# Patient Record
Sex: Female | Born: 1935 | Race: White | Hispanic: No
Health system: Southern US, Community
[De-identification: ages and names within clinical notes are randomized; demographics above are authoritative.]

## PROBLEM LIST (undated history)

## (undated) DIAGNOSIS — E1142 Type 2 diabetes mellitus with diabetic polyneuropathy: Secondary | ICD-10-CM

## (undated) DIAGNOSIS — K626 Ulcer of anus and rectum: Secondary | ICD-10-CM

## (undated) DIAGNOSIS — K635 Polyp of colon: Secondary | ICD-10-CM

## (undated) DIAGNOSIS — K5730 Diverticulosis of large intestine without perforation or abscess without bleeding: Secondary | ICD-10-CM

## (undated) DIAGNOSIS — B079 Viral wart, unspecified: Secondary | ICD-10-CM

## (undated) DIAGNOSIS — N393 Stress incontinence (female) (male): Secondary | ICD-10-CM

## (undated) DIAGNOSIS — R413 Other amnesia: Secondary | ICD-10-CM

## (undated) DIAGNOSIS — F411 Generalized anxiety disorder: Secondary | ICD-10-CM

## (undated) DIAGNOSIS — E1149 Type 2 diabetes mellitus with other diabetic neurological complication: Secondary | ICD-10-CM

## (undated) DIAGNOSIS — J309 Allergic rhinitis, unspecified: Secondary | ICD-10-CM

## (undated) DIAGNOSIS — N3010 Interstitial cystitis (chronic) without hematuria: Secondary | ICD-10-CM

## (undated) DIAGNOSIS — G4700 Insomnia, unspecified: Secondary | ICD-10-CM

## (undated) DIAGNOSIS — K449 Diaphragmatic hernia without obstruction or gangrene: Secondary | ICD-10-CM

## (undated) DIAGNOSIS — M1990 Unspecified osteoarthritis, unspecified site: Secondary | ICD-10-CM

## (undated) DIAGNOSIS — C4490 Unspecified malignant neoplasm of skin, unspecified: Secondary | ICD-10-CM

## (undated) DIAGNOSIS — N950 Postmenopausal bleeding: Secondary | ICD-10-CM

## (undated) DIAGNOSIS — I10 Essential (primary) hypertension: Secondary | ICD-10-CM

## (undated) DIAGNOSIS — E785 Hyperlipidemia, unspecified: Secondary | ICD-10-CM

## (undated) DIAGNOSIS — K589 Irritable bowel syndrome without diarrhea: Secondary | ICD-10-CM

## (undated) DIAGNOSIS — R809 Proteinuria, unspecified: Secondary | ICD-10-CM

## (undated) DIAGNOSIS — I4949 Other premature depolarization: Secondary | ICD-10-CM

## (undated) DIAGNOSIS — I639 Cerebral infarction, unspecified: Secondary | ICD-10-CM

## (undated) HISTORY — DX: Type 2 diabetes mellitus with other diabetic neurological complication: E11.49

## (undated) HISTORY — DX: Insomnia, unspecified: G47.00

## (undated) HISTORY — DX: Type 2 diabetes mellitus with diabetic polyneuropathy: E11.42

## (undated) HISTORY — DX: Essential (primary) hypertension: I10

## (undated) HISTORY — DX: Interstitial cystitis (chronic) without hematuria: N30.10

## (undated) HISTORY — DX: Other amnesia: R41.3

## (undated) HISTORY — DX: Generalized anxiety disorder: F41.1

## (undated) HISTORY — DX: Proteinuria, unspecified: R80.9

## (undated) HISTORY — DX: Unspecified malignant neoplasm of skin, unspecified: C44.90

## (undated) HISTORY — DX: Unspecified osteoarthritis, unspecified site: M19.90

## (undated) HISTORY — DX: Allergic rhinitis, unspecified: J30.9

## (undated) HISTORY — DX: Viral wart, unspecified: B07.9

## (undated) HISTORY — DX: Diverticulosis of large intestine without perforation or abscess without bleeding: K57.30

## (undated) HISTORY — DX: Stress incontinence (female) (male): N39.3

## (undated) HISTORY — DX: Postmenopausal bleeding: N95.0

## (undated) HISTORY — DX: Polyp of colon: K63.5

## (undated) HISTORY — DX: Diaphragmatic hernia without obstruction or gangrene: K44.9

## (undated) HISTORY — DX: Hyperlipidemia, unspecified: E78.5

## (undated) HISTORY — DX: Irritable bowel syndrome, unspecified: K58.9

## (undated) HISTORY — DX: Ulcer of anus and rectum: K62.6

## (undated) HISTORY — DX: Other premature depolarization: I49.49

## (undated) HISTORY — PX: TRIGGER FINGER RELEASE: SHX641

## (undated) HISTORY — PX: OTHER SURGICAL HISTORY: SHX169

## (undated) HISTORY — DX: Cerebral infarction, unspecified: I63.9

---

## 1963-04-02 HISTORY — PX: APPENDECTOMY: SHX54

## 1975-04-02 HISTORY — PX: ABDOMINAL HYSTERECTOMY: SHX81

## 1985-04-01 HISTORY — PX: CYSTOSCOPY: SUR368

## 1988-04-01 HISTORY — PX: TRIGGER FINGER RELEASE: SHX641

## 1998-02-01 ENCOUNTER — Ambulatory Visit (HOSPITAL_COMMUNITY): Admission: RE | Admit: 1998-02-01 | Discharge: 1998-02-01 | Payer: Self-pay | Admitting: Gynecology

## 1998-02-01 ENCOUNTER — Encounter: Payer: Self-pay | Admitting: Gynecology

## 1998-02-06 ENCOUNTER — Ambulatory Visit (HOSPITAL_COMMUNITY): Admission: RE | Admit: 1998-02-06 | Discharge: 1998-02-06 | Payer: Self-pay | Admitting: Gynecology

## 1998-02-06 ENCOUNTER — Encounter: Payer: Self-pay | Admitting: Gynecology

## 1999-04-10 ENCOUNTER — Other Ambulatory Visit: Admission: RE | Admit: 1999-04-10 | Discharge: 1999-04-10 | Payer: Self-pay | Admitting: Gynecology

## 1999-12-25 ENCOUNTER — Encounter: Admission: RE | Admit: 1999-12-25 | Discharge: 1999-12-25 | Payer: Self-pay | Admitting: Gynecology

## 1999-12-25 ENCOUNTER — Encounter: Payer: Self-pay | Admitting: Gynecology

## 2000-01-13 ENCOUNTER — Encounter: Payer: Self-pay | Admitting: Emergency Medicine

## 2000-01-13 ENCOUNTER — Inpatient Hospital Stay (HOSPITAL_COMMUNITY): Admission: EM | Admit: 2000-01-13 | Discharge: 2000-01-15 | Payer: Self-pay | Admitting: Emergency Medicine

## 2000-01-14 ENCOUNTER — Encounter: Payer: Self-pay | Admitting: *Deleted

## 2000-04-11 ENCOUNTER — Other Ambulatory Visit: Admission: RE | Admit: 2000-04-11 | Discharge: 2000-04-11 | Payer: Self-pay | Admitting: Gynecology

## 2001-01-08 ENCOUNTER — Ambulatory Visit (HOSPITAL_COMMUNITY): Admission: RE | Admit: 2001-01-08 | Discharge: 2001-01-08 | Payer: Self-pay | Admitting: Gynecology

## 2001-01-08 ENCOUNTER — Encounter: Payer: Self-pay | Admitting: Gynecology

## 2001-06-02 ENCOUNTER — Other Ambulatory Visit: Admission: RE | Admit: 2001-06-02 | Discharge: 2001-06-02 | Payer: Self-pay | Admitting: Gynecology

## 2001-08-20 ENCOUNTER — Ambulatory Visit: Admission: RE | Admit: 2001-08-20 | Discharge: 2001-08-20 | Payer: Self-pay | Admitting: Internal Medicine

## 2002-06-29 ENCOUNTER — Encounter: Payer: Self-pay | Admitting: Gynecology

## 2002-06-29 ENCOUNTER — Ambulatory Visit (HOSPITAL_COMMUNITY): Admission: RE | Admit: 2002-06-29 | Discharge: 2002-06-29 | Payer: Self-pay | Admitting: Gynecology

## 2003-12-21 ENCOUNTER — Ambulatory Visit (HOSPITAL_COMMUNITY): Admission: RE | Admit: 2003-12-21 | Discharge: 2003-12-21 | Payer: Self-pay | Admitting: Gynecology

## 2004-02-15 ENCOUNTER — Other Ambulatory Visit: Admission: RE | Admit: 2004-02-15 | Discharge: 2004-02-15 | Payer: Self-pay | Admitting: Gynecology

## 2004-07-17 ENCOUNTER — Ambulatory Visit (HOSPITAL_COMMUNITY): Admission: RE | Admit: 2004-07-17 | Discharge: 2004-07-17 | Payer: Self-pay | Admitting: Gastroenterology

## 2004-07-17 HISTORY — PX: COLONOSCOPY: SHX5424

## 2005-07-16 ENCOUNTER — Ambulatory Visit (HOSPITAL_COMMUNITY): Admission: RE | Admit: 2005-07-16 | Discharge: 2005-07-16 | Payer: Self-pay | Admitting: Gynecology

## 2006-02-10 HISTORY — PX: CATARACT EXTRACTION, BILATERAL: SHX1313

## 2007-01-14 ENCOUNTER — Ambulatory Visit (HOSPITAL_COMMUNITY): Admission: RE | Admit: 2007-01-14 | Discharge: 2007-01-14 | Payer: Self-pay | Admitting: Gynecology

## 2008-02-03 ENCOUNTER — Ambulatory Visit (HOSPITAL_COMMUNITY): Admission: RE | Admit: 2008-02-03 | Discharge: 2008-02-03 | Payer: Self-pay | Admitting: Gynecology

## 2009-03-09 ENCOUNTER — Ambulatory Visit (HOSPITAL_COMMUNITY): Admission: RE | Admit: 2009-03-09 | Discharge: 2009-03-09 | Payer: Self-pay | Admitting: Gynecology

## 2009-05-17 ENCOUNTER — Encounter: Admission: RE | Admit: 2009-05-17 | Discharge: 2009-05-17 | Payer: Self-pay | Admitting: Internal Medicine

## 2009-07-24 LAB — HM DEXA SCAN

## 2010-03-03 LAB — HM MAMMOGRAPHY: HM Mammogram: NEGATIVE

## 2010-04-09 ENCOUNTER — Ambulatory Visit (HOSPITAL_COMMUNITY)
Admission: RE | Admit: 2010-04-09 | Discharge: 2010-04-09 | Payer: Self-pay | Source: Home / Self Care | Attending: Gynecology | Admitting: Gynecology

## 2010-08-17 NOTE — Discharge Summary (Signed)
[place]  Patient:    Penny Miller, Penny Miller                       MRN: [id number] Adm. Date:  [id number] Disc. Date: [id number] Attending:  Cyndee Brightly Dictator:   Gildardo Griffes. Rogue Bussing, N.P. CC:         Orson Eva, M.D., neurology  Viviann Spare. Nyoka Cowden, M.D., Int. Med., [facility]   Discharge Summary  DATE OF BIRTH:  [birth year].  HISTORY OF PRESENT ILLNESS:  This 77 year old elderly female resides at home and is followed in primary care by Viviann Spare. Nyoka Cowden, M.D.  She was admitted with signs and symptoms of TIA.  The patient states she has a history of migraine headaches with aura presentation.  She initially thought she was having a migraine, seeing some "wiggly lines" and colors.  The patient states she realized that this was different when she experienced loss of vision in her left eye.  She also had numbness and loss of sensation to the left hand. The patient did take her regular Inderal 10 mg when she first noted visual disturbances.  The patient had previously been on Norvasc 5 mg daily but this was stopped approximately six months ago as her blood pressure had been low. The patient states she developed no headache and had no chest pain.  Also no shortness of breath.  There was no nausea or vomiting.  No incontinence of urine or stool.  ALLERGIES:   CODEINE and CIPRO.  VACCINES:  The patient had flu shot on the day of admission.  PAST MEDICAL HISTORY:  1. Hypertension.  2. Sinus headaches as youth.  3. Diagnosed migraine headaches at age 89 approximately.  This presentation     with aura.  4. Cystitis.  5. Hyperlipidemia.  6. Tobacco abuse.  7. Hysterectomy and bilateral oophorectomy for endometriosis in 1977.  8. Appendectomy.  9. Right knee arthroscopic surgery. 10. Tonsillectomy. 11. History of floaters.  MEDICATIONS ON ADMISSION: 1. Inderal 10 mg twice daily. 2. Norvasc discontinued approximately six  months ago. 3. Premarin 0.625 mg daily. 4. Lipitor 10 mg daily.  FAMILY HISTORY:  This was not obtained at the time of admission.  SOCIAL HISTORY:  The patient is married and her husband is often ill.  The patient is a retired Research scientist (physical sciences).  She smokes approximately one pack daily. No alcohol abuse.  REVIEW OF SYSTEMS:  As under history of present illness.  The patient states she has had increased anxiety over recent stressors.  Some sleep disturbances.  PHYSICAL EXAMINATION:  VITAL SIGNS:  Admission temperature afebrile.  Pulse 70, respiratory rate 14, blood pressure 180/92.  She was given clonidine with blood pressure reduced to 140/67.  HEENT:  Head was normocephalic and atraumatic.  No fascial asymmetry noted. Eyes:  PERRLA with extraocular movements intact.  NECK:  No jugular venous distension.  LUNGS:  Breath sounds are clear and equal.  CARDIOVASCULAR:  Regular rate and rhythm without murmur.  ABDOMEN:  There are positive bowel sounds with no palpable masses.  No pain.  BACK:  There is no CVA tenderness.  EXTREMITIES:  There is no clubbing, cyanosis, or edema.  SKIN:  There are some age spots visualized.  NEUROLOGICAL:  Strength 5/5 and equal in all extremities.  Sensation intact to left upper and lower extremities as well as left face.  ADMISSION LABS:  Blood gas January 12, 2000, revealed pH 7.389, pCO2  42.7, bicarb 26.0, total CO2 27.0.  CBC on January 12, 2000, revealed wbc elevated 13.3, rbc 4.45, hemoglobin 14.8, hematocrit 41.2, platelets 227.  Differential was unremarkable. Coagulation studies revealed PT 12.3, INR 0.9, PTT 30. Metabolic panel found all values normal.  Lipid profile found triglyceride high at 237 and all other values normal.  Urine culture was obtained at the time of admission.  CT scan of the head was negative for acute abnormalities.  HOSPITAL COURSE: The patient was placed on a heparin drip empirically.  She was seen in neurology  consult.  They agreed with heparin and recommended MIR/MRA of the brain as well as carotid Doppler study.  Carotid Doppler was done on January 14, 2000, finding no ICA stenosis bilaterally and vertebral artery flow antegrade bilaterally.  MRI of the brain indicated mild chronic ischemic changes in the white matter with no acute findings.  MRA found no intracranial stenosis.  Final neurology note recommends staying on Norvasc, antihypertensives, and initiating aspirin therapy.  At discharge, blood pressure was quite stable at 118/62.  CONDITION ON DISCHARGE:  Stable.  All above mentioned neurologic abnormalities seen on presentation per patient report have resolved.  DISCHARGE LABS:  PTT elevated 66 on heparin protocol.  CBC found wbc 10.3, hemoglobin 13.0, hematocrit 37.3, platelets 189.  Sed rate was normal at 1. Lipid panel revealed total cholesterol 181, triglycerides high at 237, HDL 49, LDL 85.  Urine culture reports no specimen.  DISCHARGE MEDICATIONS: 1. Norvasc 5 mg daily. 2. Lipitor 10 mg daily. 3. Premarin 0.625 mg daily. 4. Inderal 10 mg twice daily. 5. Enteric coated aspirin 325 mg daily.  DISCHARGE DIAGNOSES: 1. Neurologic changes including left eye blindness and decreased sensation    and function left hand and face secondary to either transient ischemic    attack or a presentation premigraine resolved. 2. Hypertension. 3. Hyperlipidemia, stable.  SPECIAL DISCHARGE INSTRUCTIONS:  The patient will require follow-up appointment with Dr. Jeanmarie Hubert two to four weeks post discharge.  Should have reevaluation of blood pressure control at that time. DD:  01/15/00 TD:  01/15/00 Job: [id number]

## 2010-08-17 NOTE — Op Note (Signed)
Penny Miller, Penny Miller                ACCOUNT NO.:  [id number]   MEDICAL RECORD NO.:  [id number]          PATIENT TYPE:  AMB   LOCATION:  ENDO                         FACILITY:  [facility]   PHYSICIAN:  Ronald Lobo, M.D.   DATE OF BIRTH:  10/09/1931   DATE OF PROCEDURE:  07/17/2004  DATE OF DISCHARGE:                                 OPERATIVE REPORT   PROCEDURE:  Colonoscopy with polypectomy.   INDICATIONS:  77 year old female for colon cancer screening. No worrisome  risk factors or symptoms. Colonoscopy 10 years ago was negative.   FINDINGS:  Small sigmoid polyp removed. Sigmoid diverticulosis and fixation.   PROCEDURE:  The purpose, risks and alternatives of the procedure had been  discussed with the patient who provided written consent.   SEDATION:  Prior to and during the course the procedure was fentanyl 100 mcg  and Versed 10 milligrams IV. There was transient bradycardia down to 46  which was just a momentary. Most of the time she was around 60 or so. There  was transient desaturation down to 85% which responded to repositioning of  her chin. There was no clinical instability during the course of procedure.   The Olympus adjustable tension pediatric video colonoscope was advanced with  moderate difficulty through a fixated angulated sigmoid region associated  with diverticular disease and probably adhesions from prior pelvic surgery.  It was a little bit easier to negotiate this with the patient in the supine  position.   I encountered a 4 x 3-mm sessile polyp in the mid sigmoid region which was  removed by cold snare technique but the polyp was not successfully retrieved  and was lost despite suctioning, irrigation, further suctioning, straining  fluid etc.  It was never recovered during the course of the exam. The polyp  had entirely benign appearance. It appeared the polyp was snared off in its  entirety. There was no problem with bleeding.   The patient had moderate  sigmoid diverticulosis. I did not see diverticular  change elsewhere in the colon.   No large polyps, cancer, colitis or vascular malformations were noted.  Retroflexion in the rectum was unremarkable. The rectosigmoid area was  reinspected because I pulled the scope out after removing the polyp in the  hope of locating it distally and/or suctioning it through the scope, after  which the patient was recolonoscoped to the cecum which was identified by  visualization of the appendiceal orifice and ileocecal valve, with photo  documentation. The quality of prep on this exam was excellent so it is felt  that all areas were well seen.   The patient tolerated the procedure well and there were no apparent  complications.   IMPRESSION:  1.  Sigmoid polyp removed but not retrieved for histologic analysis, as      described above (211.3)  2.  Sigmoid diverticulosis and fixation.   PLAN:  Since the histologic character of the polyp is not known, I would  favor approaching it as though it were an adenoma, and therefore I would  favor repeat colonoscopy in 5 years. We will  put the patient on our follow-  up list for that purpose.      RB/MEDQ  D:  07/17/2004  T:  07/17/2004  Job:  [id number]   cc:   Viviann Spare. Nyoka Cowden, M.D.  [address]  [place]  [place] [address]  Fax: [phone]   Selinda Orion, M.D.  [address]  [place] [address]  Fax: [phone]

## 2011-04-29 ENCOUNTER — Other Ambulatory Visit (HOSPITAL_COMMUNITY): Payer: Self-pay | Admitting: Gynecology

## 2011-04-29 DIAGNOSIS — Z1231 Encounter for screening mammogram for malignant neoplasm of breast: Secondary | ICD-10-CM

## 2011-05-27 DIAGNOSIS — E785 Hyperlipidemia, unspecified: Secondary | ICD-10-CM | POA: Diagnosis not present

## 2011-05-27 DIAGNOSIS — E119 Type 2 diabetes mellitus without complications: Secondary | ICD-10-CM | POA: Diagnosis not present

## 2011-05-29 DIAGNOSIS — R300 Dysuria: Secondary | ICD-10-CM | POA: Diagnosis not present

## 2011-05-29 DIAGNOSIS — I10 Essential (primary) hypertension: Secondary | ICD-10-CM | POA: Diagnosis not present

## 2011-05-29 DIAGNOSIS — E785 Hyperlipidemia, unspecified: Secondary | ICD-10-CM | POA: Diagnosis not present

## 2011-05-29 DIAGNOSIS — E119 Type 2 diabetes mellitus without complications: Secondary | ICD-10-CM | POA: Diagnosis not present

## 2011-05-31 DIAGNOSIS — R300 Dysuria: Secondary | ICD-10-CM | POA: Diagnosis not present

## 2011-05-31 DIAGNOSIS — N390 Urinary tract infection, site not specified: Secondary | ICD-10-CM | POA: Diagnosis not present

## 2011-06-03 ENCOUNTER — Ambulatory Visit (HOSPITAL_COMMUNITY)
Admission: RE | Admit: 2011-06-03 | Discharge: 2011-06-03 | Disposition: A | Discharge status: Home / Self Care | Payer: Medicare Other | Source: Ambulatory Visit | Attending: Gynecology | Admitting: Gynecology

## 2011-06-03 DIAGNOSIS — Z1231 Encounter for screening mammogram for malignant neoplasm of breast: Secondary | ICD-10-CM | POA: Diagnosis not present

## 2011-06-12 DIAGNOSIS — M79609 Pain in unspecified limb: Secondary | ICD-10-CM | POA: Diagnosis not present

## 2011-06-12 DIAGNOSIS — B351 Tinea unguium: Secondary | ICD-10-CM | POA: Diagnosis not present

## 2011-07-22 DIAGNOSIS — J209 Acute bronchitis, unspecified: Secondary | ICD-10-CM | POA: Diagnosis not present

## 2011-08-12 DIAGNOSIS — E119 Type 2 diabetes mellitus without complications: Secondary | ICD-10-CM | POA: Diagnosis not present

## 2011-08-12 DIAGNOSIS — R350 Frequency of micturition: Secondary | ICD-10-CM | POA: Diagnosis not present

## 2011-08-12 DIAGNOSIS — N8184 Pelvic muscle wasting: Secondary | ICD-10-CM | POA: Diagnosis not present

## 2011-08-12 DIAGNOSIS — Z01419 Encounter for gynecological examination (general) (routine) without abnormal findings: Secondary | ICD-10-CM | POA: Diagnosis not present

## 2011-09-13 DIAGNOSIS — M79609 Pain in unspecified limb: Secondary | ICD-10-CM | POA: Diagnosis not present

## 2011-09-13 DIAGNOSIS — B351 Tinea unguium: Secondary | ICD-10-CM | POA: Diagnosis not present

## 2011-09-30 DIAGNOSIS — I10 Essential (primary) hypertension: Secondary | ICD-10-CM | POA: Diagnosis not present

## 2011-09-30 DIAGNOSIS — E119 Type 2 diabetes mellitus without complications: Secondary | ICD-10-CM | POA: Diagnosis not present

## 2011-10-02 DIAGNOSIS — E119 Type 2 diabetes mellitus without complications: Secondary | ICD-10-CM | POA: Diagnosis not present

## 2011-10-02 DIAGNOSIS — F411 Generalized anxiety disorder: Secondary | ICD-10-CM | POA: Diagnosis not present

## 2011-10-02 DIAGNOSIS — I10 Essential (primary) hypertension: Secondary | ICD-10-CM | POA: Diagnosis not present

## 2011-10-02 DIAGNOSIS — E559 Vitamin D deficiency, unspecified: Secondary | ICD-10-CM | POA: Diagnosis not present

## 2011-12-18 DIAGNOSIS — B351 Tinea unguium: Secondary | ICD-10-CM | POA: Diagnosis not present

## 2011-12-18 DIAGNOSIS — M79609 Pain in unspecified limb: Secondary | ICD-10-CM | POA: Diagnosis not present

## 2011-12-31 DIAGNOSIS — E1139 Type 2 diabetes mellitus with other diabetic ophthalmic complication: Secondary | ICD-10-CM | POA: Diagnosis not present

## 2012-01-22 DIAGNOSIS — Z23 Encounter for immunization: Secondary | ICD-10-CM | POA: Diagnosis not present

## 2012-01-31 DIAGNOSIS — E559 Vitamin D deficiency, unspecified: Secondary | ICD-10-CM | POA: Diagnosis not present

## 2012-01-31 DIAGNOSIS — R5383 Other fatigue: Secondary | ICD-10-CM | POA: Diagnosis not present

## 2012-01-31 DIAGNOSIS — E785 Hyperlipidemia, unspecified: Secondary | ICD-10-CM | POA: Diagnosis not present

## 2012-01-31 DIAGNOSIS — I10 Essential (primary) hypertension: Secondary | ICD-10-CM | POA: Diagnosis not present

## 2012-01-31 DIAGNOSIS — Z79899 Other long term (current) drug therapy: Secondary | ICD-10-CM | POA: Diagnosis not present

## 2012-01-31 DIAGNOSIS — E119 Type 2 diabetes mellitus without complications: Secondary | ICD-10-CM | POA: Diagnosis not present

## 2012-02-04 DIAGNOSIS — E785 Hyperlipidemia, unspecified: Secondary | ICD-10-CM | POA: Diagnosis not present

## 2012-02-04 DIAGNOSIS — M779 Enthesopathy, unspecified: Secondary | ICD-10-CM | POA: Diagnosis not present

## 2012-02-04 DIAGNOSIS — E119 Type 2 diabetes mellitus without complications: Secondary | ICD-10-CM | POA: Diagnosis not present

## 2012-02-04 DIAGNOSIS — L909 Atrophic disorder of skin, unspecified: Secondary | ICD-10-CM | POA: Diagnosis not present

## 2012-02-04 DIAGNOSIS — I10 Essential (primary) hypertension: Secondary | ICD-10-CM | POA: Diagnosis not present

## 2012-03-18 DIAGNOSIS — M79609 Pain in unspecified limb: Secondary | ICD-10-CM | POA: Diagnosis not present

## 2012-03-18 DIAGNOSIS — B351 Tinea unguium: Secondary | ICD-10-CM | POA: Diagnosis not present

## 2012-06-01 DIAGNOSIS — Z79899 Other long term (current) drug therapy: Secondary | ICD-10-CM | POA: Diagnosis not present

## 2012-06-01 DIAGNOSIS — R5383 Other fatigue: Secondary | ICD-10-CM | POA: Diagnosis not present

## 2012-06-01 DIAGNOSIS — E785 Hyperlipidemia, unspecified: Secondary | ICD-10-CM | POA: Diagnosis not present

## 2012-06-01 DIAGNOSIS — E559 Vitamin D deficiency, unspecified: Secondary | ICD-10-CM | POA: Diagnosis not present

## 2012-06-01 DIAGNOSIS — E119 Type 2 diabetes mellitus without complications: Secondary | ICD-10-CM | POA: Diagnosis not present

## 2012-06-01 DIAGNOSIS — I10 Essential (primary) hypertension: Secondary | ICD-10-CM | POA: Diagnosis not present

## 2012-06-03 DIAGNOSIS — H6120 Impacted cerumen, unspecified ear: Secondary | ICD-10-CM | POA: Diagnosis not present

## 2012-06-03 DIAGNOSIS — N390 Urinary tract infection, site not specified: Secondary | ICD-10-CM | POA: Diagnosis not present

## 2012-06-03 DIAGNOSIS — E785 Hyperlipidemia, unspecified: Secondary | ICD-10-CM | POA: Diagnosis not present

## 2012-06-03 DIAGNOSIS — N393 Stress incontinence (female) (male): Secondary | ICD-10-CM | POA: Diagnosis not present

## 2012-06-03 DIAGNOSIS — F411 Generalized anxiety disorder: Secondary | ICD-10-CM | POA: Diagnosis not present

## 2012-06-03 DIAGNOSIS — N3010 Interstitial cystitis (chronic) without hematuria: Secondary | ICD-10-CM | POA: Diagnosis not present

## 2012-06-03 DIAGNOSIS — E119 Type 2 diabetes mellitus without complications: Secondary | ICD-10-CM | POA: Diagnosis not present

## 2012-06-03 DIAGNOSIS — I10 Essential (primary) hypertension: Secondary | ICD-10-CM | POA: Diagnosis not present

## 2012-06-03 DIAGNOSIS — E559 Vitamin D deficiency, unspecified: Secondary | ICD-10-CM | POA: Diagnosis not present

## 2012-06-03 DIAGNOSIS — E1149 Type 2 diabetes mellitus with other diabetic neurological complication: Secondary | ICD-10-CM | POA: Diagnosis not present

## 2012-07-08 DIAGNOSIS — B351 Tinea unguium: Secondary | ICD-10-CM | POA: Diagnosis not present

## 2012-07-08 DIAGNOSIS — M79609 Pain in unspecified limb: Secondary | ICD-10-CM | POA: Diagnosis not present

## 2012-08-06 ENCOUNTER — Other Ambulatory Visit: Payer: Self-pay | Admitting: *Deleted

## 2012-08-06 DIAGNOSIS — E1059 Type 1 diabetes mellitus with other circulatory complications: Secondary | ICD-10-CM

## 2012-08-06 DIAGNOSIS — E785 Hyperlipidemia, unspecified: Secondary | ICD-10-CM

## 2012-08-06 DIAGNOSIS — I10 Essential (primary) hypertension: Secondary | ICD-10-CM

## 2012-08-28 ENCOUNTER — Ambulatory Visit (INDEPENDENT_AMBULATORY_CARE_PROVIDER_SITE_OTHER): Payer: Medicare Other | Admitting: Internal Medicine

## 2012-08-28 ENCOUNTER — Encounter: Payer: Self-pay | Admitting: Internal Medicine

## 2012-08-28 ENCOUNTER — Encounter: Payer: Self-pay | Admitting: *Deleted

## 2012-08-28 VITALS — BP 146/84 | HR 53 | Temp 98.2°F | Resp 14 | Ht 67.5 in | Wt 168.8 lb

## 2012-08-28 DIAGNOSIS — N3010 Interstitial cystitis (chronic) without hematuria: Secondary | ICD-10-CM | POA: Insufficient documentation

## 2012-08-28 DIAGNOSIS — B029 Zoster without complications: Secondary | ICD-10-CM | POA: Diagnosis not present

## 2012-08-28 NOTE — Progress Notes (Signed)
Patient ID: Penny Miller, female   DOB: [birth year], 79 y.o.   MRN: [id number]  Allergies  Allergen Reactions  . Ceclor (Cefaclor)   . Codeine   . Lopid (Gemfibrozil)   . Morphine And Related   . Niacin And Related   . Relafen (Nabumetone)   . Septra (Sulfamethoxazole W-Trimethoprim)   . Tetracyclines & Related   . Toprol Xl (Metoprolol Tartrate)     Chief Complaint  Patient presents with  . Herpes Zoster    patch on back is hurting and patient thinks it may be shingles    HPI: Patient is a 79 y.o. white female seen in the office today for back pain and itching and an episode  Had back pain all last week.  Continued to stay busy and do work.  Sunday morning (5 days ago) had dysuria. Has interstitial cystitis, and takes abx (nitrofurantoin) and cream from gyn.  Thought UTI was explanation for pain in back, but continued to feel worse.  Went to [employer] yesterday, legs didn't feel right, didn't go like usual.  Back was itching and itched under shirt, was itching like a storm.  Has burning pain on back around mid/lower thoracic dermatome.  Not so painful as itchy and burning.    Review of Systems:  Review of Systems  Constitutional: Positive for malaise/fatigue. Negative for fever and chills.  Respiratory: Negative for shortness of breath.   Cardiovascular: Negative for chest pain.  Gastrointestinal: Positive for abdominal pain.       Suprapubic  Genitourinary: Positive for dysuria, urgency, frequency, hematuria and flank pain.  Musculoskeletal: Positive for myalgias and back pain.  Skin: Positive for rash.       Papulovesicular rash now dried up and excoriated over right posterior lower thoracics/rib cage  Neurological: Positive for tingling, sensory change and weakness. Negative for dizziness and headaches.       Legs felt numb  Psychiatric/Behavioral: The patient does not have insomnia.      Past Medical History  Diagnosis Date  . Type II or unspecified type diabetes  mellitus with neurological manifestations, not stated as uncontrolled(250.60)   . Polyneuropathy in diabetes(357.2)   . Postmenopausal bleeding   . Enthesopathy of unspecified site   . Dysuria   . Sacroiliitis, not elsewhere classified   . Unspecified vitamin D deficiency   . Other malaise and fatigue   . Viral warts, unspecified   . Insomnia, unspecified   . Memory loss   . Proteinuria   . Allergic rhinitis, cause unspecified   . Type II or unspecified type diabetes mellitus without mention of complication, not stated as uncontrolled   . Encounter for long-term (current) use of other medications   . Pain in joint, site unspecified   . Anxiety state, unspecified   . Other premature beats   . Unspecified essential hypertension   . Lipoma of unspecified site   . Other and unspecified hyperlipidemia   . Irritable bowel syndrome   . Female stress incontinence   . Diaphragmatic hernia without mention of obstruction or gangrene   . Diverticulosis of colon (without mention of hemorrhage)   . Chronic interstitial cystitis    Past Surgical History  Procedure Laterality Date  . Appendectomy  1965  . Abdominal hysterectomy  1977  . Cystoscopy  1987  . Trigger finger release  1990  . Eye surgery     Social History:   reports that she has quit smoking. She does not have any smokeless tobacco  history on file. She reports that she does not drink alcohol or use illicit drugs.  Family History  Problem Relation Age of Onset  . Cancer Mother     pancreatic  . Cancer Father     bladder    Medications: Patient's Medications  New Prescriptions   No medications on file  Previous Medications   ALUM & MAG HYDROXIDE-SIMETH (MAALOX PLUS) 400-400-40 MG/5ML SUSPENSION    Take by mouth every 6 (six) hours as needed for indigestion.   AMLODIPINE (NORVASC) 2.5 MG TABLET    Take one tablet twice daily for blood pressure   ASPIRIN 81 MG TABLET    Take 81 mg by mouth daily.   ATENOLOL (TENORMIN)  50 MG TABLET    Take one tablet once daily for blood pressure   ATORVASTATIN (LIPITOR) 20 MG TABLET    Take one tablet once daily for cholesterol   CALCIUM CARBONATE (TUMS EX) 750 MG CHEWABLE TABLET    Chew 1 tablet by mouth daily.   CITALOPRAM (CELEXA) 10 MG TABLET    Take 1/2 tablet by mouth for depression   ESTROGENS, CONJUGATED, (PREMARIN) 0.625 MG TABLET    Apply to area twice weekly   METFORMIN (GLUCOPHAGE) 500 MG TABLET    Take one tablet once daily for blood sugar   NAPROXEN SODIUM (ANAPROX) 220 MG TABLET    Take one tablet up to 3 times daily as needed for pain   VITAMIN D, ERGOCALCIFEROL, (DRISDOL) 50000 UNITS CAPS    Take one tablet every week for vitamin D  Modified Medications   No medications on file  Discontinued Medications   No medications on file     Physical Exam:  Filed Vitals:   08/28/12 1038  BP: 146/84  Pulse: 53  Temp: 98.2 F (36.8 C)  TempSrc: Oral  Resp: 14  Height: 5' 7.5" (1.715 m)  Weight: 168 lb 12.8 oz (76.567 kg)  Physical Exam  Constitutional: She appears well-developed and well-nourished.  HENT:  Head: Normocephalic and atraumatic.  Cardiovascular: Normal rate and regular rhythm.   Pulmonary/Chest: Effort normal and breath sounds normal.  Abdominal: Soft. Bowel sounds are normal. She exhibits no distension. There is tenderness. There is no rebound and no guarding.  Mild suprapubic  Musculoskeletal: She exhibits no tenderness.  Skin:  Excoriated, dry papulovesicular rash over lower thoracics, right posterior rib area  Psychiatric: She has a normal mood and affect.   Assessment/Plan Herpes zoster infection of thoracic region Per pt, this is her third episode of shingles.  She is well outside of the treatment window, and has little pain, just a bit of pruritis and irritation at the site so no specific treatment was initiated.  Recommended that she does get zostavax in 6 mos to help prevent another recurrence.  Chronic interstitial  cystitis Has had an acute flare that is slowly responding to the nitrofurantoin and "cream from her gyn".  Still feeling weak and tired with back pain, but it is improving.  No additional abx were prescribed.  Encouraged hydration with water and cranberry juice.     Labs/tests ordered:  None today, keep f/u with Dr. Nyoka Cowden

## 2012-08-28 NOTE — Patient Instructions (Signed)
Be sure to hydrate well when you work outside.  Drink some cranberry juice to help with your UTI.   Let us know if the shingles rash gets worse, pain becomes significant. Also, in 6 mos, you can get zostavax to help prevent further episodes.

## 2012-08-29 NOTE — Assessment & Plan Note (Addendum)
Has had an acute flare that is slowly responding to the nitrofurantoin and "cream from her gyn".  Still feeling weak and tired with back pain, but it is improving.  No additional abx were prescribed.  Encouraged hydration with water and cranberry juice.

## 2012-08-29 NOTE — Assessment & Plan Note (Signed)
Per pt, this is her third episode of shingles.  She is well outside of the treatment window, and has little pain, just a bit of pruritis and irritation at the site so no specific treatment was initiated.  Recommended that she does get zostavax in 6 mos to help prevent another recurrence.

## 2012-09-24 ENCOUNTER — Other Ambulatory Visit: Payer: Self-pay | Admitting: *Deleted

## 2012-09-24 MED ORDER — CITALOPRAM HYDROBROMIDE 10 MG PO TABS: ORAL_TABLET | ORAL | Status: DC

## 2012-09-25 ENCOUNTER — Other Ambulatory Visit: Payer: Self-pay | Admitting: *Deleted

## 2012-11-02 DIAGNOSIS — B351 Tinea unguium: Secondary | ICD-10-CM | POA: Diagnosis not present

## 2012-11-02 DIAGNOSIS — M79609 Pain in unspecified limb: Secondary | ICD-10-CM | POA: Diagnosis not present

## 2012-11-20 DIAGNOSIS — M25579 Pain in unspecified ankle and joints of unspecified foot: Secondary | ICD-10-CM | POA: Insufficient documentation

## 2012-12-10 ENCOUNTER — Other Ambulatory Visit: Payer: Medicare Other

## 2012-12-10 DIAGNOSIS — E1059 Type 1 diabetes mellitus with other circulatory complications: Secondary | ICD-10-CM

## 2012-12-10 DIAGNOSIS — E785 Hyperlipidemia, unspecified: Secondary | ICD-10-CM | POA: Diagnosis not present

## 2012-12-10 DIAGNOSIS — I10 Essential (primary) hypertension: Secondary | ICD-10-CM

## 2012-12-11 ENCOUNTER — Other Ambulatory Visit: Payer: Self-pay

## 2012-12-11 LAB — COMPREHENSIVE METABOLIC PANEL
Albumin/Globulin Ratio: 2 (ref 1.1–2.5)
Albumin: 4.2 g/dL (ref 3.5–4.8)
BUN: 16 mg/dL (ref 8–27)
CO2: 25 mmol/L (ref 18–29)
Chloride: 99 mmol/L (ref 97–108)
GFR calc Af Amer: 57 mL/min/{1.73_m2} — ABNORMAL LOW (ref >59)
Globulin, Total: 2.1 g/dL (ref 1.5–4.5)
Sodium: 139 mmol/L (ref 134–144)
Total Bilirubin: 0.3 mg/dL (ref 0.0–1.2)
Total Protein: 6.3 g/dL (ref 6.0–8.5)

## 2012-12-11 LAB — HEMOGLOBIN A1C: Est. average glucose Bld gHb Est-mCnc: 148 mg/dL

## 2012-12-15 ENCOUNTER — Encounter: Payer: Self-pay | Admitting: Internal Medicine

## 2012-12-15 ENCOUNTER — Ambulatory Visit (INDEPENDENT_AMBULATORY_CARE_PROVIDER_SITE_OTHER): Payer: Medicare Other | Admitting: Internal Medicine

## 2012-12-15 ENCOUNTER — Encounter: Payer: Self-pay | Admitting: *Deleted

## 2012-12-15 VITALS — BP 136/70 | HR 60 | Temp 98.0°F | Resp 18 | Ht 67.5 in | Wt 171.8 lb

## 2012-12-15 DIAGNOSIS — D179 Benign lipomatous neoplasm, unspecified: Secondary | ICD-10-CM | POA: Insufficient documentation

## 2012-12-15 DIAGNOSIS — E1142 Type 2 diabetes mellitus with diabetic polyneuropathy: Secondary | ICD-10-CM | POA: Insufficient documentation

## 2012-12-15 DIAGNOSIS — E785 Hyperlipidemia, unspecified: Secondary | ICD-10-CM | POA: Insufficient documentation

## 2012-12-15 DIAGNOSIS — R413 Other amnesia: Secondary | ICD-10-CM | POA: Diagnosis not present

## 2012-12-15 DIAGNOSIS — N3010 Interstitial cystitis (chronic) without hematuria: Secondary | ICD-10-CM

## 2012-12-15 DIAGNOSIS — Z23 Encounter for immunization: Secondary | ICD-10-CM | POA: Diagnosis not present

## 2012-12-15 DIAGNOSIS — E1149 Type 2 diabetes mellitus with other diabetic neurological complication: Secondary | ICD-10-CM

## 2012-12-15 DIAGNOSIS — G4700 Insomnia, unspecified: Secondary | ICD-10-CM | POA: Insufficient documentation

## 2012-12-15 DIAGNOSIS — B029 Zoster without complications: Secondary | ICD-10-CM

## 2012-12-15 DIAGNOSIS — I10 Essential (primary) hypertension: Secondary | ICD-10-CM

## 2012-12-15 NOTE — Patient Instructions (Signed)
Continue current medications. 

## 2012-12-15 NOTE — Progress Notes (Addendum)
Subjective:    Patient ID: Penny Miller, female    DOB: Nov 22, 1931, 79 y.o.   MRN: [id number]  HPI Sometimes sees blood in the urine. Has hx of interstitial cystitis. Has not had much burning. Has used macrobid.  Need for prophylactic vaccination and inoculation against influenza: will get Fluvax  Type II or unspecified type diabetes mellitus with neurological manifestations, not stated as uncontrolled(250.60): controlled  Polyneuropathy in diabetes(357.2): unchanged  Insomnia, unspecified  Unspecified essential hypertension: controlled  Lipoma of unspecified site: upper  abd and back; unchanged. Has several.  Other and unspecified hyperlipidemia: controlled  Chronic interstitial cystitis: would like to see urologist again  Herpes zoster infection of thoracic region: resolved    Current Outpatient Prescriptions on File Prior to Visit  Medication Sig Dispense Refill  . alum & mag hydroxide-simeth (MAALOX PLUS) 400-400-40 MG/5ML suspension Take by mouth every 6 (six) hours as needed for indigestion.      Marland Kitchen amLODipine (NORVASC) 2.5 MG tablet Take one tablet twice daily for blood pressure      . aspirin 81 MG tablet Take 81 mg by mouth daily.      Marland Kitchen atenolol (TENORMIN) 50 MG tablet Take one tablet once daily for blood pressure      . atorvastatin (LIPITOR) 20 MG tablet Take one tablet once daily at bedtime  for cholesterol.      . calcium carbonate (TUMS EX) 750 MG chewable tablet Chew 1 tablet by mouth daily.      . citalopram (CELEXA) 10 MG tablet Take 1/2 tablet by mouth for depression  45 tablet  3  . metFORMIN (GLUCOPHAGE) 500 MG tablet Take one tablet once daily for blood sugar      . Vitamin D, Ergocalciferol, (DRISDOL) 50000 UNITS CAPS Take one tablet every week for vitamin D       No current facility-administered medications on file prior to visit.   Active Ambulatory Problems    Diagnosis Date Noted  . Herpes zoster infection of thoracic region 08/28/2012  .  Chronic interstitial cystitis    Resolved Ambulatory Problems    Diagnosis Date Noted  . No Resolved Ambulatory Problems   Past Medical History  Diagnosis Date  . 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 Polyneuropathy in diabetes(357.2)   . Postmenopausal bleeding   . Enthesopathy of unspecified site   . Dysuria   . Sacroiliitis, not elsewhere classified   . Unspecified vitamin D deficiency   . Other malaise and fatigue   . Viral warts, unspecified   . Insomnia, unspecified   . Memory loss   . Proteinuria   . Allergic rhinitis, cause unspecified   . Type II or unspecified type diabetes mellitus without mention of complication, not stated as uncontrolled   . Encounter for long-term (current) use of other medications   . Pain in joint, site unspecified   . Anxiety state, unspecified   . Other premature beats   . Unspecified essential hypertension   . Lipoma of unspecified site   . Other and unspecified hyperlipidemia   . Irritable bowel syndrome   . Female stress incontinence   . Diaphragmatic hernia without mention of obstruction or gangrene   . Diverticulosis of colon (without mention of hemorrhage)    Past Surgical History  Procedure Laterality Date  . Appendectomy  1965  . Abdominal hysterectomy  1977    Dr.Hambright  . Cystoscopy  1987  Dr.Bradley  . Trigger finger release  1990    Dr.Carter  . Cataract extraction, bilateral  02/10/2006    Dr.Groat     Family Status  Relation Status Death Age  . Mother Deceased   . Father Deceased   . Sister Alive   . Brother Deceased     Accidental  . Son Alive   . Brother Deceased   . Brother Deceased   . Brother Deceased    Family History  Problem Relation Age of Onset  . Cancer Mother     pancreatic  . Cancer Father     bladder  . Diabetes Brother   . Kidney cancer Brother   . Hypertension Brother   . Diabetes Brother   . Heart  disease Brother     Myocardial Infarction   . Hypertension Sister   . Diabetes Sister    History   Social History  . Marital Status: Widowed    Spouse Name: N/A    Number of Children: N/A  . Years of Education: N/A   Occupational History  . 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 Smoking status: Former Research scientist (life sciences)  . Smokeless tobacco: None  . Alcohol Use: No  . Drug Use: No  . Sexual Activity: None   Other Topics Concern  . None   Social History Narrative   Dermatologist- Dr.Nolan   Neurologist-Dr.Love   Ophthalmologist-Dr.Gould and [place]   Education officer, environmental- Dr.Price   Orthopedic-Dr.Carter   Podiatrist- [place]   Gastroenterologist- [place]- Dr.Lomax   Dermatologist- Dr.Lupton   Urologist- Dr. Lawerance Bach           . Review of Systems ROS- DATA OBTAINED from patient. GENERAL: No change in appetite. No change in weight. No complaints of feeling fatigued. SKIN:   Skin is not itchy, has no rash, no sores, HAS DRY SKIN. EYES: Denies dryness. No complaints of the eyes itching. No eye pain. Denies change in vision.  WEARS CORRECTIVE LENSES. EARS: No earaches noted, HEARING LOSS NOTED BILATERALLY, no tinnitus. NOSE: No allergic rhinitis, no nose bleeds, no significant nasal congestion. MOUTH/THROAT:   No mouth pain.  No complaints of hoarseness, no complaints of a sore throat. RESPIRATORY: No cough. No history of dyspnea on exertion. Denies shortness of breath. No wheezing. CARDIAC: No chest pain. No edema noted. No palpitations. CHEST/BREASTS: No discharge noted from the breasts, no discomfort noted in the breasts, no breast lumps noted. GI: HAS ABDOMINAL PAIN. No complaints of diarrhea. No complaints of constipation. No complaints of heartburn/reflux. No complaints of nausea. No complaints of vomiting. Tender at lipomas of upper abd. Intermittent pain in the suprapubic and lower abd area.. GU:   HAS DYSURIA, HAS URINARY DRIBBLING, WITH AN  INCREASE IN FREQUENCY OF URINATION, NEEDS TO URINATE MORE THAN ONCE PER NIGHT. Recurrent UTI. Recent bleed that may have been of vaginal origin. MUSCULOSKELETAL: No back pain, no muscular pain noted, no muscle weakness.  NOTES JOINT PAIN, no joint swelling. NEUROLOGICAL: No loss of balance. No episodes of dizziness. No fainting. No headaches. No numbness. PSYCHIATRIC: ANXIETY PRESENT. No depressive symptoms.  HAS DIFFICULTY FALLING ASLEEP, HAS EARLY MORNING AWAKENING. No memory problems noted. ENDOCRINE: No history of excessive thirst or hunger. No history of excessive sweating. No history of excessive urination. No temperature intolerances. HEME/LYMPH: No history of bleeding easily. Does not bruise easily. IMMUNOLOGIC: No recurrent infections. No immunity disorder. No use of immunosuppressive agents.  Objective:   Physical Exam GENERAL APPEARANCE: No acute distress. SKIN:   INSPECTION: No diaphoresis. No significant skin lesions noted.  Lower midline abdominal scar noted, appendectomy scar noted.Lipomas of upper abd RASH: No rash. EYES:   CONJUNCTIVAE/LIDS:   Conjunctivae appear normal. Sclera appear normal. Eyelids normal bilaterally. PUPILS:   Pupils equal and reactive. FUNDUSCOPIC EXAM: LENS IMPLANTS IN BOTH EYES. EARS:   EXTERNAL EARS: External ear exam normal. INTERNAL EARS: CERUMEN IN LEFT EAR INCREASED WITH REDUCED VISUALIZATION.PARTIAL HEARING LOSS IN BOTH EARS. NOSE/SINUS: No deformity of the nose. No discharge from the nose. MOUTH/THROAT: Lips without lesions. No lesions noted in mouth. Tongue is without lesions. Oropharynx without redness or lesions. Inspection of the teeth is normal. NECK/THYROID: No elevation of the jugular venous pulsation. Trachea midline. No neck masses. No thyroid tenderness. No thyroid nodule. No thyroid enlargement. LYMPHATICS: No lymphadenopathy in the neck. No supraclavicular lymphadenopathy noted. No epitrochlear lymphadenopathy noted. RESPIRATORY:  Breathing is even and unlabored. Breath sounds are clear to auscultation. CHEST: Normal to inspection with no deformity. BREASTS: Normal to inspection with no deformity.  MODERATE GENERALIZED FIBROSIS AND THICKENING OF BOTH BREASTS. No breast discharge noted bilaterally. CARDIOVASCULAR:   CARDIAC:   Rhythm regular. No murmur. No extra heart sounds. R subclavian bruit  ARTERIAL: Normal pedal pulses. EDEMA/VARICOSITIES: No edema.  MODERATE VARICOSITIES BILATERALLY. GASTROINTESTINAL:   ABDOMEN: Normal bowel sounds. No masses are noted.TENDERNESS NOTED IN THE RIGHT UPPER QUADRANT. LIVER/SPLEEN/KIDNEY: No hepatomegaly or tenderness is noted. No splenomegaly. HERNIA: No abdominal wall hernia.  MUSCULOSKELETAL EXAM:   GAIT/STATION: Gait steady. HEAD: Normal to inspection and palpation. BACK: No kyphosis. No scoliosis. No spinous processes tenderness. EXTREMITIES:   LEFT UPPER EXTREMITY Full range of motion. Normal strength and tone. RIGHT UPPER EXTREMITY Full range of motion. Normal strength and tone. LEFT LOWER EXTREMITY Full range of motion. Normal strength and tone. RIGHT LOWER EXTREMITY Full range of motion. Normal strength and tone. NAILS: No clubbing of the nails. No onychomycosis. NEUROLOGIC:   CRANIAL NERVES: Cranial nerves 2-12 intact. SENSATION: REDUCED VIBRATION SENSATION IN BOTH LOWER EXTREMITIES. OTHER: No tremor noted. PSYCHIATRIC: Oriented to person, place, and time. Mood and affect appropriate.  Abstract on 12/15/2012  Component Date Value Range Status  . HM Dexa Scan 07/24/2009 Ordered by Dr.Lomax    Final  Appointment on 12/10/2012  Component Date Value Range Status  . Hemoglobin A1C 12/10/2012 6.8* 4.8 - 5.6 % Final   Comment:          Increased risk for diabetes: 5.7 - 6.4                                   Diabetes: >6.4                                   Glycemic control for adults with diabetes: <7.0  . Estimated average glucose 12/10/2012 148   Final  . Glucose  12/10/2012 118* 65 - 99 mg/dL Final  . BUN 12/10/2012 16  8 - 27 mg/dL Final  . Creatinine, Ser 12/10/2012 1.07* 0.57 - 1.00 mg/dL Final  . GFR calc non Af Amer 12/10/2012 49* >59 mL/min/1.73 Final  . GFR calc Af Amer 12/10/2012 57* >59 mL/min/1.73 Final  . BUN/Creatinine Ratio 12/10/2012 15  11 - 26 Final  . Sodium 12/10/2012 139  134 - 144 mmol/L Final  .  Potassium 12/10/2012 4.5  3.5 - 5.2 mmol/L Final  . Chloride 12/10/2012 99  97 - 108 mmol/L Final  . CO2 12/10/2012 25  18 - 29 mmol/L Final  . Calcium 12/10/2012 9.9  8.6 - 10.2 mg/dL Final  . Total Protein 12/10/2012 6.3  6.0 - 8.5 g/dL Final  . Albumin 12/10/2012 4.2  3.5 - 4.8 g/dL Final  . Globulin, Total 12/10/2012 2.1  1.5 - 4.5 g/dL Final  . Albumin/Globulin Ratio 12/10/2012 2.0  1.1 - 2.5 Final  . Total Bilirubin 12/10/2012 0.3  0.0 - 1.2 mg/dL Final  . Alkaline Phosphatase 12/10/2012 99  39 - 117 IU/L Final  . AST 12/10/2012 16  0 - 40 IU/L Final  . ALT 12/10/2012 14  0 - 32 IU/L Final  . Creatinine, Ur 12/10/2012 72.2  15.0 - 278.0 mg/dL Final  . Microalbum.,U,Random 12/10/2012 18.4* 0.0 - 17.0 ug/mL Final  . MICROALB/CREAT RATIO 12/10/2012 25.5  0.0 - 30.0 mg/g creat Final       Assessment & Plan:  Need for prophylactic vaccination and inoculation against influenza: received Fluvax  Type II or unspecified type diabetes mellitus with neurological manifestations, not stated as uncontrolled(250.60): stsable  Polyneuropathy in diabetes(357.2):unchanged  Memory loss;unchanged  Insomnia, unspecified: improved  Unspecified essential hypertension: controlled  Lipoma of unspecified site: benign and unchanged  Other and unspecified hyperlipidemia: controlled  Chronic interstitial cystitis: refer to urologist  Herpes zoster infection of thoracic region: resolved

## 2013-01-04 ENCOUNTER — Other Ambulatory Visit: Payer: Self-pay | Admitting: Urology

## 2013-01-04 DIAGNOSIS — R319 Hematuria, unspecified: Secondary | ICD-10-CM

## 2013-01-04 DIAGNOSIS — N3020 Other chronic cystitis without hematuria: Secondary | ICD-10-CM | POA: Diagnosis not present

## 2013-01-04 DIAGNOSIS — N3010 Interstitial cystitis (chronic) without hematuria: Secondary | ICD-10-CM | POA: Diagnosis not present

## 2013-01-04 DIAGNOSIS — R310 Gross hematuria: Secondary | ICD-10-CM | POA: Diagnosis not present

## 2013-01-04 DIAGNOSIS — R3989 Other symptoms and signs involving the genitourinary system: Secondary | ICD-10-CM | POA: Diagnosis not present

## 2013-01-04 DIAGNOSIS — R109 Unspecified abdominal pain: Secondary | ICD-10-CM | POA: Diagnosis not present

## 2013-01-06 ENCOUNTER — Encounter: Payer: Self-pay | Admitting: Internal Medicine

## 2013-01-06 ENCOUNTER — Ambulatory Visit
Admission: RE | Admit: 2013-01-06 | Discharge: 2013-01-06 | Disposition: A | Discharge status: Home / Self Care | Payer: Medicare Other | Source: Ambulatory Visit | Attending: Urology | Admitting: Urology

## 2013-01-06 DIAGNOSIS — R319 Hematuria, unspecified: Secondary | ICD-10-CM

## 2013-01-07 ENCOUNTER — Telehealth: Payer: Self-pay | Admitting: *Deleted

## 2013-01-07 NOTE — Telephone Encounter (Signed)
Patient dropped off a jury duty summons and wanted Dr. Nyoka Cowden to write a letter to excuse her, but Dr. Nyoka Cowden stated that patient does not need his approval because she is 57, she can handle it herself by marking the box and sending in her birth certificate or driver's license. Patient Notified.

## 2013-01-19 DIAGNOSIS — E1142 Type 2 diabetes mellitus with diabetic polyneuropathy: Secondary | ICD-10-CM | POA: Diagnosis not present

## 2013-01-19 DIAGNOSIS — R3989 Other symptoms and signs involving the genitourinary system: Secondary | ICD-10-CM | POA: Diagnosis not present

## 2013-01-19 DIAGNOSIS — N3010 Interstitial cystitis (chronic) without hematuria: Secondary | ICD-10-CM | POA: Diagnosis not present

## 2013-01-19 DIAGNOSIS — E1149 Type 2 diabetes mellitus with other diabetic neurological complication: Secondary | ICD-10-CM | POA: Diagnosis not present

## 2013-01-19 DIAGNOSIS — I10 Essential (primary) hypertension: Secondary | ICD-10-CM | POA: Diagnosis not present

## 2013-01-19 DIAGNOSIS — N3020 Other chronic cystitis without hematuria: Secondary | ICD-10-CM | POA: Diagnosis not present

## 2013-01-19 DIAGNOSIS — R413 Other amnesia: Secondary | ICD-10-CM | POA: Diagnosis not present

## 2013-01-19 DIAGNOSIS — R310 Gross hematuria: Secondary | ICD-10-CM | POA: Diagnosis not present

## 2013-01-19 DIAGNOSIS — R109 Unspecified abdominal pain: Secondary | ICD-10-CM | POA: Diagnosis not present

## 2013-01-19 DIAGNOSIS — E785 Hyperlipidemia, unspecified: Secondary | ICD-10-CM | POA: Diagnosis not present

## 2013-01-25 ENCOUNTER — Ambulatory Visit: Payer: Medicare Other | Admitting: Podiatry

## 2013-02-10 ENCOUNTER — Encounter: Payer: Self-pay | Admitting: Podiatry

## 2013-02-10 ENCOUNTER — Ambulatory Visit (INDEPENDENT_AMBULATORY_CARE_PROVIDER_SITE_OTHER): Payer: Medicare Other | Admitting: Podiatry

## 2013-02-10 VITALS — BP 120/70 | HR 69 | Resp 17 | Ht 68.0 in | Wt 170.0 lb

## 2013-02-10 DIAGNOSIS — B351 Tinea unguium: Secondary | ICD-10-CM

## 2013-02-10 DIAGNOSIS — M79609 Pain in unspecified limb: Secondary | ICD-10-CM

## 2013-02-10 NOTE — Progress Notes (Signed)
Pt presents for debridement of toenails 1-10. She is a diabetic and presents at approximately three-month intervals complaining of painful mycotic toenails.  Objective: Yellow, brittle, discolored toenails with palpable tenderness in all nail plates.  Assessment: Symptomatic onychomycoses x10 Diabetic with sensory neuropathy  Plan: All 10 toenails are debrided back without any bleeding. Reappoint at three-month intervals.

## 2013-02-22 DIAGNOSIS — N3010 Interstitial cystitis (chronic) without hematuria: Secondary | ICD-10-CM | POA: Diagnosis not present

## 2013-02-24 ENCOUNTER — Ambulatory Visit (INDEPENDENT_AMBULATORY_CARE_PROVIDER_SITE_OTHER): Payer: Medicare Other | Admitting: Nurse Practitioner

## 2013-02-24 ENCOUNTER — Encounter: Payer: Self-pay | Admitting: Nurse Practitioner

## 2013-02-24 ENCOUNTER — Ambulatory Visit
Admission: RE | Admit: 2013-02-24 | Discharge: 2013-02-24 | Disposition: A | Discharge status: Home / Self Care | Payer: Medicare Other | Source: Ambulatory Visit | Attending: Nurse Practitioner | Admitting: Nurse Practitioner

## 2013-02-24 VITALS — BP 142/78 | HR 64 | Temp 97.5°F | Resp 18 | Ht 68.0 in | Wt 169.0 lb

## 2013-02-24 DIAGNOSIS — J209 Acute bronchitis, unspecified: Secondary | ICD-10-CM | POA: Diagnosis not present

## 2013-02-24 DIAGNOSIS — R05 Cough: Secondary | ICD-10-CM | POA: Diagnosis not present

## 2013-02-24 MED ORDER — DOXYCYCLINE HYCLATE 100 MG PO TABS: 100.0000 mg | ORAL_TABLET | Freq: Two times a day (BID) | ORAL | Status: DC

## 2013-02-24 MED ORDER — ALBUTEROL SULFATE HFA 108 (90 BASE) MCG/ACT IN AERS: 2.0000 | INHALATION_SPRAY | Freq: Four times a day (QID) | RESPIRATORY_TRACT | Status: DC | PRN

## 2013-02-24 NOTE — Progress Notes (Signed)
Patient ID: Penny Miller, female   DOB: 31-Jan-1932, 79 y.o.   MRN: [id number]    Allergies  Allergen Reactions  . Ceclor [Cefaclor]   . Codeine   . Lopid [Gemfibrozil]   . Morphine And Related   . Niacin And Related   . Relafen [Nabumetone]   . Septra [Sulfamethoxazole-Trimethoprim]   . Toprol Xl [Metoprolol Tartrate]     Chief Complaint  Patient presents with  . Cough    and chest congestion, for 2 weeks, taken Mucinex DM . Coughing up yellow phlegm, feels weak    HPI: Patient is a 79 y.o. female seen in the office today for cough and congestion in chest for over 2 weeks. Taking mucinex DM for 2 weeks; this past weekend she started feeling weak overall malaise. Burning in chest; Getting nausea due to sputum taste and decrease in appetite; Has increased her fluid intake and still abt to eat; cough is keeping her up at night; no fevers or chills, some shortness of breath with cough and exertion; has wheezing  Hx of smoking Review of Systems:  Review of Systems  Constitutional: Positive for malaise/fatigue. Negative for weight loss.  HENT: Positive for congestion. Negative for ear discharge, ear pain, hearing loss and sore throat.   Respiratory: Positive for cough, sputum production (yellow), shortness of breath (at times; mostly weak) and wheezing.   Cardiovascular: Negative for chest pain, palpitations and leg swelling.  Gastrointestinal: Positive for nausea (from sputum). Negative for vomiting, diarrhea and constipation.  Skin: Negative.   Neurological: Positive for weakness. Negative for headaches.     Past Medical History  Diagnosis Date  . 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 Polyneuropathy in diabetes(357.2)   . Postmenopausal bleeding   . Enthesopathy of unspecified site   . Dysuria   . Sacroiliitis, not elsewhere classified   . Unspecified vitamin D deficiency   . Other malaise and  fatigue   . Viral warts, unspecified   . Insomnia, unspecified   . Memory loss   . Proteinuria   . Allergic rhinitis, cause unspecified   . Encounter for long-term (current) use of other medications   . Pain in joint, site unspecified   . Anxiety state, unspecified   . Other premature beats   . Unspecified essential hypertension   . Lipoma of unspecified site   . Other and unspecified hyperlipidemia   . Irritable bowel syndrome   . Female stress incontinence   . Diaphragmatic hernia without mention of obstruction or gangrene   . Diverticulosis of colon (without mention of hemorrhage)   . Chronic interstitial cystitis   . Interstitial cystitis     surgery 02/01/2013   Past Surgical History  Procedure Laterality Date  . Appendectomy  1965  . Abdominal hysterectomy  1977    Dr.Hambright  . Cystoscopy  1987    Dr.Bradley  . Trigger finger release  1990    Dr.Carter  . Cataract extraction, bilateral  02/10/2006    Dr.Groat   . Urinary bladder       surgery 02/01/2013 to stretch bladder stem   Social History:   reports that she quit smoking about 2 years ago. She has never used smokeless tobacco. She reports that she does not drink alcohol or use illicit drugs.  Family History  Problem Relation Age of Onset  . Cancer Mother     pancreatic  . Cancer Father  bladder  . Diabetes Brother   . Kidney cancer Brother   . Hypertension Brother   . Diabetes Brother   . Heart disease Brother     Myocardial Infarction   . Hypertension Sister   . Diabetes Sister     Medications: Patient's Medications  New Prescriptions   ALBUTEROL (PROVENTIL HFA;VENTOLIN HFA) 108 (90 BASE) MCG/ACT INHALER    Inhale 2 puffs into the lungs every 6 (six) hours as needed for wheezing or shortness of breath.   DOXYCYCLINE (VIBRA-TABS) 100 MG TABLET    Take 1 tablet (100 mg total) by mouth 2 (two) times daily.  Previous Medications   ALUM & MAG HYDROXIDE-SIMETH (MAALOX PLUS) 400-400-40 MG/5ML  SUSPENSION    Take by mouth every 6 (six) hours as needed for indigestion.   AMLODIPINE (NORVASC) 2.5 MG TABLET    Take one tablet twice daily for blood pressure   ASPIRIN 81 MG TABLET    Take 81 mg by mouth daily.   ATENOLOL (TENORMIN) 50 MG TABLET    Take one tablet once daily for blood pressure   ATORVASTATIN (LIPITOR) 20 MG TABLET    Take one tablet once daily at bedtime  for cholesterol.   CALCIUM CARBONATE (TUMS EX) 750 MG CHEWABLE TABLET    Chew 1 tablet by mouth daily.   CITALOPRAM (CELEXA) 10 MG TABLET    Take 1/2 tablet by mouth for depression   DEXTROMETHORPHAN-GUAIFENESIN (MUCINEX DM MAXIMUM STRENGTH PO)    Take by mouth.   METFORMIN (GLUCOPHAGE) 500 MG TABLET    Take one tablet once daily for blood sugar   NAPROXEN SODIUM (ANAPROX) 220 MG TABLET    Take 220 mg by mouth at bedtime and may repeat dose one time if needed.   NITROFURANTOIN (MACRODANTIN) 100 MG CAPSULE    Take 100 mg by mouth 2 (two) times daily. Take 1 capsule  daily to treat bladder infection.   VITAMIN D, ERGOCALCIFEROL, (DRISDOL) 50000 UNITS CAPS    Take one tablet every week for vitamin D  Modified Medications   No medications on file  Discontinued Medications   No medications on file     Physical Exam:  Filed Vitals:   02/24/13 0926  BP: 142/78  Pulse: 64  Temp: 97.5 F (36.4 C)  TempSrc: Oral  Resp: 18  Height: 5\' 8"  (1.727 m)  Weight: 169 lb (76.658 kg)  SpO2: 94%    Physical Exam  Constitutional: She is oriented to person, place, and time and well-developed, well-nourished, and in no distress. No distress.  HENT:  Head: Normocephalic and atraumatic.  Right Ear: External ear normal.  Left Ear: External ear normal.  Nose: Nose normal.  Mouth/Throat: Oropharynx is clear and moist. No oropharyngeal exudate.  Eyes: Conjunctivae and EOM are normal. Pupils are equal, round, and reactive to light.  Neck: Normal range of motion. Neck supple. No thyromegaly present.  Cardiovascular: Normal rate,  regular rhythm and normal heart sounds.   Pulmonary/Chest: Effort normal. No respiratory distress. She has wheezes (throughout with rhonchi).  Abdominal: Bowel sounds are normal.  Lymphadenopathy:    She has no cervical adenopathy.  Neurological: She is alert and oriented to person, place, and time.  Skin: Skin is warm and dry. She is not diaphoretic.  Psychiatric: Affect normal.     Labs reviewed: Basic Metabolic Panel:  Recent Labs  12/10/12 0932  NA 139  K 4.5  CL 99  CO2 25  GLUCOSE 118*  BUN 16  CREATININE 1.07*  CALCIUM 9.9   Liver Function Tests:  Recent Labs  12/10/12 0932  AST 16  ALT 14  ALKPHOS 99  BILITOT 0.3  PROT 6.3     Assessment/Plan 1. Acute bronchitis -cont to eat and drink plenty of fluids  - doxycycline (VIBRA-TABS) 100 MG tablet; Take 1 tablet (100 mg total) by mouth 2 (two) times daily.  Dispense: 20 tablet; Refill: 0 -probiotics twice daily for 10 days - DG Chest 2 View rule out pneumonia - albuterol (PROVENTIL HFA;VENTOLIN HFA) 108 (90 BASE) MCG/ACT inhaler; Inhale 2 puffs into the lungs every 6 (six) hours as needed for wheezing or shortness of breath. To take every 6 hours for first 5 days then as needed -given warning signs of when to seek emergency medicine pt understands - to follow up in 1 week

## 2013-02-24 NOTE — Patient Instructions (Addendum)
Will have you take doxycycline twice daily for 10 days Take probiotics twice daily while on antibiotics Will have you take albuterol inhaler around the clock every 6 hours while awake for 5 days then as needed  Will send for chest xray to rule out pneumonia   To follow up with me in 1 week

## 2013-03-03 ENCOUNTER — Encounter: Payer: Self-pay | Admitting: Nurse Practitioner

## 2013-03-03 ENCOUNTER — Ambulatory Visit (INDEPENDENT_AMBULATORY_CARE_PROVIDER_SITE_OTHER): Payer: Medicare Other | Admitting: Nurse Practitioner

## 2013-03-03 VITALS — BP 130/84 | HR 75 | Temp 97.7°F | Resp 16 | Wt 174.4 lb

## 2013-03-03 DIAGNOSIS — J209 Acute bronchitis, unspecified: Secondary | ICD-10-CM | POA: Diagnosis not present

## 2013-03-03 NOTE — Progress Notes (Signed)
Patient ID: Penny Miller, female   DOB: Mar 24, 1932, 79 y.o.   MRN: [id number]    Allergies  Allergen Reactions  . Ceclor [Cefaclor]   . Codeine   . Lopid [Gemfibrozil]   . Morphine And Related   . Niacin And Related   . Relafen [Nabumetone]   . Septra [Sulfamethoxazole-Trimethoprim]   . Toprol Xl [Metoprolol Tartrate]     Chief Complaint  Patient presents with  . Follow-up    1 week f/u  . other    feels much better but real weak    HPI: Patient is a 79 y.o. female seen in the office today for follow up on acute bronchitis; -Doing much better, appetite is still not good.  -reports she is still very weak -has had singles, Interstitial cystis, had to have urethra stretched  and now with bronchitis  -having to rest frequently and is not used to that -has 4 more days of doxycycline, still with minimal cough -used inhaler scheduled for the first few days and now is using it in the morning -no shortness of breath or fevers, but has weakness  Review of Systems:  Review of Systems  Constitutional: Negative for fever and chills.  HENT: Negative for congestion and sore throat.   Respiratory: Positive for cough and sputum production. Negative for shortness of breath.   Cardiovascular: Negative for chest pain and leg swelling.  Gastrointestinal: Negative for heartburn, diarrhea and constipation.  Skin: Negative.   Neurological: Positive for weakness. Negative for headaches.     Past Medical History  Diagnosis Date  . 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 Polyneuropathy in diabetes(357.2)   . Postmenopausal bleeding   . Enthesopathy of unspecified site   . Dysuria   . Sacroiliitis, not elsewhere classified   . Unspecified vitamin D deficiency   . Other malaise and fatigue   . Viral warts, unspecified   . Insomnia, unspecified   . Memory loss   . Proteinuria   . Allergic rhinitis, cause  unspecified   . Encounter for long-term (current) use of other medications   . Pain in joint, site unspecified   . Anxiety state, unspecified   . Other premature beats   . Unspecified essential hypertension   . Lipoma of unspecified site   . Other and unspecified hyperlipidemia   . Irritable bowel syndrome   . Female stress incontinence   . Diaphragmatic hernia without mention of obstruction or gangrene   . Diverticulosis of colon (without mention of hemorrhage)   . Chronic interstitial cystitis   . Interstitial cystitis     surgery 02/01/2013   Past Surgical History  Procedure Laterality Date  . Appendectomy  1965  . Abdominal hysterectomy  1977    Dr.Hambright  . Cystoscopy  1987    Dr.Bradley  . Trigger finger release  1990    Dr.Carter  . Cataract extraction, bilateral  02/10/2006    Dr.Groat   . Urinary bladder       surgery 02/01/2013 to stretch bladder stem  . Colonoscopy  07/17/2004    polypectomy   Social History:   reports that she quit smoking about 2 years ago. She has never used smokeless tobacco. She reports that she does not drink alcohol or use illicit drugs.  Family History  Problem Relation Age of Onset  . Cancer Mother     pancreatic  . Cancer Father     bladder  .  Diabetes Brother   . Kidney cancer Brother   . Hypertension Brother   . Diabetes Brother   . Heart disease Brother     Myocardial Infarction   . Hypertension Sister   . Diabetes Sister     Medications: Patient's Medications  New Prescriptions   No medications on file  Previous Medications   ALBUTEROL (PROVENTIL HFA;VENTOLIN HFA) 108 (90 BASE) MCG/ACT INHALER    Inhale 2 puffs into the lungs every 6 (six) hours as needed for wheezing or shortness of breath.   ALUM & MAG HYDROXIDE-SIMETH (MAALOX PLUS) 400-400-40 MG/5ML SUSPENSION    Take by mouth every 6 (six) hours as needed for indigestion.   AMLODIPINE (NORVASC) 2.5 MG TABLET    Take one tablet twice daily for blood pressure    ASPIRIN 81 MG TABLET    Take 81 mg by mouth daily.   ATENOLOL (TENORMIN) 50 MG TABLET    Take one tablet once daily for blood pressure   ATORVASTATIN (LIPITOR) 20 MG TABLET    Take one tablet once daily at bedtime  for cholesterol.   CALCIUM CARBONATE (TUMS EX) 750 MG CHEWABLE TABLET    Chew 1 tablet by mouth daily.   CITALOPRAM (CELEXA) 10 MG TABLET    Take 1/2 tablet by mouth for depression   DEXTROMETHORPHAN-GUAIFENESIN (MUCINEX DM MAXIMUM STRENGTH PO)    Take by mouth.   DOXYCYCLINE (VIBRA-TABS) 100 MG TABLET    Take 1 tablet (100 mg total) by mouth 2 (two) times daily.   METFORMIN (GLUCOPHAGE) 500 MG TABLET    Take one tablet once daily for blood sugar   NAPROXEN SODIUM (ANAPROX) 220 MG TABLET    Take 220 mg by mouth at bedtime and may repeat dose one time if needed.   NITROFURANTOIN (MACRODANTIN) 100 MG CAPSULE    Take 100 mg by mouth 2 (two) times daily. Take 1 capsule  daily to treat bladder infection.   VITAMIN D, ERGOCALCIFEROL, (DRISDOL) 50000 UNITS CAPS    Take one tablet every week for vitamin D  Modified Medications   No medications on file  Discontinued Medications   No medications on file     Physical Exam:  Filed Vitals:   03/03/13 1023  BP: 130/84  Pulse: 75  Temp: 97.7 F (36.5 C)  TempSrc: Oral  Resp: 16  Weight: 174 lb 6.4 oz (79.107 kg)  SpO2: 97%    Physical Exam  Constitutional: She is well-developed, well-nourished, and in no distress. No distress.  HENT:  Head: Normocephalic and atraumatic.  Right Ear: External ear normal.  Left Ear: External ear normal.  Nose: Nose normal.  Mouth/Throat: Oropharynx is clear and moist. No oropharyngeal exudate.  Eyes: Conjunctivae and EOM are normal. Pupils are equal, round, and reactive to light.  Neck: Normal range of motion. Neck supple.  Cardiovascular: Normal rate, regular rhythm and normal heart sounds.   Pulmonary/Chest: Effort normal and breath sounds normal. No respiratory distress. She has no wheezes.    Abdominal: Soft. Bowel sounds are normal. She exhibits no distension.  Lymphadenopathy:    She has no cervical adenopathy.  Neurological: She is alert.  Skin: Skin is warm and dry. She is not diaphoretic.     Labs reviewed: Basic Metabolic Panel:  Recent Labs  12/10/12 0932  NA 139  K 4.5  CL 99  CO2 25  GLUCOSE 118*  BUN 16  CREATININE 1.07*  CALCIUM 9.9   Liver Function Tests:  Recent Labs  12/10/12 0932  AST 16  ALT 14  ALKPHOS 99  BILITOT 0.3  PROT 6.3    Assessment/Plan 1. Acute bronchitis -improved -to finish course of antibiotics -cont albuterol inhaler PRN -may cont mucinex BID for cough and congestion -increase water intake, eat 3 well balance nutritional meals a day -increase activity slowly and not to overdue it -follow up as needed before routine visit

## 2013-03-18 ENCOUNTER — Other Ambulatory Visit: Payer: Self-pay | Admitting: Internal Medicine

## 2013-04-15 ENCOUNTER — Other Ambulatory Visit: Payer: Self-pay | Admitting: *Deleted

## 2013-04-15 MED ORDER — AMLODIPINE BESYLATE 2.5 MG PO TABS: ORAL_TABLET | ORAL | Status: DC

## 2013-05-12 ENCOUNTER — Ambulatory Visit (INDEPENDENT_AMBULATORY_CARE_PROVIDER_SITE_OTHER): Payer: Medicare Other | Admitting: Podiatry

## 2013-05-12 DIAGNOSIS — B351 Tinea unguium: Secondary | ICD-10-CM

## 2013-05-12 DIAGNOSIS — M79609 Pain in unspecified limb: Secondary | ICD-10-CM | POA: Diagnosis not present

## 2013-05-13 NOTE — Progress Notes (Signed)
Patient ID: Penny Miller, female   DOB: 1931-04-10, 80 y.o.   MRN: [id number]  Subjective: This orientated x3 white female presents for ongoing debridement of mycotic toenails.  Objective: Hypertrophic, elongated, discolored toenails x10  Assessment: Symptomatic onychomycoses x10  Plan: Nails x10 debrided back without a bleeding. Reappoint at three-month intervals.

## 2013-06-11 ENCOUNTER — Other Ambulatory Visit: Payer: Medicare Other

## 2013-06-11 DIAGNOSIS — E785 Hyperlipidemia, unspecified: Secondary | ICD-10-CM

## 2013-06-11 DIAGNOSIS — N3010 Interstitial cystitis (chronic) without hematuria: Secondary | ICD-10-CM | POA: Diagnosis not present

## 2013-06-11 DIAGNOSIS — E1149 Type 2 diabetes mellitus with other diabetic neurological complication: Secondary | ICD-10-CM | POA: Diagnosis not present

## 2013-06-12 LAB — LIPID PANEL
CHOLESTEROL TOTAL: 167 mg/dL (ref 100–199)
Chol/HDL Ratio: 3.9 ratio units (ref 0.0–4.4)
HDL: 43 mg/dL (ref >39)
LDL Calculated: 83 mg/dL (ref 0–99)
TRIGLYCERIDES: 207 mg/dL — AB (ref 0–149)
VLDL Cholesterol Cal: 41 mg/dL — ABNORMAL HIGH (ref 5–40)

## 2013-06-12 LAB — BASIC METABOLIC PANEL
BUN/Creatinine Ratio: 13 (ref 11–26)
BUN: 14 mg/dL (ref 8–27)
CO2: 23 mmol/L (ref 18–29)
CREATININE: 1.1 mg/dL — AB (ref 0.57–1.00)
Calcium: 9.4 mg/dL (ref 8.7–10.3)
Chloride: 100 mmol/L (ref 97–108)
GFR calc Af Amer: 55 mL/min/{1.73_m2} — ABNORMAL LOW (ref >59)
GFR, EST NON AFRICAN AMERICAN: 48 mL/min/{1.73_m2} — AB (ref >59)
GLUCOSE: 134 mg/dL — AB (ref 65–99)
Potassium: 4.4 mmol/L (ref 3.5–5.2)
SODIUM: 139 mmol/L (ref 134–144)

## 2013-06-12 LAB — HEMOGLOBIN A1C
ESTIMATED AVERAGE GLUCOSE: 143 mg/dL
HEMOGLOBIN A1C: 6.6 % — AB (ref 4.8–5.6)

## 2013-06-12 LAB — URINE CULTURE: ORGANISM ID, BACTERIA: NO GROWTH

## 2013-06-12 LAB — URINALYSIS, ROUTINE W REFLEX MICROSCOPIC
Bilirubin, UA: NEGATIVE
GLUCOSE, UA: NEGATIVE
Ketones, UA: NEGATIVE
Leukocytes, UA: NEGATIVE
Nitrite, UA: NEGATIVE
PROTEIN UA: NEGATIVE
RBC, UA: NEGATIVE
Specific Gravity, UA: 1.012 (ref 1.005–1.030)
Urobilinogen, Ur: 0.2 mg/dL (ref 0.0–1.9)
pH, UA: 7 (ref 5.0–7.5)

## 2013-06-15 ENCOUNTER — Ambulatory Visit (INDEPENDENT_AMBULATORY_CARE_PROVIDER_SITE_OTHER): Payer: Medicare Other | Admitting: Internal Medicine

## 2013-06-15 ENCOUNTER — Encounter: Payer: Self-pay | Admitting: Internal Medicine

## 2013-06-15 VITALS — BP 120/80 | HR 61 | Temp 98.9°F | Resp 18 | Ht 68.0 in | Wt 174.4 lb

## 2013-06-15 DIAGNOSIS — E1149 Type 2 diabetes mellitus with other diabetic neurological complication: Secondary | ICD-10-CM | POA: Diagnosis not present

## 2013-06-15 DIAGNOSIS — E1142 Type 2 diabetes mellitus with diabetic polyneuropathy: Secondary | ICD-10-CM

## 2013-06-15 DIAGNOSIS — G4700 Insomnia, unspecified: Secondary | ICD-10-CM | POA: Diagnosis not present

## 2013-06-15 DIAGNOSIS — E785 Hyperlipidemia, unspecified: Secondary | ICD-10-CM | POA: Diagnosis not present

## 2013-06-15 DIAGNOSIS — I10 Essential (primary) hypertension: Secondary | ICD-10-CM

## 2013-06-15 DIAGNOSIS — H9190 Unspecified hearing loss, unspecified ear: Secondary | ICD-10-CM

## 2013-06-15 NOTE — Progress Notes (Signed)
Patient ID: Penny Miller, female   DOB: 04/23/1931, 80 y.o.   MRN: [id number]    Location:    [facility]  Place of Service:  OFFICE   Allergies  Allergen Reactions  . Ceclor [Cefaclor]   . Codeine   . Lopid [Gemfibrozil]   . Morphine And Related   . Niacin And Related   . Relafen [Nabumetone]   . Septra [Sulfamethoxazole-Trimethoprim]   . Toprol Xl [Metoprolol Tartrate]     Chief Complaint  Patient presents with  . Follow-up    HPI:   Type II or unspecified type diabetes mellitus with neurological manifestations, not stated as uncontrolled(250.60); controlled  Polyneuropathy in diabetes(357.2): unchanged  Other and unspecified hyperlipidemia: controlled  Insomnia, unspecified: Sleeping better since nocturia improved. Bladder was "stretched": by Dr. Lawerance Bach at [facility].  HTN: controlled    Medications: Patient's Medications  New Prescriptions   No medications on file  Previous Medications   ALBUTEROL (PROVENTIL HFA;VENTOLIN HFA) 108 (90 BASE) MCG/ACT INHALER    Inhale 2 puffs into the lungs every 6 (six) hours as needed for wheezing or shortness of breath.   ALUM & MAG HYDROXIDE-SIMETH (MAALOX PLUS) 400-400-40 MG/5ML SUSPENSION    Take by mouth every 6 (six) hours as needed for indigestion.   AMLODIPINE (NORVASC) 2.5 MG TABLET    Take one tablet twice daily for blood pressure   ASPIRIN 81 MG TABLET    Take 81 mg by mouth daily.   ATENOLOL (TENORMIN) 50 MG TABLET    TAKE 1 TABLET DAILY TO HELPBLOOD PRESSURE   ATORVASTATIN (LIPITOR) 20 MG TABLET    Take one tablet once daily at bedtime  for cholesterol.   CALCIUM CARBONATE (TUMS EX) 750 MG CHEWABLE TABLET    Chew 1 tablet by mouth daily.   CITALOPRAM (CELEXA) 10 MG TABLET    Take 1/2 tablet by mouth for depression   DEXTROMETHORPHAN-GUAIFENESIN (MUCINEX DM MAXIMUM STRENGTH PO)    Take by mouth.   METFORMIN (GLUCOPHAGE) 500 MG TABLET    TAKE 1 TABLET EVERY MORNINGFOR BLOOD SUGAR   NAPROXEN SODIUM (ANAPROX) 220 MG  TABLET    Take 220 mg by mouth at bedtime and may repeat dose one time if needed.   NITROFURANTOIN (MACRODANTIN) 100 MG CAPSULE    Take 100 mg by mouth 2 (two) times daily. Take 1 capsule  daily to treat bladder infection.   VITAMIN D, ERGOCALCIFEROL, (DRISDOL) 50000 UNITS CAPS    Take one tablet every week for vitamin D  Modified Medications   No medications on file  Discontinued Medications   DOXYCYCLINE (VIBRA-TABS) 100 MG TABLET    Take 1 tablet (100 mg total) by mouth 2 (two) times daily.     Review of Systems  Constitutional: Positive for fatigue. Negative for fever.  HENT: Positive for hearing loss.   Eyes: Positive for visual disturbance (corrective lenses).  Respiratory: Negative for cough, choking, chest tightness and shortness of breath.   Cardiovascular: Negative for chest pain, palpitations and leg swelling.  Endocrine:       Diabetic  Genitourinary:       Dribblling. Nocturia x 3.  Musculoskeletal: Positive for arthralgias.  Skin: Negative.   Neurological: Negative for dizziness, tremors, seizures, syncope and weakness.  Hematological: Negative.   Psychiatric/Behavioral:       Insomnia. Difficulty falling asleep and early morning awakening.    Filed Vitals:   06/15/13 1445  BP: 120/80  Pulse: 61  Temp: 98.9 F (37.2 C)  TempSrc: Oral  Resp: 18  Height: 5\' 8"  (1.727 m)  Weight: 174 lb 6.4 oz (79.107 kg)  SpO2: 95%   Physical Exam  Constitutional: She is oriented to person, place, and time. She appears well-nourished. No distress.  HENT:  Head: Normocephalic and atraumatic.  Right Ear: External ear normal.  Left Ear: External ear normal.  Nose: Nose normal.  Mouth/Throat: Oropharynx is clear and moist. No oropharyngeal exudate.  Eyes: Conjunctivae and EOM are normal. Pupils are equal, round, and reactive to light.  Lens implant in both eyes.  Neck: No JVD present. No tracheal deviation present. No thyromegaly present.  Cardiovascular: Normal rate,  regular rhythm, normal heart sounds and intact distal pulses.  Exam reveals no gallop and no friction rub.   No murmur heard. Pulmonary/Chest: No respiratory distress. She has no wheezes. She has no rales. She exhibits no tenderness.  Fibrosis and thickening of both breasts  Abdominal: She exhibits no distension and no mass. There is no tenderness.  Lymphadenopathy:    She has no cervical adenopathy.  Neurological: She is alert and oriented to person, place, and time. She has normal reflexes.  Reduced vibratory sensation of both feet  Skin: No rash noted. No erythema. No pallor.  Dry  Psychiatric: She has a normal mood and affect. Her behavior is normal. Judgment and thought content normal.     Labs reviewed: Appointment on 06/11/2013  Component Date Value Ref Range Status  . Glucose 06/11/2013 134* 65 - 99 mg/dL Final  . BUN 06/11/2013 14  8 - 27 mg/dL Final  . Creatinine, Ser 06/11/2013 1.10* 0.57 - 1.00 mg/dL Final  . GFR calc non Af Amer 06/11/2013 48* >59 mL/min/1.73 Final  . GFR calc Af Amer 06/11/2013 55* >59 mL/min/1.73 Final  . BUN/Creatinine Ratio 06/11/2013 13  11 - 26 Final  . Sodium 06/11/2013 139  134 - 144 mmol/L Final  . Potassium 06/11/2013 4.4  3.5 - 5.2 mmol/L Final  . Chloride 06/11/2013 100  97 - 108 mmol/L Final  . CO2 06/11/2013 23  18 - 29 mmol/L Final  . Calcium 06/11/2013 9.4  8.7 - 10.3 mg/dL Final  . Hemoglobin A1C 06/11/2013 6.6* 4.8 - 5.6 % Final   Comment:          Increased risk for diabetes: 5.7 - 6.4                                   Diabetes: >6.4                                   Glycemic control for adults with diabetes: <7.0  . Estimated average glucose 06/11/2013 143   Final  . Cholesterol, Total 06/11/2013 167  100 - 199 mg/dL Final  . Triglycerides 06/11/2013 207* 0 - 149 mg/dL Final  . HDL 06/11/2013 43  >39 mg/dL Final   Comment: According to ATP-III Guidelines, HDL-C >59 mg/dL is considered a                          negative risk  factor for CHD.  Marland Kitchen VLDL Cholesterol Cal 06/11/2013 41* 5 - 40 mg/dL Final  . LDL Calculated 06/11/2013 83  0 - 99 mg/dL Final  . Chol/HDL Ratio 06/11/2013 3.9  0.0 - 4.4 ratio units Final  Comment:                                   T. Chol/HDL Ratio                                                                      Men  Women                                                        1/2 Avg.Risk  3.4    3.3                                                            Avg.Risk  5.0    4.4                                                         2X Avg.Risk  9.6    7.1                                                         3X Avg.Risk 23.4   11.0  . Specific Gravity, UA 06/11/2013 1.012  1.005 - 1.030 Final  . pH, UA 06/11/2013 7.0  5.0 - 7.5 Final  . Color, UA 06/11/2013 Yellow  Yellow Final  . Appearance Ur 06/11/2013 Clear  Clear Final  . Leukocytes, UA 06/11/2013 Negative  Negative Final  . Protein, UA 06/11/2013 Negative  Negative/Trace Final  . Glucose, UA 06/11/2013 Negative  Negative Final  . Ketones, UA 06/11/2013 Negative  Negative Final  . RBC, UA 06/11/2013 Negative  Negative Final  . Bilirubin, UA 06/11/2013 Negative  Negative Final  . Urobilinogen, Ur 06/11/2013 0.2  0.0 - 1.9 mg/dL Final  . Nitrite, UA 06/11/2013 Negative  Negative Final  . Microscopic Examination 06/11/2013 Comment   Final   Microscopic not indicated and not performed.  . Urine Culture, Routine 06/11/2013 Final report   Final  . Result 1 06/11/2013 No growth   Final      Assessment/Plan  1. Type II or unspecified type diabetes mellitus with neurological manifestations, not stated as uncontrolled(250.60) controlled - Hemoglobin A1c; Future - Comprehensive metabolic panel; Future - EKG 12-Lead; Future - Microalbumin / creatinine urine ratio; Future - Urinalysis; Future  2. Polyneuropathy in diabetes(357.2) unchanged  3. Other and unspecified hyperlipidemia controlled - Lipid panel; Future  4.  Insomnia, unspecified improved  5. Deaf Offered audiology referral, but she does not want at  this time  6. Unspecified essential hypertension controlled - EKG 12-Lead; Future

## 2013-06-15 NOTE — Patient Instructions (Signed)
Continue current medication.

## 2013-06-30 DIAGNOSIS — E119 Type 2 diabetes mellitus without complications: Secondary | ICD-10-CM | POA: Diagnosis not present

## 2013-06-30 DIAGNOSIS — H3530 Unspecified macular degeneration: Secondary | ICD-10-CM | POA: Diagnosis not present

## 2013-07-07 ENCOUNTER — Other Ambulatory Visit: Payer: Self-pay | Admitting: *Deleted

## 2013-07-07 MED ORDER — ATORVASTATIN CALCIUM 20 MG PO TABS: ORAL_TABLET | ORAL | Status: DC

## 2013-07-07 MED ORDER — VITAMIN D (ERGOCALCIFEROL) 1.25 MG (50000 UNIT) PO CAPS: ORAL_CAPSULE | ORAL | Status: DC

## 2013-07-07 NOTE — Telephone Encounter (Signed)
Patient request to be sent to [facility]

## 2013-07-16 ENCOUNTER — Other Ambulatory Visit: Payer: Self-pay | Admitting: *Deleted

## 2013-07-16 MED ORDER — AMLODIPINE BESYLATE 2.5 MG PO TABS: ORAL_TABLET | ORAL | Status: DC

## 2013-07-27 ENCOUNTER — Encounter: Payer: Self-pay | Admitting: Internal Medicine

## 2013-07-28 ENCOUNTER — Encounter: Payer: Self-pay | Admitting: Internal Medicine

## 2013-08-09 ENCOUNTER — Ambulatory Visit (INDEPENDENT_AMBULATORY_CARE_PROVIDER_SITE_OTHER): Payer: Medicare Other | Admitting: Podiatry

## 2013-08-09 ENCOUNTER — Encounter: Payer: Self-pay | Admitting: Podiatry

## 2013-08-09 VITALS — BP 155/57 | HR 57 | Resp 15 | Ht 68.0 in | Wt 170.0 lb

## 2013-08-09 DIAGNOSIS — B351 Tinea unguium: Secondary | ICD-10-CM

## 2013-08-09 DIAGNOSIS — M79609 Pain in unspecified limb: Secondary | ICD-10-CM | POA: Diagnosis not present

## 2013-08-09 NOTE — Progress Notes (Signed)
Patient ID: Penny Miller, female   DOB: 12-Jul-1931, 80 y.o.   MRN: [id number]  Subjective: Orientated x3 white female presents complaining of painful toenails  Objective: Discolored, elongated, incurvated toenails x10 with palpable tenderness in all the nail plates  Assessment: Symptomatic onychomycoses x10  Plan: Debrided toenails x10 without a bleeding  Reappoint at three-month intervals

## 2013-09-13 ENCOUNTER — Other Ambulatory Visit: Payer: Self-pay | Admitting: Internal Medicine

## 2013-09-13 MED ORDER — NITROFURANTOIN MACROCRYSTAL 100 MG PO CAPS: ORAL_CAPSULE | ORAL | Status: DC

## 2013-09-13 NOTE — Telephone Encounter (Signed)
Patient Requested 

## 2013-11-08 ENCOUNTER — Ambulatory Visit (INDEPENDENT_AMBULATORY_CARE_PROVIDER_SITE_OTHER): Payer: Medicare Other | Admitting: Podiatry

## 2013-11-08 ENCOUNTER — Ambulatory Visit (INDEPENDENT_AMBULATORY_CARE_PROVIDER_SITE_OTHER): Payer: Medicare Other

## 2013-11-08 ENCOUNTER — Encounter: Payer: Self-pay | Admitting: Podiatry

## 2013-11-08 VITALS — BP 146/77 | HR 55 | Resp 13 | Ht 68.0 in | Wt 170.0 lb

## 2013-11-08 DIAGNOSIS — M79609 Pain in unspecified limb: Secondary | ICD-10-CM | POA: Diagnosis not present

## 2013-11-08 DIAGNOSIS — S93409A Sprain of unspecified ligament of unspecified ankle, initial encounter: Secondary | ICD-10-CM | POA: Diagnosis not present

## 2013-11-08 DIAGNOSIS — M79673 Pain in unspecified foot: Secondary | ICD-10-CM

## 2013-11-08 DIAGNOSIS — M79662 Pain in left lower leg: Secondary | ICD-10-CM

## 2013-11-08 DIAGNOSIS — B351 Tinea unguium: Secondary | ICD-10-CM

## 2013-11-08 NOTE — Progress Notes (Signed)
   Subjective:    Patient ID: Penny Miller, female    DOB: 11-28-1931, 80 y.o.   MRN: [id number]  HPI Comments: N foot and leg pain and weakness L left lateral ankle D 11/07/2013 O stood from seated position, and leg collapsed C aching and weakness A weightbearing T no treatment attempted, pt states did get relief from bedrest  Foot Pain   This patient presents today complaining of a week wear weakness in her ankles left more than right with a history of weakness primarily in the left ankle.The Left ankle has become uncomfortable and weak in the past 24 hours. She describes this occurring multiple times in the left ankle and is requesting evaluation  In addition she is requesting debridement of mycotic toenails today   Review of Systems  All other systems reviewed and are negative.      Objective:   Physical Exam Orientated x3 white female  Vascular: DP and PT pulses 2/4 bilaterally  Neurological: Sensation to 10 g oto-wire intact 3/5 right and 4/5 left Ankle reflexes equal and reactive bilaterally Vibratory sensation nonreactive bilaterally  Dermatological: Varicose veins noted on the distal lower leg and ankles bilaterally The toenails are elongated, hypertrophic, discolored 6-10  Musculoskeletal: Palpable tenderness lateral left ankle without any palpable lesions There is no palpable tenderness in the lateral right ankle Range of motion ankles bilaterally without crepitus or pain upon range of motion  X-ray examination left ankle  Intact bony structures without fracture and/or dislocation noted  Ankle mortise within normal limits  Radiographic impression:  No acute bony abnormality noted in the left ankle       Assessment & Plan:   Assessment: Satisfactory vascular status Diabetic peripheral neuropathy Unstable left ankle Symptomatic onychomycoses 6-10  Plan: Nails x10 debrided without any bleeding Ankle stabilizer dispensed to wear and left ankle  on a continuous basis  Reappoint at three-month intervals

## 2013-11-08 NOTE — Patient Instructions (Signed)

## 2013-11-09 ENCOUNTER — Encounter: Payer: Self-pay | Admitting: Podiatry

## 2013-12-02 ENCOUNTER — Other Ambulatory Visit: Payer: Self-pay | Admitting: *Deleted

## 2013-12-03 ENCOUNTER — Other Ambulatory Visit: Payer: Self-pay | Admitting: *Deleted

## 2013-12-17 ENCOUNTER — Other Ambulatory Visit: Payer: Medicare Other

## 2013-12-17 DIAGNOSIS — E785 Hyperlipidemia, unspecified: Secondary | ICD-10-CM

## 2013-12-17 DIAGNOSIS — E1149 Type 2 diabetes mellitus with other diabetic neurological complication: Secondary | ICD-10-CM | POA: Diagnosis not present

## 2013-12-18 LAB — URINALYSIS
Bilirubin, UA: NEGATIVE
Glucose, UA: NEGATIVE
Ketones, UA: NEGATIVE
Leukocytes, UA: NEGATIVE
Nitrite, UA: NEGATIVE
PROTEIN UA: NEGATIVE
Specific Gravity, UA: 1.01 (ref 1.005–1.030)
Urobilinogen, Ur: 0.2 mg/dL (ref 0.0–1.9)
pH, UA: 7 (ref 5.0–7.5)

## 2013-12-18 LAB — MICROALBUMIN / CREATININE URINE RATIO
Creatinine, Ur: 53.6 mg/dL (ref 15.0–278.0)
MICROALB/CREAT RATIO: 56.7 mg/g creat — ABNORMAL HIGH (ref 0.0–30.0)
MICROALBUM., U, RANDOM: 30.4 ug/mL — AB (ref 0.0–17.0)

## 2013-12-18 LAB — HEMOGLOBIN A1C
Est. average glucose Bld gHb Est-mCnc: 157 mg/dL
HEMOGLOBIN A1C: 7.1 % — AB (ref 4.8–5.6)

## 2013-12-18 LAB — COMPREHENSIVE METABOLIC PANEL
ALBUMIN: 4 g/dL (ref 3.5–4.7)
ALT: 12 IU/L (ref 0–32)
AST: 12 IU/L (ref 0–40)
Albumin/Globulin Ratio: 2.2 (ref 1.1–2.5)
Alkaline Phosphatase: 89 IU/L (ref 39–117)
BILIRUBIN TOTAL: 0.4 mg/dL (ref 0.0–1.2)
BUN/Creatinine Ratio: 15 (ref 11–26)
BUN: 15 mg/dL (ref 8–27)
CO2: 23 mmol/L (ref 18–29)
CREATININE: 1.03 mg/dL — AB (ref 0.57–1.00)
Calcium: 9.7 mg/dL (ref 8.7–10.3)
Chloride: 101 mmol/L (ref 97–108)
GFR, EST AFRICAN AMERICAN: 59 mL/min/{1.73_m2} — AB (ref >59)
GFR, EST NON AFRICAN AMERICAN: 51 mL/min/{1.73_m2} — AB (ref >59)
GLOBULIN, TOTAL: 1.8 g/dL (ref 1.5–4.5)
GLUCOSE: 140 mg/dL — AB (ref 65–99)
Potassium: 4.2 mmol/L (ref 3.5–5.2)
Sodium: 140 mmol/L (ref 134–144)
TOTAL PROTEIN: 5.8 g/dL — AB (ref 6.0–8.5)

## 2013-12-18 LAB — LIPID PANEL
CHOLESTEROL TOTAL: 170 mg/dL (ref 100–199)
Chol/HDL Ratio: 4 ratio units (ref 0.0–4.4)
HDL: 43 mg/dL (ref >39)
LDL CALC: 78 mg/dL (ref 0–99)
Triglycerides: 244 mg/dL — ABNORMAL HIGH (ref 0–149)
VLDL CHOLESTEROL CAL: 49 mg/dL — AB (ref 5–40)

## 2013-12-21 ENCOUNTER — Encounter: Payer: Self-pay | Admitting: Internal Medicine

## 2013-12-21 ENCOUNTER — Ambulatory Visit (INDEPENDENT_AMBULATORY_CARE_PROVIDER_SITE_OTHER): Payer: Medicare Other | Admitting: Internal Medicine

## 2013-12-21 VITALS — BP 120/80 | HR 57 | Temp 98.1°F | Resp 10 | Ht 68.0 in | Wt 174.0 lb

## 2013-12-21 DIAGNOSIS — I10 Essential (primary) hypertension: Secondary | ICD-10-CM | POA: Diagnosis not present

## 2013-12-21 DIAGNOSIS — M25572 Pain in left ankle and joints of left foot: Secondary | ICD-10-CM

## 2013-12-21 DIAGNOSIS — M25579 Pain in unspecified ankle and joints of unspecified foot: Secondary | ICD-10-CM | POA: Diagnosis not present

## 2013-12-21 DIAGNOSIS — N3010 Interstitial cystitis (chronic) without hematuria: Secondary | ICD-10-CM

## 2013-12-21 DIAGNOSIS — E785 Hyperlipidemia, unspecified: Secondary | ICD-10-CM

## 2013-12-21 DIAGNOSIS — L821 Other seborrheic keratosis: Secondary | ICD-10-CM | POA: Insufficient documentation

## 2013-12-21 DIAGNOSIS — E1149 Type 2 diabetes mellitus with other diabetic neurological complication: Secondary | ICD-10-CM | POA: Diagnosis not present

## 2013-12-21 DIAGNOSIS — Z23 Encounter for immunization: Secondary | ICD-10-CM | POA: Diagnosis not present

## 2013-12-21 DIAGNOSIS — E1142 Type 2 diabetes mellitus with diabetic polyneuropathy: Secondary | ICD-10-CM

## 2013-12-21 DIAGNOSIS — I8390 Asymptomatic varicose veins of unspecified lower extremity: Secondary | ICD-10-CM | POA: Insufficient documentation

## 2013-12-21 DIAGNOSIS — R413 Other amnesia: Secondary | ICD-10-CM

## 2013-12-21 NOTE — Progress Notes (Signed)
Patient ID: Penny Miller, female   DOB: 1931-05-14, 80 y.o.   MRN: [id number]    Location:    [facility]  Place of Service:  OFFICE  Extended Emergency Contact Information Primary Emergency Contact: Donoghue,[name] Address: [id number], [place] Phone: [phone] Relation: Son   Chief Complaint  Patient presents with  . Annual Exam    Yearly exam, no pap, discuss labs (copy printed) .   Marland Kitchen Ankle Problem    Examine left ankle- ? ankle brace   . MMSE    28/30, passed clock drawing     HPI:    Unspecified essential hypertension -controlled  Need for prophylactic vaccination and inoculation against influenza  Pain in joint, ankle and foot, left: Left ankle pain for over a year. Saw Dr. Amalia Hailey about a month ago. Did xrays. Told her arthritis. Gave her a boot. Could not tolerate the boot after a while.  Type II or unspecified type diabetes mellitus with neurological manifestations, not stated as uncontrolled(250.60):  controlled  Other and unspecified hyperlipidemia: controlled  Polyneuropathy in diabetes(357.2): unchanged  Varicose vein of leg: unchanged  Chronic interstitial cystitis: controlled  Memory loss: mild and unchanged  Seborrheic keratosis: multiple and not irritated  Hyperlipidemia: controlled    Past Medical History  Diagnosis Date  . Type II or unspecified type diabetes mellitus with neurological manifestations, not stated as uncontrolled   . Polyneuropathy in diabetes(357.2)   . Postmenopausal bleeding   . Enthesopathy of unspecified site   . Dysuria   . Sacroiliitis, not elsewhere classified   . Unspecified vitamin D deficiency   . Other malaise and fatigue   . Viral warts, unspecified   . Insomnia, unspecified   . Memory loss   . Proteinuria   . Allergic rhinitis, cause unspecified   . Encounter for long-term (current) use of other medications   . Pain in joint, site unspecified   . Anxiety  state, unspecified   . Other premature beats   . Unspecified essential hypertension   . Lipoma of unspecified site   . Other and unspecified hyperlipidemia   . Irritable bowel syndrome   . Female stress incontinence   . Diaphragmatic hernia without mention of obstruction or gangrene   . Diverticulosis of colon (without mention of hemorrhage)   . Chronic interstitial cystitis   . Interstitial cystitis     surgery 02/01/2013    Past Surgical History  Procedure Laterality Date  . Appendectomy  1965  . Abdominal hysterectomy  1977    Dr.Hambright  . Cystoscopy  1987    Dr.Bradley  . Trigger finger release  1990    Dr.Carter  . Cataract extraction, bilateral  02/10/2006    Dr.Groat   . Urinary bladder       surgery 02/01/2013 to stretch bladder stem  . Colonoscopy  07/17/2004    polypectomy    History   Social History  . Marital Status: Widowed    Spouse Name: N/A    Number of Children: N/A  . Years of Education: N/A   Occupational History  . 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 Smoking status: Former Smoker    Quit date: 02/25/2011  . Smokeless tobacco: Never Used  . Alcohol Use: No  . Drug Use: No  . Sexual Activity: No   Other Topics Concern  . Not on  file   Social History Narrative   Dermatologist- Dr.Nolan   Neurologist-Dr.Love   Ophthalmologist-Dr.Gould and [place]   General Surgeon- [place]   Gastroenterologist- [place]- Dr.Lomax   Dermatologist- Dr.Lupton   Urologist- Dr. Lawerance Bach            reports that she quit smoking about 2 years ago. She has never used smokeless tobacco. She reports that she does not drink alcohol or use illicit drugs.  Immunization History  Administered Date(s) Administered  . Influenza,inj,Quad PF,36+ Mos 12/15/2012, 12/21/2013  . Influenza-Unspecified 01/22/2012  . Pneumococcal-Unspecified 01/24/1995  . Td 09/14/2008    Allergies    Allergen Reactions  . Ceclor [Cefaclor]   . Codeine   . Lopid [Gemfibrozil]   . Morphine And Related   . Niacin And Related   . Relafen [Nabumetone]   . Septra [Sulfamethoxazole-Trimethoprim]   . Toprol Xl [Metoprolol Tartrate]     Medications: Patient's Medications  New Prescriptions   No medications on file  Previous Medications   AMLODIPINE (NORVASC) 2.5 MG TABLET    Take one tablet twice daily for blood pressure   ASPIRIN 81 MG TABLET    Take 81 mg by mouth daily.   ATENOLOL (TENORMIN) 50 MG TABLET    TAKE 1 TABLET DAILY TO HELPBLOOD PRESSURE   ATORVASTATIN (LIPITOR) 20 MG TABLET    Take one tablet once daily at bedtime  for cholesterol.   CALCIUM CARBONATE (TUMS EX) 750 MG CHEWABLE TABLET    Chew 1 tablet by mouth daily.   CITALOPRAM (CELEXA) 10 MG TABLET    TAKE 1/2 TABLET DAILY FOR  DEPRESSION   DEXTROMETHORPHAN-GUAIFENESIN (MUCINEX DM MAXIMUM STRENGTH PO)    Take by mouth.   METFORMIN (GLUCOPHAGE) 500 MG TABLET    TAKE 1 TABLET EVERY MORNINGFOR BLOOD SUGAR   NAPROXEN SODIUM (ANAPROX) 220 MG TABLET    Take 220 mg by mouth at bedtime and may repeat dose one time if needed.   NITROFURANTOIN (MACRODANTIN) 100 MG CAPSULE    Take 1 capsule  daily to treat bladder infection.   VITAMIN D, ERGOCALCIFEROL, (DRISDOL) 50000 UNITS CAPS CAPSULE    Take one tablet every week for vitamin D  Modified Medications   No medications on file  Discontinued Medications   ALBUTEROL (PROVENTIL HFA;VENTOLIN HFA) 108 (90 BASE) MCG/ACT INHALER    Inhale 2 puffs into the lungs every 6 (six) hours as needed for wheezing or shortness of breath.   ALUM & MAG HYDROXIDE-SIMETH (MAALOX PLUS) 400-400-40 MG/5ML SUSPENSION    Take by mouth every 6 (six) hours as needed for indigestion.     Review of Systems  Constitutional: Positive for fatigue. Negative for fever.  HENT: Positive for hearing loss.   Eyes: Positive for visual disturbance (corrective lenses).  Respiratory: Negative for cough, choking, chest  tightness and shortness of breath.   Cardiovascular: Negative for chest pain, palpitations and leg swelling.  Endocrine:       Diabetic  Genitourinary:       Dribblling. Nocturia x 3.  Musculoskeletal: Positive for arthralgias.  Skin: Negative.   Neurological: Negative for dizziness, tremors, seizures, syncope and weakness.  Hematological: Negative.   Psychiatric/Behavioral:       Insomnia. Difficulty falling asleep and early morning awakening.    Filed Vitals:   12/21/13 1542  BP: 120/80  Pulse: 57  Temp: 98.1 F (36.7 C)  TempSrc: Oral  Resp: 10  Height: 5\' 8"  (1.727  m)  Weight: 174 lb (78.926 kg)  SpO2: 95%   Body mass index is 26.46 kg/(m^2).  Physical Exam  Constitutional: She is oriented to person, place, and time. She appears well-nourished. No distress.  HENT:  Head: Normocephalic and atraumatic.  Right Ear: External ear normal.  Left Ear: External ear normal.  Nose: Nose normal.  Mouth/Throat: Oropharynx is clear and moist. No oropharyngeal exudate.  Eyes: Conjunctivae and EOM are normal. Pupils are equal, round, and reactive to light.  Lens implant in both eyes.  Neck: No JVD present. No tracheal deviation present. No thyromegaly present.  Cardiovascular: Normal rate, regular rhythm, normal heart sounds and intact distal pulses.  Exam reveals no gallop and no friction rub.   No murmur heard. Pulmonary/Chest: No respiratory distress. She has no wheezes. She has no rales. She exhibits no tenderness.  Fibrosis and thickening of both breasts  Abdominal: She exhibits no distension and no mass. There is no tenderness.  Musculoskeletal: She exhibits no tenderness.  Lymphadenopathy:    She has no cervical adenopathy.  Neurological: She is alert and oriented to person, place, and time. She has normal reflexes.  Reduced vibratory sensation of both feet 12/21/13 MMSE 29/30. Passed clock drawing.  Skin: No rash noted. No erythema. No pallor.  Dry  Psychiatric: She has  a normal mood and affect. Her behavior is normal. Judgment and thought content normal.     Labs reviewed: Appointment on 12/17/2013  Component Date Value Ref Range Status  . Hemoglobin A1C 12/17/2013 7.1* 4.8 - 5.6 % Final   Comment:          Increased risk for diabetes: 5.7 - 6.4                                   Diabetes: >6.4                                   Glycemic control for adults with diabetes: <7.0  . Estimated average glucose 12/17/2013 157   Final  . Glucose 12/17/2013 140* 65 - 99 mg/dL Final  . BUN 12/17/2013 15  8 - 27 mg/dL Final  . Creatinine, Ser 12/17/2013 1.03* 0.57 - 1.00 mg/dL Final  . GFR calc non Af Amer 12/17/2013 51* >59 mL/min/1.73 Final  . GFR calc Af Amer 12/17/2013 59* >59 mL/min/1.73 Final  . BUN/Creatinine Ratio 12/17/2013 15  11 - 26 Final  . Sodium 12/17/2013 140  134 - 144 mmol/L Final  . Potassium 12/17/2013 4.2  3.5 - 5.2 mmol/L Final  . Chloride 12/17/2013 101  97 - 108 mmol/L Final  . CO2 12/17/2013 23  18 - 29 mmol/L Final  . Calcium 12/17/2013 9.7  8.7 - 10.3 mg/dL Final  . Total Protein 12/17/2013 5.8* 6.0 - 8.5 g/dL Final  . Albumin 12/17/2013 4.0  3.5 - 4.7 g/dL Final  . Globulin, Total 12/17/2013 1.8  1.5 - 4.5 g/dL Final  . Albumin/Globulin Ratio 12/17/2013 2.2  1.1 - 2.5 Final  . Total Bilirubin 12/17/2013 0.4  0.0 - 1.2 mg/dL Final  . Alkaline Phosphatase 12/17/2013 89  39 - 117 IU/L Final  . AST 12/17/2013 12  0 - 40 IU/L Final  . ALT 12/17/2013 12  0 - 32 IU/L Final  . Cholesterol, Total 12/17/2013 170  100 - 199 mg/dL Final  . Triglycerides  12/17/2013 244* 0 - 149 mg/dL Final  . HDL 12/17/2013 43  >39 mg/dL Final   Comment: According to ATP-III Guidelines, HDL-C >59 mg/dL is considered a                          negative risk factor for CHD.  Marland Kitchen VLDL Cholesterol Cal 12/17/2013 49* 5 - 40 mg/dL Final  . LDL Calculated 12/17/2013 78  0 - 99 mg/dL Final  . Chol/HDL Ratio 12/17/2013 4.0  0.0 - 4.4 ratio units Final   Comment:                                    T. Chol/HDL Ratio                                                                      Men  Women                                                        1/2 Avg.Risk  3.4    3.3                                                            Avg.Risk  5.0    4.4                                                         2X Avg.Risk  9.6    7.1                                                         3X Avg.Risk 23.4   11.0  . Creatinine, Ur 12/17/2013 53.6  15.0 - 278.0 mg/dL Final  . Microalbum.,U,Random 12/17/2013 30.4* 0.0 - 17.0 ug/mL Final  . MICROALB/CREAT RATIO 12/17/2013 56.7* 0.0 - 30.0 mg/g creat Final  . Specific Gravity, UA 12/17/2013 1.010  1.005 - 1.030 Final  . pH, UA 12/17/2013 7.0  5.0 - 7.5 Final  . Color, UA 12/17/2013 Yellow  Yellow Final  . Appearance Ur 12/17/2013 Cloudy* Clear Final  . Leukocytes, UA 12/17/2013 Negative  Negative Final  . Protein, UA 12/17/2013 Negative  Negative/Trace Final  . Glucose, UA 12/17/2013 Negative  Negative Final  . Ketones, UA 12/17/2013 Negative  Negative Final  . RBC, UA 12/17/2013 Trace* Negative Final  . Bilirubin, UA 12/17/2013 Negative  Negative Final  . Urobilinogen, Ur 12/17/2013 0.2  0.0 -  1.9 mg/dL Final  . Nitrite, UA 12/17/2013 Negative  Negative Final    12/21/13 EKG: rate 59. NSR  Assessment/Plan  1. Need for prophylactic vaccination and inoculation against influenza given  2. Unspecified essential hypertension controlled - EKG 12-Lead  3. Pain in joint, ankle and foot, left See podiatrist  4. Type II or unspecified type diabetes mellitus with neurological manifestations, not stated as uncontrolled(250.60) controlled  5. Other and unspecified hyperlipidemia controlled  6. Polyneuropathy in diabetes(357.2) unchanged  7. Varicose vein of leg observe  8. Chronic interstitial cystitis See urologist when she desires  9. Memory loss Continue routine MMSE  10. Seborrheic  keratosis OBSERVE  11. Hyperlipidemia CONTROLLED

## 2013-12-21 NOTE — Progress Notes (Signed)
Passed clock drawing 

## 2014-01-14 ENCOUNTER — Telehealth: Payer: Self-pay | Admitting: *Deleted

## 2014-01-14 NOTE — Telephone Encounter (Signed)
Patient called and stated that something bite her on the knee. Patient is not sure what it was. Now it is itching and red, no swelling or SOB. Advised patient to use Cortisone cream for the itching and watch it for any more redness or swelling or SOB. Advised her to go to the Urgent Care if any of these symptoms occur. Patient agreed.

## 2014-02-14 ENCOUNTER — Ambulatory Visit (INDEPENDENT_AMBULATORY_CARE_PROVIDER_SITE_OTHER): Payer: Medicare Other | Admitting: Podiatry

## 2014-02-14 ENCOUNTER — Encounter: Payer: Self-pay | Admitting: Podiatry

## 2014-02-14 VITALS — BP 154/86 | HR 76 | Resp 12

## 2014-02-14 DIAGNOSIS — M79676 Pain in unspecified toe(s): Secondary | ICD-10-CM | POA: Diagnosis not present

## 2014-02-14 DIAGNOSIS — B351 Tinea unguium: Secondary | ICD-10-CM | POA: Diagnosis not present

## 2014-02-14 NOTE — Patient Instructions (Signed)
Wear light grade knee length compression hose, open or close toe After adjustment to wearing compression hose for 3 days, then begin to wear the ankle stabilizer on the left ankle

## 2014-02-15 NOTE — Progress Notes (Signed)
Patient ID: Penny Miller, female   DOB: 05/03/1931, 80 y.o.   MRN: [id number]  Subjective: This patient presents today complaining of painful toenails and request debridement. Also, she states that the ankle stabilizer dispensed on the visit of 11/08/2013 initially was quite comfortable, however, she describes swelling in the left ankle and was not able to tolerate the stabilizer. She said that when she wore the ankle stabilizer rankle feel considerably better. She has a history of an unstable lateral left ankle. Her primary care physician suggested a neoprene type brace to wear which she has not done  Objective: The lateral left ankle is tender to palpation without any palpable lesions. Full range of motion left ankle without any crepitus Multiple varicosities lower legs and dorsal feet bilaterally The toenails are elongated, hypertrophic, discolored 6-10  Assessment: Unstable left left ankle Varicosities Symptomatic onychomycoses 6-10  Plan: Patient advised to purchase a below the knee late grade compression hose to wear on the left leg for several days and then restart the ankle stabilizer to see if this will allow her to have more comfort  Nails 10 are debrided without a bleeding  Reappoint 3 months

## 2014-02-28 DIAGNOSIS — N3010 Interstitial cystitis (chronic) without hematuria: Secondary | ICD-10-CM | POA: Diagnosis not present

## 2014-03-07 ENCOUNTER — Other Ambulatory Visit: Payer: Self-pay | Admitting: *Deleted

## 2014-03-07 MED ORDER — CITALOPRAM HYDROBROMIDE 10 MG PO TABS: ORAL_TABLET | ORAL | Status: DC

## 2014-03-07 NOTE — Telephone Encounter (Signed)
Patient requested refill until she is able to get her mailorder

## 2014-03-09 ENCOUNTER — Other Ambulatory Visit: Payer: Self-pay | Admitting: Internal Medicine

## 2014-03-18 DIAGNOSIS — H3531 Nonexudative age-related macular degeneration: Secondary | ICD-10-CM | POA: Diagnosis not present

## 2014-05-09 ENCOUNTER — Ambulatory Visit (INDEPENDENT_AMBULATORY_CARE_PROVIDER_SITE_OTHER): Payer: Medicare Other | Admitting: Podiatry

## 2014-05-09 ENCOUNTER — Encounter: Payer: Self-pay | Admitting: Podiatry

## 2014-05-09 DIAGNOSIS — B351 Tinea unguium: Secondary | ICD-10-CM | POA: Diagnosis not present

## 2014-05-09 DIAGNOSIS — M79676 Pain in unspecified toe(s): Secondary | ICD-10-CM | POA: Diagnosis not present

## 2014-05-09 NOTE — Patient Instructions (Signed)
Diabetes and Foot Care Diabetes may cause you to have problems because of poor blood supply (circulation) to your feet and legs. This may cause the skin on your feet to become thinner, break easier, and heal more slowly. Your skin may become dry, and the skin may peel and crack. You may also have nerve damage in your legs and feet causing decreased feeling in them. You may not notice minor injuries to your feet that could lead to infections or more serious problems. Taking care of your feet is one of the most important things you can do for yourself.  HOME CARE INSTRUCTIONS  Wear shoes at all times, even in the house. Do not go barefoot. Bare feet are easily injured.  Check your feet daily for blisters, cuts, and redness. If you cannot see the bottom of your feet, use a mirror or ask someone for help.  Wash your feet with warm water (do not use hot water) and mild soap. Then pat your feet and the areas between your toes until they are completely dry. Do not soak your feet as this can dry your skin.  Apply a moisturizing lotion or petroleum jelly (that does not contain alcohol and is unscented) to the skin on your feet and to dry, brittle toenails. Do not apply lotion between your toes.  Trim your toenails straight across. Do not dig under them or around the cuticle. File the edges of your nails with an emery board or nail file.  Do not cut corns or calluses or try to remove them with medicine.  Wear clean socks or stockings every day. Make sure they are not too tight. Do not wear knee-high stockings since they may decrease blood flow to your legs.  Wear shoes that fit properly and have enough cushioning. To break in new shoes, wear them for just a few hours a day. This prevents you from injuring your feet. Always look in your shoes before you put them on to be sure there are no objects inside.  Do not cross your legs. This may decrease the blood flow to your feet.  If you find a minor scrape,  cut, or break in the skin on your feet, keep it and the skin around it clean and dry. These areas may be cleansed with mild soap and water. Do not cleanse the area with peroxide, alcohol, or iodine.  When you remove an adhesive bandage, be sure not to damage the skin around it.  If you have a wound, look at it several times a day to make sure it is healing.  Do not use heating pads or hot water bottles. They may burn your skin. If you have lost feeling in your feet or legs, you may not know it is happening until it is too late.  Make sure your health care provider performs a complete foot exam at least annually or more often if you have foot problems. Report any cuts, sores, or bruises to your health care provider immediately. SEEK MEDICAL CARE IF:   You have an injury that is not healing.  You have cuts or breaks in the skin.  You have an ingrown nail.  You notice redness on your legs or feet.  You feel burning or tingling in your legs or feet.  You have pain or cramps in your legs and feet.  Your legs or feet are numb.  Your feet always feel cold. SEEK IMMEDIATE MEDICAL CARE IF:   There is increasing redness,   swelling, or pain in or around a wound.  There is a red line that goes up your leg.  Pus is coming from a wound.  You develop a fever or as directed by your health care provider.  You notice a bad smell coming from an ulcer or wound. Document Released: 03/15/2000 Document Revised: 11/18/2012 Document Reviewed: 08/25/2012 Exit[employer] Patient Information 2015 [employer]. This information is not intended to replace advice given to you by your health care provider. Make sure you discuss any questions you have with your health care provider.  

## 2014-05-10 NOTE — Progress Notes (Signed)
Patient ID: Penny Miller, female   DOB: [birth year], 81 y.o.   MRN: [id number]  Subjective: This patient presents today complaining of painful toenails and requests debridement  Objective: The toenails are elongated, hypertrophic, discolored and tender to palpation 6-10  Assessment: Symptomatic onychomycoses 6-10  Plan: Debridement toenails 10 without a bleeding   Reappoint 3 months

## 2014-05-23 ENCOUNTER — Other Ambulatory Visit: Payer: Self-pay | Admitting: *Deleted

## 2014-05-23 MED ORDER — METFORMIN HCL 500 MG PO TABS: ORAL_TABLET | ORAL | Status: DC

## 2014-05-23 NOTE — Telephone Encounter (Signed)
Patient Requested and faxed to pharmacy. 

## 2014-05-28 ENCOUNTER — Other Ambulatory Visit: Payer: Self-pay | Admitting: Nurse Practitioner

## 2014-05-30 ENCOUNTER — Other Ambulatory Visit: Payer: Self-pay

## 2014-05-30 DIAGNOSIS — E559 Vitamin D deficiency, unspecified: Secondary | ICD-10-CM

## 2014-06-01 ENCOUNTER — Other Ambulatory Visit: Payer: Self-pay

## 2014-06-01 MED ORDER — ATORVASTATIN CALCIUM 20 MG PO TABS: ORAL_TABLET | ORAL | Status: DC

## 2014-06-01 MED ORDER — AMLODIPINE BESYLATE 2.5 MG PO TABS: ORAL_TABLET | ORAL | Status: DC

## 2014-06-17 ENCOUNTER — Other Ambulatory Visit: Payer: Medicare Other

## 2014-06-17 DIAGNOSIS — E559 Vitamin D deficiency, unspecified: Secondary | ICD-10-CM | POA: Diagnosis not present

## 2014-06-17 DIAGNOSIS — I10 Essential (primary) hypertension: Secondary | ICD-10-CM | POA: Diagnosis not present

## 2014-06-17 DIAGNOSIS — E785 Hyperlipidemia, unspecified: Secondary | ICD-10-CM | POA: Diagnosis not present

## 2014-06-17 DIAGNOSIS — E1149 Type 2 diabetes mellitus with other diabetic neurological complication: Secondary | ICD-10-CM | POA: Diagnosis not present

## 2014-06-17 DIAGNOSIS — E1140 Type 2 diabetes mellitus with diabetic neuropathy, unspecified: Secondary | ICD-10-CM | POA: Diagnosis not present

## 2014-06-18 LAB — COMPREHENSIVE METABOLIC PANEL
ALBUMIN: 4 g/dL (ref 3.5–4.7)
ALT: 13 IU/L (ref 0–32)
AST: 17 IU/L (ref 0–40)
Albumin/Globulin Ratio: 2.1 (ref 1.1–2.5)
Alkaline Phosphatase: 93 IU/L (ref 39–117)
BILIRUBIN TOTAL: 0.3 mg/dL (ref 0.0–1.2)
BUN / CREAT RATIO: 17 (ref 11–26)
BUN: 19 mg/dL (ref 8–27)
CALCIUM: 9.6 mg/dL (ref 8.7–10.3)
CO2: 22 mmol/L (ref 18–29)
CREATININE: 1.12 mg/dL — AB (ref 0.57–1.00)
Chloride: 98 mmol/L (ref 97–108)
GFR calc Af Amer: 54 mL/min/{1.73_m2} — ABNORMAL LOW (ref >59)
GFR, EST NON AFRICAN AMERICAN: 47 mL/min/{1.73_m2} — AB (ref >59)
GLUCOSE: 140 mg/dL — AB (ref 65–99)
Globulin, Total: 1.9 g/dL (ref 1.5–4.5)
Potassium: 4.1 mmol/L (ref 3.5–5.2)
Sodium: 137 mmol/L (ref 134–144)
Total Protein: 5.9 g/dL — ABNORMAL LOW (ref 6.0–8.5)

## 2014-06-18 LAB — LIPID PANEL
Chol/HDL Ratio: 3.7 ratio units (ref 0.0–4.4)
Cholesterol, Total: 163 mg/dL (ref 100–199)
HDL: 44 mg/dL (ref >39)
LDL CALC: 78 mg/dL (ref 0–99)
TRIGLYCERIDES: 206 mg/dL — AB (ref 0–149)
VLDL Cholesterol Cal: 41 mg/dL — ABNORMAL HIGH (ref 5–40)

## 2014-06-18 LAB — VITAMIN D 25 HYDROXY (VIT D DEFICIENCY, FRACTURES): Vit D, 25-Hydroxy: 54.6 ng/mL (ref 30.0–100.0)

## 2014-06-18 LAB — MICROALBUMIN, URINE: Microalbumin, Urine: 59.1 ug/mL — ABNORMAL HIGH (ref 0.0–17.0)

## 2014-06-18 LAB — HEMOGLOBIN A1C
Est. average glucose Bld gHb Est-mCnc: 146 mg/dL
HEMOGLOBIN A1C: 6.7 % — AB (ref 4.8–5.6)

## 2014-06-22 ENCOUNTER — Encounter: Payer: Self-pay | Admitting: Internal Medicine

## 2014-06-22 ENCOUNTER — Ambulatory Visit (INDEPENDENT_AMBULATORY_CARE_PROVIDER_SITE_OTHER): Payer: Medicare Other | Admitting: Internal Medicine

## 2014-06-22 VITALS — BP 122/60 | HR 63 | Temp 97.3°F | Ht 68.0 in | Wt 172.4 lb

## 2014-06-22 DIAGNOSIS — E785 Hyperlipidemia, unspecified: Secondary | ICD-10-CM | POA: Diagnosis not present

## 2014-06-22 DIAGNOSIS — R531 Weakness: Secondary | ICD-10-CM | POA: Diagnosis not present

## 2014-06-22 DIAGNOSIS — N189 Chronic kidney disease, unspecified: Secondary | ICD-10-CM | POA: Diagnosis not present

## 2014-06-22 DIAGNOSIS — J329 Chronic sinusitis, unspecified: Secondary | ICD-10-CM | POA: Insufficient documentation

## 2014-06-22 DIAGNOSIS — E1129 Type 2 diabetes mellitus with other diabetic kidney complication: Secondary | ICD-10-CM | POA: Insufficient documentation

## 2014-06-22 DIAGNOSIS — N3010 Interstitial cystitis (chronic) without hematuria: Secondary | ICD-10-CM | POA: Diagnosis not present

## 2014-06-22 DIAGNOSIS — E1122 Type 2 diabetes mellitus with diabetic chronic kidney disease: Secondary | ICD-10-CM | POA: Diagnosis not present

## 2014-06-22 MED ORDER — CLINDAMYCIN HCL 300 MG PO CAPS: ORAL_CAPSULE | ORAL | Status: DC

## 2014-06-22 NOTE — Progress Notes (Signed)
Patient ID: Penny Miller, female   DOB: 17-Aug-1931, 81 y.o.   MRN: [id number]    Facility  [facility]    Place of Service:   OFFICE   Allergies  Allergen Reactions  . Ceclor [Cefaclor]   . Codeine   . Lopid [Gemfibrozil]   . Morphine And Related   . Niacin And Related   . Relafen [Nabumetone]   . Septra [Sulfamethoxazole-Trimethoprim]   . Toprol Xl [Metoprolol Tartrate]     Chief Complaint  Patient presents with  . Medical Management of Chronic Issues    6 Month Follow up    HPI:  Weak: Patient had an acute respiratory illness, likely viral over the last 2 weeks. She had a very poor appetite and felt weak during the illness. She does not run high fevers but some days she had a low-grade fever. She was coughing and producing some phlegm, but this seems improved.  Type 2 diabetes mellitus with diabetic chronic kidney disease: Stable and under control  Chronic sinusitis, unspecified location: Continues with head congestion particularly in the maxillary sinus areas.  Hyperlipidemia: Controlled  Chronic interstitial cystitis: Controlled    Medications: Patient's Medications  New Prescriptions   No medications on file  Previous Medications   AMLODIPINE (NORVASC) 2.5 MG TABLET    Take one tablet twice daily for blood pressure   ASPIRIN 81 MG TABLET    Take 81 mg by mouth daily.   ATENOLOL (TENORMIN) 50 MG TABLET    TAKE 1 TABLET DAILY TO HELPBLOOD PRESSURE   ATORVASTATIN (LIPITOR) 20 MG TABLET    Take one tablet once daily at bedtime  for cholesterol.   CALCIUM CARBONATE (TUMS EX) 750 MG CHEWABLE TABLET    Chew 1 tablet by mouth daily.   CITALOPRAM (CELEXA) 10 MG TABLET    Take 1/2 tablet by mouth once daily for depression   DEXTROMETHORPHAN-GUAIFENESIN (MUCINEX DM MAXIMUM STRENGTH PO)    Take by mouth.   METFORMIN (GLUCOPHAGE) 500 MG TABLET    Take one tablet by mouth once daily to control blood sugar.   NAPROXEN SODIUM (ANAPROX) 220 MG TABLET    Take 220 mg by mouth at  bedtime and may repeat dose one time if needed.   VITAMIN D, ERGOCALCIFEROL, (DRISDOL) 50000 UNITS CAPS CAPSULE    TAKE 1 CAPSULE WEEKLY FOR  VITAMIN D  Modified Medications   No medications on file  Discontinued Medications   NITROFURANTOIN (MACRODANTIN) 100 MG CAPSULE    Take 1 capsule  daily to treat bladder infection.     Review of Systems  Constitutional: Positive for fatigue. Negative for fever.  HENT: Positive for hearing loss.   Eyes: Positive for visual disturbance (corrective lenses).  Respiratory: Negative for cough, choking, chest tightness and shortness of breath.   Cardiovascular: Negative for chest pain, palpitations and leg swelling.  Endocrine:       Diabetic  Genitourinary:       Dribblling. Nocturia x 3.  Musculoskeletal: Positive for arthralgias.  Skin: Negative.   Neurological: Negative for dizziness, tremors, seizures, syncope and weakness.  Hematological: Negative.   Psychiatric/Behavioral:       Insomnia. Difficulty falling asleep and early morning awakening.    Filed Vitals:   06/22/14 1455  BP: 122/60  Pulse: 63  Temp: 97.3 F (36.3 C)  TempSrc: Oral  Height: 5\' 8"  (1.727 m)  Weight: 172 lb 6.4 oz (78.2 kg)   Body mass index is 26.22 kg/(m^2).  Physical Exam  Constitutional:  She is oriented to person, place, and time. She appears well-nourished. No distress.  HENT:  Head: Normocephalic and atraumatic.  Right Ear: External ear normal.  Left Ear: External ear normal.  Nose: Nose normal.  Mouth/Throat: Oropharynx is clear and moist. No oropharyngeal exudate.  Eyes: Conjunctivae and EOM are normal. Pupils are equal, round, and reactive to light.  Lens implant in both eyes.  Neck: No JVD present. No tracheal deviation present. No thyromegaly present.  Cardiovascular: Normal rate, regular rhythm, normal heart sounds and intact distal pulses.  Exam reveals no gallop and no friction rub.   No murmur heard. Pulmonary/Chest: No respiratory distress.  She has no wheezes. She has no rales. She exhibits no tenderness.  Fibrosis and thickening of both breasts  Abdominal: She exhibits no distension and no mass. There is no tenderness.  Musculoskeletal: She exhibits no tenderness.  Lymphadenopathy:    She has no cervical adenopathy.  Neurological: She is alert and oriented to person, place, and time. She has normal reflexes.  Reduced vibratory sensation of both feet 12/21/13 MMSE 29/30. Passed clock drawing.  Skin: No rash noted. No erythema. No pallor.  Dry  Psychiatric: She has a normal mood and affect. Her behavior is normal. Judgment and thought content normal.     Labs reviewed: Appointment on 06/17/2014  Component Date Value Ref Range Status  . Hgb A1c MFr Bld 06/17/2014 6.7* 4.8 - 5.6 % Final   Comment:          Pre-diabetes: 5.7 - 6.4          Diabetes: >6.4          Glycemic control for adults with diabetes: <7.0   . Est. average glucose Bld gHb Est-m* 06/17/2014 146   Final  . Glucose 06/17/2014 140* 65 - 99 mg/dL Final  . BUN 06/17/2014 19  8 - 27 mg/dL Final  . Creatinine, Ser 06/17/2014 1.12* 0.57 - 1.00 mg/dL Final  . GFR calc non Af Amer 06/17/2014 47* >59 mL/min/1.73 Final  . GFR calc Af Amer 06/17/2014 54* >59 mL/min/1.73 Final  . BUN/Creatinine Ratio 06/17/2014 17  11 - 26 Final  . Sodium 06/17/2014 137  134 - 144 mmol/L Final  . Potassium 06/17/2014 4.1  3.5 - 5.2 mmol/L Final  . Chloride 06/17/2014 98  97 - 108 mmol/L Final  . CO2 06/17/2014 22  18 - 29 mmol/L Final  . Calcium 06/17/2014 9.6  8.7 - 10.3 mg/dL Final  . Total Protein 06/17/2014 5.9* 6.0 - 8.5 g/dL Final  . Albumin 06/17/2014 4.0  3.5 - 4.7 g/dL Final  . Globulin, Total 06/17/2014 1.9  1.5 - 4.5 g/dL Final  . Albumin/Globulin Ratio 06/17/2014 2.1  1.1 - 2.5 Final  . Bilirubin Total 06/17/2014 0.3  0.0 - 1.2 mg/dL Final  . Alkaline Phosphatase 06/17/2014 93  39 - 117 IU/L Final  . AST 06/17/2014 17  0 - 40 IU/L Final  . ALT 06/17/2014 13  0 - 32  IU/L Final  . Cholesterol, Total 06/17/2014 163  100 - 199 mg/dL Final  . Triglycerides 06/17/2014 206* 0 - 149 mg/dL Final  . HDL 06/17/2014 44  >39 mg/dL Final   Comment: According to ATP-III Guidelines, HDL-C >59 mg/dL is considered a negative risk factor for CHD.   Marland Kitchen VLDL Cholesterol Cal 06/17/2014 41* 5 - 40 mg/dL Final  . LDL Calculated 06/17/2014 78  0 - 99 mg/dL Final  . Chol/HDL Ratio 06/17/2014 3.7  0.0 - 4.4  ratio units Final   Comment:                                   T. Chol/HDL Ratio                                             Men  Women                               1/2 Avg.Risk  3.4    3.3                                   Avg.Risk  5.0    4.4                                2X Avg.Risk  9.6    7.1                                3X Avg.Risk 23.4   11.0   . Microalbum.,U,Random 06/17/2014 59.1* 0.0 - 17.0 ug/mL Final  . Vit D, 25-Hydroxy 06/17/2014 54.6  30.0 - 100.0 ng/mL Final   Comment: Vitamin D deficiency has been defined by the [place] practice guideline as a level of serum 25-OH vitamin D less than 20 ng/mL (1,2). The Endocrine Society went on to further define vitamin D insufficiency as a level between 21 and 29 ng/mL (2). 1. IOM (Institute of Medicine). 2010. Dietary reference    intakes for calcium and D. [place]: The    [employer]. 2. Holick MF, Binkley Fair[place], Bischoff-Ferrari HA, et al.    Evaluation, treatment, and prevention of vitamin D    deficiency: an Endocrine Society clinical practice    guideline. JCEM. 2011 Jul; 96(7):1911-30.      Assessment/Plan  1. Weak Most likely this was a result of recent respiratory illness. She feels that she is improving somewhat.  2. Type 2 diabetes mellitus with diabetic chronic kidney disease - Hemoglobin A1c; Future - Basic metabolic panel; Future  3. Chronic sinusitis, unspecified location - clindamycin (CLEOCIN) 300 MG capsule; One 3 times daily  for infection  Dispense: 21 capsule; Refill: 0  4. Hyperlipidemia - Lipid panel  5. Chronic interstitial cystitis Improved

## 2014-06-28 ENCOUNTER — Other Ambulatory Visit: Payer: Self-pay | Admitting: *Deleted

## 2014-08-10 ENCOUNTER — Ambulatory Visit (INDEPENDENT_AMBULATORY_CARE_PROVIDER_SITE_OTHER): Payer: Medicare Other | Admitting: Podiatry

## 2014-08-10 ENCOUNTER — Encounter: Payer: Self-pay | Admitting: Podiatry

## 2014-08-10 DIAGNOSIS — B351 Tinea unguium: Secondary | ICD-10-CM

## 2014-08-10 DIAGNOSIS — M79676 Pain in unspecified toe(s): Secondary | ICD-10-CM | POA: Diagnosis not present

## 2014-08-10 NOTE — Patient Instructions (Signed)
Diabetes and Foot Care Diabetes may cause you to have problems because of poor blood supply (circulation) to your feet and legs. This may cause the skin on your feet to become thinner, break easier, and heal more slowly. Your skin may become dry, and the skin may peel and crack. You may also have nerve damage in your legs and feet causing decreased feeling in them. You may not notice minor injuries to your feet that could lead to infections or more serious problems. Taking care of your feet is one of the most important things you can do for yourself.  HOME CARE INSTRUCTIONS  Wear shoes at all times, even in the house. Do not go barefoot. Bare feet are easily injured.  Check your feet daily for blisters, cuts, and redness. If you cannot see the bottom of your feet, use a mirror or ask someone for help.  Wash your feet with warm water (do not use hot water) and mild soap. Then pat your feet and the areas between your toes until they are completely dry. Do not soak your feet as this can dry your skin.  Apply a moisturizing lotion or petroleum jelly (that does not contain alcohol and is unscented) to the skin on your feet and to dry, brittle toenails. Do not apply lotion between your toes.  Trim your toenails straight across. Do not dig under them or around the cuticle. File the edges of your nails with an emery board or nail file.  Do not cut corns or calluses or try to remove them with medicine.  Wear clean socks or stockings every day. Make sure they are not too tight. Do not wear knee-high stockings since they may decrease blood flow to your legs.  Wear shoes that fit properly and have enough cushioning. To break in new shoes, wear them for just a few hours a day. This prevents you from injuring your feet. Always look in your shoes before you put them on to be sure there are no objects inside.  Do not cross your legs. This may decrease the blood flow to your feet.  If you find a minor scrape,  cut, or break in the skin on your feet, keep it and the skin around it clean and dry. These areas may be cleansed with mild soap and water. Do not cleanse the area with peroxide, alcohol, or iodine.  When you remove an adhesive bandage, be sure not to damage the skin around it.  If you have a wound, look at it several times a day to make sure it is healing.  Do not use heating pads or hot water bottles. They may burn your skin. If you have lost feeling in your feet or legs, you may not know it is happening until it is too late.  Make sure your health care provider performs a complete foot exam at least annually or more often if you have foot problems. Report any cuts, sores, or bruises to your health care provider immediately. SEEK MEDICAL CARE IF:   You have an injury that is not healing.  You have cuts or breaks in the skin.  You have an ingrown nail.  You notice redness on your legs or feet.  You feel burning or tingling in your legs or feet.  You have pain or cramps in your legs and feet.  Your legs or feet are numb.  Your feet always feel cold. SEEK IMMEDIATE MEDICAL CARE IF:   There is increasing redness,   swelling, or pain in or around a wound.  There is a red line that goes up your leg.  Pus is coming from a wound.  You develop a fever or as directed by your health care provider.  You notice a bad smell coming from an ulcer or wound. Document Released: 03/15/2000 Document Revised: 11/18/2012 Document Reviewed: 08/25/2012 Exit[employer] Patient Information 2015 [employer]. This information is not intended to replace advice given to you by your health care provider. Make sure you discuss any questions you have with your health care provider.  

## 2014-08-12 NOTE — Progress Notes (Signed)
Patient ID: Penny Miller, female   DOB: 1931-12-03, 81 y.o.   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

## 2014-08-22 ENCOUNTER — Other Ambulatory Visit: Payer: Self-pay | Admitting: Internal Medicine

## 2014-09-12 ENCOUNTER — Other Ambulatory Visit: Payer: Self-pay | Admitting: Internal Medicine

## 2014-09-14 DIAGNOSIS — L821 Other seborrheic keratosis: Secondary | ICD-10-CM | POA: Diagnosis not present

## 2014-09-14 DIAGNOSIS — L820 Inflamed seborrheic keratosis: Secondary | ICD-10-CM | POA: Diagnosis not present

## 2014-09-23 ENCOUNTER — Other Ambulatory Visit: Payer: Self-pay | Admitting: *Deleted

## 2014-09-28 ENCOUNTER — Other Ambulatory Visit: Payer: Self-pay | Admitting: Internal Medicine

## 2014-09-30 DIAGNOSIS — E119 Type 2 diabetes mellitus without complications: Secondary | ICD-10-CM | POA: Diagnosis not present

## 2014-10-04 DIAGNOSIS — C44629 Squamous cell carcinoma of skin of left upper limb, including shoulder: Secondary | ICD-10-CM | POA: Diagnosis not present

## 2014-10-04 DIAGNOSIS — C44699 Other specified malignant neoplasm of skin of left upper limb, including shoulder: Secondary | ICD-10-CM | POA: Diagnosis not present

## 2014-10-04 DIAGNOSIS — L570 Actinic keratosis: Secondary | ICD-10-CM | POA: Diagnosis not present

## 2014-10-17 ENCOUNTER — Other Ambulatory Visit: Payer: Medicare Other

## 2014-10-17 DIAGNOSIS — N189 Chronic kidney disease, unspecified: Secondary | ICD-10-CM | POA: Diagnosis not present

## 2014-10-17 DIAGNOSIS — E1122 Type 2 diabetes mellitus with diabetic chronic kidney disease: Secondary | ICD-10-CM

## 2014-10-18 LAB — BASIC METABOLIC PANEL
BUN/Creatinine Ratio: 17 (ref 11–26)
BUN: 20 mg/dL (ref 8–27)
CALCIUM: 9.5 mg/dL (ref 8.7–10.3)
CHLORIDE: 100 mmol/L (ref 97–108)
CO2: 22 mmol/L (ref 18–29)
Creatinine, Ser: 1.16 mg/dL — ABNORMAL HIGH (ref 0.57–1.00)
GFR calc non Af Amer: 44 mL/min/{1.73_m2} — ABNORMAL LOW (ref >59)
GFR, EST AFRICAN AMERICAN: 51 mL/min/{1.73_m2} — AB (ref >59)
Glucose: 131 mg/dL — ABNORMAL HIGH (ref 65–99)
Potassium: 4.4 mmol/L (ref 3.5–5.2)
Sodium: 137 mmol/L (ref 134–144)

## 2014-10-18 LAB — HEMOGLOBIN A1C
Est. average glucose Bld gHb Est-mCnc: 151 mg/dL
HEMOGLOBIN A1C: 6.9 % — AB (ref 4.8–5.6)

## 2014-10-19 ENCOUNTER — Ambulatory Visit (INDEPENDENT_AMBULATORY_CARE_PROVIDER_SITE_OTHER): Payer: Medicare Other | Admitting: Internal Medicine

## 2014-10-19 ENCOUNTER — Encounter: Payer: Self-pay | Admitting: Internal Medicine

## 2014-10-19 VITALS — BP 130/90 | HR 57 | Temp 97.7°F | Resp 20 | Ht 68.0 in | Wt 173.2 lb

## 2014-10-19 DIAGNOSIS — E785 Hyperlipidemia, unspecified: Secondary | ICD-10-CM | POA: Diagnosis not present

## 2014-10-19 DIAGNOSIS — J329 Chronic sinusitis, unspecified: Secondary | ICD-10-CM | POA: Diagnosis not present

## 2014-10-19 DIAGNOSIS — E1122 Type 2 diabetes mellitus with diabetic chronic kidney disease: Secondary | ICD-10-CM | POA: Diagnosis not present

## 2014-10-19 DIAGNOSIS — N189 Chronic kidney disease, unspecified: Secondary | ICD-10-CM

## 2014-10-19 DIAGNOSIS — I10 Essential (primary) hypertension: Secondary | ICD-10-CM | POA: Diagnosis not present

## 2014-10-19 MED ORDER — DOXYCYCLINE HYCLATE 100 MG PO TABS: 100.0000 mg | ORAL_TABLET | Freq: Two times a day (BID) | ORAL | Status: DC

## 2014-10-19 NOTE — Progress Notes (Signed)
Patient ID: Penny Miller, female   DOB: Apr 29, 1931, 81 y.o.   MRN: [id number]    Facility  [facility]    Place of Service:   OFFICE    Allergies  Allergen Reactions  . Ceclor [Cefaclor]   . Codeine   . Lopid [Gemfibrozil]   . Morphine And Related   . Niacin And Related   . Relafen [Nabumetone]   . Septra [Sulfamethoxazole-Trimethoprim]   . Toprol Xl [Metoprolol Tartrate]     Chief Complaint  Patient presents with  . Medical Management of Chronic Issues    4 month follow-up    HPI:  Chronic sinusitis, unspecified location: Persistent respiratory symptoms for 4 months. Cough. Sinus congestion. Had similar problem in 2014. Still hoarse. Responded to doxycycline in the past. Still coughing up thick yellow mucus. Some wheezing. No fever. Used Mucinex..  Type 2 diabetes mellitus with diabetic chronic kidney disease: stable  Essential hypertension; controlled  Hyperlipidemia: follow at future visit.  Saw Dr. Allyson Sabal for removal of lesion on the left forearm. Was a cancer, but she is not sure what kind. Residual weeping spot where it was removed.  Medications: Patient's Medications  New Prescriptions   No medications on file  Previous Medications   AMLODIPINE (NORVASC) 2.5 MG TABLET    Take one tablet twice daily for blood pressure   ASPIRIN 81 MG TABLET    Take 81 mg by mouth daily.   ATENOLOL (TENORMIN) 50 MG TABLET    TAKE 1 TABLET DAILY TO HELPBLOOD PRESSURE   ATORVASTATIN (LIPITOR) 20 MG TABLET    Take one tablet once daily at bedtime  for cholesterol.   CALCIUM CARBONATE (TUMS EX) 750 MG CHEWABLE TABLET    Chew 1 tablet by mouth daily.   CITALOPRAM (CELEXA) 10 MG TABLET    Take 1/2 tablet by mouth once daily for depression   DEXTROMETHORPHAN-GUAIFENESIN (MUCINEX DM MAXIMUM STRENGTH PO)    Take by mouth.   METFORMIN (GLUCOPHAGE) 500 MG TABLET    TAKE 1 TABLET EVERY MORNINGFOR BLOOD SUGAR   NAPROXEN SODIUM (ANAPROX) 220 MG TABLET    Take 220 mg by mouth at bedtime and may  repeat dose one time if needed.   NITROFURANTOIN (MACRODANTIN) 100 MG CAPSULE    TAKE 1 CAPSULE DAILY TO    TREAT BLADDER INFECTION   VITAMIN D, ERGOCALCIFEROL, (DRISDOL) 50000 UNITS CAPS CAPSULE    TAKE 1 CAPSULE WEEKLY FOR  VITAMIN D  Modified Medications   No medications on file  Discontinued Medications   CITALOPRAM (CELEXA) 10 MG TABLET    TAKE 1/2 TABLET DAILY FOR  DEPRESSION   CITALOPRAM (CELEXA) 10 MG TABLET    TAKE 1/2 TABLET DAILY FOR  DEPRESSION   CLINDAMYCIN (CLEOCIN) 300 MG CAPSULE    One 3 times daily for infection   VITAMIN D, ERGOCALCIFEROL, (DRISDOL) 50000 UNITS CAPS CAPSULE    TAKE 1 CAPSULE WEEKLY FOR  VITAMIN D     Review of Systems  Constitutional: Positive for fatigue. Negative for fever.  HENT: Positive for hearing loss, rhinorrhea, sinus pressure and voice change (hoarse). Negative for trouble swallowing.   Eyes: Positive for visual disturbance (corrective lenses).  Respiratory: Negative for cough, choking, chest tightness and shortness of breath.        Hoarse  Cardiovascular: Negative for chest pain, palpitations and leg swelling.  Gastrointestinal: Negative.   Endocrine:       Diabetic  Genitourinary:       Dribblling. Nocturia x 3.  Musculoskeletal: Positive for arthralgias.  Skin: Negative.   Neurological: Negative for dizziness, tremors, seizures, syncope and weakness.  Hematological: Negative.   Psychiatric/Behavioral:       Insomnia. Difficulty falling asleep and early morning awakening.    Filed Vitals:   10/19/14 1124  BP: 130/90  Pulse: 57  Temp: 97.7 F (36.5 C)  TempSrc: Oral  Resp: 20  Height: 5\' 8"  (1.727 m)  Weight: 173 lb 3.2 oz (78.563 kg)  SpO2: 95%   Body mass index is 26.34 kg/(m^2).  Physical Exam  Constitutional: She is oriented to person, place, and time. She appears well-nourished. No distress.  HENT:  Head: Normocephalic and atraumatic.  Right Ear: External ear normal.  Left Ear: External ear normal.  Nose: Nose  normal.  Mouth/Throat: Oropharynx is clear and moist. No oropharyngeal exudate.  Eyes: Conjunctivae and EOM are normal. Pupils are equal, round, and reactive to light.  Lens implant in both eyes.  Neck: No JVD present. No tracheal deviation present. No thyromegaly present.  Cardiovascular: Normal rate, regular rhythm, normal heart sounds and intact distal pulses.  Exam reveals no gallop and no friction rub.   No murmur heard. Pulmonary/Chest: No respiratory distress. She has no wheezes. She has no rales. She exhibits no tenderness.  Fibrosis and thickening of both breasts  Abdominal: She exhibits no distension and no mass. There is no tenderness.  Musculoskeletal: She exhibits no tenderness.  Lymphadenopathy:    She has no cervical adenopathy.  Neurological: She is alert and oriented to person, place, and time. She has normal reflexes.  Reduced vibratory sensation of both feet 12/21/13 MMSE 29/30. Passed clock drawing.  Skin: No rash noted. No erythema. No pallor.  Dry  Psychiatric: She has a normal mood and affect. Her behavior is normal. Judgment and thought content normal.     Labs reviewed: Appointment on 10/17/2014  Component Date Value Ref Range Status  . Hgb A1c MFr Bld 10/17/2014 6.9* 4.8 - 5.6 % Final   Comment:          Pre-diabetes: 5.7 - 6.4          Diabetes: >6.4          Glycemic control for adults with diabetes: <7.0   . Est. average glucose Bld gHb Est-m* 10/17/2014 151   Final  . Glucose 10/17/2014 131* 65 - 99 mg/dL Final  . BUN 10/17/2014 20  8 - 27 mg/dL Final  . Creatinine, Ser 10/17/2014 1.16* 0.57 - 1.00 mg/dL Final  . GFR calc non Af Amer 10/17/2014 44* >59 mL/min/1.73 Final  . GFR calc Af Amer 10/17/2014 51* >59 mL/min/1.73 Final  . BUN/Creatinine Ratio 10/17/2014 17  11 - 26 Final  . Sodium 10/17/2014 137  134 - 144 mmol/L Final  . Potassium 10/17/2014 4.4  3.5 - 5.2 mmol/L Final  . Chloride 10/17/2014 100  97 - 108 mmol/L Final  . CO2 10/17/2014 22   18 - 29 mmol/L Final  . Calcium 10/17/2014 9.5  8.7 - 10.3 mg/dL Final     Assessment/Plan

## 2014-11-06 ENCOUNTER — Other Ambulatory Visit: Payer: Self-pay | Admitting: Internal Medicine

## 2014-11-21 DIAGNOSIS — Z85828 Personal history of other malignant neoplasm of skin: Secondary | ICD-10-CM | POA: Diagnosis not present

## 2014-11-21 DIAGNOSIS — L821 Other seborrheic keratosis: Secondary | ICD-10-CM | POA: Diagnosis not present

## 2014-11-21 DIAGNOSIS — L570 Actinic keratosis: Secondary | ICD-10-CM | POA: Diagnosis not present

## 2014-11-21 DIAGNOSIS — L820 Inflamed seborrheic keratosis: Secondary | ICD-10-CM | POA: Diagnosis not present

## 2014-11-21 DIAGNOSIS — Z08 Encounter for follow-up examination after completed treatment for malignant neoplasm: Secondary | ICD-10-CM | POA: Diagnosis not present

## 2014-11-23 ENCOUNTER — Ambulatory Visit (INDEPENDENT_AMBULATORY_CARE_PROVIDER_SITE_OTHER): Payer: Medicare Other | Admitting: Podiatry

## 2014-11-23 ENCOUNTER — Encounter: Payer: Self-pay | Admitting: Podiatry

## 2014-11-23 DIAGNOSIS — M79676 Pain in unspecified toe(s): Secondary | ICD-10-CM | POA: Diagnosis not present

## 2014-11-23 DIAGNOSIS — B351 Tinea unguium: Secondary | ICD-10-CM | POA: Diagnosis not present

## 2014-11-23 NOTE — Patient Instructions (Signed)
Diabetes and Foot Care Diabetes may cause you to have problems because of poor blood supply (circulation) to your feet and legs. This may cause the skin on your feet to become thinner, break easier, and heal more slowly. Your skin may become dry, and the skin may peel and crack. You may also have nerve damage in your legs and feet causing decreased feeling in them. You may not notice minor injuries to your feet that could lead to infections or more serious problems. Taking care of your feet is one of the most important things you can do for yourself.  HOME CARE INSTRUCTIONS  Wear shoes at all times, even in the house. Do not go barefoot. Bare feet are easily injured.  Check your feet daily for blisters, cuts, and redness. If you cannot see the bottom of your feet, use a mirror or ask someone for help.  Wash your feet with warm water (do not use hot water) and mild soap. Then pat your feet and the areas between your toes until they are completely dry. Do not soak your feet as this can dry your skin.  Apply a moisturizing lotion or petroleum jelly (that does not contain alcohol and is unscented) to the skin on your feet and to dry, brittle toenails. Do not apply lotion between your toes.  Trim your toenails straight across. Do not dig under them or around the cuticle. File the edges of your nails with an emery board or nail file.  Do not cut corns or calluses or try to remove them with medicine.  Wear clean socks or stockings every day. Make sure they are not too tight. Do not wear knee-high stockings since they may decrease blood flow to your legs.  Wear shoes that fit properly and have enough cushioning. To break in new shoes, wear them for just a few hours a day. This prevents you from injuring your feet. Always look in your shoes before you put them on to be sure there are no objects inside.  Do not cross your legs. This may decrease the blood flow to your feet.  If you find a minor scrape,  cut, or break in the skin on your feet, keep it and the skin around it clean and dry. These areas may be cleansed with mild soap and water. Do not cleanse the area with peroxide, alcohol, or iodine.  When you remove an adhesive bandage, be sure not to damage the skin around it.  If you have a wound, look at it several times a day to make sure it is healing.  Do not use heating pads or hot water bottles. They may burn your skin. If you have lost feeling in your feet or legs, you may not know it is happening until it is too late.  Make sure your health care provider performs a complete foot exam at least annually or more often if you have foot problems. Report any cuts, sores, or bruises to your health care provider immediately. SEEK MEDICAL CARE IF:   You have an injury that is not healing.  You have cuts or breaks in the skin.  You have an ingrown nail.  You notice redness on your legs or feet.  You feel burning or tingling in your legs or feet.  You have pain or cramps in your legs and feet.  Your legs or feet are numb.  Your feet always feel cold. SEEK IMMEDIATE MEDICAL CARE IF:   There is increasing redness,   swelling, or pain in or around a wound.  There is a red line that goes up your leg.  Pus is coming from a wound.  You develop a fever or as directed by your health care provider.  You notice a bad smell coming from an ulcer or wound. Document Released: 03/15/2000 Document Revised: 11/18/2012 Document Reviewed: 08/25/2012 Exit[employer] Patient Information 2015 [employer]. This information is not intended to replace advice given to you by your health care provider. Make sure you discuss any questions you have with your health care provider.  

## 2014-11-24 NOTE — Progress Notes (Signed)
Patient ID: Penny Miller, female   DOB: 09-Jul-1931, 81 y.o.   MRN: [id number]   Subjective: This patient presents for a scheduled visit requesting toenail debridement  Objective: The toenails are elongated, hypertrophic, incurvated, discolored and tender to direct palpation 6-10  Assessment: Onychomycoses 6-10 Type II diabetic with peripheral neuropathy  Plan: Debridement toenails 10 and mechanically and electrically without a bleeding  Reappoint times three-month

## 2014-12-07 DIAGNOSIS — R399 Unspecified symptoms and signs involving the genitourinary system: Secondary | ICD-10-CM | POA: Diagnosis not present

## 2015-01-10 DIAGNOSIS — Z08 Encounter for follow-up examination after completed treatment for malignant neoplasm: Secondary | ICD-10-CM | POA: Diagnosis not present

## 2015-01-10 DIAGNOSIS — Z85828 Personal history of other malignant neoplasm of skin: Secondary | ICD-10-CM | POA: Diagnosis not present

## 2015-01-10 DIAGNOSIS — L570 Actinic keratosis: Secondary | ICD-10-CM | POA: Diagnosis not present

## 2015-02-18 ENCOUNTER — Other Ambulatory Visit: Payer: Self-pay | Admitting: Internal Medicine

## 2015-02-22 ENCOUNTER — Other Ambulatory Visit: Payer: Medicare Other

## 2015-02-22 DIAGNOSIS — E1122 Type 2 diabetes mellitus with diabetic chronic kidney disease: Secondary | ICD-10-CM

## 2015-02-22 DIAGNOSIS — E785 Hyperlipidemia, unspecified: Secondary | ICD-10-CM | POA: Diagnosis not present

## 2015-02-22 DIAGNOSIS — I10 Essential (primary) hypertension: Secondary | ICD-10-CM

## 2015-02-23 LAB — BASIC METABOLIC PANEL
BUN/Creatinine Ratio: 16 (ref 11–26)
BUN: 17 mg/dL (ref 8–27)
CO2: 24 mmol/L (ref 18–29)
Calcium: 9.5 mg/dL (ref 8.7–10.3)
Chloride: 100 mmol/L (ref 97–106)
Creatinine, Ser: 1.04 mg/dL — ABNORMAL HIGH (ref 0.57–1.00)
GFR, EST AFRICAN AMERICAN: 58 mL/min/{1.73_m2} — AB (ref >59)
GFR, EST NON AFRICAN AMERICAN: 51 mL/min/{1.73_m2} — AB (ref >59)
Glucose: 132 mg/dL — ABNORMAL HIGH (ref 65–99)
POTASSIUM: 4.4 mmol/L (ref 3.5–5.2)
Sodium: 139 mmol/L (ref 136–144)

## 2015-02-23 LAB — LIPID PANEL
CHOL/HDL RATIO: 4.2 ratio (ref 0.0–4.4)
CHOLESTEROL TOTAL: 172 mg/dL (ref 100–199)
HDL: 41 mg/dL (ref >39)
LDL Calculated: 84 mg/dL (ref 0–99)
TRIGLYCERIDES: 234 mg/dL — AB (ref 0–149)
VLDL Cholesterol Cal: 47 mg/dL — ABNORMAL HIGH (ref 5–40)

## 2015-02-23 LAB — HEMOGLOBIN A1C
ESTIMATED AVERAGE GLUCOSE: 148 mg/dL
Hgb A1c MFr Bld: 6.8 % — ABNORMAL HIGH (ref 4.8–5.6)

## 2015-02-24 ENCOUNTER — Other Ambulatory Visit: Payer: Self-pay | Admitting: Internal Medicine

## 2015-02-27 DIAGNOSIS — Z09 Encounter for follow-up examination after completed treatment for conditions other than malignant neoplasm: Secondary | ICD-10-CM | POA: Diagnosis not present

## 2015-02-27 DIAGNOSIS — N3020 Other chronic cystitis without hematuria: Secondary | ICD-10-CM | POA: Diagnosis not present

## 2015-02-27 DIAGNOSIS — N3010 Interstitial cystitis (chronic) without hematuria: Secondary | ICD-10-CM | POA: Diagnosis not present

## 2015-02-28 ENCOUNTER — Encounter: Payer: Self-pay | Admitting: Internal Medicine

## 2015-02-28 ENCOUNTER — Ambulatory Visit (INDEPENDENT_AMBULATORY_CARE_PROVIDER_SITE_OTHER): Payer: Medicare Other | Admitting: Internal Medicine

## 2015-02-28 ENCOUNTER — Ambulatory Visit: Payer: Medicare Other | Admitting: Podiatry

## 2015-02-28 VITALS — BP 138/80 | HR 56 | Temp 97.9°F | Resp 20 | Ht 68.0 in | Wt 173.2 lb

## 2015-02-28 DIAGNOSIS — Z23 Encounter for immunization: Secondary | ICD-10-CM

## 2015-02-28 DIAGNOSIS — E785 Hyperlipidemia, unspecified: Secondary | ICD-10-CM | POA: Diagnosis not present

## 2015-02-28 DIAGNOSIS — E1122 Type 2 diabetes mellitus with diabetic chronic kidney disease: Secondary | ICD-10-CM

## 2015-02-28 DIAGNOSIS — N3010 Interstitial cystitis (chronic) without hematuria: Secondary | ICD-10-CM

## 2015-02-28 DIAGNOSIS — N181 Chronic kidney disease, stage 1: Secondary | ICD-10-CM

## 2015-02-28 DIAGNOSIS — I10 Essential (primary) hypertension: Secondary | ICD-10-CM | POA: Diagnosis not present

## 2015-02-28 DIAGNOSIS — Z9181 History of falling: Secondary | ICD-10-CM

## 2015-02-28 MED ORDER — ATENOLOL 50 MG PO TABS: ORAL_TABLET | ORAL | Status: DC

## 2015-02-28 NOTE — Progress Notes (Signed)
Patient ID: Penny Miller, female   DOB: May 07, 1931, 81 y.o.   MRN: [id number]    Facility  [facility]    Place of Service:   OFFICE    Allergies  Allergen Reactions  . Ceclor [Cefaclor]   . Codeine   . Lopid [Gemfibrozil]   . Morphine And Related   . Niacin And Related   . Relafen [Nabumetone]   . Septra [Sulfamethoxazole-Trimethoprim]   . Tetracycline Nausea And Vomiting  . Toprol Xl [Metoprolol Tartrate]     Chief Complaint  Patient presents with  . Medical Management of Chronic Issues    4 month follow-up for Hypertension, Hyperlipidemia, DM    HPI:  [name] at bedside in late Sept 2016. Bruised up arm. Gets woozy when she stands up.last episode was 1-2 months ago. Has had feeling that she might black out.  Type 2 diabetes mellitus with stage 1 chronic kidney disease, without long-term current use of insulin (HCC) -   Essential hypertension - controlled  Hyperlipidemia - controlled  Chronic interstitial cystitis - episodes of increased pains. Given a medication from Dr. Rosana Hoes, urologist that helped. She does not recall the name  Need for prophylactic vaccination and inoculation against influenza - Plan: Flu Vaccine QUAD 36+ mos PF IM (Fluarix & Fluzone Quad PF)     Medications: Patient's Medications  New Prescriptions   No medications on file  Previous Medications   AMLODIPINE (NORVASC) 2.5 MG TABLET    Take one tablet twice daily for blood pressure   ASPIRIN 81 MG TABLET    Take 81 mg by mouth daily.   ATENOLOL (TENORMIN) 50 MG TABLET    TAKE 1 TABLET DAILY TO HELPBLOOD PRESSURE   ATORVASTATIN (LIPITOR) 20 MG TABLET    Take one tablet once daily at bedtime  for cholesterol.   CALCIUM CARBONATE (TUMS EX) 750 MG CHEWABLE TABLET    Chew 1 tablet by mouth daily.   CITALOPRAM (CELEXA) 10 MG TABLET    Take 1/2 tablet by mouth once daily for depression   DEXTROMETHORPHAN-GUAIFENESIN (MUCINEX DM MAXIMUM STRENGTH PO)    Take by mouth.   METFORMIN (GLUCOPHAGE) 500 MG TABLET     TAKE 1 TABLET EVERY MORNINGFOR BLOOD SUGAR   NAPROXEN SODIUM (ANAPROX) 220 MG TABLET    Take 220 mg by mouth at bedtime and may repeat dose one time if needed.   NITROFURANTOIN (MACRODANTIN) 100 MG CAPSULE    TAKE 1 CAPSULE DAILY TO    TREAT BLADDER INFECTION   VITAMIN D, ERGOCALCIFEROL, (DRISDOL) 50000 UNITS CAPS CAPSULE    TAKE 1 CAPSULE WEEKLY FOR  VITAMIN D  Modified Medications   No medications on file  Discontinued Medications   DOXYCYCLINE (VIBRA-TABS) 100 MG TABLET    Take 1 tablet (100 mg total) by mouth 2 (two) times daily.   VITAMIN D, ERGOCALCIFEROL, (DRISDOL) 50000 UNITS CAPS CAPSULE    TAKE 1 CAPSULE WEEKLY FOR  VITAMIN D   VITAMIN D, ERGOCALCIFEROL, (DRISDOL) 50000 UNITS CAPS CAPSULE    TAKE 1 CAPSULE WEEKLY FOR  VITAMIN D    Review of Systems  Constitutional: Positive for fatigue. Negative for fever.  HENT: Positive for hearing loss, rhinorrhea, sinus pressure and voice change (hoarse). Negative for trouble swallowing.   Eyes: Positive for visual disturbance (corrective lenses).  Respiratory: Negative for cough, choking, chest tightness and shortness of breath.        Hoarse  Cardiovascular: Negative for chest pain, palpitations and leg swelling.  Gastrointestinal: Negative.  Endocrine:       Diabetic  Genitourinary:       Dribblling. Nocturia x 3.  Musculoskeletal: Positive for arthralgias.  Skin: Negative.   Neurological: Negative for dizziness, tremors, seizures, syncope and weakness.  Hematological: Negative.   Psychiatric/Behavioral:       Insomnia. Difficulty falling asleep and early morning awakening.    Filed Vitals:   02/28/15 1427  BP: 138/80  Pulse: 56  Temp: 97.9 F (36.6 C)  TempSrc: Oral  Resp: 20  Height: '5\' 8"'  (1.727 m)  Weight: 173 lb 3.2 oz (78.563 kg)  SpO2: 93%   Body mass index is 26.34 kg/(m^2).  Physical Exam  Constitutional: She is oriented to person, place, and time. She appears well-nourished. No distress.  HENT:  Head:  Normocephalic and atraumatic.  Right Ear: External ear normal.  Left Ear: External ear normal.  Nose: Nose normal.  Mouth/Throat: Oropharynx is clear and moist. No oropharyngeal exudate.  Eyes: Conjunctivae and EOM are normal. Pupils are equal, round, and reactive to light.  Lens implant in both eyes.  Neck: No JVD present. No tracheal deviation present. No thyromegaly present.  Cardiovascular: Normal rate, regular rhythm, normal heart sounds and intact distal pulses.  Exam reveals no gallop and no friction rub.   No murmur heard. Pulmonary/Chest: No respiratory distress. She has no wheezes. She has no rales. She exhibits no tenderness.  Fibrosis and thickening of both breasts  Abdominal: She exhibits no distension and no mass. There is no tenderness.  Musculoskeletal: She exhibits no tenderness.  Lymphadenopathy:    She has no cervical adenopathy.  Neurological: She is alert and oriented to person, place, and time. She has normal reflexes.  Reduced vibratory sensation of both feet 12/21/13 MMSE 29/30. Passed clock drawing.  Skin: No rash noted. No erythema. No pallor.  Dry  Psychiatric: She has a normal mood and affect. Her behavior is normal. Judgment and thought content normal.    Labs reviewed: Lab Summary Latest Ref Rng 02/22/2015 10/17/2014 06/17/2014 12/17/2013  Hemoglobin - (None) (None) (None) (None)  Hematocrit - (None) (None) (None) (None)  White count - (None) (None) (None) (None)  Platelet count - (None) (None) (None) (None)  Sodium 136 - 144 mmol/L 139 137 137 140  Potassium 3.5 - 5.2 mmol/L 4.4 4.4 4.1 4.2  Calcium 8.7 - 10.3 mg/dL 9.5 9.5 9.6 9.7  Phosphorus - (None) (None) (None) (None)  Creatinine 0.57 - 1.00 mg/dL 1.04(H) 1.16(H) 1.12(H) 1.03(H)  AST 0 - 40 IU/L (None) (None) 17 12  Alk Phos 39 - 117 IU/L (None) (None) 93 89  Bilirubin 0.0 - 1.2 mg/dL (None) (None) 0.3 0.4  Glucose 65 - 99 mg/dL 132(H) 131(H) 140(H) 140(H)  Cholesterol - (None) (None) (None)  (None)  HDL cholesterol >39 mg/dL 41 (None) 44 43  Triglycerides 0 - 149 mg/dL 234(H) (None) 206(H) 244(H)  LDL Direct - (None) (None) (None) (None)  LDL Calc 0 - 99 mg/dL 84 (None) 78 78  Total protein - (None) (None) (None) (None)  Albumin 3.5 - 4.7 g/dL (None) (None) 4.0 4.0   No results found for: TSH, T3TOTAL, T4TOTAL, THYROIDAB Lab Results  Component Value Date   BUN 17 02/22/2015   Lab Results  Component Value Date   HGBA1C 6.8* 02/22/2015    Assessment/Plan  1. Type 2 diabetes mellitus with stage 1 chronic kidney disease, without long-term current use of insulin (HCC) DIET CONTROL  2. Essential hypertension - atenolol (TENORMIN) 50 MG tablet; TAKE 1/2  TABLET DAILY TO HELP  CONTROL BLOOD PRESSURE  Dispense: 90 tablet; Refill: 1  3. Hyperlipidemia controlled  4. Chronic interstitial cystitis Seeing Dr. Rosana Hoes  5. Need for prophylactic vaccination and inoculation against influenza - Flu Vaccine QUAD 36+ mos PF IM (Fluarix & Fluzone Quad PF)  6. History of fall Reduced Tenormin

## 2015-03-14 ENCOUNTER — Encounter: Payer: Self-pay | Admitting: Podiatry

## 2015-03-14 ENCOUNTER — Ambulatory Visit (INDEPENDENT_AMBULATORY_CARE_PROVIDER_SITE_OTHER): Payer: Medicare Other | Admitting: Podiatry

## 2015-03-14 DIAGNOSIS — M79676 Pain in unspecified toe(s): Secondary | ICD-10-CM | POA: Diagnosis not present

## 2015-03-14 DIAGNOSIS — B351 Tinea unguium: Secondary | ICD-10-CM | POA: Diagnosis not present

## 2015-03-14 NOTE — Patient Instructions (Signed)
Diabetes and Foot Care Diabetes may cause you to have problems because of poor blood supply (circulation) to your feet and legs. This may cause the skin on your feet to become thinner, break easier, and heal more slowly. Your skin may become dry, and the skin may peel and crack. You may also have nerve damage in your legs and feet causing decreased feeling in them. You may not notice minor injuries to your feet that could lead to infections or more serious problems. Taking care of your feet is one of the most important things you can do for yourself.  HOME CARE INSTRUCTIONS  Wear shoes at all times, even in the house. Do not go barefoot. Bare feet are easily injured.  Check your feet daily for blisters, cuts, and redness. If you cannot see the bottom of your feet, use a mirror or ask someone for help.  Wash your feet with warm water (do not use hot water) and mild soap. Then pat your feet and the areas between your toes until they are completely dry. Do not soak your feet as this can dry your skin.  Apply a moisturizing lotion or petroleum jelly (that does not contain alcohol and is unscented) to the skin on your feet and to dry, brittle toenails. Do not apply lotion between your toes.  Trim your toenails straight across. Do not dig under them or around the cuticle. File the edges of your nails with an emery board or nail file.  Do not cut corns or calluses or try to remove them with medicine.  Wear clean socks or stockings every day. Make sure they are not too tight. Do not wear knee-high stockings since they may decrease blood flow to your legs.  Wear shoes that fit properly and have enough cushioning. To break in new shoes, wear them for just a few hours a day. This prevents you from injuring your feet. Always look in your shoes before you put them on to be sure there are no objects inside.  Do not cross your legs. This may decrease the blood flow to your feet.  If you find a minor scrape,  cut, or break in the skin on your feet, keep it and the skin around it clean and dry. These areas may be cleansed with mild soap and water. Do not cleanse the area with peroxide, alcohol, or iodine.  When you remove an adhesive bandage, be sure not to damage the skin around it.  If you have a wound, look at it several times a day to make sure it is healing.  Do not use heating pads or hot water bottles. They may burn your skin. If you have lost feeling in your feet or legs, you may not know it is happening until it is too late.  Make sure your health care provider performs a complete foot exam at least annually or more often if you have foot problems. Report any cuts, sores, or bruises to your health care provider immediately. SEEK MEDICAL CARE IF:   You have an injury that is not healing.  You have cuts or breaks in the skin.  You have an ingrown nail.  You notice redness on your legs or feet.  You feel burning or tingling in your legs or feet.  You have pain or cramps in your legs and feet.  Your legs or feet are numb.  Your feet always feel cold. SEEK IMMEDIATE MEDICAL CARE IF:   There is increasing redness,   swelling, or pain in or around a wound.  There is a red line that goes up your leg.  Pus is coming from a wound.  You develop a fever or as directed by your health care provider.  You notice a bad smell coming from an ulcer or wound.   This information is not intended to replace advice given to you by your health care provider. Make sure you discuss any questions you have with your health care provider.   Document Released: 03/15/2000 Document Revised: 11/18/2012 Document Reviewed: 08/25/2012 Elsevier Interactive Patient Education 2016 [employer]  

## 2015-03-14 NOTE — Progress Notes (Signed)
Patient ID: Penny Miller, female   DOB: [birth year], 81 y.o.   MRN: [id number]  Subjective: This patient presents again for scheduled visit complaining of uncomfortable toenails and walking wearing shoes and requests nail debridement  Objective: Open skin lesions bilaterally The toenails are elongated, incurvated, discolored, hypertrophic and tender to direct palpation 6-10  Assessment: Type II diabetic with peripheral neuropathy Onychomycoses 6-10 with symptoms  Plan: Debridement toenails 6-10 mechanically and electronically without any bleeding  Reappoint 3 months

## 2015-04-13 DIAGNOSIS — H353122 Nonexudative age-related macular degeneration, left eye, intermediate dry stage: Secondary | ICD-10-CM | POA: Diagnosis not present

## 2015-06-08 ENCOUNTER — Other Ambulatory Visit: Payer: Self-pay | Admitting: Internal Medicine

## 2015-06-09 ENCOUNTER — Other Ambulatory Visit: Payer: Self-pay | Admitting: Internal Medicine

## 2015-06-20 ENCOUNTER — Ambulatory Visit: Payer: Medicare Other | Admitting: Podiatry

## 2015-06-23 ENCOUNTER — Other Ambulatory Visit: Payer: Medicare Other

## 2015-06-23 DIAGNOSIS — I10 Essential (primary) hypertension: Secondary | ICD-10-CM | POA: Diagnosis not present

## 2015-06-23 DIAGNOSIS — E1122 Type 2 diabetes mellitus with diabetic chronic kidney disease: Secondary | ICD-10-CM | POA: Diagnosis not present

## 2015-06-23 DIAGNOSIS — E785 Hyperlipidemia, unspecified: Secondary | ICD-10-CM | POA: Diagnosis not present

## 2015-06-23 DIAGNOSIS — N181 Chronic kidney disease, stage 1: Secondary | ICD-10-CM | POA: Diagnosis not present

## 2015-06-24 LAB — LIPID PANEL
CHOL/HDL RATIO: 3.7 ratio (ref 0.0–4.4)
Cholesterol, Total: 159 mg/dL (ref 100–199)
HDL: 43 mg/dL (ref >39)
LDL CALC: 75 mg/dL (ref 0–99)
TRIGLYCERIDES: 204 mg/dL — AB (ref 0–149)
VLDL CHOLESTEROL CAL: 41 mg/dL — AB (ref 5–40)

## 2015-06-24 LAB — HEMOGLOBIN A1C
ESTIMATED AVERAGE GLUCOSE: 154 mg/dL
HEMOGLOBIN A1C: 7 % — AB (ref 4.8–5.6)

## 2015-06-24 LAB — COMPREHENSIVE METABOLIC PANEL
ALBUMIN: 3.9 g/dL (ref 3.5–4.7)
ALT: 19 IU/L (ref 0–32)
AST: 15 IU/L (ref 0–40)
Albumin/Globulin Ratio: 1.8 (ref 1.2–2.2)
Alkaline Phosphatase: 91 IU/L (ref 39–117)
BUN / CREAT RATIO: 14 (ref 11–26)
BUN: 16 mg/dL (ref 8–27)
Bilirubin Total: 0.3 mg/dL (ref 0.0–1.2)
CALCIUM: 9.3 mg/dL (ref 8.7–10.3)
CO2: 23 mmol/L (ref 18–29)
CREATININE: 1.13 mg/dL — AB (ref 0.57–1.00)
Chloride: 99 mmol/L (ref 96–106)
GFR calc Af Amer: 53 mL/min/{1.73_m2} — ABNORMAL LOW (ref >59)
GFR, EST NON AFRICAN AMERICAN: 46 mL/min/{1.73_m2} — AB (ref >59)
GLOBULIN, TOTAL: 2.2 g/dL (ref 1.5–4.5)
Glucose: 135 mg/dL — ABNORMAL HIGH (ref 65–99)
Potassium: 4.3 mmol/L (ref 3.5–5.2)
SODIUM: 139 mmol/L (ref 134–144)
Total Protein: 6.1 g/dL (ref 6.0–8.5)

## 2015-06-27 ENCOUNTER — Encounter: Payer: Self-pay | Admitting: Internal Medicine

## 2015-06-27 ENCOUNTER — Ambulatory Visit (INDEPENDENT_AMBULATORY_CARE_PROVIDER_SITE_OTHER): Payer: Medicare Other | Admitting: Internal Medicine

## 2015-06-27 VITALS — BP 120/66 | HR 55 | Temp 97.8°F | Ht 68.0 in | Wt 172.0 lb

## 2015-06-27 DIAGNOSIS — N181 Chronic kidney disease, stage 1: Secondary | ICD-10-CM

## 2015-06-27 DIAGNOSIS — E1122 Type 2 diabetes mellitus with diabetic chronic kidney disease: Secondary | ICD-10-CM | POA: Diagnosis not present

## 2015-06-27 DIAGNOSIS — R002 Palpitations: Secondary | ICD-10-CM

## 2015-06-27 DIAGNOSIS — I10 Essential (primary) hypertension: Secondary | ICD-10-CM | POA: Diagnosis not present

## 2015-06-27 DIAGNOSIS — E785 Hyperlipidemia, unspecified: Secondary | ICD-10-CM

## 2015-06-27 DIAGNOSIS — R0600 Dyspnea, unspecified: Secondary | ICD-10-CM | POA: Diagnosis not present

## 2015-06-27 DIAGNOSIS — M6530 Trigger finger, unspecified finger: Secondary | ICD-10-CM | POA: Diagnosis not present

## 2015-06-27 NOTE — Progress Notes (Signed)
Patient ID: Penny Miller, female   DOB: 1931-10-11, 82 y.o.   MRN: [id number]   Location:  [facility]  Provider: Jeanmarie Hubert, M.D.  Code Status: full Goals of Care:  Advanced Directives 06/27/2015  Does patient have an advance directive? No  Would patient like information on creating an advanced directive? No - patient declined information     Chief Complaint  Patient presents with  . Medical Management of Chronic Issues    blood sugar, blood pressure, cholesterol, review labs. Here with grandaughter [name]  . pulse    added 1/2 tablet Atenolol at evening, because of feeling her pulse all over her body, since adding the extra tablet it is better.    HPI: Patient is a 82 y.o. female seen today for medical management of chronic diseases.    Type 2 diabetes mellitus with stage 1 chronic kidney disease, without long-term current use of insulin (HCC) - Unchanged  Palpitations - patient reports a constellation of symptoms worrisome for cardiac problems in this hypertensive diabetic. She has episodes where she feels suddenly tired. They can be accompanied by dyspnea, palpitations, and a clammy sweaty feeling. There is no chest pain with this episode. There is some dyspnea.  Essential hypertension - controlled  Dyspnea - episodes of dyspnea as noted above. Patient also generally tries to walk outdoors, but is finding that she is having more episodes where she just "gives out". No chest pain with this.  Trigger finger - right third finger. Getting worse.     Past Medical History  Diagnosis Date  . Type II or unspecified type diabetes mellitus with neurological manifestations, not stated as uncontrolled   . Polyneuropathy in diabetes(357.2)   . Postmenopausal bleeding   . Enthesopathy of unspecified site   . Dysuria   . Sacroiliitis, not elsewhere classified ([place])   . Unspecified vitamin D deficiency   . Other malaise and fatigue   . Viral warts, unspecified   . Insomnia,  unspecified   . Memory loss   . Proteinuria   . Allergic rhinitis, cause unspecified   . Encounter for long-term (current) use of other medications   . Pain in joint, site unspecified   . Anxiety state, unspecified   . Other premature beats   . Unspecified essential hypertension   . Lipoma of unspecified site   . Other and unspecified hyperlipidemia   . Irritable bowel syndrome   . Female stress incontinence   . Diaphragmatic hernia without mention of obstruction or gangrene   . Diverticulosis of colon (without mention of hemorrhage)   . Chronic interstitial cystitis   . Interstitial cystitis     surgery 02/01/2013    Past Surgical History  Procedure Laterality Date  . Appendectomy  1965  . Abdominal hysterectomy  1977    Dr.Hambright  . Cystoscopy  1987    Dr.Bradley  . Trigger finger release  1990    Dr.Carter  . Cataract extraction, bilateral  02/10/2006    Dr.Groat   . Urinary bladder       surgery 02/01/2013 to stretch bladder stem  . Colonoscopy  07/17/2004    polypectomy    Allergies  Allergen Reactions  . Ceclor [Cefaclor]   . Codeine   . Lopid [Gemfibrozil]   . Morphine And Related   . Niacin And Related   . Relafen [Nabumetone]   . Septra [Sulfamethoxazole-Trimethoprim]   . Tetracycline Nausea And Vomiting  . Toprol Xl [Metoprolol Tartrate]  Medication List       This list is accurate as of: 06/27/15  3:20 PM.  Always use your most recent med list.               amLODipine 2.5 MG tablet  Commonly known as:  NORVASC  Take one tablet twice daily for blood pressure     aspirin 81 MG tablet  Take 81 mg by mouth daily.     atenolol 50 MG tablet  Commonly known as:  TENORMIN  TAKE 1/2 TABLET DAILY TO HELP  CONTROL BLOOD PRESSURE     atenolol 50 MG tablet  Commonly known as:  TENORMIN  TAKE 1 TABLET DAILY TO HELPBLOOD PRESSURE     atorvastatin 20 MG tablet  Commonly known as:  LIPITOR  Take one tablet once daily at bedtime  for  cholesterol.     calcium carbonate 750 MG chewable tablet  Commonly known as:  TUMS EX  Chew 1 tablet by mouth daily.     citalopram 10 MG tablet  Commonly known as:  CELEXA  Take 1/2 tablet by mouth once daily for depression     metFORMIN 500 MG tablet  Commonly known as:  GLUCOPHAGE  TAKE 1 TABLET EVERY MORNINGFOR BLOOD SUGAR     MUCINEX DM MAXIMUM STRENGTH PO  Take by mouth.     naproxen sodium 220 MG tablet  Commonly known as:  ANAPROX  Take 220 mg by mouth at bedtime and may repeat dose one time if needed.     nitrofurantoin 100 MG capsule  Commonly known as:  MACRODANTIN  TAKE 1 CAPSULE DAILY TO    TREAT BLADDER INFECTION     Vitamin D (Ergocalciferol) 50000 units Caps capsule  Commonly known as:  DRISDOL  TAKE 1 CAPSULE WEEKLY FOR  VITAMIN D        Review of Systems:  Review of Systems  Constitutional: Positive for fatigue. Negative for fever.  HENT: Positive for hearing loss, rhinorrhea, sinus pressure and voice change (hoarse). Negative for trouble swallowing.   Eyes: Positive for visual disturbance (corrective lenses).  Respiratory: Positive for shortness of breath. Negative for cough, choking and chest tightness.        Hoarse  Cardiovascular: Positive for palpitations. Negative for chest pain and leg swelling.  Gastrointestinal: Negative.   Endocrine:       Diabetic  Genitourinary:       Dribblling. Nocturia x 3.  Musculoskeletal: Positive for arthralgias.       New problem of right third finger trigger finger. Previously had trigger thumb on the right side that was repaired many many years ago.  Skin: Negative.   Neurological: Negative for dizziness, tremors, seizures, syncope and weakness.  Hematological: Negative.   Psychiatric/Behavioral:       Insomnia. Difficulty falling asleep and early morning awakening.    Health Maintenance  Topic Date Due  . ZOSTAVAX  09/23/1993  . PNA vac Low Risk Adult (1 of 2 - PCV13) 09/24/1998  . OPHTHALMOLOGY EXAM   07/01/2014  . URINE MICROALBUMIN  06/17/2015  . FOOT EXAM  06/22/2015  . INFLUENZA VACCINE  10/31/2015  . HEMOGLOBIN A1C  12/24/2015  . TETANUS/TDAP  09/15/2018  . DEXA SCAN  Completed    Physical Exam: Filed Vitals:   06/27/15 1433  BP: 120/66  Pulse: 55  Temp: 97.8 F (36.6 C)  TempSrc: Oral  Height: 5\' 8"  (1.727 m)  Weight: 172 lb (78.019 kg)  SpO2: 96%  Body mass index is 26.16 kg/(m^2). Physical Exam  Constitutional: She is oriented to person, place, and time. She appears well-nourished. No distress.  HENT:  Head: Normocephalic and atraumatic.  Right Ear: External ear normal.  Left Ear: External ear normal.  Nose: Nose normal.  Mouth/Throat: Oropharynx is clear and moist. No oropharyngeal exudate.  Eyes: Conjunctivae and EOM are normal. Pupils are equal, round, and reactive to light.  Lens implant in both eyes.  Neck: No JVD present. No tracheal deviation present. No thyromegaly present.  Cardiovascular: Normal rate, regular rhythm, normal heart sounds and intact distal pulses.  Exam reveals no gallop and no friction rub.   No murmur heard. Pulmonary/Chest: No respiratory distress. She has no wheezes. She has no rales. She exhibits no tenderness.  Fibrosis and thickening of both breasts  Abdominal: She exhibits no distension and no mass. There is no tenderness.  Musculoskeletal: She exhibits no tenderness.  Trigger finger right third finger  Lymphadenopathy:    She has no cervical adenopathy.  Neurological: She is alert and oriented to person, place, and time. She has normal reflexes.  Reduced vibratory sensation of both feet 12/21/13 MMSE 29/30. Passed clock drawing.  Skin: No rash noted. No erythema. No pallor.  Dry  Psychiatric: She has a normal mood and affect. Her behavior is normal. Judgment and thought content normal.    Labs reviewed: Basic Metabolic Panel:  Recent Labs  10/17/14 0829 02/22/15 0904 06/23/15 0927  NA 137 139 139  K 4.4 4.4 4.3    CL 100 100 99  CO2 22 24 23   GLUCOSE 131* 132* 135*  BUN 20 17 16   CREATININE 1.16* 1.04* 1.13*  CALCIUM 9.5 9.5 9.3   Liver Function Tests:  Recent Labs  06/23/15 0927  AST 15  ALT 19  ALKPHOS 91  BILITOT 0.3  PROT 6.1  ALBUMIN 3.9   No results for input(s): LIPASE, AMYLASE in the last 8760 hours. No results for input(s): AMMONIA in the last 8760 hours. CBC: No results for input(s): WBC, NEUTROABS, HGB, HCT, MCV, PLT in the last 8760 hours. Lipid Panel:  Recent Labs  02/22/15 0904 06/23/15 0927  CHOL 172 159  HDL 41 43  LDLCALC 84 75  TRIG 234* 204*  CHOLHDL 4.2 3.7   Lab Results  Component Value Date   HGBA1C 7.0* 06/23/2015    Assessment/Plan 1. Type 2 diabetes mellitus with stage 1 chronic kidney disease, without long-term current use of insulin (HCC) - Hemoglobin A1c; Future - Microalbumin, urine; Future - Comprehensive metabolic panel; Future  2. Palpitations - Ambulatory referral to Cardiology  3. Essential hypertension - Comprehensive metabolic panel; Future  4. Dyspnea - Ambulatory referral to Cardiology  5. Trigger finger - Ambulatory referral to Hand Surgery  6. HLD (hyperlipidemia) - Lipid panel; Future

## 2015-07-11 DIAGNOSIS — Z85828 Personal history of other malignant neoplasm of skin: Secondary | ICD-10-CM | POA: Diagnosis not present

## 2015-07-12 ENCOUNTER — Other Ambulatory Visit: Payer: Self-pay | Admitting: *Deleted

## 2015-07-12 MED ORDER — ATORVASTATIN CALCIUM 20 MG PO TABS: ORAL_TABLET | ORAL | Status: DC

## 2015-07-18 ENCOUNTER — Encounter: Payer: Self-pay | Admitting: Podiatry

## 2015-07-18 ENCOUNTER — Ambulatory Visit (INDEPENDENT_AMBULATORY_CARE_PROVIDER_SITE_OTHER): Payer: Medicare Other | Admitting: Podiatry

## 2015-07-18 DIAGNOSIS — B351 Tinea unguium: Secondary | ICD-10-CM | POA: Diagnosis not present

## 2015-07-18 DIAGNOSIS — M79676 Pain in unspecified toe(s): Secondary | ICD-10-CM | POA: Diagnosis not present

## 2015-07-18 NOTE — Patient Instructions (Signed)
Diabetes and Foot Care Diabetes may cause you to have problems because of poor blood supply (circulation) to your feet and legs. This may cause the skin on your feet to become thinner, break easier, and heal more slowly. Your skin may become dry, and the skin may peel and crack. You may also have nerve damage in your legs and feet causing decreased feeling in them. You may not notice minor injuries to your feet that could lead to infections or more serious problems. Taking care of your feet is one of the most important things you can do for yourself.  HOME CARE INSTRUCTIONS  Wear shoes at all times, even in the house. Do not go barefoot. Bare feet are easily injured.  Check your feet daily for blisters, cuts, and redness. If you cannot see the bottom of your feet, use a mirror or ask someone for help.  Wash your feet with warm water (do not use hot water) and mild soap. Then pat your feet and the areas between your toes until they are completely dry. Do not soak your feet as this can dry your skin.  Apply a moisturizing lotion or petroleum jelly (that does not contain alcohol and is unscented) to the skin on your feet and to dry, brittle toenails. Do not apply lotion between your toes.  Trim your toenails straight across. Do not dig under them or around the cuticle. File the edges of your nails with an emery board or nail file.  Do not cut corns or calluses or try to remove them with medicine.  Wear clean socks or stockings every day. Make sure they are not too tight. Do not wear knee-high stockings since they may decrease blood flow to your legs.  Wear shoes that fit properly and have enough cushioning. To break in new shoes, wear them for just a few hours a day. This prevents you from injuring your feet. Always look in your shoes before you put them on to be sure there are no objects inside.  Do not cross your legs. This may decrease the blood flow to your feet.  If you find a minor scrape,  cut, or break in the skin on your feet, keep it and the skin around it clean and dry. These areas may be cleansed with mild soap and water. Do not cleanse the area with peroxide, alcohol, or iodine.  When you remove an adhesive bandage, be sure not to damage the skin around it.  If you have a wound, look at it several times a day to make sure it is healing.  Do not use heating pads or hot water bottles. They may burn your skin. If you have lost feeling in your feet or legs, you may not know it is happening until it is too late.  Make sure your health care provider performs a complete foot exam at least annually or more often if you have foot problems. Report any cuts, sores, or bruises to your health care provider immediately. SEEK MEDICAL CARE IF:   You have an injury that is not healing.  You have cuts or breaks in the skin.  You have an ingrown nail.  You notice redness on your legs or feet.  You feel burning or tingling in your legs or feet.  You have pain or cramps in your legs and feet.  Your legs or feet are numb.  Your feet always feel cold. SEEK IMMEDIATE MEDICAL CARE IF:   There is increasing redness,   swelling, or pain in or around a wound.  There is a red line that goes up your leg.  Pus is coming from a wound.  You develop a fever or as directed by your health care provider.  You notice a bad smell coming from an ulcer or wound.   This information is not intended to replace advice given to you by your health care provider. Make sure you discuss any questions you have with your health care provider.   Document Released: 03/15/2000 Document Revised: 11/18/2012 Document Reviewed: 08/25/2012 Elsevier Interactive Patient Education 2016 [employer]  

## 2015-07-18 NOTE — Progress Notes (Signed)
Patient ID: Penny Miller, female   DOB: [birth year], 82 y.o.   MRN: [id number]   Subjective: This patient presents again for scheduled visit complaining of uncomfortable toenails and walking wearing shoes and requests nail debridement  Objective: Open skin lesions bilaterally The toenails are elongated, incurvated, discolored, hypertrophic and tender to direct palpation 6-10  Assessment: Type II diabetic with peripheral neuropathy Onychomycoses 6-10 with symptoms  Plan: Debridement toenails 6-10 mechanically and electronically without any bleeding  Reappoint 3 months

## 2015-08-03 NOTE — Progress Notes (Signed)
Cardiology Office Note   Date:  08/04/2015   ID:  GAI NEVEU, DOB Nov 26, 1931, MRN [id number]  PCP:  Estill Dooms, MD  Cardiologist:   Minus Breeding, MD   Chief Complaint  Patient presents with  . Dizziness  . Shortness of Breath      History of Present Illness: Penny Miller is a 82 y.o. female who presents for Evaluation of dizziness. This has been happening for about a year. She'll be on her feet typically doesn't have to be very long before she'll start to feel nauseated somewhat short of breath and have leg weakness. She'll have to sit down or lie down to recover. She does describe some episodes where she gets lightheaded with standing. This is when she changes position. She had one episode where she had near syncope and fell to the ground. She occasionally has some rare palpitations but this is typically not a problem. She is doing some of her household chores and denies with this any cardiovascular symptoms. She does not have chest pressure, neck or arm discomfort. She does not have shortness of breath, PND or orthopnea. She does not have cardiovascular testing.  Of note she said she used to have more palpitations when she was taken off of Inderal at one point. However, she says that she was started back on Inderal. I don't see this on her list. I suspect she is taking atenolol and thinks it's Inderal.   Past Medical History  Diagnosis Date  . Type II or unspecified type diabetes mellitus with neurological manifestations, not stated as uncontrolled   . Polyneuropathy in diabetes(357.2)   . Postmenopausal bleeding   . Viral warts, unspecified   . Insomnia, unspecified   . Memory loss   . Proteinuria   . Allergic rhinitis, cause unspecified   . Anxiety state, unspecified   . Other premature beats   . Unspecified essential hypertension   . Other and unspecified hyperlipidemia   . Irritable bowel syndrome   . Female stress incontinence   . Diaphragmatic hernia  without mention of obstruction or gangrene   . Diverticulosis of colon (without mention of hemorrhage)   . Interstitial cystitis     surgery 02/01/2013    Past Surgical History  Procedure Laterality Date  . Appendectomy  1965  . Abdominal hysterectomy  1977    Dr.Hambright  . Cystoscopy  1987    Dr.Bradley  . Trigger finger release  1990    Dr.Carter  . Cataract extraction, bilateral  02/10/2006    Dr.Groat   . Urinary bladder       surgery 02/01/2013 to stretch bladder stem  . Colonoscopy  07/17/2004    polypectomy     Current Outpatient Prescriptions  Medication Sig Dispense Refill  . amLODipine (NORVASC) 2.5 MG tablet Take one tablet twice daily for blood pressure 180 tablet 3  . aspirin 81 MG tablet Take 81 mg by mouth daily.    Marland Kitchen atenolol (TENORMIN) 50 MG tablet TAKE 1 TABLET DAILY TO HELPBLOOD PRESSURE 90 tablet 1  . atorvastatin (LIPITOR) 20 MG tablet Take one tablet once daily at bedtime  for cholesterol. 90 tablet 3  . calcium carbonate (TUMS EX) 750 MG chewable tablet Chew 1 tablet by mouth daily.    . citalopram (CELEXA) 10 MG tablet Take 1/2 tablet by mouth once daily for depression 15 tablet 0  . Dextromethorphan-Guaifenesin (MUCINEX DM MAXIMUM STRENGTH PO) Take by mouth.    . metFORMIN (GLUCOPHAGE)  500 MG tablet TAKE 1 TABLET EVERY MORNINGFOR BLOOD SUGAR 90 tablet 1  . naproxen sodium (ANAPROX) 220 MG tablet Take 220 mg by mouth at bedtime and may repeat dose one time if needed.    . nitrofurantoin (MACRODANTIN) 100 MG capsule TAKE 1 CAPSULE DAILY TO    TREAT BLADDER INFECTION 90 capsule 3  . Vitamin D, Ergocalciferol, (DRISDOL) 50000 UNITS CAPS capsule TAKE 1 CAPSULE WEEKLY FOR  VITAMIN D 12 capsule 1   No current facility-administered medications for this visit.    Allergies:   Ceclor; Codeine; Lopid; Metoprolol; Morphine and related; Niacin and related; Relafen; Septra; Tetracycline; and Toprol xl    Social History:  The patient  reports that she quit  smoking about 4 years ago. She has never used smokeless tobacco. She reports that she does not drink alcohol or use illicit drugs.   Family History:  The patient's family history includes Cancer in her father and mother; Diabetes in her brother, brother, and sister; Heart disease (age of onset: 29) in her brother; Hypertension in her brother and sister; Kidney cancer in her brother.    ROS:  Please see the history of present illness.   Otherwise, review of systems are positive for none.   All other systems are reviewed and negative.    PHYSICAL EXAM: VS:  BP 132/66 mmHg  Pulse 59  Ht 5\' 8"  (1.727 m)  Wt 170 lb (77.111 kg)  BMI 25.85 kg/m2 , BMI Body mass index is 25.85 kg/(m^2). GENERAL:  Well appearing HEENT:  Pupils equal round and reactive, fundi not visualized, oral mucosa unremarkable NECK:  No jugular venous distention, waveform within normal limits, carotid upstroke brisk and symmetric, soft right carotid bruit, no left bruit, no thyromegaly LYMPHATICS:  No cervical, inguinal adenopathy LUNGS:  Clear to auscultation bilaterally BACK:  No CVA tenderness CHEST:  Unremarkable HEART:  PMI not displaced or sustained,S1 and S2 within normal limits, no S3, no S4, no clicks, no rubs, no murmurs ABD:  Flat, positive bowel sounds normal in frequency in pitch, no bruits, no rebound, no guarding, no midline pulsatile mass, no hepatomegaly, no splenomegaly EXT:  2 plus pulses throughout, no edema, no cyanosis no clubbing SKIN:  No rashes no nodules NEURO:  Cranial nerves II through XII grossly intact, motor grossly intact throughout PSYCH:  Cognitively intact, oriented to person place and time    EKG:  EKG is ordered today. The ekg ordered today demonstrates NSR, rate 59, axis WNL, intervals WNL, PACs.  No acute St T wave changes.     Recent Labs: 06/23/2015: ALT 19; BUN 16; Creatinine, Ser 1.13*; Potassium 4.3; Sodium 139    Lipid Panel    Component Value Date/Time   CHOL 159  06/23/2015 0927   TRIG 204* 06/23/2015 0927   HDL 43 06/23/2015 0927   CHOLHDL 3.7 06/23/2015 0927   LDLCALC 75 06/23/2015 0927      Wt Readings from Last 3 Encounters:  08/04/15 170 lb (77.111 kg)  06/27/15 172 lb (78.019 kg)  02/28/15 173 lb 3.2 oz (78.563 kg)      Other studies Reviewed: Additional studies/ records that were reviewed today include: Office records. Review of the above records demonstrates:  Please see elsewhere in the note.     ASSESSMENT AND PLAN:  DIZZINESS:  She did have a drop in her standing BP.  We talked about conservative measures for treating orthostasis.  I have prescribed compression stockings.  Of note I would like to  do a screening POET (Plain Old Exercise Treadmill).  However, she would not be able to walk on a treadmill so I will avoid this.   BRUIT:  She will get a carotid Doppler.  She reports some mild carotid plaque years ago.    PALPITATIONS:  She will call us back to clarify whether she is taking Inderal and atenolol.  I suspect not but if so we will stop one of the beta blockers.  Current medicines are reviewed at length with the patient today.  The patient does not have concerns regarding medicines.  The following changes have been made:  no change  Labs/ tests ordered today include:   Orders Placed This Encounter  Procedures  . EKG 12-Lead     Disposition:   FU with me in 4 months.     Signed, Minus Breeding, MD  08/04/2015 9:34 AM    [facility]

## 2015-08-04 ENCOUNTER — Ambulatory Visit (INDEPENDENT_AMBULATORY_CARE_PROVIDER_SITE_OTHER): Payer: Medicare Other | Admitting: Cardiology

## 2015-08-04 ENCOUNTER — Encounter: Payer: Self-pay | Admitting: Cardiology

## 2015-08-04 VITALS — BP 132/66 | HR 59 | Ht 68.0 in | Wt 170.0 lb

## 2015-08-04 DIAGNOSIS — R55 Syncope and collapse: Secondary | ICD-10-CM

## 2015-08-04 NOTE — Patient Instructions (Addendum)
Medication Instructions:  Continue all medication theraphy  Labwork: NONE  Testing/Procedures: NONE  Follow-Up: Your physician wants you to follow-up in: 4 Months You will receive a reminder letter in the mail two months in advance. If you don't receive a letter, please call our office to schedule the follow-up appointment.   Any Other Special Instructions Will Be Listed Below (If Applicable).   If you need a refill on your cardiac medications before your next appointment, please call your pharmacy.

## 2015-08-11 ENCOUNTER — Other Ambulatory Visit: Payer: Self-pay | Admitting: *Deleted

## 2015-08-11 MED ORDER — AMLODIPINE BESYLATE 2.5 MG PO TABS: ORAL_TABLET | ORAL | Status: DC

## 2015-08-11 NOTE — Telephone Encounter (Signed)
Patient requested Rx to be sent to local pharmacy and Mail Order. She is almost out of her medication. Rx's sent to both pharmacies.

## 2015-08-23 DIAGNOSIS — M79641 Pain in right hand: Secondary | ICD-10-CM | POA: Diagnosis not present

## 2015-08-23 DIAGNOSIS — M65331 Trigger finger, right middle finger: Secondary | ICD-10-CM | POA: Diagnosis not present

## 2015-08-31 ENCOUNTER — Other Ambulatory Visit: Payer: Self-pay | Admitting: Internal Medicine

## 2015-09-11 ENCOUNTER — Other Ambulatory Visit: Payer: Self-pay | Admitting: *Deleted

## 2015-09-11 ENCOUNTER — Other Ambulatory Visit: Payer: Self-pay | Admitting: Nurse Practitioner

## 2015-09-11 MED ORDER — NITROFURANTOIN MACROCRYSTAL 100 MG PO CAPS: ORAL_CAPSULE | ORAL | Status: DC

## 2015-09-11 NOTE — Telephone Encounter (Signed)
Patient requested refill to be sent to [facility]

## 2015-09-20 DIAGNOSIS — M65331 Trigger finger, right middle finger: Secondary | ICD-10-CM | POA: Diagnosis not present

## 2015-10-17 ENCOUNTER — Ambulatory Visit: Payer: Medicare Other | Admitting: Podiatry

## 2015-10-24 NOTE — Addendum Note (Signed)
Addended by: Moshe Cipro Colton Engdahl A on: 10/24/2015 03:52 PM   Modules accepted: Orders

## 2015-10-30 ENCOUNTER — Other Ambulatory Visit: Payer: Medicare Other

## 2015-10-30 DIAGNOSIS — E785 Hyperlipidemia, unspecified: Secondary | ICD-10-CM

## 2015-10-30 DIAGNOSIS — E1122 Type 2 diabetes mellitus with diabetic chronic kidney disease: Secondary | ICD-10-CM | POA: Diagnosis not present

## 2015-10-30 DIAGNOSIS — I10 Essential (primary) hypertension: Secondary | ICD-10-CM | POA: Diagnosis not present

## 2015-10-30 DIAGNOSIS — N181 Chronic kidney disease, stage 1: Principal | ICD-10-CM

## 2015-10-30 LAB — LIPID PANEL
CHOLESTEROL: 158 mg/dL (ref 125–200)
HDL: 43 mg/dL — ABNORMAL LOW (ref >=46)
LDL Cholesterol: 70 mg/dL (ref <130)
TRIGLYCERIDES: 226 mg/dL — AB (ref <150)
Total CHOL/HDL Ratio: 3.7 Ratio (ref <=5.0)
VLDL: 45 mg/dL — ABNORMAL HIGH (ref <30)

## 2015-10-30 LAB — HEMOGLOBIN A1C
HEMOGLOBIN A1C: 6.8 % — AB (ref <5.7)
MEAN PLASMA GLUCOSE: 148 mg/dL

## 2015-10-30 LAB — COMPREHENSIVE METABOLIC PANEL
ALBUMIN: 4 g/dL (ref 3.6–5.1)
ALT: 14 U/L (ref 6–29)
AST: 15 U/L (ref 10–35)
Alkaline Phosphatase: 75 U/L (ref 33–130)
BILIRUBIN TOTAL: 0.5 mg/dL (ref 0.2–1.2)
BUN: 22 mg/dL (ref 7–25)
CALCIUM: 9.4 mg/dL (ref 8.6–10.4)
CHLORIDE: 103 mmol/L (ref 98–110)
CO2: 26 mmol/L (ref 20–31)
Creat: 1.19 mg/dL — ABNORMAL HIGH (ref 0.60–0.88)
GLUCOSE: 130 mg/dL — AB (ref 65–99)
Potassium: 4.7 mmol/L (ref 3.5–5.3)
SODIUM: 137 mmol/L (ref 135–146)
Total Protein: 6.2 g/dL (ref 6.1–8.1)

## 2015-10-31 LAB — MICROALBUMIN, URINE: Microalb, Ur: 4.9 mg/dL

## 2015-11-01 ENCOUNTER — Ambulatory Visit (INDEPENDENT_AMBULATORY_CARE_PROVIDER_SITE_OTHER): Payer: Medicare Other | Admitting: Internal Medicine

## 2015-11-01 ENCOUNTER — Encounter: Payer: Self-pay | Admitting: Internal Medicine

## 2015-11-01 VITALS — BP 120/68 | HR 54 | Temp 97.6°F | Ht 68.0 in | Wt 170.0 lb

## 2015-11-01 DIAGNOSIS — R002 Palpitations: Secondary | ICD-10-CM

## 2015-11-01 DIAGNOSIS — I10 Essential (primary) hypertension: Secondary | ICD-10-CM | POA: Diagnosis not present

## 2015-11-01 DIAGNOSIS — E1122 Type 2 diabetes mellitus with diabetic chronic kidney disease: Secondary | ICD-10-CM | POA: Diagnosis not present

## 2015-11-01 DIAGNOSIS — E785 Hyperlipidemia, unspecified: Secondary | ICD-10-CM

## 2015-11-01 DIAGNOSIS — N181 Chronic kidney disease, stage 1: Secondary | ICD-10-CM | POA: Diagnosis not present

## 2015-11-01 DIAGNOSIS — N3010 Interstitial cystitis (chronic) without hematuria: Secondary | ICD-10-CM

## 2015-11-01 NOTE — Progress Notes (Signed)
  Facility  [facility]    Place of Service:   OFFICE    Allergies  Allergen Reactions  . Ceclor [Cefaclor] Other (See Comments)  . Codeine   . Lopid [Gemfibrozil]   . Metoprolol Nausea And Vomiting  . Morphine And Related   . Niacin And Related   . Relafen [Nabumetone] Nausea And Vomiting  . Septra [Sulfamethoxazole-Trimethoprim] Nausea And Vomiting  . Tetracycline Nausea And Vomiting  . Toprol Xl [Metoprolol Tartrate]     Chief Complaint  Patient presents with  . Medical Management of Chronic Issues    4 month medication management blood sugar, CKD, blood pressure, cholesterol.    HPI:  Still dizzy from time to time. Saw Dr. Hochrein. Advised her to lay down if she gets dizzy. He thinks it is related to orthostatic fall in BP.   Type 2 diabetes mellitus with stage 1 chronic kidney disease, without long-term current use of insulin (HCC) - controlled  Essential hypertension - orthostatic drops  Hyperlipidemia - controlled  Palpitations - improved  Chronic interstitial cystitis is getting worse. Increased frequency and urgency. Needs to see Dr. Lagos.   Medications: Patient's Medications  New Prescriptions   No medications on file  Previous Medications   AMLODIPINE (NORVASC) 2.5 MG TABLET    Take one tablet twice daily for blood pressure   ASPIRIN 81 MG TABLET    Take 81 mg by mouth daily.   ATENOLOL (TENORMIN) 50 MG TABLET    TAKE 1 TABLET DAILY TO HELPBLOOD PRESSURE   ATORVASTATIN (LIPITOR) 20 MG TABLET    Take one tablet once daily at bedtime  for cholesterol.   CALCIUM CARBONATE (TUMS EX) 750 MG CHEWABLE TABLET    Chew 1 tablet by mouth daily.   CITALOPRAM (CELEXA) 10 MG TABLET    Take 1/2 tablet by mouth once daily for depression   CITALOPRAM (CELEXA) 10 MG TABLET    TAKE 1/2 TABLET DAILY FOR  DEPRESSION   DEXTROMETHORPHAN-GUAIFENESIN (MUCINEX DM MAXIMUM STRENGTH PO)    Take by mouth.   METFORMIN (GLUCOPHAGE) 500 MG TABLET    TAKE 1 TABLET EVERY MORNINGFOR BLOOD  SUGAR   NAPROXEN SODIUM (ANAPROX) 220 MG TABLET    Take 220 mg by mouth at bedtime and may repeat dose one time if needed.   NITROFURANTOIN (MACRODANTIN) 100 MG CAPSULE    Take one capsule by mouth once daily for bladder   VITAMIN D, ERGOCALCIFEROL, (DRISDOL) 50000 UNITS CAPS CAPSULE    TAKE 1 CAPSULE WEEKLY FOR  VITAMIN D  Modified Medications   No medications on file  Discontinued Medications   No medications on file    Review of Systems  Constitutional: Positive for fatigue. Negative for fever.  HENT: Positive for hearing loss, rhinorrhea, sinus pressure and voice change (hoarse). Negative for trouble swallowing.   Eyes: Positive for visual disturbance (corrective lenses).  Respiratory: Positive for shortness of breath. Negative for cough, choking and chest tightness.        Hoarse  Cardiovascular: Positive for palpitations. Negative for chest pain and leg swelling.  Gastrointestinal: Negative.   Endocrine:       Diabetic  Genitourinary: Positive for dysuria and urgency.       Dribblling. Nocturia x 3. Chronic interstitial cystitis.  Musculoskeletal: Positive for arthralgias and back pain.       Right third finger trigger finger. Previously had trigger thumb on the right side that was repaired many many years ago.  Skin: Negative.   Neurological:   Negative for dizziness, tremors, seizures, syncope and weakness.  Hematological: Negative.   Psychiatric/Behavioral: Positive for suicidal ideas.       Insomnia. Difficulty falling asleep and early morning awakening.    Vitals:   11/01/15 1205  BP: 120/68  Pulse: (!) 54  Temp: 97.6 F (36.4 C)  TempSrc: Oral  SpO2: 96%  Weight: 170 lb (77.1 kg)  Height: 5' 8" (1.727 m)   Body mass index is 25.85 kg/m. Wt Readings from Last 3 Encounters:  11/01/15 170 lb (77.1 kg)  08/04/15 170 lb (77.1 kg)  06/27/15 172 lb (78 kg)      Physical Exam  Constitutional: She is oriented to person, place, and time. She appears  well-nourished. No distress.  HENT:  Head: Normocephalic and atraumatic.  Right Ear: External ear normal.  Left Ear: External ear normal.  Nose: Nose normal.  Mouth/Throat: Oropharynx is clear and moist. No oropharyngeal exudate.  Eyes: Conjunctivae and EOM are normal. Pupils are equal, round, and reactive to light.  Lens implant in both eyes.  Neck: No JVD present. No tracheal deviation present. No thyromegaly present.  Cardiovascular: Normal rate, regular rhythm, normal heart sounds and intact distal pulses.  Exam reveals no gallop and no friction rub.   No murmur heard. Pulmonary/Chest: No respiratory distress. She has no wheezes. She has no rales. She exhibits no tenderness.  Fibrosis and thickening of both breasts  Abdominal: She exhibits no distension and no mass. There is no tenderness.  Musculoskeletal: She exhibits no tenderness.  Trigger finger right third finger  Lymphadenopathy:    She has no cervical adenopathy.  Neurological: She is alert and oriented to person, place, and time. She has normal reflexes.  Reduced vibratory sensation of both feet 12/21/13 MMSE 29/30. Passed clock drawing.  Skin: No rash noted. No erythema. No pallor.  Dry  Psychiatric: She has a normal mood and affect. Her behavior is normal. Judgment and thought content normal.    Labs reviewed: Lab Summary Latest Ref Rng & Units 10/30/2015 06/23/2015 02/22/2015  Hemoglobin - (None) (None) (None)  Hematocrit - (None) (None) (None)  White count - (None) (None) (None)  Platelet count - (None) (None) (None)  Sodium 135 - 146 mmol/L 137 139 139  Potassium 3.5 - 5.3 mmol/L 4.7 4.3 4.4  Calcium 8.6 - 10.4 mg/dL 9.4 9.3 9.5  Phosphorus - (None) (None) (None)  Creatinine 0.60 - 0.88 mg/dL 1.19(H) 1.13(H) 1.04(H)  AST 10 - 35 U/L 15 15 (None)  Alk Phos 33 - 130 U/L 75 91 (None)  Bilirubin 0.2 - 1.2 mg/dL 0.5 0.3 (None)  Glucose 65 - 99 mg/dL 130(H) 135(H) 132(H)  Cholesterol 125 - 200 mg/dL 158 (None)  (None)  HDL cholesterol >=46 mg/dL 43(L) 43 41  Triglycerides <150 mg/dL 226(H) 204(H) 234(H)  LDL Direct - (None) (None) (None)  LDL Calc <130 mg/dL 70 75 84  Total protein 6.1 - 8.1 g/dL 6.2 (None) (None)  Albumin 3.6 - 5.1 g/dL 4.0 3.9 (None)  Some recent data might be hidden   No results found for: TSH, T3TOTAL, T4TOTAL, THYROIDAB Lab Results  Component Value Date   BUN 22 10/30/2015   BUN 16 06/23/2015   BUN 17 02/22/2015   Lab Results  Component Value Date   HGBA1C 6.8 (H) 10/30/2015   HGBA1C 7.0 (H) 06/23/2015   HGBA1C 6.8 (H) 02/22/2015    Assessment/Plan  1. Type 2 diabetes mellitus with stage 1 chronic kidney disease, without long-term current use of insulin (  HCC) - Hemoglobin A1c; Future - CMP  2. Essential hypertension - CMP  3. Hyperlipidemia - Lipid panel; Future  4. Palpitations improved  5. Chronic interstitial cystitis - Urine culture - Urinalysis  

## 2015-11-02 ENCOUNTER — Other Ambulatory Visit: Payer: Self-pay | Admitting: Internal Medicine

## 2015-11-02 DIAGNOSIS — I10 Essential (primary) hypertension: Secondary | ICD-10-CM

## 2015-11-02 DIAGNOSIS — E1122 Type 2 diabetes mellitus with diabetic chronic kidney disease: Secondary | ICD-10-CM | POA: Diagnosis not present

## 2015-11-02 DIAGNOSIS — N181 Chronic kidney disease, stage 1: Secondary | ICD-10-CM | POA: Diagnosis not present

## 2015-11-02 DIAGNOSIS — N3010 Interstitial cystitis (chronic) without hematuria: Secondary | ICD-10-CM | POA: Diagnosis not present

## 2015-11-03 LAB — URINALYSIS
Bilirubin Urine: NEGATIVE
GLUCOSE, UA: NEGATIVE
HGB URINE DIPSTICK: NEGATIVE
Ketones, ur: NEGATIVE
NITRITE: POSITIVE — AB
PH: 8 (ref 5.0–8.0)
Specific Gravity, Urine: 1.016 (ref 1.001–1.035)

## 2015-11-04 LAB — URINE CULTURE

## 2015-11-15 ENCOUNTER — Telehealth: Payer: Self-pay

## 2015-11-15 MED ORDER — LEVOFLOXACIN 500 MG PO TABS: 500.0000 mg | ORAL_TABLET | Freq: Every day | ORAL | 0 refills | Status: DC

## 2015-11-15 NOTE — Telephone Encounter (Signed)
-----   Message from Estill Dooms, MD sent at 11/15/2015 10:34 AM EDT ----- Call patient. Start levofloxacin 500 mg (7 tablets) one tablet daily for infection. Prescription will need to be called in to pharmacy.

## 2015-11-15 NOTE — Telephone Encounter (Signed)
Discussed results with patient, patient verbalized understanding of results. RX sent to pharmacy  

## 2015-11-21 ENCOUNTER — Ambulatory Visit: Payer: Medicare Other | Admitting: Podiatry

## 2015-11-23 ENCOUNTER — Encounter: Payer: Self-pay | Admitting: Cardiology

## 2015-12-04 NOTE — Progress Notes (Signed)
Cardiology Office Note   Date:  12/07/2015   ID:  Lodena, Marsico 1931-04-10, MRN [id number]  PCP:  Jeanmarie Hubert, MD  Cardiologist:   Minus Breeding, MD   Chief Complaint  Patient presents with  . Dizziness      History of Present Illness: Penny Miller is a 82 y.o. female who presents for evaluation of dizziness as described at the previous office visit.   At the last visit there was confusion about whether she was taking two beta blockers.  I prescribed compression stockings.    Since I saw her she was diagnosed with a UTI. Once this was treated she's felt much better. She's only rarely getting dizziness. She's not getting any palpitations, presyncope or syncope. She denies any chest pressure, neck or arm discomfort. She has no shortness of breath, PND or orthopnea.  Past Medical History:  Diagnosis Date  . Allergic rhinitis, cause unspecified   . Anxiety state, unspecified   . Diaphragmatic hernia without mention of obstruction or gangrene   . Diverticulosis of colon (without mention of hemorrhage)   . Female stress incontinence   . Insomnia, unspecified   . Interstitial cystitis    surgery 02/01/2013  . Irritable bowel syndrome   . Memory loss   . Other and unspecified hyperlipidemia   . Other premature beats   . Polyneuropathy in diabetes(357.2)   . Postmenopausal bleeding   . Proteinuria   . Type II or unspecified type diabetes mellitus with neurological manifestations, not stated as uncontrolled   . Unspecified essential hypertension   . Viral warts, unspecified     Past Surgical History:  Procedure Laterality Date  . ABDOMINAL HYSTERECTOMY  1977   Dr.Hambright  . APPENDECTOMY  1965  . CATARACT EXTRACTION, BILATERAL  02/10/2006   Dr.Groat   . COLONOSCOPY  07/17/2004   polypectomy  . [place]  . TRIGGER FINGER RELEASE  1990   Dr.Carter  . urinary bladder      surgery 02/01/2013 to stretch bladder stem     Current Outpatient  Prescriptions  Medication Sig Dispense Refill  . amLODipine (NORVASC) 2.5 MG tablet Take one tablet twice daily for blood pressure 180 tablet 3  . aspirin 81 MG tablet Take 81 mg by mouth daily.    Marland Kitchen atenolol (TENORMIN) 50 MG tablet TAKE 1 TABLET DAILY TO HELPBLOOD PRESSURE 90 tablet 1  . atorvastatin (LIPITOR) 20 MG tablet Take one tablet once daily at bedtime  for cholesterol. 90 tablet 3  . calcium carbonate (TUMS EX) 750 MG chewable tablet Chew 1 tablet by mouth daily.    . citalopram (CELEXA) 10 MG tablet TAKE 1/2 TABLET DAILY FOR  DEPRESSION 45 tablet 3  . Dextromethorphan-Guaifenesin (MUCINEX DM MAXIMUM STRENGTH PO) Take by mouth.    . metFORMIN (GLUCOPHAGE) 500 MG tablet TAKE 1 TABLET EVERY MORNINGFOR BLOOD SUGAR 90 tablet 1  . naproxen sodium (ANAPROX) 220 MG tablet Take 220 mg by mouth at bedtime and may repeat dose one time if needed.    . nitrofurantoin (MACRODANTIN) 100 MG capsule Take one capsule by mouth once daily for bladder 90 capsule 3  . Vitamin D, Ergocalciferol, (DRISDOL) 50000 UNITS CAPS capsule TAKE 1 CAPSULE WEEKLY FOR  VITAMIN D 12 capsule 1   No current facility-administered medications for this visit.     Allergies:   Ceclor [cefaclor]; Codeine; Lopid [gemfibrozil]; Metoprolol; Morphine and related; Niacin and related; Relafen [nabumetone]; Septra [sulfamethoxazole-trimethoprim]; Tetracycline; and  Toprol xl [metoprolol tartrate]   ROS:  Please see the history of present illness.   Otherwise, review of systems are positive for none.   All other systems are reviewed and negative.    PHYSICAL EXAM: VS:  BP (!) 162/72   Pulse (!) 58   Ht 5\' 8"  (1.727 m)   Wt 170 lb 12.8 oz (77.5 kg)   SpO2 97%   BMI 25.97 kg/m  , BMI Body mass index is 25.97 kg/m. GENERAL:  Well appearing NECK:  No jugular venous distention, waveform within normal limits, carotid upstroke brisk and symmetric, soft right carotid bruit, no left bruit, no thyromegaly LUNGS:  Clear to auscultation  bilaterally CHEST:  Unremarkable HEART:  PMI not displaced or sustained,S1 and S2 within normal limits, no S3, no S4, no clicks, no rubs, no murmurs ABD:  Flat, positive bowel sounds normal in frequency in pitch, no bruits, no rebound, no guarding, no midline pulsatile mass, no hepatomegaly, no splenomegaly EXT:  2 plus pulses throughout, no edema, no cyanosis no clubbin    EKG:  EKG is not ordered today.   Recent Labs: 10/30/2015: ALT 14; BUN 22; Creat 1.19; Potassium 4.7; Sodium 137    Lipid Panel    Component Value Date/Time   CHOL 158 10/30/2015 0856   CHOL 159 06/23/2015 0927   TRIG 226 (H) 10/30/2015 0856   HDL 43 (L) 10/30/2015 0856   HDL 43 06/23/2015 0927   CHOLHDL 3.7 10/30/2015 0856   VLDL 45 (H) 10/30/2015 0856   LDLCALC 70 10/30/2015 0856   LDLCALC 75 06/23/2015 0927      Wt Readings from Last 3 Encounters:  12/07/15 170 lb 12.8 oz (77.5 kg)  11/01/15 170 lb (77.1 kg)  08/04/15 170 lb (77.1 kg)      Other studies Reviewed: Additional studies/ records that were reviewed today include: None Review of the above records demonstrates:     ASSESSMENT AND PLAN:  DIZZINESS:  This seems to have resolved.  No change in therapy is planned.  She can still wear compression stockings when she is going to be out and about.   BRUIT:  She was to get a carotid Doppler at the last visit. She reports some mild carotid plaque years ago.   I will reorder this  PALPITATIONS:  This is no longer a problem.  No change in therapy is planned.   Current medicines are reviewed at length with the patient today.  The patient does not have concerns regarding medicines.  The following changes have been made:    Labs/ tests ordered today include:    Doppler  Disposition:   FU with me as needed.    Signed, Minus Breeding, MD  12/07/2015 10:44 AM    [facility]

## 2015-12-06 ENCOUNTER — Ambulatory Visit (INDEPENDENT_AMBULATORY_CARE_PROVIDER_SITE_OTHER): Payer: Medicare Other | Admitting: Podiatry

## 2015-12-06 DIAGNOSIS — B351 Tinea unguium: Secondary | ICD-10-CM | POA: Diagnosis not present

## 2015-12-06 DIAGNOSIS — M79676 Pain in unspecified toe(s): Secondary | ICD-10-CM

## 2015-12-06 NOTE — Patient Instructions (Signed)
Small dried blood blister on your left great toe without any clinical sign of infection. Apply Vaseline and a Band-Aid daily until the skin looks normal  Diabetes and Foot Care Diabetes may cause you to have problems because of poor blood supply (circulation) to your feet and legs. This may cause the skin on your feet to become thinner, break easier, and heal more slowly. Your skin may become dry, and the skin may peel and crack. You may also have nerve damage in your legs and feet causing decreased feeling in them. You may not notice minor injuries to your feet that could lead to infections or more serious problems. Taking care of your feet is one of the most important things you can do for yourself.  HOME CARE INSTRUCTIONS  Wear shoes at all times, even in the house. Do not go barefoot. Bare feet are easily injured.  Check your feet daily for blisters, cuts, and redness. If you cannot see the bottom of your feet, use a mirror or ask someone for help.  Wash your feet with warm water (do not use hot water) and mild soap. Then pat your feet and the areas between your toes until they are completely dry. Do not soak your feet as this can dry your skin.  Apply a moisturizing lotion or petroleum jelly (that does not contain alcohol and is unscented) to the skin on your feet and to dry, brittle toenails. Do not apply lotion between your toes.  Trim your toenails straight across. Do not dig under them or around the cuticle. File the edges of your nails with an emery board or nail file.  Do not cut corns or calluses or try to remove them with medicine.  Wear clean socks or stockings every day. Make sure they are not too tight. Do not wear knee-high stockings since they may decrease blood flow to your legs.  Wear shoes that fit properly and have enough cushioning. To break in new shoes, wear them for just a few hours a day. This prevents you from injuring your feet. Always look in your shoes before you  put them on to be sure there are no objects inside.  Do not cross your legs. This may decrease the blood flow to your feet.  If you find a minor scrape, cut, or break in the skin on your feet, keep it and the skin around it clean and dry. These areas may be cleansed with mild soap and water. Do not cleanse the area with peroxide, alcohol, or iodine.  When you remove an adhesive bandage, be sure not to damage the skin around it.  If you have a wound, look at it several times a day to make sure it is healing.  Do not use heating pads or hot water bottles. They may burn your skin. If you have lost feeling in your feet or legs, you may not know it is happening until it is too late.  Make sure your health care provider performs a complete foot exam at least annually or more often if you have foot problems. Report any cuts, sores, or bruises to your health care provider immediately. SEEK MEDICAL CARE IF:   You have an injury that is not healing.  You have cuts or breaks in the skin.  You have an ingrown nail.  You notice redness on your legs or feet.  You feel burning or tingling in your legs or feet.  You have pain or cramps in your  legs and feet.  Your legs or feet are numb.  Your feet always feel cold. SEEK IMMEDIATE MEDICAL CARE IF:   There is increasing redness, swelling, or pain in or around a wound.  There is a red line that goes up your leg.  Pus is coming from a wound.  You develop a fever or as directed by your health care provider.  You notice a bad smell coming from an ulcer or wound.   This information is not intended to replace advice given to you by your health care provider. Make sure you discuss any questions you have with your health care provider.   Document Released: 03/15/2000 Document Revised: 11/18/2012 Document Reviewed: 08/25/2012 Elsevier Interactive Patient Education [employer].

## 2015-12-06 NOTE — Progress Notes (Signed)
Patient ID: Penny Miller, female   DOB: 1931-12-08, 82 y.o.   MRN: [id number]   Subjective: This patient presents again for scheduled visit complaining of uncomfortable toenails and walking wearing shoes and requests nail debridement  Objective: No Open skin lesions bilaterally The toenails are elongated, incurvated, discolored, hypertrophic and tender to direct palpation 6-10 5 mm dry blood blister, nonraised without an inflammatory base, surrounding erythema, warmth or drainage, medial left hallux  Assessment: Type II diabetic with peripheral neuropathy Onychomycoses 6-10 with symptoms Old friction blister left hallux without clinical sign of infection  Plan: Debridement toenails 6-10 mechanically and electronically without any bleeding Struck patient apply Vaseline and Band-Aid to dry blood blister left hallux and cover with a Band-Aid daily until the skin looks normal  Reappoint 3 months

## 2015-12-07 ENCOUNTER — Encounter: Payer: Self-pay | Admitting: Cardiology

## 2015-12-07 ENCOUNTER — Ambulatory Visit (INDEPENDENT_AMBULATORY_CARE_PROVIDER_SITE_OTHER): Payer: Medicare Other | Admitting: Cardiology

## 2015-12-07 VITALS — BP 162/72 | HR 58 | Ht 68.0 in | Wt 170.8 lb

## 2015-12-07 DIAGNOSIS — I6523 Occlusion and stenosis of bilateral carotid arteries: Secondary | ICD-10-CM

## 2015-12-07 NOTE — Patient Instructions (Addendum)
Medication Instructions:  Continue current medications  Labwork: None Ordered  Testing/Procedures: Your physician has requested that you have a carotid duplex. This test is an ultrasound of the carotid arteries in your neck. It looks at blood flow through these arteries that supply the brain with blood. Allow one hour for this exam. There are no restrictions or special instructions.  Follow-Up: Your physician recommends that you schedule a follow-up appointment in: As Needed   Any Other Special Instructions Will Be Listed Below (If Applicable).   If you need a refill on your cardiac medications before your next appointment, please call your pharmacy.

## 2015-12-13 ENCOUNTER — Ambulatory Visit (HOSPITAL_COMMUNITY)
Admission: RE | Admit: 2015-12-13 | Discharge: 2015-12-13 | Disposition: A | Discharge status: Home / Self Care | Payer: Medicare Other | Source: Ambulatory Visit | Attending: Cardiovascular Disease | Admitting: Cardiovascular Disease

## 2015-12-13 DIAGNOSIS — I119 Hypertensive heart disease without heart failure: Secondary | ICD-10-CM | POA: Diagnosis not present

## 2015-12-13 DIAGNOSIS — Z87891 Personal history of nicotine dependence: Secondary | ICD-10-CM | POA: Insufficient documentation

## 2015-12-13 DIAGNOSIS — E785 Hyperlipidemia, unspecified: Secondary | ICD-10-CM | POA: Insufficient documentation

## 2015-12-13 DIAGNOSIS — I6523 Occlusion and stenosis of bilateral carotid arteries: Secondary | ICD-10-CM | POA: Insufficient documentation

## 2015-12-13 DIAGNOSIS — I10 Essential (primary) hypertension: Secondary | ICD-10-CM | POA: Diagnosis not present

## 2015-12-26 DIAGNOSIS — E119 Type 2 diabetes mellitus without complications: Secondary | ICD-10-CM | POA: Diagnosis not present

## 2015-12-26 LAB — HM DIABETES EYE EXAM

## 2015-12-28 ENCOUNTER — Encounter: Payer: Self-pay | Admitting: *Deleted

## 2016-02-02 ENCOUNTER — Ambulatory Visit (INDEPENDENT_AMBULATORY_CARE_PROVIDER_SITE_OTHER): Payer: Medicare Other

## 2016-02-02 DIAGNOSIS — Z23 Encounter for immunization: Secondary | ICD-10-CM

## 2016-02-12 ENCOUNTER — Other Ambulatory Visit: Payer: Self-pay | Admitting: Internal Medicine

## 2016-03-06 ENCOUNTER — Encounter: Payer: Self-pay | Admitting: Podiatry

## 2016-03-06 ENCOUNTER — Ambulatory Visit (INDEPENDENT_AMBULATORY_CARE_PROVIDER_SITE_OTHER): Payer: Medicare Other | Admitting: Podiatry

## 2016-03-06 DIAGNOSIS — B351 Tinea unguium: Secondary | ICD-10-CM | POA: Diagnosis not present

## 2016-03-06 DIAGNOSIS — M79676 Pain in unspecified toe(s): Secondary | ICD-10-CM

## 2016-03-06 NOTE — Progress Notes (Signed)
Patient ID: Penny Miller, female   DOB: June 18, 1931, 82 y.o.   MRN: [id number]    Subjective: This patient presents again for scheduled visit complaining of uncomfortable toenails and walking wearing shoes and requests nail debridement  Objective: Orientated 3 No peripheral edema bilaterally Spider varicosities dorsal ankle feet bilaterally DP and PT pulses 2/4 bilaterally Reflex delay bilaterally Sensation to 10 g monofilament wire intact 4/5 bilaterally Vibratory sensation reactive Ankle reflex equal and reactive bilaterally No Open skin lesions bilaterally The toenails are elongated, incurvated, discolored, hypertrophic and tender to direct palpation 6-10 3 mm dry blood blister, nonraised without an inflammatory base, surrounding erythema, warmth or drainage, medial left hallux. This area is reducing from a 5 mm. Using a general debridement demonstrates beginning flaking of the underlying dry skin. Manual motor testing: Dorsi flexion, plantar flexion, inversion, eversion 5/5 bilaterally  Assessment: Type II diabetic with history of peripheral neuropathy Protective sensation intact bilaterally Onychomycoses 6-10 with symptoms Old friction blister left hallux without clinical sign of infection  Plan: Debridement toenails 6-10 mechanically and electronically without any bleeding  Reappoint 3 months

## 2016-03-06 NOTE — Patient Instructions (Signed)

## 2016-03-11 DIAGNOSIS — N3020 Other chronic cystitis without hematuria: Secondary | ICD-10-CM | POA: Diagnosis not present

## 2016-03-11 DIAGNOSIS — R8290 Unspecified abnormal findings in urine: Secondary | ICD-10-CM | POA: Diagnosis not present

## 2016-03-11 DIAGNOSIS — N3010 Interstitial cystitis (chronic) without hematuria: Secondary | ICD-10-CM | POA: Diagnosis not present

## 2016-03-15 ENCOUNTER — Other Ambulatory Visit: Payer: Self-pay | Admitting: Internal Medicine

## 2016-04-01 DIAGNOSIS — I639 Cerebral infarction, unspecified: Secondary | ICD-10-CM

## 2016-04-01 HISTORY — DX: Cerebral infarction, unspecified: I63.9

## 2016-05-06 ENCOUNTER — Other Ambulatory Visit: Payer: Medicare Other

## 2016-05-07 ENCOUNTER — Ambulatory Visit (INDEPENDENT_AMBULATORY_CARE_PROVIDER_SITE_OTHER): Payer: Medicare Other

## 2016-05-07 ENCOUNTER — Other Ambulatory Visit: Payer: Medicare Other

## 2016-05-07 VITALS — BP 156/70 | HR 55 | Temp 98.2°F | Ht 68.0 in | Wt 169.2 lb

## 2016-05-07 DIAGNOSIS — I10 Essential (primary) hypertension: Secondary | ICD-10-CM | POA: Diagnosis not present

## 2016-05-07 DIAGNOSIS — Z23 Encounter for immunization: Secondary | ICD-10-CM

## 2016-05-07 DIAGNOSIS — E785 Hyperlipidemia, unspecified: Secondary | ICD-10-CM | POA: Diagnosis not present

## 2016-05-07 DIAGNOSIS — Z0000 Encounter for general adult medical examination without abnormal findings: Secondary | ICD-10-CM | POA: Diagnosis not present

## 2016-05-07 DIAGNOSIS — E1122 Type 2 diabetes mellitus with diabetic chronic kidney disease: Secondary | ICD-10-CM | POA: Diagnosis not present

## 2016-05-07 DIAGNOSIS — N181 Chronic kidney disease, stage 1: Principal | ICD-10-CM

## 2016-05-07 LAB — COMPLETE METABOLIC PANEL WITH GFR
ALBUMIN: 3.9 g/dL (ref 3.6–5.1)
ALK PHOS: 69 U/L (ref 33–130)
ALT: 14 U/L (ref 6–29)
AST: 16 U/L (ref 10–35)
BILIRUBIN TOTAL: 0.4 mg/dL (ref 0.2–1.2)
BUN: 19 mg/dL (ref 7–25)
CO2: 26 mmol/L (ref 20–31)
Calcium: 9.7 mg/dL (ref 8.6–10.4)
Chloride: 104 mmol/L (ref 98–110)
Creat: 1.22 mg/dL — ABNORMAL HIGH (ref 0.60–0.88)
GFR, Est African American: 48 mL/min — ABNORMAL LOW (ref >=60)
GFR, Est Non African American: 41 mL/min — ABNORMAL LOW (ref >=60)
GLUCOSE: 132 mg/dL — AB (ref 65–99)
Potassium: 4.4 mmol/L (ref 3.5–5.3)
SODIUM: 137 mmol/L (ref 135–146)
TOTAL PROTEIN: 6.2 g/dL (ref 6.1–8.1)

## 2016-05-07 LAB — HEMOGLOBIN A1C
HEMOGLOBIN A1C: 6.3 % — AB (ref <5.7)
MEAN PLASMA GLUCOSE: 134 mg/dL

## 2016-05-07 LAB — LIPID PANEL
Cholesterol: 151 mg/dL (ref <200)
HDL: 42 mg/dL — ABNORMAL LOW (ref >50)
LDL Cholesterol: 65 mg/dL (ref <100)
Total CHOL/HDL Ratio: 3.6 Ratio (ref <5.0)
Triglycerides: 218 mg/dL — ABNORMAL HIGH (ref <150)
VLDL: 44 mg/dL — ABNORMAL HIGH (ref <30)

## 2016-05-07 NOTE — Patient Instructions (Addendum)
Penny Miller , Thank you for taking time to come for your Medicare Wellness Visit. I appreciate your ongoing commitment to your health goals. Please review the following plan we discussed and let me know if I can assist you in the future.   These are the goals we discussed: Goals    . Weight (lb) < 165 lb (74.8 kg)          Starting 05/07/2016, I will attempt to decrease my weight to reach my goal weight of 165 lbs.       This is a list of the screening recommended for you and due dates:  Health Maintenance  Topic Date Due  . Complete foot exam   06/22/2015  . Hemoglobin A1C  05/01/2016  . Shingles Vaccine  04/01/2017*  . Urine Protein Check  10/29/2016  . Eye exam for diabetics  12/25/2016  . Pneumonia vaccines (2 of 2 - PPSV23) 05/07/2017  . Tetanus Vaccine  09/15/2018  . Flu Shot  Completed  . DEXA scan (bone density measurement)  Completed  *Topic was postponed. The date shown is not the original due date.  Preventive Care for Adults  A healthy lifestyle and preventive care can promote health and wellness. Preventive health guidelines for adults include the following key practices.  . A routine yearly physical is a good way to check with your health care provider about your health and preventive screening. It is a chance to share any concerns and updates on your health and to receive a thorough exam.  . Visit your dentist for a routine exam and preventive care every 6 months. Brush your teeth twice a day and floss once a day. Good oral hygiene prevents tooth decay and gum disease.  . The frequency of eye exams is based on your age, health, family medical history, use  of contact lenses, and other factors. Follow your health care provider's ecommendations for frequency of eye exams.  . Eat a healthy diet. Foods like vegetables, fruits, whole grains, low-fat dairy products, and lean protein foods contain the nutrients you need without too many calories. Decrease your intake of foods  high in solid fats, added sugars, and salt. Eat the right amount of calories for you. Get information about a proper diet from your health care provider, if necessary.  . Regular physical exercise is one of the most important things you can do for your health. Most adults should get at least 150 minutes of moderate-intensity exercise (any activity that increases your heart rate and causes you to sweat) each week. In addition, most adults need muscle-strengthening exercises on 2 or more days a week.  Silver Sneakers may be a benefit available to you. To determine eligibility, you may visit the website: www.silversneakers.com or contact program at 252-794-4132 Mon-Fri between 8AM-8PM.   . Maintain a healthy weight. The body mass index (BMI) is a screening tool to identify possible weight problems. It provides an estimate of body fat based on height and weight. Your health care provider can find your BMI and can help you achieve or maintain a healthy weight.   For adults 20 years and older: ? A BMI below 18.5 is considered underweight. ? A BMI of 18.5 to 24.9 is normal. ? A BMI of 25 to 29.9 is considered overweight. ? A BMI of 30 and above is considered obese.   . Maintain normal blood lipids and cholesterol levels by exercising and minimizing your intake of saturated fat. Eat a balanced diet with  plenty of fruit and vegetables. Blood tests for lipids and cholesterol should begin at age 41 and be repeated every 5 years. If your lipid or cholesterol levels are high, you are over 50, or you are at high risk for heart disease, you may need your cholesterol levels checked more frequently. Ongoing high lipid and cholesterol levels should be treated with medicines if diet and exercise are not working.  . If you smoke, find out from your health care provider how to quit. If you do not use tobacco, please do not start.  . If you choose to drink alcohol, please do not consume more than 2 drinks per day.  One drink is considered to be 12 ounces (355 mL) of beer, 5 ounces (148 mL) of wine, or 1.5 ounces (44 mL) of liquor.  . If you are 70-21 years old, ask your health care provider if you should take aspirin to prevent strokes.  . Use sunscreen. Apply sunscreen liberally and repeatedly throughout the day. You should seek shade when your shadow is shorter than you. Protect yourself by wearing long sleeves, pants, a wide-brimmed hat, and sunglasses year round, whenever you are outdoors.  . Once a month, do a whole body skin exam, using a mirror to look at the skin on your back. Tell your health care provider of new moles, moles that have irregular borders, moles that are larger than a pencil eraser, or moles that have changed in shape or color.

## 2016-05-07 NOTE — Progress Notes (Signed)
Subjective:   Penny Miller is a 83 y.o. female who presents for an Initial Medicare Annual Wellness Visit.  Review of Systems     Cardiac Risk Factors include: advanced age (>65men, >89 women);diabetes mellitus;dyslipidemia;family history of premature cardiovascular disease;hypertension;smoking/ tobacco exposure     Objective:    Today's Vitals   05/07/16 0900 05/07/16 0901  BP: (!) 156/70   Pulse: (!) 55   Temp: 98.2 F (36.8 C)   TempSrc: Oral   SpO2: 95%   Weight: 169 lb 3.2 oz (76.7 kg)   Height: 5\' 8"  (1.727 m)   PainSc:  5    Body mass index is 25.73 kg/m.   Current Medications (verified) Outpatient Encounter Prescriptions as of 05/07/2016  Medication Sig  . amLODipine (NORVASC) 2.5 MG tablet Take one tablet twice daily for blood pressure  . aspirin 81 MG tablet Take 81 mg by mouth daily.  Marland Kitchen atenolol (TENORMIN) 50 MG tablet TAKE 1 TABLET DAILY TO HELPBLOOD PRESSURE  . atorvastatin (LIPITOR) 20 MG tablet Take one tablet once daily at bedtime  for cholesterol.  . calcium carbonate (TUMS EX) 750 MG chewable tablet Chew 1 tablet by mouth daily.  . citalopram (CELEXA) 10 MG tablet TAKE 1/2 TABLET DAILY FOR  DEPRESSION  . Dextromethorphan-Guaifenesin (MUCINEX DM MAXIMUM STRENGTH PO) Take by mouth.  . metFORMIN (GLUCOPHAGE) 500 MG tablet TAKE 1 TABLET EVERY MORNINGFOR BLOOD SUGAR  . naproxen sodium (ANAPROX) 220 MG tablet Take 220 mg by mouth at bedtime and may repeat dose one time if needed.  . nitrofurantoin (MACRODANTIN) 100 MG capsule Take one capsule by mouth once daily for bladder  . Vitamin D, Ergocalciferol, (DRISDOL) 50000 units CAPS capsule TAKE 1 CAPSULE WEEKLY FOR  VITAMIN D   No facility-administered encounter medications on file as of 05/07/2016.     Allergies (verified) Ceclor [cefaclor]; Codeine; Lopid [gemfibrozil]; Metoprolol; Morphine and related; Niacin and related; Relafen [nabumetone]; Septra [sulfamethoxazole-trimethoprim]; Tetracycline; and Toprol  xl [metoprolol tartrate]   History: Past Medical History:  Diagnosis Date  . Allergic rhinitis, cause unspecified   . Anxiety state, unspecified   . Diaphragmatic hernia without mention of obstruction or gangrene   . Diverticulosis of colon (without mention of hemorrhage)   . Female stress incontinence   . Insomnia, unspecified   . Interstitial cystitis    surgery 02/01/2013  . Irritable bowel syndrome   . Memory loss   . Other and unspecified hyperlipidemia   . Other premature beats   . Polyneuropathy in diabetes(357.2)   . Postmenopausal bleeding   . Proteinuria   . 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 Unspecified essential hypertension   . Viral warts, unspecified    Past Surgical History:  Procedure Laterality Date  . ABDOMINAL HYSTERECTOMY  1977   Dr.Hambright  . APPENDECTOMY  1965  . CATARACT EXTRACTION, BILATERAL  02/10/2006   Dr.Groat   . COLONOSCOPY  07/17/2004   polypectomy  . [place]  . TRIGGER FINGER RELEASE  1990   Dr.Carter  . TRIGGER FINGER RELEASE  05/13/16  . urinary bladder      surgery 02/01/2013 to stretch bladder stem   Family History  Problem Relation Age of Onset  . Cancer Mother     pancreatic  . Cancer Father     bladder  . Hypertension Sister   . Diabetes Sister   . Diabetes Brother   . Kidney cancer  Brother   . Hypertension Brother   . Diabetes Brother   . Heart disease Brother 77    Myocardial Infarction    Social History   Occupational History  . 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 Smoking status: Former Smoker    Quit date: 02/25/2011  . Smokeless tobacco: Never Used  . Alcohol use No  . Drug use: No  . Sexual activity: No    Tobacco Counseling Counseling given: No   Activities of Daily Living In your present state of health, do you have any difficulty performing the following activities:  05/07/2016  Hearing? Y  Vision? N  Difficulty concentrating or making decisions? Y  Walking or climbing stairs? N  Dressing or bathing? N  Doing errands, shopping? N  Preparing Food and eating ? N  Using the Toilet? N  In the past six months, have you accidently leaked urine? Y  Do you have problems with loss of bowel control? N  Managing your Medications? N  Managing your Finances? N  Housekeeping or managing your Housekeeping? N  Some recent data might be hidden    Immunizations and Health Maintenance Immunization History  Administered Date(s) Administered  . Influenza,inj,Quad PF,36+ Mos 12/15/2012, 12/21/2013, 02/28/2015, 02/02/2016  . Influenza-Unspecified 01/22/2012  . Pneumococcal Conjugate-13 05/07/2016  . Pneumococcal-Unspecified 01/24/1995  . Td 09/14/2008   Health Maintenance Due  Topic Date Due  . FOOT EXAM  06/22/2015  . HEMOGLOBIN A1C  05/01/2016    Patient Care Team: Estill Dooms, MD as PCP - General (Internal Medicine) Ulice Bold, MD as Referring Physician (Dermatology) Lennon Alstrom, MD as Consulting Physician (Neurology) Sharyne Peach, MD as Consulting Physician (Ophthalmology) Gean Birchwood, DPM as Consulting Physician (Podiatry) Ronald Lobo, MD as Consulting Physician (Gastroenterology) Selinda Orion, MD (Inactive) as Consulting Physician (Gynecology) Druscilla Brownie, MD as Consulting Physician (Dermatology) Clent Jacks, MD as Consulting Physician (Ophthalmology) Myrlene Broker, MD as Attending Physician (Urology)  Indicate any recent Medical Services you may have received from other than [facility] providers in the past year (date may be approximate).     Assessment:   This is a routine wellness examination for Penny Miller.  Hearing/Vision screen Hearing Screening Comments: Last hearing screen was done several years ago. Pt states she has noticed a decrease in hearing but does not want to pursue anything at this time.  Vision Screening  Comments: Last eye exam was done in Oct. 2017 with Dr. Delman Cheadle.    Dietary issues and exercise activities discussed: Current Exercise Habits: Home exercise routine, Type of exercise: walking, Time (Minutes): 30, Frequency (Times/Week): 2, Weekly Exercise (Minutes/Week): 60, Intensity: Mild, Exercise limited by: cardiac condition(s)  Goals    . Weight (lb) < 165 lb (74.8 kg)          Starting 05/07/2016, I will attempt to decrease my weight to reach my goal weight of 165 lbs.      Depression Screen PHQ 2/9 Scores 05/07/2016 11/01/2015 06/25/2014 01/03/2014 12/21/2013 06/15/2013 08/28/2012  PHQ - 2 Score 0 0 0 0 0 0 0    Fall Risk Fall Risk  05/07/2016 11/01/2015 06/27/2015 02/28/2015 10/19/2014  Falls in the past year? Yes Yes Yes Yes No  Number falls in past yr: 2 or more 1 1 1  -  Injury with Fall? No No No No -  Follow up Falls prevention discussed - - - -    Cognitive Function: MMSE - Mini  Mental State Exam 05/07/2016 12/21/2013  Orientation to time 5 5  Orientation to Place 4 4  Registration 3 3  Attention/ Calculation 5 5  Recall 3 2  Language- name 2 objects 2 2  Language- repeat 1 1  Language- follow 3 step command 3 3  Language- read & follow direction 1 1  Write a sentence 1 1  Copy design 1 1  Total score 29 28        Screening Tests Health Maintenance  Topic Date Due  . FOOT EXAM  06/22/2015  . HEMOGLOBIN A1C  05/01/2016  . ZOSTAVAX  04/01/2017 (Originally 09/23/1993)  . URINE MICROALBUMIN  10/29/2016  . OPHTHALMOLOGY EXAM  12/25/2016  . PNA vac Low Risk Adult (2 of 2 - PPSV23) 05/07/2017  . TETANUS/TDAP  09/15/2018  . INFLUENZA VACCINE  Completed  . DEXA SCAN  Completed      Plan:    I have personally reviewed and addressed the Medicare Annual Wellness questionnaire and have noted the following in the patient's chart:  A. Medical and social history B. Use of alcohol, tobacco or illicit drugs  C. Current medications and supplements D. Functional ability and status E.   Nutritional status F.  Physical activity G. Advance directives H. List of other physicians I.  Hospitalizations, surgeries, and ER visits in previous 12 months J.  [place] to include hearing, vision, cognitive, depression L. Referrals and appointments - none  In addition, I have reviewed and discussed with patient certain preventive protocols, quality metrics, and best practice recommendations. A written personalized care plan for preventive services as well as general preventive health recommendations were provided to patient.  See attached scanned questionnaire for additional information.   Signed,   Allyn Kenner, LPN Health Advisor   I have reviewed the information entered by the Health Advisor. I was present in the office during the time of patient interaction and was available for consultation. I agree with the documentation and advice.  Viviann Spare Nyoka Cowden, MD

## 2016-05-07 NOTE — Progress Notes (Signed)
Quick Notes   Health Maintenance:  Due for foot exam    Abnormal Screen: None; MMSE-20/30 Passed Clock Test    Patient Concerns:  Pt is having surgery on her right finger on 05/13/16 and she would like to discuss the meds that she has been told to stop prior to surgery.     Nurse Concerns:  None  I have reviewed the information entered by the Health Advisor. I was present in the office during the time of patient interaction and was available for consultation. I agree with the documentation and advice.  Viviann Spare Nyoka Cowden, MD

## 2016-05-08 ENCOUNTER — Ambulatory Visit (INDEPENDENT_AMBULATORY_CARE_PROVIDER_SITE_OTHER): Payer: Medicare Other | Admitting: Internal Medicine

## 2016-05-08 ENCOUNTER — Encounter: Payer: Self-pay | Admitting: Internal Medicine

## 2016-05-08 VITALS — BP 142/80 | HR 57 | Temp 97.6°F | Ht 68.0 in | Wt 171.0 lb

## 2016-05-08 DIAGNOSIS — I10 Essential (primary) hypertension: Secondary | ICD-10-CM | POA: Diagnosis not present

## 2016-05-08 DIAGNOSIS — E1122 Type 2 diabetes mellitus with diabetic chronic kidney disease: Secondary | ICD-10-CM

## 2016-05-08 DIAGNOSIS — N182 Chronic kidney disease, stage 2 (mild): Secondary | ICD-10-CM

## 2016-05-08 DIAGNOSIS — N183 Chronic kidney disease, stage 3 unspecified: Secondary | ICD-10-CM | POA: Insufficient documentation

## 2016-05-08 DIAGNOSIS — N1832 Chronic kidney disease, stage 3b: Secondary | ICD-10-CM | POA: Insufficient documentation

## 2016-05-08 DIAGNOSIS — E785 Hyperlipidemia, unspecified: Secondary | ICD-10-CM

## 2016-05-08 DIAGNOSIS — N181 Chronic kidney disease, stage 1: Secondary | ICD-10-CM | POA: Diagnosis not present

## 2016-05-08 DIAGNOSIS — R413 Other amnesia: Secondary | ICD-10-CM | POA: Diagnosis not present

## 2016-05-08 DIAGNOSIS — M65331 Trigger finger, right middle finger: Secondary | ICD-10-CM | POA: Diagnosis not present

## 2016-05-08 NOTE — Progress Notes (Signed)
Patient ID: Penny Miller, female   DOB: Feb 24, 1932, 83 y.o.   MRN: [id number]    HISTORY AND PHYSICAL  Location:    [facility]   Place of Service:   OFFICE  Extended Emergency Contact Information Primary Emergency Contact: Piatt,[name] Address: [address], [place] Phone: [phone] Relation: Son  Advanced Directive information Does Patient Have a Medical Advance Directive?: No  Chief Complaint  Patient presents with  . Annual Exam    Physical  . Medical Management of Chronic Issues    medication management blood sugar, blood pressure, CKD, cholesterol.  . fingers locked    on right hand, Dr. Amedeo Plenty surgery 05/13/16     HPI:   Type 2 diabetes mellitus with stage 1 chronic kidney disease, without long-term current use of insulin ([facility]) - controlled  Essential hypertension - controlled  Hyperlipidemia, unspecified hyperlipidemia type - controlled  CKD (chronic kidney disease) stage 2, GFR 60-89 ml/min - unchanged and mild  Trigger middle finger of right hand - surgicsal repair by Dr. Sudie Bailey on 05/13/16.  Memory loss - she thinks about the samel her son is helping her keep track of more things.    Past Medical History:  Diagnosis Date  . Allergic rhinitis, cause unspecified   . Anxiety state, unspecified   . Diaphragmatic hernia without mention of obstruction or gangrene   . Diverticulosis of colon (without mention of hemorrhage)   . Female stress incontinence   . Insomnia, unspecified   . Interstitial cystitis    surgery 02/01/2013  . Irritable bowel syndrome   . Memory loss   . Other and unspecified hyperlipidemia   . Other premature beats   . Polyneuropathy in diabetes(357.2)   . Postmenopausal bleeding   . Proteinuria   . 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 Unspecified essential hypertension   . Viral warts, unspecified      Past Surgical History:  Procedure Laterality Date  . ABDOMINAL HYSTERECTOMY  1977   Dr.Hambright  . APPENDECTOMY  1965  . CATARACT EXTRACTION, BILATERAL  02/10/2006   Dr.Groat   . COLONOSCOPY  07/17/2004   polypectomy  . [place]  . TRIGGER FINGER RELEASE  1990   Dr.Carter  . TRIGGER FINGER RELEASE  05/13/16  . urinary bladder      surgery 02/01/2013 to stretch bladder stem    Patient Care Team: Estill Dooms, MD as PCP - General (Internal Medicine) Ulice Bold, MD as Referring Physician (Dermatology) Lennon Alstrom, MD as Consulting Physician (Neurology) Sharyne Peach, MD as Consulting Physician (Ophthalmology) Gean Birchwood, DPM as Consulting Physician (Podiatry) Ronald Lobo, MD as Consulting Physician (Gastroenterology) Selinda Orion, MD (Inactive) as Consulting Physician (Gynecology) Druscilla Brownie, MD as Consulting Physician (Dermatology) Clent Jacks, MD as Consulting Physician (Ophthalmology) Myrlene Broker, MD as Attending Physician (Urology) Minus Breeding, MD as Consulting Physician (Cardiology)  Social History   Social History  . Marital status: Widowed    Spouse name: N/A  . Number of children: 1  . Years of education: N/A   Occupational History  . 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 Smoking status: Former Smoker    Quit date: 02/25/2011  . Smokeless tobacco: Never Used  . Alcohol use No  . Drug use: No  .  Sexual activity: No   Other Topics Concern  . Not on file   Social History Narrative   Ophthalmologist-Dr.Gould and Dr.Groat   Podiatrist- Dr.Tuchman   Dermatologist- Dr.Lupton   Urologist- Dr. Lawerance Bach    Lives with her son.        reports that she quit smoking about 5 years ago. She has never used smokeless tobacco. She reports that she does not drink alcohol or use drugs.  Family History  Problem Relation Age of Onset  . Cancer Mother     pancreatic  . Cancer Father      bladder  . Hypertension Sister   . Diabetes Sister   . Diabetes Brother   . Kidney cancer Brother   . Hypertension Brother   . Diabetes Brother   . Heart disease Brother 72    Myocardial Infarction    Family Status  Relation Status  . Mother Deceased  . Father Deceased  . Sister Alive  . Brother Deceased   Accidental  . Son Alive  . Brother Deceased   Complications of Diabetes   . Brother Deceased at age 58   MI  . Brother Deceased at age 6   Accidental   . Brother   . Brother     Immunization History  Administered Date(s) Administered  . Influenza,inj,Quad PF,36+ Mos 12/15/2012, 12/21/2013, 02/28/2015, 02/02/2016  . Influenza-Unspecified 01/22/2012  . Pneumococcal Conjugate-13 05/07/2016  . Pneumococcal-Unspecified 01/24/1995  . Td 09/14/2008    Allergies  Allergen Reactions  . Ceclor [Cefaclor] Other (See Comments)  . Codeine   . Lopid [Gemfibrozil]   . Metoprolol Nausea And Vomiting  . Morphine And Related   . Niacin And Related   . Relafen [Nabumetone] Nausea And Vomiting  . Septra [Sulfamethoxazole-Trimethoprim] Nausea And Vomiting  . Tetracycline Nausea And Vomiting  . Toprol Xl [Metoprolol Tartrate]     Medications: Patient's Medications  New Prescriptions   No medications on file  Previous Medications   AMLODIPINE (NORVASC) 2.5 MG TABLET    Take one tablet twice daily for blood pressure   ASPIRIN 81 MG TABLET    Take 81 mg by mouth daily.   ATENOLOL (TENORMIN) 50 MG TABLET    TAKE 1 TABLET DAILY TO HELPBLOOD PRESSURE   ATORVASTATIN (LIPITOR) 20 MG TABLET    Take one tablet once daily at bedtime  for cholesterol.   CALCIUM CARBONATE (TUMS EX) 750 MG CHEWABLE TABLET    Chew 1 tablet by mouth daily.   CITALOPRAM (CELEXA) 10 MG TABLET    TAKE 1/2 TABLET DAILY FOR  DEPRESSION   DEXTROMETHORPHAN-GUAIFENESIN (MUCINEX DM MAXIMUM STRENGTH PO)    Take by mouth.   METFORMIN (GLUCOPHAGE) 500 MG TABLET    TAKE 1 TABLET EVERY MORNINGFOR BLOOD SUGAR    NAPROXEN SODIUM (ANAPROX) 220 MG TABLET    Take 220 mg by mouth at bedtime and may repeat dose one time if needed.   NITROFURANTOIN (MACRODANTIN) 100 MG CAPSULE    Take one capsule by mouth once daily for bladder   VITAMIN D, ERGOCALCIFEROL, (DRISDOL) 50000 UNITS CAPS CAPSULE    TAKE 1 CAPSULE WEEKLY FOR  VITAMIN D  Modified Medications   No medications on file  Discontinued Medications   No medications on file    Review of Systems  Constitutional: Positive for fatigue. Negative for fever.  HENT: Positive for hearing loss, rhinorrhea, sinus pressure and voice change (hoarse). Negative for trouble swallowing.   Eyes: Positive for visual disturbance (corrective lenses).  Respiratory: Positive for shortness of breath. Negative for cough, choking and chest tightness.        Hoarse  Cardiovascular: Positive for palpitations. Negative for chest pain and leg swelling.  Gastrointestinal: Negative.   Endocrine:       Diabetic  Genitourinary: Positive for dysuria and urgency.       Dribblling. Nocturia x 3. Chronic interstitial cystitis.  Musculoskeletal: Positive for arthralgias and back pain.       Right third finger trigger finger. Previously had trigger thumb on the right side that was repaired many many years ago.  Skin: Negative.   Neurological: Negative for dizziness, tremors, seizures, syncope and weakness.  Hematological: Negative.   Psychiatric/Behavioral: Positive for suicidal ideas.       Insomnia. Difficulty falling asleep and early morning awakening.    Vitals:   05/08/16 0948  BP: (!) 142/80  Pulse: (!) 57  Temp: 97.6 F (36.4 C)  TempSrc: Oral  SpO2: 96%  Weight: 171 lb (77.6 kg)  Height: '5\' 8"'  (1.727 m)   Body mass index is 26 kg/m. Filed Weights   05/08/16 0948  Weight: 171 lb (77.6 kg)     Physical Exam  Constitutional: She is oriented to person, place, and time. She appears well-nourished. No distress.  HENT:  Head: Normocephalic and atraumatic.  Right  Ear: External ear normal.  Left Ear: External ear normal.  Nose: Nose normal.  Mouth/Throat: Oropharynx is clear and moist. No oropharyngeal exudate.  Eyes: Conjunctivae and EOM are normal. Pupils are equal, round, and reactive to light.  Lens implant in both eyes.  Neck: No JVD present. No tracheal deviation present. No thyromegaly present.  Cardiovascular: Normal rate, regular rhythm, normal heart sounds and intact distal pulses.  Exam reveals no gallop and no friction rub.   No murmur heard. Pulmonary/Chest: No respiratory distress. She has no wheezes. She has no rales. She exhibits no tenderness.  Fibrosis and thickening of both breasts  Abdominal: She exhibits no distension and no mass. There is no tenderness.  Musculoskeletal: She exhibits no tenderness.  Trigger finger right third finger  Lymphadenopathy:    She has no cervical adenopathy.  Neurological: She is alert and oriented to person, place, and time. She has normal reflexes.  Reduced vibratory sensation of both feet 12/21/13 MMSE 29/30. Passed clock drawing. 04/06/16 MMSE 29/30. Passed clock drawing  Skin: No rash noted. No erythema. No pallor.  Dry  Psychiatric: She has a normal mood and affect. Her behavior is normal. Judgment and thought content normal.    Labs reviewed: Lab Summary Latest Ref Rng & Units 05/07/2016 10/30/2015 06/23/2015  Hemoglobin - (None) (None) (None)  Hematocrit - (None) (None) (None)  White count - (None) (None) (None)  Platelet count - (None) (None) (None)  Sodium 135 - 146 mmol/L 137 137 139  Potassium 3.5 - 5.3 mmol/L 4.4 4.7 4.3  Calcium 8.6 - 10.4 mg/dL 9.7 9.4 9.3  Phosphorus - (None) (None) (None)  Creatinine 0.60 - 0.88 mg/dL 1.22(H) 1.19(H) 1.13(H)  AST 10 - 35 U/L '16 15 15  ' Alk Phos 33 - 130 U/L 69 75 91  Bilirubin 0.2 - 1.2 mg/dL 0.4 0.5 0.3  Glucose 65 - 99 mg/dL 132(H) 130(H) 135(H)  Cholesterol <200 mg/dL 151 158 (None)  HDL cholesterol >50 mg/dL 42(L) 43(L) 43  Triglycerides  <150 mg/dL 218(H) 226(H) 204(H)  LDL Direct - (None) (None) (None)  LDL Calc <100 mg/dL 65 70 75  Total protein 6.1 - 8.1 g/dL  6.2 6.2 (None)  Albumin 3.6 - 5.1 g/dL 3.9 4.0 3.9  Some recent data might be hidden   Lab Results  Component Value Date   BUN 19 05/07/2016   Lab Results  Component Value Date   HGBA1C 6.3 (H) 05/07/2016    Assessment/Plan  1. Type 2 diabetes mellitus with stage 1 chronic kidney disease, without long-term current use of insulin ([facility]) The current medical regimen is effective;  continue present plan and medications. - Hemoglobin A1c; Future - Basic metabolic panel; Future - Microalbumin, urine; Future  2. Essential hypertension The current medical regimen is effective;  continue present plan and medications. - Basic metabolic panel; Future  3. Hyperlipidemia, unspecified hyperlipidemia type The current medical regimen is effective;  continue present plan and medications. - Lipid panel; Future  4. CKD (chronic kidney disease) stage 2, GFR 60-89 ml/min - Basic metabolic panel; Future  5. Trigger middle finger of right hand Go ahead with repair as planned  6. Memory loss Continue to monitor

## 2016-05-13 DIAGNOSIS — M65331 Trigger finger, right middle finger: Secondary | ICD-10-CM | POA: Diagnosis not present

## 2016-05-20 DIAGNOSIS — Z4789 Encounter for other orthopedic aftercare: Secondary | ICD-10-CM | POA: Diagnosis not present

## 2016-05-22 ENCOUNTER — Other Ambulatory Visit: Payer: Self-pay | Admitting: *Deleted

## 2016-05-22 MED ORDER — AMLODIPINE BESYLATE 2.5 MG PO TABS: ORAL_TABLET | ORAL | 3 refills | Status: DC

## 2016-05-22 NOTE — Telephone Encounter (Signed)
[facility]

## 2016-05-27 DIAGNOSIS — M65331 Trigger finger, right middle finger: Secondary | ICD-10-CM | POA: Diagnosis not present

## 2016-05-31 DIAGNOSIS — M65331 Trigger finger, right middle finger: Secondary | ICD-10-CM | POA: Diagnosis not present

## 2016-06-05 ENCOUNTER — Encounter: Payer: Self-pay | Admitting: Podiatry

## 2016-06-05 ENCOUNTER — Ambulatory Visit (INDEPENDENT_AMBULATORY_CARE_PROVIDER_SITE_OTHER): Payer: Medicare Other | Admitting: Podiatry

## 2016-06-05 DIAGNOSIS — B351 Tinea unguium: Secondary | ICD-10-CM

## 2016-06-05 DIAGNOSIS — M79676 Pain in unspecified toe(s): Secondary | ICD-10-CM | POA: Diagnosis not present

## 2016-06-05 NOTE — Patient Instructions (Signed)

## 2016-06-05 NOTE — Progress Notes (Signed)
Patient ID: Penny Miller, female   DOB: 11-25-1931, 83 y.o.   MRN: [id number]    Subjective: This patient presents again for scheduled visit complaining of uncomfortable toenails and walking wearing shoes and requests nail debridement  Objective: Orientated 3 No peripheral edema bilaterally Spider varicosities dorsal ankle feet bilaterally DP and PT pulses 2/4 bilaterally Reflex delay bilaterally Sensation to 10 g monofilament wire intact 4/5 bilaterally Vibratory sensation reactive Ankle reflex equal and reactive bilaterally No Open skin lesions bilaterally Atrophic skin bilaterally The toenails are elongated, incurvated, discolored, hypertrophic and tender to direct palpation 6-10 Manual motor testing: Dorsi flexion, plantar flexion, inversion, eversion 5/5 bilaterally  Assessment: Type II diabetic with history of peripheral neuropathy Protective sensation intact bilaterally Onychomycoses 6-10 with symptoms   Plan: Debridement toenails 6-10 mechanically and electronically without any bleeding  Reappoint 3 months

## 2016-06-12 DIAGNOSIS — M65331 Trigger finger, right middle finger: Secondary | ICD-10-CM | POA: Diagnosis not present

## 2016-06-12 DIAGNOSIS — Z4789 Encounter for other orthopedic aftercare: Secondary | ICD-10-CM | POA: Diagnosis not present

## 2016-06-25 ENCOUNTER — Other Ambulatory Visit: Payer: Self-pay | Admitting: Internal Medicine

## 2016-07-08 ENCOUNTER — Emergency Department (HOSPITAL_COMMUNITY)
Admission: EM | Admit: 2016-07-08 | Discharge: 2016-07-08 | Disposition: A | Discharge status: Home / Self Care | Payer: Medicare Other | Attending: Emergency Medicine | Admitting: Emergency Medicine

## 2016-07-08 ENCOUNTER — Emergency Department (HOSPITAL_COMMUNITY): Payer: Medicare Other

## 2016-07-08 ENCOUNTER — Encounter (HOSPITAL_COMMUNITY): Payer: Self-pay

## 2016-07-08 DIAGNOSIS — Z720 Tobacco use: Secondary | ICD-10-CM

## 2016-07-08 DIAGNOSIS — Z87891 Personal history of nicotine dependence: Secondary | ICD-10-CM | POA: Diagnosis not present

## 2016-07-08 DIAGNOSIS — I951 Orthostatic hypotension: Secondary | ICD-10-CM | POA: Diagnosis not present

## 2016-07-08 DIAGNOSIS — N182 Chronic kidney disease, stage 2 (mild): Secondary | ICD-10-CM | POA: Insufficient documentation

## 2016-07-08 DIAGNOSIS — Z7982 Long term (current) use of aspirin: Secondary | ICD-10-CM | POA: Insufficient documentation

## 2016-07-08 DIAGNOSIS — Z79899 Other long term (current) drug therapy: Secondary | ICD-10-CM | POA: Diagnosis not present

## 2016-07-08 DIAGNOSIS — E119 Type 2 diabetes mellitus without complications: Secondary | ICD-10-CM | POA: Diagnosis not present

## 2016-07-08 DIAGNOSIS — R062 Wheezing: Secondary | ICD-10-CM | POA: Insufficient documentation

## 2016-07-08 DIAGNOSIS — R404 Transient alteration of awareness: Secondary | ICD-10-CM | POA: Diagnosis not present

## 2016-07-08 DIAGNOSIS — R42 Dizziness and giddiness: Secondary | ICD-10-CM | POA: Diagnosis not present

## 2016-07-08 DIAGNOSIS — R55 Syncope and collapse: Secondary | ICD-10-CM | POA: Diagnosis present

## 2016-07-08 DIAGNOSIS — I129 Hypertensive chronic kidney disease with stage 1 through stage 4 chronic kidney disease, or unspecified chronic kidney disease: Secondary | ICD-10-CM | POA: Diagnosis not present

## 2016-07-08 DIAGNOSIS — R51 Headache: Secondary | ICD-10-CM | POA: Diagnosis not present

## 2016-07-08 DIAGNOSIS — J018 Other acute sinusitis: Secondary | ICD-10-CM | POA: Insufficient documentation

## 2016-07-08 LAB — BASIC METABOLIC PANEL
Anion gap: 8 (ref 5–15)
BUN: 15 mg/dL (ref 6–20)
CO2: 24 mmol/L (ref 22–32)
Calcium: 9.6 mg/dL (ref 8.9–10.3)
Chloride: 104 mmol/L (ref 101–111)
Creatinine, Ser: 1.03 mg/dL — ABNORMAL HIGH (ref 0.44–1.00)
GFR calc Af Amer: 57 mL/min — ABNORMAL LOW (ref >60)
GFR calc non Af Amer: 49 mL/min — ABNORMAL LOW (ref >60)
Glucose, Bld: 123 mg/dL — ABNORMAL HIGH (ref 65–99)
Potassium: 4.4 mmol/L (ref 3.5–5.1)
Sodium: 136 mmol/L (ref 135–145)

## 2016-07-08 LAB — CBC WITH DIFFERENTIAL/PLATELET
BASOS ABS: 0 10*3/uL (ref 0.0–0.1)
Basophils Relative: 0 %
Eosinophils Absolute: 0.1 10*3/uL (ref 0.0–0.7)
Eosinophils Relative: 1 %
HEMATOCRIT: 40.5 % (ref 36.0–46.0)
HEMOGLOBIN: 13.5 g/dL (ref 12.0–15.0)
Lymphocytes Relative: 14 %
Lymphs Abs: 1.7 10*3/uL (ref 0.7–4.0)
MCH: 31.8 pg (ref 26.0–34.0)
MCHC: 33.3 g/dL (ref 30.0–36.0)
MCV: 95.3 fL (ref 78.0–100.0)
MONOS PCT: 6 %
Monocytes Absolute: 0.7 10*3/uL (ref 0.1–1.0)
NEUTROS ABS: 9 10*3/uL — AB (ref 1.7–7.7)
NEUTROS PCT: 79 %
Platelets: 177 10*3/uL (ref 150–400)
RBC: 4.25 MIL/uL (ref 3.87–5.11)
RDW: 13.2 % (ref 11.5–15.5)
WBC: 11.6 10*3/uL — AB (ref 4.0–10.5)

## 2016-07-08 LAB — URINALYSIS, ROUTINE W REFLEX MICROSCOPIC
Bacteria, UA: NONE SEEN
Bilirubin Urine: NEGATIVE
Glucose, UA: 50 mg/dL — AB
Hgb urine dipstick: NEGATIVE
Ketones, ur: NEGATIVE mg/dL
Leukocytes, UA: NEGATIVE
Nitrite: NEGATIVE
Protein, ur: 30 mg/dL — AB
Specific Gravity, Urine: 1.009 (ref 1.005–1.030)
Squamous Epithelial / LPF: NONE SEEN
pH: 7 (ref 5.0–8.0)

## 2016-07-08 LAB — PROTIME-INR
INR: 1.06
PROTHROMBIN TIME: 13.8 s (ref 11.4–15.2)

## 2016-07-08 LAB — I-STAT TROPONIN, ED: Troponin i, poc: 0.01 ng/mL (ref 0.00–0.08)

## 2016-07-08 LAB — APTT: APTT: 32 s (ref 24–36)

## 2016-07-08 LAB — CBG MONITORING, ED: Glucose-Capillary: 129 mg/dL — ABNORMAL HIGH (ref 65–99)

## 2016-07-08 MED ORDER — MECLIZINE HCL 25 MG PO TABS: 25.0000 mg | ORAL_TABLET | Freq: Three times a day (TID) | ORAL | 0 refills | Status: DC | PRN

## 2016-07-08 MED ORDER — SODIUM CHLORIDE 0.9 % IV BOLUS (SEPSIS)
1000.0000 mL | Freq: Once | INTRAVENOUS | Status: AC
  Administered 2016-07-08: 1000 mL via INTRAVENOUS

## 2016-07-08 MED ORDER — ALBUTEROL SULFATE HFA 108 (90 BASE) MCG/ACT IN AERS
2.0000 | INHALATION_SPRAY | Freq: Once | RESPIRATORY_TRACT | Status: AC
  Administered 2016-07-08: 2 via RESPIRATORY_TRACT
  Filled 2016-07-08: qty 6.7

## 2016-07-08 MED ORDER — MECLIZINE HCL 25 MG PO TABS
25.0000 mg | ORAL_TABLET | Freq: Once | ORAL | Status: AC
  Administered 2016-07-08: 25 mg via ORAL
  Filled 2016-07-08: qty 1

## 2016-07-08 NOTE — ED Notes (Signed)
Patient ambulated independently to restroom.  Gait steady and even.   

## 2016-07-08 NOTE — ED Provider Notes (Signed)
[place] DEPT Provider Note   CSN: [id number] Arrival date & time: 07/08/16  1352     History   Chief Complaint Chief Complaint  Patient presents with  . Near Syncope    HPI Penny Miller is a 83 y.o. female with a PMHx of vertigo, orthostatic hypotension, mild memory loss, interstitial cystitis, IBS, HLD, DM2, polyneuropathy, diverticulosis, CKD2, palpitations, PVCs, and HTN, who presents to the ED via EMS, with complaints of a lightheaded episode that occurred around 12:45 PM ~2 hours prior to evaluation. Patient states that she had just used the restroom, was washing her hands and putting on some Chapstick in the here, when she turned her head coming out of the bathroom and suddenly felt lightheaded and "wobbly" on her feet causing her to feel like she needed to use the wall in order to walk straight. She states this felt different than her prior vertigo episodes. She states that it was more of a lightheaded sensation, denies any spinning sensation or vertiginous symptoms, states that it worsened with moving her head or trying to get up from a seated position, and seemed to improve when she sat down and rested, no other treatments were tried. She states that she was able to make it to the couch and sat down between the couch and the coffee table, resting her head on the couch cushion which seemed to help. She called 911 to help her, and by the time they got there her symptoms were improving. Associated symptoms included nausea and a mild head pressure and the posterior and top of her head that she describes as a 2/10 posterior/top of head nonradiating pressure-like sensation/pain, that was constant for a few minutes and then resolved on its own, with no known aggravating or alleviating factors given that she did not try anything specific for this symptom. She was given Zofran en route which helped with her nausea. She states that initially she had some mild blurred vision but that resolved  on its own as well. She arrives with no ongoing symptoms. She mentions that her cardiologist Dr. Percival Spanish told her once that she had orthostatic hypotension and that if she ever felt this that she needed to sit down right away. She is not on any medications for this issue. She denies any loss of consciousness, fall or head injury during the episode today. She is not on any blood thinners. Denies recent changes in her medications. Of note, no hx of wheezing/asthma, although today she reports that the EMS told her that she had some wheezing. +Smoker. No known hx of COPD, states she's never been told she has that, but she's been smoking for many years. Had a URI/cold ~2wks ago but this is improving/nearly resolved.  She denies fevers, chills, ear pain/drainage, head inj/LOC, diaphoresis, CP, SOB, cough, abd pain, V/D/C, hematochezia, melena, hematuria, dysuria, myalgias, arthralgias, numbness, tingling, focal weakness, word finding difficulties, confusion, or any other complaints at this time. Her family is at bedside and state pt is at her baseline mental state, and deny that she's been confused or altered today or recently.    The history is provided by the patient and medical records. No language interpreter was used.  Dizziness  Quality:  Lightheadedness and imbalance Severity:  Mild Onset quality:  Sudden Duration:  2 hours Timing:  Sporadic Progression:  Partially resolved Chronicity:  Recurrent Context: head movement and standing up   Relieved by:  Lying down Worsened by:  Standing up and turning head Ineffective  treatments:  None tried Associated symptoms: headaches (resolved), nausea and vision changes (blurred initially, which resolved)   Associated symptoms: no blood in stool, no chest pain, no diarrhea, no shortness of breath, no vomiting and no weakness   Risk factors: hx of vertigo   Risk factors: no new medications     Past Medical History:  Diagnosis Date  . Allergic rhinitis,  cause unspecified   . Anxiety state, unspecified   . Diaphragmatic hernia without mention of obstruction or gangrene   . Diverticulosis of colon (without mention of hemorrhage)   . Female stress incontinence   . Insomnia, unspecified   . Interstitial cystitis    surgery 02/01/2013  . Irritable bowel syndrome   . Memory loss   . Other and unspecified hyperlipidemia   . Other premature beats   . Polyneuropathy in diabetes(357.2)   . Postmenopausal bleeding   . Proteinuria   . 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 Unspecified essential hypertension   . Viral warts, unspecified     Patient Active Problem List   Diagnosis Date Noted  . CKD (chronic kidney disease) stage 2, GFR 60-89 ml/min 05/08/2016  . Palpitations 06/27/2015  . Dyspnea 06/27/2015  . Trigger finger 06/27/2015  . History of fall 02/28/2015  . DM (diabetes mellitus), type 2 with renal complications ([place]) 16/12/9602  . Sinusitis, chronic 06/22/2014  . Varicose vein of leg 12/21/2013  . Seborrheic keratosis 12/21/2013  . Deaf 06/15/2013  . Bladder infection, chronic 01/04/2013  . 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 Polyneuropathy in diabetes(357.2)   . Memory loss   . Insomnia, unspecified   . Essential hypertension   . Lipoma of unspecified site   . Hyperlipidemia   . Pain in joint, ankle and foot 11/20/2012  . Herpes zoster infection of thoracic region 08/28/2012  . Chronic interstitial cystitis     Past Surgical History:  Procedure Laterality Date  . ABDOMINAL HYSTERECTOMY  1977   Dr.Hambright  . APPENDECTOMY  1965  . CATARACT EXTRACTION, BILATERAL  02/10/2006   Dr.Groat   . COLONOSCOPY  07/17/2004   polypectomy  . [place]  . TRIGGER FINGER RELEASE  1990   Dr.Carter  . TRIGGER FINGER RELEASE  05/13/16  . urinary  bladder      surgery 02/01/2013 to stretch bladder stem    OB History    No data available       Home Medications    Prior to Admission medications   Medication Sig Start Date End Date Taking? Authorizing Provider  amLODipine (NORVASC) 2.5 MG tablet Take one tablet twice daily for blood pressure 05/22/16   Estill Dooms, MD  aspirin 81 MG tablet Take 81 mg by mouth daily.    Historical Provider, MD  atenolol (TENORMIN) 50 MG tablet TAKE 1 TABLET DAILY TO HELPBLOOD PRESSURE 02/12/16   Estill Dooms, MD  atorvastatin (LIPITOR) 20 MG tablet TAKE 1 TABLET DAILY AT     BEDTIME FOR CHOLESTEROL 06/25/16   Estill Dooms, MD  calcium carbonate (TUMS EX) 750 MG chewable tablet Chew 1 tablet by mouth daily.    Historical Provider, MD  citalopram (CELEXA) 10 MG tablet TAKE 1/2 TABLET DAILY FOR  DEPRESSION 09/11/15   Lauree Chandler, NP  Dextromethorphan-Guaifenesin [facility] DM MAXIMUM STRENGTH PO) Take by mouth.    Historical Provider,  MD  metFORMIN (GLUCOPHAGE) 500 MG tablet TAKE 1 TABLET EVERY MORNINGFOR BLOOD SUGAR 02/12/16   Estill Dooms, MD  naproxen sodium (ANAPROX) 220 MG tablet Take 220 mg by mouth at bedtime and may repeat dose one time if needed.    Historical Provider, MD  nitrofurantoin (MACRODANTIN) 100 MG capsule Take one capsule by mouth once daily for bladder 09/11/15   Estill Dooms, MD  Vitamin D, Ergocalciferol, (DRISDOL) 50000 units CAPS capsule TAKE 1 CAPSULE WEEKLY FOR  VITAMIN D 03/15/16   Estill Dooms, MD    Family History Family History  Problem Relation Age of Onset  . Cancer Mother     pancreatic  . Cancer Father     bladder  . Hypertension Sister   . Diabetes Sister   . Diabetes Brother   . Kidney cancer Brother   . Hypertension Brother   . Diabetes Brother   . Heart disease Brother 89    Myocardial Infarction     Social History Social History  Substance Use Topics  . Smoking status: Former Smoker    Quit date: 02/25/2011  . Smokeless tobacco:  Never Used  . Alcohol use No     Allergies   Ceclor [cefaclor]; Codeine; Lopid [gemfibrozil]; Metoprolol; Morphine and related; Niacin and related; Relafen [nabumetone]; Septra [sulfamethoxazole-trimethoprim]; Tetracycline; and Toprol xl [metoprolol tartrate]   Review of Systems Review of Systems  Constitutional: Negative for chills, diaphoresis and fever.  HENT: Negative for ear discharge, ear pain and facial swelling (no head injury).   Eyes: Positive for visual disturbance (blurred initially, resolved now).  Respiratory: Negative for cough and shortness of breath.   Cardiovascular: Negative for chest pain.  Gastrointestinal: Positive for nausea. Negative for abdominal pain, blood in stool, constipation, diarrhea and vomiting.  Genitourinary: Negative for dysuria and hematuria.  Musculoskeletal: Negative for arthralgias and myalgias.  Skin: Negative for color change.  Allergic/Immunologic: Positive for immunocompromised state (DM2).  Neurological: Positive for dizziness, light-headedness and headaches (resolved). Negative for syncope, speech difficulty, weakness and numbness.  Hematological: Does not bruise/bleed easily.  Psychiatric/Behavioral: Negative for confusion.   10 Systems reviewed and are negative for acute change except as noted in the HPI.   Physical Exam Updated Vital Signs BP (!) 191/88   Pulse (!) 55   Temp 97.5 F (36.4 C) (Oral)   Resp 16   Ht 5\' 8"  (1.727 m)   Wt 77.1 kg   SpO2 100%   BMI 25.85 kg/m   Physical Exam  Constitutional: She is oriented to person, place, and time. Vital signs are normal. She appears well-developed and well-nourished.  Non-toxic appearance. No distress.  Afebrile, nontoxic, NAD HTN noted but improved during exam, 150s/60s during exam  HENT:  Head: Normocephalic and atraumatic. Head is without raccoon's eyes, without Battle's sign, without abrasion and without contusion.  Right Ear: Hearing, tympanic membrane, external ear  and ear canal normal.  Left Ear: Hearing, tympanic membrane, external ear and ear canal normal.  Nose: Nose normal.  Mouth/Throat: Uvula is midline and oropharynx is clear and moist. Mucous membranes are dry. No trismus in the jaw. No uvula swelling.  Ears clear bilaterally [place]/AT, no scalp crepitus or tenderness, no abrasions or contusions, no racoon eyes or battle's sign Lips mildly dry Nose clear Oropharynx clear  Eyes: Conjunctivae and EOM are normal. Pupils are equal, round, and reactive to light. Right eye exhibits no discharge. Left eye exhibits no discharge.  PERRL, EOMI, no nystagmus +Artificial lenses noted bilaterally  Neck: Normal range of motion. Neck supple. No spinous process tenderness and no muscular tenderness present. No neck rigidity. Normal range of motion present.  FROM intact without spinous process TTP, no bony stepoffs or deformities, no paraspinous muscle TTP or muscle spasms. No rigidity or meningeal signs. No bruising or swelling.   Cardiovascular: Normal rate, regular rhythm, normal heart sounds and intact distal pulses.  Exam reveals no gallop and no friction rub.   No murmur heard. Pulmonary/Chest: Effort normal. No respiratory distress. She has no decreased breath sounds. She has wheezes. She has no rhonchi. She has no rales.  Faint expiratory wheezing noted bilaterally, otherwise CTAB in all lung fields, no rhonchi/rales, no hypoxia or increased WOB, speaking in full sentences, SpO2 100% on RA   Abdominal: Soft. Normal appearance and bowel sounds are normal. She exhibits no distension. There is no tenderness. There is no rigidity, no rebound, no guarding, no CVA tenderness, no tenderness at McBurney's point and negative Murphy's sign.  Musculoskeletal: Normal range of motion.  MAE x4 Strength and sensation grossly intact in all extremities Distal pulses intact Gait steady although once she stands and walks a few steps, she gets lightheaded, so gait exam was  stopped early  Neurological: She is alert and oriented to person, place, and time. She has normal strength. No cranial nerve deficit or sensory deficit. Coordination and gait normal. GCS eye subscore is 4. GCS verbal subscore is 5. GCS motor subscore is 6.  CN 2-12 grossly intact A&O x4 GCS 15 Sensation and strength intact Gait nonataxic but became lightheaded after standing so unable to complete tandem walking exam Coordination with finger-to-nose WNL Neg pronator drift   Skin: Skin is warm, dry and intact. No rash noted.  Psychiatric: She has a normal mood and affect.  Nursing note and vitals reviewed.    Orthostatic VS for the past 24 hrs:  BP- Lying Pulse- Lying BP- Sitting Pulse- Sitting BP- Standing at 0 minutes Pulse- Standing at 0 minutes  07/08/16 1427 182/62 54 183/78 53 159/60 56    ED Treatments / Results  Labs (all labs ordered are listed, but only abnormal results are displayed) Labs Reviewed  BASIC METABOLIC PANEL - Abnormal; Notable for the following:       Result Value   Glucose, Bld 123 (*)    Creatinine, Ser 1.03 (*)    GFR calc non Af Amer 49 (*)    GFR calc Af Amer 57 (*)    All other components within normal limits  URINALYSIS, ROUTINE W REFLEX MICROSCOPIC - Abnormal; Notable for the following:    Color, Urine STRAW (*)    Glucose, UA 50 (*)    Protein, ur 30 (*)    All other components within normal limits  CBC WITH DIFFERENTIAL/PLATELET - Abnormal; Notable for the following:    WBC 11.6 (*)    Neutro Abs 9.0 (*)    All other components within normal limits  CBG MONITORING, ED - Abnormal; Notable for the following:    Glucose-Capillary 129 (*)    All other components within normal limits  PROTIME-INR  APTT  I-STAT TROPOININ, ED    EKG  EKG Interpretation  Date/Time:  Monday July 08 2016 14:16:07 EDT Ventricular Rate:  54 PR Interval:  216 QRS Duration: 82 QT Interval:  466 QTC Calculation: 441 R Axis:   73 Text Interpretation:  Sinus  bradycardia with marked sinus arrhythmia with 1st degree A-V block Otherwise normal ECG Confirmed by [facility]  MD, Annie Main [phone]) on 07/08/2016 3:12:21 PM       Radiology Ct Head Wo Contrast  Result Date: 07/08/2016 CLINICAL DATA:  Headache and dizziness EXAM: CT HEAD WITHOUT CONTRAST TECHNIQUE: Contiguous axial images were obtained from the base of the skull through the vertex without intravenous contrast. COMPARISON:  None. FINDINGS: Brain: There is mild diffuse atrophy. There is no intracranial mass, hemorrhage, extra-axial fluid collection, or midline shift. There is patchy small vessel disease in the centra semiovale bilaterally. Elsewhere gray-white compartments appear normal. There is no evident acute infarct. Vascular: There is no appreciable hyperdense vessel. There is calcification in both carotid siphon regions. There is calcification in each distal vertebral artery. Skull: Bony calvarium appears intact. Sinuses/Orbits: There is an air-fluid level in the right sphenoid sinus. There is mild opacification in several ethmoid air cells bilaterally. There is a calcified benign osteoma measuring 8 x 8 mm in the inferior right frontal sinus. Visualized orbits appear symmetric bilaterally. Other: Mastoid air cells are clear. IMPRESSION: Atrophy with patchy periventricular small vessel disease. No intracranial mass, hemorrhage, or extra-axial fluid collection. No acute infarct evident. There is an air-fluid level in the right sphenoid sinus region. There is mucosal thickening in several ethmoid air cells. There is a benign small frontal osteoma on the right. There are foci of arterial vascular calcification. Electronically Signed   By: Lowella Grip III M.D.   On: 07/08/2016 15:28    Procedures Procedures (including critical care time)  Medications Ordered in ED Medications  sodium chloride 0.9 % bolus 1,000 mL (1,000 mLs Intravenous New Bag/Given 07/08/16 1537)  albuterol (PROVENTIL HFA;VENTOLIN  HFA) 108 (90 Base) MCG/ACT inhaler 2 puff (2 puffs Inhalation Given 07/08/16 1537)  meclizine (ANTIVERT) tablet 25 mg (25 mg Oral Given 07/08/16 1536)     Initial Impression / Assessment and Plan / ED Course  I have reviewed the triage vital signs and the nursing notes.  Pertinent labs & imaging results that were available during my care of the patient were reviewed by me and considered in my medical decision making (see chart for details).     83 y.o. female here with near syncopal episode earlier today, states she turned her head after coming out of the bathroom and got lightheaded, felt wobbly on her feet, nauseated, and had a mild headache that she states just felt like pressure on the top of her head. Lasted a few minutes and gradually her symptoms improved. Lightheadedness gets triggered by standing or turning head. +Orthostatic VS upon arrival. On exam, when she stands she feels lightheaded again, although she was able to ambulate with steady gait, but couldn't complete tandem walking due to feeling lightheaded. No other focal neuro deficits on exam. Lungs with mild faint expiratory wheezing noted, which pt states is new. U/A without evidence of infection, EKG with sinus bradycardia and 1st degree AV block vs solitary PVC?, similar to prior EKGs, and otherwise with no acute ischemic findings. CBG 129. Pt given zofran en route and feels better. Will get CBC w/diff, BMP, trop, INR, APTT, CT head, and give fluids, albuterol inhaler, and meclizine, then reassess shortly. Discussed case with my attending Dr. Wilson Singer who agrees with plan.   4:33 PM CBC w/diff with mildly elevated WBC 11.6, differential fairly unremarkable; could be mild stress demargination. BMP with stable kidney function Cr 1.03 which is essentially at baseline; gluc 123, otherwise unremarkable. Trop neg. INR/APTT WNL. CT head with atrophy and patchy periventricular small vessel disease but no intracranial  mass, hemorrhage, or fluid  collection; no acute infarct seen. Mild air-fluid level in sphenoid sinus region and mucosal thickening in ethmoid air cells, which would correlate to her recent URI, which is improving, doubt need for abx at this time. Pt feeling much better after meds, fluids ~40% done so will let these finish. Lung sounds greatly improved after inhaler. Symptoms overall likely from orthostatic hypotension combined with recent URI; pt feeling improved, tolerating PO well, ambulatory with steady gait. Will allow fluids to finish, and then likely send home with meclizine, inhaler to take home, and advised pt to stay well hydrated with plenty of fluids. OTC remedies for sinus congestion advised. Smoking cessation advised. F/up with PCP and cardiologist in 5-7 days for recheck of symptoms. Will let fluids finish then likely d/c as long as she remains improved.  5:35 PM Fluids finished. Pt continues to feel improved. Will send home with previously outlined plan. I explained the diagnosis and have given explicit precautions to return to the ER including for any other new or worsening symptoms. The patient understands and accepts the medical plan as it's been dictated and I have answered their questions. Discharge instructions concerning home care and prescriptions have been given. The patient is STABLE and is discharged to home in good condition.    Final Clinical Impressions(s) / ED Diagnoses   Final diagnoses:  Near syncope  Orthostatic hypotension  Tobacco user  Wheezing  Other subacute sinusitis    New Prescriptions New Prescriptions   MECLIZINE (ANTIVERT) 25 MG TABLET    Take 1 tablet (25 mg total) by mouth 3 (three) times daily as needed for dizziness or nausea.     [address], PA-C 07/08/16 [place], MD 07/16/16 [phone]

## 2016-07-08 NOTE — ED Triage Notes (Signed)
From home, alone. Came out of bathroom and felt dizzy. hy of vertigo but doesn't take meds for it. Patient put herself on the couch and felt nauseous with a headache. Did not fall or hit head and no LOC. 4 zofran and able to ambulate to the bathroom upon arrival

## 2016-07-08 NOTE — ED Notes (Signed)
Papers reviewed with patient and family by Neta Mends student with myself present to answer any further questions. They deny any more questions. IV removed and leaving with family ambulatory today.

## 2016-07-08 NOTE — Discharge Instructions (Signed)
Continue to stay well-hydrated. Use meclizine as directed as needed for nausea or dizziness. Continue to alternate between Tylenol and Ibuprofen for pain or fever. Use Mucinex for cough suppression/expectoration of mucus. Use netipot and flonase to help with nasal congestion. May consider over-the-counter Benadryl or other antihistamine to decrease secretions and for help with your symptoms. Use inhaler as directed, as needed for cough/chest congestion/wheezing/shortness of breath. STOP SMOKING! Follow up with your primary care doctor and your cardiologist in 5-7 days for recheck of ongoing symptoms and ongoing management of your symptoms. Return to emergency department for emergent changing or worsening of symptoms.

## 2016-07-28 ENCOUNTER — Encounter (HOSPITAL_COMMUNITY): Payer: Self-pay

## 2016-07-28 ENCOUNTER — Emergency Department (HOSPITAL_COMMUNITY): Payer: Medicare Other

## 2016-07-28 ENCOUNTER — Inpatient Hospital Stay (HOSPITAL_COMMUNITY)
Admission: EM | Admit: 2016-07-28 | Discharge: 2016-07-31 | DRG: 041 | Disposition: A | Discharge status: Home / Self Care | Payer: Medicare Other | Attending: Internal Medicine | Admitting: Internal Medicine

## 2016-07-28 DIAGNOSIS — Z7984 Long term (current) use of oral hypoglycemic drugs: Secondary | ICD-10-CM

## 2016-07-28 DIAGNOSIS — I10 Essential (primary) hypertension: Secondary | ICD-10-CM | POA: Diagnosis not present

## 2016-07-28 DIAGNOSIS — E1142 Type 2 diabetes mellitus with diabetic polyneuropathy: Secondary | ICD-10-CM | POA: Diagnosis present

## 2016-07-28 DIAGNOSIS — E1122 Type 2 diabetes mellitus with diabetic chronic kidney disease: Secondary | ICD-10-CM | POA: Diagnosis not present

## 2016-07-28 DIAGNOSIS — F17210 Nicotine dependence, cigarettes, uncomplicated: Secondary | ICD-10-CM | POA: Diagnosis present

## 2016-07-28 DIAGNOSIS — F0390 Unspecified dementia without behavioral disturbance: Secondary | ICD-10-CM | POA: Diagnosis present

## 2016-07-28 DIAGNOSIS — I6340 Cerebral infarction due to embolism of unspecified cerebral artery: Secondary | ICD-10-CM | POA: Diagnosis not present

## 2016-07-28 DIAGNOSIS — I639 Cerebral infarction, unspecified: Secondary | ICD-10-CM

## 2016-07-28 DIAGNOSIS — E7800 Pure hypercholesterolemia, unspecified: Secondary | ICD-10-CM | POA: Diagnosis present

## 2016-07-28 DIAGNOSIS — Z881 Allergy status to other antibiotic agents status: Secondary | ICD-10-CM

## 2016-07-28 DIAGNOSIS — Z8249 Family history of ischemic heart disease and other diseases of the circulatory system: Secondary | ICD-10-CM

## 2016-07-28 DIAGNOSIS — Z833 Family history of diabetes mellitus: Secondary | ICD-10-CM

## 2016-07-28 DIAGNOSIS — Z8673 Personal history of transient ischemic attack (TIA), and cerebral infarction without residual deficits: Secondary | ICD-10-CM | POA: Diagnosis present

## 2016-07-28 DIAGNOSIS — N182 Chronic kidney disease, stage 2 (mild): Secondary | ICD-10-CM | POA: Diagnosis present

## 2016-07-28 DIAGNOSIS — I63442 Cerebral infarction due to embolism of left cerebellar artery: Secondary | ICD-10-CM | POA: Diagnosis present

## 2016-07-28 DIAGNOSIS — E1151 Type 2 diabetes mellitus with diabetic peripheral angiopathy without gangrene: Secondary | ICD-10-CM | POA: Diagnosis not present

## 2016-07-28 DIAGNOSIS — N181 Chronic kidney disease, stage 1: Secondary | ICD-10-CM

## 2016-07-28 DIAGNOSIS — E871 Hypo-osmolality and hyponatremia: Secondary | ICD-10-CM | POA: Diagnosis present

## 2016-07-28 DIAGNOSIS — I6300 Cerebral infarction due to thrombosis of unspecified precerebral artery: Secondary | ICD-10-CM

## 2016-07-28 DIAGNOSIS — N3010 Interstitial cystitis (chronic) without hematuria: Secondary | ICD-10-CM | POA: Diagnosis present

## 2016-07-28 DIAGNOSIS — Z90710 Acquired absence of both cervix and uterus: Secondary | ICD-10-CM

## 2016-07-28 DIAGNOSIS — R531 Weakness: Secondary | ICD-10-CM | POA: Diagnosis not present

## 2016-07-28 DIAGNOSIS — R270 Ataxia, unspecified: Secondary | ICD-10-CM | POA: Diagnosis present

## 2016-07-28 DIAGNOSIS — F329 Major depressive disorder, single episode, unspecified: Secondary | ICD-10-CM | POA: Diagnosis present

## 2016-07-28 DIAGNOSIS — Z7982 Long term (current) use of aspirin: Secondary | ICD-10-CM

## 2016-07-28 DIAGNOSIS — R404 Transient alteration of awareness: Secondary | ICD-10-CM | POA: Diagnosis not present

## 2016-07-28 DIAGNOSIS — R42 Dizziness and giddiness: Secondary | ICD-10-CM | POA: Diagnosis not present

## 2016-07-28 DIAGNOSIS — Z79899 Other long term (current) drug therapy: Secondary | ICD-10-CM

## 2016-07-28 DIAGNOSIS — Z885 Allergy status to narcotic agent status: Secondary | ICD-10-CM

## 2016-07-28 DIAGNOSIS — Z6829 Body mass index (BMI) 29.0-29.9, adult: Secondary | ICD-10-CM

## 2016-07-28 DIAGNOSIS — E878 Other disorders of electrolyte and fluid balance, not elsewhere classified: Secondary | ICD-10-CM | POA: Diagnosis present

## 2016-07-28 DIAGNOSIS — E1129 Type 2 diabetes mellitus with other diabetic kidney complication: Secondary | ICD-10-CM | POA: Diagnosis present

## 2016-07-28 DIAGNOSIS — Z888 Allergy status to other drugs, medicaments and biological substances status: Secondary | ICD-10-CM

## 2016-07-28 DIAGNOSIS — E663 Overweight: Secondary | ICD-10-CM | POA: Diagnosis present

## 2016-07-28 DIAGNOSIS — E785 Hyperlipidemia, unspecified: Secondary | ICD-10-CM | POA: Diagnosis not present

## 2016-07-28 DIAGNOSIS — I726 Aneurysm of vertebral artery: Secondary | ICD-10-CM | POA: Diagnosis present

## 2016-07-28 DIAGNOSIS — F411 Generalized anxiety disorder: Secondary | ICD-10-CM | POA: Diagnosis present

## 2016-07-28 DIAGNOSIS — G459 Transient cerebral ischemic attack, unspecified: Secondary | ICD-10-CM

## 2016-07-28 DIAGNOSIS — H9190 Unspecified hearing loss, unspecified ear: Secondary | ICD-10-CM | POA: Diagnosis present

## 2016-07-28 DIAGNOSIS — R001 Bradycardia, unspecified: Secondary | ICD-10-CM | POA: Diagnosis not present

## 2016-07-28 LAB — BASIC METABOLIC PANEL
ANION GAP: 9 (ref 5–15)
BUN: 15 mg/dL (ref 6–20)
CALCIUM: 9.4 mg/dL (ref 8.9–10.3)
CO2: 24 mmol/L (ref 22–32)
Chloride: 100 mmol/L — ABNORMAL LOW (ref 101–111)
Creatinine, Ser: 1.01 mg/dL — ABNORMAL HIGH (ref 0.44–1.00)
GFR, EST AFRICAN AMERICAN: 58 mL/min — AB (ref >60)
GFR, EST NON AFRICAN AMERICAN: 50 mL/min — AB (ref >60)
GLUCOSE: 140 mg/dL — AB (ref 65–99)
Potassium: 4 mmol/L (ref 3.5–5.1)
SODIUM: 133 mmol/L — AB (ref 135–145)

## 2016-07-28 LAB — I-STAT TROPONIN, ED: TROPONIN I, POC: 0.01 ng/mL (ref 0.00–0.08)

## 2016-07-28 LAB — CBC WITH DIFFERENTIAL/PLATELET
BASOS ABS: 0 10*3/uL (ref 0.0–0.1)
BASOS PCT: 0 %
EOS ABS: 0.1 10*3/uL (ref 0.0–0.7)
EOS PCT: 1 %
HCT: 38.8 % (ref 36.0–46.0)
Hemoglobin: 13 g/dL (ref 12.0–15.0)
LYMPHS PCT: 20 %
Lymphs Abs: 1.8 10*3/uL (ref 0.7–4.0)
MCH: 31.4 pg (ref 26.0–34.0)
MCHC: 33.5 g/dL (ref 30.0–36.0)
MCV: 93.7 fL (ref 78.0–100.0)
Monocytes Absolute: 0.7 10*3/uL (ref 0.1–1.0)
Monocytes Relative: 7 %
Neutro Abs: 6.3 10*3/uL (ref 1.7–7.7)
Neutrophils Relative %: 72 %
PLATELETS: 166 10*3/uL (ref 150–400)
RBC: 4.14 MIL/uL (ref 3.87–5.11)
RDW: 12.9 % (ref 11.5–15.5)
WBC: 8.8 10*3/uL (ref 4.0–10.5)

## 2016-07-28 LAB — URINALYSIS, ROUTINE W REFLEX MICROSCOPIC
BILIRUBIN URINE: NEGATIVE
Glucose, UA: 50 mg/dL — AB
Ketones, ur: NEGATIVE mg/dL
Leukocytes, UA: NEGATIVE
NITRITE: NEGATIVE
PH: 7 (ref 5.0–8.0)
Protein, ur: NEGATIVE mg/dL
SPECIFIC GRAVITY, URINE: 1.004 — AB (ref 1.005–1.030)

## 2016-07-28 MED ORDER — GADOBENATE DIMEGLUMINE 529 MG/ML IV SOLN
15.0000 mL | Freq: Once | INTRAVENOUS | Status: AC | PRN
  Administered 2016-07-28: 15 mL via INTRAVENOUS

## 2016-07-28 NOTE — ED Notes (Signed)
ED Provider at bedside. 

## 2016-07-28 NOTE — H&P (Signed)
History and Physical    Penny Miller XVQ:[id number] DOB: 09/07/1931 DOA: 07/28/2016  PCP: Jeanmarie Hubert, MD - Will see Hollace Kinnier when he retires in June Consultants:  Rosana Hoes  Urology; Delman Cheadle -ophthalmology; Hochrein - cardiology Patient coming from: home - lives with son; [place]: son  Chief Complaint: left-sided weakness, dizziness  HPI: Penny Miller is a 83 y.o. female with medical history significant of HTN, HLD, DM, mild dementia, and interstitial cystitis presenting with a CVA.  About 2 weeks ago, she was here for vertigo.  Similar symptoms to today - very dizzy, nauseated, difficulty with balance, difficulty with walking.  Had a cardiologist who was concerned for orthostatic hypotension in the past.  She thought that was what it was.  CT was negative.  Today's symptoms started about 7:30am.  She went from the bedroom to kitchen and felt a spell coming on.  All of the aforementioned symptoms as well as difficulty focusing her eyes.  She grabbed hold of a pole because she didn't think she could stand unsupported.  She told her son she didn't feel right.  By that time, she couldn't pick up her leg foot.  Had to slide the foot to move it.  Very nauseated when she sat in the chair.  Left arm felt "lightweight or heavy or something, like it would just twist in the wind, like I didn't have no control over it."    Symptoms waxed and waned by about 11:30.  Her son and granddaughter finally decided she needed to be seen.  Speech was slurred according to her son, but she didn't notice that much.  She doesn't remember having trouble swallowing.  Symptoms have continued to improve while in the ER.  She is still a bit dizzy and nauseated, but much better.   ED Course: CT with cerebellar edema c/w infarct.  Dr. Cheral Marker consulted.  Review of Systems: As per HPI; otherwise review of systems reviewed and negative.   Ambulatory Status:  Ambulated independently prior to this - Bowling, mowing the yard  Past  Medical History:  Diagnosis Date  . Allergic rhinitis, cause unspecified   . Anxiety state, unspecified   . Diaphragmatic hernia without mention of obstruction or gangrene   . Diverticulosis of colon (without mention of hemorrhage)   . Female stress incontinence   . Insomnia, unspecified   . Interstitial cystitis    surgery 02/01/2013  . Irritable bowel syndrome   . Memory loss   . Other and unspecified hyperlipidemia   . Other premature beats   . Polyneuropathy in diabetes(357.2)   . Postmenopausal bleeding   . Proteinuria   . 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 Unspecified essential hypertension   . Viral warts, unspecified     Past Surgical History:  Procedure Laterality Date  . ABDOMINAL HYSTERECTOMY  1977   Dr.Hambright  . APPENDECTOMY  1965  . CATARACT EXTRACTION, BILATERAL  02/10/2006   Dr.Groat   . COLONOSCOPY  07/17/2004   polypectomy  . [place]  . TRIGGER FINGER RELEASE  1990   Dr.Carter  . TRIGGER FINGER RELEASE  05/13/16  . urinary bladder      surgery 02/01/2013 to stretch bladder stem    Social History   Social History  . Marital status: Widowed    Spouse name: N/A  . Number of children: 1  . Years of education: N/A   Occupational History  .  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 Smoking status: Current Every Day Smoker    Years: 62.00  . Smokeless tobacco: Never Used     Comment: stopped consistently smoking in 2012 but still smokes occasionally  . Alcohol use No  . Drug use: No  . Sexual activity: No   Other Topics Concern  . Not on file   Social History Narrative   Ophthalmologist-Dr.Gould and Dr.Groat   Podiatrist- Dr.Tuchman   Dermatologist- Dr.Lupton   Urologist- Dr. Lawerance Bach    Lives with her son.        Allergies  Allergen Reactions  . Ceclor [Cefaclor] Other (See Comments)    Unknown reaction  .  Codeine Nausea And Vomiting  . Lopid [Gemfibrozil] Other (See Comments)    Unknown reaction  . Metoprolol Nausea And Vomiting  . Morphine And Related Other (See Comments)    Passed out  . Niacin And Related Other (See Comments)    Unknown reaction  . Relafen [Nabumetone] Nausea And Vomiting  . Septra [Sulfamethoxazole-Trimethoprim] Nausea And Vomiting  . Tetracycline Nausea And Vomiting  . Toprol Xl [Metoprolol Tartrate] Nausea And Vomiting    Family History  Problem Relation Age of Onset  . Cancer Mother     pancreatic  . Cancer Father     bladder  . Hypertension Sister   . Diabetes Sister   . Diabetes Brother   . Kidney cancer Brother   . Hypertension Brother   . Diabetes Brother   . Heart disease Brother 78    Myocardial Infarction     Prior to Admission medications   Medication Sig Start Date End Date Taking? Authorizing Provider  albuterol (PROVENTIL HFA;VENTOLIN HFA) 108 (90 Base) MCG/ACT inhaler Inhale 2 puffs into the lungs every 6 (six) hours as needed for wheezing or shortness of breath.   Yes Historical Provider, MD  amLODipine (NORVASC) 2.5 MG tablet Take one tablet twice daily for blood pressure Patient taking differently: Take 2.5 mg by mouth 2 (two) times daily. for blood pressure 05/22/16  Yes Estill Dooms, MD  aspirin EC 81 MG tablet Take 81 mg by mouth daily.   Yes Historical Provider, MD  atenolol (TENORMIN) 50 MG tablet TAKE 1 TABLET DAILY TO HELPBLOOD PRESSURE Patient taking differently: TAKE 1 TABLET DAILY TO HELP BLOOD PRESSURE 02/12/16  Yes Estill Dooms, MD  atorvastatin (LIPITOR) 20 MG tablet TAKE 1 TABLET DAILY AT     BEDTIME FOR CHOLESTEROL 06/25/16  Yes Estill Dooms, MD  calcium carbonate (TUMS EX) 750 MG chewable tablet Chew 1 tablet by mouth daily as needed for heartburn.    Yes Historical Provider, MD  citalopram (CELEXA) 10 MG tablet TAKE 1/2 TABLET DAILY FOR  DEPRESSION 09/11/15  Yes Lauree Chandler, NP  Dextromethorphan-Guaifenesin  (MUCINEX DM MAXIMUM STRENGTH PO) Take 30 mLs by mouth daily as needed (congestion).    Yes Historical Provider, MD  meclizine (ANTIVERT) 25 MG tablet Take 1 tablet (25 mg total) by mouth 3 (three) times daily as needed for dizziness or nausea. Patient taking differently: Take 12.5-25 mg by mouth 3 (three) times daily as needed for dizziness or nausea.  07/08/16  Yes [place], PA-C  metFORMIN (GLUCOPHAGE) 500 MG tablet TAKE 1 TABLET EVERY MORNINGFOR BLOOD SUGAR Patient taking differently: TAKE 1 TABLET EVERY MORNING FOR BLOOD SUGAR 02/12/16  Yes Estill Dooms, MD  naproxen sodium (ALEVE) 220 MG tablet Take 220 mg  by mouth 2 (two) times daily with a meal.   Yes Historical Provider, MD  nitrofurantoin (MACRODANTIN) 100 MG capsule Take one capsule by mouth once daily for bladder Patient taking differently: Take 100 mg by mouth at bedtime as needed (urinary burning/pressure).  09/11/15  Yes Estill Dooms, MD  Vitamin D, Ergocalciferol, (DRISDOL) 50000 units CAPS capsule TAKE 1 CAPSULE WEEKLY FOR  VITAMIN D Patient taking differently: TAKE 1 CAPSULE BY MOUTH EVERY SATURDAY FOR  VITAMIN D 03/15/16  Yes Estill Dooms, MD    Physical Exam: Vitals:   07/28/16 2200 07/28/16 2313 07/28/16 2340 07/29/16 0000  BP: (!) 166/73 (!) 189/79 (!) 151/59 (!) 163/81  Pulse: (!) 31 (!) 51 (!) 47 (!) 50  Resp:   13 16  SpO2: 92% 94% 96% 95%     General: Appears calm and comfortable and is NAD Eyes:  PERRL, EOMI, normal lids, iris ENT:  grossly normal hearing, lips & tongue, mmm Neck:  no LAD, masses or thyromegaly.  No appreciated carotid bruits (I did not hear significant flow on the left but no bruit). Cardiovascular:  RRR, no m/r/g. No LE edema.  Respiratory:  CTA bilaterally, no w/r/r. Normal respiratory effort. Abdomen:  soft, ntnd, NABS Skin:  no rash or induration seen on limited exam Musculoskeletal: grossly normal tone BUE/BLE, good ROM, no bony abnormality.  Minimal, if any, left-sided residual  weakness Psychiatric:  grossly normal mood and affect, speech fluent and appropriate, AOx3 Neurologic:  CN 2-12 grossly intact, moves all extremities in coordinated fashion, sensation intact  Labs on Admission: I have personally reviewed following labs and imaging studies  CBC:  Recent Labs Lab 07/28/16 1917  WBC 8.8  NEUTROABS 6.3  HGB 13.0  HCT 38.8  MCV 93.7  PLT 116   Basic Metabolic Panel:  Recent Labs Lab 07/28/16 1917  NA 133*  K 4.0  CL 100*  CO2 24  GLUCOSE 140*  BUN 15  CREATININE 1.01*  CALCIUM 9.4   GFR: CrCl cannot be calculated (Unknown ideal weight.). Liver Function Tests: No results for input(s): AST, ALT, ALKPHOS, BILITOT, PROT, ALBUMIN in the last 168 hours. No results for input(s): LIPASE, AMYLASE in the last 168 hours. No results for input(s): AMMONIA in the last 168 hours. Coagulation Profile: No results for input(s): INR, PROTIME in the last 168 hours. Cardiac Enzymes: No results for input(s): CKTOTAL, CKMB, CKMBINDEX, TROPONINI in the last 168 hours. BNP (last 3 results) No results for input(s): PROBNP in the last 8760 hours. HbA1C: No results for input(s): HGBA1C in the last 72 hours. CBG: No results for input(s): GLUCAP in the last 168 hours. Lipid Profile: No results for input(s): CHOL, HDL, LDLCALC, TRIG, CHOLHDL, LDLDIRECT in the last 72 hours. Thyroid Function Tests: No results for input(s): TSH, T4TOTAL, FREET4, T3FREE, THYROIDAB in the last 72 hours. Anemia Panel: No results for input(s): VITAMINB12, FOLATE, FERRITIN, TIBC, IRON, RETICCTPCT in the last 72 hours. Urine analysis:    Component Value Date/Time   COLORURINE COLORLESS (A) 07/28/2016 1915   APPEARANCEUR CLEAR 07/28/2016 1915   APPEARANCEUR Cloudy (A) 12/17/2013 0906   LABSPEC 1.004 (L) 07/28/2016 1915   PHURINE 7.0 07/28/2016 1915   GLUCOSEU 50 (A) 07/28/2016 1915   HGBUR SMALL (A) 07/28/2016 1915   BILIRUBINUR NEGATIVE 07/28/2016 1915   BILIRUBINUR Negative  12/17/2013 [place] 07/28/2016 [place] NEGATIVE 07/28/2016 1915   NITRITE NEGATIVE 07/28/2016 [place] NEGATIVE 07/28/2016 1915   LEUKOCYTESUR  Negative 12/17/2013 0906    Creatinine Clearance: CrCl cannot be calculated (Unknown ideal weight.).  Sepsis Labs: @LABRCNTIP (procalcitonin:4,lacticidven:4) )No results found for this or any previous visit (from the past 240 hour(s)).   Radiological Exams on Admission: Ct Head Wo Contrast  Result Date: 07/28/2016 CLINICAL DATA:  Left-sided weakness and dizziness left leg weakness EXAM: CT HEAD WITHOUT CONTRAST TECHNIQUE: Contiguous axial images were obtained from the base of the skull through the vertex without intravenous contrast. COMPARISON:  07/08/2016 FINDINGS: Brain: Focal low attenuation within the left anterior, superior cerebellum, suspicious for a focal area of edema. No hemorrhage or mass. Old lacune in the right basal ganglia. Moderate periventricular, subcortical and deep white matter small vessel ischemic changes. The ventricles are stable in size. Mild cortical atrophy. Vascular: No hyperdense vessels. Carotid artery calcifications. Mild vertebral artery calcifications. Skull: No fracture or suspicious bone lesion Sinuses/Orbits: Osteoma right frontal sinus. Mild mucosal thickening in the ethmoid sinuses. Fluid level in the right sphenoid sinus. No acute orbital abnormality. Other: None IMPRESSION: 1. Focal hypodensity in the left anterior, superior cerebellum. This is suspicious for a focal area of edema/ischemia. MRI recommended for further evaluation. No hemorrhage. 2. Atrophy and chronic white matter small vessel ischemic changes 3. Fluid level in the sphenoid sinus with mucosal disease in the ethmoid sinuses. Electronically Signed   By: Donavan Foil M.D.   On: 07/28/2016 21:10   Mr Brain W And Wo Contrast  Result Date: 07/28/2016 CLINICAL DATA:  Vertigo, follow-up abnormal CT. Woke up today with  dizziness and LEFT-sided weakness. History of diabetes, hypertension, memory loss. EXAM: MRI HEAD WITHOUT AND WITH CONTRAST TECHNIQUE: Multiplanar, multiecho pulse sequences of the brain and surrounding structures were obtained without and with intravenous contrast. CONTRAST:  67mL MULTIHANCE GADOBENATE DIMEGLUMINE 529 MG/ML IV SOLN COMPARISON:  CT HEAD July 28, 2016 at 2007 hours FINDINGS: Postcontrast sequences are mildly motion degraded. INTRACRANIAL CONTENTS: 21 x 18 mm area reduced diffusion LEFT superior cerebellum with low ADC values within superior cerebellar artery territory. No susceptibility artifact to suggest acute hemorrhage ; a few punctate chronic micro hemorrhages associated with hypertension. The ventricles and sulci are normal for patient's age. No suspicious parenchymal signal, masses, mass effect. Patchy to confluent supratentorial and pontine white matter FLAIR T2 hyperintensities. 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 No abnormal intraparenchymal or extra-axial enhancement. No abnormal extra-axial fluid collections. No extra-axial masses. VASCULAR: Normal major intracranial vascular flow voids present at skull base. SKULL AND UPPER CERVICAL SPINE: No abnormal sellar expansion. No suspicious calvarial bone marrow signal. Craniocervical junction maintained. Grade 1 C3-4 anterolisthesis. SINUSES/ORBITS: Small RIGHT sphenoid sinus air-fluid level. Mastoid air cells are well aerated. The included ocular globes and orbital contents are non-suspicious. Status post bilateral ocular lens implants. OTHER: None. IMPRESSION: Acute LEFT cerebellar/SCA territory nonhemorrhagic infarct corresponding to CT abnormality. Moderate chronic small vessel ischemic disease and old bilateral small cerebellar infarcts. Electronically Signed   By: Elon Alas M.D.   On: 07/28/2016 23:24    EKG: Independently reviewed.  NSR with rate 52;  no evidence of acute ischemia  Assessment/Plan Principal Problem:    CVA (cerebral vascular accident) ([place]) Active Problems:   Essential hypertension   Hyperlipidemia   DM (diabetes mellitus), type 2 with renal complications ([place])   CVA -Patient present with classic symptoms of CVA - acute onset of unilateral weakness of the left arm and leg with slurring of speech -She had previously been evaluated for similar symptoms on 4/9 and was diagnosed with vertigo so  was reluctant to come in for evaluation -Presented outside the tPA window -CT and MRI confirm acute cerebellar stroke -Will admit for further CVA evaluation and treatment -Telemetry monitoring -Carotid dopplers were done in 9/17 with minimal disease; will not repeat at this time. -Echo -Risk stratification with FLP, A1c not needed due to recent testing (see below)  -Will check TSH  -Patient has been regularly taking ASA daily and so has failed ASA for primary prevention.  Will stop ASA and prescribe Plavix at this time (although neurology may request both for some time). -PT/OT/ST/Nutrition Consults - although her symptoms have mostly resolved by now and so she may not require further treatment  HTN -Allow permissive HTN -Treat BP only if >220/120, and then with goal of 15% reduction -Hold CCB and BB and plan to restart in 48-72 hours  HLD -Lipids checked 05/07/16 - Total 151, HDL 42, LDL 65, TG 218 -She has also been taking Lipitor 20 mg  -Despite this, she still had a CVA -Will increase Lipitor to 40 mg qhs  DM -Glucose 140, A1c 6.3 in 2/18 -Hold Metformin -Cover with SSI   DVT prophylaxis: Lovenox Code Status:  Full - confirmed with patient Family Communication: None present Disposition Plan:  Home once clinically improved Consults called: Neurology  Admission status: Admit - It is my clinical opinion that admission to [place] [facility] is reasonable and necessary because this patient will require at least 2 midnights in the hospital to treat this condition based on the medical complexity of  the problems presented.  Given the aforementioned information, the predictability of an adverse outcome is felt to be significant.    Karmen Bongo MD [facility]  If 7PM-7AM, please contact night-coverage [email or web address] Password [email or web address]  07/29/2016, 12:28 AM

## 2016-07-28 NOTE — ED Triage Notes (Signed)
Per EMS: Pt woke this morning with L sided weakness and dizziness. Per EMS, upon arrival L sided weakness resolved, pt continues to have L leg weakness and dizziness. Pt a/o x 4 upon arrival.

## 2016-07-28 NOTE — ED Notes (Addendum)
Pt placed on bedpan by this nurse. Unable to void, will attempt again when pt returns from CT. 2 chux pads placed under patient in case of an accident.

## 2016-07-28 NOTE — Consult Note (Signed)
Referring Physician: Dr. Jeneen Rinks    Chief Complaint: Left sided weakness and dizziness  HPI: Penny Miller is an 83 y.o. female who presented with severe vertigo, nausea and dry heaves. The episode occurred on Sunday on awakening. She also was unsteady on her feet, falling to her left side, in conjunction with left leg and arm weakness. She did not have associated confusion but her speech was slurred.   She had an episode 2 weeks ago consisting of transient severe vertigo.   On arrival to the ED, she still had some left sided weakness. CT head revealed focal hypodensity in the left anterior, superior cerebellum, appearing most consistent with an ischemic stroke.   MRI brain confirmed the above finding as a subacute anterior left cerebellar SCA territory nonhemorrhagic infarction. Also noted on MRI was moderate chronic small vessel ischemic disease and old bilateral small cerebellar infarcts.  Home medications include ASA and atorvastatin.   Past Medical History:  Diagnosis Date  . Allergic rhinitis, cause unspecified   . Anxiety state, unspecified   . Diaphragmatic hernia without mention of obstruction or gangrene   . Diverticulosis of colon (without mention of hemorrhage)   . Female stress incontinence   . Insomnia, unspecified   . Interstitial cystitis    surgery 02/01/2013  . Irritable bowel syndrome   . Memory loss   . Other and unspecified hyperlipidemia   . Other premature beats   . Polyneuropathy in diabetes(357.2)   . Postmenopausal bleeding   . Proteinuria   . 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 Unspecified essential hypertension   . Viral warts, unspecified     Past Surgical History:  Procedure Laterality Date  . ABDOMINAL HYSTERECTOMY  1977   Dr.Hambright  . APPENDECTOMY  1965  . CATARACT EXTRACTION, BILATERAL  02/10/2006   Dr.Groat   . COLONOSCOPY  07/17/2004   polypectomy  .  [place]  . TRIGGER FINGER RELEASE  1990   Dr.Carter  . TRIGGER FINGER RELEASE  05/13/16  . urinary bladder      surgery 02/01/2013 to stretch bladder stem    Family History  Problem Relation Age of Onset  . Cancer Mother     pancreatic  . Cancer Father     bladder  . Hypertension Sister   . Diabetes Sister   . Diabetes Brother   . Kidney cancer Brother   . Hypertension Brother   . Diabetes Brother   . Heart disease Brother 56    Myocardial Infarction    Social History:  reports that she quit smoking about 5 years ago. She has never used smokeless tobacco. She reports that she does not drink alcohol or use drugs.  Allergies:  Allergies  Allergen Reactions  . Ceclor [Cefaclor] Other (See Comments)    Unknown reaction  . Codeine Nausea And Vomiting  . Lopid [Gemfibrozil] Other (See Comments)    Unknown reaction  . Metoprolol Nausea And Vomiting  . Morphine And Related Other (See Comments)    Passed out  . Niacin And Related Other (See Comments)    Unknown reaction  . Relafen [Nabumetone] Nausea And Vomiting  . Septra [Sulfamethoxazole-Trimethoprim] Nausea And Vomiting  . Tetracycline Nausea And Vomiting  . Toprol Xl [Metoprolol Tartrate] Nausea And Vomiting    Medications:  Prior to Admission:  Prescriptions Prior to Admission  Medication Sig Dispense Refill Last Dose  . albuterol (PROVENTIL HFA;VENTOLIN HFA)  108 (90 Base) MCG/ACT inhaler Inhale 2 puffs into the lungs every 6 (six) hours as needed for wheezing or shortness of breath.   week ago  . amLODipine (NORVASC) 2.5 MG tablet Take one tablet twice daily for blood pressure (Patient taking differently: Take 2.5 mg by mouth 2 (two) times daily. for blood pressure) 180 tablet 3 07/28/2016 at noon  . aspirin EC 81 MG tablet Take 81 mg by mouth daily.   07/28/2016 at Unknown time  . atenolol (TENORMIN) 50 MG tablet TAKE 1 TABLET DAILY TO HELPBLOOD PRESSURE (Patient taking differently: TAKE 1 TABLET  DAILY TO HELP BLOOD PRESSURE) 90 tablet 2 07/28/2016 at noon  . atorvastatin (LIPITOR) 20 MG tablet TAKE 1 TABLET DAILY AT     BEDTIME FOR CHOLESTEROL 90 tablet 3 07/27/2016 at pm  . calcium carbonate (TUMS EX) 750 MG chewable tablet Chew 1 tablet by mouth daily as needed for heartburn.    week ago  . citalopram (CELEXA) 10 MG tablet TAKE 1/2 TABLET DAILY FOR  DEPRESSION 45 tablet 3 07/27/2016 at Unknown time  . Dextromethorphan-Guaifenesin (MUCINEX DM MAXIMUM STRENGTH PO) Take 30 mLs by mouth daily as needed (congestion).    07/26/2016  . meclizine (ANTIVERT) 25 MG tablet Take 1 tablet (25 mg total) by mouth 3 (three) times daily as needed for dizziness or nausea. (Patient taking differently: Take 12.5-25 mg by mouth 3 (three) times daily as needed for dizziness or nausea. ) 15 tablet 0 07/28/2016 at Unknown time  . metFORMIN (GLUCOPHAGE) 500 MG tablet TAKE 1 TABLET EVERY MORNINGFOR BLOOD SUGAR (Patient taking differently: TAKE 1 TABLET EVERY MORNING FOR BLOOD SUGAR) 90 tablet 2 07/27/2016 at am  . naproxen sodium (ALEVE) 220 MG tablet Take 220 mg by mouth 2 (two) times daily with a meal.   07/27/2016 at pm  . nitrofurantoin (MACRODANTIN) 100 MG capsule Take one capsule by mouth once daily for bladder (Patient taking differently: Take 100 mg by mouth at bedtime as needed (urinary burning/pressure). ) 90 capsule 3 few nights ago  . Vitamin D, Ergocalciferol, (DRISDOL) 50000 units CAPS capsule TAKE 1 CAPSULE WEEKLY FOR  VITAMIN D (Patient taking differently: TAKE 1 CAPSULE BY MOUTH EVERY SATURDAY FOR  VITAMIN D) 12 capsule 1 07/20/2016   Scheduled: . atorvastatin  40 mg Oral q1800  . citalopram  5 mg Oral Daily  . clopidogrel  75 mg Oral Daily  . enoxaparin (LOVENOX) injection  40 mg Subcutaneous Daily  . insulin aspart  0-15 Units Subcutaneous TID WC    ROS: No headache, chest pain, SOB or limb pain.   Physical Examination: Blood pressure (!) 145/67, pulse (!) 47, resp. rate 13, SpO2 94 %.  HEENT:  [place]/AT Lungs: Respirations unlabored. Ext: Warm and well perfused  Neurologic Examination: Mental Status: Alert, oriented, thought content appropriate.  Speech fluent with intact comprehension, naming and repetition.  Able to follow all commands without difficulty. Cranial Nerves: II:  Visual fields intact, PERRL III,IV, VI: ptosis not present, EOMI without nystagmus V,VII: smile symmetric, facial temp sensation normal bilaterally VIII: hearing intact to voice IX,X: no hypophonia or hoarseness XI: Symmetric XII: midline tongue extension  Motor: Right : Upper extremity   5/5    Left:     Upper extremity   5/5  Lower extremity   5/5     Lower extremity   5/5 No asymmetry noted. Tone is normal.  Sensory: Temp and light touch intact x 4. No extinction.  Deep Tendon Reflexes:  2+  bilateral upper and lower extremities. Toes equivocal bilaterally. Cerebellar: No ataxia with FNF or RAM bilaterally. No asymmetry noted.  Gait: Deferred   Results for orders placed or performed during the hospital encounter of 07/28/16 (from the past 48 hour(s))  Urinalysis, Routine w reflex microscopic     Status: Abnormal   Collection Time: 07/28/16  7:15 PM  Result Value Ref Range   Color, Urine COLORLESS (A) YELLOW   APPearance CLEAR CLEAR   Specific Gravity, Urine 1.004 (L) 1.005 - 1.030   pH 7.0 5.0 - 8.0   Glucose, UA 50 (A) NEGATIVE mg/dL   Hgb urine dipstick SMALL (A) NEGATIVE   Bilirubin Urine NEGATIVE NEGATIVE   Ketones, ur NEGATIVE NEGATIVE mg/dL   Protein, ur NEGATIVE NEGATIVE mg/dL   Nitrite NEGATIVE NEGATIVE   Leukocytes, UA NEGATIVE NEGATIVE   RBC / HPF 0-5 0 - 5 RBC/hpf   WBC, UA 0-5 0 - 5 WBC/hpf   Bacteria, UA RARE (A) NONE SEEN   Squamous Epithelial / LPF 0-5 (A) NONE SEEN  CBC with Differential/Platelet     Status: None   Collection Time: 07/28/16  7:17 PM  Result Value Ref Range   WBC 8.8 4.0 - 10.5 K/uL   RBC 4.14 3.87 - 5.11 MIL/uL   Hemoglobin 13.0 12.0 - 15.0 g/dL   HCT  38.8 36.0 - 46.0 %   MCV 93.7 78.0 - 100.0 fL   MCH 31.4 26.0 - 34.0 pg   MCHC 33.5 30.0 - 36.0 g/dL   RDW 12.9 11.5 - 15.5 %   Platelets 166 150 - 400 K/uL   Neutrophils Relative % 72 %   Neutro Abs 6.3 1.7 - 7.7 K/uL   Lymphocytes Relative 20 %   Lymphs Abs 1.8 0.7 - 4.0 K/uL   Monocytes Relative 7 %   Monocytes Absolute 0.7 0.1 - 1.0 K/uL   Eosinophils Relative 1 %   Eosinophils Absolute 0.1 0.0 - 0.7 K/uL   Basophils Relative 0 %   Basophils Absolute 0.0 0.0 - 0.1 K/uL  Basic metabolic panel     Status: Abnormal   Collection Time: 07/28/16  7:17 PM  Result Value Ref Range   Sodium 133 (L) 135 - 145 mmol/L   Potassium 4.0 3.5 - 5.1 mmol/L   Chloride 100 (L) 101 - 111 mmol/L   CO2 24 22 - 32 mmol/L   Glucose, Bld 140 (H) 65 - 99 mg/dL   BUN 15 6 - 20 mg/dL   Creatinine, Ser 1.01 (H) 0.44 - 1.00 mg/dL   Calcium 9.4 8.9 - 10.3 mg/dL   GFR calc non Af Amer 50 (L) >60 mL/min   GFR calc Af Amer 58 (L) >60 mL/min    Comment: (NOTE) The eGFR has been calculated using the CKD EPI equation. This calculation has not been validated in all clinical situations. eGFR's persistently <60 mL/min signify possible Chronic Kidney Disease.    Anion gap 9 5 - 15  I-stat troponin, ED     Status: None   Collection Time: 07/28/16  7:28 PM  Result Value Ref Range   Troponin i, poc 0.01 0.00 - 0.08 ng/mL   Comment 3            Comment: Due to the release kinetics of cTnI, a negative result within the first hours of the onset of symptoms does not rule out myocardial infarction with certainty. If myocardial infarction is still suspected, repeat the test at appropriate intervals.    Ct  Head Wo Contrast  Result Date: 07/28/2016 CLINICAL DATA:  Left-sided weakness and dizziness left leg weakness EXAM: CT HEAD WITHOUT CONTRAST TECHNIQUE: Contiguous axial images were obtained from the base of the skull through the vertex without intravenous contrast. COMPARISON:  07/08/2016 FINDINGS: Brain: Focal  low attenuation within the left anterior, superior cerebellum, suspicious for a focal area of edema. No hemorrhage or mass. Old lacune in the right basal ganglia. Moderate periventricular, subcortical and deep white matter small vessel ischemic changes. The ventricles are stable in size. Mild cortical atrophy. Vascular: No hyperdense vessels. Carotid artery calcifications. Mild vertebral artery calcifications. Skull: No fracture or suspicious bone lesion Sinuses/Orbits: Osteoma right frontal sinus. Mild mucosal thickening in the ethmoid sinuses. Fluid level in the right sphenoid sinus. No acute orbital abnormality. Other: None IMPRESSION: 1. Focal hypodensity in the left anterior, superior cerebellum. This is suspicious for a focal area of edema/ischemia. MRI recommended for further evaluation. No hemorrhage. 2. Atrophy and chronic white matter small vessel ischemic changes 3. Fluid level in the sphenoid sinus with mucosal disease in the ethmoid sinuses. Electronically Signed   By: Donavan Foil M.D.   On: 07/28/2016 21:10    Assessment: 83 y.o. female with subacute left anterior cerebellar ischemic infarction 1. Classifiable as having failed ASA 2. Possible underlying etiologies for infarction include artery to artery embolization, in situ thrombosis from plaque rupture and cardioembolic stroke. Dissection unlikely as no history of trauma and no neck pain.  3. Stroke Risk Factors - HTN and HLD   Plan: 1. Agree with switching ASA to Plavix. 2. MRA of the brain without contrast 3. Carotid dopplers  4. Echocardiogram 5. PT consult, OT consult, Speech consult 6. Continue atorvastatin 7. Out of permissive HTN time window  8. Telemetry monitoring 9. Frequent neuro checks 10. HgbA1c, fasting lipid panel  '@Electronically'  signed: Dr. Kerney Elbe  07/28/2016, 10:03 PM

## 2016-07-28 NOTE — ED Notes (Signed)
Admitting provider at the bedside.

## 2016-07-28 NOTE — ED Provider Notes (Signed)
[facility] DEPT Provider Note   CSN: [id number] Arrival date & time: 07/28/16  3846     History   Chief Complaint Chief Complaint  Patient presents with  . Weakness  . Dizziness    HPI Penny Miller is a 83 y.o. female. Complaint is dizziness, and left leg weakness.  HPI:  83 year old female presents with a "another episode". Patient seen and evaluated here 2 weeks ago with an episode of vertigo. Became a symptomatically in the emergency room. Had normal head CT. Was discharged home. Did well until today. Literally has no additional episodes in between his otherwise been at her normal baseline level of function home without vertigo weakness or other neurological symptoms.  She waking this morning at 7. States when she was trying to walk she was dizzy. She describes this as feeling like she was drunk and her left leg was very weak and she required assistance to walk. Her family helped her get back into bed where she stayed until presenting here at 6:55 PM some 12 hours after onset of symptoms.  She denies headache. No vision changes other than a feeling of things are spinning if she tends to move. No upper extremity weakness. Her left leg she states was almost completely flaccid at home but has improved the point she feels it is normal now.  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 No history of strokes.  Past Medical History:  Diagnosis Date  . Allergic rhinitis, cause unspecified   . Anxiety state, unspecified   . Diaphragmatic hernia without mention of obstruction or gangrene   . Diverticulosis of colon (without mention of hemorrhage)   . Female stress incontinence   . Insomnia, unspecified   . Interstitial cystitis    surgery 02/01/2013  . Irritable bowel syndrome   . Memory loss   . Other and unspecified hyperlipidemia   . Other premature beats   . Polyneuropathy in diabetes(357.2)   . Postmenopausal bleeding   .  Proteinuria   . 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 Unspecified essential hypertension   . Viral warts, unspecified     Patient Active Problem List   Diagnosis Date Noted  . CKD (chronic kidney disease) stage 2, GFR 60-89 ml/min 05/08/2016  . Palpitations 06/27/2015  . Dyspnea 06/27/2015  . Trigger finger 06/27/2015  . History of fall 02/28/2015  . DM (diabetes mellitus), type 2 with renal complications ([place]) 65/99/3570  . Sinusitis, chronic 06/22/2014  . Varicose vein of leg 12/21/2013  . Seborrheic keratosis 12/21/2013  . Deaf 06/15/2013  . Bladder infection, chronic 01/04/2013  . 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 Polyneuropathy in diabetes(357.2)   . Memory loss   . Insomnia, unspecified   . Essential hypertension   . Lipoma of unspecified site   . Hyperlipidemia   . Pain in joint, ankle and foot 11/20/2012  . Herpes zoster infection of thoracic region 08/28/2012  . Chronic interstitial cystitis     Past Surgical History:  Procedure Laterality Date  . ABDOMINAL HYSTERECTOMY  1977   Dr.Hambright  . APPENDECTOMY  1965  . CATARACT EXTRACTION, BILATERAL  02/10/2006   Dr.Groat   . COLONOSCOPY  07/17/2004   polypectomy  . [place]  . TRIGGER FINGER RELEASE  1990   Dr.Carter  . TRIGGER FINGER RELEASE  05/13/16  . urinary bladder      surgery 02/01/2013 to stretch bladder stem    OB History    No data available       Home Medications    Prior to Admission medications   Medication Sig Start Date End Date Taking? Authorizing Provider  albuterol (PROVENTIL HFA;VENTOLIN HFA) 108 (90 Base) MCG/ACT inhaler Inhale 2 puffs into the lungs every 6 (six) hours as needed for wheezing or shortness of breath.   Yes Historical Provider, MD  amLODipine (NORVASC) 2.5 MG tablet Take one tablet  twice daily for blood pressure Patient taking differently: Take 2.5 mg by mouth 2 (two) times daily. for blood pressure 05/22/16  Yes Estill Dooms, MD  aspirin EC 81 MG tablet Take 81 mg by mouth daily.   Yes Historical Provider, MD  atenolol (TENORMIN) 50 MG tablet TAKE 1 TABLET DAILY TO HELPBLOOD PRESSURE Patient taking differently: TAKE 1 TABLET DAILY TO HELP BLOOD PRESSURE 02/12/16  Yes Estill Dooms, MD  atorvastatin (LIPITOR) 20 MG tablet TAKE 1 TABLET DAILY AT     BEDTIME FOR CHOLESTEROL 06/25/16  Yes Estill Dooms, MD  calcium carbonate (TUMS EX) 750 MG chewable tablet Chew 1 tablet by mouth daily as needed for heartburn.    Yes Historical Provider, MD  citalopram (CELEXA) 10 MG tablet TAKE 1/2 TABLET DAILY FOR  DEPRESSION 09/11/15  Yes Lauree Chandler, NP  Dextromethorphan-Guaifenesin (MUCINEX DM MAXIMUM STRENGTH PO) Take 30 mLs by mouth daily as needed (congestion).    Yes Historical Provider, MD  meclizine (ANTIVERT) 25 MG tablet Take 1 tablet (25 mg total) by mouth 3 (three) times daily as needed for dizziness or nausea. Patient taking differently: Take 12.5-25 mg by mouth 3 (three) times daily as needed for dizziness or nausea.  07/08/16  Yes Benavides, PA-C  metFORMIN (GLUCOPHAGE) 500 MG tablet TAKE 1 TABLET EVERY MORNINGFOR BLOOD SUGAR Patient taking differently: TAKE 1 TABLET EVERY MORNING FOR BLOOD SUGAR 02/12/16  Yes Estill Dooms, MD  naproxen sodium (ALEVE) 220 MG tablet Take 220 mg by mouth 2 (two) times daily with a meal.   Yes Historical Provider, MD  nitrofurantoin (MACRODANTIN) 100 MG capsule Take one capsule by mouth once daily for bladder Patient taking differently: Take 100 mg by mouth at bedtime as needed (urinary burning/pressure).  09/11/15  Yes Estill Dooms, MD  Vitamin D, Ergocalciferol, (DRISDOL) 50000 units CAPS capsule TAKE 1 CAPSULE WEEKLY FOR  VITAMIN D Patient taking differently: TAKE 1 CAPSULE BY MOUTH EVERY SATURDAY FOR  VITAMIN D 03/15/16  Yes  Estill Dooms, MD    Family History Family History  Problem Relation Age of Onset  . Cancer Mother     pancreatic  . Cancer Father     bladder  . Hypertension Sister   . Diabetes Sister   . Diabetes Brother   . Kidney cancer Brother   . Hypertension Brother   . Diabetes Brother   . Heart disease Brother 48    Myocardial Infarction     Social History Social History  Substance Use Topics  . Smoking status: Former Smoker    Quit date: 02/25/2011  . Smokeless tobacco: Never Used  . Alcohol use No     Allergies   Ceclor [cefaclor]; Codeine; Lopid [gemfibrozil]; Metoprolol; Morphine and related; Niacin and related; Relafen [nabumetone]; Septra [sulfamethoxazole-trimethoprim]; Tetracycline; and Toprol xl [metoprolol tartrate]   Review of Systems Review of Systems  Constitutional: Negative for appetite change, chills,  diaphoresis, fatigue and fever.  HENT: Negative for mouth sores, sore throat and trouble swallowing.   Eyes: Negative for visual disturbance.  Respiratory: Negative for cough, chest tightness, shortness of breath and wheezing.   Cardiovascular: Negative for chest pain.  Gastrointestinal: Negative for abdominal distention, abdominal pain, diarrhea, nausea and vomiting.  Endocrine: Negative for polydipsia, polyphagia and polyuria.  Genitourinary: Negative for dysuria, frequency and hematuria.  Musculoskeletal: Negative for gait problem.  Skin: Negative for color change, pallor and rash.  Neurological: Positive for dizziness and weakness. Negative for syncope, light-headedness and headaches.  Hematological: Does not bruise/bleed easily.  Psychiatric/Behavioral: Negative for behavioral problems and confusion.     Physical Exam Updated Vital Signs BP (!) 189/79   Pulse (!) 51   Resp 18   SpO2 94%   Physical Exam  Constitutional: She is oriented to person, place, and time. She appears well-developed and well-nourished. No distress.  HENT:  Head:  Normocephalic.  Eyes: Conjunctivae are normal. Pupils are equal, round, and reactive to light. No scleral icterus.  Neck: Normal range of motion. Neck supple. No thyromegaly present.  Cardiovascular: Normal rate and regular rhythm.  Exam reveals no gallop and no friction rub.   No murmur heard. Pulmonary/Chest: Effort normal and breath sounds normal. No respiratory distress. She has no wheezes. She has no rales.  Abdominal: Soft. Bowel sounds are normal. She exhibits no distension. There is no tenderness. There is no rebound.  Musculoskeletal: Normal range of motion.  Neurological: She is alert and oriented to person, place, and time.  Toradol nystagmus to lateral to lateral gaze. No other cranial nerve deficits. No pronator drift or leg drift. Normal finger to nose.  Skin: Skin is warm and dry. No rash noted.  Psychiatric: She has a normal mood and affect. Her behavior is normal.     ED Treatments / Results  Labs (all labs ordered are listed, but only abnormal results are displayed) Labs Reviewed  BASIC METABOLIC PANEL - Abnormal; Notable for the following:       Result Value   Sodium 133 (*)    Chloride 100 (*)    Glucose, Bld 140 (*)    Creatinine, Ser 1.01 (*)    GFR calc non Af Amer 50 (*)    GFR calc Af Amer 58 (*)    All other components within normal limits  URINALYSIS, ROUTINE W REFLEX MICROSCOPIC - Abnormal; Notable for the following:    Color, Urine COLORLESS (*)    Specific Gravity, Urine 1.004 (*)    Glucose, UA 50 (*)    Hgb urine dipstick SMALL (*)    Bacteria, UA RARE (*)    Squamous Epithelial / LPF 0-5 (*)    All other components within normal limits  CBC WITH DIFFERENTIAL/PLATELET  Randolm Idol, ED    EKG  EKG Interpretation  Date/Time:  Sunday July 28 2016 19:02:12 EDT Ventricular Rate:  52 PR Interval:    QRS Duration: 92 QT Interval:  490 QTC Calculation: 456 R Axis:   69 Text Interpretation:  Sinus rhythm Borderline prolonged PR interval  Confirmed by Jeneen Rinks  MD, [place] ([id number]) on 07/28/2016 7:33:43 PM       Radiology Ct Head Wo Contrast  Result Date: 07/28/2016 CLINICAL DATA:  Left-sided weakness and dizziness left leg weakness EXAM: CT HEAD WITHOUT CONTRAST TECHNIQUE: Contiguous axial images were obtained from the base of the skull through the vertex without intravenous contrast. COMPARISON:  07/08/2016 FINDINGS: Brain: Focal low attenuation within the  left anterior, superior cerebellum, suspicious for a focal area of edema. No hemorrhage or mass. Old lacune in the right basal ganglia. Moderate periventricular, subcortical and deep white matter small vessel ischemic changes. The ventricles are stable in size. Mild cortical atrophy. Vascular: No hyperdense vessels. Carotid artery calcifications. Mild vertebral artery calcifications. Skull: No fracture or suspicious bone lesion Sinuses/Orbits: Osteoma right frontal sinus. Mild mucosal thickening in the ethmoid sinuses. Fluid level in the right sphenoid sinus. No acute orbital abnormality. Other: None IMPRESSION: 1. Focal hypodensity in the left anterior, superior cerebellum. This is suspicious for a focal area of edema/ischemia. MRI recommended for further evaluation. No hemorrhage. 2. Atrophy and chronic white matter small vessel ischemic changes 3. Fluid level in the sphenoid sinus with mucosal disease in the ethmoid sinuses. Electronically Signed   By: Donavan Foil M.D.   On: 07/28/2016 21:10   Mr Brain W And Wo Contrast  Result Date: 07/28/2016 CLINICAL DATA:  Vertigo, follow-up abnormal CT. Woke up today with dizziness and LEFT-sided weakness. History of diabetes, hypertension, memory loss. EXAM: MRI HEAD WITHOUT AND WITH CONTRAST TECHNIQUE: Multiplanar, multiecho pulse sequences of the brain and surrounding structures were obtained without and with intravenous contrast. CONTRAST:  12mL MULTIHANCE GADOBENATE DIMEGLUMINE 529 MG/ML IV SOLN COMPARISON:  CT HEAD July 28, 2016 at 2007 hours  FINDINGS: Postcontrast sequences are mildly motion degraded. INTRACRANIAL CONTENTS: 21 x 18 mm area reduced diffusion LEFT superior cerebellum with low ADC values within superior cerebellar artery territory. No susceptibility artifact to suggest acute hemorrhage ; a few punctate chronic micro hemorrhages associated with hypertension. The ventricles and sulci are normal for patient's age. No suspicious parenchymal signal, masses, mass effect. Patchy to confluent supratentorial and pontine white matter FLAIR T2 hyperintensities. 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 No abnormal intraparenchymal or extra-axial enhancement. No abnormal extra-axial fluid collections. No extra-axial masses. VASCULAR: Normal major intracranial vascular flow voids present at skull base. SKULL AND UPPER CERVICAL SPINE: No abnormal sellar expansion. No suspicious calvarial bone marrow signal. Craniocervical junction maintained. Grade 1 C3-4 anterolisthesis. SINUSES/ORBITS: Small RIGHT sphenoid sinus air-fluid level. Mastoid air cells are well aerated. The included ocular globes and orbital contents are non-suspicious. Status post bilateral ocular lens implants. OTHER: None. IMPRESSION: Acute LEFT cerebellar/SCA territory nonhemorrhagic infarct corresponding to CT abnormality. Moderate chronic small vessel ischemic disease and old bilateral small cerebellar infarcts. Electronically Signed   By: Elon Alas M.D.   On: 07/28/2016 23:24    Procedures Procedures (including critical care time)  Medications Ordered in ED Medications  gadobenate dimeglumine (MULTIHANCE) injection 15 mL (15 mLs Intravenous Contrast Given 07/28/16 2308)     Initial Impression / Assessment and Plan / ED Course  I have reviewed the triage vital signs and the nursing notes.  Pertinent labs & imaging results that were available during my care of the patient were reviewed by me and considered in my medical decision making (see chart for details).       CT scan shows areas cerebellar edema consistent with infarct. MRI requested. I discussed the case with Dr. Aldean Jewett of neurology will see the patient in consult. Care discussed with Dr. Inda Merlin of [facility] hospitalist. Patient be admitted for further stroke evaluation  Final Clinical Impressions(s) / ED Diagnoses   Final diagnoses:  Cerebrovascular accident (CVA), unspecified mechanism ([place])    New Prescriptions New Prescriptions   No medications on file     Tanna Furry, MD 07/28/16 2336

## 2016-07-28 NOTE — ED Notes (Signed)
Patient transported to CT 

## 2016-07-29 ENCOUNTER — Inpatient Hospital Stay (HOSPITAL_COMMUNITY): Payer: Medicare Other

## 2016-07-29 DIAGNOSIS — R001 Bradycardia, unspecified: Secondary | ICD-10-CM | POA: Diagnosis not present

## 2016-07-29 DIAGNOSIS — F329 Major depressive disorder, single episode, unspecified: Secondary | ICD-10-CM | POA: Diagnosis present

## 2016-07-29 DIAGNOSIS — H9190 Unspecified hearing loss, unspecified ear: Secondary | ICD-10-CM | POA: Diagnosis present

## 2016-07-29 DIAGNOSIS — F17210 Nicotine dependence, cigarettes, uncomplicated: Secondary | ICD-10-CM | POA: Diagnosis present

## 2016-07-29 DIAGNOSIS — E1151 Type 2 diabetes mellitus with diabetic peripheral angiopathy without gangrene: Secondary | ICD-10-CM | POA: Diagnosis present

## 2016-07-29 DIAGNOSIS — Z79899 Other long term (current) drug therapy: Secondary | ICD-10-CM | POA: Diagnosis not present

## 2016-07-29 DIAGNOSIS — I6340 Cerebral infarction due to embolism of unspecified cerebral artery: Secondary | ICD-10-CM | POA: Diagnosis not present

## 2016-07-29 DIAGNOSIS — I340 Nonrheumatic mitral (valve) insufficiency: Secondary | ICD-10-CM | POA: Diagnosis not present

## 2016-07-29 DIAGNOSIS — Z90710 Acquired absence of both cervix and uterus: Secondary | ICD-10-CM | POA: Diagnosis not present

## 2016-07-29 DIAGNOSIS — E1122 Type 2 diabetes mellitus with diabetic chronic kidney disease: Secondary | ICD-10-CM | POA: Diagnosis present

## 2016-07-29 DIAGNOSIS — E878 Other disorders of electrolyte and fluid balance, not elsewhere classified: Secondary | ICD-10-CM | POA: Diagnosis present

## 2016-07-29 DIAGNOSIS — N3010 Interstitial cystitis (chronic) without hematuria: Secondary | ICD-10-CM | POA: Diagnosis present

## 2016-07-29 DIAGNOSIS — Z6829 Body mass index (BMI) 29.0-29.9, adult: Secondary | ICD-10-CM | POA: Diagnosis not present

## 2016-07-29 DIAGNOSIS — E663 Overweight: Secondary | ICD-10-CM | POA: Diagnosis present

## 2016-07-29 DIAGNOSIS — E7800 Pure hypercholesterolemia, unspecified: Secondary | ICD-10-CM | POA: Diagnosis present

## 2016-07-29 DIAGNOSIS — E785 Hyperlipidemia, unspecified: Secondary | ICD-10-CM | POA: Diagnosis not present

## 2016-07-29 DIAGNOSIS — F0390 Unspecified dementia without behavioral disturbance: Secondary | ICD-10-CM | POA: Diagnosis present

## 2016-07-29 DIAGNOSIS — Z7984 Long term (current) use of oral hypoglycemic drugs: Secondary | ICD-10-CM | POA: Diagnosis not present

## 2016-07-29 DIAGNOSIS — E1142 Type 2 diabetes mellitus with diabetic polyneuropathy: Secondary | ICD-10-CM | POA: Diagnosis present

## 2016-07-29 DIAGNOSIS — E871 Hypo-osmolality and hyponatremia: Secondary | ICD-10-CM | POA: Diagnosis present

## 2016-07-29 DIAGNOSIS — Z7982 Long term (current) use of aspirin: Secondary | ICD-10-CM | POA: Diagnosis not present

## 2016-07-29 DIAGNOSIS — I639 Cerebral infarction, unspecified: Secondary | ICD-10-CM | POA: Diagnosis not present

## 2016-07-29 DIAGNOSIS — I6300 Cerebral infarction due to thrombosis of unspecified precerebral artery: Secondary | ICD-10-CM | POA: Diagnosis not present

## 2016-07-29 DIAGNOSIS — N182 Chronic kidney disease, stage 2 (mild): Secondary | ICD-10-CM | POA: Diagnosis present

## 2016-07-29 DIAGNOSIS — R270 Ataxia, unspecified: Secondary | ICD-10-CM | POA: Diagnosis present

## 2016-07-29 DIAGNOSIS — F411 Generalized anxiety disorder: Secondary | ICD-10-CM | POA: Diagnosis present

## 2016-07-29 DIAGNOSIS — G459 Transient cerebral ischemic attack, unspecified: Secondary | ICD-10-CM | POA: Diagnosis not present

## 2016-07-29 DIAGNOSIS — I726 Aneurysm of vertebral artery: Secondary | ICD-10-CM | POA: Diagnosis present

## 2016-07-29 DIAGNOSIS — I63442 Cerebral infarction due to embolism of left cerebellar artery: Secondary | ICD-10-CM | POA: Diagnosis present

## 2016-07-29 DIAGNOSIS — Z8673 Personal history of transient ischemic attack (TIA), and cerebral infarction without residual deficits: Secondary | ICD-10-CM | POA: Diagnosis present

## 2016-07-29 DIAGNOSIS — I10 Essential (primary) hypertension: Secondary | ICD-10-CM | POA: Diagnosis not present

## 2016-07-29 LAB — LIPID PANEL
Cholesterol: 162 mg/dL (ref 0–200)
HDL: 38 mg/dL — ABNORMAL LOW (ref >40)
LDL CALC: 81 mg/dL (ref 0–99)
TRIGLYCERIDES: 217 mg/dL — AB (ref <150)
Total CHOL/HDL Ratio: 4.3 RATIO
VLDL: 43 mg/dL — ABNORMAL HIGH (ref 0–40)

## 2016-07-29 LAB — GLUCOSE, CAPILLARY
GLUCOSE-CAPILLARY: 183 mg/dL — AB (ref 65–99)
GLUCOSE-CAPILLARY: 86 mg/dL (ref 65–99)
GLUCOSE-CAPILLARY: 194 mg/dL — AB (ref 65–99)
Glucose-Capillary: 116 mg/dL — ABNORMAL HIGH (ref 65–99)

## 2016-07-29 LAB — TSH: TSH: 4.432 u[IU]/mL (ref 0.350–4.500)

## 2016-07-29 MED ORDER — AMLODIPINE BESYLATE 2.5 MG PO TABS
2.5000 mg | ORAL_TABLET | Freq: Every day | ORAL | Status: DC
  Administered 2016-07-29 – 2016-07-31 (×3): 2.5 mg via ORAL
  Filled 2016-07-29 (×3): qty 1

## 2016-07-29 MED ORDER — ATORVASTATIN CALCIUM 40 MG PO TABS
40.0000 mg | ORAL_TABLET | Freq: Every day | ORAL | Status: DC
  Administered 2016-07-29 – 2016-07-31 (×3): 40 mg via ORAL
  Filled 2016-07-29 (×3): qty 1

## 2016-07-29 MED ORDER — MECLIZINE HCL 12.5 MG PO TABS: 12.5000 mg | ORAL_TABLET | Freq: Three times a day (TID) | ORAL | Status: DC | PRN

## 2016-07-29 MED ORDER — CITALOPRAM HYDROBROMIDE 10 MG PO TABS
5.0000 mg | ORAL_TABLET | Freq: Every day | ORAL | Status: DC
  Administered 2016-07-29 – 2016-07-31 (×3): 5 mg via ORAL
  Filled 2016-07-29 (×3): qty 1

## 2016-07-29 MED ORDER — ENOXAPARIN SODIUM 40 MG/0.4ML ~~LOC~~ SOLN
40.0000 mg | Freq: Every day | SUBCUTANEOUS | Status: DC
  Administered 2016-07-29 – 2016-07-31 (×3): 40 mg via SUBCUTANEOUS
  Filled 2016-07-29 (×3): qty 0.4

## 2016-07-29 MED ORDER — CLOPIDOGREL BISULFATE 75 MG PO TABS
75.0000 mg | ORAL_TABLET | Freq: Every day | ORAL | Status: DC
  Administered 2016-07-29 – 2016-07-31 (×3): 75 mg via ORAL
  Filled 2016-07-29 (×3): qty 1

## 2016-07-29 MED ORDER — SENNOSIDES-DOCUSATE SODIUM 8.6-50 MG PO TABS: 1.0000 | ORAL_TABLET | Freq: Every evening | ORAL | Status: DC | PRN

## 2016-07-29 MED ORDER — ACETAMINOPHEN 325 MG PO TABS: 650.0000 mg | ORAL_TABLET | ORAL | Status: DC | PRN

## 2016-07-29 MED ORDER — IOPAMIDOL (ISOVUE-370) INJECTION 76%
INTRAVENOUS | Status: AC
  Administered 2016-07-29: 50 mL
  Filled 2016-07-29: qty 50

## 2016-07-29 MED ORDER — INSULIN ASPART 100 UNIT/ML ~~LOC~~ SOLN
0.0000 [IU] | Freq: Three times a day (TID) | SUBCUTANEOUS | Status: DC
  Administered 2016-07-29: 3 [IU] via SUBCUTANEOUS
  Administered 2016-07-30: 2 [IU] via SUBCUTANEOUS
  Administered 2016-07-31: 3 [IU] via SUBCUTANEOUS

## 2016-07-29 MED ORDER — ACETAMINOPHEN 160 MG/5ML PO SOLN: 650.0000 mg | ORAL | Status: DC | PRN

## 2016-07-29 MED ORDER — STROKE: EARLY STAGES OF RECOVERY BOOK
Freq: Once | Status: AC
  Administered 2016-07-29: 03:00:00

## 2016-07-29 MED ORDER — ACETAMINOPHEN 650 MG RE SUPP: 650.0000 mg | RECTAL | Status: DC | PRN

## 2016-07-29 MED ORDER — SODIUM CHLORIDE 0.9 % IV SOLN
INTRAVENOUS | Status: DC
  Administered 2016-07-29: 1000 mL via INTRAVENOUS
  Administered 2016-07-29: 03:00:00 via INTRAVENOUS

## 2016-07-29 NOTE — Progress Notes (Signed)
Physical Therapy Evaluation Patient Details Name: Penny Miller MRN: [id number] DOB: Aug 13, 1931 Today's Date: 07/29/2016   History of Present Illness  83 yo female with pmh of DM, HTN, admitted with L cerebellar/ SCA territory infarct and slurred speech.  Clinical Impression  Pt admitted with/for s/s of stroke with weakness and vertigo.  On eval s/s have decreased in severity.  Pt currently limited functionally due to the problems listed below.  (see problems list.)  Pt will benefit from PT to maximize function and safety to be able to get home safely with available assist of family.     Follow Up Recommendations Outpatient PT;Supervision for mobility/OOB    Equipment Recommendations  None recommended by PT    Recommendations for Other Services       Precautions / Restrictions Precautions Precautions: Fall      Mobility  Bed Mobility Overal bed mobility: Independent             General bed mobility comments: rolled and up via left side no rail, no s/s vertigo.  Transfers Overall transfer level: Needs assistance Equipment used: 1 person hand held assist Transfers: Sit to/from Stand Sit to Stand: Min guard         General transfer comment: no assist needed,   Ambulation/Gait Ambulation/Gait assistance: Min guard;Min assist Ambulation Distance (Feet): 100 Feet Assistive device:  (iv pole) Gait Pattern/deviations: Step-through pattern Gait velocity: slower Gait velocity interpretation: Below normal speed for age/gender General Gait Details: generally steady with IV pole.  some deviation with scanning L/R and up /down  Stairs            Wheelchair Mobility    Modified Rankin (Stroke Patients Only) Modified Rankin (Stroke Patients Only) Pre-Morbid Rankin Score: No symptoms Modified Rankin: Moderately severe disability     Balance Overall balance assessment: Needs assistance Sitting-balance support: No upper extremity supported;Feet  supported Sitting balance-Leahy Scale: Normal     Standing balance support: During functional activity;No upper extremity supported Standing balance-Leahy Scale: Fair                               Pertinent Vitals/Pain Pain Assessment: No/denies pain Faces Pain Scale: Hurts a little bit Pain Intervention(s): Monitored during session    Home Living Family/patient expects to be discharged to:: Private residence Living Arrangements: Children Available Help at Discharge: Family (son works 10-12 hours per day.) Type of Home: House Home Access: Stairs to enter Entrance Stairs-Rails:  (rail but loose and unsteady) Technical brewer of Steps: 5 Home Layout: One level Home Equipment: None Additional Comments: baseline enjoys being outside and mowing the grass. pt manages her own medications and bills. pt writes all her information in a notebook but currently no family members check behind her to ensure no errors are being made    Prior Function Level of Independence: Independent               Hand Dominance   Dominant Hand: Right    Extremity/Trunk Assessment   Upper Extremity Assessment Upper Extremity Assessment: Defer to OT evaluation    Lower Extremity Assessment Lower Extremity Assessment: Generalized weakness (bil >=4/5, no focal weakness, spyder web feeling face and le)    Cervical / Trunk Assessment Cervical / Trunk Assessment: Normal  Communication   Communication: HOH  Cognition Arousal/Alertness: Awake/alert Behavior During Therapy: WFL for tasks assessed/performed Overall Cognitive Status: History of cognitive impairments - at baseline  General Comments: family reports that she has trouble with recall of names sometimes but otherwise no deficits noted in functional task.       General Comments General comments (skin integrity, edema, etc.): Some minor nystagmus with pursuits in hall with  walking, otherwise no mystagmus with general movement and pt reporting less overall dizziness/vertigo or nausea.    Exercises     Assessment/Plan    PT Assessment Patient needs continued PT services  PT Problem List Decreased strength;Decreased activity tolerance;Decreased balance;Decreased mobility;Decreased knowledge of use of DME       PT Treatment Interventions Gait training;Stair training;Functional mobility training;DME instruction;Therapeutic activities;Balance training;Patient/family education;Neuromuscular re-education    PT Goals (Current goals can be found in the Care Plan section)  Acute Rehab PT Goals Patient Stated Goal: to return to yard work PT Goal Formulation: With patient Potential to Achieve Goals: Good    Frequency Min 3X/week   Barriers to discharge        Co-evaluation               AM-PAC PT "6 Clicks" Daily Activity  Outcome Measure Difficulty turning over in bed (including adjusting bedclothes, sheets and blankets)?: None Difficulty moving from lying on back to sitting on the side of the bed? : None Difficulty sitting down on and standing up from a chair with arms (e.g., wheelchair, bedside commode, etc,.)?: A Little Help needed moving to and from a bed to chair (including a wheelchair)?: A Little Help needed walking in hospital room?: A Little Help needed climbing 3-5 steps with a railing? : A Lot 6 Click Score: 19    End of Session   Activity Tolerance: Patient tolerated treatment well Patient left: in bed;with call bell/phone within reach;with bed alarm set;with family/visitor present Nurse Communication: Mobility status PT Visit Diagnosis: Unsteadiness on feet (R26.81);Other abnormalities of gait and mobility (R26.89);Dizziness and giddiness (R42)    Time: 3343-5686 PT Time Calculation (min) (ACUTE ONLY): 32 min   Charges:   PT Evaluation $PT Eval Moderate Complexity: 1 Procedure PT Treatments $Gait Training: 8-22 mins   PT G  Codes:        08-11-16  Donnella Sham, PT [phone]  (pager)  Tessie Fass Ras Kollman 08-11-2016, 4:57 PM

## 2016-07-29 NOTE — Progress Notes (Signed)
STROKE TEAM PROGRESS NOTE   SUBJECTIVE (INTERVAL HISTORY) Patient is accompanied by her husband. She states she has noted improvement in her dizziness and nausea. She was able to get up this morning and walked to the restroom. She denies any prior history of strokes or any history suggestive history of atrial fibrillation or cardiac arrhythmias   OBJECTIVE Temp:  [97.6 F (36.4 C)-98.2 F (36.8 C)] 97.6 F (36.4 C) (04/30 0600) Pulse Rate:  [31-52] 49 (04/30 0600) Cardiac Rhythm: Sinus bradycardia (04/30 0700) Resp:  [13-20] 20 (04/30 0600) BP: (111-189)/(48-81) 162/53 (04/30 0600) SpO2:  [92 %-97 %] 92 % (04/30 0600) FiO2 (%):  [0 %] 0 % (04/30 0158) Weight:  [81.6 kg (180 lb)] 81.6 kg (180 lb) (04/30 0200)  CBC:  Recent Labs Lab 07/28/16 1917  WBC 8.8  NEUTROABS 6.3  HGB 13.0  HCT 38.8  MCV 93.7  PLT 287    Basic Metabolic Panel:  Recent Labs Lab 07/28/16 1917  NA 133*  K 4.0  CL 100*  CO2 24  GLUCOSE 140*  BUN 15  CREATININE 1.01*  CALCIUM 9.4   HgbA1c:  Lab Results  Component Value Date   HGBA1C 6.3 (H) 05/07/2016     PHYSICAL EXAM Pleasant elderly Caucasian lady not in distress. . Afebrile. Head is nontraumatic. Neck is supple without bruit.    Cardiac exam no murmur or gallop. Lungs are clear to auscultation. Distal pulses are well felt. Neurological Exam :  Awake alert oriented 3 with normal speech and language function. No aphasia, dysarthria or apraxia. She follows commands well. Intact attention, registration and recall. Fundi were not visualized. Extraocular moments are full range without nystagmus. Slight impairment of psychiatric I moments to the left. Face is symmetric without weakness. Tongue is midline. Motor system exam reveals no upper or lower eczema to drift. Slight impairment of finger-to-nose and knee to heel coordination on the left. Mild giveaway weakness of the left grip compared to the right. Deep tendon reflexes are 2+ symmetric.  Plantars are downgoing. Sensation is intact. Gait was not tested. ASSESSMENT/PLAN Ms. ZOHA SPRANGER is a 83 y.o. female with history of memory loss, HTN, HLD, diabetes presenting with severe vertigo, nausea, dry heaves, slurred speech and unsteady on her feet with L arm and leg weakness. She did not receive IV t-PA due to delay in arrival.   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 small vessel disease. Old B small cerebellar infarcts.   CTA head and neck pending  2D Echo  Pending  TEE and loop if about test are unyielding  LDL 81  HgbA1c pending  Lovenox 40 mg sq daily for VTE prophylaxis  Diet heart healthy/carb modified Room service appropriate? Yes; Fluid consistency: Thin  aspirin 81 mg daily prior to admission, now on clopidogrel 75 mg daily  Therapy recommendations:  pending   Disposition:  pending   Hypertension  Stable  Permissive hypertension (OK if < 220/120) but gradually normalize in 5-7 days  Long-term BP goal normotensive  Hyperlipidemia  Home meds:  lipitor 20  Lipitor increased to 40 mg in hospital  LDL 81 goal  Continue statin at discharge  Diabetes  HgbA1c goal < 7.0  Glucose elevated 140  Other Stroke Risk Factors  Advanced age  Former Cigarette smoker, quit 5 years ago  UDS / ETOH level  not performed   Overweight, Body mass index is 29.05 kg/m.  Hospital day # 0  I have personally examined this patient, reviewed notes, independently viewed imaging studies, participated in medical decision making and plan of care.ROS completed by me personally and pertinent positives fully documented  I have made any additions or clarifications directly to the above note. Agree with note above.  She presented with dizziness nausea and ataxia secondary to left superior cerebellar  artery branch infarct likely embolic etiology source to be determined. Recommend check CT angiogram of brain and neck, transthoracic echo and if no definite etiology is found consider TEE and loop recorder for paroxysmal atrial fibrillation. Change aspirin to Plavix for stroke prevention. Long discussion of the bedside with the patient and husband and answered questions. Discussed with Dr. Carolin Sicks. Greater than 50% time during this 35 minute visit was spent on counseling and coordination of care about embolic stroke, discussion about evaluation and treatment plan and answering questions.  Antony Contras, MD Medical Director [place] Pager: [phone] 07/29/2016 3:09 PM   To contact Stroke Continuity provider, please refer to [email or web address] After hours, contact General Neurology

## 2016-07-29 NOTE — Care Management Note (Signed)
Case Management Note  Patient Details  Name: Penny Miller MRN: [id number] Date of Birth: 01-Aug-1931  Subjective/Objective:     Pt admitted with CVA. She is from home with her son.                Action/Plan: Awaiting PT/OT recommendations. CM following for d/c needs, physician orders.   Expected Discharge Date:                  Expected Discharge Plan:     In-House Referral:     Discharge planning Services     Post Acute Care Choice:    Choice offered to:     DME Arranged:    DME Agency:     HH Arranged:    HH Agency:     Status of Service:  In process, will continue to follow  If discussed at Long Length of Stay Meetings, dates discussed:    Additional Comments:  Pollie Friar, RN 07/29/2016, 11:12 AM

## 2016-07-29 NOTE — Progress Notes (Signed)
    [facility] has been requested to perform a transesophageal echocardiogram on Penny Miller for stroke.  After careful review of history and examination, the risks and benefits of transesophageal echocardiogram have been explained including risks of esophageal damage, perforation (1:10,000 risk), bleeding, pharyngeal hematoma as well as other potential complications associated with conscious sedation including aspiration, arrhythmia, respiratory failure and death. Alternatives to treatment were discussed, questions were answered. Patient is willing to proceed.  TEE - Dr. Johnsie Cancel  @  1500. NPO after midnight. Meds with sips.   Aldair Rickel, PA-C 07/29/2016 4:15 PM

## 2016-07-29 NOTE — ED Notes (Signed)
Pt placed on bedpan

## 2016-07-29 NOTE — Progress Notes (Signed)
PROGRESS NOTE    Penny Miller  KZL:[id number] DOB: 1931/08/16 DOA: 07/28/2016 PCP: Jeanmarie Hubert, MD   Brief Narrative: Penny Miller is an 83 year old female with a medical history significant for hypertension, chronic kidney disease stage 2, hyperlipidemia (on statin and ASA therapy), type 2 diabetes, dementia, and extensive history of tobacco abuse with no known history of prior stroke who presented to the emergency department on 07/28/16 complaining of dizziness and left leg weakness. Patient had presented to the ED on 4/9 with similar complaints and negative head CT and discharged with a diagnosis of vertigo. Physical exam on 4/29 was positive for lateral gaze nystagmus and no focal neurological deficits. Negative UA, electrolyte abnormalities. EKG shows mildly prolonged QT. CT showed focal hypodensity in the left anterior, superior cerebellum and a cerebellar stroke was confirmed with an MRI showing acute left cerebellar/SCA territory nonhemorrhagic infarct and moderate chronic small vessel ischemic disease and old bilateral small cerebellar infarcts. Neurology was consulted, patient was outside of the tPA window and admitted for further stroke work up.   Assessment & Plan:   Principal Problem:   CVA (cerebral vascular accident) [facility]) Active Problems:   Essential hypertension   Hyperlipidemia   DM (diabetes mellitus), type 2 with renal complications ([place])  CVA - Acute left cerebellar nonhemorrhagic infarction (per MRI 4/29) - On 4/29, patient with a history of HTN, HLD, T2DM, CKD stage II, tobacco abuse and no history of known stroke presented to the ED complaining of dizziness and left lower extremity weakness and had similar symptoms for which she presented to the ED on 4/9 where she had a negative head CT.  - CT and MRI on 4/29 confirm acute left cerebellar/SCA territory nonhemorrhagic infarct and moderate chronic small vessel ischemic disease and old bilateral small cerebellar  infarcts. - CXR 4/30 negative. - Carotid doppler on 12/13/15 showed very mild plaque. Awaiting neurology's assessment today to determine if repeat US is needed at this time - ECHO pending, continue telemetry monitoring - CT-A head and neck ordered - TSH WNL 4.432. - Patient had been taking ASA daily and because she failed ASA therapy, she was switched to Plavix 75 mg upon admission and neurology agrees with plan.  - Meclizine prn for dizziness - Patient reports marked improvement in balance since her admission and can ambulate independently with a little remaining dizziness. Continue PT/OT therapy   HTN -  BP 162/53, HR 49.  - Per prior cardiology notes by Dr. Percival Spanish in 12/2015, patient has had orthostasis in the past.  - Retrieve orthostatic vitals. If negative, resume home medication Norvasc 2.5 mg. Hold Atenolol 50 mg today due to bradycardia. - Continue IV fluids 50 cc/hr - Monitor closely  HLD - Lipid panel 4/30 shows HDL 38, LDL 81, TG 217, VLDL 43.  - Patient increased from 20 mg to 40 mg Lipitor upon admission due to CVA despite statin therapy - Continue Lipitor 40 mg daily.   DM - Well controlled according to Hgb A1c 05/2016 at 6.3 - A1c ordered and result pending  - Continue SSI  Hyponatremia, hypochloremia - Na 133, Cl 100 - Continue IVF 50 cc/hr and monitor BMP closely   Depression - Continue Celexa (prolonged QT on EKG so monitor cautiously)  DVT prophylaxis: [place] Lovenox Code Status: FULL Family Communication: Son and granddaughter at bedside and agrees with plan of care discussed Disposition Plan: Home with PT  Consultants:   Neurology   Procedures:   ECHO 4/30  Antimicrobials:  None  Subjective: Patient reports marked improvement in her balance overnight and is now able to ambulate independently to the bathroom. She still reports mild dizziness. Weakness in her left leg has almost resolved and she reports no numbness. Denies difficulty breathing,  chest pain, fever/chills, headache, altered sensation.   Objective: Vitals:   07/29/16 0158 07/29/16 0200 07/29/16 0400 07/29/16 0600  BP:  111/73 (!) 150/48 (!) 162/53  Pulse:  (!) 46 (!) 45 (!) 49  Resp:  20 20 20   Temp:  97.7 F (36.5 C) 97.8 F (36.6 C) 97.6 F (36.4 C)  TempSrc:  Oral Oral Oral  SpO2: 96% 96% 96% 92%  Weight:  81.6 kg (180 lb)    Height:  5\' 6"  (1.676 m)      Intake/Output Summary (Last 24 hours) at 07/29/16 1008 Last data filed at 07/29/16 0919  Gross per 24 hour  Intake           633.33 ml  Output              100 ml  Net           533.33 ml   Filed Weights   07/29/16 0200  Weight: 81.6 kg (180 lb)    Examination:  General exam: Appears calm and comfortable  Respiratory system: Clear to auscultation. Respiratory effort normal. Cardiovascular system: S1 & S2 heard, RRR. No JVD, murmurs, rubs, gallops or clicks. No pedal edema. Gastrointestinal system: Abdomen is nondistended, soft and nontender.. Normal bowel sounds heard. Central nervous system: Alert and oriented. No focal neurological deficits. Extremities: 5/5 RLE strength and mild residual weakness of left lower extremity. Symmetrical 5/5 upper extremity strength  Skin: No rashes, lesions or ulcers Psychiatry: Judgement and insight appear normal. Mood & affect appropriate.   Data Reviewed: I have personally reviewed following labs and imaging studies  CBC:  Recent Labs Lab 07/28/16 1917  WBC 8.8  NEUTROABS 6.3  HGB 13.0  HCT 38.8  MCV 93.7  PLT 856   Basic Metabolic Panel:  Recent Labs Lab 07/28/16 1917  NA 133*  K 4.0  CL 100*  CO2 24  GLUCOSE 140*  BUN 15  CREATININE 1.01*  CALCIUM 9.4   GFR: Estimated Creatinine Clearance: 46.2 mL/min (A) (by C-G formula based on SCr of 1.01 mg/dL (H)). Liver Function Tests: No results for input(s): AST, ALT, ALKPHOS, BILITOT, PROT, ALBUMIN in the last 168 hours. No results for input(s): LIPASE, AMYLASE in the last 168 hours. No  results for input(s): AMMONIA in the last 168 hours. Coagulation Profile: No results for input(s): INR, PROTIME in the last 168 hours. Cardiac Enzymes: No results for input(s): CKTOTAL, CKMB, CKMBINDEX, TROPONINI in the last 168 hours. BNP (last 3 results) No results for input(s): PROBNP in the last 8760 hours. HbA1C: No results for input(s): HGBA1C in the last 72 hours. CBG:  Recent Labs Lab 07/29/16 0725  GLUCAP 116*   Lipid Profile:  Recent Labs  07/29/16 0414  CHOL 162  HDL 38*  LDLCALC 81  TRIG 217*  CHOLHDL 4.3   Thyroid Function Tests:  Recent Labs  07/29/16 0414  TSH 4.432   Anemia Panel: No results for input(s): VITAMINB12, FOLATE, FERRITIN, TIBC, IRON, RETICCTPCT in the last 72 hours. Sepsis Labs: No results for input(s): PROCALCITON, LATICACIDVEN in the last 168 hours.  No results found for this or any previous visit (from the past 240 hour(s)).  Radiology Studies: Dg Chest 2 View  Result Date: 07/29/2016  CLINICAL DATA:  Follow-up TIA EXAM: CHEST  2 VIEW COMPARISON:  02/24/2013 FINDINGS: Lungs are clear. Mild biapical pleural-parenchymal scarring. No pleural effusion or pneumothorax. The heart is normal in size. Visualized osseous structures are within normal limits. IMPRESSION: Normal chest radiographs. Electronically Signed   By: Julian Hy M.D.   On: 07/29/2016 07:29   Ct Head Wo Contrast  Result Date: 07/28/2016 CLINICAL DATA:  Left-sided weakness and dizziness left leg weakness EXAM: CT HEAD WITHOUT CONTRAST TECHNIQUE: Contiguous axial images were obtained from the base of the skull through the vertex without intravenous contrast. COMPARISON:  07/08/2016 FINDINGS: Brain: Focal low attenuation within the left anterior, superior cerebellum, suspicious for a focal area of edema. No hemorrhage or mass. Old lacune in the right basal ganglia. Moderate periventricular, subcortical and deep white matter small vessel ischemic changes. The ventricles are  stable in size. Mild cortical atrophy. Vascular: No hyperdense vessels. Carotid artery calcifications. Mild vertebral artery calcifications. Skull: No fracture or suspicious bone lesion Sinuses/Orbits: Osteoma right frontal sinus. Mild mucosal thickening in the ethmoid sinuses. Fluid level in the right sphenoid sinus. No acute orbital abnormality. Other: None IMPRESSION: 1. Focal hypodensity in the left anterior, superior cerebellum. This is suspicious for a focal area of edema/ischemia. MRI recommended for further evaluation. No hemorrhage. 2. Atrophy and chronic white matter small vessel ischemic changes 3. Fluid level in the sphenoid sinus with mucosal disease in the ethmoid sinuses. Electronically Signed   By: Donavan Foil M.D.   On: 07/28/2016 21:10   Mr Brain W And Wo Contrast  Result Date: 07/28/2016 CLINICAL DATA:  Vertigo, follow-up abnormal CT. Woke up today with dizziness and LEFT-sided weakness. History of diabetes, hypertension, memory loss. EXAM: MRI HEAD WITHOUT AND WITH CONTRAST TECHNIQUE: Multiplanar, multiecho pulse sequences of the brain and surrounding structures were obtained without and with intravenous contrast. CONTRAST:  5mL MULTIHANCE GADOBENATE DIMEGLUMINE 529 MG/ML IV SOLN COMPARISON:  CT HEAD July 28, 2016 at 2007 hours FINDINGS: Postcontrast sequences are mildly motion degraded. INTRACRANIAL CONTENTS: 21 x 18 mm area reduced diffusion LEFT superior cerebellum with low ADC values within superior cerebellar artery territory. No susceptibility artifact to suggest acute hemorrhage ; a few punctate chronic micro hemorrhages associated with hypertension. The ventricles and sulci are normal for patient's age. No suspicious parenchymal signal, masses, mass effect. Patchy to confluent supratentorial and pontine white matter FLAIR T2 hyperintensities. 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 No abnormal intraparenchymal or extra-axial enhancement. No abnormal extra-axial fluid collections.  No extra-axial masses. VASCULAR: Normal major intracranial vascular flow voids present at skull base. SKULL AND UPPER CERVICAL SPINE: No abnormal sellar expansion. No suspicious calvarial bone marrow signal. Craniocervical junction maintained. Grade 1 C3-4 anterolisthesis. SINUSES/ORBITS: Small RIGHT sphenoid sinus air-fluid level. Mastoid air cells are well aerated. The included ocular globes and orbital contents are non-suspicious. Status post bilateral ocular lens implants. OTHER: None. IMPRESSION: Acute LEFT cerebellar/SCA territory nonhemorrhagic infarct corresponding to CT abnormality. Moderate chronic small vessel ischemic disease and old bilateral small cerebellar infarcts. Electronically Signed   By: Elon Alas M.D.   On: 07/28/2016 23:24        Scheduled Meds: . atorvastatin  40 mg Oral q1800  . citalopram  5 mg Oral Daily  . clopidogrel  75 mg Oral Daily  . enoxaparin (LOVENOX) injection  40 mg Subcutaneous Daily  . insulin aspart  0-15 Units Subcutaneous TID WC  . iopamidol       Continuous Infusions: . sodium chloride 50 mL/hr at 07/29/16  0232     LOS: 0 days   Time spent: 30 minutes  Penny Hilburn, PA-Student   If 7PM-7AM, please contact night-coverage [email or web address] Password [email or web address] 07/29/2016, 10:08 AM

## 2016-07-29 NOTE — Progress Notes (Signed)
Nutrition Consult/Brief Note  RD consulted for CVA.  Wt Readings from Last 15 Encounters:  07/29/16 180 lb (81.6 kg)  07/08/16 170 lb (77.1 kg)  05/08/16 171 lb (77.6 kg)  05/07/16 169 lb 3.2 oz (76.7 kg)  12/07/15 170 lb 12.8 oz (77.5 kg)  11/01/15 170 lb (77.1 kg)  08/04/15 170 lb (77.1 kg)  06/27/15 172 lb (78 kg)  02/28/15 173 lb 3.2 oz (78.6 kg)  10/19/14 173 lb 3.2 oz (78.6 kg)  06/22/14 172 lb 6.4 oz (78.2 kg)  12/21/13 174 lb (78.9 kg)  11/08/13 170 lb (77.1 kg)  08/09/13 170 lb (77.1 kg)  06/15/13 174 lb 6.4 oz (79.1 kg)   Body mass index is 29.05 kg/m. Patient meets criteria for Overweight based on current BMI.   Current diet order is Heart Healthy/Carbohydrate Modified, patient is consuming approximately 95% of meals at this time.   Labs and medications reviewed.   Nutrition focused physical exam completed.  No muscle or subcutaneous fat depletion noticed.  No nutrition interventions warranted at this time. If nutrition issues arise, please consult RD.   Arthur Holms, RD, LDN Pager #: [phone] After-Hours Pager #: [phone]

## 2016-07-30 ENCOUNTER — Encounter (HOSPITAL_COMMUNITY)
Admission: EM | Disposition: A | Discharge status: Home / Self Care | Payer: Self-pay | Source: Home / Self Care | Attending: Nephrology

## 2016-07-30 ENCOUNTER — Encounter (HOSPITAL_COMMUNITY): Payer: Self-pay

## 2016-07-30 ENCOUNTER — Inpatient Hospital Stay (HOSPITAL_COMMUNITY): Payer: Medicare Other

## 2016-07-30 DIAGNOSIS — I6300 Cerebral infarction due to thrombosis of unspecified precerebral artery: Secondary | ICD-10-CM

## 2016-07-30 DIAGNOSIS — I340 Nonrheumatic mitral (valve) insufficiency: Secondary | ICD-10-CM

## 2016-07-30 HISTORY — PX: TEE WITHOUT CARDIOVERSION: SHX5443

## 2016-07-30 LAB — GLUCOSE, CAPILLARY
GLUCOSE-CAPILLARY: 104 mg/dL — AB (ref 65–99)
GLUCOSE-CAPILLARY: 121 mg/dL — AB (ref 65–99)
GLUCOSE-CAPILLARY: 104 mg/dL — AB (ref 65–99)
Glucose-Capillary: 133 mg/dL — ABNORMAL HIGH (ref 65–99)
Glucose-Capillary: 92 mg/dL (ref 65–99)
Glucose-Capillary: 147 mg/dL — ABNORMAL HIGH (ref 65–99)

## 2016-07-30 LAB — HEMOGLOBIN A1C
Hgb A1c MFr Bld: 6.8 % — ABNORMAL HIGH (ref 4.8–5.6)
MEAN PLASMA GLUCOSE: 148 mg/dL

## 2016-07-30 SURGERY — ECHOCARDIOGRAM, TRANSESOPHAGEAL
Anesthesia: Moderate Sedation

## 2016-07-30 MED ORDER — FENTANYL CITRATE (PF) 100 MCG/2ML IJ SOLN
INTRAMUSCULAR | Status: AC
  Filled 2016-07-30: qty 2

## 2016-07-30 MED ORDER — SODIUM CHLORIDE 0.9 % IV BOLUS (SEPSIS)
500.0000 mL | Freq: Once | INTRAVENOUS | Status: AC
  Administered 2016-07-30: 500 mL via INTRAVENOUS

## 2016-07-30 MED ORDER — MIDAZOLAM HCL 10 MG/2ML IJ SOLN
INTRAMUSCULAR | Status: DC | PRN
  Administered 2016-07-30: 1 mg via INTRAVENOUS
  Administered 2016-07-30: 2 mg via INTRAVENOUS

## 2016-07-30 MED ORDER — BUTAMBEN-TETRACAINE-BENZOCAINE 2-2-14 % EX AERO
INHALATION_SPRAY | CUTANEOUS | Status: DC | PRN
  Administered 2016-07-30: 2 via TOPICAL

## 2016-07-30 MED ORDER — FENTANYL CITRATE (PF) 100 MCG/2ML IJ SOLN
INTRAMUSCULAR | Status: DC | PRN
  Administered 2016-07-30 (×2): 12.5 ug via INTRAVENOUS

## 2016-07-30 MED ORDER — MIDAZOLAM HCL 5 MG/ML IJ SOLN
INTRAMUSCULAR | Status: AC
  Filled 2016-07-30: qty 2

## 2016-07-30 MED ORDER — SODIUM CHLORIDE 0.9 % IV SOLN: INTRAVENOUS | Status: DC

## 2016-07-30 NOTE — Consult Note (Signed)
[facility] CM Primary Care Navigator  07/30/2016  Penny Miller Jun 27, 1931 [id number]    Met with patient and family (son- Penny Miller and daughterStanton Miller) at the bedside to identify possible discharge needs. Patient reports having dizziness, weakness (more on left side) and inability to stand up that had led to this admission.  Patient endorses Dr. Jeanmarie Hubert (or Hollace Kinnier when Dr. Mallie Darting in June) with [facility] as the primary care provider.   Patient shared using [facility]) to obtain medications without any problem.   She reports managing her own medications at home but patient's son states that he will be assisting patient in managing medications using "pill box" system after discharge.   Patient mentioned that she was driving prior to admission, however, son or granddaughter Penny Miller) will be providing transportation to her doctors' appointments when discharged.  Son (works 10 hours/day- lives with patient) and granddaughter will be the primary caregivers temporarily while recovering as stated. Patient was provided with [facility] list of personal care services as a resourcewheneverneeded and emphasized the need topay out of the pocket for it.  Anticipated discharge plan is possibly home with son and granddaughter's assistance once clinically improved per son. Outpatient PT/ OT and 24 hour supervision per therapists recommendation.  Patient and son voiced understanding to call primary care provider's office when she returns home, for a post discharge follow-up appointment within a week or sooner if needed. Patient letter (with PCP's contact number) was provided as a reminder.  Explained to patient and family about [facility] CM services available for health management and patient verbally agreed and optedEMMI strokecalls to help with her recovery at home.  Referral was made for EMMI stroke calls after  discharge.   For questions, please contact:  [name], BSN, RN- [facility] Primary Care Navigator  Telephone: [phone] [place]

## 2016-07-30 NOTE — Progress Notes (Signed)
RN called to room by patient. Patient states she feels dizzy, nauseated, and states her left foot feels weak and is tingling. VSS, CBG 133. Patient returned to bed, laying flat, states her tingling in left foot, nausea, and dizziness is resolving. Burnetta Sabin, NP paged. Will continue to monitor patient.

## 2016-07-30 NOTE — Progress Notes (Addendum)
Physical Therapy Treatment Patient Details Name: Penny Miller MRN: [id number] DOB: March 15, 1932 Today's Date: 07/30/2016    History of Present Illness 83 yo female with h/o HTN, DM, polyneuropathy, memory loss, admitted with L cerebellar/ SCA territory infarct and slurred speech.     PT Comments    Pt generally progressing well toward goals.  Her L LE is less coordinated today with mildly more gait instability, but pt was able to work hard on gait with moderate balance challenges despite being generally tired and fatigued post procedure.   Follow Up Recommendations  Outpatient PT;Supervision for mobility/OOB     Equipment Recommendations  Rolling Walker with 5 inch fixed wheels    Recommendations for Other Services       Precautions / Restrictions Precautions Precautions: Fall    Mobility  Bed Mobility Overal bed mobility: Independent                Transfers Overall transfer level: Needs assistance Equipment used: 1 person hand held assist Transfers: Sit to/from Stand Sit to Stand: Min guard Stand pivot transfers: Min guard       General transfer comment: no assist needed,   Ambulation/Gait Ambulation/Gait assistance: Min guard;Min assist Ambulation Distance (Feet): 100 Feet (x2) Assistive device: None Gait Pattern/deviations: Step-through pattern Gait velocity: slower Gait velocity interpretation: Below normal speed for age/gender General Gait Details: today no AD used and pt showed more instability, more drifting, some steppage to recover and 1 list to the right of balance during an abrupt turn.  With distance pt's L LE fatigued and shuffled a bit.  Scanning worsened the stability, but pt did not lose balance.  Pt unable to back up without assist.   Stairs            Wheelchair Mobility    Modified Rankin (Stroke Patients Only) Modified Rankin (Stroke Patients Only) Pre-Morbid Rankin Score: No symptoms Modified Rankin: Moderately severe  disability     Balance Overall balance assessment: Needs assistance Sitting-balance support: No upper extremity supported;Feet supported Sitting balance-Leahy Scale: Good     Standing balance support: During functional activity Standing balance-Leahy Scale: Fair Standing balance comment: Pt is able to stand statically with min guard assist                            Cognition Arousal/Alertness: Awake/alert Behavior During Therapy: WFL for tasks assessed/performed Overall Cognitive Status: History of cognitive impairments - at baseline                                        Exercises      General Comments General comments (skin integrity, edema, etc.): discussed recommendations for 3in1 with pt's daughter and grand daughter       Pertinent Vitals/Pain Pain Assessment: No/denies pain    Home Living                      Prior Function            PT Goals (current goals can now be found in the care plan section) Acute Rehab PT Goals Patient Stated Goal: to return to yard work PT Goal Formulation: With patient Potential to Achieve Goals: Good Progress towards PT goals: Progressing toward goals    Frequency    Min 3X/week      PT Plan Current  plan remains appropriate    Co-evaluation              AM-PAC PT "6 Clicks" Daily Activity  Outcome Measure  Difficulty turning over in bed (including adjusting bedclothes, sheets and blankets)?: None Difficulty moving from lying on back to sitting on the side of the bed? : None Difficulty sitting down on and standing up from a chair with arms (e.g., wheelchair, bedside commode, etc,.)?: A Little Help needed moving to and from a bed to chair (including a wheelchair)?: A Little Help needed walking in hospital room?: A Little Help needed climbing 3-5 steps with a railing? : A Little 6 Click Score: 20    End of Session   Activity Tolerance: Patient tolerated treatment  well Patient left: in bed;with call bell/phone within reach;with bed alarm set;with family/visitor present Nurse Communication: Mobility status PT Visit Diagnosis: Unsteadiness on feet (R26.81);Other abnormalities of gait and mobility (R26.89);Dizziness and giddiness (R42)     Time: 8250-5397 PT Time Calculation (min) (ACUTE ONLY): 19 min  Charges:  $Gait Training: 8-22 mins                    G Codes:       08/26/2016  Donnella Sham, [place] [phone]  (pager)   Tessie Fass Limuel Nieblas 08-26-16, 4:23 PM

## 2016-07-30 NOTE — Progress Notes (Signed)
OT Cancellation Note  Patient Details Name: SAYRE MAZOR MRN: [id number] DOB: 1932/03/27   Cancelled Treatment:    Reason Eval/Treat Not Completed: Patient at procedure or test/ unavailable.  Will reattempt.  [place], OTR/L [phone]   Lucille Passy M 07/30/2016, 2:13 PM

## 2016-07-30 NOTE — Progress Notes (Signed)
Occupational Therapy Evaluation late entry for 07/29/16 (note note populated)  Pt admitted with the below listed diagnosis and deficits.  She will benefit from continued acute OT to allow her to maximize safety and independence with ADLs.   07/29/16 1200  OT Visit Information  Last OT Received On 07/29/16  Assistance Needed +1  History of Present Illness 83 yo female admitted with L cerebellar/ SCA territory infarct and slurred speech. PMH:  Precautions  Precautions Fall  Home Living  Family/patient expects to be discharged to: Private residence  Living Arrangements Children  Available Help at Discharge Family  Type of [place] to enter  Entrance Stairs-Number of Steps 5  Entrance Stairs-Rails (rail but loose and unsteady)  Home Layout One level  Bathroom Shower/Tub Tub/shower unit;Door  [employer] None  Additional Comments baseline enjoys being outside and mowing the grass. pt manages her own medications and bills. pt writes all her information in a notebook but currently no family members check behind her to ensure no errors are being made  Prior Function  Level of Independence Independent  Communication  Communication HOH  Pain Assessment  Pain Assessment No/denies pain  Cognition  Arousal/Alertness Awake/alert  Behavior During Therapy WFL for tasks assessed/performed  Overall Cognitive Status History of cognitive impairments - at baseline  General Comments family reports that she has trouble with recall of names sometimes but otherwise no deficits noted in functional task.   Upper Extremity Assessment  Upper Extremity Assessment LUE deficits/detail  LUE Deficits / Details 4 out 5 MMT   Lower Extremity Assessment  Lower Extremity Assessment Defer to PT evaluation  Cervical / Trunk Assessment  Cervical / Trunk Assessment Normal  ADL  Overall ADL's  Needs assistance/impaired  Eating/Feeding Set up  Eating/Feeding Details  (indicate cue type and reason) pt reports poor po intake at breakfast due to dislike for the food. Educated on calling to order specialized request available on menu  Grooming Wash/dry face;Oral care;Wash/dry hands;Min guard;Standing  Grooming Details (indicate cue type and reason) pt able to complete adl task at sink and scan surface for all items needed without cues  Lower Body Dressing Min guard;Sit to/from stand  Lower Body Dressing Details (indicate cue type and reason) pt able to manage gown for toileting and able to pull up bil socks for transfer  Toilet Transfer Min guard  Toileting- Clothing Manipulation and Hygiene Supervision/safety  Toileting - Clothing Manipulation Details (indicate cue type and reason) sitting with lateral lean  Functional mobility during ADLs Minimal assistance  General ADL Comments hand held (A) for transfers due to unsteadiness  Vision- History  Patient Visual Report Blurring of vision  Vision- Assessment  Vision Assessment? Yes  Eye Alignment [facility]  Ocular Range of Motion Impaired-to be further tested in functional context  Alignment/Gaze Preference WDL  Tracking/Visual Pursuits Impaired - to be further tested in functional context;Decreased smoothness of horizontal tracking  Additional Comments pt noted to have frog jumping to horizontal scanning. pt especially demonstrates L to R tracking. pt reports vision "the kind that makes you feel sick" "i feel like i am on a boat"  Bed Mobility  Overal bed mobility Independent  Transfers  Overall transfer level Needs assistance  Equipment used 1 person hand held assist  Transfers Sit to/from Stand  Sit to Stand Min assist  General transfer comment pt with hand held (A) for steadiness. pt with repeated attempts in session becoming less (A) from therapist.  Educated on gaze stabilization with improved balance  Balance  Overall balance assessment Needs assistance  Sitting-balance support No upper extremity  supported;Feet supported  Sitting balance-Leahy Scale Normal  Standing balance support During functional activity;No upper extremity supported  Standing balance-Leahy Scale Fair  OT - End of Session  Equipment Utilized During Treatment Gait belt  Activity Tolerance Patient tolerated treatment well  Patient left in chair;with call bell/phone within reach;with chair alarm set;with family/visitor present  Nurse Communication Mobility status;Precautions  OT Assessment  OT Recommendation/Assessment Patient needs continued OT Services  OT Visit Diagnosis Unsteadiness on feet (R26.81)  OT Problem List Decreased strength;Decreased activity tolerance;Impaired balance (sitting and/or standing);Decreased knowledge of use of DME or AE;Decreased safety awareness;Decreased cognition;Decreased knowledge of precautions;Impaired vision/perception  OT Plan  OT Frequency (ACUTE ONLY) Min 2X/week  OT Treatment/Interventions (ACUTE ONLY) Self-care/ADL training;Therapeutic exercise;Neuromuscular education;DME and/or AE instruction;Therapeutic activities;Cognitive remediation/compensation;Visual/perceptual remediation/compensation;Balance training;Patient/family education  AM-PAC OT "6 Clicks" Daily Activity Outcome Measure  Help from another person eating meals? 4  Help from another person taking care of personal grooming? 3  Help from another person toileting, which includes using toliet, bedpan, or urinal? 3  Help from another person bathing (including washing, rinsing, drying)? 3  Help from another person to put on and taking off regular upper body clothing? 3  Help from another person to put on and taking off regular lower body clothing? 3  6 Click Score 19  ADL G Code Conversion CK  OT Recommendation  Follow Up Recommendations Outpatient OT  OT Equipment Tub/shower seat  Individuals Consulted  Consulted and Agree with Results and Recommendations Patient;Family member/caregiver  Family Member Consulted  granddaughter tiffany and son billy  Acute Rehab OT Goals  Patient Stated Goal to return to yard work  OT Goal Formulation With patient/family  Time For Goal Achievement 08/12/16  Potential to Achieve Goals Good  OT Time Calculation  OT Start Time (ACUTE ONLY) 0850  OT Stop Time (ACUTE ONLY) 0916  OT Time Calculation (min) 26 min  OT General Charges  $OT Visit 1 Procedure  OT Evaluation  $OT Eval Moderate Complexity 1 Procedure  Written Expression  Dominant Hand Right  Loye Reininger, OTR/L (for Debroah Baller, OTR/L) [phone]

## 2016-07-30 NOTE — Progress Notes (Signed)
PROGRESS NOTE    [name]  EXN:[id number] DOB: October 09, 1931 DOA: 07/28/2016 PCP: Jeanmarie Hubert, MD    Brief Narrative: Penny Miller is an 83 year old female with a medical history significant for hypertension, chronic kidney disease stage 2, hyperlipidemia on statins and ASA, type 2 diabetes, dementia, and tobacco abuse who presented to ED on 4/29 with left lower extremity weakness and dizziness. MRI confirmed left cerebellar stroke. Patient was outside of tPA window as determined by neurology and admitted for further stroke work up.  Assessment & Plan:   Principal Problem:   CVA (cerebral vascular accident) ([place]) Active Problems:   Essential hypertension   Hyperlipidemia   DM (diabetes mellitus), type 2 with renal complications ([place])  CVA - Acute left cerebellar nonhemorrhagic infarction (per MRI 4/29) - On 4/29, patient presented to ED with left lower leg weakness and dizziness. - MRI confirmed acute left cerebellar/SCA territory nonhemorrhagic infarc. - Negative CXR. TSH WNL. 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 Likely anerysm will need to be monitored as outpatient with neurology  - Continue Plavix 75 mg after failed ASA therapy  - Continue meclizine prn for dizziness - Continue PT/OT therapy  - Patient had recurrent episode of left sided leg weakness and dizziness/nausea upon bending down this morning and had night sweats.  - Awaiting TEE results to assess for a cardiac stroke etiology  HTN -  BP 166/66, HR 55. Permissive hypertension allowed per neurology - Orthostatic vitals here negative. - Continue Norvasc 2.5 mg and continue holding Atenolol 50 mg due to bradycardia. - Continue IV fluids 50 cc/hr - Monitor closely  HLD - Lipid panel 4/30 shows elevated TG 217 - Continue Lipitor 40 mg daily  DM - A1c 6.8 now.  - Continue  SSI  Hyponatremia, hypochloremia - Na 133, Cl 100 - Continue IVF 50 cc/hr and monitor BMP   Depression - Continue Celexa   DVT prophylaxis: [place] Lovenox Code Status: Full Family Communication: granddaughter at bedside and agrees with plan of care discussed Disposition Plan: Home  Consultants:   Neurology  Procedures:   TEE 5/1  Antimicrobials:  none  Subjective: Patient states she had another dizzy spell and sudden left lower leg weakness and numbness early this morning that resolved on its own. Denies any chest pain, shortness of breath, confusion, headache, fever/chills.  Objective: Vitals:   07/30/16 0116 07/30/16 0500 07/30/16 0814 07/30/16 1017  BP: (!) 149/58 (!) 164/60 (!) 197/65 (!) 166/66  Pulse: (!) 58 (!) 54 62 (!) 55  Resp: 20 20 16 16   Temp: 98.9 F (37.2 C) 97.8 F (36.6 C) 97.9 F (36.6 C) 97.9 F (36.6 C)  TempSrc: Oral Oral Oral Oral  SpO2: 94% 95% 98% 99%  Weight:      Height:        Intake/Output Summary (Last 24 hours) at 07/30/16 1107 Last data filed at 07/30/16 0200  Gross per 24 hour  Intake             1140 ml  Output                0 ml  Net             1140 ml   Filed Weights   07/29/16 0200  Weight: 81.6 kg (180 lb)    Examination:  General exam: Appears calm and comfortable  Respiratory system: Clear to auscultation. Respiratory effort normal. Cardiovascular system: S1 & S2 heard, bradycardic. No JVD, murmurs, rubs, gallops or clicks. No pedal edema. Gastrointestinal system: Abdomen is nondistended, soft and nontender. Normal bowel sounds heard. Central nervous system: Alert and oriented. No focal neurological deficits. Extremities: Diminished strength in left lower extremity. Skin: No rashes, lesions or ulcers Psychiatry: Judgement and insight appear normal. Mood & affect appropriate.   Data Reviewed: I have personally reviewed following labs and imaging studies  CBC:  Recent Labs Lab 07/28/16 1917  WBC 8.8   NEUTROABS 6.3  HGB 13.0  HCT 38.8  MCV 93.7  PLT 703   Basic Metabolic Panel:  Recent Labs Lab 07/28/16 1917  NA 133*  K 4.0  CL 100*  CO2 24  GLUCOSE 140*  BUN 15  CREATININE 1.01*  CALCIUM 9.4   GFR: Estimated Creatinine Clearance: 46.2 mL/min (A) (by C-G formula based on SCr of 1.01 mg/dL (H)). Liver Function Tests: No results for input(s): AST, ALT, ALKPHOS, BILITOT, PROT, ALBUMIN in the last 168 hours. No results for input(s): LIPASE, AMYLASE in the last 168 hours. No results for input(s): AMMONIA in the last 168 hours. Coagulation Profile: No results for input(s): INR, PROTIME in the last 168 hours. Cardiac Enzymes: No results for input(s): CKTOTAL, CKMB, CKMBINDEX, TROPONINI in the last 168 hours. BNP (last 3 results) No results for input(s): PROBNP in the last 8760 hours. HbA1C:  Recent Labs  07/29/16 0412  HGBA1C 6.8*   CBG:  Recent Labs Lab 07/29/16 1742 07/29/16 2036 07/30/16 0115 07/30/16 0502 07/30/16 0813  GLUCAP 86 194* 104* 121* 133*   Lipid Profile:  Recent Labs  07/29/16 0414  CHOL 162  HDL 38*  LDLCALC 81  TRIG 217*  CHOLHDL 4.3   Thyroid Function Tests:  Recent Labs  07/29/16 0414  TSH 4.432   Anemia Panel: No results for input(s): VITAMINB12, FOLATE, FERRITIN, TIBC, IRON, RETICCTPCT in the last 72 hours. Sepsis Labs: No results for input(s): PROCALCITON, LATICACIDVEN in the last 168 hours.  No results found for this or any previous visit (from the past 240 hour(s)).       Radiology Studies: Ct Angio Head W Or Wo Contrast  Result Date: 07/29/2016 CLINICAL DATA:  83 y/o  F; embolic stroke. EXAM: CT ANGIOGRAPHY HEAD AND NECK TECHNIQUE: Multidetector CT imaging of the head and neck was performed using the standard protocol during bolus administration of intravenous contrast. Multiplanar CT image reconstructions and MIPs were obtained to evaluate the vascular anatomy. Carotid stenosis measurements (when applicable)  are obtained utilizing NASCET criteria, using the distal internal carotid diameter as the denominator. CONTRAST:  50 cc Isovue 370 COMPARISON:  07/28/2016 MRI of the brain and CT of the head. FINDINGS: CT HEAD FINDINGS Brain: Focus of hypoattenuation within the left superior cerebellar hemisphere is stable in compatible with infarction. No evidence for new large territory infarct, significant mass effect, or acute intracranial hemorrhage. Stable chronic microvascular ischemic changes of white matter and parenchymal volume loss of the brain. No hydrocephalus or extra-axial collection. Vascular: As below. Skull: Normal. Negative for fracture or focal lesion. Sinuses: 7 mm right frontal sinus osteoma. Small fluid level within the sphenoid sinuses. Orbits: Bilateral intra-ocular lens replacement. Review of the MIP images confirms the above findings CTA NECK FINDINGS Aortic arch: Standard branching. Imaged portion shows no evidence of aneurysm or dissection. No significant stenosis of the major arch vessel origins. Moderate calcific atherosclerosis of the aortic arch. Right carotid system: No  evidence of dissection, stenosis (50% or greater) or occlusion. Mild calcific atherosclerosis of the carotid bifurcation without stenosis. Tortuosity and kinking of the mid cervical internal carotid artery. Left carotid system: No evidence of dissection, stenosis (50% or greater) or occlusion.Mild calcific atherosclerosis of the carotid bifurcation without stenosis. Tortuosity and kinking of the mid cervical internal carotid artery. Vertebral arteries: Left vertebral artery 2 mm superiorly directed outpouching at the C1 level (series 13, image 135) compatible with a small aneurysm. Skeleton: Mild cervical spondylosis with disc and facet degenerative changes greatest at the C4 through C7 levels. No high-grade bony canal stenosis. Other neck: Negative. Upper chest: Few scattered peripheral punctate nodules and mucous plugging of small  airway is compatible with minimal bronchiolitis. Review of the MIP images confirms the above findings CTA HEAD FINDINGS Anterior circulation: No significant stenosis, proximal occlusion, aneurysm, or vascular malformation. Mild non stenotic calcific atherosclerosis of cavernous and paraclinoid internal carotid arteries. Posterior circulation: No significant stenosis, proximal occlusion, aneurysm, or vascular malformation. Venous sinuses: As permitted by contrast timing, patent. Anatomic variants: No bilateral fetal PCA with large posterior communicating artery is, diminutive right P1 segment, and no appreciable left P1 segment. Pole small anterior communicating artery. Delayed phase: No abnormal intracranial enhancement. Review of the MIP images confirms the above findings IMPRESSION: 1. Patent carotid and vertebral arteries. No large vessel occlusion of circle of Willis. 2. 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 3. Mild calcific atherosclerosis of carotid bifurcations and of carotid siphons without stenosis. 4. Left superior cerebellar hemisphere area of hypoattenuation compatible with infarction that is stable in comparison with prior studies. 5. No evidence for new large acute infarct, significant mass effect, or acute intracranial hemorrhage. Electronically Signed   By: Kristine Garbe M.D.   On: 07/29/2016 17:36   Dg Chest 2 View  Result Date: 07/29/2016 CLINICAL DATA:  Follow-up TIA EXAM: CHEST  2 VIEW COMPARISON:  02/24/2013 FINDINGS: Lungs are clear. Mild biapical pleural-parenchymal scarring. No pleural effusion or pneumothorax. The heart is normal in size. Visualized osseous structures are within normal limits. IMPRESSION: Normal chest radiographs. Electronically Signed   By: Julian Hy M.D.   On: 07/29/2016 07:29   Ct Head Wo Contrast  Result Date: 07/28/2016 CLINICAL DATA:  Left-sided weakness and dizziness left leg weakness EXAM: CT HEAD  WITHOUT CONTRAST TECHNIQUE: Contiguous axial images were obtained from the base of the skull through the vertex without intravenous contrast. COMPARISON:  07/08/2016 FINDINGS: Brain: Focal low attenuation within the left anterior, superior cerebellum, suspicious for a focal area of edema. No hemorrhage or mass. Old lacune in the right basal ganglia. Moderate periventricular, subcortical and deep white matter small vessel ischemic changes. The ventricles are stable in size. Mild cortical atrophy. Vascular: No hyperdense vessels. Carotid artery calcifications. Mild vertebral artery calcifications. Skull: No fracture or suspicious bone lesion Sinuses/Orbits: Osteoma right frontal sinus. Mild mucosal thickening in the ethmoid sinuses. Fluid level in the right sphenoid sinus. No acute orbital abnormality. Other: None IMPRESSION: 1. Focal hypodensity in the left anterior, superior cerebellum. This is suspicious for a focal area of edema/ischemia. MRI recommended for further evaluation. No hemorrhage. 2. Atrophy and chronic white matter small vessel ischemic changes 3. Fluid level in the sphenoid sinus with mucosal disease in the ethmoid sinuses. Electronically Signed   By: Donavan Foil M.D.   On: 07/28/2016 21:10   Ct Angio Neck W Or Wo Contrast  Result Date: 07/29/2016 CLINICAL DATA:  83 y/o  F; embolic stroke. EXAM: CT ANGIOGRAPHY HEAD AND NECK TECHNIQUE: Multidetector CT imaging of the head and neck was performed using the standard protocol during bolus administration of intravenous contrast. Multiplanar CT image reconstructions and MIPs were obtained to evaluate the vascular anatomy. Carotid stenosis measurements (when applicable) are obtained utilizing NASCET criteria, using the distal internal carotid diameter as the denominator. CONTRAST:  50 cc Isovue 370 COMPARISON:  07/28/2016 MRI of the brain and CT of the head. FINDINGS: CT HEAD FINDINGS Brain: Focus of hypoattenuation within the left superior cerebellar  hemisphere is stable in compatible with infarction. No evidence for new large territory infarct, significant mass effect, or acute intracranial hemorrhage. Stable chronic microvascular ischemic changes of white matter and parenchymal volume loss of the brain. No hydrocephalus or extra-axial collection. Vascular: As below. Skull: Normal. Negative for fracture or focal lesion. Sinuses: 7 mm right frontal sinus osteoma. Small fluid level within the sphenoid sinuses. Orbits: Bilateral intra-ocular lens replacement. Review of the MIP images confirms the above findings CTA NECK FINDINGS Aortic arch: Standard branching. Imaged portion shows no evidence of aneurysm or dissection. No significant stenosis of the major arch vessel origins. Moderate calcific atherosclerosis of the aortic arch. Right carotid system: No evidence of dissection, stenosis (50% or greater) or occlusion. Mild calcific atherosclerosis of the carotid bifurcation without stenosis. Tortuosity and kinking of the mid cervical internal carotid artery. Left carotid system: No evidence of dissection, stenosis (50% or greater) or occlusion.Mild calcific atherosclerosis of the carotid bifurcation without stenosis. Tortuosity and kinking of the mid cervical internal carotid artery. Vertebral arteries: Left vertebral artery 2 mm superiorly directed outpouching at the C1 level (series 13, image 135) compatible with a small aneurysm. Skeleton: Mild cervical spondylosis with disc and facet degenerative changes greatest at the C4 through C7 levels. No high-grade bony canal stenosis. Other neck: Negative. Upper chest: Few scattered peripheral punctate nodules and mucous plugging of small airway is compatible with minimal bronchiolitis. Review of the MIP images confirms the above findings CTA HEAD FINDINGS Anterior circulation: No significant stenosis, proximal occlusion, aneurysm, or vascular malformation. Mild non stenotic calcific atherosclerosis of cavernous and  paraclinoid internal carotid arteries. Posterior circulation: No significant stenosis, proximal occlusion, aneurysm, or vascular malformation. Venous sinuses: As permitted by contrast timing, patent. Anatomic variants: No bilateral fetal PCA with large posterior communicating artery is, diminutive right P1 segment, and no appreciable left P1 segment. Pole small anterior communicating artery. Delayed phase: No abnormal intracranial enhancement. Review of the MIP images confirms the above findings IMPRESSION: 1. Patent carotid and vertebral arteries. No large vessel occlusion of circle of Willis. 2. 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 3. Mild calcific atherosclerosis of carotid bifurcations and of carotid siphons without stenosis. 4. Left superior cerebellar hemisphere area of hypoattenuation compatible with infarction that is stable in comparison with prior studies. 5. No evidence for new large acute infarct, significant mass effect, or acute intracranial hemorrhage. Electronically Signed   By: Kristine Garbe M.D.   On: 07/29/2016 17:36   Mr Jeri Cos And Wo Contrast  Result Date: 07/28/2016 CLINICAL DATA:  Vertigo, follow-up abnormal CT. Woke up today with dizziness and LEFT-sided weakness. History of diabetes, hypertension, memory loss. EXAM: MRI HEAD WITHOUT AND WITH CONTRAST TECHNIQUE: Multiplanar, multiecho pulse sequences of the brain and surrounding structures were obtained without and with intravenous contrast. CONTRAST:  8mL MULTIHANCE GADOBENATE DIMEGLUMINE 529 MG/ML IV SOLN COMPARISON:  CT HEAD July 28, 2016 at 2007 hours  FINDINGS: Postcontrast sequences are mildly motion degraded. INTRACRANIAL CONTENTS: 21 x 18 mm area reduced diffusion LEFT superior cerebellum with low ADC values within superior cerebellar artery territory. No susceptibility artifact to suggest acute hemorrhage ; a few punctate chronic micro hemorrhages associated with hypertension.  The ventricles and sulci are normal for patient's age. No suspicious parenchymal signal, masses, mass effect. Patchy to confluent supratentorial and pontine white matter FLAIR T2 hyperintensities. 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 No abnormal intraparenchymal or extra-axial enhancement. No abnormal extra-axial fluid collections. No extra-axial masses. VASCULAR: Normal major intracranial vascular flow voids present at skull base. SKULL AND UPPER CERVICAL SPINE: No abnormal sellar expansion. No suspicious calvarial bone marrow signal. Craniocervical junction maintained. Grade 1 C3-4 anterolisthesis. SINUSES/ORBITS: Small RIGHT sphenoid sinus air-fluid level. Mastoid air cells are well aerated. The included ocular globes and orbital contents are non-suspicious. Status post bilateral ocular lens implants. OTHER: None. IMPRESSION: Acute LEFT cerebellar/SCA territory nonhemorrhagic infarct corresponding to CT abnormality. Moderate chronic small vessel ischemic disease and old bilateral small cerebellar infarcts. Electronically Signed   By: Elon Alas M.D.   On: 07/28/2016 23:24        Scheduled Meds: . amLODipine  2.5 mg Oral Daily  . atorvastatin  40 mg Oral q1800  . citalopram  5 mg Oral Daily  . clopidogrel  75 mg Oral Daily  . enoxaparin (LOVENOX) injection  40 mg Subcutaneous Daily  . insulin aspart  0-15 Units Subcutaneous TID WC   Continuous Infusions: . sodium chloride 1,000 mL (07/29/16 2337)     LOS: 1 day    Time spent: 30 minutes    Joleen Stuckert, PA-Student   If 7PM-7AM, please contact night-coverage [email or web address] Password [email or web address] 07/30/2016, 11:07 AM

## 2016-07-30 NOTE — Progress Notes (Signed)
PT Cancellation Note  Patient Details Name: SUHAYLA CHISOM MRN: [id number] DOB: Jan 23, 1932   Cancelled Treatment:    Reason Eval/Treat Not Completed: Patient at procedure or test/unavailable.  Heading to TEE as I arrived, will see later as able. 07/30/2016  Donnella Sham, [place] [phone]  (pager)   Tessie Fass Cameron [place] 07/30/2016, 1:16 PM

## 2016-07-30 NOTE — Progress Notes (Signed)
STROKE TEAM PROGRESS NOTE   SUBJECTIVE (INTERVAL HISTORY) Patient  had transient worsening of dizziness and left leg tingling earlier this morning when she first stood up out of bed. She was asked to lay flat and given IV fluids and has improved and is feeling better.  OBJECTIVE Temp:  [97.8 F (36.6 C)-98.9 F (37.2 C)] 97.9 F (36.6 C) (05/01 1017) Pulse Rate:  [46-62] 46 (05/01 1445) Cardiac Rhythm: Heart block (05/01 0700) Resp:  [12-20] 12 (05/01 1445) BP: (122-208)/(43-94) 122/43 (05/01 1445) SpO2:  [93 %-100 %] 93 % (05/01 1445)  CBC:   Recent Labs Lab 07/28/16 1917  WBC 8.8  NEUTROABS 6.3  HGB 13.0  HCT 38.8  MCV 93.7  PLT 542    Basic Metabolic Panel:   Recent Labs Lab 07/28/16 1917  NA 133*  K 4.0  CL 100*  CO2 24  GLUCOSE 140*  BUN 15  CREATININE 1.01*  CALCIUM 9.4   HgbA1c:  Lab Results  Component Value Date   HGBA1C 6.8 (H) 07/29/2016   CT Angio brain and neck 07/29/2016 : 1. Patent carotid and vertebral arteries. No large vessel occlusion of circle of Willis. 2. 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 3. Mild calcific atherosclerosis of carotid bifurcations and of carotid siphons without stenosis.  PHYSICAL EXAM Pleasant elderly Caucasian lady not in distress. . Afebrile. Head is nontraumatic. Neck is supple without bruit.    Cardiac exam no murmur or gallop. Lungs are clear to auscultation. Distal pulses are well felt. Neurological Exam :  Awake alert oriented 3 with normal speech and language function. No aphasia, dysarthria or apraxia. She follows commands well. Intact attention, registration and recall. Fundi were not visualized. Extraocular moments are full range without nystagmus. Slight impairment of psychiatric I moments to the left. Face is symmetric without weakness. Tongue is midline. Motor system exam reveals no upper or lower eczema to drift. Slight impairment of finger-to-nose and knee to heel  coordination on the left. Mild giveaway weakness of the left grip compared to the right. Deep tendon reflexes are 2+ symmetric. Plantars are downgoing. Sensation is intact. Gait was not tested. ASSESSMENT/PLAN Penny Miller is a 83 y.o. female with history of memory loss, HTN, HLD, diabetes presenting with severe vertigo, nausea, dry heaves, slurred speech and unsteady on her feet with L arm and leg weakness. She did not receive IV t-PA due to delay in arrival.   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 small vessel disease. Old B small cerebellar infarcts.   CTA head and neck pending  2D Echo  Pending  TEE and loop if about test are unyielding  LDL 81  HgbA1c pending  Lovenox 40 mg sq daily for VTE prophylaxis Diet Carb Modified Fluid consistency: Thin; Room service appropriate? 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  Hospital day # 1    Recommend  , transthoracic echo and if no definite etiology is found consider I discussed with patient that her small 2 mm vertebral artery aneurysm is asymptomatic unrelated to her stroke and needs conservative follow up  at this time and she agrees.TEE and loop recorder for paroxysmal atrial fibrillation. Continue Plavix for stroke prevention. Long discussion of the bedside with the patient and husband and answered questions. Discussed with Dr. Carolin Sicks. Greater than 50% time during this 25 minute visit was spent on counseling and coordination of care about embolic stroke, cerebral aneurysm discussion about evaluation and treatment plan and answering questions.  Penny Contras, MD Medical Director [facility] Pager: [phone] 07/30/2016 2:57 PM   To contact Stroke Continuity provider, please refer to [email or web address] After hours, contact General Neurology

## 2016-07-30 NOTE — Progress Notes (Signed)
Occupational Therapy Treatment Patient Details Name: Penny Miller MRN: [id number] DOB: [birth year] Today's Date: 07/30/2016    History of present illness 83 yo female admitted with L cerebellar/ SCA territory infarct and slurred speech. PMH:   OT comments  Pt requires min guard assist for ADLs, and min A for tub transfer due to ataxia.  Recommend 3in1 commode for home use, and 24 hour supervision.     Follow Up Recommendations  Outpatient OT    Equipment Recommendations  3 in 1 bedside commode    Recommendations for Other Services      Precautions / Restrictions Precautions Precautions: Fall       Mobility Bed Mobility Overal bed mobility: Independent                Transfers Overall transfer level: Needs assistance Equipment used: 1 person hand held assist Transfers: Sit to/from Stand;Stand Pivot Transfers Sit to Stand: Min guard Stand pivot transfers: Min guard            Balance Overall balance assessment: Needs assistance Sitting-balance support: No upper extremity supported;Feet supported       Standing balance support: During functional activity Standing balance-Leahy Scale: Fair Standing balance comment: Pt is able to stand statically with min guard assist                            ADL either performed or assessed with clinical judgement   ADL Overall ADL's : Needs assistance/impaired     Grooming: Wash/dry hands;Oral care;Wash/dry face;Brushing hair;Min guard;Standing                   Toilet Transfer: Min guard;Ambulation;Comfort height toilet;Grab bars   Toileting- Clothing Manipulation and Hygiene: Min guard;Sit to/from stand   Tub/ Shower Transfer: Minimal assistance;3 in 1 Tub/Shower Transfer Details (indicate cue type and reason): discussed need for seat in shower - discussed options.   Functional mobility during ADLs: Min guard General ADL Comments: Pt requires min guard for balance and safety      Vision    Vision Assessment?: Yes Eye Alignment: Within Functional Limits Ocular Range of Motion: Within Functional Limits Alignment/Gaze Preference: Within Defined Limits Tracking/Visual Pursuits: Decreased smoothness of horizontal tracking Saccades: Within functional limits Additional Comments: Pt with mild horizontal nystagmus with Rt gaze.   She denies dizziness today    Perception     Praxis      Cognition Arousal/Alertness: Awake/alert Behavior During Therapy: WFL for tasks assessed/performed Overall Cognitive Status: History of cognitive impairments - at baseline                                          Exercises     Shoulder Instructions       General Comments discussed recommendations for 3in1 with pt's daughter and grand daughter     Pertinent Vitals/ Pain       Pain Assessment: No/denies pain  Home Living                                          Prior Functioning/Environment              Frequency  Min 2X/week        Progress Toward Goals  OT Goals(current goals can now be found in the care plan section)  Progress towards OT goals: Progressing toward goals     Plan Discharge plan remains appropriate;Equipment recommendations need to be updated    Co-evaluation                 AM-PAC PT "6 Clicks" Daily Activity     Outcome Measure   Help from another person eating meals?: None Help from another person taking care of personal grooming?: A Little Help from another person toileting, which includes using toliet, bedpan, or urinal?: A Little Help from another person bathing (including washing, rinsing, drying)?: A Little Help from another person to put on and taking off regular upper body clothing?: A Little Help from another person to put on and taking off regular lower body clothing?: A Little 6 Click Score: 19    End of Session Equipment Utilized During Treatment: Gait belt  OT Visit Diagnosis:  Unsteadiness on feet (R26.81)   Activity Tolerance Patient tolerated treatment well   Patient Left Other (comment) (with pT )   Nurse Communication Mobility status        Time: 7014-1030 OT Time Calculation (min): 24 min  Charges: OT General Charges $OT Visit: 1 Procedure OT Treatments $Self Care/Home Management : 8-22 mins  [facility], OTR/L [phone]    Lucille Passy M 07/30/2016, 4:17 PM

## 2016-07-30 NOTE — CV Procedure (Signed)
TEE: During this procedure the patient is administered a total of Versed 3 mg and Fentanyl 25 mg to achieve and maintain moderate conscious sedation.  The patient's heart rate, blood pressure, and oxygen saturation are monitored continuously during the procedure. The period of conscious sedation is 30 minutes, of which I was present face-to-face 100% of this time.   Normal EF 65% No LAA thrombus Mild MR Normal AV Normal RV TV PV Moderate mural aortic debris Negative bubble for cardiac shunt   Penny Miller

## 2016-07-31 ENCOUNTER — Other Ambulatory Visit (HOSPITAL_COMMUNITY): Payer: Medicare Other

## 2016-07-31 ENCOUNTER — Encounter (HOSPITAL_COMMUNITY): Payer: Self-pay | Admitting: Cardiovascular Disease

## 2016-07-31 ENCOUNTER — Encounter (HOSPITAL_COMMUNITY)
Admission: EM | Disposition: A | Discharge status: Home / Self Care | Payer: Self-pay | Source: Home / Self Care | Attending: Nephrology

## 2016-07-31 HISTORY — PX: LOOP RECORDER INSERTION: EP1214

## 2016-07-31 LAB — GLUCOSE, CAPILLARY
GLUCOSE-CAPILLARY: 170 mg/dL — AB (ref 65–99)
Glucose-Capillary: 116 mg/dL — ABNORMAL HIGH (ref 65–99)
Glucose-Capillary: 96 mg/dL (ref 65–99)

## 2016-07-31 SURGERY — LOOP RECORDER INSERTION

## 2016-07-31 MED ORDER — LIDOCAINE HCL (PF) 1 % IJ SOLN
INTRAMUSCULAR | Status: DC | PRN
  Administered 2016-07-31: 5 mL

## 2016-07-31 MED ORDER — ATORVASTATIN CALCIUM 40 MG PO TABS: 40.0000 mg | ORAL_TABLET | Freq: Every day | ORAL | 2 refills | Status: DC

## 2016-07-31 MED ORDER — CLOPIDOGREL BISULFATE 75 MG PO TABS: 75.0000 mg | ORAL_TABLET | Freq: Every day | ORAL | 2 refills | Status: DC

## 2016-07-31 MED ORDER — LIDOCAINE-EPINEPHRINE 1 %-1:100000 IJ SOLN
INTRAMUSCULAR | Status: AC
  Filled 2016-07-31: qty 1

## 2016-07-31 SURGICAL SUPPLY — 2 items
LOOP REVEAL LINQSYS (Prosthesis & Implant Heart) ×2 IMPLANT
PACK LOOP INSERTION (CUSTOM PROCEDURE TRAY) ×3 IMPLANT

## 2016-07-31 NOTE — Progress Notes (Signed)
Occupational Therapy Treatment Patient Details Name: Penny Miller MRN: [id number] DOB: June 25, 1931 Today's Date: 07/31/2016    History of present illness 83 yo female with h/o HTN, DM, polyneuropathy, memory loss, admitted with L cerebellar/ SCA territory infarct and slurred speech.    OT comments  Pt improving.  She requires supervision for ADLs, but continues with ataxia and fatigues with activity.   She is eager to discharge. Recommend OPOT.  Follow Up Recommendations  Outpatient OT    Equipment Recommendations  3 in 1 bedside commode    Recommendations for Other Services      Precautions / Restrictions Precautions Precautions: Fall       Mobility Bed Mobility Overal bed mobility: Independent                Transfers Overall transfer level: Needs assistance Equipment used: None Transfers: Sit to/from Stand;Stand Pivot Transfers Sit to Stand: Supervision Stand pivot transfers: Supervision            Balance Overall balance assessment: Needs assistance Sitting-balance support: No upper extremity supported;Feet supported Sitting balance-Leahy Scale: Good     Standing balance support: During functional activity Standing balance-Leahy Scale: Fair                             ADL either performed or assessed with clinical judgement   ADL Overall ADL's : Needs assistance/impaired     Grooming: Wash/dry hands;Wash/dry face;Oral care;Brushing hair;Supervision/safety;Standing               Lower Body Dressing: Supervision/safety;Sit to/from stand   Toilet Transfer: Supervision/safety;Ambulation;Comfort height toilet;RW   Toileting- Clothing Manipulation and Hygiene: Supervision/safety;Sit to/from stand       Functional mobility during ADLs: Supervision/safety;Rolling walker       Vision   Additional Comments: Pt able to negotiate obstacles in hallway and read signage for path finding activity    Perception     Praxis       Cognition Arousal/Alertness: Awake/alert Behavior During Therapy: [facility] for tasks assessed/performed Overall Cognitive Status: History of cognitive impairments - at baseline                                 General Comments: family reports that she has trouble with recall of names sometimes but otherwise no deficits noted in functional task.         Exercises     Shoulder Instructions       General Comments reinforced risk of falls with pt and grand daughter.  [place] daughter will provide 24 hour supervision.      Pertinent Vitals/ Pain       Pain Assessment: No/denies pain  Home Living                                          Prior Functioning/Environment              Frequency  Min 2X/week        Progress Toward Goals  OT Goals(current goals can now be found in the care plan section)  Progress towards OT goals: Progressing toward goals     Plan Discharge plan remains appropriate    Co-evaluation  AM-PAC PT "6 Clicks" Daily Activity     Outcome Measure   Help from another person eating meals?: None Help from another person taking care of personal grooming?: A Little Help from another person toileting, which includes using toliet, bedpan, or urinal?: A Little Help from another person bathing (including washing, rinsing, drying)?: A Little Help from another person to put on and taking off regular upper body clothing?: A Little Help from another person to put on and taking off regular lower body clothing?: A Little 6 Click Score: 19    End of Session Equipment Utilized During Treatment: Gait belt  OT Visit Diagnosis: Unsteadiness on feet (R26.81)   Activity Tolerance Patient tolerated treatment well   Patient Left in bed;with call bell/phone within reach;with family/visitor present   Nurse Communication Mobility status        Time: 3212-2482 OT Time Calculation (min): 25 min  Charges: OT  General Charges $OT Visit: 1 Procedure OT Treatments $Therapeutic Activity: 23-37 mins  [facility], OTR/L [phone]    Lucille Passy M 07/31/2016, 3:11 PM

## 2016-07-31 NOTE — Progress Notes (Signed)
[facility] PROGRESS NOTE  Penny Miller STM:[id number] DOB: 05/14/1931 DOA: 07/28/2016  PCP: Jeanmarie Hubert, MD  Brief History/Interval Summary: 83 year old Caucasian female with a past medical history of hypertension, chronic kidney disease stage II, hyperlipidemia on statin, type 2 diabetes, dementia, presented with left lower extremity weakness and dizziness. MRI showed left cerebellar stroke. Patient was hospitalized. She was outside the TPA window as determined by neurology.  Reason for Visit: Acute stroke  Consultants: Neurology  Procedures:  TEE Study Conclusions  - Left ventricle: Systolic function was normal. The estimated   ejection fraction was in the range of 60% to 65%. Wall motion was   normal; there were no regional wall motion abnormalities. - Aortic valve: No evidence of vegetation. - Aorta: Moderate mural aortic debris. - Mitral valve: There was mild regurgitation. - Left atrium: The atrium was dilated. No evidence of thrombus in   the atrial cavity or appendage. No evidence of thrombus in the   atrial cavity or appendage. - Right atrium: No evidence of thrombus in the atrial cavity or   appendage. No evidence of thrombus in the atrial cavity or   appendage. - Atrial septum: No defect or patent foramen ovale was identified.  Impressions:  - No cardiac source of emboli was indentified.  Antibiotics: None  Subjective/Interval History: Patient feels better this morning. Has been ambulating to the bathroom without any difficulties. Has not had any further episodes of lightheadedness. No tingling or numbness.  ROS: No nausea or vomiting  Objective:  Vital Signs  Vitals:   07/30/16 1855 07/30/16 2132 07/31/16 0149 07/31/16 0400  BP: (!) 147/66 (!) 156/66 (!) 157/52 (!) 181/79  Pulse: (!) 57 (!) 58 (!) 53 (!) 56  Resp: 20 18 18 18   Temp: 98.1 F (36.7 C) 99.2 F (37.3 C) 97.7 F (36.5 C) 98.5 F (36.9 C)  TempSrc: Oral Oral Oral Oral    SpO2: 95% 94% 95% 96%  Weight:      Height:       No intake or output data in the 24 hours ending 07/31/16 1258 Filed Weights   07/29/16 0200  Weight: 81.6 kg (180 lb)    General appearance: alert, cooperative, appears stated age and no distress Head: Normocephalic, without obvious abnormality, atraumatic Cardio: regular rate and rhythm, S1, S2 normal, no murmur, click, rub or gallop GI: soft, non-tender; bowel sounds normal; no masses,  no organomegaly Extremities: extremities normal, atraumatic, no cyanosis or edema Neurologic: No obvious focal neurological deficits appreciated  Lab Results:  Data Reviewed: I have personally reviewed following labs and imaging studies  CBC:  Recent Labs Lab 07/28/16 1917  WBC 8.8  NEUTROABS 6.3  HGB 13.0  HCT 38.8  MCV 93.7  PLT 892    Basic Metabolic Panel:  Recent Labs Lab 07/28/16 1917  NA 133*  K 4.0  CL 100*  CO2 24  GLUCOSE 140*  BUN 15  CREATININE 1.01*  CALCIUM 9.4    GFR: Estimated Creatinine Clearance: 46.2 mL/min (A) (by C-G formula based on SCr of 1.01 mg/dL (H)).  HbA1C:  Recent Labs  07/29/16 0412  HGBA1C 6.8*    CBG:  Recent Labs Lab 07/30/16 1106 07/30/16 1627 07/30/16 2137 07/31/16 0612 07/31/16 1116  GLUCAP 92 104* 147* 116* 170*    Lipid Profile:  Recent Labs  07/29/16 0414  CHOL 162  HDL 38*  LDLCALC 81  TRIG 217*  CHOLHDL 4.3    Thyroid Function Tests:  Recent Labs  07/29/16 0414  TSH 4.432       Radiology Studies: Ct Angio Head W Or Wo Contrast  Result Date: 07/29/2016 CLINICAL DATA:  83 y/o  F; embolic stroke. EXAM: CT ANGIOGRAPHY HEAD AND NECK TECHNIQUE: Multidetector CT imaging of the head and neck was performed using the standard protocol during bolus administration of intravenous contrast. Multiplanar CT image reconstructions and MIPs were obtained to evaluate the vascular anatomy. Carotid stenosis measurements (when applicable) are obtained utilizing  NASCET criteria, using the distal internal carotid diameter as the denominator. CONTRAST:  50 cc Isovue 370 COMPARISON:  07/28/2016 MRI of the brain and CT of the head. FINDINGS: CT HEAD FINDINGS Brain: Focus of hypoattenuation within the left superior cerebellar hemisphere is stable in compatible with infarction. No evidence for new large territory infarct, significant mass effect, or acute intracranial hemorrhage. Stable chronic microvascular ischemic changes of white matter and parenchymal volume loss of the brain. No hydrocephalus or extra-axial collection. Vascular: As below. Skull: Normal. Negative for fracture or focal lesion. Sinuses: 7 mm right frontal sinus osteoma. Small fluid level within the sphenoid sinuses. Orbits: Bilateral intra-ocular lens replacement. Review of the MIP images confirms the above findings CTA NECK FINDINGS Aortic arch: Standard branching. Imaged portion shows no evidence of aneurysm or dissection. No significant stenosis of the major arch vessel origins. Moderate calcific atherosclerosis of the aortic arch. Right carotid system: No evidence of dissection, stenosis (50% or greater) or occlusion. Mild calcific atherosclerosis of the carotid bifurcation without stenosis. Tortuosity and kinking of the mid cervical internal carotid artery. Left carotid system: No evidence of dissection, stenosis (50% or greater) or occlusion.Mild calcific atherosclerosis of the carotid bifurcation without stenosis. Tortuosity and kinking of the mid cervical internal carotid artery. Vertebral arteries: Left vertebral artery 2 mm superiorly directed outpouching at the C1 level (series 13, image 135) compatible with a small aneurysm. Skeleton: Mild cervical spondylosis with disc and facet degenerative changes greatest at the C4 through C7 levels. No high-grade bony canal stenosis. Other neck: Negative. Upper chest: Few scattered peripheral punctate nodules and mucous plugging of small airway is compatible  with minimal bronchiolitis. Review of the MIP images confirms the above findings CTA HEAD FINDINGS Anterior circulation: No significant stenosis, proximal occlusion, aneurysm, or vascular malformation. Mild non stenotic calcific atherosclerosis of cavernous and paraclinoid internal carotid arteries. Posterior circulation: No significant stenosis, proximal occlusion, aneurysm, or vascular malformation. Venous sinuses: As permitted by contrast timing, patent. Anatomic variants: No bilateral fetal PCA with large posterior communicating artery is, diminutive right P1 segment, and no appreciable left P1 segment. Pole small anterior communicating artery. Delayed phase: No abnormal intracranial enhancement. Review of the MIP images confirms the above findings IMPRESSION: 1. Patent carotid and vertebral arteries. No large vessel occlusion of circle of Willis. 2. 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 3. Mild calcific atherosclerosis of carotid bifurcations and of carotid siphons without stenosis. 4. Left superior cerebellar hemisphere area of hypoattenuation compatible with infarction that is stable in comparison with prior studies. 5. No evidence for new large acute infarct, significant mass effect, or acute intracranial hemorrhage. Electronically Signed   By: Kristine Garbe M.D.   On: 07/29/2016 17:36   Ct Angio Neck W Or Wo Contrast  Result Date: 07/29/2016 CLINICAL DATA:  83 y/o  F; embolic stroke. EXAM: CT ANGIOGRAPHY HEAD AND NECK TECHNIQUE: Multidetector CT imaging of the head and neck was performed using the standard protocol during bolus administration of intravenous  contrast. Multiplanar CT image reconstructions and MIPs were obtained to evaluate the vascular anatomy. Carotid stenosis measurements (when applicable) are obtained utilizing NASCET criteria, using the distal internal carotid diameter as the denominator. CONTRAST:  50 cc Isovue 370 COMPARISON:   07/28/2016 MRI of the brain and CT of the head. FINDINGS: CT HEAD FINDINGS Brain: Focus of hypoattenuation within the left superior cerebellar hemisphere is stable in compatible with infarction. No evidence for new large territory infarct, significant mass effect, or acute intracranial hemorrhage. Stable chronic microvascular ischemic changes of white matter and parenchymal volume loss of the brain. No hydrocephalus or extra-axial collection. Vascular: As below. Skull: Normal. Negative for fracture or focal lesion. Sinuses: 7 mm right frontal sinus osteoma. Small fluid level within the sphenoid sinuses. Orbits: Bilateral intra-ocular lens replacement. Review of the MIP images confirms the above findings CTA NECK FINDINGS Aortic arch: Standard branching. Imaged portion shows no evidence of aneurysm or dissection. No significant stenosis of the major arch vessel origins. Moderate calcific atherosclerosis of the aortic arch. Right carotid system: No evidence of dissection, stenosis (50% or greater) or occlusion. Mild calcific atherosclerosis of the carotid bifurcation without stenosis. Tortuosity and kinking of the mid cervical internal carotid artery. Left carotid system: No evidence of dissection, stenosis (50% or greater) or occlusion.Mild calcific atherosclerosis of the carotid bifurcation without stenosis. Tortuosity and kinking of the mid cervical internal carotid artery. Vertebral arteries: Left vertebral artery 2 mm superiorly directed outpouching at the C1 level (series 13, image 135) compatible with a small aneurysm. Skeleton: Mild cervical spondylosis with disc and facet degenerative changes greatest at the C4 through C7 levels. No high-grade bony canal stenosis. Other neck: Negative. Upper chest: Few scattered peripheral punctate nodules and mucous plugging of small airway is compatible with minimal bronchiolitis. Review of the MIP images confirms the above findings CTA HEAD FINDINGS Anterior circulation:  No significant stenosis, proximal occlusion, aneurysm, or vascular malformation. Mild non stenotic calcific atherosclerosis of cavernous and paraclinoid internal carotid arteries. Posterior circulation: No significant stenosis, proximal occlusion, aneurysm, or vascular malformation. Venous sinuses: As permitted by contrast timing, patent. Anatomic variants: No bilateral fetal PCA with large posterior communicating artery is, diminutive right P1 segment, and no appreciable left P1 segment. Pole small anterior communicating artery. Delayed phase: No abnormal intracranial enhancement. Review of the MIP images confirms the above findings IMPRESSION: 1. Patent carotid and vertebral arteries. No large vessel occlusion of circle of Willis. 2. 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 3. Mild calcific atherosclerosis of carotid bifurcations and of carotid siphons without stenosis. 4. Left superior cerebellar hemisphere area of hypoattenuation compatible with infarction that is stable in comparison with prior studies. 5. No evidence for new large acute infarct, significant mass effect, or acute intracranial hemorrhage. Electronically Signed   By: Kristine Garbe M.D.   On: 07/29/2016 17:36     Medications:  Scheduled: . amLODipine  2.5 mg Oral Daily  . atorvastatin  40 mg Oral q1800  . citalopram  5 mg Oral Daily  . clopidogrel  75 mg Oral Daily  . enoxaparin (LOVENOX) injection  40 mg Subcutaneous Daily  . insulin aspart  0-15 Units Subcutaneous TID WC   Continuous: . sodium chloride 1,000 mL (07/29/16 2337)  . sodium chloride     EPP:IRJJOACZYSAYT **OR** acetaminophen (TYLENOL) oral liquid 160 mg/5 mL **OR** acetaminophen, meclizine, senna-docusate  Assessment/Plan:  Principal Problem:   CVA (cerebral vascular accident) [facility]) Active Problems:   Essential hypertension  Hyperlipidemia   DM (diabetes mellitus), type 2 with renal complications  (HCC)    CVA - Acute left cerebellar nonhemorrhagic infarction (per MRI 4/29) MRI did show acute left cerebellar infarct. TSH was normal. CT angiogram head and neck were also done. 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 This will need to be monitored as outpatient. Patient is currently on Plavix after failing aspirin. Meclizine for dizziness. Overall she is feeling better today. TEE as above. No clot was noted. Per neurology she may need to undergo recorder placement. This will be addressed by them today. Outpatient physical therapy recommended. Patient is on a statin.  Brain aneurysm Detected incidentally on MRI. Patient is asymptomatic. Needs outpatient monitoring.  Essential hypertension Blood pressure noted to be high. Permissive hypertension allowed due to stroke. May resume home medications at discharge.   Hyperlipidemia LDL is 81. Continue statin  Diabetes mellitus type 2 A1c 6.8. Continue sliding scale. Encouraged. Resume metformin at discharge.  Hyponatremia, hypochloremia These were mild. Patient was given IV fluids.  History of Depression Continue Celexa  DVT Prophylaxis: Lovenox    Code Status: Full code  Family Communication: Discussed with the patient and her family  Disposition Plan: Management as outlined above. Await further input from neurology, especially regarding loop recorder. Should be able to go home later today.    LOS: 2 days   [facility] Pager [phone] 07/31/2016, 12:58 PM  If 7PM-7AM, please contact night-coverage at [email or web address], password [facility]

## 2016-07-31 NOTE — Progress Notes (Signed)
Patient requesting prescriptions to be sent to [facility], states the mail order pharmacy will not deliver medications by tomorrow when medications are due. Notified on call provider, Schorr. Per NP, pt must call [facility] line tomorrow (number provided) and get prescriptions sent to pharmacy. [facility] pharmacy notified.

## 2016-07-31 NOTE — Progress Notes (Signed)
RN discussed discharge instructions with patient and family including f/u appts, pt's family states they will schedule appts, knows s/sx of stroke. Iv removed, tele remove. nihhs unchanged, neuro assessment unchanged

## 2016-07-31 NOTE — Care Management Note (Signed)
Case Management Note  Patient Details  Name: Penny Miller MRN: [id number] Date of Birth: 1932-03-31  Subjective/Objective:                    Action/Plan: Pt discharging home with orders for [facility] services. PT/OT recommending outpatient therapy. CM met with the patient and her granddaughter and they have decided to do outpatient therapy. They were interested in [facility]. Dr [place] Pink made aware.  Granddaughter is going to f/u with patients PCP about need for [facility] services if needed after outpatient.  Pt with orders for walker and 3 in 1. Santiago Glad with [facility] DME notified and will deliver the equipment to the room.  Granddaughter to provide 24/7 supervision for the next 2 weeks and transportation home.   Expected Discharge Date:                  Expected Discharge Plan:  Home/Self Care  In-House Referral:     Discharge planning Services  CM Consult  Post Acute Care Choice:  Durable Medical Equipment Choice offered to:  Patient, Adult Children  DME Arranged:  3-N-1, Walker rolling DME Agency:  [place]:    [place]:     Status of Service:  Completed, signed off  If discussed at [place] of Stay Meetings, dates discussed:    Additional Comments:  Pollie Friar, RN 07/31/2016, 2:24 PM

## 2016-07-31 NOTE — Discharge Summary (Addendum)
[facility]  Physician Discharge Summary   Patient ID: Penny Miller MRN: [id number] DOB/AGE: Apr 01, 1932 83 y.o.  Admit date: 07/28/2016 Discharge date: 08/01/2016  PCP: Jeanmarie Hubert, MD  DISCHARGE DIAGNOSES:  Principal Problem:   CVA (cerebral vascular accident) [facility]) Active Problems:   Essential hypertension   Hyperlipidemia   DM (diabetes mellitus), type 2 with renal complications ([place])   RECOMMENDATIONS FOR OUTPATIENT FOLLOW UP: 1. Outpatient PT and OT has been recommended and has been ordered 2. Outpatient surveillance of the left vertebral artery aneurysm detected incidentally  Realized on 5/3 that the e-prescriptions (sent on 5/2) were inadvertently sent to her mail order pharmacy instead of the local pharmacy. This was rectified on 5/3. Communicated to the patient's family. [facility] was called and they confirmed receiving prescriptions.  DISCHARGE CONDITION: fair  Diet recommendation: Modified carbohydrate  Filed Weights   07/29/16 0200  Weight: 81.6 kg (180 lb)    INITIAL HISTORY: 83 year old Caucasian female with a past medical history of hypertension, chronic kidney disease stage II, hyperlipidemia on statin, type 2 diabetes, dementia, presented with left lower extremity weakness and dizziness. MRI showed left cerebellar stroke. Patient was hospitalized. She was outside the TPA window as determined by neurology.  Consultations:  Neurology  Cardiology  Procedures: TEE Study Conclusions  - Left ventricle: Systolic function was normal. The estimated ejection fraction was in the range of 60% to 65%. Wall motion was normal; there were no regional wall motion abnormalities. - Aortic valve: No evidence of vegetation. - Aorta: Moderate mural aortic debris. - Mitral valve: There was mild regurgitation. - Left atrium: The atrium was dilated. No evidence of thrombus in the atrial cavity or appendage. No evidence of thrombus in  the atrial cavity or appendage. - Right atrium: No evidence of thrombus in the atrial cavity or appendage. No evidence of thrombus in the atrial cavity or appendage. - Atrial septum: No defect or patent foramen ovale was identified.  Impressions:  - No cardiac source of emboli was indentified.  Loop recorder placement   HOSPITAL COURSE:   CVA - Acute left cerebellar nonhemorrhagic infarction (per MRI 4/29) MRI did show acute left cerebellar infarct. CT angiogram head and neck were also done. 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 This will need to be monitored as outpatient. Patient is currently on Plavix after failing aspirin. TEE as above. No clot was noted. Patient underwent placement of a loop recorder by cardiology. Continue statin. TSH was normal. Outpatient physical therapy has been recommended. Follow up with neurology in a few weeks.  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 Patient is asymptomatic. Needs outpatient monitoring.  Essential hypertension Blood pressure noted to be high. Permissive hypertension allowed due to stroke. May resume home medications at discharge.   Hyperlipidemia LDL is 81. Continue statin at a higher dose.  Diabetes mellitus type 2 A1c 6.8. Continue home medications  Hyponatremia, hypochloremia These were mild. Patient was given IV fluids.  History of Depression Continue Celexa  Overall improved. Patient was discharged in stable condition. Discussed in detail with her and her family.   PERTINENT LABS:  The results of significant diagnostics from this hospitalization (including imaging, microbiology, ancillary and laboratory) are listed below for reference.      Labs: Basic Metabolic Panel:  Recent Labs Lab 07/28/16 1917  NA 133*  K 4.0  CL 100*  CO2 24  GLUCOSE 140*  BUN 15  CREATININE 1.01*  CALCIUM 9.4   CBC:  Recent Labs Lab 07/28/16 1917  WBC 8.8   NEUTROABS 6.3  HGB 13.0  HCT 38.8  MCV 93.7  PLT 166    CBG:  Recent Labs Lab 07/30/16 1627 07/30/16 2137 07/31/16 0612 07/31/16 1116 07/31/16 1652  GLUCAP 104* 147* 116* 170* 96     IMAGING STUDIES Ct Angio Head W Or Wo Contrast  Result Date: 07/29/2016 CLINICAL DATA:  83 y/o  F; embolic stroke. EXAM: CT ANGIOGRAPHY HEAD AND NECK TECHNIQUE: Multidetector CT imaging of the head and neck was performed using the standard protocol during bolus administration of intravenous contrast. Multiplanar CT image reconstructions and MIPs were obtained to evaluate the vascular anatomy. Carotid stenosis measurements (when applicable) are obtained utilizing NASCET criteria, using the distal internal carotid diameter as the denominator. CONTRAST:  50 cc Isovue 370 COMPARISON:  07/28/2016 MRI of the brain and CT of the head. FINDINGS: CT HEAD FINDINGS Brain: Focus of hypoattenuation within the left superior cerebellar hemisphere is stable in compatible with infarction. No evidence for new large territory infarct, significant mass effect, or acute intracranial hemorrhage. Stable chronic microvascular ischemic changes of white matter and parenchymal volume loss of the brain. No hydrocephalus or extra-axial collection. Vascular: As below. Skull: Normal. Negative for fracture or focal lesion. Sinuses: 7 mm right frontal sinus osteoma. Small fluid level within the sphenoid sinuses. Orbits: Bilateral intra-ocular lens replacement. Review of the MIP images confirms the above findings CTA NECK FINDINGS Aortic arch: Standard branching. Imaged portion shows no evidence of aneurysm or dissection. No significant stenosis of the major arch vessel origins. Moderate calcific atherosclerosis of the aortic arch. Right carotid system: No evidence of dissection, stenosis (50% or greater) or occlusion. Mild calcific atherosclerosis of the carotid bifurcation without stenosis. Tortuosity and kinking of the mid cervical internal  carotid artery. Left carotid system: No evidence of dissection, stenosis (50% or greater) or occlusion.Mild calcific atherosclerosis of the carotid bifurcation without stenosis. Tortuosity and kinking of the mid cervical internal carotid artery. Vertebral arteries: Left vertebral artery 2 mm superiorly directed outpouching at the C1 level (series 13, image 135) compatible with a small aneurysm. Skeleton: Mild cervical spondylosis with disc and facet degenerative changes greatest at the C4 through C7 levels. No high-grade bony canal stenosis. Other neck: Negative. Upper chest: Few scattered peripheral punctate nodules and mucous plugging of small airway is compatible with minimal bronchiolitis. Review of the MIP images confirms the above findings CTA HEAD FINDINGS Anterior circulation: No significant stenosis, proximal occlusion, aneurysm, or vascular malformation. Mild non stenotic calcific atherosclerosis of cavernous and paraclinoid internal carotid arteries. Posterior circulation: No significant stenosis, proximal occlusion, aneurysm, or vascular malformation. Venous sinuses: As permitted by contrast timing, patent. Anatomic variants: No bilateral fetal PCA with large posterior communicating artery is, diminutive right P1 segment, and no appreciable left P1 segment. Pole small anterior communicating artery. Delayed phase: No abnormal intracranial enhancement. Review of the MIP images confirms the above findings IMPRESSION: 1. Patent carotid and vertebral arteries. No large vessel occlusion of circle of Willis. 2. 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 3. Mild calcific atherosclerosis of carotid bifurcations and of carotid siphons without stenosis. 4. Left superior cerebellar hemisphere area of hypoattenuation compatible with infarction that is stable in comparison with prior studies. 5. No evidence for new large acute infarct, significant mass effect, or acute intracranial  hemorrhage. Electronically Signed   By: Kristine Garbe M.D.   On: 07/29/2016 17:36  Dg Chest 2 View  Result Date: 07/29/2016 CLINICAL DATA:  Follow-up TIA EXAM: CHEST  2 VIEW COMPARISON:  02/24/2013 FINDINGS: Lungs are clear. Mild biapical pleural-parenchymal scarring. No pleural effusion or pneumothorax. The heart is normal in size. Visualized osseous structures are within normal limits. IMPRESSION: Normal chest radiographs. Electronically Signed   By: Julian Hy M.D.   On: 07/29/2016 07:29   Ct Head Wo Contrast  Result Date: 07/28/2016 CLINICAL DATA:  Left-sided weakness and dizziness left leg weakness EXAM: CT HEAD WITHOUT CONTRAST TECHNIQUE: Contiguous axial images were obtained from the base of the skull through the vertex without intravenous contrast. COMPARISON:  07/08/2016 FINDINGS: Brain: Focal low attenuation within the left anterior, superior cerebellum, suspicious for a focal area of edema. No hemorrhage or mass. Old lacune in the right basal ganglia. Moderate periventricular, subcortical and deep white matter small vessel ischemic changes. The ventricles are stable in size. Mild cortical atrophy. Vascular: No hyperdense vessels. Carotid artery calcifications. Mild vertebral artery calcifications. Skull: No fracture or suspicious bone lesion Sinuses/Orbits: Osteoma right frontal sinus. Mild mucosal thickening in the ethmoid sinuses. Fluid level in the right sphenoid sinus. No acute orbital abnormality. Other: None IMPRESSION: 1. Focal hypodensity in the left anterior, superior cerebellum. This is suspicious for a focal area of edema/ischemia. MRI recommended for further evaluation. No hemorrhage. 2. Atrophy and chronic white matter small vessel ischemic changes 3. Fluid level in the sphenoid sinus with mucosal disease in the ethmoid sinuses. Electronically Signed   By: Donavan Foil M.D.   On: 07/28/2016 21:10   Ct Head Wo Contrast  Result Date: 07/08/2016 CLINICAL DATA:   Headache and dizziness EXAM: CT HEAD WITHOUT CONTRAST TECHNIQUE: Contiguous axial images were obtained from the base of the skull through the vertex without intravenous contrast. COMPARISON:  None. FINDINGS: Brain: There is mild diffuse atrophy. There is no intracranial mass, hemorrhage, extra-axial fluid collection, or midline shift. There is patchy small vessel disease in the centra semiovale bilaterally. Elsewhere gray-white compartments appear normal. There is no evident acute infarct. Vascular: There is no appreciable hyperdense vessel. There is calcification in both carotid siphon regions. There is calcification in each distal vertebral artery. Skull: Bony calvarium appears intact. Sinuses/Orbits: There is an air-fluid level in the right sphenoid sinus. There is mild opacification in several ethmoid air cells bilaterally. There is a calcified benign osteoma measuring 8 x 8 mm in the inferior right frontal sinus. Visualized orbits appear symmetric bilaterally. Other: Mastoid air cells are clear. IMPRESSION: Atrophy with patchy periventricular small vessel disease. No intracranial mass, hemorrhage, or extra-axial fluid collection. No acute infarct evident. There is an air-fluid level in the right sphenoid sinus region. There is mucosal thickening in several ethmoid air cells. There is a benign small frontal osteoma on the right. There are foci of arterial vascular calcification. Electronically Signed   By: Lowella Grip III M.D.   On: 07/08/2016 15:28   Ct Angio Neck W Or Wo Contrast  Result Date: 07/29/2016 CLINICAL DATA:  83 y/o  F; embolic stroke. EXAM: CT ANGIOGRAPHY HEAD AND NECK TECHNIQUE: Multidetector CT imaging of the head and neck was performed using the standard protocol during bolus administration of intravenous contrast. Multiplanar CT image reconstructions and MIPs were obtained to evaluate the vascular anatomy. Carotid stenosis measurements (when applicable) are obtained utilizing NASCET  criteria, using the distal internal carotid diameter as the denominator. CONTRAST:  50 cc Isovue 370 COMPARISON:  07/28/2016 MRI of the brain and CT of the head.  FINDINGS: CT HEAD FINDINGS Brain: Focus of hypoattenuation within the left superior cerebellar hemisphere is stable in compatible with infarction. No evidence for new large territory infarct, significant mass effect, or acute intracranial hemorrhage. Stable chronic microvascular ischemic changes of white matter and parenchymal volume loss of the brain. No hydrocephalus or extra-axial collection. Vascular: As below. Skull: Normal. Negative for fracture or focal lesion. Sinuses: 7 mm right frontal sinus osteoma. Small fluid level within the sphenoid sinuses. Orbits: Bilateral intra-ocular lens replacement. Review of the MIP images confirms the above findings CTA NECK FINDINGS Aortic arch: Standard branching. Imaged portion shows no evidence of aneurysm or dissection. No significant stenosis of the major arch vessel origins. Moderate calcific atherosclerosis of the aortic arch. Right carotid system: No evidence of dissection, stenosis (50% or greater) or occlusion. Mild calcific atherosclerosis of the carotid bifurcation without stenosis. Tortuosity and kinking of the mid cervical internal carotid artery. Left carotid system: No evidence of dissection, stenosis (50% or greater) or occlusion.Mild calcific atherosclerosis of the carotid bifurcation without stenosis. Tortuosity and kinking of the mid cervical internal carotid artery. Vertebral arteries: Left vertebral artery 2 mm superiorly directed outpouching at the C1 level (series 13, image 135) compatible with a small aneurysm. Skeleton: Mild cervical spondylosis with disc and facet degenerative changes greatest at the C4 through C7 levels. No high-grade bony canal stenosis. Other neck: Negative. Upper chest: Few scattered peripheral punctate nodules and mucous plugging of small airway is compatible with  minimal bronchiolitis. Review of the MIP images confirms the above findings CTA HEAD FINDINGS Anterior circulation: No significant stenosis, proximal occlusion, aneurysm, or vascular malformation. Mild non stenotic calcific atherosclerosis of cavernous and paraclinoid internal carotid arteries. Posterior circulation: No significant stenosis, proximal occlusion, aneurysm, or vascular malformation. Venous sinuses: As permitted by contrast timing, patent. Anatomic variants: No bilateral fetal PCA with large posterior communicating artery is, diminutive right P1 segment, and no appreciable left P1 segment. Pole small anterior communicating artery. Delayed phase: No abnormal intracranial enhancement. Review of the MIP images confirms the above findings IMPRESSION: 1. Patent carotid and vertebral arteries. No large vessel occlusion of circle of Willis. 2. 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 3. Mild calcific atherosclerosis of carotid bifurcations and of carotid siphons without stenosis. 4. Left superior cerebellar hemisphere area of hypoattenuation compatible with infarction that is stable in comparison with prior studies. 5. No evidence for new large acute infarct, significant mass effect, or acute intracranial hemorrhage. Electronically Signed   By: Kristine Garbe M.D.   On: 07/29/2016 17:36   Mr Jeri Cos And Wo Contrast  Result Date: 07/28/2016 CLINICAL DATA:  Vertigo, follow-up abnormal CT. Woke up today with dizziness and LEFT-sided weakness. History of diabetes, hypertension, memory loss. EXAM: MRI HEAD WITHOUT AND WITH CONTRAST TECHNIQUE: Multiplanar, multiecho pulse sequences of the brain and surrounding structures were obtained without and with intravenous contrast. CONTRAST:  32mL MULTIHANCE GADOBENATE DIMEGLUMINE 529 MG/ML IV SOLN COMPARISON:  CT HEAD July 28, 2016 at 2007 hours FINDINGS: Postcontrast sequences are mildly motion degraded. INTRACRANIAL  CONTENTS: 21 x 18 mm area reduced diffusion LEFT superior cerebellum with low ADC values within superior cerebellar artery territory. No susceptibility artifact to suggest acute hemorrhage ; a few punctate chronic micro hemorrhages associated with hypertension. The ventricles and sulci are normal for patient's age. No suspicious parenchymal signal, masses, mass effect. Patchy to confluent supratentorial and pontine white matter FLAIR T2 hyperintensities. 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 No abnormal intraparenchymal  or extra-axial enhancement. No abnormal extra-axial fluid collections. No extra-axial masses. VASCULAR: Normal major intracranial vascular flow voids present at skull base. SKULL AND UPPER CERVICAL SPINE: No abnormal sellar expansion. No suspicious calvarial bone marrow signal. Craniocervical junction maintained. Grade 1 C3-4 anterolisthesis. SINUSES/ORBITS: Small RIGHT sphenoid sinus air-fluid level. Mastoid air cells are well aerated. The included ocular globes and orbital contents are non-suspicious. Status post bilateral ocular lens implants. OTHER: None. IMPRESSION: Acute LEFT cerebellar/SCA territory nonhemorrhagic infarct corresponding to CT abnormality. Moderate chronic small vessel ischemic disease and old bilateral small cerebellar infarcts. Electronically Signed   By: Elon Alas M.D.   On: 07/28/2016 23:24    DISCHARGE EXAMINATION: See progress note from earlier today  DISPOSITION: Home with outpatient PT and OT  Discharge Instructions    Ambulatory referral to Neurology    Complete by:  As directed    An appointment is requested in approximately: 4 weeks   Ambulatory referral to Occupational Therapy    Complete by:  As directed    Ambulatory referral to Occupational Therapy    Complete by:  As directed    Ambulatory referral to Physical Therapy    Complete by:  As directed    Ambulatory referral to Physical Therapy    Complete by:  As directed    Call MD for:   difficulty breathing, headache or visual disturbances    Complete by:  As directed    Call MD for:  extreme fatigue    Complete by:  As directed    Call MD for:  persistant dizziness or light-headedness    Complete by:  As directed    Call MD for:  persistant nausea and vomiting    Complete by:  As directed    Call MD for:  severe uncontrolled pain    Complete by:  As directed    Call MD for:  temperature >100.4    Complete by:  As directed    Diet - low sodium heart healthy    Complete by:  As directed    Diet Carb Modified    Complete by:  As directed    Discharge instructions    Complete by:  As directed    Please take your medications as prescribed. Follow-up with her primary care provider in one week. Go for outpatient physical and occupational therapy.  You were cared for by a hospitalist during your hospital stay. If you have any questions about your discharge medications or the care you received while you were in the hospital after you are discharged, you can call the unit and asked to speak with the hospitalist on call if the hospitalist that took care of you is not available. Once you are discharged, your primary care physician will handle any further medical issues. Please note that NO REFILLS for any discharge medications will be authorized once you are discharged, as it is imperative that you return to your primary care physician (or establish a relationship with a primary care physician if you do not have one) for your aftercare needs so that they can reassess your need for medications and monitor your lab values. If you do not have a primary care physician, you can call [phone] for a physician referral.   Increase activity slowly    Complete by:  As directed       ALLERGIES:  Allergies  Allergen Reactions  . Ceclor [Cefaclor] Other (See Comments)    Unknown reaction  . Codeine Nausea And Vomiting  .  Lopid [Gemfibrozil] Other (See Comments)    Unknown reaction  .  Metoprolol Nausea And Vomiting  . Morphine And Related Other (See Comments)    Passed out  . Niacin And Related Other (See Comments)    Unknown reaction  . Relafen [Nabumetone] Nausea And Vomiting  . Septra [Sulfamethoxazole-Trimethoprim] Nausea And Vomiting  . Tetracycline Nausea And Vomiting  . Toprol Xl [Metoprolol Tartrate] Nausea And Vomiting      Discharge Medication List as of 07/31/2016  6:36 PM    START taking these medications   Details  clopidogrel (PLAVIX) 75 MG tablet Take 1 tablet (75 mg total) by mouth daily., Starting Thu 08/01/2016, Normal      CONTINUE these medications which have CHANGED   Details  atorvastatin (LIPITOR) 40 MG tablet Take 1 tablet (40 mg total) by mouth daily at 6 PM., Starting Wed 07/31/2016, Normal      CONTINUE these medications which have NOT CHANGED   Details  albuterol (PROVENTIL HFA;VENTOLIN HFA) 108 (90 Base) MCG/ACT inhaler Inhale 2 puffs into the lungs every 6 (six) hours as needed for wheezing or shortness of breath., Historical Med    amLODipine (NORVASC) 2.5 MG tablet Take one tablet twice daily for blood pressure, Normal    atenolol (TENORMIN) 50 MG tablet TAKE 1 TABLET DAILY TO HELPBLOOD PRESSURE, Normal    calcium carbonate (TUMS EX) 750 MG chewable tablet Chew 1 tablet by mouth daily as needed for heartburn. , Historical Med    citalopram (CELEXA) 10 MG tablet TAKE 1/2 TABLET DAILY FOR  DEPRESSION, Normal    Dextromethorphan-Guaifenesin (MUCINEX DM MAXIMUM STRENGTH PO) Take 30 mLs by mouth daily as needed (congestion). , Historical Med    meclizine (ANTIVERT) 25 MG tablet Take 1 tablet (25 mg total) by mouth 3 (three) times daily as needed for dizziness or nausea., Starting Mon 07/08/2016, Print    metFORMIN (GLUCOPHAGE) 500 MG tablet TAKE 1 TABLET EVERY MORNINGFOR BLOOD SUGAR, Normal    naproxen sodium (ALEVE) 220 MG tablet Take 220 mg by mouth 2 (two) times daily with a meal., Historical Med    nitrofurantoin (MACRODANTIN)  100 MG capsule Take one capsule by mouth once daily for bladder, Normal    Vitamin D, Ergocalciferol, (DRISDOL) 50000 units CAPS capsule TAKE 1 CAPSULE WEEKLY FOR  VITAMIN D, Normal      STOP taking these medications     aspirin EC 81 MG tablet          Follow-up Information    [facility].   Specialty:  Rehabilitation Why:  They will contact you for the first visit. Contact information: [address] [place] [id number] [place] [address] [phone]       SETHI,PRAMOD, MD. Schedule an appointment as soon as possible for a visit in 4 week(s).   Specialties:  Neurology, Radiology Contact information: [place] [address] [phone]        [facility] Follow up on 08/14/2016.   Specialty:  Cardiology Why:  2:30PM, wound check Contact information: [address], [place]       Jeanmarie Hubert, MD. Schedule an appointment as soon as possible for a visit in 1 week(s).   Specialty:  Internal Medicine Contact information: [place] [address] [phone]           TOTAL DISCHARGE TIME: 72 minutes  [facility] Pager  [phone]  08/01/2016, 12:26 PM

## 2016-07-31 NOTE — Progress Notes (Signed)
STROKE TEAM PROGRESS NOTE   SUBJECTIVE (INTERVAL HISTORY) Patient  Has remained stable overnight. TEE was fine but loop recorder is pending.  OBJECTIVE Temp:  [97.7 F (36.5 C)-99.2 F (37.3 C)] 98.3 F (36.8 C) (05/02 1339) Pulse Rate:  [53-59] 58 (05/02 1339) Cardiac Rhythm: Heart block (05/02 0823) Resp:  [16-20] 18 (05/02 1339) BP: (147-181)/(52-79) 151/69 (05/02 1339) SpO2:  [94 %-97 %] 97 % (05/02 1339)  CBC:   Recent Labs Lab 07/28/16 1917  WBC 8.8  NEUTROABS 6.3  HGB 13.0  HCT 38.8  MCV 93.7  PLT 761    Basic Metabolic Panel:   Recent Labs Lab 07/28/16 1917  NA 133*  K 4.0  CL 100*  CO2 24  GLUCOSE 140*  BUN 15  CREATININE 1.01*  CALCIUM 9.4   HgbA1c:  Lab Results  Component Value Date   HGBA1C 6.8 (H) 07/29/2016   CT Angio brain and neck 07/29/2016 : 1. Patent carotid and vertebral arteries. No large vessel occlusion of circle of Willis. 2. 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 3. Mild calcific atherosclerosis of carotid bifurcations and of carotid siphons without stenosis.  PHYSICAL EXAM Pleasant elderly Caucasian lady not in distress. . Afebrile. Head is nontraumatic. Neck is supple without bruit.    Cardiac exam no murmur or gallop. Lungs are clear to auscultation. Distal pulses are well felt. Neurological Exam :  Awake alert oriented 3 with normal speech and language function. No aphasia, dysarthria or apraxia. She follows commands well. Intact attention, registration and recall. Fundi were not visualized. Extraocular moments are full range without nystagmus. Slight impairment of psychiatric I moments to the left. Face is symmetric without weakness. Tongue is midline. Motor system exam reveals no upper or lower eczema to drift. Slight impairment of finger-to-nose and knee to heel coordination on the left. Mild giveaway weakness of the left grip compared to the right. Deep tendon reflexes are 2+ symmetric.  Plantars are downgoing. Sensation is intact. Gait was not tested. ASSESSMENT/PLAN Penny Miller is a 83 y.o. female with history of memory loss, HTN, HLD, diabetes presenting with severe vertigo, nausea, dry heaves, slurred speech and unsteady on her feet with L arm and leg weakness. She did not receive IV t-PA due to delay in arrival.   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 small vessel disease. Old B small cerebellar infarcts.   CTA head and neck pending  2D Echo  Pending  TEE normal  LDL 81  HgbA1c 6.8  Lovenox 40 mg sq daily for VTE prophylaxis Diet Carb Modified Fluid consistency: Thin; Room service appropriate? Yes  aspirin 81 mg daily prior to admission, now on clopidogrel 75 mg daily Therapy recommendations:  CLR Disposition:  CLR Hypertension  Stable  Permissive hypertension (OK if < 220/120) but gradually normalize in 5-7 days  Long-term BP goal normotensive  Hyperlipidemia  Home meds:  lipitor 20  Lipitor increased to 40 mg in hospital  LDL 81 goal  Continue statin at discharge  Diabetes  HgbA1c goal < 7.0  Glucose elevated 140   Small 2 mm Left vertebral aneurysm- incidental unrelated to stroke  Other Stroke Risk Factors  Advanced age  Former Cigarette smoker, quit 5 years ago  UDS / ETOH level not performed   Overweight, Body mass index  is 29.05 kg/m.  Hospital day # 2    Place loop recorder and transfer to inpatient rehab.I discussed with patient that her small 2 mm vertebral artery aneurysm is asymptomatic unrelated to her stroke and needs conservative follow up at this time and she agrees.  . Continue Plavix for stroke prevention. Long discussion of the bedside with the patient and husband and answered questions. Discussed with Dr. [place] Pink Greater than 50% time  during this 25 minute visit was spent on counseling and coordination of care about embolic stroke, cerebral aneurysm discussion about evaluation and treatment plan and answering questions.  Antony Contras, MD Medical Director [place] Pager: [phone] 07/31/2016 3:19 PM   To contact Stroke Continuity provider, please refer to [email or web address] After hours, contact General Neurology

## 2016-07-31 NOTE — Discharge Instructions (Signed)
Keep incision clean and dry for 3 days. °You can remove outer dressing tomorrow. °Leave steri-strips (little pieces of tape) on until seen in the office for wound check appointment. °Call the office ([phone]) for redness, drainage, swelling, or fever. ° °

## 2016-07-31 NOTE — Consult Note (Signed)
ELECTROPHYSIOLOGY CONSULT NOTE  Patient ID: Penny Miller MRN: [id number], DOB/AGE: [birth year]   Admit date: 07/28/2016 Date of Consult: 07/31/2016  Primary Physician: Jeanmarie Hubert, MD Primary Cardiologist: Dr. Percival Spanish Reason for Consultation: Cryptogenic stroke ; recommendations regarding Implantable Loop Recorder  History of Present Illness Penny Miller was admitted on 07/28/2016 with an acute CVA.  She first developed symptoms while at home. Sudden onset of ataxia associated with severe nausea/wretching, no vomiting and eventually L leg weakness.  This was similar to an event she had 3 weeks ago that was felt to have been vertigo +/-orthostatic hypotension.  PMHx notes HTN, HLD, DM, and mild dementia, 20 years ago she had a "tachycardia" and was started on atenolol and has never felt palpitations again.  She saw cardiology a few months back with episodes of dizziness, thought to be 2/2 orthostatic changes.  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  she has undergone workup for stroke including TEE and carotid angio.  The patient has been monitored on telemetry which has demonstrated sinus rhythm with no arrhythmias.  Inpatient stroke work-up has been completed with a TEE.   Echocardiogram  TTE is not completed TEE noted:  Study Conclusions  - Left ventricle: Systolic function was normal. The estimated   ejection fraction was in the range of 60% to 65%. Wall motion was   normal; there were no regional wall motion abnormalities. - Aortic valve: No evidence of vegetation. - Aorta: Moderate mural aortic debris. - Mitral valve: There was mild regurgitation. - Left atrium: The atrium was dilated. No evidence of thrombus in   the atrial cavity or appendage. No evidence of thrombus in the   atrial cavity or appendage. - Right atrium: No evidence of thrombus in the atrial cavity or   appendage. No evidence of thrombus in the atrial cavity or  appendage. - Atrial septum: No defect or patent foramen ovale was identified. Impressions: - No cardiac source of emboli was indentified.   Lab work is reviewed.  Prior to admission, the patient denies chest pain, shortness of breath, dizziness, palpitations, or syncope.  They are recovering from their stroke with plans to home at discharge.  EP has been asked to evaluate for placement of an implantable loop recorder to monitor for atrial fibrillation.     Past Medical History:  Diagnosis Date  . Allergic rhinitis, cause unspecified   . Anxiety state, unspecified   . Diaphragmatic hernia without mention of obstruction or gangrene   . Diverticulosis of colon (without mention of hemorrhage)   . Female stress incontinence   . Insomnia, unspecified   . Interstitial cystitis    surgery 02/01/2013  . Irritable bowel syndrome   . Memory loss   . Other and unspecified hyperlipidemia   . Other premature beats   . Polyneuropathy in diabetes(357.2)   . Postmenopausal bleeding   . Proteinuria   . 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 Unspecified essential hypertension   . Viral warts, unspecified      Surgical History:  Past Surgical History:  Procedure Laterality Date  . ABDOMINAL HYSTERECTOMY  1977   Dr.Hambright  . APPENDECTOMY  1965  . CATARACT EXTRACTION, BILATERAL  02/10/2006   Dr.Groat   . COLONOSCOPY  07/17/2004   polypectomy  . [place]  . TRIGGER FINGER RELEASE  1990   Dr.Carter  . TRIGGER FINGER  RELEASE  05/13/16  . urinary bladder      surgery 02/01/2013 to stretch bladder stem     Prescriptions Prior to Admission  Medication Sig Dispense Refill Last Dose  . albuterol (PROVENTIL HFA;VENTOLIN HFA) 108 (90 Base) MCG/ACT inhaler Inhale 2 puffs into the lungs every 6 (six) hours as needed for wheezing or shortness of breath.   week ago  . amLODipine (NORVASC) 2.5  MG tablet Take one tablet twice daily for blood pressure (Patient taking differently: Take 2.5 mg by mouth 2 (two) times daily. for blood pressure) 180 tablet 3 07/28/2016 at noon  . aspirin EC 81 MG tablet Take 81 mg by mouth daily.   07/28/2016 at Unknown time  . atenolol (TENORMIN) 50 MG tablet TAKE 1 TABLET DAILY TO HELPBLOOD PRESSURE (Patient taking differently: TAKE 1 TABLET DAILY TO HELP BLOOD PRESSURE) 90 tablet 2 07/28/2016 at noon  . calcium carbonate (TUMS EX) 750 MG chewable tablet Chew 1 tablet by mouth daily as needed for heartburn.    week ago  . citalopram (CELEXA) 10 MG tablet TAKE 1/2 TABLET DAILY FOR  DEPRESSION 45 tablet 3 07/27/2016 at Unknown time  . Dextromethorphan-Guaifenesin (MUCINEX DM MAXIMUM STRENGTH PO) Take 30 mLs by mouth daily as needed (congestion).    07/26/2016  . meclizine (ANTIVERT) 25 MG tablet Take 1 tablet (25 mg total) by mouth 3 (three) times daily as needed for dizziness or nausea. (Patient taking differently: Take 12.5-25 mg by mouth 3 (three) times daily as needed for dizziness or nausea. ) 15 tablet 0 07/28/2016 at Unknown time  . metFORMIN (GLUCOPHAGE) 500 MG tablet TAKE 1 TABLET EVERY MORNINGFOR BLOOD SUGAR (Patient taking differently: TAKE 1 TABLET EVERY MORNING FOR BLOOD SUGAR) 90 tablet 2 07/27/2016 at am  . naproxen sodium (ALEVE) 220 MG tablet Take 220 mg by mouth 2 (two) times daily with a meal.   07/27/2016 at pm  . nitrofurantoin (MACRODANTIN) 100 MG capsule Take one capsule by mouth once daily for bladder (Patient taking differently: Take 100 mg by mouth at bedtime as needed (urinary burning/pressure). ) 90 capsule 3 few nights ago  . Vitamin D, Ergocalciferol, (DRISDOL) 50000 units CAPS capsule TAKE 1 CAPSULE WEEKLY FOR  VITAMIN D (Patient taking differently: TAKE 1 CAPSULE BY MOUTH EVERY SATURDAY FOR  VITAMIN D) 12 capsule 1 07/20/2016  . [DISCONTINUED] atorvastatin (LIPITOR) 20 MG tablet TAKE 1 TABLET DAILY AT     BEDTIME FOR CHOLESTEROL 90 tablet 3  07/27/2016 at pm    Inpatient Medications:  . amLODipine  2.5 mg Oral Daily  . atorvastatin  40 mg Oral q1800  . citalopram  5 mg Oral Daily  . clopidogrel  75 mg Oral Daily  . enoxaparin (LOVENOX) injection  40 mg Subcutaneous Daily  . insulin aspart  0-15 Units Subcutaneous TID WC    Allergies:  Allergies  Allergen Reactions  . Ceclor [Cefaclor] Other (See Comments)    Unknown reaction  . Codeine Nausea And Vomiting  . Lopid [Gemfibrozil] Other (See Comments)    Unknown reaction  . Metoprolol Nausea And Vomiting  . Morphine And Related Other (See Comments)    Passed out  . Niacin And Related Other (See Comments)    Unknown reaction  . Relafen [Nabumetone] Nausea And Vomiting  . Septra [Sulfamethoxazole-Trimethoprim] Nausea And Vomiting  . Tetracycline Nausea And Vomiting  . Toprol Xl [Metoprolol Tartrate] Nausea And Vomiting    Social History   Social History  . Marital status: Widowed  Spouse name: N/A  . Number of children: 1  . Years of education: N/A   Occupational History  . 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 Smoking status: Current Every Day Smoker    Years: 62.00  . Smokeless tobacco: Never Used     Comment: stopped consistently smoking in 2012 but still smokes occasionally  . Alcohol use No  . Drug use: No  . Sexual activity: No   Other Topics Concern  . Not on file   Social History Narrative   Ophthalmologist-Dr.Gould and Dr.Groat   Podiatrist- Dr.Tuchman   Dermatologist- Dr.Lupton   Urologist- Dr. Lawerance Bach    Lives with her son.         Family History  Problem Relation Age of Onset  . Cancer Mother     pancreatic  . Cancer Father     bladder  . Hypertension Sister   . Diabetes Sister   . Diabetes Brother   . Kidney cancer Brother   . Hypertension Brother   . Diabetes Brother   . Heart disease Brother 40    Myocardial Infarction       Review of Systems: All other systems reviewed and are otherwise  negative except as noted above.  Physical Exam: Vitals:   07/31/16 0149 07/31/16 0400 07/31/16 1000 07/31/16 1339  BP: (!) 157/52 (!) 181/79 (!) 156/61 (!) 151/69  Pulse: (!) 53 (!) 56 (!) 59 (!) 58  Resp: 18 18 16 18   Temp: 97.7 F (36.5 C) 98.5 F (36.9 C) 97.9 F (36.6 C) 98.3 F (36.8 C)  TempSrc: Oral Oral Oral Oral  SpO2: 95% 96% 94% 97%  Weight:      Height:        GEN- The patient is well appearing, alert and oriented x 3 today.   Head- normocephalic, atraumatic Eyes-  Sclera clear, conjunctiva pink Ears- hearing intact Oropharynx- clear Neck- supple Lungs- CTA b/l, normal work of breathing Heart- RRR, no murmurs, rubs or gallops  GI- soft, NT, ND Extremities- no clubbing, cyanosis, or edema MS- no significant deformity or atrophy Skin- no rash or lesion Psych- euthymic mood, full affect   Labs:   Lab Results  Component Value Date   WBC 8.8 07/28/2016   HGB 13.0 07/28/2016   HCT 38.8 07/28/2016   MCV 93.7 07/28/2016   PLT 166 07/28/2016    Recent Labs Lab 07/28/16 1917  NA 133*  K 4.0  CL 100*  CO2 24  BUN 15  CREATININE 1.01*  CALCIUM 9.4  GLUCOSE 140*   No results found for: CKTOTAL, CKMB, CKMBINDEX, TROPONINI Lab Results  Component Value Date   CHOL 162 07/29/2016   CHOL 151 05/07/2016   CHOL 158 10/30/2015   Lab Results  Component Value Date   HDL 38 (L) 07/29/2016   HDL 42 (L) 05/07/2016   HDL 43 (L) 10/30/2015   Lab Results  Component Value Date   LDLCALC 81 07/29/2016   LDLCALC 65 05/07/2016   LDLCALC 70 10/30/2015   Lab Results  Component Value Date   TRIG 217 (H) 07/29/2016   TRIG 218 (H) 05/07/2016   TRIG 226 (H) 10/30/2015   Lab Results  Component Value Date   CHOLHDL 4.3 07/29/2016   CHOLHDL 3.6 05/07/2016   CHOLHDL 3.7 10/30/2015   No results found for: LDLDIRECT  No results found for: DDIMER   Radiology/Studies:  Ct Angio Head W Or Wo Contrast Result  Date: 07/29/2016 CLINICAL DATA:  83 y/o  F; embolic  stroke. EXAM: CT ANGIOGRAPHY HEAD AND NECK TECHNIQUE: Multidetector CT imaging of the head and neck was performed using the standard protocol during bolus administration of intravenous contrast. Multiplanar CT image reconstructions and MIPs were obtained to evaluate the vascular anatomy. Carotid stenosis measurements (when applicable) are obtained utilizing NASCET criteria, using the distal internal carotid diameter as the denominator. CONTRAST:  50 cc Isovue 370 COMPARISON:  07/28/2016 MRI of the brain and CT of the head. FINDINGS: CT HEAD FINDINGS Brain: Focus of hypoattenuation within the left superior cerebellar hemisphere is stable in compatible with infarction. No evidence for new large territory infarct, significant mass effect, or acute intracranial hemorrhage. Stable chronic microvascular ischemic changes of white matter and parenchymal volume loss of the brain. No hydrocephalus or extra-axial collection. Vascular: As below. Skull: Normal. Negative for fracture or focal lesion. Sinuses: 7 mm right frontal sinus osteoma. Small fluid level within the sphenoid sinuses. Orbits: Bilateral intra-ocular lens replacement. Review of the MIP images confirms the above findings CTA NECK FINDINGS Aortic arch: Standard branching. Imaged portion shows no evidence of aneurysm or dissection. No significant stenosis of the major arch vessel origins. Moderate calcific atherosclerosis of the aortic arch. Right carotid system: No evidence of dissection, stenosis (50% or greater) or occlusion. Mild calcific atherosclerosis of the carotid bifurcation without stenosis. Tortuosity and kinking of the mid cervical internal carotid artery. Left carotid system: No evidence of dissection, stenosis (50% or greater) or occlusion.Mild calcific atherosclerosis of the carotid bifurcation without stenosis. Tortuosity and kinking of the mid cervical internal carotid artery. Vertebral arteries: Left vertebral artery 2 mm superiorly directed  outpouching at the C1 level (series 13, image 135) compatible with a small aneurysm. Skeleton: Mild cervical spondylosis with disc and facet degenerative changes greatest at the C4 through C7 levels. No high-grade bony canal stenosis. Other neck: Negative. Upper chest: Few scattered peripheral punctate nodules and mucous plugging of small airway is compatible with minimal bronchiolitis. Review of the MIP images confirms the above findings CTA HEAD FINDINGS Anterior circulation: No significant stenosis, proximal occlusion, aneurysm, or vascular malformation. Mild non stenotic calcific atherosclerosis of cavernous and paraclinoid internal carotid arteries. Posterior circulation: No significant stenosis, proximal occlusion, aneurysm, or vascular malformation. Venous sinuses: As permitted by contrast timing, patent. Anatomic variants: No bilateral fetal PCA with large posterior communicating artery is, diminutive right P1 segment, and no appreciable left P1 segment. Pole small anterior communicating artery. Delayed phase: No abnormal intracranial enhancement. Review of the MIP images confirms the above findings IMPRESSION: 1. Patent carotid and vertebral arteries. No large vessel occlusion of circle of Willis. 2. 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 3. Mild calcific atherosclerosis of carotid bifurcations and of carotid siphons without stenosis. 4. Left superior cerebellar hemisphere area of hypoattenuation compatible with infarction that is stable in comparison with prior studies. 5. No evidence for new large acute infarct, significant mass effect, or acute intracranial hemorrhage. Electronically Signed   By: Kristine Garbe M.D.   On: 07/29/2016 17:36    Dg Chest 2 View Result Date: 07/29/2016 CLINICAL DATA:  Follow-up TIA EXAM: CHEST  2 VIEW COMPARISON:  02/24/2013 FINDINGS: Lungs are clear. Mild biapical pleural-parenchymal scarring. No pleural effusion or  pneumothorax. The heart is normal in size. Visualized osseous structures are within normal limits. IMPRESSION: Normal chest radiographs. Electronically Signed   By: Julian Hy M.D.   On: 07/29/2016 07:29  Ct Head Wo Contrast Result Date: 07/28/2016 CLINICAL DATA:  Left-sided weakness and dizziness left leg weakness EXAM: CT HEAD WITHOUT CONTRAST TECHNIQUE: Contiguous axial images were obtained from the base of the skull through the vertex without intravenous contrast. COMPARISON:  07/08/2016 FINDINGS: Brain: Focal low attenuation within the left anterior, superior cerebellum, suspicious for a focal area of edema. No hemorrhage or mass. Old lacune in the right basal ganglia. Moderate periventricular, subcortical and deep white matter small vessel ischemic changes. The ventricles are stable in size. Mild cortical atrophy. Vascular: No hyperdense vessels. Carotid artery calcifications. Mild vertebral artery calcifications. Skull: No fracture or suspicious bone lesion Sinuses/Orbits: Osteoma right frontal sinus. Mild mucosal thickening in the ethmoid sinuses. Fluid level in the right sphenoid sinus. No acute orbital abnormality. Other: None IMPRESSION: 1. Focal hypodensity in the left anterior, superior cerebellum. This is suspicious for a focal area of edema/ischemia. MRI recommended for further evaluation. No hemorrhage. 2. Atrophy and chronic white matter small vessel ischemic changes 3. Fluid level in the sphenoid sinus with mucosal disease in the ethmoid sinuses. Electronically Signed   By: Donavan Foil M.D.   On: 07/28/2016 21:10    Mr Brain W And Wo Contrast Result Date: 07/28/2016 CLINICAL DATA:  Vertigo, follow-up abnormal CT. Woke up today with dizziness and LEFT-sided weakness. History of diabetes, hypertension, memory loss. EXAM: MRI HEAD WITHOUT AND WITH CONTRAST TECHNIQUE: Multiplanar, multiecho pulse sequences of the brain and surrounding structures were obtained without and with  intravenous contrast. CONTRAST:  45mL MULTIHANCE GADOBENATE DIMEGLUMINE 529 MG/ML IV SOLN COMPARISON:  CT HEAD July 28, 2016 at 2007 hours FINDINGS: Postcontrast sequences are mildly motion degraded. INTRACRANIAL CONTENTS: 21 x 18 mm area reduced diffusion LEFT superior cerebellum with low ADC values within superior cerebellar artery territory. No susceptibility artifact to suggest acute hemorrhage ; a few punctate chronic micro hemorrhages associated with hypertension. The ventricles and sulci are normal for patient's age. No suspicious parenchymal signal, masses, mass effect. Patchy to confluent supratentorial and pontine white matter FLAIR T2 hyperintensities. 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 No abnormal intraparenchymal or extra-axial enhancement. No abnormal extra-axial fluid collections. No extra-axial masses. VASCULAR: Normal major intracranial vascular flow voids present at skull base. SKULL AND UPPER CERVICAL SPINE: No abnormal sellar expansion. No suspicious calvarial bone marrow signal. Craniocervical junction maintained. Grade 1 C3-4 anterolisthesis. SINUSES/ORBITS: Small RIGHT sphenoid sinus air-fluid level. Mastoid air cells are well aerated. The included ocular globes and orbital contents are non-suspicious. Status post bilateral ocular lens implants. OTHER: None. IMPRESSION: Acute LEFT cerebellar/SCA territory nonhemorrhagic infarct corresponding to CT abnormality. Moderate chronic small vessel ischemic disease and old bilateral small cerebellar infarcts. Electronically Signed   By: Elon Alas M.D.   On: 07/28/2016 23:24    12-lead ECG SR, 1st degee AVblock All prior EKG's in EPIC reviewed with no documented atrial fibrillation Telemetry SB/SR, infrequent PAC's, 1st degree AVblock  Assessment and Plan:  1. Cryptogenic stroke The patient presents with cryptogenic stroke.  The patient has a TEE planned for this AM.  I spoke at length with the patient about monitoring for afib  with either a 30 day event monitor or an implantable loop recorder.  Risks, benefits, and alteratives to implantable loop recorder were discussed with the patient today.   At this time, the patient is very clear in their decision to proceed with implantable loop recorder.   Wound care was reviewed with the patient (keep incision clean and dry for 3 days).  Wound check will  be scheduled for the patient  Please call with questions.   Renee Dyane Dustman, PA-C 07/31/2016  EP Attending  Patient seen and examined. Agree with the findings as noted above. The patient presents for ILR insertion as she has had a cryptogenic stroke. The risk/benefits/goals/expectations of the procedure have been reviewed and she wishes to proceed.  Mikle Bosworth.D.

## 2016-08-01 ENCOUNTER — Telehealth: Payer: Self-pay

## 2016-08-01 ENCOUNTER — Encounter (HOSPITAL_COMMUNITY): Payer: Self-pay | Admitting: Internal Medicine

## 2016-08-01 MED ORDER — CLOPIDOGREL BISULFATE 75 MG PO TABS: 75.0000 mg | ORAL_TABLET | Freq: Every day | ORAL | 2 refills | Status: DC

## 2016-08-01 MED ORDER — ATORVASTATIN CALCIUM 40 MG PO TABS: 40.0000 mg | ORAL_TABLET | Freq: Every day | ORAL | 2 refills | Status: DC

## 2016-08-01 NOTE — Telephone Encounter (Addendum)
I have made the 1st attempt to contact the patient or family member in charge, in order to follow up from recently being discharged from the hospital. I left a message on voicemail but I will make another attempt at a different time.   Transition Care Management Follow-Up Telephone Call   Date discharged and where: [facility] @ 07/31/2016  How have you been since you were released from the hospital? Much better, using walker but still feeling a bit weak  Any patient concerns? No  Items Reviewed:   Meds: Y  Allergies: Y  Dietary Changes Reviewed:  Y  Functional Questionnaire:  Independent-I Dependent-D  ADLs:   Dressing- I    Eating- I   Maintaining continence- I   Transferring- I w/ walker   Transportation- D   Meal Prep- D   Managing Meds- D  Confirmed importance and Date/Time of follow-up visits scheduled: Yes, Sherrie Mustache, NP 5/15 @9am    Confirmed with patient if condition worsens to call PCP or go to the Emergency Dept. Patient was given office number and encouraged to call back with questions or concerns: Darreld Mclean

## 2016-08-08 ENCOUNTER — Ambulatory Visit: Payer: Medicare Other | Attending: Internal Medicine | Admitting: Occupational Therapy

## 2016-08-08 DIAGNOSIS — R41840 Attention and concentration deficit: Secondary | ICD-10-CM | POA: Diagnosis not present

## 2016-08-08 DIAGNOSIS — R2689 Other abnormalities of gait and mobility: Secondary | ICD-10-CM | POA: Diagnosis not present

## 2016-08-08 DIAGNOSIS — R278 Other lack of coordination: Secondary | ICD-10-CM | POA: Insufficient documentation

## 2016-08-08 DIAGNOSIS — R2681 Unsteadiness on feet: Secondary | ICD-10-CM | POA: Insufficient documentation

## 2016-08-08 DIAGNOSIS — R41844 Frontal lobe and executive function deficit: Secondary | ICD-10-CM | POA: Diagnosis not present

## 2016-08-08 DIAGNOSIS — M6281 Muscle weakness (generalized): Secondary | ICD-10-CM | POA: Diagnosis not present

## 2016-08-08 NOTE — Therapy (Signed)
[facility] [address] [place], [place], [address] Phone: [phone]   Fax:  [phone]  Occupational Therapy Evaluation  Patient Details  Name: Penny Miller MRN: [id number] Date of Birth: [birth year] Referring Provider: Dr. Antony Contras ((referred by hospitalist but will be followed by Leonie Man))  Encounter Date: 08/08/2016      OT End of Session - 08/08/16 1210    Visit Number 1   Number of Visits 20   Date for OT Re-Evaluation 08/08/16   Authorization Type MCR - G code needed   Authorization - Visit Number 1   Authorization - Number of Visits 10   OT Start Time 1100   OT Stop Time 1150   OT Time Calculation (min) 50 min   Activity Tolerance Patient tolerated treatment well      Past Medical History:  Diagnosis Date  . Allergic rhinitis, cause unspecified   . Anxiety state, unspecified   . Diaphragmatic hernia without mention of obstruction or gangrene   . Diverticulosis of colon (without mention of hemorrhage)   . Female stress incontinence   . Insomnia, unspecified   . Interstitial cystitis    surgery 02/01/2013  . Irritable bowel syndrome   . Memory loss   . Other and unspecified hyperlipidemia   . Other premature beats   . Polyneuropathy in diabetes(357.2)   . Postmenopausal bleeding   . Proteinuria   . 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 Unspecified essential hypertension   . Viral warts, unspecified     Past Surgical History:  Procedure Laterality Date  . ABDOMINAL HYSTERECTOMY  1977   Dr.Hambright  . APPENDECTOMY  1965  . CATARACT EXTRACTION, BILATERAL  02/10/2006   Dr.Groat   . COLONOSCOPY  07/17/2004   polypectomy  . [place]  . LOOP RECORDER INSERTION N/A 07/31/2016   Procedure: Loop Recorder Insertion;  Surgeon: [name], MD;  Location: [facility];  Service:  Cardiovascular;  Laterality: N/A;  . TEE WITHOUT CARDIOVERSION N/A 07/30/2016   Procedure: TRANSESOPHAGEAL ECHOCARDIOGRAM (TEE);  Surgeon: [name], MD;  Location: [facility];  Service: Cardiovascular;  Laterality: N/A;  . [place]  . TRIGGER FINGER RELEASE  05/13/16  . urinary bladder      surgery 02/01/2013 to stretch bladder stem    There were no vitals filed for this visit.      Subjective Assessment - 08/08/16 1059    Patient is accompained by: Family member  Granddaughter   Pertinent History Lt cerebellar stroke 07/28/16, TIA 07/08/16. PMH: HTN, DM2, h/o depression, kidney dz (stage II), dementia, ? A-fib, orthostatic hypotension, cataract sx   Limitations subcutaneous loop recorder, fall risk, HOH   Patient Stated Goals To get my Lt arm stronger and stay home during day I'ly   Currently in Pain? No/denies           [facility] OT Assessment - 08/08/16 0001      Assessment   Diagnosis Lt cerebellar stroke   Referring Provider Dr. Antony Contras  (referred by hospitalist but will be followed by Leonie Man)   Onset Date 07/28/16   Assessment walks w/ FWW   Prior Therapy outpatient for Rt long finger trigger finger, acute therapy for CVA     Precautions   Precautions Fall  Subcutaneous loop recorder, no driving   Precaution Comments Pt also [facility]  Restrictions   Weight Bearing Restrictions No     Balance Screen   Has the patient fallen in the past 6 months Yes   How many times? 1  07/08/16 - TIA   Has the patient had a decrease in activity level because of a fear of falling?  No   Is the patient reluctant to leave their home because of a fear of falling?  No     Home  Environment   Bathroom Shower/Tub Tub/Shower unit;Door   Home Equipment Bedside commode;Walker - 2 wheels  BSC for shower   Additional Comments Pt lives in 1 story home with 5 steps to enter. Son lives with patient but works, granddaughter with her during day   Lives With  Son     Prior Function   Level of [place] driving. Son has done yardwork for about 1 year   Vocation Retired     ADL   Eating/Feeding Independent   Grooming Independent   Upper Body Bathing Supervision/safety  seated in shower   Lower Body Bathing Supervision/safety  seated in shower   [place] independent  seated   [place] independent   Toileting - Water engineer -  Aeronautical engineer --  [place] Completely unable to shop   Light Housekeeping Performs light daily tasks such as dishwashing, bed making  Has started folding some laundry   Meal Prep Able to complete simple warm meal prep;Able to complete simple cold meal and snack prep  Pt has used oven to make biscuits but currently not safe    Community Mobility Relies on family or friends for transportation   Medication Management Takes responsibility if medication is prepared in advance in seperate dosage;Has difficulty remembering to take medication  family reports remembering, but they often give to her from    Financial Management Dependent  but pt did prior to stroke     Mobility   Mobility Status Needs assist   Mobility Status Comments FWW     Written Expression   Dominant Hand Right   Handwriting --  Slightly decr. d/t vision and dizziness     Vision - History   Baseline Vision Wears glasses only for reading   Visual History Cataracts  cataract surgery for Bil eyes (possible macular degeneration     Vision Assessment   Eye Alignment Within Functional Limits   Ocular Range of Motion Within Functional Limits   Tracking/Visual Pursuits Able to track stimulus in all quads without difficulty  family reports initially impaired Lt eye   Comment diplopia resolving     Cognition   Overall Cognitive  Status History of cognitive impairments - at baseline  very mild dementia (family reports word finding prior to CVA     Sensation   Light Touch Appears Intact     Coordination   Finger Nose Finger Test intact   9 Hole Peg Test Right;Left   Right 9 Hole Peg Test 25.25 sec   Left 9 Hole Peg Test 32.00 sec     Edema   Edema None except mild joint swelling bilateral hands from OA     ROM / Strength   AROM / PROM / Strength AROM     AROM   Overall AROM Comments BUE AROM WNL's  Hand Function   Right Hand Grip (lbs) 40 lbs (weaker d/t trigger finger Rt long)   Left Hand Grip (lbs) 47 lbs                         OT Education - 08/08/16 1209    Education provided Yes   Education Details Recommended direct supervision for cooking, discussion re: life alert and possible A.C.E. need   Person(s) Educated Patient;Caregiver(s)   Methods Explanation   Comprehension Verbalized understanding          OT Short Term Goals - 08/08/16 1243      OT SHORT TERM GOAL #1   Title Independent with coordination HEP (DUE 08/29/16)   Time 3   Period Weeks   Status New     OT SHORT TERM GOAL #2   Title Id and verbalize understanding with potential DME and A/E needs for incr. safety and independence with ADLS   Time 3   Period Weeks   Status New     OT SHORT TERM GOAL #3   Title Pt/family to verbalize understanding of memory compensatory strategies and how to implement   Time 3   Period Weeks   Status New     OT SHORT TERM GOAL #4   Title Pt to consistently perform simple IADL task with countertop support prn (folding laundry, washing dishes) safely mod I level   Time 3   Period Weeks   Status New     OT SHORT TERM GOAL #5   Title Pt to consistently perform snack prep, cold meal, and microwave meals safely using DME prn at mod I level   Time 3   Period Weeks   Status New           OT Long Term Goals - 08/08/16 1250      OT LONG TERM GOAL #1   Title  Independent with updated HEP prn (due 09/19/16)   Time 6   Period Weeks   Status New     OT LONG TERM GOAL #2   Title Pt to return to stovetop and oven cooking at supervision level for safety   Time 6   Period Weeks   Status New     OT LONG TERM GOAL #3   Title Pt to return to home maintenance tasks at sup level for safety   Time 6   Period Weeks   Status New     OT LONG TERM GOAL #4   Title Pt to perform financial management task with supervision and 90% or greater accuracy   Time 6   Period Weeks   Status New     OT LONG TERM GOAL #5   Title Pt/family to id necessary support required for patient to stay home alone during day and/or have supervision prn   Time 6   Period Weeks   Status New               Plan - 08/08/16 1231    Clinical Impression Statement Pt is a 83 y.o. female who presents to outpatient rehab s/p cerebellar stroke on 07/28/16 affecting Lt side. Pt had TIA 07/08/16. Pt has Lt vertebral aneurysm, subcutaneous loop recorder, orthostatic hypotension. Pt also has Rt long trigger finger (premorbid). Pt presents with decreased balance, mildly impaired coordination, and memory deficits.    OT Frequency 3x / week  (initially begin 3x/wk FOR 3 weeks if schedule allows d/t family possibly unable  to bring pt beyond 3 weeks)   OT Duration 4 weeks  then decr. to 2x/wk for 3 weeks   OT Treatment/Interventions Self-care/ADL training;DME and/or AE instruction;Patient/family education;Splinting;Therapeutic exercises;Therapeutic activities;Neuromuscular education;Functional Mobility Training;Passive range of motion;Cognitive remediation/compensation;Visual/perceptual remediation/compensation;Manual Therapy   Plan give A.C.E. info, walker tray info prn, walker safety in kitchen/walker negotiation, coordination HEP    Consulted and Agree with Plan of Care Patient;Family member/caregiver   Family Member Consulted Granddaughter      Patient will benefit from skilled  therapeutic intervention in order to improve the following deficits and impairments:  Decreased coordination, Decreased safety awareness, Decreased knowledge of precautions, Impaired UE functional use, Decreased knowledge of use of DME, Decreased balance, Decreased cognition, Decreased mobility, Impaired vision/preception  Visit Diagnosis: Other lack of coordination - Plan: Ot plan of care cert/re-cert  Attention and concentration deficit - Plan: Ot plan of care cert/re-cert  Frontal lobe and executive function deficit - Plan: Ot plan of care cert/re-cert  Unsteadiness on feet - Plan: Ot plan of care cert/re-cert      G-Codes - 37/48/27 1520    Functional Assessment Tool Used (Outpatient only) clinical judgement as pt is fall risk and requires supervision   Functional Limitation Mobility: Walking and moving around   Mobility: Walking and Moving Around Current Status (M7867) At least 20 percent but less than 40 percent impaired, limited or restricted   Mobility: Walking and Moving Around Goal Status (J4492) At least 1 percent but less than 20 percent impaired, limited or restricted      Problem List Patient Active Problem List   Diagnosis Date Noted  . CVA (cerebral vascular accident) ([place]) 07/29/2016  . CKD (chronic kidney disease) stage 2, GFR 60-89 ml/min 05/08/2016  . Palpitations 06/27/2015  . Dyspnea 06/27/2015  . Trigger finger 06/27/2015  . History of fall 02/28/2015  . DM (diabetes mellitus), type 2 with renal complications ([place]) 01/00/7121  . Sinusitis, chronic 06/22/2014  . Varicose vein of leg 12/21/2013  . Seborrheic keratosis 12/21/2013  . Deaf 06/15/2013  . Bladder infection, chronic 01/04/2013  . 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 Polyneuropathy in diabetes(357.2)   . Memory loss   . Insomnia, unspecified   . Essential hypertension   . Lipoma of unspecified site   .  Hyperlipidemia   . Pain in joint, ankle and foot 11/20/2012  . Herpes zoster infection of thoracic region 08/28/2012  . Chronic interstitial cystitis     Carey Bullocks, OTR/L 08/08/2016, 3:22 PM  [facility] [address] [place], [place], [address] Phone: [phone]   Fax:  [phone]  Name: Penny Miller MRN: [id number] Date of Birth: 1931-12-03

## 2016-08-09 ENCOUNTER — Ambulatory Visit: Payer: Medicare Other

## 2016-08-09 DIAGNOSIS — R278 Other lack of coordination: Secondary | ICD-10-CM

## 2016-08-09 DIAGNOSIS — R2681 Unsteadiness on feet: Secondary | ICD-10-CM | POA: Diagnosis not present

## 2016-08-09 DIAGNOSIS — R41840 Attention and concentration deficit: Secondary | ICD-10-CM | POA: Diagnosis not present

## 2016-08-09 DIAGNOSIS — R41844 Frontal lobe and executive function deficit: Secondary | ICD-10-CM | POA: Diagnosis not present

## 2016-08-09 DIAGNOSIS — M6281 Muscle weakness (generalized): Secondary | ICD-10-CM | POA: Diagnosis not present

## 2016-08-09 DIAGNOSIS — R2689 Other abnormalities of gait and mobility: Secondary | ICD-10-CM | POA: Diagnosis not present

## 2016-08-09 NOTE — Therapy (Signed)
[facility] [address] [place], [place], [address] Phone: [phone]   Fax:  [phone]  Physical Therapy Evaluation  Patient Details  Name: Penny Miller MRN: [id number] Date of Birth: September 25, 1931 Referring Provider: Dr. Maryland Pink  Encounter Date: 08/09/2016      PT End of Session - 08/09/16 1618    Visit Number 1   Number of Visits 17   Date for PT Re-Evaluation 10/08/16   Authorization Type Medicare and UHC: G-CODE AND PROGRESS NOTE EVERY 10TH VISIT.    PT Start Time 1401   PT Stop Time 1450   PT Time Calculation (min) 49 min   Equipment Utilized During Treatment Gait belt   Activity Tolerance Patient tolerated treatment well   Behavior During Therapy WFL for tasks assessed/performed      Past Medical History:  Diagnosis Date  . Allergic rhinitis, cause unspecified   . Anxiety state, unspecified   . Diaphragmatic hernia without mention of obstruction or gangrene   . Diverticulosis of colon (without mention of hemorrhage)   . Female stress incontinence   . Insomnia, unspecified   . Interstitial cystitis    surgery 02/01/2013  . Irritable bowel syndrome   . Memory loss   . Other and unspecified hyperlipidemia   . Other premature beats   . Polyneuropathy in diabetes(357.2)   . Postmenopausal bleeding   . Proteinuria   . 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 Unspecified essential hypertension   . Viral warts, unspecified     Past Surgical History:  Procedure Laterality Date  . ABDOMINAL HYSTERECTOMY  1977   Dr.Hambright  . APPENDECTOMY  1965  . CATARACT EXTRACTION, BILATERAL  02/10/2006   Dr.Groat   . COLONOSCOPY  07/17/2004   polypectomy  . [place]  . LOOP RECORDER INSERTION N/A 07/31/2016   Procedure: Loop Recorder Insertion;  Surgeon: [name], MD;  Location: [facility];   Service: Cardiovascular;  Laterality: N/A;  . TEE WITHOUT CARDIOVERSION N/A 07/30/2016   Procedure: TRANSESOPHAGEAL ECHOCARDIOGRAM (TEE);  Surgeon: [name], MD;  Location: [facility];  Service: Cardiovascular;  Laterality: N/A;  . [place]  . TRIGGER FINGER RELEASE  05/13/16  . urinary bladder      surgery 02/01/2013 to stretch bladder stem    There were no vitals filed for this visit.       Subjective Assessment - 08/09/16 1410    Subjective Pt had a L cerebellar stroke on 07/28/16 and reported she felt woozy and nauseated during stroke. Prior to stroke pt fell on 07/08/16 due to orthostatic hypotension, but after 07/28/16 stroke the MD determined 07/08/16 episode was likely a TIA. Leading up to the stroke pt experienced dizziness often and not feeling good.  Pt requires S while in the shower 2/2 fatigue and SOB. Pt reports LLE occasionally feels N/T. Pt has hx of intermittent N/T in B feet 2/2 diabetic neuropathy. Pt amb. without an AD prior to CVA.    Patient is accompained by: Family member  Penny Miller-granddaughter   Pertinent History HTN, DM, L vertebral aneurym, dementia, CKD stage II, hyperlipidemia, diabetic neuropathy, incontinence   Patient Stated Goals "Get back to where I was"   Currently in Pain? No/denies            [facility] PT Assessment - 08/09/16 1418  Assessment   Medical Diagnosis Cerebrovascular accident (CVA) due to thrombosis of precerebral artery    Referring Provider Dr. Maryland Pink   Onset Date/Surgical Date 07/28/16   Hand Dominance Right   Prior Therapy Acute care PT     Precautions   Precautions Fall   Precaution Comments Pt also HOH     Restrictions   Weight Bearing Restrictions No     Balance Screen   Has the patient fallen in the past 6 months Yes   How many times? 1   Has the patient had a decrease in activity level because of a fear of falling?  Yes   Is the patient reluctant to leave their home because of a  fear of falling?  Yes     [place] residence   [place]  her son lives with her but works 12 hours/day   Available Help at Discharge Family   Type of [place] to enter   Entrance Stairs-Number of Steps 5   Entrance Stairs-Rails Left  with wall on right side   Home Layout One level   [place] - 2 wheels;Bedside commode;Cane - single point  in the process of installing grab bars     Prior Function   Level of Independence Independent   Vocation Retired   Leisure Work outside (was mowing her lawn), gardening     Cognition   Overall Cognitive Status History of cognitive impairments - at baseline  per pt and family, she has "a touch of dementia"     Sensation   Light Touch Impaired by gross assessment   Additional Comments Pt reports decr. light touch in L lower LE.      Coordination   Gross Motor Movements are Fluid and Coordinated No   Fine Motor Movements are Fluid and Coordinated No   Coordination and Movement Description Decr. speed during L heel to shin and finger to chin. RAMs WNL.     Posture/Postural Control   Posture/Postural Control Postural limitations   Postural Limitations Forward head     Tone   Assessment Location Left Lower Extremity;Right Lower Extremity     ROM / Strength   AROM / PROM / Strength AROM;Strength     AROM   Overall AROM  Within functional limits for tasks performed   Overall AROM Comments BLE AROM WNL     Strength   Overall Strength Deficits   Overall Strength Comments B hip flex: 3+/5, knee ext: 4/5, knee flex: 4/5, ankle DF: 4/5. Seated hip abd/add: 4/5.     Transfers   Transfers Sit to Stand;Stand to Sit     Ambulation/Gait   Ambulation/Gait Yes   Ambulation/Gait Assistance 5: Supervision   Ambulation/Gait Assistance Details B genu valgus   Ambulation Distance (Feet) 100 Feet   Assistive device Rolling walker   Gait Pattern  Step-through pattern;Decreased stride length;Lateral trunk lean to left   Ambulation Surface Level;Indoor   Gait velocity 2.22ft/sec.  with RW     Standardized Balance Assessment   Standardized Balance Assessment Dynamic Gait Index;Timed Up and Go Test     Dynamic Gait Index   Level Surface Mild Impairment   Change in Gait Speed Mild Impairment   Gait with Horizontal Head Turns Mild Impairment   Gait with Vertical Head Turns Mild Impairment   Gait and Pivot Turn Moderate Impairment   Step Over Obstacle Mild Impairment   Step Around Obstacles  Mild Impairment   Steps Mild Impairment   Total Score 15     Timed Up and Go Test   TUG Normal TUG   Normal TUG (seconds) 11.9  no AD     RLE Tone   RLE Tone Within Functional Limits     LLE Tone   LLE Tone Within Functional Limits                           PT Education - 08/09/16 1616    Education provided Yes   Education Details PT discussed outcome measures, PT frequency/durations and POC. PT educated pt that we will establish an HEP next session to improve deficits and that activity is good for her, as she was concerned about performing too much activity.    Person(s) Educated Patient;Child(ren)  grandchild   Methods Explanation   Comprehension Verbalized understanding          PT Short Term Goals - 08/09/16 1624      PT SHORT TERM GOAL #1   Title Pt will verbalize understanding of CVA risk factors and signs/symptoms to decr. risk of additional CVA. TARGET DATE FOR ALL STGS: 09/06/16   Status New     PT SHORT TERM GOAL #2   Title Pt will improve DGI score to >/=17/24 to decr. falls risk.    Status New     PT SHORT TERM GOAL #3   Title Pt will perform 10MWT without an AD with a gait speed of >/=2.68ft/sec. to safely amb. in the community without an AD.   Status New     PT SHORT TERM GOAL #4   Title Pt will amb. 500' over even terrain without an AD to improve functional mobility.    Status New            PT Long Term Goals - 08/09/16 1626      PT LONG TERM GOAL #1   Title Pt will perform HEP IND in order to improve strength, balance, endurance, and flexibility. TARGET DATE FOR ALL LTGS: 10/04/16   Status New     PT LONG TERM GOAL #2   Title Pt will amb. 1000' over uneven terrain, IND, to improve functional mobility and perform yardwork safely.   Status New     PT LONG TERM GOAL #3   Title Perform 6MWT and write goal as indicated.    Status New     PT LONG TERM GOAL #4   Title Pt will improve DGI score to >/=20/24 to decr. falls risk.    Status New               Plan - 08/09/16 1619    Clinical Impression Statement Pt is a pleasant 83y/o female presenting to OPPT neuro s/p L cerebellar stroke on 07/28/16. Pt's PMH significant for the following: HTN, DM, L vertebral aneurym, dementia, CKD stage II, hyperlipidemia, diabetic neuropathy, incontinence. The following deficits were found during exam: gait deviations, impaired strength, impaired balance, decr. endurance, dizziness, impaired coordination and impaired sensation. Pt's DGI score indicates pt is at risk for falls. Pt's TUG time was WNL but pt required S to ensure safety during turn 2/2 incr. postural sway. Pt's gait speed with RW indicates pt is able to safely amb. in the community with RW. Pt would benefit from skilled PT to improve safety during functional mobility.    Rehab Potential Good   Clinical Impairments Affecting Rehab Potential dementia  PT Frequency 2x / week   PT Duration 8 weeks   PT Treatment/Interventions ADLs/Self Care Home Management;Biofeedback;Canalith Repostioning;Balance training;Therapeutic exercise;Neuromuscular re-education;Cognitive remediation;Therapeutic activities;Manual techniques;Functional mobility training;Stair training;Gait training;DME Instruction;Orthotic Fit/Training;Patient/family education;Vestibular   PT Next Visit Plan Perform 6MWT and write goal as indicated. Establish HEP  (strengthening, balance, walking program, flexibility). Provide CVA ed handout.   Consulted and Agree with Plan of Care Family member/caregiver;Patient   Family Member Consulted granddaughter: Penny Miller      Patient will benefit from skilled therapeutic intervention in order to improve the following deficits and impairments:  Abnormal gait, Decreased endurance, Decreased knowledge of use of DME, Decreased strength, Impaired sensation, Decreased balance, Decreased mobility, Decreased cognition, Dizziness, Impaired flexibility, Decreased coordination  Visit Diagnosis: Other abnormalities of gait and mobility - Plan: PT plan of care cert/re-cert  Other lack of coordination - Plan: PT plan of care cert/re-cert  Muscle weakness (generalized) - Plan: PT plan of care cert/re-cert      G-Codes - 62/69/48 1629    Functional Assessment Tool Used (Outpatient Only) DGI: 15/24; gait speed with RW: 2.35ft/sec.; TUG without AD: 11.9sec.    Functional Limitation Mobility: Walking and moving around   Mobility: Walking and Moving Around Current Status [phone]) At least 40 percent but less than 60 percent impaired, limited or restricted   Mobility: Walking and Moving Around Goal Status [phone]) At least 1 percent but less than 20 percent impaired, limited or restricted       Problem List Patient Active Problem List   Diagnosis Date Noted  . CVA (cerebral vascular accident) ([place]) 07/29/2016  . CKD (chronic kidney disease) stage 2, GFR 60-89 ml/min 05/08/2016  . Palpitations 06/27/2015  . Dyspnea 06/27/2015  . Trigger finger 06/27/2015  . History of fall 02/28/2015  . DM (diabetes mellitus), type 2 with renal complications ([place]) 93/81/8299  . Sinusitis, chronic 06/22/2014  . Varicose vein of leg 12/21/2013  . Seborrheic keratosis 12/21/2013  . Deaf 06/15/2013  . Bladder infection, chronic 01/04/2013  . Type II or unspecified type diabetes mellitus with neurological manifestations, not stated as  uncontrolled(250.60)   . Polyneuropathy in diabetes(357.2)   . Memory loss   . Insomnia, unspecified   . Essential hypertension   . Lipoma of unspecified site   . Hyperlipidemia   . Pain in joint, ankle and foot 11/20/2012  . Herpes zoster infection of thoracic region 08/28/2012  . Chronic interstitial cystitis     Daisuke Bailey L 08/09/2016, 4:31 PM  [facility] [address] [place], [place], [address] Phone: [phone]   Fax:  [phone]  Name: Penny Miller MRN: [id number] Date of Birth: 01-07-1932  Geoffry Paradise, PT,DPT 08/09/16 4:31 PM Phone: [phone] Fax: [phone]

## 2016-08-12 ENCOUNTER — Ambulatory Visit (INDEPENDENT_AMBULATORY_CARE_PROVIDER_SITE_OTHER): Payer: Medicare Other | Admitting: Nurse Practitioner

## 2016-08-12 ENCOUNTER — Encounter: Payer: Self-pay | Admitting: Nurse Practitioner

## 2016-08-12 VITALS — BP 118/68 | HR 62 | Temp 97.6°F | Resp 17 | Ht 66.0 in | Wt 172.0 lb

## 2016-08-12 DIAGNOSIS — N181 Chronic kidney disease, stage 1: Secondary | ICD-10-CM

## 2016-08-12 DIAGNOSIS — E785 Hyperlipidemia, unspecified: Secondary | ICD-10-CM | POA: Diagnosis not present

## 2016-08-12 DIAGNOSIS — I726 Aneurysm of vertebral artery: Secondary | ICD-10-CM | POA: Diagnosis not present

## 2016-08-12 DIAGNOSIS — E1122 Type 2 diabetes mellitus with diabetic chronic kidney disease: Secondary | ICD-10-CM | POA: Diagnosis not present

## 2016-08-12 DIAGNOSIS — I10 Essential (primary) hypertension: Secondary | ICD-10-CM | POA: Diagnosis not present

## 2016-08-12 DIAGNOSIS — R413 Other amnesia: Secondary | ICD-10-CM

## 2016-08-12 DIAGNOSIS — I6930 Unspecified sequelae of cerebral infarction: Secondary | ICD-10-CM

## 2016-08-12 MED ORDER — CLOPIDOGREL BISULFATE 75 MG PO TABS: 75.0000 mg | ORAL_TABLET | Freq: Every day | ORAL | 1 refills | Status: DC

## 2016-08-12 NOTE — Progress Notes (Signed)
Careteam: Patient Care Team: Estill Dooms, MD as PCP - General (Internal Medicine) Ulice Bold, MD as Referring Physician (Dermatology) Love, Alyson Locket, MD as Consulting Physician (Neurology) Sharyne Peach, MD as Consulting Physician (Ophthalmology) Gean Birchwood, DPM as Consulting Physician (Podiatry) Ronald Lobo, MD as Consulting Physician (Gastroenterology) Ubaldo Glassing Marny Lowenstein, MD (Inactive) as Consulting Physician (Gynecology) Druscilla Brownie, MD as Consulting Physician (Dermatology) Clent Jacks, MD as Consulting Physician (Ophthalmology) Myrlene Broker, MD as Attending Physician (Urology) Minus Breeding, MD as Consulting Physician (Cardiology)  Advanced Directive information Does Patient Have a Medical Advance Directive?: Yes, Type of Advance Directive: Healthcare Power of Attorney  Allergies  Allergen Reactions  . Ceclor [Cefaclor] Other (See Comments)    Unknown reaction  . Codeine Nausea And Vomiting  . Lopid [Gemfibrozil] Other (See Comments)    Unknown reaction  . Metoprolol Nausea And Vomiting  . Morphine And Related Other (See Comments)    Passed out  . Niacin And Related Other (See Comments)    Unknown reaction  . Relafen [Nabumetone] Nausea And Vomiting  . Septra [Sulfamethoxazole-Trimethoprim] Nausea And Vomiting  . Tetracycline Nausea And Vomiting  . Toprol Xl [Metoprolol Tartrate] Nausea And Vomiting    Chief Complaint  Patient presents with  . Transitions Of Care    Pt was admitted to [facility] 4/29 to 5/2 due to CVA.      HPI: Patient is a 83 y.o. female seen in the office today for hospital follow up. She has a past medical history of hypertension, chronic kidney disease stage II, hyperlipidemia on statin, type 2 diabetes, dementia, presented to hospital with left lower extremity weakness and dizziness. MRI showed left cerebellar stroke. She was outside the TPA window as determined by neurology. Pt had previous had a TIA that was  thought to have orthostatic hypotension. 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 This will need to be monitored as outpatient. Patient is currently on Plavix after failing aspirin.  Patient underwent placement of a loop recorder by cardiology. She was discharged with outpatient PT. Granddaughter is able to take her to these appt. She does not know how long she is going to be able to go to these appt. Granddaughter has been with her everyday (she has been having 24/7 supervision) but this is not needed at this time since she has improved.  Pt cont to have weakness of left side with nausea and dizziness however symptoms are improving.   Review of Systems:  Review of Systems  Constitutional: Positive for fatigue. Negative for fever.  HENT: Positive for hearing loss. Negative for rhinorrhea, sinus pressure and trouble swallowing.   Eyes: Positive for visual disturbance (corrective lenses).  Respiratory: Negative for cough, choking, chest tightness and shortness of breath.   Cardiovascular: Negative for chest pain, palpitations and leg swelling.  Gastrointestinal: Negative.   Endocrine:       Diabetic  Genitourinary: Positive for dysuria.       [place]. Nocturia x 3. Chronic interstitial cystitis.  Musculoskeletal: Negative for arthralgias and back pain.  Skin: Negative.   Neurological: Positive for weakness. Negative for dizziness, tremors, seizures and syncope.  Hematological: Negative.   Psychiatric/Behavioral:       Insomnia. Difficulty falling asleep and early morning awakening.    Past Medical History:  Diagnosis Date  . Allergic rhinitis, cause unspecified   . Anxiety state, unspecified   . Diaphragmatic hernia without mention of obstruction or gangrene   .  Diverticulosis of colon (without mention of hemorrhage)   . Female stress incontinence   . Insomnia, unspecified   . Interstitial cystitis    surgery 02/01/2013  . Irritable bowel syndrome   .  Memory loss   . Other and unspecified hyperlipidemia   . Other premature beats   . Polyneuropathy in diabetes(357.2)   . Postmenopausal bleeding   . Proteinuria   . 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 Unspecified essential hypertension   . Viral warts, unspecified    Past Surgical History:  Procedure Laterality Date  . ABDOMINAL HYSTERECTOMY  1977   Dr.Hambright  . APPENDECTOMY  1965  . CATARACT EXTRACTION, BILATERAL  02/10/2006   Dr.Groat   . COLONOSCOPY  07/17/2004   polypectomy  . [place]  . LOOP RECORDER INSERTION N/A 07/31/2016   Procedure: Loop Recorder Insertion;  Surgeon: [name], MD;  Location: [place];  Service: Cardiovascular;  Laterality: N/A;  . TEE WITHOUT CARDIOVERSION N/A 07/30/2016   Procedure: TRANSESOPHAGEAL ECHOCARDIOGRAM (TEE);  Surgeon: [name], MD;  Location: [facility];  Service: Cardiovascular;  Laterality: N/A;  . [place]  . TRIGGER FINGER RELEASE  05/13/16  . urinary bladder      surgery 02/01/2013 to stretch bladder stem   Social History:   reports that she has been smoking.  She has a 15.50 pack-year smoking history. She has never used smokeless tobacco. She reports that she does not drink alcohol or use drugs.  Family History  Problem Relation Age of Onset  . Cancer Mother        pancreatic  . Cancer Father        bladder  . Hypertension Sister   . Diabetes Sister   . Diabetes Brother   . Kidney cancer Brother   . Hypertension Brother   . Diabetes Brother   . Heart disease Brother 67       Myocardial Infarction     Medications: Patient's Medications  New Prescriptions   No medications on file  Previous Medications   ACETAMINOPHEN (TYLENOL) 325 MG TABLET    Take 650 mg by mouth every 6 (six) hours as needed.   ALBUTEROL (PROVENTIL HFA;VENTOLIN HFA) 108 (90 BASE) MCG/ACT  INHALER    Inhale 2 puffs into the lungs every 6 (six) hours as needed for wheezing or shortness of breath.   AMLODIPINE (NORVASC) 2.5 MG TABLET    Take one tablet twice daily for blood pressure   ATENOLOL (TENORMIN) 50 MG TABLET    TAKE 1 TABLET DAILY TO HELPBLOOD PRESSURE   ATORVASTATIN (LIPITOR) 40 MG TABLET    Take 1 tablet (40 mg total) by mouth daily at 6 PM.   CALCIUM CARBONATE (TUMS EX) 750 MG CHEWABLE TABLET    Chew 1 tablet by mouth daily as needed for heartburn.    CITALOPRAM (CELEXA) 10 MG TABLET    TAKE 1/2 TABLET DAILY FOR  DEPRESSION   CLOPIDOGREL (PLAVIX) 75 MG TABLET    Take 1 tablet (75 mg total) by mouth daily.   DEXTROMETHORPHAN-GUAIFENESIN (MUCINEX DM MAXIMUM STRENGTH PO)    Take 30 mLs by mouth daily as needed (congestion).    MECLIZINE (ANTIVERT) 25 MG TABLET    Take 1 tablet (25 mg total) by mouth 3 (three) times daily as needed for dizziness or nausea.   METFORMIN (GLUCOPHAGE) 500 MG TABLET  TAKE 1 TABLET EVERY MORNINGFOR BLOOD SUGAR   NITROFURANTOIN (MACRODANTIN) 100 MG CAPSULE    Take one capsule by mouth once daily for bladder   VITAMIN D, ERGOCALCIFEROL, (DRISDOL) 50000 UNITS CAPS CAPSULE    TAKE 1 CAPSULE WEEKLY FOR  VITAMIN D  Modified Medications   No medications on file  Discontinued Medications   NAPROXEN SODIUM (ALEVE) 220 MG TABLET    Take 220 mg by mouth 2 (two) times daily with a meal.     Physical Exam:  Vitals:   08/12/16 0923  BP: 118/68  Pulse: 62  Resp: 17  Temp: 97.6 F (36.4 C)  TempSrc: Oral  SpO2: 98%  Weight: 172 lb (78 kg)  Height: 5\' 6"  (1.676 m)   Body mass index is 27.76 kg/m.  Physical Exam  Constitutional: She is oriented to person, place, and time. She appears well-nourished. No distress.  HENT:  Head: Normocephalic and atraumatic.  Right Ear: External ear normal.  Left Ear: External ear normal.  Nose: Nose normal.  Mouth/Throat: Oropharynx is clear and moist. No oropharyngeal exudate.  Eyes: Conjunctivae and EOM are  normal. Pupils are equal, round, and reactive to light.  Lens implant in both eyes.  Neck: No JVD present. No tracheal deviation present. No thyromegaly present.  Cardiovascular: Normal rate, regular rhythm, normal heart sounds and intact distal pulses.  Exam reveals no gallop and no friction rub.   No murmur heard. Pulmonary/Chest: Effort normal and breath sounds normal. No respiratory distress.  Abdominal: Soft. Bowel sounds are normal. She exhibits no distension. There is no tenderness.  Musculoskeletal: Normal range of motion. She exhibits no edema or tenderness.  Trigger finger right third finger  Lymphadenopathy:    She has no cervical adenopathy.  Neurological: She is alert and oriented to person, place, and time. She has normal reflexes. She exhibits normal muscle tone. Coordination normal.   12/21/13 MMSE 29/30. Passed clock drawing. 04/06/16 MMSE 29/30. Passed clock drawing  Skin: Skin is warm and dry.  Psychiatric: She has a normal mood and affect. Her behavior is normal. Judgment and thought content normal.   Labs reviewed: Basic Metabolic Panel:  Recent Labs  05/07/16 0841 07/08/16 1505 07/28/16 1917 07/29/16 0414  NA 137 136 133*  --   K 4.4 4.4 4.0  --   CL 104 104 100*  --   CO2 26 24 24   --   GLUCOSE 132* 123* 140*  --   BUN 19 15 15   --   CREATININE 1.22* 1.03* 1.01*  --   CALCIUM 9.7 9.6 9.4  --   TSH  --   --   --  4.432   Liver Function Tests:  Recent Labs  10/30/15 0856 05/07/16 0841  AST 15 16  ALT 14 14  ALKPHOS 75 69  BILITOT 0.5 0.4  PROT 6.2 6.2  ALBUMIN 4.0 3.9   No results for input(s): LIPASE, AMYLASE in the last 8760 hours. No results for input(s): AMMONIA in the last 8760 hours. CBC:  Recent Labs  07/08/16 1505 07/28/16 1917  WBC 11.6* 8.8  NEUTROABS 9.0* 6.3  HGB 13.5 13.0  HCT 40.5 38.8  MCV 95.3 93.7  PLT 177 166   Lipid Panel:  Recent Labs  10/30/15 0856 05/07/16 0841 07/29/16 0414  CHOL 158 151 162  HDL 43* 42*  38*  LDLCALC 70 65 81  TRIG 226* 218* 217*  CHOLHDL 3.7 3.6 4.3   TSH:  Recent Labs  07/29/16 0414  TSH 4.432   A1C: Lab Results  Component Value Date   HGBA1C 6.8 (H) 07/29/2016     Assessment/Plan 1. Late effect of cerebrovascular accident (CVA) -with left sided weakness that has been improving since hospital discharge. Pt currently going to outpatient PT but she does not drive and requires granddaughter to drive her.  Requesting home health due to scheduling conflict and to optimize therapy potential.  conts on plavix 75 mg daily  Loop recorded has been placed and follow up appt on 5/16 -neurology follow up 6/5 - Ambulatory referral to [place] - Ambulatory referral to [facility] to help with home resources life alert.   2. Aneurysm of vertebral artery ([place]) Noted incidental finding on CT   3. Essential hypertension Blood pressure stable on norvasc  4. Hyperlipidemia, unspecified hyperlipidemia type Lipitor was increased to 40 mg daily, to cont at this time, tolerating increase dose without side effects noted.   5. Memory loss Has been stable, granddaughter notes some mild increase in deficit  6. Type 2 diabetes mellitus with stage 1 chronic kidney disease, without long-term current use of insulin (HCC) A1c of 6.8 in April, cont on current regimen to maintain proper blood glucose control.    Carlos American. Harle Battiest  [facility] [phone] 8 am - 5 pm) [phone] (after hours)

## 2016-08-14 ENCOUNTER — Ambulatory Visit: Payer: Medicare Other

## 2016-08-15 ENCOUNTER — Telehealth: Payer: Self-pay

## 2016-08-15 DIAGNOSIS — E785 Hyperlipidemia, unspecified: Secondary | ICD-10-CM | POA: Diagnosis not present

## 2016-08-15 DIAGNOSIS — I726 Aneurysm of vertebral artery: Secondary | ICD-10-CM | POA: Diagnosis not present

## 2016-08-15 DIAGNOSIS — F0390 Unspecified dementia without behavioral disturbance: Secondary | ICD-10-CM | POA: Diagnosis not present

## 2016-08-15 DIAGNOSIS — I69354 Hemiplegia and hemiparesis following cerebral infarction affecting left non-dominant side: Secondary | ICD-10-CM | POA: Diagnosis not present

## 2016-08-15 DIAGNOSIS — N182 Chronic kidney disease, stage 2 (mild): Secondary | ICD-10-CM | POA: Diagnosis not present

## 2016-08-15 DIAGNOSIS — I129 Hypertensive chronic kidney disease with stage 1 through stage 4 chronic kidney disease, or unspecified chronic kidney disease: Secondary | ICD-10-CM | POA: Diagnosis not present

## 2016-08-15 DIAGNOSIS — E1142 Type 2 diabetes mellitus with diabetic polyneuropathy: Secondary | ICD-10-CM | POA: Diagnosis not present

## 2016-08-15 DIAGNOSIS — E1122 Type 2 diabetes mellitus with diabetic chronic kidney disease: Secondary | ICD-10-CM | POA: Diagnosis not present

## 2016-08-15 NOTE — Telephone Encounter (Signed)
Metformin is being use to treat elevated blood sugar to PREVENT further stroke.  As far as her leg pain, can she describe the pain? aggravating factors? Has PT evaluated this yet Does tylenol help at all? She can take 650 mg by mouth every 6 hours safely or she can take 1000 mg (2 of the extra strength or 3 regular) every 8 hours

## 2016-08-15 NOTE — Telephone Encounter (Signed)
Discussed response with [name], [name] scheduled her grandmother an appointment for Monday @ 1:30 with  Dani Gobble. [name] refused appointment for tomorrow with Dr.Carter (to short of a notice). PT was arriving at the time of call.  [name] will have her grandmother continue the metformin until Monday when she can further discuss this with Janett Billow.   [name] was informed if symptoms progress or new symptoms develop patient to seek medical attention at the ER or Urgent Care, [name] agreed.

## 2016-08-15 NOTE — Telephone Encounter (Signed)
Okay; she can also bring information in with her to appt so we can clarify

## 2016-08-15 NOTE — Telephone Encounter (Signed)
This leg pain needs to be evaluated if it has changed significantly since last OV, could be multiple things. Therapy can evaluate and notify us as well.  Also stroke is not a side effect of metformin. She can quit taking medication and we will follow up A1c in 3 months

## 2016-08-15 NOTE — Telephone Encounter (Signed)
Message left on clinical intake voicemail:   Patient's grand-daughter Penny Miller was calling to discuss left leg pain and medications. Patient was seen on Monday by Sherrie Mustache, NP   I returned call to [place]: 1.) Left leg pain, at appointment leg was sore, now leg is painful. Pain is progressively getting worse since appointment. Tylenol is not working, current dose is 650 mg twice daily, ineffective. Patient is afraid to take every 6 hours as directed on bottle.  Patient is using DME ([facility])  2.) Patient's metformin indicates stroke as a possible side effect. Patient is requesting an alternative for she has already had a stoke and does not need anything to increase her risk of another stroke  Please advise

## 2016-08-15 NOTE — Telephone Encounter (Signed)
Elmyra with [facility] called to request verbal orders for balance/gait training twice weekly x 3-4 weeks.  I called back and left message for Elmyra to return call, I was not sure that I have the correct number for her for I listened to the message x 4 and due to the speed of message it was very hard to capture the number (reason information not left on voicemail)

## 2016-08-15 NOTE — Telephone Encounter (Signed)
Spoke with Penny Miller was not clear on response about metformin, [name] asked that I reiterate the question/request. Patient would like an alternative for she reviewed all the possible side effect to metformin and stroke is listed as a side effect of metformin. Please advise on an alternative  PT will see patient today, pain is through the entire left leg worse in the lower calf. Pain is constant and is like a tooth ache (best way to explain), please advise  [name] verbalized understanding of response to Tylenol, and state she will try to convince her grand-mother that its safe to take more tylenol

## 2016-08-16 ENCOUNTER — Ambulatory Visit (INDEPENDENT_AMBULATORY_CARE_PROVIDER_SITE_OTHER): Payer: Medicare Other | Admitting: Internal Medicine

## 2016-08-16 ENCOUNTER — Encounter: Payer: Self-pay | Admitting: Internal Medicine

## 2016-08-16 VITALS — BP 118/70 | HR 57 | Temp 97.7°F | Ht 66.0 in | Wt 169.4 lb

## 2016-08-16 DIAGNOSIS — N181 Chronic kidney disease, stage 1: Secondary | ICD-10-CM

## 2016-08-16 DIAGNOSIS — E1122 Type 2 diabetes mellitus with diabetic chronic kidney disease: Secondary | ICD-10-CM

## 2016-08-16 DIAGNOSIS — I6930 Unspecified sequelae of cerebral infarction: Secondary | ICD-10-CM | POA: Diagnosis not present

## 2016-08-16 DIAGNOSIS — M7062 Trochanteric bursitis, left hip: Secondary | ICD-10-CM | POA: Diagnosis not present

## 2016-08-16 DIAGNOSIS — G6289 Other specified polyneuropathies: Secondary | ICD-10-CM | POA: Diagnosis not present

## 2016-08-16 DIAGNOSIS — M79605 Pain in left leg: Secondary | ICD-10-CM | POA: Diagnosis not present

## 2016-08-16 DIAGNOSIS — I639 Cerebral infarction, unspecified: Secondary | ICD-10-CM

## 2016-08-16 MED ORDER — ZOSTER VAC RECOMB ADJUVANTED 50 MCG/0.5ML IM SUSR: 0.5000 mL | Freq: Once | INTRAMUSCULAR | 1 refills | Status: AC

## 2016-08-16 MED ORDER — METHYLPREDNISOLONE ACETATE 40 MG/ML IJ SUSP
40.0000 mg | Freq: Once | INTRAMUSCULAR | Status: AC
  Administered 2016-08-16: 40 mg via INTRAMUSCULAR

## 2016-08-16 MED ORDER — ZOSTER VAC RECOMB ADJUVANTED 50 MCG/0.5ML IM SUSR: 0.5000 mL | Freq: Once | INTRAMUSCULAR | 0 refills | Status: DC

## 2016-08-16 MED ORDER — GABAPENTIN 100 MG PO CAPS: 100.0000 mg | ORAL_CAPSULE | Freq: Every day | ORAL | 3 refills | Status: DC

## 2016-08-16 NOTE — Progress Notes (Signed)
Patient ID: Penny Miller, female   DOB: 1931-04-18, 83 y.o.   MRN: [id number]    Location:  [facility] Place of Service: OFFICE  Chief Complaint  Patient presents with  . Leg Pain    patient states that she has pain in her left leg the pain is so bad it makes her sick on her stomach started two days ago. She tried Tylenol but it does not help.   . Medication Management    questions about metformin    HPI:  83 yo female seen today for LLE pain. She was hospitalized in April following acute CVA with left hemiparesis. She was tolerating PT until last week when she began to have pain into her left calf. She is unable to bear weight without pain. Leg feels "heavy" and throbs. Pain begins in left lower back-->foot. She previously took naproxen for pain but was dc/d while in hospital. She now takes Tylenol and it only causes fatigue.  She has allergy to codeine. She has a hx diabetic neuropathy. She is a poor historian due to memory loss. Hx obtained from chart and granddaughter  DM - controlled. A1c 6.8%  Past Medical History:  Diagnosis Date  . Allergic rhinitis, cause unspecified   . Anxiety state, unspecified   . Diaphragmatic hernia without mention of obstruction or gangrene   . Diverticulosis of colon (without mention of hemorrhage)   . Female stress incontinence   . Insomnia, unspecified   . Interstitial cystitis    surgery 02/01/2013  . Irritable bowel syndrome   . Memory loss   . Other and unspecified hyperlipidemia   . Other premature beats   . Polyneuropathy in diabetes(357.2)   . Postmenopausal bleeding   . Proteinuria   . 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 Unspecified essential hypertension   . Viral warts, unspecified     Past Surgical History:  Procedure Laterality Date  . ABDOMINAL HYSTERECTOMY  1977   Dr.Hambright  . APPENDECTOMY  1965  . CATARACT EXTRACTION, BILATERAL  02/10/2006   Dr.Groat   . COLONOSCOPY  07/17/2004   polypectomy  . [place]  . LOOP RECORDER INSERTION N/A 07/31/2016   Procedure: Loop Recorder Insertion;  Surgeon: [name], MD;  Location: [facility];  Service: Cardiovascular;  Laterality: N/A;  . TEE WITHOUT CARDIOVERSION N/A 07/30/2016   Procedure: TRANSESOPHAGEAL ECHOCARDIOGRAM (TEE);  Surgeon: [name], MD;  Location: [facility];  Service: Cardiovascular;  Laterality: N/A;  . [place]  . TRIGGER FINGER RELEASE  05/13/16  . urinary bladder      surgery 02/01/2013 to stretch bladder stem    Patient Care Team: Estill Dooms, MD as PCP - General (Internal Medicine) Ulice Bold, MD as Referring Physician (Dermatology) Love, Alyson Locket, MD as Consulting Physician (Neurology) Sharyne Peach, MD as Consulting Physician (Ophthalmology) Gean Birchwood, DPM as Consulting Physician (Podiatry) Ronald Lobo, MD as Consulting Physician (Gastroenterology) Ubaldo Glassing Marny Lowenstein, MD (Inactive) as Consulting Physician (Gynecology) Druscilla Brownie, MD as Consulting Physician (Dermatology) Clent Jacks, MD as Consulting Physician (Ophthalmology) Myrlene Broker, MD as Attending Physician (Urology) Minus Breeding, MD as Consulting Physician (Cardiology)  Social History   Social History  . Marital status: Widowed    Spouse name: N/A  . Number of children: 1  . Years of education: N/A   Occupational History  . Teacher  Piano Teacher    Social History Main Topics  . Smoking status: Current Every Day Smoker    Packs/day: 0.25    Years: 62.00  . Smokeless tobacco: Never Used     Comment: stopped consistently smoking in 2012 but still smokes occasionally (one pack per week)  . Alcohol use No  . Drug use: No  . Sexual activity: No   Other Topics Concern  . Not on file   Social History Narrative   Ophthalmologist-Dr.Gould and Dr.Groat   Podiatrist- Dr.Tuchman    Dermatologist- Dr.Lupton   Urologist- Dr. Lawerance Bach    Lives with her son.         reports that she has been smoking.  She has a 15.50 pack-year smoking history. She has never used smokeless tobacco. She reports that she does not drink alcohol or use drugs.  Family History  Problem Relation Age of Onset  . Cancer Mother        pancreatic  . Cancer Father        bladder  . Hypertension Sister   . Diabetes Sister   . Diabetes Brother   . Kidney cancer Brother   . Hypertension Brother   . Diabetes Brother   . Heart disease Brother 22       Myocardial Infarction    Family Status  Relation Status  . Mother Deceased  . Father Deceased  . Sister Alive  . Brother Deceased       Accidental  . Son Alive  . Brother Deceased       Complications of Diabetes   . Brother Deceased at age 63       MI  . Brother Deceased at age 70       Accidental   . Brother (Not Specified)  . Brother (Not Specified)     Allergies  Allergen Reactions  . Ceclor [Cefaclor] Other (See Comments)    Unknown reaction  . Codeine Nausea And Vomiting  . Lopid [Gemfibrozil] Other (See Comments)    Unknown reaction  . Metoprolol Nausea And Vomiting  . Morphine And Related Other (See Comments)    Passed out  . Niacin And Related Other (See Comments)    Unknown reaction  . Relafen [Nabumetone] Nausea And Vomiting  . Septra [Sulfamethoxazole-Trimethoprim] Nausea And Vomiting  . Tetracycline Nausea And Vomiting  . Toprol Xl [Metoprolol Tartrate] Nausea And Vomiting    Medications: Patient's Medications  New Prescriptions   No medications on file  Previous Medications   ACETAMINOPHEN (TYLENOL) 325 MG TABLET    Take 650 mg by mouth every 6 (six) hours as needed.   ALBUTEROL (PROVENTIL HFA;VENTOLIN HFA) 108 (90 BASE) MCG/ACT INHALER    Inhale 2 puffs into the lungs every 6 (six) hours as needed for wheezing or shortness of breath.   AMLODIPINE (NORVASC) 2.5 MG TABLET    Take one tablet twice daily for  blood pressure   ATENOLOL (TENORMIN) 50 MG TABLET    TAKE 1 TABLET DAILY TO HELPBLOOD PRESSURE   ATORVASTATIN (LIPITOR) 40 MG TABLET    Take 1 tablet (40 mg total) by mouth daily at 6 PM.   CALCIUM CARBONATE (TUMS EX) 750 MG CHEWABLE TABLET    Chew 1 tablet by mouth daily as needed for heartburn.    CITALOPRAM (CELEXA) 10 MG TABLET    TAKE 1/2 TABLET DAILY FOR  DEPRESSION   CLOPIDOGREL (PLAVIX) 75 MG TABLET    Take 1 tablet (75 mg total) by mouth  daily.   DEXTROMETHORPHAN-GUAIFENESIN (MUCINEX DM MAXIMUM STRENGTH PO)    Take 30 mLs by mouth daily as needed (congestion).    MECLIZINE (ANTIVERT) 25 MG TABLET    Take 1 tablet (25 mg total) by mouth 3 (three) times daily as needed for dizziness or nausea.   METFORMIN (GLUCOPHAGE) 500 MG TABLET    TAKE 1 TABLET EVERY MORNINGFOR BLOOD SUGAR   NITROFURANTOIN (MACRODANTIN) 100 MG CAPSULE    Take one capsule by mouth once daily for bladder   VITAMIN D, ERGOCALCIFEROL, (DRISDOL) 50000 UNITS CAPS CAPSULE    TAKE 1 CAPSULE WEEKLY FOR  VITAMIN D  Modified Medications   No medications on file  Discontinued Medications   No medications on file    Review of Systems  Unable to perform ROS: Other (memory loss)    Vitals:   08/16/16 1513  BP: 118/70  Pulse: (!) 57  Temp: 97.7 F (36.5 C)  TempSrc: Oral  SpO2: 97%  Weight: 169 lb 6.4 oz (76.8 kg)  Height: '5\' 6"'  (1.676 m)   Body mass index is 27.34 kg/m.  Physical Exam  Constitutional: She appears well-developed and well-nourished.  Eyes: Pupils are equal, round, and reactive to light. No scleral icterus.  Neck: Neck supple.  Cardiovascular:  Murmur (1/6 SEM) heard. No LE edema b/l. No calf TTP; spider veins left anterior foot  Pulmonary/Chest: She has wheezes (end expiratory right base).  Abdominal: Soft. Bowel sounds are normal. She exhibits no distension and no mass. There is no tenderness. There is no rebound and no guarding.  Musculoskeletal: She exhibits edema, tenderness (left achilles  tendon) and deformity (left calf min atrophy).       Legs: Neurological: She is alert. She displays atrophy and abnormal reflex. She exhibits abnormal muscle tone. Gait (antalgic; uses walker) abnormal.  Reflex Scores:      Tricep reflexes are 2+ on the right side.      Bicep reflexes are 2+ on the right side and 3+ on the left side.      Brachioradialis reflexes are 2+ on the right side and 3+ on the left side.      Patellar reflexes are 2+ on the right side and 3+ on the left side. DTRs +3/2 left knee and left elbow  Skin: Skin is warm and dry.  Psychiatric: She has a normal mood and affect. Her behavior is normal. Her speech is slurred.     Labs reviewed: Admission on 07/28/2016, Discharged on 07/31/2016  Component Date Value Ref Range Status  . WBC 07/28/2016 8.8  4.0 - 10.5 K/uL Final  . RBC 07/28/2016 4.14  3.87 - 5.11 MIL/uL Final  . Hemoglobin 07/28/2016 13.0  12.0 - 15.0 g/dL Final  . HCT 07/28/2016 38.8  36.0 - 46.0 % Final  . MCV 07/28/2016 93.7  78.0 - 100.0 fL Final  . MCH 07/28/2016 31.4  26.0 - 34.0 pg Final  . MCHC 07/28/2016 33.5  30.0 - 36.0 g/dL Final  . RDW 07/28/2016 12.9  11.5 - 15.5 % Final  . Platelets 07/28/2016 166  150 - 400 K/uL Final  . Neutrophils Relative % 07/28/2016 72  % Final  . Neutro Abs 07/28/2016 6.3  1.7 - 7.7 K/uL Final  . Lymphocytes Relative 07/28/2016 20  % Final  . Lymphs Abs 07/28/2016 1.8  0.7 - 4.0 K/uL Final  . Monocytes Relative 07/28/2016 7  % Final  . Monocytes Absolute 07/28/2016 0.7  0.1 - 1.0 K/uL Final  . Eosinophils  Relative 07/28/2016 1  % Final  . Eosinophils Absolute 07/28/2016 0.1  0.0 - 0.7 K/uL Final  . Basophils Relative 07/28/2016 0  % Final  . Basophils Absolute 07/28/2016 0.0  0.0 - 0.1 K/uL Final  . Sodium 07/28/2016 133* 135 - 145 mmol/L Final  . Potassium 07/28/2016 4.0  3.5 - 5.1 mmol/L Final  . Chloride 07/28/2016 100* 101 - 111 mmol/L Final  . CO2 07/28/2016 24  22 - 32 mmol/L Final  . Glucose, Bld  07/28/2016 140* 65 - 99 mg/dL Final  . BUN 07/28/2016 15  6 - 20 mg/dL Final  . Creatinine, Ser 07/28/2016 1.01* 0.44 - 1.00 mg/dL Final  . Calcium 07/28/2016 9.4  8.9 - 10.3 mg/dL Final  . GFR calc non Af Amer 07/28/2016 50* >60 mL/min Final  . GFR calc Af Amer 07/28/2016 58* >60 mL/min Final   Comment: (NOTE) The eGFR has been calculated using the CKD EPI equation. This calculation has not been validated in all clinical situations. eGFR's persistently <60 mL/min signify possible Chronic Kidney Disease.   . Anion gap 07/28/2016 9  5 - 15 Final  . Troponin i, poc 07/28/2016 0.01  0.00 - 0.08 ng/mL Final  . Comment 3 07/28/2016          Final   Comment: Due to the release kinetics of cTnI, a negative result within the first hours of the onset of symptoms does not rule out myocardial infarction with certainty. If myocardial infarction is still suspected, repeat the test at appropriate intervals.   . Color, Urine 07/28/2016 COLORLESS* YELLOW Final  . APPearance 07/28/2016 CLEAR  CLEAR Final  . Specific Gravity, Urine 07/28/2016 1.004* 1.005 - 1.030 Final  . pH 07/28/2016 7.0  5.0 - 8.0 Final  . Glucose, UA 07/28/2016 50* NEGATIVE mg/dL Final  . Hgb urine dipstick 07/28/2016 SMALL* NEGATIVE Final  . Bilirubin Urine 07/28/2016 NEGATIVE  NEGATIVE Final  . Ketones, ur 07/28/2016 NEGATIVE  NEGATIVE mg/dL Final  . Protein, ur 07/28/2016 NEGATIVE  NEGATIVE mg/dL Final  . Nitrite 07/28/2016 NEGATIVE  NEGATIVE Final  . Leukocytes, UA 07/28/2016 NEGATIVE  NEGATIVE Final  . RBC / HPF 07/28/2016 0-5  0 - 5 RBC/hpf Final  . WBC, UA 07/28/2016 0-5  0 - 5 WBC/hpf Final  . Bacteria, UA 07/28/2016 RARE* NONE SEEN Final  . Squamous Epithelial / LPF 07/28/2016 0-5* NONE SEEN Final  . Cholesterol 07/29/2016 162  0 - 200 mg/dL Final  . Triglycerides 07/29/2016 217* <150 mg/dL Final  . HDL 07/29/2016 38* >40 mg/dL Final  . Total CHOL/HDL Ratio 07/29/2016 4.3  RATIO Final  . VLDL 07/29/2016 43* 0 - 40  mg/dL Final  . LDL Cholesterol 07/29/2016 81  0 - 99 mg/dL Final   Comment:        Total Cholesterol/HDL:CHD Risk Coronary Heart Disease Risk Table                     Men   Women  1/2 Average Risk   3.4   3.3  Average Risk       5.0   4.4  2 X Average Risk   9.6   7.1  3 X Average Risk  23.4   11.0        Use the calculated Patient Ratio above and the CHD Risk Table to determine the patient's CHD Risk.        ATP III CLASSIFICATION (LDL):  <100     mg/dL  Optimal  100-129  mg/dL   Near or Above                    Optimal  130-159  mg/dL   Borderline  160-189  mg/dL   High  >190     mg/dL   Very High   . TSH 07/29/2016 4.432  0.350 - 4.500 uIU/mL Final   Performed by a 3rd Generation assay with a functional sensitivity of <=0.01 uIU/mL.  Marland Kitchen Glucose-Capillary 07/29/2016 116* 65 - 99 mg/dL Final  . Hgb A1c MFr Bld 07/29/2016 6.8* 4.8 - 5.6 % Final   Comment: (NOTE)         Pre-diabetes: 5.7 - 6.4         Diabetes: >6.4         Glycemic control for adults with diabetes: <7.0   . Mean Plasma Glucose 07/29/2016 148  mg/dL Final   Comment: (NOTE) Performed At: [facility] [place], [place] [id number] Lindon Romp MD [id number]   . Glucose-Capillary 07/29/2016 183* 65 - 99 mg/dL Final  . Glucose-Capillary 07/29/2016 86  65 - 99 mg/dL Final  . Glucose-Capillary 07/29/2016 194* 65 - 99 mg/dL Final  . Comment 1 07/29/2016 Notify RN   Final  . Comment 2 07/29/2016 Document in Chart   Final  . Glucose-Capillary 07/30/2016 104* 65 - 99 mg/dL Final  . Comment 1 07/30/2016 Notify RN   Final  . Comment 2 07/30/2016 Document in Chart   Final  . Glucose-Capillary 07/30/2016 121* 65 - 99 mg/dL Final  . Comment 1 07/30/2016 Notify RN   Final  . Comment 2 07/30/2016 Document in Chart   Final  . Glucose-Capillary 07/30/2016 133* 65 - 99 mg/dL Final  . Comment 1 07/30/2016 Notify RN   Final  . Comment 2 07/30/2016 Document in Chart   Final  .  Glucose-Capillary 07/30/2016 92  65 - 99 mg/dL Final  . Comment 1 07/30/2016 Notify RN   Final  . Comment 2 07/30/2016 Document in Chart   Final  . Glucose-Capillary 07/30/2016 104* 65 - 99 mg/dL Final  . Comment 1 07/30/2016 Notify RN   Final  . Comment 2 07/30/2016 Document in Chart   Final  . Glucose-Capillary 07/30/2016 147* 65 - 99 mg/dL Final  . Comment 1 07/30/2016 Notify RN   Final  . Comment 2 07/30/2016 Document in Chart   Final  . Glucose-Capillary 07/31/2016 116* 65 - 99 mg/dL Final  . Comment 1 07/31/2016 Notify RN   Final  . Comment 2 07/31/2016 Document in Chart   Final  . Glucose-Capillary 07/31/2016 170* 65 - 99 mg/dL Final  . Glucose-Capillary 07/31/2016 96  65 - 99 mg/dL Final  Admission on 07/08/2016, Discharged on 07/08/2016  Component Date Value Ref Range Status  . Sodium 07/08/2016 136  135 - 145 mmol/L Final  . Potassium 07/08/2016 4.4  3.5 - 5.1 mmol/L Final  . Chloride 07/08/2016 104  101 - 111 mmol/L Final  . CO2 07/08/2016 24  22 - 32 mmol/L Final  . Glucose, Bld 07/08/2016 123* 65 - 99 mg/dL Final  . BUN 07/08/2016 15  6 - 20 mg/dL Final  . Creatinine, Ser 07/08/2016 1.03* 0.44 - 1.00 mg/dL Final  . Calcium 07/08/2016 9.6  8.9 - 10.3 mg/dL Final  . GFR calc non Af Amer 07/08/2016 49* >60 mL/min Final  . GFR calc Af Amer 07/08/2016 57* >60 mL/min Final   Comment: (NOTE) The  eGFR has been calculated using the CKD EPI equation. This calculation has not been validated in all clinical situations. eGFR's persistently <60 mL/min signify possible Chronic Kidney Disease.   . Anion gap 07/08/2016 8  5 - 15 Final  . Color, Urine 07/08/2016 STRAW* YELLOW Final  . APPearance 07/08/2016 CLEAR  CLEAR Final  . Specific Gravity, Urine 07/08/2016 1.009  1.005 - 1.030 Final  . pH 07/08/2016 7.0  5.0 - 8.0 Final  . Glucose, UA 07/08/2016 50* NEGATIVE mg/dL Final  . Hgb urine dipstick 07/08/2016 NEGATIVE  NEGATIVE Final  . Bilirubin Urine 07/08/2016 NEGATIVE  NEGATIVE  Final  . Ketones, ur 07/08/2016 NEGATIVE  NEGATIVE mg/dL Final  . Protein, ur 07/08/2016 30* NEGATIVE mg/dL Final  . Nitrite 07/08/2016 NEGATIVE  NEGATIVE Final  . Leukocytes, UA 07/08/2016 NEGATIVE  NEGATIVE Final  . RBC / HPF 07/08/2016 0-5  0 - 5 RBC/hpf Final  . WBC, UA 07/08/2016 0-5  0 - 5 WBC/hpf Final  . Bacteria, UA 07/08/2016 NONE SEEN  NONE SEEN Final  . Squamous Epithelial / LPF 07/08/2016 NONE SEEN  NONE SEEN Final  . Mucous 07/08/2016 PRESENT   Final  . Glucose-Capillary 07/08/2016 129* 65 - 99 mg/dL Final  . Troponin i, poc 07/08/2016 0.01  0.00 - 0.08 ng/mL Final  . Comment 3 07/08/2016          Final   Comment: Due to the release kinetics of cTnI, a negative result within the first hours of the onset of symptoms does not rule out myocardial infarction with certainty. If myocardial infarction is still suspected, repeat the test at appropriate intervals.   . Prothrombin Time 07/08/2016 13.8  11.4 - 15.2 seconds Final  . INR 07/08/2016 1.06   Final  . aPTT 07/08/2016 32  24 - 36 seconds Final  . WBC 07/08/2016 11.6* 4.0 - 10.5 K/uL Final  . RBC 07/08/2016 4.25  3.87 - 5.11 MIL/uL Final  . Hemoglobin 07/08/2016 13.5  12.0 - 15.0 g/dL Final  . HCT 07/08/2016 40.5  36.0 - 46.0 % Final  . MCV 07/08/2016 95.3  78.0 - 100.0 fL Final  . MCH 07/08/2016 31.8  26.0 - 34.0 pg Final  . MCHC 07/08/2016 33.3  30.0 - 36.0 g/dL Final  . RDW 07/08/2016 13.2  11.5 - 15.5 % Final  . Platelets 07/08/2016 177  150 - 400 K/uL Final  . Neutrophils Relative % 07/08/2016 79  % Final  . Neutro Abs 07/08/2016 9.0* 1.7 - 7.7 K/uL Final  . Lymphocytes Relative 07/08/2016 14  % Final  . Lymphs Abs 07/08/2016 1.7  0.7 - 4.0 K/uL Final  . Monocytes Relative 07/08/2016 6  % Final  . Monocytes Absolute 07/08/2016 0.7  0.1 - 1.0 K/uL Final  . Eosinophils Relative 07/08/2016 1  % Final  . Eosinophils Absolute 07/08/2016 0.1  0.0 - 0.7 K/uL Final  . Basophils Relative 07/08/2016 0  % Final  .  Basophils Absolute 07/08/2016 0.0  0.0 - 0.1 K/uL Final    Ct Angio Head W Or Wo Contrast  Result Date: 07/29/2016 CLINICAL DATA:  83 y/o  F; embolic stroke. EXAM: CT ANGIOGRAPHY HEAD AND NECK TECHNIQUE: Multidetector CT imaging of the head and neck was performed using the standard protocol during bolus administration of intravenous contrast. Multiplanar CT image reconstructions and MIPs were obtained to evaluate the vascular anatomy. Carotid stenosis measurements (when applicable) are obtained utilizing NASCET criteria, using the distal internal carotid diameter as the denominator. CONTRAST:  50 cc Isovue 370 COMPARISON:  07/28/2016 MRI of the brain and CT of the head. FINDINGS: CT HEAD FINDINGS Brain: Focus of hypoattenuation within the left superior cerebellar hemisphere is stable in compatible with infarction. No evidence for new large territory infarct, significant mass effect, or acute intracranial hemorrhage. Stable chronic microvascular ischemic changes of white matter and parenchymal volume loss of the brain. No hydrocephalus or extra-axial collection. Vascular: As below. Skull: Normal. Negative for fracture or focal lesion. Sinuses: 7 mm right frontal sinus osteoma. Small fluid level within the sphenoid sinuses. Orbits: Bilateral intra-ocular lens replacement. Review of the MIP images confirms the above findings CTA NECK FINDINGS Aortic arch: Standard branching. Imaged portion shows no evidence of aneurysm or dissection. No significant stenosis of the major arch vessel origins. Moderate calcific atherosclerosis of the aortic arch. Right carotid system: No evidence of dissection, stenosis (50% or greater) or occlusion. Mild calcific atherosclerosis of the carotid bifurcation without stenosis. Tortuosity and kinking of the mid cervical internal carotid artery. Left carotid system: No evidence of dissection, stenosis (50% or greater) or occlusion.Mild calcific atherosclerosis of the carotid bifurcation  without stenosis. Tortuosity and kinking of the mid cervical internal carotid artery. Vertebral arteries: Left vertebral artery 2 mm superiorly directed outpouching at the C1 level (series 13, image 135) compatible with a small aneurysm. Skeleton: Mild cervical spondylosis with disc and facet degenerative changes greatest at the C4 through C7 levels. No high-grade bony canal stenosis. Other neck: Negative. Upper chest: Few scattered peripheral punctate nodules and mucous plugging of small airway is compatible with minimal bronchiolitis. Review of the MIP images confirms the above findings CTA HEAD FINDINGS Anterior circulation: No significant stenosis, proximal occlusion, aneurysm, or vascular malformation. Mild non stenotic calcific atherosclerosis of cavernous and paraclinoid internal carotid arteries. Posterior circulation: No significant stenosis, proximal occlusion, aneurysm, or vascular malformation. Venous sinuses: As permitted by contrast timing, patent. Anatomic variants: No bilateral fetal PCA with large posterior communicating artery is, diminutive right P1 segment, and no appreciable left P1 segment. Pole small anterior communicating artery. Delayed phase: No abnormal intracranial enhancement. Review of the MIP images confirms the above findings IMPRESSION: 1. Patent carotid and vertebral arteries. No large vessel occlusion of circle of Willis. 2. 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 3. Mild calcific atherosclerosis of carotid bifurcations and of carotid siphons without stenosis. 4. Left superior cerebellar hemisphere area of hypoattenuation compatible with infarction that is stable in comparison with prior studies. 5. No evidence for new large acute infarct, significant mass effect, or acute intracranial hemorrhage. Electronically Signed   By: Kristine Garbe M.D.   On: 07/29/2016 17:36   Dg Chest 2 View  Result Date: 07/29/2016 CLINICAL DATA:   Follow-up TIA EXAM: CHEST  2 VIEW COMPARISON:  02/24/2013 FINDINGS: Lungs are clear. Mild biapical pleural-parenchymal scarring. No pleural effusion or pneumothorax. The heart is normal in size. Visualized osseous structures are within normal limits. IMPRESSION: Normal chest radiographs. Electronically Signed   By: Julian Hy M.D.   On: 07/29/2016 07:29   Ct Head Wo Contrast  Result Date: 07/28/2016 CLINICAL DATA:  Left-sided weakness and dizziness left leg weakness EXAM: CT HEAD WITHOUT CONTRAST TECHNIQUE: Contiguous axial images were obtained from the base of the skull through the vertex without intravenous contrast. COMPARISON:  07/08/2016 FINDINGS: Brain: Focal low attenuation within the left anterior, superior cerebellum, suspicious for a focal area of edema. No hemorrhage or mass. Old lacune in the right basal ganglia.  Moderate periventricular, subcortical and deep white matter small vessel ischemic changes. The ventricles are stable in size. Mild cortical atrophy. Vascular: No hyperdense vessels. Carotid artery calcifications. Mild vertebral artery calcifications. Skull: No fracture or suspicious bone lesion Sinuses/Orbits: Osteoma right frontal sinus. Mild mucosal thickening in the ethmoid sinuses. Fluid level in the right sphenoid sinus. No acute orbital abnormality. Other: None IMPRESSION: 1. Focal hypodensity in the left anterior, superior cerebellum. This is suspicious for a focal area of edema/ischemia. MRI recommended for further evaluation. No hemorrhage. 2. Atrophy and chronic white matter small vessel ischemic changes 3. Fluid level in the sphenoid sinus with mucosal disease in the ethmoid sinuses. Electronically Signed   By: Donavan Foil M.D.   On: 07/28/2016 21:10   Ct Angio Neck W Or Wo Contrast  Result Date: 07/29/2016 CLINICAL DATA:  83 y/o  F; embolic stroke. EXAM: CT ANGIOGRAPHY HEAD AND NECK TECHNIQUE: Multidetector CT imaging of the head and neck was performed using the  standard protocol during bolus administration of intravenous contrast. Multiplanar CT image reconstructions and MIPs were obtained to evaluate the vascular anatomy. Carotid stenosis measurements (when applicable) are obtained utilizing NASCET criteria, using the distal internal carotid diameter as the denominator. CONTRAST:  50 cc Isovue 370 COMPARISON:  07/28/2016 MRI of the brain and CT of the head. FINDINGS: CT HEAD FINDINGS Brain: Focus of hypoattenuation within the left superior cerebellar hemisphere is stable in compatible with infarction. No evidence for new large territory infarct, significant mass effect, or acute intracranial hemorrhage. Stable chronic microvascular ischemic changes of white matter and parenchymal volume loss of the brain. No hydrocephalus or extra-axial collection. Vascular: As below. Skull: Normal. Negative for fracture or focal lesion. Sinuses: 7 mm right frontal sinus osteoma. Small fluid level within the sphenoid sinuses. Orbits: Bilateral intra-ocular lens replacement. Review of the MIP images confirms the above findings CTA NECK FINDINGS Aortic arch: Standard branching. Imaged portion shows no evidence of aneurysm or dissection. No significant stenosis of the major arch vessel origins. Moderate calcific atherosclerosis of the aortic arch. Right carotid system: No evidence of dissection, stenosis (50% or greater) or occlusion. Mild calcific atherosclerosis of the carotid bifurcation without stenosis. Tortuosity and kinking of the mid cervical internal carotid artery. Left carotid system: No evidence of dissection, stenosis (50% or greater) or occlusion.Mild calcific atherosclerosis of the carotid bifurcation without stenosis. Tortuosity and kinking of the mid cervical internal carotid artery. Vertebral arteries: Left vertebral artery 2 mm superiorly directed outpouching at the C1 level (series 13, image 135) compatible with a small aneurysm. Skeleton: Mild cervical spondylosis with  disc and facet degenerative changes greatest at the C4 through C7 levels. No high-grade bony canal stenosis. Other neck: Negative. Upper chest: Few scattered peripheral punctate nodules and mucous plugging of small airway is compatible with minimal bronchiolitis. Review of the MIP images confirms the above findings CTA HEAD FINDINGS Anterior circulation: No significant stenosis, proximal occlusion, aneurysm, or vascular malformation. Mild non stenotic calcific atherosclerosis of cavernous and paraclinoid internal carotid arteries. Posterior circulation: No significant stenosis, proximal occlusion, aneurysm, or vascular malformation. Venous sinuses: As permitted by contrast timing, patent. Anatomic variants: No bilateral fetal PCA with large posterior communicating artery is, diminutive right P1 segment, and no appreciable left P1 segment. Pole small anterior communicating artery. Delayed phase: No abnormal intracranial enhancement. Review of the MIP images confirms the above findings IMPRESSION: 1. Patent carotid and vertebral arteries. No large vessel occlusion of circle of Willis. 2. 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 3. Mild calcific atherosclerosis of carotid bifurcations and of carotid siphons without stenosis. 4. Left superior cerebellar hemisphere area of hypoattenuation compatible with infarction that is stable in comparison with prior studies. 5. No evidence for new large acute infarct, significant mass effect, or acute intracranial hemorrhage. Electronically Signed   By: Kristine Garbe M.D.   On: 07/29/2016 17:36   Mr Jeri Cos And Wo Contrast  Result Date: 07/28/2016 CLINICAL DATA:  Vertigo, follow-up abnormal CT. Woke up today with dizziness and LEFT-sided weakness. History of diabetes, hypertension, memory loss. EXAM: MRI HEAD WITHOUT AND WITH CONTRAST TECHNIQUE: Multiplanar, multiecho pulse sequences of the brain and surrounding structures were  obtained without and with intravenous contrast. CONTRAST:  61m MULTIHANCE GADOBENATE DIMEGLUMINE 529 MG/ML IV SOLN COMPARISON:  CT HEAD July 28, 2016 at 2007 hours FINDINGS: Postcontrast sequences are mildly motion degraded. INTRACRANIAL CONTENTS: 21 x 18 mm area reduced diffusion LEFT superior cerebellum with low ADC values within superior cerebellar artery territory. No susceptibility artifact to suggest acute hemorrhage ; a few punctate chronic micro hemorrhages associated with hypertension. The ventricles and sulci are normal for patient's age. No suspicious parenchymal signal, masses, mass effect. Patchy to confluent supratentorial and pontine white matter FLAIR T2 hyperintensities. 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 No abnormal intraparenchymal or extra-axial enhancement. No abnormal extra-axial fluid collections. No extra-axial masses. VASCULAR: Normal major intracranial vascular flow voids present at skull base. SKULL AND UPPER CERVICAL SPINE: No abnormal sellar expansion. No suspicious calvarial bone marrow signal. Craniocervical junction maintained. Grade 1 C3-4 anterolisthesis. SINUSES/ORBITS: Small RIGHT sphenoid sinus air-fluid level. Mastoid air cells are well aerated. The included ocular globes and orbital contents are non-suspicious. Status post bilateral ocular lens implants. OTHER: None. IMPRESSION: Acute LEFT cerebellar/SCA territory nonhemorrhagic infarct corresponding to CT abnormality. Moderate chronic small vessel ischemic disease and old bilateral small cerebellar infarcts. Electronically Signed   By: CElon AlasM.D.   On: 07/28/2016 23:24     Assessment/Plan   ICD-9-CM ICD-10-CM   1. Trochanteric bursitis of left hip 726.5 M70.62 methylPREDNISolone acetate (DEPO-MEDROL) injection 40 mg  2. Left leg pain 729.5 M79.605 VAS [place]OWER EXTREMITY VENOUS (DVT)     methylPREDNISolone acetate (DEPO-MEDROL) injection 40 mg  3. Other polyneuropathy 357.89 G62.89 gabapentin  (NEURONTIN) 100 MG capsule  4. Late effect of cerebrovascular accident (CVA) 438.9 I69.30   5. Type 2 diabetes mellitus with stage 1 chronic kidney disease, without long-term current use of insulin (HCC) 250.40 E11.22    585.1 N18.1     Depo-medrol 430minjection today  Start gabapentin 10041mt bedtime  Will call with [place]  T/c Ortho eval for hip bursitis if it does not improve  Continue home PT as tolerated  Follow up as scheduled or sooner if symptoms persist  Handout "neuropathic pain" and "hip bursitis" given  Maor Meckel S. CarPerlie GoldieGeorgia Bone And Joint Surgeonsd Adult [facility] [id number] [id number] (Monday-Friday 8 AM - 5 PM) ([phone] 5 PM and follow prompts

## 2016-08-16 NOTE — Patient Instructions (Addendum)
Depo-medrol 40mg  injection today  Start gabapentin 100mg  at bedtime  Will call with [place] results  Continue home PT as tolerated  Follow up as scheduled or sooner if symptoms persist   Neuropathic Pain Neuropathic pain is pain caused by damage to the nerves that are responsible for certain sensations in your body (sensory nerves). The pain can be caused by damage to:  The sensory nerves that send signals to your spinal cord and brain (peripheral nervous system).  The sensory nerves in your brain or spinal cord (central nervous system). Neuropathic pain can make you more sensitive to pain. What would be a minor sensation for most people may feel very painful if you have neuropathic pain. This is usually a long-term condition that can be difficult to treat. The type of pain can differ from person to person. It may start suddenly (acute), or it may develop slowly and last for a long time (chronic). Neuropathic pain may come and go as damaged nerves heal or may stay at the same level for years. It often causes emotional distress, loss of sleep, and a lower quality of life. What are the causes? The most common cause of damage to a sensory nerve is diabetes. Many other diseases and conditions can also cause neuropathic pain. Causes of neuropathic pain can be classified as:  Toxic. Many drugs and chemicals can cause toxic damage. The most common cause of toxic neuropathic pain is damage from drug treatment for cancer (chemotherapy).  Metabolic. This type of pain can happen when a disease causes imbalances that damage nerves. Diabetes is the most common of these diseases. Vitamin B deficiency caused by long-term alcohol abuse is another common cause.  Traumatic. Any injury that cuts, crushes, or stretches a nerve can cause damage and pain. A common example is feeling pain after losing an arm or leg (phantom limb pain).  Compression-related. If a sensory nerve gets trapped or compressed for a long  period of time, the blood supply to the nerve can be cut off.  Vascular. Many blood vessel diseases can cause neuropathic pain by decreasing blood supply and oxygen to nerves.  Autoimmune. This type of pain results from diseases in which the body's defense system mistakenly attacks sensory nerves. Examples of autoimmune diseases that can cause neuropathic pain include lupus and multiple sclerosis.  Infectious. Many types of viral infections can damage sensory nerves and cause pain. Shingles infection is a common cause of this type of pain.  Inherited. Neuropathic pain can be a symptom of many diseases that are passed down through families (genetic). What are the signs or symptoms? The main symptom is pain. Neuropathic pain is often described as:  Burning.  Shock-like.  Stinging.  Hot or cold.  Itching. How is this diagnosed? No single test can diagnose neuropathic pain. Your health care provider will do a physical exam and ask you about your pain. You may use a pain scale to describe how bad your pain is. You may also have tests to see if you have a high sensitivity to pain and to help find the cause and location of any sensory nerve damage. These tests may include:  Imaging studies, such as:  X-rays.  CT scan.  MRI.  Nerve conduction studies to test how well nerve signals travel through your sensory nerves (electrodiagnostic testing).  Stimulating your sensory nerves through electrodes on your skin and measuring the response in your spinal cord and brain (somatosensory evoked potentials). How is this treated? Treatment for neuropathic  pain may change over time. You may need to try different treatment options or a combination of treatments. Some options include:  Over-the-counter pain relievers.  Prescription medicines. Some medicines used to treat other conditions may also help neuropathic pain. These include medicines to:  Control seizures (anticonvulsants).  Relieve  depression (antidepressants).  Prescription-strength pain relievers (narcotics). These are usually used when other pain relievers do not help.  Transcutaneous nerve stimulation (TENS). This uses electrical currents to block painful nerve signals. The treatment is painless.  Topical and local anesthetics. These are medicines that numb the nerves. They can be injected as a nerve block or applied to the skin.  Alternative treatments, such as:  Acupuncture.  Meditation.  Massage.  Physical therapy.  Pain management programs.  Counseling. Follow these instructions at home:  Learn as much as you can about your condition.  Take medicines only as directed by your health care provider.  Work closely with all your health care providers to find what works best for you.  Have a good support system at home.  Consider joining a chronic pain support group. Contact a health care provider if:  Your pain treatments are not helping.  You are having side effects from your medicines.  You are struggling with fatigue, mood changes, depression, or anxiety. This information is not intended to replace advice given to you by your health care provider. Make sure you discuss any questions you have with your health care provider. Document Released: 12/14/2003 Document Revised: 10/06/2015 Document Reviewed: 08/26/2013 Elsevier Interactive Patient Education  2017 Elsevier Inc.  Hip Bursitis Hip bursitis is swelling of a fluid-filled sac (bursa) in your hip. This swelling (inflammation) can be painful. This condition may come and go over time. Follow these instructions at home: Medicines   Take over-the-counter and prescription medicines only as told by your doctor.  Do not drive or use heavy machinery while taking prescription pain medicine, or as told by your doctor.  If you were prescribed an antibiotic medicine, take it as told by your doctor. Do not stop taking the antibiotic even if you  start to feel better. Activity   Return to your normal activities as told by your doctor. Ask your doctor what activities are safe for you.  Rest and protect your hip until you feel better. General instructions   Wear wraps that put pressure on your hip (compression wraps) only as told by your doctor.  Raise (elevate) your hip above the level of your heart as much as you can. To do this, try putting a pillow under your hips while you lie down. Stop if this causes pain.  Do not use your hip to support your body weight until your doctor says that you can.  Use crutches as told by your doctor.  Gently rub and stretch your injured area as often as is comfortable.  Keep all follow-up visits as told by your doctor. This is important. How is this prevented?  Exercise regularly, as told by your doctor.  Warm up and stretch before being active.  Cool down and stretch after being active.  Avoid activities that bother your hip or cause pain.  Avoid sitting down for long periods at a time. Contact a doctor if:  You have a fever.  You get new symptoms.  You have trouble walking.  You have trouble doing everyday activities.  You have pain that gets worse.  You have pain that does not get better with medicine.  You get red  skin on your hip area.  You get a feeling of warmth in your hip area. Get help right away if:  You cannot move your hip.  You have very bad pain. This information is not intended to replace advice given to you by your health care provider. Make sure you discuss any questions you have with your health care provider. Document Released: 04/20/2010 Document Revised: 08/24/2015 Document Reviewed: 10/18/2014 Elsevier Interactive Patient Education  2017 [employer].

## 2016-08-16 NOTE — Therapy (Signed)
[facility] [address] [place], [place], [address] Phone: [phone]   Fax:  [phone]  Patient Details  Name: Penny Miller MRN: [id number] Date of Birth: 1931-05-07 Referring Provider:  No ref. provider found  Encounter Date: 05-Sep-2016  PHYSICAL THERAPY DISCHARGE SUMMARY  Visits from Start of Care: 1  Current functional level related to goals / functional outcomes:     PT Short Term Goals - 08/09/16 1624      PT SHORT TERM GOAL #1   Title Pt will verbalize understanding of CVA risk factors and signs/symptoms to decr. risk of additional CVA. TARGET DATE FOR ALL STGS: 09/06/16   Status New     PT SHORT TERM GOAL #2   Title Pt will improve DGI score to >/=17/24 to decr. falls risk.    Status New     PT SHORT TERM GOAL #3   Title Pt will perform 10MWT without an AD with a gait speed of >/=2.58f/sec. to safely amb. in the community without an AD.   Status New     PT SHORT TERM GOAL #4   Title Pt will amb. 500' over even terrain without an AD to improve functional mobility.    Status New          PT Long Term Goals - 08/09/16 1626      PT LONG TERM GOAL #1   Title Pt will perform HEP IND in order to improve strength, balance, endurance, and flexibility. TARGET DATE FOR ALL LTGS: 10/04/16   Status New     PT LONG TERM GOAL #2   Title Pt will amb. 1000' over uneven terrain, IND, to improve functional mobility and perform yardwork safely.   Status New     PT LONG TERM GOAL #3   Title Perform 6MWT and write goal as indicated.    Status New     PT LONG TERM GOAL #4   Title Pt will improve DGI score to >/=20/24 to decr. falls risk.    Status New        Remaining deficits: Unknown, as pt cancelled remaining visits, as she's now receiving HHPT. Therefore, PT discharging pt at this time.    Education / Equipment: PT POC, duration, frequency and outcome measure results.   Plan: Patient agrees to  discharge.  Patient goals were not met. Patient is being discharged due to not returning since the last visit.  ?????       Tyshawn Keel L 5Jun 07, 2018 10:50 AM       G-Codes - [id number]    Functional Assessment Tool Used (Outpatient Only) DGI: 15/24; gait speed with RW: 2.835fsec.; TUG without AD: 11.9sec.    Functional Limitation Mobility: Walking and moving around   Mobility: Walking and Moving Around Current Status ([phone] least 40 percent but less than 60 percent impaired, limited or restricted   Mobility: Walking and Moving Around Goal Status ([phone] least 1 percent but less than 20 percent impaired, limited or restricted   Mobility: Walking and Moving Around Discharge Status ([phone] least 40 percent but less than 60 percent impaired, limited or restricted       [facility] Center[address] Vernon DriveuPhillipsburgNCAlaska[address]hone: [phone] Fax:  [phone] JeGeoffry ParadisePT,DPT 0506/07/20180:52 AM Phone: [phone]: [phone]

## 2016-08-19 ENCOUNTER — Ambulatory Visit: Payer: Self-pay | Admitting: Nurse Practitioner

## 2016-08-19 ENCOUNTER — Encounter: Payer: Self-pay | Admitting: Occupational Therapy

## 2016-08-19 ENCOUNTER — Ambulatory Visit: Payer: Medicare Other | Admitting: Physical Therapy

## 2016-08-19 NOTE — Therapy (Signed)
[facility] [address] [place], [place], [address] Phone: [phone]   Fax:  [phone]  Patient Details  Name: Penny Miller MRN: [id number] Date of Birth: 1932/02/18 Referring Provider:  Dr. Leonie Man Encounter Date: 08/19/2016  Pt only seen for O.T. Evaluation and then cancelled all remaining appts as pt reports now getting home health therapies. Will d/c O.T. episode of care at this time. See evaluation for details   Carey Bullocks, OTR/L 08/19/2016, 1:41 PM  [facility] [address] [place], [place], [address] Phone: [phone]   Fax:  [phone]

## 2016-08-20 ENCOUNTER — Ambulatory Visit (HOSPITAL_COMMUNITY)
Admission: RE | Admit: 2016-08-20 | Discharge: 2016-08-20 | Disposition: A | Discharge status: Home / Self Care | Payer: Medicare Other | Source: Ambulatory Visit | Attending: Vascular Surgery | Admitting: Vascular Surgery

## 2016-08-20 ENCOUNTER — Telehealth: Payer: Self-pay | Admitting: *Deleted

## 2016-08-20 DIAGNOSIS — M79605 Pain in left leg: Secondary | ICD-10-CM | POA: Insufficient documentation

## 2016-08-20 DIAGNOSIS — I726 Aneurysm of vertebral artery: Secondary | ICD-10-CM | POA: Diagnosis not present

## 2016-08-20 DIAGNOSIS — I129 Hypertensive chronic kidney disease with stage 1 through stage 4 chronic kidney disease, or unspecified chronic kidney disease: Secondary | ICD-10-CM | POA: Diagnosis not present

## 2016-08-20 DIAGNOSIS — F0390 Unspecified dementia without behavioral disturbance: Secondary | ICD-10-CM | POA: Diagnosis not present

## 2016-08-20 DIAGNOSIS — N182 Chronic kidney disease, stage 2 (mild): Secondary | ICD-10-CM | POA: Diagnosis not present

## 2016-08-20 DIAGNOSIS — I69354 Hemiplegia and hemiparesis following cerebral infarction affecting left non-dominant side: Secondary | ICD-10-CM | POA: Diagnosis not present

## 2016-08-20 DIAGNOSIS — E1122 Type 2 diabetes mellitus with diabetic chronic kidney disease: Secondary | ICD-10-CM | POA: Diagnosis not present

## 2016-08-20 NOTE — Telephone Encounter (Signed)
Call never returned

## 2016-08-20 NOTE — Telephone Encounter (Signed)
Penny Miller with Cardiovascular Imaging [phone] called with Venous Ultrasound Results: Negative for DVT. She informed patient and sent home.

## 2016-08-22 ENCOUNTER — Ambulatory Visit (INDEPENDENT_AMBULATORY_CARE_PROVIDER_SITE_OTHER): Payer: Self-pay | Admitting: *Deleted

## 2016-08-22 ENCOUNTER — Encounter: Payer: Medicare Other | Admitting: Occupational Therapy

## 2016-08-22 DIAGNOSIS — I639 Cerebral infarction, unspecified: Secondary | ICD-10-CM

## 2016-08-22 LAB — CUP PACEART INCLINIC DEVICE CHECK
Date Time Interrogation Session: 20180524143021
MDC IDC PG IMPLANT DT: 20180502

## 2016-08-22 NOTE — Progress Notes (Signed)
LINQ wound check in clinic. Steri-strips removed. Wound edges approximated, wound well healed. Battery status: good. R-waves 0.50mV. 2 pause episodes- both occurring in AF episode, nocturnal. 1 AF episode lasting 3 hrs and 26 minutes(0.7% burden)- on Plavix and ASA s/p cryptogenic stroke, scheduled for AF Clinic 08/27/16. Monthly summary reports and ROV with [name] PRN.

## 2016-08-23 ENCOUNTER — Encounter: Payer: Self-pay | Admitting: Internal Medicine

## 2016-08-23 ENCOUNTER — Telehealth: Payer: Self-pay | Admitting: *Deleted

## 2016-08-23 DIAGNOSIS — I129 Hypertensive chronic kidney disease with stage 1 through stage 4 chronic kidney disease, or unspecified chronic kidney disease: Secondary | ICD-10-CM | POA: Diagnosis not present

## 2016-08-23 DIAGNOSIS — N182 Chronic kidney disease, stage 2 (mild): Secondary | ICD-10-CM | POA: Diagnosis not present

## 2016-08-23 DIAGNOSIS — G6289 Other specified polyneuropathies: Secondary | ICD-10-CM

## 2016-08-23 DIAGNOSIS — I726 Aneurysm of vertebral artery: Secondary | ICD-10-CM | POA: Diagnosis not present

## 2016-08-23 DIAGNOSIS — E1122 Type 2 diabetes mellitus with diabetic chronic kidney disease: Secondary | ICD-10-CM | POA: Diagnosis not present

## 2016-08-23 DIAGNOSIS — I69354 Hemiplegia and hemiparesis following cerebral infarction affecting left non-dominant side: Secondary | ICD-10-CM | POA: Diagnosis not present

## 2016-08-23 DIAGNOSIS — F0390 Unspecified dementia without behavioral disturbance: Secondary | ICD-10-CM | POA: Diagnosis not present

## 2016-08-23 MED ORDER — GABAPENTIN 100 MG PO CAPS: 200.0000 mg | ORAL_CAPSULE | Freq: Every day | ORAL | 3 refills | Status: DC

## 2016-08-23 NOTE — Telephone Encounter (Signed)
Glad medication is working. Increase gabapentin to 200mg  qhs. Please send new rx to pharmacy for gabapentin 100mg  #60 take 2 tabs po qhs with 3 RF

## 2016-08-23 NOTE — Telephone Encounter (Signed)
Patient daughter notified and agreed.  Medication list updated.  

## 2016-08-23 NOTE — Telephone Encounter (Signed)
Spoke with [name] as manual transmission has not been received.  [name] reports that there ended up being no signal in patient's bedroom where the monitor was setup.  She moved the monitor to the living room where she thinks it has better signal but the patient is now sleeping.  [name] will plan to send a manual transmission over the weekend.  Advised that I will set up a [employer] rep to be present at patient's 08/27/16 AF Clinic appointment to reprogram device transmission time if monitor will remain setup in living room.  [name] verbalizes understanding and denies additional questions or concerns at this time.

## 2016-08-23 NOTE — Telephone Encounter (Signed)
Spoke with patient's granddaughter, [name] (DPR), as manual transmission is required to update [facility] website with AF and pause episode ECGs (reviewed at 08/22/16 wound check OV).  Assisted [name] in sending a manual transmission.  Transmission slowly sending, so advised I would call her back if it is still not received in 15-20 minutes.  [name] verbalized understanding and appreciation.

## 2016-08-23 NOTE — Telephone Encounter (Signed)
Patient daughter, [name] called and stated that they saw you for an OV for Neuropathy pain and you prescribed Gabapentin. The daughter stated that this is working well but patient is still having some pain in the legs. Describes it as "lite"pain. Would like to know if the Gabapentin can be increased. Please Advise.

## 2016-08-25 ENCOUNTER — Other Ambulatory Visit: Payer: Self-pay | Admitting: Nurse Practitioner

## 2016-08-27 ENCOUNTER — Telehealth: Payer: Self-pay | Admitting: *Deleted

## 2016-08-27 ENCOUNTER — Ambulatory Visit: Payer: Medicare Other

## 2016-08-27 ENCOUNTER — Ambulatory Visit (HOSPITAL_COMMUNITY)
Admission: RE | Admit: 2016-08-27 | Discharge: 2016-08-27 | Disposition: A | Discharge status: Home / Self Care | Payer: Medicare Other | Source: Ambulatory Visit | Attending: Nurse Practitioner | Admitting: Nurse Practitioner

## 2016-08-27 ENCOUNTER — Encounter (HOSPITAL_COMMUNITY): Payer: Self-pay | Admitting: Nurse Practitioner

## 2016-08-27 VITALS — BP 132/74 | HR 42 | Ht 66.0 in | Wt 167.8 lb

## 2016-08-27 DIAGNOSIS — N182 Chronic kidney disease, stage 2 (mild): Secondary | ICD-10-CM | POA: Diagnosis not present

## 2016-08-27 DIAGNOSIS — Z881 Allergy status to other antibiotic agents status: Secondary | ICD-10-CM | POA: Insufficient documentation

## 2016-08-27 DIAGNOSIS — Z7984 Long term (current) use of oral hypoglycemic drugs: Secondary | ICD-10-CM | POA: Insufficient documentation

## 2016-08-27 DIAGNOSIS — F0390 Unspecified dementia without behavioral disturbance: Secondary | ICD-10-CM | POA: Diagnosis not present

## 2016-08-27 DIAGNOSIS — Z800 Family history of malignant neoplasm of digestive organs: Secondary | ICD-10-CM | POA: Insufficient documentation

## 2016-08-27 DIAGNOSIS — Z79899 Other long term (current) drug therapy: Secondary | ICD-10-CM | POA: Diagnosis not present

## 2016-08-27 DIAGNOSIS — I10 Essential (primary) hypertension: Secondary | ICD-10-CM | POA: Diagnosis not present

## 2016-08-27 DIAGNOSIS — I480 Paroxysmal atrial fibrillation: Secondary | ICD-10-CM | POA: Insufficient documentation

## 2016-08-27 DIAGNOSIS — E1142 Type 2 diabetes mellitus with diabetic polyneuropathy: Secondary | ICD-10-CM | POA: Diagnosis not present

## 2016-08-27 DIAGNOSIS — Z8051 Family history of malignant neoplasm of kidney: Secondary | ICD-10-CM | POA: Diagnosis not present

## 2016-08-27 DIAGNOSIS — I726 Aneurysm of vertebral artery: Secondary | ICD-10-CM | POA: Diagnosis not present

## 2016-08-27 DIAGNOSIS — Z7902 Long term (current) use of antithrombotics/antiplatelets: Secondary | ICD-10-CM | POA: Insufficient documentation

## 2016-08-27 DIAGNOSIS — Z9889 Other specified postprocedural states: Secondary | ICD-10-CM | POA: Diagnosis not present

## 2016-08-27 DIAGNOSIS — Z8249 Family history of ischemic heart disease and other diseases of the circulatory system: Secondary | ICD-10-CM | POA: Insufficient documentation

## 2016-08-27 DIAGNOSIS — Z888 Allergy status to other drugs, medicaments and biological substances status: Secondary | ICD-10-CM | POA: Insufficient documentation

## 2016-08-27 DIAGNOSIS — I69354 Hemiplegia and hemiparesis following cerebral infarction affecting left non-dominant side: Secondary | ICD-10-CM | POA: Diagnosis not present

## 2016-08-27 DIAGNOSIS — K589 Irritable bowel syndrome without diarrhea: Secondary | ICD-10-CM | POA: Diagnosis not present

## 2016-08-27 DIAGNOSIS — E785 Hyperlipidemia, unspecified: Secondary | ICD-10-CM | POA: Diagnosis not present

## 2016-08-27 DIAGNOSIS — K449 Diaphragmatic hernia without obstruction or gangrene: Secondary | ICD-10-CM | POA: Insufficient documentation

## 2016-08-27 DIAGNOSIS — Z8673 Personal history of transient ischemic attack (TIA), and cerebral infarction without residual deficits: Secondary | ICD-10-CM | POA: Diagnosis not present

## 2016-08-27 DIAGNOSIS — Z833 Family history of diabetes mellitus: Secondary | ICD-10-CM | POA: Insufficient documentation

## 2016-08-27 DIAGNOSIS — F17210 Nicotine dependence, cigarettes, uncomplicated: Secondary | ICD-10-CM | POA: Insufficient documentation

## 2016-08-27 DIAGNOSIS — E1122 Type 2 diabetes mellitus with diabetic chronic kidney disease: Secondary | ICD-10-CM | POA: Diagnosis not present

## 2016-08-27 DIAGNOSIS — I129 Hypertensive chronic kidney disease with stage 1 through stage 4 chronic kidney disease, or unspecified chronic kidney disease: Secondary | ICD-10-CM | POA: Diagnosis not present

## 2016-08-27 DIAGNOSIS — R001 Bradycardia, unspecified: Secondary | ICD-10-CM | POA: Diagnosis not present

## 2016-08-27 DIAGNOSIS — Z90710 Acquired absence of both cervix and uterus: Secondary | ICD-10-CM | POA: Diagnosis not present

## 2016-08-27 DIAGNOSIS — G6289 Other specified polyneuropathies: Secondary | ICD-10-CM

## 2016-08-27 MED ORDER — APIXABAN 5 MG PO TABS: 5.0000 mg | ORAL_TABLET | Freq: Two times a day (BID) | ORAL | 0 refills | Status: DC

## 2016-08-27 MED ORDER — GABAPENTIN 100 MG PO CAPS: 100.0000 mg | ORAL_CAPSULE | Freq: Every day | ORAL | 3 refills | Status: DC

## 2016-08-27 NOTE — Telephone Encounter (Signed)
Patient caregiver, [name] called and stated that patient increased her Gabapentin to 200mg  but the pain in her leg is extreme now despite the increase. They originally thought the pain was due to the Neuropathy but with the pain increasing after going up on the Gabapentin they are not sure. The left leg. No redness or warmth. Results of testing was Negative for DVT. Wonders if its coming from a pinch nerve in the back. Was given a shot of Steroid in office but didn't completely relieve the pain.    Heart rate is 42 and they are reducing Lipitor (at the [facility]) as I speak with Caregiver.

## 2016-08-27 NOTE — Telephone Encounter (Signed)
If pain due to pinched nerve, then gabapentin will help. However, if pain due to bulging disc in back, gabpentin likely will not help. Does she have Ortho specialist or neurologist? If not, refer to Ortho for further eval. Go to the ER if loss of bowel/bladder function.

## 2016-08-27 NOTE — Patient Instructions (Signed)
Your physician has recommended you make the following change in your medication:  1)Stop Plavix 2)Start Eliquis 5mg  twice a day

## 2016-08-27 NOTE — Telephone Encounter (Signed)
May reduce dose to 100mg  qhs until seen by neurology

## 2016-08-27 NOTE — Telephone Encounter (Signed)
[place] notified and stated that patient has an appointment with the Neurologist next week and will discuss this with him.  Also stated that [facility] and they were concerned with her taking the Lipitor and Gabapentin. Caregiver wants to know if they should decrease the Gabapentin back to 100mg  or to discontinue it since it is no longer helping with the pain. She doesn't want to wait till next week to discuss with Neurologist. Please Advise.

## 2016-08-27 NOTE — Progress Notes (Signed)
Primary Care Physician: Estill Dooms, MD Referring Physician: [facility] f/u EP: Dr. Deeann Cree Penny Miller is a 83 y.o. 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 It was recently picked up on remote that she had afib episode 5/21 and was referred to afib clinic for consideration of starting an anticoagulant. Chadsvasc score is 6. She is with her granddaughter today and history is primarily by granddaughter. She is also on gabapentin recently started for rt leg discomfort which is not helping pain. Starting of gabapentin may be contributing to bradycardia with HR of 41 in the office today. She has been on atenolol for years but HR is usually in the low 50's.  She denies a bleeding history. CBC checked 4/29 was normal. Stable on feet. Has not had any bleeding issues with Plavix, started at time of stroke. [employer] was in and reset the time that remote report is to be sent as requested by [address] office and confirmed afib from 5/21. Pt was not aware of afib episode.  Today, she denies symptoms of palpitations, chest pain, shortness of breath, orthopnea, PND, lower extremity edema, dizziness, presyncope, syncope, or neurologic sequela. The patient is tolerating medications without difficulties and is otherwise without complaint today.   Past Medical History:  Diagnosis Date  . Allergic rhinitis, cause unspecified   . Anxiety state, unspecified   . Diaphragmatic hernia without mention of obstruction or gangrene   . Diverticulosis of colon (without mention of hemorrhage)   . Female stress incontinence   . Insomnia, unspecified   . Interstitial cystitis    surgery 02/01/2013  . Irritable bowel syndrome   . Memory loss   . Other and unspecified hyperlipidemia   . Other premature beats   . Polyneuropathy in diabetes(357.2)   . Postmenopausal bleeding   . Proteinuria   . Type II or unspecified type diabetes mellitus with neurological  manifestations, not stated as uncontrolled(250.60)   . Unspecified essential hypertension   . Viral warts, unspecified    Past Surgical History:  Procedure Laterality Date  . ABDOMINAL HYSTERECTOMY  1977   Dr.Hambright  . APPENDECTOMY  1965  . CATARACT EXTRACTION, BILATERAL  02/10/2006   Dr.Groat   . COLONOSCOPY  07/17/2004   polypectomy  . [place]  . LOOP RECORDER INSERTION N/A 07/31/2016   Procedure: Loop Recorder Insertion;  Surgeon: [name], MD;  Location: [facility];  Service: Cardiovascular;  Laterality: N/A;  . TEE WITHOUT CARDIOVERSION N/A 07/30/2016   Procedure: TRANSESOPHAGEAL ECHOCARDIOGRAM (TEE);  Surgeon: [name], MD;  Location: [facility];  Service: Cardiovascular;  Laterality: N/A;  . Liborio Negron Torres  . TRIGGER FINGER RELEASE  05/13/16  . urinary bladder      surgery 02/01/2013 to stretch bladder stem    Current Outpatient Prescriptions  Medication Sig Dispense Refill  . acetaminophen (TYLENOL) 325 MG tablet Take 650 mg by mouth every 6 (six) hours as needed.    Marland Kitchen albuterol (PROVENTIL HFA;VENTOLIN HFA) 108 (90 Base) MCG/ACT inhaler Inhale 2 puffs into the lungs every 6 (six) hours as needed for wheezing or shortness of breath.    Marland Kitchen amLODipine (NORVASC) 2.5 MG tablet Take one tablet twice daily for blood pressure 180 tablet 3  . atenolol (TENORMIN) 50 MG tablet TAKE 1 TABLET DAILY TO HELPBLOOD PRESSURE 90 tablet 2  . atorvastatin (LIPITOR) 40 MG  tablet Take 1 tablet (40 mg total) by mouth daily at 6 PM. 30 tablet 2  . calcium carbonate (TUMS EX) 750 MG chewable tablet Chew 1 tablet by mouth daily as needed for heartburn.     . citalopram (CELEXA) 10 MG tablet TAKE 1/2 TABLET DAILY FOR  DEPRESSION 45 tablet 3  . gabapentin (NEURONTIN) 100 MG capsule Take 2 capsules (200 mg total) by mouth at bedtime. 60 capsule 3  . meclizine (ANTIVERT) 25 MG tablet Take 1 tablet (25 mg total) by mouth 3 (three) times  daily as needed for dizziness or nausea. 15 tablet 0  . metFORMIN (GLUCOPHAGE) 500 MG tablet TAKE 1 TABLET EVERY MORNINGFOR BLOOD SUGAR 90 tablet 2  . nitrofurantoin (MACRODANTIN) 100 MG capsule Take one capsule by mouth once daily for bladder 90 capsule 3  . Vitamin D, Ergocalciferol, (DRISDOL) 50000 units CAPS capsule TAKE 1 CAPSULE WEEKLY FOR  VITAMIN D 12 capsule 1  . apixaban (ELIQUIS) 5 MG TABS tablet Take 1 tablet (5 mg total) by mouth 2 (two) times daily. 60 tablet 0  . Dextromethorphan-Guaifenesin (MUCINEX DM MAXIMUM STRENGTH PO) Take 30 mLs by mouth daily as needed (congestion).      No current facility-administered medications for this encounter.     Allergies  Allergen Reactions  . Ceclor [Cefaclor] Other (See Comments)    Unknown reaction  . Codeine Nausea And Vomiting  . Lopid [Gemfibrozil] Other (See Comments)    Unknown reaction  . Metoprolol Nausea And Vomiting  . Morphine And Related Other (See Comments)    Passed out  . Niacin And Related Other (See Comments)    Unknown reaction  . Relafen [Nabumetone] Nausea And Vomiting  . Septra [Sulfamethoxazole-Trimethoprim] Nausea And Vomiting  . Tetracycline Nausea And Vomiting  . Toprol Xl [Metoprolol Tartrate] Nausea And Vomiting    Social History   Social History  . Marital status: Widowed    Spouse name: N/A  . Number of children: 1  . Years of education: N/A   Occupational History  . 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 Smoking status: Current Some Day Smoker    Packs/day: 0.25    Years: 62.00  . Smokeless tobacco: Never Used     Comment: stopped consistently smoking in 2012 but still smokes occasionally (one pack per week)  . Alcohol use No  . Drug use: No  . Sexual activity: No   Other Topics Concern  . Not on file   Social History Narrative   Ophthalmologist-Dr.Gould and Dr.Groat   Podiatrist- Dr.Tuchman   Dermatologist- Dr.Lupton   Urologist- Dr. Lawerance Bach    Lives with  her son.        Family History  Problem Relation Age of Onset  . Cancer Mother        pancreatic  . Cancer Father        bladder  . Hypertension Sister   . Diabetes Sister   . Diabetes Brother   . Kidney cancer Brother   . Hypertension Brother   . Diabetes Brother   . Heart disease Brother 13       Myocardial Infarction     ROS- All systems are reviewed and negative except as per the HPI above  Physical Exam: Vitals:   08/27/16 1044  BP: 132/74  Pulse: (!) 42  Weight: 167 lb 12.8 oz (76.1 kg)  Height: 5\' 6"  (1.676 m)   Wt Readings from Last  3 Encounters:  08/27/16 167 lb 12.8 oz (76.1 kg)  08/16/16 169 lb 6.4 oz (76.8 kg)  08/12/16 172 lb (78 kg)    Labs: Lab Results  Component Value Date   NA 133 (L) 07/28/2016   K 4.0 07/28/2016   CL 100 (L) 07/28/2016   CO2 24 07/28/2016   GLUCOSE 140 (H) 07/28/2016   BUN 15 07/28/2016   CREATININE 1.01 (H) 07/28/2016   CALCIUM 9.4 07/28/2016   Lab Results  Component Value Date   INR 1.06 07/08/2016   Lab Results  Component Value Date   CHOL 162 07/29/2016   HDL 38 (L) 07/29/2016   LDLCALC 81 07/29/2016   TRIG 217 (H) 07/29/2016     GEN- The patient is well appearing, alert and oriented x 3 today.   Head- normocephalic, atraumatic Eyes-  Sclera clear, conjunctiva pink Ears- hearing intact Oropharynx- clear Neck- supple, no JVP Lymph- no cervical lymphadenopathy Lungs- Clear to ausculation bilaterally, normal work of breathing Heart-slow regular rate and rhythm, no murmurs, rubs or gallops, PMI not laterally displaced GI- soft, NT, ND, + BS Extremities- no clubbing, cyanosis, or edema MS- no significant deformity or atrophy Skin- no rash or lesion Psych- euthymic mood, full affect Neuro- strength and sensation are intact  EKG-marked sinus brady at 42 bpm, with first degree av block, pr int 220 ms, qrs int 78 ms, qtc 379 ms Epic records reviewed    Assessment and Plan: 1. Paroxysmal afib,  asymptomatic in the setting of a cryptogenic stroke in April 2018 General education re afib management Chadsvasc score of 6 Bleeding history denied Discussed anticoagulants available to reduce stroke risk with afib Plavix will be stopped and she will start eliquis 5 mg bid  Bleeding precautions discussed I will message Dr. Leonie Man and make aware change in blood thinner  2. Asymptomatic bradycardia Gabapentin was recently started less than a month ago with increase in dose for left leg pain which is not helping discomfort and is probably contributing to bradycardia. Pt has been on atenolol for some time but usually has a heart rate in the low 50's. Her granddaughter anticipates that gabapentin will be stopped, she has called MD office this am.  F/u in afib clinic in one month with cbc  Butch Penny C. Frank Novelo, [facility] [address] [place], [place] [address] [phone]

## 2016-08-27 NOTE — Telephone Encounter (Signed)
[name], caregiver notified and agreed. Medication list updated.

## 2016-08-28 NOTE — Telephone Encounter (Signed)
LMOM (DPR) advising that monitor is updating itself as of today, but that we have still not received a manual transmission for review of past episodes.  [facility] phone number for any questions/concerns.

## 2016-08-29 DIAGNOSIS — E1122 Type 2 diabetes mellitus with diabetic chronic kidney disease: Secondary | ICD-10-CM | POA: Diagnosis not present

## 2016-08-29 DIAGNOSIS — I726 Aneurysm of vertebral artery: Secondary | ICD-10-CM | POA: Diagnosis not present

## 2016-08-29 DIAGNOSIS — N182 Chronic kidney disease, stage 2 (mild): Secondary | ICD-10-CM | POA: Diagnosis not present

## 2016-08-29 DIAGNOSIS — I69354 Hemiplegia and hemiparesis following cerebral infarction affecting left non-dominant side: Secondary | ICD-10-CM | POA: Diagnosis not present

## 2016-08-29 DIAGNOSIS — I129 Hypertensive chronic kidney disease with stage 1 through stage 4 chronic kidney disease, or unspecified chronic kidney disease: Secondary | ICD-10-CM | POA: Diagnosis not present

## 2016-08-29 DIAGNOSIS — F0390 Unspecified dementia without behavioral disturbance: Secondary | ICD-10-CM | POA: Diagnosis not present

## 2016-08-29 NOTE — Telephone Encounter (Signed)
Manual transmission received.  No new episodes (since wound check on 08/22/16).  Carelink successfully updated.

## 2016-08-30 ENCOUNTER — Ambulatory Visit: Payer: Medicare Other

## 2016-08-30 ENCOUNTER — Ambulatory Visit (INDEPENDENT_AMBULATORY_CARE_PROVIDER_SITE_OTHER): Payer: Medicare Other | Admitting: *Deleted

## 2016-08-30 DIAGNOSIS — I639 Cerebral infarction, unspecified: Secondary | ICD-10-CM | POA: Diagnosis not present

## 2016-09-02 ENCOUNTER — Ambulatory Visit: Payer: Medicare Other | Admitting: Physical Therapy

## 2016-09-02 ENCOUNTER — Encounter: Payer: Medicare Other | Admitting: Occupational Therapy

## 2016-09-03 ENCOUNTER — Ambulatory Visit (INDEPENDENT_AMBULATORY_CARE_PROVIDER_SITE_OTHER): Payer: Medicare Other | Admitting: Diagnostic Neuroimaging

## 2016-09-03 ENCOUNTER — Encounter: Payer: Self-pay | Admitting: Diagnostic Neuroimaging

## 2016-09-03 VITALS — BP 148/82 | HR 68 | Ht 66.0 in | Wt 168.1 lb

## 2016-09-03 DIAGNOSIS — N182 Chronic kidney disease, stage 2 (mild): Secondary | ICD-10-CM | POA: Diagnosis not present

## 2016-09-03 DIAGNOSIS — F0390 Unspecified dementia without behavioral disturbance: Secondary | ICD-10-CM | POA: Diagnosis not present

## 2016-09-03 DIAGNOSIS — E1142 Type 2 diabetes mellitus with diabetic polyneuropathy: Secondary | ICD-10-CM | POA: Diagnosis not present

## 2016-09-03 DIAGNOSIS — I63442 Cerebral infarction due to embolism of left cerebellar artery: Secondary | ICD-10-CM

## 2016-09-03 DIAGNOSIS — E1122 Type 2 diabetes mellitus with diabetic chronic kidney disease: Secondary | ICD-10-CM | POA: Diagnosis not present

## 2016-09-03 DIAGNOSIS — M5416 Radiculopathy, lumbar region: Secondary | ICD-10-CM

## 2016-09-03 DIAGNOSIS — I639 Cerebral infarction, unspecified: Secondary | ICD-10-CM

## 2016-09-03 DIAGNOSIS — I69354 Hemiplegia and hemiparesis following cerebral infarction affecting left non-dominant side: Secondary | ICD-10-CM | POA: Diagnosis not present

## 2016-09-03 DIAGNOSIS — M79605 Pain in left leg: Secondary | ICD-10-CM

## 2016-09-03 DIAGNOSIS — I726 Aneurysm of vertebral artery: Secondary | ICD-10-CM | POA: Diagnosis not present

## 2016-09-03 DIAGNOSIS — I129 Hypertensive chronic kidney disease with stage 1 through stage 4 chronic kidney disease, or unspecified chronic kidney disease: Secondary | ICD-10-CM | POA: Diagnosis not present

## 2016-09-03 DIAGNOSIS — I480 Paroxysmal atrial fibrillation: Secondary | ICD-10-CM | POA: Diagnosis not present

## 2016-09-03 MED ORDER — GABAPENTIN 300 MG PO CAPS: 300.0000 mg | ORAL_CAPSULE | Freq: Two times a day (BID) | ORAL | 6 refills | Status: DC

## 2016-09-03 NOTE — Patient Instructions (Signed)
Thank you for coming to see [place] at [facility]. I hope we have been able to provide you high quality care today.  You may receive a patient satisfaction survey over the next few weeks. We would appreciate your feedback and comments so that we may continue to improve ourselves and the health of our patients.   EMBOLIC STROKE (due to atrial fibrillation) (new problem, no additional workup) - continue apixaban - continue BP and heart rate control - continue statin - continue metformin  LEFT LEG PAIN (new problem, additional workup) --> post stroke pain + left lumbar radiculopathy - check MRI lumbar spine - increase gabapentin to 35m at bedtime x 1 week; then increase to twice a day - tylenol 5042mthree times per day as needed    ~~~~~~~~~~~~~~~~~~~~~~~~~~~~~~~~~~~~~~~~~~~~~~~~~~~~~~~~~~~~~~~~~  DR. Aurea Aronov'S GUIDE TO HAPPY AND HEALTHY LIVING These are some of my general health and wellness recommendations. Some of them may apply to you better than others. Please use common sense as you try these suggestions and feel free to ask me any questions.   ACTIVITY/FITNESS Mental, social, emotional and physical stimulation are very important for brain and body health. Try learning a new activity (arts, music, language, sports, games).  Keep moving your body to the best of your abilities. You can do this at home, inside or outside, the park, community center, gym or anywhere you like. Consider a physical therapist or personal trainer to get started. Consider the app Sworkit. Fitness trackers such as smart-watches, smart-phones or Fitbits can help as well.   NUTRITION Eat more plants: colorful vegetables, nuts, seeds and berries.  Eat less sugar, salt, preservatives and processed foods.  Avoid toxins such as cigarettes and alcohol.  Drink water when you are thirsty. Warm water with a slice of lemon is an excellent morning drink to start the day.  Consider these  websites for more information The Nutrition Source (hthttps://www.henry-hernandez.biz/[employer] Nutrition (wwWindowBlog.ch  RELAXATION Consider practicing mindfulness meditation or other relaxation techniques such as deep breathing, prayer, yoga, tai chi, massage. See website mindful.org or the apps Headspace or Calm to help get started.   SLEEP Try to get at least 7-8+ hours sleep per day. Regular exercise and reduced caffeine will help you sleep better. Practice good sleep hygeine techniques. See website sleep.org for more information.   PLANNING Prepare estate planning, living will, healthcare POA documents. Sometimes this is best planned with the help of an attorney. Theconversationproject.org and agingwithdignity.org are excellent resources.

## 2016-09-03 NOTE — Progress Notes (Signed)
Carelink Summary Report / Loop Recorder 

## 2016-09-03 NOTE — Progress Notes (Signed)
[facility]  PATIENT: Penny Miller DOB: [birth year]  REFERRING CLINICIAN: Curly Rim, MD HISTORY FROM: patient and grand-daughter REASON FOR VISIT: new consult    HISTORICAL  CHIEF COMPLAINT:  Chief Complaint  Patient presents with  . Establish Care    new patient stroke    HISTORY OF PRESENT ILLNESS:   83 year old female with hypertension, diabetes, hypercholesteremia, here for evaluation of hospital stroke discharge follow-up.  Since discharge, developed new onset of left leg pain from left foot to left hip and back, with numbness and electrical shooting pain, 1 week after discharge. Patient had been doing 1 week of outpatient physical therapy when symptoms started. Symptoms worse with activity and standing. Alleviated with change in position. Patient tried to reduce physical activity slightly but symptoms of pain in left leg continued. Patient has history of low back pain radiating to the right leg in the past. Patient was prescribed gabapentin 100 mg at bedtime without relief.  Also patient has now been diagnosed with atrial fibrillation based on cardiac heart monitor and has been on anticoagulation with Eliquis since 08/27/16.  DISCHARGE SUMMARY (07/31/16, Curly Rim): "83 year old Caucasian female with a past medical history of hypertension, chronic kidney disease stage II, hyperlipidemia on statin, type 2 diabetes, dementia, presented with left lower extremity weakness and dizziness. MRI showed left cerebellar stroke. Patient was hospitalized. She was outside the TPA window as determined by neurology."   REVIEW OF SYSTEMS: Full 14 system review of systems performed and negative with exception of: Weight loss fatigue shortness of breath diarrhea aching muscles easy bruising numbness weakness restless legs.   ALLERGIES: Allergies  Allergen Reactions  . Ceclor [Cefaclor] Other (See Comments)    Unknown reaction  . Codeine Nausea And Vomiting  . Lopid  [Gemfibrozil] Other (See Comments)    Unknown reaction  . Metoprolol Nausea And Vomiting  . Morphine And Related Other (See Comments)    Passed out  . Niacin And Related Other (See Comments)    Unknown reaction  . Relafen [Nabumetone] Nausea And Vomiting  . Septra [Sulfamethoxazole-Trimethoprim] Nausea And Vomiting  . Tetracycline Nausea And Vomiting  . Toprol Xl [Metoprolol Tartrate] Nausea And Vomiting    HOME MEDICATIONS: Outpatient Medications Prior to Visit  Medication Sig Dispense Refill  . acetaminophen (TYLENOL) 325 MG tablet Take 650 mg by mouth every 6 (six) hours as needed.    Marland Kitchen albuterol (PROVENTIL HFA;VENTOLIN HFA) 108 (90 Base) MCG/ACT inhaler Inhale 2 puffs into the lungs every 6 (six) hours as needed for wheezing or shortness of breath.    Marland Kitchen amLODipine (NORVASC) 2.5 MG tablet Take one tablet twice daily for blood pressure 180 tablet 3  . apixaban (ELIQUIS) 5 MG TABS tablet Take 1 tablet (5 mg total) by mouth 2 (two) times daily. 60 tablet 0  . atenolol (TENORMIN) 50 MG tablet TAKE 1 TABLET DAILY TO HELPBLOOD PRESSURE 90 tablet 2  . atorvastatin (LIPITOR) 40 MG tablet Take 1 tablet (40 mg total) by mouth daily at 6 PM. 30 tablet 2  . calcium carbonate (TUMS EX) 750 MG chewable tablet Chew 1 tablet by mouth daily as needed for heartburn.     . citalopram (CELEXA) 10 MG tablet TAKE 1/2 TABLET DAILY FOR  DEPRESSION 45 tablet 3  . Dextromethorphan-Guaifenesin (MUCINEX DM MAXIMUM STRENGTH PO) Take 30 mLs by mouth daily as needed (congestion).     . gabapentin (NEURONTIN) 100 MG capsule Take 1 capsule (100 mg total) by mouth at bedtime. 60 capsule  3  . meclizine (ANTIVERT) 25 MG tablet Take 1 tablet (25 mg total) by mouth 3 (three) times daily as needed for dizziness or nausea. 15 tablet 0  . metFORMIN (GLUCOPHAGE) 500 MG tablet TAKE 1 TABLET EVERY MORNINGFOR BLOOD SUGAR 90 tablet 2  . nitrofurantoin (MACRODANTIN) 100 MG capsule Take one capsule by mouth once daily for bladder 90  capsule 3  . Vitamin D, Ergocalciferol, (DRISDOL) 50000 units CAPS capsule TAKE 1 CAPSULE WEEKLY FOR  VITAMIN D 12 capsule 1   No facility-administered medications prior to visit.     PAST MEDICAL HISTORY: Past Medical History:  Diagnosis Date  . Allergic rhinitis, cause unspecified   . Anxiety state, unspecified   . Diaphragmatic hernia without mention of obstruction or gangrene   . Diverticulosis of colon (without mention of hemorrhage)   . Female stress incontinence   . Insomnia, unspecified   . Interstitial cystitis    surgery 02/01/2013  . Irritable bowel syndrome   . Memory loss   . Other and unspecified hyperlipidemia   . Other premature beats   . Polyneuropathy in diabetes(357.2)   . Postmenopausal bleeding   . Proteinuria   . 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 Unspecified essential hypertension   . Viral warts, unspecified     PAST SURGICAL HISTORY: Past Surgical History:  Procedure Laterality Date  . ABDOMINAL HYSTERECTOMY  1977   Dr.Hambright  . APPENDECTOMY  1965  . CATARACT EXTRACTION, BILATERAL  02/10/2006   Dr.Groat   . COLONOSCOPY  07/17/2004   polypectomy  . [place]  . LOOP RECORDER INSERTION N/A 07/31/2016   Procedure: Loop Recorder Insertion;  Surgeon: [name], MD;  Location: [facility];  Service: Cardiovascular;  Laterality: N/A;  . TEE WITHOUT CARDIOVERSION N/A 07/30/2016   Procedure: TRANSESOPHAGEAL ECHOCARDIOGRAM (TEE);  Surgeon: [name], MD;  Location: [facility];  Service: Cardiovascular;  Laterality: N/A;  . [place]  . TRIGGER FINGER RELEASE  05/13/16  . urinary bladder      surgery 02/01/2013 to stretch bladder stem    FAMILY HISTORY: Family History  Problem Relation Age of Onset  . Cancer Mother        pancreatic  . Cancer Father        bladder  . Heart attack Father   .  Hypertension Sister   . Diabetes Sister   . Diabetes Brother   . Kidney cancer Brother   . Hypertension Brother   . Diabetes Brother   . Heart disease Brother 56       Myocardial Infarction     SOCIAL HISTORY:  Social History   Social History  . Marital status: Widowed    Spouse name: N/A  . Number of children: 1  . Years of education: N/A   Occupational History  . 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 Smoking status: Former Smoker    Packs/day: 0.25    Years: 62.00    Quit date: 04/02/2011  . Smokeless tobacco: Never Used     Comment: stopped consistently smoking in 2012 but still smokes occasionally (one pack per week)  . Alcohol use No  . Drug use: No  . Sexual activity: No   Other Topics Concern  . Not on file   Social History Narrative   Ophthalmologist-Dr.Gould  and Dr.Groat   Podiatrist- Dr.Tuchman   Dermatologist- Dr.Lupton   Urologist- Dr. Lawerance Bach    Lives with her son.         PHYSICAL EXAM  GENERAL EXAM/CONSTITUTIONAL: Vitals:  Vitals:   09/03/16 1042  BP: (!) 148/82  Pulse: 68  Weight: 168 lb 2 oz (76.3 kg)  Height: 5\' 6"  (1.676 m)     Body mass index is 27.14 kg/m.  No exam data present  Patient is in no distress; well developed, nourished and groomed; neck is supple  CARDIOVASCULAR:  Examination of carotid arteries is normal; no carotid bruits  Regular rate and rhythm, no murmurs  Examination of peripheral vascular system by observation and palpation is normal  EYES:  Ophthalmoscopic exam of optic discs and posterior segments is normal; no papilledema or hemorrhages  MUSCULOSKELETAL:  Gait, strength, tone, movements noted in Neurologic exam below  NEUROLOGIC: MENTAL STATUS:  MMSE - Mini Mental State Exam 05/07/2016 12/21/2013  Orientation to time 5 5  Orientation to Place 4 4  Registration 3 3  Attention/ Calculation 5 5  Recall 3 2  Language- name 2 objects 2 2  Language- repeat 1 1    Language- follow 3 step command 3 3  Language- read & follow direction 1 1  Write a sentence 1 1  Copy design 1 1  Total score 29 28    awake, alert, oriented to person, place and time  recent and remote memory intact  normal attention and concentration  language fluent, comprehension intact, naming intact,   fund of knowledge appropriate  CRANIAL NERVE:   2nd - no papilledema on fundoscopic exam  2nd, 3rd, 4th, 6th - pupils equal and reactive to light, visual fields full to confrontation, extraocular muscles intact, no nystagmus  5th - facial sensation symmetric  7th - facial strength symmetric  8th - hearing intact  9th - palate elevates symmetrically, uvula midline  11th - shoulder shrug symmetric  12th - tongue protrusion midline  MOTOR:   normal bulk and tone  full strength in the BUE, RLE  LLE (3 PROX, 5 DISTAL)  SENSORY:   normal and symmetric to light touch  DECR PP IN BILATERAL FEET (LEFT WORSE THAN RIGHT)  DECR VIB IN LEFT FOOT  STRAIGHT LEG RAISE --> TRIGGERS PAIN ON LEFT WORSE THAN RIGHT LEG; TIGHT HAMSTRINGS; CAN ONLY RAISE UP TO 25-30 DEGREES OFF BED  COORDINATION:   finger-nose-finger, fine finger movements --> SLOW ON LEFT SIDE  REFLEXES:   deep tendon reflexes --> BUE 1  KNEES 2  ANKLES TRACE  GAIT/STATION:   narrow based gait; ANTALGIC, LIMPING GAIT    DIAGNOSTIC DATA (LABS, IMAGING, TESTING) - I reviewed patient records, labs, notes, testing and imaging myself where available.  Lab Results  Component Value Date   WBC 8.8 07/28/2016   HGB 13.0 07/28/2016   HCT 38.8 07/28/2016   MCV 93.7 07/28/2016   PLT 166 07/28/2016      Component Value Date/Time   NA 133 (L) 07/28/2016 1917   NA 139 06/23/2015 0927   K 4.0 07/28/2016 1917   CL 100 (L) 07/28/2016 1917   CO2 24 07/28/2016 1917   GLUCOSE 140 (H) 07/28/2016 1917   BUN 15 07/28/2016 1917   BUN 16 06/23/2015 0927   CREATININE 1.01 (H) 07/28/2016 1917    CREATININE 1.22 (H) 05/07/2016 0841   CALCIUM 9.4 07/28/2016 1917   PROT 6.2 05/07/2016 0841   PROT 6.1 06/23/2015 9150  ALBUMIN 3.9 05/07/2016 0841   ALBUMIN 3.9 06/23/2015 0927   AST 16 05/07/2016 0841   ALT 14 05/07/2016 0841   ALKPHOS 69 05/07/2016 0841   BILITOT 0.4 05/07/2016 0841   BILITOT 0.3 06/23/2015 0927   GFRNONAA 50 (L) 07/28/2016 1917   GFRNONAA 41 (L) 05/07/2016 0841   GFRAA 58 (L) 07/28/2016 1917   GFRAA 48 (L) 05/07/2016 0841   Lab Results  Component Value Date   CHOL 162 07/29/2016   HDL 38 (L) 07/29/2016   LDLCALC 81 07/29/2016   TRIG 217 (H) 07/29/2016   CHOLHDL 4.3 07/29/2016   Lab Results  Component Value Date   HGBA1C 6.8 (H) 07/29/2016   No results found for: VITAMINB12 Lab Results  Component Value Date   TSH 4.432 07/29/2016    12/13/15 carotid u/s - Heterogeneous plaque, bilaterally. - 1-39% bilateral ICA stenosis. - Normal subclavian arteries, bilaterally. - Patent vertebral arteries with antegrade flow.  07/28/16 CT head [I reviewed images myself and agree with interpretation. -VRP]  1. Focal hypodensity in the left anterior, superior cerebellum. This is suspicious for a focal area of edema/ischemia. MRI recommended for further evaluation. No hemorrhage. 2. Atrophy and chronic white matter small vessel ischemic changes  3. Fluid level in the sphenoid sinus with mucosal disease in the ethmoid sinuses.  07/28/16 MRI brain [I reviewed images myself and agree with interpretation. -VRP]  - Acute LEFT cerebellar/SCA territory nonhemorrhagic infarct corresponding to CT abnormality. - Moderate chronic small vessel ischemic disease and old bilateral small cerebellar infarcts.  07/29/16 CTA head / neck [I reviewed images myself and agree with interpretation. -VRP]  1. Patent carotid and vertebral arteries. No large vessel occlusion of circle of Willis. 2. 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 3. Mild calcific  atherosclerosis of carotid bifurcations and of carotid siphons without stenosis. 4. Left superior cerebellar hemisphere area of hypoattenuation compatible with infarction that is stable in comparison with prior studies. 5. No evidence for new large acute infarct, significant mass effect, or acute intracranial hemorrhage.  07/30/16 TEE - No cardiac source of emboli was indentified.  08/20/16 LLE u/s - no DVT or SVT      ASSESSMENT AND PLAN  83 y.o. year old female here with hypertension, diabetes, hyperkalemia, atrial fibrillation, here for hospital stroke follow-up of embolic stroke of left superior cerebellum. Patient doing well from stroke standpoint.   However has developed new left leg pain which may be post stroke pain versus left lumbar radiculopathy.   Dx:  1. Embolic stroke involving left cerebellar artery (HCC)   2. Paroxysmal atrial fibrillation ([place])   3. Lumbar radiculopathy   4. Left leg pain   5. Diabetic polyneuropathy associated with type 2 diabetes mellitus ([place])      PLAN:  EMBOLIC STROKE (due to atrial fibrillation) (new problem, no additional workup) - continue apixaban - continue BP and heart rate control - continue statin - continue metformin  LEFT LEG PAIN (new problem, additional workup) --> post stroke pain + left lumbar radiculopathy - check MRI lumbar spine (tried and failed 6 weeks conservative therapy; patient is candidate surgery or pain intervention) - increase gabapentin to 300mg  at bedtime x 1 week; then increase to twice a day - tylenol 500mg  three times per day as needed  Orders Placed This Encounter  Procedures  . MR LUMBAR SPINE WO CONTRAST   Meds ordered this encounter  Medications  . gabapentin (NEURONTIN) 300 MG capsule  Sig: Take 1 capsule (300 mg total) by mouth 2 (two) times daily.    Dispense:  60 capsule    Refill:  6   Return in about 3 months (around 12/04/2016).  I reviewed images, labs, notes, records myself. I  summarized findings and reviewed with patient, for this high risk condition (cerebellar stroke; left leg pain) requiring high complexity decision making.     Penni Bombard, MD 09/30/9595, 47:18 AM Certified in Neurology, Neurophysiology and Neuroimaging  [facility] [address] [place], [place] [address] [phone]

## 2016-09-04 ENCOUNTER — Encounter: Payer: Self-pay | Admitting: Podiatry

## 2016-09-04 ENCOUNTER — Ambulatory Visit (INDEPENDENT_AMBULATORY_CARE_PROVIDER_SITE_OTHER): Payer: Medicare Other | Admitting: Podiatry

## 2016-09-04 DIAGNOSIS — E1142 Type 2 diabetes mellitus with diabetic polyneuropathy: Secondary | ICD-10-CM

## 2016-09-04 DIAGNOSIS — B351 Tinea unguium: Secondary | ICD-10-CM

## 2016-09-04 DIAGNOSIS — M79676 Pain in unspecified toe(s): Secondary | ICD-10-CM | POA: Diagnosis not present

## 2016-09-04 LAB — CUP PACEART REMOTE DEVICE CHECK
MDC IDC PG IMPLANT DT: 20180502
MDC IDC SESS DTM: 20180601213608

## 2016-09-04 NOTE — Progress Notes (Signed)
Patient ID: Penny Miller, female   DOB: 1931/11/11, 83 y.o.   MRN: [id number]    Subjective: This patient presents again for scheduled visit complaining of uncomfortable toenails and walking wearing shoes and requests nail debridement  Objective: Orientated 3 No peripheral edema bilaterally Spider varicosities dorsal ankle feet bilaterally DP and PT pulses 2/4 bilaterally Reflex delay bilaterally Sensation to 10 g monofilament wire intact 4/5 bilaterally Vibratory sensation reactive Ankle reflex equal and reactive bilaterally No Open skin lesions bilaterally Atrophic skin bilaterally The toenails are elongated, incurvated, discolored, hypertrophic and tender to direct palpation 6-10 Manual motor testing: Dorsi flexion, plantar flexion, inversion, eversion 5/5 bilaterally  Assessment: Type II diabetic withhistory ofperipheral neuropathy Protective sensation intact bilaterally Onychomycoses 6-10 with symptoms   Plan: Debridement toenails 6-10 mechanically and electronically without any bleeding  Reappoint 3 months

## 2016-09-04 NOTE — Patient Instructions (Signed)

## 2016-09-05 ENCOUNTER — Ambulatory Visit: Payer: Medicare Other | Admitting: Physical Therapy

## 2016-09-05 ENCOUNTER — Encounter: Payer: Medicare Other | Admitting: Occupational Therapy

## 2016-09-06 DIAGNOSIS — N182 Chronic kidney disease, stage 2 (mild): Secondary | ICD-10-CM | POA: Diagnosis not present

## 2016-09-06 DIAGNOSIS — F0390 Unspecified dementia without behavioral disturbance: Secondary | ICD-10-CM | POA: Diagnosis not present

## 2016-09-06 DIAGNOSIS — E1122 Type 2 diabetes mellitus with diabetic chronic kidney disease: Secondary | ICD-10-CM | POA: Diagnosis not present

## 2016-09-06 DIAGNOSIS — I69354 Hemiplegia and hemiparesis following cerebral infarction affecting left non-dominant side: Secondary | ICD-10-CM | POA: Diagnosis not present

## 2016-09-06 DIAGNOSIS — I129 Hypertensive chronic kidney disease with stage 1 through stage 4 chronic kidney disease, or unspecified chronic kidney disease: Secondary | ICD-10-CM | POA: Diagnosis not present

## 2016-09-06 DIAGNOSIS — I726 Aneurysm of vertebral artery: Secondary | ICD-10-CM | POA: Diagnosis not present

## 2016-09-09 ENCOUNTER — Ambulatory Visit: Payer: Medicare Other | Admitting: Physical Therapy

## 2016-09-09 ENCOUNTER — Encounter: Payer: Medicare Other | Admitting: Occupational Therapy

## 2016-09-12 ENCOUNTER — Ambulatory Visit: Payer: Medicare Other | Admitting: Physical Therapy

## 2016-09-12 ENCOUNTER — Encounter: Payer: Medicare Other | Admitting: Occupational Therapy

## 2016-09-13 ENCOUNTER — Ambulatory Visit
Admission: RE | Admit: 2016-09-13 | Discharge: 2016-09-13 | Disposition: A | Discharge status: Home / Self Care | Payer: Medicare Other | Source: Ambulatory Visit | Attending: Diagnostic Neuroimaging | Admitting: Diagnostic Neuroimaging

## 2016-09-13 DIAGNOSIS — M79605 Pain in left leg: Secondary | ICD-10-CM

## 2016-09-13 DIAGNOSIS — I726 Aneurysm of vertebral artery: Secondary | ICD-10-CM | POA: Diagnosis not present

## 2016-09-13 DIAGNOSIS — M5416 Radiculopathy, lumbar region: Secondary | ICD-10-CM | POA: Diagnosis not present

## 2016-09-13 DIAGNOSIS — I63442 Cerebral infarction due to embolism of left cerebellar artery: Secondary | ICD-10-CM

## 2016-09-13 DIAGNOSIS — F0390 Unspecified dementia without behavioral disturbance: Secondary | ICD-10-CM | POA: Diagnosis not present

## 2016-09-13 DIAGNOSIS — E1122 Type 2 diabetes mellitus with diabetic chronic kidney disease: Secondary | ICD-10-CM | POA: Diagnosis not present

## 2016-09-13 DIAGNOSIS — I129 Hypertensive chronic kidney disease with stage 1 through stage 4 chronic kidney disease, or unspecified chronic kidney disease: Secondary | ICD-10-CM | POA: Diagnosis not present

## 2016-09-13 DIAGNOSIS — M48061 Spinal stenosis, lumbar region without neurogenic claudication: Secondary | ICD-10-CM | POA: Diagnosis not present

## 2016-09-13 DIAGNOSIS — N182 Chronic kidney disease, stage 2 (mild): Secondary | ICD-10-CM | POA: Diagnosis not present

## 2016-09-13 DIAGNOSIS — I69354 Hemiplegia and hemiparesis following cerebral infarction affecting left non-dominant side: Secondary | ICD-10-CM | POA: Diagnosis not present

## 2016-09-17 ENCOUNTER — Telehealth: Payer: Self-pay | Admitting: *Deleted

## 2016-09-17 DIAGNOSIS — F0390 Unspecified dementia without behavioral disturbance: Secondary | ICD-10-CM | POA: Diagnosis not present

## 2016-09-17 DIAGNOSIS — I129 Hypertensive chronic kidney disease with stage 1 through stage 4 chronic kidney disease, or unspecified chronic kidney disease: Secondary | ICD-10-CM | POA: Diagnosis not present

## 2016-09-17 DIAGNOSIS — I726 Aneurysm of vertebral artery: Secondary | ICD-10-CM | POA: Diagnosis not present

## 2016-09-17 DIAGNOSIS — N182 Chronic kidney disease, stage 2 (mild): Secondary | ICD-10-CM | POA: Diagnosis not present

## 2016-09-17 DIAGNOSIS — E1122 Type 2 diabetes mellitus with diabetic chronic kidney disease: Secondary | ICD-10-CM | POA: Diagnosis not present

## 2016-09-17 DIAGNOSIS — I69354 Hemiplegia and hemiparesis following cerebral infarction affecting left non-dominant side: Secondary | ICD-10-CM | POA: Diagnosis not present

## 2016-09-17 NOTE — Telephone Encounter (Signed)
Called patient to give her MRI lumbar spine results. Patient was unavailable, but her son, [name] on [place] requested to receive results. This RN advised he may want to write down the results. He stated he would. Per Dr Felecia Shelling, informed him that her MRI shows that she does have severe spinal stenosis or narrowing of the spinal canal at L3-L4 and moderately severe spinal stenosis at L4-L5.  Advised him this could lead to leg pain. If her pain is not any better, advised [name] the dr can set up a lumbar epidural steroid injection at [facility].  [name] inquired if this was related to her stroke; advised him it is not. He questioned what would cause it, stating she has had hardly any falls, but this began after she got home from the hospital following her stroke.  Advised him this RN cannot completely answer his question but would suggest it is mostly age-related.  [name] stated he would discuss results with her and call back. He verbalized understanding, appreciation of call.

## 2016-09-18 NOTE — Telephone Encounter (Signed)
Pt's granddaughter called to advise they are wanting to setup epidural. She said steroids cause her glucose levels to go up. She is wanting to discuss this 1st. She is aware Dr Mamie Nick will be back in the office on Monday and this can wait until then.

## 2016-09-19 DIAGNOSIS — N182 Chronic kidney disease, stage 2 (mild): Secondary | ICD-10-CM | POA: Diagnosis not present

## 2016-09-19 DIAGNOSIS — E1122 Type 2 diabetes mellitus with diabetic chronic kidney disease: Secondary | ICD-10-CM | POA: Diagnosis not present

## 2016-09-19 DIAGNOSIS — I69354 Hemiplegia and hemiparesis following cerebral infarction affecting left non-dominant side: Secondary | ICD-10-CM | POA: Diagnosis not present

## 2016-09-19 DIAGNOSIS — F0390 Unspecified dementia without behavioral disturbance: Secondary | ICD-10-CM | POA: Diagnosis not present

## 2016-09-19 DIAGNOSIS — I726 Aneurysm of vertebral artery: Secondary | ICD-10-CM | POA: Diagnosis not present

## 2016-09-19 DIAGNOSIS — I129 Hypertensive chronic kidney disease with stage 1 through stage 4 chronic kidney disease, or unspecified chronic kidney disease: Secondary | ICD-10-CM | POA: Diagnosis not present

## 2016-09-23 ENCOUNTER — Ambulatory Visit: Payer: Medicare Other | Admitting: Physical Therapy

## 2016-09-23 ENCOUNTER — Telehealth: Payer: Self-pay | Admitting: *Deleted

## 2016-09-23 ENCOUNTER — Encounter: Payer: Medicare Other | Admitting: Occupational Therapy

## 2016-09-23 ENCOUNTER — Other Ambulatory Visit (HOSPITAL_COMMUNITY): Payer: Self-pay | Admitting: *Deleted

## 2016-09-23 DIAGNOSIS — I10 Essential (primary) hypertension: Secondary | ICD-10-CM

## 2016-09-23 DIAGNOSIS — E559 Vitamin D deficiency, unspecified: Secondary | ICD-10-CM

## 2016-09-23 DIAGNOSIS — E1129 Type 2 diabetes mellitus with other diabetic kidney complication: Secondary | ICD-10-CM

## 2016-09-23 DIAGNOSIS — N182 Chronic kidney disease, stage 2 (mild): Secondary | ICD-10-CM

## 2016-09-23 DIAGNOSIS — E7849 Other hyperlipidemia: Secondary | ICD-10-CM

## 2016-09-23 MED ORDER — APIXABAN 5 MG PO TABS: 5.0000 mg | ORAL_TABLET | Freq: Two times a day (BID) | ORAL | 0 refills | Status: DC

## 2016-09-23 MED ORDER — APIXABAN 5 MG PO TABS: 5.0000 mg | ORAL_TABLET | Freq: Two times a day (BID) | ORAL | 2 refills | Status: DC

## 2016-09-23 NOTE — Telephone Encounter (Signed)
There is minimal risk with sugars going up. Recommend that sugars are well controlled before going for epidural injections. -VRP

## 2016-09-23 NOTE — Telephone Encounter (Signed)
[name], Caregiver called and stated that patient needs a refill on her Vitamin D 50,000units. Her vitamin D was last checked on 06/17/2014 and normal. Is this ok to refill. Wants sent to mail order [place]. Please Advise.

## 2016-09-23 NOTE — Telephone Encounter (Signed)
Patients granddaughter [name] called office in reference to scheduling epidural injection and would like to know the risks that are associated with it and with patient having diabetes make sure it will not be a harm to her.  Please call

## 2016-09-23 NOTE — Telephone Encounter (Signed)
LVM for Penny Miller, on DPR informing her thayt Dr Leta Baptist stated there is a minimal risk of patient's blood sugars going up with injection.  Advised that he recommended her blood sugars be well controlled before she gets injection. Requested a call back to let Dr Leta Baptist know if she wants to go forward, so he can place order. Left office number.

## 2016-09-24 DIAGNOSIS — F0390 Unspecified dementia without behavioral disturbance: Secondary | ICD-10-CM | POA: Diagnosis not present

## 2016-09-24 DIAGNOSIS — N182 Chronic kidney disease, stage 2 (mild): Secondary | ICD-10-CM | POA: Diagnosis not present

## 2016-09-24 DIAGNOSIS — I69354 Hemiplegia and hemiparesis following cerebral infarction affecting left non-dominant side: Secondary | ICD-10-CM | POA: Diagnosis not present

## 2016-09-24 DIAGNOSIS — I726 Aneurysm of vertebral artery: Secondary | ICD-10-CM | POA: Diagnosis not present

## 2016-09-24 DIAGNOSIS — I129 Hypertensive chronic kidney disease with stage 1 through stage 4 chronic kidney disease, or unspecified chronic kidney disease: Secondary | ICD-10-CM | POA: Diagnosis not present

## 2016-09-24 DIAGNOSIS — E1122 Type 2 diabetes mellitus with diabetic chronic kidney disease: Secondary | ICD-10-CM | POA: Diagnosis not present

## 2016-09-24 MED ORDER — VITAMIN D (ERGOCALCIFEROL) 1.25 MG (50000 UNIT) PO CAPS: 50000.0000 [IU] | ORAL_CAPSULE | ORAL | 0 refills | Status: DC

## 2016-09-24 NOTE — Telephone Encounter (Signed)
Rx faxed to pharmacy. Lab orders placed.

## 2016-09-24 NOTE — Telephone Encounter (Signed)
Let's refill for just a 90 day supply but no refills.  Let's add 25OHVitamin D to her labs in august to determine if she needs to continue to take the high dose vitamin D or if we can change to a lower dose daily form.

## 2016-09-26 ENCOUNTER — Other Ambulatory Visit: Payer: Self-pay | Admitting: Nurse Practitioner

## 2016-09-27 ENCOUNTER — Ambulatory Visit (HOSPITAL_COMMUNITY)
Admission: RE | Admit: 2016-09-27 | Discharge: 2016-09-27 | Disposition: A | Discharge status: Home / Self Care | Payer: Medicare Other | Source: Ambulatory Visit | Attending: Nurse Practitioner | Admitting: Nurse Practitioner

## 2016-09-27 ENCOUNTER — Encounter (HOSPITAL_COMMUNITY): Payer: Self-pay | Admitting: Nurse Practitioner

## 2016-09-27 ENCOUNTER — Other Ambulatory Visit: Payer: Self-pay | Admitting: *Deleted

## 2016-09-27 VITALS — BP 126/72 | HR 58 | Ht 66.0 in | Wt 171.0 lb

## 2016-09-27 DIAGNOSIS — E1142 Type 2 diabetes mellitus with diabetic polyneuropathy: Secondary | ICD-10-CM | POA: Diagnosis not present

## 2016-09-27 DIAGNOSIS — E785 Hyperlipidemia, unspecified: Secondary | ICD-10-CM | POA: Insufficient documentation

## 2016-09-27 DIAGNOSIS — Z8673 Personal history of transient ischemic attack (TIA), and cerebral infarction without residual deficits: Secondary | ICD-10-CM | POA: Diagnosis not present

## 2016-09-27 DIAGNOSIS — E1149 Type 2 diabetes mellitus with other diabetic neurological complication: Secondary | ICD-10-CM | POA: Insufficient documentation

## 2016-09-27 DIAGNOSIS — Z7901 Long term (current) use of anticoagulants: Secondary | ICD-10-CM | POA: Insufficient documentation

## 2016-09-27 DIAGNOSIS — I480 Paroxysmal atrial fibrillation: Secondary | ICD-10-CM | POA: Diagnosis not present

## 2016-09-27 DIAGNOSIS — Z87891 Personal history of nicotine dependence: Secondary | ICD-10-CM | POA: Insufficient documentation

## 2016-09-27 DIAGNOSIS — I4891 Unspecified atrial fibrillation: Secondary | ICD-10-CM | POA: Insufficient documentation

## 2016-09-27 DIAGNOSIS — I10 Essential (primary) hypertension: Secondary | ICD-10-CM | POA: Diagnosis not present

## 2016-09-27 DIAGNOSIS — R001 Bradycardia, unspecified: Secondary | ICD-10-CM | POA: Insufficient documentation

## 2016-09-27 DIAGNOSIS — F419 Anxiety disorder, unspecified: Secondary | ICD-10-CM | POA: Insufficient documentation

## 2016-09-27 DIAGNOSIS — Z7984 Long term (current) use of oral hypoglycemic drugs: Secondary | ICD-10-CM | POA: Diagnosis not present

## 2016-09-27 DIAGNOSIS — K589 Irritable bowel syndrome without diarrhea: Secondary | ICD-10-CM | POA: Insufficient documentation

## 2016-09-27 LAB — CBC
HCT: 38.7 % (ref 36.0–46.0)
Hemoglobin: 12.9 g/dL (ref 12.0–15.0)
MCH: 32.2 pg (ref 26.0–34.0)
MCHC: 33.3 g/dL (ref 30.0–36.0)
MCV: 96.5 fL (ref 78.0–100.0)
PLATELETS: 172 10*3/uL (ref 150–400)
RBC: 4.01 MIL/uL (ref 3.87–5.11)
RDW: 13.1 % (ref 11.5–15.5)
WBC: 10.4 10*3/uL (ref 4.0–10.5)

## 2016-09-27 MED ORDER — NITROFURANTOIN MACROCRYSTAL 100 MG PO CAPS: ORAL_CAPSULE | ORAL | 3 refills | Status: DC

## 2016-09-27 NOTE — Progress Notes (Signed)
Primary Care Physician: [name], DO Referring Physician: [facility] f/u EP: Dr. Deeann Cree Penny Miller is a 83 y.o. 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 It was recently picked up on remote that she had afib episode 5/21 and was referred to afib clinic for consideration of starting an anticoagulant. Chadsvasc score is 6. She is with her granddaughter today and history is primarily by granddaughter. She is also on gabapentin recently started for rt leg discomfort which is not helping pain. Starting of gabapentin may be contributing to bradycardia with HR of 41 in the office today. She has been on atenolol for years but HR is usually in the low 50's.  She denies a bleeding history. CBC checked 4/29 was normal. Stable on feet. Has not had any bleeding issues with Plavix, started at time of stroke. [employer] was in and reset the time that remote report is to be sent as requested by [address] office and confirmed afib from 5/21. Pt was not aware of afib episode.  F/u in the afib clinic 6/30 for f/u of starting DAOC for afib seen on linq with the setting of a previous stroke. She is doing well with drug. No bleeding issues. Pt is not aware of irregular heart beat.  Today, she denies symptoms of palpitations, chest pain, shortness of breath, orthopnea, PND, lower extremity edema, dizziness, presyncope, syncope, or neurologic sequela. The patient is tolerating medications without difficulties and is otherwise without complaint today.   Past Medical History:  Diagnosis Date  . Allergic rhinitis, cause unspecified   . Anxiety state, unspecified   . Diaphragmatic hernia without mention of obstruction or gangrene   . Diverticulosis of colon (without mention of hemorrhage)   . Female stress incontinence   . Insomnia, unspecified   . Interstitial cystitis    surgery 02/01/2013  . Irritable bowel syndrome   . Memory loss   . Other and  unspecified hyperlipidemia   . Other premature beats   . Polyneuropathy in diabetes(357.2)   . Postmenopausal bleeding   . Proteinuria   . 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 Unspecified essential hypertension   . Viral warts, unspecified    Past Surgical History:  Procedure Laterality Date  . ABDOMINAL HYSTERECTOMY  1977   Dr.Hambright  . APPENDECTOMY  1965  . CATARACT EXTRACTION, BILATERAL  02/10/2006   Dr.Groat   . COLONOSCOPY  07/17/2004   polypectomy  . [place]  . LOOP RECORDER INSERTION N/A 07/31/2016   Procedure: Loop Recorder Insertion;  Surgeon: [name], MD;  Location: [place] [facility];  Service: Cardiovascular;  Laterality: N/A;  . TEE WITHOUT CARDIOVERSION N/A 07/30/2016   Procedure: TRANSESOPHAGEAL ECHOCARDIOGRAM (TEE);  Surgeon: [name], MD;  Location: [place];  Service: Cardiovascular;  Laterality: N/A;  . [place]  . TRIGGER FINGER RELEASE  05/13/16  . urinary bladder      surgery 02/01/2013 to stretch bladder stem    Current Outpatient Prescriptions  Medication Sig Dispense Refill  . acetaminophen (TYLENOL) 325 MG tablet Take 650 mg by mouth every 6 (six) hours as needed.    Marland Kitchen albuterol (PROVENTIL HFA;VENTOLIN HFA) 108 (90 Base) MCG/ACT inhaler Inhale 2 puffs into the lungs every 6 (six) hours as needed for wheezing or shortness of breath.    Marland Kitchen  amLODipine (NORVASC) 2.5 MG tablet Take one tablet twice daily for blood pressure 180 tablet 3  . apixaban (ELIQUIS) 5 MG TABS tablet Take 1 tablet (5 mg total) by mouth 2 (two) times daily. 30 tablet 0  . atenolol (TENORMIN) 50 MG tablet TAKE 1 TABLET DAILY TO HELPBLOOD PRESSURE 90 tablet 2  . atorvastatin (LIPITOR) 40 MG tablet Take 1 tablet (40 mg total) by mouth daily at 6 PM. 30 tablet 2  . calcium carbonate (TUMS EX) 750 MG chewable tablet Chew 1 tablet by mouth  daily as needed for heartburn.     . citalopram (CELEXA) 10 MG tablet TAKE 1/2 TABLET DAILY FOR  DEPRESSION 45 tablet 3  . gabapentin (NEURONTIN) 300 MG capsule Take 1 capsule (300 mg total) by mouth 2 (two) times daily. 60 capsule 6  . metFORMIN (GLUCOPHAGE) 500 MG tablet TAKE 1 TABLET EVERY MORNINGFOR BLOOD SUGAR 90 tablet 2  . Vitamin D, Ergocalciferol, (DRISDOL) 50000 units CAPS capsule Take 1 capsule (50,000 Units total) by mouth every 7 (seven) days. 12 capsule 0  . meclizine (ANTIVERT) 25 MG tablet Take 1 tablet (25 mg total) by mouth 3 (three) times daily as needed for dizziness or nausea. (Patient not taking: Reported on 09/27/2016) 15 tablet 0  . nitrofurantoin (MACRODANTIN) 100 MG capsule Take one capsule by mouth once daily for bladder (Patient not taking: Reported on 09/27/2016) 90 capsule 3   No current facility-administered medications for this encounter.     Allergies  Allergen Reactions  . Ceclor [Cefaclor] Other (See Comments)    Unknown reaction  . Codeine Nausea And Vomiting  . Lopid [Gemfibrozil] Other (See Comments)    Unknown reaction  . Metoprolol Nausea And Vomiting  . Morphine And Related Other (See Comments)    Passed out  . Niacin And Related Other (See Comments)    Unknown reaction  . Relafen [Nabumetone] Nausea And Vomiting  . Septra [Sulfamethoxazole-Trimethoprim] Nausea And Vomiting  . Tetracycline Nausea And Vomiting  . Toprol Xl [Metoprolol Tartrate] Nausea And Vomiting    Social History   Social History  . Marital status: Widowed    Spouse name: N/A  . Number of children: 1  . Years of education: N/A   Occupational History  . 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 Smoking status: Former Smoker    Packs/day: 0.25    Years: 62.00    Quit date: 04/02/2011  . Smokeless tobacco: Never Used     Comment: stopped consistently smoking in 2012 but still smokes occasionally (one pack per week)  . Alcohol use No  . Drug use: No    . Sexual activity: No   Other Topics Concern  . Not on file   Social History Narrative   Ophthalmologist-Dr.Gould and Dr.Groat   Podiatrist- Dr.Tuchman   Dermatologist- Dr.Lupton   Urologist- Dr. Lawerance Bach    Lives with her son.        Family History  Problem Relation Age of Onset  . Cancer Mother        pancreatic  . Cancer Father        bladder  . Heart attack Father   . Hypertension Sister   . Diabetes Sister   . Diabetes Brother   . Kidney cancer Brother   . Hypertension Brother   . Diabetes Brother   . Heart disease Brother 38       Myocardial Infarction  ROS- All systems are reviewed and negative except as per the HPI above  Physical Exam: Vitals:   09/27/16 1038  BP: 126/72  Pulse: (!) 58  Weight: 171 lb (77.6 kg)  Height: 5\' 6"  (1.676 m)   Wt Readings from Last 3 Encounters:  09/27/16 171 lb (77.6 kg)  09/03/16 168 lb 2 oz (76.3 kg)  08/27/16 167 lb 12.8 oz (76.1 kg)    Labs: Lab Results  Component Value Date   NA 133 (L) 07/28/2016   K 4.0 07/28/2016   CL 100 (L) 07/28/2016   CO2 24 07/28/2016   GLUCOSE 140 (H) 07/28/2016   BUN 15 07/28/2016   CREATININE 1.01 (H) 07/28/2016   CALCIUM 9.4 07/28/2016   Lab Results  Component Value Date   INR 1.06 07/08/2016   Lab Results  Component Value Date   CHOL 162 07/29/2016   HDL 38 (L) 07/29/2016   LDLCALC 81 07/29/2016   TRIG 217 (H) 07/29/2016     GEN- The patient is well appearing, alert and oriented x 3 today.   Head- normocephalic, atraumatic Eyes-  Sclera clear, conjunctiva pink Ears- hearing intact Oropharynx- clear Neck- supple, no JVP Lymph- no cervical lymphadenopathy Lungs- Clear to ausculation bilaterally, normal work of breathing Heart- regular rate and rhythm, no murmurs, rubs or gallops, PMI not laterally displaced GI- soft, NT, ND, + BS Extremities- no clubbing, cyanosis, or edema MS- no significant deformity or atrophy Skin- no rash or lesion Psych- euthymic  mood, full affect Neuro- strength and sensation are intact  EKG  sinus brady at 58 bpm, with first degree av block, pr int 220 ms, qrs int 78 ms, qtc 445 ms Epic records reviewed    Assessment and Plan: 1. Paroxysmal afib, asymptomatic in the setting of a cryptogenic stroke in April 2018 Chadsvasc score of 6 Continue eliquis 5 mg bid  Bleeding precautions discussed  2. Asymptomatic marked bradycardia Improved from last visit( 43 bpm) with  HR now at 58 bpm  F/u in afib clinic as needed continue remote checks PCP 8/9  Butch Penny C. Cayden Rautio, [facility] [address] [place], [place] [address] [phone]

## 2016-09-27 NOTE — Telephone Encounter (Signed)
[facility]

## 2016-09-30 ENCOUNTER — Ambulatory Visit (INDEPENDENT_AMBULATORY_CARE_PROVIDER_SITE_OTHER): Payer: Medicare Other | Admitting: *Deleted

## 2016-09-30 DIAGNOSIS — I639 Cerebral infarction, unspecified: Secondary | ICD-10-CM | POA: Diagnosis not present

## 2016-09-30 NOTE — Progress Notes (Signed)
Carelink Summary Report / Loop Recorder 

## 2016-10-01 DIAGNOSIS — N182 Chronic kidney disease, stage 2 (mild): Secondary | ICD-10-CM | POA: Diagnosis not present

## 2016-10-01 DIAGNOSIS — I726 Aneurysm of vertebral artery: Secondary | ICD-10-CM | POA: Diagnosis not present

## 2016-10-01 DIAGNOSIS — I129 Hypertensive chronic kidney disease with stage 1 through stage 4 chronic kidney disease, or unspecified chronic kidney disease: Secondary | ICD-10-CM | POA: Diagnosis not present

## 2016-10-01 DIAGNOSIS — I69354 Hemiplegia and hemiparesis following cerebral infarction affecting left non-dominant side: Secondary | ICD-10-CM | POA: Diagnosis not present

## 2016-10-01 DIAGNOSIS — E1122 Type 2 diabetes mellitus with diabetic chronic kidney disease: Secondary | ICD-10-CM | POA: Diagnosis not present

## 2016-10-01 DIAGNOSIS — F0390 Unspecified dementia without behavioral disturbance: Secondary | ICD-10-CM | POA: Diagnosis not present

## 2016-10-08 DIAGNOSIS — I129 Hypertensive chronic kidney disease with stage 1 through stage 4 chronic kidney disease, or unspecified chronic kidney disease: Secondary | ICD-10-CM | POA: Diagnosis not present

## 2016-10-08 DIAGNOSIS — E1122 Type 2 diabetes mellitus with diabetic chronic kidney disease: Secondary | ICD-10-CM | POA: Diagnosis not present

## 2016-10-08 DIAGNOSIS — F0390 Unspecified dementia without behavioral disturbance: Secondary | ICD-10-CM | POA: Diagnosis not present

## 2016-10-08 DIAGNOSIS — I69354 Hemiplegia and hemiparesis following cerebral infarction affecting left non-dominant side: Secondary | ICD-10-CM | POA: Diagnosis not present

## 2016-10-08 DIAGNOSIS — I726 Aneurysm of vertebral artery: Secondary | ICD-10-CM | POA: Diagnosis not present

## 2016-10-08 DIAGNOSIS — N182 Chronic kidney disease, stage 2 (mild): Secondary | ICD-10-CM | POA: Diagnosis not present

## 2016-10-10 LAB — CUP PACEART REMOTE DEVICE CHECK
Implantable Pulse Generator Implant Date: 20180502
MDC IDC SESS DTM: 20180701220908

## 2016-10-29 ENCOUNTER — Ambulatory Visit (INDEPENDENT_AMBULATORY_CARE_PROVIDER_SITE_OTHER): Payer: Medicare Other | Admitting: *Deleted

## 2016-10-29 DIAGNOSIS — I639 Cerebral infarction, unspecified: Secondary | ICD-10-CM | POA: Diagnosis not present

## 2016-10-30 NOTE — Progress Notes (Signed)
Carelink Summary Report / Loop Recorder 

## 2016-11-05 ENCOUNTER — Other Ambulatory Visit: Payer: Medicare Other

## 2016-11-05 DIAGNOSIS — E559 Vitamin D deficiency, unspecified: Secondary | ICD-10-CM

## 2016-11-05 DIAGNOSIS — I10 Essential (primary) hypertension: Secondary | ICD-10-CM | POA: Diagnosis not present

## 2016-11-05 DIAGNOSIS — N182 Chronic kidney disease, stage 2 (mild): Secondary | ICD-10-CM | POA: Diagnosis not present

## 2016-11-05 DIAGNOSIS — E784 Other hyperlipidemia: Secondary | ICD-10-CM | POA: Diagnosis not present

## 2016-11-05 DIAGNOSIS — E1129 Type 2 diabetes mellitus with other diabetic kidney complication: Secondary | ICD-10-CM

## 2016-11-05 DIAGNOSIS — E7849 Other hyperlipidemia: Secondary | ICD-10-CM

## 2016-11-05 LAB — LIPID PANEL
Cholesterol: 127 mg/dL (ref <200)
HDL: 43 mg/dL — ABNORMAL LOW (ref >50)
LDL Cholesterol: 55 mg/dL (ref <100)
Total CHOL/HDL Ratio: 3 Ratio (ref <5.0)
Triglycerides: 146 mg/dL (ref <150)
VLDL: 29 mg/dL (ref <30)

## 2016-11-05 LAB — BASIC METABOLIC PANEL WITH GFR
BUN: 14 mg/dL (ref 7–25)
CO2: 22 mmol/L (ref 20–32)
Calcium: 9.6 mg/dL (ref 8.6–10.4)
Chloride: 104 mmol/L (ref 98–110)
Creat: 1.18 mg/dL — ABNORMAL HIGH (ref 0.60–0.88)
GFR, Est African American: 49 mL/min — ABNORMAL LOW (ref >=60)
GFR, Est Non African American: 43 mL/min — ABNORMAL LOW (ref >=60)
Glucose, Bld: 129 mg/dL — ABNORMAL HIGH (ref 65–99)
Potassium: 4.7 mmol/L (ref 3.5–5.3)
Sodium: 138 mmol/L (ref 135–146)

## 2016-11-06 LAB — HEMOGLOBIN A1C
Hgb A1c MFr Bld: 6.7 % — ABNORMAL HIGH (ref <5.7)
Mean Plasma Glucose: 146 mg/dL

## 2016-11-06 LAB — MICROALBUMIN / CREATININE URINE RATIO
Creatinine, Urine: 61 mg/dL (ref 20–320)
Microalb Creat Ratio: 20 mcg/mg creat (ref <30)
Microalb, Ur: 1.2 mg/dL

## 2016-11-06 LAB — VITAMIN D 25 HYDROXY (VIT D DEFICIENCY, FRACTURES): Vit D, 25-Hydroxy: 53 ng/mL (ref 30–100)

## 2016-11-07 ENCOUNTER — Encounter: Payer: Self-pay | Admitting: Internal Medicine

## 2016-11-07 ENCOUNTER — Ambulatory Visit (INDEPENDENT_AMBULATORY_CARE_PROVIDER_SITE_OTHER): Payer: Medicare Other | Admitting: Internal Medicine

## 2016-11-07 VITALS — BP 132/78 | HR 68 | Temp 98.0°F | Wt 172.0 lb

## 2016-11-07 DIAGNOSIS — H8149 Vertigo of central origin, unspecified ear: Secondary | ICD-10-CM | POA: Diagnosis not present

## 2016-11-07 DIAGNOSIS — I639 Cerebral infarction, unspecified: Secondary | ICD-10-CM | POA: Diagnosis not present

## 2016-11-07 DIAGNOSIS — I10 Essential (primary) hypertension: Secondary | ICD-10-CM | POA: Diagnosis not present

## 2016-11-07 DIAGNOSIS — E669 Obesity, unspecified: Secondary | ICD-10-CM | POA: Insufficient documentation

## 2016-11-07 DIAGNOSIS — E784 Other hyperlipidemia: Secondary | ICD-10-CM | POA: Diagnosis not present

## 2016-11-07 DIAGNOSIS — N182 Chronic kidney disease, stage 2 (mild): Secondary | ICD-10-CM | POA: Diagnosis not present

## 2016-11-07 DIAGNOSIS — R197 Diarrhea, unspecified: Secondary | ICD-10-CM

## 2016-11-07 DIAGNOSIS — E663 Overweight: Secondary | ICD-10-CM | POA: Insufficient documentation

## 2016-11-07 DIAGNOSIS — E1129 Type 2 diabetes mellitus with other diabetic kidney complication: Secondary | ICD-10-CM | POA: Diagnosis not present

## 2016-11-07 DIAGNOSIS — I726 Aneurysm of vertebral artery: Secondary | ICD-10-CM | POA: Diagnosis not present

## 2016-11-07 DIAGNOSIS — H814 Vertigo of central origin: Secondary | ICD-10-CM

## 2016-11-07 DIAGNOSIS — I6930 Unspecified sequelae of cerebral infarction: Secondary | ICD-10-CM | POA: Diagnosis not present

## 2016-11-07 DIAGNOSIS — E7849 Other hyperlipidemia: Secondary | ICD-10-CM

## 2016-11-07 MED ORDER — ONETOUCH ULTRA 2 W/DEVICE KIT: PACK | 0 refills | Status: DC

## 2016-11-07 MED ORDER — ONETOUCH ULTRASOFT LANCETS MISC
1 refills | Status: DC
Start: 2016-11-07 — End: 2017-05-12

## 2016-11-07 MED ORDER — GLUCOSE BLOOD VI STRP: ORAL_STRIP | 1 refills | Status: DC

## 2016-11-07 MED ORDER — MECLIZINE HCL 25 MG PO TABS: 25.0000 mg | ORAL_TABLET | Freq: Every day | ORAL | 0 refills | Status: DC | PRN

## 2016-11-07 NOTE — Patient Instructions (Addendum)
Try a fiber supplement just once a day to try to bulk up your stools.    I recommend you wait to drive until 6 months from your stroke and only if you are not having the dizzy spells.  Use saline nasal spray for your sinus congestion.

## 2016-11-07 NOTE — Progress Notes (Signed)
Location:  [facility] clinic Provider:  Kaydie Petsch L. Mariea Clonts, D.O., C.M.D.  Code Status: Full code last ED visit Goals of Care:  Advanced Directives 11/07/2016  Does Patient Have a Medical Advance Directive? Yes  Type of Advance Directive -  Does patient want to make changes to medical advance directive? No - Patient declined  Copy of Early in Chart? -  Would patient like information on creating a medical advance directive? -     Chief Complaint  Patient presents with  . Medical Management of Chronic Issues    [facility] follow-up, transfer from [place]   HPI: Patient is a 83y.o. female seen today for medical management of chronic diseases.    Memory loss:  Seems to have some history out of sequence, but otherwise clear.  Has trouble remembering names.  Her granddaughter often comes along to appts.  Had recent stroke which affected this.  Keeps a notebook of information.  DMII with neurologic and renal complications:  On metformin 5052mwith breakfast.  Not on ace inhibitor or ARB.  Watches her diet.   Lab Results  Component Value Date   HGBA1C 6.7 (H) 11/05/2016   Essential htn:  BP satisfactory.    Hyperlipidemia:   Lab Results  Component Value Date   CHOL 127 11/05/2016   HDL 43 (L) 11/05/2016   LDLCALC 55 11/05/2016   TRIG 146 11/05/2016   CHOLHDL 3.0 11/05/2016  Lipids at goal  CKD2:  Due to diabetes.   Paroxysmal afib:  On eliquis blood thinner since stroke.  Also on atenolol.  They had monitored her during the hospitalization and found this.  Caused her embolic stroke.  Has interstitial cystitis:  No problems lately.  Had issues form age 83 Knows how to deal with it.  Sees Dr. DaRosana Hoes Has not had to go for a year.  H/o embolic stroke H/o vertebral artery aneurysm noted 07/29/16 She is on new medication since her stroke and having a stomach and bowel problem that's been persisting.  Had been having bms daily before this.  New meds are gabapentin and  eliquis.  At first she had diarrhea that made her weak, then when she'd urinate a little bowel movement would come out.  She's had to overuse toilet paper and wipes.  Going on 2-3 times per week.  If she reaches a little or bends over, stool comes out.  Eats a lot of veggies.  Avoids pork.    Her granddaughter was looking after her after her stroke.  Was not dizzy right after stroke, but has been lately.  She's also suddenly sleepy to where he has to go lie down.  She has to be careful turning her head and feeling dizzy.  She is on gabapentin for sciatica down her left leg. It did go away with the gabapentin 30058mid.    BMI is 27.70.  Does eat healthy diet.  Lost down to 160 when she was not feeling well and leg hurt after stroke.    She had home health PT for weakness of her left leg and it's good now.  She's moving it all around.  Initially needed walker.    Past Medical History:  Diagnosis Date  . Allergic rhinitis, cause unspecified   . Anxiety state, unspecified   . Diaphragmatic hernia without mention of obstruction or gangrene   . Diverticulosis of colon (without mention of hemorrhage)   . Female stress incontinence   . Insomnia,  unspecified   . Interstitial cystitis    surgery 02/01/2013  . Irritable bowel syndrome   . Memory loss   . Other and unspecified hyperlipidemia   . Other premature beats   . Polyneuropathy in diabetes(357.2)   . Postmenopausal bleeding   . Proteinuria   . 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 Unspecified essential hypertension   . Viral warts, unspecified     Past Surgical History:  Procedure Laterality Date  . ABDOMINAL HYSTERECTOMY  1977   Dr.Hambright  . APPENDECTOMY  1965  . CATARACT EXTRACTION, BILATERAL  02/10/2006   Dr.Groat   . COLONOSCOPY  07/17/2004   polypectomy  . [place]  . LOOP RECORDER INSERTION N/A 07/31/2016   Procedure:  Loop Recorder Insertion;  Surgeon: [name], MD;  Location: [place] [facility];  Service: Cardiovascular;  Laterality: N/A;  . TEE WITHOUT CARDIOVERSION N/A 07/30/2016   Procedure: TRANSESOPHAGEAL ECHOCARDIOGRAM (TEE);  Surgeon: [name], MD;  Location: [place];  Service: Cardiovascular;  Laterality: N/A;  . [place]  . TRIGGER FINGER RELEASE  05/13/16  . urinary bladder      surgery 02/01/2013 to stretch bladder stem    Allergies  Allergen Reactions  . Ceclor [Cefaclor] Other (See Comments)    Unknown reaction  . Codeine Nausea And Vomiting  . Lopid [Gemfibrozil] Other (See Comments)    Unknown reaction  . Metoprolol Nausea And Vomiting  . Morphine And Related Other (See Comments)    Passed out  . Niacin And Related Other (See Comments)    Unknown reaction  . Relafen [Nabumetone] Nausea And Vomiting  . Septra [Sulfamethoxazole-Trimethoprim] Nausea And Vomiting  . Tetracycline Nausea And Vomiting  . Toprol Xl [Metoprolol Tartrate] Nausea And Vomiting    Allergies as of 11/07/2016      Reactions   Ceclor [cefaclor] Other (See Comments)   Unknown reaction   Codeine Nausea And Vomiting   Lopid [gemfibrozil] Other (See Comments)   Unknown reaction   Metoprolol Nausea And Vomiting   Morphine And Related Other (See Comments)   Passed out   Niacin And Related Other (See Comments)   Unknown reaction   Relafen [nabumetone] Nausea And Vomiting   Septra [sulfamethoxazole-trimethoprim] Nausea And Vomiting   Tetracycline Nausea And Vomiting   Toprol Xl [metoprolol Tartrate] Nausea And Vomiting      Medication List       Accurate as of 11/07/16 10:54 AM. Always use your most recent med list.          acetaminophen 325 MG tablet Commonly known as:  TYLENOL Take 650 mg by mouth every 6 (six) hours as needed.   albuterol 108 (90 Base) MCG/ACT inhaler Commonly known as:  PROVENTIL HFA;VENTOLIN HFA Inhale 2 puffs into the lungs every  6 (six) hours as needed for wheezing or shortness of breath.   amLODipine 2.5 MG tablet Commonly known as:  NORVASC Take one tablet twice daily for blood pressure   apixaban 5 MG Tabs tablet Commonly known as:  ELIQUIS Take 1 tablet (5 mg total) by mouth 2 (two) times daily.   atenolol 50 MG tablet Commonly known as:  TENORMIN TAKE 1 TABLET DAILY TO HELPBLOOD PRESSURE   atorvastatin 40 MG tablet Commonly known as:  LIPITOR Take 1 tablet (40 mg total) by mouth daily at 6 PM.   calcium carbonate 750 MG  chewable tablet Commonly known as:  TUMS EX Chew 1 tablet by mouth daily as needed for heartburn.   citalopram 10 MG tablet Commonly known as:  CELEXA TAKE 1/2 TABLET DAILY FOR  DEPRESSION   gabapentin 300 MG capsule Commonly known as:  NEURONTIN Take 1 capsule (300 mg total) by mouth 2 (two) times daily.   glucose blood test strip Commonly known as:  ONE TOUCH ULTRA TEST Use to test blood sugar once daily DX: E11.29, E11.42   meclizine 25 MG tablet Commonly known as:  ANTIVERT Take 1 tablet (25 mg total) by mouth 3 (three) times daily as needed for dizziness or nausea.   metFORMIN 500 MG tablet Commonly known as:  GLUCOPHAGE TAKE 1 TABLET EVERY MORNINGFOR BLOOD SUGAR   nitrofurantoin 100 MG capsule Commonly known as:  MACRODANTIN Take one capsule by mouth once daily for bladder   ONE TOUCH ULTRA 2 w/Device Kit Use to test blood sugar once daily DX: E11.29, E11.42   onetouch ultrasoft lancets Use to test blood sugar once daily DX: E11.29, E11.42   Vitamin D (Ergocalciferol) 50000 units Caps capsule Commonly known as:  DRISDOL Take 1 capsule (50,000 Units total) by mouth every 7 (seven) days.       Review of Systems:  Review of Systems  Constitutional: Negative for chills and fever.  HENT: Positive for hearing loss.   Eyes:       Glasses  Respiratory: Negative for shortness of breath.   Cardiovascular: Negative for chest pain, palpitations, orthopnea, leg  swelling and PND.       Afib  Gastrointestinal: Positive for diarrhea. Negative for abdominal pain, blood in stool, constipation and melena.  Genitourinary:       Chronic interstitial cystitis  Musculoskeletal: Positive for back pain. Negative for falls.       Sciatica  Skin: Negative for itching and rash.  Neurological: Positive for dizziness, tingling and sensory change.  Endo/Heme/Allergies: Bruises/bleeds easily.  Psychiatric/Behavioral: Positive for memory loss.    Health Maintenance  Topic Date Due  . FOOT EXAM  06/22/2015  . INFLUENZA VACCINE  10/30/2016  . OPHTHALMOLOGY EXAM  12/25/2016  . PNA vac Low Risk Adult (2 of 2 - PPSV23) 05/07/2017  . HEMOGLOBIN A1C  05/08/2017  . URINE MICROALBUMIN  11/05/2017  . TETANUS/TDAP  09/15/2018  . DEXA SCAN  Completed    Physical Exam: Vitals:   11/07/16 1037  BP: 132/78  Pulse: 68  Temp: 98 F (36.7 C)  TempSrc: Oral  SpO2: 96%  Weight: 172 lb (78 kg)   Body mass index is 27.76 kg/m. Physical Exam  Constitutional: She is oriented to person, place, and time. She appears well-developed and well-nourished. No distress.  HENT:  Head: Normocephalic and atraumatic.  Eyes: Pupils are equal, round, and reactive to light. EOM are normal.  glasses  Neck: Neck supple. No JVD present.  Cardiovascular: Intact distal pulses.   irreg irreg  Pulmonary/Chest: Effort normal and breath sounds normal. No respiratory distress.  Abdominal: Soft. Bowel sounds are normal. She exhibits no distension. There is no tenderness.  Musculoskeletal:  Weakness and numbness left leg/side, still limping with that leg and 4+/5 strength vs right leg  Lymphadenopathy:    She has no cervical adenopathy.  Neurological: She is alert and oriented to person, place, and time. A sensory deficit is present. No cranial nerve deficit. She exhibits abnormal muscle tone.  Skin: Skin is warm and dry. There is pallor.  Psychiatric: She has a normal  mood and affect.     Labs reviewed: Basic Metabolic Panel:  Recent Labs  07/08/16 1505 07/28/16 1917 07/29/16 0414 11/05/16 0906  NA 136 133*  --  138  K 4.4 4.0  --  4.7  CL 104 100*  --  104  CO2 24 24  --  22  GLUCOSE 123* 140*  --  129*  BUN 15 15  --  14  CREATININE 1.03* 1.01*  --  1.18*  CALCIUM 9.6 9.4  --  9.6  TSH  --   --  4.432  --    Liver Function Tests:  Recent Labs  05/07/16 0841  AST 16  ALT 14  ALKPHOS 69  BILITOT 0.4  PROT 6.2  ALBUMIN 3.9   No results for input(s): LIPASE, AMYLASE in the last 8760 hours. No results for input(s): AMMONIA in the last 8760 hours. CBC:  Recent Labs  07/08/16 1505 07/28/16 1917 09/27/16 1052  WBC 11.6* 8.8 10.4  NEUTROABS 9.0* 6.3  --   HGB 13.5 13.0 12.9  HCT 40.5 38.8 38.7  MCV 95.3 93.7 96.5  PLT 177 166 172   Lipid Panel:  Recent Labs  05/07/16 0841 07/29/16 0414 11/05/16 0906  CHOL 151 162 127  HDL 42* 38* 43*  LDLCALC 65 81 55  TRIG 218* 217* 146  CHOLHDL 3.6 4.3 3.0   Lab Results  Component Value Date   HGBA1C 6.7 (H) 11/05/2016   Hospital records reviewed as well as Dr.Green's notes  Assessment/Plan 1. Overweight (BMI 25.0-29.9) - has lost weight since stroke, cont to work on diet and exercise--has made some considerable dietary changes - COMPLETE METABOLIC PANEL WITH GFR; Future - Lipid panel; Future  2. Vertigo of central origin, unspecified laterality -due to cerebellar stroke, not sure this will ever get back to baseline--pt reports it's a lot better, but gait remains very unsteady - meclizine (ANTIVERT) 25 MG tablet; Take 1 tablet (25 mg total) by mouth daily as needed for dizziness or nausea.  Dispense: 30 tablet; Refill: 0  3. Diarrhea, unspecified type -recommended trying a fiber supplement to bulk stools, if not helpful, may need further workup of loose stools and leakage  4. 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 Benign hypertension -bp satisfactory with current regimen, no changes, monitor - COMPLETE METABOLIC PANEL WITH GFR; Future  6. Other hyperlipidemia -cont statin, f/u labs: - Lipid panel; Future  7. Chronic renal disease, stage II Avoid nephrotoxic agents like nsaids, dose adjust renally excreted meds, hydrate. - COMPLETE METABOLIC PANEL WITH GFR; Future  8. Aneurysm of vertebral artery (HCC) -noted on imaging with stroke, monitor  9. 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 CBC with Differential/Platelet    Standing Status:   Future    Standing Expiration Date:   07/08/2017  . COMPLETE METABOLIC PANEL WITH GFR    Standing Status:   Future    Standing Expiration Date:   07/08/2017  . Lipid panel    Standing Status:   Future    Standing Expiration Date:  07/08/2017  . Hemoglobin A1c    Standing Status:   Future    Standing Expiration Date:   07/08/2017    Next appt:  03/20/2017 med mgt, labs before  more than 40 mins was spent with patient.   Malaka Ruffner L. Spenser Cong, D.O. [facility] [address], [place] [address] Cell Phone (Mon-Fri 8am-5pm):  [phone] On Call:  [phone] & follow prompts after 5pm & weekends Office Phone:  [phone] Office Fax:  [phone]

## 2016-11-08 ENCOUNTER — Other Ambulatory Visit: Payer: Self-pay | Admitting: Internal Medicine

## 2016-11-11 LAB — CUP PACEART REMOTE DEVICE CHECK
Date Time Interrogation Session: 20180731223717
MDC IDC PG IMPLANT DT: 20180502

## 2016-11-15 ENCOUNTER — Other Ambulatory Visit: Payer: Self-pay | Admitting: *Deleted

## 2016-11-15 MED ORDER — AMLODIPINE BESYLATE 2.5 MG PO TABS: ORAL_TABLET | ORAL | 3 refills | Status: DC

## 2016-11-15 NOTE — Telephone Encounter (Signed)
[facility]

## 2016-11-28 ENCOUNTER — Ambulatory Visit (INDEPENDENT_AMBULATORY_CARE_PROVIDER_SITE_OTHER): Payer: Medicare Other | Admitting: *Deleted

## 2016-11-28 DIAGNOSIS — I639 Cerebral infarction, unspecified: Secondary | ICD-10-CM

## 2016-11-29 LAB — CUP PACEART REMOTE DEVICE CHECK
Date Time Interrogation Session: 20180830223623
MDC IDC PG IMPLANT DT: 20180502

## 2016-11-29 NOTE — Progress Notes (Signed)
Carelink summary report received. Battery status OK. Normal device function. No new symptom episodes, tachy episodes, brady, or pause episodes. No new AF episodes. Monthly summary reports and ROV/PRN 

## 2016-11-29 NOTE — Progress Notes (Signed)
Carelink Summary Report / Loop Recorder 

## 2016-12-03 ENCOUNTER — Encounter: Payer: Medicare Other | Admitting: Podiatry

## 2016-12-03 NOTE — Progress Notes (Signed)
This encounter was created in error - please disregard.

## 2016-12-23 ENCOUNTER — Other Ambulatory Visit: Payer: Self-pay | Admitting: Internal Medicine

## 2016-12-26 ENCOUNTER — Ambulatory Visit: Payer: Self-pay

## 2016-12-30 ENCOUNTER — Ambulatory Visit (INDEPENDENT_AMBULATORY_CARE_PROVIDER_SITE_OTHER): Payer: Medicare Other | Admitting: *Deleted

## 2016-12-30 DIAGNOSIS — I639 Cerebral infarction, unspecified: Secondary | ICD-10-CM | POA: Diagnosis not present

## 2016-12-30 NOTE — Progress Notes (Signed)
Carelink Summary Report / Loop Recorder 

## 2016-12-31 ENCOUNTER — Ambulatory Visit: Payer: Medicare Other | Admitting: Diagnostic Neuroimaging

## 2016-12-31 ENCOUNTER — Telehealth: Payer: Self-pay | Admitting: Diagnostic Neuroimaging

## 2016-12-31 ENCOUNTER — Ambulatory Visit (INDEPENDENT_AMBULATORY_CARE_PROVIDER_SITE_OTHER): Payer: Medicare Other

## 2016-12-31 DIAGNOSIS — Z23 Encounter for immunization: Secondary | ICD-10-CM

## 2016-12-31 LAB — CUP PACEART REMOTE DEVICE CHECK
MDC IDC PG IMPLANT DT: 20180502
MDC IDC SESS DTM: 20180929234011

## 2016-12-31 NOTE — Telephone Encounter (Signed)
Spoke with daughter, [name] on [place] and advised her that the patient has been on gabapentin for several months. Typically a side effect would show up sooner. [name] stated she has had this problems ever since stating gabapentin and eliquis following her stroke. This RN inquired if patient had reached out to her PCP; daughter stated she wasn't sure.  This RN  looked at PCP note 11/07/16 and asked daughter if her mother is taking fiber supplement to bulk her stool. She stated she takes Metamucil daily. This RN advised she contact PCP to address possibility of needing referral to GI.  Daughter verbalized understanding, agreement , appreciation.

## 2016-12-31 NOTE — Telephone Encounter (Signed)
Patients grand daughter Penny Miller (listed on DPR) called office in reference to gabapentin (NEURONTIN) 300 MG capsule states medication is helping with the pain, but giving patient diarrhea.  Pharmacy- [facility].  Please call

## 2017-01-06 ENCOUNTER — Telehealth: Payer: Self-pay | Admitting: *Deleted

## 2017-01-06 NOTE — Telephone Encounter (Signed)
Called to reschedule patient's FU; spoke with [name], on DPR. She stated she cannot bring patient late in the day due to her job. Requested 8:30 am appointment. Rescheduled patient for same day , at 8:30 am. [name] verbalized understanding, requested she be called with reminder.

## 2017-01-27 ENCOUNTER — Ambulatory Visit (INDEPENDENT_AMBULATORY_CARE_PROVIDER_SITE_OTHER): Payer: Medicare Other | Admitting: *Deleted

## 2017-01-27 DIAGNOSIS — I639 Cerebral infarction, unspecified: Secondary | ICD-10-CM | POA: Diagnosis not present

## 2017-01-28 NOTE — Progress Notes (Signed)
Carelink Summary Report / Loop Recorder 

## 2017-01-31 LAB — CUP PACEART REMOTE DEVICE CHECK
Implantable Pulse Generator Implant Date: 20180502
MDC IDC SESS DTM: 20181030000700

## 2017-02-26 ENCOUNTER — Ambulatory Visit (INDEPENDENT_AMBULATORY_CARE_PROVIDER_SITE_OTHER): Payer: Medicare Other | Admitting: *Deleted

## 2017-02-26 DIAGNOSIS — I639 Cerebral infarction, unspecified: Secondary | ICD-10-CM

## 2017-02-27 NOTE — Progress Notes (Signed)
Carelink Summary Report / Loop Recorder 

## 2017-03-04 ENCOUNTER — Other Ambulatory Visit: Payer: Self-pay | Admitting: *Deleted

## 2017-03-04 MED ORDER — ATORVASTATIN CALCIUM 40 MG PO TABS: 40.0000 mg | ORAL_TABLET | Freq: Every day | ORAL | 1 refills | Status: DC

## 2017-03-04 NOTE — Telephone Encounter (Signed)
[facility]

## 2017-03-12 ENCOUNTER — Telehealth: Payer: Self-pay | Admitting: *Deleted

## 2017-03-12 NOTE — Telephone Encounter (Signed)
Spoke with patient regarding brady episode noted on LINQ on 03/10/17 at 0254, duration 7sec.  Patient reports she may have been awake at the time as she has been having "bladder issues."  She does not recall experiencing any presyncopal/syncopal symptoms.  Patient agrees to call the [facility] or seek emergency medical attention in the future if she notes any of these symptoms.  She is appreciative of call and denies additional questions or concerns at this time.

## 2017-03-13 LAB — CUP PACEART REMOTE DEVICE CHECK
Implantable Pulse Generator Implant Date: 20180502
MDC IDC SESS DTM: 20181129003842

## 2017-03-17 ENCOUNTER — Other Ambulatory Visit: Payer: Medicare Other

## 2017-03-17 DIAGNOSIS — I10 Essential (primary) hypertension: Secondary | ICD-10-CM

## 2017-03-17 DIAGNOSIS — N182 Chronic kidney disease, stage 2 (mild): Secondary | ICD-10-CM | POA: Diagnosis not present

## 2017-03-17 DIAGNOSIS — E1129 Type 2 diabetes mellitus with other diabetic kidney complication: Secondary | ICD-10-CM | POA: Diagnosis not present

## 2017-03-17 DIAGNOSIS — E7849 Other hyperlipidemia: Secondary | ICD-10-CM | POA: Diagnosis not present

## 2017-03-17 DIAGNOSIS — E663 Overweight: Secondary | ICD-10-CM

## 2017-03-18 LAB — CBC WITH DIFFERENTIAL/PLATELET
Basophils Absolute: 49 cells/uL (ref 0–200)
Basophils Relative: 0.6 %
Eosinophils Absolute: 178 cells/uL (ref 15–500)
Eosinophils Relative: 2.2 %
HCT: 38.2 % (ref 35.0–45.0)
Hemoglobin: 12.9 g/dL (ref 11.7–15.5)
Lymphs Abs: 1531 cells/uL (ref 850–3900)
MCH: 31.6 pg (ref 27.0–33.0)
MCHC: 33.8 g/dL (ref 32.0–36.0)
MCV: 93.6 fL (ref 80.0–100.0)
MPV: 13 fL — ABNORMAL HIGH (ref 7.5–12.5)
Monocytes Relative: 10 %
Neutro Abs: 5532 cells/uL (ref 1500–7800)
Neutrophils Relative %: 68.3 %
Platelets: 168 10*3/uL (ref 140–400)
RBC: 4.08 10*6/uL (ref 3.80–5.10)
RDW: 12.5 % (ref 11.0–15.0)
Total Lymphocyte: 18.9 %
WBC mixed population: 810 cells/uL (ref 200–950)
WBC: 8.1 10*3/uL (ref 3.8–10.8)

## 2017-03-18 LAB — HEMOGLOBIN A1C
Hgb A1c MFr Bld: 6.9 % of total Hgb — ABNORMAL HIGH (ref <5.7)
Mean Plasma Glucose: 151 (calc)
eAG (mmol/L): 8.4 (calc)

## 2017-03-18 LAB — COMPLETE METABOLIC PANEL WITH GFR
AG Ratio: 1.5 (calc) (ref 1.0–2.5)
ALT: 10 U/L (ref 6–29)
AST: 14 U/L (ref 10–35)
Albumin: 3.8 g/dL (ref 3.6–5.1)
Alkaline phosphatase (APISO): 104 U/L (ref 33–130)
BUN/Creatinine Ratio: 16 (calc) (ref 6–22)
BUN: 21 mg/dL (ref 7–25)
CO2: 29 mmol/L (ref 20–32)
Calcium: 9.4 mg/dL (ref 8.6–10.4)
Chloride: 104 mmol/L (ref 98–110)
Creat: 1.32 mg/dL — ABNORMAL HIGH (ref 0.60–0.88)
GFR, Est African American: 43 mL/min/{1.73_m2} — ABNORMAL LOW (ref >=60)
GFR, Est Non African American: 37 mL/min/{1.73_m2} — ABNORMAL LOW (ref >=60)
Globulin: 2.5 g/dL (calc) (ref 1.9–3.7)
Glucose, Bld: 144 mg/dL — ABNORMAL HIGH (ref 65–99)
Potassium: 4.6 mmol/L (ref 3.5–5.3)
Sodium: 139 mmol/L (ref 135–146)
Total Bilirubin: 0.5 mg/dL (ref 0.2–1.2)
Total Protein: 6.3 g/dL (ref 6.1–8.1)

## 2017-03-18 LAB — LIPID PANEL
Cholesterol: 135 mg/dL (ref <200)
HDL: 40 mg/dL — ABNORMAL LOW (ref >50)
LDL Cholesterol (Calc): 69 mg/dL (calc)
Non-HDL Cholesterol (Calc): 95 mg/dL (calc) (ref <130)
Total CHOL/HDL Ratio: 3.4 (calc) (ref <5.0)
Triglycerides: 179 mg/dL — ABNORMAL HIGH (ref <150)

## 2017-03-19 ENCOUNTER — Other Ambulatory Visit: Payer: Self-pay | Admitting: Internal Medicine

## 2017-03-20 ENCOUNTER — Encounter: Payer: Self-pay | Admitting: Internal Medicine

## 2017-03-20 ENCOUNTER — Ambulatory Visit (INDEPENDENT_AMBULATORY_CARE_PROVIDER_SITE_OTHER): Payer: Medicare Other | Admitting: Internal Medicine

## 2017-03-20 VITALS — BP 140/80 | HR 53 | Temp 97.8°F | Wt 171.0 lb

## 2017-03-20 DIAGNOSIS — I10 Essential (primary) hypertension: Secondary | ICD-10-CM

## 2017-03-20 DIAGNOSIS — R001 Bradycardia, unspecified: Secondary | ICD-10-CM

## 2017-03-20 DIAGNOSIS — E1129 Type 2 diabetes mellitus with other diabetic kidney complication: Secondary | ICD-10-CM | POA: Diagnosis not present

## 2017-03-20 DIAGNOSIS — R197 Diarrhea, unspecified: Secondary | ICD-10-CM

## 2017-03-20 DIAGNOSIS — N182 Chronic kidney disease, stage 2 (mild): Secondary | ICD-10-CM | POA: Diagnosis not present

## 2017-03-20 DIAGNOSIS — E7849 Other hyperlipidemia: Secondary | ICD-10-CM

## 2017-03-20 DIAGNOSIS — I639 Cerebral infarction, unspecified: Secondary | ICD-10-CM

## 2017-03-20 MED ORDER — GABAPENTIN 300 MG PO CAPS: 300.0000 mg | ORAL_CAPSULE | Freq: Two times a day (BID) | ORAL | 0 refills | Status: DC

## 2017-03-20 NOTE — Progress Notes (Signed)
Location:  [facility] clinic Provider:  Blakeley Scheier L. Mariea Clonts, D.O., C.M.D.  Code Status: full code Goals of Care:  Advanced Directives 11/07/2016  Does Patient Have a Medical Advance Directive? Yes  Type of Advance Directive -  Does patient want to make changes to medical advance directive? No - Patient declined  Copy of [place] in Chart? -  Would patient like information on creating a medical advance directive? -  No advance directives on file   Chief Complaint  Patient presents with  . Medical Management of Chronic Issues    26mh follow-up    HPI: Patient is a 83y.o. female seen today for medical management of chronic diseases.    HR was really low and cardiology contacted her. She was asymptomatic.    Has good and bad days.    Bowels are bothering her right now.  She is taking metamucil twice a day which does help sometimes, but other times, she'll still get loose stools.  When she was getting ready this am.  Took meds and drank water, then gets diarrhea.  Then has to go again after washing up and has diarrhea.  Then it changes to where she can't have a bm.  She has not had a problem with metformin for years.  Seems like gabapentin caused it to her.  She has a medicinal smell and taste, but it has helped her a lot with the severe pain down her leg.  She does not have pain, just the sensation of having to go to the bathroom.  She notes that all of this is also since her stroke.  She does not have the bowel or bladder control since then.  She also has a lot of gas.  Has meclizine for her dizziness.  They make her sleepy so she does not take them.  Says  It's more of a lightheadedness than a room spinning. She has not been weak.  Seems maybe this could be due to bradycardia.  The episode she had was at 2:30am.    Past Medical History:  Diagnosis Date  . Allergic rhinitis, cause unspecified   . Anxiety state, unspecified   . Diaphragmatic hernia without mention of  obstruction or gangrene   . Diverticulosis of colon (without mention of hemorrhage)   . Female stress incontinence   . Insomnia, unspecified   . Interstitial cystitis    surgery 02/01/2013  . Irritable bowel syndrome   . Memory loss   . Other and unspecified hyperlipidemia   . Other premature beats   . Polyneuropathy in diabetes(357.2)   . Postmenopausal bleeding   . Proteinuria   . 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 Unspecified essential hypertension   . Viral warts, unspecified     Past Surgical History:  Procedure Laterality Date  . ABDOMINAL HYSTERECTOMY  1977   Dr.Hambright  . APPENDECTOMY  1965  . CATARACT EXTRACTION, BILATERAL  02/10/2006   Dr.Groat   . COLONOSCOPY  07/17/2004   polypectomy  . [place] . LOOP RECORDER INSERTION N/A 07/31/2016   Procedure: Loop Recorder Insertion;  Surgeon: GEvans Lance MD;  [facility];  Service: Cardiovascular;  Laterality: N/A;  . TEE WITHOUT CARDIOVERSION N/A 07/30/2016   Procedure: TRANSESOPHAGEAL ECHOCARDIOGRAM (TEE);  Surgeon: PJosue Hector MD;  [place] [facility]  Service: Cardiovascular;  Laterality: N/A;  . TRIGGER FINGER RELEASE  1990   Dr.Carter  . TRIGGER FINGER RELEASE  05/13/16  . urinary bladder      surgery 02/01/2013 to stretch bladder stem    Allergies  Allergen Reactions  . Ceclor [Cefaclor] Other (See Comments)    Unknown reaction  . Codeine Nausea And Vomiting  . Lopid [Gemfibrozil] Other (See Comments)    Unknown reaction  . Metoprolol Nausea And Vomiting  . Morphine And Related Other (See Comments)    Passed out  . Niacin And Related Other (See Comments)    Unknown reaction  . Relafen [Nabumetone] Nausea And Vomiting  . Septra [Sulfamethoxazole-Trimethoprim] Nausea And Vomiting  . Tetracycline Nausea And Vomiting  . Toprol Xl [Metoprolol Tartrate] Nausea And Vomiting     Outpatient Encounter Medications as of 03/20/2017  Medication Sig  . acetaminophen (TYLENOL) 325 MG tablet Take 650 mg by mouth every 6 (six) hours as needed.  Marland Kitchen albuterol (PROVENTIL HFA;VENTOLIN HFA) 108 (90 Base) MCG/ACT inhaler Inhale 2 puffs into the lungs every 6 (six) hours as needed for wheezing or shortness of breath.  Marland Kitchen amLODipine (NORVASC) 2.5 MG tablet Take one tablet twice daily for blood pressure  . apixaban (ELIQUIS) 5 MG TABS tablet Take 1 tablet (5 mg total) by mouth 2 (two) times daily.  Marland Kitchen atenolol (TENORMIN) 50 MG tablet TAKE 1 TABLET DAILY TO HELPBLOOD PRESSURE  . atorvastatin (LIPITOR) 40 MG tablet Take 1 tablet (40 mg total) by mouth daily at 6 PM.  . calcium carbonate (TUMS EX) 750 MG chewable tablet Chew 1 tablet by mouth daily as needed for heartburn.   . citalopram (CELEXA) 10 MG tablet TAKE 1/2 TABLET DAILY FOR  DEPRESSION  . gabapentin (NEURONTIN) 300 MG capsule Take 1 capsule (300 mg total) by mouth 2 (two) times daily.  Marland Kitchen glucose blood (ONE TOUCH ULTRA TEST) test strip Use to test blood sugar once daily DX: E11.29, E11.42  . Lancets (ONETOUCH ULTRASOFT) lancets Use to test blood sugar once daily DX: E11.29, E11.42  . meclizine (ANTIVERT) 25 MG tablet Take 1 tablet (25 mg total) by mouth daily as needed for dizziness or nausea.  . metFORMIN (GLUCOPHAGE) 500 MG tablet TAKE 1 TABLET EVERY MORNINGFOR BLOOD SUGAR  . nitrofurantoin (MACRODANTIN) 100 MG capsule Take one capsule by mouth once daily for bladder  . Vitamin D, Ergocalciferol, (DRISDOL) 50000 units CAPS capsule TAKE 1 CAPSULE EVERY 7 DAYS  . [DISCONTINUED] gabapentin (NEURONTIN) 300 MG capsule Take 1 capsule (300 mg total) by mouth 2 (two) times daily.  . [DISCONTINUED] Blood Glucose Monitoring Suppl (ONE TOUCH ULTRA 2) w/Device KIT Use to test blood sugar once daily DX: E11.29, E11.42   No facility-administered encounter medications on file as of 03/20/2017.     Review of Systems:  Review of Systems   Constitutional: Negative for chills and fever.  HENT: Positive for hearing loss.   Eyes: Negative for blurred vision.       Glasses  Respiratory: Negative for cough and shortness of breath.   Cardiovascular: Negative for chest pain and palpitations.  Gastrointestinal: Positive for constipation and diarrhea. Negative for abdominal pain, blood in stool and melena.  Genitourinary: Positive for dysuria.  Neurological: Positive for dizziness. Negative for loss of consciousness.  Endo/Heme/Allergies: Does not bruise/bleed easily.       Diabetes  Psychiatric/Behavioral: Positive for memory loss. Negative for depression.    Health Maintenance  Topic Date Due  . FOOT EXAM  06/22/2015  . OPHTHALMOLOGY EXAM  12/25/2016  . PNA vac Low  Risk Adult (2 of 2 - PPSV23) 05/07/2017  . HEMOGLOBIN A1C  09/15/2017  . URINE MICROALBUMIN  11/05/2017  . TETANUS/TDAP  09/15/2018  . INFLUENZA VACCINE  Completed  . DEXA SCAN  Completed    Physical Exam: Vitals:   03/20/17 1009  BP: 140/80  Pulse: (!) 53  Temp: 97.8 F (36.6 C)  TempSrc: Oral  SpO2: 95%  Weight: 171 lb (77.6 kg)   Body mass index is 27.6 kg/m. Physical Exam  Constitutional: She is oriented to person, place, and time. She appears well-developed and well-nourished. No distress.  Cardiovascular: Normal rate, regular rhythm, normal heart sounds and intact distal pulses.  Pulmonary/Chest: Effort normal and breath sounds normal. No respiratory distress.  Abdominal: Soft. Bowel sounds are normal. She exhibits no distension. There is no tenderness.  Musculoskeletal: Normal range of motion. She exhibits no tenderness.  Neurological: She is alert and oriented to person, place, and time.  Skin: Capillary refill takes less than 2 seconds.  Psychiatric: She has a normal mood and affect.    Labs reviewed: Basic Metabolic Panel: Recent Labs    07/28/16 1917 07/29/16 0414 11/05/16 0906 03/17/17 0920  NA 133*  --  138 139  K 4.0  --   4.7 4.6  CL 100*  --  104 104  CO2 24  --  22 29  GLUCOSE 140*  --  129* 144*  BUN 15  --  14 21  CREATININE 1.01*  --  1.18* 1.32*  CALCIUM 9.4  --  9.6 9.4  TSH  --  4.432  --   --    Liver Function Tests: Recent Labs    05/07/16 0841 03/17/17 0920  AST 16 14  ALT 14 10  ALKPHOS 69  --   BILITOT 0.4 0.5  PROT 6.2 6.3  ALBUMIN 3.9  --    No results for input(s): LIPASE, AMYLASE in the last 8760 hours. No results for input(s): AMMONIA in the last 8760 hours. CBC: Recent Labs    07/08/16 1505 07/28/16 1917 09/27/16 1052 03/17/17 0920  WBC 11.6* 8.8 10.4 8.1  NEUTROABS 9.0* 6.3  --  5,532  HGB 13.5 13.0 12.9 12.9  HCT 40.5 38.8 38.7 38.2  MCV 95.3 93.7 96.5 93.6  PLT 177 166 172 168   Lipid Panel: Recent Labs    05/07/16 0841 07/29/16 0414 11/05/16 0906 03/17/17 0920  CHOL 151 162 127 135  HDL 42* 38* 43* 40*  LDLCALC 65 81 55  --   TRIG 218* 217* 146 179*  CHOLHDL 3.6 4.3 3.0 3.4   Lab Results  Component Value Date   HGBA1C 6.9 (H) 03/17/2017    Assessment/Plan 1. Diarrhea, unspecified type -thinks it is from gabapentin--I'm not so sure -says she only has it lately and had been fine on metformin long term -will try probiotic and see how she does with that  2. 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 Benign hypertension -bp at goal with current therapy, cont same, f/u with cardiology about bradycardia and lightheadedness - Basic metabolic panel; Future  4. Other hyperlipidemia -cont lipitor due to prior stroke, tolerating  5. Chronic renal disease, stage II Avoid nephrotoxic agents like nsaids, dose adjust renally excreted meds, hydrate. - Basic metabolic panel; Future  6. Bradycardia -f/u  with cardiology, suspect  this has caused her lightheadedness esp since meclizine not helpful and it's not a spinning sensation  Labs/tests ordered:   Orders Placed This Encounter  Procedures  . CBC with Differential/Platelet    Standing Status:   Future    Standing Expiration Date:   11/18/2017  . Basic metabolic panel    Standing Status:   Future    Standing Expiration Date:   11/18/2017    Order Specific Question:   Has the patient fasted?    Answer:   Yes  . Hemoglobin A1c    Standing Status:   Future    Standing Expiration Date:   11/18/2017    Next appt:  07/21/2017 med mgt, labs before   Tudor Chandley L. Jaliah Foody, D.O. [facility] [address], [place] [address] Cell Phone (Mon-Fri 8am-5pm):  [phone] On Call:  [phone] & follow prompts after 5pm & weekends Office Phone:  [phone] Office Fax:  [phone]

## 2017-03-20 NOTE — Patient Instructions (Signed)
Try Florastor 250mg  by mouth twice a day probiotic to help with gas, loose stools.   Stop the metamucil since it is not consistently helping.

## 2017-03-28 ENCOUNTER — Encounter: Payer: Self-pay | Admitting: Diagnostic Neuroimaging

## 2017-03-28 ENCOUNTER — Ambulatory Visit (INDEPENDENT_AMBULATORY_CARE_PROVIDER_SITE_OTHER): Payer: Medicare Other | Admitting: *Deleted

## 2017-03-28 ENCOUNTER — Ambulatory Visit (INDEPENDENT_AMBULATORY_CARE_PROVIDER_SITE_OTHER): Payer: Medicare Other | Admitting: Diagnostic Neuroimaging

## 2017-03-28 VITALS — BP 147/74 | HR 56 | Wt 173.6 lb

## 2017-03-28 DIAGNOSIS — I480 Paroxysmal atrial fibrillation: Secondary | ICD-10-CM | POA: Diagnosis not present

## 2017-03-28 DIAGNOSIS — I639 Cerebral infarction, unspecified: Secondary | ICD-10-CM

## 2017-03-28 DIAGNOSIS — I63442 Cerebral infarction due to embolism of left cerebellar artery: Secondary | ICD-10-CM | POA: Diagnosis not present

## 2017-03-28 DIAGNOSIS — M5416 Radiculopathy, lumbar region: Secondary | ICD-10-CM

## 2017-03-28 NOTE — Progress Notes (Signed)
[facility]  PATIENT: Penny Miller DOB: 07-Nov-1931  REFERRING CLINICIAN: Curly Rim, MD HISTORY FROM: patient and grand-daughter REASON FOR VISIT: follow up   HISTORICAL  CHIEF COMPLAINT:  Chief Complaint  Patient presents with  . Stroke    rm 7, granddgtr- [name], "doing well except for episode of low HR per loop recorder"  . Follow-up    6 month    HISTORY OF PRESENT ILLNESS:   UPDATE (03/28/17, VRP): Since last visit, doing well overall. Tolerating meds (eliquis and gabapentin). No alleviating or aggravating factors. [name], but no major injuries. Feels well now.   PRIOR HPI (09/03/16): 83 year old female with hypertension, diabetes, hypercholesteremia, [name], developed new onset of left leg pain from left foot to left hip and back, with numbness and electrical shooting pain, 1 week after discharge. Patient had been doing 1 week of outpatient physical therapy when symptoms started. Symptoms worse with activity and standing. Alleviated with change in position. Patient tried to reduce physical activity slightly but symptoms of pain in left leg continued. Patient has history of low back pain radiating to the right leg in the past. Patient was prescribed gabapentin 100 mg at bedtime without relief. Also patient has now been diagnosed with atrial fibrillation based on cardiac heart monitor and has been on anticoagulation with Eliquis since 08/27/16.  DISCHARGE SUMMARY (07/31/16, Curly Rim): "83 year old Caucasian female with a past medical history of hypertension, chronic kidney disease stage II, hyperlipidemia on statin, type 2 diabetes, dementia, presented with left lower extremity weakness and dizziness. MRI showed left cerebellar stroke. Patient was hospitalized. She was outside the TPA window as determined by neurology."   REVIEW OF SYSTEMS: Full 14 system  review of systems performed and negative with exception of: dizziness.    ALLERGIES: Allergies  Allergen Reactions  . Ceclor [Cefaclor] Other (See Comments)    Unknown reaction  . Codeine Nausea And Vomiting  . Lopid [Gemfibrozil] Other (See Comments)    Unknown reaction  . Metoprolol Nausea And Vomiting  . Morphine And Related Other (See Comments)    Passed out  . Niacin And Related Other (See Comments)    Unknown reaction  . Relafen [Nabumetone] Nausea And Vomiting  . Septra [Sulfamethoxazole-Trimethoprim] Nausea And Vomiting  . Tetracycline Nausea And Vomiting  . Toprol Xl [Metoprolol Tartrate] Nausea And Vomiting    HOME MEDICATIONS: Outpatient Medications Prior to Visit  Medication Sig Dispense Refill  . acetaminophen (TYLENOL) 325 MG tablet Take 650 mg by mouth every 6 (six) hours as needed.    Marland Kitchen albuterol (PROVENTIL HFA;VENTOLIN HFA) 108 (90 Base) MCG/ACT inhaler Inhale 2 puffs into the lungs every 6 (six) hours as needed for wheezing or shortness of breath.    Marland Kitchen amLODipine (NORVASC) 2.5 MG tablet Take one tablet twice daily for blood pressure 180 tablet 3  . apixaban (ELIQUIS) 5 MG TABS tablet Take 1 tablet (5 mg total) by mouth 2 (two) times daily. 30 tablet 0  . atenolol (TENORMIN) 50 MG tablet TAKE 1 TABLET DAILY TO HELPBLOOD PRESSURE 90 tablet 2  . atorvastatin (LIPITOR) 40 MG tablet Take 1 tablet (40 mg total) by mouth daily at 6 PM. 90 tablet 1  . calcium carbonate (TUMS EX) 750 MG chewable tablet Chew 1 tablet by mouth daily as needed for heartburn.     . citalopram (CELEXA) 10 MG tablet TAKE 1/2 TABLET DAILY FOR  DEPRESSION 45  tablet 3  . gabapentin (NEURONTIN) 300 MG capsule Take 1 capsule (300 mg total) by mouth 2 (two) times daily. 180 capsule 0  . meclizine (ANTIVERT) 25 MG tablet Take 1 tablet (25 mg total) by mouth daily as needed for dizziness or nausea. 30 tablet 0  . metFORMIN (GLUCOPHAGE) 500 MG tablet TAKE 1 TABLET EVERY MORNINGFOR BLOOD SUGAR 90 tablet 2   . nitrofurantoin (MACRODANTIN) 100 MG capsule Take one capsule by mouth once daily for bladder 90 capsule 3  . Vitamin D, Ergocalciferol, (DRISDOL) 50000 units CAPS capsule TAKE 1 CAPSULE EVERY 7 DAYS 12 capsule 3  . glucose blood (ONE TOUCH ULTRA TEST) test strip Use to test blood sugar once daily DX: E11.29, E11.42 (Patient not taking: Reported on 03/28/2017) 200 each 1  . Lancets (ONETOUCH ULTRASOFT) lancets Use to test blood sugar once daily DX: E11.29, E11.42 (Patient not taking: Reported on 03/28/2017) 200 each 1   No facility-administered medications prior to visit.     PAST MEDICAL HISTORY: Past Medical History:  Diagnosis Date  . Allergic rhinitis, cause unspecified   . Anxiety state, unspecified   . Diaphragmatic hernia without mention of obstruction or gangrene   . Diverticulosis of colon (without mention of hemorrhage)   . Female stress incontinence   . Insomnia, unspecified   . Interstitial cystitis    surgery 02/01/2013  . Irritable bowel syndrome   . Memory loss   . Other and unspecified hyperlipidemia   . Other premature beats   . Polyneuropathy in diabetes(357.2)   . Postmenopausal bleeding   . Proteinuria   . Stroke ([place])   . 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 Unspecified essential hypertension   . Viral warts, unspecified     PAST SURGICAL HISTORY: Past Surgical History:  Procedure Laterality Date  . ABDOMINAL HYSTERECTOMY  1977   Dr.Hambright  . APPENDECTOMY  1965  . CATARACT EXTRACTION, BILATERAL  02/10/2006   Dr.Groat   . COLONOSCOPY  07/17/2004   polypectomy  . [place]  . LOOP RECORDER INSERTION N/A 07/31/2016   Procedure: Loop Recorder Insertion;  Surgeon: [name], MD;  Location: [facility];  Service: Cardiovascular;  Laterality: N/A;  . TEE WITHOUT CARDIOVERSION N/A 07/30/2016   Procedure: TRANSESOPHAGEAL ECHOCARDIOGRAM (TEE);   Surgeon: [name], MD;  Location: [facility];  Service: Cardiovascular;  Laterality: N/A;  . [place]  . TRIGGER FINGER RELEASE  05/13/16  . urinary bladder      surgery 02/01/2013 to stretch bladder stem    FAMILY HISTORY: Family History  Problem Relation Age of Onset  . Cancer Mother        pancreatic  . Cancer Father        bladder  . Heart attack Father   . Hypertension Sister   . Diabetes Sister   . Diabetes Brother   . Kidney cancer Brother   . Hypertension Brother   . Diabetes Brother   . Heart disease Brother 42       Myocardial Infarction     SOCIAL HISTORY:  Social History   Socioeconomic History  . Marital status: Widowed    Spouse name: Not on file  . Number of children: 1  . Years of education: Not on file  . Highest education level: Not on file  Social Needs  . Financial resource strain: Not on  file  . Food insecurity - worry: Not on file  . Food insecurity - inability: Not on file  . Transportation needs - medical: Not on file  . Transportation needs - non-medical: Not on file  Occupational History  . 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 Smoking status: Former Smoker    Packs/day: 0.25    Years: 62.00    Pack years: 15.50    Last attempt to quit: 04/02/2011    Years since quitting: 5.9  . Smokeless tobacco: Never Used  . Tobacco comment: stopped consistently smoking in 2012 but still smokes occasionally (one pack per week)  Substance and Sexual Activity  . Alcohol use: No  . Drug use: No  . Sexual activity: No  Other Topics Concern  . Not on file  Social History Narrative   Ophthalmologist-Dr.Gould and Dr.Groat   Podiatrist- Dr.Tuchman   Dermatologist- Dr.Lupton   Urologist- Dr. Lawerance Bach    Lives with her son.      PHYSICAL EXAM  GENERAL EXAM/CONSTITUTIONAL: Vitals:  Vitals:   03/28/17 0836  BP: (!) 147/74  Pulse: (!) 56  Weight: 173 lb 9.6 oz (78.7 kg)   Body  mass index is 28.02 kg/m. No exam data present  Patient is in no distress; well developed, nourished and groomed; neck is supple  CARDIOVASCULAR:  Examination of carotid arteries is normal; no carotid bruits  RIGHT SUBCLAVIAN BRUIT  Regular rate and rhythm, no murmurs  Examination of peripheral vascular system by observation and palpation is normal  EYES:  Ophthalmoscopic exam of optic discs and posterior segments is normal; no papilledema or hemorrhages  MUSCULOSKELETAL:  Gait, strength, tone, movements noted in Neurologic exam below  NEUROLOGIC: MENTAL STATUS:  MMSE - [place] Exam 05/07/2016 12/21/2013  Orientation to time 5 5  Orientation to Place 4 4  Registration 3 3  Attention/ Calculation 5 5  Recall 3 2  Language- name 2 objects 2 2  Language- repeat 1 1  Language- follow 3 step command 3 3  Language- read & follow direction 1 1  Write a sentence 1 1  Copy design 1 1  Total score 29 28    awake, alert, oriented to person, place and time  recent and remote memory intact  normal attention and concentration  language fluent, comprehension intact, naming intact,   fund of knowledge appropriate  CRANIAL NERVE:   2nd - no papilledema on fundoscopic exam  2nd, 3rd, 4th, 6th - pupils equal and reactive to light, visual fields full to confrontation, extraocular muscles intact, no nystagmus  5th - facial sensation symmetric  7th - facial strength symmetric  8th - hearing DECR  9th - palate elevates symmetrically, uvula midline  11th - shoulder shrug symmetric  12th - tongue protrusion midline  MOTOR:   normal bulk and tone  full strength in the BUE, RLE  LLE (3 PROX, 5 DISTAL)  SENSORY:   normal and symmetric to light touch  DECR VIB AT TOES, ANKLES, KNEES (SYMM)  COORDINATION:   finger-nose-finger, fine finger movements --> SLOW ON LEFT SIDE  REFLEXES:   deep tendon reflexes --> BUE 1  KNEES 2 (LEFT SLIGHTLY MORE BRISK  THAN RIGHT)  ANKLES TRACE  GAIT/STATION:   narrow based gait; SMOOTH GAIT; LEFT LEG SLIGHTLY STIFF    DIAGNOSTIC DATA (LABS, IMAGING, TESTING) - I reviewed patient records, labs, notes, testing and imaging myself where available.  Lab Results  Component Value  Date   WBC 8.1 03/17/2017   HGB 12.9 03/17/2017   HCT 38.2 03/17/2017   MCV 93.6 03/17/2017   PLT 168 03/17/2017      Component Value Date/Time   NA 139 03/17/2017 0920   NA 139 06/23/2015 0927   K 4.6 03/17/2017 0920   CL 104 03/17/2017 0920   CO2 29 03/17/2017 0920   GLUCOSE 144 (H) 03/17/2017 0920   BUN 21 03/17/2017 0920   BUN 16 06/23/2015 0927   CREATININE 1.32 (H) 03/17/2017 0920   CALCIUM 9.4 03/17/2017 0920   PROT 6.3 03/17/2017 0920   PROT 6.1 06/23/2015 0927   ALBUMIN 3.9 05/07/2016 0841   ALBUMIN 3.9 06/23/2015 0927   AST 14 03/17/2017 0920   ALT 10 03/17/2017 0920   ALKPHOS 69 05/07/2016 0841   BILITOT 0.5 03/17/2017 0920   BILITOT 0.3 06/23/2015 0927   GFRNONAA 37 (L) 03/17/2017 0920   GFRAA 43 (L) 03/17/2017 0920   Lab Results  Component Value Date   CHOL 135 03/17/2017   HDL 40 (L) 03/17/2017   LDLCALC 55 11/05/2016   TRIG 179 (H) 03/17/2017   CHOLHDL 3.4 03/17/2017   Lab Results  Component Value Date   HGBA1C 6.9 (H) 03/17/2017   No results found for: VITAMINB12 Lab Results  Component Value Date   TSH 4.432 07/29/2016    12/13/15 carotid u/s - Heterogeneous plaque, bilaterally. - 1-39% bilateral ICA stenosis. - Normal subclavian arteries, bilaterally. - Patent vertebral arteries with antegrade flow.  07/28/16 CT head [I reviewed images myself and agree with interpretation. -VRP]  1. Focal hypodensity in the left anterior, superior cerebellum. This is suspicious for a focal area of edema/ischemia. MRI recommended for further evaluation. No hemorrhage. 2. Atrophy and chronic white matter small vessel ischemic changes  3. Fluid level in the sphenoid sinus with mucosal disease in  the ethmoid sinuses.  07/28/16 MRI brain [I reviewed images myself and agree with interpretation. -VRP]  - Acute LEFT cerebellar/SCA territory nonhemorrhagic infarct corresponding to CT abnormality. - Moderate chronic small vessel ischemic disease and old bilateral small cerebellar infarcts.  07/29/16 CTA head / neck [I reviewed images myself and agree with interpretation. Also there is right proximal subclavian calcification, with good flow distally. -VRP]  1. Patent carotid and vertebral arteries. No large vessel occlusion of circle of Willis. 2. 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 3. Mild calcific atherosclerosis of carotid bifurcations and of carotid siphons without stenosis. 4. Left superior cerebellar hemisphere area of hypoattenuation compatible with infarction that is stable in comparison with prior studies. 5. No evidence for new large acute infarct, significant mass effect, or acute intracranial hemorrhage.  07/30/16 TEE - No cardiac source of emboli was indentified.  08/20/16 LLE u/s - no DVT or SVT  09/13/16 MRI of the lumbar spine shows severe multilevel degenerative changes as detailed above. The most significant findings are: 1.    At L2-L3, there is severe loss of disc height associated with endplate spurring and severe right facet hypertrophy with milder left facet and ligament flavum hypertrophy. There is moderately severe right lateral recess stenosis that could lead to right L3 nerve root compression.   There is no spinal stenosis. 2.    At L3-L4, there is severe spinal stenosis due to a combination of anterolisthesis, disc protrusion and severe facet hypertrophy with ligamentum flavum hypertrophy. Despite the severity of the spinal stenosis, there is only mild foraminal and lateral recess stenosis  that does not lead to nerve root compression. 3.   At L4-L5, there is moderately severe spinal stenosis due to a combination of anterolisthesis,  disc protrusion and severe facet hypertrophy. There is moderately severe bilateral foraminal and moderately severe bilateral lateral recess stenosis that could lead to compression of either the L4 or L5 nerve roots. 4.    Degenerative changes at L1-L2 and L5-S1 are unlikely to lead to nerve root impingement      ASSESSMENT AND PLAN  83 y.o. year old female here with hypertension, diabetes, hyperkalemia, atrial fibrillation, here for hospital stroke follow-up of embolic stroke of left superior cerebellum. Patient doing well from stroke standpoint.   However has developed new left leg pain which may be post stroke pain + left lumbar radiculopathy.   Dx:  1. Embolic stroke involving left cerebellar artery ([place])   2. Lumbar radiculopathy   3. Paroxysmal atrial fibrillation ([facility])      PLAN:  I spent 20 minutes of face to face time with patient. Greater than 50% of time was spent in counseling and coordination of care with patient. In summary we discussed:   EMBOLIC STROKE (due to atrial fibrillation) (stable, no additional workup) - continue apixaban - continue BP and heart rate control - continue statin - continue metformin  LEFT LEG PAIN (improved) --> post stroke pain + left lumbar radiculopathy - continue gabapentin to 300mg  at twice a day - tylenol 500mg  three times per day as needed  Return if symptoms worsen or fail to improve, for return to PCP.    Penni Bombard, MD 25/91/0289, 0:22 AM Certified in Neurology, Neurophysiology and Neuroimaging  [facility] [address], [place], [place] [address] [phone]

## 2017-03-31 ENCOUNTER — Other Ambulatory Visit: Payer: Self-pay | Admitting: Internal Medicine

## 2017-03-31 NOTE — Progress Notes (Signed)
Carelink Summary Report / Loop Recorder 

## 2017-04-07 LAB — CUP PACEART REMOTE DEVICE CHECK
Implantable Pulse Generator Implant Date: 20180502
MDC IDC SESS DTM: 20181229003621

## 2017-04-17 ENCOUNTER — Telehealth: Payer: Self-pay | Admitting: Internal Medicine

## 2017-04-17 NOTE — Telephone Encounter (Signed)
Left msg asking pt to confirm this AWV-S w/ nurse. Last AWV 05/07/16. VDM (DD)

## 2017-04-28 ENCOUNTER — Ambulatory Visit (INDEPENDENT_AMBULATORY_CARE_PROVIDER_SITE_OTHER): Payer: Medicare Other | Admitting: *Deleted

## 2017-04-28 ENCOUNTER — Ambulatory Visit (INDEPENDENT_AMBULATORY_CARE_PROVIDER_SITE_OTHER): Payer: Medicare Other | Admitting: Podiatry

## 2017-04-28 ENCOUNTER — Encounter: Payer: Self-pay | Admitting: Podiatry

## 2017-04-28 DIAGNOSIS — M79674 Pain in right toe(s): Secondary | ICD-10-CM | POA: Diagnosis not present

## 2017-04-28 DIAGNOSIS — B351 Tinea unguium: Secondary | ICD-10-CM | POA: Diagnosis not present

## 2017-04-28 DIAGNOSIS — I639 Cerebral infarction, unspecified: Secondary | ICD-10-CM

## 2017-04-28 DIAGNOSIS — M79675 Pain in left toe(s): Secondary | ICD-10-CM | POA: Diagnosis not present

## 2017-04-28 DIAGNOSIS — D689 Coagulation defect, unspecified: Secondary | ICD-10-CM

## 2017-04-28 NOTE — Progress Notes (Signed)
Carelink Summary Report / Loop Recorder 

## 2017-04-28 NOTE — Progress Notes (Signed)
Subjective:   Patient ID: Penny Miller, female   DOB: 84 y.o.   MRN: [id number]   HPI Patient presents with incurvated nails 1-5 both feet that are red in the corners and painful with palpation.  There are thickened and impossible for her to cut and she is on blood thinner   ROS      Objective:  Physical Exam  Neurovascular status intact with incurvated nailbeds 1-5 both feet with thickness and pain with palpation     Assessment:  At risk patient with mycotic nail infection 1-5 both feet     Plan:  Debride painful nailbeds 1-5 both feet with no iatrogenic bleeding and reappoint for routine care

## 2017-05-12 ENCOUNTER — Ambulatory Visit (INDEPENDENT_AMBULATORY_CARE_PROVIDER_SITE_OTHER): Payer: Medicare Other | Admitting: Internal Medicine

## 2017-05-12 ENCOUNTER — Encounter: Payer: Self-pay | Admitting: Internal Medicine

## 2017-05-12 VITALS — BP 158/70 | HR 48 | Temp 97.7°F | Wt 170.0 lb

## 2017-05-12 DIAGNOSIS — R151 Fecal smearing: Secondary | ICD-10-CM | POA: Diagnosis not present

## 2017-05-12 DIAGNOSIS — R1033 Periumbilical pain: Secondary | ICD-10-CM

## 2017-05-12 DIAGNOSIS — R531 Weakness: Secondary | ICD-10-CM

## 2017-05-12 DIAGNOSIS — I639 Cerebral infarction, unspecified: Secondary | ICD-10-CM | POA: Diagnosis not present

## 2017-05-12 DIAGNOSIS — R197 Diarrhea, unspecified: Secondary | ICD-10-CM

## 2017-05-12 LAB — CUP PACEART REMOTE DEVICE CHECK
Date Time Interrogation Session: 20190128010710
Implantable Pulse Generator Implant Date: 20180502

## 2017-05-12 NOTE — Patient Instructions (Addendum)
Stop your metformin. Collect a stool sample for [place] to check for antibiotic-associated diarrhea and other infection in your stool--bring the stool sample back to the lab as soon as possible. Stay off the nitrofurantoin for the urine.   Ok to stay off of the probiotic.    If you get better off the metfomin (expect this will only take 2-3 days), let me know and we can put you on a different medicine for your diabetes.

## 2017-05-12 NOTE — Progress Notes (Signed)
Location:  [facility] clinic Provider: Josedaniel Haye L. Mariea Clonts, D.O., C.M.D.  Code Status: full code Goals of Care:  Advanced Directives 05/12/2017  Does Patient Have a Medical Advance Directive? No  Type of Advance Directive -  Does patient want to make changes to medical advance directive? -  Copy of [place] in Chart? -  Would patient like information on creating a medical advance directive? No - Patient declined   Chief Complaint  Patient presents with  . Acute Visit    constipation, diarrhea    HPI: Patient is a 84 y.o. female seen today for an acute visit for weakness and diarrhea. Tried probiotic--took until last Friday when she just felt like it wasn't working anymore. Had diarrhea for almost a week two weeks ago.  Thinks she was dehydrated and kept drinking water.  Just doesn't feel good.  Can't get going.  Says spends a lot of time in the restroom cleaning herself. She says there's an odor and there's a medicinal smell to it.  Stools are yellow like baby stool.  Wanted to take a big dose of milk of magnesia and get rid of it all.  Yesterday, she was sick on her stomach and was near tears.  Says she can bend over and have a bm.  Only small amts come at a time.  Refused to stop metformin before.  Got depends, but had to still change.  Got pads instead and they are helpful.  9/10 times if she urinates, she also has to have a bm.  Has been taking 1/2 metformin bid.  No blood in stool.  Not dark.  Not slimy or mucus in it.  Feels like she has more BM than she takes in.  Says she has been getting bloating.  Says her mother had cancer of the pancreas and her stool didn't come out normal.  She has quit taking the nitrofurantoin since she started the probiotic about a month ago.    She's lost 3.6 lbs since 03/28/17 appt.  Past Medical History:  Diagnosis Date  . Allergic rhinitis, cause unspecified   . Anxiety state, unspecified   . Diaphragmatic hernia without mention of  obstruction or gangrene   . Diverticulosis of colon (without mention of hemorrhage)   . Female stress incontinence   . Insomnia, unspecified   . Interstitial cystitis    surgery 02/01/2013  . Irritable bowel syndrome   . Memory loss   . Other and unspecified hyperlipidemia   . Other premature beats   . Polyneuropathy in diabetes(357.2)   . Postmenopausal bleeding   . Proteinuria   . Stroke ([place])   . 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 Unspecified essential hypertension   . Viral warts, unspecified     Past Surgical History:  Procedure Laterality Date  . ABDOMINAL HYSTERECTOMY  1977   Dr.Hambright  . APPENDECTOMY  1965  . CATARACT EXTRACTION, BILATERAL  02/10/2006   Dr.Groat   . COLONOSCOPY  07/17/2004   polypectomy  . [place]  . LOOP RECORDER INSERTION N/A 07/31/2016   Procedure: Loop Recorder Insertion;  Surgeon: [name], MD;  Location: [facility];  Service: Cardiovascular;  Laterality: N/A;  . TEE WITHOUT CARDIOVERSION N/A 07/30/2016   Procedure: TRANSESOPHAGEAL ECHOCARDIOGRAM (TEE);  Surgeon: [name], MD;  Location: [facility];  Service: Cardiovascular;  Laterality: N/A;  . TRIGGER FINGER RELEASE  1990   Dr.Carter  . TRIGGER FINGER RELEASE  05/13/16  . urinary bladder      surgery 02/01/2013 to stretch bladder stem    Allergies  Allergen Reactions  . Ceclor [Cefaclor] Other (See Comments)    Unknown reaction  . Codeine Nausea And Vomiting  . Lopid [Gemfibrozil] Other (See Comments)    Unknown reaction  . Metoprolol Nausea And Vomiting  . Morphine And Related Other (See Comments)    Passed out  . Niacin And Related Other (See Comments)    Unknown reaction  . Relafen [Nabumetone] Nausea And Vomiting  . Septra [Sulfamethoxazole-Trimethoprim] Nausea And Vomiting  . Tetracycline Nausea And Vomiting  . Toprol Xl [Metoprolol Tartrate] Nausea  And Vomiting    Outpatient Encounter Medications as of 05/12/2017  Medication Sig  . acetaminophen (TYLENOL) 325 MG tablet Take 650 mg by mouth every 6 (six) hours as needed.  Marland Kitchen albuterol (PROVENTIL HFA;VENTOLIN HFA) 108 (90 Base) MCG/ACT inhaler Inhale 2 puffs into the lungs every 6 (six) hours as needed for wheezing or shortness of breath.  Marland Kitchen amLODipine (NORVASC) 2.5 MG tablet Take one tablet twice daily for blood pressure  . apixaban (ELIQUIS) 5 MG TABS tablet Take 1 tablet (5 mg total) by mouth 2 (two) times daily.  Marland Kitchen atenolol (TENORMIN) 50 MG tablet TAKE 1 TABLET DAILY TO HELPBLOOD PRESSURE  . atorvastatin (LIPITOR) 40 MG tablet Take 1 tablet (40 mg total) by mouth daily at 6 PM.  . calcium carbonate (TUMS EX) 750 MG chewable tablet Chew 1 tablet by mouth daily as needed for heartburn.   . citalopram (CELEXA) 10 MG tablet TAKE 1/2 TABLET DAILY FOR  DEPRESSION  . gabapentin (NEURONTIN) 300 MG capsule Take 1 capsule (300 mg total) by mouth 2 (two) times daily.  . meclizine (ANTIVERT) 25 MG tablet Take 1 tablet (25 mg total) by mouth daily as needed for dizziness or nausea.  . metFORMIN (GLUCOPHAGE) 500 MG tablet TAKE 1 TABLET EVERY MORNINGFOR BLOOD SUGAR  . nitrofurantoin (MACRODANTIN) 100 MG capsule Take one capsule by mouth once daily for bladder  . Vitamin D, Ergocalciferol, (DRISDOL) 50000 units CAPS capsule TAKE 1 CAPSULE WEEKLY FOR  VITAMIN D  . [DISCONTINUED] glucose blood (ONE TOUCH ULTRA TEST) test strip Use to test blood sugar once daily DX: E11.29, E11.42 (Patient not taking: Reported on 03/28/2017)  . [DISCONTINUED] Lancets (ONETOUCH ULTRASOFT) lancets Use to test blood sugar once daily DX: E11.29, E11.42 (Patient not taking: Reported on 03/28/2017)  . [DISCONTINUED] Vitamin D, Ergocalciferol, (DRISDOL) 50000 units CAPS capsule TAKE 1 CAPSULE EVERY 7 DAYS   No facility-administered encounter medications on file as of 05/12/2017.     Review of Systems:  Review of Systems    Constitutional: Positive for malaise/fatigue. Negative for chills and fever.  HENT: Negative for congestion and hearing loss.   Eyes: Negative for blurred vision.  Respiratory: Negative for cough and shortness of breath.        Reports wheezing with walking  Cardiovascular: Negative for chest pain, palpitations and leg swelling.  Gastrointestinal: Positive for abdominal pain and diarrhea. Negative for blood in stool, constipation, heartburn, melena, nausea and vomiting.  Genitourinary: Negative for dysuria.  Musculoskeletal: Negative for falls.  Skin: Negative for itching and rash.  Neurological: Positive for dizziness and weakness. Negative for loss of consciousness.       Woozy feeling; chronic dysarthria and some word-finding difficulty since stroke she had  Endo/Heme/Allergies: Does not bruise/bleed easily.  Psychiatric/Behavioral: Negative for depression and  memory loss.    Health Maintenance  Topic Date Due  . FOOT EXAM  06/22/2015  . OPHTHALMOLOGY EXAM  12/25/2016  . PNA vac Low Risk Adult (2 of 2 - PPSV23) 05/07/2017  . HEMOGLOBIN A1C  09/15/2017  . URINE MICROALBUMIN  11/05/2017  . TETANUS/TDAP  09/15/2018  . INFLUENZA VACCINE  Completed  . DEXA SCAN  Completed    Physical Exam: Vitals:   05/12/17 1342  BP: (!) 158/70  Pulse: (!) 48  Temp: 97.7 F (36.5 C)  TempSrc: Oral  SpO2: 97%  Weight: 170 lb (77.1 kg)   Body mass index is 27.44 kg/m. Physical Exam  Constitutional: She appears well-developed and well-nourished. No distress.  Cardiovascular: Normal rate, regular rhythm, normal heart sounds and intact distal pulses.  Pulmonary/Chest: Effort normal and breath sounds normal. No respiratory distress. She has no wheezes.  Abdominal: Bowel sounds are normal.  Musculoskeletal: Normal range of motion.  Neurological: She is alert.  Lightheaded and tripped over her right foot while walking down the hall to the lab  Skin: There is pallor.  Psychiatric: She has a  normal mood and affect. Her behavior is normal.    Labs reviewed: Basic Metabolic Panel: Recent Labs    07/28/16 1917 07/29/16 0414 11/05/16 0906 03/17/17 0920  NA 133*  --  138 139  K 4.0  --  4.7 4.6  CL 100*  --  104 104  CO2 24  --  22 29  GLUCOSE 140*  --  129* 144*  BUN 15  --  14 21  CREATININE 1.01*  --  1.18* 1.32*  CALCIUM 9.4  --  9.6 9.4  TSH  --  4.432  --   --    Liver Function Tests: Recent Labs    03/17/17 0920  AST 14  ALT 10  BILITOT 0.5  PROT 6.3   No results for input(s): LIPASE, AMYLASE in the last 8760 hours. No results for input(s): AMMONIA in the last 8760 hours. CBC: Recent Labs    07/08/16 1505 07/28/16 1917 09/27/16 1052 03/17/17 0920  WBC 11.6* 8.8 10.4 8.1  NEUTROABS 9.0* 6.3  --  5,532  HGB 13.5 13.0 12.9 12.9  HCT 40.5 38.8 38.7 38.2  MCV 95.3 93.7 96.5 93.6  PLT 177 166 172 168   Lipid Panel: Recent Labs    07/29/16 0414 11/05/16 0906 03/17/17 0920  CHOL 162 127 135  HDL 38* 43* 40*  LDLCALC 81 55  --   TRIG 217* 146 179*  CHOLHDL 4.3 3.0 3.4   Lab Results  Component Value Date   HGBA1C 6.9 (H) 03/17/2017    Assessment/Plan 1. Diarrhea, unspecified type - suspect either metformin-induced (so stop metformin and monitor stools and let me know if better after 2-3 days as would expect) or c diff from abx for her UTI prophylaxis she's been on long-term - Stool culture; Future   - Clostridium difficile Toxin B, Qualitative, Real-Time PCR([facility]); Future - CBC with Differential/Platelet - COMPLETE METABOLIC PANEL WITH GFR - Amylase - Lipase  2. Fecal smearing - ever since loose stools began several months ago, workup as below - Stool culture; Future - Clostridium difficile Toxin B, Qualitative, Real-Time PCR([facility]); Future - CBC with Differential/Platelet - COMPLETE METABOLIC PANEL WITH GFR - Amylase - Lipase  3. Weakness generalized - due to frequent loose stools, it seems, need to solve this as above, be  careful, move around slowly and hydrate - Stool culture; Future - Clostridium difficile  Toxin B, Qualitative, Real-Time PCR([facility]); Future - CBC with Differential/Platelet - COMPLETE METABOLIC PANEL WITH GFR - Amylase - Lipase  4. Periumbilical abdominal pain - sometimes before bms, but not consistent - COMPLETE METABOLIC PANEL WITH GFR - Amylase - Lipase  Labs/tests ordered:   Orders Placed This Encounter  Procedures  . Stool culture    Standing Status:   Future    Standing Expiration Date:   05/12/2018  . Clostridium difficile Toxin B, Qualitative, Real-Time PCR([facility])    Standing Status:   Future    Standing Expiration Date:   05/12/2018  . CBC with Differential/Platelet  . COMPLETE METABOLIC PANEL WITH GFR  . Amylase  . Lipase   Next appt:  07/17/2017 keep regular as scheduled and return prn.  Leocadio Heal L. Remus Hagedorn, D.O. [facility] [address], [place] [address] Cell Phone (Mon-Fri 8am-5pm):  [phone] On Call:  [phone] & follow prompts after 5pm & weekends Office Phone:  [phone] Office Fax:  [phone]

## 2017-05-13 ENCOUNTER — Other Ambulatory Visit: Payer: Self-pay

## 2017-05-13 DIAGNOSIS — R197 Diarrhea, unspecified: Secondary | ICD-10-CM | POA: Diagnosis not present

## 2017-05-13 DIAGNOSIS — R151 Fecal smearing: Secondary | ICD-10-CM | POA: Diagnosis not present

## 2017-05-13 DIAGNOSIS — R531 Weakness: Secondary | ICD-10-CM

## 2017-05-13 LAB — CBC WITH DIFFERENTIAL/PLATELET
Basophils Absolute: 58 cells/uL (ref 0–200)
Basophils Relative: 0.6 %
Eosinophils Absolute: 134 cells/uL (ref 15–500)
Eosinophils Relative: 1.4 %
HCT: 38.5 % (ref 35.0–45.0)
Hemoglobin: 13.2 g/dL (ref 11.7–15.5)
Lymphs Abs: 2131 cells/uL (ref 850–3900)
MCH: 31.3 pg (ref 27.0–33.0)
MCHC: 34.3 g/dL (ref 32.0–36.0)
MCV: 91.2 fL (ref 80.0–100.0)
MPV: 13.3 fL — ABNORMAL HIGH (ref 7.5–12.5)
Monocytes Relative: 10 %
Neutro Abs: 6317 cells/uL (ref 1500–7800)
Neutrophils Relative %: 65.8 %
Platelets: 178 10*3/uL (ref 140–400)
RBC: 4.22 10*6/uL (ref 3.80–5.10)
RDW: 13 % (ref 11.0–15.0)
Total Lymphocyte: 22.2 %
WBC mixed population: 960 cells/uL — ABNORMAL HIGH (ref 200–950)
WBC: 9.6 10*3/uL (ref 3.8–10.8)

## 2017-05-13 LAB — COMPLETE METABOLIC PANEL WITH GFR
AG Ratio: 1.5 (calc) (ref 1.0–2.5)
ALT: 10 U/L (ref 6–29)
AST: 11 U/L (ref 10–35)
Albumin: 4 g/dL (ref 3.6–5.1)
Alkaline phosphatase (APISO): 122 U/L (ref 33–130)
BUN/Creatinine Ratio: 15 (calc) (ref 6–22)
BUN: 16 mg/dL (ref 7–25)
CO2: 29 mmol/L (ref 20–32)
Calcium: 9.7 mg/dL (ref 8.6–10.4)
Chloride: 104 mmol/L (ref 98–110)
Creat: 1.09 mg/dL — ABNORMAL HIGH (ref 0.60–0.88)
GFR, Est African American: 54 mL/min/{1.73_m2} — ABNORMAL LOW (ref >=60)
GFR, Est Non African American: 47 mL/min/{1.73_m2} — ABNORMAL LOW (ref >=60)
Globulin: 2.6 g/dL (calc) (ref 1.9–3.7)
Glucose, Bld: 101 mg/dL (ref 65–139)
Potassium: 4.3 mmol/L (ref 3.5–5.3)
Sodium: 139 mmol/L (ref 135–146)
Total Bilirubin: 0.4 mg/dL (ref 0.2–1.2)
Total Protein: 6.6 g/dL (ref 6.1–8.1)

## 2017-05-13 LAB — AMYLASE: Amylase: 33 U/L (ref 21–101)

## 2017-05-13 LAB — LIPASE: Lipase: 70 U/L — ABNORMAL HIGH (ref 7–60)

## 2017-05-14 LAB — CLOSTRIDIUM DIFFICILE TOXIN B, QUALITATIVE, REAL-TIME PCR: Toxigenic C. Difficile by PCR: NOT DETECTED

## 2017-05-16 ENCOUNTER — Telehealth: Payer: Self-pay | Admitting: *Deleted

## 2017-05-16 MED ORDER — LINAGLIPTIN 5 MG PO TABS: 5.0000 mg | ORAL_TABLET | Freq: Every day | ORAL | 0 refills | Status: DC

## 2017-05-16 NOTE — Telephone Encounter (Signed)
-----   Message from [name], DO sent at 05/15/2017  1:00 PM EST ----- Let's start her on tradjenta 5mg  po daily with her largest meal and see how that does for her sugars.  Does she have means of checking her sugar at home fasting each morning for the next week and then calling with results?  If not, we'll use hba1c in 3 mos from last check instead of 4 to see where her sugars are averaging.

## 2017-05-16 NOTE — Telephone Encounter (Signed)
rx sent to pharmacy by e-script  

## 2017-05-17 LAB — STOOL CULTURE
MICRO NUMBER:: 90189114
MICRO NUMBER:: 90189115
MICRO NUMBER:: 90189116
SHIGA RESULT:: NOT DETECTED
SPECIMEN QUALITY:: ADEQUATE
SPECIMEN QUALITY:: ADEQUATE
SPECIMEN QUALITY:: ADEQUATE

## 2017-05-26 DIAGNOSIS — N3020 Other chronic cystitis without hematuria: Secondary | ICD-10-CM | POA: Diagnosis not present

## 2017-05-26 DIAGNOSIS — N3010 Interstitial cystitis (chronic) without hematuria: Secondary | ICD-10-CM | POA: Diagnosis not present

## 2017-05-27 DIAGNOSIS — N3010 Interstitial cystitis (chronic) without hematuria: Secondary | ICD-10-CM | POA: Diagnosis not present

## 2017-05-30 ENCOUNTER — Ambulatory Visit (INDEPENDENT_AMBULATORY_CARE_PROVIDER_SITE_OTHER): Payer: Medicare Other | Admitting: *Deleted

## 2017-05-30 DIAGNOSIS — I639 Cerebral infarction, unspecified: Secondary | ICD-10-CM

## 2017-06-02 NOTE — Progress Notes (Signed)
Carelink Summary Report / Loop Recorder 

## 2017-06-03 ENCOUNTER — Telehealth: Payer: Self-pay | Admitting: *Deleted

## 2017-06-03 NOTE — Telephone Encounter (Signed)
Patient notified and agreed. Appointment scheduled with [facility] for Monday 06/09/17. Patient will wait to have Tradjenta called in at that time.

## 2017-06-03 NOTE — Telephone Encounter (Signed)
I would like to try her on trulicity which is a weekly injection.  Is she willing to try that?  We can have her see Cathey for teaching on using the injection.  She should continue the tradjenta so please send it into [facility].

## 2017-06-03 NOTE — Telephone Encounter (Signed)
Patient called and stated the symptoms from the Metformin has gone away. She in on the [place] but patient feels it is not keeping her blood sugars down like the Metformin did. Will need a Rx called into [facility] for the Tradjenta. Please Advise  Blood Sugars 05/17/17-  9:16am  189 05/18/17-  9:26am  162 05/19/17- 10:06am 123 05/20/17- 10:30am 150 05/21/17- 11:00am 117 05/23/17- 10:58am 144 05/24/17-  4:02pm  137 05/25/17- 11:30am 157 05/26/17- 10:00am 130 05/27/17- 10:42am 141 05/28/17- 11:00am 152 05/29/17- 11:08am 163 05/30/17-  9:00pm  138 05/31/17-  6:10pm  122 06/01/17-  9:00pm  163 06/02/17- 10:45pm 120 06/03/17- 11:00am 161

## 2017-06-05 ENCOUNTER — Other Ambulatory Visit: Payer: Self-pay | Admitting: Internal Medicine

## 2017-06-09 ENCOUNTER — Encounter: Payer: Self-pay | Admitting: Pharmacotherapy

## 2017-06-09 ENCOUNTER — Ambulatory Visit (INDEPENDENT_AMBULATORY_CARE_PROVIDER_SITE_OTHER): Payer: Medicare Other | Admitting: Pharmacotherapy

## 2017-06-09 VITALS — BP 148/80 | HR 56 | Ht 66.0 in | Wt 174.0 lb

## 2017-06-09 DIAGNOSIS — I639 Cerebral infarction, unspecified: Secondary | ICD-10-CM

## 2017-06-09 DIAGNOSIS — I10 Essential (primary) hypertension: Secondary | ICD-10-CM | POA: Diagnosis not present

## 2017-06-09 DIAGNOSIS — R197 Diarrhea, unspecified: Secondary | ICD-10-CM

## 2017-06-09 DIAGNOSIS — E1129 Type 2 diabetes mellitus with other diabetic kidney complication: Secondary | ICD-10-CM

## 2017-06-09 MED ORDER — DULAGLUTIDE 1.5 MG/0.5ML ~~LOC~~ SOAJ: 1.5000 mg | SUBCUTANEOUS | 3 refills | Status: AC

## 2017-06-09 NOTE — Patient Instructions (Addendum)
Call if you have questions Stop Tradjenta. Start Trulicity every Monday

## 2017-06-09 NOTE — Progress Notes (Signed)
  Subjective:    Penny Miller is a 84 y.o. female who presents for follow-up of Type 2 diabetes mellitus.   She had chronic diarrhea that has resolved since stopping her metformin. Last A1C was 6.9% in December 2018. Dr Mariea Clonts feels she is a good candidate for Trulicity.  She is nervous about potential hypoglycemia  Some blurry vision.  She feels visual problems are related to past CVA. Eye exam is due. Denies problems with feet. Some peripheral edema. Staying well hydrated with water. Nocturia every 2 hours  No dysuria.    Review of Systems A comprehensive review of systems was negative except for: Eyes: positive for blurry vision Cardiovascular: positive for lower extremity edema Genitourinary: positive for nocturia    Objective:    BP (!) 148/80   Pulse (!) 56   Ht 5' 6"$  (1.676 m)   Wt 174 lb (78.9 kg)   SpO2 96%   BMI 28.08 kg/m   General:  alert, cooperative and no distress  Oropharynx: normal findings: lips normal without lesions and gums healthy   Eyes:  negative findings: lids and lashes normal and conjunctivae and sclerae normal   Ears:  external ears normal        Lung: clear to auscultation bilaterally  Heart:  regular rate and rhythm     Extremities: edema bilateral lower extremities  Skin: warm and dry, no hyperpigmentation, vitiligo, or suspicious lesions     Neuro: mental status, speech normal, alert and oriented x3 and gait and station normal   Lab Review Glucose, Bld (mg/dL)  Date Value  05/12/2017 101  03/17/2017 144 (H)  11/05/2016 129 (H)   CO2 (mmol/L)  Date Value  05/12/2017 29  03/17/2017 29  11/05/2016 22   BUN (mg/dL)  Date Value  05/12/2017 16  03/17/2017 21  11/05/2016 14  06/23/2015 16  02/22/2015 17  10/17/2014 20   Creat (mg/dL)  Date Value  05/12/2017 1.09 (H)  03/17/2017 1.32 (H)  11/05/2016 1.18 (H)       Assessment:    Diabetes Mellitus type II, under good control.  A1C at goal <7% BP above target  <130/80 today Diarrhea has resolved after stopping metformin   Plan:    1.  Rx changes: start Trulicity [id number] once a week x 2, then increase to 1.81m once a week.  Counseled on risk/ benefit.  Taught her how to self inject.  First dose given in offfice with sample provided.   Stop Tradjenta. 2.  Counseled on very minimal risk of hypoglycemia with Trulicity. 3.  Diarrhea has resolved after stopping metformin and Macrodantin. 4.  Advised her to not restart Macrodantin at this time.  She is asymptomatic for urinary symptoms without it. 5.  BP above target today, but improved from last OV.  Will continue current RX and monitor. 6.  Keep scheduled OV with Dr RMariea Clontsin April.

## 2017-06-11 ENCOUNTER — Telehealth: Payer: Self-pay | Admitting: Cardiology

## 2017-06-11 NOTE — Telephone Encounter (Signed)
Spoke w/ pt and requested that she send a manual transmission b/c her home monitor has not updated in at least 14 days.   

## 2017-06-14 ENCOUNTER — Other Ambulatory Visit (HOSPITAL_COMMUNITY): Payer: Self-pay | Admitting: Nurse Practitioner

## 2017-06-14 ENCOUNTER — Other Ambulatory Visit: Payer: Self-pay | Admitting: Internal Medicine

## 2017-06-15 ENCOUNTER — Emergency Department (HOSPITAL_COMMUNITY): Payer: Medicare Other

## 2017-06-15 ENCOUNTER — Emergency Department (HOSPITAL_COMMUNITY)
Admission: EM | Admit: 2017-06-15 | Discharge: 2017-06-15 | Disposition: A | Discharge status: Home / Self Care | Payer: Medicare Other | Attending: Emergency Medicine | Admitting: Emergency Medicine

## 2017-06-15 ENCOUNTER — Encounter (HOSPITAL_COMMUNITY): Payer: Self-pay

## 2017-06-15 DIAGNOSIS — Z7984 Long term (current) use of oral hypoglycemic drugs: Secondary | ICD-10-CM | POA: Diagnosis not present

## 2017-06-15 DIAGNOSIS — R5383 Other fatigue: Secondary | ICD-10-CM | POA: Diagnosis not present

## 2017-06-15 DIAGNOSIS — N182 Chronic kidney disease, stage 2 (mild): Secondary | ICD-10-CM | POA: Insufficient documentation

## 2017-06-15 DIAGNOSIS — R531 Weakness: Secondary | ICD-10-CM | POA: Diagnosis not present

## 2017-06-15 DIAGNOSIS — Z7901 Long term (current) use of anticoagulants: Secondary | ICD-10-CM | POA: Insufficient documentation

## 2017-06-15 DIAGNOSIS — E1122 Type 2 diabetes mellitus with diabetic chronic kidney disease: Secondary | ICD-10-CM | POA: Insufficient documentation

## 2017-06-15 DIAGNOSIS — Z87891 Personal history of nicotine dependence: Secondary | ICD-10-CM | POA: Insufficient documentation

## 2017-06-15 DIAGNOSIS — Z79899 Other long term (current) drug therapy: Secondary | ICD-10-CM | POA: Diagnosis not present

## 2017-06-15 DIAGNOSIS — H6121 Impacted cerumen, right ear: Secondary | ICD-10-CM | POA: Insufficient documentation

## 2017-06-15 DIAGNOSIS — I129 Hypertensive chronic kidney disease with stage 1 through stage 4 chronic kidney disease, or unspecified chronic kidney disease: Secondary | ICD-10-CM | POA: Insufficient documentation

## 2017-06-15 DIAGNOSIS — R0602 Shortness of breath: Secondary | ICD-10-CM | POA: Insufficient documentation

## 2017-06-15 DIAGNOSIS — R42 Dizziness and giddiness: Secondary | ICD-10-CM | POA: Diagnosis not present

## 2017-06-15 DIAGNOSIS — M6281 Muscle weakness (generalized): Secondary | ICD-10-CM | POA: Insufficient documentation

## 2017-06-15 DIAGNOSIS — R069 Unspecified abnormalities of breathing: Secondary | ICD-10-CM | POA: Diagnosis not present

## 2017-06-15 LAB — URINALYSIS, ROUTINE W REFLEX MICROSCOPIC
Bacteria, UA: NONE SEEN
Bilirubin Urine: NEGATIVE
Glucose, UA: NEGATIVE mg/dL
Ketones, ur: NEGATIVE mg/dL
Nitrite: NEGATIVE
Protein, ur: NEGATIVE mg/dL
Specific Gravity, Urine: 1.005 (ref 1.005–1.030)
pH: 8 (ref 5.0–8.0)

## 2017-06-15 LAB — CBC
HCT: 30.9 % — ABNORMAL LOW (ref 36.0–46.0)
Hemoglobin: 10.2 g/dL — ABNORMAL LOW (ref 12.0–15.0)
MCH: 31.9 pg (ref 26.0–34.0)
MCHC: 33 g/dL (ref 30.0–36.0)
MCV: 96.6 fL (ref 78.0–100.0)
Platelets: 166 10*3/uL (ref 150–400)
RBC: 3.2 MIL/uL — ABNORMAL LOW (ref 3.87–5.11)
RDW: 13.6 % (ref 11.5–15.5)
WBC: 7.9 10*3/uL (ref 4.0–10.5)

## 2017-06-15 LAB — BASIC METABOLIC PANEL
Anion gap: 9 (ref 5–15)
BUN: 14 mg/dL (ref 6–20)
CO2: 20 mmol/L — ABNORMAL LOW (ref 22–32)
Calcium: 8.4 mg/dL — ABNORMAL LOW (ref 8.9–10.3)
Chloride: 107 mmol/L (ref 101–111)
Creatinine, Ser: 1.09 mg/dL — ABNORMAL HIGH (ref 0.44–1.00)
GFR calc Af Amer: 53 mL/min — ABNORMAL LOW (ref >60)
GFR calc non Af Amer: 46 mL/min — ABNORMAL LOW (ref >60)
Glucose, Bld: 91 mg/dL (ref 65–99)
Potassium: 3.8 mmol/L (ref 3.5–5.1)
Sodium: 136 mmol/L (ref 135–145)

## 2017-06-15 LAB — I-STAT TROPONIN, ED: TROPONIN I, POC: 0.01 ng/mL (ref 0.00–0.08)

## 2017-06-15 LAB — CBG MONITORING, ED: Glucose-Capillary: 91 mg/dL (ref 65–99)

## 2017-06-15 MED ORDER — MECLIZINE HCL 25 MG PO TABS: 25.0000 mg | ORAL_TABLET | Freq: Three times a day (TID) | ORAL | 0 refills | Status: DC | PRN

## 2017-06-15 NOTE — ED Notes (Signed)
Patient transported to CT 

## 2017-06-15 NOTE — ED Triage Notes (Signed)
To room via EMS.  Pt reports she has not felt good in the past several days.  Onset 7:30am vertigo with nausea.  Pt took Meclizine with no relief.  Dizziness has improved since onset.  Pt reports since having stroke April 2018 she has had a cough and wheezing.  Pt was put on bedpan on admission to ED and got short of breath with all the exertion.  Pt started new medication shot 06-09-17, Tradjenta that is a weekly shot.

## 2017-06-15 NOTE — Discharge Instructions (Addendum)
Your evaluation today has been reassuring, MRI shows no evidence of new stroke, vertigo likely due to a problem with the inner ear or the cerumen impaction we removed today.  You may use meclizine as needed for dizziness.  Please follow-up with your primary care doctor as well as your neurologist.  If you develop worsening dizziness, severe nausea vomiting weakness, or numbness, difficulty with swallowing or speech or facial drooping please return to the emergency department for reevaluation.  Your blood pressure was elevated today, please follow up with your primary doctor in 1 week for blood pressure recheck.

## 2017-06-15 NOTE — ED Notes (Signed)
Pt ambulated to bathroom independently w/ a steady gait. Denied feeling dizzy.

## 2017-06-15 NOTE — ED Notes (Signed)
Returned to room.

## 2017-06-15 NOTE — ED Provider Notes (Signed)
[facility] EMERGENCY DEPARTMENT Provider Note   CSN: [id number] Arrival date & time: 06/15/17  1559     History   Chief Complaint Chief Complaint  Patient presents with  . Dizziness    HPI Penny Miller is a 84 y.o. female.  Penny Miller is a 85 y.o. 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 Pt describes it as room spinning dizziness, and has had no relief with meclizine this morning, but dizziness has since started to improve. Pt reports these symptoms are very similar to the previous stroke, last known normal was 8:30 PM last night when she went to bed. Pt denies any vision changes, or focal numbness, weakness or tingling, but reports she feels generally weak and fatigued.  Patient reports dizziness is worse with turning her head when she turns her head she feels a pressure and ache in the back of her head. Pt has not had freqeunt episodes of dizziness since her stroke.  Patient denies neck stiffness, no fevers or chills.  Patient denies any vomiting, diarrhea or abdominal pain, no chest pain.  Patient does report over the last several months she has been experiencing some exertional dyspnea, last, no associated chest pains, lightheadedness, syncope patient denies any difficulty breathing at this time, reports she has little energy to get up and down steps and get around her house, has follow-up appointment coming up with your primary care doctor regarding this.  Patient reports she lives with her son and a few other family members.  Denies any recent ear pain, drainage or URI symptoms.      Past Medical History:  Diagnosis Date  . Allergic rhinitis, cause unspecified   . Anxiety state, unspecified   . Diaphragmatic hernia without mention of obstruction or gangrene   . Diverticulosis of colon  (without mention of hemorrhage)   . Female stress incontinence   . Insomnia, unspecified   . Interstitial cystitis    surgery 02/01/2013  . Irritable bowel syndrome   . Memory loss   . Other and unspecified hyperlipidemia   . Other premature beats   . Polyneuropathy in diabetes(357.2)   . Postmenopausal bleeding   . Proteinuria   . Stroke ([place])   . 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 Unspecified essential hypertension   . Viral warts, unspecified     Patient Active Problem List   Diagnosis Date Noted  . Overweight (BMI 25.0-29.9) 11/07/2016  . Paroxysmal atrial fibrillation ([place]) 09/03/2016  . Diabetic polyneuropathy associated with type 2 diabetes mellitus ([place]) 09/03/2016  . Aneurysm of vertebral artery ([place]) 08/12/2016  . Embolic stroke involving left cerebellar artery ([place]) 07/29/2016  . CKD (chronic kidney disease) stage 2, GFR 60-89 ml/min 05/08/2016  . Palpitations 06/27/2015  . Dyspnea 06/27/2015  . Trigger finger 06/27/2015  . History of fall 02/28/2015  . DM (diabetes mellitus), type 2 with renal complications ([place]) 14/43/1540  . Sinusitis, chronic 06/22/2014  . Varicose vein of leg 12/21/2013  . Seborrheic keratosis 12/21/2013  . Deaf 06/15/2013  . Bladder infection, chronic 01/04/2013  . Polyneuropathy in diabetes(357.2)   . Memory loss   . Insomnia, unspecified   . Essential hypertension   . Lipoma of unspecified site   . Hyperlipidemia   .  Pain in joint, ankle and foot 11/20/2012  . Herpes zoster infection of thoracic region 08/28/2012  . Chronic interstitial cystitis     Past Surgical History:  Procedure Laterality Date  . ABDOMINAL HYSTERECTOMY  1977   Dr.Hambright  . APPENDECTOMY  1965  . CATARACT EXTRACTION, BILATERAL  02/10/2006   Dr.Groat   . COLONOSCOPY  07/17/2004   polypectomy  . [place]  . LOOP RECORDER INSERTION N/A 07/31/2016    Procedure: Loop Recorder Insertion;  Surgeon: [name], MD;  Location: [facility];  Service: Cardiovascular;  Laterality: N/A;  . TEE WITHOUT CARDIOVERSION N/A 07/30/2016   Procedure: TRANSESOPHAGEAL ECHOCARDIOGRAM (TEE);  Surgeon: [name], MD;  Location: [facility];  Service: Cardiovascular;  Laterality: N/A;  . [place]  . TRIGGER FINGER RELEASE  05/13/16  . urinary bladder      surgery 02/01/2013 to stretch bladder stem    OB History    No data available       Home Medications    Prior to Admission medications   Medication Sig Start Date End Date Taking? Authorizing Provider  acetaminophen (TYLENOL) 325 MG tablet Take 325-650 mg by mouth daily as needed (for headaches).    Yes [provider]  albuterol (PROVENTIL HFA;VENTOLIN HFA) 108 (90 Base) MCG/ACT inhaler Inhale 2 puffs into the lungs every 6 (six) hours as needed for wheezing or shortness of breath.   Yes [provider]  amLODipine (NORVASC) 2.5 MG tablet Take one tablet twice daily for blood pressure Patient taking differently: Take 2.5 mg by mouth daily. For blood pressure 11/15/16  Yes Reed, [name] L, DO  apixaban (ELIQUIS) 5 MG TABS tablet Take 1 tablet (5 mg total) by mouth 2 (two) times daily. 09/23/16  Yes Sherran Needs, NP  atenolol (TENORMIN) 50 MG tablet TAKE 1 TABLET DAILY TO HELPBLOOD PRESSURE Patient taking differently: Take 25 mg by mouth two times a day for blood pressure 11/08/16  Yes Reed, [name] L, DO  atorvastatin (LIPITOR) 40 MG tablet Take 1 tablet (40 mg total) by mouth daily at 6 PM. 03/04/17  Yes Reed, [name] L, DO  calcium carbonate (TUMS EX) 750 MG chewable tablet Chew 1 tablet by mouth daily as needed for heartburn.    Yes [provider]  citalopram (CELEXA) 10 MG tablet TAKE 1/2 TABLET DAILY FOR  DEPRESSION Patient taking differently: Take 5 mg by mouth once a day for depression 08/27/16  Yes Estill Dooms, MD    Dulaglutide (TRULICITY) 1.5 BH/4.1PF SOPN Inject 1.5 mg into the skin once a week. Patient taking differently: Inject 1.5 mg into the skin every Monday.  06/09/17 09/07/17 Yes Tivis Ringer, RPH-CPP  gabapentin (NEURONTIN) 300 MG capsule Take 1 capsule (300 mg total) by mouth 2 (two) times daily. 03/20/17  Yes Reed, [name] L, DO  meclizine (ANTIVERT) 25 MG tablet Take 1 tablet (25 mg total) by mouth daily as needed for dizziness or nausea. 11/07/16  Yes Reed, [name] L, DO  Vitamin D, Ergocalciferol, (DRISDOL) 50000 units CAPS capsule TAKE 1 CAPSULE WEEKLY FOR  VITAMIN D Patient taking differently: Take 50,000 units by mouth once a week on Thursdays 03/31/17  Yes Reed, [name] L, DO    Family History Family History  Problem Relation Age of Onset  . Cancer Mother        pancreatic  . Cancer Father        bladder  .  Heart attack Father   . Hypertension Sister   . Diabetes Sister   . Diabetes Brother   . Kidney cancer Brother   . Hypertension Brother   . Diabetes Brother   . Heart disease Brother 15       Myocardial Infarction     Social History Social History   Tobacco Use  . Smoking status: Former Smoker    Packs/day: 0.25    Years: 62.00    Pack years: 15.50    Last attempt to quit: 04/02/2011    Years since quitting: 6.2  . Smokeless tobacco: Never Used  . Tobacco comment: stopped consistently smoking in 2012 but still smokes occasionally (one pack per week)  Substance Use Topics  . Alcohol use: No  . Drug use: No     Allergies   Metformin and related; Ceclor [cefaclor]; Codeine; Lopid [gemfibrozil]; Metoprolol; Morphine and related; Niacin and related; Relafen [nabumetone]; Septra [sulfamethoxazole-trimethoprim]; Tetracycline; Toprol xl [metoprolol tartrate]; and Adhesive [tape]   Review of Systems Review of Systems  Constitutional: Negative for chills and fever.  HENT: Negative for congestion, ear discharge, ear pain, rhinorrhea, sinus pressure and sore throat.    Eyes: Negative for photophobia and visual disturbance.  Respiratory: Positive for shortness of breath. Negative for cough and chest tightness.   Cardiovascular: Negative for chest pain, palpitations and leg swelling.  Gastrointestinal: Positive for nausea. Negative for abdominal pain, diarrhea and vomiting.  Genitourinary: Negative for dysuria and frequency.  Musculoskeletal: Negative for arthralgias, back pain, myalgias and neck stiffness.  Skin: Negative for color change, pallor and rash.  Neurological: Positive for dizziness, weakness (Generalized) and headaches. Negative for facial asymmetry, light-headedness and numbness.     Physical Exam Updated Vital Signs BP (!) 166/65   Pulse (!) 58   Resp 16   SpO2 93%   Physical Exam  Constitutional: She is oriented to person, place, and time. She appears well-developed and well-nourished. No distress.  HENT:  Head: Normocephalic and atraumatic.  Mouth/Throat: Oropharynx is clear and moist.  Left TM clear, right ear with cerumen impaction, TM not visible  Eyes: EOM are normal. Pupils are equal, round, and reactive to light. Right eye exhibits no discharge. Left eye exhibits no discharge.  No nystagmus  Neck: Normal range of motion. Neck supple.  No rigidity  Cardiovascular: Normal rate, regular rhythm, normal heart sounds and intact distal pulses.  Pulmonary/Chest: Effort normal and breath sounds normal. No stridor. No respiratory distress. She has no wheezes. She has no rales.  Respirations equal and unlabored, patient able to speak in full sentences, lungs clear to auscultation bilaterally  Abdominal: Soft. Bowel sounds are normal. She exhibits no distension and no mass. There is no tenderness. There is no guarding.  Musculoskeletal: She exhibits no edema or deformity.  Neurological: She is alert and oriented to person, place, and time. Coordination normal.  Speech is clear, able to follow commands CN III-XII intact Normal strength  in upper and lower extremities bilaterally including dorsiflexion and plantar flexion, strong and equal grip strength Sensation normal to light and sharp touch Moves extremities without ataxia, coordination intact Normal finger to nose and rapid alternating movements No pronator drift  Skin: Skin is warm and dry. Capillary refill takes less than 2 seconds. She is not diaphoretic.  Psychiatric: She has a normal mood and affect. Her behavior is normal.  Nursing note and vitals reviewed.    ED Treatments / Results  Labs (all labs ordered are listed, but only abnormal  results are displayed) Labs Reviewed  BASIC METABOLIC PANEL - Abnormal; Notable for the following components:      Result Value   CO2 20 (*)    Creatinine, Ser 1.09 (*)    Calcium 8.4 (*)    GFR calc non Af Amer 46 (*)    GFR calc Af Amer 53 (*)    All other components within normal limits  CBC - Abnormal; Notable for the following components:   RBC 3.20 (*)    Hemoglobin 10.2 (*)    HCT 30.9 (*)    All other components within normal limits  URINALYSIS, ROUTINE W REFLEX MICROSCOPIC - Abnormal; Notable for the following components:   Color, Urine STRAW (*)    Hgb urine dipstick SMALL (*)    Leukocytes, UA SMALL (*)    Squamous Epithelial / LPF 0-5 (*)    All other components within normal limits  CBG MONITORING, ED  I-STAT TROPONIN, ED    EKG  EKG Interpretation  Date/Time:  Sunday June 15 2017 16:33:40 EDT Ventricular Rate:  56 PR Interval:    QRS Duration: 87 QT Interval:  474 QTC Calculation: 458 R Axis:   55 Text Interpretation:  Sinus rhythm Prolonged PR interval Low voltage, precordial leads Nonspecific T abnormalities, lateral leads No significant change since last tracing Confirmed by Virgel Manifold [phone]) on 06/15/2017 4:49:56 PM       Radiology Dg Chest 2 View  Result Date: 06/15/2017 CLINICAL DATA:  Wheezing and shortness of breath EXAM: CHEST - 2 VIEW COMPARISON:  July 29, 2016 FINDINGS:  Lungs are clear. Heart is upper normal in size with pulmonary vascularity within normal limits. No adenopathy. There is aortic atherosclerosis. A loop recorder is present on the left anteriorly. No evident bone lesions. IMPRESSION: Aortic atherosclerosis.  No edema or consolidation. Aortic Atherosclerosis (ICD10-I70.0). Electronically Signed   By: Lowella Grip III M.D.   On: 06/15/2017 17:57   Ct Head Wo Contrast  Result Date: 06/15/2017 CLINICAL DATA:  Dizziness and nausea EXAM: CT HEAD WITHOUT CONTRAST TECHNIQUE: Contiguous axial images were obtained from the base of the skull through the vertex without intravenous contrast. COMPARISON:  July 29, 2016 FINDINGS: Brain: Mild diffuse atrophy is stable. There is no intracranial mass, hemorrhage, extra-axial fluid collection, or midline shift. There is evidence of a prior infarct along the superior, lateral aspect of the left cerebellum, stable. There is patchy small vessel disease in the centra semiovale bilaterally. There is no new gray-white compartment lesion. No acute infarct demonstrable. Vascular: There is no hyperdense vessel. There is calcification in each carotid siphon. Skull: Bony calvarium appears intact. Sinuses/Orbits: There is a focal osteoma in the right frontal sinus. There is slight mucosal thickening in several ethmoid air cells. There is mild mucosal thickening in the posterior right sphenoid sinus. Other paranasal sinuses are clear. Orbits appear symmetric bilaterally with evidence of prior cataract removals bilaterally. Other: Mastoid air cells are clear. IMPRESSION: Stable atrophy with periventricular small vessel disease. Prior infarct along the superolateral aspect of the left cerebellum. No acute infarct. No mass or hemorrhage. There are foci of arterial vascular calcification. There are areas of paranasal sinus disease. Small benign osteoma right frontal sinus. Status post bilateral cataract removal. Electronically Signed   By:  Lowella Grip III M.D.   On: 06/15/2017 18:02   Mr Brain Wo Contrast  Result Date: 06/15/2017 CLINICAL DATA:  84 year old female with dizziness. Persistent vertigo. Nausea. EXAM: MRI HEAD WITHOUT CONTRAST TECHNIQUE: Multiplanar, multiecho pulse  sequences of the brain and surrounding structures were obtained without intravenous contrast. COMPARISON:  Head CT without contrast 1742 hours today. Brain MRI 07/28/2016. FINDINGS: Brain: Chronic bilateral cerebellar infarcts. No restricted diffusion or evidence of acute infarction. The brainstem remains normal. Chronic Patchy and confluent bilateral cerebral white matter T2 and FLAIR hyperintensity, stable since 2018. No chronic cerebral blood products. No midline shift, mass effect, evidence of mass lesion, ventriculomegaly, extra-axial collection or acute intracranial hemorrhage. Cervicomedullary junction and pituitary are within normal limits. Vascular: Major intracranial vascular flow voids are stable. Skull and upper cervical spine: Chronic cervical spine degeneration including disc space loss and multilevel mild spondylolisthesis. Normal bone marrow signal. Sinuses/Orbits: Stable and negative orbits soft tissues. Regressed fluid and mucosal thickening in the right sphenoid sinus. Other: Mastoid air cells remain clear. Negative scalp and face soft tissues. IMPRESSION: 1.  No acute intracranial abnormality. 2. Chronic bilateral cerebellar ischemia. Electronically Signed   By: Genevie Ann M.D.   On: 06/15/2017 20:05    Procedures .Ear Cerumen Removal Date/Time: 06/15/2017 9:17 PM Performed by: Jacqlyn Larsen, PA-C Authorized by: Jacqlyn Larsen, PA-C   Consent:    Consent obtained:  Verbal   Consent given by:  Patient   Risks discussed:  Bleeding, infection, dizziness, pain and TM perforation   Alternatives discussed:  No treatment Procedure details:    Location:  R ear   Procedure type: curette   Post-procedure details:    Inspection:  TM intact    Hearing quality:  Improved   Patient tolerance of procedure:  Tolerated well, no immediate complications   (including critical care time)  Medications Ordered in ED Medications - No data to display   Initial Impression / Assessment and Plan / ED Course  I have reviewed the triage vital signs and the nursing notes.  Pertinent labs & imaging results that were available during my care of the patient were reviewed by me and considered in my medical decision making (see chart for details).  Patient presents to the ED with acute onset of dizziness and nausea upon waking this morning, history of previous posterior circulation stroke with similar presentation, on initial exam patient is hypertensive but vitals otherwise normal, neurologic exam reassuring, no deficits noted.  Patient does have a right-sided cerumen impaction which could be causing the patient's symptoms but given her history, must rule out central etiology for patient's vertigo.  Patient also complaining of some progressive exertional dyspnea, no acute respiratory distress noted on exam the lungs are clear, unlikely PE given this is been going on for several months, will get chest x-ray, EKG and troponin to rule out cardiac etiology.  I reviewed the labs which are overall reassuring, no leukocytosis, hemoglobin is stable, history of anemia, no acute electrolyte derangements requiring intervention, creatinine is 1.09, which is improved from patient's baseline, UA without evidence of infection.  Troponin negative and EKG without concerning ischemic changes.  Chest x-ray shows no active cardiopulmonary disease.  CT head shows evidence of previous stroke and stable atrophy, but no acute changes. Will plan to proceed with MRI, cerumen impaction removed with curette, tolerated well by pt, TM intact w/o evidence of infection.  MRI of the brain shows no evidence of acute stroke or other intracranial abnormalities, shows evidence of prior stable  cerebellar stroke.  Discussed the results with patient and family, very reassured that her vertigo is of peripheral etiology.  Patient reports she is feeling much better.  At this time she is stable  for discharge home with close follow-up with her primary doctor and her neurologist.  Return for problems discussed.  Patient and family expressed understanding and are in agreement with plan.  Pt's blood pressure was elevated today,  pt is not exhibiting any symptoms to suggest hypertensive urgency or emergency today considering reassuring workup, will have pt follow up with their PCP in 1 week for blood pressure check.   Final Clinical Impressions(s) / ED Diagnoses   Final diagnoses:  Vertigo  Impacted cerumen of right ear  Shortness of breath    ED Discharge Orders        Ordered    meclizine (ANTIVERT) 25 MG tablet  3 times daily PRN     06/15/17 2120       Jacqlyn Larsen, PA-C 06/16/17 1301    Virgel Manifold, MD 06/16/17 1507

## 2017-06-18 ENCOUNTER — Telehealth: Payer: Self-pay | Admitting: *Deleted

## 2017-06-18 NOTE — Telephone Encounter (Signed)
Spoke with patient to assist her in sending a manual transmission.  5 brady episodes reviewed--all nocturnal and asymptomatic.  Patient reports she sleeps from ~11pm-9am (though she is in bed much earlier in the evening watching television).  ECGs printed and placed in Dr. Macon Large folder for review.  Patient appreciative of call and denies additional questions or concerns at this time.

## 2017-06-18 NOTE — Telephone Encounter (Signed)
Attempted to reach patient to request manual [facility] transmission for review of 5 "brady" episodes.  Available ECG appropriate, occurred on 06/15/17 at 2358, duration 10sec, rate ~30bpm.  No answer on home phone, unable to LM.

## 2017-07-02 ENCOUNTER — Ambulatory Visit (INDEPENDENT_AMBULATORY_CARE_PROVIDER_SITE_OTHER): Payer: Medicare Other | Admitting: *Deleted

## 2017-07-02 DIAGNOSIS — I639 Cerebral infarction, unspecified: Secondary | ICD-10-CM

## 2017-07-03 NOTE — Progress Notes (Signed)
Carelink Summary Report / Loop Recorder 

## 2017-07-04 ENCOUNTER — Other Ambulatory Visit: Payer: Self-pay | Admitting: *Deleted

## 2017-07-04 MED ORDER — ACCU-CHEK AVIVA PLUS W/DEVICE KIT: PACK | 0 refills | Status: DC

## 2017-07-04 MED ORDER — GLUCOSE BLOOD VI STRP: ORAL_STRIP | 11 refills | Status: DC

## 2017-07-04 MED ORDER — ACCU-CHEK MULTICLIX LANCET DEV KIT: PACK | 0 refills | Status: DC

## 2017-07-04 MED ORDER — ACCU-CHEK MULTICLIX LANCETS MISC: 11 refills | Status: DC

## 2017-07-04 NOTE — Telephone Encounter (Signed)
Patient called requesting new meter to check her blood sugar. Stated that she spoke with someone at her last OV but never received. Faxed to pharmacy.

## 2017-07-07 LAB — CUP PACEART REMOTE DEVICE CHECK
Implantable Pulse Generator Implant Date: 20180502
MDC IDC SESS DTM: 20190302010526

## 2017-07-17 ENCOUNTER — Other Ambulatory Visit: Payer: Self-pay

## 2017-07-17 ENCOUNTER — Other Ambulatory Visit: Payer: Medicare Other

## 2017-07-17 DIAGNOSIS — I10 Essential (primary) hypertension: Secondary | ICD-10-CM | POA: Diagnosis not present

## 2017-07-17 DIAGNOSIS — E1129 Type 2 diabetes mellitus with other diabetic kidney complication: Secondary | ICD-10-CM

## 2017-07-17 DIAGNOSIS — N182 Chronic kidney disease, stage 2 (mild): Secondary | ICD-10-CM | POA: Diagnosis not present

## 2017-07-18 LAB — CBC WITH DIFFERENTIAL/PLATELET
Basophils Absolute: 61 cells/uL (ref 0–200)
Basophils Relative: 0.7 %
Eosinophils Absolute: 157 cells/uL (ref 15–500)
Eosinophils Relative: 1.8 %
HCT: 38.2 % (ref 35.0–45.0)
Hemoglobin: 12.9 g/dL (ref 11.7–15.5)
Lymphs Abs: 1331 cells/uL (ref 850–3900)
MCH: 31.1 pg (ref 27.0–33.0)
MCHC: 33.8 g/dL (ref 32.0–36.0)
MCV: 92 fL (ref 80.0–100.0)
MPV: 12.8 fL — ABNORMAL HIGH (ref 7.5–12.5)
Monocytes Relative: 9.7 %
Neutro Abs: 6308 cells/uL (ref 1500–7800)
Neutrophils Relative %: 72.5 %
Platelets: 178 10*3/uL (ref 140–400)
RBC: 4.15 10*6/uL (ref 3.80–5.10)
RDW: 12.7 % (ref 11.0–15.0)
Total Lymphocyte: 15.3 %
WBC mixed population: 844 cells/uL (ref 200–950)
WBC: 8.7 10*3/uL (ref 3.8–10.8)

## 2017-07-18 LAB — BASIC METABOLIC PANEL
BUN/Creatinine Ratio: 15 (calc) (ref 6–22)
BUN: 24 mg/dL (ref 7–25)
CO2: 27 mmol/L (ref 20–32)
Calcium: 9.6 mg/dL (ref 8.6–10.4)
Chloride: 104 mmol/L (ref 98–110)
Creat: 1.59 mg/dL — ABNORMAL HIGH (ref 0.60–0.88)
Glucose, Bld: 115 mg/dL — ABNORMAL HIGH (ref 65–99)
Potassium: 4.6 mmol/L (ref 3.5–5.3)
Sodium: 137 mmol/L (ref 135–146)

## 2017-07-18 LAB — HEMOGLOBIN A1C
Hgb A1c MFr Bld: 6.4 % of total Hgb — ABNORMAL HIGH (ref <5.7)
Mean Plasma Glucose: 137 (calc)
eAG (mmol/L): 7.6 (calc)

## 2017-07-21 ENCOUNTER — Encounter: Payer: Self-pay | Admitting: Internal Medicine

## 2017-07-21 ENCOUNTER — Ambulatory Visit (INDEPENDENT_AMBULATORY_CARE_PROVIDER_SITE_OTHER): Payer: Medicare Other | Admitting: Internal Medicine

## 2017-07-21 ENCOUNTER — Telehealth: Payer: Self-pay | Admitting: *Deleted

## 2017-07-21 ENCOUNTER — Ambulatory Visit (INDEPENDENT_AMBULATORY_CARE_PROVIDER_SITE_OTHER): Payer: Medicare Other

## 2017-07-21 VITALS — BP 138/72 | HR 66 | Temp 97.8°F | Ht 66.0 in | Wt 168.0 lb

## 2017-07-21 DIAGNOSIS — M48062 Spinal stenosis, lumbar region with neurogenic claudication: Secondary | ICD-10-CM | POA: Diagnosis not present

## 2017-07-21 DIAGNOSIS — H814 Vertigo of central origin: Secondary | ICD-10-CM | POA: Insufficient documentation

## 2017-07-21 DIAGNOSIS — F325 Major depressive disorder, single episode, in full remission: Secondary | ICD-10-CM | POA: Diagnosis not present

## 2017-07-21 DIAGNOSIS — H6501 Acute serous otitis media, right ear: Secondary | ICD-10-CM | POA: Diagnosis not present

## 2017-07-21 DIAGNOSIS — E1129 Type 2 diabetes mellitus with other diabetic kidney complication: Secondary | ICD-10-CM

## 2017-07-21 DIAGNOSIS — I639 Cerebral infarction, unspecified: Secondary | ICD-10-CM

## 2017-07-21 DIAGNOSIS — N182 Chronic kidney disease, stage 2 (mild): Secondary | ICD-10-CM | POA: Diagnosis not present

## 2017-07-21 DIAGNOSIS — H8149 Vertigo of central origin, unspecified ear: Secondary | ICD-10-CM | POA: Diagnosis not present

## 2017-07-21 DIAGNOSIS — Z0000 Encounter for general adult medical examination without abnormal findings: Secondary | ICD-10-CM

## 2017-07-21 DIAGNOSIS — I10 Essential (primary) hypertension: Secondary | ICD-10-CM | POA: Diagnosis not present

## 2017-07-21 MED ORDER — DOXYCYCLINE HYCLATE 100 MG PO TABS: 100.0000 mg | ORAL_TABLET | Freq: Two times a day (BID) | ORAL | 0 refills | Status: DC

## 2017-07-21 MED ORDER — EMPAGLIFLOZIN 10 MG PO TABS: 10.0000 mg | ORAL_TABLET | Freq: Every day | ORAL | 3 refills | Status: DC

## 2017-07-21 MED ORDER — GABAPENTIN 300 MG PO CAPS: 300.0000 mg | ORAL_CAPSULE | Freq: Every day | ORAL | 0 refills | Status: DC

## 2017-07-21 MED ORDER — PIOGLITAZONE HCL 15 MG PO TABS: 15.0000 mg | ORAL_TABLET | Freq: Every day | ORAL | 3 refills | Status: DC

## 2017-07-21 MED ORDER — ZOSTER VAC RECOMB ADJUVANTED 50 MCG/0.5ML IM SUSR: 0.5000 mL | Freq: Once | INTRAMUSCULAR | 1 refills | Status: AC

## 2017-07-21 NOTE — Progress Notes (Signed)
Location:  [facility] clinic Provider:  Carizma Dunsworth L. Mariea Clonts, D.O., C.M.D.  Code Status: FULL CODE Goals of Care:  Advanced Directives 07/21/2017  Does Patient Have a Medical Advance Directive? No  Type of Advance Directive -  Does patient want to make changes to medical advance directive? -  Copy of [place] in Chart? -  Would patient like information on creating a medical advance directive? Yes (MAU/Ambulatory/Procedural Areas - Information given)    Chief Complaint  Patient presents with  . Medical Management of Chronic Issues    [facility] follow-up  . ACP    none    HPI: Patient is a 84y.o. female seen today for medical management of chronic diseases.    Says the trulicity gives her heartburn and affects her from Monday to Wednesday.  Feels like it affects her haital hernia.  Last heartburn had been when she was pregnant with he son.  Can't lie on her right side at night.  So bad it burns her ears.  She's also itching.  Sugars were 90s in the mornings and 120s-130s in the evenings.    No longer having the loose bowels she had with metformin.    Right ear was giving her problems--had cerumen impaction that caused some vertigo.  She's wondering if she needs an antibiotic to clear it up.  She is still bringing up yellow mucus in the morning (unclear if due to URI or gerd).  Has tolerated doxycycline in the past despite tetracycline causing nausea and vomiting.    Her husband is concerned about her number of pills.  She is sleeping a lot.  She is less energetic.   Past Medical History:  Diagnosis Date  . Allergic rhinitis, cause unspecified   . Anxiety state, unspecified   . Diaphragmatic hernia without mention of obstruction or gangrene   . Diverticulosis of colon (without mention of hemorrhage)   . Female stress incontinence   . Insomnia, unspecified   . Interstitial cystitis    surgery 02/01/2013  . Irritable bowel syndrome   . Memory loss   . Other and  unspecified hyperlipidemia   . Other premature beats   . Polyneuropathy in diabetes(357.2)   . Postmenopausal bleeding   . Proteinuria   . [place]   . 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 Unspecified essential hypertension   . Viral warts, unspecified     Past Surgical History:  Procedure Laterality Date  . ABDOMINAL HYSTERECTOMY  1977   Dr.Hambright  . APPENDECTOMY  1965  . CATARACT EXTRACTION, BILATERAL  02/10/2006   Dr.Groat   . COLONOSCOPY  07/17/2004   polypectomy  . [place] LOOP RECORDER INSERTION N/A 07/31/2016   Procedure: Loop Recorder Insertion;  Surgeon: GEvans Lance MD;  [facility];  Service: Cardiovascular;  Laterality: N/A;  . TEE WITHOUT CARDIOVERSION N/A 07/30/2016   Procedure: TRANSESOPHAGEAL ECHOCARDIOGRAM (TEE);  Surgeon: PJosue Hector MD;  [place] [facility]  Service: Cardiovascular;  Laterality: N/A;  . [place] . TRIGGER FINGER RELEASE  05/13/16  . urinary bladder      surgery 02/01/2013 to stretch bladder stem    Allergies  Allergen Reactions  . Metformin And Related Diarrhea  . Ceclor [Cefaclor] Other (See Comments)    Unknown reaction; not recalled  . Codeine Nausea And Vomiting  .  Lopid [Gemfibrozil] Other (See Comments)    Unknown reaction; not recalled  . Metoprolol Nausea And Vomiting  . Morphine And Related Other (See Comments)    Passed out  . Niacin And Related Other (See Comments)    Unknown reaction; not recalled  . Relafen [Nabumetone] Nausea And Vomiting  . Septra [Sulfamethoxazole-Trimethoprim] Nausea And Vomiting  . Tetracycline Nausea And Vomiting  . Toprol Xl [Metoprolol Tartrate] Nausea And Vomiting  . Adhesive [Tape] Rash and Other (See Comments)    Tears the skin also    Outpatient Encounter Medications as of 07/21/2017  Medication Sig  . acetaminophen  (TYLENOL) 325 MG tablet Take 325-650 mg by mouth daily as needed (for headaches).   Marland Kitchen albuterol (PROVENTIL HFA;VENTOLIN HFA) 108 (90 Base) MCG/ACT inhaler Inhale 2 puffs into the lungs every 6 (six) hours as needed for wheezing or shortness of breath.  Marland Kitchen amLODipine (NORVASC) 2.5 MG tablet Take one tablet twice daily for blood pressure  . apixaban (ELIQUIS) 5 MG TABS tablet Take 1 tablet (5 mg total) by mouth 2 (two) times daily.  Marland Kitchen atenolol (TENORMIN) 50 MG tablet TAKE 1 TABLET DAILY TO HELPBLOOD PRESSURE  . atorvastatin (LIPITOR) 40 MG tablet Take 1 tablet (40 mg total) by mouth daily at 6 PM.  . calcium carbonate (TUMS EX) 750 MG chewable tablet Chew 1 tablet by mouth daily as needed for heartburn.   . citalopram (CELEXA) 10 MG tablet TAKE 1/2 TABLET DAILY FOR  DEPRESSION  . Dulaglutide (TRULICITY) 1.5 HY/0.7PX SOPN Inject 1.5 mg into the skin once a week.  . gabapentin (NEURONTIN) 300 MG capsule TAKE 1 CAPSULE TWICE DAILY  . glucose blood (ACCU-CHEK AVIVA PLUS) test strip Use to test blood sugar twice daily. Dx: E11.29  . Lancets (ACCU-CHEK MULTICLIX) lancets Use to test blood sugar twice daily. E11.29  . meclizine (ANTIVERT) 25 MG tablet Take 1 tablet (25 mg total) by mouth 3 (three) times daily as needed for dizziness.  . Vitamin D, Ergocalciferol, (DRISDOL) 50000 units CAPS capsule TAKE 1 CAPSULE WEEKLY FOR  VITAMIN D  . Zoster Vaccine Adjuvanted [facility]) injection Inject 0.5 mLs into the muscle once for 1 dose.  . [DISCONTINUED] Blood Glucose Monitoring Suppl (ACCU-CHEK AVIVA PLUS) w/Device KIT Use to test blood sugar twice daily. Dx: E11.29  . [DISCONTINUED] Lancets Misc. (ACCU-CHEK MULTICLIX LANCET DEV) KIT Use to test blood sugar twice daily. Dx E11.29   No facility-administered encounter medications on file as of 07/21/2017.     Review of Systems:  Review of Systems  Constitutional: Negative for chills, fever and malaise/fatigue.  HENT: Negative for congestion.   Eyes: Negative  for blurred vision.  Respiratory: Negative for cough and shortness of breath.   Cardiovascular: Negative for chest pain, palpitations and leg swelling.  Gastrointestinal: Positive for abdominal pain and heartburn. Negative for blood in stool, constipation, melena, nausea and vomiting.  Genitourinary: Positive for dysuria. Negative for flank pain, frequency, hematuria and urgency.  Musculoskeletal: Negative for joint pain.  Skin: Negative for itching and rash.  Neurological: Negative for tingling, sensory change and weakness.  Endo/Heme/Allergies: Bruises/bleeds easily.  Psychiatric/Behavioral: Negative for depression and memory loss.       Mild word finding difficulty    Health Maintenance  Topic Date Due  . FOOT EXAM  06/22/2015  . OPHTHALMOLOGY EXAM  12/25/2016  . PNA vac Low Risk Adult (2 of 2 - PPSV23) 05/07/2017  . INFLUENZA VACCINE  10/30/2017  . URINE MICROALBUMIN  11/05/2017  . HEMOGLOBIN A1C  01/16/2018  . TETANUS/TDAP  09/15/2018  . DEXA SCAN  Completed    Physical Exam: Vitals:   07/21/17 1039  BP: 138/72  Pulse: 66  Temp: 97.8 F (36.6 C)  TempSrc: Oral  SpO2: 95%  Weight: 168 lb (76.2 kg)  Height: '5\' 6"'  (1.676 m)   Body mass index is 27.12 kg/m. Physical Exam  Constitutional: She is oriented to person, place, and time. She appears well-developed and well-nourished. No distress.  HENT:  Red ear drum and some retraction  Cardiovascular: Normal rate, regular rhythm, normal heart sounds and intact distal pulses.  Pulmonary/Chest: Effort normal and breath sounds normal. No respiratory distress.  Abdominal: Bowel sounds are normal.  Musculoskeletal: Normal range of motion.  Neurological: She is alert and oriented to person, place, and time.  Skin: Skin is warm and dry.  Psychiatric: She has a normal mood and affect.    Labs reviewed: Basic Metabolic Panel: Recent Labs    07/29/16 0414  05/12/17 1435 06/15/17 1631 07/17/17 0844  NA  --    < > 139 136  137  K  --    < > 4.3 3.8 4.6  CL  --    < > 104 107 104  CO2  --    < > 29 20* 27  GLUCOSE  --    < > 101 91 115*  BUN  --    < > '16 14 24  ' CREATININE  --    < > 1.09* 1.09* 1.59*  CALCIUM  --    < > 9.7 8.4* 9.6  TSH 4.432  --   --   --   --    < > = values in this interval not displayed.   Liver Function Tests: Recent Labs    03/17/17 0920 05/12/17 1435  AST 14 11  ALT 10 10  BILITOT 0.5 0.4  PROT 6.3 6.6   Recent Labs    05/12/17 1435  LIPASE 70*  AMYLASE 33   No results for input(s): AMMONIA in the last 8760 hours. CBC: Recent Labs    03/17/17 0920 05/12/17 1435 06/15/17 1631 07/17/17 0844  WBC 8.1 9.6 7.9 8.7  NEUTROABS 5,532 6,317  --  6,308  HGB 12.9 13.2 10.2* 12.9  HCT 38.2 38.5 30.9* 38.2  MCV 93.6 91.2 96.6 92.0  PLT 168 178 166 178   Lipid Panel: Recent Labs    07/29/16 0414 11/05/16 0906 03/17/17 0920  CHOL 162 127 135  HDL 38* 43* 40*  LDLCALC 81 55 69  TRIG 217* 146 179*  CHOLHDL 4.3 3.0 3.4   Lab Results  Component Value Date   HGBA1C 6.4 (H) 07/17/2017   Assessment/Plan 1. 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 Sabra Heck and she recommends just low dose jardiance as gfr borderline and low dose actos with it for insulin resistance--will send to local pharmacy to try - Basic metabolic panel; Future - CBC with Differential/Platelet; Future - Hemoglobin A1c; Future - empagliflozin (JARDIANCE) 10 MG TABS tablet; Take 10 mg by mouth daily.  Dispense: 30 tablet; Refill: 3 - pioglitazone (ACTOS) 15 MG tablet; Take 1 tablet (15 mg total) by mouth daily.  Dispense: 30 tablet; Refill:  3  2. Benign hypertension -bp at goal, cont same regimen, no changes - Basic  metabolic panel; Future  3. Non-recurrent acute serous otitis media of right ear - with recent vertigo episode when ear was intitially full of cerumen, still having mildly  - treat with abx due to ongoing ear pain, erythema of TM, retraction - doxycycline (VIBRA-TABS) 100 MG tablet; Take 1 tablet (100 mg total) by mouth 2 (two) times daily.  Dispense: 20 tablet; Refill: 0  4. Chronic renal disease, stage II - encouraged hydration--granddaughter was along and will help ensure this happens - Basic metabolic panel; Future  5. Vertigo of central origin, unspecified laterality -due to otitis media and cerumen -cerumen resolved, tx otitis and monitor  6. Depression, major, in remission ([place]) -in remission, but has been on low dose ssri, will take every other day until used up or next visit whichever is sooner -if depression recurs, restart, but trying to decrease pill burden and potential effects on her sleepiness in daytime  7. Spinal stenosis of lumbar region with neurogenic claudication -doing great with gabapentin 320m po bid (mid am and around 8/8:30pm), but sleepy in daytime now so will see if hs dosing only does better with this -if pain recurs, she'll return to bid (could do lower dose bid also as option if needed)  Labs/tests ordered:   Orders Placed This Encounter  Procedures  . Basic metabolic panel    Standing Status:   Future    Standing Expiration Date:   03/22/2018    Order Specific Question:   Has the patient fasted?    Answer:   Yes  . CBC with Differential/Platelet    Standing Status:   Future    Standing Expiration Date:   03/22/2018  . Hemoglobin A1c    Standing Status:   Future    Standing Expiration Date:   03/22/2018    Next appt: 12/15/2017 med mgt, labs before  Lisandro Meggett L. Chaniyah Jahr, D.O. [facility] [address]  [id number] Phone (Mon-Fri 8am-5pm):  [phone] Call:  [phone]& follow  prompts after 5pm & weekends Office Phone:  [phone] Fax:  [phone]

## 2017-07-21 NOTE — Telephone Encounter (Signed)
.  left message to have patient return my call.   Patient can stop Trulicity and Trajenta and begin Jardiance 10mg  and Actos 15mg , per verbal from Dr. Mariea Clonts.

## 2017-07-21 NOTE — Patient Instructions (Signed)
Penny Miller , Thank you for taking time to come for your Medicare Wellness Visit. I appreciate your ongoing commitment to your health goals. Please review the following plan we discussed and let me know if I can assist you in the future.   Screening recommendations/referrals: Colonoscopy excluded, you are over age 82 Mammogram excluded, you are over gae 75 Bone Density up to date Recommended yearly ophthalmology/optometry visit for glaucoma screening and checkup Recommended yearly dental visit for hygiene and checkup  Vaccinations: Influenza vaccine up to date, due 2019 fall season Pneumococcal vaccine up to date, completed Tdap vaccine up to date, due 09/15/2018 Shingles vaccine due, prescription sent to pharmacy    Advanced directives: Advance directive discussed with you today. I have provided a copy for you to complete at home and have notarized. Once this is complete please bring a copy in to our office so we can scan it into your chart.  Conditions/risks identified: none  Next appointment: [name], RN 07/24/2018 @ 10am   Preventive Care 65 Years and Older, Female Preventive care refers to lifestyle choices and visits with your health care provider that can promote health and wellness. What does preventive care include?  A yearly physical exam. This is also called an annual well check.  Dental exams once or twice a year.  Routine eye exams. Ask your health care provider how often you should have your eyes checked.  Personal lifestyle choices, including:  Daily care of your teeth and gums.  Regular physical activity.  Eating a healthy diet.  Avoiding tobacco and drug use.  Limiting alcohol use.  Practicing safe sex.  Taking low-dose aspirin every day.  Taking vitamin and mineral supplements as recommended by your health care provider. What happens during an annual well check? The services and screenings done by your health care provider during your annual well  check will depend on your age, overall health, lifestyle risk factors, and family history of disease. Counseling  Your health care provider may ask you questions about your:  Alcohol use.  Tobacco use.  Drug use.  Emotional well-being.  Home and relationship well-being.  Sexual activity.  Eating habits.  History of falls.  Memory and ability to understand (cognition).  Work and work Statistician.  Reproductive health. Screening  You may have the following tests or measurements:  Height, weight, and BMI.  Blood pressure.  Lipid and cholesterol levels. These may be checked every 5 years, or more frequently if you are over 57 years old.  Skin check.  Lung cancer screening. You may have this screening every year starting at age 86 if you have a 30-pack-year history of smoking and currently smoke or have quit within the past 15 years.  Fecal occult blood test (FOBT) of the stool. You may have this test every year starting at age 19.  Flexible sigmoidoscopy or colonoscopy. You may have a sigmoidoscopy every 5 years or a colonoscopy every 10 years starting at age 32.  Hepatitis C blood test.  Hepatitis B blood test.  Sexually transmitted disease (STD) testing.  Diabetes screening. This is done by checking your blood sugar (glucose) after you have not eaten for a while (fasting). You may have this done every 1-3 years.  Bone density scan. This is done to screen for osteoporosis. You may have this done starting at age 20.  Mammogram. This may be done every 1-2 years. Talk to your health care provider about how often you should have regular mammograms. Talk with  your health care provider about your test results, treatment options, and if necessary, the need for more tests. Vaccines  Your health care provider may recommend certain vaccines, such as:  Influenza vaccine. This is recommended every year.  Tetanus, diphtheria, and acellular pertussis (Tdap, Td) vaccine. You  may need a Td booster every 10 years.  Zoster vaccine. You may need this after age 62.  Pneumococcal 13-valent conjugate (PCV13) vaccine. One dose is recommended after age 50.  Pneumococcal polysaccharide (PPSV23) vaccine. One dose is recommended after age 25. Talk to your health care provider about which screenings and vaccines you need and how often you need them. This information is not intended to replace advice given to you by your health care provider. Make sure you discuss any questions you have with your health care provider. Document Released: 04/14/2015 Document Revised: 12/06/2015 Document Reviewed: 01/17/2015 Elsevier Interactive Patient Education  2017 [place] Prevention in the Home Falls can cause injuries. They can happen to people of all ages. There are many things you can do to make your home safe and to help prevent falls. What can I do on the outside of my home?  Regularly fix the edges of walkways and driveways and fix any cracks.  Remove anything that might make you trip as you walk through a door, such as a raised step or threshold.  Trim any bushes or trees on the path to your home.  Use bright outdoor lighting.  Clear any walking paths of anything that might make someone trip, such as rocks or tools.  Regularly check to see if handrails are loose or broken. Make sure that both sides of any steps have handrails.  Any raised decks and porches should have guardrails on the edges.  Have any leaves, snow, or ice cleared regularly.  Use sand or salt on walking paths during winter.  Clean up any spills in your garage right away. This includes oil or grease spills. What can I do in the bathroom?  Use night lights.  Install grab bars by the toilet and in the tub and shower. Do not use towel bars as grab bars.  Use non-skid mats or decals in the tub or shower.  If you need to sit down in the shower, use a plastic, non-slip stool.  Keep the floor  dry. Clean up any water that spills on the floor as soon as it happens.  Remove soap buildup in the tub or shower regularly.  Attach bath mats securely with double-sided non-slip rug tape.  Do not have throw rugs and other things on the floor that can make you trip. What can I do in the bedroom?  Use night lights.  Make sure that you have a light by your bed that is easy to reach.  Do not use any sheets or blankets that are too big for your bed. They should not hang down onto the floor.  Have a firm chair that has side arms. You can use this for support while you get dressed.  Do not have throw rugs and other things on the floor that can make you trip. What can I do in the kitchen?  Clean up any spills right away.  Avoid walking on wet floors.  Keep items that you use a lot in easy-to-reach places.  If you need to reach something above you, use a strong step stool that has a grab bar.  Keep electrical cords out of the way.  Do not  use floor polish or wax that makes floors slippery. If you must use wax, use non-skid floor wax.  Do not have throw rugs and other things on the floor that can make you trip. What can I do with my stairs?  Do not leave any items on the stairs.  Make sure that there are handrails on both sides of the stairs and use them. Fix handrails that are broken or loose. Make sure that handrails are as long as the stairways.  Check any carpeting to make sure that it is firmly attached to the stairs. Fix any carpet that is loose or worn.  Avoid having throw rugs at the top or bottom of the stairs. If you do have throw rugs, attach them to the floor with carpet tape.  Make sure that you have a light switch at the top of the stairs and the bottom of the stairs. If you do not have them, ask someone to add them for you. What else can I do to help prevent falls?  Wear shoes that:  Do not have high heels.  Have rubber bottoms.  Are comfortable and fit you  well.  Are closed at the toe. Do not wear sandals.  If you use a stepladder:  Make sure that it is fully opened. Do not climb a closed stepladder.  Make sure that both sides of the stepladder are locked into place.  Ask someone to hold it for you, if possible.  Clearly mark and make sure that you can see:  Any grab bars or handrails.  First and last steps.  Where the edge of each step is.  Use tools that help you move around (mobility aids) if they are needed. These include:  Canes.  Walkers.  Scooters.  Crutches.  Turn on the lights when you go into a dark area. Replace any light bulbs as soon as they burn out.  Set up your furniture so you have a clear path. Avoid moving your furniture around.  If any of your floors are uneven, fix them.  If there are any pets around you, be aware of where they are.  Review your medicines with your doctor. Some medicines can make you feel dizzy. This can increase your chance of falling. Ask your doctor what other things that you can do to help prevent falls. This information is not intended to replace advice given to you by your health care provider. Make sure you discuss any questions you have with your health care provider. Document Released: 01/12/2009 Document Revised: 08/24/2015 Document Reviewed: 04/22/2014 Elsevier Interactive Patient Education  2017 [employer].

## 2017-07-21 NOTE — Patient Instructions (Addendum)
  We will call you back with a plan for your diabetes management.    Take the doxycycline 2 times per day for 10 days for your ear infection.  Eat yogurt daily while you're taking this.    Take celexa 1/2 tab every other day until used up.  Then stop.    Reduce gabapentin to 300mg  at bedtime only (late evening 8-8:30pm).  If you have too much pain in the daytime (in your leg from your back), restart the 10am dose.

## 2017-07-21 NOTE — Progress Notes (Signed)
Subjective:   Penny Miller is a 84 y.o. female who presents for Medicare Annual (Subsequent) preventive examination.  Last AWV-05/07/2016    Objective:     Vitals: BP 138/72 (BP Location: Right Arm, Patient Position: Sitting)   Pulse 66   Temp 97.8 F (36.6 C) (Oral)   Ht '5\' 6"'  (1.676 m)   Wt 168 lb (76.2 kg)   SpO2 95%   BMI 27.12 kg/m   Body mass index is 27.12 kg/m.  Advanced Directives 07/21/2017 06/15/2017 05/12/2017 11/07/2016 08/16/2016 08/12/2016 08/09/2016  Does Patient Have a Medical Advance Directive? No No No Yes Yes Yes No  Type of Advance Directive - - - - Catering manager -  Does patient want to make changes to medical advance directive? - - - No - Patient declined No - Patient declined - -  Copy of [place] in Chart? - - - - No - copy requested No - copy requested -  Would patient like information on creating a medical advance directive? Yes (MAU/Ambulatory/Procedural Areas - Information given) - No - Patient declined - No - Patient declined - No - Patient declined    Tobacco Social History   Tobacco Use  Smoking Status Former Smoker  . Packs/day: 0.25  . Years: 62.00  . Pack years: 15.50  . Last attempt to quit: 04/02/2011  . Years since quitting: 6.3  Smokeless Tobacco Never Used  Tobacco Comment   stopped consistently smoking in 2012 but still smokes occasionally (one pack per week)     Counseling given: Not Answered Comment: stopped consistently smoking in 2012 but still smokes occasionally (one pack per week)   Clinical Intake:  Pre-visit preparation completed: No  Pain : No/denies pain     Nutritional Risks: None Diabetes: Yes CBG done?: No Did pt. bring in CBG monitor from home?: No  How often do you need to have someone help you when you read instructions, pamphlets, or other written materials from your doctor or pharmacy?: 1 - Never What is the last grade level you completed in  school?: HS  Interpreter Needed?: No  Information entered by :: [name], RN  Past Medical History:  Diagnosis Date  . Allergic rhinitis, cause unspecified   . Anxiety state, unspecified   . Diaphragmatic hernia without mention of obstruction or gangrene   . Diverticulosis of colon (without mention of hemorrhage)   . Female stress incontinence   . Insomnia, unspecified   . Interstitial cystitis    surgery 02/01/2013  . Irritable bowel syndrome   . Memory loss   . Other and unspecified hyperlipidemia   . Other premature beats   . Polyneuropathy in diabetes(357.2)   . Postmenopausal bleeding   . Proteinuria   . Stroke ([place])   . 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 Unspecified essential hypertension   . Viral warts, unspecified    Past Surgical History:  Procedure Laterality Date  . ABDOMINAL HYSTERECTOMY  1977   Dr.Hambright  . APPENDECTOMY  1965  . CATARACT EXTRACTION, BILATERAL  02/10/2006   Dr.Groat   . COLONOSCOPY  07/17/2004   polypectomy  . [place]  . LOOP RECORDER INSERTION N/A 07/31/2016   Procedure: Loop Recorder Insertion;  Surgeon: [name], MD;  Location: [facility];  Service: Cardiovascular;  Laterality: N/A;  . TEE WITHOUT CARDIOVERSION N/A 07/30/2016  Procedure: TRANSESOPHAGEAL ECHOCARDIOGRAM (TEE);  Surgeon: [name], MD;  Location: [facility];  Service: Cardiovascular;  Laterality: N/A;  . [place]  . TRIGGER FINGER RELEASE  05/13/16  . urinary bladder      surgery 02/01/2013 to stretch bladder stem   Family History  Problem Relation Age of Onset  . Cancer Mother        pancreatic  . Cancer Father        bladder  . Heart attack Father   . Hypertension Sister   . Diabetes Sister   . Diabetes Brother   . Kidney cancer Brother   . Hypertension Brother   . Diabetes Brother   . Heart  disease Brother 5       Myocardial Infarction    Social History   Socioeconomic History  . Marital status: Widowed    Spouse name: Not on file  . Number of children: 1  . Years of education: Not on file  . Highest education level: Not on file  Occupational History  . 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 Financial resource strain: Not hard at all  . Food insecurity:    Worry: Never true    Inability: Never true  . Transportation needs:    Medical: No    Non-medical: No  Tobacco Use  . Smoking status: Former Smoker    Packs/day: 0.25    Years: 62.00    Pack years: 15.50    Last attempt to quit: 04/02/2011    Years since quitting: 6.3  . Smokeless tobacco: Never Used  . Tobacco comment: stopped consistently smoking in 2012 but still smokes occasionally (one pack per week)  Substance and Sexual Activity  . Alcohol use: No  . Drug use: No  . Sexual activity: Never  Lifestyle  . Physical activity:    Days per week: 7 days    Minutes per session: 10 min  . Stress: Not at all  Relationships  . Social connections:    Talks on phone: More than three times a week    Gets together: More than three times a week    Attends religious service: Never    Active member of club or organization: No    Attends meetings of clubs or organizations: Never    Relationship status: Widowed  Other Topics Concern  . Not on file  Social History Narrative   Ophthalmologist-Dr.Gould and Dr.Groat   Podiatrist- Dr.Tuchman   Dermatologist- Dr.Lupton   Urologist- Dr. Lawerance Bach    Lives with her son.     Outpatient Encounter Medications as of 07/21/2017  Medication Sig  . acetaminophen (TYLENOL) 325 MG tablet Take 325-650 mg by mouth daily as needed (for headaches).   Marland Kitchen albuterol (PROVENTIL HFA;VENTOLIN HFA) 108 (90 Base) MCG/ACT inhaler Inhale 2 puffs into the lungs every 6 (six) hours as needed for wheezing or shortness of breath.  Marland Kitchen amLODipine (NORVASC) 2.5 MG  tablet Take one tablet twice daily for blood pressure (Patient taking differently: Take 2.5 mg by mouth daily. For blood pressure)  . apixaban (ELIQUIS) 5 MG TABS tablet Take 1 tablet (5 mg total) by mouth 2 (two) times daily.  Marland Kitchen atenolol (TENORMIN) 50 MG tablet TAKE 1 TABLET DAILY TO HELPBLOOD PRESSURE (Patient taking differently: Take 25 mg by mouth two times a day for blood pressure)  . atorvastatin (LIPITOR) 40 MG tablet Take 1 tablet (40 mg total)  by mouth daily at 6 PM.  . Blood Glucose Monitoring Suppl (ACCU-CHEK AVIVA PLUS) w/Device KIT Use to test blood sugar twice daily. Dx: E11.29  . calcium carbonate (TUMS EX) 750 MG chewable tablet Chew 1 tablet by mouth daily as needed for heartburn.   . citalopram (CELEXA) 10 MG tablet TAKE 1/2 TABLET DAILY FOR  DEPRESSION (Patient taking differently: Take 5 mg by mouth once a day for depression)  . Dulaglutide (TRULICITY) 1.5 TW/6.5KC SOPN Inject 1.5 mg into the skin once a week. (Patient taking differently: Inject 1.5 mg into the skin every Monday. )  . gabapentin (NEURONTIN) 300 MG capsule TAKE 1 CAPSULE TWICE DAILY  . glucose blood (ACCU-CHEK AVIVA PLUS) test strip Use to test blood sugar twice daily. Dx: E11.29  . Lancets (ACCU-CHEK MULTICLIX) lancets Use to test blood sugar twice daily. E11.29  . Lancets Misc. (ACCU-CHEK MULTICLIX LANCET DEV) KIT Use to test blood sugar twice daily. Dx E11.29  . meclizine (ANTIVERT) 25 MG tablet Take 1 tablet (25 mg total) by mouth 3 (three) times daily as needed for dizziness.  . Vitamin D, Ergocalciferol, (DRISDOL) 50000 units CAPS capsule TAKE 1 CAPSULE WEEKLY FOR  VITAMIN D (Patient taking differently: Take 50,000 units by mouth once a week on Thursdays)  . Zoster Vaccine Adjuvanted [facility]) injection Inject 0.5 mLs into the muscle once for 1 dose.  . [DISCONTINUED] Zoster Vaccine Adjuvanted [facility]) injection Inject 0.5 mLs into the muscle once.  . [DISCONTINUED] ELIQUIS 5 MG TABS tablet TAKE 1 TABLET  TWICE A DAY   No facility-administered encounter medications on file as of 07/21/2017.     Activities of Daily Living In your present state of health, do you have any difficulty performing the following activities: 07/21/2017 07/29/2016  Hearing? Y Y  Comment - hard of hearing  Vision? N N  Difficulty concentrating or making decisions? Y N  Walking or climbing stairs? N Y  Dressing or bathing? N N  Doing errands, shopping? N Y  Conservation officer, nature and eating ? N -  Using the Toilet? N -  In the past six months, have you accidently leaked urine? Y -  Do you have problems with loss of bowel control? Y -  Managing your Medications? Y -  Managing your Finances? N -  Housekeeping or managing your Housekeeping? N -  Some recent data might be hidden    Patient Care Team: [name], DO as PCP - General (Geriatric Medicine) Ulice Bold, MD as Referring Physician (Dermatology) Love, Alyson Locket, MD as Consulting Physician (Neurology) Sharyne Peach, MD as Consulting Physician (Ophthalmology) Gean Birchwood, DPM as Consulting Physician (Podiatry) Ronald Lobo, MD as Consulting Physician (Gastroenterology) Lomax, Marny Lowenstein, MD (Inactive) as Consulting Physician (Gynecology) Druscilla Brownie, MD as Consulting Physician (Dermatology) Clent Jacks, MD as Consulting Physician (Ophthalmology) Myrlene Broker, MD as Attending Physician (Urology) Minus Breeding, MD as Consulting Physician (Cardiology)    Assessment:   This is a routine wellness examination for Saki.  Exercise Activities and Dietary recommendations Current Exercise Habits: Home exercise routine, Type of exercise: strength training/weights, Time (Minutes): 10, Frequency (Times/Week): 7, Weekly Exercise (Minutes/Week): 70, Intensity: Mild, Exercise limited by: None identified  Goals    . Weight (lb) < 165 lb (74.8 kg)     Starting 05/07/2016, I will attempt to decrease my weight to reach my goal weight of 165 lbs.         Fall Risk Fall Risk  07/21/2017 05/12/2017 03/28/2017 03/20/2017 11/07/2016  Falls in the past year? Yes No Yes No No  Number falls in past yr: 1 - 1 - -  Comment - - - - -  Injury with Fall? No - Yes - -  Comment - - bruised rib - -  Risk for fall due to : - - (No Data) - -  Risk for fall due to: Comment - - stumbled, foot stuck to carpet - -  Follow up - - - - -   Is the patient's home free of loose throw rugs in walkways, pet beds, electrical cords, etc?   yes      Grab bars in the bathroom? yes      Handrails on the stairs?   yes      Adequate lighting?   yes  Timed Get Up and Go performed: 22 seconds, fall risk  Depression Screen PHQ 2/9 Scores 07/21/2017 05/12/2017 03/20/2017 11/07/2016  PHQ - 2 Score 0 0 0 0     Cognitive Function MMSE - Mini Mental State Exam 07/21/2017 05/07/2016 12/21/2013  Orientation to time '5 5 5  ' Orientation to Place '5 4 4  ' Registration '3 3 3  ' Attention/ Calculation '5 5 5  ' Recall '3 3 2  ' Language- name 2 objects '2 2 2  ' Language- repeat '1 1 1  ' Language- follow 3 step command '3 3 3  ' Language- read & follow direction '1 1 1  ' Write a sentence '1 1 1  ' Copy design '1 1 1  ' Total score '30 29 28        ' Immunization History  Administered Date(s) Administered  . Influenza, High Dose Seasonal PF 12/31/2016  . Influenza,inj,Quad PF,6+ Mos 12/15/2012, 12/21/2013, 02/28/2015, 02/02/2016  . Influenza-Unspecified 01/22/2012  . Pneumococcal Conjugate-13 05/07/2016  . Pneumococcal-Unspecified 01/24/1995  . Td 09/14/2008    Qualifies for Shingles Vaccine? Yes, educated and prescription sent to pharmacy  Screening Tests Health Maintenance  Topic Date Due  . FOOT EXAM  06/22/2015  . OPHTHALMOLOGY EXAM  12/25/2016  . PNA vac Low Risk Adult (2 of 2 - PPSV23) 05/07/2017  . INFLUENZA VACCINE  10/30/2017  . URINE MICROALBUMIN  11/05/2017  . HEMOGLOBIN A1C  01/16/2018  . TETANUS/TDAP  09/15/2018  . DEXA SCAN  Completed    Cancer Screenings: Lung: Low  Dose CT Chest recommended if Age 74-80 years, 30 pack-year currently smoking OR have quit w/in 15years. Patient does qualify. Breast:  Up to date on Mammogram? Yes   Up to date of Bone Density/Dexa? Yes Colorectal: up to date  Additional Screenings:  Hepatitis C Screening: declined Patient is overdue for eye exam and stated she will make next appointment     Plan:    I have personally reviewed and addressed the Medicare Annual Wellness questionnaire and have noted the following in the patient's chart:  A. Medical and social history B. Use of alcohol, tobacco or illicit drugs  C. Current medications and supplements D. Functional ability and status E.  Nutritional status F.  Physical activity G. Advance directives H. List of other physicians I.  Hospitalizations, surgeries, and ER visits in previous 12 months J.  [place] to include hearing, vision, cognitive, depression L. Referrals and appointments - none  In addition, I have reviewed and discussed with patient certain preventive protocols, quality metrics, and best practice recommendations. A written personalized care plan for preventive services as well as general preventive health recommendations were provided to patient.  See attached scanned questionnaire for additional information.  Signed,   [name], RN Nurse Health Advisor  Patient concerns: Overactive bowels, exhaustion, occasional low heart rate, yellow thick productive cough in the mornings

## 2017-07-21 NOTE — Telephone Encounter (Signed)
Spoke with patient and advised results   

## 2017-07-28 ENCOUNTER — Other Ambulatory Visit: Payer: Medicare Other

## 2017-08-03 ENCOUNTER — Other Ambulatory Visit: Payer: Self-pay | Admitting: Internal Medicine

## 2017-08-04 ENCOUNTER — Telehealth: Payer: Self-pay | Admitting: *Deleted

## 2017-08-04 ENCOUNTER — Ambulatory Visit (INDEPENDENT_AMBULATORY_CARE_PROVIDER_SITE_OTHER): Payer: Medicare Other | Admitting: *Deleted

## 2017-08-04 ENCOUNTER — Other Ambulatory Visit: Payer: Self-pay | Admitting: *Deleted

## 2017-08-04 DIAGNOSIS — I639 Cerebral infarction, unspecified: Secondary | ICD-10-CM | POA: Diagnosis not present

## 2017-08-04 MED ORDER — AMLODIPINE BESYLATE 2.5 MG PO TABS: ORAL_TABLET | ORAL | 3 refills | Status: DC

## 2017-08-04 NOTE — Telephone Encounter (Signed)
Ok to fill? Per last OV

## 2017-08-04 NOTE — Telephone Encounter (Signed)
[facility]

## 2017-08-04 NOTE — Telephone Encounter (Signed)
Patient called and left message on Clinical Intake stating that she had some concerns with the new diabetic medications Dr. Mariea Clonts switched her to and wanted to discuss it with her CMA.   I tried calling patient back and she picks up and says Hello three times then hangs up. Tried twice and she picked up/hangs up and then the third time it rang busy.

## 2017-08-04 NOTE — Telephone Encounter (Signed)
No, was tapered off.

## 2017-08-05 NOTE — Telephone Encounter (Signed)
Spoke with patient and she's having problems with the Jardiance and Actos that was prescribed. Pt states she has tried reducing the dose to just one tab daily and that's not working, it's making her sugar bottom out to like the 60's. Pt also states this is making her sick on her stomach. Pt stopped taking the meds until she hears back from Dr. Mariea Clonts.

## 2017-08-05 NOTE — Progress Notes (Signed)
Carelink Summary Report / Loop Recorder 

## 2017-08-06 LAB — CUP PACEART REMOTE DEVICE CHECK
Date Time Interrogation Session: 20190404020933
Implantable Pulse Generator Implant Date: 20180502

## 2017-08-06 NOTE — Telephone Encounter (Signed)
Let's have her stop the actos, and continue with just jardiance and see how that goes.  She should try it for two full days.   If she's still having lows, she should call back Friday morning.  She should be eating three balanced meals per day.

## 2017-08-07 ENCOUNTER — Other Ambulatory Visit: Payer: Self-pay | Admitting: Internal Medicine

## 2017-08-07 NOTE — Telephone Encounter (Signed)
Spoke with patient and advised results  Patient will call back on Monday

## 2017-08-11 MED ORDER — GLUCOSE BLOOD VI STRP: ORAL_STRIP | 11 refills | Status: DC

## 2017-08-11 MED ORDER — EMPAGLIFLOZIN 25 MG PO TABS: 25.0000 mg | ORAL_TABLET | Freq: Every day | ORAL | 3 refills | Status: DC

## 2017-08-11 NOTE — Telephone Encounter (Signed)
Ok to increase her jardiance to 25mg  po daily instead of 10mg . I have no idea why the pharmacy "rejected her atenolol".  I don't see where we've ordered it anytime lately.  Looks like the last new order for it was 8/18.  It's ok to refill it.   Please send in the prescriptions to her pharmacy of choice. Thanks.

## 2017-08-11 NOTE — Addendum Note (Signed)
Addended by: Rafael Bihari A on: 08/11/2017 11:25 AM   Modules accepted: Orders

## 2017-08-11 NOTE — Addendum Note (Signed)
Addended by: Rafael Bihari A on: 08/11/2017 03:10 PM   Modules accepted: Orders

## 2017-08-11 NOTE — Telephone Encounter (Signed)
Patient notified and agreed. Medication list updated and Rx faxed. Patient does not need the Atenolol yet will call back to order.

## 2017-08-11 NOTE — Telephone Encounter (Addendum)
Patient stated that the medication, Vania Rea is not making her sick but it is not keeping BS down. Patient is wondering if she could take the Jardiance twice daily. Patient stated she feels pretty good. Please Advise.   08/11/2017 am 167 08/10/2017 pm 154 08/09/2017 am 144 08/09/2017 pm 193 08/09/2017 pm 141 08/08/2017 am 89 08/08/2017 am 141 08/07/2017  am  156 08/07/2017 pm  110   Patient stated that way back Dr. Nyoka Cowden had her on BP medications. Stated that she stays calm until something bad happens. So he gave her Atenolol for precaution measures. Patient stated that pharmacy rejected refill. Patient stated that is scares her to come off of it due to BP sometimes going up when she gets scared/nervous. Stated she takes 1/2 in the am and 1/2 in the evening. Patient wants to know if she should continue this. Her preference is to continue the medication. Please Advise.

## 2017-08-15 ENCOUNTER — Ambulatory Visit (INDEPENDENT_AMBULATORY_CARE_PROVIDER_SITE_OTHER): Payer: Medicare Other | Admitting: Podiatry

## 2017-08-15 DIAGNOSIS — B351 Tinea unguium: Secondary | ICD-10-CM | POA: Diagnosis not present

## 2017-08-15 DIAGNOSIS — M79675 Pain in left toe(s): Secondary | ICD-10-CM | POA: Diagnosis not present

## 2017-08-15 DIAGNOSIS — E1142 Type 2 diabetes mellitus with diabetic polyneuropathy: Secondary | ICD-10-CM

## 2017-08-15 DIAGNOSIS — M79674 Pain in right toe(s): Secondary | ICD-10-CM

## 2017-08-17 NOTE — Progress Notes (Signed)
Subjective:   Patient ID: Penny Miller, female   DOB: 84 y.o.   MRN: [id number]   HPI Patient presents with nail disease with thickness and inability to cut 1-5 both feet with pain   ROS      Objective:  Physical Exam  Neurovascular status intact with thick yellow brittle nailbeds 1-5 both feet with pain     Assessment:  Mycotic nail infection with pain 1-5 both feet     Plan:  Debride painful nailbeds 1-5 both feet with no iatrogenic bleeding noted

## 2017-08-26 LAB — CUP PACEART REMOTE DEVICE CHECK
Implantable Pulse Generator Implant Date: 20180502
MDC IDC SESS DTM: 20190507023526

## 2017-09-01 ENCOUNTER — Telehealth: Payer: Self-pay | Admitting: *Deleted

## 2017-09-01 MED ORDER — ATORVASTATIN CALCIUM 40 MG PO TABS: 40.0000 mg | ORAL_TABLET | Freq: Every day | ORAL | 2 refills | Status: DC

## 2017-09-01 MED ORDER — ATENOLOL 50 MG PO TABS: ORAL_TABLET | ORAL | 2 refills | Status: DC

## 2017-09-01 MED ORDER — EMPAGLIFLOZIN 25 MG PO TABS: 25.0000 mg | ORAL_TABLET | Freq: Every day | ORAL | 2 refills | Status: DC

## 2017-09-01 NOTE — Telephone Encounter (Signed)
Patient is still taking Citalopram 10mg  1/2 tablet every other day. Patient is wondering if it will be good for her to just stop them because she finds herself wanting to cry at times. Not in current medication list. Please Advise.

## 2017-09-01 NOTE — Telephone Encounter (Signed)
It sounds like she should not stop the citalopram (we were gradually reducing this).  Perhaps, she should got back to the 10mg  daily of citalopram.

## 2017-09-02 ENCOUNTER — Other Ambulatory Visit: Payer: Self-pay | Admitting: Internal Medicine

## 2017-09-02 MED ORDER — CITALOPRAM HYDROBROMIDE 10 MG PO TABS: 10.0000 mg | ORAL_TABLET | Freq: Every day | ORAL | 3 refills | Status: DC

## 2017-09-02 NOTE — Telephone Encounter (Signed)
LMOM to return call.

## 2017-09-02 NOTE — Telephone Encounter (Signed)
Patient Notified and agreed. Medication list updated. Rx faxed to [facility].

## 2017-09-08 ENCOUNTER — Ambulatory Visit (INDEPENDENT_AMBULATORY_CARE_PROVIDER_SITE_OTHER): Payer: Medicare Other | Admitting: *Deleted

## 2017-09-08 DIAGNOSIS — I639 Cerebral infarction, unspecified: Secondary | ICD-10-CM

## 2017-09-08 NOTE — Progress Notes (Signed)
Carelink Summary Report / Loop Recorder 

## 2017-09-09 ENCOUNTER — Telehealth: Payer: Self-pay | Admitting: *Deleted

## 2017-09-09 NOTE — Telephone Encounter (Signed)
Spoke with patient regarding sending manual transmission. Attempted sending manual transmission, error code [id number] received. Gave patient [facility] [employer] number to call. Advised patient we would call her back sometime this week and monitor for manual transmission.

## 2017-09-12 ENCOUNTER — Other Ambulatory Visit: Payer: Self-pay | Admitting: Internal Medicine

## 2017-09-12 NOTE — Telephone Encounter (Signed)
Attempted to reach patient regarding receiving manual transmission. 9 pause episodes, longest duration 3 seconds. 1 brady episode, duration 8 seconds, Avg V rate 33 bpm. Will attempt to call back at a later time.

## 2017-09-15 NOTE — Telephone Encounter (Signed)
Spoke with patient regarding pause and brady episodes. Patient states no syncope or presyncope episodes. Advised patient to call if syncope or presyncope episodes occur. Patient verbalized understanding. Will place in Dr. Tanna Furry folder for review.

## 2017-09-19 DIAGNOSIS — E119 Type 2 diabetes mellitus without complications: Secondary | ICD-10-CM | POA: Diagnosis not present

## 2017-09-19 LAB — HM DIABETES EYE EXAM

## 2017-10-01 ENCOUNTER — Ambulatory Visit (INDEPENDENT_AMBULATORY_CARE_PROVIDER_SITE_OTHER): Payer: Medicare Other | Admitting: Internal Medicine

## 2017-10-01 ENCOUNTER — Encounter: Payer: Self-pay | Admitting: Internal Medicine

## 2017-10-01 VITALS — BP 122/80 | HR 52 | Temp 97.7°F | Ht 66.0 in | Wt 170.0 lb

## 2017-10-01 DIAGNOSIS — Z87891 Personal history of nicotine dependence: Secondary | ICD-10-CM | POA: Diagnosis not present

## 2017-10-01 DIAGNOSIS — I639 Cerebral infarction, unspecified: Secondary | ICD-10-CM

## 2017-10-01 DIAGNOSIS — J209 Acute bronchitis, unspecified: Secondary | ICD-10-CM | POA: Diagnosis not present

## 2017-10-01 DIAGNOSIS — J302 Other seasonal allergic rhinitis: Secondary | ICD-10-CM | POA: Diagnosis not present

## 2017-10-01 MED ORDER — PREDNISONE 10 MG PO TABS: ORAL_TABLET | ORAL | 0 refills | Status: DC

## 2017-10-01 MED ORDER — BENZONATATE 100 MG PO CAPS: 100.0000 mg | ORAL_CAPSULE | Freq: Three times a day (TID) | ORAL | 1 refills | Status: DC | PRN

## 2017-10-01 MED ORDER — AZITHROMYCIN 250 MG PO TABS: ORAL_TABLET | ORAL | 0 refills | Status: DC

## 2017-10-01 NOTE — Patient Instructions (Addendum)
START AZITHROMYCIN AS DIRECTED FOR LUNG INFECTION  START ALBUTEROL INHALER - USE 2 PUFFS 3 TIMES DAILY X 7 DAYS THEN 2 TIMES DAILY X 7 DAYS THEN 1 TIME DAILY X 7 DAYS AND STOP  PREDNISONE TAPER - TAKE 4 TABS DAILY X 2 DAYS THEN 3 TABS DAILY X 2 DAYS THEN 2 TABS DAILY X 2 DAYS THEN 1 TAB DAILY X 2 DAYS AND STOP  RECOMMEND PROBIOTIC DAILY WHILE ON ANTIBIOTIC TO KEEP COLON HEALTHY  TAKE TESSALON PERLES AS NEEDED FOR COUGH  Push fluids and rest  Follow up as scheduled with Dr Mariea Clonts or sooner if need be   Acute Bronchitis, Adult Acute bronchitis is when air tubes (bronchi) in the lungs suddenly get swollen. The condition can make it hard to breathe. It can also cause these symptoms:  A cough.  Coughing up clear, yellow, or green mucus.  Wheezing.  Chest congestion.  Shortness of breath.  A fever.  Body aches.  Chills.  A sore throat.  Follow these instructions at home: Medicines  Take over-the-counter and prescription medicines only as told by your doctor.  If you were prescribed an antibiotic medicine, take it as told by your doctor. Do not stop taking the antibiotic even if you start to feel better. General instructions  Rest.  Drink enough fluids to keep your pee (urine) clear or pale yellow.  Avoid smoking and secondhand smoke. If you smoke and you need help quitting, ask your doctor. Quitting will help your lungs heal faster.  Use an inhaler, cool mist vaporizer, or humidifier as told by your doctor.  Keep all follow-up visits as told by your doctor. This is important. How is this prevented? To lower your risk of getting this condition again:  Wash your hands often with soap and water. If you cannot use soap and water, use hand sanitizer.  Avoid contact with people who have cold symptoms.  Try not to touch your hands to your mouth, nose, or eyes.  Make sure to get the flu shot every year.  Contact a doctor if:  Your symptoms do not get better in 2  weeks. Get help right away if:  You cough up blood.  You have chest pain.  You have very bad shortness of breath.  You become dehydrated.  You faint (pass out) or keep feeling like you are going to pass out.  You keep throwing up (vomiting).  You have a very bad headache.  Your fever or chills gets worse. This information is not intended to replace advice given to you by your health care provider. Make sure you discuss any questions you have with your health care provider. Document Released: 09/04/2007 Document Revised: 10/25/2015 Document Reviewed: 09/06/2015 Elsevier Interactive Patient Education  [employer].

## 2017-10-01 NOTE — Progress Notes (Signed)
Patient ID: Penny Miller, female   DOB: 1932-01-05, 84 y.o.   MRN: [id number]   [facility] OFFICE  Provider: DR Arletha Grippe  Code Status:  Goals of Care:  Advanced Directives 07/21/2017  Does Patient Have a Medical Advance Directive? No  Type of Advance Directive -  Does patient want to make changes to medical advance directive? -  Copy of [place] in Chart? -  Would patient like information on creating a medical advance directive? Yes (MAU/Ambulatory/Procedural Areas - Information given)     Chief Complaint  Patient presents with  . Acute Visit    Patient with sore throat, earache, cough, drainage, and chest congestion x 1.5 weeks (patient states if she eats chocolate ice cream it soothes her)     HPI: Patient is a 84 y.o. female seen today for an acute visit for URI sx's x 1 week. She saw her eye provider for regular visit and she had clear watery d/c. Nothing Rx. She has increased fatigue. Nothing tried OTC but continues to take regular meds. She has a hx DM and past hx tob abuse. CBGs 120s. No low BS reactions. A1c 6.4%  Sore throat (+) HA (+) occasional  Dizziness (-) CP (-) but has tightness SOB (+)  Wheezing (+) Cough (+) at night that is deep with yellow sputum pdtn Post nasal drip (+) Sinus (+) pressure Ear (+) right intermittent pain but no d/c Nasal (+) rhinorrhea Fever (-) Chills (-) (+) voice hoarseness  She is a poor historian due to memory loss. Hx obtained from chart.  Past Medical History:  Diagnosis Date  . Allergic rhinitis, cause unspecified   . Anxiety state, unspecified   . Diaphragmatic hernia without mention of obstruction or gangrene   . Diverticulosis of colon (without mention of hemorrhage)   . Female stress incontinence   . Insomnia, unspecified   . Interstitial cystitis    surgery 02/01/2013  . Irritable bowel syndrome   . Memory loss   . Other and unspecified hyperlipidemia   . Other premature beats   .  Polyneuropathy in diabetes(357.2)   . Postmenopausal bleeding   . Proteinuria   . Stroke ([place])   . 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 Unspecified essential hypertension   . Viral warts, unspecified     Past Surgical History:  Procedure Laterality Date  . ABDOMINAL HYSTERECTOMY  1977   Dr.Hambright  . APPENDECTOMY  1965  . CATARACT EXTRACTION, BILATERAL  02/10/2006   Dr.Groat   . COLONOSCOPY  07/17/2004   polypectomy  . [place]  . LOOP RECORDER INSERTION N/A 07/31/2016   Procedure: Loop Recorder Insertion;  Surgeon: [name], MD;  Location: [facility];  Service: Cardiovascular;  Laterality: N/A;  . TEE WITHOUT CARDIOVERSION N/A 07/30/2016   Procedure: TRANSESOPHAGEAL ECHOCARDIOGRAM (TEE);  Surgeon: [name], MD;  Location: [facility];  Service: Cardiovascular;  Laterality: N/A;  . [place]  . TRIGGER FINGER RELEASE  05/13/16  . urinary bladder      surgery 02/01/2013 to stretch bladder stem     reports that she quit smoking about 6 years ago. She has a 15.50 pack-year smoking history. She has never used smokeless tobacco. She reports that she does not drink alcohol or use drugs. Social History   Socioeconomic History  . Marital status: Widowed  Spouse name: Not on file  . Number of children: 1  . Years of education: Not on file  . Highest education level: Not on file  Occupational History  . 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 Financial resource strain: Not hard at all  . Food insecurity:    Worry: Never true    Inability: Never true  . Transportation needs:    Medical: No    Non-medical: No  Tobacco Use  . Smoking status: Former Smoker    Packs/day: 0.25    Years: 62.00    Pack years: 15.50    Last attempt to quit: 04/02/2011    Years since quitting: 6.5  . Smokeless tobacco:  Never Used  . Tobacco comment: stopped consistently smoking in 2012 but still smokes occasionally (one pack per week)  Substance and Sexual Activity  . Alcohol use: No  . Drug use: No  . Sexual activity: Never  Lifestyle  . Physical activity:    Days per week: 7 days    Minutes per session: 10 min  . Stress: Not at all  Relationships  . Social connections:    Talks on phone: More than three times a week    Gets together: More than three times a week    Attends religious service: Never    Active member of club or organization: No    Attends meetings of clubs or organizations: Never    Relationship status: Widowed  . Intimate partner violence:    Fear of current or ex partner: No    Emotionally abused: No    Physically abused: No    Forced sexual activity: No  Other Topics Concern  . Not on file  Social History Narrative   Ophthalmologist-Dr.Gould and Dr.Groat   Podiatrist- Dr.Tuchman   Dermatologist- Dr.Lupton   Urologist- Dr. Lawerance Bach    Lives with her son.     Family History  Problem Relation Age of Onset  . Cancer Mother        pancreatic  . Cancer Father        bladder  . Heart attack Father   . Hypertension Sister   . Diabetes Sister   . Diabetes Brother   . Kidney cancer Brother   . Hypertension Brother   . Diabetes Brother   . Heart disease Brother 58       Myocardial Infarction     Allergies  Allergen Reactions  . Metformin And Related Diarrhea  . Ceclor [Cefaclor] Other (See Comments)    Unknown reaction; not recalled  . Codeine Nausea And Vomiting  . Lopid [Gemfibrozil] Other (See Comments)    Unknown reaction; not recalled  . Metoprolol Nausea And Vomiting  . Morphine And Related Other (See Comments)    Passed out  . Niacin And Related Other (See Comments)    Unknown reaction; not recalled  . Relafen [Nabumetone] Nausea And Vomiting  . Septra [Sulfamethoxazole-Trimethoprim] Nausea And Vomiting  . Tetracycline Nausea And Vomiting  . Toprol  Xl [Metoprolol Tartrate] Nausea And Vomiting  . Adhesive [Tape] Rash and Other (See Comments)    Tears the skin also    Outpatient Encounter Medications as of 10/01/2017  Medication Sig  . acetaminophen (TYLENOL) 325 MG tablet Take 325-650 mg by mouth daily as needed (for headaches).   Marland Kitchen albuterol (PROVENTIL HFA;VENTOLIN HFA) 108 (90 Base) MCG/ACT inhaler Inhale 2 puffs into the lungs every 6 (six) hours as needed for wheezing  or shortness of breath.  Marland Kitchen amLODipine (NORVASC) 2.5 MG tablet Take one tablet twice daily for blood pressure  . apixaban (ELIQUIS) 5 MG TABS tablet Take 1 tablet (5 mg total) by mouth 2 (two) times daily.  Marland Kitchen atenolol (TENORMIN) 50 MG tablet Take one tablet by mouth once daily to help blood pressure  . atorvastatin (LIPITOR) 40 MG tablet Take 1 tablet (40 mg total) by mouth daily at 6 PM.  . calcium carbonate (TUMS EX) 750 MG chewable tablet Chew 1 tablet by mouth daily as needed for heartburn.   . citalopram (CELEXA) 10 MG tablet Take 1 tablet (10 mg total) by mouth daily.  . empagliflozin (JARDIANCE) 25 MG TABS tablet Take 25 mg by mouth daily. To control blood sugar  . gabapentin (NEURONTIN) 300 MG capsule TAKE 1 CAPSULE TWICE DAILY  . glucose blood (ACCU-CHEK AVIVA PLUS) test strip Use to test blood sugar twice daily. Dx: E11.29  . Lancets (ACCU-CHEK MULTICLIX) lancets Use to test blood sugar twice daily. E11.29  . meclizine (ANTIVERT) 25 MG tablet Take 1 tablet (25 mg total) by mouth 3 (three) times daily as needed for dizziness.  . pioglitazone (ACTOS) 15 MG tablet Take 1 tablet (15 mg total) by mouth daily.  . Vitamin D, Ergocalciferol, (DRISDOL) 50000 units CAPS capsule TAKE 1 CAPSULE WEEKLY FOR  VITAMIN D  . [DISCONTINUED] doxycycline (VIBRA-TABS) 100 MG tablet Take 1 tablet (100 mg total) by mouth 2 (two) times daily. (Patient not taking: Reported on 10/01/2017)   No facility-administered encounter medications on file as of 10/01/2017.     Review of Systems:    Review of Systems  Constitutional: Positive for fatigue.  HENT: Positive for ear pain, postnasal drip, rhinorrhea, sinus pressure, sore throat and voice change.   Respiratory: Positive for cough.   Neurological: Positive for headaches.  All other systems reviewed and are negative.   Health Maintenance  Topic Date Due  . FOOT EXAM  06/22/2015  . OPHTHALMOLOGY EXAM  12/25/2016  . PNA vac Low Risk Adult (2 of 2 - PPSV23) 05/07/2017  . INFLUENZA VACCINE  10/30/2017  . URINE MICROALBUMIN  11/05/2017  . HEMOGLOBIN A1C  01/16/2018  . TETANUS/TDAP  09/15/2018  . DEXA SCAN  Completed    Physical Exam: Vitals:   10/01/17 0947  BP: 122/80  Pulse: (!) 52  Temp: 97.7 F (36.5 C)  TempSrc: Oral  SpO2: 96%  Weight: 170 lb (77.1 kg)  Height: 5\' 6"  (1.676 m)   Body mass index is 27.44 kg/m. Physical Exam  Constitutional: She appears well-developed and well-nourished.  HENT:  Mouth/Throat: No oropharyngeal exudate.  TMs intact b/l, nonbulging; no redness. No maxillary sinus TTP but has boggy tissue texture changes; nares with enlarged grey dry turbinates.; oropharynx cobblestoning and redness but no exudate  Eyes: Pupils are equal, round, and reactive to light. No scleral icterus.  Neck: Neck supple. Carotid bruit is not present.  Cardiovascular: Normal rate, regular rhythm and intact distal pulses. Exam reveals no gallop and no friction rub.  Murmur (1/6 SEM) heard. No LE edema b/l. no calf TTP.   Pulmonary/Chest: Effort normal. No stridor. No respiratory distress. She has wheezes (end expiratory with prolonged expiratory phase). She has no rales.  Abdominal: Soft. Normal appearance and bowel sounds are normal. She exhibits no distension and no mass. There is no hepatomegaly. There is no tenderness. There is no rigidity, no rebound and no guarding. No hernia.  Lymphadenopathy:    She has no cervical  adenopathy.  Neurological: She is alert. She has normal reflexes.  Skin: Skin is  warm and dry. No rash noted.  Psychiatric: She has a normal mood and affect. Her behavior is normal. Thought content normal.    Labs reviewed: Basic Metabolic Panel: Recent Labs    05/12/17 1435 06/15/17 1631 07/17/17 0844  NA 139 136 137  K 4.3 3.8 4.6  CL 104 107 104  CO2 29 20* 27  GLUCOSE 101 91 115*  BUN 16 14 24   CREATININE 1.09* 1.09* 1.59*  CALCIUM 9.7 8.4* 9.6   Liver Function Tests: Recent Labs    03/17/17 0920 05/12/17 1435  AST 14 11  ALT 10 10  BILITOT 0.5 0.4  PROT 6.3 6.6   Recent Labs    05/12/17 1435  LIPASE 70*  AMYLASE 33   No results for input(s): AMMONIA in the last 8760 hours. CBC: Recent Labs    03/17/17 0920 05/12/17 1435 06/15/17 1631 07/17/17 0844  WBC 8.1 9.6 7.9 8.7  NEUTROABS 5,532 6,317  --  6,308  HGB 12.9 13.2 10.2* 12.9  HCT 38.2 38.5 30.9* 38.2  MCV 93.6 91.2 96.6 92.0  PLT 168 178 166 178   Lipid Panel: Recent Labs    11/05/16 0906 03/17/17 0920  CHOL 127 135  HDL 43* 40*  LDLCALC 55 69  TRIG 146 179*  CHOLHDL 3.0 3.4   Lab Results  Component Value Date   HGBA1C 6.4 (H) 07/17/2017    Procedures since last visit: No results found.  Assessment/Plan   ICD-10-CM   1. Acute bronchitis, unspecified organism J20.9 predniSONE (DELTASONE) 10 MG tablet    azithromycin (ZITHROMAX) 250 MG tablet  2. History of tobacco abuse Z87.891 azithromycin (ZITHROMAX) 250 MG tablet  3. Seasonal allergic rhinitis, unspecified trigger J30.2    START AZITHROMYCIN AS DIRECTED FOR LUNG INFECTION  START ALBUTEROL INHALER - USE 2 PUFFS 3 TIMES DAILY X 7 DAYS THEN 2 TIMES DAILY X 7 DAYS THEN 1 TIME DAILY X 7 DAYS AND STOP  PREDNISONE TAPER - TAKE 4 TABS DAILY X 2 DAYS THEN 3 TABS DAILY X 2 DAYS THEN 2 TABS DAILY X 2 DAYS THEN 1 TAB DAILY X 2 DAYS AND STOP  RECOMMEND PROBIOTIC DAILY WHILE ON ANTIBIOTIC TO KEEP COLON HEALTHY  TAKE TESSALON PERLES AS NEEDED FOR COUGH  She declined in office neb tx  Push fluids and rest  Follow  up as scheduled with Dr Mariea Clonts or sooner if need be  May need antihistamine daily for seasonal allergy   Ramesses Crampton S. Perlie Gold  [facility] [address] [place], [place] [address] [phone] Cell (Monday-Friday 8 AM - 5 PM) [phone] After 5 PM and follow prompts

## 2017-10-08 ENCOUNTER — Other Ambulatory Visit: Payer: Self-pay | Admitting: *Deleted

## 2017-10-08 MED ORDER — ALBUTEROL SULFATE HFA 108 (90 BASE) MCG/ACT IN AERS: 2.0000 | INHALATION_SPRAY | Freq: Four times a day (QID) | RESPIRATORY_TRACT | 0 refills | Status: DC | PRN

## 2017-10-08 NOTE — Telephone Encounter (Signed)
Patient requested refill

## 2017-10-09 ENCOUNTER — Encounter: Payer: Self-pay | Admitting: *Deleted

## 2017-10-09 ENCOUNTER — Ambulatory Visit (INDEPENDENT_AMBULATORY_CARE_PROVIDER_SITE_OTHER): Payer: Medicare Other | Admitting: *Deleted

## 2017-10-09 DIAGNOSIS — I639 Cerebral infarction, unspecified: Secondary | ICD-10-CM

## 2017-10-10 NOTE — Progress Notes (Signed)
Carelink Summary Report / Loop Recorder 

## 2017-10-14 LAB — CUP PACEART REMOTE DEVICE CHECK
Implantable Pulse Generator Implant Date: 20180502
MDC IDC SESS DTM: 20190609024004

## 2017-11-11 ENCOUNTER — Ambulatory Visit (INDEPENDENT_AMBULATORY_CARE_PROVIDER_SITE_OTHER): Payer: Medicare Other | Admitting: *Deleted

## 2017-11-11 DIAGNOSIS — I639 Cerebral infarction, unspecified: Secondary | ICD-10-CM | POA: Diagnosis not present

## 2017-11-12 NOTE — Progress Notes (Signed)
Carelink Summary Report / Loop Recorder 

## 2017-11-14 ENCOUNTER — Ambulatory Visit (INDEPENDENT_AMBULATORY_CARE_PROVIDER_SITE_OTHER): Payer: Medicare Other | Admitting: Podiatry

## 2017-11-14 ENCOUNTER — Encounter: Payer: Self-pay | Admitting: Podiatry

## 2017-11-14 DIAGNOSIS — M79675 Pain in left toe(s): Secondary | ICD-10-CM

## 2017-11-14 DIAGNOSIS — D689 Coagulation defect, unspecified: Secondary | ICD-10-CM

## 2017-11-14 DIAGNOSIS — B351 Tinea unguium: Secondary | ICD-10-CM

## 2017-11-14 DIAGNOSIS — M79674 Pain in right toe(s): Secondary | ICD-10-CM | POA: Diagnosis not present

## 2017-11-15 NOTE — Progress Notes (Signed)
Subjective:   Patient ID: Penny Miller, female   DOB: 84 y.o.   MRN: [id number]   HPI Patient presents with significant nail disease that are thick and she cannot cut 1-5 both feet with long-term diabetes   ROS      Objective:  Physical Exam  Thick yellow brittle nailbeds 1-5 both feet with pain and incurvation of the corners     Assessment:  Mycotic nail infection with pain 1-5 both feet     Plan:  Debride painful nailbeds 1-5 both feet with no iatrogenic bleeding noted and reappoint

## 2017-11-18 ENCOUNTER — Other Ambulatory Visit: Payer: Self-pay | Admitting: Internal Medicine

## 2017-11-20 LAB — CUP PACEART REMOTE DEVICE CHECK
Date Time Interrogation Session: 20190712023936
Implantable Pulse Generator Implant Date: 20180502

## 2017-11-27 ENCOUNTER — Ambulatory Visit (INDEPENDENT_AMBULATORY_CARE_PROVIDER_SITE_OTHER): Payer: Medicare Other | Admitting: Family

## 2017-11-27 ENCOUNTER — Encounter: Payer: Self-pay | Admitting: Family

## 2017-11-27 ENCOUNTER — Telehealth: Payer: Self-pay

## 2017-11-27 VITALS — BP 146/84 | HR 56 | Temp 98.0°F | Resp 14 | Ht 66.0 in | Wt 174.2 lb

## 2017-11-27 DIAGNOSIS — L989 Disorder of the skin and subcutaneous tissue, unspecified: Secondary | ICD-10-CM | POA: Diagnosis not present

## 2017-11-27 DIAGNOSIS — I639 Cerebral infarction, unspecified: Secondary | ICD-10-CM | POA: Diagnosis not present

## 2017-11-27 DIAGNOSIS — E1129 Type 2 diabetes mellitus with other diabetic kidney complication: Secondary | ICD-10-CM

## 2017-11-27 MED ORDER — PIOGLITAZONE HCL 30 MG PO TABS: 30.0000 mg | ORAL_TABLET | Freq: Every day | ORAL | 0 refills | Status: DC

## 2017-11-27 NOTE — Telephone Encounter (Signed)
Patient verbalized understanding. She did not have any additional questions at this time.

## 2017-11-27 NOTE — Patient Instructions (Addendum)
1. Increase Actos to 30 mg tablet daily for diabetes 2. Eat low carbohydrates,low fat and high vegetable diet  3. Continue to walking exercise  4. Follow up with dermatology for evaluation of right thigh skin lesion 5. Follow up with [facility] on 12/08/2017 for lab work as scheduled.

## 2017-11-27 NOTE — Progress Notes (Signed)
Provider: Dinah Ngetich FNP-C  [name], DO  Patient Care Team: [name], DO as PCP - General (Geriatric Medicine) Ulice Bold, MD as Referring Physician (Dermatology) Love, Alyson Locket, MD as Consulting Physician (Neurology) Sharyne Peach, MD as Consulting Physician (Ophthalmology) Gean Birchwood, DPM as Consulting Physician (Podiatry) Ronald Lobo, MD as Consulting Physician (Gastroenterology) Lomax, Marny Lowenstein, MD (Inactive) as Consulting Physician (Gynecology) Druscilla Brownie, MD as Consulting Physician (Dermatology) Clent Jacks, MD as Consulting Physician (Ophthalmology) Myrlene Broker, MD as Attending Physician (Urology) Minus Breeding, MD as Consulting Physician (Cardiology)  Extended Emergency Contact Information Primary Emergency Contact: Towery,[name] Address: [address], [place] Phone: [phone] Relation: Son Secondary Emergency Contact: Murtis Sink [place] Phone: [phone] Relation: Granddaughter   Goals of care: Advanced Directive information Advanced Directives 11/27/2017  Does Patient Have a Medical Advance Directive? No  Type of Advance Directive -  Does patient want to make changes to medical advance directive? -  Copy of [place] in Chart? -  Would patient like information on creating a medical advance directive? -     Chief Complaint  Patient presents with  . Hyperglycemia    Had to stop Metformin and started Jardiance and has been higher since     HPI:  Pt is a 84 y.o. female seen today at [facility] office for an acute visit for evaluation of high blood sugars.she brought her CBG readings to visit today.Her CBG readings in the morning ranging in the 140's-180's with x 2 readings in the low 200's and evening readings in the 120's-190's x 3 readings in the low 200's.she states has been craving chocolate ice cream sometimes.  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    Past Medical History:  Diagnosis Date  . Allergic rhinitis, cause unspecified   . Anxiety state, unspecified   . Diaphragmatic hernia without mention of obstruction or gangrene   . Diverticulosis of colon (without mention of hemorrhage)   . Female stress incontinence   . Insomnia, unspecified   . Interstitial cystitis    surgery 02/01/2013  . Irritable bowel syndrome   . Memory loss   . Other and unspecified hyperlipidemia   . Other premature beats   . Polyneuropathy in diabetes(357.2)   . Postmenopausal bleeding   . Proteinuria   . Stroke ([place])   . 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 Unspecified essential hypertension   . Viral warts, unspecified    Past Surgical History:  Procedure Laterality Date  . ABDOMINAL HYSTERECTOMY  1977   Dr.Hambright  . APPENDECTOMY  1965  . CATARACT EXTRACTION, BILATERAL  02/10/2006   Dr.Groat   . COLONOSCOPY  07/17/2004   polypectomy  . [place]  . LOOP RECORDER INSERTION N/A 07/31/2016   Procedure: Loop Recorder Insertion;  Surgeon: [name], MD;  Location: [facility];  Service: Cardiovascular;  Laterality: N/A;  . TEE WITHOUT CARDIOVERSION N/A 07/30/2016   Procedure: TRANSESOPHAGEAL ECHOCARDIOGRAM (TEE);  Surgeon: [name], MD;  Location: [facility];  Service: Cardiovascular;  Laterality: N/A;  . [place]  .  TRIGGER FINGER RELEASE  05/13/16  . urinary bladder      surgery 02/01/2013 to stretch bladder stem    Allergies  Allergen Reactions  . Metformin And Related Diarrhea  . Ceclor [Cefaclor] Other (See Comments)    Unknown reaction; not recalled  . Codeine  Nausea And Vomiting  . Lopid [Gemfibrozil] Other (See Comments)    Unknown reaction; not recalled  . Metoprolol Nausea And Vomiting  . Morphine And Related Other (See Comments)    Passed out  . Niacin And Related Other (See Comments)    Unknown reaction; not recalled  . Relafen [Nabumetone] Nausea And Vomiting  . Septra [Sulfamethoxazole-Trimethoprim] Nausea And Vomiting  . Tetracycline Nausea And Vomiting  . Toprol Xl [Metoprolol Tartrate] Nausea And Vomiting  . Adhesive [Tape] Rash and Other (See Comments)    Tears the skin also    Outpatient Encounter Medications as of 11/27/2017  Medication Sig  . acetaminophen (TYLENOL) 325 MG tablet Take 325-650 mg by mouth daily as needed (for headaches).   Marland Kitchen albuterol (PROVENTIL HFA;VENTOLIN HFA) 108 (90 Base) MCG/ACT inhaler Inhale 2 puffs into the lungs every 6 (six) hours as needed for wheezing or shortness of breath.  Marland Kitchen amLODipine (NORVASC) 2.5 MG tablet Take one tablet twice daily for blood pressure  . apixaban (ELIQUIS) 5 MG TABS tablet Take 1 tablet (5 mg total) by mouth 2 (two) times daily.  Marland Kitchen atenolol (TENORMIN) 50 MG tablet Take one tablet by mouth once daily to help blood pressure  . atorvastatin (LIPITOR) 40 MG tablet Take 1 tablet (40 mg total) by mouth daily at 6 PM.  . calcium carbonate (TUMS EX) 750 MG chewable tablet Chew 1 tablet by mouth daily as needed for heartburn.   . citalopram (CELEXA) 10 MG tablet Take 1 tablet (10 mg total) by mouth daily.  . empagliflozin (JARDIANCE) 25 MG TABS tablet Take 25 mg by mouth daily. To control blood sugar  . gabapentin (NEURONTIN) 300 MG capsule TAKE 1 CAPSULE TWICE DAILY  . glucose blood (ACCU-CHEK AVIVA PLUS) test strip Use to test blood sugar twice daily. Dx: E11.29  . Lancets (ACCU-CHEK MULTICLIX) lancets Use to test blood sugar twice daily. E11.29  . meclizine (ANTIVERT) 25 MG tablet Take 1 tablet (25 mg total) by mouth 3 (three) times daily as needed for dizziness.  . Vitamin D,  Ergocalciferol, (DRISDOL) 50000 units CAPS capsule TAKE 1 CAPSULE WEEKLY FOR  VITAMIN D  . pioglitazone (ACTOS) 30 MG tablet Take 1 tablet (30 mg total) by mouth daily.  . [DISCONTINUED] azithromycin (ZITHROMAX) 250 MG tablet Take 2 tabs po on day 1 then 1 tab po daily x 4 days  . [DISCONTINUED] benzonatate (TESSALON) 100 MG capsule Take 1 capsule (100 mg total) by mouth 3 (three) times daily as needed for cough.  . [DISCONTINUED] pioglitazone (ACTOS) 15 MG tablet Take 1 tablet (15 mg total) by mouth daily. (Patient not taking: Reported on 11/27/2017)  . [DISCONTINUED] predniSONE (DELTASONE) 10 MG tablet Take 4 tabs po daily x 2 days then 3 tabs daily x 2 days then 2 tabs daily x 2 days then 1 tab daily x 2 days and stop   No facility-administered encounter medications on file as of 11/27/2017.     Review of Systems  Constitutional: Negative for appetite change, chills, fatigue, fever and unexpected weight change.  HENT: Negative for congestion, rhinorrhea, sinus pressure, sinus pain, sneezing and sore throat.   Eyes: Negative for discharge, redness, itching and visual disturbance.  Respiratory: Negative for cough, chest tightness, shortness of breath and wheezing.   Cardiovascular: Negative for chest pain, palpitations and leg swelling.  Gastrointestinal: Negative for abdominal distention, abdominal pain, constipation, diarrhea, nausea and vomiting.  Endocrine: Negative for cold intolerance, heat intolerance, polydipsia, polyphagia and polyuria.  Genitourinary: Negative for frequency, urgency and vaginal pain.  Skin: Negative for color change, pallor and rash.       Worsening right thigh skin lesion     Immunization History  Administered Date(s) Administered  . Influenza, High Dose Seasonal PF 12/31/2016  . Influenza,inj,Quad PF,6+ Mos 12/15/2012, 12/21/2013, 02/28/2015, 02/02/2016  . Influenza-Unspecified 01/22/2012  . Pneumococcal Conjugate-13 05/07/2016  . Pneumococcal-Unspecified  01/24/1995  . Td 09/14/2008   Pertinent  Health Maintenance Due  Topic Date Due  . FOOT EXAM  06/22/2015  . PNA vac Low Risk Adult (2 of 2 - PPSV23) 05/07/2017  . INFLUENZA VACCINE  10/30/2017  . URINE MICROALBUMIN  11/05/2017  . HEMOGLOBIN A1C  01/16/2018  . OPHTHALMOLOGY EXAM  09/20/2018  . DEXA SCAN  Completed   Fall Risk  10/01/2017 07/21/2017 05/12/2017 03/28/2017 03/20/2017  Falls in the past year? No Yes No Yes No  Number falls in past yr: - 1 - 1 -  Comment - - - - -  Injury with Fall? - No - Yes -  Comment - - - bruised rib -  Risk for fall due to : - - - (No Data) -  Risk for fall due to: Comment - - - stumbled, foot stuck to carpet -  Follow up - - - - -    Vitals:   11/27/17 0908  BP: (!) 146/84  Pulse: (!) 56  Resp: 14  Temp: 98 F (36.7 C)  TempSrc: Oral  SpO2: 96%  Weight: 174 lb 3.2 oz (79 kg)  Height: 5\' 6"  (1.676 m)   Body mass index is 28.12 kg/m. Physical Exam  Constitutional: She is oriented to person, place, and time. She appears well-developed and well-nourished.  HENT:  Head: Normocephalic.  Right Ear: External ear normal.  Left Ear: External ear normal.  Mouth/Throat: Oropharynx is clear and moist. No oropharyngeal exudate.  Eyes: Pupils are equal, round, and reactive to light. Conjunctivae and EOM are normal. Right eye exhibits no discharge. Left eye exhibits no discharge. No scleral icterus.  Neck: Normal range of motion. No JVD present. No thyromegaly present.  Cardiovascular: Normal rate, regular rhythm, normal heart sounds and intact distal pulses. Exam reveals no gallop and no friction rub.  No murmur heard. Pulmonary/Chest: Effort normal and breath sounds normal. No respiratory distress. She has no wheezes. She has no rales.  Abdominal: Soft. Bowel sounds are normal. She exhibits no distension and no mass. There is no tenderness. There is no rebound and no guarding.  Musculoskeletal: Normal range of motion. She exhibits no edema or  tenderness.  Lymphadenopathy:    She has no cervical adenopathy.  Neurological: She is oriented to person, place, and time.  Skin: Skin is warm and dry. No rash noted. No erythema. No pallor.  Right lateral thigh brown in color skin lesion > 6 mm with irregular boarders with some areas rough texture to touch.  Psychiatric: She has a normal mood and affect. Her behavior is normal.  Vitals reviewed.   Labs reviewed: Recent Labs    05/12/17 1435 06/15/17 1631 07/17/17 0844  NA 139 136 137  K 4.3 3.8 4.6  CL 104 107 104  CO2 29 20* 27  GLUCOSE 101 91 115*  BUN 16 14 24   CREATININE 1.09* 1.09* 1.59*  CALCIUM 9.7 8.4* 9.6   Recent Labs    03/17/17 0920 05/12/17 1435  AST 14 11  ALT 10 10  BILITOT 0.5 0.4  PROT 6.3 6.6   Recent Labs    03/17/17 0920 05/12/17 1435 06/15/17 1631 07/17/17 0844  WBC 8.1 9.6 7.9 8.7  NEUTROABS 5,532 6,317  --  6,308  HGB 12.9 13.2 10.2* 12.9  HCT 38.2 38.5 30.9* 38.2  MCV 93.6 91.2 96.6 92.0  PLT 168 178 166 178   Lab Results  Component Value Date   TSH 4.432 07/29/2016   Lab Results  Component Value Date   HGBA1C 6.4 (H) 07/17/2017   Lab Results  Component Value Date   CHOL 135 03/17/2017   HDL 40 (L) 03/17/2017   LDLCALC 69 03/17/2017   TRIG 179 (H) 03/17/2017   CHOLHDL 3.4 03/17/2017    Significant Diagnostic Results in last 30 days:  No results found.  Assessment/Plan 1. 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  - pioglitazone (ACTOS) 30 MG tablet; Take 1 tablet (30 mg total) by mouth daily.  Dispense: 30 tablet;  Refill: 0  2. Skin lesion Right lateral thigh brown in color skin lesion > 6 mm with irregular boarders with some areas rough texture to touch.Given history of similar cancerous skin lesion will refer for dermatology evaluation.  Family/ staff Communication: Reviewed plan of care with patient and facility Nurse.  Labs/tests ordered: None   Dinah C Ngetich, NP

## 2017-11-27 NOTE — Telephone Encounter (Signed)
Patient called to say that she was mistaken during her visit today about taking the actos. She stated that she has not been taking this medication since May because Dr. Mariea Clonts stopped it.  There is a phone message dated for 08/04/17 where Dr. Mariea Clonts did instruct patient to stop the Actos.    So now patient wants to know if she is to start taking the Actos 30 mg that you prescribed today or is there another medication that you want her to try?    Please advise

## 2017-11-27 NOTE — Telephone Encounter (Signed)
Do not restart Actos.continue on low carbo diet,No concentrated sweets diet then follow up for lab working as scheduled next week.will adjust medication once Hgb A1C obtained.

## 2017-12-02 ENCOUNTER — Other Ambulatory Visit: Payer: Self-pay | Admitting: *Deleted

## 2017-12-02 MED ORDER — GABAPENTIN 300 MG PO CAPS: 300.0000 mg | ORAL_CAPSULE | Freq: Two times a day (BID) | ORAL | 1 refills | Status: DC

## 2017-12-02 NOTE — Telephone Encounter (Signed)
[facility]

## 2017-12-08 ENCOUNTER — Other Ambulatory Visit: Payer: Medicare Other

## 2017-12-08 DIAGNOSIS — E1129 Type 2 diabetes mellitus with other diabetic kidney complication: Secondary | ICD-10-CM | POA: Diagnosis not present

## 2017-12-08 DIAGNOSIS — N182 Chronic kidney disease, stage 2 (mild): Secondary | ICD-10-CM | POA: Diagnosis not present

## 2017-12-08 DIAGNOSIS — I10 Essential (primary) hypertension: Secondary | ICD-10-CM

## 2017-12-08 LAB — BASIC METABOLIC PANEL
BUN/Creatinine Ratio: 13 (calc) (ref 6–22)
BUN: 19 mg/dL (ref 7–25)
CO2: 26 mmol/L (ref 20–32)
Calcium: 9.5 mg/dL (ref 8.6–10.4)
Chloride: 103 mmol/L (ref 98–110)
Creat: 1.47 mg/dL — ABNORMAL HIGH (ref 0.60–0.88)
Glucose, Bld: 137 mg/dL — ABNORMAL HIGH (ref 65–99)
Potassium: 4.7 mmol/L (ref 3.5–5.3)
Sodium: 136 mmol/L (ref 135–146)

## 2017-12-08 LAB — CBC WITH DIFFERENTIAL/PLATELET
Basophils Absolute: 53 cells/uL (ref 0–200)
Basophils Relative: 0.6 %
Eosinophils Absolute: 151 cells/uL (ref 15–500)
Eosinophils Relative: 1.7 %
HCT: 37.6 % (ref 35.0–45.0)
Hemoglobin: 12.1 g/dL (ref 11.7–15.5)
Lymphs Abs: 1477 cells/uL (ref 850–3900)
MCH: 28.9 pg (ref 27.0–33.0)
MCHC: 32.2 g/dL (ref 32.0–36.0)
MCV: 90 fL (ref 80.0–100.0)
MPV: 12.5 fL (ref 7.5–12.5)
Monocytes Relative: 9.6 %
Neutro Abs: 6364 cells/uL (ref 1500–7800)
Neutrophils Relative %: 71.5 %
Platelets: 202 10*3/uL (ref 140–400)
RBC: 4.18 10*6/uL (ref 3.80–5.10)
RDW: 13.6 % (ref 11.0–15.0)
Total Lymphocyte: 16.6 %
WBC mixed population: 854 cells/uL (ref 200–950)
WBC: 8.9 10*3/uL (ref 3.8–10.8)

## 2017-12-08 LAB — HEMOGLOBIN A1C
Hgb A1c MFr Bld: 7.7 % of total Hgb — ABNORMAL HIGH (ref <5.7)
Mean Plasma Glucose: 174 (calc)
eAG (mmol/L): 9.7 (calc)

## 2017-12-09 DIAGNOSIS — D485 Neoplasm of uncertain behavior of skin: Secondary | ICD-10-CM | POA: Diagnosis not present

## 2017-12-09 DIAGNOSIS — L439 Lichen planus, unspecified: Secondary | ICD-10-CM | POA: Diagnosis not present

## 2017-12-15 ENCOUNTER — Ambulatory Visit (INDEPENDENT_AMBULATORY_CARE_PROVIDER_SITE_OTHER): Payer: Medicare Other | Admitting: Internal Medicine

## 2017-12-15 ENCOUNTER — Ambulatory Visit (INDEPENDENT_AMBULATORY_CARE_PROVIDER_SITE_OTHER): Payer: Medicare Other | Admitting: *Deleted

## 2017-12-15 ENCOUNTER — Ambulatory Visit
Admission: RE | Admit: 2017-12-15 | Discharge: 2017-12-15 | Disposition: A | Discharge status: Home / Self Care | Payer: Medicare Other | Source: Ambulatory Visit | Attending: Internal Medicine | Admitting: Internal Medicine

## 2017-12-15 ENCOUNTER — Encounter: Payer: Self-pay | Admitting: Internal Medicine

## 2017-12-15 VITALS — BP 130/60 | HR 56 | Temp 98.4°F | Ht 66.0 in | Wt 175.0 lb

## 2017-12-15 DIAGNOSIS — I639 Cerebral infarction, unspecified: Secondary | ICD-10-CM

## 2017-12-15 DIAGNOSIS — E1129 Type 2 diabetes mellitus with other diabetic kidney complication: Secondary | ICD-10-CM | POA: Diagnosis not present

## 2017-12-15 DIAGNOSIS — Z23 Encounter for immunization: Secondary | ICD-10-CM | POA: Diagnosis not present

## 2017-12-15 DIAGNOSIS — I726 Aneurysm of vertebral artery: Secondary | ICD-10-CM | POA: Diagnosis not present

## 2017-12-15 DIAGNOSIS — H6121 Impacted cerumen, right ear: Secondary | ICD-10-CM | POA: Diagnosis not present

## 2017-12-15 DIAGNOSIS — N182 Chronic kidney disease, stage 2 (mild): Secondary | ICD-10-CM | POA: Diagnosis not present

## 2017-12-15 DIAGNOSIS — R05 Cough: Secondary | ICD-10-CM

## 2017-12-15 DIAGNOSIS — R195 Other fecal abnormalities: Secondary | ICD-10-CM

## 2017-12-15 DIAGNOSIS — Z87891 Personal history of nicotine dependence: Secondary | ICD-10-CM | POA: Diagnosis not present

## 2017-12-15 DIAGNOSIS — R059 Cough, unspecified: Secondary | ICD-10-CM

## 2017-12-15 DIAGNOSIS — I480 Paroxysmal atrial fibrillation: Secondary | ICD-10-CM

## 2017-12-15 MED ORDER — ZOSTER VAC RECOMB ADJUVANTED 50 MCG/0.5ML IM SUSR: 0.5000 mL | Freq: Once | INTRAMUSCULAR | 1 refills | Status: AC

## 2017-12-15 MED ORDER — PIOGLITAZONE HCL 15 MG PO TABS: 15.0000 mg | ORAL_TABLET | Freq: Every day | ORAL | 0 refills | Status: DC

## 2017-12-15 NOTE — Progress Notes (Signed)
Location:  [facility] clinic Provider:  Tashanti Dalporto L. Mariea Clonts, D.O., C.M.D.  Code Status: full code Goals of Care:  Advanced Directives 11/27/2017  Does Patient Have a Medical Advance Directive? No  Type of Advance Directive -  Does patient want to make changes to medical advance directive? -  Copy of [place] in Chart? -  Would patient like information on creating a medical advance directive? -   Chief Complaint  Patient presents with  . Medical Management of Chronic Issues    84mth follow-up    HPI: Patient is a 84 y.o. female seen today for medical management of chronic diseases.    DMII:   She's holding her jardiance due to diarrhea.   She was started on 15mg  actos in April.   In May, she was calling saying she struggled with Jardiance and had hypoglycemia.  I had stopped the actos then.  She was educated then to eat three balanced meals.  Was still getting sick from [place] and wanted to take it bid instead of all together.  Jardiance was increased to 25mg  from 10mg  at once due to uncontrolled sugars (6/3). Then she came in for her labs and said she still had diarrhea from jardiance so I told her to hold it.  Dinah saw her and increased her actos to 30mg  daily, but she never took it. She does not think she ever took the actos even in the beginning.    She was having a bm just about every time she went to urinate.   She still has some loose stool despite quitting the jardiance.   She was itching and not feeling good.  She was getting ready for bed and brushing her teeth.  Her left jaw felt funny. She said it hurt to brush her teeth and her ear hurt to touch and over the jaw.  She went to bed and got up the next morning and it was normal.  No more trouble since. She brought her readings with her.  Sugar was rarely over 200 with jardiance and after she stopped it, it didn't often either.  No lows in august  She is aware she's having more difficulty remembering.    She  saw Dr. Eulas Post for bronchitis on 7/3 and was treated with azithromycin, prednisone taper, tessalon and albuterol inhaler. At night when she goes to bed, she still has cough. She's been using albuterol before bed.  Her congestion did resolve, but she's hoarse again which happens after she talks a while and ever since that bout.  At night, she gets coughing to where she sounds like she has pneumonia.  She may spit up a little mucus in the morning, but then she is fine throughout the day.    Had never wheezed.  Has had some difficulty ever since the stroke.  Has a mild problem with swallowing--has to be more careful.  If she does anything strenuous like cleaning the bathtub bend over, she will be sob.  No difficulty doing her ordinary stuff.  Walks to [employer], takes out the trash, mows the yard on a riding mower.  Cannot use the push mower anymore.    Not having reflux like she did at one point.  Has some rhinitis, but not congested.   Probiotic did not help her loose stools before.    Past Medical History:  Diagnosis Date  . Allergic rhinitis, cause unspecified   . Anxiety state, unspecified   . Diaphragmatic hernia without  mention of obstruction or gangrene   . Diverticulosis of colon (without mention of hemorrhage)   . Female stress incontinence   . Insomnia, unspecified   . Interstitial cystitis    surgery 02/01/2013  . Irritable bowel syndrome   . Memory loss   . Other and unspecified hyperlipidemia   . Other premature beats   . Polyneuropathy in diabetes(357.2)   . Postmenopausal bleeding   . Proteinuria   . Stroke ([place])   . 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 Unspecified essential hypertension   . Viral warts, unspecified     Past Surgical History:  Procedure Laterality Date  . ABDOMINAL HYSTERECTOMY  1977   Dr.Hambright  . APPENDECTOMY  1965  . CATARACT EXTRACTION, BILATERAL  02/10/2006    Dr.Groat   . COLONOSCOPY  07/17/2004   polypectomy  . [place]  . LOOP RECORDER INSERTION N/A 07/31/2016   Procedure: Loop Recorder Insertion;  Surgeon: [name], MD;  Location: [facility];  Service: Cardiovascular;  Laterality: N/A;  . TEE WITHOUT CARDIOVERSION N/A 07/30/2016   Procedure: TRANSESOPHAGEAL ECHOCARDIOGRAM (TEE);  Surgeon: [name], MD;  Location: [facility];  Service: Cardiovascular;  Laterality: N/A;  . [place]  . TRIGGER FINGER RELEASE  05/13/16  . urinary bladder      surgery 02/01/2013 to stretch bladder stem    Allergies  Allergen Reactions  . Metformin And Related Diarrhea  . Ceclor [Cefaclor] Other (See Comments)    Unknown reaction; not recalled  . Codeine Nausea And Vomiting  . Lopid [Gemfibrozil] Other (See Comments)    Unknown reaction; not recalled  . Metoprolol Nausea And Vomiting  . Morphine And Related Other (See Comments)    Passed out  . Niacin And Related Other (See Comments)    Unknown reaction; not recalled  . Relafen [Nabumetone] Nausea And Vomiting  . Septra [Sulfamethoxazole-Trimethoprim] Nausea And Vomiting  . Tetracycline Nausea And Vomiting  . Toprol Xl [Metoprolol Tartrate] Nausea And Vomiting  . Adhesive [Tape] Rash and Other (See Comments)    Tears the skin also    Outpatient Encounter Medications as of 12/15/2017  Medication Sig  . acetaminophen (TYLENOL) 325 MG tablet Take 325-650 mg by mouth daily as needed (for headaches).   Marland Kitchen albuterol (PROVENTIL HFA;VENTOLIN HFA) 108 (90 Base) MCG/ACT inhaler Inhale 2 puffs into the lungs every 6 (six) hours as needed for wheezing or shortness of breath.  Marland Kitchen amLODipine (NORVASC) 2.5 MG tablet Take one tablet twice daily for blood pressure  . apixaban (ELIQUIS) 5 MG TABS tablet Take 1 tablet (5 mg total) by mouth 2 (two) times daily.  Marland Kitchen atenolol (TENORMIN) 50 MG tablet Take one tablet by mouth once daily to help blood  pressure  . atorvastatin (LIPITOR) 40 MG tablet Take 1 tablet (40 mg total) by mouth daily at 6 PM.  . calcium carbonate (TUMS EX) 750 MG chewable tablet Chew 1 tablet by mouth daily as needed for heartburn.   . citalopram (CELEXA) 10 MG tablet Take 1 tablet (10 mg total) by mouth daily.  . empagliflozin (JARDIANCE) 25 MG TABS tablet Take 25 mg by mouth daily. To control blood sugar  . gabapentin (NEURONTIN) 300 MG capsule Take 1 capsule (300 mg total) by mouth 2 (two) times daily.  Marland Kitchen glucose blood (ACCU-CHEK AVIVA PLUS) test strip Use to test blood sugar twice  daily. Dx: E11.29  . Lancets (ACCU-CHEK MULTICLIX) lancets Use to test blood sugar twice daily. E11.29  . meclizine (ANTIVERT) 25 MG tablet Take 1 tablet (25 mg total) by mouth 3 (three) times daily as needed for dizziness.  . pioglitazone (ACTOS) 30 MG tablet Take 1 tablet (30 mg total) by mouth daily.  . Vitamin D, Ergocalciferol, (DRISDOL) 50000 units CAPS capsule TAKE 1 CAPSULE WEEKLY FOR  VITAMIN D   No facility-administered encounter medications on file as of 12/15/2017.     Review of Systems:  Review of Systems  Constitutional: Negative for chills and fever.  HENT: Positive for hearing loss. Negative for congestion.   Eyes: Negative for blurred vision.       Glasses  Respiratory: Negative for cough and shortness of breath.   Cardiovascular: Negative for chest pain, palpitations and leg swelling.  Gastrointestinal: Positive for diarrhea. Negative for abdominal pain, blood in stool, constipation and melena.       Reports generalized abdominal tenderness all the time  Musculoskeletal: Negative for falls and joint pain.       Uses cane for balance since stroke  Neurological: Negative for dizziness and loss of consciousness.  Psychiatric/Behavioral: Positive for memory loss.    Health Maintenance  Topic Date Due  . FOOT EXAM  06/22/2015  . PNA vac Low Risk Adult (2 of 2 - PPSV23) 05/07/2017  . INFLUENZA VACCINE  10/30/2017    . URINE MICROALBUMIN  11/05/2017  . HEMOGLOBIN A1C  06/08/2018  . TETANUS/TDAP  09/15/2018  . OPHTHALMOLOGY EXAM  09/20/2018  . DEXA SCAN  Completed    Physical Exam: Vitals:   12/15/17 1018  BP: 130/60  Pulse: (!) 56  Temp: 98.4 F (36.9 C)  TempSrc: Oral  SpO2: 96%  Weight: 175 lb (79.4 kg)  Height: 5\' 6"  (1.676 m)   Body mass index is 28.25 kg/m. Physical Exam  Constitutional: She is oriented to person, place, and time. She appears well-developed and well-nourished. No distress.  HENT:  Head: Normocephalic and atraumatic.  Right cerumen impaction, left clear with pink TM and light reflex  Eyes:  glasses  Cardiovascular: Normal rate, regular rhythm, normal heart sounds and intact distal pulses.  Pulmonary/Chest: Effort normal. No respiratory distress. She has wheezes.  End expiratory wheezes  Abdominal: Soft. Bowel sounds are normal. She exhibits no distension and no mass. There is no tenderness. There is no rebound and no guarding.  Neurological: She is alert and oriented to person, place, and time.  Skin: Capillary refill takes less than 2 seconds. There is pallor.  Psychiatric: She has a normal mood and affect.    Labs reviewed: Basic Metabolic Panel: Recent Labs    06/15/17 1631 07/17/17 0844 12/08/17 0909  NA 136 137 136  K 3.8 4.6 4.7  CL 107 104 103  CO2 20* 27 26  GLUCOSE 91 115* 137*  BUN 14 24 19   CREATININE 1.09* 1.59* 1.47*  CALCIUM 8.4* 9.6 9.5   Liver Function Tests: Recent Labs    03/17/17 0920 05/12/17 1435  AST 14 11  ALT 10 10  BILITOT 0.5 0.4  PROT 6.3 6.6   Recent Labs    05/12/17 1435  LIPASE 70*  AMYLASE 33   No results for input(s): AMMONIA in the last 8760 hours. CBC: Recent Labs    05/12/17 1435 06/15/17 1631 07/17/17 0844 12/08/17 0909  WBC 9.6 7.9 8.7 8.9  NEUTROABS 6,317  --  6,308 6,364  HGB 13.2 10.2* 12.9 12.1  HCT 38.5 30.9* 38.2 37.6  MCV 91.2 96.6 92.0 90.0  PLT 178 166 178 202   Lipid  Panel: Recent Labs    03/17/17 0920  CHOL 135  HDL 40*  LDLCALC 69  TRIG 179*  CHOLHDL 3.4   Lab Results  Component Value Date   HGBA1C 7.7 (H) 12/08/2017    Assessment/Plan 1. Aneurysm of vertebral artery (HCC) -noted when pt had her stroke  2. Paroxysmal atrial fibrillation (HCC) -cont eliquis therapy, rate is controlled with atenolol  3. 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  Dispense: 30 tablet; Refill: 0 -pt reports never taking any of the actos before - CBC with Differential/Platelet; Future - Basic metabolic panel; Future - Hemoglobin A1c; Future - Microalbumin / creatinine urine ratio  4. Loose stools -ongoing despite stopping metformin and so far since stopping jardiance therapy -pt to keep diary of loose stools and call back in a week if they are not resolving so GI referral can be placed  5. History of tobacco abuse -long ago, has what now seems to be some degree of COPD -will check chest xray due to this history, coughing and wheezing on exam  6. Chronic renal disease, stage II -avoid nsaids, hydrated, monitor  7. Cough - suspect mild COPD -use albuterol faithfully at hs - DG Chest 2 View  8. Need for influenza vaccination -flu shot given  9. Hearing loss of right ear due to cerumen impaction - Right ear flushed, but large chunk of cerumen had to be extracted with the metal tweezers and plastic loop by me.  10. Need for shingles vaccine - Zoster Vaccine Adjuvanted [facility]) injection; Inject 0.5 mLs into the muscle once for 1 dose.  Dispense: 0.5 mL; Refill: 1  Labs/tests ordered:  Orders Placed This Encounter  Procedures  . DG Chest 2 View    Order Specific Question:   Reason for Exam (SYMPTOM  OR DIAGNOSIS REQUIRED)    Answer:   cough, wheezing, h/o tobacco abuse    Order Specific Question:    Preferred imaging location?    Answer:   [facility]  . Flu vaccine HIGH DOSE PF (Fluzone High dose)  . CBC with Differential/Platelet    Standing Status:   Future    Standing Expiration Date:   12/16/2018  . Basic metabolic panel    Standing Status:   Future    Standing Expiration Date:   12/16/2018    Order Specific Question:   Has the patient fasted?    Answer:   Yes  . Hemoglobin A1c    Standing Status:   Future    Standing Expiration Date:   12/16/2018  . Microalbumin / creatinine urine ratio    Next appt:  4 mos med mgt, fasting labs before  Patient instructions:  Pick up Actos 15mg  daily.  Be sure to eat three well-balanced meals each day. Call me if you get low sugars. If your sugars are over 200 more than 2 times per week, also call because we may need to increase your dose to 30mg  of actos. Call me if your ankles swell or you get short of breath.  Go get a chest xray at [place] (walk-in, no appointment needed).   Start to use the albuterol 2 puffs faithfully each night.  Track your loose BMs.  If they remain  an issue by next week, bring the record by the office for me to look at and we'll figure out next steps.  Fredrika Canby L. Tamara Kenyon, D.O. [facility] [address], [place] [address] Cell Phone (Mon-Fri 8am-5pm):  [phone] On Call:  [phone] & follow prompts after 5pm & weekends Office Phone:  [phone] Office Fax:  [phone]

## 2017-12-15 NOTE — Patient Instructions (Signed)
Pick up Actos 15mg  daily.  Be sure to eat three well-balanced meals each day. Call me if you get low sugars. If your sugars are over 200 more than 2 times per week, also call because we may need to increase your dose to 30mg  of actos. Call me if your ankles swell or you get short of breath.  Go get a chest xray at [facility] (walk-in, no appointment needed).   Start to use the albuterol 2 puffs faithfully each night.  Track your loose BMs.  If they remain an issue by next week, bring the record by the office for me to look at and we'll figure out next steps.

## 2017-12-15 NOTE — Progress Notes (Signed)
Carelink Summary Report / Loop Recorder 

## 2017-12-16 ENCOUNTER — Other Ambulatory Visit: Payer: Medicare Other

## 2017-12-16 DIAGNOSIS — E1129 Type 2 diabetes mellitus with other diabetic kidney complication: Secondary | ICD-10-CM | POA: Diagnosis not present

## 2017-12-17 LAB — MICROALBUMIN / CREATININE URINE RATIO
Creatinine, Urine: 43 mg/dL (ref 20–275)
Microalb Creat Ratio: 28 mcg/mg creat (ref <30)
Microalb, Ur: 1.2 mg/dL

## 2017-12-18 LAB — CUP PACEART REMOTE DEVICE CHECK
Date Time Interrogation Session: 20190814023536
MDC IDC PG IMPLANT DT: 20180502

## 2017-12-19 ENCOUNTER — Encounter: Payer: Self-pay | Admitting: *Deleted

## 2017-12-28 LAB — CUP PACEART REMOTE DEVICE CHECK
Implantable Pulse Generator Implant Date: 20180502
MDC IDC SESS DTM: 20190916104037

## 2018-01-06 ENCOUNTER — Telehealth: Payer: Self-pay | Admitting: *Deleted

## 2018-01-06 NOTE — Telephone Encounter (Signed)
Patient called and stated that you wanted her to call back with BM update. Patient stated that when the Jardiance got out of her system the BM started to get better and less. Stated that they are more firm and only going about twice a day instead of loose and 5 times daily.   Also patient stated that since the ear wax was removed her ear has been hurting and painful. Unable to rest well at night due to it hurting. Stated that this happened one time in the hospital and they had to give her Doxycycline. Stated that it cleared it up. Patient is requesting a rx for it. Please Advise.

## 2018-01-07 NOTE — Telephone Encounter (Signed)
Patient notified and agreed. Appointment scheduled for tomorrow.

## 2018-01-07 NOTE — Telephone Encounter (Signed)
Glad her bowels are better. She needs her ear looked at to check for infection. Will need an acute visit appt.

## 2018-01-08 ENCOUNTER — Ambulatory Visit (INDEPENDENT_AMBULATORY_CARE_PROVIDER_SITE_OTHER): Payer: Medicare Other | Admitting: Internal Medicine

## 2018-01-08 ENCOUNTER — Encounter: Payer: Self-pay | Admitting: Internal Medicine

## 2018-01-08 VITALS — BP 130/80 | HR 54 | Temp 97.8°F | Ht 66.0 in | Wt 180.0 lb

## 2018-01-08 DIAGNOSIS — E1129 Type 2 diabetes mellitus with other diabetic kidney complication: Secondary | ICD-10-CM

## 2018-01-08 DIAGNOSIS — J312 Chronic pharyngitis: Secondary | ICD-10-CM

## 2018-01-08 DIAGNOSIS — I639 Cerebral infarction, unspecified: Secondary | ICD-10-CM | POA: Diagnosis not present

## 2018-01-08 DIAGNOSIS — R195 Other fecal abnormalities: Secondary | ICD-10-CM | POA: Diagnosis not present

## 2018-01-08 MED ORDER — DOXYCYCLINE HYCLATE 100 MG PO TABS: 100.0000 mg | ORAL_TABLET | Freq: Two times a day (BID) | ORAL | 0 refills | Status: DC

## 2018-01-08 MED ORDER — PIOGLITAZONE HCL 15 MG PO TABS: 15.0000 mg | ORAL_TABLET | Freq: Every day | ORAL | 3 refills | Status: DC

## 2018-01-08 NOTE — Patient Instructions (Signed)

## 2018-01-08 NOTE — Progress Notes (Signed)
Location:  [facility] clinic Provider: Tiya Schrupp L. Mariea Clonts, D.O., C.M.D.  Goals of Care:  Advanced Directives 11/27/2017  Does Patient Have a Medical Advance Directive? No  Type of Advance Directive -  Does patient want to make changes to medical advance directive? -  Copy of [place] in Chart? -  Would patient like information on creating a medical advance directive? -    Chief Complaint  Patient presents with  . Acute Visit    ear pain    HPI: Patient is a 84 y.o. [place] seen today for an acute visit for ear pain after having her ears flushed.  She's also had sore throat on the right side and now some soreness of her left ear.  Has had postnasal drip with cough.    DMII Sugar is 135 in the morning, then evening 135-150 also.  Highest was 175 at night.  Stomach is much much better.    Past Medical History:  Diagnosis Date  . Allergic rhinitis, cause unspecified   . Anxiety state, unspecified   . Diaphragmatic hernia without mention of obstruction or gangrene   . Diverticulosis of colon (without mention of hemorrhage)   . [place] stress incontinence   . Insomnia, unspecified   . Interstitial cystitis    surgery 02/01/2013  . Irritable bowel syndrome   . Memory loss   . Other and unspecified hyperlipidemia   . Other premature beats   . Polyneuropathy in diabetes(357.2)   . Postmenopausal bleeding   . Proteinuria   . Stroke ([place])   . 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 Unspecified essential hypertension   . Viral warts, unspecified     Past Surgical History:  Procedure Laterality Date  . ABDOMINAL HYSTERECTOMY  1977   Dr.Hambright  . APPENDECTOMY  1965  . CATARACT EXTRACTION, BILATERAL  11/Penny/2007   Dr.Groat   . COLONOSCOPY  07/17/2004   polypectomy  . [place]  . LOOP RECORDER INSERTION N/A 07/31/2016   Procedure: Loop Recorder Insertion;  Surgeon:  [name], MD;  Location: [place] [facility];  Service: Cardiovascular;  Laterality: N/A;  . TEE WITHOUT CARDIOVERSION N/A 07/30/2016   Procedure: TRANSESOPHAGEAL ECHOCARDIOGRAM (TEE);  Surgeon: [name], MD;  Location: [facility];  Service: Cardiovascular;  Laterality: N/A;  . [place]  . TRIGGER FINGER RELEASE  2/Penny/18  . urinary bladder      surgery 02/01/2013 to stretch bladder stem    Allergies  Allergen Reactions  . Jardiance [Empagliflozin] Diarrhea  . Metformin And Related Diarrhea  . Ceclor [Cefaclor] Other (See Comments)    Unknown reaction; not recalled  . Codeine Nausea And Vomiting  . Lopid [Gemfibrozil] Other (See Comments)    Unknown reaction; not recalled  . Metoprolol Nausea And Vomiting  . Morphine And Related Other (See Comments)    Passed out  . Niacin And Related Other (See Comments)    Unknown reaction; not recalled  . Relafen [Nabumetone] Nausea And Vomiting  . Septra [Sulfamethoxazole-Trimethoprim] Nausea And Vomiting  . Tetracycline Nausea And Vomiting  . Toprol Xl [Metoprolol Tartrate] Nausea And Vomiting  . Adhesive [Tape] Rash and Other (See Comments)    Tears the skin also    Outpatient Encounter Medications as of 01/08/2018  Medication Sig  . acetaminophen (TYLENOL) 325 MG tablet Take 325-650 mg by mouth daily as needed (for  headaches).   Marland Kitchen albuterol (PROVENTIL HFA;VENTOLIN HFA) 108 (90 Base) MCG/ACT inhaler Inhale 2 puffs into the lungs every 6 (six) hours as needed for wheezing or shortness of breath.  Marland Kitchen amLODipine (NORVASC) 2.5 MG tablet Take one tablet twice daily for blood pressure  . apixaban (ELIQUIS) 5 MG TABS tablet Take 1 tablet (5 mg total) by mouth 2 (two) times daily.  Marland Kitchen atenolol (TENORMIN) 50 MG tablet Take one tablet by mouth once daily to help blood pressure  . atorvastatin (LIPITOR) 40 MG tablet Take 1 tablet (40 mg total) by mouth daily at 6 PM.  . calcium carbonate (TUMS EX) 750 MG  chewable tablet Chew 1 tablet by mouth daily as needed for heartburn.   . citalopram (CELEXA) 10 MG tablet Take 1 tablet (10 mg total) by mouth daily.  Marland Kitchen gabapentin (NEURONTIN) 300 MG capsule Take 1 capsule (300 mg total) by mouth 2 (two) times daily.  Marland Kitchen glucose blood (ACCU-CHEK AVIVA PLUS) test strip Use to test blood sugar twice daily. Dx: E11.29  . Lancets (ACCU-CHEK MULTICLIX) lancets Use to test blood sugar twice daily. E11.29  . meclizine (ANTIVERT) 25 MG tablet Take 1 tablet (25 mg total) by mouth 3 (three) times daily as needed for dizziness.  . pioglitazone (ACTOS) 15 MG tablet Take 1 tablet (15 mg total) by mouth daily.  . Vitamin D, Ergocalciferol, (DRISDOL) 50000 units CAPS capsule TAKE 1 CAPSULE WEEKLY FOR  VITAMIN D   No facility-administered encounter medications on file as of 01/08/2018.     Review of Systems:  Review of Systems  Constitutional: Negative for chills, fever and malaise/fatigue.  HENT: Positive for congestion, ear pain and sore throat. Negative for ear discharge, nosebleeds, sinus pain and tinnitus.   Eyes: Negative for blurred vision.  Respiratory: Positive for cough. Negative for shortness of breath.   Cardiovascular: Negative for chest pain, palpitations and leg swelling.  Gastrointestinal: Negative for abdominal pain.  Genitourinary: Negative for dysuria.  Musculoskeletal: Negative for falls and joint pain.  Neurological: Negative for dizziness.  Psychiatric/Behavioral: Positive for memory loss.    Health Maintenance  Topic Date Due  . PNA vac Low Risk Adult (2 of 2 - PPSV23) 05/07/2017  . HEMOGLOBIN A1C  06/08/2018  . TETANUS/TDAP  09/15/2018  . OPHTHALMOLOGY EXAM  09/20/2018  . FOOT EXAM  12/16/2018  . URINE MICROALBUMIN  12/17/2018  . INFLUENZA VACCINE  Completed  . DEXA SCAN  Completed    Physical Exam: Vitals:   01/08/18 1504  BP: 130/80  Pulse: (!) 54  Temp: 97.8 F (36.6 C)  TempSrc: Oral  SpO2: 95%  Weight: 180 lb (81.6 kg)    Height: 5\' 6"  (1.676 m)   Body mass index is 29.05 kg/m. Physical Exam  Constitutional: She is oriented to person, place, and time. She appears well-developed and well-nourished. No distress.  HENT:  Right Ear: External ear normal.  Left Ear: External ear normal.  Nose: Nose normal.  Postnasal drip; TMs pink   Neck: Neck supple.  Right side of neck tender beneath her ear  Cardiovascular: Normal rate, regular rhythm, normal heart sounds and intact distal pulses.  Pulmonary/Chest: Effort normal and breath sounds normal. No respiratory distress.  Musculoskeletal: Normal range of motion.  Neurological: She is alert and oriented to person, place, and time.  Skin: Skin is warm and dry.  Psychiatric: She has a normal mood and affect.    Labs reviewed: Basic Metabolic Panel: Recent Labs    06/15/17 1631 07/17/17  4497 12/08/17 0909  NA 136 137 136  K 3.8 4.6 4.7  CL 107 104 103  CO2 20* 27 26  GLUCOSE 91 115* 137*  BUN 14 24 19   CREATININE 1.09* 1.59* 1.47*  CALCIUM 8.4* 9.6 9.5   Liver Function Tests: Recent Labs    Penny/17/18 0920 05/12/17 1435  AST 14 11  ALT 10 10  BILITOT 0.5 0.4  PROT 6.3 6.6   Recent Labs    05/12/17 1435  LIPASE 70*  AMYLASE 33   No results for input(s): AMMONIA in the last 8760 hours. CBC: Recent Labs    05/12/17 1435 06/15/17 1631 07/17/17 0844 12/08/17 0909  WBC 9.6 7.9 8.7 8.9  NEUTROABS 6,317  --  6,308 6,364  HGB 13.2 10.2* Penny.9 Penny.1  HCT 38.5 30.9* 38.2 37.6  MCV 91.2 96.6 92.0 90.0  PLT 178 166 178 202   Lipid Panel: Recent Labs    Penny/17/18 0920  CHOL 135  HDL 40*  LDLCALC 69  TRIG 179*  CHOLHDL 3.4   Lab Results  Component Value Date   HGBA1C 7.7 (H) 12/08/2017    Procedures since last visit: Dg Chest 2 View  Result Date: 12/15/2017 CLINICAL DATA:  Cough and wheezing. EXAM: CHEST - 2 VIEW COMPARISON:  Two-view chest x-ray 06/15/2017 FINDINGS: The heart size is upper limits of normal. Atherosclerotic changes  are present at the aortic arch. Lungs are clear. There is no edema or effusion. Subcutaneous monitor is in place. The visualized soft tissues and bony thorax are unremarkable. IMPRESSION: 1. Borderline cardiomegaly without failure. 2. No acute cardiopulmonary disease. Electronically Signed   By: San Morelle M.D.   On: 12/15/2017 16:28    Assessment/Plan 1. Pharyngitis, chronic - reports drainage from ear and pain in ear as well as beneath it, postnasal drip--notes that doxy solved this before  - doxycycline (VIBRA-TABS) 100 MG tablet; Take 1 tablet (100 mg total) by mouth 2 (two) times daily.  Dispense: 20 tablet; Refill: 0  2. 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  Dispense: 90 tablet; Refill: 3  3. Loose stools -cont off metformin and jardiance which caused this  Labs/tests ordered:  No orders of the defined types were placed in this encounter.  Next appt:  04/13/2018  Lynzie Cliburn L. Armani Gawlik, D.O. [facility] [address], [place] [address] Cell Phone (Mon-Fri 8am-5pm):  [phone] On Call:  [phone] & follow prompts after 5pm & weekends Office Phone:  [phone] Office Fax:  [phone]

## 2018-01-19 ENCOUNTER — Ambulatory Visit (INDEPENDENT_AMBULATORY_CARE_PROVIDER_SITE_OTHER): Payer: Medicare Other | Admitting: *Deleted

## 2018-01-19 DIAGNOSIS — I639 Cerebral infarction, unspecified: Secondary | ICD-10-CM | POA: Diagnosis not present

## 2018-01-19 NOTE — Progress Notes (Signed)
Carelink Summary Report / Loop Recorder 

## 2018-01-20 ENCOUNTER — Other Ambulatory Visit: Payer: Self-pay | Admitting: *Deleted

## 2018-01-20 MED ORDER — AMLODIPINE BESYLATE 2.5 MG PO TABS: ORAL_TABLET | ORAL | 3 refills | Status: DC

## 2018-01-20 NOTE — Telephone Encounter (Signed)
Patient Request Refill

## 2018-01-27 ENCOUNTER — Telehealth: Payer: Self-pay | Admitting: *Deleted

## 2018-01-27 NOTE — Telephone Encounter (Signed)
LVMOM regarding sending a manual transmission and inquiring about symptoms associated with pause episodes. [facility] phone number to call back.

## 2018-01-28 NOTE — Telephone Encounter (Signed)
LVMOM regarding receiving manual transmission and to discuss results. Gave [facility] phone number to call back.

## 2018-01-28 NOTE — Telephone Encounter (Signed)
Spoke with patient regarding symptoms associated with pause episodes. Patient reports asymptomatic and was asleep at the time of the episodes. Advised patient will place episodes in Dr. Tanna Furry folder for review. Patient verbalized understanding.

## 2018-01-28 NOTE — Telephone Encounter (Signed)
Pt was returning Lexi phone call. I told the patient that she will call her back as soon as she can.

## 2018-01-28 NOTE — Telephone Encounter (Signed)
Manual transmission received and reviewed. All pause episodes appear nocturnal, longest duration 3 seconds. AF burden 0.4%+ Eliquis, Avg V rate well controlled.

## 2018-01-28 NOTE — Telephone Encounter (Signed)
Spoke w/ pt and instructed her how to send a remote transmission with her home monitor.

## 2018-01-31 ENCOUNTER — Emergency Department (HOSPITAL_COMMUNITY): Payer: Medicare Other

## 2018-01-31 ENCOUNTER — Inpatient Hospital Stay (HOSPITAL_COMMUNITY)
Admission: EM | Admit: 2018-01-31 | Discharge: 2018-02-05 | DRG: 189 | Disposition: A | Discharge status: Home Health | Payer: Medicare Other | Attending: Internal Medicine | Admitting: Internal Medicine

## 2018-01-31 ENCOUNTER — Encounter (HOSPITAL_COMMUNITY): Payer: Self-pay | Admitting: *Deleted

## 2018-01-31 DIAGNOSIS — R001 Bradycardia, unspecified: Secondary | ICD-10-CM | POA: Diagnosis present

## 2018-01-31 DIAGNOSIS — I10 Essential (primary) hypertension: Secondary | ICD-10-CM | POA: Diagnosis not present

## 2018-01-31 DIAGNOSIS — T380X5A Adverse effect of glucocorticoids and synthetic analogues, initial encounter: Secondary | ICD-10-CM | POA: Diagnosis present

## 2018-01-31 DIAGNOSIS — Z90710 Acquired absence of both cervix and uterus: Secondary | ICD-10-CM | POA: Diagnosis not present

## 2018-01-31 DIAGNOSIS — E1142 Type 2 diabetes mellitus with diabetic polyneuropathy: Secondary | ICD-10-CM | POA: Diagnosis present

## 2018-01-31 DIAGNOSIS — J206 Acute bronchitis due to rhinovirus: Secondary | ICD-10-CM | POA: Diagnosis present

## 2018-01-31 DIAGNOSIS — Z833 Family history of diabetes mellitus: Secondary | ICD-10-CM | POA: Diagnosis not present

## 2018-01-31 DIAGNOSIS — E785 Hyperlipidemia, unspecified: Secondary | ICD-10-CM | POA: Diagnosis present

## 2018-01-31 DIAGNOSIS — N183 Chronic kidney disease, stage 3 (moderate): Secondary | ICD-10-CM | POA: Diagnosis present

## 2018-01-31 DIAGNOSIS — I129 Hypertensive chronic kidney disease with stage 1 through stage 4 chronic kidney disease, or unspecified chronic kidney disease: Secondary | ICD-10-CM | POA: Diagnosis present

## 2018-01-31 DIAGNOSIS — E860 Dehydration: Secondary | ICD-10-CM | POA: Diagnosis present

## 2018-01-31 DIAGNOSIS — E1122 Type 2 diabetes mellitus with diabetic chronic kidney disease: Secondary | ICD-10-CM | POA: Diagnosis present

## 2018-01-31 DIAGNOSIS — R7989 Other specified abnormal findings of blood chemistry: Secondary | ICD-10-CM | POA: Diagnosis not present

## 2018-01-31 DIAGNOSIS — I272 Pulmonary hypertension, unspecified: Secondary | ICD-10-CM | POA: Diagnosis present

## 2018-01-31 DIAGNOSIS — Z743 Need for continuous supervision: Secondary | ICD-10-CM | POA: Diagnosis not present

## 2018-01-31 DIAGNOSIS — I361 Nonrheumatic tricuspid (valve) insufficiency: Secondary | ICD-10-CM | POA: Diagnosis not present

## 2018-01-31 DIAGNOSIS — R062 Wheezing: Secondary | ICD-10-CM | POA: Diagnosis not present

## 2018-01-31 DIAGNOSIS — Z8673 Personal history of transient ischemic attack (TIA), and cerebral infarction without residual deficits: Secondary | ICD-10-CM | POA: Diagnosis present

## 2018-01-31 DIAGNOSIS — F329 Major depressive disorder, single episode, unspecified: Secondary | ICD-10-CM | POA: Diagnosis present

## 2018-01-31 DIAGNOSIS — R059 Cough, unspecified: Secondary | ICD-10-CM

## 2018-01-31 DIAGNOSIS — I480 Paroxysmal atrial fibrillation: Secondary | ICD-10-CM | POA: Diagnosis present

## 2018-01-31 DIAGNOSIS — I482 Chronic atrial fibrillation, unspecified: Secondary | ICD-10-CM | POA: Diagnosis present

## 2018-01-31 DIAGNOSIS — I63442 Cerebral infarction due to embolism of left cerebellar artery: Secondary | ICD-10-CM

## 2018-01-31 DIAGNOSIS — Z8249 Family history of ischemic heart disease and other diseases of the circulatory system: Secondary | ICD-10-CM | POA: Diagnosis not present

## 2018-01-31 DIAGNOSIS — Z7901 Long term (current) use of anticoagulants: Secondary | ICD-10-CM | POA: Diagnosis not present

## 2018-01-31 DIAGNOSIS — R0602 Shortness of breath: Secondary | ICD-10-CM | POA: Diagnosis not present

## 2018-01-31 DIAGNOSIS — N179 Acute kidney failure, unspecified: Secondary | ICD-10-CM | POA: Diagnosis present

## 2018-01-31 DIAGNOSIS — J9601 Acute respiratory failure with hypoxia: Secondary | ICD-10-CM | POA: Diagnosis not present

## 2018-01-31 DIAGNOSIS — I340 Nonrheumatic mitral (valve) insufficiency: Secondary | ICD-10-CM | POA: Diagnosis not present

## 2018-01-31 DIAGNOSIS — R05 Cough: Secondary | ICD-10-CM | POA: Diagnosis not present

## 2018-01-31 DIAGNOSIS — E1165 Type 2 diabetes mellitus with hyperglycemia: Secondary | ICD-10-CM | POA: Diagnosis present

## 2018-01-31 DIAGNOSIS — Z79899 Other long term (current) drug therapy: Secondary | ICD-10-CM | POA: Diagnosis not present

## 2018-01-31 DIAGNOSIS — E119 Type 2 diabetes mellitus without complications: Secondary | ICD-10-CM | POA: Diagnosis not present

## 2018-01-31 DIAGNOSIS — Z7984 Long term (current) use of oral hypoglycemic drugs: Secondary | ICD-10-CM | POA: Diagnosis not present

## 2018-01-31 DIAGNOSIS — Z66 Do not resuscitate: Secondary | ICD-10-CM | POA: Diagnosis present

## 2018-01-31 DIAGNOSIS — B370 Candidal stomatitis: Secondary | ICD-10-CM | POA: Diagnosis present

## 2018-01-31 DIAGNOSIS — N2889 Other specified disorders of kidney and ureter: Secondary | ICD-10-CM | POA: Diagnosis not present

## 2018-01-31 DIAGNOSIS — F411 Generalized anxiety disorder: Secondary | ICD-10-CM | POA: Diagnosis present

## 2018-01-31 DIAGNOSIS — Z87891 Personal history of nicotine dependence: Secondary | ICD-10-CM

## 2018-01-31 DIAGNOSIS — R0902 Hypoxemia: Secondary | ICD-10-CM | POA: Diagnosis not present

## 2018-01-31 LAB — MRSA PCR SCREENING: MRSA BY PCR: NEGATIVE

## 2018-01-31 LAB — CBC WITH DIFFERENTIAL/PLATELET
Abs Immature Granulocytes: 0.08 10*3/uL — ABNORMAL HIGH (ref 0.00–0.07)
BASOS PCT: 0 %
Basophils Absolute: 0 10*3/uL (ref 0.0–0.1)
Eosinophils Absolute: 0 10*3/uL (ref 0.0–0.5)
Eosinophils Relative: 0 %
HCT: 40.7 % (ref 36.0–46.0)
Hemoglobin: 12.6 g/dL (ref 12.0–15.0)
Immature Granulocytes: 1 %
Lymphocytes Relative: 5 %
Lymphs Abs: 0.5 10*3/uL — ABNORMAL LOW (ref 0.7–4.0)
MCH: 29.2 pg (ref 26.0–34.0)
MCHC: 31 g/dL (ref 30.0–36.0)
MCV: 94.2 fL (ref 80.0–100.0)
MONOS PCT: 3 %
Monocytes Absolute: 0.3 10*3/uL (ref 0.1–1.0)
NEUTROS ABS: 8.6 10*3/uL — AB (ref 1.7–7.7)
Neutrophils Relative %: 91 %
PLATELETS: 170 10*3/uL (ref 150–400)
RBC: 4.32 MIL/uL (ref 3.87–5.11)
RDW: 16.6 % — ABNORMAL HIGH (ref 11.5–15.5)
WBC: 9.4 10*3/uL (ref 4.0–10.5)
nRBC: 0 % (ref 0.0–0.2)

## 2018-01-31 LAB — RESPIRATORY PANEL BY PCR
ADENOVIRUS-RVPPCR: NOT DETECTED
Bordetella pertussis: NOT DETECTED
CORONAVIRUS HKU1-RVPPCR: NOT DETECTED
CORONAVIRUS NL63-RVPPCR: NOT DETECTED
Chlamydophila pneumoniae: NOT DETECTED
Coronavirus 229E: NOT DETECTED
Coronavirus OC43: NOT DETECTED
INFLUENZA A-RVPPCR: NOT DETECTED
Influenza B: NOT DETECTED
Metapneumovirus: NOT DETECTED
Mycoplasma pneumoniae: NOT DETECTED
PARAINFLUENZA VIRUS 1-RVPPCR: NOT DETECTED
PARAINFLUENZA VIRUS 3-RVPPCR: NOT DETECTED
PARAINFLUENZA VIRUS 4-RVPPCR: NOT DETECTED
Parainfluenza Virus 2: NOT DETECTED
RHINOVIRUS / ENTEROVIRUS - RVPPCR: DETECTED — AB
Respiratory Syncytial Virus: NOT DETECTED

## 2018-01-31 LAB — URINALYSIS, ROUTINE W REFLEX MICROSCOPIC
Bilirubin Urine: NEGATIVE
Glucose, UA: NEGATIVE mg/dL
Ketones, ur: NEGATIVE mg/dL
LEUKOCYTES UA: NEGATIVE
Nitrite: NEGATIVE
Protein, ur: 30 mg/dL — AB
SPECIFIC GRAVITY, URINE: 1.012 (ref 1.005–1.030)
pH: 6 (ref 5.0–8.0)

## 2018-01-31 LAB — GLUCOSE, CAPILLARY: Glucose-Capillary: 414 mg/dL — ABNORMAL HIGH (ref 70–99)

## 2018-01-31 LAB — COMPREHENSIVE METABOLIC PANEL
ALBUMIN: 4.1 g/dL (ref 3.5–5.0)
ALT: 14 U/L (ref 0–44)
ANION GAP: 11 (ref 5–15)
AST: 18 U/L (ref 15–41)
Alkaline Phosphatase: 100 U/L (ref 38–126)
BUN: 18 mg/dL (ref 8–23)
CO2: 24 mmol/L (ref 22–32)
Calcium: 9.6 mg/dL (ref 8.9–10.3)
Chloride: 103 mmol/L (ref 98–111)
Creatinine, Ser: 1.27 mg/dL — ABNORMAL HIGH (ref 0.44–1.00)
GFR calc Af Amer: 44 mL/min — ABNORMAL LOW (ref >60)
GFR calc non Af Amer: 38 mL/min — ABNORMAL LOW (ref >60)
GLUCOSE: 174 mg/dL — AB (ref 70–99)
POTASSIUM: 3.3 mmol/L — AB (ref 3.5–5.1)
SODIUM: 138 mmol/L (ref 135–145)
TOTAL PROTEIN: 7.7 g/dL (ref 6.5–8.1)
Total Bilirubin: 0.7 mg/dL (ref 0.3–1.2)

## 2018-01-31 LAB — INFLUENZA PANEL BY PCR (TYPE A & B)
Influenza A By PCR: NEGATIVE
Influenza B By PCR: NEGATIVE

## 2018-01-31 LAB — TROPONIN I: Troponin I: 0.03 ng/mL (ref <0.03)

## 2018-01-31 LAB — I-STAT CG4 LACTIC ACID, ED: LACTIC ACID, VENOUS: 1.82 mmol/L (ref 0.5–1.9)

## 2018-01-31 MED ORDER — AMLODIPINE BESYLATE 5 MG PO TABS
2.5000 mg | ORAL_TABLET | Freq: Every day | ORAL | Status: DC
  Administered 2018-01-31 – 2018-02-05 (×5): 2.5 mg via ORAL
  Filled 2018-01-31 (×5): qty 1

## 2018-01-31 MED ORDER — INSULIN ASPART 100 UNIT/ML ~~LOC~~ SOLN
0.0000 [IU] | Freq: Three times a day (TID) | SUBCUTANEOUS | Status: DC
  Administered 2018-02-01 (×3): 2 [IU] via SUBCUTANEOUS
  Administered 2018-02-02: 1 [IU] via SUBCUTANEOUS
  Administered 2018-02-02: 2 [IU] via SUBCUTANEOUS
  Administered 2018-02-02: 5 [IU] via SUBCUTANEOUS
  Administered 2018-02-03: 2 [IU] via SUBCUTANEOUS
  Administered 2018-02-03: 5 [IU] via SUBCUTANEOUS
  Administered 2018-02-03: 2 [IU] via SUBCUTANEOUS
  Administered 2018-02-04: 7 [IU] via SUBCUTANEOUS
  Administered 2018-02-04 – 2018-02-05 (×2): 1 [IU] via SUBCUTANEOUS
  Administered 2018-02-05: 2 [IU] via SUBCUTANEOUS

## 2018-01-31 MED ORDER — CITALOPRAM HYDROBROMIDE 20 MG PO TABS
10.0000 mg | ORAL_TABLET | Freq: Every day | ORAL | Status: DC
  Administered 2018-01-31 – 2018-02-05 (×6): 10 mg via ORAL
  Filled 2018-01-31 (×6): qty 1

## 2018-01-31 MED ORDER — GUAIFENESIN-DM 100-10 MG/5ML PO SYRP
5.0000 mL | ORAL_SOLUTION | ORAL | Status: DC | PRN
  Administered 2018-01-31 – 2018-02-01 (×2): 5 mL via ORAL
  Filled 2018-01-31 (×2): qty 10

## 2018-01-31 MED ORDER — ATENOLOL 25 MG PO TABS
25.0000 mg | ORAL_TABLET | Freq: Two times a day (BID) | ORAL | Status: DC
  Administered 2018-01-31: 25 mg via ORAL
  Filled 2018-01-31 (×3): qty 1

## 2018-01-31 MED ORDER — ACETAMINOPHEN 650 MG RE SUPP: 650.0000 mg | Freq: Four times a day (QID) | RECTAL | Status: DC | PRN

## 2018-01-31 MED ORDER — AMLODIPINE BESYLATE 5 MG PO TABS: 2.5000 mg | ORAL_TABLET | Freq: Every day | ORAL | Status: DC

## 2018-01-31 MED ORDER — ONDANSETRON HCL 4 MG/2ML IJ SOLN: 4.0000 mg | Freq: Four times a day (QID) | INTRAMUSCULAR | Status: DC | PRN

## 2018-01-31 MED ORDER — ALBUTEROL SULFATE (2.5 MG/3ML) 0.083% IN NEBU: 5.0000 mg | INHALATION_SOLUTION | Freq: Once | RESPIRATORY_TRACT | Status: DC

## 2018-01-31 MED ORDER — OSELTAMIVIR PHOSPHATE 75 MG PO CAPS
75.0000 mg | ORAL_CAPSULE | Freq: Once | ORAL | Status: DC
  Filled 2018-01-31: qty 1

## 2018-01-31 MED ORDER — LEVOFLOXACIN IN D5W 500 MG/100ML IV SOLN
500.0000 mg | Freq: Once | INTRAVENOUS | Status: AC
  Administered 2018-01-31: 500 mg via INTRAVENOUS
  Filled 2018-01-31: qty 100

## 2018-01-31 MED ORDER — IPRATROPIUM-ALBUTEROL 0.5-2.5 (3) MG/3ML IN SOLN
3.0000 mL | Freq: Once | RESPIRATORY_TRACT | Status: AC
  Administered 2018-01-31: 3 mL via RESPIRATORY_TRACT
  Filled 2018-01-31: qty 3

## 2018-01-31 MED ORDER — GABAPENTIN 300 MG PO CAPS
300.0000 mg | ORAL_CAPSULE | Freq: Two times a day (BID) | ORAL | Status: DC
  Administered 2018-01-31 – 2018-02-05 (×10): 300 mg via ORAL
  Filled 2018-01-31 (×10): qty 1

## 2018-01-31 MED ORDER — METHYLPREDNISOLONE SODIUM SUCC 40 MG IJ SOLR
40.0000 mg | Freq: Every day | INTRAMUSCULAR | Status: DC
  Administered 2018-01-31 – 2018-02-05 (×6): 40 mg via INTRAVENOUS
  Filled 2018-01-31 (×6): qty 1

## 2018-01-31 MED ORDER — ATORVASTATIN CALCIUM 40 MG PO TABS
40.0000 mg | ORAL_TABLET | Freq: Every day | ORAL | Status: DC
  Administered 2018-02-01 – 2018-02-04 (×4): 40 mg via ORAL
  Filled 2018-01-31 (×4): qty 1

## 2018-01-31 MED ORDER — ACETAMINOPHEN 325 MG PO TABS
650.0000 mg | ORAL_TABLET | Freq: Four times a day (QID) | ORAL | Status: DC | PRN
  Filled 2018-01-31: qty 2

## 2018-01-31 MED ORDER — ALBUTEROL (5 MG/ML) CONTINUOUS INHALATION SOLN
10.0000 mg/h | INHALATION_SOLUTION | Freq: Once | RESPIRATORY_TRACT | Status: AC
  Administered 2018-01-31: 10 mg/h via RESPIRATORY_TRACT
  Filled 2018-01-31: qty 20

## 2018-01-31 MED ORDER — APIXABAN 5 MG PO TABS
5.0000 mg | ORAL_TABLET | Freq: Two times a day (BID) | ORAL | Status: DC
  Administered 2018-01-31 – 2018-02-01 (×2): 5 mg via ORAL
  Filled 2018-01-31 (×2): qty 1

## 2018-01-31 MED ORDER — INSULIN ASPART 100 UNIT/ML ~~LOC~~ SOLN
10.0000 [IU] | Freq: Once | SUBCUTANEOUS | Status: AC
  Administered 2018-01-31: 10 [IU] via SUBCUTANEOUS

## 2018-01-31 MED ORDER — ONDANSETRON HCL 4 MG PO TABS: 4.0000 mg | ORAL_TABLET | Freq: Four times a day (QID) | ORAL | Status: DC | PRN

## 2018-01-31 NOTE — ED Triage Notes (Addendum)
Per EMS, pt sent from doctor's office. Pt has had URI-likesymptoms x 3 days. Pt was given duoneb at PCP. Pt's was wheezing and O2 sats were 80% on room air upon EMS arrival. Pt was given duoneb and 125 solumedrol en route to hospital. EMS 12-lead unremarkable. Pt denies chest pain.   BP 160/70 HR 70 RR 24 SpO2 100% on 4L [place] CBG 143

## 2018-01-31 NOTE — ED Notes (Signed)
ED TO INPATIENT HANDOFF REPORT  Name/Age/Gender [name] 84 y.o. female  Code Status Code Status History    Date Active Date Inactive Code Status Order ID Comments User Context   07/29/2016 0158 07/31/2016 2259 Full Code [id number]  Karmen Bongo, MD Inpatient      Home/SNF/Other Home  Chief Complaint common cold; wheezing  Level of Care/Admitting Diagnosis ED Disposition    ED Disposition Condition [place]: [facility] [100102]  Level of Care: Stepdown [14]  Admit to SDU based on following criteria: Respiratory Distress:  Frequent assessment and/or intervention to maintain adequate ventilation/respiration, pulmonary toilet, and respiratory treatment.  Diagnosis: Acute respiratory failure with hypoxia [facility]) [[id number]]  Admitting Physician: Mariel Aloe [phone]  Attending Physician: Mariel Aloe [phone]  Estimated length of stay: past midnight tomorrow  Certification:: I certify this patient will need inpatient services for at least 2 midnights  PT Class (Do Not Modify): Inpatient [101]  PT Acc Code (Do Not Modify): Private [1]       Medical History Past Medical History:  Diagnosis Date  . Allergic rhinitis, cause unspecified   . Anxiety state, unspecified   . Diaphragmatic hernia without mention of obstruction or gangrene   . Diverticulosis of colon (without mention of hemorrhage)   . Female stress incontinence   . Insomnia, unspecified   . Interstitial cystitis    surgery 02/01/2013  . Irritable bowel syndrome   . Memory loss   . Other and unspecified hyperlipidemia   . Other premature beats   . Polyneuropathy in diabetes(357.2)   . Postmenopausal bleeding   . Proteinuria   . Stroke ([place])   . 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 Unspecified essential hypertension   . Viral warts, unspecified     Allergies Allergies  Allergen  Reactions  . Jardiance [Empagliflozin] Diarrhea  . Metformin And Related Diarrhea  . Ceclor [Cefaclor] Other (See Comments)    Unknown reaction; not recalled  . Codeine Nausea And Vomiting  . Lopid [Gemfibrozil] Other (See Comments)    Unknown reaction; not recalled  . Metoprolol Nausea And Vomiting  . Morphine And Related Other (See Comments)    Passed out  . Niacin And Related Other (See Comments)    Unknown reaction; not recalled  . Relafen [Nabumetone] Nausea And Vomiting  . Septra [Sulfamethoxazole-Trimethoprim] Nausea And Vomiting  . Tetracycline Nausea And Vomiting  . Toprol Xl [Metoprolol Tartrate] Nausea And Vomiting  . Adhesive [Tape] Rash and Other (See Comments)    Tears the skin also    IV Location/Drains/Wounds Patient Lines/Drains/Airways Status   Active Line/Drains/Airways    Name:   Placement date:   Placement time:   Site:   Days:   Peripheral IV 01/31/18 Left Hand   01/31/18    1402    Hand   less than 1   Peripheral IV 01/31/18 Left Antecubital   01/31/18    1302    Antecubital   less than 1          Labs/Imaging Results for orders placed or performed during the hospital encounter of 01/31/18 (from the past 48 hour(s))  Comprehensive metabolic panel     Status: Abnormal   Collection Time: 01/31/18  1:47 PM  Result Value Ref Range   Sodium 138 135 - 145 mmol/L   Potassium 3.3 (L) 3.5 - 5.1 mmol/L   Chloride 103 98 -  111 mmol/L   CO2 24 22 - 32 mmol/L   Glucose, Bld 174 (H) 70 - 99 mg/dL   BUN 18 8 - 23 mg/dL   Creatinine, Ser 1.27 (H) 0.44 - 1.00 mg/dL   Calcium 9.6 8.9 - 10.3 mg/dL   Total Protein 7.7 6.5 - 8.1 g/dL   Albumin 4.1 3.5 - 5.0 g/dL   AST 18 15 - 41 U/L   ALT 14 0 - 44 U/L   Alkaline Phosphatase 100 38 - 126 U/L   Total Bilirubin 0.7 0.3 - 1.2 mg/dL   GFR calc non Af Amer 38 (L) >60 mL/min   GFR calc Af Amer 44 (L) >60 mL/min    Comment: (NOTE) The eGFR has been calculated using the CKD EPI equation. This calculation has not been  validated in all clinical situations. eGFR's persistently <60 mL/min signify possible Chronic Kidney Disease.    Anion gap 11 5 - 15    Comment: Performed at [facility], [place] [address], [place], [place] [address]  CBC WITH DIFFERENTIAL     Status: Abnormal   Collection Time: 01/31/18  1:47 PM  Result Value Ref Range   WBC 9.4 4.0 - 10.5 K/uL   RBC 4.32 3.87 - 5.11 MIL/uL   Hemoglobin 12.6 12.0 - 15.0 g/dL   HCT 40.7 36.0 - 46.0 %   MCV 94.2 80.0 - 100.0 fL   MCH 29.2 26.0 - 34.0 pg   MCHC 31.0 30.0 - 36.0 g/dL   RDW 16.6 (H) 11.5 - 15.5 %   Platelets 170 150 - 400 K/uL   nRBC 0.0 0.0 - 0.2 %   Neutrophils Relative % 91 %   Neutro Abs 8.6 (H) 1.7 - 7.7 K/uL   Lymphocytes Relative 5 %   Lymphs Abs 0.5 (L) 0.7 - 4.0 K/uL   Monocytes Relative 3 %   Monocytes Absolute 0.3 0.1 - 1.0 K/uL   Eosinophils Relative 0 %   Eosinophils Absolute 0.0 0.0 - 0.5 K/uL   Basophils Relative 0 %   Basophils Absolute 0.0 0.0 - 0.1 K/uL   Immature Granulocytes 1 %   Abs Immature Granulocytes 0.08 (H) 0.00 - 0.07 K/uL    Comment: Performed at [facility], [place] [address], [place], [place] [address]  Urinalysis, Routine w reflex microscopic     Status: Abnormal   Collection Time: 01/31/18  1:49 PM  Result Value Ref Range   Color, Urine YELLOW YELLOW   APPearance CLEAR CLEAR   Specific Gravity, Urine 1.012 1.005 - 1.030   pH 6.0 5.0 - 8.0   Glucose, UA NEGATIVE NEGATIVE mg/dL   Hgb urine dipstick MODERATE (A) NEGATIVE   Bilirubin Urine NEGATIVE NEGATIVE   Ketones, ur NEGATIVE NEGATIVE mg/dL   Protein, ur 30 (A) NEGATIVE mg/dL   Nitrite NEGATIVE NEGATIVE   Leukocytes, UA NEGATIVE NEGATIVE   RBC / HPF 0-5 0 - 5 RBC/hpf   WBC, UA 0-5 0 - 5 WBC/hpf   Bacteria, UA RARE (A) NONE SEEN   Squamous Epithelial / LPF 6-10 0 - 5   Mucus PRESENT    Hyaline Casts, UA PRESENT     Comment: Performed at [facility], [place] [address], [place],  [place] [address]  I-Stat CG4 Lactic Acid, ED  (not at  [facility])     Status: None   Collection Time: 01/31/18  1:57 PM  Result Value Ref Range   Lactic Acid, Venous 1.82 0.5 - 1.9  mmol/L  Influenza panel by PCR (type A & B)     Status: None   Collection Time: 01/31/18  1:57 PM  Result Value Ref Range   Influenza A By PCR NEGATIVE NEGATIVE   Influenza B By PCR NEGATIVE NEGATIVE    Comment: (NOTE) The Xpert Xpress Flu assay is intended as an aid in the diagnosis of  influenza and should not be used as a sole basis for treatment.  This  assay is FDA approved for nasopharyngeal swab specimens only. Nasal  washings and aspirates are unacceptable for Xpert Xpress Flu testing. Performed at [facility], [place] [address], [place], [place] [address]   Troponin I     Status: Abnormal   Collection Time: 01/31/18  4:10 PM  Result Value Ref Range   Troponin I 0.03 (HH) <0.03 ng/mL    Comment: CRITICAL RESULT CALLED TO, READ BACK BY AND VERIFIED WITH: TARF,A RN '@[id number]'  ON 01/31/18 JACKSON,K Performed at [facility], [place] [address] [place], [place] [address]    Dg Chest 2 View  Result Date: 01/31/2018 CLINICAL DATA:  Shortness of breath and cough for the past 3 days. EXAM: CHEST - 2 VIEW COMPARISON:  Chest x-ray dated December 15, 2017. FINDINGS: The heart remains at the upper limits of normal in size. Normal pulmonary vascularity. Atherosclerotic calcification of the aortic arch. No focal consolidation, pleural effusion, or pneumothorax. No acute osseous abnormality. Unchanged loop recorder. IMPRESSION: No active cardiopulmonary disease. Electronically Signed   By: Titus Dubin M.D.   On: 01/31/2018 13:25   EKG Interpretation  Date/Time:  Saturday January 31 2018 15:58:51 EDT Ventricular Rate:  116 PR Interval:    QRS Duration: 94 QT Interval:  350 QTC Calculation: 487 R Axis:   78 Text Interpretation:  Sinus arrhythmia with pvc Nonspecific repol abnormality,  diffuse leads Confirmed by Varney Biles [phone]) on 01/31/2018 4:17:13 PM   Pending Labs Unresulted Labs (From admission, onward)    Start     Ordered   01/31/18 1323  Respiratory Panel by PCR  (Respiratory virus panel)  Once,   R     01/31/18 1322   01/31/18 1321  Blood Culture (routine x 2)  BLOOD CULTURE X 2,   STAT     01/31/18 1321   Signed and Held  Comprehensive metabolic panel  Tomorrow morning,   R     Signed and Held   Signed and Held  CBC  Tomorrow morning,   R     Signed and Held          Vitals/Pain Today's Vitals   01/31/18 1556 01/31/18 1600 01/31/18 1620 01/31/18 1700  BP: (!) 159/66 (!) 171/61  126/60  Pulse: (!) 111 (!) 110  85  Resp: 20 (!) 28  (!) 23  Temp:      TempSrc:      SpO2: 93% 92%  94%  PainSc:   10-Worst pain ever     Isolation Precautions Droplet precaution  Medications Medications  insulin aspart (novoLOG) injection 0-9 Units (has no administration in time range)  ipratropium-albuterol (DUONEB) 0.5-2.5 (3) MG/3ML nebulizer solution 3 mL (3 mLs Nebulization Given 01/31/18 1406)  albuterol (PROVENTIL,VENTOLIN) solution continuous neb (10 mg/hr Nebulization Given 01/31/18 1508)  levofloxacin (LEVAQUIN) IVPB 500 mg (0 mg Intravenous Stopped 01/31/18 1657)    Mobility walks

## 2018-01-31 NOTE — ED Notes (Signed)
Pt's O2 saturation dropped to 88 while on RA. Pt placed back on 4L [place]. O2 saturation now 95%

## 2018-01-31 NOTE — ED Notes (Signed)
Bed: WA01 Expected date:  Expected time:  Means of arrival:  Comments: SOB

## 2018-01-31 NOTE — ED Provider Notes (Signed)
[place] DEPT Provider Note   CSN: [id number] Arrival date & time: 01/31/18  1121     History   Chief Complaint Chief Complaint  Patient presents with  . Shortness of Breath    HPI Penny Miller is a 84 y.o. female.  HPI  84 year old female comes in with chief complaint of shortness of breath. Patient has history of diabetes, stroke.  She does not have any underlying lung disease or cardiac problems. According to the patient, her son got sick about a week ago.  He has started feeling better but she started having cough, sore throat and URI-like symptoms on Wednesday.  Patient's cough is producing green phlegm and she is having wheezing.  She is also noted worsening fatigue and malaise with exertional shortness of breath.  Patient has chest pain with deep cough.  There is no history of PE, DVT and at baseline patient is active.  Past Medical History:  Diagnosis Date  . Allergic rhinitis, cause unspecified   . Anxiety state, unspecified   . Diaphragmatic hernia without mention of obstruction or gangrene   . Diverticulosis of colon (without mention of hemorrhage)   . Female stress incontinence   . Insomnia, unspecified   . Interstitial cystitis    surgery 02/01/2013  . Irritable bowel syndrome   . Memory loss   . Other and unspecified hyperlipidemia   . Other premature beats   . Polyneuropathy in diabetes(357.2)   . Postmenopausal bleeding   . Proteinuria   . Stroke ([place])   . 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 Unspecified essential hypertension   . Viral warts, unspecified     Patient Active Problem List   Diagnosis Date Noted  . Spinal stenosis of lumbar region with neurogenic claudication 07/21/2017  . Depression, major, in remission ([place]) 07/21/2017  . Vertigo of central origin 07/21/2017  . Benign hypertension 07/21/2017  . Overweight (BMI  25.0-29.9) 11/07/2016  . Paroxysmal atrial fibrillation ([place]) 09/03/2016  . Diabetic polyneuropathy associated with type 2 diabetes mellitus ([place]) 09/03/2016  . Aneurysm of vertebral artery ([place]) 08/12/2016  . Embolic stroke involving left cerebellar artery ([place]) 07/29/2016  . Chronic renal disease, stage II 05/08/2016  . Palpitations 06/27/2015  . Dyspnea 06/27/2015  . Trigger finger 06/27/2015  . History of fall 02/28/2015  . DM (diabetes mellitus), type 2 with renal complications ([place]) 21/22/4825  . Sinusitis, chronic 06/22/2014  . Varicose vein of leg 12/21/2013  . Seborrheic keratosis 12/21/2013  . Deaf 06/15/2013  . Bladder infection, chronic 01/04/2013  . Polyneuropathy in diabetes(357.2)   . Memory loss   . Insomnia, unspecified   . Essential hypertension   . Lipoma of unspecified site   . Hyperlipidemia   . Pain in joint, ankle and foot 11/20/2012  . Herpes zoster infection of thoracic region 08/28/2012  . Chronic interstitial cystitis     Past Surgical History:  Procedure Laterality Date  . ABDOMINAL HYSTERECTOMY  1977   Dr.Hambright  . APPENDECTOMY  1965  . CATARACT EXTRACTION, BILATERAL  02/10/2006   Dr.Groat   . COLONOSCOPY  07/17/2004   polypectomy  . [place]  . LOOP RECORDER INSERTION N/A 07/31/2016   Procedure: Loop Recorder Insertion;  Surgeon: [name], MD;  Location: [place] [facility] LAB;  Service: Cardiovascular;  Laterality: N/A;  . TEE WITHOUT CARDIOVERSION N/A 07/30/2016   Procedure: TRANSESOPHAGEAL ECHOCARDIOGRAM (TEE);  Surgeon:  [name], MD;  Location: [facility];  Service: Cardiovascular;  Laterality: N/A;  . [place]  . TRIGGER FINGER RELEASE  05/13/16  . urinary bladder      surgery 02/01/2013 to stretch bladder stem     OB History   None      Home Medications    Prior to Admission medications   Medication Sig Start Date End Date Taking? Authorizing Provider  albuterol  (PROVENTIL HFA;VENTOLIN HFA) 108 (90 Base) MCG/ACT inhaler Inhale 2 puffs into the lungs every 6 (six) hours as needed for wheezing or shortness of breath. 10/08/17  Yes Eulas Post, Monica, DO  amLODipine (NORVASC) 2.5 MG tablet Take one tablet twice daily for blood pressure Patient taking differently: Take 2.5 mg by mouth daily.  01/20/18  Yes Reed, [name] L, DO  apixaban (ELIQUIS) 5 MG TABS tablet Take 1 tablet (5 mg total) by mouth 2 (two) times daily. 09/23/16  Yes Sherran Needs, NP  atenolol (TENORMIN) 50 MG tablet Take one tablet by mouth once daily to help blood pressure Patient taking differently: Take 25 mg by mouth 2 (two) times daily.  09/01/17  Yes Reed, [name] L, DO  atorvastatin (LIPITOR) 40 MG tablet Take 1 tablet (40 mg total) by mouth daily at 6 PM. 09/01/17  Yes Reed, [name] L, DO  citalopram (CELEXA) 10 MG tablet Take 1 tablet (10 mg total) by mouth daily. 09/02/17  Yes Reed, [name] L, DO  gabapentin (NEURONTIN) 300 MG capsule Take 1 capsule (300 mg total) by mouth 2 (two) times daily. 12/02/17  Yes Reed, [name] L, DO  meclizine (ANTIVERT) 25 MG tablet Take 1 tablet (25 mg total) by mouth 3 (three) times daily as needed for dizziness. 06/15/17  Yes Jacqlyn Larsen, PA-C  pioglitazone (ACTOS) 15 MG tablet Take 1 tablet (15 mg total) by mouth daily. 01/08/18  Yes Reed, [name] L, DO  Vitamin D, Ergocalciferol, (DRISDOL) 50000 units CAPS capsule TAKE 1 CAPSULE WEEKLY FOR  VITAMIN D Patient taking differently: Take 50,000 Units by mouth every 7 (seven) days. On Saturday 03/31/17  Yes Reed, [name] L, DO  doxycycline (VIBRA-TABS) 100 MG tablet Take 1 tablet (100 mg total) by mouth 2 (two) times daily. Patient not taking: Reported on 01/31/2018 01/08/18   Mariea Clonts, [name] L, DO  glucose blood (ACCU-CHEK AVIVA PLUS) test strip Use to test blood sugar twice daily. Dx: E11.29 08/11/17   Reed, [name] L, DO  Lancets (ACCU-CHEK MULTICLIX) lancets Use to test blood sugar twice daily. E11.29 07/04/17   [name], DO    Family History Family History  Problem Relation Age of Onset  . Cancer Mother        pancreatic  . Cancer Father        bladder  . Heart attack Father   . Hypertension Sister   . Diabetes Sister   . Diabetes Brother   . Kidney cancer Brother   . Hypertension Brother   . Diabetes Brother   . Heart disease Brother 68       Myocardial Infarction     Social History Social History   Tobacco Use  . Smoking status: Former Smoker    Packs/day: 0.25    Years: 62.00    Pack years: 15.50    Last attempt to quit: 04/02/2011    Years since quitting: 6.8  . Smokeless tobacco: Never Used  . Tobacco comment: stopped consistently smoking in 2012 but still  smokes occasionally (one pack per week)  Substance Use Topics  . Alcohol use: No  . Drug use: No     Allergies   Jardiance [empagliflozin]; Metformin and related; Ceclor [cefaclor]; Codeine; Lopid [gemfibrozil]; Metoprolol; Morphine and related; Niacin and related; Relafen [nabumetone]; Septra [sulfamethoxazole-trimethoprim]; Tetracycline; Toprol xl [metoprolol tartrate]; and Adhesive [tape]   Review of Systems Review of Systems  Constitutional: Positive for activity change.  Respiratory: Positive for shortness of breath.   All other systems reviewed and are negative.    Physical Exam Updated Vital Signs BP (!) 159/66   Pulse (!) 111   Temp 97.9 F (36.6 C) (Oral)   Resp 20   SpO2 93%   Physical Exam  Constitutional: She is oriented to person, place, and time. She appears well-developed.  HENT:  Head: Normocephalic and atraumatic.  Eyes: EOM are normal.  Neck: Normal range of motion. Neck supple.  Cardiovascular: Normal rate.  Pulmonary/Chest: Effort normal. She has no decreased breath sounds. She has rhonchi in the right upper field, the right middle field, the right lower field, the left upper field, the left middle field and the left lower field. She has no rales.  Abdominal: Bowel sounds are  normal.  Neurological: She is alert and oriented to person, place, and time.  Skin: Skin is warm and dry.  Nursing note and vitals reviewed.    ED Treatments / Results  Labs (all labs ordered are listed, but only abnormal results are displayed) Labs Reviewed  COMPREHENSIVE METABOLIC PANEL - Abnormal; Notable for the following components:      Result Value   Potassium 3.3 (*)    Glucose, Bld 174 (*)    Creatinine, Ser 1.27 (*)    GFR calc non Af Amer 38 (*)    GFR calc Af Amer 44 (*)    All other components within normal limits  CBC WITH DIFFERENTIAL/PLATELET - Abnormal; Notable for the following components:   RDW 16.6 (*)    Neutro Abs 8.6 (*)    Lymphs Abs 0.5 (*)    Abs Immature Granulocytes 0.08 (*)    All other components within normal limits  CULTURE, BLOOD (ROUTINE X 2)  CULTURE, BLOOD (ROUTINE X 2)  RESPIRATORY PANEL BY PCR  INFLUENZA PANEL BY PCR (TYPE A & B)  URINALYSIS, ROUTINE W REFLEX MICROSCOPIC  TROPONIN I  I-STAT CG4 LACTIC ACID, ED    EKG EKG Interpretation  Date/Time:  Saturday January 31 2018 15:58:51 EDT Ventricular Rate:  116 PR Interval:    QRS Duration: 94 QT Interval:  350 QTC Calculation: 487 R Axis:   78 Text Interpretation:  Sinus arrhythmia with pvc Nonspecific repol abnormality, diffuse leads Confirmed by Varney Biles [phone]) on 01/31/2018 4:17:13 PM   Radiology Dg Chest 2 View  Result Date: 01/31/2018 CLINICAL DATA:  Shortness of breath and cough for the past 3 days. EXAM: CHEST - 2 VIEW COMPARISON:  Chest x-ray dated December 15, 2017. FINDINGS: The heart remains at the upper limits of normal in size. Normal pulmonary vascularity. Atherosclerotic calcification of the aortic arch. No focal consolidation, pleural effusion, or pneumothorax. No acute osseous abnormality. Unchanged loop recorder. IMPRESSION: No active cardiopulmonary disease. Electronically Signed   By: Titus Dubin M.D.   On: 01/31/2018 13:25     Procedures .Critical Care Performed by: Varney Biles, MD Authorized by: Varney Biles, MD   Critical care provider statement:    Critical care time (minutes):  45   Critical care start time:  01/31/2018  1:24 PM   Critical care end time:  01/31/2018 4:24 PM   Critical care was necessary to treat or prevent imminent or life-threatening deterioration of the following conditions:  Respiratory failure   Critical care was time spent personally by me on the following activities:  Discussions with consultants, evaluation of patient's response to treatment, examination of patient, ordering and performing treatments and interventions, ordering and review of laboratory studies, ordering and review of radiographic studies, pulse oximetry, re-evaluation of patient's condition, obtaining history from patient or surrogate and review of old charts   (including critical care time)  Medications Ordered in ED Medications  levofloxacin (LEVAQUIN) IVPB 500 mg (500 mg Intravenous New Bag/Given 01/31/18 1557)  ipratropium-albuterol (DUONEB) 0.5-2.5 (3) MG/3ML nebulizer solution 3 mL (3 mLs Nebulization Given 01/31/18 1406)  albuterol (PROVENTIL,VENTOLIN) solution continuous neb (10 mg/hr Nebulization Given 01/31/18 1508)     Initial Impression / Assessment and Plan / ED Course  I have reviewed the triage vital signs and the nursing notes.  Pertinent labs & imaging results that were available during my care of the patient were reviewed by me and considered in my medical decision making (see chart for details).  Clinical Course as of Feb 01 1623  Sat Jan 31, 2018  1400 White count is 9.4.  Lactic acid is 1.82. It appears to me that patient likely has influenza.  Flu results are pending at this time. No fevers, no white count -based on my evaluation at this time patient is not septic.  We will not start any antibiotics for now.  Influenza test is pending. Code sepsis will be discontinued.   [AN]  1546  Influenza swab results came back and are negative.  I will give Levaquin to patient as an evolving or questionable pneumonia.  Suspicion is still high for likely viral etiology. She will need admission to the hospital.  Influenza panel by PCR (type A & B) [AN]  4287 Patient started on hour-long breathing treatment and she started complaining of chest discomfort.  EKG is not showing any acute STEMI.  Troponin has been added.  Admitting team notified.   [AN]    Clinical Course User Index [AN] Varney Biles, MD    84 year old female comes in with chief complaint of shortness of breath.  Upon my examination, she is noted to be tachypneic and having stridulous breath sounds with rhonchi and fine wheezing diffusely. Based on history and exam it appears that she is having viral etiology.  When return of the oxygen, her O2 sats drops to 87%. X-ray ordered and there is no focal pneumonia.  Clinically, we are not concerned of PE because of underlying infection-like symptoms.  Final Clinical Impressions(s) / ED Diagnoses   Final diagnoses:  Acute respiratory failure with hypoxia [facility])    ED Discharge Orders    None       Varney Biles, MD 01/31/18 1624

## 2018-01-31 NOTE — H&P (Signed)
History and Physical    Penny Miller TDD:[id number] DOB: [birth year] DOA: 01/31/2018  PCP: [name], DO Patient coming from: Home  Chief Complaint: Shortness of breath  HPI: Penny Miller is a 84 y.o. female with medical history significant of Atrial fibrillation, diabetes mellitus, type 2, embolic stroke, hypertension, depression. Patient presents secondary to worsening shortness of breath over the last 5 days. She reports her son as having similar symptoms prior to her symptoms starting. IN addition to shortness of breath, she has had a cough and "head cold." She has the feeling of congestion but has not been able to produce and phlegm. No fevers, but has had nausea and vomiting. She took some doxycycline (she had left over pills) which did not help with her symptoms.  ED Course: Vitals: Afebrile, initially normal pulse which increased after albuterol, mild tachypnea, on 4-8 L via [place] Labs: Potassium of 3.3, creatinine of 1.27 Imaging: Chest x-ray unremarkable Medications/Course: Levaquin, albuterol continuous, Tamiflu, Flu negative, RVP pending  Review of Systems: Review of Systems  Constitutional: Negative for chills and fever.  Respiratory: Positive for cough, shortness of breath and wheezing. Negative for hemoptysis and sputum production.   Cardiovascular: Positive for chest pain (after albuterol).  Gastrointestinal: Positive for nausea and vomiting. Negative for abdominal pain, blood in stool, constipation, diarrhea and melena.  All other systems reviewed and are negative.   Past Medical History:  Diagnosis Date  . Allergic rhinitis, cause unspecified   . Anxiety state, unspecified   . Diaphragmatic hernia without mention of obstruction or gangrene   . Diverticulosis of colon (without mention of hemorrhage)   . Female stress incontinence   . Insomnia, unspecified   . Interstitial cystitis    surgery 02/01/2013  . Irritable bowel syndrome   . Memory loss   . Other  and unspecified hyperlipidemia   . Other premature beats   . Polyneuropathy in diabetes(357.2)   . Postmenopausal bleeding   . Proteinuria   . Stroke ([place])   . 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 Unspecified essential hypertension   . Viral warts, unspecified     Past Surgical History:  Procedure Laterality Date  . ABDOMINAL HYSTERECTOMY  1977   Dr.Hambright  . APPENDECTOMY  1965  . CATARACT EXTRACTION, BILATERAL  02/10/2006   Dr.Groat   . COLONOSCOPY  07/17/2004   polypectomy  . [place]  . LOOP RECORDER INSERTION N/A 07/31/2016   Procedure: Loop Recorder Insertion;  Surgeon: [name], MD;  Location: [facility];  Service: Cardiovascular;  Laterality: N/A;  . TEE WITHOUT CARDIOVERSION N/A 07/30/2016   Procedure: TRANSESOPHAGEAL ECHOCARDIOGRAM (TEE);  Surgeon: [name], MD;  Location: [facility];  Service: Cardiovascular;  Laterality: N/A;  . [place]  . TRIGGER FINGER RELEASE  05/13/16  . urinary bladder      surgery 02/01/2013 to stretch bladder stem     reports that she quit smoking about 6 years ago. She has a 15.50 pack-year smoking history. She has never used smokeless tobacco. She reports that she does not drink alcohol or use drugs.  Allergies  Allergen Reactions  . Jardiance [Empagliflozin] Diarrhea  . Metformin And Related Diarrhea  . Ceclor [Cefaclor] Other (See Comments)    Unknown reaction; not recalled  . Codeine Nausea And Vomiting  . Lopid [Gemfibrozil] Other (See Comments)  Unknown reaction; not recalled  . Metoprolol Nausea And Vomiting  . Morphine And Related Other (See Comments)    Passed out  . Niacin And Related Other (See Comments)    Unknown reaction; not recalled  . Relafen [Nabumetone] Nausea And Vomiting  . Septra [Sulfamethoxazole-Trimethoprim] Nausea And Vomiting  . Tetracycline Nausea  And Vomiting  . Toprol Xl [Metoprolol Tartrate] Nausea And Vomiting  . Adhesive [Tape] Rash and Other (See Comments)    Tears the skin also    Family History  Problem Relation Age of Onset  . Cancer Mother        pancreatic  . Cancer Father        bladder  . Heart attack Father   . Hypertension Sister   . Diabetes Sister   . Diabetes Brother   . Kidney cancer Brother   . Hypertension Brother   . Diabetes Brother   . Heart disease Brother 88       Myocardial Infarction     Prior to Admission medications   Medication Sig Start Date End Date Taking? Authorizing Provider  albuterol (PROVENTIL HFA;VENTOLIN HFA) 108 (90 Base) MCG/ACT inhaler Inhale 2 puffs into the lungs every 6 (six) hours as needed for wheezing or shortness of breath. 10/08/17  Yes Eulas Post, Monica, DO  amLODipine (NORVASC) 2.5 MG tablet Take one tablet twice daily for blood pressure Patient taking differently: Take 2.5 mg by mouth daily.  01/20/18  Yes Reed, [name] L, DO  apixaban (ELIQUIS) 5 MG TABS tablet Take 1 tablet (5 mg total) by mouth 2 (two) times daily. 09/23/16  Yes Sherran Needs, NP  atenolol (TENORMIN) 50 MG tablet Take one tablet by mouth once daily to help blood pressure Patient taking differently: Take 25 mg by mouth 2 (two) times daily.  09/01/17  Yes Reed, [name] L, DO  atorvastatin (LIPITOR) 40 MG tablet Take 1 tablet (40 mg total) by mouth daily at 6 PM. 09/01/17  Yes Reed, [name] L, DO  citalopram (CELEXA) 10 MG tablet Take 1 tablet (10 mg total) by mouth daily. 09/02/17  Yes Reed, [name] L, DO  gabapentin (NEURONTIN) 300 MG capsule Take 1 capsule (300 mg total) by mouth 2 (two) times daily. 12/02/17  Yes Reed, [name] L, DO  meclizine (ANTIVERT) 25 MG tablet Take 1 tablet (25 mg total) by mouth 3 (three) times daily as needed for dizziness. 06/15/17  Yes Jacqlyn Larsen, PA-C  pioglitazone (ACTOS) 15 MG tablet Take 1 tablet (15 mg total) by mouth daily. 01/08/18  Yes Reed, [name] L, DO  Vitamin D,  Ergocalciferol, (DRISDOL) 50000 units CAPS capsule TAKE 1 CAPSULE WEEKLY FOR  VITAMIN D Patient taking differently: Take 50,000 Units by mouth every 7 (seven) days. On Saturday 03/31/17  Yes Reed, [name] L, DO  doxycycline (VIBRA-TABS) 100 MG tablet Take 1 tablet (100 mg total) by mouth 2 (two) times daily. Patient not taking: Reported on 01/31/2018 01/08/18   Mariea Clonts, [name] L, DO  glucose blood (ACCU-CHEK AVIVA PLUS) test strip Use to test blood sugar twice daily. Dx: E11.29 08/11/17   Reed, [name] L, DO  Lancets (ACCU-CHEK MULTICLIX) lancets Use to test blood sugar twice daily. E11.29 07/04/17   [name], DO    Physical Exam:  Physical Exam  Constitutional: She is oriented to person, place, and time. She appears well-developed and well-nourished. No distress.  HENT:  Mouth/Throat: Oropharynx is clear and moist.  Eyes: Pupils are equal, round, and reactive to light. Conjunctivae and  EOM are normal.  Neck: Normal range of motion.  Cardiovascular: Normal rate, regular rhythm and normal heart sounds.  No murmur heard. Pulmonary/Chest: Effort normal. Tachypnea noted. No respiratory distress. She has decreased breath sounds. She has wheezes. She has no rhonchi. She has no rales.  Abdominal: Soft. Bowel sounds are normal. She exhibits no distension. There is no tenderness. There is no rebound and no guarding.  Musculoskeletal: Normal range of motion. She exhibits no edema or tenderness.  Lymphadenopathy:    She has no cervical adenopathy.  Neurological: She is alert and oriented to person, place, and time.  Skin: Skin is warm and dry. She is not diaphoretic.     Labs on Admission: I have personally reviewed following labs and imaging studies  CBC: Recent Labs  Lab 01/31/18 1347  WBC 9.4  NEUTROABS 8.6*  HGB 12.6  HCT 40.7  MCV 94.2  PLT 387    Basic Metabolic Panel: Recent Labs  Lab 01/31/18 1347  NA 138  K 3.3*  CL 103  CO2 24  GLUCOSE 174*  BUN 18  CREATININE  1.27*  CALCIUM 9.6    GFR: CrCl cannot be calculated (Unknown ideal weight.).  Liver Function Tests: Recent Labs  Lab 01/31/18 1347  AST 18  ALT 14  ALKPHOS 100  BILITOT 0.7  PROT 7.7  ALBUMIN 4.1   No results for input(s): LIPASE, AMYLASE in the last 168 hours. No results for input(s): AMMONIA in the last 168 hours.  Coagulation Profile: No results for input(s): INR, PROTIME in the last 168 hours.  Cardiac Enzymes: Recent Labs  Lab 01/31/18 1610  TROPONINI 0.03*    BNP (last 3 results) No results for input(s): PROBNP in the last 8760 hours.  HbA1C: No results for input(s): HGBA1C in the last 72 hours.  CBG: No results for input(s): GLUCAP in the last 168 hours.  Lipid Profile: No results for input(s): CHOL, HDL, LDLCALC, TRIG, CHOLHDL, LDLDIRECT in the last 72 hours.  Thyroid Function Tests: No results for input(s): TSH, T4TOTAL, FREET4, T3FREE, THYROIDAB in the last 72 hours.  Anemia Panel: No results for input(s): VITAMINB12, FOLATE, FERRITIN, TIBC, IRON, RETICCTPCT in the last 72 hours.  Urine analysis:    Component Value Date/Time   COLORURINE YELLOW 01/31/2018 [place] 01/31/2018 1349   APPEARANCEUR Cloudy (A) 12/17/2013 0906   LABSPEC 1.012 01/31/2018 1349   PHURINE 6.0 01/31/2018 1349   GLUCOSEU NEGATIVE 01/31/2018 1349   HGBUR MODERATE (A) 01/31/2018 1349   BILIRUBINUR NEGATIVE 01/31/2018 1349   BILIRUBINUR Negative 12/17/2013 0906   KETONESUR NEGATIVE 01/31/2018 1349   PROTEINUR 30 (A) 01/31/2018 1349   NITRITE NEGATIVE 01/31/2018 1349   LEUKOCYTESUR NEGATIVE 01/31/2018 1349   LEUKOCYTESUR Negative 12/17/2013 0906     Radiological Exams on Admission: Dg Chest 2 View  Result Date: 01/31/2018 CLINICAL DATA:  Shortness of breath and cough for the past 3 days. EXAM: CHEST - 2 VIEW COMPARISON:  Chest x-ray dated December 15, 2017. FINDINGS: The heart remains at the upper limits of normal in size. Normal pulmonary  vascularity. Atherosclerotic calcification of the aortic arch. No focal consolidation, pleural effusion, or pneumothorax. No acute osseous abnormality. Unchanged loop recorder. IMPRESSION: No active cardiopulmonary disease. Electronically Signed   By: Titus Dubin M.D.   On: 01/31/2018 13:25    EKG: Independently reviewed. 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 Significant wheezing on exam. Initially required 4 L which was bumped up to 8 L. No distress on exam. Able to speak in complete sentences.  -RVP pending -Discontinue antibiotics -Solumedrol daily -Wean oxygen as able  History of CVA Embolic secondary to atrial fibrillation. Patient is s/p loop recorder. -Continue Lipitor  Atrial fibrillation Briefly with RVR secondary to albuterol nebulizer treatment in the ED. Rates improved. Was in sinus rhythm on arrival to ED. -Continue atenolol and Eliquis  Diabetes mellitus, type 2 On actos as an outpatient. -SSI -Hold actos  Depression -Celexa  Essential hypertension -Atenolol  Sinus pause Per chart review. Recently occurred. Asymptomatic. Patient with history of atrial fibrillation -Telemetry   DVT prophylaxis: Eliquis Code Status: DNR Family Communication: None at bedside Disposition Plan: Stepdown unit Consults called: None Admission status: Inpatient   Cordelia Poche, MD [facility] 01/31/2018, 5:41 PM  If 7PM-7AM, please contact night-coverage [email or web address] Password [email or web address]

## 2018-01-31 NOTE — ED Notes (Addendum)
Date and time results received: 01/31/18 1709   Test: Troponin Critical Value: 0.03  Name of Provider Notified:Dr. Lonny Prude

## 2018-02-01 ENCOUNTER — Other Ambulatory Visit: Payer: Self-pay

## 2018-02-01 DIAGNOSIS — E119 Type 2 diabetes mellitus without complications: Secondary | ICD-10-CM

## 2018-02-01 DIAGNOSIS — N179 Acute kidney failure, unspecified: Secondary | ICD-10-CM

## 2018-02-01 DIAGNOSIS — N183 Chronic kidney disease, stage 3 (moderate): Secondary | ICD-10-CM

## 2018-02-01 LAB — GLUCOSE, CAPILLARY
GLUCOSE-CAPILLARY: 188 mg/dL — AB (ref 70–99)
GLUCOSE-CAPILLARY: 180 mg/dL — AB (ref 70–99)
GLUCOSE-CAPILLARY: 208 mg/dL — AB (ref 70–99)
Glucose-Capillary: 168 mg/dL — ABNORMAL HIGH (ref 70–99)

## 2018-02-01 LAB — CBC
HCT: 33.3 % — ABNORMAL LOW (ref 36.0–46.0)
HEMOGLOBIN: 10.4 g/dL — AB (ref 12.0–15.0)
MCH: 29.7 pg (ref 26.0–34.0)
MCHC: 31.2 g/dL (ref 30.0–36.0)
MCV: 95.1 fL (ref 80.0–100.0)
Platelets: 155 10*3/uL (ref 150–400)
RBC: 3.5 MIL/uL — AB (ref 3.87–5.11)
RDW: 16.8 % — ABNORMAL HIGH (ref 11.5–15.5)
WBC: 16.2 10*3/uL — AB (ref 4.0–10.5)
nRBC: 0 % (ref 0.0–0.2)

## 2018-02-01 LAB — COMPREHENSIVE METABOLIC PANEL
ALT: 12 U/L (ref 0–44)
ANION GAP: 9 (ref 5–15)
AST: 16 U/L (ref 15–41)
Albumin: 3.2 g/dL — ABNORMAL LOW (ref 3.5–5.0)
Alkaline Phosphatase: 74 U/L (ref 38–126)
BUN: 35 mg/dL — ABNORMAL HIGH (ref 8–23)
CHLORIDE: 104 mmol/L (ref 98–111)
CO2: 24 mmol/L (ref 22–32)
CREATININE: 1.78 mg/dL — AB (ref 0.44–1.00)
Calcium: 9.2 mg/dL (ref 8.9–10.3)
GFR calc non Af Amer: 25 mL/min — ABNORMAL LOW (ref >60)
GFR, EST AFRICAN AMERICAN: 29 mL/min — AB (ref >60)
Glucose, Bld: 173 mg/dL — ABNORMAL HIGH (ref 70–99)
Potassium: 3.9 mmol/L (ref 3.5–5.1)
Sodium: 137 mmol/L (ref 135–145)
TOTAL PROTEIN: 6.3 g/dL — AB (ref 6.5–8.1)
Total Bilirubin: 0.5 mg/dL (ref 0.3–1.2)

## 2018-02-01 LAB — TROPONIN I
TROPONIN I: 0.07 ng/mL — AB (ref <0.03)
Troponin I: 0.06 ng/mL (ref <0.03)

## 2018-02-01 MED ORDER — SODIUM CHLORIDE 0.9 % IV SOLN
INTRAVENOUS | Status: AC
  Administered 2018-02-01 – 2018-02-02 (×2): via INTRAVENOUS

## 2018-02-01 MED ORDER — GUAIFENESIN ER 600 MG PO TB12
600.0000 mg | ORAL_TABLET | Freq: Two times a day (BID) | ORAL | Status: DC
  Administered 2018-02-01 – 2018-02-05 (×8): 600 mg via ORAL
  Filled 2018-02-01 (×9): qty 1

## 2018-02-01 MED ORDER — ALBUTEROL SULFATE (2.5 MG/3ML) 0.083% IN NEBU
2.5000 mg | INHALATION_SOLUTION | RESPIRATORY_TRACT | Status: DC | PRN
  Administered 2018-02-01: 2.5 mg via RESPIRATORY_TRACT
  Filled 2018-02-01: qty 3

## 2018-02-01 MED ORDER — IPRATROPIUM-ALBUTEROL 0.5-2.5 (3) MG/3ML IN SOLN
3.0000 mL | Freq: Four times a day (QID) | RESPIRATORY_TRACT | Status: DC
  Administered 2018-02-01 – 2018-02-02 (×6): 3 mL via RESPIRATORY_TRACT
  Filled 2018-02-01 (×6): qty 3

## 2018-02-01 MED ORDER — SODIUM CHLORIDE 0.9 % IV BOLUS
500.0000 mL | Freq: Once | INTRAVENOUS | Status: AC
  Administered 2018-02-01: 500 mL via INTRAVENOUS

## 2018-02-01 MED ORDER — APIXABAN 2.5 MG PO TABS
2.5000 mg | ORAL_TABLET | Freq: Two times a day (BID) | ORAL | Status: DC
  Administered 2018-02-01 – 2018-02-05 (×8): 2.5 mg via ORAL
  Filled 2018-02-01 (×8): qty 1

## 2018-02-01 NOTE — Progress Notes (Signed)
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 -continue to have significant diffuse wheezing and congested cough, currently at [place] oxygen supplement, able to talk in full sentences -cxr no acute findings on presentation, consider repeat cxr after hydration if not improving -continue steroids, add on scheduled nebs, mucinex, start ivf due to poor oral intake, will check urine streppneumo antigen and procalcitonin level  Leukocytosis: likely due to steroids, monitor  AKI on CKDIII -cr 1.27 on presentation, cr 1.78 today, she denies urinary symptom, she does has n/v prior to admission, she continues to have poor appetite -aki likely due to dehydration, will start ivf -repeat bmp in am -Renal dosing meds  Non insulin dependent dm2, uncontrolled, am blood glucose 173, likely due to stress and steroids use -on actos at home which is hold for now -recent a1c 7.7% -on ssi   Chronic afib Has afib/rvr in the ED Currently rate controlled has intermittent bradycardia, no chest pain, no edema -eliquis, atenolol with holding parameters  H/o CVA with mild right lower extremity weakness On eliquis/lipitor Reports still drives  Code Status: DNR  Family Communication: patient   Disposition Plan: transfer out of  stepdown unit to med tele   Consultants:  none  Procedures:  none  Antibiotics:  levaquin x1 on 11/2   Objective: BP (!) 135/49   Pulse (!) 55   Temp 97.9 F (36.6 C) (Oral)   Resp 16   Ht 5\' 8"  (1.727 m)   Wt 79 kg   SpO2 95%   BMI 26.48 kg/m   Intake/Output Summary (Last 24 hours) at 02/01/2018 1453 Last data filed at 01/31/2018 2000 Gross per 24 hour  Intake 340 ml  Output -  Net 340 ml   Filed Weights   01/31/18 1927  Weight: 79 kg    Exam: Patient is examined daily including today on 02/01/2018, exams remain the same as of yesterday except that has changed    General:  Appear weak, cough and wheezing, able to talk in full sentences  Cardiovascular: IRRR  Respiratory: diffuse bilateral wheezing  Abdomen: Soft/ND/NT, positive BS  Musculoskeletal: No Edema  Neuro: alert, oriented   Data Reviewed: Basic Metabolic Panel: Recent Labs  Lab 01/31/18 1347 02/01/18 0613  NA 138 137  K 3.3* 3.9  CL 103 104  CO2 24 24  GLUCOSE 174* 173*  BUN 18 35*  CREATININE 1.27* 1.78*  CALCIUM 9.6 9.2   Liver Function Tests: Recent Labs  Lab 01/31/18 1347 02/01/18 0613  AST 18 16  ALT 14 12  ALKPHOS 100 74  BILITOT 0.7 0.5  PROT 7.7 6.3*  ALBUMIN 4.1 3.2*   No results for input(s): LIPASE, AMYLASE in the last 168 hours. No results for input(s): AMMONIA in the last 168 hours. CBC: Recent Labs  Lab 01/31/18 1347 02/01/18 0613  WBC 9.4 16.2*  NEUTROABS 8.6*  --  HGB 12.6 10.4*  HCT 40.7 33.3*  MCV 94.2 95.1  PLT 170 155   Cardiac Enzymes:   Recent Labs  Lab 01/31/18 1610 02/01/18 0019 02/01/18 0613  TROPONINI 0.03* 0.07* 0.06*   BNP (last 3 results) No results for input(s): BNP in the last 8760 hours.  ProBNP (last 3 results) No results for input(s): PROBNP in the last 8760 hours.  CBG: Recent Labs  Lab 01/31/18 2139 02/01/18 0802 02/01/18 1218  GLUCAP 414* 168* 188*    Recent Results (from the past 240 hour(s))  Blood  Culture (routine x 2)     Status: None (Preliminary result)   Collection Time: 01/31/18  1:49 PM  Result Value Ref Range Status   Specimen Description BLOOD SPECIMEN SOURCE NOT MARKED ON REQUISITION  Final   Special Requests   Final    BOTTLES DRAWN AEROBIC AND ANAEROBIC Blood Culture results may not be optimal due to an excessive volume of blood received in culture bottles Performed at [facility] [address] [place], [place] [address]    Culture PENDING  Incomplete   Report Status PENDING  Incomplete  Respiratory Panel by PCR     Status: Abnormal   Collection Time: 01/31/18  1:59 PM  Result Value Ref Range Status   Adenovirus NOT DETECTED NOT DETECTED Final   Coronavirus 229E NOT DETECTED NOT DETECTED Final   Coronavirus HKU1 NOT DETECTED NOT DETECTED Final   Coronavirus NL63 NOT DETECTED NOT DETECTED Final   Coronavirus OC43 NOT DETECTED NOT DETECTED Final   Metapneumovirus NOT DETECTED NOT DETECTED Final   Rhinovirus / Enterovirus DETECTED (A) NOT DETECTED Final   Influenza A NOT DETECTED NOT DETECTED Final   Influenza B NOT DETECTED NOT DETECTED Final   Parainfluenza Virus 1 NOT DETECTED NOT DETECTED Final   Parainfluenza Virus 2 NOT DETECTED NOT DETECTED Final   Parainfluenza Virus 3 NOT DETECTED NOT DETECTED Final   Parainfluenza Virus 4 NOT DETECTED NOT DETECTED Final   Respiratory Syncytial Virus NOT DETECTED NOT DETECTED Final   Bordetella pertussis NOT DETECTED NOT DETECTED Final   Chlamydophila pneumoniae NOT DETECTED NOT DETECTED Final   Mycoplasma pneumoniae NOT DETECTED NOT DETECTED Final    Comment: Performed at [facility], [place] [address], [place], [place] [address]  MRSA PCR Screening     Status: None   Collection Time: 01/31/18  7:22 PM  Result Value Ref Range Status   MRSA by PCR NEGATIVE NEGATIVE Final    Comment:        The GeneXpert MRSA Assay (FDA approved for NASAL specimens only), is one component of a comprehensive MRSA  colonization surveillance program. It is not intended to diagnose MRSA infection nor to guide or monitor treatment for MRSA infections. Performed at [facility], [place] [address] [place], [place] [address]      Studies: No results found.  Scheduled Meds: . amLODipine  2.5 mg Oral Daily  . apixaban  2.5 mg Oral BID  . atenolol  25 mg Oral BID  . atorvastatin  40 mg Oral q1800  . citalopram  10 mg Oral Daily  . gabapentin  300 mg Oral BID  . insulin aspart  0-9 Units Subcutaneous TID WC  . methylPREDNISolone (SOLU-MEDROL) injection  40 mg Intravenous Daily    Continuous Infusions: . sodium chloride       Time spent: 52mins I have personally reviewed and interpreted on  02/01/2018 daily labs, tele strips, imagings as  discussed above under date review session and assessment and plans.  I reviewed all nursing notes, pharmacy notes,  vitals, pertinent old records  I have discussed plan of care as described above with RN , patient on 02/01/2018   Florencia Reasons MD, PhD  [facility] Pager [phone]. If 7PM-7AM, please contact night-coverage at [email or web address], password [facility] 02/01/2018, 2:53 PM  LOS: 1 day

## 2018-02-01 NOTE — Progress Notes (Signed)
Pt transferred to room 1407, oriented to room & unit. VSS, O2 Sats stable on 2L/[place]. Belongings at bedside. Purewick in place. No signs of distress.

## 2018-02-01 NOTE — Progress Notes (Signed)
Urine sample for strep pneumo/urine culture has not been collected yet due to patient not urinating since arrive on the floor 2 hours ago. Will collect urine sample when patient produces urine. Penny Miller

## 2018-02-01 NOTE — Progress Notes (Signed)
CRITICAL VALUE ALERT  Critical Value:  Troponin 0.07  Date & Time Notied:  02/01/18 0141  Provider Notified: Paged floor coverage  Orders Received/Actions taken: Awaiting orders  Mariann Laster RN

## 2018-02-02 ENCOUNTER — Inpatient Hospital Stay (HOSPITAL_COMMUNITY): Payer: Medicare Other

## 2018-02-02 LAB — BASIC METABOLIC PANEL
Anion gap: 7 (ref 5–15)
BUN: 50 mg/dL — AB (ref 8–23)
CALCIUM: 8.7 mg/dL — AB (ref 8.9–10.3)
CO2: 25 mmol/L (ref 22–32)
Chloride: 105 mmol/L (ref 98–111)
Creatinine, Ser: 1.89 mg/dL — ABNORMAL HIGH (ref 0.44–1.00)
GFR calc Af Amer: 27 mL/min — ABNORMAL LOW (ref >60)
GFR, EST NON AFRICAN AMERICAN: 23 mL/min — AB (ref >60)
GLUCOSE: 173 mg/dL — AB (ref 70–99)
POTASSIUM: 3.9 mmol/L (ref 3.5–5.1)
SODIUM: 137 mmol/L (ref 135–145)

## 2018-02-02 LAB — CBC WITH DIFFERENTIAL/PLATELET
ABS IMMATURE GRANULOCYTES: 0.23 10*3/uL — AB (ref 0.00–0.07)
BASOS PCT: 0 %
Basophils Absolute: 0 10*3/uL (ref 0.0–0.1)
EOS ABS: 0 10*3/uL (ref 0.0–0.5)
EOS PCT: 0 %
HCT: 33.4 % — ABNORMAL LOW (ref 36.0–46.0)
Hemoglobin: 10.2 g/dL — ABNORMAL LOW (ref 12.0–15.0)
IMMATURE GRANULOCYTES: 1 %
LYMPHS ABS: 1.3 10*3/uL (ref 0.7–4.0)
LYMPHS PCT: 7 %
MCH: 29.3 pg (ref 26.0–34.0)
MCHC: 30.5 g/dL (ref 30.0–36.0)
MCV: 96 fL (ref 80.0–100.0)
Monocytes Absolute: 1.5 10*3/uL — ABNORMAL HIGH (ref 0.1–1.0)
Monocytes Relative: 8 %
NEUTROS ABS: 16 10*3/uL — AB (ref 1.7–7.7)
NEUTROS PCT: 84 %
PLATELETS: 149 10*3/uL — AB (ref 150–400)
RBC: 3.48 MIL/uL — AB (ref 3.87–5.11)
RDW: 16.8 % — AB (ref 11.5–15.5)
WBC: 19.2 10*3/uL — AB (ref 4.0–10.5)
nRBC: 0 % (ref 0.0–0.2)

## 2018-02-02 LAB — STREP PNEUMONIAE URINARY ANTIGEN: Strep Pneumo Urinary Antigen: NEGATIVE

## 2018-02-02 LAB — GLUCOSE, CAPILLARY
GLUCOSE-CAPILLARY: 251 mg/dL — AB (ref 70–99)
GLUCOSE-CAPILLARY: 264 mg/dL — AB (ref 70–99)
Glucose-Capillary: 141 mg/dL — ABNORMAL HIGH (ref 70–99)
Glucose-Capillary: 169 mg/dL — ABNORMAL HIGH (ref 70–99)

## 2018-02-02 LAB — MAGNESIUM: Magnesium: 1.9 mg/dL (ref 1.7–2.4)

## 2018-02-02 LAB — PROCALCITONIN: Procalcitonin: <0.1 ng/mL

## 2018-02-02 MED ORDER — IPRATROPIUM-ALBUTEROL 0.5-2.5 (3) MG/3ML IN SOLN
3.0000 mL | Freq: Three times a day (TID) | RESPIRATORY_TRACT | Status: DC
  Administered 2018-02-03 – 2018-02-04 (×6): 3 mL via RESPIRATORY_TRACT
  Filled 2018-02-02 (×7): qty 3

## 2018-02-02 MED ORDER — SENNOSIDES-DOCUSATE SODIUM 8.6-50 MG PO TABS
1.0000 | ORAL_TABLET | Freq: Two times a day (BID) | ORAL | Status: DC
  Administered 2018-02-02 – 2018-02-05 (×6): 1 via ORAL
  Filled 2018-02-02 (×7): qty 1

## 2018-02-02 MED ORDER — ATENOLOL 25 MG PO TABS
12.5000 mg | ORAL_TABLET | Freq: Two times a day (BID) | ORAL | Status: DC
  Administered 2018-02-02 – 2018-02-03 (×3): 12.5 mg via ORAL
  Filled 2018-02-02 (×4): qty 1

## 2018-02-02 MED ORDER — DOXYCYCLINE HYCLATE 100 MG PO TABS
100.0000 mg | ORAL_TABLET | Freq: Two times a day (BID) | ORAL | Status: DC
  Administered 2018-02-02 – 2018-02-05 (×6): 100 mg via ORAL
  Filled 2018-02-02 (×7): qty 1

## 2018-02-02 MED ORDER — SODIUM CHLORIDE 0.9 % IV SOLN
INTRAVENOUS | Status: AC
  Administered 2018-02-02: 18:00:00 via INTRAVENOUS

## 2018-02-02 MED ORDER — ATENOLOL 25 MG PO TABS: 12.5000 mg | ORAL_TABLET | Freq: Two times a day (BID) | ORAL | Status: DC

## 2018-02-02 NOTE — Progress Notes (Signed)
PROGRESS NOTE  Penny Miller AJO:[id number] DOB: 1931-07-06 DOA: 01/31/2018 PCP: [name], DO  HPI/Recap of past 24 hours:  Cough is dry, not able to cough up, report still wheezing, but a little less She is on 2liter o2, not in acute distress, denies chest pain  Cr worsening, she has poor appetite  Remain in afib, no tachycardia, has intermittent bradycardia     Assessment/Plan: Active Problems:   Essential hypertension   Embolic stroke involving left cerebellar artery (HCC)   Paroxysmal atrial fibrillation ([place])   Diabetic polyneuropathy associated with type 2 diabetes mellitus ([place])   Acute respiratory failure with hypoxia ([place])  Acute respiratory failure with hypoxia/URI/wheezing + rhinovirus -Initially required 4 L in the ED which was bumped up to 8 L. --cxr no acute findings on presentation, urine strep pneumo antigen negative, procalcitonin less than 0.01 -continue to have wheezing, will repeat cxr  -continue steroids, scheduled nebs, mucinex,  ivf due to poor oral intake, add on doxycycline for antiinflammatory effect  Leukocytosis: likely due to steroids, monitor  AKI on CKDIII - she denies urinary symptom, she does has n/v prior to admission, she continues to have poor appetite -cr worsening, renal US no obstructive uropathy -aki likely due to dehydration, continue ivf -repeat bmp in am -Renal dosing meds  Non insulin dependent dm2, uncontrolled, am blood glucose 173, likely due to stress and steroids use -on actos at home which is hold for now -recent a1c 7.7% -on ssi   Chronic afib Has afib/rvr in the ED Currently rate controlled has intermittent bradycardia, no chest pain, no edema -eliquis, atenolol with holding parameters  HTN: norvasc/atenolol  H/o CVA with mild right lower extremity weakness On eliquis/lipitor Reports still drives  Code Status: DNR  Family Communication: patient   Disposition Plan: remain in med tele  bed   Consultants:  none  Procedures:  none  Antibiotics:  levaquin x1 on 11/2  Doxycycline from 11/4   Objective: BP (!) 116/57 (BP Location: Left Arm)   Pulse (!) 50   Temp (!) 97.4 F (36.3 C) (Oral)   Resp 18   Ht 5\' 8"  (1.727 m)   Wt 79 kg   SpO2 99%   BMI 26.48 kg/m   Intake/Output Summary (Last 24 hours) at 02/02/2018 1314 Last data filed at 02/02/2018 0600 Gross per 24 hour  Intake 1545.86 ml  Output 400 ml  Net 1145.86 ml   Filed Weights   01/31/18 1927  Weight: 79 kg    Exam: Patient is examined daily including today on 02/02/2018, exams remain the same as of yesterday except that has changed    General:  NAD, appear more comfortable , but still +cough , + wheezing, able to talk in full sentences  Cardiovascular: IRRR  Respiratory: diffuse bilateral wheezing, slightly improved  Abdomen: Soft/ND/NT, positive BS  Musculoskeletal: No Edema  Neuro: alert, oriented   Data Reviewed: Basic Metabolic Panel: Recent Labs  Lab 01/31/18 1347 02/01/18 0613 02/02/18 0534  NA 138 137 137  K 3.3* 3.9 3.9  CL 103 104 105  CO2 24 24 25   GLUCOSE 174* 173* 173*  BUN 18 35* 50*  CREATININE 1.27* 1.78* 1.89*  CALCIUM 9.6 9.2 8.7*  MG  --   --  1.9   Liver Function Tests: Recent Labs  Lab 01/31/18 1347 02/01/18 0613  AST 18 16  ALT 14 12  ALKPHOS 100 74  BILITOT 0.7 0.5  PROT 7.7 6.3*  ALBUMIN 4.1 3.2*  No results for input(s): LIPASE, AMYLASE in the last 168 hours. No results for input(s): AMMONIA in the last 168 hours. CBC: Recent Labs  Lab 01/31/18 1347 02/01/18 0613 02/02/18 0534  WBC 9.4 16.2* 19.2*  NEUTROABS 8.6*  --  16.0*  HGB 12.6 10.4* 10.2*  HCT 40.7 33.3* 33.4*  MCV 94.2 95.1 96.0  PLT 170 155 149*   Cardiac Enzymes:   Recent Labs  Lab 01/31/18 1610 02/01/18 0019 02/01/18 0613  TROPONINI 0.03* 0.07* 0.06*   BNP (last 3 results) No results for input(s): BNP in the last 8760 hours.  ProBNP (last 3  results) No results for input(s): PROBNP in the last 8760 hours.  CBG: Recent Labs  Lab 02/01/18 1218 02/01/18 1629 02/01/18 2150 02/02/18 0832 02/02/18 1211  GLUCAP 188* 180* 208* 141* 251*    Recent Results (from the past 240 hour(s))  Blood Culture (routine x 2)     Status: None (Preliminary result)   Collection Time: 01/31/18  1:49 PM  Result Value Ref Range Status   Specimen Description BLOOD SPECIMEN SOURCE NOT MARKED ON REQUISITION  Final   Special Requests   Final    BOTTLES DRAWN AEROBIC AND ANAEROBIC Blood Culture results may not be optimal due to an excessive volume of blood received in culture bottles Performed at [facility], [place] [address], [place], [place] [address]    Culture   Final    NO GROWTH < 24 HOURS Performed at [facility] [address] [place], [place] [address]    Report Status PENDING  Incomplete  Blood Culture (routine x 2)     Status: None (Preliminary result)   Collection Time: 01/31/18  1:59 PM  Result Value Ref Range Status   Specimen Description   Final    BLOOD LEFT HAND Performed at [facility] [address], [place], [place] [address]    Special Requests   Final    BOTTLES DRAWN AEROBIC AND ANAEROBIC Blood Culture adequate volume Performed at [facility] [address] [place], [place] [address]    Culture   Final    NO GROWTH < 24 HOURS Performed at [facility] [address], [place], [place] [address]    Report Status PENDING  Incomplete  Respiratory Panel by PCR     Status: Abnormal   Collection Time: 01/31/18  1:59 PM  Result Value Ref Range Status   Adenovirus NOT DETECTED NOT DETECTED Final   Coronavirus 229E NOT DETECTED NOT DETECTED Final   Coronavirus HKU1 NOT DETECTED NOT DETECTED Final   Coronavirus NL63 NOT DETECTED NOT DETECTED Final   Coronavirus OC43 NOT DETECTED NOT DETECTED Final   Metapneumovirus NOT DETECTED NOT DETECTED Final    Rhinovirus / Enterovirus DETECTED (A) NOT DETECTED Final   Influenza A NOT DETECTED NOT DETECTED Final   Influenza B NOT DETECTED NOT DETECTED Final   Parainfluenza Virus 1 NOT DETECTED NOT DETECTED Final   Parainfluenza Virus 2 NOT DETECTED NOT DETECTED Final   Parainfluenza Virus 3 NOT DETECTED NOT DETECTED Final   Parainfluenza Virus 4 NOT DETECTED NOT DETECTED Final   Respiratory Syncytial Virus NOT DETECTED NOT DETECTED Final   Bordetella pertussis NOT DETECTED NOT DETECTED Final   Chlamydophila pneumoniae NOT DETECTED NOT DETECTED Final   Mycoplasma pneumoniae NOT DETECTED NOT DETECTED Final    Comment: Performed at [facility], [place] [address], [place],  [address]  MRSA PCR Screening  Status: None   Collection Time: 01/31/18  7:22 PM  Result Value Ref Range Status   MRSA by PCR NEGATIVE NEGATIVE Final    Comment:        The GeneXpert MRSA Assay (FDA approved for NASAL specimens only), is one component of a comprehensive MRSA colonization surveillance program. It is not intended to diagnose MRSA infection nor to guide or monitor treatment for MRSA infections. Performed at [facility], [place] [address], [place], [place] [address]      Studies: US Renal  Result Date: 02/02/2018 CLINICAL DATA:  Elevated creatinine EXAM: RENAL / URINARY TRACT ULTRASOUND COMPLETE COMPARISON:  None. FINDINGS: Right Kidney: Renal measurements: 8.2 x 4.8 x 5.1 cm = volume: 104 mL. Normal within normal limits. No mass or hydronephrosis (a tiny cortical echogenic focus is not convincing for mass lesion). Left Kidney: Renal measurements: 8.8 x 4.4 x 4.1 cm = volume: 82 mL. Echogenic homogeneous mass measuring 6 mm in the upper cortex. No hydronephrosis. Bladder: Lobulated bladder without evident wall thickening. IMPRESSION: 1. No hydronephrosis. 2. Small kidneys which may be related to patient's size rather than atrophy given lack of cortical thinning. 3. 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 Electronically Signed   By: Monte Fantasia M.D.   On: 02/02/2018 11:30    Scheduled Meds: . amLODipine  2.5 mg Oral Daily  . apixaban  2.5 mg Oral BID  . atenolol  12.5 mg Oral BID  . atorvastatin  40 mg Oral q1800  . citalopram  10 mg Oral Daily  . gabapentin  300 mg Oral BID  . guaiFENesin  600 mg Oral BID  . insulin aspart  0-9 Units Subcutaneous TID WC  . ipratropium-albuterol  3 mL Nebulization Q6H  . methylPREDNISolone (SOLU-MEDROL) injection  40 mg Intravenous Daily  . senna-docusate  1 tablet Oral BID    Continuous Infusions: . sodium chloride 75 mL/hr at 02/02/18 0448  . sodium chloride       Time spent: 23mins I have personally reviewed and interpreted on  02/02/2018 daily labs, tele strips, imagings as discussed above under date review session and assessment and plans.  I reviewed all nursing notes, pharmacy notes,  vitals, pertinent old records  I have discussed plan of care as described above with RN , patient on 02/02/2018   Florencia Reasons MD, PhD  [facility] Pager [phone]. If 7PM-7AM, please contact night-coverage at [email or web address], password [facility] 02/02/2018, 1:14 PM  LOS: 2 days

## 2018-02-02 NOTE — Evaluation (Signed)
Physical Therapy Evaluation Patient Details Name: Penny Miller MRN: [id number] DOB: 02/23/1932 Today's Date: 02/02/2018   History of Present Illness  84 yo female with onset of acute respiratory failure was admitted and referred to PT.  Has PAF, on O2 now at 2L from URI and having leukocytosis, caused by steroids.  PMHx:  embolic stroke of L cerebral artery, DM with polyneuropathy, URI.  Clinical Impression  Pt was able to work with PT and with removal of O2 could validate her use of O2 as sats dropped to 84% off O2 on hallway walk.  Pt is also having a moment of PAF with pulse 138 off O2.  Her plan is to follow up tomorrow and see if she has the same decline without O2 as well as making sure she can climb stairs for return home.  See as tolerated and refer to HHPT for follow up to continue rehab, as well as outpatient if needed for further cardiovascular conditioning.    Follow Up Recommendations Home health PT;Supervision for mobility/OOB    Equipment Recommendations  None recommended by PT    Recommendations for Other Services       Precautions / Restrictions Precautions Precautions: Fall(telemetry) Precaution Comments: has O2 via nasal cannula Restrictions Weight Bearing Restrictions: No      Mobility  Bed Mobility Overal bed mobility: Needs Assistance Bed Mobility: Supine to Sit;Sit to Supine     Supine to sit: Min assist Sit to supine: Min guard   General bed mobility comments: minor help to get up from bed at trunk and supervised back to bed  Transfers Overall transfer level: Needs assistance Equipment used: Rolling walker (2 wheeled) Transfers: Sit to/from Stand Sit to Stand: Min assist         General transfer comment: minor help to steady her to stand  Ambulation/Gait Ambulation/Gait assistance: Min guard Gait Distance (Feet): 75 Feet Assistive device: Rolling walker (2 wheeled);1 person hand held assist Gait Pattern/deviations: Step-to  pattern;Step-through pattern;Decreased stride length;Decreased stance time - right;Wide base of support Gait velocity: reduced Gait velocity interpretation: <1.31 ft/sec, indicative of household ambulator General Gait Details: turns widely on walker  Stairs            Wheelchair Mobility    Modified Rankin (Stroke Patients Only)       Balance Overall balance assessment: Needs assistance Sitting-balance support: Feet supported Sitting balance-Leahy Scale: Good     Standing balance support: Bilateral upper extremity supported;During functional activity Standing balance-Leahy Scale: Fair Standing balance comment: less than fair dynamic balance                             Pertinent Vitals/Pain Pain Assessment: No/denies pain    Home Living Family/patient expects to be discharged to:: Private residence Living Arrangements: Children Available Help at Discharge: Family;Available 24 hours/day Type of Home: House Home Access: Stairs to enter Entrance Stairs-Rails: Left Entrance Stairs-Number of Steps: 6 Home Layout: One level Home Equipment: Walker - 2 wheels;Cane - single point;Bedside commode Additional Comments: BSC is 3 in one which can go to shower    Prior Function Level of Independence: Independent         Comments: pt was walking with no AD just before this hospitalization     Hand Dominance   Dominant Hand: Right    Extremity/Trunk Assessment   Upper Extremity Assessment Upper Extremity Assessment: Overall WFL for tasks assessed    Lower Extremity Assessment  Lower Extremity Assessment: RLE deficits/detail RLE Deficits / Details: residual RLE stroke weakness RLE Coordination: decreased gross motor    Cervical / Trunk Assessment Cervical / Trunk Assessment: Normal  Communication   Communication: No difficulties  Cognition Arousal/Alertness: Awake/alert Behavior During Therapy: WFL for tasks assessed/performed Overall Cognitive  Status: Within Functional Limits for tasks assessed                                        General Comments General comments (skin integrity, edema, etc.): O2 sats were checked pregait and were 97%, then removed O2.  Pulses were controlled til after gait off O2, and noted 84% from post gait and brief 138 with pulse, settling at 77 and sat 87%.  O2 replaced and was 65 p with sat 97%    Exercises     Assessment/Plan    PT Assessment Patient needs continued PT services  PT Problem List Decreased range of motion;Decreased activity tolerance;Decreased balance;Decreased mobility;Decreased coordination;Decreased knowledge of use of DME;Decreased safety awareness;Cardiopulmonary status limiting activity       PT Treatment Interventions DME instruction;Gait training;Stair training;Functional mobility training;Therapeutic activities;Therapeutic exercise;Balance training;Neuromuscular re-education;Patient/family education    PT Goals (Current goals can be found in the Care Plan section)  Acute Rehab PT Goals Patient Stated Goal: to walk and feel stronger with better oxygen PT Goal Formulation: With patient/family Time For Goal Achievement: 02/16/18 Potential to Achieve Goals: Good    Frequency Min 4X/week   Barriers to discharge Inaccessible home environment home with 6 steps to enter house    Co-evaluation               AM-PAC PT "6 Clicks" Daily Activity  Outcome Measure Difficulty turning over in bed (including adjusting bedclothes, sheets and blankets)?: A Little Difficulty moving from lying on back to sitting on the side of the bed? : Unable Difficulty sitting down on and standing up from a chair with arms (e.g., wheelchair, bedside commode, etc,.)?: Unable Help needed moving to and from a bed to chair (including a wheelchair)?: A Little Help needed walking in hospital room?: A Little Help needed climbing 3-5 steps with a railing? : A Little 6 Click Score:  14    End of Session Equipment Utilized During Treatment: Gait belt;Oxygen Activity Tolerance: Patient tolerated treatment well;Treatment limited secondary to medical complications (Comment) Patient left: in bed;with call bell/phone within reach;with bed alarm set;with family/visitor present;with nursing/sitter in room Nurse Communication: Mobility status PT Visit Diagnosis: Unsteadiness on feet (R26.81);Difficulty in walking, not elsewhere classified (R26.2);Hemiplegia and hemiparesis Hemiplegia - Right/Left: Right Hemiplegia - dominant/non-dominant: Dominant Hemiplegia - caused by: Other cerebrovascular disease    Time: 1701-1735 PT Time Calculation (min) (ACUTE ONLY): 34 min   Charges:   PT Evaluation $PT Eval Moderate Complexity: 1 Mod PT Treatments $Gait Training: 8-22 mins        Ramond Dial 02/02/2018, 6:15 PM   Mee Hives, PT MS Acute Rehab Dept. Number: [place] and [place]

## 2018-02-03 LAB — CBC WITH DIFFERENTIAL/PLATELET
Abs Immature Granulocytes: 0.17 10*3/uL — ABNORMAL HIGH (ref 0.00–0.07)
BASOS ABS: 0 10*3/uL (ref 0.0–0.1)
BASOS PCT: 0 %
EOS ABS: 0 10*3/uL (ref 0.0–0.5)
EOS PCT: 0 %
HEMATOCRIT: 33.5 % — AB (ref 36.0–46.0)
Hemoglobin: 10.4 g/dL — ABNORMAL LOW (ref 12.0–15.0)
IMMATURE GRANULOCYTES: 1 %
Lymphocytes Relative: 11 %
Lymphs Abs: 1.5 10*3/uL (ref 0.7–4.0)
MCH: 29.7 pg (ref 26.0–34.0)
MCHC: 31 g/dL (ref 30.0–36.0)
MCV: 95.7 fL (ref 80.0–100.0)
Monocytes Absolute: 1.1 10*3/uL — ABNORMAL HIGH (ref 0.1–1.0)
Monocytes Relative: 8 %
NEUTROS PCT: 80 %
NRBC: 0 % (ref 0.0–0.2)
Neutro Abs: 11 10*3/uL — ABNORMAL HIGH (ref 1.7–7.7)
PLATELETS: 149 10*3/uL — AB (ref 150–400)
RBC: 3.5 MIL/uL — ABNORMAL LOW (ref 3.87–5.11)
RDW: 16.7 % — AB (ref 11.5–15.5)
WBC: 13.8 10*3/uL — ABNORMAL HIGH (ref 4.0–10.5)

## 2018-02-03 LAB — BASIC METABOLIC PANEL
ANION GAP: 7 (ref 5–15)
BUN: 50 mg/dL — ABNORMAL HIGH (ref 8–23)
CALCIUM: 8.4 mg/dL — AB (ref 8.9–10.3)
CO2: 23 mmol/L (ref 22–32)
CREATININE: 1.5 mg/dL — AB (ref 0.44–1.00)
Chloride: 108 mmol/L (ref 98–111)
GFR, EST AFRICAN AMERICAN: 36 mL/min — AB (ref >60)
GFR, EST NON AFRICAN AMERICAN: 31 mL/min — AB (ref >60)
GLUCOSE: 162 mg/dL — AB (ref 70–99)
Potassium: 3.8 mmol/L (ref 3.5–5.1)
Sodium: 138 mmol/L (ref 135–145)

## 2018-02-03 LAB — URINE CULTURE: Culture: <10000 — AB

## 2018-02-03 LAB — GLUCOSE, CAPILLARY
GLUCOSE-CAPILLARY: 286 mg/dL — AB (ref 70–99)
GLUCOSE-CAPILLARY: 186 mg/dL — AB (ref 70–99)
Glucose-Capillary: 159 mg/dL — ABNORMAL HIGH (ref 70–99)
Glucose-Capillary: 197 mg/dL — ABNORMAL HIGH (ref 70–99)

## 2018-02-03 LAB — PROCALCITONIN

## 2018-02-03 MED ORDER — SODIUM CHLORIDE 0.9 % IV SOLN
INTRAVENOUS | Status: DC
  Administered 2018-02-03 – 2018-02-04 (×2): via INTRAVENOUS

## 2018-02-03 MED ORDER — NYSTATIN 100000 UNIT/ML MT SUSP
5.0000 mL | Freq: Four times a day (QID) | OROMUCOSAL | Status: DC
  Administered 2018-02-03 – 2018-02-05 (×7): 500000 [IU] via ORAL
  Filled 2018-02-03 (×7): qty 5

## 2018-02-03 NOTE — Care Management Important Message (Signed)
Important Message  Patient Details  Name: Penny Miller MRN: [id number] Date of Birth: 1931/09/21   Medicare Important Message Given:  Yes    Kerin Salen 02/03/2018, 11:15 AMImportant Message  Patient Details  Name: Penny Miller MRN: [id number] Date of Birth: [birth year]   Medicare Important Message Given:  Yes    Kerin Salen 02/03/2018, 11:15 AM

## 2018-02-03 NOTE — Care Management Note (Signed)
Case Management Note  Patient Details  Name: Penny Miller MRN: [id number] Date of Birth: 11-28-1931  Subjective/Objective:Acute resp failure.From home w/Son.Has family support-grand dtr-[name].Noted PT recc HHPT.Provided patient who defers to grand dtr [name] left vm [phone]-await choice. Noted on 02-trying to wean off.                     Action/Plan:d/c home w/HHC.  Expected Discharge Date:  02/04/18               Expected Discharge Plan:  [place]  In-House Referral:     Discharge planning Services  CM Consult  Post Acute Care Choice:    Choice offered to:  Patient  DME Arranged:    DME Agency:     HH Arranged:    [place] Agency:     Status of Service:  In process, will continue to follow  If discussed at Long Length of Stay Meetings, dates discussed:    Additional Comments:  Dessa Phi, RN 02/03/2018, 1:51 PM

## 2018-02-03 NOTE — Progress Notes (Signed)
Patient ambulated on room air. O2 sat 95% at rest on 2L. Sat decreased to 86% after ambulating 24 ft. O2 sat r/t 92% on room air at rest.

## 2018-02-03 NOTE — Progress Notes (Addendum)
PROGRESS NOTE  Penny Miller EQA:[id number] DOB: 06-26-1931 DOA: 01/31/2018 PCP: [name], DO  Brief Summary:  medical history significant of Atrial fibrillation, diabetes mellitus, type 2, embolic stroke, hypertension, depression. Patient presents secondary to worsening shortness of breath over the last 5 days. 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 --cxr no acute findings on presentation, urine strep pneumo antigen negative, procalcitonin less than 0.01, blood culture no growth -continue to have wheezing,  repeat cxr no acute findings -continue steroids, scheduled nebs, mucinex,  ivf due to poor oral intake, add on doxycycline for antiinflammatory effect  Oral thrush: topical nystatin solution  Leukocytosis: likely due to steroids/stress, improving,  No fever  AKI on CKDIII - she denies urinary symptom, she does has n/v ,poor appetite prior to admission,  -renal US no obstructive uropathy -urine culture insignificant growth -aki likely due to dehydration, cr improved with ivf -cr peaked 1.89, coming down, continue ivf for another 24hrs -repeat bmp in am -Renal dosing meds  Non insulin dependent dm2, uncontrolled, am blood glucose 173, likely due to stress and steroids use -on actos at home  which is hold for now -recent a1c 7.7% -on ssi   Chronic afib Has afib/rvr in the ED Currently rate controlled has intermittent bradycardia, no chest pain, no edema -eliquis, atenolol with holding parameters  HTN: norvasc/atenolol  H/o CVA with mild right lower extremity weakness On eliquis/lipitor Reports still drives  Code Status: DNR  Family Communication: patient   Disposition Plan: remain in med tele bed, wean 02, monitor cr likley d/c in 1-2 days   Consultants:  none  Procedures:  none  Antibiotics:  levaquin x1 on 11/2  Doxycycline from 11/4   Objective: BP 134/66 (BP Location: Left Arm)   Pulse 64   Temp (!) 97.5 F (36.4 C) (Oral)   Resp 18   Ht 5\' 8"  (1.727 m)   Wt 79 kg   SpO2 95%   BMI 26.48 kg/m   Intake/Output Summary (Last 24 hours) at 02/03/2018 1207 Last data filed at 02/03/2018 0900 Gross per 24 hour  Intake 1233.28 ml  Output 700 ml  Net 533.28 ml   Filed Weights   01/31/18 1927  Weight: 79 kg    Exam: Patient is examined daily including today on 02/03/2018, exams remain the same as of yesterday except that has changed    General:  NAD, appear more comfortable , but still +cough , + wheezing, able to talk in full sentences  Cardiovascular: IRRR  Respiratory: diffuse bilateral wheezing continue to improve  Abdomen: Soft/ND/NT, positive BS  Musculoskeletal: No Edema  Neuro: alert, oriented   Data Reviewed: Basic Metabolic Panel: Recent Labs  Lab 01/31/18 1347 02/01/18 0613 02/02/18 0534 02/03/18 0539  NA 138 137 137 138  K 3.3* 3.9 3.9 3.8  CL 103 104 105 108  CO2 24 24 25 23   GLUCOSE 174* 173* 173* 162*  BUN 18 35* 50* 50*  CREATININE 1.27* 1.78* 1.89* 1.50*  CALCIUM 9.6 9.2 8.7* 8.4*  MG  --   --  1.9  --    Liver Function Tests: Recent Labs  Lab 01/31/18 1347 02/01/18 0613  AST 18 16  ALT 14 12  ALKPHOS 100 74  BILITOT 0.7 0.5  PROT 7.7 6.3*  ALBUMIN 4.1 3.2*   No results for input(s):  LIPASE, AMYLASE in the last 168 hours. No results for input(s): AMMONIA in the last 168 hours. CBC: Recent Labs  Lab 01/31/18 1347 02/01/18 0613 02/02/18 0534 02/03/18 0539  WBC 9.4 16.2* 19.2* 13.8*  NEUTROABS 8.6*  --  16.0* 11.0*  HGB 12.6 10.4* 10.2* 10.4*  HCT 40.7 33.3* 33.4* 33.5*  MCV 94.2 95.1 96.0 95.7  PLT 170 155 149* 149*   Cardiac Enzymes:   Recent Labs  Lab 01/31/18 1610 02/01/18 0019 02/01/18 0613  TROPONINI 0.03* 0.07* 0.06*   BNP (last 3 results) No results for input(s): BNP in the last 8760 hours.  ProBNP (last 3 results) No results for input(s): PROBNP in the last 8760 hours.  CBG: Recent Labs  Lab 02/02/18 1211 02/02/18 1730 02/02/18 2108 02/03/18 0736 02/03/18 1148  GLUCAP 251* 169* 264* 159* 197*    Recent Results (from the past 240 hour(s))  Blood Culture (routine x 2)     Status: None (Preliminary result)   Collection Time: 01/31/18  1:49 PM  Result Value Ref Range Status   Specimen Description BLOOD SPECIMEN SOURCE NOT MARKED ON REQUISITION  Final   Special Requests   Final    BOTTLES DRAWN AEROBIC AND ANAEROBIC Blood Culture results may not be optimal due to an excessive volume of blood received in culture bottles Performed at [facility], [place] [address] [place], [place] [address]    Culture   Final    NO GROWTH 2 DAYS Performed at [place] [address] [place], [place] [address]    Report Status PENDING  Incomplete  Blood Culture (routine x 2)     Status: None (Preliminary result)   Collection Time: 01/31/18  1:59 PM  Result Value Ref Range Status   Specimen Description   Final    BLOOD LEFT HAND Performed at [place] [address], [place], [place] [address]    Special Requests   Final    BOTTLES DRAWN AEROBIC AND ANAEROBIC Blood Culture adequate volume Performed at [place] [address], [place], [place] [address]    Culture   Final     NO GROWTH 2 DAYS Performed at [place] [address], [place], [place] [address]    Report Status PENDING  Incomplete  Respiratory Panel by PCR     Status: Abnormal   Collection Time: 01/31/18  1:59 PM  Result Value Ref Range Status   Adenovirus NOT DETECTED NOT DETECTED Final   Coronavirus 229E NOT DETECTED NOT DETECTED Final   Coronavirus HKU1 NOT DETECTED NOT DETECTED Final   Coronavirus NL63 NOT DETECTED NOT DETECTED Final   Coronavirus OC43 NOT DETECTED NOT DETECTED Final   Metapneumovirus NOT DETECTED NOT DETECTED Final   Rhinovirus / Enterovirus DETECTED (A) NOT DETECTED Final   Influenza A NOT DETECTED NOT DETECTED Final   Influenza B NOT DETECTED NOT DETECTED Final   Parainfluenza Virus 1 NOT  DETECTED NOT DETECTED Final   Parainfluenza Virus 2 NOT DETECTED NOT DETECTED Final   Parainfluenza Virus 3 NOT DETECTED NOT DETECTED Final   Parainfluenza Virus 4 NOT DETECTED NOT DETECTED Final   Respiratory Syncytial Virus NOT DETECTED NOT DETECTED Final   Bordetella pertussis NOT DETECTED NOT DETECTED Final   Chlamydophila pneumoniae NOT DETECTED NOT DETECTED Final   Mycoplasma pneumoniae NOT DETECTED NOT DETECTED Final    Comment: Performed at [facility], [place] [address], [place], [place] [address]  MRSA PCR Screening     Status: None   Collection Time: 01/31/18  7:22 PM  Result Value Ref Range Status   MRSA by PCR NEGATIVE NEGATIVE Final    Comment:        The GeneXpert MRSA Assay (FDA approved for NASAL specimens only), is one component of a comprehensive MRSA colonization surveillance program. It is not intended to diagnose MRSA infection nor to guide or monitor treatment for MRSA infections. Performed at [facility], [place] [address], [place], [place] [address]   Culture, Urine     Status: Abnormal   Collection Time: 02/02/18  5:57 AM  Result Value Ref Range Status   Specimen Description   Final    URINE, CLEAN CATCH Performed  at [facility], [place] [address] [place], [place] [address]    Special Requests   Final    NONE Performed at [facility], [place] [address] [place], [place] [address]    Culture (A)  Final    <10,000 COLONIES/mL INSIGNIFICANT GROWTH Performed at [place] [address], [place], [place] [address]    Report Status 02/03/2018 FINAL  Final     Studies: Dg Chest 2 View  Result Date: 02/02/2018 CLINICAL DATA:  Cough. EXAM: CHEST - 2 VIEW COMPARISON:  Chest x-ray dated January 31, 2018. FINDINGS: The heart size and mediastinal contours are within normal limits. Normal pulmonary vascularity. Atherosclerotic calcification of the aortic arch. No focal consolidation, pleural effusion, or pneumothorax. No acute osseous abnormality. Unchanged loop recorder. IMPRESSION: No active cardiopulmonary disease. Electronically Signed   By: Titus Dubin M.D.   On: 02/02/2018 15:23    Scheduled Meds: . amLODipine  2.5 mg Oral Daily  . apixaban  2.5 mg Oral BID  . atenolol  12.5 mg Oral BID  . atorvastatin  40 mg Oral q1800  . citalopram  10 mg Oral Daily  . doxycycline  100 mg Oral Q12H  . gabapentin  300 mg Oral BID  . guaiFENesin  600 mg Oral BID  . insulin aspart  0-9 Units Subcutaneous TID WC  . ipratropium-albuterol  3 mL Nebulization TID  . methylPREDNISolone (SOLU-MEDROL) injection  40 mg Intravenous Daily  . nystatin  5 mL Oral QID  . senna-docusate  1 tablet Oral BID    Continuous Infusions:    Time spent: 36mins I have personally reviewed and interpreted on  02/03/2018 daily labs, tele strips, imagings as discussed above under date review session and assessment and plans.  I reviewed all nursing notes, pharmacy notes,  vitals, pertinent old records  I have discussed plan of care as described above with RN , patient on 02/03/2018   Florencia Reasons MD, PhD  [facility] Pager [phone]. If 7PM-7AM, please contact night-coverage at  [email or web address], password [facility] 02/03/2018, 12:07 PM  LOS: 3 days

## 2018-02-04 DIAGNOSIS — J9601 Acute respiratory failure with hypoxia: Principal | ICD-10-CM

## 2018-02-04 LAB — BASIC METABOLIC PANEL
Anion gap: 6 (ref 5–15)
BUN: 44 mg/dL — ABNORMAL HIGH (ref 8–23)
CALCIUM: 8.2 mg/dL — AB (ref 8.9–10.3)
CO2: 23 mmol/L (ref 22–32)
CREATININE: 1.56 mg/dL — AB (ref 0.44–1.00)
Chloride: 109 mmol/L (ref 98–111)
GFR calc Af Amer: 34 mL/min — ABNORMAL LOW (ref >60)
GFR calc non Af Amer: 29 mL/min — ABNORMAL LOW (ref >60)
GLUCOSE: 162 mg/dL — AB (ref 70–99)
Potassium: 4.1 mmol/L (ref 3.5–5.1)
Sodium: 138 mmol/L (ref 135–145)

## 2018-02-04 LAB — GLUCOSE, CAPILLARY
GLUCOSE-CAPILLARY: 278 mg/dL — AB (ref 70–99)
Glucose-Capillary: 132 mg/dL — ABNORMAL HIGH (ref 70–99)
Glucose-Capillary: 159 mg/dL — ABNORMAL HIGH (ref 70–99)
Glucose-Capillary: 325 mg/dL — ABNORMAL HIGH (ref 70–99)

## 2018-02-04 LAB — CBC WITH DIFFERENTIAL/PLATELET
Abs Immature Granulocytes: 0.23 10*3/uL — ABNORMAL HIGH (ref 0.00–0.07)
Basophils Absolute: 0 10*3/uL (ref 0.0–0.1)
Basophils Relative: 0 %
EOS ABS: 0 10*3/uL (ref 0.0–0.5)
EOS PCT: 0 %
HEMATOCRIT: 33.8 % — AB (ref 36.0–46.0)
Hemoglobin: 10.4 g/dL — ABNORMAL LOW (ref 12.0–15.0)
Immature Granulocytes: 2 %
LYMPHS ABS: 1.7 10*3/uL (ref 0.7–4.0)
Lymphocytes Relative: 14 %
MCH: 29.8 pg (ref 26.0–34.0)
MCHC: 30.8 g/dL (ref 30.0–36.0)
MCV: 96.8 fL (ref 80.0–100.0)
MONO ABS: 1 10*3/uL (ref 0.1–1.0)
MONOS PCT: 9 %
Neutro Abs: 8.9 10*3/uL — ABNORMAL HIGH (ref 1.7–7.7)
Neutrophils Relative %: 75 %
Platelets: 146 10*3/uL — ABNORMAL LOW (ref 150–400)
RBC: 3.49 MIL/uL — ABNORMAL LOW (ref 3.87–5.11)
RDW: 16.6 % — AB (ref 11.5–15.5)
WBC: 12 10*3/uL — ABNORMAL HIGH (ref 4.0–10.5)
nRBC: 0 % (ref 0.0–0.2)

## 2018-02-04 LAB — MAGNESIUM: Magnesium: 1.9 mg/dL (ref 1.7–2.4)

## 2018-02-04 MED ORDER — ALBUTEROL SULFATE HFA 108 (90 BASE) MCG/ACT IN AERS: 2.0000 | INHALATION_SPRAY | Freq: Four times a day (QID) | RESPIRATORY_TRACT | 0 refills | Status: DC | PRN

## 2018-02-04 MED ORDER — NYSTATIN 100000 UNIT/ML MT SUSP: 5.0000 mL | Freq: Four times a day (QID) | OROMUCOSAL | 0 refills | Status: DC

## 2018-02-04 MED ORDER — ATENOLOL 25 MG PO TABS: 12.5000 mg | ORAL_TABLET | Freq: Every day | ORAL | 0 refills | Status: DC

## 2018-02-04 MED ORDER — DOXYCYCLINE HYCLATE 100 MG PO TABS: 100.0000 mg | ORAL_TABLET | Freq: Two times a day (BID) | ORAL | 0 refills | Status: AC

## 2018-02-04 MED ORDER — ATENOLOL 25 MG PO TABS
12.5000 mg | ORAL_TABLET | Freq: Every day | ORAL | Status: DC
  Administered 2018-02-05: 12.5 mg via ORAL
  Filled 2018-02-04: qty 1

## 2018-02-04 MED ORDER — IPRATROPIUM-ALBUTEROL 0.5-2.5 (3) MG/3ML IN SOLN: 3.0000 mL | Freq: Three times a day (TID) | RESPIRATORY_TRACT | 0 refills | Status: DC

## 2018-02-04 MED ORDER — PREDNISONE 20 MG PO TABS: 40.0000 mg | ORAL_TABLET | Freq: Every day | ORAL | 0 refills | Status: AC

## 2018-02-04 MED ORDER — GUAIFENESIN ER 600 MG PO TB12: 600.0000 mg | ORAL_TABLET | Freq: Two times a day (BID) | ORAL | 0 refills | Status: DC

## 2018-02-04 NOTE — Progress Notes (Signed)
Pt not to be DC'd today- Echo cannot be performed until tomorrow AM. MD [place] aware. Pt & granddaughter at bedside, also aware.

## 2018-02-04 NOTE — Care Management Note (Signed)
Case Management Note  Patient Details  Name: Penny Miller MRN: [id number] Date of Birth: Dec 10, 1931  Subjective/Objective:Patient/grand dtr able to private pay for home 02, & neb machine.[facility] will deliver home 02 travel tank, & neb machine tto rm prior d/c.  [facility] will manage delivery of home 02 to patient's home. No further CM needs.   Action/Plan:d/c home w/HHC/home 02,neb machine   Expected Discharge Date:  02/04/18               Expected Discharge Plan:  [place]  In-House Referral:     Discharge planning Services  CM Consult  Post Acute Care Choice:    Choice offered to:  Patient  DME Arranged:  Nebulizer machine, Oxygen DME Agency:  [place] Arranged:  RN, PT, Nurse's Aide [place] Agency:  [place]  Status of Service:  Completed, signed off  If discussed at [place] of Stay Meetings, dates discussed:    Additional Comments:  Dessa Phi, RN 02/04/2018, 4:07 PM

## 2018-02-04 NOTE — Progress Notes (Signed)
Grand dtr [name] on speaker phone while in rm with patient per patient request. Chose [facility] for [facility] notified. They cannot afford the pay out of pocket for neb machine or 02. [facility] will discuss with grand dtr the process for payment with insurance-billing primary & COB with all other additional insurances. MD aware.

## 2018-02-04 NOTE — Care Management Note (Signed)
Case Management Note  Patient Details  Name: Penny Miller MRN: [id number] Date of Birth: [birth year]  Subjective/Objective:Noted patient ordered for 02, nebs but has no qualifying dx-MD notified. Patient unable to afford. Awaiting choice of HHC for (HHPT) agency for grand dtr to choose.                  Action/Plan:d/c home w/HHC.   Expected Discharge Date:  02/04/18               Expected Discharge Plan:  [place]  In-House Referral:     Discharge planning Services  CM Consult  Post Acute Care Choice:    Choice offered to:  Patient  DME Arranged:    DME Agency:     HH Arranged:    [place] Agency:     Status of Service:  In process, will continue to follow  If discussed at Long Length of Stay Meetings, dates discussed:    Additional Comments:  Dessa Phi, RN 02/04/2018, 11:40 AM

## 2018-02-04 NOTE — Progress Notes (Signed)
SATURATION QUALIFICATIONS: (This note is used to comply with regulatory documentation for home oxygen)  Patient Saturations on Room Air at Rest = 93%  Patient Saturations on Room Air while Ambulating = 84%  Patient Saturations on 2 Liters of oxygen while Ambulating = 86%  Please briefly explain why patient needs home oxygen: Pt ambulated around 140 feet on 2L/[place]. O2 Sats decreased to 86% 2L/[place] but quickly increased to 93% RA when pt stopped walking to take a break.

## 2018-02-04 NOTE — Progress Notes (Addendum)
PROGRESS NOTE    Penny Miller  MHD:[id number] DOB: 07/27/1931 DOA: 01/31/2018 PCP: [name], DO    Brief Narrative:    Assessment & Plan:   Active Problems:   Essential hypertension   Embolic stroke involving left cerebellar artery (HCC)   Paroxysmal atrial fibrillation (HCC)   Diabetic polyneuropathy associated with type 2 diabetes mellitus ([place])   Acute respiratory failure with hypoxia (HCC)  1-Acute hypoxic respiratory failure, secondary to acute bronchitis, viral illness.  She was initilially requiring 4 L oxygen.  Concern with superimpose infection, was started on Doxy day 3.  Continue with IV solumedrol.  Nebulizer treatment.  Flutter valve.  She required oxygen for discharge but insurance wont cover and she is not able to afford oxygen.  Check echo.   2-Oral  thrush; Nystatin oral.   3-Leukocytosis; related to steroids.     4-AKI on CKD stage III;  [place] negative for obstruction.  DM, type 2;  SSI.   Acute on chronic A fib;  RVR on admission.  Bradycardia this am. Change atenolol to once a day.  Continue with eliquis.   HTN; continue with Norvasc.   H/O CVA; continue with eliquis, lipitor.   Elevation of troponin. Mild. Check echo.     Estimated body mass index is 26.48 kg/m as calculated from the following:   Height as of this encounter: 5\' 8"  (1.727 m).   Weight as of this encounter: 79 kg.   DVT prophylaxis: on eliquis.  Code Status: (DNR Family Communication: no family at bedside.  Disposition Plan: home when hypoxemia improved. She is not able to afford oxygen.  Consultants:   none  Procedures:  none  Antimicrobials:   Doxy 11-04   Subjective: She is breathing better than on admission, not at baseline. Congestion is getting loose.  She got dizzy off oxygen going to bathroom.   Objective: Vitals:   02/03/18 2019 02/03/18 2056 02/04/18 0420 02/04/18 0846  BP:  (!) 147/62 (!) 154/77   Pulse:  69 (!) 56   Resp:  14 16     Temp:  98.1 F (36.7 C) 97.6 F (36.4 C)   TempSrc:  Oral Oral   SpO2: 92% 96% 98% 95%  Weight:      Height:        Intake/Output Summary (Last 24 hours) at 02/04/2018 1358 Last data filed at 02/04/2018 1013 Gross per 24 hour  Intake 1796.66 ml  Output -  Net 1796.66 ml   Filed Weights   01/31/18 1927  Weight: 79 kg    Examination:  General exam: Appears calm and comfortable  Respiratory system: Bilateral ronchus, wheezing.  Cardiovascular system: S1 & S2 heard, RRR. No JVD, murmurs, rubs, gallops or clicks. No pedal edema. Gastrointestinal system: Abdomen is nondistended, soft and nontender. No organomegaly or masses felt. Normal bowel sounds heard. Central nervous system: Alert and oriented. No focal neurological deficits. Extremities: Symmetric 5 x 5 power. Skin: No rashes, lesions or ulcers .     Data Reviewed: I have personally reviewed following labs and imaging studies  CBC: Recent Labs  Lab 01/31/18 1347 02/01/18 0613 02/02/18 0534 02/03/18 0539 02/04/18 0553  WBC 9.4 16.2* 19.2* 13.8* 12.0*  NEUTROABS 8.6*  --  16.0* 11.0* 8.9*  HGB 12.6 10.4* 10.2* 10.4* 10.4*  HCT 40.7 33.3* 33.4* 33.5* 33.8*  MCV 94.2 95.1 96.0 95.7 96.8  PLT 170 155 149* 149* 211*   Basic Metabolic Panel: Recent Labs  Lab 01/31/18 1347 02/01/18  3419 02/02/18 0534 02/03/18 0539 02/04/18 0553  NA 138 137 137 138 138  K 3.3* 3.9 3.9 3.8 4.1  CL 103 104 105 108 109  CO2 24 24 25 23 23   GLUCOSE 174* 173* 173* 162* 162*  BUN 18 35* 50* 50* 44*  CREATININE 1.27* 1.78* 1.89* 1.50* 1.56*  CALCIUM 9.6 9.2 8.7* 8.4* 8.2*  MG  --   --  1.9  --  1.9   GFR: Estimated Creatinine Clearance: 29.6 mL/min (A) (by C-G formula based on SCr of 1.56 mg/dL (H)). Liver Function Tests: Recent Labs  Lab 01/31/18 1347 02/01/18 0613  AST 18 16  ALT 14 12  ALKPHOS 100 74  BILITOT 0.7 0.5  PROT 7.7 6.3*  ALBUMIN 4.1 3.2*   No results for input(s): LIPASE, AMYLASE in the last 168  hours. No results for input(s): AMMONIA in the last 168 hours. Coagulation Profile: No results for input(s): INR, PROTIME in the last 168 hours. Cardiac Enzymes: Recent Labs  Lab 01/31/18 1610 02/01/18 0019 02/01/18 0613  TROPONINI 0.03* 0.07* 0.06*   BNP (last 3 results) No results for input(s): PROBNP in the last 8760 hours. HbA1C: No results for input(s): HGBA1C in the last 72 hours. CBG: Recent Labs  Lab 02/03/18 1148 02/03/18 1644 02/03/18 2054 02/04/18 0739 02/04/18 1143  GLUCAP 197* 286* 186* 132* 159*   Lipid Profile: No results for input(s): CHOL, HDL, LDLCALC, TRIG, CHOLHDL, LDLDIRECT in the last 72 hours. Thyroid Function Tests: No results for input(s): TSH, T4TOTAL, FREET4, T3FREE, THYROIDAB in the last 72 hours. Anemia Panel: No results for input(s): VITAMINB12, FOLATE, FERRITIN, TIBC, IRON, RETICCTPCT in the last 72 hours. Sepsis Labs: Recent Labs  Lab 01/31/18 1357 02/02/18 0534 02/03/18 0539  PROCALCITON  --  <0.10 <0.10  LATICACIDVEN 1.82  --   --     Recent Results (from the past 240 hour(s))  Blood Culture (routine x 2)     Status: None (Preliminary result)   Collection Time: 01/31/18  1:49 PM  Result Value Ref Range Status   Specimen Description BLOOD SPECIMEN SOURCE NOT MARKED ON REQUISITION  Final   Special Requests   Final    BOTTLES DRAWN AEROBIC AND ANAEROBIC Blood Culture results may not be optimal due to an excessive volume of blood received in culture bottles Performed at [place] [address], [place], [place] [address]    Culture   Final    NO GROWTH 4 DAYS Performed at [facility], [place] [address] [place], [place] [address]    Report Status PENDING  Incomplete  Blood Culture (routine x 2)     Status: None (Preliminary result)   Collection Time: 01/31/18  1:59 PM  Result Value Ref Range Status   Specimen Description   Final    BLOOD LEFT HAND Performed at [place]  [address], [place], [place] [address]    Special Requests   Final    BOTTLES DRAWN AEROBIC AND ANAEROBIC Blood Culture adequate volume Performed at [place] [address] [place], [place] [address]    Culture   Final    NO GROWTH 4 DAYS Performed at [facility], [place] [address], [place],  [address]    Report Status PENDING  Incomplete  Respiratory Panel by PCR     Status: Abnormal   Collection Time: 01/31/18  1:59 PM  Result Value Ref Range Status   Adenovirus NOT DETECTED NOT  DETECTED Final   Coronavirus 229E NOT DETECTED NOT DETECTED Final   Coronavirus HKU1 NOT DETECTED NOT DETECTED Final   Coronavirus NL63 NOT DETECTED NOT DETECTED Final   Coronavirus OC43 NOT DETECTED NOT DETECTED Final   Metapneumovirus NOT DETECTED NOT DETECTED Final   Rhinovirus / Enterovirus DETECTED (A) NOT DETECTED Final   Influenza A NOT DETECTED NOT DETECTED Final   Influenza B NOT DETECTED NOT DETECTED Final   Parainfluenza Virus 1 NOT DETECTED NOT DETECTED Final   Parainfluenza Virus 2 NOT DETECTED NOT DETECTED Final   Parainfluenza Virus 3 NOT DETECTED NOT DETECTED Final   Parainfluenza Virus 4 NOT DETECTED NOT DETECTED Final   Respiratory Syncytial Virus NOT DETECTED NOT DETECTED Final   Bordetella pertussis NOT DETECTED NOT DETECTED Final   Chlamydophila pneumoniae NOT DETECTED NOT DETECTED Final   Mycoplasma pneumoniae NOT DETECTED NOT DETECTED Final    Comment: Performed at [facility], [address] [place], [place] [address]  MRSA PCR Screening     Status: None   Collection Time: 01/31/18  7:22 PM  Result Value Ref Range Status   MRSA by PCR NEGATIVE NEGATIVE Final    Comment:        The GeneXpert MRSA Assay (FDA approved for NASAL specimens only), is one component of a comprehensive MRSA colonization surveillance program. It is not intended to diagnose MRSA infection nor to guide or monitor treatment for MRSA infections. Performed at  [facility], [place] [address] [place], [place] [address]   Culture, Urine     Status: Abnormal   Collection Time: 02/02/18  5:57 AM  Result Value Ref Range Status   Specimen Description   Final    URINE, CLEAN CATCH Performed at [facility], [place] [address], [place], [place] [address]    Special Requests   Final    NONE Performed at [facility], [place] [address], [place], [place] [address]    Culture (A)  Final    <10,000 COLONIES/mL INSIGNIFICANT GROWTH Performed at [place] [address] [place], [place] [address]    Report Status 02/03/2018 FINAL  Final         Radiology Studies: Dg Chest 2 View  Result Date: 02/02/2018 CLINICAL DATA:  Cough. EXAM: CHEST - 2 VIEW COMPARISON:  Chest x-ray dated January 31, 2018. FINDINGS: The heart size and mediastinal contours are within normal limits. Normal pulmonary vascularity. Atherosclerotic calcification of the aortic arch. No focal consolidation, pleural effusion, or pneumothorax. No acute osseous abnormality. Unchanged loop recorder. IMPRESSION: No active cardiopulmonary disease. Electronically Signed   By: Titus Dubin M.D.   On: 02/02/2018 15:23        Scheduled Meds: . amLODipine  2.5 mg Oral Daily  . apixaban  2.5 mg Oral BID  . [START ON 02/05/2018] atenolol  12.5 mg Oral Daily  . atorvastatin  40 mg Oral q1800  . citalopram  10 mg Oral Daily  . doxycycline  100 mg Oral Q12H  . gabapentin  300 mg Oral BID  . guaiFENesin  600 mg Oral BID  . insulin aspart  0-9 Units Subcutaneous TID WC  . ipratropium-albuterol  3 mL Nebulization TID  . methylPREDNISolone (SOLU-MEDROL) injection  40 mg Intravenous Daily  . nystatin  5 mL Oral QID  . senna-docusate  1 tablet Oral BID   Continuous Infusions:   LOS: 4 days    Time spent: 35 minutes.  Elmarie Shiley, MD [facility] Pager [phone]  If 7PM-7AM, please contact  night-coverage [email or web address] Password [email or web address] 02/04/2018, 1:58 PM

## 2018-02-04 NOTE — Care Management Note (Signed)
Case Management Note  Patient Details  Name: CHERAY PARDI MRN: [id number] Date of Birth: 1932/02/26  Subjective/Objective:                    Action/Plan:   Expected Discharge Date:  02/04/18               Expected Discharge Plan:  [place]  In-House Referral:     Discharge planning Services  CM Consult  Post Acute Care Choice:    Choice offered to:  Patient  DME Arranged:    DME Agency:     HH Arranged:  RN, PT, Nurse's Aide [place] Agency:  [place]  Status of Service:  In process, will continue to follow  If discussed at Long Length of Stay Meetings, dates discussed:    Additional Comments:  Dessa Phi, RN 02/04/2018, 12:18 PM

## 2018-02-05 ENCOUNTER — Inpatient Hospital Stay (HOSPITAL_COMMUNITY): Payer: Medicare Other

## 2018-02-05 DIAGNOSIS — I340 Nonrheumatic mitral (valve) insufficiency: Secondary | ICD-10-CM

## 2018-02-05 DIAGNOSIS — I361 Nonrheumatic tricuspid (valve) insufficiency: Secondary | ICD-10-CM

## 2018-02-05 LAB — CULTURE, BLOOD (ROUTINE X 2)
Culture: NO GROWTH
Culture: NO GROWTH
Special Requests: ADEQUATE

## 2018-02-05 LAB — BASIC METABOLIC PANEL
Anion gap: 6 (ref 5–15)
BUN: 38 mg/dL — AB (ref 8–23)
CO2: 24 mmol/L (ref 22–32)
CREATININE: 1.39 mg/dL — AB (ref 0.44–1.00)
Calcium: 8.4 mg/dL — ABNORMAL LOW (ref 8.9–10.3)
Chloride: 108 mmol/L (ref 98–111)
GFR calc Af Amer: 39 mL/min — ABNORMAL LOW (ref >60)
GFR, EST NON AFRICAN AMERICAN: 34 mL/min — AB (ref >60)
Glucose, Bld: 161 mg/dL — ABNORMAL HIGH (ref 70–99)
POTASSIUM: 3.8 mmol/L (ref 3.5–5.1)
Sodium: 138 mmol/L (ref 135–145)

## 2018-02-05 LAB — CBC
HCT: 34.1 % — ABNORMAL LOW (ref 36.0–46.0)
Hemoglobin: 10.3 g/dL — ABNORMAL LOW (ref 12.0–15.0)
MCH: 29.5 pg (ref 26.0–34.0)
MCHC: 30.2 g/dL (ref 30.0–36.0)
MCV: 97.7 fL (ref 80.0–100.0)
PLATELETS: 157 10*3/uL (ref 150–400)
RBC: 3.49 MIL/uL — ABNORMAL LOW (ref 3.87–5.11)
RDW: 16.6 % — ABNORMAL HIGH (ref 11.5–15.5)
WBC: 11.8 10*3/uL — AB (ref 4.0–10.5)
nRBC: 0.2 % (ref 0.0–0.2)

## 2018-02-05 LAB — ECHOCARDIOGRAM COMPLETE
HEIGHTINCHES: 68 in
Weight: 2786.61 oz

## 2018-02-05 LAB — GLUCOSE, CAPILLARY
Glucose-Capillary: 138 mg/dL — ABNORMAL HIGH (ref 70–99)
Glucose-Capillary: 156 mg/dL — ABNORMAL HIGH (ref 70–99)

## 2018-02-05 NOTE — Progress Notes (Signed)
PHYSICAL THERAPY  Pt was assisted to bathroom by nursing student on RA.  I checked sats while in bathroom sats 95% at rest HR 67.  Assisted with amb pt > 100 feet with walker on RA sats ranged from briefly 84% but with good recovery to 90% with instruction/education on purse lip breathing.  Pt tolerated distance well with only 1/4 dyspnea.  Encouraged coughing and reinforced purse lip breathing education using Teach Back and demonstration.  Returned to room back to bed RA avg 96% and noted increased cough which I instructed pt to encourage.    SATURATION QUALIFICATIONS: (This note is used to comply with regulatory documentation for home oxygen)  Patient Saturations on Room Air at Rest = 95%  Patient Saturations on Room Air while Ambulating > 100 feet = (84 - 94%) avg 90% avg HR 69  Patient Saturations on     0    Liters of oxygen while Ambulating   Please briefly explain why patient needs home oxygen: pt did not require supplemental oxygen during activity instead required education on deep purse lip breathing and coughing.  Pt wants to be able to get back to her prior mobility level "keep up with my grandchildren" and not have to use a walker.  Rica Koyanagi  PTA Acute  Rehabilitation Services Pager      [phone] Office      [phone]

## 2018-02-05 NOTE — Progress Notes (Signed)
  Echocardiogram 2D Echocardiogram has been performed.  Penny Miller 02/05/2018, 9:07 AM

## 2018-02-05 NOTE — Progress Notes (Signed)
Physical Therapy Treatment Patient Details Name: Penny Miller MRN: [id number] DOB: 01-31-1932 Today's Date: 02/05/2018    History of Present Illness 84 yo female with onset of acute respiratory failure was admitted and referred to PT.  Has PAF, on O2 now at 2L from URI and having leukocytosis, caused by steroids.  PMHx:  embolic stroke of L cerebral artery, DM with polyneuropathy, URI.    PT Comments    Assisted OOB to amb to bathroom and in hallway trial RA. See previous note.   Follow Up Recommendations  Home health PT;Supervision for mobility/OOB     Equipment Recommendations  None recommended by PT    Recommendations for Other Services       Precautions / Restrictions Precautions Precautions: Fall Precaution Comments: monitor sats Restrictions Weight Bearing Restrictions: No    Mobility  Bed Mobility Overal bed mobility: Needs Assistance Bed Mobility: Supine to Sit;Sit to Supine     Supine to sit: Supervision;Min guard Sit to supine: Supervision;Min guard   General bed mobility comments: minor help to get up from bed at trunk and supervised back to bed  Transfers Overall transfer level: Needs assistance Equipment used: Rolling walker (2 wheeled) Transfers: Sit to/from Stand Sit to Stand: Min guard;Supervision         General transfer comment: 25% VC's on safety with turns   Ambulation/Gait Ambulation/Gait assistance: Min guard Gait Distance (Feet): 95 Feet Assistive device: Rolling walker (2 wheeled);1 person hand held assist Gait Pattern/deviations: Step-to pattern;Step-through pattern;Decreased stride length;Decreased stance time - right;Wide base of support Gait velocity: decreased   General Gait Details: 25% VC's on safety and purse lip breathing    Stairs             Wheelchair Mobility    Modified Rankin (Stroke Patients Only)       Balance                                            Cognition  Arousal/Alertness: Awake/alert Behavior During Therapy: WFL for tasks assessed/performed Overall Cognitive Status: Within Functional Limits for tasks assessed                                        Exercises      General Comments        Pertinent Vitals/Pain Pain Assessment: No/denies pain    Home Living                      Prior Function            PT Goals (current goals can now be found in the care plan section) Progress towards PT goals: Progressing toward goals    Frequency    Min 3X/week      PT Plan Frequency needs to be updated    Co-evaluation              AM-PAC PT "6 Clicks" Daily Activity  Outcome Measure  Difficulty turning over in bed (including adjusting bedclothes, sheets and blankets)?: A Little Difficulty moving from lying on back to sitting on the side of the bed? : A Little Difficulty sitting down on and standing up from a chair with arms (e.g., wheelchair, bedside commode, etc,.)?: A Little Help needed moving to and  from a bed to chair (including a wheelchair)?: A Little Help needed walking in hospital room?: A Little Help needed climbing 3-5 steps with a railing? : A Little 6 Click Score: 18    End of Session Equipment Utilized During Treatment: Gait belt;Oxygen Activity Tolerance: Patient tolerated treatment well;Treatment limited secondary to medical complications (Comment) Patient left: in bed;with call bell/phone within reach;with bed alarm set;with nursing/sitter in room Nurse Communication: Mobility status PT Visit Diagnosis: Unsteadiness on feet (R26.81);Difficulty in walking, not elsewhere classified (R26.2);Hemiplegia and hemiparesis Hemiplegia - Right/Left: Right     Time: 5750-5183 PT Time Calculation (min) (ACUTE ONLY): 31 min  Charges:  $Gait Training: 8-22 mins $Therapeutic Activity: 8-22 mins                     Rica Koyanagi  PTA Acute  Rehabilitation Services Pager       [phone] Office      [phone]

## 2018-02-05 NOTE — Discharge Summary (Signed)
Physician Discharge Summary  BRYTTANI BLEW OXB:[id number] DOB: [birth year] DOA: 01/31/2018  PCP: [name], DO  Admit date: 01/31/2018 Discharge date: 02/05/2018  Admitted From: Home  Disposition: Home   Recommendations for Outpatient Follow-up:  1. Follow up with PCP in 1-2 weeks 2. Please obtain BMP/CBC in one week 3. Needs pulmonary function test. Needs further evaluation for pulmonary hypertension.  4. Needs referral to pulmonology   Home Health: yes  Discharge Condition: stable.  CODE STATUS: full code Diet recommendation: Heart Healthy  Brief/Interim Summary: Brief Narrative:  SHONNIE Miller is a 84 y.o. female with medical history significant of Atrial fibrillation, diabetes mellitus, type 2, embolic stroke, hypertension, depression. Patient presents secondary to worsening shortness of breath over the last 5 days. She reports her son as having similar symptoms prior to her symptoms starting. IN addition to shortness of breath, she has had a cough and "head cold." She has the feeling of congestion but has not been able to produce and phlegm. No fevers, but has had nausea and vomiting. She took some doxycycline (she had left over pills) which did not help with her symptoms.   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  She was initilially requiring 4 L oxygen. Now on 2 L.  Concern with superimpose infection, was started on Doxy day 3. Discharge on 2 more days of doxy.  Continue with IV solumedrol. Discharge on prednisone.  Nebulizer treatment.  Flutter valve.  She will be able to afford oxygen. Plan to discharge today ECHO showed normal EF, increase  pulmonary HTN. Will need follow up and evaluation fro pulmonary HTN   2-Oral  thrush; Nystatin oral.   3-Leukocytosis; related to steroids.     4-AKI on CKD stage III;  [place] negative for obstruction.  DM, type 2;  SSI.   Acute on chronic A fib;  RVR on admission.  Bradycardia this am. Change atenolol to once a day.  Continue with eliquis.   HTN; continue with Norvasc.   H/O CVA; continue with eliquis, lipitor.   Elevation of troponin. Mild. Check echo.     Discharge Diagnoses:  Active Problems:   Essential hypertension   Embolic stroke involving left cerebellar artery (HCC)   Paroxysmal atrial fibrillation (HCC)   Diabetic polyneuropathy associated with type 2 diabetes mellitus ([place])   Acute respiratory failure with hypoxia [facility])    Discharge Instructions  Discharge Instructions    Diet - low sodium heart healthy   Complete by:  As directed    Diet - low sodium heart healthy   Complete by:  As directed    Increase activity slowly   Complete by:  As directed    Increase activity slowly   Complete by:  As directed      Allergies as of 02/05/2018      Reactions   Jardiance [empagliflozin] Diarrhea   Metformin And Related Diarrhea   Ceclor [cefaclor] Other (See Comments)   Unknown reaction; not recalled   Codeine Nausea And Vomiting   Lopid [gemfibrozil] Other (See Comments)   Unknown reaction; not recalled   Metoprolol Nausea And Vomiting   Morphine And Related Other (See Comments)   Passed out   Niacin  And Related Other (See Comments)   Unknown reaction; not recalled   Relafen [nabumetone] Nausea And Vomiting   Septra [sulfamethoxazole-trimethoprim] Nausea And Vomiting   Tetracycline Nausea And Vomiting   Toprol Xl [metoprolol Tartrate] Nausea And Vomiting   Adhesive [tape] Rash, Other (See Comments)   Tears the skin also      Medication List    STOP taking these medications   meclizine 25 MG tablet Commonly known as:  ANTIVERT     TAKE  these medications   accu-chek multiclix lancets Use to test blood sugar twice daily. E11.29   albuterol 108 (90 Base) MCG/ACT inhaler Commonly known as:  PROVENTIL HFA;VENTOLIN HFA Inhale 2 puffs into the lungs every 6 (six) hours as needed for wheezing or shortness of breath.   amLODipine 2.5 MG tablet Commonly known as:  NORVASC Take one tablet twice daily for blood pressure What changed:    how much to take  how to take this  when to take this  additional instructions   apixaban 5 MG Tabs tablet Commonly known as:  ELIQUIS Take 1 tablet (5 mg total) by mouth 2 (two) times daily.   atenolol 25 MG tablet Commonly known as:  TENORMIN Take 0.5 tablets (12.5 mg total) by mouth daily. What changed:    medication strength  how much to take  how to take this  when to take this  additional instructions   atorvastatin 40 MG tablet Commonly known as:  LIPITOR Take 1 tablet (40 mg total) by mouth daily at 6 PM.   citalopram 10 MG tablet Commonly known as:  CELEXA Take 1 tablet (10 mg total) by mouth daily.   doxycycline 100 MG tablet Commonly known as:  VIBRA-TABS Take 1 tablet (100 mg total) by mouth every 12 (twelve) hours for 2 days. What changed:  when to take this   gabapentin 300 MG capsule Commonly known as:  NEURONTIN Take 1 capsule (300 mg total) by mouth 2 (two) times daily.   glucose blood test strip Use to test blood sugar twice daily. Dx: E11.29   guaiFENesin 600 MG 12 hr tablet Commonly known as:  MUCINEX Take 1 tablet (600 mg total) by mouth 2 (two) times daily.   ipratropium-albuterol 0.5-2.5 (3) MG/3ML Soln Commonly known as:  DUONEB Take 3 mLs by nebulization 3 (three) times daily.   nystatin 100000 UNIT/ML suspension Commonly known as:  MYCOSTATIN Take 5 mLs (500,000 Units total) by mouth 4 (four) times daily.   pioglitazone 15 MG tablet Commonly known as:  ACTOS Take 1 tablet (15 mg total) by mouth daily.   predniSONE 20 MG  tablet Commonly known as:  DELTASONE Take 2 tablets (40 mg total) by mouth daily for 5 days.   Vitamin D (Ergocalciferol) 1.25 MG (50000 UT) Caps capsule Commonly known as:  DRISDOL TAKE 1 CAPSULE WEEKLY FOR  VITAMIN D What changed:  See the new instructions.            Durable Medical Equipment  (From admission, onward)         Start     Ordered   02/04/18 1122  For home use only DME oxygen  Once    Question Answer Comment  Mode or (Route) Nasal cannula   Liters per Minute 2   Frequency Continuous (stationary and portable oxygen unit needed)   Oxygen delivery system Gas      02/04/18 1122   02/04/18 1040  For home use only DME Nebulizer machine  Once    Question:  Patient needs a nebulizer to treat with the following condition  Answer:  Bronchospasm, acute   02/04/18 1040         Follow-up Information    Reed, [name] L, DO Follow up.   Specialty:  Geriatric Medicine Contact information: [place]. [place] [address] [phone]        [name], MD Follow up.   Specialty:  Cardiology Contact information: [address] [address] [phone]        [facility] Follow up in 3 week(s).   Specialty:  Pulmonology Why:  h/o cigarette smoking in the past, h/o acid reflux, has been having inttermittent wheezing for the last few months to two yrs. consider lung function test. Contact information: [place] Follow up.   Specialty:  Home Health Services Why:  [facility] nursing/physical therapy/aide Contact information: [place] [address] [place] Follow up.   Why:  home oxygen, neb machine Contact information: [place] [address] [phone]          Allergies  Allergen Reactions  . Jardiance [Empagliflozin]  Diarrhea  . Metformin And Related Diarrhea  . Ceclor [Cefaclor] Other (See Comments)    Unknown reaction; not recalled  . Codeine Nausea And Vomiting  . Lopid [Gemfibrozil] Other (See Comments)    Unknown reaction; not recalled  . Metoprolol Nausea And Vomiting  . Morphine And Related Other (See Comments)    Passed out  . Niacin And Related Other (See Comments)    Unknown reaction; not recalled  . Relafen [Nabumetone] Nausea And Vomiting  . Septra [Sulfamethoxazole-Trimethoprim] Nausea And Vomiting  . Tetracycline Nausea And Vomiting  . Toprol Xl [Metoprolol Tartrate] Nausea And Vomiting  . Adhesive [Tape] Rash and Other (See Comments)    Tears the skin also    Consultations:  none   Procedures/Studies: Dg Chest 2 View  Result Date: 02/02/2018 CLINICAL DATA:  Cough. EXAM: CHEST - 2 VIEW COMPARISON:  Chest x-ray dated January 31, 2018. FINDINGS: The heart size and mediastinal contours are within normal limits. Normal pulmonary vascularity. Atherosclerotic calcification of the aortic arch. No focal consolidation, pleural effusion, or pneumothorax. No acute osseous abnormality. Unchanged loop recorder. IMPRESSION: No active cardiopulmonary disease. Electronically Signed   By: Titus Dubin M.D.   On: 02/02/2018 15:23   Dg Chest 2 View  Result Date: 01/31/2018 CLINICAL DATA:  Shortness of breath and cough for the past 3 days. EXAM: CHEST - 2 VIEW COMPARISON:  Chest x-ray dated December 15, 2017. FINDINGS: The heart remains at the upper limits of normal in size. Normal pulmonary vascularity. Atherosclerotic calcification of the aortic arch. No focal consolidation, pleural effusion, or pneumothorax. No acute osseous abnormality. Unchanged loop recorder. IMPRESSION: No active cardiopulmonary disease. Electronically Signed   By: Titus Dubin M.D.   On: 01/31/2018 13:25   US Renal  Result Date: 02/02/2018 CLINICAL DATA:  Elevated creatinine EXAM: RENAL / URINARY TRACT ULTRASOUND  COMPLETE COMPARISON:  None. FINDINGS: Right Kidney: Renal measurements: 8.2 x 4.8 x 5.1 cm = volume: 104 mL. Normal within normal limits. No mass or hydronephrosis (a tiny cortical echogenic focus is not convincing for mass lesion). Left Kidney: Renal measurements: 8.8 x 4.4 x 4.1 cm =  volume: 82 mL. Echogenic homogeneous mass measuring 6 mm in the upper cortex. No hydronephrosis. Bladder: Lobulated bladder without evident wall thickening. IMPRESSION: 1. No hydronephrosis. 2. Small kidneys which may be related to patient's size rather than atrophy given lack of cortical thinning. 3. 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 Electronically Signed   By: Monte Fantasia M.D.   On: 02/02/2018 11:30     Subjective: She is feeling better, cough improved  Discharge Exam: Vitals:   02/05/18 0551 02/05/18 0918  BP: (!) 153/71 (!) 130/59  Pulse: (!) 55 (!) 58  Resp: 18   Temp:    SpO2: 96%    Vitals:   02/04/18 2113 02/04/18 2128 02/05/18 0551 02/05/18 0918  BP:  138/63 (!) 153/71 (!) 130/59  Pulse:  60 (!) 55 (!) 58  Resp:  18 18   Temp:  (!) 97.5 F (36.4 C)    TempSrc:  Oral    SpO2: 97% 96% 96%   Weight:      Height:        General: Pt is alert, awake, not in acute distress Cardiovascular: RRR, S1/S2 +, no rubs, no gallops Respiratory: CTA bilaterally, no wheezing, no rhonchi Abdominal: Soft, NT, ND, bowel sounds + Extremities: no edema, no cyanosis    The results of significant diagnostics from this hospitalization (including imaging, microbiology, ancillary and laboratory) are listed below for reference.     Microbiology: Recent Results (from the past 240 hour(s))  Blood Culture (routine x 2)     Status: None   Collection Time: 01/31/18  1:49 PM  Result Value Ref Range Status   Specimen Description BLOOD SPECIMEN SOURCE NOT MARKED ON REQUISITION  Final   Special Requests   Final    BOTTLES DRAWN AEROBIC AND ANAEROBIC Blood Culture results may not  be optimal due to an excessive volume of blood received in culture bottles Performed at [facility] [address], [place], [place] [address]    Culture   Final    NO GROWTH 5 DAYS Performed at [facility], [place] [address], [place], [place] [address]    Report Status 02/05/2018 FINAL  Final  Blood Culture (routine x 2)     Status: None   Collection Time: 01/31/18  1:59 PM  Result Value Ref Range Status   Specimen Description   Final    BLOOD LEFT HAND Performed at [facility] [address], [place], [place] [address]    Special Requests   Final    BOTTLES DRAWN AEROBIC AND ANAEROBIC Blood Culture adequate volume Performed at [facility] [address], [place], [place] [address]    Culture   Final    NO GROWTH 5 DAYS Performed at [facility], [place] [address] [place],  [address]    Report Status 02/05/2018 FINAL  Final  Respiratory Panel by PCR     Status: Abnormal   Collection Time: 01/31/18  1:59 PM  Result Value Ref Range Status   Adenovirus NOT DETECTED NOT DETECTED Final   Coronavirus 229E NOT DETECTED NOT DETECTED Final   Coronavirus HKU1 NOT DETECTED NOT DETECTED Final   Coronavirus NL63 NOT DETECTED NOT DETECTED Final   Coronavirus OC43 NOT DETECTED NOT DETECTED Final   Metapneumovirus NOT DETECTED NOT DETECTED Final   Rhinovirus / Enterovirus DETECTED (A) NOT DETECTED Final   Influenza A NOT DETECTED NOT DETECTED Final   Influenza B  NOT DETECTED NOT DETECTED Final   Parainfluenza Virus 1 NOT DETECTED NOT DETECTED Final   Parainfluenza Virus 2 NOT DETECTED NOT DETECTED Final   Parainfluenza Virus 3 NOT DETECTED NOT DETECTED Final   Parainfluenza Virus 4 NOT DETECTED NOT DETECTED Final   Respiratory Syncytial Virus NOT DETECTED NOT DETECTED Final   Bordetella pertussis NOT DETECTED NOT DETECTED Final   Chlamydophila pneumoniae NOT DETECTED NOT DETECTED Final   Mycoplasma pneumoniae  NOT DETECTED NOT DETECTED Final    Comment: Performed at [facility], [place] [address], [place], [place] [address]  MRSA PCR Screening     Status: None   Collection Time: 01/31/18  7:22 PM  Result Value Ref Range Status   MRSA by PCR NEGATIVE NEGATIVE Final    Comment:        The GeneXpert MRSA Assay (FDA approved for NASAL specimens only), is one component of a comprehensive MRSA colonization surveillance program. It is not intended to diagnose MRSA infection nor to guide or monitor treatment for MRSA infections. Performed at [facility], [place] [address], [place], [place] [address]   Culture, Urine     Status: Abnormal   Collection Time: 02/02/18  5:57 AM  Result Value Ref Range Status   Specimen Description   Final    URINE, CLEAN CATCH Performed at [facility], [place] [address], [place], [place] [address]    Special Requests   Final    NONE Performed at [facility], [place] [address], [place], [place] [address]    Culture (A)  Final    <10,000 COLONIES/mL INSIGNIFICANT GROWTH Performed at [facility] [address], [place], [place] [address]    Report Status 02/03/2018 FINAL  Final     Labs: BNP (last 3 results) No results for input(s): BNP in the last 8760 hours. Basic Metabolic Panel: Recent Labs  Lab 02/01/18 0613 02/02/18 0534 02/03/18 0539 02/04/18 0553 02/05/18 0612  NA 137 137 138 138 138  K 3.9 3.9 3.8 4.1 3.8  CL 104 105 108 109 108  CO2 24 25 23 23 24   GLUCOSE 173* 173* 162* 162* 161*  BUN 35* 50* 50* 44* 38*  CREATININE 1.78* 1.89* 1.50* 1.56* 1.39*  CALCIUM 9.2 8.7* 8.4* 8.2* 8.4*  MG  --  1.9  --  1.9  --    Liver Function Tests: Recent Labs  Lab 01/31/18 1347 02/01/18 0613  AST 18 16  ALT 14 12  ALKPHOS 100 74  BILITOT 0.7 0.5  PROT 7.7 6.3*  ALBUMIN 4.1 3.2*   No results for input(s): LIPASE, AMYLASE in the last 168 hours. No results for input(s): AMMONIA in the  last 168 hours. CBC: Recent Labs  Lab 01/31/18 1347 02/01/18 0613 02/02/18 0534 02/03/18 0539 02/04/18 0553 02/05/18 0612  WBC 9.4 16.2* 19.2* 13.8* 12.0* 11.8*  NEUTROABS 8.6*  --  16.0* 11.0* 8.9*  --   HGB 12.6 10.4* 10.2* 10.4* 10.4* 10.3*  HCT 40.7 33.3* 33.4* 33.5* 33.8* 34.1*  MCV 94.2 95.1 96.0 95.7 96.8 97.7  PLT 170 155 149* 149* 146* 157   Cardiac Enzymes: Recent Labs  Lab 01/31/18 1610 02/01/18 0019 02/01/18 0613  TROPONINI 0.03* 0.07* 0.06*   BNP: Invalid input(s): POCBNP CBG: Recent Labs  Lab 02/04/18 0739 02/04/18 1143 02/04/18 1636 02/04/18 2200 02/05/18 0740  GLUCAP 132* 159* 325* 278* 138*   D-Dimer No results for input(s): DDIMER in the last 72 hours. Hgb  A1c No results for input(s): HGBA1C in the last 72 hours. Lipid Profile No results for input(s): CHOL, HDL, LDLCALC, TRIG, CHOLHDL, LDLDIRECT in the last 72 hours. Thyroid function studies No results for input(s): TSH, T4TOTAL, T3FREE, THYROIDAB in the last 72 hours.  Invalid input(s): FREET3 Anemia work up No results for input(s): VITAMINB12, FOLATE, FERRITIN, TIBC, IRON, RETICCTPCT in the last 72 hours. Urinalysis    Component Value Date/Time   COLORURINE YELLOW 01/31/2018 1349   APPEARANCEUR CLEAR 01/31/2018 1349   APPEARANCEUR Cloudy (A) 12/17/2013 0906   LABSPEC 1.012 01/31/2018 1349   PHURINE 6.0 01/31/2018 1349   GLUCOSEU NEGATIVE 01/31/2018 1349   HGBUR MODERATE (A) 01/31/2018 1349   BILIRUBINUR NEGATIVE 01/31/2018 1349   BILIRUBINUR Negative 12/17/2013 0906   KETONESUR NEGATIVE 01/31/2018 1349   PROTEINUR 30 (A) 01/31/2018 1349   NITRITE NEGATIVE 01/31/2018 1349   LEUKOCYTESUR NEGATIVE 01/31/2018 1349   LEUKOCYTESUR Negative 12/17/2013 0906   Sepsis Labs Invalid input(s): PROCALCITONIN,  WBC,  LACTICIDVEN Microbiology Recent Results (from the past 240 hour(s))  Blood Culture (routine x 2)     Status: None   Collection Time: 01/31/18  1:49 PM  Result Value Ref Range  Status   Specimen Description BLOOD SPECIMEN SOURCE NOT MARKED ON REQUISITION  Final   Special Requests   Final    BOTTLES DRAWN AEROBIC AND ANAEROBIC Blood Culture results may not be optimal due to an excessive volume of blood received in culture bottles Performed at [facility], [place] [address], [place], [place] [address]    Culture   Final    NO GROWTH 5 DAYS Performed at [facility], [place] [address], [place], [place] [address]    Report Status 02/05/2018 FINAL  Final  Blood Culture (routine x 2)     Status: None   Collection Time: 01/31/18  1:59 PM  Result Value Ref Range Status   Specimen Description   Final    BLOOD LEFT HAND Performed at [facility] [address] [place], [place] [address]    Special Requests   Final    BOTTLES DRAWN AEROBIC AND ANAEROBIC Blood Culture adequate volume Performed at [facility] [address], [place], [place] [address]    Culture   Final    NO GROWTH 5 DAYS Performed at [facility], [place] [address] [place],  [address]    Report Status 02/05/2018 FINAL  Final  Respiratory Panel by PCR     Status: Abnormal   Collection Time: 01/31/18  1:59 PM  Result Value Ref Range Status   Adenovirus NOT DETECTED NOT DETECTED Final   Coronavirus 229E NOT DETECTED NOT DETECTED Final   Coronavirus HKU1 NOT DETECTED NOT DETECTED Final   Coronavirus NL63 NOT DETECTED NOT DETECTED Final   Coronavirus OC43 NOT DETECTED NOT DETECTED Final   Metapneumovirus NOT DETECTED NOT DETECTED Final   Rhinovirus / Enterovirus DETECTED (A) NOT DETECTED Final   Influenza A NOT DETECTED NOT DETECTED Final   Influenza B NOT DETECTED NOT DETECTED Final   Parainfluenza Virus 1 NOT DETECTED NOT DETECTED Final   Parainfluenza Virus 2 NOT DETECTED NOT DETECTED Final   Parainfluenza Virus 3 NOT DETECTED NOT DETECTED Final   Parainfluenza Virus 4 NOT DETECTED NOT DETECTED Final   Respiratory  Syncytial Virus NOT DETECTED NOT DETECTED Final   Bordetella pertussis NOT DETECTED NOT DETECTED Final   Chlamydophila pneumoniae NOT DETECTED NOT DETECTED Final   Mycoplasma pneumoniae  NOT DETECTED NOT DETECTED Final    Comment: Performed at [facility], [place] [address] [place], [place] [address]  MRSA PCR Screening     Status: None   Collection Time: 01/31/18  7:22 PM  Result Value Ref Range Status   MRSA by PCR NEGATIVE NEGATIVE Final    Comment:        The GeneXpert MRSA Assay (FDA approved for NASAL specimens only), is one component of a comprehensive MRSA colonization surveillance program. It is not intended to diagnose MRSA infection nor to guide or monitor treatment for MRSA infections. Performed at [facility], [place] [address], [place], [place] [address]   Culture, Urine     Status: Abnormal   Collection Time: 02/02/18  5:57 AM  Result Value Ref Range Status   Specimen Description   Final    URINE, CLEAN CATCH Performed at [facility], [place] [address] [place], [place] [address]    Special Requests   Final    NONE Performed at [facility], [place] [address], [place], [place] [address]    Culture (A)  Final    <10,000 COLONIES/mL INSIGNIFICANT GROWTH Performed at [facility] [address], [place], [place] [address]    Report Status 02/03/2018 FINAL  Final     Time coordinating discharge: 35 minutes.,   SIGNED:   Elmarie Shiley, MD  [facility] 02/05/2018, 11:54 AM Pager   If 7PM-7AM, please contact night-coverage [email or web address] Password [email or web address]

## 2018-02-05 NOTE — Care Management Note (Signed)
Case Management Note  Patient Details  Name: BREELYN ICARD MRN: [id number] Date of Birth: Mar 13, 1932  Subjective/Objective: d/c home today. [facility] has laready delivered home 02,& neb machine-aware of [facility] services. No further CM needs.                   Action/Plan:dc home w/HHC/dme   Expected Discharge Date:  02/05/18               Expected Discharge Plan:  [place]  In-House Referral:     Discharge planning Services  CM Consult  Post Acute Care Choice:    Choice offered to:  Patient  DME Arranged:  Nebulizer machine, Oxygen DME Agency:  [place] Arranged:  RN, PT, Nurse's Aide [place] Agency:  [place]  Status of Service:  Completed, signed off  If discussed at [place] of Stay Meetings, dates discussed:    Additional Comments:  Dessa Phi, RN 02/05/2018, 12:02 PM

## 2018-02-06 ENCOUNTER — Telehealth: Payer: Self-pay

## 2018-02-06 NOTE — Telephone Encounter (Signed)
Transition Care Management Follow-up Telephone Call  Date of discharge and from where: [facility] on 02/05/2018  How have you been since you were released from the hospital? Breathing much better. O2 93-96%. On oxygen on and off, she is not sure how many L  Any questions or concerns? No   Items Reviewed:  Did the pt receive and understand the discharge instructions provided? Yes   Medications obtained and verified? Yes   Any new allergies since your discharge? No   Dietary orders reviewed? Yes  Do you have support at home? Yes , son lives with her. Granddaughter comes over to help  Other (ie: DME, Home Health, etc) Oxygen PRN  Functional Questionnaire: (I = Independent and D = Dependent) ADL's: I  Bathing/Dressing- I   Meal Prep- I  Eating- I  Maintaining continence- I  Transferring/Ambulation- I w/ assist if needed  Managing Meds- I w/ assist   Follow up appointments reviewed:    PCP Hospital f/u appt confirmed? Yes  Scheduled to see Sherrie Mustache, NP on 02/13/2018 @ 10:15am.  [facility] f/u appt confirmed? No  Informed patient that cardiologist and pulmonologist appts need to be made  Are transportation arrangements needed? No  If their condition worsens, is the pt aware to call  their PCP or go to the ED? Yes  Was the patient provided with contact information for the PCP's office or ED? Yes  Was the pt encouraged to call back with questions or concerns? Yes

## 2018-02-07 DIAGNOSIS — Z7901 Long term (current) use of anticoagulants: Secondary | ICD-10-CM | POA: Diagnosis not present

## 2018-02-07 DIAGNOSIS — E1142 Type 2 diabetes mellitus with diabetic polyneuropathy: Secondary | ICD-10-CM | POA: Diagnosis not present

## 2018-02-07 DIAGNOSIS — E1122 Type 2 diabetes mellitus with diabetic chronic kidney disease: Secondary | ICD-10-CM | POA: Diagnosis not present

## 2018-02-07 DIAGNOSIS — F329 Major depressive disorder, single episode, unspecified: Secondary | ICD-10-CM | POA: Diagnosis not present

## 2018-02-07 DIAGNOSIS — J9601 Acute respiratory failure with hypoxia: Secondary | ICD-10-CM | POA: Diagnosis not present

## 2018-02-07 DIAGNOSIS — J209 Acute bronchitis, unspecified: Secondary | ICD-10-CM | POA: Diagnosis not present

## 2018-02-07 DIAGNOSIS — Z7984 Long term (current) use of oral hypoglycemic drugs: Secondary | ICD-10-CM | POA: Diagnosis not present

## 2018-02-07 DIAGNOSIS — I129 Hypertensive chronic kidney disease with stage 1 through stage 4 chronic kidney disease, or unspecified chronic kidney disease: Secondary | ICD-10-CM | POA: Diagnosis not present

## 2018-02-07 DIAGNOSIS — N183 Chronic kidney disease, stage 3 (moderate): Secondary | ICD-10-CM | POA: Diagnosis not present

## 2018-02-07 DIAGNOSIS — F419 Anxiety disorder, unspecified: Secondary | ICD-10-CM | POA: Diagnosis not present

## 2018-02-07 DIAGNOSIS — Z8673 Personal history of transient ischemic attack (TIA), and cerebral infarction without residual deficits: Secondary | ICD-10-CM | POA: Diagnosis not present

## 2018-02-07 DIAGNOSIS — K589 Irritable bowel syndrome without diarrhea: Secondary | ICD-10-CM | POA: Diagnosis not present

## 2018-02-07 DIAGNOSIS — I480 Paroxysmal atrial fibrillation: Secondary | ICD-10-CM | POA: Diagnosis not present

## 2018-02-10 ENCOUNTER — Telehealth: Payer: Self-pay | Admitting: *Deleted

## 2018-02-10 ENCOUNTER — Telehealth: Payer: Self-pay | Admitting: Cardiology

## 2018-02-10 DIAGNOSIS — E1142 Type 2 diabetes mellitus with diabetic polyneuropathy: Secondary | ICD-10-CM | POA: Diagnosis not present

## 2018-02-10 DIAGNOSIS — N183 Chronic kidney disease, stage 3 (moderate): Secondary | ICD-10-CM | POA: Diagnosis not present

## 2018-02-10 DIAGNOSIS — I129 Hypertensive chronic kidney disease with stage 1 through stage 4 chronic kidney disease, or unspecified chronic kidney disease: Secondary | ICD-10-CM | POA: Diagnosis not present

## 2018-02-10 DIAGNOSIS — J209 Acute bronchitis, unspecified: Secondary | ICD-10-CM | POA: Diagnosis not present

## 2018-02-10 DIAGNOSIS — E1122 Type 2 diabetes mellitus with diabetic chronic kidney disease: Secondary | ICD-10-CM | POA: Diagnosis not present

## 2018-02-10 DIAGNOSIS — J9601 Acute respiratory failure with hypoxia: Secondary | ICD-10-CM | POA: Diagnosis not present

## 2018-02-10 NOTE — Telephone Encounter (Signed)
Spoke with patient regarding 2 pause episodes, longest 3 seconds, November 8th and 9th. Patient reports asymptomatic with episodes. No syncope or presyncope. Patient inquired about Hospital follow-up. Advised patient I would send a message to the scheduler, Lorenda Hatchet to schedule appointment.

## 2018-02-10 NOTE — Telephone Encounter (Signed)
LMOVM requesting that pt send manual transmission b/c home monitor has not updated in at least 14 days.    

## 2018-02-11 ENCOUNTER — Other Ambulatory Visit: Payer: Self-pay | Admitting: Internal Medicine

## 2018-02-11 DIAGNOSIS — J9601 Acute respiratory failure with hypoxia: Secondary | ICD-10-CM | POA: Diagnosis not present

## 2018-02-11 DIAGNOSIS — E1142 Type 2 diabetes mellitus with diabetic polyneuropathy: Secondary | ICD-10-CM | POA: Diagnosis not present

## 2018-02-11 DIAGNOSIS — J209 Acute bronchitis, unspecified: Secondary | ICD-10-CM | POA: Diagnosis not present

## 2018-02-11 DIAGNOSIS — N183 Chronic kidney disease, stage 3 (moderate): Secondary | ICD-10-CM | POA: Diagnosis not present

## 2018-02-11 DIAGNOSIS — E1122 Type 2 diabetes mellitus with diabetic chronic kidney disease: Secondary | ICD-10-CM | POA: Diagnosis not present

## 2018-02-11 DIAGNOSIS — I129 Hypertensive chronic kidney disease with stage 1 through stage 4 chronic kidney disease, or unspecified chronic kidney disease: Secondary | ICD-10-CM | POA: Diagnosis not present

## 2018-02-12 LAB — CUP PACEART REMOTE DEVICE CHECK
Implantable Pulse Generator Implant Date: 20180502
MDC IDC SESS DTM: 20191019103609

## 2018-02-13 ENCOUNTER — Encounter: Payer: Self-pay | Admitting: Nurse Practitioner

## 2018-02-13 ENCOUNTER — Ambulatory Visit (INDEPENDENT_AMBULATORY_CARE_PROVIDER_SITE_OTHER): Payer: Medicare Other | Admitting: Nurse Practitioner

## 2018-02-13 VITALS — BP 136/84 | HR 80 | Temp 98.0°F | Ht 66.0 in | Wt 176.0 lb

## 2018-02-13 DIAGNOSIS — I480 Paroxysmal atrial fibrillation: Secondary | ICD-10-CM | POA: Diagnosis not present

## 2018-02-13 DIAGNOSIS — J440 Chronic obstructive pulmonary disease with acute lower respiratory infection: Secondary | ICD-10-CM

## 2018-02-13 DIAGNOSIS — J209 Acute bronchitis, unspecified: Secondary | ICD-10-CM | POA: Diagnosis not present

## 2018-02-13 DIAGNOSIS — B370 Candidal stomatitis: Secondary | ICD-10-CM | POA: Diagnosis not present

## 2018-02-13 DIAGNOSIS — I272 Pulmonary hypertension, unspecified: Secondary | ICD-10-CM | POA: Diagnosis not present

## 2018-02-13 LAB — CBC WITH DIFFERENTIAL/PLATELET
BASOS ABS: 36 {cells}/uL (ref 0–200)
Basophils Relative: 0.3 %
EOS PCT: 1 %
Eosinophils Absolute: 119 cells/uL (ref 15–500)
HEMATOCRIT: 38.4 % (ref 35.0–45.0)
Hemoglobin: 12.5 g/dL (ref 11.7–15.5)
LYMPHS ABS: 1464 {cells}/uL (ref 850–3900)
MCH: 30.2 pg (ref 27.0–33.0)
MCHC: 32.6 g/dL (ref 32.0–36.0)
MCV: 92.8 fL (ref 80.0–100.0)
MONOS PCT: 7.8 %
MPV: 13.4 fL — ABNORMAL HIGH (ref 7.5–12.5)
NEUTROS PCT: 78.6 %
Neutro Abs: 9353 cells/uL — ABNORMAL HIGH (ref 1500–7800)
PLATELETS: 153 10*3/uL (ref 140–400)
RBC: 4.14 10*6/uL (ref 3.80–5.10)
RDW: 15.3 % — ABNORMAL HIGH (ref 11.0–15.0)
TOTAL LYMPHOCYTE: 12.3 %
WBC mixed population: 928 cells/uL (ref 200–950)
WBC: 11.9 10*3/uL — AB (ref 3.8–10.8)

## 2018-02-13 LAB — COMPLETE METABOLIC PANEL WITH GFR
AG Ratio: 1.5 (calc) (ref 1.0–2.5)
ALKALINE PHOSPHATASE (APISO): 75 U/L (ref 33–130)
ALT: 24 U/L (ref 6–29)
AST: 19 U/L (ref 10–35)
Albumin: 3.5 g/dL — ABNORMAL LOW (ref 3.6–5.1)
BUN/Creatinine Ratio: 18 (calc) (ref 6–22)
BUN: 27 mg/dL — AB (ref 7–25)
CALCIUM: 9 mg/dL (ref 8.6–10.4)
CO2: 33 mmol/L — AB (ref 20–32)
CREATININE: 1.51 mg/dL — AB (ref 0.60–0.88)
Chloride: 98 mmol/L (ref 98–110)
GFR, EST NON AFRICAN AMERICAN: 31 mL/min/{1.73_m2} — AB (ref >=60)
GFR, Est African American: 36 mL/min/{1.73_m2} — ABNORMAL LOW (ref >=60)
GLUCOSE: 91 mg/dL (ref 65–139)
Globulin: 2.4 g/dL (calc) (ref 1.9–3.7)
Potassium: 3.8 mmol/L (ref 3.5–5.3)
SODIUM: 138 mmol/L (ref 135–146)
Total Bilirubin: 0.7 mg/dL (ref 0.2–1.2)
Total Protein: 5.9 g/dL — ABNORMAL LOW (ref 6.1–8.1)

## 2018-02-13 MED ORDER — NYSTATIN 100000 UNIT/ML MT SUSP: 5.0000 mL | Freq: Four times a day (QID) | OROMUCOSAL | 0 refills | Status: DC

## 2018-02-13 NOTE — Progress Notes (Signed)
Careteam: Patient Care Team: [name], DO as PCP - General (Geriatric Medicine) Ulice Bold, MD as Referring Physician (Dermatology) Love, Alyson Locket, MD as Consulting Physician (Neurology) Sharyne Peach, MD as Consulting Physician (Ophthalmology) Gean Birchwood, DPM as Consulting Physician (Podiatry) Ronald Lobo, MD as Consulting Physician (Gastroenterology) Lomax, Marny Lowenstein, MD (Inactive) as Consulting Physician (Gynecology) Druscilla Brownie, MD as Consulting Physician (Dermatology) Clent Jacks, MD as Consulting Physician (Ophthalmology) Myrlene Broker, MD as Attending Physician (Urology) Minus Breeding, MD as Consulting Physician (Cardiology)  Advanced Directive information Does Patient Have a Medical Advance Directive?: No  Allergies  Allergen Reactions  . Jardiance [Empagliflozin] Diarrhea  . Metformin And Related Diarrhea  . Ceclor [Cefaclor] Other (See Comments)    Unknown reaction; not recalled  . Codeine Nausea And Vomiting  . Lopid [Gemfibrozil] Other (See Comments)    Unknown reaction; not recalled  . Metoprolol Nausea And Vomiting  . Morphine And Related Other (See Comments)    Passed out  . Niacin And Related Other (See Comments)    Unknown reaction; not recalled  . Relafen [Nabumetone] Nausea And Vomiting  . Septra [Sulfamethoxazole-Trimethoprim] Nausea And Vomiting  . Tetracycline Nausea And Vomiting  . Toprol Xl [Metoprolol Tartrate] Nausea And Vomiting  . Adhesive [Tape] Rash and Other (See Comments)    Tears the skin also    Chief Complaint  Patient presents with  . Transitions Of Care    Pt was recently admitted to [facility] 11/2 to 11/7. Pt has felt uneasy since having multiple breathing treatments on same day of admission. treatments cause spike in blood pressure     HPI: Patient is a 84 y.o. female seen in the office today for hospital follow up. ptwith medical history significant ofAtrial fibrillation, diabetes mellitus,  type 2, embolic stroke, hypertension, depression.Patient presents secondary to worsening shortness of breath. Pt was treated for acute hypoxic respiratory failure due to acute bronchitis. She was treated inpatient requiring 4L O2 which was titrated to 2L, doxycycline, IV solumedrol changed to PO prednisone and neb treatments. Completed doxycycline and prednisone since she has been home.   Reports during hospitalization she had 4 breathing treatments in 1 day and 1 lasted for an hour and reports she has not felt right since then. States she feels worse since when she went to the hospital. Very weak, no appetite. Feeling like she is hungry but does not want to eat.  Not nauseous.   Had chest pain during treatment but none since.  Echo was obtained -- increased pulmonary HTN on echo and will need outpatient follow up on this.   Weight was up during hospitalization due to fluid.   She had oral thrush and was prescribed nystatin completed this but still having discoloration to her tongue and altered taste  Pt with a fib and noted to have RVR on admission but became bradycardic during hospitalization. She was changed to atenolol once daily and continued on eliquis.   Has loop recorder- been getting phone calls that her heart rate was very low- son reports in the hospital it was down to 40.  Blood pressure has been elevated at home.   Review of Systems:  Review of Systems  Constitutional: Positive for malaise/fatigue. Negative for chills, fever and weight loss.  HENT: Negative for tinnitus.   Respiratory: Negative for cough, sputum production and shortness of breath.   Cardiovascular: Negative for chest pain, palpitations and leg swelling.  Gastrointestinal: Negative for abdominal pain, constipation, diarrhea  and heartburn.  Genitourinary: Negative for dysuria, frequency and urgency.  Musculoskeletal: Negative for back pain, falls, joint pain and myalgias.  Skin: Negative.   Neurological:  Negative for dizziness and headaches.  Psychiatric/Behavioral: Positive for memory loss. Negative for depression. The patient does not have insomnia.     Past Medical History:  Diagnosis Date  . Allergic rhinitis, cause unspecified   . Anxiety state, unspecified   . Diaphragmatic hernia without mention of obstruction or gangrene   . Diverticulosis of colon (without mention of hemorrhage)   . Female stress incontinence   . Insomnia, unspecified   . Interstitial cystitis    surgery 02/01/2013  . Irritable bowel syndrome   . Memory loss   . Other and unspecified hyperlipidemia   . Other premature beats   . Polyneuropathy in diabetes(357.2)   . Postmenopausal bleeding   . Proteinuria   . Stroke ([place])   . 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 Unspecified essential hypertension   . Viral warts, unspecified    Past Surgical History:  Procedure Laterality Date  . ABDOMINAL HYSTERECTOMY  1977   Dr.Hambright  . APPENDECTOMY  1965  . CATARACT EXTRACTION, BILATERAL  02/10/2006   Dr.Groat   . COLONOSCOPY  07/17/2004   polypectomy  . [place]  . LOOP RECORDER INSERTION N/A 07/31/2016   Procedure: Loop Recorder Insertion;  Surgeon: [name], MD;  Location: [facility];  Service: Cardiovascular;  Laterality: N/A;  . TEE WITHOUT CARDIOVERSION N/A 07/30/2016   Procedure: TRANSESOPHAGEAL ECHOCARDIOGRAM (TEE);  Surgeon: [name], MD;  Location: [facility];  Service: Cardiovascular;  Laterality: N/A;  . [place]  . TRIGGER FINGER RELEASE  05/13/16  . urinary bladder      surgery 02/01/2013 to stretch bladder stem   Social History:   reports that she quit smoking about 6 years ago. She has a 15.50 pack-year smoking history. She has never used smokeless tobacco. She reports that she does not drink alcohol or use drugs.  Family History    Problem Relation Age of Onset  . Cancer Mother        pancreatic  . Cancer Father        bladder  . Heart attack Father   . Hypertension Sister   . Diabetes Sister   . Diabetes Brother   . Kidney cancer Brother   . Hypertension Brother   . Diabetes Brother   . Heart disease Brother 36       Myocardial Infarction     Medications: Patient's Medications  New Prescriptions   No medications on file  Previous Medications   ALBUTEROL (PROVENTIL HFA;VENTOLIN HFA) 108 (90 BASE) MCG/ACT INHALER    Inhale 2 puffs into the lungs every 6 (six) hours as needed for wheezing or shortness of breath.   AMLODIPINE (NORVASC) 2.5 MG TABLET    Take one tablet twice daily for blood pressure   APIXABAN (ELIQUIS) 5 MG TABS TABLET    Take 1 tablet (5 mg total) by mouth 2 (two) times daily.   ATENOLOL (TENORMIN) 25 MG TABLET    Take 0.5 tablets (12.5 mg total) by mouth daily.   ATORVASTATIN (LIPITOR) 40 MG TABLET    Take 1 tablet (40 mg total) by mouth daily at 6 PM.   CITALOPRAM (CELEXA) 10 MG TABLET    Take 1 tablet (10 mg  total) by mouth daily.   GABAPENTIN (NEURONTIN) 300 MG CAPSULE    Take 1 capsule (300 mg total) by mouth 2 (two) times daily.   GLUCOSE BLOOD (ACCU-CHEK AVIVA PLUS) TEST STRIP    Use to test blood sugar twice daily. Dx: E11.29   GUAIFENESIN (MUCINEX) 600 MG 12 HR TABLET    Take 1 tablet (600 mg total) by mouth 2 (two) times daily.   IPRATROPIUM-ALBUTEROL (DUONEB) 0.5-2.5 (3) MG/3ML SOLN    Take 3 mLs by nebulization 3 (three) times daily.   LANCETS (ACCU-CHEK MULTICLIX) LANCETS    Use to test blood sugar twice daily. E11.29   PIOGLITAZONE (ACTOS) 15 MG TABLET    Take 1 tablet (15 mg total) by mouth daily.   VITAMIN D, ERGOCALCIFEROL, (DRISDOL) 50000 UNITS CAPS CAPSULE    TAKE 1 CAPSULE WEEKLY FOR  VITAMIN D  Modified Medications   No medications on file  Discontinued Medications   NYSTATIN (MYCOSTATIN) 100000 UNIT/ML SUSPENSION    Take 5 mLs (500,000 Units total) by mouth 4 (four)  times daily.     Physical Exam:  Vitals:   02/13/18 1010  BP: 136/84  Pulse: 80  Temp: 98 F (36.7 C)  TempSrc: Oral  SpO2: 95%  Weight: 176 lb (79.8 kg)  Height: 5\' 6"  (1.676 m)   Body mass index is 28.41 kg/m.  Physical Exam  Constitutional: She is oriented to person, place, and time. She appears well-developed and well-nourished. No distress.  HENT:  Right Ear: External ear normal.  Left Ear: External ear normal.  Nose: Nose normal.  Mouth/Throat: Oropharynx is clear and moist.  Tongue with thick yellow/white discoloration.   Neck: Neck supple.  Cardiovascular: Normal rate, regular rhythm, normal heart sounds and intact distal pulses.  Pulmonary/Chest: Effort normal and breath sounds normal. No respiratory distress.  Musculoskeletal: Normal range of motion.  Neurological: She is alert and oriented to person, place, and time.  Skin: Skin is warm and dry.  Psychiatric: She has a normal mood and affect.    Labs reviewed: Basic Metabolic Panel: Recent Labs    02/02/18 0534 02/03/18 0539 02/04/18 0553 02/05/18 0612  NA 137 138 138 138  K 3.9 3.8 4.1 3.8  CL 105 108 109 108  CO2 25 23 23 24   GLUCOSE 173* 162* 162* 161*  BUN 50* 50* 44* 38*  CREATININE 1.89* 1.50* 1.56* 1.39*  CALCIUM 8.7* 8.4* 8.2* 8.4*  MG 1.9  --  1.9  --    Liver Function Tests: Recent Labs    05/12/17 1435 01/31/18 1347 02/01/18 0613  AST 11 18 16   ALT 10 14 12   ALKPHOS  --  100 74  BILITOT 0.4 0.7 0.5  PROT 6.6 7.7 6.3*  ALBUMIN  --  4.1 3.2*   Recent Labs    05/12/17 1435  LIPASE 70*  AMYLASE 33   No results for input(s): AMMONIA in the last 8760 hours. CBC: Recent Labs    02/02/18 0534 02/03/18 0539 02/04/18 0553 02/05/18 0612  WBC 19.2* 13.8* 12.0* 11.8*  NEUTROABS 16.0* 11.0* 8.9*  --   HGB 10.2* 10.4* 10.4* 10.3*  HCT 33.4* 33.5* 33.8* 34.1*  MCV 96.0 95.7 96.8 97.7  PLT 149* 149* 146* 157   Lipid Panel: Recent Labs    03/17/17 0920  CHOL 135  HDL  40*  LDLCALC 69  TRIG 179*  CHOLHDL 3.4   TSH: No results for input(s): TSH in the last 8760 hours. A1C: Lab Results  Component  Value Date   HGBA1C 7.7 (H) 12/08/2017     Assessment/Plan 1. Thrush, oral Ongoing, will retreat with nystatin - nystatin (MYCOSTATIN) 100000 UNIT/ML suspension; Take 5 mLs (500,000 Units total) by mouth 4 (four) times daily.  Dispense: 60 mL; Refill: 0  2. Pulmonary hypertension ([place]) -noted on recent echo on hospitalization - Ambulatory referral to Pulmonology for further evaluation  3. Paroxysmal atrial fibrillation ([facility]) Rate controlled. During hospitalization when she was receiving multiple breathing treatments was having a hard time controlling. Getting calls that heart rate is low, encouraged to follow up with cardiologist at this time.   4. Acute bronchitis with COPD ([place]) Has improved. Currently has completed antibiotics and steroids. Overall feeling much better  - CBC with Differential/Platelets - COMPLETE METABOLIC PANEL WITH GFR  Next appt:  Edwin Cherian K. [place], [facility] [phone]

## 2018-02-17 ENCOUNTER — Ambulatory Visit: Payer: Medicare Other | Admitting: Podiatry

## 2018-02-17 ENCOUNTER — Telehealth: Payer: Self-pay

## 2018-02-17 DIAGNOSIS — J9601 Acute respiratory failure with hypoxia: Secondary | ICD-10-CM | POA: Diagnosis not present

## 2018-02-17 DIAGNOSIS — E1142 Type 2 diabetes mellitus with diabetic polyneuropathy: Secondary | ICD-10-CM | POA: Diagnosis not present

## 2018-02-17 DIAGNOSIS — I129 Hypertensive chronic kidney disease with stage 1 through stage 4 chronic kidney disease, or unspecified chronic kidney disease: Secondary | ICD-10-CM | POA: Diagnosis not present

## 2018-02-17 DIAGNOSIS — E1122 Type 2 diabetes mellitus with diabetic chronic kidney disease: Secondary | ICD-10-CM | POA: Diagnosis not present

## 2018-02-17 DIAGNOSIS — J209 Acute bronchitis, unspecified: Secondary | ICD-10-CM | POA: Diagnosis not present

## 2018-02-17 DIAGNOSIS — N183 Chronic kidney disease, stage 3 (moderate): Secondary | ICD-10-CM | POA: Diagnosis not present

## 2018-02-17 NOTE — Telephone Encounter (Signed)
Patient granddaughter Penny Miller called requesting to go over patient lab results. Call was placed to patient to go over labs on 02/16/2018 and patient verbalized understanding. However granddaughter stated patient was confused. I returned call to granddaughter to discuss no voicemail.

## 2018-02-19 ENCOUNTER — Ambulatory Visit (INDEPENDENT_AMBULATORY_CARE_PROVIDER_SITE_OTHER): Payer: Medicare Other

## 2018-02-19 DIAGNOSIS — I639 Cerebral infarction, unspecified: Secondary | ICD-10-CM | POA: Diagnosis not present

## 2018-02-19 NOTE — Progress Notes (Signed)
Carelink Summary Report / Loop Recorder 

## 2018-02-20 DIAGNOSIS — I129 Hypertensive chronic kidney disease with stage 1 through stage 4 chronic kidney disease, or unspecified chronic kidney disease: Secondary | ICD-10-CM | POA: Diagnosis not present

## 2018-02-20 DIAGNOSIS — J9601 Acute respiratory failure with hypoxia: Secondary | ICD-10-CM | POA: Diagnosis not present

## 2018-02-20 DIAGNOSIS — E1122 Type 2 diabetes mellitus with diabetic chronic kidney disease: Secondary | ICD-10-CM | POA: Diagnosis not present

## 2018-02-20 DIAGNOSIS — N183 Chronic kidney disease, stage 3 (moderate): Secondary | ICD-10-CM | POA: Diagnosis not present

## 2018-02-20 DIAGNOSIS — E1142 Type 2 diabetes mellitus with diabetic polyneuropathy: Secondary | ICD-10-CM | POA: Diagnosis not present

## 2018-02-20 DIAGNOSIS — J209 Acute bronchitis, unspecified: Secondary | ICD-10-CM | POA: Diagnosis not present

## 2018-02-23 ENCOUNTER — Encounter: Payer: Self-pay | Admitting: Internal Medicine

## 2018-02-24 ENCOUNTER — Other Ambulatory Visit: Payer: Self-pay | Admitting: *Deleted

## 2018-02-24 MED ORDER — VITAMIN D (ERGOCALCIFEROL) 1.25 MG (50000 UNIT) PO CAPS: ORAL_CAPSULE | ORAL | 0 refills | Status: DC

## 2018-02-24 NOTE — Telephone Encounter (Signed)
Patient Requested refill. Faxed

## 2018-02-25 DIAGNOSIS — C44722 Squamous cell carcinoma of skin of right lower limb, including hip: Secondary | ICD-10-CM | POA: Diagnosis not present

## 2018-02-25 DIAGNOSIS — D485 Neoplasm of uncertain behavior of skin: Secondary | ICD-10-CM | POA: Diagnosis not present

## 2018-03-02 ENCOUNTER — Encounter: Payer: Self-pay | Admitting: Internal Medicine

## 2018-03-03 ENCOUNTER — Ambulatory Visit (INDEPENDENT_AMBULATORY_CARE_PROVIDER_SITE_OTHER): Payer: Medicare Other | Admitting: Podiatry

## 2018-03-03 DIAGNOSIS — M79675 Pain in left toe(s): Secondary | ICD-10-CM

## 2018-03-03 DIAGNOSIS — B351 Tinea unguium: Secondary | ICD-10-CM | POA: Diagnosis not present

## 2018-03-03 DIAGNOSIS — M79674 Pain in right toe(s): Secondary | ICD-10-CM | POA: Diagnosis not present

## 2018-03-03 DIAGNOSIS — I129 Hypertensive chronic kidney disease with stage 1 through stage 4 chronic kidney disease, or unspecified chronic kidney disease: Secondary | ICD-10-CM | POA: Diagnosis not present

## 2018-03-03 DIAGNOSIS — N183 Chronic kidney disease, stage 3 (moderate): Secondary | ICD-10-CM | POA: Diagnosis not present

## 2018-03-03 DIAGNOSIS — E1122 Type 2 diabetes mellitus with diabetic chronic kidney disease: Secondary | ICD-10-CM | POA: Diagnosis not present

## 2018-03-03 DIAGNOSIS — J9601 Acute respiratory failure with hypoxia: Secondary | ICD-10-CM | POA: Diagnosis not present

## 2018-03-03 DIAGNOSIS — E1142 Type 2 diabetes mellitus with diabetic polyneuropathy: Secondary | ICD-10-CM | POA: Diagnosis not present

## 2018-03-03 DIAGNOSIS — J209 Acute bronchitis, unspecified: Secondary | ICD-10-CM | POA: Diagnosis not present

## 2018-03-03 NOTE — Patient Instructions (Signed)
Diabetes and Foot Care Diabetes may cause you to have problems because of poor blood supply (circulation) to your feet and legs. This may cause the skin on your feet to become thinner, break easier, and heal more slowly. Your skin may become dry, and the skin may peel and crack. You may also have nerve damage in your legs and feet causing decreased feeling in them. You may not notice minor injuries to your feet that could lead to infections or more serious problems. Taking care of your feet is one of the most important things you can do for yourself. Follow these instructions at home:  Wear shoes at all times, even in the house. Do not go barefoot. Bare feet are easily injured.  Check your feet daily for blisters, cuts, and redness. If you cannot see the bottom of your feet, use a mirror or ask someone for help.  Wash your feet with warm water (do not use hot water) and mild soap. Then pat your feet and the areas between your toes until they are completely dry. Do not soak your feet as this can dry your skin.  Apply a moisturizing lotion or petroleum jelly (that does not contain alcohol and is unscented) to the skin on your feet and to dry, brittle toenails. Do not apply lotion between your toes.  Trim your toenails straight across. Do not dig under them or around the cuticle. File the edges of your nails with an emery board or nail file.  Do not cut corns or calluses or try to remove them with medicine.  Wear clean socks or stockings every day. Make sure they are not too tight. Do not wear knee-high stockings since they may decrease blood flow to your legs.  Wear shoes that fit properly and have enough cushioning. To break in new shoes, wear them for just a few hours a day. This prevents you from injuring your feet. Always look in your shoes before you put them on to be sure there are no objects inside.  Do not cross your legs. This may decrease the blood flow to your feet.  If you find a  minor scrape, cut, or break in the skin on your feet, keep it and the skin around it clean and dry. These areas may be cleansed with mild soap and water. Do not cleanse the area with peroxide, alcohol, or iodine.  When you remove an adhesive bandage, be sure not to damage the skin around it.  If you have a wound, look at it several times a day to make sure it is healing.  Do not use heating pads or hot water bottles. They may burn your skin. If you have lost feeling in your feet or legs, you may not know it is happening until it is too late.  Make sure your health care provider performs a complete foot exam at least annually or more often if you have foot problems. Report any cuts, sores, or bruises to your health care provider immediately. Contact a health care provider if:  You have an injury that is not healing.  You have cuts or breaks in the skin.  You have an ingrown nail.  You notice redness on your legs or feet.  You feel burning or tingling in your legs or feet.  You have pain or cramps in your legs and feet.  Your legs or feet are numb.  Your feet always feel cold. Get help right away if:  There is increasing   redness, swelling, or pain in or around a wound.  There is a red line that goes up your leg.  Pus is coming from a wound.  You develop a fever or as directed by your health care provider.  You notice a bad smell coming from an ulcer or wound. This information is not intended to replace advice given to you by your health care provider. Make sure you discuss any questions you have with your health care provider. Document Released: 03/15/2000 Document Revised: 08/24/2015 Document Reviewed: 08/25/2012 Elsevier Interactive Patient Education  2017 [employer]  Diabetic Neuropathy Diabetic neuropathy is a nerve disease or nerve damage that is caused by diabetes mellitus. About half of all people with diabetes mellitus have some form of nerve damage. Nerve damage  is more common in those who have had diabetes mellitus for many years and who generally have not had good control of their blood sugar (glucose) level. Diabetic neuropathy is a common complication of diabetes mellitus. There are three common types of diabetic neuropathy and a fourth type that is less common and less understood:  Peripheral neuropathy-This is the most common type of diabetic neuropathy. It causes damage to the nerves of the feet and legs first and then eventually the hands and arms. The damage affects the ability to sense touch.  Autonomic neuropathy-This type causes damage to the autonomic nervous system, which controls the following functions: ? Heartbeat. ? Body temperature. ? Blood pressure. ? Urination. ? Digestion. ? Sweating. ? Sexual function.  Focal neuropathy-Focal neuropathy can be painful and unpredictable and occurs most often in older adults with diabetes mellitus. It involves a specific nerve or one area and often comes on suddenly. It usually does not cause long-term problems.  Radiculoplexus neuropathy- Sometimes called lumbosacral radiculoplexus neuropathy, radiculoplexus neuropathy affects the nerves of the thighs, hips, buttocks, or legs. It is more common in people with type 2 diabetes mellitus and in older men. It is characterized by debilitating pain, weakness, and atrophy, usually in the thigh muscles.  What are the causes? The cause of peripheral, autonomic, and focal neuropathies is diabetes mellitus that is uncontrolled and high glucose levels. The cause of radiculoplexus neuropathy is unknown. However, it is thought to be caused by inflammation related to uncontrolled glucose levels. What are the signs or symptoms? Peripheral Neuropathy Peripheral neuropathy develops slowly over time. When the nerves of the feet and legs no longer work there may be:  Burning, stabbing, or aching pain in the legs or feet.  Inability to feel pressure or pain in your  feet. This can lead to: ? Thick calluses over pressure areas. ? Pressure sores. ? Ulcers.  Foot deformities.  Reduced ability to feel temperature changes.  Muscle weakness.  Autonomic Neuropathy The symptoms of autonomic neuropathy vary depending on which nerves are affected. Symptoms may include:  Problems with digestion, such as: ? Feeling sick to your stomach (nausea). ? Vomiting. ? Bloating. ? Constipation. ? Diarrhea. ? Abdominal pain.  Difficulty with urination. This occurs if you lose your ability to sense when your bladder is full. Problems include: ? Urine leakage (incontinence). ? Inability to empty your bladder completely (retention).  Rapid or irregular heartbeat (palpitations).  Blood pressure drops when you stand up (orthostatic hypotension). When you stand up you may feel: ? Dizzy. ? Weak. ? Faint.  In men, inability to attain and maintain an erection.  In women, vaginal dryness and problems with decreased sexual desire and arousal.  Problems with body temperature  regulation.  Increased or decreased sweating.  Focal Neuropathy  Abnormal eye movements or abnormal alignment of both eyes.  Weakness in the wrist.  Foot drop. This results in an inability to lift the foot properly and abnormal walking or foot movement.  Paralysis on one side of your face (Bell palsy).  Chest or abdominal pain. Radiculoplexus Neuropathy  Sudden, severe pain in your hip, thigh, or buttocks.  Weakness and wasting of thigh muscles.  Difficulty rising from a seated position.  Abdominal swelling.  Unexplained weight loss (usually more than 10 lb [4.5 kg]). How is this diagnosed? Peripheral Neuropathy Your senses may be tested. Sensory function testing can be done with:  A light touch using a monofilament.  A vibration with tuning fork.  A sharp sensation with a pin prick.  Other tests that can help diagnose neuropathy are:  Nerve conduction velocity. This  test checks the transmission of an electrical current through a nerve.  Electromyography. This shows how muscles respond to electrical signals transmitted by nearby nerves.  Quantitative sensory testing. This is used to assess how your nerves respond to vibrations and changes in temperature.  Autonomic Neuropathy Diagnosis is often based on reported symptoms. Tell your health care provider if you experience:  Dizziness.  Constipation.  Diarrhea.  Inappropriate urination or inability to urinate.  Inability to get or maintain an erection.  Tests that may be done include:  Electrocardiography or Holter monitor. These are tests that can help show problems with the heart rate or heart rhythm.  An X-ray exam may be done.  Focal Neuropathy Diagnosis is made based on your symptoms and what your health care provider finds during your exam. Other tests may be done. They may include:  Nerve conduction velocities. This checks the transmission of electrical current through a nerve.  Electromyography. This shows how muscles respond to electrical signals transmitted by nearby nerves.  Quantitative sensory testing. This test is used to assess how your nerves respond to vibration and changes in temperature.  Radiculoplexus Neuropathy  Often the first thing is to eliminate any other issue or problems that might be the cause, as there is no standard test for diagnosis.  X-ray exam of your spine and lumbar region.  Spinal tap to rule out cancer.  MRI to rule out other lesions. How is this treated? Once nerve damage occurs, it cannot be reversed. The goal of treatment is to keep the disease or nerve damage from getting worse and affecting more nerve fibers. Controlling your blood glucose level is the key. Most people with radiculoplexus neuropathy see at least a partial improvement over time. You will need to keep your blood glucose and HbA1c levels in the target range determined by your  health care provider. Things that help control blood glucose levels include:  Blood glucose monitoring.  Meal planning.  Physical activity.  Diabetes medicine.  Over time, maintaining lower blood glucose levels helps lessen symptoms. Sometimes, prescription pain medicine is needed. Follow these instructions at home:  Do not smoke.  Keep your blood glucose level in the range that you and your health care provider have determined acceptable for you.  Keep your blood pressure level in the range that you and your health care provider have determined acceptable for you.  Eat a well-balanced diet.  Be physically active every day. Include strength training and balance exercises.  Protect your feet. ? Check your feet every day for sores, cuts, blisters, or signs of infection. ? Wear padded socks  and supportive shoes. Use orthotic inserts, if necessary. ? Regularly check the insides of your shoes for worn spots. Make sure there are no rocks or other items inside your shoes before you put them on. Contact a health care provider if:  You have burning, stabbing, or aching pain in the legs or feet.  You are unable to feel pressure or pain in your feet.  You develop problems with digestion such as: ? Nausea. ? Vomiting. ? Bloating. ? Constipation. ? Diarrhea. ? Abdominal pain.  You have difficulty with urination, such as: ? Incontinence. ? Retention.  You have palpitations.  You develop orthostatic hypotension. When you stand up you may feel: ? Dizzy. ? Weak. ? Faint.  You cannot attain and maintain an erection (in men).  You have vaginal dryness and problems with decreased sexual desire and arousal (in women).  You have severe pain in your thighs, legs, or buttocks.  You have unexplained weight loss. This information is not intended to replace advice given to you by your health care provider. Make sure you discuss any questions you have with your health care  provider. Document Released: 05/27/2001 Document Revised: 08/24/2015 Document Reviewed: 08/27/2012 Elsevier Interactive Patient Education  2017 [employer].

## 2018-03-05 ENCOUNTER — Ambulatory Visit (INDEPENDENT_AMBULATORY_CARE_PROVIDER_SITE_OTHER): Payer: Medicare Other | Admitting: Internal Medicine

## 2018-03-05 ENCOUNTER — Encounter: Payer: Self-pay | Admitting: Internal Medicine

## 2018-03-05 VITALS — BP 132/70 | HR 53 | Temp 98.1°F | Ht 66.0 in | Wt 178.0 lb

## 2018-03-05 DIAGNOSIS — I480 Paroxysmal atrial fibrillation: Secondary | ICD-10-CM | POA: Diagnosis not present

## 2018-03-05 DIAGNOSIS — J440 Chronic obstructive pulmonary disease with acute lower respiratory infection: Secondary | ICD-10-CM

## 2018-03-05 DIAGNOSIS — N183 Chronic kidney disease, stage 3 unspecified: Secondary | ICD-10-CM

## 2018-03-05 DIAGNOSIS — I272 Pulmonary hypertension, unspecified: Secondary | ICD-10-CM

## 2018-03-05 DIAGNOSIS — J209 Acute bronchitis, unspecified: Secondary | ICD-10-CM | POA: Diagnosis not present

## 2018-03-05 DIAGNOSIS — E1129 Type 2 diabetes mellitus with other diabetic kidney complication: Secondary | ICD-10-CM | POA: Diagnosis not present

## 2018-03-05 DIAGNOSIS — R001 Bradycardia, unspecified: Secondary | ICD-10-CM | POA: Diagnosis not present

## 2018-03-05 DIAGNOSIS — I639 Cerebral infarction, unspecified: Secondary | ICD-10-CM | POA: Diagnosis not present

## 2018-03-05 NOTE — Progress Notes (Signed)
Location:  [facility] clinic Provider:  Batsheva Stevick L. Mariea Clonts, D.O., C.M.D.  Goals of Care:  Advanced Directives 02/13/2018  Does Patient Have a Medical Advance Directive? No  Type of Advance Directive -  Does patient want to make changes to medical advance directive? -  Copy of [place] in Chart? -  Would patient like information on creating a medical advance directive? -   Chief Complaint  Patient presents with  . Hospitalization Follow-up    discharged 02/05/18    HPI: Patient is a 84 y.o. female seen today for medical management of chronic diseases.    She already got seen here for hospital f/u on 11/15.  She already had the f/u cbc and cmp with Janett Billow also.    She reports she had been ok until yesterday morning.  Had been having low pulse at times and then would come back up and could function a little more.    Woke up yesterday morning with a bad sore throat.  She's been gargling with salt water.  Now it's burning in her nose and it's going down into her chest.    She's been getting home [facility] with [facility].  Her oxygen has come up and she does not need it anymore.  They could not tell for sure why she has having respiratory failure at the hospital outside of her virus so she had to pay for her oxygen.  She is to get a pulmonary htn workup at pulmonology.  Sees Dr. Vaughan Browner 03/13/18.  She is no longer wheezing since her virus was treated in the hospital.  She only feels bad with drop in heart rate, not blood pressure.  Sees [name] 12/9 for cardiology f/u.  Had mild cardiomegaly on her chest xray in September.    Has CKD3.    WBC was still high two weeks ago when she had her labs.  H/h was better.  Weight is 178 lbs up from 176 lbs.  HR is dropping so low that she feels like she'll fall down. She's taking 1/4 of a 25mg  atenolol. HR readings were low 60s.    Saw Dr. Ledell Peoples office.  Had place on thigh that would not heal.  Second time it was checked,  it wound up being skin cancer--will be treated in the office there.  CBGs are staying under 200--most fastings are 135-145.  Has routine visit coming up also with fasting labs before.  Past Medical History:  Diagnosis Date  . Allergic rhinitis, cause unspecified   . Anxiety state, unspecified   . Diaphragmatic hernia without mention of obstruction or gangrene   . Diverticulosis of colon (without mention of hemorrhage)   . Female stress incontinence   . Insomnia, unspecified   . Interstitial cystitis    surgery 02/01/2013  . Irritable bowel syndrome   . Memory loss   . Other and unspecified hyperlipidemia   . Other premature beats   . Polyneuropathy in diabetes(357.2)   . Postmenopausal bleeding   . Proteinuria   . Stroke ([place])   . 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 Unspecified essential hypertension   . Viral warts, unspecified     Past Surgical History:  Procedure Laterality Date  . ABDOMINAL HYSTERECTOMY  1977   Dr.Hambright  . APPENDECTOMY  1965  . CATARACT EXTRACTION, BILATERAL  02/10/2006   Dr.Groat   . COLONOSCOPY  07/17/2004   polypectomy  .  [place]  . LOOP RECORDER INSERTION N/A 07/31/2016   Procedure: Loop Recorder Insertion;  Surgeon: [name], MD;  Location: [place] [facility];  Service: Cardiovascular;  Laterality: N/A;  . TEE WITHOUT CARDIOVERSION N/A 07/30/2016   Procedure: TRANSESOPHAGEAL ECHOCARDIOGRAM (TEE);  Surgeon: [name], MD;  Location: [place];  Service: Cardiovascular;  Laterality: N/A;  . [place]  . TRIGGER FINGER RELEASE  05/13/16  . urinary bladder      surgery 02/01/2013 to stretch bladder stem    Allergies  Allergen Reactions  . Jardiance [Empagliflozin] Diarrhea  . Metformin And Related Diarrhea  . Ceclor [Cefaclor] Other (See Comments)    Unknown reaction; not recalled  . Codeine  Nausea And Vomiting  . Lopid [Gemfibrozil] Other (See Comments)    Unknown reaction; not recalled  . Metoprolol Nausea And Vomiting  . Morphine And Related Other (See Comments)    Passed out  . Niacin And Related Other (See Comments)    Unknown reaction; not recalled  . Other   . Relafen [Nabumetone] Nausea And Vomiting  . Septra [Sulfamethoxazole-Trimethoprim] Nausea And Vomiting  . Tetracycline Nausea And Vomiting  . Toprol Xl [Metoprolol Tartrate] Nausea And Vomiting  . Adhesive [Tape] Rash and Other (See Comments)    Tears the skin also    Outpatient Encounter Medications as of 03/05/2018  Medication Sig  . albuterol (PROVENTIL HFA;VENTOLIN HFA) 108 (90 Base) MCG/ACT inhaler Inhale 2 puffs into the lungs every 6 (six) hours as needed for wheezing or shortness of breath.  Marland Kitchen amLODipine (NORVASC) 2.5 MG tablet Take one tablet twice daily for blood pressure  . apixaban (ELIQUIS) 5 MG TABS tablet Eliquis 5 mg tablet  . atenolol (TENORMIN) 25 MG tablet Take 0.5 tablets (12.5 mg total) by mouth daily.  Marland Kitchen atorvastatin (LIPITOR) 40 MG tablet Take 1 tablet (40 mg total) by mouth daily at 6 PM.  . citalopram (CELEXA) 10 MG tablet Take 1 tablet (10 mg total) by mouth daily.  . Dulaglutide (TRULICITY) 1.5 WU/9.8JX SOPN Trulicity 1.5 BJ/4.7 mL subcutaneous pen injector  . empagliflozin (JARDIANCE) 10 MG TABS tablet Jardiance 10 mg tablet  . gabapentin (NEURONTIN) 300 MG capsule Take 1 capsule (300 mg total) by mouth 2 (two) times daily.  Marland Kitchen glucose blood (ACCU-CHEK AVIVA PLUS) test strip Use to test blood sugar twice daily. Dx: E11.29  . Lancets (ACCU-CHEK MULTICLIX) lancets Use to test blood sugar twice daily. E11.29  . linagliptin (TRADJENTA) 5 MG TABS tablet Tradjenta 5 mg tablet  . metFORMIN (GLUCOPHAGE) 500 MG tablet metformin 500 mg tablet  . pioglitazone (ACTOS) 15 MG tablet Take 1 tablet (15 mg total) by mouth daily.  . Vitamin D, Ergocalciferol, (DRISDOL) 1.25 MG (50000 UT) CAPS capsule  Take one capsule by mouth once weekly for vitamin D  . [DISCONTINUED] apixaban (ELIQUIS) 5 MG TABS tablet Take 1 tablet (5 mg total) by mouth 2 (two) times daily.  . [DISCONTINUED] ergocalciferol (VITAMIN D2) 1.25 MG (50000 UT) capsule ergocalciferol (vitamin D2) 1,250 mcg (50,000 unit) capsule  . [DISCONTINUED] glucose blood test strip Accu-Chek Aviva Plus test strips  . [DISCONTINUED] guaiFENesin (MUCINEX) 600 MG 12 hr tablet Take 1 tablet (600 mg total) by mouth 2 (two) times daily.  . [DISCONTINUED] ipratropium-albuterol (DUONEB) 0.5-2.5 (3) MG/3ML SOLN Take 3 mLs by nebulization 3 (three) times daily.  . [DISCONTINUED] Lancets (ONETOUCH ULTRASOFT) lancets OneTouch UltraSoft Lancets  . [DISCONTINUED] nystatin (MYCOSTATIN) 100000  UNIT/ML suspension Take 5 mLs (500,000 Units total) by mouth 4 (four) times daily.   No facility-administered encounter medications on file as of 03/05/2018.     Review of Systems:  Review of Systems  Constitutional: Negative for chills, fever and malaise/fatigue.       Wt gain  HENT: Negative for congestion.   Eyes: Negative for blurred vision.  Respiratory: Negative for cough and shortness of breath.   Cardiovascular: Negative for chest pain, palpitations and leg swelling.       Bradycardia at times associated with dizziness  Gastrointestinal: Negative for abdominal pain, blood in stool, constipation, diarrhea and melena.  Genitourinary: Negative for dysuria.  Musculoskeletal: Negative for falls and joint pain.  Neurological: Positive for dizziness and speech change.  Psychiatric/Behavioral: Positive for memory loss. Negative for depression. The patient is not nervous/anxious and does not have insomnia.     Health Maintenance  Topic Date Due  . PNA vac Low Risk Adult (2 of 2 - PPSV23) 05/07/2017  . HEMOGLOBIN A1C  06/08/2018  . TETANUS/TDAP  09/15/2018  . OPHTHALMOLOGY EXAM  09/20/2018  . FOOT EXAM  12/16/2018  . URINE MICROALBUMIN  12/17/2018  .  INFLUENZA VACCINE  Completed  . DEXA SCAN  Completed    Physical Exam: Vitals:   03/05/18 1058  BP: 132/70  Pulse: (!) 53  Temp: 98.1 F (36.7 C)  TempSrc: Oral  SpO2: 97%  Weight: 178 lb (80.7 kg)  Height: 5\' 6"  (1.676 m)   Body mass index is 28.73 kg/m. Physical Exam  Constitutional: She is oriented to person, place, and time. She appears well-developed and well-nourished. No distress.  Cardiovascular: Intact distal pulses.  irreg irreg, brady  Pulmonary/Chest: Effort normal and breath sounds normal. No respiratory distress. She has no wheezes. She has no rales.  Abdominal: Bowel sounds are normal.  Musculoskeletal: Normal range of motion.  Neurological: She is alert and oriented to person, place, and time.  Skin: Skin is warm and dry. There is pallor.  Psychiatric: She has a normal mood and affect.    Labs reviewed: Basic Metabolic Panel: Recent Labs    02/02/18 0534  02/04/18 0553 02/05/18 0612 02/13/18 1122  NA 137   < > 138 138 138  K 3.9   < > 4.1 3.8 3.8  CL 105   < > 109 108 98  CO2 25   < > 23 24 33*  GLUCOSE 173*   < > 162* 161* 91  BUN 50*   < > 44* 38* 27*  CREATININE 1.89*   < > 1.56* 1.39* 1.51*  CALCIUM 8.7*   < > 8.2* 8.4* 9.0  MG 1.9  --  1.9  --   --    < > = values in this interval not displayed.   Liver Function Tests: Recent Labs    01/31/18 1347 02/01/18 0613 02/13/18 1122  AST 18 16 19   ALT 14 12 24   ALKPHOS 100 74  --   BILITOT 0.7 0.5 0.7  PROT 7.7 6.3* 5.9*  ALBUMIN 4.1 3.2*  --    Recent Labs    05/12/17 1435  LIPASE 70*  AMYLASE 33   No results for input(s): AMMONIA in the last 8760 hours. CBC: Recent Labs    02/03/18 0539 02/04/18 0553 02/05/18 0612 02/13/18 1122  WBC 13.8* 12.0* 11.8* 11.9*  NEUTROABS 11.0* 8.9*  --  9,353*  HGB 10.4* 10.4* 10.3* 12.5  HCT 33.5* 33.8* 34.1* 38.4  MCV 95.7 96.8  97.7 92.8  PLT 149* 146* 157 153   Lipid Panel: Recent Labs    03/17/17 0920  CHOL 135  HDL 40*  LDLCALC  69  TRIG 179*  CHOLHDL 3.4   Lab Results  Component Value Date   HGBA1C 7.7 (H) 12/08/2017    Assessment/Plan 1. Pulmonary hypertension ([place]) -for further workup with Dr. Vaughan Browner at [facility] out of her latest hospitalization--see NP note for synopsis  2. Paroxysmal atrial fibrillation (HCC) -having some difficulty with low rate -is taking 1/4 atenolol tablet rather than 1/2 as was discharged with (reports her granddaughter was told she's to take a 1/4 tablet, but this is not written anywhere) -gets dizzy when she's bradycardic -has f/u also with [name] coming up to review her monitor results  3. Acute bronchitis with COPD ([place]) -resolved, but now seems to be getting another URI--advised salt water gargles, mucinex and tessalon perles use--afebrile and not in her chest at this point--just started the last two days  4. 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 Chronic kidney disease (CKD), active medical management without dialysis, stage 3 (moderate) ([place]) -reviewed labs with her -Avoid nephrotoxic agents like nsaids, dose adjust renally excreted meds, hydrate.  6. Bradycardia -has low rate with her afib often which is symptomatic--may need pacemaker depending on monitor results  Labs/tests ordered:  Keep labs as scheduled Next appt:  04/13/2018   Kymberly Blomberg L. Jonhatan Hearty, D.O. [facility] [address], [place] [address] Cell Phone (Mon-Fri 8am-5pm):  [phone] On Call:  [phone] & follow prompts after 5pm & weekends Office Phone:  [phone] Office Fax:  [phone]

## 2018-03-09 ENCOUNTER — Ambulatory Visit (INDEPENDENT_AMBULATORY_CARE_PROVIDER_SITE_OTHER): Payer: Medicare Other | Admitting: Internal Medicine

## 2018-03-09 ENCOUNTER — Encounter: Payer: Self-pay | Admitting: Internal Medicine

## 2018-03-09 VITALS — BP 144/72 | HR 58 | Ht 66.0 in | Wt 176.0 lb

## 2018-03-09 DIAGNOSIS — I639 Cerebral infarction, unspecified: Secondary | ICD-10-CM | POA: Diagnosis not present

## 2018-03-09 DIAGNOSIS — I480 Paroxysmal atrial fibrillation: Secondary | ICD-10-CM | POA: Diagnosis not present

## 2018-03-09 NOTE — Patient Instructions (Signed)

## 2018-03-09 NOTE — Progress Notes (Signed)
HPI Penny Miller returns today for followup of cryptogenic stroke, s/p ILR. I have not seen her in over a year. In the interim, she has been found to have PAF as well as some mostly nocturnal pauses. Allergies  Allergen Reactions  . Jardiance [Empagliflozin] Diarrhea  . Metformin And Related Diarrhea  . Ceclor [Cefaclor] Other (See Comments)    Unknown reaction; not recalled  . Codeine Nausea And Vomiting  . Lopid [Gemfibrozil] Other (See Comments)    Unknown reaction; not recalled  . Metoprolol Nausea And Vomiting  . Morphine And Related Other (See Comments)    Passed out  . Niacin And Related Other (See Comments)    Unknown reaction; not recalled  . Other   . Relafen [Nabumetone] Nausea And Vomiting  . Septra [Sulfamethoxazole-Trimethoprim] Nausea And Vomiting  . Tetracycline Nausea And Vomiting  . Toprol Xl [Metoprolol Tartrate] Nausea And Vomiting  . Adhesive [Tape] Rash and Other (See Comments)    Tears the skin also     Current Outpatient Medications  Medication Sig Dispense Refill  . albuterol (PROVENTIL HFA;VENTOLIN HFA) 108 (90 Base) MCG/ACT inhaler Inhale 2 puffs into the lungs every 6 (six) hours as needed for wheezing or shortness of breath. 1 Inhaler 0  . amLODipine (NORVASC) 2.5 MG tablet Take one tablet twice daily for blood pressure 180 tablet 3  . apixaban (ELIQUIS) 5 MG TABS tablet Eliquis 5 mg tablet    . atenolol (TENORMIN) 25 MG tablet Take 0.5 tablets (12.5 mg total) by mouth daily. 30 tablet 0  . atorvastatin (LIPITOR) 40 MG tablet Take 1 tablet (40 mg total) by mouth daily at 6 PM. 90 tablet 2  . citalopram (CELEXA) 10 MG tablet Take 1 tablet (10 mg total) by mouth daily. 90 tablet 3  . Dulaglutide (TRULICITY) 1.5 SA/6.3KZ SOPN Trulicity 1.5 SW/1.0 mL subcutaneous pen injector    . empagliflozin (JARDIANCE) 10 MG TABS tablet Jardiance 10 mg tablet    . gabapentin (NEURONTIN) 300 MG capsule Take 1 capsule (300 mg total) by mouth 2 (two) times daily.  180 capsule 1  . glucose blood (ACCU-CHEK AVIVA PLUS) test strip Use to test blood sugar twice daily. Dx: E11.29 100 each 11  . Lancets (ACCU-CHEK MULTICLIX) lancets Use to test blood sugar twice daily. E11.29 100 each 11  . linagliptin (TRADJENTA) 5 MG TABS tablet Tradjenta 5 mg tablet    . pioglitazone (ACTOS) 15 MG tablet Take 1 tablet (15 mg total) by mouth daily. 90 tablet 3  . Vitamin D, Ergocalciferol, (DRISDOL) 1.25 MG (50000 UT) CAPS capsule Take one capsule by mouth once weekly for vitamin D 12 capsule 0   No current facility-administered medications for this visit.      Past Medical History:  Diagnosis Date  . Allergic rhinitis, cause unspecified   . Anxiety state, unspecified   . Diaphragmatic hernia without mention of obstruction or gangrene   . Diverticulosis of colon (without mention of hemorrhage)   . Female stress incontinence   . Insomnia, unspecified   . Interstitial cystitis    surgery 02/01/2013  . Irritable bowel syndrome   . Memory loss   . Other and unspecified hyperlipidemia   . Other premature beats   . Polyneuropathy in diabetes(357.2)   . Postmenopausal bleeding   . Proteinuria   . Stroke ([place])   . 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 Unspecified essential hypertension   .  Viral warts, unspecified     ROS:   All systems reviewed and negative except as noted in the HPI.   Past Surgical History:  Procedure Laterality Date  . ABDOMINAL HYSTERECTOMY  1977   Dr.Hambright  . APPENDECTOMY  1965  . CATARACT EXTRACTION, BILATERAL  02/10/2006   Dr.Groat   . COLONOSCOPY  07/17/2004   polypectomy  . [place]  . LOOP RECORDER INSERTION N/A 07/31/2016   Procedure: Loop Recorder Insertion;  Surgeon: [name], MD;  Location: [place] [facility];  Service: Cardiovascular;  Laterality: N/A;  . TEE WITHOUT CARDIOVERSION N/A 07/30/2016   Procedure:  TRANSESOPHAGEAL ECHOCARDIOGRAM (TEE);  Surgeon: [name], MD;  Location: [place];  Service: Cardiovascular;  Laterality: N/A;  . [place]  . TRIGGER FINGER RELEASE  05/13/16  . urinary bladder      surgery 02/01/2013 to stretch bladder stem     Family History  Problem Relation Age of Onset  . Cancer Mother        pancreatic  . Cancer Father        bladder  . Heart attack Father   . Hypertension Sister   . Diabetes Sister   . Diabetes Brother   . Kidney cancer Brother   . Hypertension Brother   . Diabetes Brother   . Heart disease Brother 37       Myocardial Infarction      Social History   Socioeconomic History  . Marital status: Widowed    Spouse name: Not on file  . Number of children: 1  . Years of education: Not on file  . Highest education level: Not on file  Occupational History  . 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 Financial resource strain: Not hard at all  . Food insecurity:    Worry: Never true    Inability: Never true  . Transportation needs:    Medical: No    Non-medical: No  Tobacco Use  . Smoking status: Former Smoker    Packs/day: 0.25    Years: 62.00    Pack years: 15.50    Last attempt to quit: 04/02/2011    Years since quitting: 6.9  . Smokeless tobacco: Never Used  . Tobacco comment: stopped consistently smoking in 2012 but still smokes occasionally (one pack per week)  Substance and Sexual Activity  . Alcohol use: No  . Drug use: No  . Sexual activity: Never  Lifestyle  . Physical activity:    Days per week: 7 days    Minutes per session: 10 min  . Stress: Not at all  Relationships  . Social connections:    Talks on phone: More than three times a week    Gets together: More than three times a week    Attends religious service: Never    Active member of club or organization: No    Attends meetings of clubs or organizations: Never    Relationship status:  Widowed  . Intimate partner violence:    Fear of current or ex partner: No    Emotionally abused: No    Physically abused: No    Forced sexual activity: No  Other Topics Concern  . Not on file  Social History Narrative   Ophthalmologist-Dr.Gould and Dr.Groat   Podiatrist- Dr.Tuchman   Dermatologist- Dr.Lupton   Urologist- Dr. Lawerance Bach    Lives with her son.  Ht 5\' 6"  (1.676 m)   Wt 176 lb (79.8 kg)   BMI 28.41 kg/m   Physical Exam:  Well appearing NAD HEENT: Unremarkable Neck:  No JVD, no thyromegally Lymphatics:  No adenopathy Back:  No CVA tenderness Lungs:  Clear except for some scattered rales. HEART:  Regular rate rhythm, no murmurs, no rubs, no clicks Abd:  soft, positive bowel sounds, no organomegally, no rebound, no guarding Ext:  2 plus pulses, no edema, no cyanosis, no clubbing Skin:  No rashes no nodules Neuro:  CN II through XII intact, motor grossly intact  DEVICE  Normal device function.  See PaceArt for details. PAF with pauses upto 3-4 seconds  Assess/Plan: 1. PAF - she is asymptomatic. She will continue her current meds. 2. coags - she is tolerating her systemic anti-coagulation with Eliquis. 3. Pauses - she appears to be asymptomatic. She will undergo watchful waiting. Mikle Bosworth.D.

## 2018-03-10 LAB — CUP PACEART INCLINIC DEVICE CHECK
Date Time Interrogation Session: 20191209153013
Implantable Pulse Generator Implant Date: 20180502

## 2018-03-13 ENCOUNTER — Ambulatory Visit (INDEPENDENT_AMBULATORY_CARE_PROVIDER_SITE_OTHER): Payer: Medicare Other | Admitting: Pulmonary Disease

## 2018-03-13 ENCOUNTER — Encounter: Payer: Self-pay | Admitting: Pulmonary Disease

## 2018-03-13 VITALS — BP 142/78 | HR 59 | Ht 66.0 in | Wt 177.8 lb

## 2018-03-13 DIAGNOSIS — J449 Chronic obstructive pulmonary disease, unspecified: Secondary | ICD-10-CM

## 2018-03-13 DIAGNOSIS — G4719 Other hypersomnia: Secondary | ICD-10-CM | POA: Diagnosis not present

## 2018-03-13 DIAGNOSIS — R0600 Dyspnea, unspecified: Secondary | ICD-10-CM | POA: Diagnosis not present

## 2018-03-13 NOTE — Progress Notes (Signed)
Penny Miller    [id number]    1932/03/31  Primary Care Physician:Reed, Rexene Edison, DO  Referring Physician: Lauree Chandler, NP [place], [place] [address]  Chief complaint: Consult for pulmonary hypertension  HPI: 84 year old with embolic stroke, atrial fibrillation, diabetes, hypertension, depression Admitted with acute hypoxic respiratory failure and November 2019 secondary to acute bronchitis, viral illness She was treated with antibiotics, IV Solu-Medrol and discharged on supplemental oxygen.  Echocardiogram during the stay shows pulmonary hypertension and she has been referred to [facility] for further evaluation  States that her breathing has improved since her admission.  She is weaned herself off oxygen.  Not currently using any inhalers. She has 15-pack-year smoking history.  Quit in 2013  Outpatient Encounter Medications as of 03/13/2018  Medication Sig  . albuterol (PROVENTIL HFA;VENTOLIN HFA) 108 (90 Base) MCG/ACT inhaler Inhale 2 puffs into the lungs every 6 (six) hours as needed for wheezing or shortness of breath.  Marland Kitchen amLODipine (NORVASC) 2.5 MG tablet Take one tablet twice daily for blood pressure  . apixaban (ELIQUIS) 5 MG TABS tablet Eliquis 5 mg tablet  . atenolol (TENORMIN) 25 MG tablet Take 0.5 tablets (12.5 mg total) by mouth daily.  Marland Kitchen atorvastatin (LIPITOR) 40 MG tablet Take 1 tablet (40 mg total) by mouth daily at 6 PM.  . citalopram (CELEXA) 10 MG tablet Take 1 tablet (10 mg total) by mouth daily.  Marland Kitchen gabapentin (NEURONTIN) 300 MG capsule Take 1 capsule (300 mg total) by mouth 2 (two) times daily.  Marland Kitchen glucose blood (ACCU-CHEK AVIVA PLUS) test strip Use to test blood sugar twice daily. Dx: E11.29  . Lancets (ACCU-CHEK MULTICLIX) lancets Use to test blood sugar twice daily. E11.29  . linagliptin (TRADJENTA) 5 MG TABS tablet Tradjenta 5 mg tablet  . pioglitazone (ACTOS) 15 MG tablet Take 1 tablet (15 mg total) by mouth daily.  . Vitamin D,  Ergocalciferol, (DRISDOL) 1.25 MG (50000 UT) CAPS capsule Take one capsule by mouth once weekly for vitamin D   No facility-administered encounter medications on file as of 03/13/2018.     Allergies as of 03/13/2018 - Review Complete 03/13/2018  Allergen Reaction Noted  . Jardiance [empagliflozin] Diarrhea 12/15/2017  . Metformin and related Diarrhea 06/09/2017  . Ceclor [cefaclor] Other (See Comments) 08/28/2012  . Codeine Nausea And Vomiting 08/28/2012  . Lopid [gemfibrozil] Other (See Comments) 08/28/2012  . Metoprolol Nausea And Vomiting 07/18/2015  . Morphine and related Other (See Comments) 08/28/2012  . Niacin and related Other (See Comments) 08/28/2012  . Other  03/03/2018  . Relafen [nabumetone] Nausea And Vomiting 08/28/2012  . Septra [sulfamethoxazole-trimethoprim] Nausea And Vomiting 08/28/2012  . Tetracycline Nausea And Vomiting 02/28/2015  . Toprol xl [metoprolol tartrate] Nausea And Vomiting 08/28/2012  . Adhesive [tape] Rash and Other (See Comments) 06/15/2017    Past Medical History:  Diagnosis Date  . Allergic rhinitis, cause unspecified   . Anxiety state, unspecified   . Diaphragmatic hernia without mention of obstruction or gangrene   . Diverticulosis of colon (without mention of hemorrhage)   . Female stress incontinence   . Insomnia, unspecified   . Interstitial cystitis    surgery 02/01/2013  . Irritable bowel syndrome   . Memory loss   . Other and unspecified hyperlipidemia   . Other premature beats   . Polyneuropathy in diabetes(357.2)   . Postmenopausal bleeding   . Proteinuria   . Stroke ([place])   . Type II  or unspecified type diabetes mellitus with neurological manifestations, not stated as uncontrolled(250.60)   . Unspecified essential hypertension   . Viral warts, unspecified     Past Surgical History:  Procedure Laterality Date  . ABDOMINAL HYSTERECTOMY  1977   Dr.Hambright  . APPENDECTOMY  1965  . CATARACT EXTRACTION, BILATERAL   02/10/2006   Dr.Groat   . COLONOSCOPY  07/17/2004   polypectomy  . [place]  . LOOP RECORDER INSERTION N/A 07/31/2016   Procedure: Loop Recorder Insertion;  Surgeon: [name], MD;  Location: [facility];  Service: Cardiovascular;  Laterality: N/A;  . TEE WITHOUT CARDIOVERSION N/A 07/30/2016   Procedure: TRANSESOPHAGEAL ECHOCARDIOGRAM (TEE);  Surgeon: [name], MD;  Location: [facility];  Service: Cardiovascular;  Laterality: N/A;  . [place]  . TRIGGER FINGER RELEASE  05/13/16  . urinary bladder      surgery 02/01/2013 to stretch bladder stem    Family History  Problem Relation Age of Onset  . Cancer Mother        pancreatic  . Cancer Father        bladder  . Heart attack Father   . Hypertension Sister   . Diabetes Sister   . Diabetes Brother   . Kidney cancer Brother   . Hypertension Brother   . Diabetes Brother   . Heart disease Brother 66       Myocardial Infarction     Social History   Socioeconomic History  . Marital status: Widowed    Spouse name: Not on file  . Number of children: 1  . Years of education: Not on file  . Highest education level: Not on file  Occupational History  . 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 Financial resource strain: Not hard at all  . Food insecurity:    Worry: Never true    Inability: Never true  . Transportation needs:    Medical: No    Non-medical: No  Tobacco Use  . Smoking status: Former Smoker    Packs/day: 0.25    Years: 62.00    Pack years: 15.50    Last attempt to quit: 04/02/2011    Years since quitting: 6.9  . Smokeless tobacco: Never Used  . Tobacco comment: stopped consistently smoking in 2012 but still smokes occasionally (one pack per week)  Substance and Sexual Activity  . Alcohol use: No  . Drug use: No  . Sexual activity: Never  Lifestyle  . Physical activity:    Days per week: 7 days    Minutes per  session: 10 min  . Stress: Not at all  Relationships  . Social connections:    Talks on phone: More than three times a week    Gets together: More than three times a week    Attends religious service: Never    Active member of club or organization: No    Attends meetings of clubs or organizations: Never    Relationship status: Widowed  . Intimate partner violence:    Fear of current or ex partner: No    Emotionally abused: No    Physically abused: No    Forced sexual activity: No  Other Topics Concern  . Not on file  Social History Narrative   Ophthalmologist-Dr.Gould and Dr.Groat   Podiatrist- [place]   Dermatologist- Dr.Lupton   Urologist- Dr. Lawerance Bach    Lives  with her son.     Review of systems: Review of Systems  Constitutional: Negative for fever and chills.  HENT: Negative.   Eyes: Negative for blurred vision.  Respiratory: as per HPI  Cardiovascular: Negative for chest pain and palpitations.  Gastrointestinal: Negative for vomiting, diarrhea, blood per rectum. Genitourinary: Negative for dysuria, urgency, frequency and hematuria.  Musculoskeletal: Negative for myalgias, back pain and joint pain.  Skin: Negative for itching and rash.  Neurological: Negative for dizziness, tremors, focal weakness, seizures and loss of consciousness.  Endo/Heme/Allergies: Negative for environmental allergies.  Psychiatric/Behavioral: Negative for depression, suicidal ideas and hallucinations.  All other systems reviewed and are negative.  Physical Exam: Blood pressure (!) 142/78, pulse (!) 59, height 5\' 6"  (1.676 m), weight 177 lb 12.8 oz (80.6 kg), SpO2 98 %. Gen:      No acute distress HEENT:  EOMI, sclera anicteric Neck:     No masses; no thyromegaly Lungs:    Clear to auscultation bilaterally; normal respiratory effort CV:         Regular rate and rhythm; no murmurs Abd:      + bowel sounds; soft, non-tender; no palpable masses, no distension Ext:    No edema; adequate  peripheral perfusion Skin:      Warm and dry; no rash Neuro: alert and oriented x 3 Psych: normal mood and affect  Data Reviewed: Imaging: Chest x-ray 02/02/2018-no active cardiopulmonary disease.  I have reviewed the images personally.  Cardiac: Echocardiogram 02/05/2018-normal LVEF, severely elevated right-sided pressures.  PA peak pressure of 59  Assessment:  Pulmonary hypertension Her acute increase in PA pressure may be secondary to acute hypoxic vasoconstriction in setting of bronchitis, viral illness during recent hospitalization.  She appears to have recovered well with no symptoms currently. We will check O2 levels on exertion and at night.  Evaluate for COPD and OSA with PFTs and home sleep study  No findings at present to suggest acute cor pulmonale or right heart failure She will probably need a follow-up echocardiogram once we have evaluated and treated OSA, COPD  Plan/Recommendations: - Check o2 levels on exertion - PFTs - Home sleep study.  Marshell Garfinkel MD [facility] 03/13/2018, 10:49 AM  CC: Lauree Chandler, NP

## 2018-03-13 NOTE — Patient Instructions (Signed)
We will schedule you for pulmonary function test and home sleep study for evaluation of COPD and sleep apnea We will check your oxygen levels on exertion today Follow-up in clinic after these tests for review.

## 2018-03-16 ENCOUNTER — Encounter: Payer: Self-pay | Admitting: Podiatry

## 2018-03-16 DIAGNOSIS — C44722 Squamous cell carcinoma of skin of right lower limb, including hip: Secondary | ICD-10-CM | POA: Diagnosis not present

## 2018-03-16 NOTE — Progress Notes (Signed)
Subjective: Penny Miller presents today with history of neuropathy with cc of painful, mycotic toenails.  Pain is aggravated when wearing enclosed shoe gear and relieved with periodic professional debridement.  Patient has peripheral neuropathy managed with gabapentin.  She is also on blood thinner, Eliquis.   [name], DO is her PCP and last visit was 01/08/2018.   Current Outpatient Medications:  .  albuterol (PROVENTIL HFA;VENTOLIN HFA) 108 (90 Base) MCG/ACT inhaler, Inhale 2 puffs into the lungs every 6 (six) hours as needed for wheezing or shortness of breath., Disp: 1 Inhaler, Rfl: 0 .  amLODipine (NORVASC) 2.5 MG tablet, Take one tablet twice daily for blood pressure, Disp: 180 tablet, Rfl: 3 .  apixaban (ELIQUIS) 5 MG TABS tablet, Eliquis 5 mg tablet, Disp: , Rfl:  .  atenolol (TENORMIN) 25 MG tablet, Take 0.5 tablets (12.5 mg total) by mouth daily., Disp: 30 tablet, Rfl: 0 .  atorvastatin (LIPITOR) 40 MG tablet, Take 1 tablet (40 mg total) by mouth daily at 6 PM., Disp: 90 tablet, Rfl: 2 .  citalopram (CELEXA) 10 MG tablet, Take 1 tablet (10 mg total) by mouth daily., Disp: 90 tablet, Rfl: 3 .  gabapentin (NEURONTIN) 300 MG capsule, Take 1 capsule (300 mg total) by mouth 2 (two) times daily., Disp: 180 capsule, Rfl: 1 .  glucose blood (ACCU-CHEK AVIVA PLUS) test strip, Use to test blood sugar twice daily. Dx: E11.29, Disp: 100 each, Rfl: 11 .  Lancets (ACCU-CHEK MULTICLIX) lancets, Use to test blood sugar twice daily. E11.29, Disp: 100 each, Rfl: 11 .  linagliptin (TRADJENTA) 5 MG TABS tablet, Tradjenta 5 mg tablet, Disp: , Rfl:  .  pioglitazone (ACTOS) 15 MG tablet, Take 1 tablet (15 mg total) by mouth daily., Disp: 90 tablet, Rfl: 3 .  Vitamin D, Ergocalciferol, (DRISDOL) 1.25 MG (50000 UT) CAPS capsule, Take one capsule by mouth once weekly for vitamin D, Disp: 12 capsule, Rfl: 0  Allergies  Allergen Reactions  . Jardiance [Empagliflozin] Diarrhea  . Metformin And  Related Diarrhea  . Ceclor [Cefaclor] Other (See Comments)    Unknown reaction; not recalled  . Codeine Nausea And Vomiting  . Lopid [Gemfibrozil] Other (See Comments)    Unknown reaction; not recalled  . Metoprolol Nausea And Vomiting  . Morphine And Related Other (See Comments)    Passed out  . Niacin And Related Other (See Comments)    Unknown reaction; not recalled  . Other   . Relafen [Nabumetone] Nausea And Vomiting  . Septra [Sulfamethoxazole-Trimethoprim] Nausea And Vomiting  . Tetracycline Nausea And Vomiting  . Toprol Xl [Metoprolol Tartrate] Nausea And Vomiting  . Adhesive [Tape] Rash and Other (See Comments)    Tears the skin also    Objective:  Vascular Examination: Capillary refill time <4 seconds x 10 digits Dorsalis pedis and Posterior tibial pulses 2/4 b/l Digital hair x 10 digits was absent Skin temperature gradient WNL b/l  Dermatological Examination: Skin thin and atrophic b/l  Toenails 1-5 b/l discolored, thick, dystrophic with subungual debris and pain with palpation to nailbeds due to thickness of nails.  No open wounds.  No interdigital macerations.   Musculoskeletal: Muscle strength 5/5 to all muscle groups b/l  Normal LE joint ROM without pain or crepitus  Neurological: Protective sensation intact with 10 gram monofilament bilaterally.  Epicritic sensation present bilaterally.  Vibratory sensation intact bilaterally.   Assessment: 1. Painful onychomycosis toenails 1-5 b/l 2. NIDDM with neuropathy  Plan: 1. Toenails 1-5 b/l were debrided  in length and girth without iatrogenic bleeding. 2. Patient to continue soft, supportive shoe gear 3. Patient to report any pedal injuries to medical professional  4. Follow up 3 months. Patient/POA to call should there be a concern in the interim.

## 2018-03-20 DIAGNOSIS — J9601 Acute respiratory failure with hypoxia: Secondary | ICD-10-CM | POA: Diagnosis not present

## 2018-03-20 DIAGNOSIS — I129 Hypertensive chronic kidney disease with stage 1 through stage 4 chronic kidney disease, or unspecified chronic kidney disease: Secondary | ICD-10-CM | POA: Diagnosis not present

## 2018-03-20 DIAGNOSIS — E1122 Type 2 diabetes mellitus with diabetic chronic kidney disease: Secondary | ICD-10-CM | POA: Diagnosis not present

## 2018-03-20 DIAGNOSIS — N183 Chronic kidney disease, stage 3 (moderate): Secondary | ICD-10-CM | POA: Diagnosis not present

## 2018-03-20 DIAGNOSIS — J209 Acute bronchitis, unspecified: Secondary | ICD-10-CM | POA: Diagnosis not present

## 2018-03-20 DIAGNOSIS — E1142 Type 2 diabetes mellitus with diabetic polyneuropathy: Secondary | ICD-10-CM | POA: Diagnosis not present

## 2018-03-21 ENCOUNTER — Other Ambulatory Visit (HOSPITAL_COMMUNITY): Payer: Self-pay | Admitting: Nurse Practitioner

## 2018-03-23 DIAGNOSIS — E1142 Type 2 diabetes mellitus with diabetic polyneuropathy: Secondary | ICD-10-CM | POA: Diagnosis not present

## 2018-03-23 DIAGNOSIS — E1122 Type 2 diabetes mellitus with diabetic chronic kidney disease: Secondary | ICD-10-CM | POA: Diagnosis not present

## 2018-03-23 DIAGNOSIS — N183 Chronic kidney disease, stage 3 (moderate): Secondary | ICD-10-CM | POA: Diagnosis not present

## 2018-03-23 DIAGNOSIS — J209 Acute bronchitis, unspecified: Secondary | ICD-10-CM | POA: Diagnosis not present

## 2018-03-23 DIAGNOSIS — I129 Hypertensive chronic kidney disease with stage 1 through stage 4 chronic kidney disease, or unspecified chronic kidney disease: Secondary | ICD-10-CM | POA: Diagnosis not present

## 2018-03-23 DIAGNOSIS — J9601 Acute respiratory failure with hypoxia: Secondary | ICD-10-CM | POA: Diagnosis not present

## 2018-03-24 ENCOUNTER — Ambulatory Visit (INDEPENDENT_AMBULATORY_CARE_PROVIDER_SITE_OTHER): Payer: Medicare Other

## 2018-03-24 DIAGNOSIS — I639 Cerebral infarction, unspecified: Secondary | ICD-10-CM | POA: Diagnosis not present

## 2018-03-25 LAB — CUP PACEART REMOTE DEVICE CHECK
Date Time Interrogation Session: 20191224130913
Implantable Pulse Generator Implant Date: 20180502

## 2018-03-26 NOTE — Progress Notes (Signed)
Carelink Summary Report / Loop Recorder 

## 2018-04-02 ENCOUNTER — Other Ambulatory Visit: Payer: Self-pay | Admitting: Internal Medicine

## 2018-04-05 LAB — CUP PACEART REMOTE DEVICE CHECK
Date Time Interrogation Session: 20191121124143
Implantable Pulse Generator Implant Date: 20180502

## 2018-04-13 ENCOUNTER — Other Ambulatory Visit: Payer: Medicare Other

## 2018-04-14 DIAGNOSIS — G4733 Obstructive sleep apnea (adult) (pediatric): Secondary | ICD-10-CM | POA: Diagnosis not present

## 2018-04-16 ENCOUNTER — Other Ambulatory Visit: Payer: Self-pay | Admitting: *Deleted

## 2018-04-16 ENCOUNTER — Telehealth: Payer: Self-pay | Admitting: Pulmonary Disease

## 2018-04-16 ENCOUNTER — Ambulatory Visit: Payer: Medicare Other | Admitting: Internal Medicine

## 2018-04-16 DIAGNOSIS — G4733 Obstructive sleep apnea (adult) (pediatric): Secondary | ICD-10-CM

## 2018-04-16 DIAGNOSIS — G4719 Other hypersomnia: Secondary | ICD-10-CM

## 2018-04-16 DIAGNOSIS — G4734 Idiopathic sleep related nonobstructive alveolar hypoventilation: Secondary | ICD-10-CM

## 2018-04-16 NOTE — Telephone Encounter (Signed)
Patient is a Dr. Vaughan Browner patient but her HST was read by Dr. Elsworth Soho. Per RA, HST showed mild OSA with hypopneas causing severe oxygen desaturation.   I will place a copy of the HST with the raw data in Dr. Matilde Bash cubby to review.   Will route to him so he is aware.

## 2018-04-17 ENCOUNTER — Other Ambulatory Visit: Payer: Self-pay | Admitting: *Deleted

## 2018-04-17 MED ORDER — ATORVASTATIN CALCIUM 40 MG PO TABS: 40.0000 mg | ORAL_TABLET | Freq: Every day | ORAL | 1 refills | Status: DC

## 2018-04-17 MED ORDER — VITAMIN D (ERGOCALCIFEROL) 1.25 MG (50000 UNIT) PO CAPS: ORAL_CAPSULE | ORAL | 0 refills | Status: DC

## 2018-04-17 MED ORDER — GABAPENTIN 300 MG PO CAPS: 300.0000 mg | ORAL_CAPSULE | Freq: Two times a day (BID) | ORAL | 1 refills | Status: DC

## 2018-04-17 MED ORDER — GLUCOSE BLOOD VI STRP: ORAL_STRIP | 3 refills | Status: DC

## 2018-04-17 NOTE — Telephone Encounter (Signed)
Patient called and requested refill.  Patient pharmacy changed to [facility].   Pended Rx and sent to Dr. Mariea Clonts for approval due to [place].

## 2018-04-19 MED ORDER — ATENOLOL 25 MG PO TABS: 12.5000 mg | ORAL_TABLET | Freq: Every day | ORAL | 1 refills | Status: DC

## 2018-04-22 ENCOUNTER — Telehealth: Payer: Self-pay | Admitting: Internal Medicine

## 2018-04-22 NOTE — Telephone Encounter (Signed)
Pt was implanted for cryptogenic stroke with AF found + Eliquis. Pt does not have a symptom activator, pt had a normal summary report on 03/23/2018 that was processed with no pause or brady episodes.

## 2018-04-22 NOTE — Telephone Encounter (Signed)
Pt called to report that she had a syncopal episode on 03/13/18 at 10:30am... She says that she was at Dr. Matilde Bash office with [facility] for an office visit and before she checked in she got dizzy and fell to the ground.. she denies having palpitations, chest pain and really does not remember much about it.. She says another woman was in the lobby with her and helped her up but neither of them advised someone that it happened.  She says she thinks she was only down for a few seconds and believes that she did lose consciousness. She also did not report it to Dr. Vaughan Browner, when she saw him because she says she felt alright.. Her VS at that OV were... BP 142/78 and HR 59... today she does not know her BP but her HR is 53... She says she has had other episodes of feeling to pass out but has not since then.  I advised her that I will forward to [name] for his further recommendations and to our device clinic for review of her Loop recorder for that date.

## 2018-04-22 NOTE — Telephone Encounter (Signed)
New Message         Patient called today to let [name] know that she passed out on 03/03/2018. Patient states she was told to call and let [name] know if this happens.

## 2018-04-23 NOTE — Telephone Encounter (Signed)
Call placed to Pt.  Advised per review of Pt loop no episodes of brady or pauses.  [name] reviewed.  Per [name]-  1.  Increase fluids and salt  2. Avoid caffeine.   Pt notified.  Pt indicates understanding.  Advised to call if she had any further issues.

## 2018-04-27 ENCOUNTER — Ambulatory Visit (INDEPENDENT_AMBULATORY_CARE_PROVIDER_SITE_OTHER): Payer: Medicare Other

## 2018-04-27 ENCOUNTER — Other Ambulatory Visit: Payer: Medicare Other

## 2018-04-27 DIAGNOSIS — E1129 Type 2 diabetes mellitus with other diabetic kidney complication: Secondary | ICD-10-CM | POA: Diagnosis not present

## 2018-04-27 DIAGNOSIS — I639 Cerebral infarction, unspecified: Secondary | ICD-10-CM

## 2018-04-28 LAB — BASIC METABOLIC PANEL
BUN/Creatinine Ratio: 15 (calc) (ref 6–22)
BUN: 19 mg/dL (ref 7–25)
CO2: 27 mmol/L (ref 20–32)
Calcium: 9.6 mg/dL (ref 8.6–10.4)
Chloride: 104 mmol/L (ref 98–110)
Creat: 1.3 mg/dL — ABNORMAL HIGH (ref 0.60–0.88)
Glucose, Bld: 132 mg/dL — ABNORMAL HIGH (ref 65–99)
Potassium: 4.8 mmol/L (ref 3.5–5.3)
Sodium: 137 mmol/L (ref 135–146)

## 2018-04-28 LAB — HEMOGLOBIN A1C
Hgb A1c MFr Bld: 6.9 % of total Hgb — ABNORMAL HIGH (ref <5.7)
Mean Plasma Glucose: 151 (calc)
eAG (mmol/L): 8.4 (calc)

## 2018-04-28 LAB — CBC WITH DIFFERENTIAL/PLATELET
Absolute Monocytes: 976 cells/uL — ABNORMAL HIGH (ref 200–950)
Basophils Absolute: 57 cells/uL (ref 0–200)
Basophils Relative: 0.7 %
Eosinophils Absolute: 238 cells/uL (ref 15–500)
Eosinophils Relative: 2.9 %
HCT: 35.4 % (ref 35.0–45.0)
Hemoglobin: 11.6 g/dL — ABNORMAL LOW (ref 11.7–15.5)
Lymphs Abs: 1410 cells/uL (ref 850–3900)
MCH: 30.4 pg (ref 27.0–33.0)
MCHC: 32.8 g/dL (ref 32.0–36.0)
MCV: 92.7 fL (ref 80.0–100.0)
MPV: 12.2 fL (ref 7.5–12.5)
Monocytes Relative: 11.9 %
Neutro Abs: 5519 cells/uL (ref 1500–7800)
Neutrophils Relative %: 67.3 %
Platelets: 189 10*3/uL (ref 140–400)
RBC: 3.82 10*6/uL (ref 3.80–5.10)
RDW: 13.9 % (ref 11.0–15.0)
Total Lymphocyte: 17.2 %
WBC: 8.2 10*3/uL (ref 3.8–10.8)

## 2018-04-28 LAB — CUP PACEART REMOTE DEVICE CHECK
Date Time Interrogation Session: 20200126130830
MDC IDC PG IMPLANT DT: 20180502

## 2018-04-28 NOTE — Progress Notes (Signed)
Carelink Summary Report / Loop Recorder 

## 2018-04-29 NOTE — Telephone Encounter (Signed)
Pt is aware of results and voiced her understanding. Order has been placed for cpap/oxygen titration. Nothing further is needed.

## 2018-04-29 NOTE — Addendum Note (Signed)
Addended by: Maryanna Shape A on: 04/29/2018 11:54 AM   Modules accepted: Orders

## 2018-04-29 NOTE — Telephone Encounter (Signed)
Home sleep study 04/14/2018  Mild sleep apnea with AHI of 10.9, significant oxygen desaturations with lowest O2 sats of 80% She spent 452 minutes less than 90%  Please order CPAP, O2 titration in sleep lab  Marshell Garfinkel MD [facility] 04/29/2018, 11:49 AM

## 2018-05-04 ENCOUNTER — Encounter: Payer: Self-pay | Admitting: Internal Medicine

## 2018-05-04 ENCOUNTER — Ambulatory Visit (INDEPENDENT_AMBULATORY_CARE_PROVIDER_SITE_OTHER): Payer: Medicare Other | Admitting: Internal Medicine

## 2018-05-04 VITALS — BP 130/72 | HR 63 | Temp 98.0°F | Ht 66.0 in | Wt 182.0 lb

## 2018-05-04 DIAGNOSIS — R001 Bradycardia, unspecified: Secondary | ICD-10-CM | POA: Diagnosis not present

## 2018-05-04 DIAGNOSIS — I639 Cerebral infarction, unspecified: Secondary | ICD-10-CM | POA: Diagnosis not present

## 2018-05-04 DIAGNOSIS — R151 Fecal smearing: Secondary | ICD-10-CM

## 2018-05-04 DIAGNOSIS — I480 Paroxysmal atrial fibrillation: Secondary | ICD-10-CM | POA: Diagnosis not present

## 2018-05-04 DIAGNOSIS — N183 Chronic kidney disease, stage 3 unspecified: Secondary | ICD-10-CM

## 2018-05-04 DIAGNOSIS — J209 Acute bronchitis, unspecified: Secondary | ICD-10-CM | POA: Diagnosis not present

## 2018-05-04 DIAGNOSIS — J440 Chronic obstructive pulmonary disease with acute lower respiratory infection: Secondary | ICD-10-CM | POA: Diagnosis not present

## 2018-05-04 DIAGNOSIS — E1129 Type 2 diabetes mellitus with other diabetic kidney complication: Secondary | ICD-10-CM | POA: Diagnosis not present

## 2018-05-04 MED ORDER — AZITHROMYCIN 250 MG PO TABS: ORAL_TABLET | ORAL | 0 refills | Status: DC

## 2018-05-04 NOTE — Progress Notes (Signed)
Location:  [facility] clinic Provider:  Tamario Heal L. Mariea Clonts, D.O., C.M.D.  Goals of Care:  Advanced Directives 02/13/2018  Does Patient Have a Medical Advance Directive? No  Type of Advance Directive -  Does patient want to make changes to medical advance directive? -  Copy of [place] in Chart? -  Would patient like information on creating a medical advance directive? -     Chief Complaint  Patient presents with  . Medical Management of Chronic Issues    23mth follow-up, cough x1week    HPI: Patient is a 85 y.o. female seen today for medical management of chronic diseases.    She's still congested.  She's been taking mucinex--likes the liquid rather than the pills.  She has coughed and it's down in her chest.  She has not been feeling good.  Says this is going on since she was here this last time.    She went to Dr. Vaughan Browner at Pulmonary.  It had been raining.  She was taking her umbrella down.  She turned and she passed out in the waiting room.  She knows she hit the floor.  She had no major injury, but she says she had passed out.  HR was not notably low at the appt.    Went to [name] before that to where her HR was dangerously low overnight.    Dr. Vaughan Browner wanted her to have a sleep study.  Her sats were as low as 80% at night.  She felt like she couldn't breathe thru her nose with the nocturnal sleep study which had a nasal canula w/o oxygen in her nose.  When she checks her oxygen it's 96-98%.  She keeps saying her cold made her congested and that's what the problem is.   She does not like the idea of a mask over her face (like for sleep apnea).  She did not tolerate bipap well at the hospital.    She hurt her back and it's sore on her whole butt.  Right side has bothered her from her discs before.    Has PFTs coming up March 8th. Has used nebulizer at home that hasn't gotten rid of it.  She claims it makes her sick and she gets more colds.  She is not using albuterol  due to not being able to when her nocturnal.     Past Medical History:  Diagnosis Date  . Allergic rhinitis, cause unspecified   . Anxiety state, unspecified   . Diaphragmatic hernia without mention of obstruction or gangrene   . Diverticulosis of colon (without mention of hemorrhage)   . Female stress incontinence   . Insomnia, unspecified   . Interstitial cystitis    surgery 02/01/2013  . Irritable bowel syndrome   . Memory loss   . Other and unspecified hyperlipidemia   . Other premature beats   . Polyneuropathy in diabetes(357.2)   . Postmenopausal bleeding   . Proteinuria   . Stroke ([place])   . 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 Unspecified essential hypertension   . Viral warts, unspecified     Past Surgical History:  Procedure Laterality Date  . ABDOMINAL HYSTERECTOMY  1977   Dr.Hambright  . APPENDECTOMY  1965  . CATARACT EXTRACTION, BILATERAL  02/10/2006   Dr.Groat   . COLONOSCOPY  07/17/2004   polypectomy  . [place]  . LOOP RECORDER INSERTION  N/A 07/31/2016   Procedure: Loop Recorder Insertion;  Surgeon: [name], MD;  Location: [facility];  Service: Cardiovascular;  Laterality: N/A;  . TEE WITHOUT CARDIOVERSION N/A 07/30/2016   Procedure: TRANSESOPHAGEAL ECHOCARDIOGRAM (TEE);  Surgeon: [name], MD;  Location: [facility];  Service: Cardiovascular;  Laterality: N/A;  . [place]  . TRIGGER FINGER RELEASE  05/13/16  . urinary bladder      surgery 02/01/2013 to stretch bladder stem    Allergies  Allergen Reactions  . Jardiance [Empagliflozin] Diarrhea  . Metformin And Related Diarrhea  . Ceclor [Cefaclor] Other (See Comments)    Unknown reaction; not recalled  . Codeine Nausea And Vomiting  . Lopid [Gemfibrozil] Other (See Comments)    Unknown reaction; not recalled  . Metoprolol Nausea And Vomiting  .  Morphine And Related Other (See Comments)    Passed out  . Niacin And Related Other (See Comments)    Unknown reaction; not recalled  . Other   . Relafen [Nabumetone] Nausea And Vomiting  . Septra [Sulfamethoxazole-Trimethoprim] Nausea And Vomiting  . Tetracycline Nausea And Vomiting  . Toprol Xl [Metoprolol Tartrate] Nausea And Vomiting  . Adhesive [Tape] Rash and Other (See Comments)    Tears the skin also    Outpatient Encounter Medications as of 05/04/2018  Medication Sig  . albuterol (PROVENTIL HFA;VENTOLIN HFA) 108 (90 Base) MCG/ACT inhaler Inhale 2 puffs into the lungs every 6 (six) hours as needed for wheezing or shortness of breath.  Marland Kitchen amLODipine (NORVASC) 2.5 MG tablet Take one tablet twice daily for blood pressure  . atenolol (TENORMIN) 25 MG tablet Take 0.5 tablets (12.5 mg total) by mouth daily.  Marland Kitchen atorvastatin (LIPITOR) 40 MG tablet Take 1 tablet (40 mg total) by mouth daily at 6 PM.  . citalopram (CELEXA) 10 MG tablet Take 1 tablet (10 mg total) by mouth daily.  Marland Kitchen ELIQUIS 5 MG TABS tablet TAKE 1 TABLET TWICE A DAY  . gabapentin (NEURONTIN) 300 MG capsule Take 1 capsule (300 mg total) by mouth 2 (two) times daily.  Marland Kitchen glucose blood (ACCU-CHEK AVIVA PLUS) test strip Use to test blood sugar twice daily. Dx: E11.29  . Lancets (ACCU-CHEK MULTICLIX) lancets Use to test blood sugar twice daily. E11.29  . linagliptin (TRADJENTA) 5 MG TABS tablet Tradjenta 5 mg tablet  . pioglitazone (ACTOS) 15 MG tablet Take 1 tablet (15 mg total) by mouth daily.  . Vitamin D, Ergocalciferol, (DRISDOL) 1.25 MG (50000 UT) CAPS capsule Take one capsule by mouth once weekly for vitamin D   No facility-administered encounter medications on file as of 05/04/2018.     Review of Systems:  Review of Systems  Constitutional: Negative for chills and fever.  HENT: Negative for hearing loss.   Eyes: Negative for blurred vision.  Respiratory: Positive for cough, sputum production and wheezing. Negative for  shortness of breath.   Cardiovascular: Negative for chest pain, palpitations and leg swelling.  Gastrointestinal: Negative for abdominal pain, blood in stool, constipation, diarrhea and melena.       Still urinates and also has a little bit bm at the same time  Genitourinary: Negative for dysuria.  Musculoskeletal: Positive for back pain. Negative for falls and joint pain.    Health Maintenance  Topic Date Due  . PNA vac Low Risk Adult (2 of 2 - PPSV23) 05/07/2017  . TETANUS/TDAP  09/15/2018  . OPHTHALMOLOGY EXAM  09/20/2018  . HEMOGLOBIN A1C  10/26/2018  .  FOOT EXAM  12/16/2018  . URINE MICROALBUMIN  12/17/2018  . INFLUENZA VACCINE  Completed  . DEXA SCAN  Completed    Physical Exam: Vitals:   05/04/18 1312  BP: 130/72  Pulse: 63  Temp: 98 F (36.7 C)  TempSrc: Oral  SpO2: 97%  Weight: 182 lb (82.6 kg)  Height: 5\' 6"  (1.676 m)   Body mass index is 29.38 kg/m. Physical Exam Vitals signs reviewed.  Constitutional:      Appearance: Normal appearance.  HENT:     Head: Normocephalic and atraumatic.  Cardiovascular:     Rate and Rhythm: Normal rate and regular rhythm.     Pulses: Normal pulses.     Heart sounds: Normal heart sounds.  Pulmonary:     Effort: Pulmonary effort is normal. No respiratory distress.     Breath sounds: No stridor. Wheezing present.  Abdominal:     General: Bowel sounds are normal.  Musculoskeletal: Normal range of motion.  Skin:    General: Skin is warm and dry.     Coloration: Skin is pale.  Neurological:     General: No focal deficit present.     Mental Status: She is alert and oriented to person, place, and time.     Comments: dysarthria  Psychiatric:        Mood and Affect: Mood normal.     Labs reviewed: Basic Metabolic Panel: Recent Labs    02/02/18 0534  02/04/18 0553 02/05/18 0612 02/13/18 1122 04/27/18 0931  NA 137   < > 138 138 138 137  K 3.9   < > 4.1 3.8 3.8 4.8  CL 105   < > 109 108 98 104  CO2 25   < > 23 24  33* 27  GLUCOSE 173*   < > 162* 161* 91 132*  BUN 50*   < > 44* 38* 27* 19  CREATININE 1.89*   < > 1.56* 1.39* 1.51* 1.30*  CALCIUM 8.7*   < > 8.2* 8.4* 9.0 9.6  MG 1.9  --  1.9  --   --   --    < > = values in this interval not displayed.   Liver Function Tests: Recent Labs    01/31/18 1347 02/01/18 0613 02/13/18 1122  AST 18 16 19   ALT 14 12 24   ALKPHOS 100 74  --   BILITOT 0.7 0.5 0.7  PROT 7.7 6.3* 5.9*  ALBUMIN 4.1 3.2*  --    Recent Labs    05/12/17 1435  LIPASE 70*  AMYLASE 33   No results for input(s): AMMONIA in the last 8760 hours. CBC: Recent Labs    02/04/18 0553 02/05/18 0612 02/13/18 1122 04/27/18 0931  WBC 12.0* 11.8* 11.9* 8.2  NEUTROABS 8.9*  --  9,353* 5,519  HGB 10.4* 10.3* 12.5 11.6*  HCT 33.8* 34.1* 38.4 35.4  MCV 96.8 97.7 92.8 92.7  PLT 146* 157 153 189   Lipid Panel: No results for input(s): CHOL, HDL, LDLCALC, TRIG, CHOLHDL, LDLDIRECT in the last 8760 hours. Lab Results  Component Value Date   HGBA1C 6.9 (H) 04/27/2018    Procedures since last visit: No results found.  Assessment/Plan 1. Acute bronchitis with COPD ([place]) -will tx as copd exacerbation; however, I'm not entirely certain this is much different than her baseline at this point - azithromycin (ZITHROMAX) 250 MG tablet; 2 tablets today, then one daily for 4 days  Dispense: 6 tablet; Refill: 0 -await PFTs with [facility] to find out if  it makes sense for her to be on regular inhalers -she is refusing CPAP for OSA/noctural hypoxia that was found on sleep study  2. Paroxysmal atrial fibrillation (HCC) -cont eliquis therapy  3. 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 Chronic kidney disease (CKD), active medical management without dialysis, stage 3 (moderate) (HCC) -renal function improved, Avoid  nephrotoxic agents like nsaids, dose adjust renally excreted meds, hydrate. - BASIC METABOLIC PANEL WITH GFR; Future  5. Bradycardia -following with [name], had syncopal episode in pulmonary office by her report, but vitals there were unremarkable  6. Fecal smearing -ongoing issue for her, unchanged  Labs/tests ordered:   Orders Placed This Encounter  Procedures  . BASIC METABOLIC PANEL WITH GFR    Standing Status:   Future    Standing Expiration Date:   05/05/2019  . Hemoglobin A1c    Standing Status:   Future    Standing Expiration Date:   05/05/2019    Next appt:  07/24/2018   Lorik Guo L. Jame Seelig, D.O. [facility] [address], [place] [address] Cell Phone (Mon-Fri 8am-5pm):  [phone] On Call:  [phone] & follow prompts after 5pm & weekends Office Phone:  [phone] Office Fax:  [phone]

## 2018-05-04 NOTE — Patient Instructions (Signed)
You seem to be having an exacerbation of COPD.   We will treat you with a zpak for this. This may help with your discolored sputum and excessive mucus. I don't expect your problem to clear up completely. We need Dr. Matilde Bash PFTs to know for sure what to do next to treat this lung problem.

## 2018-05-12 ENCOUNTER — Telehealth: Payer: Self-pay | Admitting: *Deleted

## 2018-05-12 ENCOUNTER — Telehealth: Payer: Self-pay

## 2018-05-12 NOTE — Telephone Encounter (Signed)
Patient notified and agreed. Medication list updated.  

## 2018-05-12 NOTE — Telephone Encounter (Signed)
Patient called and stated that her blood sugar has been running alittle high. She checked her medication list and saw that Penny Miller was on her list but she HAS NOT been taking it. Patient is wanting to know if this is something she should be taking. If so, She needs a Rx sent to [facility]. Please Advise.   2/11- 189am 2/10- 140am 2/09- 137am 2/08- 141am 2/07- 182pm 2/06- 173am 2/05- 196am

## 2018-05-12 NOTE — Telephone Encounter (Signed)
LVM for pt to call [facility] back regarding pause episode 04/29/2018 @ 12:24

## 2018-05-12 NOTE — Telephone Encounter (Signed)
She is supposed to be on actos.  It appears that at [facility] got incorrectly added back to her med list.  She had stopped it for some reason in the past.

## 2018-05-13 NOTE — Telephone Encounter (Signed)
Pt returned EW, RN phone call. Pt states she has been feeling dizzy lately but can not remember the exact date and time she felt dizzy. The Nurse advised her to continue to do what she been doing and if they see anything else on her monitor than someone would give her a call back with recommendations.

## 2018-05-18 DIAGNOSIS — L905 Scar conditions and fibrosis of skin: Secondary | ICD-10-CM | POA: Diagnosis not present

## 2018-05-18 DIAGNOSIS — Z85828 Personal history of other malignant neoplasm of skin: Secondary | ICD-10-CM | POA: Diagnosis not present

## 2018-05-21 ENCOUNTER — Telehealth: Payer: Self-pay | Admitting: Pulmonary Disease

## 2018-05-21 ENCOUNTER — Telehealth: Payer: Self-pay | Admitting: Diagnostic Neuroimaging

## 2018-05-21 NOTE — Telephone Encounter (Signed)
Noted, thank you. Sleep study has already been cancelled in pt's chart. Forwarding to Dr. Vaughan Browner as Juluis Rainier.

## 2018-05-21 NOTE — Telephone Encounter (Addendum)
Called grand daughter, [name] who stated the gabapentin dose isn't helping patient's pain now. She stated that Dr Leta Baptist had said in the past the dose may need to be increased. Patient is looking for new PCP at this time. [name] scheduled her for FU with Dr Leta Baptist in May, but would like to know if Gabapentin can be increased in the meantime. I advised will send Dr Leta Baptist the message. She  verbalized understanding, appreciation.

## 2018-05-21 NOTE — Telephone Encounter (Signed)
Called daughter and advised her of Dr Gladstone Lighter recommendation. She stated she is trying to get patient established with new PCP but doesn't have any appointment yet. She would like to reschedule for sooner appointment with NP. rescheduled for next wed with A Lomax, NP; advised they arrive 30 min early, bring new insurance cards, med list. She verbalized understanding, appreciation.

## 2018-05-21 NOTE — Telephone Encounter (Signed)
I reviewed chart. I had returned patient to PCP in 2018.   Last refill of gabapentin sent in by Dr. Mariea Clonts on Jan 2020. Last visit with Dr. Mariea Clonts was 05/04/18. I would recommend they discuss with Dr. Mariea Clonts about increased gabapentin until they can transition care to another PCP if that is what they want.   May offer sooner follow up with NP, and then we can manage gabapentin. I do not recommend to adjust gabapentin until we can see patient in clinic.    Penni Bombard, MD 09/18/6938, 98:28 AM Certified in Neurology, Neurophysiology and Neuroimaging  [facility] [address], [place], [place] [address] [phone]

## 2018-05-21 NOTE — Telephone Encounter (Signed)
Pt granddaughter(on DPR) has called to inform that based on suggestion made by Dr Leta Baptist about the gabapentin (NEURONTIN) 300 MG capsule there is a need of an increase.  Pt no longer see's Dr.[name] Reed.  Granddaughter asking if Dr Leta Baptist will manage pt's gabapentin (NEURONTIN) 300 MG capsule .  Pt has accepted 1st available office visit and is on wait list.  Granddaughter asking to be called re: her wanting to know if Dr Leta Baptist could call in an amount of  gabapentin (NEURONTIN) 300 MG capsule due to pain pt is in.  Please call

## 2018-05-26 DIAGNOSIS — M5416 Radiculopathy, lumbar region: Secondary | ICD-10-CM | POA: Insufficient documentation

## 2018-05-26 DIAGNOSIS — M48062 Spinal stenosis, lumbar region with neurogenic claudication: Secondary | ICD-10-CM | POA: Insufficient documentation

## 2018-05-26 NOTE — Progress Notes (Signed)
PATIENT: Penny Miller DOB: 05-Oct-1931  REASON FOR VISIT: follow up HISTORY FROM: patient  Chief Complaint  Patient presents with  . Follow-up    Yearly follow up. Granddaughter present. New room. Patinet's granddaughter mentioned that her back pain has came. She stated that she has been having good and bad days. She mentioned that she wants to discuss increasing her Gabapentin.       HISTORY OF PRESENT ILLNESS: Today 05/27/18 Penny Miller is a 85 y.o. female here today for follow up of left leg pain post stroke in 2018. Pain is described as an achy pain that is located in bilateral lumbar region. It is worse with sitting for extended periods. She does feel a catch in her back with walking but can walk in grocery store with buggy. She states that the pain is different from the previous leg pain but she does continue to have some shooting pains bilaterally. She has to constantly change positions while sitting and sleeping. She feels it is worse in the mornings before stretching. She has been concerned over the past 3 months as she has noted her gait is different. She had a near fall in the doctor's office after standing and getting a shooting pain in her back. She has participated in PT post stroke but not recently. She denies numbness or tingling of extremities. MRI in 2018 showed severe degenerative changes of lumbar spine. She was advised to consider injection therapy but has been hesitant due to her age and taking blood thinners.   She was started on gabapentin 100mg  TID that did not offer much relief. In 03/2017 dose was increased to 300mg  BID. She reports that this did offer relief for a period of time but now pain has worsened. She was advised to consider   She continues Eliquis for afib. She is not taking NSAIDs OTC. She is taking atorvastatin for HLD and stroke prevention.    HISTORY: (copied from Dr Gladstone Lighter note on 03/28/2017) UPDATE (03/28/17, VRP): Since last visit, doing  well overall. Tolerating meds (eliquis and gabapentin). No alleviating or aggravating factors. [name], but no major injuries. Feels well now.   PRIOR HPI (09/03/16): 85 year old female with hypertension, diabetes, hypercholesteremia, [name], developed new onset of left leg pain from left foot to left hip and back, with numbness and electrical shooting pain, 1 week after discharge. Patient had been doing 1 week of outpatient physical therapy when symptoms started. Symptoms worse with activity and standing. Alleviated with change in position. Patient tried to reduce physical activity slightly but symptoms of pain in left leg continued. Patient has history of low back pain radiating to the right leg in the past. Patient was prescribed gabapentin 100 mg at bedtime without relief. Also patient has now been diagnosed with atrial fibrillation based on cardiac heart monitor and has been on anticoagulation with Eliquis since 08/27/16.  DISCHARGE SUMMARY (07/31/16, Curly Rim): "85 year old Caucasian female with a past medical history of hypertension, chronic kidney disease stage II, hyperlipidemia on statin, type 2 diabetes, dementia, presented with left lower extremity weakness and dizziness. MRI showed left cerebellar stroke. Patient was hospitalized. She was outside the TPA window as determined by neurology."  REVIEW OF SYSTEMS: Out of a complete 14 system review of symptoms, the patient complains only of the following symptoms, appetite change, fatigue, unexpected weight change, eye discharge, blurry vision, excessive eating, daytime sleepiness, walking difficulty,  dizziness and all other reviewed systems are negative.  ALLERGIES: Allergies  Allergen Reactions  . Jardiance [Empagliflozin] Diarrhea  . Metformin And Related Diarrhea  . Ceclor [Cefaclor] Other (See Comments)    Unknown reaction; not recalled  .  Codeine Nausea And Vomiting  . Lopid [Gemfibrozil] Other (See Comments)    Unknown reaction; not recalled  . Metoprolol Nausea And Vomiting  . Morphine And Related Other (See Comments)    Passed out  . Niacin And Related Other (See Comments)    Unknown reaction; not recalled  . Other   . Relafen [Nabumetone] Nausea And Vomiting  . Septra [Sulfamethoxazole-Trimethoprim] Nausea And Vomiting  . Tetracycline Nausea And Vomiting  . Toprol Xl [Metoprolol Tartrate] Nausea And Vomiting  . Adhesive [Tape] Rash and Other (See Comments)    Tears the skin also    HOME MEDICATIONS: Outpatient Medications Prior to Visit  Medication Sig Dispense Refill  . albuterol (PROVENTIL HFA;VENTOLIN HFA) 108 (90 Base) MCG/ACT inhaler Inhale 2 puffs into the lungs every 6 (six) hours as needed for wheezing or shortness of breath. 1 Inhaler 0  . amLODipine (NORVASC) 2.5 MG tablet Take one tablet twice daily for blood pressure 180 tablet 3  . atenolol (TENORMIN) 25 MG tablet Take 0.5 tablets (12.5 mg total) by mouth daily. 45 tablet 1  . atorvastatin (LIPITOR) 40 MG tablet Take 1 tablet (40 mg total) by mouth daily at 6 PM. 90 tablet 1  . azithromycin (ZITHROMAX) 250 MG tablet 2 tablets today, then one daily for 4 days 6 tablet 0  . citalopram (CELEXA) 10 MG tablet Take 1 tablet (10 mg total) by mouth daily. (Patient taking differently: Take 5 mg by mouth daily. ) 90 tablet 3  . ELIQUIS 5 MG TABS tablet TAKE 1 TABLET TWICE A DAY 180 tablet 2  . glucose blood (ACCU-CHEK AVIVA PLUS) test strip Use to test blood sugar twice daily. Dx: E11.29 300 each 3  . Lancets (ACCU-CHEK MULTICLIX) lancets Use to test blood sugar twice daily. E11.29 100 each 11  . pioglitazone (ACTOS) 15 MG tablet Take 1 tablet (15 mg total) by mouth daily. 90 tablet 3  . Vitamin D, Ergocalciferol, (DRISDOL) 1.25 MG (50000 UT) CAPS capsule Take one capsule by mouth once weekly for vitamin D 12 capsule 0  . gabapentin (NEURONTIN) 300 MG capsule  Take 1 capsule (300 mg total) by mouth 2 (two) times daily. 180 capsule 1   No facility-administered medications prior to visit.     PAST MEDICAL HISTORY: Past Medical History:  Diagnosis Date  . Allergic rhinitis, cause unspecified   . Anxiety state, unspecified   . Diaphragmatic hernia without mention of obstruction or gangrene   . Diverticulosis of colon (without mention of hemorrhage)   . Female stress incontinence   . Insomnia, unspecified   . Interstitial cystitis    surgery 02/01/2013  . Irritable bowel syndrome   . Memory loss   . Other and unspecified hyperlipidemia   . Other premature beats   . Polyneuropathy in diabetes(357.2)   . Postmenopausal bleeding   . Proteinuria   . Stroke ([place])   . 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 Unspecified essential hypertension   . Viral warts, unspecified     PAST SURGICAL HISTORY: Past Surgical History:  Procedure Laterality Date  . ABDOMINAL HYSTERECTOMY  1977   Dr.Hambright  . APPENDECTOMY  1965  . CATARACT EXTRACTION, BILATERAL  02/10/2006  Dr.Groat   . COLONOSCOPY  07/17/2004   polypectomy  . [place]  . LOOP RECORDER INSERTION N/A 07/31/2016   Procedure: Loop Recorder Insertion;  Surgeon: [name], MD;  Location: [facility];  Service: Cardiovascular;  Laterality: N/A;  . TEE WITHOUT CARDIOVERSION N/A 07/30/2016   Procedure: TRANSESOPHAGEAL ECHOCARDIOGRAM (TEE);  Surgeon: [name], MD;  Location: [facility];  Service: Cardiovascular;  Laterality: N/A;  . [place]  . TRIGGER FINGER RELEASE  05/13/16  . urinary bladder      surgery 02/01/2013 to stretch bladder stem    FAMILY HISTORY: Family History  Problem Relation Age of Onset  . Cancer Mother        pancreatic  . Cancer Father        bladder  . Heart attack Father   . Hypertension Sister   . Diabetes  Sister   . Diabetes Brother   . Kidney cancer Brother   . Hypertension Brother   . Diabetes Brother   . Heart disease Brother 68       Myocardial Infarction     SOCIAL HISTORY: Social History   Socioeconomic History  . Marital status: Widowed    Spouse name: Not on file  . Number of children: 1  . Years of education: Not on file  . Highest education level: Not on file  Occupational History  . 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 Financial resource strain: Not hard at all  . Food insecurity:    Worry: Never true    Inability: Never true  . Transportation needs:    Medical: No    Non-medical: No  Tobacco Use  . Smoking status: Former Smoker    Packs/day: 0.25    Years: 62.00    Pack years: 15.50    Last attempt to quit: 04/02/2011    Years since quitting: 7.1  . Smokeless tobacco: Never Used  . Tobacco comment: stopped consistently smoking in 2012 but still smokes occasionally (one pack per week)  Substance and Sexual Activity  . Alcohol use: No  . Drug use: No  . Sexual activity: Never  Lifestyle  . Physical activity:    Days per week: 7 days    Minutes per session: 10 min  . Stress: Not at all  Relationships  . Social connections:    Talks on phone: More than three times a week    Gets together: More than three times a week    Attends religious service: Never    Active member of club or organization: No    Attends meetings of clubs or organizations: Never    Relationship status: Widowed  . Intimate partner violence:    Fear of current or ex partner: No    Emotionally abused: No    Physically abused: No    Forced sexual activity: No  Other Topics Concern  . Not on file  Social History Narrative   Ophthalmologist-Dr.Gould and Dr.Groat   Podiatrist- Dr.Tuchman   Dermatologist- Dr.Lupton   Urologist- Dr. Lawerance Bach    Lives with her son.       PHYSICAL EXAM  Vitals:   05/27/18 0921  BP: (!) 161/72  Pulse: (!) 59    Weight: 181 lb 3.2 oz (82.2 kg)  Height: 5\' 6"  (1.676 m)   Body mass index is 29.25 kg/m.  Generalized: Well developed, in no  acute distress  Cardiology: normal rate and rhythm, no murmur noted Neurological examination  Mentation: Alert oriented to time, place, history taking. Follows all commands speech and language fluent Cranial nerve II-XII: Pupils were equal round reactive to light. Extraocular movements were full, visual field were full on confrontational test. Facial sensation and strength were normal. Uvula tongue midline. Head turning and shoulder shrug  were normal and symmetric. Motor: The motor testing reveals 5 over 5 strength of all 4 extremities. Good symmetric motor tone is noted throughout.  Sensory: Sensory testing is intact to soft touch on all 4 extremities. No evidence of extinction is noted.  Coordination: Cerebellar testing reveals good finger-nose-finger and heel-to-shin bilaterally.  Gait and station: Gait is normal. Tandem gait is normal. Romberg is negative. No drift is seen.  Reflexes: Deep tendon reflexes are symmetric and normal bilaterally.   DIAGNOSTIC DATA (LABS, IMAGING, TESTING) - I reviewed patient records, labs, notes, testing and imaging myself where available.  MMSE - Mini Mental State Exam 07/21/2017 05/07/2016 12/21/2013  Orientation to time 5 5 5   Orientation to Place 5 4 4   Registration 3 3 3   Attention/ Calculation 5 5 5   Recall 3 3 2   Language- name 2 objects 2 2 2   Language- repeat 1 1 1   Language- follow 3 step command 3 3 3   Language- read & follow direction 1 1 1   Write a sentence 1 1 1   Copy design 1 1 1   Total score 30 29 28      Lab Results  Component Value Date   WBC 8.2 04/27/2018   HGB 11.6 (L) 04/27/2018   HCT 35.4 04/27/2018   MCV 92.7 04/27/2018   PLT 189 04/27/2018      Component Value Date/Time   NA 137 04/27/2018 0931   NA 139 06/23/2015 0927   K 4.8 04/27/2018 0931   CL 104 04/27/2018 0931   CO2 27 04/27/2018  0931   GLUCOSE 132 (H) 04/27/2018 0931   BUN 19 04/27/2018 0931   BUN 16 06/23/2015 0927   CREATININE 1.30 (H) 04/27/2018 0931   CALCIUM 9.6 04/27/2018 0931   PROT 5.9 (L) 02/13/2018 1122   PROT 6.1 06/23/2015 0927   ALBUMIN 3.2 (L) 02/01/2018 0613   ALBUMIN 3.9 06/23/2015 0927   AST 19 02/13/2018 1122   ALT 24 02/13/2018 1122   ALKPHOS 74 02/01/2018 0613   BILITOT 0.7 02/13/2018 1122   BILITOT 0.3 06/23/2015 0927   GFRNONAA 31 (L) 02/13/2018 1122   GFRAA 36 (L) 02/13/2018 1122   Lab Results  Component Value Date   CHOL 135 03/17/2017   HDL 40 (L) 03/17/2017   LDLCALC 69 03/17/2017   TRIG 179 (H) 03/17/2017   CHOLHDL 3.4 03/17/2017   Lab Results  Component Value Date   HGBA1C 6.9 (H) 04/27/2018   No results found for: VITAMINB12 Lab Results  Component Value Date   TSH 4.432 07/29/2016   MRI Lumbar Spine WO Contrast 09/13/2016 IMPRESSION:  This MRI of the lumbar spine shows severe multilevel degenerative changes as detailed above. The most significant findings are: 1.    At L2-L3, there is severe loss of disc height associated with endplate spurring and severe right facet hypertrophy with milder left facet and ligament flavum hypertrophy. There is moderately severe right lateral recess stenosis that could lead to right L3 nerve root compression.   There is no spinal stenosis. 2.    At L3-L4, there is severe spinal stenosis due to a combination of  anterolisthesis, disc protrusion and severe facet hypertrophy with ligamentum flavum hypertrophy. Despite the severity of the spinal stenosis, there is only mild foraminal and lateral recess stenosis that does not lead to nerve root compression. 3.   At L4-L5, there is moderately severe spinal stenosis due to a combination of anterolisthesis, disc protrusion and severe facet hypertrophy. There is moderately severe bilateral foraminal and moderately severe bilateral lateral recess stenosis that could lead to compression of either the L4  or L5 nerve roots. 4.    Degenerative changes at L1-L2 and L5-S1 are unlikely to lead to nerve root impingement  ASSESSMENT AND PLAN 85 y.o. year old female  has a past medical history of Allergic rhinitis, cause unspecified, Anxiety state, unspecified, Diaphragmatic hernia without mention of obstruction or gangrene, Diverticulosis of colon (without mention of hemorrhage), Female stress incontinence, Insomnia, unspecified, Interstitial cystitis, Irritable bowel syndrome, Memory loss, Other and unspecified hyperlipidemia, Other premature beats, Polyneuropathy in diabetes(357.2), Postmenopausal bleeding, Proteinuria, Stroke ([place]), Type II or 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 here with     [id number]   1. Embolic stroke involving left cerebellar artery [facility]) Z66.294 Ambulatory referral to Physical Therapy  2. Lumbar radiculopathy M54.16 gabapentin (NEURONTIN) 300 MG capsule    Ambulatory referral to Physical Therapy  3. Spinal stenosis of lumbar region, unspecified whether neurogenic claudication present M48.061 Ambulatory referral to Physical Therapy    Mrs. Brunell continues to have lumbar back pain with radicular symptoms bilaterally.  She is very hesitant to consider any type of invasive treatment due to blood thinners and her age.  She is requesting that we consider increasing gabapentin.  I have evaluated her last labs drawn in January 2020.  Creatinine is 1.3.  We will increase gabapentin to 300 mg 3 times daily.  I have advised her that we will need to consider alternative therapy should this increased dose not help.  We will also refer her to physical therapy.  Will consider referral to spine center in the future as indicated.  I have advised that she consider over-the-counter lidocaine patches and Biofreeze.  Complementary therapies with heat and ice also discussed.   I have advised her a fall precautions.  I would like for her to follow-up closely with her primary care provider to monitor her blood pressures as it is elevated today in the office she has not taken her blood pressure medication this morning.  She will monitor at home.  We will follow-up in 6 months, sooner if needed.   Orders Placed This Encounter  Procedures  . Ambulatory referral to Physical Therapy    Referral Priority:   Routine    Referral Type:   Physical Medicine    Referral Reason:   Specialty Services Required    Requested Specialty:   Physical Therapy    Number of Visits Requested:   1     Meds ordered this encounter  Medications  . gabapentin (NEURONTIN) 300 MG capsule    Sig: Take 1 capsule (300 mg total) by mouth 3 (three) times daily.    Dispense:  270 capsule    Refill:  3    Order Specific Question:   Supervising Provider    Answer:   Melvenia Beam [[id number]]      PTW SFKCL, FNP-C 05/27/2018, 10:24 AM [facility] [address] [place], [place] [address] [phone]

## 2018-05-27 ENCOUNTER — Encounter: Payer: Self-pay | Admitting: Family Medicine

## 2018-05-27 ENCOUNTER — Ambulatory Visit (INDEPENDENT_AMBULATORY_CARE_PROVIDER_SITE_OTHER): Payer: Medicare Other | Admitting: Family Medicine

## 2018-05-27 VITALS — BP 161/72 | HR 59 | Ht 66.0 in | Wt 181.2 lb

## 2018-05-27 DIAGNOSIS — I63442 Cerebral infarction due to embolism of left cerebellar artery: Secondary | ICD-10-CM | POA: Diagnosis not present

## 2018-05-27 DIAGNOSIS — I639 Cerebral infarction, unspecified: Secondary | ICD-10-CM

## 2018-05-27 DIAGNOSIS — M5416 Radiculopathy, lumbar region: Secondary | ICD-10-CM | POA: Diagnosis not present

## 2018-05-27 DIAGNOSIS — M48061 Spinal stenosis, lumbar region without neurogenic claudication: Secondary | ICD-10-CM

## 2018-05-27 MED ORDER — GABAPENTIN 300 MG PO CAPS: 300.0000 mg | ORAL_CAPSULE | Freq: Three times a day (TID) | ORAL | 3 refills | Status: DC

## 2018-05-27 NOTE — Patient Instructions (Addendum)
We will increase gabapentin to 300mg  three times a day (I feel that this is max dose for you, will need to consider other alternatives if this dose not work)  Referral to PT  Trial OTC lidocaine patches and or topical Biofreeze   Lumbosacral Radiculopathy Lumbosacral radiculopathy is a condition that involves the spinal nerves and nerve roots in the low back and bottom of the spine. The condition develops when these nerves and nerve roots move out of place or become inflamed and cause symptoms. What are the causes? This condition may be caused by:  Pressure from a disk that bulges out of place (herniated disk). A disk is a plate of soft cartilage that separates bones in the spine.  Disk changes that occur with age (disk degeneration).  A narrowing of the bones of the lower back (spinal stenosis).  A tumor.  An infection.  An injury that places sudden pressure on the disks that cushion the bones of your lower spine. What increases the risk? You are more likely to develop this condition if:  You are a female who is 12-40 years old.  You are a female who is 37-53 years old.  You use improper technique when lifting things.  You are overweight or live a sedentary lifestyle.  Your work requires frequent lifting.  You smoke.  You do repetitive activities that strain the spine. What are the signs or symptoms? Symptoms of this condition include:  Pain that goes down from your back into your legs (sciatica), usually on one side of the body. This is the most common symptom. The pain may be worse with sitting, coughing, or sneezing.  Pain and numbness in your legs.  Muscle weakness.  Tingling.  Loss of bladder control or bowel control. How is this diagnosed? This condition may be diagnosed based on:  Your symptoms and medical history.  A physical exam. If the pain is lasting, you may have tests, such as:  MRI scan.  X-ray.  CT scan.  A type of X-ray used to examine  the spinal canal after injecting a dye into your spine (myelogram).  A test to measure how electrical impulses move through a nerve (nerve conduction study). How is this treated? Treatment may depend on the cause of the condition and may include:  Working with a physical therapist.  Taking pain medicine.  Applying heat and ice to affected areas.  Doing stretches to improve flexibility.  Doing exercises to strengthen back muscles.  Having chiropractic spinal manipulation.  Using transcutaneous electrical nerve stimulation (TENS) therapy.  Getting a steroid injection in the spine. In some cases, no treatment is needed. If the condition is long-lasting (chronic), or if symptoms are severe, treatment may involve surgery or lifestyle changes, such as following a weight-loss plan. Follow these instructions at home: Activity  Avoid bending and other activities that make the problem worse.  Maintain a proper position when standing or sitting: ? When standing, keep your upper back and neck straight, with your shoulders pulled back. Avoid slouching. ? When sitting, keep your back straight and relax your shoulders. Do not round your shoulders or pull them backward.  Do not sit or stand in one place for long periods of time.  Take brief periods of rest throughout the day. This will reduce your pain. It is usually better to rest by lying down or standing, not sitting.  When you are resting for longer periods, mix in some mild activity or stretching between periods of rest.  This will help to prevent stiffness and pain.  Get regular exercise. Ask your health care provider what activities are safe for you. If you were shown how to do any exercises or stretches, do them as directed by your health care provider.  Do not lift anything that is heavier than 10 lb (4.5 kg) or the limit that you are told by your health care provider. Always use proper lifting technique, which includes: ? Bending  your knees. ? Keeping the load close to your body. ? Avoiding twisting. Managing pain  If directed, put ice on the affected area: ? Put ice in a plastic bag. ? Place a towel between your skin and the bag. ? Leave the ice on for 20 minutes, 2-3 times a day.  If directed, apply heat to the affected area as often as told by your health care provider. Use the heat source that your health care provider recommends, such as a moist heat pack or a heating pad. ? Place a towel between your skin and the heat source. ? Leave the heat on for 20-30 minutes. ? Remove the heat if your skin turns bright red. This is especially important if you are unable to feel pain, heat, or cold. You may have a greater risk of getting burned.  Take over-the-counter and prescription medicines only as told by your health care provider. General instructions  Sleep on a firm mattress in a comfortable position. Try lying on your side with your knees slightly bent. If you lie on your back, put a pillow under your knees.  Do not drive or use heavy machinery while taking prescription pain medicine.  If your health care provider prescribed a diet or exercise program, follow it as directed.  Keep all follow-up visits as told by your health care provider. This is important. Contact a health care provider if:  Your pain does not improve over time, even when taking pain medicines. Get help right away if:  You develop severe pain.  Your pain suddenly gets worse.  You develop increasing weakness in your legs.  You lose the ability to control your bladder or bowel.  You have difficulty walking or balancing.  You have a fever. Summary  Lumbosacral radiculopathy is a condition that occurs when the spinal nerves and nerve roots in the lower part of the spine move out of place or become inflamed and cause symptoms.  Symptoms include pain, numbness, and tingling that go down from your back into your legs (sciatica),  muscle weakness, and loss of bladder control or bowel control.  If directed, apply ice or heat to the affected area as told by your health care provider.  Follow instructions about activity, rest, and proper lifting technique. This information is not intended to replace advice given to you by your health care provider. Make sure you discuss any questions you have with your health care provider. Document Released: 03/18/2005 Document Revised: 03/06/2017 Document Reviewed: 03/06/2017 Elsevier Interactive Patient Education  2019 [employer]   Chronic Back Pain When back pain lasts longer than 3 months, it is called chronic back pain. Pain may get worse at certain times (flare-ups). There are things you can do at home to manage your pain. Follow these instructions at home: Activity      Avoid bending and other activities that make pain worse.  When standing: ? Keep your upper back and neck straight. ? Keep your shoulders pulled back. ? Avoid slouching.  When sitting: ? Keep your back  straight. ? Relax your shoulders. Do not round your shoulders or pull them backward.  Do not sit or stand in one place for long periods of time.  Take short rest breaks during the day. Lying down or standing is usually better than sitting. Resting can help relieve pain.  When sitting or lying down for a long time, do some mild activity or stretching. This will help to prevent stiffness and pain.  Get regular exercise. Ask your doctor what activities are safe for you.  Do not lift anything that is heavier than 10 lb (4.5 kg). To prevent injury when you lift things: ? Bend your knees. ? Keep the weight close to your body. ? Avoid twisting. Managing pain  If told, put ice on the painful area. Your doctor may tell you to use ice for 24-48 hours after a flare-up starts. ? Put ice in a plastic bag. ? Place a towel between your skin and the bag. ? Leave the ice on for 20 minutes, 2-3 times a  day.  If told, put heat on the painful area as often as told by your doctor. Use the heat source that your doctor recommends, such as a moist heat pack or a heating pad. ? Place a towel between your skin and the heat source. ? Leave the heat on for 20-30 minutes. ? Remove the heat if your skin turns bright red. This is especially important if you are unable to feel pain, heat, or cold. You may have a greater risk of getting burned.  Soak in a warm bath. This can help relieve pain.  Take over-the-counter and prescription medicines only as told by your doctor. General instructions  Sleep on a firm mattress. Try lying on your side with your knees slightly bent. If you lie on your back, put a pillow under your knees.  Keep all follow-up visits as told by your doctor. This is important. Contact a doctor if:  You have pain that does not get better with rest or medicine. Get help right away if:  One or both of your arms or legs feel weak.  One or both of your arms or legs lose feeling (numbness).  You have trouble controlling when you poop (bowel movement) or pee (urinate).  You feel sick to your stomach (nauseous).  You throw up (vomit).  You have belly (abdominal) pain.  You have shortness of breath.  You pass out (faint). Summary  When back pain lasts longer than 3 months, it is called chronic back pain.  Pain may get worse at certain times (flare-ups).  Use ice and heat as told by your doctor. Your doctor may tell you to use ice after flare-ups. This information is not intended to replace advice given to you by your health care provider. Make sure you discuss any questions you have with your health care provider. Document Released: 09/04/2007 Document Revised: 10/31/2016 Document Reviewed: 10/31/2016 Elsevier Interactive Patient Education  2019 [employer].

## 2018-05-27 NOTE — Progress Notes (Signed)
I reviewed note and agree with plan.   Penni Bombard, MD 11/24/7491, 5:52 PM Certified in Neurology, Neurophysiology and Neuroimaging  [facility] [address] [place], [place] [address] [phone]

## 2018-05-29 ENCOUNTER — Telehealth: Payer: Self-pay | Admitting: Internal Medicine

## 2018-05-29 ENCOUNTER — Ambulatory Visit (INDEPENDENT_AMBULATORY_CARE_PROVIDER_SITE_OTHER): Payer: Medicare Other | Admitting: *Deleted

## 2018-05-29 DIAGNOSIS — N3010 Interstitial cystitis (chronic) without hematuria: Secondary | ICD-10-CM | POA: Diagnosis not present

## 2018-05-29 DIAGNOSIS — N3020 Other chronic cystitis without hematuria: Secondary | ICD-10-CM | POA: Diagnosis not present

## 2018-05-29 DIAGNOSIS — I639 Cerebral infarction, unspecified: Secondary | ICD-10-CM | POA: Diagnosis not present

## 2018-05-29 LAB — CUP PACEART REMOTE DEVICE CHECK
Implantable Pulse Generator Implant Date: 20180502
MDC IDC SESS DTM: 20200228105433

## 2018-05-29 NOTE — Telephone Encounter (Signed)
Dr. Jenny Reichmann approved for patient as a new patient, LVM notifying patient to call back and make appointment,  Please make app as an OV and let office know when appt has been made.

## 2018-06-01 ENCOUNTER — Telehealth: Payer: Self-pay | Admitting: *Deleted

## 2018-06-01 NOTE — Telephone Encounter (Signed)
Patient called and stated that Penny Lomax,NP placed her on Gabapentin and she feels like she had a reaction to it this past weekend. Stated it started only when she took the medication. Stated that her eyes and throat started burning, and she just didn't feel good.   I instructed her to Call [place] office to inform them since they prescribed the medication. She agreed.

## 2018-06-01 NOTE — Telephone Encounter (Signed)
Interesting since she took it for her back and tolerated it quite well before.  Noted.  I agree she needs to notify the current prescriber as you instructed.

## 2018-06-02 ENCOUNTER — Ambulatory Visit: Payer: Medicare Other | Admitting: Podiatry

## 2018-06-03 NOTE — Progress Notes (Signed)
Carelink Summary Report / Loop Recorder 

## 2018-06-04 DIAGNOSIS — M48061 Spinal stenosis, lumbar region without neurogenic claudication: Secondary | ICD-10-CM | POA: Diagnosis not present

## 2018-06-04 DIAGNOSIS — E1142 Type 2 diabetes mellitus with diabetic polyneuropathy: Secondary | ICD-10-CM | POA: Diagnosis not present

## 2018-06-04 DIAGNOSIS — I129 Hypertensive chronic kidney disease with stage 1 through stage 4 chronic kidney disease, or unspecified chronic kidney disease: Secondary | ICD-10-CM | POA: Diagnosis not present

## 2018-06-04 DIAGNOSIS — N183 Chronic kidney disease, stage 3 (moderate): Secondary | ICD-10-CM | POA: Diagnosis not present

## 2018-06-04 DIAGNOSIS — Z7984 Long term (current) use of oral hypoglycemic drugs: Secondary | ICD-10-CM | POA: Diagnosis not present

## 2018-06-04 DIAGNOSIS — Z8673 Personal history of transient ischemic attack (TIA), and cerebral infarction without residual deficits: Secondary | ICD-10-CM | POA: Diagnosis not present

## 2018-06-04 DIAGNOSIS — E1122 Type 2 diabetes mellitus with diabetic chronic kidney disease: Secondary | ICD-10-CM | POA: Diagnosis not present

## 2018-06-04 DIAGNOSIS — R399 Unspecified symptoms and signs involving the genitourinary system: Secondary | ICD-10-CM | POA: Diagnosis not present

## 2018-06-04 DIAGNOSIS — M5416 Radiculopathy, lumbar region: Secondary | ICD-10-CM | POA: Diagnosis not present

## 2018-06-04 DIAGNOSIS — Z7901 Long term (current) use of anticoagulants: Secondary | ICD-10-CM | POA: Diagnosis not present

## 2018-06-04 DIAGNOSIS — R82998 Other abnormal findings in urine: Secondary | ICD-10-CM | POA: Diagnosis not present

## 2018-06-05 ENCOUNTER — Telehealth: Payer: Self-pay | Admitting: Diagnostic Neuroimaging

## 2018-06-05 NOTE — Telephone Encounter (Signed)
Spoke with Merry Proud from [facility] to give him the verbal order for PT. He was appreciative for the call.

## 2018-06-05 NOTE — Telephone Encounter (Signed)
Yes please

## 2018-06-05 NOTE — Telephone Encounter (Signed)
Merry Proud from [facility] requesting verbal orders for PT , 1 week 4 , 1 every other week 4 for gate ,balance ,lower extremity ,and trunk strengthening   CB# [phone]

## 2018-06-08 NOTE — Telephone Encounter (Signed)
Pt contacted for visit on 06-02-18, she requested to defer until 06-05-18 as she was not feeling well and felt she may have a virus.  This will result in delay of care.  This was signed by Amy LomaxNP and faxed back with fax confirmation received 06-04-18.

## 2018-06-09 ENCOUNTER — Encounter (HOSPITAL_BASED_OUTPATIENT_CLINIC_OR_DEPARTMENT_OTHER): Payer: Medicare Other

## 2018-06-09 DIAGNOSIS — N183 Chronic kidney disease, stage 3 (moderate): Secondary | ICD-10-CM | POA: Diagnosis not present

## 2018-06-09 DIAGNOSIS — E1122 Type 2 diabetes mellitus with diabetic chronic kidney disease: Secondary | ICD-10-CM | POA: Diagnosis not present

## 2018-06-09 DIAGNOSIS — Z7901 Long term (current) use of anticoagulants: Secondary | ICD-10-CM | POA: Diagnosis not present

## 2018-06-09 DIAGNOSIS — M5416 Radiculopathy, lumbar region: Secondary | ICD-10-CM | POA: Diagnosis not present

## 2018-06-09 DIAGNOSIS — I129 Hypertensive chronic kidney disease with stage 1 through stage 4 chronic kidney disease, or unspecified chronic kidney disease: Secondary | ICD-10-CM | POA: Diagnosis not present

## 2018-06-09 DIAGNOSIS — M48061 Spinal stenosis, lumbar region without neurogenic claudication: Secondary | ICD-10-CM | POA: Diagnosis not present

## 2018-06-09 DIAGNOSIS — Z8673 Personal history of transient ischemic attack (TIA), and cerebral infarction without residual deficits: Secondary | ICD-10-CM | POA: Diagnosis not present

## 2018-06-09 DIAGNOSIS — Z7984 Long term (current) use of oral hypoglycemic drugs: Secondary | ICD-10-CM | POA: Diagnosis not present

## 2018-06-09 DIAGNOSIS — E1142 Type 2 diabetes mellitus with diabetic polyneuropathy: Secondary | ICD-10-CM | POA: Diagnosis not present

## 2018-06-16 DIAGNOSIS — E1122 Type 2 diabetes mellitus with diabetic chronic kidney disease: Secondary | ICD-10-CM | POA: Diagnosis not present

## 2018-06-16 DIAGNOSIS — Z7984 Long term (current) use of oral hypoglycemic drugs: Secondary | ICD-10-CM | POA: Diagnosis not present

## 2018-06-16 DIAGNOSIS — I129 Hypertensive chronic kidney disease with stage 1 through stage 4 chronic kidney disease, or unspecified chronic kidney disease: Secondary | ICD-10-CM | POA: Diagnosis not present

## 2018-06-16 DIAGNOSIS — Z8673 Personal history of transient ischemic attack (TIA), and cerebral infarction without residual deficits: Secondary | ICD-10-CM | POA: Diagnosis not present

## 2018-06-16 DIAGNOSIS — M5416 Radiculopathy, lumbar region: Secondary | ICD-10-CM | POA: Diagnosis not present

## 2018-06-16 DIAGNOSIS — N183 Chronic kidney disease, stage 3 (moderate): Secondary | ICD-10-CM | POA: Diagnosis not present

## 2018-06-16 DIAGNOSIS — E1142 Type 2 diabetes mellitus with diabetic polyneuropathy: Secondary | ICD-10-CM | POA: Diagnosis not present

## 2018-06-16 DIAGNOSIS — Z7901 Long term (current) use of anticoagulants: Secondary | ICD-10-CM | POA: Diagnosis not present

## 2018-06-16 DIAGNOSIS — M48061 Spinal stenosis, lumbar region without neurogenic claudication: Secondary | ICD-10-CM | POA: Diagnosis not present

## 2018-06-23 ENCOUNTER — Ambulatory Visit: Payer: Medicare Other | Admitting: Pulmonary Disease

## 2018-06-23 DIAGNOSIS — Z8673 Personal history of transient ischemic attack (TIA), and cerebral infarction without residual deficits: Secondary | ICD-10-CM | POA: Diagnosis not present

## 2018-06-23 DIAGNOSIS — M5416 Radiculopathy, lumbar region: Secondary | ICD-10-CM | POA: Diagnosis not present

## 2018-06-23 DIAGNOSIS — M48061 Spinal stenosis, lumbar region without neurogenic claudication: Secondary | ICD-10-CM | POA: Diagnosis not present

## 2018-06-23 DIAGNOSIS — E1122 Type 2 diabetes mellitus with diabetic chronic kidney disease: Secondary | ICD-10-CM | POA: Diagnosis not present

## 2018-06-23 DIAGNOSIS — I129 Hypertensive chronic kidney disease with stage 1 through stage 4 chronic kidney disease, or unspecified chronic kidney disease: Secondary | ICD-10-CM | POA: Diagnosis not present

## 2018-06-23 DIAGNOSIS — N183 Chronic kidney disease, stage 3 (moderate): Secondary | ICD-10-CM | POA: Diagnosis not present

## 2018-06-23 DIAGNOSIS — E1142 Type 2 diabetes mellitus with diabetic polyneuropathy: Secondary | ICD-10-CM | POA: Diagnosis not present

## 2018-06-23 DIAGNOSIS — Z7901 Long term (current) use of anticoagulants: Secondary | ICD-10-CM | POA: Diagnosis not present

## 2018-06-23 DIAGNOSIS — Z7984 Long term (current) use of oral hypoglycemic drugs: Secondary | ICD-10-CM | POA: Diagnosis not present

## 2018-06-28 ENCOUNTER — Other Ambulatory Visit: Payer: Self-pay | Admitting: Internal Medicine

## 2018-06-30 ENCOUNTER — Other Ambulatory Visit: Payer: Self-pay | Admitting: *Deleted

## 2018-06-30 MED ORDER — AMLODIPINE BESYLATE 2.5 MG PO TABS: 2.5000 mg | ORAL_TABLET | Freq: Every day | ORAL | 1 refills | Status: DC

## 2018-07-01 ENCOUNTER — Other Ambulatory Visit: Payer: Self-pay

## 2018-07-01 ENCOUNTER — Ambulatory Visit (INDEPENDENT_AMBULATORY_CARE_PROVIDER_SITE_OTHER): Payer: Medicare Other | Admitting: *Deleted

## 2018-07-01 DIAGNOSIS — I639 Cerebral infarction, unspecified: Secondary | ICD-10-CM

## 2018-07-02 ENCOUNTER — Ambulatory Visit: Payer: Medicare Other | Admitting: Podiatry

## 2018-07-02 LAB — CUP PACEART REMOTE DEVICE CHECK
Date Time Interrogation Session: 20200401134113
Implantable Pulse Generator Implant Date: 20180502

## 2018-07-03 DIAGNOSIS — Z8673 Personal history of transient ischemic attack (TIA), and cerebral infarction without residual deficits: Secondary | ICD-10-CM | POA: Diagnosis not present

## 2018-07-03 DIAGNOSIS — Z7984 Long term (current) use of oral hypoglycemic drugs: Secondary | ICD-10-CM | POA: Diagnosis not present

## 2018-07-03 DIAGNOSIS — N183 Chronic kidney disease, stage 3 (moderate): Secondary | ICD-10-CM | POA: Diagnosis not present

## 2018-07-03 DIAGNOSIS — I129 Hypertensive chronic kidney disease with stage 1 through stage 4 chronic kidney disease, or unspecified chronic kidney disease: Secondary | ICD-10-CM | POA: Diagnosis not present

## 2018-07-03 DIAGNOSIS — M5416 Radiculopathy, lumbar region: Secondary | ICD-10-CM | POA: Diagnosis not present

## 2018-07-03 DIAGNOSIS — E1122 Type 2 diabetes mellitus with diabetic chronic kidney disease: Secondary | ICD-10-CM | POA: Diagnosis not present

## 2018-07-03 DIAGNOSIS — E1142 Type 2 diabetes mellitus with diabetic polyneuropathy: Secondary | ICD-10-CM | POA: Diagnosis not present

## 2018-07-03 DIAGNOSIS — Z7901 Long term (current) use of anticoagulants: Secondary | ICD-10-CM | POA: Diagnosis not present

## 2018-07-03 DIAGNOSIS — M48061 Spinal stenosis, lumbar region without neurogenic claudication: Secondary | ICD-10-CM | POA: Diagnosis not present

## 2018-07-04 DIAGNOSIS — N183 Chronic kidney disease, stage 3 (moderate): Secondary | ICD-10-CM | POA: Diagnosis not present

## 2018-07-04 DIAGNOSIS — M48061 Spinal stenosis, lumbar region without neurogenic claudication: Secondary | ICD-10-CM | POA: Diagnosis not present

## 2018-07-04 DIAGNOSIS — Z8673 Personal history of transient ischemic attack (TIA), and cerebral infarction without residual deficits: Secondary | ICD-10-CM | POA: Diagnosis not present

## 2018-07-04 DIAGNOSIS — Z7984 Long term (current) use of oral hypoglycemic drugs: Secondary | ICD-10-CM | POA: Diagnosis not present

## 2018-07-04 DIAGNOSIS — E1142 Type 2 diabetes mellitus with diabetic polyneuropathy: Secondary | ICD-10-CM | POA: Diagnosis not present

## 2018-07-04 DIAGNOSIS — M5416 Radiculopathy, lumbar region: Secondary | ICD-10-CM | POA: Diagnosis not present

## 2018-07-04 DIAGNOSIS — E1122 Type 2 diabetes mellitus with diabetic chronic kidney disease: Secondary | ICD-10-CM | POA: Diagnosis not present

## 2018-07-04 DIAGNOSIS — Z7901 Long term (current) use of anticoagulants: Secondary | ICD-10-CM | POA: Diagnosis not present

## 2018-07-04 DIAGNOSIS — I129 Hypertensive chronic kidney disease with stage 1 through stage 4 chronic kidney disease, or unspecified chronic kidney disease: Secondary | ICD-10-CM | POA: Diagnosis not present

## 2018-07-06 NOTE — Progress Notes (Signed)
Carelink Summary Report / Loop Recorder 

## 2018-07-20 ENCOUNTER — Other Ambulatory Visit: Payer: Self-pay | Admitting: Internal Medicine

## 2018-07-20 DIAGNOSIS — N183 Chronic kidney disease, stage 3 (moderate): Secondary | ICD-10-CM | POA: Diagnosis not present

## 2018-07-20 DIAGNOSIS — M5416 Radiculopathy, lumbar region: Secondary | ICD-10-CM | POA: Diagnosis not present

## 2018-07-20 DIAGNOSIS — E1122 Type 2 diabetes mellitus with diabetic chronic kidney disease: Secondary | ICD-10-CM | POA: Diagnosis not present

## 2018-07-20 DIAGNOSIS — M48061 Spinal stenosis, lumbar region without neurogenic claudication: Secondary | ICD-10-CM | POA: Diagnosis not present

## 2018-07-20 DIAGNOSIS — I129 Hypertensive chronic kidney disease with stage 1 through stage 4 chronic kidney disease, or unspecified chronic kidney disease: Secondary | ICD-10-CM | POA: Diagnosis not present

## 2018-07-20 DIAGNOSIS — Z8673 Personal history of transient ischemic attack (TIA), and cerebral infarction without residual deficits: Secondary | ICD-10-CM | POA: Diagnosis not present

## 2018-07-24 ENCOUNTER — Other Ambulatory Visit: Payer: Self-pay

## 2018-07-24 ENCOUNTER — Ambulatory Visit: Payer: Self-pay

## 2018-07-24 ENCOUNTER — Ambulatory Visit (INDEPENDENT_AMBULATORY_CARE_PROVIDER_SITE_OTHER): Payer: Medicare Other | Admitting: Family

## 2018-07-24 DIAGNOSIS — Z0000 Encounter for general adult medical examination without abnormal findings: Secondary | ICD-10-CM

## 2018-07-24 NOTE — Progress Notes (Signed)
Subjective:   Penny Miller is a 85 y.o. female who presents for Medicare Annual (Subsequent) preventive examination.  Review of Systems:   Cardiac Risk Factors include: smoking/ tobacco exposure;advanced age (>22men, >83 women);diabetes mellitus;dyslipidemia;hypertension     Objective:     Vitals: There were no vitals taken for this visit.  There is no height or weight on file to calculate BMI.  Advanced Directives 07/24/2018 02/13/2018 02/01/2018 01/31/2018 11/27/2017 07/21/2017 06/15/2017  Does Patient Have a Medical Advance Directive? No No No No No No No  Type of Advance Directive - - - - - - -  Does patient want to make changes to medical advance directive? No - Patient declined - - - - - -  Copy of Press photographer in [place]  Would patient like information on creating a medical advance directive? - - No - Patient declined No - Patient declined - Yes (MAU/Ambulatory/Procedural Areas - Information given) -    Tobacco Social History   Tobacco Use  Smoking Status Former Smoker  . Packs/day: 0.25  . Years: 62.00  . Pack years: 15.50  . Last attempt to quit: 04/02/2011  . Years since quitting: 7.3  Smokeless Tobacco Never Used  Tobacco Comment   stopped consistently smoking in 2012 but still smokes occasionally (one pack per week)     Counseling given: Not Answered Comment: stopped consistently smoking in 2012 but still smokes occasionally (one pack per week)   Clinical Intake:  Pre-visit preparation completed: No  Pain : 0-10 Pain Score: 2  Pain Type: Chronic pain Pain Location: Back Pain Orientation: Lower Pain Radiating Towards: legs  Pain Descriptors / Indicators: Aching Pain Onset: Other (comment)(several years) Pain Frequency: Constant Pain Relieving Factors: rest, Therapy  Effect of Pain on Daily Activities: yes  Pain Relieving Factors: rest, Therapy   Nutritional Risks: None Diabetes: Yes CBG done?: No(135 previous night )  Did pt. bring in CBG monitor from home?: Yes(checks at home ) Glucose Meter Downloaded?: No  How often do you need to have someone help you when you read instructions, pamphlets, or other written materials from your doctor or pharmacy?: 1 - Never What is the last grade level you completed in school?: 12 grade   Interpreter Needed?: No  Information entered by ::   FNP-C   Past Medical History:  Diagnosis Date  . Allergic rhinitis, cause unspecified   . Anxiety state, unspecified   . Diaphragmatic hernia without mention of obstruction or gangrene   . Diverticulosis of colon (without mention of hemorrhage)   . Female stress incontinence   . Insomnia, unspecified   . Interstitial cystitis    surgery 02/01/2013  . Irritable bowel syndrome   . Memory loss   . Other and unspecified hyperlipidemia   . Other premature beats   . Polyneuropathy in diabetes(357.2)   . Postmenopausal bleeding   . Proteinuria   . Stroke ([place])   . 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 Unspecified essential hypertension   . Viral warts, unspecified    Past Surgical History:  Procedure Laterality Date  . ABDOMINAL HYSTERECTOMY  1977   Dr.Hambright  . APPENDECTOMY  1965  . CATARACT EXTRACTION, BILATERAL  02/10/2006   Dr.Groat   . COLONOSCOPY  07/17/2004   polypectomy  . [place]  . LOOP RECORDER INSERTION N/A 07/31/2016   Procedure: Loop  Recorder Insertion;  Surgeon: [name], MD;  Location: [facility];  Service: Cardiovascular;  Laterality: N/A;  . TEE WITHOUT CARDIOVERSION N/A 07/30/2016   Procedure: TRANSESOPHAGEAL ECHOCARDIOGRAM (TEE);  Surgeon: [name], MD;  Location: [facility];  Service: Cardiovascular;  Laterality: N/A;  . [place]  . TRIGGER FINGER RELEASE  05/13/16  . urinary bladder      surgery 02/01/2013 to stretch bladder  stem   Family History  Problem Relation Age of Onset  . Cancer Mother        pancreatic  . Cancer Father        bladder  . Heart attack Father   . Hypertension Sister   . Diabetes Sister   . Diabetes Brother   . Kidney cancer Brother   . Hypertension Brother   . Diabetes Brother   . Heart disease Brother 48       Myocardial Infarction    Social History   Socioeconomic History  . Marital status: Widowed    Spouse name: Not on file  . Number of children: 1  . Years of education: Not on file  . Highest education level: Not on file  Occupational History  . 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 Financial resource strain: Not hard at all  . Food insecurity:    Worry: Never true    Inability: Never true  . Transportation needs:    Medical: No    Non-medical: No  Tobacco Use  . Smoking status: Former Smoker    Packs/day: 0.25    Years: 62.00    Pack years: 15.50    Last attempt to quit: 04/02/2011    Years since quitting: 7.3  . Smokeless tobacco: Never Used  . Tobacco comment: stopped consistently smoking in 2012 but still smokes occasionally (one pack per week)  Substance and Sexual Activity  . Alcohol use: No  . Drug use: No  . Sexual activity: Never  Lifestyle  . Physical activity:    Days per week: 7 days    Minutes per session: 10 min  . Stress: Not at all  Relationships  . Social connections:    Talks on phone: More than three times a week    Gets together: More than three times a week    Attends religious service: Never    Active member of club or organization: No    Attends meetings of clubs or organizations: Never    Relationship status: Widowed  Other Topics Concern  . Not on file  Social History Narrative   Ophthalmologist-Dr.Gould and Dr.Groat   Podiatrist- Dr.Tuchman   Dermatologist- Dr.Lupton   Urologist- Dr. Lawerance Bach    Lives with her son.     Outpatient Encounter Medications as of 07/24/2018  Medication Sig  .  albuterol (PROVENTIL HFA;VENTOLIN HFA) 108 (90 Base) MCG/ACT inhaler Inhale 2 puffs into the lungs every 6 (six) hours as needed for wheezing or shortness of breath.  Marland Kitchen amLODipine (NORVASC) 2.5 MG tablet Take 1 tablet (2.5 mg total) by mouth daily.  Marland Kitchen atenolol (TENORMIN) 25 MG tablet Take 0.5 tablets (12.5 mg total) by mouth daily.  Marland Kitchen atorvastatin (LIPITOR) 40 MG tablet Take 1 tablet (40 mg total) by mouth daily at 6 PM.  . citalopram (CELEXA) 10 MG tablet Take 1 tablet (10 mg total) by mouth daily.  Marland Kitchen ELIQUIS 5 MG TABS tablet TAKE 1 TABLET TWICE  A DAY  . gabapentin (NEURONTIN) 300 MG capsule Take 1 capsule (300 mg total) by mouth 3 (three) times daily.  Marland Kitchen glucose blood (ACCU-CHEK AVIVA PLUS) test strip Use to test blood sugar twice daily. Dx: E11.29  . Lancets (ACCU-CHEK MULTICLIX) lancets Use to test blood sugar twice daily. E11.29  . pioglitazone (ACTOS) 15 MG tablet Take 1 tablet (15 mg total) by mouth daily.  . Vitamin D, Ergocalciferol, (DRISDOL) 1.25 MG (50000 UT) CAPS capsule TAKE 1 CAPSULE ONCE WEEKLY FOR VITAMIN D  . [DISCONTINUED] azithromycin (ZITHROMAX) 250 MG tablet 2 tablets today, then one daily for 4 days   No facility-administered encounter medications on file as of 07/24/2018.     Activities of Daily Living In your present state of health, do you have any difficulty performing the following activities: 07/24/2018 02/01/2018  Hearing? N N  Vision? N N  Difficulty concentrating or making decisions? N N  Walking or climbing stairs? Y N  Comment slows down due to back pain  -  Dressing or bathing? N N  Doing errands, shopping? Aggie Moats  Comment grand daughter assist  -  Conservation officer, nature and eating ? N -  Using the Toilet? N -  In the past six months, have you accidently leaked urine? Y -  Do you have problems with loss of bowel control? Y -  Managing your Medications? N -  Managing your Finances? N -  Housekeeping or managing your Housekeeping? N -  Some recent data might be  hidden    Patient Care Team: [name], DO as PCP - General (Geriatric Medicine) Ulice Bold, MD as Referring Physician (Dermatology) Love, Alyson Locket, MD as Consulting Physician (Neurology) Sharyne Peach, MD as Consulting Physician (Ophthalmology) Gean Birchwood, DPM as Consulting Physician (Podiatry) Ronald Lobo, MD as Consulting Physician (Gastroenterology) Lomax, Marny Lowenstein, MD (Inactive) as Consulting Physician (Gynecology) Druscilla Brownie, MD as Consulting Physician (Dermatology) Clent Jacks, MD as Consulting Physician (Ophthalmology) Myrlene Broker, MD as Attending Physician (Urology) Minus Breeding, MD as Consulting Physician (Cardiology)    Assessment:   This is a routine wellness examination for Koula.  Exercise Activities and Dietary recommendations Current Exercise Habits: Home exercise routine, Type of exercise: walking, Time (Minutes): 10, Frequency (Times/Week): 7, Weekly Exercise (Minutes/Week): 70, Intensity: Mild, Exercise limited by: Other - see comments(back pain )  Goals    . Weight (lb) < 165 lb (74.8 kg)      07/24/2018 would like to decrease weight to 165 lbs  Starting 05/07/2016, I will attempt to decrease my weight to reach my goal weight of 165 lbs.       Fall Risk Fall Risk  07/24/2018 03/05/2018 02/13/2018 12/15/2017 10/01/2017  Falls in the past year? 0 0 0 No No  Number falls in past yr: 0 0 - - -  Comment - - - - -  Injury with Fall? 0 0 - - -  Comment - - - - -  Risk for fall due to : - - - - -  Risk for fall due to: Comment - - - - -  Follow up - - - - -   Is the patient's home free of loose throw rugs in walkways, pet beds, electrical cords, etc?   no      Grab bars in the bathroom? yes      Handrails on the stairs?   yes      Adequate lighting?   yes  Depression Screen PHQ  2/9 Scores 07/24/2018 03/05/2018 12/15/2017 07/21/2017  PHQ - 2 Score 0 0 0 0     Cognitive Function MMSE - Mini Mental State Exam 07/21/2017  05/07/2016 12/21/2013  Orientation to time 5 5 5   Orientation to Place 5 4 4   Registration 3 3 3   Attention/ Calculation 5 5 5   Recall 3 3 2   Language- name 2 objects 2 2 2   Language- repeat 1 1 1   Language- follow 3 step command 3 3 3   Language- read & follow direction 1 1 1   Write a sentence 1 1 1   Copy design 1 1 1   Total score 30 29 28      6CIT Screen 07/24/2018  What Year? 0 points  What month? 0 points  What time? 0 points  Count back from 20 0 points  Months in reverse 0 points  Repeat phrase 0 points  Total Score 0    Immunization History  Administered Date(s) Administered  . Influenza, High Dose Seasonal PF 12/31/2016, 12/15/2017  . Influenza,inj,Quad PF,6+ Mos 12/15/2012, 12/21/2013, 02/28/2015, 02/02/2016  . Influenza-Unspecified 01/22/2012  . Pneumococcal Conjugate-13 05/07/2016  . Pneumococcal-Unspecified 01/24/1995  . Td 09/14/2008    Qualifies for Shingles Vaccine? Plans to get but not now   Screening Tests Health Maintenance  Topic Date Due  . PNA vac Low Risk Adult (2 of 2 - PPSV23) 05/07/2017  . TETANUS/TDAP  09/15/2018  . OPHTHALMOLOGY EXAM  09/20/2018  . HEMOGLOBIN A1C  10/26/2018  . INFLUENZA VACCINE  10/31/2018  . FOOT EXAM  12/16/2018  . URINE MICROALBUMIN  12/17/2018  . DEXA SCAN  Completed    Cancer Screenings: Lung: Low Dose CT Chest recommended if Age 33-80 years, 30 pack-year currently smoking OR have quit w/in 15years. Patient does not qualify. Breast:  Up to date on Mammogram? No   Up to date of Bone Density/Dexa? Yes Colorectal: N/A   Additional Screenings: Hepatitis C Screening: Low risk      Plan:   - Zoster vaccine postponed until COVID-19 restrictions all over then will revisit. - Prevnair 13 vaccine postponed until COVID-19 restrictions all over then will revisit.  I have personally reviewed and noted the following in the patient's chart:   . Medical and social history . Use of alcohol, tobacco or illicit drugs  .  Current medications and supplements . Functional ability and status . Nutritional status . Physical activity . Advanced directives . List of other physicians . Hospitalizations, surgeries, and ER visits in previous 12 months . Vitals . Screenings to include cognitive, depression, and falls . Referrals and appointments  In addition, I have reviewed and discussed with patient certain preventive protocols, quality metrics, and best practice recommendations. A written personalized care plan for preventive services as well as general preventive health recommendations were provided to patient.    Sandrea Hughs, NP  07/24/2018

## 2018-07-24 NOTE — Patient Instructions (Signed)
Penny Miller , Thank you for taking time to come for your Medicare Wellness Visit. I appreciate your ongoing commitment to your health goals. Please review the following plan we discussed and let me know if I can assist you in the future.   Screening recommendations/referrals: Colonoscopy: N/A  Mammogram N/A  Bone Density up to date  Recommended yearly ophthalmology/optometry visit for glaucoma screening and checkup Recommended yearly dental visit for hygiene and checkup  Vaccinations: Influenza vaccine: Annually  Pneumococcal vaccine: Will need a Prevnair 13 once COVID-19 restrictions are over.  Tdap vaccine: Up to dated  Shingles vaccine: Due but would like to postpone  Until COVID- 19 restrictions are over.     Advanced directives: None   Conditions/risks identified: hx smoking,Advance age > 23 years old,Type 2 DM,Hyperlidemia,Hypertension   Next appointment: 1 year   Preventive Care 4 Years and Older, Female Preventive care refers to lifestyle choices and visits with your health care provider that can promote health and wellness. What does preventive care include?  A yearly physical exam. This is also called an annual well check.  Dental exams once or twice a year.  Routine eye exams. Ask your health care provider how often you should have your eyes checked.  Personal lifestyle choices, including:  Daily care of your teeth and gums.  Regular physical activity.  Eating a healthy diet.  Avoiding tobacco and drug use.  Limiting alcohol use.  Practicing safe sex.  Taking low-dose aspirin every day.  Taking vitamin and mineral supplements as recommended by your health care provider. What happens during an annual well check? The services and screenings done by your health care provider during your annual well check will depend on your age, overall health, lifestyle risk factors, and family history of disease. Counseling  Your health care provider may ask you questions  about your:  Alcohol use.  Tobacco use.  Drug use.  Emotional well-being.  Home and relationship well-being.  Sexual activity.  Eating habits.  History of falls.  Memory and ability to understand (cognition).  Work and work Statistician.  Reproductive health. Screening  You may have the following tests or measurements:  Height, weight, and BMI.  Blood pressure.  Lipid and cholesterol levels. These may be checked every 5 years, or more frequently if you are over 4 years old.  Skin check.  Lung cancer screening. You may have this screening every year starting at age 38 if you have a 30-pack-year history of smoking and currently smoke or have quit within the past 15 years.  Fecal occult blood test (FOBT) of the stool. You may have this test every year starting at age 8.  Flexible sigmoidoscopy or colonoscopy. You may have a sigmoidoscopy every 5 years or a colonoscopy every 10 years starting at age 50.  Hepatitis C blood test.  Hepatitis B blood test.  Sexually transmitted disease (STD) testing.  Diabetes screening. This is done by checking your blood sugar (glucose) after you have not eaten for a while (fasting). You may have this done every 1-3 years.  Bone density scan. This is done to screen for osteoporosis. You may have this done starting at age 52.  Mammogram. This may be done every 1-2 years. Talk to your health care provider about how often you should have regular mammograms. Talk with your health care provider about your test results, treatment options, and if necessary, the need for more tests. Vaccines  Your health care provider may recommend certain vaccines, such as:  Influenza vaccine. This is recommended every year.  Tetanus, diphtheria, and acellular pertussis (Tdap, Td) vaccine. You may need a Td booster every 10 years.  Zoster vaccine. You may need this after age 19.  Pneumococcal 13-valent conjugate (PCV13) vaccine. One dose is  recommended after age 32.  Pneumococcal polysaccharide (PPSV23) vaccine. One dose is recommended after age 51. Talk to your health care provider about which screenings and vaccines you need and how often you need them. This information is not intended to replace advice given to you by your health care provider. Make sure you discuss any questions you have with your health care provider. Document Released: 04/14/2015 Document Revised: 12/06/2015 Document Reviewed: 01/17/2015 Elsevier Interactive Patient Education  2017 [place] Prevention in the Home Falls can cause injuries. They can happen to people of all ages. There are many things you can do to make your home safe and to help prevent falls. What can I do on the outside of my home?  Regularly fix the edges of walkways and driveways and fix any cracks.  Remove anything that might make you trip as you walk through a door, such as a raised step or threshold.  Trim any bushes or trees on the path to your home.  Use bright outdoor lighting.  Clear any walking paths of anything that might make someone trip, such as rocks or tools.  Regularly check to see if handrails are loose or broken. Make sure that both sides of any steps have handrails.  Any raised decks and porches should have guardrails on the edges.  Have any leaves, snow, or ice cleared regularly.  Use sand or salt on walking paths during winter.  Clean up any spills in your garage right away. This includes oil or grease spills. What can I do in the bathroom?  Use night lights.  Install grab bars by the toilet and in the tub and shower. Do not use towel bars as grab bars.  Use non-skid mats or decals in the tub or shower.  If you need to sit down in the shower, use a plastic, non-slip stool.  Keep the floor dry. Clean up any water that spills on the floor as soon as it happens.  Remove soap buildup in the tub or shower regularly.  Attach bath mats  securely with double-sided non-slip rug tape.  Do not have throw rugs and other things on the floor that can make you trip. What can I do in the bedroom?  Use night lights.  Make sure that you have a light by your bed that is easy to reach.  Do not use any sheets or blankets that are too big for your bed. They should not hang down onto the floor.  Have a firm chair that has side arms. You can use this for support while you get dressed.  Do not have throw rugs and other things on the floor that can make you trip. What can I do in the kitchen?  Clean up any spills right away.  Avoid walking on wet floors.  Keep items that you use a lot in easy-to-reach places.  If you need to reach something above you, use a strong step stool that has a grab bar.  Keep electrical cords out of the way.  Do not use floor polish or wax that makes floors slippery. If you must use wax, use non-skid floor wax.  Do not have throw rugs and other things on the floor that  can make you trip. What can I do with my stairs?  Do not leave any items on the stairs.  Make sure that there are handrails on both sides of the stairs and use them. Fix handrails that are broken or loose. Make sure that handrails are as long as the stairways.  Check any carpeting to make sure that it is firmly attached to the stairs. Fix any carpet that is loose or worn.  Avoid having throw rugs at the top or bottom of the stairs. If you do have throw rugs, attach them to the floor with carpet tape.  Make sure that you have a light switch at the top of the stairs and the bottom of the stairs. If you do not have them, ask someone to add them for you. What else can I do to help prevent falls?  Wear shoes that:  Do not have high heels.  Have rubber bottoms.  Are comfortable and fit you well.  Are closed at the toe. Do not wear sandals.  If you use a stepladder:  Make sure that it is fully opened. Do not climb a closed  stepladder.  Make sure that both sides of the stepladder are locked into place.  Ask someone to hold it for you, if possible.  Clearly mark and make sure that you can see:  Any grab bars or handrails.  First and last steps.  Where the edge of each step is.  Use tools that help you move around (mobility aids) if they are needed. These include:  Canes.  Walkers.  Scooters.  Crutches.  Turn on the lights when you go into a dark area. Replace any light bulbs as soon as they burn out.  Set up your furniture so you have a clear path. Avoid moving your furniture around.  If any of your floors are uneven, fix them.  If there are any pets around you, be aware of where they are.  Review your medicines with your doctor. Some medicines can make you feel dizzy. This can increase your chance of falling. Ask your doctor what other things that you can do to help prevent falls. This information is not intended to replace advice given to you by your health care provider. Make sure you discuss any questions you have with your health care provider. Document Released: 01/12/2009 Document Revised: 08/24/2015 Document Reviewed: 04/22/2014 Elsevier Interactive Patient Education  2017 [employer].

## 2018-07-24 NOTE — Progress Notes (Signed)
   This service is provided via telemedicine  No vital signs collected/recorded due to the encounter was a telemedicine visit.   Location of patient (ex: home, work):  Home  Patient consents to a telephone visit:  Yes  Location of the provider (ex: office, home):  Office  Name of any referring provider:  Dr. Hollace Kinnier  Names of all persons participating in the telemedicine service and their role in the encounter:  Ruthell Rummage CMA, Dinah Ngetich NP, Joanne Gavel   Time spent on call:  Ruthell Rummage CMA spent 17   Minutes on phone with patient.

## 2018-07-31 ENCOUNTER — Other Ambulatory Visit: Payer: Self-pay

## 2018-07-31 ENCOUNTER — Encounter: Payer: Self-pay | Admitting: Podiatry

## 2018-07-31 ENCOUNTER — Ambulatory Visit (INDEPENDENT_AMBULATORY_CARE_PROVIDER_SITE_OTHER): Payer: Medicare Other | Admitting: Podiatry

## 2018-07-31 ENCOUNTER — Ambulatory Visit: Payer: Medicare Other | Admitting: Podiatry

## 2018-07-31 VITALS — Temp 96.1°F

## 2018-07-31 DIAGNOSIS — B351 Tinea unguium: Secondary | ICD-10-CM

## 2018-07-31 DIAGNOSIS — E1142 Type 2 diabetes mellitus with diabetic polyneuropathy: Secondary | ICD-10-CM

## 2018-07-31 DIAGNOSIS — M79675 Pain in left toe(s): Secondary | ICD-10-CM

## 2018-07-31 DIAGNOSIS — M79674 Pain in right toe(s): Secondary | ICD-10-CM

## 2018-07-31 NOTE — Patient Instructions (Signed)
Diabetes Mellitus and Foot Care Foot care is an important part of your health, especially when you have diabetes. Diabetes may cause you to have problems because of poor blood flow (circulation) to your feet and legs, which can cause your skin to:  Become thinner and drier.  Break more easily.  Heal more slowly.  Peel and crack. You may also have nerve damage (neuropathy) in your legs and feet, causing decreased feeling in them. This means that you may not notice minor injuries to your feet that could lead to more serious problems. Noticing and addressing any potential problems early is the best way to prevent future foot problems. How to care for your feet Foot hygiene  Wash your feet daily with warm water and mild soap. Do not use hot water. Then, pat your feet and the areas between your toes until they are completely dry. Do not soak your feet as this can dry your skin.  Trim your toenails straight across. Do not dig under them or around the cuticle. File the edges of your nails with an emery board or nail file.  Apply a moisturizing lotion or petroleum jelly to the skin on your feet and to dry, brittle toenails. Use lotion that does not contain alcohol and is unscented. Do not apply lotion between your toes. Shoes and socks  Wear clean socks or stockings every day. Make sure they are not too tight. Do not wear knee-high stockings since they may decrease blood flow to your legs.  Wear shoes that fit properly and have enough cushioning. Always look in your shoes before you put them on to be sure there are no objects inside.  To break in new shoes, wear them for just a few hours a day. This prevents injuries on your feet. Wounds, scrapes, corns, and calluses  Check your feet daily for blisters, cuts, bruises, sores, and redness. If you cannot see the bottom of your feet, use a mirror or ask someone for help.  Do not cut corns or calluses or try to remove them with medicine.  If you  find a minor scrape, cut, or break in the skin on your feet, keep it and the skin around it clean and dry. You may clean these areas with mild soap and water. Do not clean the area with peroxide, alcohol, or iodine.  If you have a wound, scrape, corn, or callus on your foot, look at it several times a day to make sure it is healing and not infected. Check for: ? Redness, swelling, or pain. ? Fluid or blood. ? Warmth. ? Pus or a bad smell. General instructions  Do not cross your legs. This may decrease blood flow to your feet.  Do not use heating pads or hot water bottles on your feet. They may burn your skin. If you have lost feeling in your feet or legs, you may not know this is happening until it is too late.  Protect your feet from hot and cold by wearing shoes, such as at the beach or on hot pavement.  Schedule a complete foot exam at least once a year (annually) or more often if you have foot problems. If you have foot problems, report any cuts, sores, or bruises to your health care provider immediately. Contact a health care provider if:  You have a medical condition that increases your risk of infection and you have any cuts, sores, or bruises on your feet.  You have an injury that is not   healing.  You have redness on your legs or feet.  You feel burning or tingling in your legs or feet.  You have pain or cramps in your legs and feet.  Your legs or feet are numb.  Your feet always feel cold.  You have pain around a toenail. Get help right away if:  You have a wound, scrape, corn, or callus on your foot and: ? You have pain, swelling, or redness that gets worse. ? You have fluid or blood coming from the wound, scrape, corn, or callus. ? Your wound, scrape, corn, or callus feels warm to the touch. ? You have pus or a bad smell coming from the wound, scrape, corn, or callus. ? You have a fever. ? You have a red line going up your leg. Summary  Check your feet every day  for cuts, sores, red spots, swelling, and blisters.  Moisturize feet and legs daily.  Wear shoes that fit properly and have enough cushioning.  If you have foot problems, report any cuts, sores, or bruises to your health care provider immediately.  Schedule a complete foot exam at least once a year (annually) or more often if you have foot problems. This information is not intended to replace advice given to you by your health care provider. Make sure you discuss any questions you have with your health care provider. Document Released: 03/15/2000 Document Revised: 04/30/2017 Document Reviewed: 04/19/2016 Elsevier Interactive Patient Education  2019 [place] are small areas of thickened skin that occur on the top, sides, or tip of a toe. They contain a cone-shaped core with a point that can press on a nerve below. This causes pain.  Calluses are areas of thickened skin that can occur anywhere on the body, including the hands, fingers, palms, soles of the feet, and heels. Calluses are usually larger than corns. What are the causes? Corns and calluses are caused by rubbing (friction) or pressure, such as from shoes that are too tight or do not fit properly. What increases the risk? Corns are more likely to develop in people who have misshapen toes (toe deformities), such as hammer toes. Calluses can occur with friction to any area of the skin. They are more likely to develop in people who:  Work with their hands.  Wear shoes that fit poorly, are too tight, or are high-heeled.  Have toe deformities. What are the signs or symptoms? Symptoms of a corn or callus include:  A hard growth on the skin.  Pain or tenderness under the skin.  Redness and swelling.  Increased discomfort while wearing tight-fitting shoes, if your feet are affected. If a corn or callus becomes infected, symptoms may include:  Redness and swelling that gets worse.  Pain.   Fluid, blood, or pus draining from the corn or callus. How is this diagnosed? Corns and calluses may be diagnosed based on your symptoms, your medical history, and a physical exam. How is this treated? Treatment for corns and calluses may include:  Removing the cause of the friction or pressure. This may involve: ? Changing your shoes. ? Wearing shoe inserts (orthotics) or other protective layers in your shoes, such as a corn pad. ? Wearing gloves.  Applying medicine to the skin (topical medicine) to help soften skin in the hardened, thickened areas.  Removing layers of dead skin with a file to reduce the size of the corn or callus.  Removing the corn or callus with a  scalpel or laser.  Taking antibiotic medicines, if your corn or callus is infected.  Having surgery, if a toe deformity is the cause. Follow these instructions at home:   Take over-the-counter and prescription medicines only as told by your health care provider.  If you were prescribed an antibiotic, take it as told by your health care provider. Do not stop taking it even if your condition starts to improve.  Wear shoes that fit well. Avoid wearing high-heeled shoes and shoes that are too tight or too loose.  Wear any padding, protective layers, gloves, or orthotics as told by your health care provider.  Soak your hands or feet and then use a file or pumice stone to soften your corn or callus. Do this as told by your health care provider.  Check your corn or callus every day for symptoms of infection. Contact a health care provider if you:  Notice that your symptoms do not improve with treatment.  Have redness or swelling that gets worse.  Notice that your corn or callus becomes painful.  Have fluid, blood, or pus coming from your corn or callus.  Have new symptoms. Summary  Corns are small areas of thickened skin that occur on the top, sides, or tip of a toe.  Calluses are areas of thickened skin that  can occur anywhere on the body, including the hands, fingers, palms, and soles of the feet. Calluses are usually larger than corns.  Corns and calluses are caused by rubbing (friction) or pressure, such as from shoes that are too tight or do not fit properly.  Treatment may include wearing any padding, protective layers, gloves, or orthotics as told by your health care provider. This information is not intended to replace advice given to you by your health care provider. Make sure you discuss any questions you have with your health care provider. Document Released: 12/23/2003 Document Revised: 01/29/2017 Document Reviewed: 01/29/2017 Elsevier Interactive Patient Education  2019 [employer].

## 2018-08-03 ENCOUNTER — Other Ambulatory Visit: Payer: Self-pay

## 2018-08-03 ENCOUNTER — Ambulatory Visit (INDEPENDENT_AMBULATORY_CARE_PROVIDER_SITE_OTHER): Payer: Medicare Other | Admitting: *Deleted

## 2018-08-03 DIAGNOSIS — I639 Cerebral infarction, unspecified: Secondary | ICD-10-CM

## 2018-08-04 LAB — CUP PACEART REMOTE DEVICE CHECK
Date Time Interrogation Session: 20200504161100
Implantable Pulse Generator Implant Date: 20180502

## 2018-08-05 ENCOUNTER — Ambulatory Visit: Payer: Medicare Other | Admitting: Diagnostic Neuroimaging

## 2018-08-06 ENCOUNTER — Encounter: Payer: Self-pay | Admitting: Podiatry

## 2018-08-06 NOTE — Progress Notes (Signed)
Subjective: Penny Miller presents today with history of diabetic neuropathy with cc of painful, mycotic toenails.  Pain is aggravated when wearing enclosed shoe gear and relieved with periodic professional debridement.  Patient has symptoms of diabetic neuropathy managed with gabapentin.  Reed, [name] L, DO is her PCP. Last visit was 05/04/2018.   Current Outpatient Medications:  .  albuterol (PROVENTIL HFA;VENTOLIN HFA) 108 (90 Base) MCG/ACT inhaler, Inhale 2 puffs into the lungs every 6 (six) hours as needed for wheezing or shortness of breath., Disp: 1 Inhaler, Rfl: 0 .  amLODipine (NORVASC) 2.5 MG tablet, Take 1 tablet (2.5 mg total) by mouth daily., Disp: 180 tablet, Rfl: 1 .  atenolol (TENORMIN) 25 MG tablet, Take 0.5 tablets (12.5 mg total) by mouth daily., Disp: 45 tablet, Rfl: 1 .  atorvastatin (LIPITOR) 40 MG tablet, Take 1 tablet (40 mg total) by mouth daily at 6 PM., Disp: 90 tablet, Rfl: 1 .  citalopram (CELEXA) 10 MG tablet, Take 1 tablet (10 mg total) by mouth daily., Disp: 90 tablet, Rfl: 1 .  ELIQUIS 5 MG TABS tablet, TAKE 1 TABLET TWICE A DAY, Disp: 180 tablet, Rfl: 2 .  gabapentin (NEURONTIN) 300 MG capsule, Take 1 capsule (300 mg total) by mouth 3 (three) times daily., Disp: 270 capsule, Rfl: 3 .  glucose blood (ACCU-CHEK AVIVA PLUS) test strip, Use to test blood sugar twice daily. Dx: E11.29, Disp: 300 each, Rfl: 3 .  Lancets (ACCU-CHEK MULTICLIX) lancets, Use to test blood sugar twice daily. E11.29, Disp: 100 each, Rfl: 11 .  pioglitazone (ACTOS) 15 MG tablet, Take 1 tablet (15 mg total) by mouth daily., Disp: 90 tablet, Rfl: 3 .  Vitamin D, Ergocalciferol, (DRISDOL) 1.25 MG (50000 UT) CAPS capsule, TAKE 1 CAPSULE ONCE WEEKLY FOR VITAMIN D, Disp: 12 capsule, Rfl: 3  Allergies  Allergen Reactions  . Jardiance [Empagliflozin] Diarrhea  . Metformin And Related Diarrhea  . Ceclor [Cefaclor] Other (See Comments)    Unknown reaction; not recalled  . Codeine Nausea And  Vomiting  . Lopid [Gemfibrozil] Other (See Comments)    Unknown reaction; not recalled  . Metoprolol Nausea And Vomiting  . Morphine And Related Other (See Comments)    Passed out  . Niacin Nausea And Vomiting    Unknown reaction Unknown reaction; not recalled  . Niacin And Related Other (See Comments)    Unknown reaction; not recalled  . Other   . Relafen [Nabumetone] Nausea And Vomiting  . Septra [Sulfamethoxazole-Trimethoprim] Nausea And Vomiting  . Tetracycline Nausea And Vomiting  . Toprol Xl [Metoprolol Tartrate] Nausea And Vomiting  . Adhesive [Tape] Rash and Other (See Comments)    Tears the skin also    Objective: Vitals:   07/31/18 0947  Temp: (!) 96.1 F (35.6 C)   Vascular Examination: Capillary refill time <4 seconds x 10 digits.  Dorsalis pedis and Posterior tibial pulses 2/4 b/l.  Digital hair x 10 digits was absent.  Skin temperature gradient WNL b/l.  Dermatological Examination: Skin is thin and atrophic b/l.  Toenails 1-5 b/l discolored, thick, dystrophic with subungual debris and pain with palpation to nailbeds due to thickness of nails.  Incurvated nailplate b/l great toes with tenderness to palpation. No erythema, no edema, no drainage noted.  No open wounds.  No interdigital macerations.  Musculoskeletal: Muscle strength 5/5 to all muscle groups b/l.  No pain, effusions or crepitus with joint ROM.  Neurological: Sensation with 10 gram monofilament is intact b/l.  Assessment: 1. Painful onychomycosis  toenails 1-5 b/l 2. NIDDM with neuropathy  Plan: 1. Toenails 1-5 b/l were debrided in length and girth without iatrogenic bleeding. Offending nail borders debrided and curretaged b/l great toes. Borders cleansed with alcohol. Antibiotic ointment applied. No further treatment required by patient. 2. Patient to continue soft, supportive shoe gear 3. Patient to report any pedal injuries to medical professional  4. Follow up 3 months.   5. Patient/POA to call should there be a concern in the interim.

## 2018-08-10 NOTE — Progress Notes (Signed)
Carelink Summary Report / Loop Recorder 

## 2018-09-06 LAB — CUP PACEART REMOTE DEVICE CHECK
Date Time Interrogation Session: 20200606163959
Implantable Pulse Generator Implant Date: 20180502

## 2018-09-07 ENCOUNTER — Ambulatory Visit (INDEPENDENT_AMBULATORY_CARE_PROVIDER_SITE_OTHER): Payer: Medicare Other | Admitting: *Deleted

## 2018-09-07 DIAGNOSIS — I639 Cerebral infarction, unspecified: Secondary | ICD-10-CM

## 2018-09-07 DIAGNOSIS — I480 Paroxysmal atrial fibrillation: Secondary | ICD-10-CM

## 2018-09-11 ENCOUNTER — Other Ambulatory Visit: Payer: Self-pay | Admitting: Internal Medicine

## 2018-09-14 NOTE — Progress Notes (Signed)
Carelink Summary Report / Loop Recorder 

## 2018-09-21 ENCOUNTER — Telehealth: Payer: Self-pay

## 2018-09-21 NOTE — Telephone Encounter (Signed)
I spoke with the pt because I needed her to send a manual transmission with her home monitor. She tried but received the error code 3230. I told her to let the handle charge for 1 hour and I will call her back to try again.

## 2018-09-21 NOTE — Telephone Encounter (Signed)
Called pt back and she still received error code 3230. I gave her the number to [facility] support to get additional help.

## 2018-09-22 NOTE — Telephone Encounter (Signed)
Spoke with pt and she called [employer] tech support. They told her to charge the handle for an hour and try again. She did. Pt received the error code 3230 again. She is going to call [employer] tech support again. I told her I will call her in the morning to see what they say.

## 2018-09-22 NOTE — Telephone Encounter (Signed)
Pt states she has not called tech support. She agreed to give [facility] tech support a call today.

## 2018-09-23 NOTE — Telephone Encounter (Signed)
Pt called [employer] tech support and they are sending her a new monitor in 5-10 business days.

## 2018-09-29 ENCOUNTER — Other Ambulatory Visit (HOSPITAL_COMMUNITY): Payer: Self-pay | Admitting: Nurse Practitioner

## 2018-09-29 ENCOUNTER — Other Ambulatory Visit: Payer: Self-pay | Admitting: Internal Medicine

## 2018-09-29 DIAGNOSIS — E1129 Type 2 diabetes mellitus with other diabetic kidney complication: Secondary | ICD-10-CM

## 2018-09-30 NOTE — Telephone Encounter (Signed)
Transmission received 09-27-2018

## 2018-10-01 NOTE — Telephone Encounter (Signed)
Transmission reviewed. Pt w/ known PAF + eliquis & nocturnal pauses.

## 2018-10-03 ENCOUNTER — Other Ambulatory Visit: Payer: Self-pay | Admitting: Internal Medicine

## 2018-10-07 ENCOUNTER — Ambulatory Visit: Payer: Medicare Other | Admitting: Podiatry

## 2018-10-08 ENCOUNTER — Ambulatory Visit (INDEPENDENT_AMBULATORY_CARE_PROVIDER_SITE_OTHER): Payer: Medicare Other | Admitting: *Deleted

## 2018-10-08 DIAGNOSIS — I480 Paroxysmal atrial fibrillation: Secondary | ICD-10-CM | POA: Diagnosis not present

## 2018-10-08 LAB — CUP PACEART REMOTE DEVICE CHECK
Date Time Interrogation Session: 20200709173854
Implantable Pulse Generator Implant Date: 20180502

## 2018-10-09 ENCOUNTER — Other Ambulatory Visit: Payer: Self-pay

## 2018-10-09 MED ORDER — ALBUTEROL SULFATE HFA 108 (90 BASE) MCG/ACT IN AERS: 2.0000 | INHALATION_SPRAY | Freq: Four times a day (QID) | RESPIRATORY_TRACT | 0 refills | Status: DC | PRN

## 2018-10-14 NOTE — Progress Notes (Signed)
Carelink Summary Report / Loop Recorder 

## 2018-11-02 ENCOUNTER — Ambulatory Visit
Admission: RE | Admit: 2018-11-02 | Discharge: 2018-11-02 | Disposition: A | Discharge status: Home / Self Care | Payer: Medicare Other | Source: Ambulatory Visit | Attending: Nurse Practitioner | Admitting: Nurse Practitioner

## 2018-11-02 ENCOUNTER — Ambulatory Visit (INDEPENDENT_AMBULATORY_CARE_PROVIDER_SITE_OTHER): Payer: Medicare Other | Admitting: Nurse Practitioner

## 2018-11-02 ENCOUNTER — Other Ambulatory Visit: Payer: Self-pay

## 2018-11-02 ENCOUNTER — Encounter: Payer: Self-pay | Admitting: Nurse Practitioner

## 2018-11-02 VITALS — BP 132/66 | HR 52 | Temp 97.7°F | Ht 66.0 in | Wt 193.4 lb

## 2018-11-02 DIAGNOSIS — E1129 Type 2 diabetes mellitus with other diabetic kidney complication: Secondary | ICD-10-CM

## 2018-11-02 DIAGNOSIS — I480 Paroxysmal atrial fibrillation: Secondary | ICD-10-CM | POA: Diagnosis not present

## 2018-11-02 DIAGNOSIS — I639 Cerebral infarction, unspecified: Secondary | ICD-10-CM | POA: Diagnosis not present

## 2018-11-02 DIAGNOSIS — R601 Generalized edema: Secondary | ICD-10-CM

## 2018-11-02 DIAGNOSIS — R0602 Shortness of breath: Secondary | ICD-10-CM

## 2018-11-02 DIAGNOSIS — R001 Bradycardia, unspecified: Secondary | ICD-10-CM | POA: Diagnosis not present

## 2018-11-02 DIAGNOSIS — D649 Anemia, unspecified: Secondary | ICD-10-CM | POA: Diagnosis not present

## 2018-11-02 MED ORDER — POTASSIUM CHLORIDE ER 20 MEQ PO TBCR: 10.0000 meq | EXTENDED_RELEASE_TABLET | Freq: Every day | ORAL | 1 refills | Status: DC

## 2018-11-02 MED ORDER — FUROSEMIDE 40 MG PO TABS: 40.0000 mg | ORAL_TABLET | Freq: Every day | ORAL | 3 refills | Status: DC

## 2018-11-02 NOTE — Progress Notes (Signed)
Careteam: Patient Care Team: [name], DO as PCP - General (Geriatric Medicine) Ulice Bold, MD as Referring Physician (Dermatology) Love, Alyson Locket, MD as Consulting Physician (Neurology) Sharyne Peach, MD as Consulting Physician (Ophthalmology) Gean Birchwood, DPM as Consulting Physician (Podiatry) Ronald Lobo, MD as Consulting Physician (Gastroenterology) Lomax, Marny Lowenstein, MD (Inactive) as Consulting Physician (Gynecology) Druscilla Brownie, MD as Consulting Physician (Dermatology) Clent Jacks, MD as Consulting Physician (Ophthalmology) Myrlene Broker, MD as Attending Physician (Urology) Minus Breeding, MD as Consulting Physician (Cardiology)  Advanced Directive information Does Patient Have a Medical Advance Directive?: No, Would patient like information on creating a medical advance directive?: No - Patient declined, Does patient want to make changes to medical advance directive?: No - Patient declined  Allergies  Allergen Reactions  . Jardiance [Empagliflozin] Diarrhea  . Metformin And Related Diarrhea  . Ceclor [Cefaclor] Other (See Comments)    Unknown reaction; not recalled  . Codeine Nausea And Vomiting  . Lopid [Gemfibrozil] Other (See Comments)    Unknown reaction; not recalled  . Metoprolol Nausea And Vomiting  . Morphine And Related Other (See Comments)    Passed out  . Niacin Nausea And Vomiting    Unknown reaction Unknown reaction; not recalled  . Niacin And Related Other (See Comments)    Unknown reaction; not recalled  . Other   . Relafen [Nabumetone] Nausea And Vomiting  . Septra [Sulfamethoxazole-Trimethoprim] Nausea And Vomiting  . Tetracycline Nausea And Vomiting  . Toprol Xl [Metoprolol Tartrate] Nausea And Vomiting  . Adhesive [Tape] Rash and Other (See Comments)    Tears the skin also    Chief Complaint  Patient presents with  . Acute Visit    weight gain yesterday weight was 186lb today 193.4lb, trouble walking,  fatigue on and off      HPI: Patient is a 85 y.o. female seen in the office today due to weight gain.   She weights herself daily because the physical therapist told her to weight daily in the morning due to her swelling in her legs. Swelling in her legs started last year.   Weighted herself yesterday and it was 186 lb yesterday and now 195 lb today prior to that she noted that she has been holding onto so fluid.  She has gradually been gaining weight.   Reports shortness of breath and increase weakness which has been ongoing since November. Worse in the last week. Can not do anything.   Appetite is not what it used to be. Tries to eat foods that she needs.  Does not add salt.   Has loop recorder due to her a fib. States that she will get notified that her HR gets really low at night and a few bouts of a fib during the week. Has only passed out once.  Occasionally will feel funny and feel weak and go check her O2 and it was 96-97 but HR would be in the 50s. She would feel weak.  More recently O2 has been lower 91-92%  Sleeping in bed flat but sleeps on her side.   Granddaughter also states she looks much paler than she generally dose.   Following with Dr Lovena Le due to her loop recorder.   Review of Systems:  Review of Systems  Constitutional: Positive for malaise/fatigue. Negative for chills and fever.  Respiratory: Positive for shortness of breath and wheezing. Negative for cough and sputum production.   Cardiovascular: Positive for leg swelling. Negative for chest pain  and palpitations.  Gastrointestinal: Negative for abdominal pain, diarrhea and heartburn.  Neurological: Positive for weakness.    Past Medical History:  Diagnosis Date  . Allergic rhinitis, cause unspecified   . Anxiety state, unspecified   . Diaphragmatic hernia without mention of obstruction or gangrene   . Diverticulosis of colon (without mention of hemorrhage)   . Female stress incontinence   .  Insomnia, unspecified   . Interstitial cystitis    surgery 02/01/2013  . Irritable bowel syndrome   . Memory loss   . Other and unspecified hyperlipidemia   . Other premature beats   . Polyneuropathy in diabetes(357.2)   . Postmenopausal bleeding   . Proteinuria   . Stroke ([place])   . 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 Unspecified essential hypertension   . Viral warts, unspecified    Past Surgical History:  Procedure Laterality Date  . ABDOMINAL HYSTERECTOMY  1977   Dr.Hambright  . APPENDECTOMY  1965  . CATARACT EXTRACTION, BILATERAL  02/10/2006   Dr.Groat   . COLONOSCOPY  07/17/2004   polypectomy  . [place]  . LOOP RECORDER INSERTION N/A 07/31/2016   Procedure: Loop Recorder Insertion;  Surgeon: [name], MD;  Location: [facility];  Service: Cardiovascular;  Laterality: N/A;  . TEE WITHOUT CARDIOVERSION N/A 07/30/2016   Procedure: TRANSESOPHAGEAL ECHOCARDIOGRAM (TEE);  Surgeon: [name], MD;  Location: [facility];  Service: Cardiovascular;  Laterality: N/A;  . [place]  . TRIGGER FINGER RELEASE  05/13/16  . urinary bladder      surgery 02/01/2013 to stretch bladder stem   Social History:   reports that she quit smoking about 7 years ago. She has a 15.50 pack-year smoking history. She has never used smokeless tobacco. She reports that she does not drink alcohol or use drugs.  Family History  Problem Relation Age of Onset  . Cancer Mother        pancreatic  . Cancer Father        bladder  . Heart attack Father   . Hypertension Sister   . Diabetes Sister   . Diabetes Brother   . Kidney cancer Brother   . Hypertension Brother   . Diabetes Brother   . Heart disease Brother 32       Myocardial Infarction     Medications: Patient's Medications  New Prescriptions   No medications on file  Previous Medications    ACCU-CHEK AVIVA PLUS TEST STRIP    USE TO TEST BLOOD SUGAR TWICE DAILY.   ALBUTEROL (VENTOLIN HFA) 108 (90 BASE) MCG/ACT INHALER    Inhale 2 puffs into the lungs every 6 (six) hours as needed for wheezing or shortness of breath.   AMLODIPINE (NORVASC) 2.5 MG TABLET    Take 1 tablet (2.5 mg total) by mouth daily.   ATENOLOL (TENORMIN) 25 MG TABLET    TAKE ONE-HALF (1/2) TABLET DAILY   ATORVASTATIN (LIPITOR) 40 MG TABLET    TAKE 1 TABLET DAILY AT 6 P.M.   CITALOPRAM (CELEXA) 10 MG TABLET    Take 1 tablet (10 mg total) by mouth daily.   ELIQUIS 5 MG TABS TABLET    TAKE 1 TABLET TWICE A DAY   GABAPENTIN (NEURONTIN) 300 MG CAPSULE    Take 1 capsule (300 mg total) by mouth 3 (three) times daily.   LANCETS (ACCU-CHEK MULTICLIX) LANCETS  Use to test blood sugar twice daily. E11.29   PIOGLITAZONE (ACTOS) 15 MG TABLET    TAKE 1 TABLET DAILY   VITAMIN D, ERGOCALCIFEROL, (DRISDOL) 1.25 MG (50000 UT) CAPS CAPSULE    TAKE 1 CAPSULE ONCE WEEKLY FOR VITAMIN D  Modified Medications   No medications on file  Discontinued Medications   No medications on file    Physical Exam:  Vitals:   11/02/18 1306  BP: 132/66  Pulse: (!) 52  Temp: 97.7 F (36.5 C)  TempSrc: Oral  SpO2: 93%  Weight: 193 lb 6.4 oz (87.7 kg)  Height: 5' 6" (1.676 m)   Body mass index is 31.22 kg/m. Wt Readings from Last 3 Encounters:  11/02/18 193 lb 6.4 oz (87.7 kg)  05/27/18 181 lb 3.2 oz (82.2 kg)  05/04/18 182 lb (82.6 kg)    Physical Exam Constitutional:      General: She is not in acute distress.    Appearance: She is well-developed.  Neck:     Musculoskeletal: Normal range of motion and neck supple.  Cardiovascular:     Rate and Rhythm: Bradycardia present. Rhythm irregular.     Comments: irreg irreg, brady Pulmonary:     Effort: Pulmonary effort is normal. No respiratory distress.     Breath sounds: Wheezing (throughout) present. No rales.     Comments: Increased RR Abdominal:     General: Bowel sounds are  normal. There is no distension.     Palpations: There is no mass.     Tenderness: There is no abdominal tenderness.  Musculoskeletal: Normal range of motion.     Right lower leg: Edema (1+) present.     Left lower leg: Edema (1+) present.  Skin:    General: Skin is warm and dry.     Coloration: Skin is pale.  Neurological:     Mental Status: She is alert and oriented to person, place, and time.  Psychiatric:        Mood and Affect: Mood normal.     Labs reviewed: Basic Metabolic Panel: Recent Labs    02/02/18 0534  02/04/18 0553 02/05/18 0612 02/13/18 1122 04/27/18 0931  NA 137   < > 138 138 138 137  K 3.9   < > 4.1 3.8 3.8 4.8  CL 105   < > 109 108 98 104  CO2 25   < > 23 24 33* 27  GLUCOSE 173*   < > 162* 161* 91 132*  BUN 50*   < > 44* 38* 27* 19  CREATININE 1.89*   < > 1.56* 1.39* 1.51* 1.30*  CALCIUM 8.7*   < > 8.2* 8.4* 9.0 9.6  MG 1.9  --  1.9  --   --   --    < > = values in this interval not displayed.   Liver Function Tests: Recent Labs    01/31/18 1347 02/01/18 0613 02/13/18 1122  AST _0 ALT _1 ALKPHOS 100 74  --   BILITOT 0.7 0.5 0.7  PROT 7.7 6.3* 5.9*  ALBUMIN 4.1 3.2*  --    No results for input(s): LIPASE, AMYLASE in the last 8760 hours. No results for input(s): AMMONIA in the last 8760 hours. CBC: Recent Labs    02/04/18 0553 02/05/18 0612 02/13/18 1122 04/27/18 0931  WBC 12.0* 11.8* 11.9* 8.2  NEUTROABS 8.9*  --  9,353* 5,519  HGB 10.4* 10.3* 12.5 11.6*  HCT 33.8* 34.1* 38.4 35.4  MCV  96.8 97.7 92.8 92.7  PLT 146* 157 153 189   Lipid Panel: No results for input(s): CHOL, HDL, LDLCALC, TRIG, CHOLHDL, LDLDIRECT in the last 8760 hours. TSH: No results for input(s): TSH in the last 8760 hours. A1C: Lab Results  Component Value Date   HGBA1C 6.9 (H) 04/27/2018     Assessment/Plan 1. Paroxysmal atrial fibrillation (HCC) With bradycardia, continues on eliquis for anticoagulation.   2. Bradycardia -ongoing  bradycardia, loop recorder monitored by cardiology  3. Shortness of breath -progressively worsening shortess of breath and wheezing that is nonresponsive to albuterol and suspect cardiac in nature- Her weight has increased from 176 lbs in Dec 2019, 181 lbs in Feb 2020 and currently 193 lbs in office. She noted a 9 lbs weight gain over night  - CMP with eGFR([facility]) - CBC with Differential/Platelet - Brain Natriuretic Peptide - furosemide (LASIX) 40 MG tablet; Take 1 tablet (40 mg total) by mouth daily.  Dispense: 30 tablet; Refill: 3 - potassium chloride 20 MEQ TBCR; Take 10 mEq by mouth daily.  Dispense: 30 tablet; Refill: 1 -chest xray  4. 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 Generalized edema -noted to have Normal LV EF on echo in November 2019  weight has been trending upwards with significant gain over night. - CMP with eGFR([facility]) - CBC with Differential/Platelet - Brain Natriuretic Peptide - furosemide (LASIX) 40 MG tablet; Take 1 tablet (40 mg total) by mouth daily.  Dispense: 30 tablet; Refill: 3 - potassium chloride 20 MEQ TBCR; Take 10 mEq by mouth daily.  Dispense: 30 tablet; Refill: 1  Strict precautions given to pt to go to ED if shortness of breath progresses or chest pains occur. appt scheduled with cardiology tomorrow.  Carlos American. [place], [facility] [phone]

## 2018-11-02 NOTE — Patient Instructions (Signed)
To start lasix 40 mg daily (start today) with lasix 20 meq daily  To get chest xray today  To follow up with cardiologist, appt made for tomorrow

## 2018-11-02 NOTE — Progress Notes (Signed)
Cardiology Office Note:    Date:  11/03/2018   ID:  Penny Miller, DOB 05-Sep-1931, MRN [id number]  PCP:  [name], DO  Cardiologist:  Minus Breeding, MD , last seen in 2017 Electrophysiologist: Cristopher Peru, MD  Referring MD: [name], DO   Chief Complaint  Patient presents with  . Shortness of Breath    LE swelling    History of Present Illness:    Penny Miller is a 85 y.o. female with a hx of PAF, HTN, embolic stroke, aneurysm of vertebral artery, DM with polyneuropathy, and chronic diastolic heart failure. Afib was diagnosed by [name] following ILR implanted following her stroke. She was placed on eliquis for [facility] Echocardiogram 01/2018 with preserved EF of 55-60% but with severely increased PA pressure and dilated IVC, pulmonary hypertension.  She was last seen in clinic by [name] on 03/09/18 and was doing well on [facility] at that time. Carotid dopplers (2017) with mild plaque.   Office was contacted that patient had a 9 lb weight gain overnight. She was added to my schedule.    She was 186 lbs on 11/01/18 and then 195 lbs 11/02/18. She thinks she has been holding onto fluid. She was 176 lbs in Dec 2019 and and 181 lbs in Feb 2020.  She is here with her granddaughter, who is helpful with history. She saw her PCP yesterday who started 40 mg lasix and 9mEq potassium.  She has taken 2 doses and has had good urinary output. She reports LE edema, SOB, DOE, orthopnea. She is easily fatigued. Her pants do not fit, consistent with abdominal fullness. She states this fluid problem started after her hospitalization 01/2018. She is generally very active and independent on ADLs. She has completed aggressive PT following her stroke. No recent falls.  Labs by PCP 11/02/18: BNP was 350 sCr 1.40, K 5.2  She has lost 6 lbs overnight after only one dose of lasix.    Past Medical History:  Diagnosis Date  . Allergic rhinitis, cause unspecified   . Anxiety state, unspecified   .  Diaphragmatic hernia without mention of obstruction or gangrene   . Diverticulosis of colon (without mention of hemorrhage)   . Female stress incontinence   . Insomnia, unspecified   . Interstitial cystitis    surgery 02/01/2013  . Irritable bowel syndrome   . Memory loss   . Other and unspecified hyperlipidemia   . Other premature beats   . Polyneuropathy in diabetes(357.2)   . Postmenopausal bleeding   . Proteinuria   . Stroke ([place])   . 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 Unspecified essential hypertension   . Viral warts, unspecified     Past Surgical History:  Procedure Laterality Date  . ABDOMINAL HYSTERECTOMY  1977   Dr.Hambright  . APPENDECTOMY  1965  . CATARACT EXTRACTION, BILATERAL  02/10/2006   Dr.Groat   . COLONOSCOPY  07/17/2004   polypectomy  . [place]  . LOOP RECORDER INSERTION N/A 07/31/2016   Procedure: Loop Recorder Insertion;  Surgeon: [name], MD;  Location: [facility];  Service: Cardiovascular;  Laterality: N/A;  . TEE WITHOUT CARDIOVERSION N/A 07/30/2016   Procedure: TRANSESOPHAGEAL ECHOCARDIOGRAM (TEE);  Surgeon: [name], MD;  Location: [facility];  Service: Cardiovascular;  Laterality: N/A;  . [place]  . TRIGGER FINGER RELEASE  05/13/16  . urinary bladder      surgery 02/01/2013 to stretch bladder stem    Current Medications: Current Meds  Medication Sig  . ACCU-CHEK AVIVA PLUS test strip USE TO TEST BLOOD SUGAR TWICE DAILY.  Marland Kitchen albuterol (VENTOLIN HFA) 108 (90 Base) MCG/ACT inhaler Inhale 2 puffs into the lungs every 6 (six) hours as needed for wheezing or shortness of breath.  Marland Kitchen amLODipine (NORVASC) 2.5 MG tablet Take 1 tablet (2.5 mg total) by mouth daily.  Marland Kitchen atenolol (TENORMIN) 25 MG tablet TAKE ONE-HALF (1/2) TABLET DAILY  . atorvastatin (LIPITOR) 40 MG tablet TAKE 1 TABLET DAILY AT 6 P.M.   . citalopram (CELEXA) 10 MG tablet Take 1 tablet (10 mg total) by mouth daily.  Marland Kitchen ELIQUIS 5 MG TABS tablet TAKE 1 TABLET TWICE A DAY  . furosemide (LASIX) 40 MG tablet Take 1 tablet (40 mg total) by mouth daily.  Marland Kitchen gabapentin (NEURONTIN) 300 MG capsule Take 1 capsule (300 mg total) by mouth 3 (three) times daily.  . Lancets (ACCU-CHEK MULTICLIX) lancets Use to test blood sugar twice daily. E11.29  . pioglitazone (ACTOS) 15 MG tablet TAKE 1 TABLET DAILY  . potassium chloride 20 MEQ TBCR Take 10 mEq by mouth daily.  . Vitamin D, Ergocalciferol, (DRISDOL) 1.25 MG (50000 UT) CAPS capsule TAKE 1 CAPSULE ONCE WEEKLY FOR VITAMIN D     Allergies:   Jardiance [empagliflozin], Metformin and related, Ceclor [cefaclor], Codeine, Lopid [gemfibrozil], Metoprolol, Morphine and related, Niacin, Niacin and related, Other, Relafen [nabumetone], Septra [sulfamethoxazole-trimethoprim], Tetracycline, Toprol xl [metoprolol tartrate], and Adhesive [tape]   Social History   Socioeconomic History  . Marital status: Widowed    Spouse name: Not on file  . Number of children: 1  . Years of education: Not on file  . Highest education level: Not on file  Occupational History  . 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 Financial resource strain: Not hard at all  . Food insecurity    Worry: Never true    Inability: Never true  . Transportation needs    Medical: No    Non-medical: No  Tobacco Use  . Smoking status: Former Smoker    Packs/day: 0.25    Years: 62.00    Pack years: 15.50    Quit date: 04/02/2011    Years since quitting: 7.5  . Smokeless tobacco: Never Used  . Tobacco comment: stopped consistently smoking in 2012 but still smokes occasionally (one pack per week)  Substance and Sexual Activity  . Alcohol use: No  . Drug use: No  . Sexual activity: Never  Lifestyle  . Physical activity    Days per week: 7 days    Minutes per session: 10 min  . Stress: Not at all   Relationships  . Social connections    Talks on phone: More than three times a week    Gets together: More than three times a week    Attends religious service: Never    Active member of club or organization: No    Attends meetings of clubs or organizations: Never    Relationship status: Widowed  Other Topics Concern  . Not on file  Social History Narrative   Ophthalmologist-Dr.Gould and Dr.Groat   Podiatrist- Dr.Tuchman   Dermatologist- Dr.Lupton   Urologist- Dr. Lawerance Bach    Lives with her son.      Family History: The patient's family history includes Cancer in her father and mother; Diabetes  in her brother, brother, and sister; Heart attack in her father; Heart disease (age of onset: 97) in her brother; Hypertension in her brother and sister; Kidney cancer in her brother.  ROS:   Please see the history of present illness.     All other systems reviewed and are negative.  EKGs/Labs/Other Studies Reviewed:    The following studies were reviewed today:  Echo 02/05/18 Study Conclusions - Left ventricle: The cavity size was normal. There was moderate   concentric hypertrophy. Systolic function was normal. The   estimated ejection fraction was in the range of 55% to 60%. Wall   motion was normal; there were no regional wall motion   abnormalities. The study was not technically sufficient to allow   evaluation of LV diastolic dysfunction due to atrial   fibrillation. - Aortic valve: Moderately calcified annulus. Trileaflet; mildly   thickened leaflets. There was very mild stenosis. Mean gradient   (S): 6 mm Hg. - Aorta: The aorta was moderately calcified. - Mitral valve: Calcified annulus. There was mild regurgitation. - Left atrium: The atrium was mildly to moderately dilated. - Right ventricle: The cavity size was mildly dilated. Wall   thickness was normal. Systolic function was low normal. - Right atrium: The atrium was severely dilated. - Atrial septum: No defect or  patent foramen ovale was identified. - Tricuspid valve: There was mild regurgitation. - Pulmonic valve: There was trivial regurgitation. - Pulmonary arteries: Systolic pressure was severely increased. PA   peak pressure: 59 mm Hg (S). - Inferior vena cava: The vessel was dilated. The respirophasic   diameter changes were blunted (< 50%), consistent with elevated   central venous pressure.  Impressions: - Normal LV EF but severely elevated right sided filling pressures   consistent with pulmonary hypertension. No cardiac source of   embolism identified.  EKG:  EKG is not ordered today.   Recent Labs: 02/04/2018: Magnesium 1.9 11/02/2018: ALT 15; Brain Natriuretic Peptide 350; BUN 26; Creat 1.40; Hemoglobin 9.5; Platelets 226; Potassium 5.2; Sodium 138  Recent Lipid Panel    Component Value Date/Time   CHOL 135 03/17/2017 0920   CHOL 159 06/23/2015 0927   TRIG 179 (H) 03/17/2017 0920   HDL 40 (L) 03/17/2017 0920   HDL 43 06/23/2015 0927   CHOLHDL 3.4 03/17/2017 0920   VLDL 29 11/05/2016 0906   LDLCALC 69 03/17/2017 0920    Physical Exam:    VS:  BP (!) 143/63   Pulse 60   Temp (!) 95.7 F (35.4 C)   Ht 5\' 6"  (1.676 m)   Wt 190 lb 9.6 oz (86.5 kg)   BMI 30.76 kg/m     Wt Readings from Last 3 Encounters:  11/03/18 190 lb 9.6 oz (86.5 kg)  11/02/18 193 lb 6.4 oz (87.7 kg)  05/27/18 181 lb 3.2 oz (82.2 kg)     GEN: elderly female, in no acute distress HEENT: Normal NECK: mild JVD; No carotid bruits LYMPHATICS: No lymphadenopathy CARDIAC: RRR, no murmurs, rubs, gallops RESPIRATORY:  Clear to auscultation without rales, wheezing or rhonchi  ABDOMEN: Soft, non-tender, non-distended MUSCULOSKELETAL:  B LE non-pitting edema; No deformity  SKIN: Warm and dry NEUROLOGIC:  Alert and oriented x 3 PSYCHIATRIC:  Normal affect   ASSESSMENT:    1. Acute on chronic diastolic heart failure ([place])   2. Edema, unspecified type   3. Essential hypertension   4. Paroxysmal  atrial fibrillation (HCC)   5. Anemia, unspecified type    PLAN:  In order of problems listed above:  Acute on chronic diastolic heart failure (HCC) Edema, unspecified type  She describes SOB, DOE, orthopnea, leg and abdominal swelling. I suspect she is having an exacerbation of dCHF. She has lost 6 lbs after only one dose of lasix. Given her renal function, I want to be very careful with her diuresis. I agree with continuing 40 mg lasix and 10 mEq potassium for 1 week. But I suspect she will only need to be on this dose of lasix for a short time. She became nauseous this morning and ate ice cream to relieve her nausea. If she has problems with 40 mg lasix, we can decrease to 20 mg without potassium and track her progress. I want her to have close follow up.   Essential hypertension Reasonably controlled. No changes.  Paroxysmal atrial fibrillation (HCC)  Anemia, unspecified type  She was anemic yesterday, Hb 9.5. she reports no obvious signs of bleeding. She has been stable on 5 mg eliquis BID. Her sCr is 1.40, she does not technically meet criteria for reduced dose of eliquis. However, we will follow Hb and renal function closely.    Follow up next week. Her granddaughter can bring her early in the morning. If she needs to reschedule, she is amenable to a telehealth visit. She is aware she will need labs.   Medication Adjustments/Labs and Tests Ordered: Current medicines are reviewed at length with the patient today.  Concerns regarding medicines are outlined above.  Orders Placed This Encounter  Procedures  . Basic metabolic panel  . CBC   No orders of the defined types were placed in this encounter.   Signed, Tami Lin Duke, PA  11/03/2018 3:01 PM    [facility]

## 2018-11-03 ENCOUNTER — Other Ambulatory Visit: Payer: Self-pay

## 2018-11-03 ENCOUNTER — Other Ambulatory Visit: Payer: Self-pay | Admitting: Nurse Practitioner

## 2018-11-03 ENCOUNTER — Encounter: Payer: Self-pay | Admitting: Physician Assistant

## 2018-11-03 ENCOUNTER — Ambulatory Visit (INDEPENDENT_AMBULATORY_CARE_PROVIDER_SITE_OTHER): Payer: Medicare Other | Admitting: Physician Assistant

## 2018-11-03 VITALS — BP 143/63 | HR 60 | Temp 95.7°F | Ht 66.0 in | Wt 190.6 lb

## 2018-11-03 DIAGNOSIS — R609 Edema, unspecified: Secondary | ICD-10-CM | POA: Diagnosis not present

## 2018-11-03 DIAGNOSIS — D649 Anemia, unspecified: Secondary | ICD-10-CM | POA: Diagnosis not present

## 2018-11-03 DIAGNOSIS — I10 Essential (primary) hypertension: Secondary | ICD-10-CM

## 2018-11-03 DIAGNOSIS — I5033 Acute on chronic diastolic (congestive) heart failure: Secondary | ICD-10-CM | POA: Diagnosis not present

## 2018-11-03 DIAGNOSIS — R7989 Other specified abnormal findings of blood chemistry: Secondary | ICD-10-CM

## 2018-11-03 DIAGNOSIS — I480 Paroxysmal atrial fibrillation: Secondary | ICD-10-CM

## 2018-11-03 NOTE — Patient Instructions (Signed)
Medication Instructions:  Your physician recommends that you continue on your current medications as directed. Please refer to the Current Medication list given to you today.  Please continue Lasix 40 mg and Potassium 10 mEq.  If you need a refill on your cardiac medications before your next appointment, please call your pharmacy.   Lab work: Your physician recommends that you return for lab work when you come for your follow-up visit: BMET, CBC  If you have labs (blood work) drawn today and your tests are completely normal, you will receive your results only by: Marland Kitchen MyChart Message (if you have MyChart) OR . A paper copy in the mail If you have any lab test that is abnormal or we need to change your treatment, we will call you to review the results.   Follow-Up: At [facility], you and your health needs are our priority.  As part of our continuing mission to provide you with exceptional heart care, we have created designated Provider Care Teams.  These Care Teams include your primary Cardiologist (physician) and Advanced Practice Providers (APPs -  Physician Assistants and Nurse Practitioners) who all work together to provide you with the care you need, when you need it. . You have been scheduled for a follow-up in office visit with Jory Sims, DNP on Wednesday, 11/11/18 at 8:15 AM.

## 2018-11-05 ENCOUNTER — Other Ambulatory Visit: Payer: Medicare Other

## 2018-11-05 ENCOUNTER — Other Ambulatory Visit: Payer: Self-pay

## 2018-11-05 DIAGNOSIS — R7989 Other specified abnormal findings of blood chemistry: Secondary | ICD-10-CM

## 2018-11-05 DIAGNOSIS — D649 Anemia, unspecified: Secondary | ICD-10-CM

## 2018-11-05 LAB — CBC WITH DIFFERENTIAL/PLATELET
Absolute Monocytes: 1139 cells/uL — ABNORMAL HIGH (ref 200–950)
Basophils Absolute: 62 cells/uL (ref 0–200)
Basophils Relative: 0.7 %
Eosinophils Absolute: 249 cells/uL (ref 15–500)
Eosinophils Relative: 2.8 %
HCT: 31.5 % — ABNORMAL LOW (ref 35.0–45.0)
Hemoglobin: 9.7 g/dL — ABNORMAL LOW (ref 11.7–15.5)
Lymphs Abs: 1424 cells/uL (ref 850–3900)
MCH: 26.6 pg — ABNORMAL LOW (ref 27.0–33.0)
MCHC: 30.8 g/dL — ABNORMAL LOW (ref 32.0–36.0)
MCV: 86.5 fL (ref 80.0–100.0)
MPV: 11.2 fL (ref 7.5–12.5)
Monocytes Relative: 12.8 %
Neutro Abs: 6025 cells/uL (ref 1500–7800)
Neutrophils Relative %: 67.7 %
Platelets: 255 10*3/uL (ref 140–400)
RBC: 3.64 10*6/uL — ABNORMAL LOW (ref 3.80–5.10)
RDW: 15.2 % — ABNORMAL HIGH (ref 11.0–15.0)
Total Lymphocyte: 16 %
WBC: 8.9 10*3/uL (ref 3.8–10.8)

## 2018-11-06 ENCOUNTER — Other Ambulatory Visit: Payer: Self-pay

## 2018-11-06 ENCOUNTER — Ambulatory Visit: Payer: Medicare Other | Admitting: Podiatry

## 2018-11-08 LAB — COMPLETE METABOLIC PANEL WITH GFR
AG Ratio: 1.4 (calc) (ref 1.0–2.5)
ALT: 15 U/L (ref 6–29)
AST: 14 U/L (ref 10–35)
Albumin: 3.8 g/dL (ref 3.6–5.1)
Alkaline phosphatase (APISO): 78 U/L (ref 37–153)
BUN/Creatinine Ratio: 19 (calc) (ref 6–22)
BUN: 26 mg/dL — ABNORMAL HIGH (ref 7–25)
CO2: 27 mmol/L (ref 20–32)
Calcium: 9.8 mg/dL (ref 8.6–10.4)
Chloride: 105 mmol/L (ref 98–110)
Creat: 1.4 mg/dL — ABNORMAL HIGH (ref 0.60–0.88)
GFR, Est African American: 40 mL/min/{1.73_m2} — ABNORMAL LOW (ref >=60)
GFR, Est Non African American: 34 mL/min/{1.73_m2} — ABNORMAL LOW (ref >=60)
Globulin: 2.7 g/dL (calc) (ref 1.9–3.7)
Glucose, Bld: 122 mg/dL (ref 65–139)
Potassium: 5.2 mmol/L (ref 3.5–5.3)
Sodium: 138 mmol/L (ref 135–146)
Total Bilirubin: 0.4 mg/dL (ref 0.2–1.2)
Total Protein: 6.5 g/dL (ref 6.1–8.1)

## 2018-11-08 LAB — TEST AUTHORIZATION

## 2018-11-08 LAB — CBC WITH DIFFERENTIAL/PLATELET
Absolute Monocytes: 993 cells/uL — ABNORMAL HIGH (ref 200–950)
Basophils Absolute: 54 cells/uL (ref 0–200)
Basophils Relative: 0.7 %
Eosinophils Absolute: 200 cells/uL (ref 15–500)
Eosinophils Relative: 2.6 %
HCT: 30.1 % — ABNORMAL LOW (ref 35.0–45.0)
Hemoglobin: 9.5 g/dL — ABNORMAL LOW (ref 11.7–15.5)
Lymphs Abs: 1286 cells/uL (ref 850–3900)
MCH: 27.1 pg (ref 27.0–33.0)
MCHC: 31.6 g/dL — ABNORMAL LOW (ref 32.0–36.0)
MCV: 86 fL (ref 80.0–100.0)
MPV: 11.3 fL (ref 7.5–12.5)
Monocytes Relative: 12.9 %
Neutro Abs: 5167 cells/uL (ref 1500–7800)
Neutrophils Relative %: 67.1 %
Platelets: 226 10*3/uL (ref 140–400)
RBC: 3.5 10*6/uL — ABNORMAL LOW (ref 3.80–5.10)
RDW: 15.1 % — ABNORMAL HIGH (ref 11.0–15.0)
Total Lymphocyte: 16.7 %
WBC: 7.7 10*3/uL (ref 3.8–10.8)

## 2018-11-08 LAB — IRON,TIBC AND FERRITIN PANEL
%SAT: 7 % (calc) — ABNORMAL LOW (ref 16–45)
Ferritin: 13 ng/mL — ABNORMAL LOW (ref 16–288)
Iron: 33 ug/dL — ABNORMAL LOW (ref 45–160)
TIBC: 456 mcg/dL (calc) — ABNORMAL HIGH (ref 250–450)

## 2018-11-08 LAB — HEMOGLOBIN A1C
Hgb A1c MFr Bld: 7.2 % of total Hgb — ABNORMAL HIGH (ref <5.7)
Mean Plasma Glucose: 160 (calc)
eAG (mmol/L): 8.9 (calc)

## 2018-11-08 LAB — BRAIN NATRIURETIC PEPTIDE: Brain Natriuretic Peptide: 350 pg/mL — ABNORMAL HIGH (ref <100)

## 2018-11-10 ENCOUNTER — Ambulatory Visit (INDEPENDENT_AMBULATORY_CARE_PROVIDER_SITE_OTHER): Payer: Medicare Other | Admitting: *Deleted

## 2018-11-10 DIAGNOSIS — I480 Paroxysmal atrial fibrillation: Secondary | ICD-10-CM

## 2018-11-10 DIAGNOSIS — I63442 Cerebral infarction due to embolism of left cerebellar artery: Secondary | ICD-10-CM

## 2018-11-10 LAB — CUP PACEART REMOTE DEVICE CHECK
Date Time Interrogation Session: 20200811161714
Implantable Pulse Generator Implant Date: 20180502

## 2018-11-10 NOTE — Progress Notes (Deleted)
Cardiology Office Note   Date:  11/10/2018   ID:  Penny, Miller Jul 30, 1931, MRN [id number]  PCP:  [name], DO  Cardiologist: Dr.Hochrein  No chief complaint on file.    History of Present Illness: Penny Miller is a 85 y.o. female who presents for ongoing assessment and management of paroxysmal atrial fibrillation (diagnosed after an ILR was implanted following CVA), history of embolic stroke, and aneurysm of the vertebral artery, other history includes hypertension, chronic diastolic heart failure.  Most recent echocardiogram in November 2019 revealed preserved LVEF of 55% to 60% but severely increased PA pressure and dilated IVC, pulmonary hypertension.  She was last seen in the office on 11/03/2018 by Fabian Sharp, [place], on 11/03/2018.  Penny Miller was complaining of weight gain and fluid retention.  She had seen her PCP yesterday who started her on 40 mg of Lasix and 10 mEq of potassium and had taken 2 doses prior to coming to the office with good urinary output, having lost 6 pounds since starting on higher dose of Lasix.  She was continued on current regiment that was recommended to only do this for 1 week and was to follow-up in the office in a day to check her weight and symptoms.  She did report that she had some nausea after taking second dose of furosemide.  A follow-up BMET and CBC were ordered.  Labs on 11/02/2018 revealed a sodium 138 and potassium 5.2 with creatinine 1.40.  B and P was 350.  CBC on 11/05/2018 revealed hemoglobin of 9.7 with hematocrit of 31.5.  Platelets were normal at 255.   Past Medical History:  Diagnosis Date  . Allergic rhinitis, cause unspecified   . Anxiety state, unspecified   . Diaphragmatic hernia without mention of obstruction or gangrene   . Diverticulosis of colon (without mention of hemorrhage)   . Female stress incontinence   . Insomnia, unspecified   . Interstitial cystitis    surgery 02/01/2013  . Irritable bowel syndrome   . Memory  loss   . Other and unspecified hyperlipidemia   . Other premature beats   . Polyneuropathy in diabetes(357.2)   . Postmenopausal bleeding   . Proteinuria   . Stroke ([place])   . 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 Unspecified essential hypertension   . Viral warts, unspecified     Past Surgical History:  Procedure Laterality Date  . ABDOMINAL HYSTERECTOMY  1977   Dr.Hambright  . APPENDECTOMY  1965  . CATARACT EXTRACTION, BILATERAL  02/10/2006   Dr.Groat   . COLONOSCOPY  07/17/2004   polypectomy  . [place]  . LOOP RECORDER INSERTION N/A 07/31/2016   Procedure: Loop Recorder Insertion;  Surgeon: [name], MD;  Location: [facility];  Service: Cardiovascular;  Laterality: N/A;  . TEE WITHOUT CARDIOVERSION N/A 07/30/2016   Procedure: TRANSESOPHAGEAL ECHOCARDIOGRAM (TEE);  Surgeon: [name], MD;  Location: [facility];  Service: Cardiovascular;  Laterality: N/A;  . [place]  . TRIGGER FINGER RELEASE  05/13/16  . urinary bladder      surgery 02/01/2013 to stretch bladder stem     Current Outpatient Medications  Medication Sig Dispense Refill  . ACCU-CHEK AVIVA PLUS test strip USE TO TEST BLOOD SUGAR TWICE DAILY. 100 each 11  . albuterol (VENTOLIN HFA) 108 (90 Base) MCG/ACT inhaler Inhale 2 puffs into  the lungs every 6 (six) hours as needed for wheezing or shortness of breath. 18 g 0  . amLODipine (NORVASC) 2.5 MG tablet Take 1 tablet (2.5 mg total) by mouth daily. 180 tablet 1  . atenolol (TENORMIN) 25 MG tablet TAKE ONE-HALF (1/2) TABLET DAILY 45 tablet 3  . atorvastatin (LIPITOR) 40 MG tablet TAKE 1 TABLET DAILY AT 6 P.M. 90 tablet 3  . citalopram (CELEXA) 10 MG tablet Take 1 tablet (10 mg total) by mouth daily. 90 tablet 1  . ELIQUIS 5 MG TABS tablet TAKE 1 TABLET TWICE A DAY 180 tablet 3  . furosemide (LASIX) 40 MG tablet Take 1  tablet (40 mg total) by mouth daily. 30 tablet 3  . gabapentin (NEURONTIN) 300 MG capsule Take 1 capsule (300 mg total) by mouth 3 (three) times daily. 270 capsule 3  . Lancets (ACCU-CHEK MULTICLIX) lancets Use to test blood sugar twice daily. E11.29 100 each 11  . pioglitazone (ACTOS) 15 MG tablet TAKE 1 TABLET DAILY 90 tablet 3  . potassium chloride 20 MEQ TBCR Take 10 mEq by mouth daily. 30 tablet 1  . Vitamin D, Ergocalciferol, (DRISDOL) 1.25 MG (50000 UT) CAPS capsule TAKE 1 CAPSULE ONCE WEEKLY FOR VITAMIN D 12 capsule 3   No current facility-administered medications for this visit.     Allergies:   Jardiance [empagliflozin], Metformin and related, Ceclor [cefaclor], Codeine, Lopid [gemfibrozil], Metoprolol, Morphine and related, Niacin, Niacin and related, Other, Relafen [nabumetone], Septra [sulfamethoxazole-trimethoprim], Tetracycline, Toprol xl [metoprolol tartrate], and Adhesive [tape]    Social History:  The patient  reports that she quit smoking about 7 years ago. She has a 15.50 pack-year smoking history. She has never used smokeless tobacco. She reports that she does not drink alcohol or use drugs.   Family History:  The patient's family history includes Cancer in her father and mother; Diabetes in her brother, brother, and sister; Heart attack in her father; Heart disease (age of onset: 39) in her brother; Hypertension in her brother and sister; Kidney cancer in her brother.    ROS: All other systems are reviewed and negative. Unless otherwise mentioned in H&P    PHYSICAL EXAM: VS:  There were no vitals taken for this visit. , BMI There is no height or weight on file to calculate BMI. GEN: Well nourished, well developed, in no acute distress HEENT: normal Neck: no JVD, carotid bruits, or masses Cardiac: ***RRR; no murmurs, rubs, or gallops,no edema  Respiratory:  Clear to auscultation bilaterally, normal work of breathing GI: soft, nontender, nondistended, + BS MS: no  deformity or atrophy Skin: warm and dry, no rash Neuro:  Strength and sensation are intact Psych: euthymic mood, full affect   EKG:  EKG {ACTION; IS/IS [id number]:[id number]} ordered today. The ekg ordered today demonstrates ***   Recent Labs: 02/04/2018: Magnesium 1.9 11/02/2018: ALT 15; Brain Natriuretic Peptide 350; BUN 26; Creat 1.40; Potassium 5.2; Sodium 138 11/05/2018: Hemoglobin 9.7; Platelets 255    Lipid Panel    Component Value Date/Time   CHOL 135 03/17/2017 0920   CHOL 159 06/23/2015 0927   TRIG 179 (H) 03/17/2017 0920   HDL 40 (L) 03/17/2017 0920   HDL 43 06/23/2015 0927   CHOLHDL 3.4 03/17/2017 0920   VLDL 29 11/05/2016 0906   LDLCALC 69 03/17/2017 0920      Wt Readings from Last 3 Encounters:  11/03/18 190 lb 9.6 oz (86.5 kg)  11/02/18 193 lb 6.4 oz (87.7 kg)  05/27/18 181 lb 3.2 oz (  82.2 kg)      Other studies Reviewed: Echo 02/05/18 Study Conclusions - Left ventricle: The cavity size was normal. There was moderate concentric hypertrophy. Systolic function was normal. The estimated ejection fraction was in the range of 55% to 60%. Wall motion was normal; there were no regional wall motion abnormalities. The study was not technically sufficient to allow evaluation of LV diastolic dysfunction due to atrial fibrillation. - Aortic valve: Moderately calcified annulus. Trileaflet; mildly thickened leaflets. There was very mild stenosis. Mean gradient (S): 6 mm Hg. - Aorta: The aorta was moderately calcified. - Mitral valve: Calcified annulus. There was mild regurgitation. - Left atrium: The atrium was mildly to moderately dilated. - Right ventricle: The cavity size was mildly dilated. Wall thickness was normal. Systolic function was low normal. - Right atrium: The atrium was severely dilated. - Atrial septum: No defect or patent foramen ovale was identified. - Tricuspid valve: There was mild regurgitation. - Pulmonic valve: There was trivial  regurgitation. - Pulmonary arteries: Systolic pressure was severely increased. PA peak pressure: 59 mm Hg (S). - Inferior vena cava: The vessel was dilated. The respirophasic diameter changes were blunted (<50%), consistent with elevated central venous pressure.   ASSESSMENT AND PLAN:  1.  ***   Current medicines are reviewed at length with the patient today.    Labs/ tests ordered today include: *** Phill Myron. [place] Pugh, ANP, [facility]   11/10/2018 8:33 AM    [facility] [address] Office [phone] Fax [phone]

## 2018-11-11 ENCOUNTER — Ambulatory Visit: Payer: Medicare Other | Admitting: Adult Health

## 2018-11-11 ENCOUNTER — Encounter: Payer: Self-pay | Admitting: Cardiology

## 2018-11-11 ENCOUNTER — Other Ambulatory Visit: Payer: Self-pay

## 2018-11-11 ENCOUNTER — Ambulatory Visit (INDEPENDENT_AMBULATORY_CARE_PROVIDER_SITE_OTHER): Payer: Medicare Other | Admitting: General Practice

## 2018-11-11 VITALS — BP 118/58 | HR 54 | Ht 68.0 in | Wt 186.0 lb

## 2018-11-11 DIAGNOSIS — R609 Edema, unspecified: Secondary | ICD-10-CM | POA: Diagnosis not present

## 2018-11-11 DIAGNOSIS — I5033 Acute on chronic diastolic (congestive) heart failure: Secondary | ICD-10-CM

## 2018-11-11 DIAGNOSIS — I639 Cerebral infarction, unspecified: Secondary | ICD-10-CM

## 2018-11-11 DIAGNOSIS — I480 Paroxysmal atrial fibrillation: Secondary | ICD-10-CM | POA: Diagnosis not present

## 2018-11-11 DIAGNOSIS — D649 Anemia, unspecified: Secondary | ICD-10-CM | POA: Diagnosis not present

## 2018-11-11 DIAGNOSIS — R0609 Other forms of dyspnea: Secondary | ICD-10-CM | POA: Diagnosis not present

## 2018-11-11 DIAGNOSIS — I10 Essential (primary) hypertension: Secondary | ICD-10-CM | POA: Diagnosis not present

## 2018-11-11 LAB — BASIC METABOLIC PANEL WITH GFR
BUN/Creatinine Ratio: 16 (ref 12–28)
BUN: 25 mg/dL (ref 8–27)
CO2: 21 mmol/L (ref 20–29)
Calcium: 9.8 mg/dL (ref 8.7–10.3)
Chloride: 99 mmol/L (ref 96–106)
Creatinine, Ser: 1.61 mg/dL — ABNORMAL HIGH (ref 0.57–1.00)
GFR calc Af Amer: 33 mL/min/{1.73_m2} — ABNORMAL LOW
GFR calc non Af Amer: 29 mL/min/{1.73_m2} — ABNORMAL LOW
Glucose: 194 mg/dL — ABNORMAL HIGH (ref 65–99)
Potassium: 4.9 mmol/L (ref 3.5–5.2)
Sodium: 136 mmol/L (ref 134–144)

## 2018-11-11 LAB — CBC
Hematocrit: 34.2 % (ref 34.0–46.6)
Hemoglobin: 10.4 g/dL — ABNORMAL LOW (ref 11.1–15.9)
MCH: 26.3 pg — ABNORMAL LOW (ref 26.6–33.0)
MCHC: 30.4 g/dL — ABNORMAL LOW (ref 31.5–35.7)
MCV: 86 fL (ref 79–97)
Platelets: 269 10*3/uL (ref 150–450)
RBC: 3.96 x10E6/uL (ref 3.77–5.28)
RDW: 14.9 % (ref 11.7–15.4)
WBC: 10.5 10*3/uL (ref 3.4–10.8)

## 2018-11-11 NOTE — Patient Instructions (Addendum)
Medication Instructions:  DECREASE- Furosemide(Lasix) 20 mg daily STOP- Potassium   If you need a refill on your cardiac medications before your next appointment, please call your pharmacy.  Labwork: None Ordered   Testing/Procedures: Your physician has requested that you have an echocardiogram. Echocardiography is a painless test that uses sound waves to create images of your heart. It provides your doctor with information about the size and shape of your heart and how well your heart's chambers and valves are working. This procedure takes approximately one hour. There are no restrictions for this procedure.   Follow-Up: . Your physician recommends that you schedule a follow-up appointment in: 2-3 weeks with [facility], you and your health needs are our priority.  As part of our continuing mission to provide you with exceptional heart care, we have created designated Provider Care Teams.  These Care Teams include your primary Cardiologist (physician) and Advanced Practice Providers (APPs -  Physician Assistants and Nurse Practitioners) who all work together to provide you with the care you need, when you need it.  Thank you for choosing [facility]!!         Iron-Rich Diet  Iron is a mineral that helps your body to produce hemoglobin. Hemoglobin is a protein in red blood cells that carries oxygen to your body's tissues. Eating too little iron may cause you to feel weak and tired, and it can increase your risk of infection. Iron is naturally found in many foods, and many foods have iron added to them (iron-fortified foods). You may need to follow an iron-rich diet if you do not have enough iron in your body due to certain medical conditions. The amount of iron that you need each day depends on your age, your sex, and any medical conditions you have. Follow instructions from your health care provider or a diet and nutrition specialist (dietitian) about  how much iron you should eat each day. What are tips for following this plan? Reading food labels  Check food labels to see how many milligrams (mg) of iron are in each serving. Cooking  Cook foods in pots and pans that are made from iron.  Take these steps to make it easier for your body to absorb iron from certain foods: ? Soak beans overnight before cooking. ? Soak whole grains overnight and drain them before using. ? Ferment flours before baking, such as by using yeast in bread dough. Meal planning  When you eat foods that contain iron, you should eat them with foods that are high in vitamin C. These include oranges, peppers, tomatoes, potatoes, and mango. Vitamin C helps your body to absorb iron. General information  Take iron supplements only as told by your health care provider. An overdose of iron can be life-threatening. If you were prescribed iron supplements, take them with orange juice or a vitamin C supplement.  When you eat iron-fortified foods or take an iron supplement, you should also eat foods that naturally contain iron, such as meat, poultry, and fish. Eating naturally iron-rich foods helps your body to absorb the iron that is added to other foods or contained in a supplement.  Certain foods and drinks prevent your body from absorbing iron properly. Avoid eating these foods in the same meal as iron-rich foods or with iron supplements. These foods include: ? Coffee, black tea, and red wine. ? Milk, dairy products, and foods that are high in calcium. ? Beans and soybeans. ? Whole grains.  What foods should I eat? Fruits Prunes. Raisins. Eat fruits high in vitamin C, such as oranges, grapefruits, and strawberries, alongside iron-rich foods. Vegetables Spinach (cooked). Green peas. Broccoli. Fermented vegetables. Eat vegetables high in vitamin C, such as leafy greens, potatoes, bell peppers, and tomatoes, alongside iron-rich foods. Grains Iron-fortified breakfast  cereal. Iron-fortified whole-wheat bread. Enriched rice. Sprouted grains. Meats and other proteins Beef liver. Oysters. Beef. Shrimp. [place]. Chicken. [place]. Sardines. Chickpeas. Nuts. Tofu. Pumpkin seeds. Beverages Tomato juice. Fresh orange juice. Prune juice. Hibiscus tea. Fortified instant breakfast shakes. Sweets and desserts Blackstrap molasses. Seasonings and condiments Tahini. Fermented soy sauce. Other foods Wheat germ. The items listed above may not be a complete list of recommended foods and beverages. Contact a dietitian for more information. What foods should I avoid? Grains Whole grains. Bran cereal. Bran flour. Oats. Meats and other proteins Soybeans. Products made from soy protein. Black beans. Lentils. Mung beans. Split peas. Dairy Milk. Cream. Cheese. Yogurt. Cottage cheese. Beverages Coffee. Black tea. Red wine. Sweets and desserts Cocoa. Chocolate. Ice cream. Other foods Basil. Oregano. Large amounts of parsley. The items listed above may not be a complete list of foods and beverages to avoid. Contact a dietitian for more information. Summary  Iron is a mineral that helps your body to produce hemoglobin. Hemoglobin is a protein in red blood cells that carries oxygen to your body's tissues.  Iron is naturally found in many foods, and many foods have iron added to them (iron-fortified foods).  When you eat foods that contain iron, you should eat them with foods that are high in vitamin C. Vitamin C helps your body to absorb iron.  Certain foods and drinks prevent your body from absorbing iron properly, such as whole grains and dairy products. You should avoid eating these foods in the same meal as iron-rich foods or with iron supplements. This information is not intended to replace advice given to you by your health care provider. Make sure you discuss any questions you have with your health care provider. Document Released: 10/30/2004 Document Revised: 02/28/2017  Document Reviewed: 02/11/2017 Elsevier Patient Education  2020 [employer].

## 2018-11-11 NOTE — Progress Notes (Signed)
Cardiology Clinic Note   Patient Name: Penny Miller Date of Encounter: 11/11/2018  Primary Care Provider:  [name], DO Primary Cardiologist:  Minus Breeding, MD  Patient Profile    Penny Miller 85 year old female presents today for follow-up for her lower extremity swelling and anemia.  Past Medical History    Past Medical History:  Diagnosis Date  . Allergic rhinitis, cause unspecified   . Anxiety state, unspecified   . Diaphragmatic hernia without mention of obstruction or gangrene   . Diverticulosis of colon (without mention of hemorrhage)   . Female stress incontinence   . Insomnia, unspecified   . Interstitial cystitis    surgery 02/01/2013  . Irritable bowel syndrome   . Memory loss   . Other and unspecified hyperlipidemia   . Other premature beats   . Polyneuropathy in diabetes(357.2)   . Postmenopausal bleeding   . Proteinuria   . Stroke ([place])   . 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 Unspecified essential hypertension   . Viral warts, unspecified    Past Surgical History:  Procedure Laterality Date  . ABDOMINAL HYSTERECTOMY  1977   Dr.Hambright  . APPENDECTOMY  1965  . CATARACT EXTRACTION, BILATERAL  02/10/2006   Dr.Groat   . COLONOSCOPY  07/17/2004   polypectomy  . [place]  . LOOP RECORDER INSERTION N/A 07/31/2016   Procedure: Loop Recorder Insertion;  Surgeon: [name], MD;  Location: [facility];  Service: Cardiovascular;  Laterality: N/A;  . TEE WITHOUT CARDIOVERSION N/A 07/30/2016   Procedure: TRANSESOPHAGEAL ECHOCARDIOGRAM (TEE);  Surgeon: [name], MD;  Location: [facility];  Service: Cardiovascular;  Laterality: N/A;  . [place]  . TRIGGER FINGER RELEASE  05/13/16  . urinary bladder      surgery 02/01/2013 to stretch bladder stem    Allergies  Allergies  Allergen Reactions  .  Jardiance [Empagliflozin] Diarrhea  . Metformin And Related Diarrhea  . Ceclor [Cefaclor] Other (See Comments)    Unknown reaction; not recalled  . Codeine Nausea And Vomiting  . Lopid [Gemfibrozil] Other (See Comments)    Unknown reaction; not recalled  . Metoprolol Nausea And Vomiting  . Morphine And Related Other (See Comments)    Passed out  . Niacin Nausea And Vomiting    Unknown reaction Unknown reaction; not recalled  . Niacin And Related Other (See Comments)    Unknown reaction; not recalled  . Other   . Relafen [Nabumetone] Nausea And Vomiting  . Septra [Sulfamethoxazole-Trimethoprim] Nausea And Vomiting  . Tetracycline Nausea And Vomiting  . Toprol Xl [Metoprolol Tartrate] Nausea And Vomiting  . Adhesive [Tape] Rash and Other (See Comments)    Tears the skin also    History of Present Illness    Penny Miller was last seen by Fabian Sharp PA-C 11/03/2018.  During that time she indicated she had lost about 6 pounds after only taking 1 dose of Lasix and presents of acute exacerbation of diastolic CHF.  She was continued on  40 mg of Lasix daily and 10 mEq of potassium for 1 week.  She noted some nausea with Lasix dosing.  Her PMH also includes essential hypertension, embolic stroke aneurysm of vertebral artery, paroxysmal atrial fibrillation, acute respiratory failure with hypoxia diabetes type 2, deaf, CKD stage III, memory loss, hyperlipidemia, insomnia, dyspnea, overweight BMI 25-29.9, and major depression.  She presents to the clinic today and states that she is started to feel better over the last week however, she still feels like she does not have very much energy.  She becomes short of breath while doing normal daily activities like walking to her kitchen and hanging clothes on her close line.  She states she has had cravings for chocolate ice cream and has been eating 1 tub per week.  She feels like her swelling has decreased but has not totally gone away.  She goes on to  state that she had a virus in December and has not felt 100% well since that time.  Shortly after her hospital admission and respiratory virus she began to notice lower extremity swelling.  Finally, she states that she has a stool collection kit at home that was provided to her by Dr. Cyndi Lennert office to assess for blood in her stool.  She denies chest pain,increased lower extremity edema,  palpitations, melena, hematuria, hemoptysis, diaphoresis, weakness, presyncope, syncope, orthopnea, and PND.   Home Medications    Prior to Admission medications   Medication Sig Start Date End Date Taking? Authorizing Provider  ACCU-CHEK AVIVA PLUS test strip USE TO TEST BLOOD SUGAR TWICE DAILY. 09/11/18   Reed, [name] L, DO  albuterol (VENTOLIN HFA) 108 (90 Base) MCG/ACT inhaler Inhale 2 puffs into the lungs every 6 (six) hours as needed for wheezing or shortness of breath. 10/09/18   Reed, [name] L, DO  amLODipine (NORVASC) 2.5 MG tablet Take 1 tablet (2.5 mg total) by mouth daily. 06/30/18   Reed, [name] L, DO  atenolol (TENORMIN) 25 MG tablet TAKE ONE-HALF (1/2) TABLET DAILY 09/29/18   Reed, [name] L, DO  atorvastatin (LIPITOR) 40 MG tablet TAKE 1 TABLET DAILY AT 6 P.M. 10/05/18   Reed, [name] L, DO  citalopram (CELEXA) 10 MG tablet Take 1 tablet (10 mg total) by mouth daily. 06/29/18   Reed, [name] L, DO  ELIQUIS 5 MG TABS tablet TAKE 1 TABLET TWICE A DAY 09/29/18   Sherran Needs, NP  furosemide (LASIX) 40 MG tablet Take 1 tablet (40 mg total) by mouth daily. 11/02/18   Lauree Chandler, NP  gabapentin (NEURONTIN) 300 MG capsule Take 1 capsule (300 mg total) by mouth 3 (three) times daily. 05/27/18   Lomax, Amy, NP  Lancets (ACCU-CHEK MULTICLIX) lancets Use to test blood sugar twice daily. E11.29 07/04/17   Reed, [name] L, DO  pioglitazone (ACTOS) 15 MG tablet TAKE 1 TABLET DAILY 09/29/18   Reed, [name] L, DO  potassium chloride 20 MEQ TBCR Take 10 mEq by mouth daily. 11/02/18   Lauree Chandler, NP   Vitamin D, Ergocalciferol, (DRISDOL) 1.25 MG (50000 UT) CAPS capsule TAKE 1 CAPSULE ONCE WEEKLY FOR VITAMIN D 07/20/18   [name], DO    Family History    Family History  Problem Relation Age of Onset  . Cancer Mother        pancreatic  . Cancer Father        bladder  . Heart attack Father   . Hypertension Sister   . Diabetes Sister   . Diabetes Brother   . Kidney cancer Brother   . Hypertension Brother   . Diabetes Brother   . Heart disease Brother 83       Myocardial Infarction    She indicated that her mother is deceased. She indicated that her father is deceased. She indicated that her sister is alive. She indicated that all  of her six brothers are deceased. She indicated that her maternal grandmother is deceased. She indicated that her maternal grandfather is deceased. She indicated that her paternal grandmother is deceased. She indicated that her paternal grandfather is deceased. She indicated that her son is alive.  Social History    Social History   Socioeconomic History  . Marital status: Widowed    Spouse name: Not on file  . Number of children: 1  . Years of education: Not on file  . Highest education level: Not on file  Occupational History  . 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 Financial resource strain: Not hard at all  . Food insecurity    Worry: Never true    Inability: Never true  . Transportation needs    Medical: No    Non-medical: No  Tobacco Use  . Smoking status: Former Smoker    Packs/day: 0.25    Years: 62.00    Pack years: 15.50    Quit date: 04/02/2011    Years since quitting: 7.6  . Smokeless tobacco: Never Used  . Tobacco comment: stopped consistently smoking in 2012 but still smokes occasionally (one pack per week)  Substance and Sexual Activity  . Alcohol use: No  . Drug use: No  . Sexual activity: Never  Lifestyle  . Physical activity    Days per week: 7 days    Minutes per session: 10 min  .  Stress: Not at all  Relationships  . Social connections    Talks on phone: More than three times a week    Gets together: More than three times a week    Attends religious service: Never    Active member of club or organization: No    Attends meetings of clubs or organizations: Never    Relationship status: Widowed  . Intimate partner violence    Fear of current or ex partner: No    Emotionally abused: No    Physically abused: No    Forced sexual activity: No  Other Topics Concern  . Not on file  Social History Narrative   Ophthalmologist-Dr.Gould and Dr.Groat   Podiatrist- Dr.Tuchman   Dermatologist- Dr.Lupton   Urologist- Dr. Lawerance Bach    Lives with her son.      Review of Systems    General:  No chills, fever, night sweats or weight changes.  Cardiovascular:  No chest pain,+ dyspnea on exertion, +edema, orthopnea, palpitations, paroxysmal nocturnal dyspnea. Dermatological: No rash, lesions/masses Respiratory: No cough, dyspnea Urologic: No hematuria, dysuria Abdominal:   No nausea, vomiting, diarrhea, bright red blood per rectum, melena, or hematemesis Neurologic:  No visual changes, wkns, changes in mental status. All other systems reviewed and are otherwise negative except as noted above.  Physical Exam    VS:  BP (!) 118/58 (BP Location: Left Arm, Patient Position: Sitting, Cuff Size: Normal)   Pulse (!) 54   Ht '5\' 8"'  (1.727 m)   Wt 186 lb (84.4 kg)   SpO2 98%   BMI 28.28 kg/m  , BMI Body mass index is 28.28 kg/m. GEN: Well nourished, well developed, in no acute distress. HEENT: normal. Neck: Supple, no JVD, carotid bruits, or masses. Cardiac: Sinus bradycardia with first-degree AV block, no murmurs, rubs, or gallops. No clubbing, cyanosis, slight lower extremity edema.  Radials/DP/PT 2+ and equal bilaterally.  Respiratory:  Respirations regular and unlabored, clear to auscultation bilaterally. GI: Soft, nontender, nondistended, BS + x 4. MS:  no deformity or  atrophy. Skin: warm and dry, no rash. Neuro:  Strength and sensation are intact. Psych: Normal affect.  Accessory Clinical Findings    ECG personally reviewed by me today-sinus bradycardia with first-degree AV block 54 bpm  EKG 02/01/2018: Sinus rhythm 75 bpm  Echocardiogram 02/05/2018: Study Conclusions  - Left ventricle: The cavity size was normal. There was moderate   concentric hypertrophy. Systolic function was normal. The   estimated ejection fraction was in the range of 55% to 60%. Wall   motion was normal; there were no regional wall motion   abnormalities. The study was not technically sufficient to allow   evaluation of LV diastolic dysfunction due to atrial   fibrillation. - Aortic valve: Moderately calcified annulus. Trileaflet; mildly   thickened leaflets. There was very mild stenosis. Mean gradient   (S): 6 mm Hg. - Aorta: The aorta was moderately calcified. - Mitral valve: Calcified annulus. There was mild regurgitation. - Left atrium: The atrium was mildly to moderately dilated. - Right ventricle: The cavity size was mildly dilated. Wall   thickness was normal. Systolic function was low normal. - Right atrium: The atrium was severely dilated. - Atrial septum: No defect or patent foramen ovale was identified. - Tricuspid valve: There was mild regurgitation. - Pulmonic valve: There was trivial regurgitation. - Pulmonary arteries: Systolic pressure was severely increased. PA   peak pressure: 59 mm Hg (S). - Inferior vena cava: The vessel was dilated. The respirophasic   diameter changes were blunted (< 50%), consistent with elevated   central venous pressure.  Impressions:  - Normal LV EF but severely elevated right sided filling pressures   consistent with pulmonary hypertension. No cardiac source of   embolism identified. Assessment & Plan   1.  Acute on chronic diastolic heart failure- She denies chest pain.  She still has slight lower extremity edema,  DOE, and SOB.  She has lost a total of 4.6 pounds since 11/03/2018 and weighs 186 pounds today.  She was 181.2 pounds in February 2020. Decrease Lasix to 20 mg daily Stop potassium supplementation Daily weights CMP drawn today  2.  Dyspnea on exertion-increased dyspnea with activities of daily living. Repeat echocardiogram  3.  Paroxysmal atrial fibrillation- sinus bradycardia with first-degree AV block today. Continue Eliquis 5 mg tablet twice daily  4.  Anemia- hemoglobin 9.7 on 11/05/2018.  No obvious signs of bleeding.  Dr. Cyndi Lennert office has provided stool sample kit.  I have recommended that she send this in a soon as possible CBC ordered today Instructions provided on  iron rich foods. Monitored by PCP  Follow-up with Fabian Sharp in 2 to 3 weeks.  Deberah Pelton, NP 11/11/2018, 9:39 AM

## 2018-11-16 DIAGNOSIS — R7989 Other specified abnormal findings of blood chemistry: Secondary | ICD-10-CM | POA: Diagnosis not present

## 2018-11-16 DIAGNOSIS — D649 Anemia, unspecified: Secondary | ICD-10-CM | POA: Diagnosis not present

## 2018-11-17 ENCOUNTER — Other Ambulatory Visit (HOSPITAL_COMMUNITY): Payer: Medicare Other

## 2018-11-17 ENCOUNTER — Other Ambulatory Visit: Payer: Self-pay

## 2018-11-17 DIAGNOSIS — D649 Anemia, unspecified: Secondary | ICD-10-CM

## 2018-11-17 DIAGNOSIS — R7989 Other specified abnormal findings of blood chemistry: Secondary | ICD-10-CM

## 2018-11-18 LAB — FECAL GLOBIN BY IMMUNOCHEMISTRY
FECAL GLOBIN RESULT:: DETECTED — AB
MICRO NUMBER:: 783573
SPECIMEN QUALITY:: ADEQUATE

## 2018-11-19 ENCOUNTER — Encounter: Payer: Self-pay | Admitting: Gastroenterology

## 2018-11-19 ENCOUNTER — Other Ambulatory Visit: Payer: Self-pay

## 2018-11-19 ENCOUNTER — Ambulatory Visit (HOSPITAL_COMMUNITY): Payer: Medicare Other | Attending: Cardiovascular Disease

## 2018-11-19 DIAGNOSIS — D649 Anemia, unspecified: Secondary | ICD-10-CM

## 2018-11-19 DIAGNOSIS — R195 Other fecal abnormalities: Secondary | ICD-10-CM

## 2018-11-19 DIAGNOSIS — R0609 Other forms of dyspnea: Secondary | ICD-10-CM | POA: Insufficient documentation

## 2018-11-20 NOTE — Progress Notes (Signed)
Carelink Summary Report / Loop Recorder 

## 2018-11-25 ENCOUNTER — Ambulatory Visit: Payer: Medicare Other | Admitting: Family Medicine

## 2018-12-02 ENCOUNTER — Ambulatory Visit: Payer: Medicare Other | Admitting: Physician Assistant

## 2018-12-02 NOTE — Progress Notes (Deleted)
Cardiology Clinic Note   Patient Name: Penny Miller Date of Encounter: 12/02/2018  Primary Care Provider:  [name], DO Primary Cardiologist:  Minus Breeding, MD  Patient Profile    Penny Miller 85 year old female presents today for follow-up for her lower extremity swelling and anemia.  Past Medical History    Past Medical History:  Diagnosis Date  . Allergic rhinitis, cause unspecified   . Anxiety state, unspecified   . Diaphragmatic hernia without mention of obstruction or gangrene   . Diverticulosis of colon (without mention of hemorrhage)   . Female stress incontinence   . Insomnia, unspecified   . Interstitial cystitis    surgery 02/01/2013  . Irritable bowel syndrome   . Memory loss   . Other and unspecified hyperlipidemia   . Other premature beats   . Polyneuropathy in diabetes(357.2)   . Postmenopausal bleeding   . Proteinuria   . Stroke ([place])   . 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 Unspecified essential hypertension   . Viral warts, unspecified    Past Surgical History:  Procedure Laterality Date  . ABDOMINAL HYSTERECTOMY  1977   Dr.Hambright  . APPENDECTOMY  1965  . CATARACT EXTRACTION, BILATERAL  02/10/2006   Dr.Groat   . COLONOSCOPY  07/17/2004   polypectomy  . [place]  . LOOP RECORDER INSERTION N/A 07/31/2016   Procedure: Loop Recorder Insertion;  Surgeon: [name], MD;  Location: [facility];  Service: Cardiovascular;  Laterality: N/A;  . TEE WITHOUT CARDIOVERSION N/A 07/30/2016   Procedure: TRANSESOPHAGEAL ECHOCARDIOGRAM (TEE);  Surgeon: [name], MD;  Location: [facility];  Service: Cardiovascular;  Laterality: N/A;  . [place]  . TRIGGER FINGER RELEASE  05/13/16  . urinary bladder      surgery 02/01/2013 to stretch bladder stem    Allergies  Allergies  Allergen Reactions  .  Jardiance [Empagliflozin] Diarrhea  . Metformin And Related Diarrhea  . Ceclor [Cefaclor] Other (See Comments)    Unknown reaction; not recalled  . Codeine Nausea And Vomiting  . Lopid [Gemfibrozil] Other (See Comments)    Unknown reaction; not recalled  . Metoprolol Nausea And Vomiting  . Morphine And Related Other (See Comments)    Passed out  . Niacin Nausea And Vomiting    Unknown reaction Unknown reaction; not recalled  . Niacin And Related Other (See Comments)    Unknown reaction; not recalled  . Other   . Relafen [Nabumetone] Nausea And Vomiting  . Septra [Sulfamethoxazole-Trimethoprim] Nausea And Vomiting  . Tetracycline Nausea And Vomiting  . Toprol Xl [Metoprolol Tartrate] Nausea And Vomiting  . Adhesive [Tape] Rash and Other (See Comments)    Tears the skin also    History of Present Illness    [name] was last seen by me on 11/11/2018.  During that time she stated that she was starting to feel better.  However, she still had complaints of low energy.  She also noted that she experience shortness of breath while doing normal daily activities like walking into her kitchen and hanging of her close.  Her lower extremity swelling had decreased and there was only slight bilateral nonpitting edema.  An echocardiogram on 11/19/2018 showed LVEF of 60 to 91% diastolic pseudonormalization, and moderately elevated right ventricular systolic pressure 69.4 mmHg.  Her left atrium was mild to moderately dilated, her  right atrium was mildly dilated and her pulmonary hypertension was moderate.  These observations were unchanged from her previous echocardiogram however, her LVEF was improved..  Her echocardiogram from 02/05/2018 showed an LVEF of 55 to 60% and pulmonary artery peak pressure at 59 mmHg.  Her PMH also includes essential hypertension, embolic stroke aneurysm of vertebral artery, paroxysmal atrial fibrillation, acute respiratory failure with hypoxia diabetes type 2, deaf, CKD stage  III, memory loss, hyperlipidemia, insomnia, dyspnea, overweight BMI 25-29.9, and major depression.  She presents the clinic today and states  She denies chest pain,increased lower extremity edema,  palpitations, melena, hematuria, hemoptysis, diaphoresis, weakness, presyncope, syncope, orthopnea, and PND.  Home Medications    Prior to Admission medications   Medication Sig Start Date End Date Taking? Authorizing Provider  ACCU-CHEK AVIVA PLUS test strip USE TO TEST BLOOD SUGAR TWICE DAILY. 09/11/18   Reed, [name] L, DO  albuterol (VENTOLIN HFA) 108 (90 Base) MCG/ACT inhaler Inhale 2 puffs into the lungs every 6 (six) hours as needed for wheezing or shortness of breath. 10/09/18   Reed, [name] L, DO  amLODipine (NORVASC) 2.5 MG tablet Take 1 tablet (2.5 mg total) by mouth daily. 06/30/18   Reed, [name] L, DO  atenolol (TENORMIN) 25 MG tablet TAKE ONE-HALF (1/2) TABLET DAILY 09/29/18   Reed, [name] L, DO  atorvastatin (LIPITOR) 40 MG tablet TAKE 1 TABLET DAILY AT 6 P.M. 10/05/18   Reed, [name] L, DO  citalopram (CELEXA) 10 MG tablet Take 1 tablet (10 mg total) by mouth daily. 06/29/18   Reed, [name] L, DO  ELIQUIS 5 MG TABS tablet TAKE 1 TABLET TWICE A DAY 09/29/18   Sherran Needs, NP  furosemide (LASIX) 20 MG tablet Take 20 mg by mouth daily.    [provider]  gabapentin (NEURONTIN) 300 MG capsule Take 1 capsule (300 mg total) by mouth 3 (three) times daily. 05/27/18   Lomax, Amy, NP  Lancets (ACCU-CHEK MULTICLIX) lancets Use to test blood sugar twice daily. E11.29 07/04/17   Reed, [name] L, DO  pioglitazone (ACTOS) 15 MG tablet TAKE 1 TABLET DAILY 09/29/18   Reed, [name] L, DO  Vitamin D, Ergocalciferol, (DRISDOL) 1.25 MG (50000 UT) CAPS capsule TAKE 1 CAPSULE ONCE WEEKLY FOR VITAMIN D 07/20/18   [name], DO    Family History    Family History  Problem Relation Age of Onset  . Cancer Mother        pancreatic  . Cancer Father        bladder  . Heart attack Father   .  Hypertension Sister   . Diabetes Sister   . Diabetes Brother   . Kidney cancer Brother   . Hypertension Brother   . Diabetes Brother   . Heart disease Brother 57       Myocardial Infarction    She indicated that her mother is deceased. She indicated that her father is deceased. She indicated that her sister is alive. She indicated that all of her six brothers are deceased. She indicated that her maternal grandmother is deceased. She indicated that her maternal grandfather is deceased. She indicated that her paternal grandmother is deceased. She indicated that her paternal grandfather is deceased. She indicated that her son is alive.  Social History    Social History   Socioeconomic History  . Marital status: Widowed    Spouse name: Not on file  . Number of children: 1  . Years of education: Not on file  .  Highest education level: Not on file  Occupational History  . 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 Financial resource strain: Not hard at all  . Food insecurity    Worry: Never true    Inability: Never true  . Transportation needs    Medical: No    Non-medical: No  Tobacco Use  . Smoking status: Former Smoker    Packs/day: 0.25    Years: 62.00    Pack years: 15.50    Quit date: 04/02/2011    Years since quitting: 7.6  . Smokeless tobacco: Never Used  . Tobacco comment: stopped consistently smoking in 2012 but still smokes occasionally (one pack per week)  Substance and Sexual Activity  . Alcohol use: No  . Drug use: No  . Sexual activity: Never  Lifestyle  . Physical activity    Days per week: 7 days    Minutes per session: 10 min  . Stress: Not at all  Relationships  . Social connections    Talks on phone: More than three times a week    Gets together: More than three times a week    Attends religious service: Never    Active member of club or organization: No    Attends meetings of clubs or organizations: Never    Relationship  status: Widowed  . Intimate partner violence    Fear of current or ex partner: No    Emotionally abused: No    Physically abused: No    Forced sexual activity: No  Other Topics Concern  . Not on file  Social History Narrative   Ophthalmologist-Dr.Gould and Dr.Groat   Podiatrist- Dr.Tuchman   Dermatologist- Dr.Lupton   Urologist- Dr. Lawerance Bach    Lives with her son.      Review of Systems    General:  No chills, fever, night sweats or weight changes.  Cardiovascular:  No chest pain, dyspnea on exertion, edema, orthopnea, palpitations, paroxysmal nocturnal dyspnea. Dermatological: No rash, lesions/masses Respiratory: No cough, dyspnea Urologic: No hematuria, dysuria Abdominal:   No nausea, vomiting, diarrhea, bright red blood per rectum, melena, or hematemesis Neurologic:  No visual changes, wkns, changes in mental status. All other systems reviewed and are otherwise negative except as noted above.  Physical Exam    VS:  There were no vitals taken for this visit. , BMI There is no height or weight on file to calculate BMI. GEN: Well nourished, well developed, in no acute distress. HEENT: normal. Neck: Supple, no JVD, carotid bruits, or masses. Cardiac: RRR, no murmurs, rubs, or gallops. No clubbing, cyanosis, edema.  Radials/DP/PT 2+ and equal bilaterally.  Respiratory:  Respirations regular and unlabored, clear to auscultation bilaterally. GI: Soft, nontender, nondistended, BS + x 4. MS: no deformity or atrophy. Skin: warm and dry, no rash. Neuro:  Strength and sensation are intact. Psych: Normal affect.  Accessory Clinical Findings    ECG personally reviewed by me today-none today  EKG 11/11/2018  sinus bradycardia with first-degree AV block 54 bpm  EKG 02/01/2018: Sinus rhythm 75 bpm  Echocardiogram 02/05/2018: Study Conclusions  - Left ventricle: The cavity size was normal. There was moderate concentric hypertrophy. Systolic function was normal. The  estimated ejection fraction was in the range of 55% to 60%. Wall motion was normal; there were no regional wall motion abnormalities. The study was not technically sufficient to allow evaluation of LV diastolic dysfunction due to atrial fibrillation. - Aortic valve: Moderately calcified annulus.  Trileaflet; mildly thickened leaflets. There was very mild stenosis. Mean gradient (S): 6 mm Hg. - Aorta: The aorta was moderately calcified. - Mitral valve: Calcified annulus. There was mild regurgitation. - Left atrium: The atrium was mildly to moderately dilated. - Right ventricle: The cavity size was mildly dilated. Wall thickness was normal. Systolic function was low normal. - Right atrium: The atrium was severely dilated. - Atrial septum: No defect or patent foramen ovale was identified. - Tricuspid valve: There was mild regurgitation. - Pulmonic valve: There was trivial regurgitation. - Pulmonary arteries: Systolic pressure was severely increased. PA peak pressure: 59 mm Hg (S). - Inferior vena cava: The vessel was dilated. The respirophasic diameter changes were blunted (<50%), consistent with elevated central venous pressure.  Impressions:  - Normal LV EF but severely elevated right sided filling pressures consistent with pulmonary hypertension. No cardiac source of embolism identified.  Echocardiogram 11/19/2018 IMPRESSIONS   1. The left ventricle has normal systolic function with an ejection fraction of 60-65%. The cavity size was normal. Left ventricular diastolic Doppler parameters are consistent with pseudonormalization.  2. The right ventricle has normal systolic function. The cavity was normal. There is no increase in right ventricular wall thickness. Right ventricular systolic pressure is moderately elevated with an estimated pressure of 39.4 mmHg.  3. Left atrial size was mild-moderately dilated.  4. Right atrial size was mildly dilated.  5.  No evidence of mitral valve stenosis.  6. The tricuspid valve is grossly normal.  7. No stenosis of the aortic valve.  8. The aorta is normal unless otherwise noted.  9. The aortic root and ascending aorta are normal in size and structure. 10. Pulmonary hypertension is moderate. 11. The atrial septum is grossly normal. Assessment & Plan   1.  Acute on chronic diastolic heart failure- She denies chest pain.  She still has slight lower extremity edema, DOE, and SOB.  weighs 186 pounds today.  She was 181.2 pounds in February 2020.  Echocardiogram 11/19/2018 showed LVEF 60 to 65% Continue Lasix 20 mg daily Stop potassium supplementation Daily weights   2.  Dyspnea on exertion-Stable dyspnea with activities of daily living.    3.  Paroxysmal atrial fibrillation- No EKG today. Continue Eliquis 5 mg tablet twice daily  4.  Anemia- hemoglobin 10.4 on 11/11/2018 and 9.7on 11/05/2018.  No obvious signs of bleeding.  Dr. Cyndi Lennert office has provided stool sample kit.  Instructions provided on  iron rich foods. Monitored by PCP  Deberah Pelton, NP-C 12/02/2018, 9:39 AM

## 2018-12-03 ENCOUNTER — Telehealth: Payer: Self-pay

## 2018-12-03 ENCOUNTER — Encounter: Payer: Self-pay | Admitting: Gastroenterology

## 2018-12-03 ENCOUNTER — Ambulatory Visit (INDEPENDENT_AMBULATORY_CARE_PROVIDER_SITE_OTHER): Payer: Medicare Other | Admitting: Gastroenterology

## 2018-12-03 VITALS — BP 116/50 | HR 56 | Temp 98.2°F | Ht 65.75 in | Wt 191.1 lb

## 2018-12-03 DIAGNOSIS — Z7901 Long term (current) use of anticoagulants: Secondary | ICD-10-CM | POA: Diagnosis not present

## 2018-12-03 DIAGNOSIS — K219 Gastro-esophageal reflux disease without esophagitis: Secondary | ICD-10-CM | POA: Diagnosis not present

## 2018-12-03 DIAGNOSIS — R195 Other fecal abnormalities: Secondary | ICD-10-CM | POA: Diagnosis not present

## 2018-12-03 DIAGNOSIS — D509 Iron deficiency anemia, unspecified: Secondary | ICD-10-CM

## 2018-12-03 DIAGNOSIS — I639 Cerebral infarction, unspecified: Secondary | ICD-10-CM | POA: Diagnosis not present

## 2018-12-03 DIAGNOSIS — R151 Fecal smearing: Secondary | ICD-10-CM

## 2018-12-03 MED ORDER — OMEPRAZOLE 20 MG PO CPDR: 20.0000 mg | DELAYED_RELEASE_CAPSULE | Freq: Every day | ORAL | 1 refills | Status: DC

## 2018-12-03 MED ORDER — FERROUS SULFATE 325 (65 FE) MG PO TBEC: 325.0000 mg | DELAYED_RELEASE_TABLET | Freq: Two times a day (BID) | ORAL | 5 refills | Status: DC

## 2018-12-03 MED ORDER — SUPREP BOWEL PREP KIT 17.5-3.13-1.6 GM/177ML PO SOLN: ORAL | 0 refills | Status: DC

## 2018-12-03 NOTE — Telephone Encounter (Signed)
Patient with diagnosis of afib on Eliquis for anticoagulation.    Procedure:   COLONOSCOPY/EGD Date of procedure: 12/16/2018  CHADS2-VASc score of  8 (CHF, HTN, AGE, DM2, stroke/tia x 2, AGE, female)  CrCl 27 ml/min  Patient is high risk off anticoagulation due to prior stroke. Would prefer per office protocol that patient hold Eliquis for only 1.5 days prior to procedure.

## 2018-12-03 NOTE — Progress Notes (Signed)
HPI :  85 y/o female with a history of CVA, atrial fibrillation, CHF, DM, HTN, anticoagulated with Eliquis, referred here by Sherrie Mustache NP for anemia.  Patient referred for iron deficiency anemia. On 11/02/18 she had labs consistent with severe iron deficiency. Her Hgb has ranged between 9-10s for the past 1 month. Earlier this year her Hgb was 11s. She had a heme positive stool recently. GFR 34.  She denies any problems with her bowels.  She reports passing normal brown stools.  No melena, no hematochezia.  She denies any family history of colon cancer.  Her last colonoscopy she thinks was between 10 and 20 years ago.  She reports a remote history of colitis, denies any problems with that recently.  She does have some leakage of stool at times, and also has some urinary incontinence.  She tends to have a bowel movement which each time she urinates.  She denies any weight loss, endorses weight gain.  She denies any abdominal pains.  She has had occasional nausea and a poor appetite.  She does have some heartburn that bothers her periodically. No dysphagia. She denies any NSAID use.  She is never had a prior upper endoscopy.  She has been using Tylenol as needed for pains.  She has not been placed on iron supplementation yet.  Recent labs as below.    Ref Range & Units 3wk ago 4wk ago 49mo ago   WBC 3.4 - 10.8 x10E3/uL 10.5  8.9 R  7.7 R   RBC 3.77 - 5.28 x10E6/uL 3.96  3.64Low  R  3.50Low  R   Hemoglobin 11.1 - 15.9 g/dL 10.4Low   9.7Low  R  9.5Low  R   Hematocrit 34.0 - 46.6 % 34.2  31.5Low  R  30.1Low  R   MCV 79 - 97 fL 86  86.5 R  86.0 R   MCH 26.6 - 33.0 pg 26.3Low   26.6Low  R  27.1 R   MCHC 31.5 - 35.7 g/dL 30.4Low   30.8Low  R  31.6Low  R   RDW 11.7 - 15.4 % 14.9  15.2High  R  15.1High  R   Platelets 150 - 450 x10E3/uL 269  255 R  226 R     Lab Results  Component Value Date   IRON 33 (L) 11/02/2018   TIBC 456 (H) 11/02/2018   FERRITIN 13 (L) 11/02/2018      Echo 11/19/2018  - EF 60-65%, moderate pulmonary HTN  Past Medical History:  Diagnosis Date  . Allergic rhinitis, cause unspecified   . Anxiety state, unspecified   . Arthritis   . Colon polyp   . Diaphragmatic hernia without mention of obstruction or gangrene   . Diverticulosis of colon (without mention of hemorrhage)   . Female stress incontinence   . HTN (hypertension)   . Insomnia, unspecified   . Interstitial cystitis    surgery 02/01/2013  . Irritable bowel syndrome   . Memory loss   . Other and unspecified hyperlipidemia   . Other premature beats   . Polyneuropathy in diabetes(357.2)   . Postmenopausal bleeding   . Proteinuria   . Rectal ulcer   . Skin cancer   . Stroke ([place])   . 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 Unspecified essential hypertension   . Viral warts, unspecified      Past Surgical History:  Procedure Laterality Date  . ABDOMINAL  HYSTERECTOMY  1977   Dr.Hambright  . APPENDECTOMY  1965  . CATARACT EXTRACTION, BILATERAL  02/10/2006   Dr.Groat   . COLONOSCOPY  07/17/2004   polypectomy  . [place]  . LOOP RECORDER INSERTION N/A 07/31/2016   Procedure: Loop Recorder Insertion;  Surgeon: [name], MD;  Location: [facility];  Service: Cardiovascular;  Laterality: N/A;  . TEE WITHOUT CARDIOVERSION N/A 07/30/2016   Procedure: TRANSESOPHAGEAL ECHOCARDIOGRAM (TEE);  Surgeon: [name], MD;  Location: [facility];  Service: Cardiovascular;  Laterality: N/A;  . [place]  . TRIGGER FINGER RELEASE  05/13/16  . urinary bladder      surgery 02/01/2013 to stretch bladder stem   Family History  Problem Relation Age of Onset  . Pancreatic cancer Mother 46  . Heart attack Father   . Bladder Cancer Father   . Hypertension Sister   . Diabetes Sister   . Diabetes Brother   . Kidney cancer Brother   . Hypertension Brother   .  Diabetes Brother   . Heart disease Brother 26       Myocardial Infarction    Social History   Tobacco Use  . Smoking status: Former Smoker    Packs/day: 0.25    Years: 62.00    Pack years: 15.50    Quit date: 04/02/2011    Years since quitting: 7.6  . Smokeless tobacco: Never Used  . Tobacco comment: stopped consistently smoking in 2012 but still smokes occasionally (one pack per week)  Substance Use Topics  . Alcohol use: No  . Drug use: No   Current Outpatient Medications  Medication Sig Dispense Refill  . ACCU-CHEK AVIVA PLUS test strip USE TO TEST BLOOD SUGAR TWICE DAILY. 100 each 11  . albuterol (VENTOLIN HFA) 108 (90 Base) MCG/ACT inhaler Inhale 2 puffs into the lungs every 6 (six) hours as needed for wheezing or shortness of breath. 18 g 0  . amLODipine (NORVASC) 2.5 MG tablet Take 1 tablet (2.5 mg total) by mouth daily. 180 tablet 1  . atenolol (TENORMIN) 25 MG tablet TAKE ONE-HALF (1/2) TABLET DAILY 45 tablet 3  . atorvastatin (LIPITOR) 40 MG tablet TAKE 1 TABLET DAILY AT 6 P.M. 90 tablet 3  . citalopram (CELEXA) 10 MG tablet Take 1 tablet (10 mg total) by mouth daily. 90 tablet 1  . ELIQUIS 5 MG TABS tablet TAKE 1 TABLET TWICE A DAY 180 tablet 3  . furosemide (LASIX) 20 MG tablet Take 20 mg by mouth daily.    Marland Kitchen gabapentin (NEURONTIN) 300 MG capsule Take 1 capsule (300 mg total) by mouth 3 (three) times daily. 270 capsule 3  . Lancets (ACCU-CHEK MULTICLIX) lancets Use to test blood sugar twice daily. E11.29 100 each 11  . pioglitazone (ACTOS) 15 MG tablet TAKE 1 TABLET DAILY 90 tablet 3  . Vitamin D, Ergocalciferol, (DRISDOL) 1.25 MG (50000 UT) CAPS capsule TAKE 1 CAPSULE ONCE WEEKLY FOR VITAMIN D 12 capsule 3   No current facility-administered medications for this visit.    Allergies  Allergen Reactions  . Jardiance [Empagliflozin] Diarrhea  . Metformin And Related Diarrhea  . Ceclor [Cefaclor] Other (See Comments)    Unknown reaction; not recalled  . Codeine Nausea  And Vomiting  . Lopid [Gemfibrozil] Other (See Comments)    Unknown reaction; not recalled  . Metoprolol Nausea And Vomiting  . Morphine And Related Other (See Comments)    Passed  out  . Niacin Nausea And Vomiting    Unknown reaction Unknown reaction; not recalled  . Niacin And Related Other (See Comments)    Unknown reaction; not recalled  . Other   . Relafen [Nabumetone] Nausea And Vomiting  . Septra [Sulfamethoxazole-Trimethoprim] Nausea And Vomiting  . Tetracycline Nausea And Vomiting  . Toprol Xl [Metoprolol Tartrate] Nausea And Vomiting  . Adhesive [Tape] Rash and Other (See Comments)    Tears the skin also     Review of Systems: All systems reviewed and negative except where noted in HPI.   CBC Latest Ref Rng & Units 11/11/2018 11/05/2018 11/02/2018  WBC 3.4 - 10.8 x10E3/uL 10.5 8.9 7.7  Hemoglobin 11.1 - 15.9 g/dL 10.4(L) 9.7(L) 9.5(L)  Hematocrit 34.0 - 46.6 % 34.2 31.5(L) 30.1(L)  Platelets 150 - 450 x10E3/uL 269 255 226    Lab Results  Component Value Date   CREATININE 1.61 (H) 11/11/2018   BUN 25 11/11/2018   NA 136 11/11/2018   K 4.9 11/11/2018   CL 99 11/11/2018   CO2 21 11/11/2018    Lab Results  Component Value Date   ALT 15 11/02/2018   AST 14 11/02/2018   ALKPHOS 74 02/01/2018   BILITOT 0.4 11/02/2018    Lab Results  Component Value Date   IRON 33 (L) 11/02/2018   TIBC 456 (H) 11/02/2018   FERRITIN 13 (L) 11/02/2018     Physical Exam: BP (!) 116/50 (BP Location: Left Arm, Patient Position: Sitting, Cuff Size: Normal)   Pulse (!) 56   Temp 98.2 F (36.8 C)   Ht 5' 5.75" (1.67 m) Comment: height measured without shoes  Wt 191 lb 2 oz (86.7 kg)   BMI 31.08 kg/m  Constitutional: Pleasant,well-developed, female in no acute distress. HEENT: Normocephalic and atraumatic. Conjunctivae are normal. No scleral icterus. Neck supple.  Cardiovascular: Normal rate, regular rhythm.  Pulmonary/chest: Effort normal and breath sounds normal. No  wheezing, rales or rhonchi. Abdominal: Soft, nondistended, nontender.. There are no masses palpable. No hepatomegaly. Extremities: mild LE edema Lymphadenopathy: No cervical adenopathy noted. Neurological: Alert and oriented to person place and time. Skin: Skin is warm and dry. No rashes noted. Psychiatric: Normal mood and affect. Behavior is normal.   ASSESSMENT AND PLAN: 85 year old female here for new patient assessment of the following issues:  Iron deficiency anemia / Heme positive stool / Anticoagulated - patient presenting with iron deficiency anemia in the setting of Eliquis use, she is heme positive stool but no overt GI bleeding.  She has had some decreased appetite and nausea with some ongoing reflux, otherwise no lower tract symptoms.  I discussed differential diagnosis for her iron deficiency anemia.  I am recommending an EGD and colonoscopy to further evaluate this issue as she has history of stroke and requirement for long-term anticoagulation.  I discussed with these procedures would entail, risks and benefits of each as well as that of anesthesia, she wanted to proceed.  In the interim we will place her on omeprazole 20 mg once a day to treat her reflux and empirically cover for PUD.  We will need to hold her Eliquis for 3 days prior to endoscopy and will ask for cardiology approval to do that.  She has otherwise not been started on iron replacement therapy yet, will start her on ferrous sulfate twice a day.  I counseled her that this can make her stools dark.  She should continue to avoid all NSAIDs.  Further recommendations pending results.  She  agreed.  GERD - as above we will treat with omeprazole 20 mg once daily in light of her anemia and anticoagulation  Fecal leakage - both urinary incontinence and fecal leakage, I suspect she will benefit from pelvic floor PT.  We will await colonoscopy results first.  She agreed  Woodbury Cellar, MD [facility]  CC:  Lauree Chandler, NP

## 2018-12-03 NOTE — Telephone Encounter (Signed)
error 

## 2018-12-03 NOTE — Telephone Encounter (Signed)
[facility] Pre-operative Risk Assessment     Request for surgical clearance:     Endoscopy Procedure  What type of surgery is being performed?     COLONOSCOPY/EGD  When is this surgery scheduled?   12-16-18  What type of clearance is required ?   Pharmacy  Are there any medications that need to be held prior to surgery and how long? ELIQUIS 3 DAYS  Practice name and name of physician performing surgery?  [facility]  What is your office phone and fax number?      Phone- [phone]  Fax- [phone] Attn: Lemar Lofty, CMA  Anesthesia type (None, local, MAC, general) ?       MAC  Thank you

## 2018-12-03 NOTE — Telephone Encounter (Signed)
By review of notes it looks like the patient has been dealing with some volume overload and was to have a follow-up appointment yesterday 9/2 but I do not see a note for that appointment.  I attempted to call the patient to update her current clinical status and see if she was doing better, no answer, left voicemail for her to call back and ask for preop clinic.  I will route this message to preop pharmacy for input on holding anticoagulation.

## 2018-12-03 NOTE — Patient Instructions (Addendum)
Normal BMI (Body Mass Index- based on height and weight) is between 23 and 30. Your BMI today is Body mass index is 31.08 kg/m. Marland Kitchen Please consider follow up  regarding your BMI with your Primary Care Provider.  To help prevent the possible spread of infection to our patients, communities, and staff; we will be implementing the following measures:  As of now we are not allowing any visitors/family members to accompany you to any upcoming appointments with [facility]. If you have any concerns about this please contact our office to discuss prior to the appointment.   You have been scheduled for a colonoscopy. Please follow written instructions given to you at your visit today.  Please pick up your prep supplies at the pharmacy within the next 1-3 days. If you use inhalers (even only as needed), please bring them with you on the day of your procedure. Your physician has requested that you go to www.startemmi.com and enter the access code given to you at your visit today. This web site gives a general overview about your procedure. However, you should still follow specific instructions given to you by our office regarding your preparation for the procedure.  We have sent the following medications to your pharmacy for you to pick up at your convenience: Ferrous sulfate 325mg : Take twice a day Omeprazole 20mg : Take once a day  You will be contacted by our office prior to your procedure for directions on holding your Eliquise.  If you do not hear from our office 1 week prior to your scheduled procedure, please call [phone] to discuss.   Thank you for entrusting me with your care and for choosing [facility], Dr. Ruthville Cellar

## 2018-12-08 NOTE — Telephone Encounter (Signed)
Dr. Percival Spanish, can you please comment on request to hold Eliquis for 3 days due to poor renal function? Originally recommended 1.5 days per protocol. MD requesting 3 days. Patient is high risk off anticoagulation due to history of stroke.

## 2018-12-08 NOTE — Telephone Encounter (Signed)
Please see Dr. Doyne Keel note regarding holding Eliquis for 3 days (as opposed to just 1.5 days as indicated) prior to Colonoscopy/EGD scheduled for 12-16-18. Thank you.

## 2018-12-08 NOTE — Telephone Encounter (Signed)
Jan can you please clarify for them we are asking for 3 day hold due to her poor clearance given underlying renal function and risk for bleeding with the procedures. She can take aspirin during the time of the exam if that would help but need to hold it for 3 days for the bleeding risk or I can't remove any polyps, etc. Thanks

## 2018-12-09 NOTE — Telephone Encounter (Signed)
Called and LM for pt that Dr. Percival Spanish has given clearance for her to hold Eliquis for 3 days prior to procedure. Called and spoke to granddaughter, [name] who helps her with her medical issues.  Gave her the information about pt holding Eliquis starting on 9-13 for the 9-16 procedure. Instructed her to have her take an aspirin while she is holding.  Asked her to have pt call me back to confirm understanding.

## 2018-12-09 NOTE — Telephone Encounter (Signed)
OK to hold anticoagulation for 3 days.

## 2018-12-10 NOTE — Telephone Encounter (Signed)
Called and spoke to pt.  She confirmed understanding and wrote the dates down to hold her Eliquis.  She also will speak with her granddaughter Penny Miller since she was given the information as well.

## 2018-12-11 ENCOUNTER — Telehealth: Payer: Self-pay

## 2018-12-11 NOTE — Telephone Encounter (Signed)
Left message for patient to regarding disconnected monitor 

## 2018-12-14 ENCOUNTER — Encounter: Payer: Medicare Other | Admitting: *Deleted

## 2018-12-14 ENCOUNTER — Telehealth: Payer: Self-pay | Admitting: Gastroenterology

## 2018-12-14 LAB — CUP PACEART REMOTE DEVICE CHECK
Date Time Interrogation Session: 20200913184049
Implantable Pulse Generator Implant Date: 20180502

## 2018-12-14 NOTE — Telephone Encounter (Signed)
Patient reports that she has pain with taking the iron supplement.  Abdominal pain after taking since Friday. She will hold it until the Endo/colon and then discuss with him after the procedure.

## 2018-12-15 ENCOUNTER — Telehealth: Payer: Self-pay

## 2018-12-15 ENCOUNTER — Telehealth: Payer: Self-pay | Admitting: Gastroenterology

## 2018-12-15 NOTE — Telephone Encounter (Signed)
Covid-19 screening questions   Do you now or have you had a fever in the last 14 days? NO   Do you have any respiratory symptoms of shortness of breath or cough now or in the last 14 days? NO  Do you have any family members or close contacts with diagnosed or suspected Covid-19 in the past 14 days? NO  Have you been tested for Covid-19 and found to be positive? NO        

## 2018-12-15 NOTE — Telephone Encounter (Signed)
Patient's son Kamika Goodloe) called. Patient getting ready for EGD/colonoscopy for tomorrow Tolerating prep okay  Her heart rate did go down to 44/min, but has come up to 59/min now.  Always had bradycardia.  She is also on beta-blockers (in clinic was in mid 37s).  No shortness of breath or chest pains.  She has been cleared by cardiology. Last echo 10/2018-normal EF, did have pulmonary artery hypertension.  Also has Paceart device after CVA.  Last check was on 9/13   Doing well currently Eliquis is on hold Tolerating prep okay  I think she will be fine for EGD/colonoscopy for tomorrow. May need cardiology re-eval as an outpatient  I have told son to call me directly on cell phone if there are any problems overnight.   RG

## 2018-12-16 ENCOUNTER — Ambulatory Visit (AMBULATORY_SURGERY_CENTER): Payer: Medicare Other | Admitting: Gastroenterology

## 2018-12-16 ENCOUNTER — Encounter: Payer: Self-pay | Admitting: Gastroenterology

## 2018-12-16 ENCOUNTER — Other Ambulatory Visit: Payer: Self-pay | Admitting: Gastroenterology

## 2018-12-16 ENCOUNTER — Other Ambulatory Visit (INDEPENDENT_AMBULATORY_CARE_PROVIDER_SITE_OTHER): Payer: Medicare Other

## 2018-12-16 ENCOUNTER — Other Ambulatory Visit: Payer: Self-pay

## 2018-12-16 VITALS — BP 155/83 | HR 62 | Temp 98.8°F | Resp 15 | Ht 65.0 in | Wt 191.0 lb

## 2018-12-16 DIAGNOSIS — D509 Iron deficiency anemia, unspecified: Secondary | ICD-10-CM | POA: Diagnosis not present

## 2018-12-16 DIAGNOSIS — D124 Benign neoplasm of descending colon: Secondary | ICD-10-CM

## 2018-12-16 DIAGNOSIS — K2951 Unspecified chronic gastritis with bleeding: Secondary | ICD-10-CM | POA: Diagnosis not present

## 2018-12-16 DIAGNOSIS — D127 Benign neoplasm of rectosigmoid junction: Secondary | ICD-10-CM | POA: Diagnosis not present

## 2018-12-16 DIAGNOSIS — K5730 Diverticulosis of large intestine without perforation or abscess without bleeding: Secondary | ICD-10-CM

## 2018-12-16 DIAGNOSIS — K598 Other specified functional intestinal disorders: Secondary | ICD-10-CM | POA: Diagnosis not present

## 2018-12-16 DIAGNOSIS — K529 Noninfective gastroenteritis and colitis, unspecified: Secondary | ICD-10-CM

## 2018-12-16 DIAGNOSIS — K5520 Angiodysplasia of colon without hemorrhage: Secondary | ICD-10-CM | POA: Diagnosis not present

## 2018-12-16 DIAGNOSIS — K3189 Other diseases of stomach and duodenum: Secondary | ICD-10-CM

## 2018-12-16 DIAGNOSIS — D508 Other iron deficiency anemias: Secondary | ICD-10-CM

## 2018-12-16 DIAGNOSIS — I4891 Unspecified atrial fibrillation: Secondary | ICD-10-CM | POA: Diagnosis not present

## 2018-12-16 DIAGNOSIS — K2970 Gastritis, unspecified, without bleeding: Secondary | ICD-10-CM | POA: Diagnosis not present

## 2018-12-16 DIAGNOSIS — K229 Disease of esophagus, unspecified: Secondary | ICD-10-CM

## 2018-12-16 DIAGNOSIS — K5710 Diverticulosis of small intestine without perforation or abscess without bleeding: Secondary | ICD-10-CM

## 2018-12-16 DIAGNOSIS — R195 Other fecal abnormalities: Secondary | ICD-10-CM

## 2018-12-16 LAB — CBC WITH DIFFERENTIAL/PLATELET
Basophils Absolute: 0 10*3/uL (ref 0.0–0.1)
Basophils Relative: 0.5 % (ref 0.0–3.0)
Eosinophils Absolute: 0.1 10*3/uL (ref 0.0–0.7)
Eosinophils Relative: 1.7 % (ref 0.0–5.0)
HCT: 30.7 % — ABNORMAL LOW (ref 36.0–46.0)
Hemoglobin: 9.8 g/dL — ABNORMAL LOW (ref 12.0–15.0)
Lymphocytes Relative: 11.7 % — ABNORMAL LOW (ref 12.0–46.0)
Lymphs Abs: 0.9 10*3/uL (ref 0.7–4.0)
MCHC: 31.9 g/dL (ref 30.0–36.0)
MCV: 83.9 fl (ref 78.0–100.0)
Monocytes Absolute: 0.8 10*3/uL (ref 0.1–1.0)
Monocytes Relative: 9.8 % (ref 3.0–12.0)
Neutro Abs: 5.8 10*3/uL (ref 1.4–7.7)
Neutrophils Relative %: 76.3 % (ref 43.0–77.0)
Platelets: 189 10*3/uL (ref 150.0–400.0)
RBC: 3.66 Mil/uL — ABNORMAL LOW (ref 3.87–5.11)
RDW: 18.7 % — ABNORMAL HIGH (ref 11.5–15.5)
WBC: 7.7 10*3/uL (ref 4.0–10.5)

## 2018-12-16 MED ORDER — OMEPRAZOLE 20 MG PO CPDR: 20.0000 mg | DELAYED_RELEASE_CAPSULE | Freq: Two times a day (BID) | ORAL | 3 refills | Status: DC

## 2018-12-16 MED ORDER — SODIUM CHLORIDE 0.9 % IV SOLN: 500.0000 mL | Freq: Once | INTRAVENOUS | Status: DC

## 2018-12-16 NOTE — Telephone Encounter (Signed)
Thanks much Merrie Roof, appreciate you looking into her case.

## 2018-12-16 NOTE — Progress Notes (Signed)
Called to room to assist during endoscopic procedure.  Patient ID and intended procedure confirmed with present staff. Received instructions for my participation in the procedure from the performing physician.  

## 2018-12-16 NOTE — Op Note (Signed)
[facility] Patient Name: Penny Miller Procedure Date: 12/16/2018 3:22 PM MRN: [id number] Endoscopist: Remo Lipps P. Havery Moros , MD Age: 85 Referring MD:  Date of Birth: Jun 18, 1931 Gender: Female Account #: [id number] Procedure:                Upper GI endoscopy Indications:              Iron deficiency anemia Medicines:                Monitored Anesthesia Care Procedure:                Pre-Anesthesia Assessment:                           - Prior to the procedure, a History and Physical                            was performed, and patient medications and                            allergies were reviewed. The patient's tolerance of                            previous anesthesia was also reviewed. The risks                            and benefits of the procedure and the sedation                            options and risks were discussed with the patient.                            All questions were answered, and informed consent                            was obtained. Prior Anticoagulants: The patient has                            taken Eliquis (apixaban), last dose was 3 days                            prior to procedure. ASA Grade Assessment: III - A                            patient with severe systemic disease. After                            reviewing the risks and benefits, the patient was                            deemed in satisfactory condition to undergo the                            procedure.  After obtaining informed consent, the endoscope was                            passed under direct vision. Throughout the                            procedure, the patient's blood pressure, pulse, and                            oxygen saturations were monitored continuously. The                            Endoscope was introduced through the mouth, and                            advanced to the second part of duodenum. The upper                GI endoscopy was accomplished without difficulty.                            The patient tolerated the procedure well. Scope In: Scope Out: Findings:                 Esophagogastric landmarks were identified: the                            Z-line was found at 40 cm, the gastroesophageal                            junction was found at 40 cm and the upper extent of                            the gastric folds was found at 40 cm from the                            incisors.                           Localized mucosal change, around 80mm in diameter,                            were found in the middle third of the esophagus                            around 26cm of the incisors, benign appearing.                            Biopsies were taken with a cold forceps for                            histology.                           The exam of the esophagus was otherwise normal.  Patchy mild inflammation characterized by adherent                            blood and erythema was found in the gastric body                            and in the gastric antrum, without focal                            ulceration. Biopsies were taken with a cold forceps                            for histology and Helicobacter pylori testing.                           The exam of the stomach was otherwise normal.                           A large diverticulum was found in the second                            portion of the duodenum.                           The exam of the duodenum was otherwise normal. Complications:            No immediate complications. Estimated blood loss:                            Minimal. Estimated Blood Loss:     Estimated blood loss was minimal. Impression:               - Esophagogastric landmarks identified.                           - Mucosal changes in the esophagus as outlined,                            benign appearing. Biopsied.                            - Normal esophagus                           - Mild gastritis. Biopsied.                           - Normal stomach otherwise.                           - Duodenal diverticulum.                           - Normal duodenum otherwise. Recommendation:           - Patient has a contact number available for  emergencies. The signs and symptoms of potential                            delayed complications were discussed with the                            patient. Return to normal activities tomorrow.                            Written discharge instructions were provided to the                            patient.                           - Resume previous diet.                           - Continue present medications. Resume Eliquis                            tomorrow per colonoscopy note                           - Increase omeprazole to 20mg  twice daily given                            gastritis on this exam                           - Await pathology results. Remo Lipps P. Royce Sciara, MD 12/16/2018 4:54:18 PM This report has been signed electronically.

## 2018-12-16 NOTE — Patient Instructions (Signed)
Please read handouts provided. Continue present medications. Resume Eliquis tomorrow afternoon (24 hours). Await pathology results. Increase omeprazole to 20 mg twice daily.       YOU HAD AN ENDOSCOPIC PROCEDURE TODAY AT [facility]:   Refer to the procedure report that was given to you for any specific questions about what was found during the examination.  If the procedure report does not answer your questions, please call your gastroenterologist to clarify.  If you requested that your care partner not be given the details of your procedure findings, then the procedure report has been included in a sealed envelope for you to review at your convenience later.  YOU SHOULD EXPECT: Some feelings of bloating in the abdomen. Passage of more gas than usual.  Walking can help get rid of the air that was put into your GI tract during the procedure and reduce the bloating. If you had a lower endoscopy (such as a colonoscopy or flexible sigmoidoscopy) you may notice spotting of blood in your stool or on the toilet paper. If you underwent a bowel prep for your procedure, you may not have a normal bowel movement for a few days.  Please Note:  You might notice some irritation and congestion in your nose or some drainage.  This is from the oxygen used during your procedure.  There is no need for concern and it should clear up in a day or so.  SYMPTOMS TO REPORT IMMEDIATELY:   Following lower endoscopy (colonoscopy or flexible sigmoidoscopy):  Excessive amounts of blood in the stool  Significant tenderness or worsening of abdominal pains  Swelling of the abdomen that is new, acute  Fever of 100F or higher   Following upper endoscopy (EGD)  Vomiting of blood or coffee ground material  New chest pain or pain under the shoulder blades  Painful or persistently difficult swallowing  New shortness of breath  Fever of 100F or higher  Black, tarry-looking stools  For urgent or  emergent issues, a gastroenterologist can be reached at any hour by calling [phone].   DIET:  We do recommend a small meal at first, but then you may proceed to your regular diet.  Drink plenty of fluids but you should avoid alcoholic beverages for 24 hours.  ACTIVITY:  You should plan to take it easy for the rest of today and you should NOT DRIVE or use heavy machinery until tomorrow (because of the sedation medicines used during the test).    FOLLOW UP: Our staff will call the number listed on your records 48-72 hours following your procedure to check on you and address any questions or concerns that you may have regarding the information given to you following your procedure. If we do not reach you, we will leave a message.  We will attempt to reach you two times.  During this call, we will ask if you have developed any symptoms of COVID 19. If you develop any symptoms (ie: fever, flu-like symptoms, shortness of breath, cough etc.) before then, please call [phone].  If you test positive for Covid 19 in the 2 weeks post procedure, please call and report this information to [place].    If any biopsies were taken you will be contacted by phone or by letter within the next 1-3 weeks.  Please call us at [phone] if you have not heard about the biopsies in 3 weeks.    SIGNATURES/CONFIDENTIALITY: You and/or your care partner have signed paperwork which will  be entered into your electronic medical record.  These signatures attest to the fact that that the information above on your After Visit Summary has been reviewed and is understood.  Full responsibility of the confidentiality of this discharge information lies with you and/or your care-partner. 

## 2018-12-16 NOTE — Op Note (Signed)
[facility] Patient Name: Penny Miller Procedure Date: 12/16/2018 3:22 PM MRN: [id number] Endoscopist: Remo Lipps P. Havery Moros , MD Age: 85 Referring MD:  Date of Birth: 1931/05/24 Gender: Female Account #: [id number] Procedure:                Colonoscopy Indications:              Iron deficiency anemia, anticoagulated Medicines:                Monitored Anesthesia Care Procedure:                Pre-Anesthesia Assessment:                           - Prior to the procedure, a History and Physical                            was performed, and patient medications and                            allergies were reviewed. The patient's tolerance of                            previous anesthesia was also reviewed. The risks                            and benefits of the procedure and the sedation                            options and risks were discussed with the patient.                            All questions were answered, and informed consent                            was obtained. Prior Anticoagulants: The patient has                            taken Eliquis (apixaban), last dose was 3 days                            prior to procedure. ASA Grade Assessment: III - A                            patient with severe systemic disease. After                            reviewing the risks and benefits, the patient was                            deemed in satisfactory condition to undergo the                            procedure.  After obtaining informed consent, the colonoscope                            was passed under direct vision. Throughout the                            procedure, the patient's blood pressure, pulse, and                            oxygen saturations were monitored continuously. The                            Colonoscope was introduced through the anus and                            advanced to the the cecum, identified by                 appendiceal orifice and ileocecal valve. The                            colonoscopy was performed without difficulty. The                            patient tolerated the procedure well. The quality                            of the bowel preparation was adequate. The                            ileocecal valve, appendiceal orifice, and rectum                            were photographed. Scope In: 4:06:00 PM Scope Out: 4:36:27 PM Scope Withdrawal Time: 0 hours 23 minutes 23 seconds  Total Procedure Duration: 0 hours 30 minutes 27 seconds  Findings:                 The perianal and digital rectal examinations were                            normal.                           Four angiodysplastic lesions without bleeding were                            found in the ascending colon, in the cecum and at                            the ileocecal valve. One was quite large, > 1cm in                            size, wrapping around the IC valve  A 5 mm polyp was found in the descending colon. The                            polyp was sessile. The polyp was removed with a                            cold snare. Resection and retrieval were complete.                           Multiple small-mouthed diverticula were found in                            the sigmoid colon.                           A localized area of friable / erythematous mucosa                            was found in the sigmoid colon, roughly 35cm from                            the anal verge in the reduced position. It appeared                            inflamed with associated purulence noted. It was                            difficult to fully visualize given stricturing /                            rigidity in the area. Biopsies were taken with a                            cold forceps for histology. I suspect inflammatory                            related to diverticular disease /  diverticulitis,                            but area distal to this area was tattooed with an                            injection of Spot (carbon black).                           A 3 mm polyp was found in the recto-sigmoid colon.                            The polyp was sessile. The polyp was removed with a                            cold snare. Resection and retrieval were complete.  The exam was otherwise without abnormality. Complications:            No immediate complications. Estimated blood loss:                            Minimal. Estimated Blood Loss:     Estimated blood loss was minimal. Impression:               - Four non-bleeding colonic angiodysplastic                            lesions, one of them quite large at the IC valve                           - One 5 mm polyp in the descending colon, removed                            with a cold snare. Resected and retrieved.                           - Diverticulosis in the sigmoid colon.                           - Friability in the sigmoid colon with inflammatory                            changes - I suspect reactive (? diverticilitis /                            diverticular disease). Biopsied. Tattooed.                           - One 3 mm polyp at the recto-sigmoid colon,                            removed with a cold snare. Resected and retrieved.                           - The examination was otherwise normal.                           Overall, suspect iron deficiency anemia may most                            likely be related to AVMs in the colon in the                            setting of anticoagulation. Inflammatory change                            noted in the left colon, unclear chronicity of that                            and if related. Biopsies taken to ensure no  malignancy / dysplasia which seems less likely. Recommendation:           - Patient has a  contact number available for                            emergencies. The signs and symptoms of potential                            delayed complications were discussed with the                            patient. Return to normal activities tomorrow.                            Written discharge instructions were provided to the                            patient.                           - Resume previous diet.                           - Continue present medications. Continue iron                            supplementation                           - Resume Eliquis tomorrow afternoon (24 hours)                           - Await pathology results.                           - CBC today, reassess anemia and for leukocytosis.                            Will give antibiotics for any symptoms of                            diverticulitis, however patient has denied pain. Remo Lipps P. Armbruster, MD 12/16/2018 4:48:46 PM This report has been signed electronically.

## 2018-12-16 NOTE — Progress Notes (Signed)
Report given to PACU, vss 

## 2018-12-16 NOTE — Progress Notes (Signed)
Pt's states no medical or surgical changes since previsit or office visit. 

## 2018-12-18 ENCOUNTER — Telehealth: Payer: Self-pay

## 2018-12-18 ENCOUNTER — Other Ambulatory Visit: Payer: Self-pay

## 2018-12-18 DIAGNOSIS — D508 Other iron deficiency anemias: Secondary | ICD-10-CM

## 2018-12-18 NOTE — Telephone Encounter (Signed)
First post procedure follow up call, no answer 

## 2018-12-21 ENCOUNTER — Ambulatory Visit: Payer: Medicare Other | Admitting: Family Medicine

## 2018-12-24 ENCOUNTER — Other Ambulatory Visit: Payer: Self-pay

## 2018-12-24 ENCOUNTER — Encounter: Payer: Self-pay | Admitting: Cardiology

## 2018-12-24 ENCOUNTER — Ambulatory Visit (HOSPITAL_COMMUNITY)
Admission: RE | Admit: 2018-12-24 | Discharge: 2018-12-24 | Disposition: A | Discharge status: Home / Self Care | Payer: Medicare Other | Source: Ambulatory Visit | Attending: Internal Medicine | Admitting: Internal Medicine

## 2018-12-24 DIAGNOSIS — D508 Other iron deficiency anemias: Secondary | ICD-10-CM | POA: Diagnosis not present

## 2018-12-24 MED ORDER — SODIUM CHLORIDE 0.9 % IV SOLN
510.0000 mg | INTRAVENOUS | Status: DC
  Administered 2018-12-24: 08:00:00 510 mg via INTRAVENOUS
  Filled 2018-12-24: qty 17

## 2018-12-24 MED ORDER — SODIUM CHLORIDE 0.9 % IV SOLN
INTRAVENOUS | Status: DC | PRN
  Administered 2018-12-24: 250 mL via INTRAVENOUS

## 2018-12-24 NOTE — Progress Notes (Signed)
PATIENT CARE CENTER NOTE  Diagnosis:  Other iron deficiency anemia (D50.8)   Provider: Ch[place]ango Cellar. MD   Procedure: IV Feraheme    Note: Patient received Feraheme infusion. Tolerated well with no adverse reaction. Observed patient for 30 minutes post-infusion. BP elevated at 163/62. Patient reports that she did not take blood pressure medications this morning. Patient advised to take morning BP medications before next infusion. Discharge instructions given. Patient to come back next week for second infusion. Alert, oriented and ambulatory at discharge.

## 2018-12-24 NOTE — Discharge Instructions (Signed)

## 2018-12-25 ENCOUNTER — Telehealth: Payer: Self-pay | Admitting: Gastroenterology

## 2018-12-25 NOTE — Telephone Encounter (Signed)
Pt stated that she is returning your call.  °

## 2018-12-26 ENCOUNTER — Other Ambulatory Visit: Payer: Self-pay | Admitting: Internal Medicine

## 2018-12-28 NOTE — Telephone Encounter (Signed)
High risk or very high risk warning populated when attempting to refill medication. RX request sent to PCP for review and approval if warranted.   

## 2019-01-04 DIAGNOSIS — C44622 Squamous cell carcinoma of skin of right upper limb, including shoulder: Secondary | ICD-10-CM | POA: Diagnosis not present

## 2019-01-04 DIAGNOSIS — L570 Actinic keratosis: Secondary | ICD-10-CM | POA: Diagnosis not present

## 2019-01-04 DIAGNOSIS — D485 Neoplasm of uncertain behavior of skin: Secondary | ICD-10-CM | POA: Diagnosis not present

## 2019-01-04 DIAGNOSIS — L821 Other seborrheic keratosis: Secondary | ICD-10-CM | POA: Diagnosis not present

## 2019-01-05 ENCOUNTER — Other Ambulatory Visit: Payer: Self-pay

## 2019-01-05 ENCOUNTER — Ambulatory Visit (HOSPITAL_COMMUNITY)
Admission: RE | Admit: 2019-01-05 | Discharge: 2019-01-05 | Disposition: A | Discharge status: Home / Self Care | Payer: Medicare Other | Source: Ambulatory Visit | Attending: Internal Medicine | Admitting: Internal Medicine

## 2019-01-05 DIAGNOSIS — D509 Iron deficiency anemia, unspecified: Secondary | ICD-10-CM | POA: Insufficient documentation

## 2019-01-05 MED ORDER — SODIUM CHLORIDE 0.9 % IV SOLN
510.0000 mg | Freq: Once | INTRAVENOUS | Status: AC
  Administered 2019-01-05: 510 mg via INTRAVENOUS
  Filled 2019-01-05: qty 17

## 2019-01-05 MED ORDER — SODIUM CHLORIDE 0.9 % IV SOLN
INTRAVENOUS | Status: DC | PRN
  Administered 2019-01-05: 11:00:00 250 mL via INTRAVENOUS

## 2019-01-05 NOTE — Discharge Instructions (Signed)

## 2019-01-05 NOTE — Progress Notes (Signed)
Patient received Feraheme via PIV. Observed for at least 30 minutes post infusion.Tolerated well, vitals stable, discharge instructions given, verbalized understanding. Patient alert, oriented and ambulatory at the time of discharge.  

## 2019-01-11 ENCOUNTER — Ambulatory Visit: Payer: Medicare Other | Admitting: Podiatry

## 2019-01-17 LAB — CUP PACEART REMOTE DEVICE CHECK
Date Time Interrogation Session: 20201016184126
Implantable Pulse Generator Implant Date: 20180502

## 2019-01-18 ENCOUNTER — Ambulatory Visit (INDEPENDENT_AMBULATORY_CARE_PROVIDER_SITE_OTHER): Payer: Medicare Other | Admitting: *Deleted

## 2019-01-18 ENCOUNTER — Telehealth: Payer: Self-pay | Admitting: Emergency Medicine

## 2019-01-18 DIAGNOSIS — I480 Paroxysmal atrial fibrillation: Secondary | ICD-10-CM

## 2019-01-18 NOTE — Telephone Encounter (Signed)
Pt returning [place] phone call. I helped the pt send her remote transmission with her home monitor. Transmission received. I let her talk with Jenny Reichmann.

## 2019-01-18 NOTE — Telephone Encounter (Signed)
Reports that she was receiving blood transfusion on October 10th and was not allowed to leave for awhile after due to SB in 30's. Has had stools that were hemocult +.She reports that after transfusion she had several episodes of lethargy and weakness. She also reports dizziness and " just had to lay down".

## 2019-01-18 NOTE — Telephone Encounter (Signed)
LMOM. Need patient to send remote transmission due to alert for pauses. SB at 35 during event 01/11/19 that lasted 3 seconds.

## 2019-01-20 NOTE — Telephone Encounter (Signed)
Pt granddaughter calling because the pt requested that she talk with Jenny Reichmann about the conversation the pt had with her. The pt has a difficult time understanding things. I let [name] talk with Sheran Luz.

## 2019-01-20 NOTE — Telephone Encounter (Signed)
Reviewed with A. Tillery, PA. Received verbal order to STOP atenolol. Follow-up in-office within the next few weeks.  Spoke with [name]. She is agreeable to this plan. She will monitor patient's BP at home and let us know if systolic BP is persistently >130 off of the atenolol. Scheduled with A. Tillery, [place] on 02/10/19 at 8:50am. [name] is aware of office address, will need to come in with patient as patient is HOH. She is aware to call back in the interim if patient is experiencing any new symptoms. No further questions at this time.

## 2019-01-20 NOTE — Telephone Encounter (Signed)
Spoke with [name]. Patient received has been receiving iron infusions for anemia. Most recent infusion was on 10/6, not on 10/10. HRs are often in the 50s at home, but at most recent infusion appointment, HRs were in the 40s and patient was monitored for an extended period of time. Pt is taking atenolol 12.5mg  daily. [name] reports patient has been fatigued and weak, but they thought it was mostly related to her anemia.   Explained I will discuss with Oda Kilts, PA, for recommendations and call her back. [name] is in agreement with this plan, denies any additional questions at this time.

## 2019-01-22 ENCOUNTER — Other Ambulatory Visit: Payer: Self-pay | Admitting: *Deleted

## 2019-01-22 DIAGNOSIS — M5416 Radiculopathy, lumbar region: Secondary | ICD-10-CM

## 2019-01-22 MED ORDER — GABAPENTIN 300 MG PO CAPS: 300.0000 mg | ORAL_CAPSULE | Freq: Three times a day (TID) | ORAL | 1 refills | Status: DC

## 2019-01-22 NOTE — Telephone Encounter (Signed)
Patient requested refill

## 2019-01-25 ENCOUNTER — Ambulatory Visit (INDEPENDENT_AMBULATORY_CARE_PROVIDER_SITE_OTHER): Payer: Medicare Other | Admitting: Family Medicine

## 2019-01-25 ENCOUNTER — Encounter: Payer: Self-pay | Admitting: Family Medicine

## 2019-01-25 ENCOUNTER — Other Ambulatory Visit: Payer: Self-pay

## 2019-01-25 VITALS — BP 142/67 | HR 60 | Temp 97.3°F | Ht 65.0 in | Wt 183.2 lb

## 2019-01-25 DIAGNOSIS — Z23 Encounter for immunization: Secondary | ICD-10-CM | POA: Diagnosis not present

## 2019-01-25 DIAGNOSIS — M5416 Radiculopathy, lumbar region: Secondary | ICD-10-CM | POA: Diagnosis not present

## 2019-01-25 DIAGNOSIS — I480 Paroxysmal atrial fibrillation: Secondary | ICD-10-CM

## 2019-01-25 DIAGNOSIS — I63442 Cerebral infarction due to embolism of left cerebellar artery: Secondary | ICD-10-CM | POA: Diagnosis not present

## 2019-01-25 DIAGNOSIS — D508 Other iron deficiency anemias: Secondary | ICD-10-CM | POA: Diagnosis not present

## 2019-01-25 DIAGNOSIS — E1122 Type 2 diabetes mellitus with diabetic chronic kidney disease: Secondary | ICD-10-CM | POA: Diagnosis not present

## 2019-01-25 MED ORDER — GABAPENTIN 300 MG PO CAPS: 300.0000 mg | ORAL_CAPSULE | Freq: Three times a day (TID) | ORAL | 3 refills | Status: DC

## 2019-01-25 NOTE — Progress Notes (Signed)
I reviewed note and agree with plan.   Penni Bombard, MD 68/25/7493, 55:21 PM Certified in Neurology, Neurophysiology and Neuroimaging  [facility] [address], [place], [place] [address] [phone]

## 2019-01-25 NOTE — Patient Instructions (Addendum)
Continue gabapentin 2-3 tablets daily as needed for radiculopathy  Continue management of chronic disease. BP goal < 130/80, LDL goal < 70, A1C goal < 7.   May continue gabapentin refills with PCP, return here as needed   Stroke Prevention Some medical conditions and lifestyle choices can lead to a higher risk for a stroke. You can help to prevent a stroke by making nutrition, lifestyle, and other changes. What nutrition changes can be made?   Eat healthy foods. ? Choose foods that are high in fiber. These include:  Fresh fruits.  Fresh vegetables.  Whole grains. ? Eat at least 5 or more servings of fruits and vegetables each day. Try to fill half of your plate at each meal with fruits and vegetables. ? Choose lean protein foods. These include:  Lowfat (lean) cuts of meat.  Chicken without skin.  Fish.  Tofu.  Beans.  Nuts. ? Eat low-fat dairy products. ? Avoid foods that:  Are high in salt (sodium).  Have saturated fat.  Have trans fat.  Have cholesterol.  Are processed.  Are premade.  Follow eating guidelines as told by your doctor. These may include: ? Reducing how many calories you eat and drink each day. ? Limiting how much salt you eat or drink each day to 1,500 milligrams (mg). ? Using only healthy fats for cooking. These include:  Olive oil.  Canola oil.  Sunflower oil. ? Counting how many carbohydrates you eat and drink each day. What lifestyle changes can be made?  Try to stay at a healthy weight. Talk to your doctor about what a good weight is for you.  Get at least 30 minutes of moderate physical activity at least 5 days a week. This can include: ? Fast walking. ? Biking. ? Swimming.  Do not use any products that have nicotine or tobacco. This includes cigarettes and e-cigarettes. If you need help quitting, ask your doctor. Avoid being around tobacco smoke in general.  Limit how much alcohol you drink to no more than 1 drink a day for  nonpregnant women and 2 drinks a day for men. One drink equals 12 oz of beer, 5 oz of wine, or 1 oz of hard liquor.  Do not use drugs.  Avoid taking birth control pills. Talk to your doctor about the risks of taking birth control pills if: ? You are over 69 years old. ? You smoke. ? You get migraines. ? You have had a blood clot. What other changes can be made?  Manage your cholesterol. ? It is important to eat a healthy diet. ? If your cholesterol cannot be managed through your diet, you may also need to take medicines. Take medicines as told by your doctor.  Manage your diabetes. ? It is important to eat a healthy diet and to exercise regularly. ? If your blood sugar cannot be managed through diet and exercise, you may need to take medicines. Take medicines as told by your doctor.  Control your high blood pressure (hypertension). ? Try to keep your blood pressure below 130/80. This can help lower your risk of stroke. ? It is important to eat a healthy diet and to exercise regularly. ? If your blood pressure cannot be managed through diet and exercise, you may need to take medicines. Take medicines as told by your doctor. ? Ask your doctor if you should check your blood pressure at home. ? Have your blood pressure checked every year. Do this even if your blood pressure  is normal.  Talk to your doctor about getting checked for a sleep disorder. Signs of this can include: ? Snoring a lot. ? Feeling very tired.  Take over-the-counter and prescription medicines only as told by your doctor. These may include aspirin or blood thinners (antiplatelets or anticoagulants).  Make sure that any other medical conditions you have are managed. Where to find more information  American Stroke Association: www.strokeassociation.org  National Stroke Association: www.stroke.org Get help right away if:  You have any symptoms of stroke. "BE FAST" is an easy way to remember the main warning signs:  ? B - Balance. Signs are dizziness, sudden trouble walking, or loss of balance. ? E - Eyes. Signs are trouble seeing or a sudden change in how you see. ? F - Face. Signs are sudden weakness or loss of feeling of the face, or the face or eyelid drooping on one side. ? A - Arms. Signs are weakness or loss of feeling in an arm. This happens suddenly and usually on one side of the body. ? S - Speech. Signs are sudden trouble speaking, slurred speech, or trouble understanding what people say. ? T - Time. Time to call emergency services. Write down what time symptoms started.  You have other signs of stroke, such as: ? A sudden, very bad headache with no known cause. ? Feeling sick to your stomach (nausea). ? Throwing up (vomiting). ? Jerky movements you cannot control (seizure). These symptoms may represent a serious problem that is an emergency. Do not wait to see if the symptoms will go away. Get medical help right away. Call your local emergency services (911 in the [place] Do not drive yourself to the hospital. Summary  You can prevent a stroke by eating healthy, exercising, not smoking, drinking less alcohol, and treating other health problems, such as diabetes, high blood pressure, or high cholesterol.  Do not use any products that contain nicotine or tobacco, such as cigarettes and e-cigarettes.  Get help right away if you have any signs or symptoms of a stroke. This information is not intended to replace advice given to you by your health care provider. Make sure you discuss any questions you have with your health care provider. Document Released: 09/17/2011 Document Revised: 05/14/2018 Document Reviewed: 06/19/2016 Elsevier Patient Education  2020 [employer].

## 2019-01-25 NOTE — Progress Notes (Signed)
PATIENT: Penny Miller DOB: 12-28-1931  REASON FOR VISIT: follow up HISTORY FROM: patient  Chief Complaint  Patient presents with  . Follow-up    6 mon f/u. Granddaughter present. Rm 2. No new concerns at this time.   . Medication Refill    Gabapentin     HISTORY OF PRESENT ILLNESS: Today 01/25/19 Penny Miller is a 85 y.o. female here today for follow up for left leg pain post CVA in 2018.  Penny Miller reports that she is doing very well today.  She continues 300 mg gabapentin 2-3 times daily for pain.  She reports that this significantly helps.  She was out of her medication for 2 to 3 days last week and noticed that pain increased.  After resuming medication pain has improved.  She continues close follow-up with primary care for management of chronic disease.  Blood pressure is typically 130s over 80s.  Last A1c was 7.2.  She is uncertain of last LDL results.  She continues glipizide, Lipitor and Eliquis.  She sees cardiology regularly for bradycardia, atrial fibrillation and congestive heart failure.  She was recently started on Lasix.  Beta-blocker was discontinued.  She is also followed by hematology for iron deficiency anemia.  She denies any concerns of stroke.    HISTORY: (copied from my note on 05/27/2018)  Penny Miller is a 85 y.o. female here today for follow up of left leg pain post stroke in 2018. Pain is described as an achy pain that is located in bilateral lumbar region. It is worse with sitting for extended periods. She does feel a catch in her back with walking but can walk in grocery store with buggy. She states that the pain is different from the previous leg pain but she does continue to have some shooting pains bilaterally. She has to constantly change positions while sitting and sleeping. She feels it is worse in the mornings before stretching. She has been concerned over the past 3 months as she has noted her gait is different. She had a near fall in the doctor's  office after standing and getting a shooting pain in her back. She has participated in PT post stroke but not recently. She denies numbness or tingling of extremities. MRI in 2018 showed severe degenerative changes of lumbar spine. She was advised to consider injection therapy but has been hesitant due to her age and taking blood thinners.   She was started on gabapentin 100mg  TID that did not offer much relief. In 03/2017 dose was increased to 300mg  BID. She reports that this did offer relief for a period of time but now pain has worsened. She was advised to consider   She continues Eliquis for afib. She is not taking NSAIDs OTC. She is taking atorvastatin for HLD and stroke prevention.    HISTORY: (copied from Penny Miller note on 03/28/2017) UPDATE (03/28/17, VRP): Since last visit, doingwell overall. Toleratingmeds (eliquis and gabapentin). No alleviating or aggravating factors.[name], but no major injuries. Feels well now.   PRIOR HPI (09/03/16):85 year old female with hypertension, diabetes, hypercholesteremia, [name], developed new onset of left leg pain from left foot to left hip and back, with numbness and electrical shooting pain, 1 week after discharge. Patient had been doing 1 week of outpatient physical therapy when symptoms started. Symptoms worse with activity and standing. Alleviated with change in position. Patient tried to reduce physical  activity slightly but symptoms of pain in left leg continued. Patient has history of low back pain radiating to the right leg in the past. Patient was prescribed gabapentin 100 mg at bedtime without relief. Also patient has now been diagnosed with atrial fibrillation based on cardiac heart monitor and has been on anticoagulation with Eliquis since 08/27/16.  DISCHARGE SUMMARY (07/31/16, Curly Rim): "85 year old Caucasian female with a past  medical history of hypertension, chronic kidney disease stage II, hyperlipidemia on statin, type 2 diabetes, dementia, presented with left lower extremity weakness and dizziness. MRI showed left cerebellar stroke. Patient was hospitalized. She was outside the TPA window as determined by neurology."   REVIEW OF SYSTEMS: Out of a complete 14 system review of symptoms, the patient complains only of the following symptoms, dizziness, numbness, weakness, passing out and all other reviewed systems are negative.  ALLERGIES: Allergies  Allergen Reactions  . Jardiance [Empagliflozin] Diarrhea  . Metformin And Related Diarrhea  . Ceclor [Cefaclor] Other (See Comments)    Unknown reaction; not recalled  . Codeine Nausea And Vomiting  . Lopid [Gemfibrozil] Other (See Comments)    Unknown reaction; not recalled  . Metoprolol Nausea And Vomiting  . Morphine And Related Other (See Comments)    Passed out  . Niacin Nausea And Vomiting    Unknown reaction Unknown reaction; not recalled  . Niacin And Related Other (See Comments)    Unknown reaction; not recalled  . Other   . Relafen [Nabumetone] Nausea And Vomiting  . Septra [Sulfamethoxazole-Trimethoprim] Nausea And Vomiting  . Tetracycline Nausea And Vomiting  . Toprol Xl [Metoprolol Tartrate] Nausea And Vomiting  . Adhesive [Tape] Rash and Other (See Comments)    Tears the skin also    HOME MEDICATIONS: Outpatient Medications Prior to Visit  Medication Sig Dispense Refill  . ACCU-CHEK AVIVA PLUS test strip USE TO TEST BLOOD SUGAR TWICE DAILY. 100 each 11  . albuterol (VENTOLIN HFA) 108 (90 Base) MCG/ACT inhaler Inhale 2 puffs into the lungs every 6 (six) hours as needed for wheezing or shortness of breath. 18 g 0  . amLODipine (NORVASC) 2.5 MG tablet Take 1 tablet (2.5 mg total) by mouth daily. 180 tablet 1  . atorvastatin (LIPITOR) 40 MG tablet TAKE 1 TABLET DAILY AT 6 P.M. 90 tablet 3  . citalopram (CELEXA) 10 MG tablet TAKE 1 TABLET DAILY  90 tablet 0  . ELIQUIS 5 MG TABS tablet TAKE 1 TABLET TWICE A DAY 180 tablet 3  . ferrous sulfate 325 (65 FE) MG EC tablet Take 1 tablet (325 mg total) by mouth 2 (two) times daily. 60 tablet 5  . furosemide (LASIX) 20 MG tablet Take 20 mg by mouth daily.    . Lancets (ACCU-CHEK MULTICLIX) lancets Use to test blood sugar twice daily. E11.29 100 each 11  . omeprazole (PRILOSEC) 20 MG capsule Take 1 capsule (20 mg total) by mouth 2 (two) times daily before a meal. 90 capsule 3  . pioglitazone (ACTOS) 15 MG tablet TAKE 1 TABLET DAILY 90 tablet 3  . Vitamin D, Ergocalciferol, (DRISDOL) 1.25 MG (50000 UT) CAPS capsule TAKE 1 CAPSULE ONCE WEEKLY FOR VITAMIN D 12 capsule 3  . gabapentin (NEURONTIN) 300 MG capsule Take 1 capsule (300 mg total) by mouth 3 (three) times daily. 270 capsule 1   No facility-administered medications prior to visit.     PAST MEDICAL HISTORY: Past Medical History:  Diagnosis Date  . Allergic rhinitis, cause unspecified   . Anxiety state, unspecified   .  Arthritis   . Colon polyp   . Diaphragmatic hernia without mention of obstruction or gangrene   . Diverticulosis of colon (without mention of hemorrhage)   . Female stress incontinence   . HTN (hypertension)   . Insomnia, unspecified   . Interstitial cystitis    surgery 02/01/2013  . Irritable bowel syndrome   . Memory loss   . Other and unspecified hyperlipidemia   . Other premature beats   . Polyneuropathy in diabetes(357.2)   . Postmenopausal bleeding   . Proteinuria   . Rectal ulcer   . Skin cancer   . Stroke ([place])   . 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 Unspecified essential hypertension   . Viral warts, unspecified     PAST SURGICAL HISTORY: Past Surgical History:  Procedure Laterality Date  . ABDOMINAL HYSTERECTOMY  1977   Penny.Hambright  . APPENDECTOMY  1965  . CATARACT EXTRACTION, BILATERAL  02/10/2006   Penny.Groat    . COLONOSCOPY  07/17/2004   polypectomy  . [place]  . LOOP RECORDER INSERTION N/A 07/31/2016   Procedure: Loop Recorder Insertion;  Surgeon: [name], MD;  Location: [facility];  Service: Cardiovascular;  Laterality: N/A;  . TEE WITHOUT CARDIOVERSION N/A 07/30/2016   Procedure: TRANSESOPHAGEAL ECHOCARDIOGRAM (TEE);  Surgeon: [name], MD;  Location: [facility];  Service: Cardiovascular;  Laterality: N/A;  . [place]  . TRIGGER FINGER RELEASE  05/13/16  . urinary bladder      surgery 02/01/2013 to stretch bladder stem    FAMILY HISTORY: Family History  Problem Relation Age of Onset  . Pancreatic cancer Mother 65  . Heart attack Father   . Bladder Cancer Father   . Hypertension Sister   . Diabetes Sister   . Diabetes Brother   . Kidney cancer Brother   . Hypertension Brother   . Diabetes Brother   . Heart disease Brother 23       Myocardial Infarction     SOCIAL HISTORY: Social History   Socioeconomic History  . Marital status: Widowed    Spouse name: Not on file  . Number of children: 1  . Years of education: Not on file  . Highest education level: Not on file  Occupational History  . 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 Financial resource strain: Not hard at all  . Food insecurity    Worry: Never true    Inability: Never true  . Transportation needs    Medical: No    Non-medical: No  Tobacco Use  . Smoking status: Former Smoker    Packs/day: 0.25    Years: 62.00    Pack years: 15.50    Quit date: 04/02/2011    Years since quitting: 7.8  . Smokeless tobacco: Never Used  . Tobacco comment: stopped consistently smoking in 2012 but still smokes occasionally (one pack per week)  Substance and Sexual Activity  . Alcohol use: No  . Drug use: No  . Sexual activity: Never  Lifestyle  . Physical activity    Days per week: 7 days    Minutes per session: 10  min  . Stress: Not at all  Relationships  . Social connections    Talks on phone: More than three times a week    Gets together: More than three times  a week    Attends religious service: Never    Active member of club or organization: No    Attends meetings of clubs or organizations: Never    Relationship status: Widowed  . Intimate partner violence    Fear of current or ex partner: No    Emotionally abused: No    Physically abused: No    Forced sexual activity: No  Other Topics Concern  . Not on file  Social History Narrative   Ophthalmologist-Penny.Gould and Penny.Groat   Podiatrist- Penny.Tuchman   Dermatologist- Penny.Lupton   Urologist- Penny. Lawerance Bach    Lives with her son.       PHYSICAL EXAM  Vitals:   01/25/19 1107  BP: (!) 142/67  Pulse: 60  Temp: (!) 97.3 F (36.3 C)  TempSrc: Oral  Weight: 183 lb 3.2 oz (83.1 kg)  Height: 5\' 5"  (1.651 m)   Body mass index is 30.49 kg/m.  Generalized: Well developed, in no acute distress  Cardiology: normal rate and rhythm, no murmur noted Neurological examination  Mentation: Alert oriented to time, place, history taking. Follows all commands speech and language fluent Cranial nerve II-XII: Pupils were equal round reactive to light. Extraocular movements were full, visual field were full on confrontational test. Facial sensation and strength were normal. Uvula tongue midline. Head turning and shoulder shrug  were normal and symmetric. Motor: The motor testing reveals 5 over 5 strength of all 4 extremities with exception of left hip flexion 4/5. Good symmetric motor tone is noted throughout.  Sensory: Sensory testing is intact to soft touch on all 4 extremities. No evidence of extinction is noted.  Coordination: Cerebellar testing reveals good finger-nose-finger and heel-to-shin bilaterally.  Gait and station: Gait is normal. Romberg is negative. No drift is seen.    DIAGNOSTIC DATA (LABS, IMAGING, TESTING) - I reviewed patient  records, labs, notes, testing and imaging myself where available.  MMSE - Mini Mental State Exam 07/21/2017 05/07/2016 12/21/2013  Orientation to time 5 5 5   Orientation to Place 5 4 4   Registration 3 3 3   Attention/ Calculation 5 5 5   Recall 3 3 2   Language- name 2 objects 2 2 2   Language- repeat 1 1 1   Language- follow 3 step command 3 3 3   Language- read & follow direction 1 1 1   Write a sentence 1 1 1   Copy design 1 1 1   Total score 30 29 28      Lab Results  Component Value Date   WBC 7.7 12/16/2018   HGB 9.8 (L) 12/16/2018   HCT 30.7 (L) 12/16/2018   MCV 83.9 12/16/2018   PLT 189.0 12/16/2018      Component Value Date/Time   NA 136 11/11/2018 0846   K 4.9 11/11/2018 0846   CL 99 11/11/2018 0846   CO2 21 11/11/2018 0846   GLUCOSE 194 (H) 11/11/2018 0846   GLUCOSE 122 11/02/2018 1408   BUN 25 11/11/2018 0846   CREATININE 1.61 (H) 11/11/2018 0846   CREATININE 1.40 (H) 11/02/2018 1408   CALCIUM 9.8 11/11/2018 0846   PROT 6.5 11/02/2018 1408   PROT 6.1 06/23/2015 0927   ALBUMIN 3.2 (L) 02/01/2018 0613   ALBUMIN 3.9 06/23/2015 0927   AST 14 11/02/2018 1408   ALT 15 11/02/2018 1408   ALKPHOS 74 02/01/2018 0613   BILITOT 0.4 11/02/2018 1408   BILITOT 0.3 06/23/2015 0927   GFRNONAA 29 (L) 11/11/2018 0846   GFRNONAA 34 (L) 11/02/2018 1408   GFRAA 33 (  L) 11/11/2018 0846   GFRAA 40 (L) 11/02/2018 1408   Lab Results  Component Value Date   CHOL 135 03/17/2017   HDL 40 (L) 03/17/2017   LDLCALC 69 03/17/2017   TRIG 179 (H) 03/17/2017   CHOLHDL 3.4 03/17/2017   Lab Results  Component Value Date   HGBA1C 7.2 (H) 11/02/2018   No results found for: VITAMINB12 Lab Results  Component Value Date   TSH 4.432 07/29/2016       ASSESSMENT AND PLAN 85 y.o. year old female  has a past medical history of Allergic rhinitis, cause unspecified, Anxiety state, unspecified, Arthritis, Colon polyp, Diaphragmatic hernia without mention of obstruction or gangrene, Diverticulosis  of colon (without mention of hemorrhage), Female stress incontinence, HTN (hypertension), Insomnia, unspecified, Interstitial cystitis, Irritable bowel syndrome, Memory loss, Other and unspecified hyperlipidemia, Other premature beats, Polyneuropathy in diabetes(357.2), Postmenopausal bleeding, Proteinuria, Rectal ulcer, Skin cancer, Stroke ([place]), Type II or 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 here with     [id number]   1. Embolic stroke involving left cerebellar artery ([place])  I63.442   2. Paroxysmal atrial fibrillation ([facility])  I48.0   3. Lumbar radiculopathy  M54.16 gabapentin (NEURONTIN) 300 MG capsule  4. Type 2 diabetes mellitus with chronic kidney disease, without long-term current use of insulin, unspecified CKD stage ([facility])  E11.22   5. Other iron deficiency anemia  D50.8     Penny Miller is doing very well today.  Gabapentin helps to manage neuropathy and radiculopathy.  We will continue gabapentin 300 mg 2-3 times daily as needed.  She will continue close follow-up with primary care for stroke prevention.  We have discussed blood pressure, A1c and LDL goals.  Stroke education provided in AVS.  She will continue close follow-up with cardiology and hematology as directed.  Gabapentin refilled for 1 year.  She was advised that she may request that additional refills of gabapentin be provided through primary care if willing.  Otherwise she will follow-up with me annually.  She and her granddaughter verbalized understanding and agreement with this plan.   No orders of the defined types were placed in this encounter.    Meds ordered this encounter  Medications  . gabapentin (NEURONTIN) 300 MG capsule    Sig: Take 1 capsule (300 mg total) by mouth 3 (three) times daily.    Dispense:  270 capsule    Refill:  3    Order Specific Question:   Supervising Provider     Answer:   Melvenia Beam [id number]      I spent 20 minutes with the patient. 50% of this time was spent counseling and educating patient on plan of care and medications.    Debbora Presto, FNP-C 01/25/2019, 12:02 PM [facility] [address] [place], [place] [address] [phone]

## 2019-01-29 ENCOUNTER — Other Ambulatory Visit (INDEPENDENT_AMBULATORY_CARE_PROVIDER_SITE_OTHER): Payer: Medicare Other

## 2019-01-29 ENCOUNTER — Encounter: Payer: Self-pay | Admitting: Gastroenterology

## 2019-01-29 ENCOUNTER — Ambulatory Visit (INDEPENDENT_AMBULATORY_CARE_PROVIDER_SITE_OTHER): Payer: Medicare Other | Admitting: Gastroenterology

## 2019-01-29 VITALS — BP 136/60 | HR 68 | Temp 98.0°F | Ht 65.75 in | Wt 183.1 lb

## 2019-01-29 DIAGNOSIS — K2990 Gastroduodenitis, unspecified, without bleeding: Secondary | ICD-10-CM | POA: Diagnosis not present

## 2019-01-29 DIAGNOSIS — R1032 Left lower quadrant pain: Secondary | ICD-10-CM

## 2019-01-29 DIAGNOSIS — K2970 Gastritis, unspecified, without bleeding: Secondary | ICD-10-CM

## 2019-01-29 DIAGNOSIS — K5520 Angiodysplasia of colon without hemorrhage: Secondary | ICD-10-CM | POA: Diagnosis not present

## 2019-01-29 DIAGNOSIS — I639 Cerebral infarction, unspecified: Secondary | ICD-10-CM | POA: Diagnosis not present

## 2019-01-29 DIAGNOSIS — D509 Iron deficiency anemia, unspecified: Secondary | ICD-10-CM | POA: Diagnosis not present

## 2019-01-29 LAB — IBC + FERRITIN
Ferritin: 125.7 ng/mL (ref 10.0–291.0)
Iron: 65 ug/dL (ref 42–145)
Saturation Ratios: 21.6 % (ref 20.0–50.0)
Transferrin: 215 mg/dL (ref 212.0–360.0)

## 2019-01-29 LAB — CBC WITH DIFFERENTIAL/PLATELET
Basophils Absolute: 0.1 10*3/uL (ref 0.0–0.1)
Basophils Relative: 1.2 % (ref 0.0–3.0)
Eosinophils Absolute: 0.4 10*3/uL (ref 0.0–0.7)
Eosinophils Relative: 4 % (ref 0.0–5.0)
HCT: 38.3 % (ref 36.0–46.0)
Hemoglobin: 12.7 g/dL (ref 12.0–15.0)
Lymphocytes Relative: 15.4 % (ref 12.0–46.0)
Lymphs Abs: 1.4 10*3/uL (ref 0.7–4.0)
MCHC: 33.2 g/dL (ref 30.0–36.0)
MCV: 89.1 fl (ref 78.0–100.0)
Monocytes Absolute: 1.1 10*3/uL — ABNORMAL HIGH (ref 0.1–1.0)
Monocytes Relative: 11.8 % (ref 3.0–12.0)
Neutro Abs: 6.4 10*3/uL (ref 1.4–7.7)
Neutrophils Relative %: 67.6 % (ref 43.0–77.0)
Platelets: 166 10*3/uL (ref 150.0–400.0)
RBC: 4.3 Mil/uL (ref 3.87–5.11)
RDW: 24.9 % — ABNORMAL HIGH (ref 11.5–15.5)
WBC: 9.4 10*3/uL (ref 4.0–10.5)

## 2019-01-29 NOTE — Progress Notes (Signed)
HPI :  85 year old female here for follow-up visit.  I previously saw her for iron deficiency anemia.  At the time she denied any blood in her stools.  She does take Eliquis for history of CVA and atrial fibrillation.  She underwent an EGD and colonoscopy as outlined:  EGD 12/16/18 - - Localized mucosal change, around 8mm in diameter, were found in the middle third of the esophagus around 26cm of the incisors. Biopsies show glycogenic acanthosis. The exam of the esophagus was otherwise normal. Gastritis, negative biopsies for HP. A large diverticulum was found in the second portion of the duodenum. The exam of the duodenum was otherwise normal.  Colonoscopy 12/16/18 - Four non-bleeding colonic angiodysplastic lesions, one of them quite large at the IC valve. One 5 mm polyp in the descending colon. Diverticulosis in the sigmoid colon. Friability in the sigmoid colon with inflammatory changes - I suspect reactive (? diverticilitis /diverticular disease). Biopsied. Tattooed. - One 3 mm polyp at the recto-sigmoid colon. The examination was otherwise normal. Overall, suspect iron deficiency anemia may most likely be related to AVMs in the colon in the setting of anticoagulation. Inflammatory change noted in the left colon, unclear chronicity of that and if related. Biopsies taken to ensure no malignancy / dysplasia which seems less likely. Path showed inflammatory changes in the sigmoid colon. TAs in the left colon.  She had repeat blood work done showing normal white blood cell count and persistent anemia.  She was referred for IV iron.  She states this has helped increase her energy level.  I placed her on omeprazole 20 mg twice a day for gastritis and she states she feels better on that regimen, no reflux, no indigestion that bothers her.  She is not taking any iron tablets because they make her feels nauseated and give a poor taste in her mouth.  She has been having some intermittent mild left lower  quadrant discomfort.  She is not sure if it is related to bowel movements or not.  She states she feels rather tender in that area.  Past Medical History:  Diagnosis Date  . Allergic rhinitis, cause unspecified   . Anxiety state, unspecified   . Arthritis   . Colon polyp   . Diaphragmatic hernia without mention of obstruction or gangrene   . Diverticulosis of colon (without mention of hemorrhage)   . Female stress incontinence   . HTN (hypertension)   . Insomnia, unspecified   . Interstitial cystitis    surgery 02/01/2013  . Irritable bowel syndrome   . Memory loss   . Other and unspecified hyperlipidemia   . Other premature beats   . Polyneuropathy in diabetes(357.2)   . Postmenopausal bleeding   . Proteinuria   . Rectal ulcer   . Skin cancer   . Stroke ([place]) 2018  . 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 Unspecified essential hypertension   . Viral warts, unspecified      Past Surgical History:  Procedure Laterality Date  . ABDOMINAL HYSTERECTOMY  1977   Dr.Hambright  . APPENDECTOMY  1965  . CATARACT EXTRACTION, BILATERAL  02/10/2006   Dr.Groat   . COLONOSCOPY  07/17/2004   polypectomy  . [place]  . LOOP RECORDER INSERTION N/A 07/31/2016   Procedure: Loop Recorder Insertion;  Surgeon: [name], MD;  Location: [facility];  Service: Cardiovascular;  Laterality: N/A;  .  TEE WITHOUT CARDIOVERSION N/A 07/30/2016   Procedure: TRANSESOPHAGEAL ECHOCARDIOGRAM (TEE);  Surgeon: [name], MD;  Location: [facility];  Service: Cardiovascular;  Laterality: N/A;  . [place]  . TRIGGER FINGER RELEASE  05/13/16  . urinary bladder      surgery 02/01/2013 to stretch bladder stem   Family History  Problem Relation Age of Onset  . Pancreatic cancer Mother 66  . Heart attack Father   . Bladder Cancer Father   . Hypertension Sister    . Diabetes Sister   . Diabetes Brother   . Kidney cancer Brother   . Hypertension Brother   . Diabetes Brother   . Heart disease Brother 49       Myocardial Infarction    Social History   Tobacco Use  . Smoking status: Former Smoker    Packs/day: 0.25    Years: 62.00    Pack years: 15.50    Quit date: 04/02/2011    Years since quitting: 7.8  . Smokeless tobacco: Never Used  . Tobacco comment: stopped consistently smoking in 2012 but still smokes occasionally (one pack per week)  Substance Use Topics  . Alcohol use: No  . Drug use: No   Current Outpatient Medications  Medication Sig Dispense Refill  . ACCU-CHEK AVIVA PLUS test strip USE TO TEST BLOOD SUGAR TWICE DAILY. 100 each 11  . albuterol (VENTOLIN HFA) 108 (90 Base) MCG/ACT inhaler Inhale 2 puffs into the lungs every 6 (six) hours as needed for wheezing or shortness of breath. 18 g 0  . amLODipine (NORVASC) 2.5 MG tablet Take 1 tablet (2.5 mg total) by mouth daily. 180 tablet 1  . atorvastatin (LIPITOR) 40 MG tablet TAKE 1 TABLET DAILY AT 6 P.M. 90 tablet 3  . citalopram (CELEXA) 10 MG tablet TAKE 1 TABLET DAILY 90 tablet 0  . ELIQUIS 5 MG TABS tablet TAKE 1 TABLET TWICE A DAY 180 tablet 3  . ferrous sulfate 325 (65 FE) MG EC tablet Take 1 tablet (325 mg total) by mouth 2 (two) times daily. 60 tablet 5  . furosemide (LASIX) 20 MG tablet Take 20 mg by mouth daily.    Marland Kitchen gabapentin (NEURONTIN) 300 MG capsule Take 1 capsule (300 mg total) by mouth 3 (three) times daily. 270 capsule 3  . Lancets (ACCU-CHEK MULTICLIX) lancets Use to test blood sugar twice daily. E11.29 100 each 11  . omeprazole (PRILOSEC) 20 MG capsule Take 1 capsule (20 mg total) by mouth 2 (two) times daily before a meal. 90 capsule 3  . pioglitazone (ACTOS) 15 MG tablet TAKE 1 TABLET DAILY 90 tablet 3  . Vitamin D, Ergocalciferol, (DRISDOL) 1.25 MG (50000 UT) CAPS capsule TAKE 1 CAPSULE ONCE WEEKLY FOR VITAMIN D 12 capsule 3   No current facility-administered  medications for this visit.    Allergies  Allergen Reactions  . Jardiance [Empagliflozin] Diarrhea  . Metformin And Related Diarrhea  . Ceclor [Cefaclor] Other (See Comments)    Unknown reaction; not recalled  . Codeine Nausea And Vomiting  . Lopid [Gemfibrozil] Other (See Comments)    Unknown reaction; not recalled  . Metoprolol Nausea And Vomiting  . Morphine And Related Other (See Comments)    Passed out  . Niacin Nausea And Vomiting    Unknown reaction Unknown reaction; not recalled  . Niacin And Related Other (See Comments)    Unknown reaction; not recalled  . Other   . Relafen [Nabumetone] Nausea And  Vomiting  . Septra [Sulfamethoxazole-Trimethoprim] Nausea And Vomiting  . Tetracycline Nausea And Vomiting  . Toprol Xl [Metoprolol Tartrate] Nausea And Vomiting  . Adhesive [Tape] Rash and Other (See Comments)    Tears the skin also     Review of Systems: All systems reviewed and negative except where noted in HPI.   Lab Results  Component Value Date   WBC 9.4 01/29/2019   HGB 12.7 01/29/2019   HCT 38.3 01/29/2019   MCV 89.1 01/29/2019   PLT 166.0 01/29/2019    Lab Results  Component Value Date   CREATININE 1.61 (H) 11/11/2018   BUN 25 11/11/2018   NA 136 11/11/2018   K 4.9 11/11/2018   CL 99 11/11/2018   CO2 21 11/11/2018    Lab Results  Component Value Date   ALT 15 11/02/2018   AST 14 11/02/2018   ALKPHOS 74 02/01/2018   BILITOT 0.4 11/02/2018    CBC Latest Ref Rng & Units 01/29/2019 12/16/2018 11/11/2018  WBC 4.0 - 10.5 K/uL 9.4 7.7 10.5  Hemoglobin 12.0 - 15.0 g/dL 12.7 9.8(L) 10.4(L)  Hematocrit 36.0 - 46.0 % 38.3 30.7(L) 34.2  Platelets 150.0 - 400.0 K/uL 166.0 189.0 269     Physical Exam: BP 136/60 (BP Location: Left Arm, Patient Position: Sitting, Cuff Size: Normal)   Pulse 68   Temp 98 F (36.7 C)   Ht 5' 5.75" (1.67 m)   Wt 183 lb 2 oz (83.1 kg)   BMI 29.78 kg/m  Constitutional: Pleasant,well-developed, female in no acute  distress. HEENT: Normocephalic and atraumatic. Conjunctivae are normal. No scleral icterus. Neck supple.  Cardiovascular: Normal rate, regular rhythm.  Pulmonary/chest: Effort normal and breath sounds normal. No wheezing, rales or rhonchi. Abdominal: Soft, nondistended, mild LLQ TTP, There are no masses palpable. No hepatomegaly. Extremities: no edema Lymphadenopathy: No cervical adenopathy noted. Neurological: Alert and oriented to person place and time. Skin: Skin is warm and dry. No rashes noted. Psychiatric: Normal mood and affect. Behavior is normal.   ASSESSMENT AND PLAN: 85 year old female here for reassessment of the following:  Iron deficiency anemia / colonic AVMs / gastritis - status post EGD and colonoscopy, I suspect anemia related to combination of gastritis, large colonic AVMs in the setting of anticoagulation, and she also had significant inflammatory changes in the left colon from I suspect diverticulosis/itis.  She was given IV iron.  I repeated her CBC today and her hemoglobin is completely normalized as well as her iron stores which is excellent.  She does not tolerate oral iron so we will need to keep an eye on her hemoglobin moving forward and give her IV iron as needed.  For some reason she does not respond to IV iron in the future, would need to consider ablation of the colonic AVMs although these are quite large and at risk for bleeding with intervention.  Would repeat a CBC in another 2 months or so.  Otherwise continue omeprazole for now, she feels much better on this regimen.  Left lower quadrant pain / diverticulosis - she had significant inflammatory changes in in the sigmoid colon which I suspect is reactive due to diverticulosis/diverticulitis, no obvious dysplasia or malignancy but a lot of edema in the area which made visualization difficult.  She has no leukocytosis, but she has had some ongoing left lower quadrant pain since have seen her.  We will obtain a CT  scan of the abdomen and pelvis to further evaluate (given CKD will hold on IV contrast).  Further recommendations pending the results.  She agreed  Cosmos Cellar, MD [facility]

## 2019-01-29 NOTE — Patient Instructions (Signed)
If you are age 83 or older, your body mass index should be between 23-30. Your Body mass index is 29.78 kg/m. If this is out of the aforementioned range listed, please consider follow up with your Primary Care Provider.  If you are age 1 or younger, your body mass index should be between 19-25. Your Body mass index is 29.78 kg/m. If this is out of the aformentioned range listed, please consider follow up with your Primary Care Provider.   Please go to the lab in the basement of our building to have lab work done as you leave today. Hit "B" for basement when you get on the elevator.  When the doors open the lab is on your left.  We will call you with the results. Thank you.  You have been scheduled for a CT scan of the abdomen and pelvis at [facility] are scheduled on Friday, 02-05-19 at 3:30pm. You should arrive 15 minutes prior to your appointment time for registration. Please follow the written instructions below on the day of your exam:  1) Do not eat anything after 11:30am (4 hours prior to your test) 2) You have been given 2 bottles of oral contrast to drink. The solution may taste better if refrigerated, but do NOT add ice or any other liquid to this solution. Shake well before drinking.    Drink 1 bottle of contrast @ 1:30pm (2 hours prior to your exam)  Drink 1 bottle of contrast @ 2:30pm (1 hour prior to your exam)  You may take any medications as prescribed with a small amount of water, if necessary. If you take any of the following medications: METFORMIN, GLUCOPHAGE, GLUCOVANCE, AVANDAMET, RIOMET, FORTAMET, [place] MET, JANUMET, GLUMETZA or METAGLIP, you MAY be asked to HOLD this medication 48 hours AFTER the exam.  The purpose of you drinking the oral contrast is to aid in the visualization of your intestinal tract. The contrast solution may cause some diarrhea.  Plan on being at [facility] for 30 minutes or longer, depending on the type of exam you are having  performed.  This test typically takes 30-45 minutes to complete.  If you have any questions regarding your exam or if you need to reschedule, you may call the CT department at [phone] between the hours of 8:00 am and 5:00 pm, Monday-Friday.  ________________________________________________________________________  Thank you for entrusting me with your care and for choosing [facility], Dr. Williams Cellar

## 2019-02-01 ENCOUNTER — Other Ambulatory Visit: Payer: Self-pay

## 2019-02-01 DIAGNOSIS — D509 Iron deficiency anemia, unspecified: Secondary | ICD-10-CM

## 2019-02-01 NOTE — Progress Notes (Signed)
Cbc due in January for IDA

## 2019-02-05 ENCOUNTER — Other Ambulatory Visit: Payer: Self-pay

## 2019-02-05 ENCOUNTER — Ambulatory Visit (HOSPITAL_COMMUNITY)
Admission: RE | Admit: 2019-02-05 | Discharge: 2019-02-05 | Disposition: A | Discharge status: Home / Self Care | Payer: Medicare Other | Source: Ambulatory Visit | Attending: Gastroenterology | Admitting: Gastroenterology

## 2019-02-05 DIAGNOSIS — R1032 Left lower quadrant pain: Secondary | ICD-10-CM | POA: Insufficient documentation

## 2019-02-05 NOTE — Progress Notes (Signed)
Carelink Summary Report / Loop Recorder 

## 2019-02-09 ENCOUNTER — Telehealth: Payer: Self-pay | Admitting: Student

## 2019-02-09 NOTE — Telephone Encounter (Signed)
Remote transmission received. Patient has follow-up with T[place] PA 02/10/19.

## 2019-02-09 NOTE — Telephone Encounter (Signed)
Patient states that she has a hard time getting around and will need to bring her granddaughter with her to her appt tomorrow 11/11.  Patient states she early stage of dementia.

## 2019-02-09 NOTE — Telephone Encounter (Signed)
I called the pt to get a manual transmission with her home monitor. Transmission received. Penny Reichmann, Rn reviewed the transmission.   I told her it was okay for her granddaughter to come to the appointment with her because she in the early stages of dementia.

## 2019-02-09 NOTE — Progress Notes (Signed)
PCP:  [name], DO Primary Cardiologist: Minus Breeding, MD Electrophysiologist: None   Penny Miller is a 85 y.o. female with past medical history of CVA, HTN, PAF, and sinus pauses who presents today for routine electrophysiology followup. They are seen for [name].   Pt called office last month with symptoms of fatigue, and HRs as low as in the 40s. She has a history of relatively asymptomatic pauses. Atenolol stopped.   She feels much better. No further fatigue or lightheadedness. BP at home running 120-140 per granddaughter, who checks daily.   DEVICE HISTORY Medtronic LINQ placed 07/2016 for cryptogenic stroke  Past Medical History:  Diagnosis Date  . Allergic rhinitis, cause unspecified   . Anxiety state, unspecified   . Arthritis   . Colon polyp   . Diaphragmatic hernia without mention of obstruction or gangrene   . Diverticulosis of colon (without mention of hemorrhage)   . Female stress incontinence   . HTN (hypertension)   . Insomnia, unspecified   . Interstitial cystitis    surgery 02/01/2013  . Irritable bowel syndrome   . Memory loss   . Other and unspecified hyperlipidemia   . Other premature beats   . Polyneuropathy in diabetes(357.2)   . Postmenopausal bleeding   . Proteinuria   . Rectal ulcer   . Skin cancer   . Stroke ([place]) 2018  . 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 Unspecified essential hypertension   . Viral warts, unspecified    Past Surgical History:  Procedure Laterality Date  . ABDOMINAL HYSTERECTOMY  1977   Dr.Hambright  . APPENDECTOMY  1965  . CATARACT EXTRACTION, BILATERAL  02/10/2006   Dr.Groat   . COLONOSCOPY  07/17/2004   polypectomy  . [place]  . LOOP RECORDER INSERTION N/A 07/31/2016   Procedure: Loop Recorder Insertion;  Surgeon: [name], MD;  Location: [place] [facility];  Service: Cardiovascular;  Laterality:  N/A;  . TEE WITHOUT CARDIOVERSION N/A 07/30/2016   Procedure: TRANSESOPHAGEAL ECHOCARDIOGRAM (TEE);  Surgeon: [name], MD;  Location: [place];  Service: Cardiovascular;  Laterality: N/A;  . [place]  . TRIGGER FINGER RELEASE  05/13/16  . urinary bladder      surgery 02/01/2013 to stretch bladder stem    Current Outpatient Medications  Medication Sig Dispense Refill  . ACCU-CHEK AVIVA PLUS test strip USE TO TEST BLOOD SUGAR TWICE DAILY. 100 each 11  . albuterol (VENTOLIN HFA) 108 (90 Base) MCG/ACT inhaler Inhale 2 puffs into the lungs every 6 (six) hours as needed for wheezing or shortness of breath. 18 g 0  . amLODipine (NORVASC) 2.5 MG tablet Take 1 tablet (2.5 mg total) by mouth daily. 180 tablet 1  . atorvastatin (LIPITOR) 40 MG tablet TAKE 1 TABLET DAILY AT 6 P.M. 90 tablet 3  . citalopram (CELEXA) 10 MG tablet TAKE 1 TABLET DAILY 90 tablet 0  . ELIQUIS 5 MG TABS tablet TAKE 1 TABLET TWICE A DAY 180 tablet 3  . ferrous sulfate 325 (65 FE) MG EC tablet Take 1 tablet (325 mg total) by mouth 2 (two) times daily. 60 tablet 5  . furosemide (LASIX) 20 MG tablet Take 20 mg by mouth daily.    Marland Kitchen gabapentin (NEURONTIN) 300 MG capsule Take 1 capsule (300 mg total) by mouth 3 (three) times daily. 270 capsule 3  . Lancets (  ACCU-CHEK MULTICLIX) lancets Use to test blood sugar twice daily. E11.29 100 each 11  . omeprazole (PRILOSEC) 20 MG capsule Take 1 capsule (20 mg total) by mouth 2 (two) times daily before a meal. 90 capsule 3  . Vitamin D, Ergocalciferol, (DRISDOL) 1.25 MG (50000 UT) CAPS capsule TAKE 1 CAPSULE ONCE WEEKLY FOR VITAMIN D 12 capsule 3   No current facility-administered medications for this visit.     Allergies  Allergen Reactions  . Jardiance [Empagliflozin] Diarrhea  . Metformin And Related Diarrhea  . Ceclor [Cefaclor] Other (See Comments)    Unknown reaction; not recalled  . Codeine Nausea And Vomiting  . Lopid [Gemfibrozil]  Other (See Comments)    Unknown reaction; not recalled  . Metoprolol Nausea And Vomiting  . Morphine And Related Other (See Comments)    Passed out  . Niacin Nausea And Vomiting    Unknown reaction Unknown reaction; not recalled  . Niacin And Related Other (See Comments)    Unknown reaction; not recalled  . Other   . Relafen [Nabumetone] Nausea And Vomiting  . Septra [Sulfamethoxazole-Trimethoprim] Nausea And Vomiting  . Tetracycline Nausea And Vomiting  . Toprol Xl [Metoprolol Tartrate] Nausea And Vomiting  . Adhesive [Tape] Rash and Other (See Comments)    Tears the skin also    Social History   Socioeconomic History  . Marital status: Widowed    Spouse name: Not on file  . Number of children: 1  . Years of education: Not on file  . Highest education level: Not on file  Occupational History  . 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 Financial resource strain: Not hard at all  . Food insecurity    Worry: Never true    Inability: Never true  . Transportation needs    Medical: No    Non-medical: No  Tobacco Use  . Smoking status: Former Smoker    Packs/day: 0.25    Years: 62.00    Pack years: 15.50    Quit date: 04/02/2011    Years since quitting: 7.8  . Smokeless tobacco: Never Used  . Tobacco comment: stopped consistently smoking in 2012 but still smokes occasionally (one pack per week)  Substance and Sexual Activity  . Alcohol use: No  . Drug use: No  . Sexual activity: Never  Lifestyle  . Physical activity    Days per week: 7 days    Minutes per session: 10 min  . Stress: Not at all  Relationships  . Social connections    Talks on phone: More than three times a week    Gets together: More than three times a week    Attends religious service: Never    Active member of club or organization: No    Attends meetings of clubs or organizations: Never    Relationship status: Widowed  . Intimate partner violence    Fear of  current or ex partner: No    Emotionally abused: No    Physically abused: No    Forced sexual activity: No  Other Topics Concern  . Not on file  Social History Narrative   Ophthalmologist-Dr.Gould and Dr.Groat   Podiatrist- Dr.Tuchman   Dermatologist- Dr.Lupton   Urologist- Dr. Lawerance Bach    Lives with her son.     Review of Systems: General: No chills, fever, night sweats or weight changes  Cardiovascular:  No chest pain, dyspnea on exertion, edema, orthopnea, palpitations, paroxysmal nocturnal dyspnea  Dermatological: No rash, lesions or masses Respiratory: No cough, dyspnea Urologic: No hematuria, dysuria Abdominal: No nausea, vomiting, diarrhea, bright red blood per rectum, melena, or hematemesis Neurologic: No visual changes, weakness, changes in mental status All other systems reviewed and are otherwise negative except as noted above.  Physical Exam: Vitals:   02/10/19 0910  BP: (!) 120/56  Weight: 183 lb (83 kg)  Height: 5\' 5"  (1.651 m)    GEN- The patient is well appearing, alert and oriented x 3 today.   HEENT: normocephalic, atraumatic; sclera clear, conjunctiva pink; hearing intact; oropharynx clear; neck supple, no JVP Lymph- no cervical lymphadenopathy Lungs- Clear to ausculation bilaterally, normal work of breathing.  No wheezes, rales, rhonchi Heart- Regular rate and rhythm, no murmurs, rubs or gallops, PMI not laterally displaced GI- soft, non-tender, non-distended, bowel sounds present, no hepatosplenomegaly Extremities- no clubbing, cyanosis, or edema; DP/PT/radial pulses 2+ bilaterally MS- no significant deformity or atrophy Skin- warm and dry, no rash or lesion Psych- euthymic mood, full affect Neuro- strength and sensation are intact  EKG is not ordered today with review of ILR.   Assessment and Plan:  1. Pauses/Fatigue Atenolol stopped She feels much better.  Follow BP. Otherwise, leave off BB for now.   2. Paroxysmal Afib Asymptomatic  CHA2DS2VASC of 8, and on Eliquis.   3. HTN She has room to increase her amlodipine if needed.  They will continue to monitor BP at home daily.   Shirley Friar, PA-C  02/10/19 9:46 AM

## 2019-02-10 ENCOUNTER — Other Ambulatory Visit: Payer: Self-pay

## 2019-02-10 ENCOUNTER — Ambulatory Visit (INDEPENDENT_AMBULATORY_CARE_PROVIDER_SITE_OTHER): Payer: Medicare Other | Admitting: Student

## 2019-02-10 VITALS — BP 120/56 | Ht 65.0 in | Wt 183.0 lb

## 2019-02-10 DIAGNOSIS — R5383 Other fatigue: Secondary | ICD-10-CM

## 2019-02-10 DIAGNOSIS — I480 Paroxysmal atrial fibrillation: Secondary | ICD-10-CM

## 2019-02-10 DIAGNOSIS — I10 Essential (primary) hypertension: Secondary | ICD-10-CM

## 2019-02-10 DIAGNOSIS — I639 Cerebral infarction, unspecified: Secondary | ICD-10-CM | POA: Diagnosis not present

## 2019-02-10 LAB — CUP PACEART INCLINIC DEVICE CHECK
Date Time Interrogation Session: 20201111095136
Implantable Pulse Generator Implant Date: 20180502

## 2019-02-15 DIAGNOSIS — Z85828 Personal history of other malignant neoplasm of skin: Secondary | ICD-10-CM | POA: Diagnosis not present

## 2019-02-15 DIAGNOSIS — L905 Scar conditions and fibrosis of skin: Secondary | ICD-10-CM | POA: Diagnosis not present

## 2019-02-15 DIAGNOSIS — L570 Actinic keratosis: Secondary | ICD-10-CM | POA: Diagnosis not present

## 2019-02-17 ENCOUNTER — Ambulatory Visit (INDEPENDENT_AMBULATORY_CARE_PROVIDER_SITE_OTHER): Payer: Medicare Other | Admitting: *Deleted

## 2019-02-17 DIAGNOSIS — R002 Palpitations: Secondary | ICD-10-CM

## 2019-02-17 DIAGNOSIS — I480 Paroxysmal atrial fibrillation: Secondary | ICD-10-CM | POA: Diagnosis not present

## 2019-02-18 LAB — CUP PACEART REMOTE DEVICE CHECK
Date Time Interrogation Session: 20201118134509
Implantable Pulse Generator Implant Date: 20180502

## 2019-03-17 NOTE — Progress Notes (Signed)
Carelink Summary Report / Loop Recorder 

## 2019-03-22 ENCOUNTER — Ambulatory Visit (INDEPENDENT_AMBULATORY_CARE_PROVIDER_SITE_OTHER): Payer: Medicare Other | Admitting: *Deleted

## 2019-03-22 DIAGNOSIS — I63442 Cerebral infarction due to embolism of left cerebellar artery: Secondary | ICD-10-CM

## 2019-03-23 ENCOUNTER — Telehealth: Payer: Self-pay | Admitting: Gastroenterology

## 2019-03-23 LAB — CUP PACEART REMOTE DEVICE CHECK
Date Time Interrogation Session: 20201221134857
Implantable Pulse Generator Implant Date: 20180502

## 2019-03-23 NOTE — Telephone Encounter (Signed)
Patient has been advised that there should be no interaction between omeprazole and nitrofurantin. She verbalizes understanding.

## 2019-03-23 NOTE — Telephone Encounter (Signed)
Patient called wanting to know if it's ok to take Omeprazole 20mg  and Nitrofurantoin 100mg  at the same time, for she only takes the Nitrofurantoin as needed. If she does not answer she said you can just leave her a detailed message.

## 2019-03-28 ENCOUNTER — Other Ambulatory Visit: Payer: Self-pay | Admitting: Internal Medicine

## 2019-04-06 ENCOUNTER — Telehealth: Payer: Self-pay

## 2019-04-06 NOTE — Telephone Encounter (Signed)
-----   Message from Roetta Sessions, CMA sent at 02/01/2019 11:59 AM EST ----- Regarding: cbc due in January Cbc due in early January for IDA

## 2019-04-06 NOTE — Progress Notes (Signed)
Letter sent to pt to go to the lab for cbc

## 2019-04-06 NOTE — Telephone Encounter (Signed)
Called pt and LM that she needs to go to lab for CBC.  Order is in.

## 2019-04-12 ENCOUNTER — Other Ambulatory Visit (INDEPENDENT_AMBULATORY_CARE_PROVIDER_SITE_OTHER): Payer: Medicare Other

## 2019-04-12 DIAGNOSIS — D509 Iron deficiency anemia, unspecified: Secondary | ICD-10-CM

## 2019-04-12 LAB — CBC WITH DIFFERENTIAL/PLATELET
Basophils Absolute: 0.1 10*3/uL (ref 0.0–0.1)
Basophils Relative: 0.9 % (ref 0.0–3.0)
Eosinophils Absolute: 0.3 10*3/uL (ref 0.0–0.7)
Eosinophils Relative: 3.5 % (ref 0.0–5.0)
HCT: 40.8 % (ref 36.0–46.0)
Hemoglobin: 13.6 g/dL (ref 12.0–15.0)
Lymphocytes Relative: 15.8 % (ref 12.0–46.0)
Lymphs Abs: 1.3 10*3/uL (ref 0.7–4.0)
MCHC: 33.3 g/dL (ref 30.0–36.0)
MCV: 94.4 fl (ref 78.0–100.0)
Monocytes Absolute: 0.9 10*3/uL (ref 0.1–1.0)
Monocytes Relative: 11.3 % (ref 3.0–12.0)
Neutro Abs: 5.4 10*3/uL (ref 1.4–7.7)
Neutrophils Relative %: 68.5 % (ref 43.0–77.0)
Platelets: 164 10*3/uL (ref 150.0–400.0)
RBC: 4.32 Mil/uL (ref 3.87–5.11)
RDW: 14.9 % (ref 11.5–15.5)
WBC: 7.9 10*3/uL (ref 4.0–10.5)

## 2019-04-13 ENCOUNTER — Ambulatory Visit (INDEPENDENT_AMBULATORY_CARE_PROVIDER_SITE_OTHER): Payer: Medicare Other | Admitting: Podiatry

## 2019-04-13 ENCOUNTER — Other Ambulatory Visit: Payer: Self-pay

## 2019-04-13 DIAGNOSIS — E1142 Type 2 diabetes mellitus with diabetic polyneuropathy: Secondary | ICD-10-CM

## 2019-04-13 DIAGNOSIS — M79675 Pain in left toe(s): Secondary | ICD-10-CM | POA: Diagnosis not present

## 2019-04-13 DIAGNOSIS — M79674 Pain in right toe(s): Secondary | ICD-10-CM | POA: Diagnosis not present

## 2019-04-13 DIAGNOSIS — B351 Tinea unguium: Secondary | ICD-10-CM

## 2019-04-13 NOTE — Patient Instructions (Signed)
Diabetes Mellitus and Foot Care Foot care is an important part of your health, especially when you have diabetes. Diabetes may cause you to have problems because of poor blood flow (circulation) to your feet and legs, which can cause your skin to:  Become thinner and drier.  Break more easily.  Heal more slowly.  Peel and crack. You may also have nerve damage (neuropathy) in your legs and feet, causing decreased feeling in them. This means that you may not notice minor injuries to your feet that could lead to more serious problems. Noticing and addressing any potential problems early is the best way to prevent future foot problems. How to care for your feet Foot hygiene  Wash your feet daily with warm water and mild soap. Do not use hot water. Then, pat your feet and the areas between your toes until they are completely dry. Do not soak your feet as this can dry your skin.  Trim your toenails straight across. Do not dig under them or around the cuticle. File the edges of your nails with an emery board or nail file.  Apply a moisturizing lotion or petroleum jelly to the skin on your feet and to dry, brittle toenails. Use lotion that does not contain alcohol and is unscented. Do not apply lotion between your toes. Shoes and socks  Wear clean socks or stockings every day. Make sure they are not too tight. Do not wear knee-high stockings since they may decrease blood flow to your legs.  Wear shoes that fit properly and have enough cushioning. Always look in your shoes before you put them on to be sure there are no objects inside.  To break in new shoes, wear them for just a few hours a day. This prevents injuries on your feet. Wounds, scrapes, corns, and calluses  Check your feet daily for blisters, cuts, bruises, sores, and redness. If you cannot see the bottom of your feet, use a mirror or ask someone for help.  Do not cut corns or calluses or try to remove them with medicine.  If you  find a minor scrape, cut, or break in the skin on your feet, keep it and the skin around it clean and dry. You may clean these areas with mild soap and water. Do not clean the area with peroxide, alcohol, or iodine.  If you have a wound, scrape, corn, or callus on your foot, look at it several times a day to make sure it is healing and not infected. Check for: ? Redness, swelling, or pain. ? Fluid or blood. ? Warmth. ? Pus or a bad smell. General instructions  Do not cross your legs. This may decrease blood flow to your feet.  Do not use heating pads or hot water bottles on your feet. They may burn your skin. If you have lost feeling in your feet or legs, you may not know this is happening until it is too late.  Protect your feet from hot and cold by wearing shoes, such as at the beach or on hot pavement.  Schedule a complete foot exam at least once a year (annually) or more often if you have foot problems. If you have foot problems, report any cuts, sores, or bruises to your health care provider immediately. Contact a health care provider if:  You have a medical condition that increases your risk of infection and you have any cuts, sores, or bruises on your feet.  You have an injury that is not   healing.  You have redness on your legs or feet.  You feel burning or tingling in your legs or feet.  You have pain or cramps in your legs and feet.  Your legs or feet are numb.  Your feet always feel cold.  You have pain around a toenail. Get help right away if:  You have a wound, scrape, corn, or callus on your foot and: ? You have pain, swelling, or redness that gets worse. ? You have fluid or blood coming from the wound, scrape, corn, or callus. ? Your wound, scrape, corn, or callus feels warm to the touch. ? You have pus or a bad smell coming from the wound, scrape, corn, or callus. ? You have a fever. ? You have a red line going up your leg. Summary  Check your feet every day  for cuts, sores, red spots, swelling, and blisters.  Moisturize feet and legs daily.  Wear shoes that fit properly and have enough cushioning.  If you have foot problems, report any cuts, sores, or bruises to your health care provider immediately.  Schedule a complete foot exam at least once a year (annually) or more often if you have foot problems. This information is not intended to replace advice given to you by your health care provider. Make sure you discuss any questions you have with your health care provider. Document Revised: 12/09/2018 Document Reviewed: 04/19/2016 Elsevier Patient Education  2020 [employer]  

## 2019-04-18 ENCOUNTER — Encounter: Payer: Self-pay | Admitting: Podiatry

## 2019-04-18 NOTE — Progress Notes (Signed)
Subjective: Penny Miller presents today for preventative diabetic foot. Patient is seen for follow up of painful, mycotic toenails which interfere with comfortable ambulation when wearing enclosed shoe gear. Pain is relieved with periodic professional debridement.  She has h/o diabetic neuropathy managed with gabapentin.   PCP is Dr. Hollace Kinnier.  She voices no new pedal concerns on today's visit.  Medications reviewed in chart.  Allergies  Allergen Reactions  . Jardiance [Empagliflozin] Diarrhea  . Metformin And Related Diarrhea  . Ceclor [Cefaclor] Other (See Comments)    Unknown reaction; not recalled  . Codeine Nausea And Vomiting  . Lopid [Gemfibrozil] Other (See Comments)    Unknown reaction; not recalled  . Metoprolol Nausea And Vomiting  . Morphine And Related Other (See Comments)    Passed out  . Niacin Nausea And Vomiting    Unknown reaction Unknown reaction; not recalled  . Niacin And Related Other (See Comments)    Unknown reaction; not recalled  . Other   . Relafen [Nabumetone] Nausea And Vomiting  . Septra [Sulfamethoxazole-Trimethoprim] Nausea And Vomiting  . Tetracycline Nausea And Vomiting  . Toprol Xl [Metoprolol Tartrate] Nausea And Vomiting  . Adhesive [Tape] Rash and Other (See Comments)    Tears the skin also   Objective: There were no vitals filed for this visit.  Vascular Examination: Capillary refill time to digits <4 seconds b/l.   Dorsalis pedis present b/l.  Posterior tibial pulses present b/l.  Digital hair absent b/l.   Skin temperature gradient WNL b/l.   Dermatological Examination: Skin is thin and atrophic b/l.  Toenails 1-5 b/l discolored, thick, dystrophic with subungual debris and pain with palpation to nailbeds due to thickness of nails.  Musculoskeletal: Muscle strength 5/5 b/l to all LE muscle groups.  No gross bony deformities.  No pain, crepitus or joint limitation with passive/active ROM b/l.  Neurological  Examination: Protective sensation intact 5/5 b/l with 10 gram monofilament.  Vibratory sensation intact bilaterally.   Assessment: 1. Painful onychomycosis toenails 1-5 b/l  Plan: 1. Continue diabetic foot care principles. Literature dispensed on today. 2. Toenails 1-5 b/l were debrided in length and girth without iatrogenic bleeding. 3. Patient to continue soft, supportive shoe gear. 4. Patient to report any pedal injuries to medical professional. 5. Follow up 3 months.  6. Patient/POA to call should there be a concern in the interim.

## 2019-04-20 ENCOUNTER — Ambulatory Visit (INDEPENDENT_AMBULATORY_CARE_PROVIDER_SITE_OTHER): Payer: Medicare Other | Admitting: Internal Medicine

## 2019-04-20 ENCOUNTER — Other Ambulatory Visit: Payer: Self-pay

## 2019-04-20 ENCOUNTER — Encounter: Payer: Self-pay | Admitting: Internal Medicine

## 2019-04-20 VITALS — BP 126/64 | HR 67 | Ht 66.0 in | Wt 180.6 lb

## 2019-04-20 DIAGNOSIS — I480 Paroxysmal atrial fibrillation: Secondary | ICD-10-CM | POA: Diagnosis not present

## 2019-04-20 DIAGNOSIS — I10 Essential (primary) hypertension: Secondary | ICD-10-CM

## 2019-04-20 LAB — CUP PACEART INCLINIC DEVICE CHECK
Date Time Interrogation Session: 20210119130900
Implantable Pulse Generator Implant Date: 20180502

## 2019-04-20 NOTE — Patient Instructions (Signed)
Medication Instructions:  °Your physician recommends that you continue on your current medications as directed. Please refer to the Current Medication list given to you today. ° °Labwork: °None ordered. ° °Testing/Procedures: °None ordered. ° °Follow-Up: °Your physician wants you to follow-up in: one year with Dr. Taylor.   You will receive a reminder letter in the mail two months in advance. If you don't receive a letter, please call our office to schedule the follow-up appointment. ° °Remote monthly monitoring is used to monitor your loop recorder. ° °Any Other Special Instructions Will Be Listed Below (If Applicable). ° °If you need a refill on your cardiac medications before your next appointment, please call your pharmacy.  ° °

## 2019-04-20 NOTE — Progress Notes (Signed)
HPI Penny Miller returns today for followup. She is a pleasant 86 yo woman with a h/o cryptogenic stroke who is s/p ILR who has been found to have PAF. She also has sinus pauses but no frank syncope. She has class 2 dyspnea, and no chest pain or edema. After her pauses were detected on her ILR, she had her atenolol stopped.  Allergies  Allergen Reactions  . Jardiance [Empagliflozin] Diarrhea  . Metformin And Related Diarrhea  . Ceclor [Cefaclor] Other (See Comments)    Unknown reaction; not recalled  . Codeine Nausea And Vomiting  . Lopid [Gemfibrozil] Other (See Comments)    Unknown reaction; not recalled  . Metoprolol Nausea And Vomiting  . Morphine And Related Other (See Comments)    Passed out  . Niacin Nausea And Vomiting    Unknown reaction Unknown reaction; not recalled  . Niacin And Related Other (See Comments)    Unknown reaction; not recalled  . Other   . Relafen [Nabumetone] Nausea And Vomiting  . Septra [Sulfamethoxazole-Trimethoprim] Nausea And Vomiting  . Tetracycline Nausea And Vomiting  . Toprol Xl [Metoprolol Tartrate] Nausea And Vomiting  . Adhesive [Tape] Rash and Other (See Comments)    Tears the skin also     Current Outpatient Medications  Medication Sig Dispense Refill  . ACCU-CHEK AVIVA PLUS test strip USE TO TEST BLOOD SUGAR TWICE DAILY. 100 each 11  . albuterol (VENTOLIN HFA) 108 (90 Base) MCG/ACT inhaler Inhale 2 puffs into the lungs every 6 (six) hours as needed for wheezing or shortness of breath. 18 g 0  . alum & mag hydroxide-simeth (MAALOX/MYLANTA) 200-200-20 MG/5ML suspension Take by mouth.    Marland Kitchen amLODipine (NORVASC) 2.5 MG tablet Take 1 tablet (2.5 mg total) by mouth daily. 180 tablet 1  . atorvastatin (LIPITOR) 40 MG tablet TAKE 1 TABLET DAILY AT 6 P.M. 90 tablet 3  . citalopram (CELEXA) 10 MG tablet TAKE 1 TABLET DAILY 90 tablet 1  . ELIQUIS 5 MG TABS tablet TAKE 1 TABLET TWICE A DAY 180 tablet 3  . ferrous sulfate 325 (65 FE) MG EC  tablet Take 1 tablet (325 mg total) by mouth 2 (two) times daily. 60 tablet 5  . furosemide (LASIX) 20 MG tablet Take 20 mg by mouth daily.    Marland Kitchen gabapentin (NEURONTIN) 300 MG capsule Take 1 capsule (300 mg total) by mouth 3 (three) times daily. 270 capsule 3  . Lancets (ACCU-CHEK MULTICLIX) lancets Use to test blood sugar twice daily. E11.29 100 each 11  . omeprazole (PRILOSEC) 20 MG capsule Take 1 capsule (20 mg total) by mouth 2 (two) times daily before a meal. 90 capsule 3  . Vitamin D, Ergocalciferol, (DRISDOL) 1.25 MG (50000 UT) CAPS capsule TAKE 1 CAPSULE ONCE WEEKLY FOR VITAMIN D 12 capsule 3   No current facility-administered medications for this visit.     Past Medical History:  Diagnosis Date  . Allergic rhinitis, cause unspecified   . Anxiety state, unspecified   . Arthritis   . Colon polyp   . Diaphragmatic hernia without mention of obstruction or gangrene   . Diverticulosis of colon (without mention of hemorrhage)   . Female stress incontinence   . HTN (hypertension)   . Insomnia, unspecified   . Interstitial cystitis    surgery 02/01/2013  . Irritable bowel syndrome   . Memory loss   . Other and unspecified hyperlipidemia   . Other premature beats   . Polyneuropathy in diabetes(357.2)   .  Postmenopausal bleeding   . Proteinuria   . Rectal ulcer   . Skin cancer   . Stroke ([place]) 2018  . 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 Unspecified essential hypertension   . Viral warts, unspecified     ROS:   All systems reviewed and negative except as noted in the HPI.   Past Surgical History:  Procedure Laterality Date  . ABDOMINAL HYSTERECTOMY  1977   Dr.Hambright  . APPENDECTOMY  1965  . CATARACT EXTRACTION, BILATERAL  02/10/2006   Dr.Groat   . COLONOSCOPY  07/17/2004   polypectomy  . [place]  . LOOP RECORDER INSERTION N/A 07/31/2016   Procedure: Loop  Recorder Insertion;  Surgeon: [name], MD;  Location: [facility];  Service: Cardiovascular;  Laterality: N/A;  . TEE WITHOUT CARDIOVERSION N/A 07/30/2016   Procedure: TRANSESOPHAGEAL ECHOCARDIOGRAM (TEE);  Surgeon: [name], MD;  Location: [facility];  Service: Cardiovascular;  Laterality: N/A;  . [place]  . TRIGGER FINGER RELEASE  05/13/16  . urinary bladder      surgery 02/01/2013 to stretch bladder stem     Family History  Problem Relation Age of Onset  . Pancreatic cancer Mother 47  . Heart attack Father   . Bladder Cancer Father   . Hypertension Sister   . Diabetes Sister   . Diabetes Brother   . Kidney cancer Brother   . Hypertension Brother   . Diabetes Brother   . Heart disease Brother 46       Myocardial Infarction      Social History   Socioeconomic History  . Marital status: Widowed    Spouse name: Not on file  . Number of children: 1  . Years of education: Not on file  . Highest education level: Not on file  Occupational History  . 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 Smoking status: Former Smoker    Packs/day: 0.25    Years: 62.00    Pack years: 15.50    Quit date: 04/02/2011    Years since quitting: 8.0  . Smokeless tobacco: Never Used  . Tobacco comment: stopped consistently smoking in 2012 but still smokes occasionally (one pack per week)  Substance and Sexual Activity  . Alcohol use: No  . Drug use: No  . Sexual activity: Never  Other Topics Concern  . Not on file  Social History Narrative   Ophthalmologist-Dr.Gould and Dr.Groat   Podiatrist- Dr.Tuchman   Dermatologist- Dr.Lupton   Urologist- Dr. Lawerance Bach    Lives with her son.    Social Determinants of Health   Financial Resource Strain:   . Difficulty of Paying Living Expenses: Not on file  Food Insecurity:   . Worried About Charity fundraiser in the Last Year: Not on file  . Ran Out of Food in  the Last Year: Not on file  Transportation Needs:   . Lack of Transportation (Medical): Not on file  . Lack of Transportation (Non-Medical): Not on file  Physical Activity:   . Days of Exercise per Week: Not on file  . Minutes of Exercise per Session: Not on file  Stress:   . Feeling of Stress : Not on file  Social Connections:   . Frequency of Communication with Friends and Family: Not on file  . Frequency  of Social Gatherings with Friends and Family: Not on file  . Attends Religious Services: Not on file  . Active Member of Clubs or Organizations: Not on file  . Attends Archivist Meetings: Not on file  . Marital Status: Not on file  Intimate Partner Violence:   . Fear of Current or Ex-Partner: Not on file  . Emotionally Abused: Not on file  . Physically Abused: Not on file  . Sexually Abused: Not on file     BP 126/64   Pulse 67   Ht 5\' 6"  (1.676 m)   Wt 180 lb 9.6 oz (81.9 kg)   SpO2 97%   BMI 29.15 kg/m   Physical Exam:  Well appearing NAD HEENT: Unremarkable Neck:  No JVD, no thyromegally Lymphatics:  No adenopathy Back:  No CVA tenderness Lungs:  Clear with no wheezes HEART:  Regular rate rhythm, no murmurs, no rubs, no clicks Abd:  soft, positive bowel sounds, no organomegally, no rebound, no guarding Ext:  2 plus pulses, no edema, no cyanosis, no clubbing Skin:  No rashes no nodules Neuro:  CN II through XII intact, motor grossly intact  EKG nsr with first degree AV block  DEVICE  Normal device function.  See PaceArt for details.   Assess/Plan: 1. PAF - she is mostly maintaining NSR. No change in her meds.  2. Sinus node dysfunction - she is now minimally symptomatic off of atenolol.  3. Stroke - she has had no recurrent spells since starting Eliquis. She has not had any bleeding episodes. Minimal residual deficits.   Mikle Bosworth.D.

## 2019-04-26 ENCOUNTER — Ambulatory Visit (INDEPENDENT_AMBULATORY_CARE_PROVIDER_SITE_OTHER): Payer: Medicare Other | Admitting: *Deleted

## 2019-04-26 DIAGNOSIS — I63442 Cerebral infarction due to embolism of left cerebellar artery: Secondary | ICD-10-CM | POA: Diagnosis not present

## 2019-04-26 LAB — CUP PACEART REMOTE DEVICE CHECK
Date Time Interrogation Session: 20210124231559
Implantable Pulse Generator Implant Date: 20180502

## 2019-05-11 ENCOUNTER — Telehealth: Payer: Self-pay | Admitting: *Deleted

## 2019-05-11 NOTE — Telephone Encounter (Signed)
Patient called and stated that she is scheduled to get the Covid Injection on Friday. They told her to call her PCP to be sure it was ok for her to receive with her medical history and medications.  Please Advise.

## 2019-05-11 NOTE — Telephone Encounter (Signed)
Patient notified and agreed.  

## 2019-05-11 NOTE — Telephone Encounter (Signed)
Yes, she may get her covid vaccine as planned on Friday.  I'm glad she is getting it.

## 2019-05-17 ENCOUNTER — Ambulatory Visit (INDEPENDENT_AMBULATORY_CARE_PROVIDER_SITE_OTHER): Payer: Medicare Other | Admitting: Family

## 2019-05-17 ENCOUNTER — Encounter: Payer: Self-pay | Admitting: Family

## 2019-05-17 ENCOUNTER — Other Ambulatory Visit: Payer: Self-pay

## 2019-05-17 ENCOUNTER — Telehealth: Payer: Self-pay | Admitting: Gastroenterology

## 2019-05-17 VITALS — HR 72 | Temp 96.1°F | Ht 66.0 in

## 2019-05-17 DIAGNOSIS — B029 Zoster without complications: Secondary | ICD-10-CM

## 2019-05-17 DIAGNOSIS — I63442 Cerebral infarction due to embolism of left cerebellar artery: Secondary | ICD-10-CM | POA: Diagnosis not present

## 2019-05-17 MED ORDER — VALACYCLOVIR HCL 1 G PO TABS: 1000.0000 mg | ORAL_TABLET | Freq: Two times a day (BID) | ORAL | 0 refills | Status: AC

## 2019-05-17 NOTE — Progress Notes (Signed)
This service is provided via telemedicine  Vital signs were collected/recorded during the telehealth visit and documented by [place].D RMA.    Location of patient (ex: home, work):  Home  Patient consents to a telephone visit:  Yes  Location of the provider (ex: office, home):   Name of any referring provider:   [facility].  Names of all persons participating in the telemedicine service and their role in the encounter:  Patient, Daughter Tiffant Percell Miller, Heriberto [place], [place], Marlowe Sax, NP.   Time spent on call:  8 minutes spent on phone with Medical Assistant.    Provider: Eliel Dudding FNP-C  [name], DO  Patient Care Team: [name], DO as PCP - General (Geriatric Medicine) Minus Breeding, MD as PCP - Cardiology (Cardiology) Ulice Bold, MD as Referring Physician (Dermatology) Love, Alyson Locket, MD as Consulting Physician (Neurology) Sharyne Peach, MD as Consulting Physician (Ophthalmology) Gean Birchwood, DPM as Consulting Physician (Podiatry) Ronald Lobo, MD as Consulting Physician (Gastroenterology) Selinda Orion, MD (Inactive) as Consulting Physician (Gynecology) Druscilla Brownie, MD as Consulting Physician (Dermatology) Clent Jacks, MD as Consulting Physician (Ophthalmology) Myrlene Broker, MD as Attending Physician (Urology) Minus Breeding, MD as Consulting Physician (Cardiology)  Extended Emergency Contact Information Primary Emergency Contact: Streiff,[name] Address: [address], [place] Phone: [phone] Relation: Son Secondary Emergency Contact: Murtis Sink [place] Phone: [phone] Relation: Granddaughter  Code Status: Full Code  Goals of care: Advanced Directive information Advanced Directives 05/17/2019  Does Patient Have a Medical Advance Directive? No  Type of Advance Directive -  Does patient want to make  changes to medical advance directive? -  Copy of [place] in Chart? -  Would patient like information on creating a medical advance directive? -     Chief Complaint  Patient presents with  . Acute Visit    Blisters on Left Arm, Left Wrist, Left Neck, Fatigue and Weakness    HPI:  Pt is a 86 y.o. female seen today for an acute visit for evaluation of  Blisters on left Arm, left wrist, left neck, fatigue and weakness since 05/15/2019.Grand daughter [name] present during visit assisted to hold video camera to rash areas.Spoke to patient on Mychart video on grand daughter's iphone [phone] she describes rash as painful, burning and itching.she denies any fever or chills.States has a poor appetite.No changes in laundry soap or detergent or lotion.No recall of insect bites.Has had shingles in the past but states was not as bad as this ones.    Past Medical History:  Diagnosis Date  . Allergic rhinitis, cause unspecified   . Anxiety state, unspecified   . Arthritis   . Colon polyp   . Diaphragmatic hernia without mention of obstruction or gangrene   . Diverticulosis of colon (without mention of hemorrhage)   . Female stress incontinence   . HTN (hypertension)   . Insomnia, unspecified   . Interstitial cystitis    surgery 02/01/2013  . Irritable bowel syndrome   . Memory loss   . Other and unspecified hyperlipidemia   . Other premature beats   . Polyneuropathy in diabetes(357.2)   . Postmenopausal bleeding   . Proteinuria   . Rectal ulcer   . Skin cancer   . Stroke ([place]) 2018  . Type II or unspecified type diabetes mellitus with neurological manifestations, not stated  as uncontrolled(250.60)   . Unspecified essential hypertension   . Viral warts, unspecified    Past Surgical History:  Procedure Laterality Date  . ABDOMINAL HYSTERECTOMY  1977   Dr.Hambright  . APPENDECTOMY  1965  . CATARACT EXTRACTION, BILATERAL  02/10/2006   Dr.Groat   . COLONOSCOPY   07/17/2004   polypectomy  . [place]  . LOOP RECORDER INSERTION N/A 07/31/2016   Procedure: Loop Recorder Insertion;  Surgeon: [name], MD;  Location: [facility];  Service: Cardiovascular;  Laterality: N/A;  . TEE WITHOUT CARDIOVERSION N/A 07/30/2016   Procedure: TRANSESOPHAGEAL ECHOCARDIOGRAM (TEE);  Surgeon: [name], MD;  Location: [facility];  Service: Cardiovascular;  Laterality: N/A;  . [place]  . TRIGGER FINGER RELEASE  05/13/16  . urinary bladder      surgery 02/01/2013 to stretch bladder stem    Allergies  Allergen Reactions  . Jardiance [Empagliflozin] Diarrhea  . Metformin And Related Diarrhea  . Ceclor [Cefaclor] Other (See Comments)    Unknown reaction; not recalled  . Codeine Nausea And Vomiting  . Lopid [Gemfibrozil] Other (See Comments)    Unknown reaction; not recalled  . Metoprolol Nausea And Vomiting  . Morphine And Related Other (See Comments)    Passed out  . Niacin Nausea And Vomiting    Unknown reaction Unknown reaction; not recalled  . Niacin And Related Other (See Comments)    Unknown reaction; not recalled  . Other   . Relafen [Nabumetone] Nausea And Vomiting  . Septra [Sulfamethoxazole-Trimethoprim] Nausea And Vomiting  . Tetracycline Nausea And Vomiting  . Toprol Xl [Metoprolol Tartrate] Nausea And Vomiting  . Adhesive [Tape] Rash and Other (See Comments)    Tears the skin also    Outpatient Encounter Medications as of 05/17/2019  Medication Sig  . ACCU-CHEK AVIVA PLUS test strip USE TO TEST BLOOD SUGAR TWICE DAILY.  Marland Kitchen albuterol (VENTOLIN HFA) 108 (90 Base) MCG/ACT inhaler Inhale 2 puffs into the lungs every 6 (six) hours as needed for wheezing or shortness of breath.  Marland Kitchen amLODipine (NORVASC) 2.5 MG tablet Take 1 tablet (2.5 mg total) by mouth daily.  Marland Kitchen atorvastatin (LIPITOR) 40 MG tablet TAKE 1 TABLET DAILY AT 6 P.M.  . citalopram (CELEXA) 10 MG tablet TAKE 1 TABLET DAILY    . ELIQUIS 5 MG TABS tablet TAKE 1 TABLET TWICE A DAY  . ferrous sulfate 325 (65 FE) MG EC tablet Take 1 tablet (325 mg total) by mouth 2 (two) times daily.  . furosemide (LASIX) 20 MG tablet Take 20 mg by mouth daily.  Marland Kitchen gabapentin (NEURONTIN) 300 MG capsule Take 1 capsule (300 mg total) by mouth 3 (three) times daily.  . Lancets (ACCU-CHEK MULTICLIX) lancets Use to test blood sugar twice daily. E11.29  . Vitamin D, Ergocalciferol, (DRISDOL) 1.25 MG (50000 UT) CAPS capsule TAKE 1 CAPSULE ONCE WEEKLY FOR VITAMIN D  . atenolol (TENORMIN) 25 MG tablet Take 1 tablet by mouth 2 (two) times daily.  . pioglitazone (ACTOS) 15 MG tablet Take 15 mg by mouth daily.  . valACYclovir (VALTREX) 1000 MG tablet Take 1 tablet (1,000 mg total) by mouth 2 (two) times daily for 7 days.  . [DISCONTINUED] alum & mag hydroxide-simeth (MAALOX/MYLANTA) 200-200-20 MG/5ML suspension Take by mouth.  . [DISCONTINUED] omeprazole (PRILOSEC) 20 MG capsule Take 1 capsule (20 mg total) by mouth 2 (two) times daily before a meal.   No facility-administered encounter medications  on file as of 05/17/2019.    Review of Systems  HENT: Negative for congestion, rhinorrhea, sinus pressure, sinus pain, sneezing, sore throat and trouble swallowing.   Respiratory: Negative for cough, chest tightness, shortness of breath and wheezing.     Immunization History  Administered Date(s) Administered  . Influenza, High Dose Seasonal PF 12/31/2016, 12/15/2017  . Influenza,inj,Quad PF,6+ Mos 12/15/2012, 12/21/2013, 02/28/2015, 02/02/2016  . Influenza-Unspecified 01/22/2012  . Pneumococcal Conjugate-13 05/07/2016  . Pneumococcal-Unspecified 01/24/1995  . Td 09/14/2008   Pertinent  Health Maintenance Due  Topic Date Due  . PNA vac Low Risk Adult (2 of 2 - PPSV23) 05/07/2017  . OPHTHALMOLOGY EXAM  09/20/2018  . INFLUENZA VACCINE  10/31/2018  . FOOT EXAM  12/16/2018  . URINE MICROALBUMIN  12/17/2018  . HEMOGLOBIN A1C  05/05/2019  . DEXA  SCAN  Completed   Fall Risk  07/24/2018 03/05/2018 02/13/2018 12/15/2017 10/01/2017  Falls in the past year? 0 0 0 No No  Number falls in past yr: 0 0 - - -  Comment - - - - -  Injury with Fall? 0 0 - - -  Comment - - - - -  Risk for fall due to : - - - - -  Risk for fall due to: Comment - - - - -  Follow up - - - - -    Vitals:   05/17/19 1013  Pulse: 72  Temp: (!) 96.1 F (35.6 C)  SpO2: 95%  Height: 5\' 6"  (1.676 m)   Body mass index is 29.15 kg/m. Physical Exam Vitals reviewed.  Constitutional:      General: She is not in acute distress.    Appearance: She is not ill-appearing.  HENT:     Nose: No congestion or rhinorrhea.  Eyes:     General: No scleral icterus.       Right eye: No discharge.        Left eye: No discharge.     Conjunctiva/sclera: Conjunctivae normal.     Pupils: Pupils are equal, round, and reactive to light.  Pulmonary:     Effort: Pulmonary effort is normal. No respiratory distress.  Musculoskeletal:        General: No swelling or tenderness. Normal range of motion.     Right lower leg: No edema.     Left lower leg: No edema.  Skin:    Findings: No bruising.     Comments: Left arm,left upper back and neck area multiple vesicles on red base following same dermatone.No signs of infection noted.Attempts to scratch rash during visit.   Neurological:     Mental Status: She is alert and oriented to person, place, and time.  Psychiatric:        Mood and Affect: Mood normal.        Behavior: Behavior normal.        Thought Content: Thought content normal.        Judgment: Judgment normal.    Labs reviewed: Recent Labs    11/02/18 1408 11/11/18 0846  NA 138 136  K 5.2 4.9  CL 105 99  CO2 27 21  GLUCOSE 122 194*  BUN 26* 25  CREATININE 1.40* 1.61*  CALCIUM 9.8 9.8   Recent Labs    11/02/18 1408  AST 14  ALT 15  BILITOT 0.4  PROT 6.5   Recent Labs    12/16/18 1706 01/29/19 1535 04/12/19 1115  WBC 7.7 9.4 7.9  NEUTROABS 5.8 6.4  5.4  HGB 9.8* 12.7 13.6  HCT 30.7* 38.3 40.8  MCV 83.9 89.1 94.4  PLT 189.0 166.0 164.0   Lab Results  Component Value Date   TSH 4.432 07/29/2016   Lab Results  Component Value Date   HGBA1C 7.2 (H) 11/02/2018   Lab Results  Component Value Date   CHOL 135 03/17/2017   HDL 40 (L) 03/17/2017   LDLCALC 69 03/17/2017   TRIG 179 (H) 03/17/2017   CHOLHDL 3.4 03/17/2017    Significant Diagnostic Results in last 30 days:  CUP PACEART INCLINIC DEVICE CHECK  Result Date: 04/20/2019 Loop check in clinic. Battery status: good. R-waves 0.36 mV. 0 symptom episodes, 0 tachy episodes, 5 pause episodes (nocturnal),  brady episodes. 29 AF episodes (0.7% burden). Patient on Eliquis Monthly summary reports and ROV with GT in 1 year.Lavenia Atlas, BSN, RN  CUP PACEART REMOTE DEVICE CHECK  Result Date: 04/26/2019 Care[facility] summary report received. Battery status OK. Normal device function. No new symptom episodes, tachy episodes, brady, or pause episodes. No new AF episodes. Monthly summary reports and ROV/PRN   Assessment/Plan 1. Herpes zoster infection of thoracic region Afebrile.Left arm,left upper back and neck area multiple vesicles on red base following same dermatone.No signs of infection noted.Attempts to scratch rash during visit.Reports pain,burning and itchy rash consistent with shingles. - Advised to avoid scratching rash. - Apply cold wet wash cloth to rash to relief itch. - May take tylenol 500 mg tablet one by mouth every 8 hours as needed for pain  - continue on Gabapentin 300 mg capsule one by mouth three times daily  -  Take Valcyclovir 1 gram one by mouth twice daily x 7 days.side effects discussed.Verbalized understanding.  - Advised to notify provider or go to ED if rash spreads to face area or signs of infection like redness,strong odor or drainage from rash.  - valACYclovir (VALTREX) 1000 MG tablet; Take 1 tablet (1,000 mg total) by mouth 2 (two) times daily for 7 days.   Dispense: 14 tablet; Refill: 0 - will follow up in 72 hours to re-evaluate rash on Video or office visit. - Education information provided on AVS   Family/ staff Communication: Reviewed plan of care with patient and Rozanna Boer daughter [name] verbalized understanding.   Labs/tests ordered: None   Next Appointment: 05/19/2019 for evaluation of rash on left arm and left upper back and neck area.   Spent  From 1:32 Pm  - 1:48 Pm  (16) minutes of MyChart Video face to face with patient   Sandrea Hughs, NP

## 2019-05-17 NOTE — Telephone Encounter (Signed)
Pt called to inform Dr. Havery Moros that she got shingles and feels very sick. She cancelled her appt for tomorrow 05/18/19 and will call back to r/s.

## 2019-05-17 NOTE — Patient Instructions (Addendum)
1.Take Valcyclovir 1 gram one by mouth twice daily x 7 days. 2. Avoid scratching rash  3. Apply cold wet wash cloth to rash to relief itch. 4.Notify provider or go to ED if rash spreads to face area or signs of infection like redness,strong odor or drainage from rash.   Shingles  Shingles is an infection. It gives you a painful skin rash and blisters that have fluid in them. Shingles is caused by the same germ (virus) that causes chickenpox. Shingles only happens in people who:  Have had chickenpox.  Have been given a shot of medicine (vaccine) to protect against chickenpox. Shingles is rare in this group. The first symptoms of shingles may be itching, tingling, or pain in an area on your skin. A rash will show on your skin a few days or weeks later. The rash is likely to be on one side of your body. The rash usually has a shape like a belt or a band. Over time, the rash turns into fluid-filled blisters. The blisters will break open, change into scabs, and dry up. Medicines may:  Help with pain and itching.  Help you get better sooner.  Help to prevent long-term problems. Follow these instructions at home: Medicines  Take over-the-counter and prescription medicines only as told by your doctor.  Put on an anti-itch cream or numbing cream where you have a rash, blisters, or scabs. Do this as told by your doctor. Helping with itching and discomfort   Put cold, wet cloths (cold compresses) on the area of the rash or blisters as told by your doctor.  Cool baths can help you feel better. Try adding baking soda or dry oatmeal to the water to lessen itching. Do not bathe in hot water. Blister and rash care  Keep your rash covered with a loose bandage (dressing).  Wear loose clothing that does not rub on your rash.  Keep your rash and blisters clean. To do this, wash the area with mild soap and cool water as told by your doctor.  Check your rash every day for signs of infection. Check  for: ? More redness, swelling, or pain. ? Fluid or blood. ? Warmth. ? Pus or a bad smell.  Do not scratch your rash. Do not pick at your blisters. To help you to not scratch: ? Keep your fingernails clean and cut short. ? Wear gloves or mittens when you sleep, if scratching is a problem. General instructions  Rest as told by your doctor.  Keep all follow-up visits as told by your doctor. This is important.  Wash your hands often with soap and water. If soap and water are not available, use hand sanitizer. Doing this lowers your chance of getting a skin infection caused by germs (bacteria).  Your infection can cause chickenpox in people who have never had chickenpox or never got a shot of chickenpox vaccine. If you have blisters that did not change into scabs yet, try not to touch other people or be around other people, especially: ? Babies. ? Pregnant women. ? Children who have areas of red, itchy, or rough skin (eczema). ? Very old people who have transplants. ? People who have a long-term (chronic) sickness, like cancer or AIDS. Contact a doctor if:  Your pain does not get better with medicine.  Your pain does not get better after the rash heals.  You have any signs of infection in the rash area. These signs include: ? More redness, swelling, or pain around  the rash. ? Fluid or blood coming from the rash. ? The rash area feeling warm to the touch. ? Pus or a bad smell coming from the rash. Get help right away if:  The rash is on your face or nose.  You have pain in your face or pain by your eye.  You lose feeling on one side of your face.  You have trouble seeing.  You have ear pain, or you have ringing in your ear.  You have a loss of taste.  Your condition gets worse. Summary  Shingles gives you a painful skin rash and blisters that have fluid in them.  Shingles is an infection. It is caused by the same germ (virus) that causes chickenpox.  Keep your rash  covered with a loose bandage (dressing). Wear loose clothing that does not rub on your rash.  If you have blisters that did not change into scabs yet, try not to touch other people or be around people. This information is not intended to replace advice given to you by your health care provider. Make sure you discuss any questions you have with your health care provider. Document Revised: 07/10/2018 Document Reviewed: 11/20/2016 Elsevier Patient Education  2020 [employer].

## 2019-05-17 NOTE — Telephone Encounter (Signed)
See note below

## 2019-05-18 ENCOUNTER — Ambulatory Visit: Payer: Medicare Other | Admitting: Gastroenterology

## 2019-05-18 NOTE — Telephone Encounter (Signed)
So sorry to hear this. Can you help rebook her for another date for a follow up? thanks

## 2019-05-19 ENCOUNTER — Encounter: Payer: Self-pay | Admitting: Family

## 2019-05-19 ENCOUNTER — Other Ambulatory Visit: Payer: Self-pay

## 2019-05-19 ENCOUNTER — Telehealth (INDEPENDENT_AMBULATORY_CARE_PROVIDER_SITE_OTHER): Payer: Medicare Other | Admitting: Family

## 2019-05-19 DIAGNOSIS — B029 Zoster without complications: Secondary | ICD-10-CM

## 2019-05-19 DIAGNOSIS — I63442 Cerebral infarction due to embolism of left cerebellar artery: Secondary | ICD-10-CM | POA: Diagnosis not present

## 2019-05-19 NOTE — Patient Instructions (Addendum)
-   complete Valacyclovir 1 Gm twice daily x 4 days   - continue on Tylenol as needed for pain   - continue on Gabapentin 300 mg tablet three times daily    - Notify provider if vesicle rash worsen or fail to resolve after completion of Valacyclovir.

## 2019-05-19 NOTE — Progress Notes (Signed)
Patient ID: Penny Miller, female   DOB: [birth year], 86 y.o.   MRN: [id number] This service is provided via telemedicine  No vital signs collected/recorded due to the encounter was a telemedicine visit.   Location of patient (ex: home, work):  HOME  Patient consents to a telephone visit:  YES  Location of the provider (ex: office, home):  OFFICE  Name of any referring provider:  [name] REED, DO  Names of all persons participating in the telemedicine service and their role in the encounter:  PATIENT, Penny Miller, [place], Penny Sax, NP  Time spent on call:  5:22  Provider: Kejuan Bekker FNP-C  Penny Curry, DO  Patient Care Team: Penny Curry, DO as PCP - General (Geriatric Medicine) Minus Breeding, MD as PCP - Cardiology (Cardiology) Ulice Bold, MD as Referring Physician (Dermatology) Love, Alyson Locket, MD as Consulting Physician (Neurology) Sharyne Peach, MD as Consulting Physician (Ophthalmology) Gean Birchwood, DPM as Consulting Physician (Podiatry) Ronald Lobo, MD as Consulting Physician (Gastroenterology) Ubaldo Glassing, Marny Lowenstein, MD (Inactive) as Consulting Physician (Gynecology) Druscilla Brownie, MD as Consulting Physician (Dermatology) Clent Jacks, MD as Consulting Physician (Ophthalmology) Myrlene Broker, MD as Attending Physician (Urology) Minus Breeding, MD as Consulting Physician (Cardiology)  Extended Emergency Contact Information Primary Emergency Contact: Penny,Miller Address: [place], [place] Phone: [phone] Relation: Son Secondary Emergency Contact: Penny,Penny  [place] Phone: [phone] Relation: Granddaughter  Code Status:  Full Code  Goals of care: Advanced Directive information Advanced Directives 05/17/2019  Does Patient Have a Medical Advance Directive? No  Type of Advance Directive -  Does patient want to make changes to medical advance  directive? -  Copy of [place] in Chart? -  Would patient like information on creating a medical advance directive? -     Chief Complaint  Patient presents with  . Acute Visit    follow up on Shingles    HPI:  Pt is a 86 y.o. female seen today for an acute visit for follow up shingles rash on left arm and upper back.she is seen with her Penny Miller daughter Penny Miller assisting with video.she was seen 05/17/2019 for shingles Valacyclovir 1Gm twice daily was initiated.she is on her 3rd day of treatment today.Penny states blisters seems to be going down and redness has improved.Patient states still has some burning sensation.No drainage,fever or chills.      Past Medical History:  Diagnosis Date  . Allergic rhinitis, cause unspecified   . Anxiety state, unspecified   . Arthritis   . Colon polyp   . Diaphragmatic hernia without mention of obstruction or gangrene   . Diverticulosis of colon (without mention of hemorrhage)   . Female stress incontinence   . HTN (hypertension)   . Insomnia, unspecified   . Interstitial cystitis    surgery 02/01/2013  . Irritable bowel syndrome   . Memory loss   . Other and unspecified hyperlipidemia   . Other premature beats   . Polyneuropathy in diabetes(357.2)   . Postmenopausal bleeding   . Proteinuria   . Rectal ulcer   . Skin cancer   . Stroke ([place]) 2018  . 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 Unspecified essential hypertension   . Viral warts, unspecified    Past Surgical History:  Procedure Laterality Date  . ABDOMINAL HYSTERECTOMY  1977   Dr.Hambright  . APPENDECTOMY  1965  . CATARACT EXTRACTION, BILATERAL  02/10/2006   Dr.Groat   . COLONOSCOPY  07/17/2004   polypectomy  . [place]  . LOOP RECORDER INSERTION N/A 07/31/2016   Procedure: Loop Recorder Insertion;  Surgeon: [name], MD;  Location: [facility];  Service: Cardiovascular;  Laterality: N/A;  . TEE WITHOUT CARDIOVERSION N/A 07/30/2016   Procedure: TRANSESOPHAGEAL ECHOCARDIOGRAM (TEE);  Surgeon: [name], MD;  Location: [facility];  Service: Cardiovascular;  Laterality: N/A;  . [place]  . TRIGGER FINGER RELEASE  05/13/16  . urinary bladder      surgery 02/01/2013 to stretch bladder stem    Allergies  Allergen Reactions  . Jardiance [Empagliflozin] Diarrhea  . Metformin And Related Diarrhea  . Ceclor [Cefaclor] Other (See Comments)    Unknown reaction; not recalled  . Codeine Nausea And Vomiting  . Lopid [Gemfibrozil] Other (See Comments)    Unknown reaction; not recalled  . Metoprolol Nausea And Vomiting  . Morphine And Related Other (See Comments)    Passed out  . Niacin Nausea And Vomiting    Unknown reaction Unknown reaction; not recalled  . Niacin And Related Other (See Comments)    Unknown reaction; not recalled  . Other   . Relafen [Nabumetone] Nausea And Vomiting  . Septra [Sulfamethoxazole-Trimethoprim] Nausea And Vomiting  . Tetracycline Nausea And Vomiting  . Toprol Xl [Metoprolol Tartrate] Nausea And Vomiting  . Adhesive [Tape] Rash and Other (See Comments)    Tears the skin also    Outpatient Encounter Medications as of 05/19/2019  Medication Sig  . ACCU-CHEK AVIVA PLUS test strip USE TO TEST BLOOD SUGAR TWICE DAILY.  Marland Kitchen albuterol (VENTOLIN HFA) 108 (90 Base) MCG/ACT inhaler Inhale 2 puffs into the lungs every 6 (six) hours as needed for wheezing or shortness of breath.  Marland Kitchen amLODipine (NORVASC) 2.5 MG tablet Take 1 tablet (2.5 mg total) by mouth daily.  Marland Kitchen atenolol (TENORMIN) 25 MG tablet Take 1 tablet by mouth 2 (two) times daily.  Marland Kitchen atorvastatin (LIPITOR) 40 MG tablet TAKE 1 TABLET DAILY AT 6 P.M.  . citalopram (CELEXA) 10 MG tablet TAKE 1 TABLET DAILY  . ELIQUIS 5 MG TABS tablet TAKE 1 TABLET TWICE A DAY  . furosemide (LASIX) 20 MG tablet Take 20 mg by mouth  daily.  Marland Kitchen gabapentin (NEURONTIN) 300 MG capsule Take 1 capsule (300 mg total) by mouth 3 (three) times daily.  . Lancets (ACCU-CHEK MULTICLIX) lancets Use to test blood sugar twice daily. E11.29  . pioglitazone (ACTOS) 15 MG tablet Take 15 mg by mouth daily.  . valACYclovir (VALTREX) 1000 MG tablet Take 1 tablet (1,000 mg total) by mouth 2 (two) times daily for 7 days.  . Vitamin D, Ergocalciferol, (DRISDOL) 1.25 MG (50000 UT) CAPS capsule TAKE 1 CAPSULE ONCE WEEKLY FOR VITAMIN D  . [DISCONTINUED] ferrous sulfate 325 (65 FE) MG EC tablet Take 1 tablet (325 mg total) by mouth 2 (two) times daily.   No facility-administered encounter medications on file as of 05/19/2019.    Review of Systems  Constitutional: Negative for appetite change, chills, fatigue and fever.  HENT: Negative for congestion, rhinorrhea, sinus pressure, sinus pain, sneezing and sore throat.   Eyes: Negative for pain, discharge, redness and itching.  Respiratory: Negative for cough, chest tightness, shortness of breath and wheezing.   Cardiovascular: Negative for chest pain, palpitations and  leg swelling.  Gastrointestinal: Negative for abdominal distention, abdominal pain, diarrhea, nausea and vomiting.  Genitourinary: Negative for decreased urine volume, difficulty urinating, dysuria, flank pain, frequency and urgency.  Skin: Negative for color change and pallor.       Left arm /upper back shingles per HPI   Neurological: Negative for dizziness, speech difficulty, weakness, light-headedness, numbness and headaches.       Burning sensation to shingles affected areas on left arm /upper back.   Psychiatric/Behavioral: Negative for agitation, confusion and sleep disturbance. The patient is not nervous/anxious.     Immunization History  Administered Date(s) Administered  . Influenza, High Dose Seasonal PF 12/31/2016, 12/15/2017, 01/25/2019  . Influenza,inj,Quad PF,6+ Mos 12/15/2012, 12/21/2013, 02/28/2015, 02/02/2016  .  Influenza-Unspecified 01/22/2012  . PFIZER SARS-COV-2 Vaccination 05/14/2019  . Pneumococcal Conjugate-13 05/07/2016  . Pneumococcal-Unspecified 01/24/1995  . Td 09/14/2008   Pertinent  Health Maintenance Due  Topic Date Due  . PNA vac Low Risk Adult (2 of 2 - PPSV23) 05/07/2017  . OPHTHALMOLOGY EXAM  09/20/2018  . FOOT EXAM  12/16/2018  . URINE MICROALBUMIN  12/17/2018  . HEMOGLOBIN A1C  05/05/2019  . INFLUENZA VACCINE  Completed  . DEXA SCAN  Completed   Fall Risk  05/19/2019 07/24/2018 03/05/2018 02/13/2018 12/15/2017  Falls in the past year? 0 0 0 0 No  Number falls in past yr: 0 0 0 - -  Comment - - - - -  Injury with Fall? 0 0 0 - -  Comment - - - - -  Risk for fall due to : - - - - -  Risk for fall due to: Comment - - - - -  Follow up - - - - -   Functional Status Survey:    There were no vitals filed for this visit. There is no height or weight on file to calculate BMI. Physical Exam Constitutional:      General: She is not in acute distress.    Appearance: She is not ill-appearing.  HENT:     Nose: No congestion or rhinorrhea.  Eyes:     General: No scleral icterus.       Right eye: No discharge.        Left eye: No discharge.     Conjunctiva/sclera: Conjunctivae normal.  Pulmonary:     Effort: Pulmonary effort is normal. No respiratory distress.  Skin:    General: Skin is dry.     Coloration: Skin is not pale.     Findings: Rash present. No bruising. Rash is vesicular.     Comments: Left forearm and left upper back vesicles on pink color base following C6-C7 dermatome has improved compared to 05/17/2019 visit. No drainage noted.   Neurological:     Mental Status: She is alert and oriented to person, place, and time.     Motor: No weakness.  Psychiatric:        Mood and Affect: Mood normal.        Behavior: Behavior normal.        Thought Content: Thought content normal.        Judgment: Judgment normal.    Labs reviewed: Recent Labs    11/02/18 1408  11/11/18 0846  NA 138 136  K 5.2 4.9  CL 105 99  CO2 27 21  GLUCOSE 122 194*  BUN 26* 25  CREATININE 1.40* 1.61*  CALCIUM 9.8 9.8   Recent Labs    11/02/18 1408  AST 14  ALT 15  BILITOT 0.4  PROT 6.5   Recent Labs    12/16/18 1706 01/29/19 1535 04/12/19 1115  WBC 7.7 9.4 7.9  NEUTROABS 5.8 6.4 5.4  HGB 9.8* 12.7 13.6  HCT 30.7* 38.3 40.8  MCV 83.9 89.1 94.4  PLT 189.0 166.0 164.0   Lab Results  Component Value Date   TSH 4.432 07/29/2016   Lab Results  Component Value Date   HGBA1C 7.2 (H) 11/02/2018   Lab Results  Component Value Date   CHOL 135 03/17/2017   HDL 40 (L) 03/17/2017   LDLCALC 69 03/17/2017   TRIG 179 (H) 03/17/2017   CHOLHDL 3.4 03/17/2017    Significant Diagnostic Results in last 30 days:  CUP PACEART INCLINIC DEVICE CHECK  Result Date: 04/20/2019 Loop check in clinic. Battery status: good. R-waves 0.36 mV. 0 symptom episodes, 0 tachy episodes, 5 pause episodes (nocturnal),  brady episodes. 29 AF episodes (0.7% burden). Patient on Eliquis Monthly summary reports and ROV with GT in 1 year.Lavenia Atlas, BSN, RN  CUP PACEART REMOTE DEVICE CHECK  Result Date: 04/26/2019 Carelink summary report received. Battery status OK. Normal device function. No new symptom episodes, tachy episodes, brady, or pause episodes. No new AF episodes. Monthly summary reports and ROV/PRN   Assessment/Plan   Herpes zoster infection of thoracic region No fever reports.Still has some vesicles on left arm and left upper back following C6 -C7 dermatome.skin redness has improved.on Day 3 out 7 of Valacyclovir 1 Gm twice daily initiated 05/17/2019.Reports burning sensation.Itching and pain has improved. - continue on Tylenol as needed for pain  - Advised to complete Valacyclovir 1 Gm twice daily course. - Notify provider if vesicle rash worsen or fail to resolve after completion of Valacyclovir.  - continue on Gabapentin 300 mg tablet three times daily    Family/  staff Communication: Reviewed plan of care with patient and [place] daughter.  Labs/tests ordered: None   Next Appointment: Has up coming appointment 07/27/2019 for AWVs.   Spent 15 minutes of face to face video with patient   Sandrea Hughs, NP

## 2019-05-27 ENCOUNTER — Ambulatory Visit (INDEPENDENT_AMBULATORY_CARE_PROVIDER_SITE_OTHER): Payer: Medicare Other | Admitting: *Deleted

## 2019-05-27 ENCOUNTER — Telehealth: Payer: Self-pay | Admitting: *Deleted

## 2019-05-27 DIAGNOSIS — I63442 Cerebral infarction due to embolism of left cerebellar artery: Secondary | ICD-10-CM | POA: Diagnosis not present

## 2019-05-27 LAB — CUP PACEART REMOTE DEVICE CHECK
Date Time Interrogation Session: 20210224235629
Implantable Pulse Generator Implant Date: 20180502

## 2019-05-27 NOTE — Telephone Encounter (Signed)
LINQ summary report showed 5 pause episodes on 05/04/19 between 08:10-12:18, all 3-4sec duration. 1 appears to occur post-termination of AF, others occurred during AF.   LMOM requesting call back to DC. Direct number provided. Will discuss any symptoms with episodes.

## 2019-05-27 NOTE — Telephone Encounter (Signed)
Spoke with patient. She denies dizziness or syncope associated with pause episodes on 05/04/19. Reports she was diagnosed with shingles later that week. Reports she has been doing well from a cardiac perspective. Confirmed that pt is not taking atenolol--patient confirmed that she stopped it as instructed in 12/2018. Med list updated. Advised that we will call back if [name] has any new recommendations after reviewing report. Pt verbalizes understanding and denies questions at this time.  Information relayed to [name] via result note for 05/26/19 transmission.

## 2019-05-27 NOTE — Telephone Encounter (Signed)
Pt returning [place] phone call. I told her I will have Raquel Sarna give her a call back.

## 2019-05-27 NOTE — Progress Notes (Signed)
ILR Remote 

## 2019-05-28 ENCOUNTER — Encounter: Payer: Self-pay | Admitting: Family

## 2019-05-28 ENCOUNTER — Other Ambulatory Visit: Payer: Self-pay

## 2019-05-28 ENCOUNTER — Emergency Department (HOSPITAL_COMMUNITY)
Admission: EM | Admit: 2019-05-28 | Discharge: 2019-05-28 | Disposition: A | Discharge status: Home / Self Care | Payer: Medicare Other | Attending: Emergency Medicine | Admitting: Emergency Medicine

## 2019-05-28 ENCOUNTER — Ambulatory Visit (INDEPENDENT_AMBULATORY_CARE_PROVIDER_SITE_OTHER): Payer: Medicare Other | Admitting: Family

## 2019-05-28 ENCOUNTER — Encounter (HOSPITAL_COMMUNITY): Payer: Self-pay

## 2019-05-28 DIAGNOSIS — M542 Cervicalgia: Secondary | ICD-10-CM | POA: Diagnosis not present

## 2019-05-28 DIAGNOSIS — Z79899 Other long term (current) drug therapy: Secondary | ICD-10-CM | POA: Diagnosis not present

## 2019-05-28 DIAGNOSIS — G4489 Other headache syndrome: Secondary | ICD-10-CM | POA: Diagnosis not present

## 2019-05-28 DIAGNOSIS — I10 Essential (primary) hypertension: Secondary | ICD-10-CM | POA: Diagnosis not present

## 2019-05-28 DIAGNOSIS — R52 Pain, unspecified: Secondary | ICD-10-CM | POA: Diagnosis not present

## 2019-05-28 DIAGNOSIS — H9202 Otalgia, left ear: Secondary | ICD-10-CM | POA: Diagnosis not present

## 2019-05-28 DIAGNOSIS — I129 Hypertensive chronic kidney disease with stage 1 through stage 4 chronic kidney disease, or unspecified chronic kidney disease: Secondary | ICD-10-CM | POA: Diagnosis not present

## 2019-05-28 DIAGNOSIS — E1122 Type 2 diabetes mellitus with diabetic chronic kidney disease: Secondary | ICD-10-CM | POA: Diagnosis not present

## 2019-05-28 DIAGNOSIS — B029 Zoster without complications: Secondary | ICD-10-CM | POA: Diagnosis not present

## 2019-05-28 DIAGNOSIS — I63442 Cerebral infarction due to embolism of left cerebellar artery: Secondary | ICD-10-CM | POA: Diagnosis not present

## 2019-05-28 DIAGNOSIS — Z720 Tobacco use: Secondary | ICD-10-CM | POA: Insufficient documentation

## 2019-05-28 DIAGNOSIS — N183 Chronic kidney disease, stage 3 unspecified: Secondary | ICD-10-CM | POA: Diagnosis not present

## 2019-05-28 DIAGNOSIS — B0223 Postherpetic polyneuropathy: Secondary | ICD-10-CM | POA: Diagnosis not present

## 2019-05-28 MED ORDER — ACETAMINOPHEN 325 MG PO TABS
650.0000 mg | ORAL_TABLET | Freq: Once | ORAL | Status: AC
  Administered 2019-05-28: 650 mg via ORAL
  Filled 2019-05-28: qty 2

## 2019-05-28 MED ORDER — GABAPENTIN 300 MG PO CAPS
300.0000 mg | ORAL_CAPSULE | Freq: Once | ORAL | Status: AC
  Administered 2019-05-28: 17:00:00 300 mg via ORAL
  Filled 2019-05-28: qty 1

## 2019-05-28 MED ORDER — VALACYCLOVIR HCL 500 MG PO TABS
1000.0000 mg | ORAL_TABLET | Freq: Once | ORAL | Status: AC
  Administered 2019-05-28: 1000 mg via ORAL
  Filled 2019-05-28: qty 2

## 2019-05-28 MED ORDER — PREDNISONE 10 MG PO TABS: 60.0000 mg | ORAL_TABLET | Freq: Every day | ORAL | 0 refills | Status: AC

## 2019-05-28 MED ORDER — PREDNISONE 20 MG PO TABS
60.0000 mg | ORAL_TABLET | Freq: Once | ORAL | Status: AC
  Administered 2019-05-28: 60 mg via ORAL
  Filled 2019-05-28: qty 3

## 2019-05-28 NOTE — ED Triage Notes (Signed)
Pt bib [facility] w/ c/o headache and earpain. Pt diagnoses w/ shingles 2 weeks ago, L neck and shoulder. Pt rates pain 6/10 at this time. EMS VSS.

## 2019-05-28 NOTE — Patient Instructions (Addendum)
-   Please Go to ED for evaluation of new onset of pain in the ear,neck and left side of the head worrisome for spread of shingles to ear. - Call [facility] office to make your next regular visit and labs appointment.

## 2019-05-28 NOTE — ED Provider Notes (Signed)
[facility] EMERGENCY DEPARTMENT Provider Note   CSN: [id number] Arrival date & time: 05/28/19  1548     History Chief Complaint  Patient presents with  . Herpes Zoster    Penny Miller is a 86 y.o. female presenting with herpes zoster rash on left arm and left neck.  She reports onset of rash 2 weeks ago.  Denies there are any more "blisters" and says just a painful scabbing.  The rash is on her left upper arm and extending to the left side of her back.  She reports pain in her upper left neck posteriorly as well.  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  She denies any painful vision, blurred vision, or eye redness.  Denies any new lesions on her face or nose.  Denies confusion, fevers.  Denies rash anywhere else.  Has had zoster rash 25 years ago.  Not immunocompromised. Not taking steroids  Started on gapabentin 300 mg TID for her pain, feels like it helped a "bit" but the pain is getting worse.  Her office NP note from today notes she has had a rash in the C5-C6 distribution and completed a full course of valtrex already for this.  HPI     Past Medical History:  Diagnosis Date  . Allergic rhinitis, cause unspecified   . Anxiety state, unspecified   . Arthritis   . Colon polyp   . Diaphragmatic hernia without mention of obstruction or gangrene   . Diverticulosis of colon (without mention of hemorrhage)   . Female stress incontinence   . HTN (hypertension)   . Insomnia, unspecified   . Interstitial cystitis    surgery 02/01/2013  . Irritable bowel syndrome   . Memory loss   . Other and unspecified hyperlipidemia   . Other premature beats   . Polyneuropathy in diabetes(357.2)   . Postmenopausal bleeding   . Proteinuria   . Rectal ulcer   . Skin  cancer   . Stroke ([place]) 2018  . 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 Unspecified essential hypertension   . Viral warts, unspecified     Patient Active Problem List   Diagnosis Date Noted  . Lumbar radiculopathy 05/26/2018  . Acute respiratory failure with hypoxia ([place]) 01/31/2018  . Spinal stenosis of lumbar region with neurogenic claudication 07/21/2017  . Depression, major, in remission ([place]) 07/21/2017  . Vertigo of central origin 07/21/2017  . Benign hypertension 07/21/2017  . Overweight (BMI 25.0-29.9) 11/07/2016  . Paroxysmal atrial fibrillation ([place]) 09/03/2016  . Diabetic polyneuropathy associated with type 2 diabetes mellitus ([place]) 09/03/2016  . Aneurysm of vertebral artery ([place]) 08/12/2016  . Embolic stroke involving left cerebellar artery ([place]) 07/29/2016  . Chronic kidney disease (CKD), active medical management without dialysis, stage 3 (moderate) ([place]) 05/08/2016  . Palpitations 06/27/2015  . Dyspnea 06/27/2015  . Trigger finger 06/27/2015  . History of fall 02/28/2015  . DM (diabetes mellitus), type 2 with renal complications ([place]) 63/04/6008  . Sinusitis, chronic 06/22/2014  . Varicose vein of leg 12/21/2013  . Seborrheic keratosis 12/21/2013  . Deaf 06/15/2013  . Bladder infection, chronic 01/04/2013  . Polyneuropathy in diabetes(357.2)   . Memory  loss   . Insomnia, unspecified   . Essential hypertension   . Lipoma of unspecified site   . Hyperlipidemia   . Pain in joint, ankle and foot 11/20/2012  . Herpes zoster infection of thoracic region 08/28/2012  . Chronic interstitial cystitis     Past Surgical History:  Procedure Laterality Date  . ABDOMINAL HYSTERECTOMY  1977   Dr.Hambright  . APPENDECTOMY  1965  . CATARACT EXTRACTION, BILATERAL  02/10/2006   Dr.Groat   . COLONOSCOPY  07/17/2004   polypectomy  . [place]  . LOOP RECORDER INSERTION N/A  07/31/2016   Procedure: Loop Recorder Insertion;  Surgeon: [name], MD;  Location: [facility];  Service: Cardiovascular;  Laterality: N/A;  . TEE WITHOUT CARDIOVERSION N/A 07/30/2016   Procedure: TRANSESOPHAGEAL ECHOCARDIOGRAM (TEE);  Surgeon: [name], MD;  Location: [facility];  Service: Cardiovascular;  Laterality: N/A;  . [place]  . TRIGGER FINGER RELEASE  05/13/16  . urinary bladder      surgery 02/01/2013 to stretch bladder stem     OB History   No obstetric history on file.     Family History  Problem Relation Age of Onset  . Pancreatic cancer Mother 78  . Heart attack Father   . Bladder Cancer Father   . Hypertension Sister   . Diabetes Sister   . Diabetes Brother   . Kidney cancer Brother   . Hypertension Brother   . Diabetes Brother   . Heart disease Brother 41       Myocardial Infarction     Social History   Tobacco Use  . Smoking status: Former Smoker    Packs/day: 0.25    Years: 62.00    Pack years: 15.50    Quit date: 04/02/2011    Years since quitting: 8.1  . Smokeless tobacco: Never Used  . Tobacco comment: stopped consistently smoking in 2012 but still smokes occasionally (one pack per week)  Substance Use Topics  . Alcohol use: No  . Drug use: No    Home Medications Prior to Admission medications   Medication Sig Start Date End Date Taking? Authorizing Provider  ACCU-CHEK AVIVA PLUS test strip USE TO TEST BLOOD SUGAR TWICE DAILY. 09/11/18   Reed, [name] L, DO  albuterol (VENTOLIN HFA) 108 (90 Base) MCG/ACT inhaler Inhale 2 puffs into the lungs every 6 (six) hours as needed for wheezing or shortness of breath. 10/09/18   Reed, [name] L, DO  amLODipine (NORVASC) 2.5 MG tablet Take 1 tablet (2.5 mg total) by mouth daily. 06/30/18   Reed, [name] L, DO  atorvastatin (LIPITOR) 40 MG tablet TAKE 1 TABLET DAILY AT 6 P.M. 10/05/18   Reed, [name] L, DO  citalopram (CELEXA) 10 MG tablet TAKE 1 TABLET DAILY  03/29/19   Reed, [name] L, DO  ELIQUIS 5 MG TABS tablet TAKE 1 TABLET TWICE A DAY 09/29/18   Sherran Needs, NP  furosemide (LASIX) 20 MG tablet Take 20 mg by mouth daily.    [provider]  gabapentin (NEURONTIN) 300 MG capsule Take 1 capsule (300 mg total) by mouth 3 (three) times daily. 01/25/19   Lomax, Amy, NP  Lancets (ACCU-CHEK MULTICLIX) lancets Use to test blood sugar twice daily. E11.29 07/04/17   Reed, [name] L, DO  pioglitazone (ACTOS) 15 MG tablet Take 15 mg by mouth daily. 03/29/19   [provider]  predniSONE (DELTASONE) 10 MG tablet  Take 6 tablets (60 mg total) by mouth daily for 4 days. 05/29/19 06/02/19  Wyvonnia Dusky, MD  Vitamin D, Ergocalciferol, (DRISDOL) 1.25 MG (50000 UT) CAPS capsule TAKE 1 CAPSULE ONCE WEEKLY FOR VITAMIN D 07/20/18   Reed, [name] L, DO    Allergies    Jardiance [empagliflozin], Metformin and related, Ceclor [cefaclor], Codeine, Lopid [gemfibrozil], Metoprolol, Morphine and related, Niacin, Niacin and related, Other, Relafen [nabumetone], Septra [sulfamethoxazole-trimethoprim], Tetracycline, Toprol xl [metoprolol tartrate], and Adhesive [tape]  Review of Systems   Review of Systems  Constitutional: Negative for chills and fever.  HENT: Negative for ear discharge, ear pain, facial swelling, hearing loss and voice change.   Eyes: Negative for photophobia, pain, redness and visual disturbance.  Respiratory: Negative for cough and shortness of breath.   Cardiovascular: Negative for chest pain and palpitations.  Gastrointestinal: Negative for abdominal pain and vomiting.  Musculoskeletal: Positive for arthralgias and myalgias.  Skin: Positive for rash and wound. Negative for color change.  All other systems reviewed and are negative.   Physical Exam Updated Vital Signs BP (!) 163/82 (BP Location: Right Arm)   Pulse 71   Temp 97.8 F (36.6 C) (Oral)   Resp 16   Ht 5\' 6"  (1.676 m)   Wt 79.4 kg   SpO2 96%   BMI 28.25 kg/m    Physical Exam Vitals and nursing note reviewed.  Constitutional:      General: She is not in acute distress.    Appearance: She is well-developed.  HENT:     Head: Normocephalic and atraumatic.     Left Ear: Hearing, tympanic membrane, ear canal and external ear normal. No decreased hearing noted. Tympanic membrane is not bulging.     Ears:     Comments: No vesicles noted in left pinna, external canal or TM Right ear with chronically reduced hearing (deafness), no visible lesions Eyes:     Conjunctiva/sclera: Conjunctivae normal.  Cardiovascular:     Rate and Rhythm: Normal rate and regular rhythm.     Pulses: Normal pulses.  Pulmonary:     Effort: Pulmonary effort is normal. No respiratory distress.  Abdominal:     Palpations: Abdomen is soft.     Tenderness: There is no abdominal tenderness.  Musculoskeletal:     Cervical back: Normal range of motion and neck supple. No rigidity.  Skin:    General: Skin is warm and dry.     Comments: Zoster rash in C5-C7 distribution involving the backs and proximal arm, no visible lesions around the ear, neck, or face Patient reporting myalgias along trapezius muscle insertion point on left side of neck  Neurological:     General: No focal deficit present.     Mental Status: She is alert and oriented to person, place, and time. Mental status is at baseline.     Motor: No weakness.  Psychiatric:        Mood and Affect: Mood normal.        Behavior: Behavior normal.     ED Results / Procedures / Treatments   Labs (all labs ordered are listed, but only abnormal results are displayed) Labs Reviewed - No data to display  EKG None  Radiology No results found.  Procedures Procedures (including critical care time)  Medications Ordered in ED Medications  valACYclovir (VALTREX) tablet 1,000 mg (1,000 mg Oral Given 05/28/19 1703)  predniSONE (DELTASONE) tablet 60 mg (60 mg Oral Given 05/28/19 1704)  acetaminophen (TYLENOL) tablet 650  mg (650 mg  Oral Given 05/28/19 1703)  gabapentin (NEURONTIN) capsule 300 mg (300 mg Oral Given 05/28/19 1703)    ED Course  I have reviewed the triage vital signs and the nursing notes.  Pertinent labs & imaging results that were available during my care of the patient were reviewed by me and considered in my medical decision making (see chart for details).  86 yo F here with zoster rash for 2 weeks.  Completed valtrex course as outpatient.  Also on gabapentin.  Does not appear to have been given steroids.  Sent in by NP at family clinic for concern for ear involvement (Ramsay hunt syndrome?).  No evidence of ear involvement, nor zoster ophathlmicus.  I suspect she is likely over the dissemination and infectious phase of the virus, as she has no active blistering.  But we discussed ongoing contact precautions for the next week, trying prednisone x 5 days at 60 mg, including now, and increasing her gabapentin dose for pain.   I offered her a prescription for steroid eye drops in case this does spread to involve her eye, and advised if that happens she should schedule an urgent evaluation with her eye doctor.  However I also explained I don't expect this rash to spread much further at this point in the disease course.  No signs or symptoms of herpes meningitis.  She is not immunocompromised.  I dont think she needs IV acyclovir or hospitalization at this time.  She is well appearing.      Final Clinical Impression(s) / ED Diagnoses Final diagnoses:  Herpes zoster without complication    Rx / DC Orders ED Discharge Orders         Ordered    predniSONE (DELTASONE) 10 MG tablet  Daily     05/28/19 1830           Wyvonnia Dusky, MD 05/29/19 [phone]

## 2019-05-28 NOTE — Discharge Instructions (Addendum)
Start taking prednisone as prescribed for the next 4 days.  Your received a dose today.  For your pain, you can double your gabapentin dose in the morning and evening, so that you take 600 mg in the morning, 300 mg in the afternoon, and 600 mg in the evening.  If you start to feel woozy taking this much, go BACK to your regular dosing of 300 mg three times daily.  You can take tylenol 650 mg every 6 hours for pain.  Most people recover from shingles within a month.   If you continue having pain, follow up with your doctor in 2 weeks.  I did not see signs of involvement of your ear or your eyes.  However, if you develop painful vision, blurred vision, red painful eye, call your eye doctor for an urgent appointment.  (You can explain that you are concerned you may have herpes in your eye and were told by the ER to see an eye doctor immediately if it happens.)  If you cannot schedule an appointment with your eye doctor in the next 48 hours, come into the ER.

## 2019-05-28 NOTE — Progress Notes (Signed)
Patient ID: Penny Miller, female   DOB: 16-Jun-1931, 86 y.o.   MRN: [id number] This service is provided via telemedicine  No vital signs collected/recorded due to the encounter was a telemedicine visit.   Location of patient (ex: home, work):  HOME  Patient consents to a telephone visit:  YES  Location of the provider (ex: office, home):  OFFICE  Name of any referring provider:  [name] REED, DO  Names of all persons participating in the telemedicine service and their role in the encounter:  PATIENT, [place], [name], [place], [facility] , NP  Time spent on call:  3:02    Provider:   FNP-C  [name], DO  Patient Care Team: [name], DO as PCP - General (Geriatric Medicine) Minus Breeding, MD as PCP - Cardiology (Cardiology) Ulice Bold, MD as Referring Physician (Dermatology) Love, Alyson Locket, MD as Consulting Physician (Neurology) Sharyne Peach, MD as Consulting Physician (Ophthalmology) Gean Birchwood, DPM as Consulting Physician (Podiatry) Ronald Lobo, MD as Consulting Physician (Gastroenterology) Ubaldo Glassing, Marny Lowenstein, MD (Inactive) as Consulting Physician (Gynecology) Druscilla Brownie, MD as Consulting Physician (Dermatology) Clent Jacks, MD as Consulting Physician (Ophthalmology) Myrlene Broker, MD as Attending Physician (Urology) Minus Breeding, MD as Consulting Physician (Cardiology)  Extended Emergency Contact Information Primary Emergency Contact: Dasch,[name] Address: [place], [place] Phone: [phone] Relation: Son Secondary Emergency Contact: Murphy,[name]  [place] Phone: [phone] Relation: Granddaughter  Code Status: Full Code  Goals of care: Advanced Directive information Advanced Directives 05/17/2019  Does Patient Have a Medical Advance Directive? No  Type of Advance Directive -  Does patient want to make  changes to medical advance directive? -  Copy of [place] in Chart? -  Would patient like information on creating a medical advance directive? -     Chief Complaint  Patient presents with  . Acute Visit    shingles    HPI:  Pt is a 86 y.o. female seen today for an acute video visit  for shingles.Her [place] Daughter present during visit helping with the video.she states  completed valacyclovir tablets three days ago.Blisters on the arm and upper back have dried up but states still having pain on the affected areas.Also states feels like there's a node on the left side of the head and below the ear.she states the having pain on the neck and inside the ear.she denies any fever,chills,rash on the ear or head.No changes in her hearing.she states has no appetite has to make her self eat. Also having a little burning sensation with urination.  Past Medical History:  Diagnosis Date  . Allergic rhinitis, cause unspecified   . Anxiety state, unspecified   . Arthritis   . Colon polyp   . Diaphragmatic hernia without mention of obstruction or gangrene   . Diverticulosis of colon (without mention of hemorrhage)   . Female stress incontinence   . HTN (hypertension)   . Insomnia, unspecified   . Interstitial cystitis    surgery 02/01/2013  . Irritable bowel syndrome   . Memory loss   . Other and unspecified hyperlipidemia   . Other premature beats   . Polyneuropathy in diabetes(357.2)   . Postmenopausal bleeding   . Proteinuria   . Rectal ulcer   . Skin cancer   . Stroke ([place]) 2018  . Type II or unspecified type diabetes mellitus  with neurological manifestations, not stated as uncontrolled(250.60)   . Unspecified essential hypertension   . Viral warts, unspecified    Past Surgical History:  Procedure Laterality Date  . ABDOMINAL HYSTERECTOMY  1977   Dr.Hambright  . APPENDECTOMY  1965  . CATARACT EXTRACTION, BILATERAL  02/10/2006   Dr.Groat   . COLONOSCOPY   07/17/2004   polypectomy  . [place]  . LOOP RECORDER INSERTION N/A 07/31/2016   Procedure: Loop Recorder Insertion;  Surgeon: [name], MD;  Location: [facility];  Service: Cardiovascular;  Laterality: N/A;  . TEE WITHOUT CARDIOVERSION N/A 07/30/2016   Procedure: TRANSESOPHAGEAL ECHOCARDIOGRAM (TEE);  Surgeon: [name], MD;  Location: [facility];  Service: Cardiovascular;  Laterality: N/A;  . [place]  . TRIGGER FINGER RELEASE  05/13/16  . urinary bladder      surgery 02/01/2013 to stretch bladder stem    Allergies  Allergen Reactions  . Jardiance [Empagliflozin] Diarrhea  . Metformin And Related Diarrhea  . Ceclor [Cefaclor] Other (See Comments)    Unknown reaction; not recalled  . Codeine Nausea And Vomiting  . Lopid [Gemfibrozil] Other (See Comments)    Unknown reaction; not recalled  . Metoprolol Nausea And Vomiting  . Morphine And Related Other (See Comments)    Passed out  . Niacin Nausea And Vomiting    Unknown reaction Unknown reaction; not recalled  . Niacin And Related Other (See Comments)    Unknown reaction; not recalled  . Other   . Relafen [Nabumetone] Nausea And Vomiting  . Septra [Sulfamethoxazole-Trimethoprim] Nausea And Vomiting  . Tetracycline Nausea And Vomiting  . Toprol Xl [Metoprolol Tartrate] Nausea And Vomiting  . Adhesive [Tape] Rash and Other (See Comments)    Tears the skin also    Outpatient Encounter Medications as of 05/28/2019  Medication Sig  . ACCU-CHEK AVIVA PLUS test strip USE TO TEST BLOOD SUGAR TWICE DAILY.  Marland Kitchen albuterol (VENTOLIN HFA) 108 (90 Base) MCG/ACT inhaler Inhale 2 puffs into the lungs every 6 (six) hours as needed for wheezing or shortness of breath.  Marland Kitchen amLODipine (NORVASC) 2.5 MG tablet Take 1 tablet (2.5 mg total) by mouth daily.  Marland Kitchen atorvastatin (LIPITOR) 40 MG tablet TAKE 1 TABLET DAILY AT 6 P.M.  . citalopram (CELEXA) 10 MG tablet TAKE 1 TABLET DAILY   . ELIQUIS 5 MG TABS tablet TAKE 1 TABLET TWICE A DAY  . furosemide (LASIX) 20 MG tablet Take 20 mg by mouth daily.  Marland Kitchen gabapentin (NEURONTIN) 300 MG capsule Take 1 capsule (300 mg total) by mouth 3 (three) times daily.  . Lancets (ACCU-CHEK MULTICLIX) lancets Use to test blood sugar twice daily. E11.29  . pioglitazone (ACTOS) 15 MG tablet Take 15 mg by mouth daily.  . Vitamin D, Ergocalciferol, (DRISDOL) 1.25 MG (50000 UT) CAPS capsule TAKE 1 CAPSULE ONCE WEEKLY FOR VITAMIN D   No facility-administered encounter medications on file as of 05/28/2019.    Review of Systems  Constitutional: Positive for appetite change. Negative for chills, fatigue and fever.  HENT: Positive for ear pain. Negative for congestion, ear discharge, hearing loss, postnasal drip, rhinorrhea, sinus pressure, sinus pain, sneezing, sore throat and trouble swallowing.   Eyes: Negative for pain, discharge, redness, itching and visual disturbance.  Respiratory: Negative for cough, chest tightness, shortness of breath and wheezing.   Cardiovascular: Negative for chest pain, palpitations and leg swelling.  Genitourinary: Negative for difficulty urinating, flank pain, frequency  and urgency.       Reports little burning sensation with urination   Skin: Negative for color change and pallor.       Left arm /left  Upper back Shingles blisters have improved   Neurological: Negative for dizziness, speech difficulty, light-headedness and headaches.  Psychiatric/Behavioral: Negative for agitation and sleep disturbance. The patient is not nervous/anxious.     Immunization History  Administered Date(s) Administered  . Influenza, High Dose Seasonal PF 12/31/2016, 12/15/2017, 01/25/2019  . Influenza,inj,Quad PF,6+ Mos 12/15/2012, 12/21/2013, 02/28/2015, 02/02/2016  . Influenza-Unspecified 01/22/2012  . PFIZER SARS-COV-2 Vaccination 05/14/2019  . Pneumococcal Conjugate-13 05/07/2016  . Pneumococcal-Unspecified 01/24/1995  . Td  09/14/2008   Pertinent  Health Maintenance Due  Topic Date Due  . PNA vac Low Risk Adult (2 of 2 - PPSV23) 05/07/2017  . OPHTHALMOLOGY EXAM  09/20/2018  . FOOT EXAM  12/16/2018  . URINE MICROALBUMIN  12/17/2018  . HEMOGLOBIN A1C  05/05/2019  . INFLUENZA VACCINE  Completed  . DEXA SCAN  Completed   Fall Risk  05/19/2019 07/24/2018 03/05/2018 02/13/2018 12/15/2017  Falls in the past year? 0 0 0 0 No  Number falls in past yr: 0 0 0 - -  Comment - - - - -  Injury with Fall? 0 0 0 - -  Comment - - - - -  Risk for fall due to : - - - - -  Risk for fall due to: Comment - - - - -  Follow up - - - - -   There were no vitals filed for this visit. There is no height or weight on file to calculate BMI. Physical Exam Constitutional:      General: She is not in acute distress.    Appearance: She is not ill-appearing.  HENT:     Head: Normocephalic.     Nose: No congestion or rhinorrhea.  Eyes:     General: No scleral icterus.       Right eye: No discharge.        Left eye: No discharge.     Conjunctiva/sclera: Conjunctivae normal.  Pulmonary:     Effort: Pulmonary effort is normal. No respiratory distress.  Skin:    Coloration: Skin is not pale.     Findings: No bruising or erythema.     Comments: Left arm and upper back blister areas dry no redness or drainage noted along C6-7 dermatome.No rash noted on neck/ear,face or head.  Neurological:     Mental Status: She is alert and oriented to person, place, and time.  Psychiatric:        Mood and Affect: Mood normal.        Speech: Speech normal.        Behavior: Behavior normal.        Thought Content: Thought content normal.        Judgment: Judgment normal.    Labs reviewed: Recent Labs    11/02/18 1408 11/11/18 0846  NA 138 136  K 5.2 4.9  CL 105 99  CO2 27 21  GLUCOSE 122 194*  BUN 26* 25  CREATININE 1.40* 1.61*  CALCIUM 9.8 9.8   Recent Labs    11/02/18 1408  AST 14  ALT 15  BILITOT 0.4  PROT 6.5   Recent  Labs    12/16/18 1706 01/29/19 1535 04/12/19 1115  WBC 7.7 9.4 7.9  NEUTROABS 5.8 6.4 5.4  HGB 9.8* 12.7 13.6  HCT 30.7* 38.3 40.8  MCV 83.9  89.1 94.4  PLT 189.0 166.0 164.0   Lab Results  Component Value Date   TSH 4.432 07/29/2016   Lab Results  Component Value Date   HGBA1C 7.2 (H) 11/02/2018   Lab Results  Component Value Date   CHOL 135 03/17/2017   HDL 40 (L) 03/17/2017   LDLCALC 69 03/17/2017   TRIG 179 (H) 03/17/2017   CHOLHDL 3.4 03/17/2017    Significant Diagnostic Results in last 30 days:  CUP PACEART REMOTE DEVICE CHECK  Result Date: 05/27/2019 Care[facility] summary report received. Battery status OK. Normal device function. No new symptom episodes, tachy episodes, or brady episodes. 22 AF episodes, known PAF, on [place]. There were five pause episodes that lasted 3-4 seconds and occurred during the day.  Sent to triage.  Monthly summary reports and ROV/PRN Kathy Breach, RN, CCDS, [employer]   Assessment/Plan 1. Left ear pain New onset concerning for spread of shingles to left ear.No hearing loss reported.recently completed Valacyclovir course for C 6-7 dermatome herpes zoster.Advised to get evaluated in the ED.Patient's [place] daughter and patient verbalized understanding.   2. Neck pain on left side New onset since last seen for shingles.Advised to follow up in ED for evaluation.   3. Herpes zoster infection of thoracic region No new vesicles noted.scab areas noted.No signs of infection.   4. Polyneuralgia due to herpes zoster Pain worst on upper back.continue on Gabapentin 300 mg capsule three times daily.Advised to follow up in ED for evaluation concerning possible spread of zoster rash to left ear.    Family/ staff Communication: Reviewed plan of care with patient and Rozanna Boer daughter [name] verbalized understanding.   Labs/tests ordered: None   Next Appointment: [place] Daughter will call back for appointment after ED visit.   Spent 18 minutes of  face to face on video with patient and [place] Daughter [name] verbalized understanding.    Sandrea Hughs, NP

## 2019-06-07 ENCOUNTER — Encounter: Payer: Self-pay | Admitting: Family

## 2019-06-07 ENCOUNTER — Other Ambulatory Visit: Payer: Self-pay

## 2019-06-07 ENCOUNTER — Ambulatory Visit (INDEPENDENT_AMBULATORY_CARE_PROVIDER_SITE_OTHER): Payer: Medicare Other | Admitting: Family

## 2019-06-07 DIAGNOSIS — B0223 Postherpetic polyneuropathy: Secondary | ICD-10-CM | POA: Diagnosis not present

## 2019-06-07 NOTE — Patient Instructions (Addendum)
- continue on Gabapentin 300 mg capsule one by mouth three times daily  - Okay to get your COVID-19 vaccine since shingles blisters are resolved and you have no fever,chills,body aches or upper respiratory symptoms.   Postherpetic Neuralgia Postherpetic neuralgia (PHN) is nerve pain that occurs after a shingles infection. Shingles is a painful rash that appears on one area of the body, usually on the trunk or face. Shingles is caused by the varicella-zoster virus. This is the same virus that causes chickenpox. In people who have had chickenpox, the virus can resurface years later and cause shingles. You may have PHN if you continue to have pain for 4 months after your shingles rash has gone away. PHN appears in the same area where you had the shingles rash. The pain usually goes away after the rash disappears. Getting a vaccination for shingles can prevent PHN. This vaccine is recommended for people older than 60. It may prevent shingles, and may also lower your risk of PHN if you do get shingles. What are the causes? This condition is caused by damage to your nerves from the varicella-zoster virus. The damage makes your nerves overly sensitive. What increases the risk? The following factors may make you more likely to develop this condition:  Being older than 84 years of age.  Having severe pain before your shingles rash starts.  Having a severe rash.  Having shingles in and around the eye area.  Having a disease that makes your body unable to fight infections (weak immune system). What are the signs or symptoms? The main symptom of this condition is pain. The pain may:  Often be very bad and may be described as stabbing, burning, or feeling like an electric shock.  Come and go or may be there all the time.  Be triggered by light touches on the skin or changes in temperature. You may have itching along with the pain. How is this diagnosed? This condition may be diagnosed based on your  symptoms and your history of shingles. Lab studies and other diagnostic tests are usually not needed. How is this treated? There is no cure for this condition. Treatment for PHN will focus on pain relief. Over-the-counter pain relievers do not usually relieve PHN pain. You may need to work with a pain specialist. Treatment may include:  Antidepressant medicines to help with pain and improve sleep.  Anti-seizure medicines to relieve nerve pain.  Strong pain relievers (opioids).  A numbing patch worn on the skin (lidocaine patch).  Botox (botulinum toxin) injections to block pain signals between nerves and muscles.  Injections of numbing medicine or anti-inflammatory medicines around irritated nerves. Follow these instructions at home:   It may take a long time to recover from PHN. Work closely with your health care provider and develop a good support system at home.  Take over-the-counter and prescription medicines only as told by your health care provider.  Do not drive or use heavy machinery while taking prescription pain medicine.  Wear loose, comfortable clothing.  Cover sensitive areas with a dressing to reduce friction from clothing rubbing on the area.  If directed, put ice on the painful area: ? Put ice in a plastic bag. ? Place a towel between your skin and the bag. ? Leave the ice on for 20 minutes, 2-3 times a day.  Talk to your health care provider if you feel depressed or desperate. Living with long-term pain can be depressing.  Keep all follow-up visits as told by  your health care provider. This is important. Contact a health care provider if:  Your medicine is not helping.  You are struggling to manage your pain at home. Summary  Postherpetic neuralgia is a very painful disorder that can occur after an episode of shingles.  The pain is often severe, burning, electric, or stabbing.  Prescription medicines can be helpful in managing persistent  pain.  Getting a vaccination for shingles can prevent PHN. This vaccine is recommended for people older than 60. This information is not intended to replace advice given to you by your health care provider. Make sure you discuss any questions you have with your health care provider. Document Revised: 02/28/2017 Document Reviewed: 06/04/2016 Elsevier Patient Education  [place].

## 2019-06-07 NOTE — Progress Notes (Signed)
This service is provided via telemedicine  No vital signs collected/recorded due to the encounter was a telemedicine visit.   Location of patient (ex: home, work): Home  Patient consents to a telephone visit:  Yes  Location of the provider (ex: office, home):  [place] Name of any referring provider:  N/A  Names of all persons participating in the telemedicine service and their role in the encounter:  Patient, Heriberto Antigua, [place], Marlowe Sax, NP.     Time spent on call: 8 minutes spent on the phone with Medical Assistant.   Provider: Laquan Ludden FNP-C  [name], DO  Patient Care Team: [name], DO as PCP - General (Geriatric Medicine) Minus Breeding, MD as PCP - Cardiology (Cardiology) Ulice Bold, MD as Referring Physician (Dermatology) Love, Alyson Locket, MD as Consulting Physician (Neurology) Sharyne Peach, MD as Consulting Physician (Ophthalmology) Gean Birchwood, DPM as Consulting Physician (Podiatry) Ronald Lobo, MD as Consulting Physician (Gastroenterology) Selinda Orion, MD (Inactive) as Consulting Physician (Gynecology) Druscilla Brownie, MD as Consulting Physician (Dermatology) Clent Jacks, MD as Consulting Physician (Ophthalmology) Myrlene Broker, MD as Attending Physician (Urology) Minus Breeding, MD as Consulting Physician (Cardiology)  Extended Emergency Contact Information Primary Emergency Contact: Davies,[name] Address: [address], [place] Phone: [phone] Mobile Phone: [phone] Relation: Son Secondary Emergency Contact: Murtis Sink [place] of [place] Phone: [phone] Relation: Granddaughter  Code Status:  Full Code  Goals of care: Advanced Directive information Advanced Directives 06/07/2019  Does Patient Have a Medical Advance Directive? No  Type of Advance Directive -  Does patient want to make changes to medical advance  directive? No - Patient declined  Copy of [place] in Chart? -  Would patient like information on creating a medical advance directive? -     Chief Complaint  Patient presents with  . Acute Visit    Singles not Improving/ Covid Vaccine Concerns    HPI:  Pt is a 86 y.o. 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  States unable to get on video call this visit Granddaughter not available to assist.    Past Medical History:  Diagnosis Date  . Allergic rhinitis, cause unspecified   . Anxiety state, unspecified   . Arthritis   . Colon polyp   . Diaphragmatic hernia without mention of obstruction or gangrene   . Diverticulosis of colon (without mention of hemorrhage)   . Female stress incontinence   . HTN (hypertension)   . Insomnia, unspecified   . Interstitial cystitis    surgery 02/01/2013  . Irritable bowel syndrome   . Memory loss   . Other and unspecified hyperlipidemia   . Other  premature beats   . Polyneuropathy in diabetes(357.2)   . Postmenopausal bleeding   . Proteinuria   . Rectal ulcer   . Skin cancer   . Stroke ([place]) 2018  . Type II or unspecified type diabetes mellitus with  neurological manifestations, not stated as uncontrolled(250.60)   . Unspecified essential hypertension   . Viral warts, unspecified    Past Surgical History:  Procedure Laterality Date  . ABDOMINAL HYSTERECTOMY  1977   Dr.Hambright  . APPENDECTOMY  1965  . CATARACT EXTRACTION, BILATERAL  02/10/2006   Dr.Groat   . COLONOSCOPY  07/17/2004   polypectomy  . [place]  . LOOP RECORDER INSERTION N/A 07/31/2016   Procedure: Loop Recorder Insertion;  Surgeon: [name], MD;  Location: [facility];  Service: Cardiovascular;  Laterality: N/A;  . TEE WITHOUT CARDIOVERSION N/A 07/30/2016   Procedure: TRANSESOPHAGEAL ECHOCARDIOGRAM (TEE);  Surgeon: [name], MD;  Location: [facility];  Service: Cardiovascular;  Laterality: N/A;  . [place]  . TRIGGER FINGER RELEASE  05/13/16  . urinary bladder      surgery 02/01/2013 to stretch bladder stem    Allergies  Allergen Reactions  . Jardiance [Empagliflozin] Diarrhea  . Metformin And Related Diarrhea  . Ceclor [Cefaclor] Other (See Comments)    Unknown reaction; not recalled  . Codeine Nausea And Vomiting  . Lopid [Gemfibrozil] Other (See Comments)    Unknown reaction; not recalled  . Metoprolol Nausea And Vomiting  . Morphine And Related Other (See Comments)    Passed out  . Niacin Nausea And Vomiting    Unknown reaction Unknown reaction; not recalled  . Niacin And Related Other (See Comments)    Unknown reaction; not recalled  . Other   . Relafen [Nabumetone] Nausea And Vomiting  . Septra [Sulfamethoxazole-Trimethoprim] Nausea And Vomiting  . Tetracycline Nausea And Vomiting  . Toprol Xl [Metoprolol Tartrate] Nausea And Vomiting  . Adhesive [Tape] Rash and Other (See Comments)    Tears the skin also    Outpatient Encounter Medications as of 06/07/2019  Medication Sig  . ACCU-CHEK AVIVA PLUS test strip USE TO TEST BLOOD SUGAR TWICE DAILY.  Marland Kitchen amLODipine (NORVASC) 2.5  MG tablet Take 1 tablet (2.5 mg total) by mouth daily.  Marland Kitchen atorvastatin (LIPITOR) 40 MG tablet TAKE 1 TABLET DAILY AT 6 P.M.  . citalopram (CELEXA) 10 MG tablet TAKE 1 TABLET DAILY  . ELIQUIS 5 MG TABS tablet TAKE 1 TABLET TWICE A DAY  . furosemide (LASIX) 20 MG tablet Take 20 mg by mouth daily.  Marland Kitchen gabapentin (NEURONTIN) 300 MG capsule Take 1 capsule (300 mg total) by mouth 3 (three) times daily.  . Lancets (ACCU-CHEK MULTICLIX) lancets Use to test blood sugar twice daily. E11.29  . pioglitazone (ACTOS) 15 MG tablet Take 15 mg by mouth daily.  . Vitamin D, Ergocalciferol, (DRISDOL) 1.25 MG (50000 UT) CAPS capsule TAKE 1 CAPSULE ONCE WEEKLY FOR VITAMIN D  . [DISCONTINUED] albuterol (VENTOLIN HFA) 108 (90 Base) MCG/ACT inhaler Inhale 2 puffs into the lungs every 6 (six) hours as needed for wheezing or shortness of breath.   No facility-administered encounter medications on file as of 06/07/2019.    Review of Systems  Constitutional: Negative for appetite change, chills, fatigue and fever.  HENT: Negative for congestion, postnasal drip, rhinorrhea, sinus pressure, sinus pain, sneezing and sore throat.   Respiratory: Negative for cough, chest tightness, shortness of breath and wheezing.  Cardiovascular: Negative for chest pain, palpitations and leg swelling.  Gastrointestinal: Negative for abdominal distention, abdominal pain, constipation, diarrhea, nausea and vomiting.  Skin: Negative for color change, pallor and rash.  Neurological: Negative for dizziness, speech difficulty, weakness, light-headedness and headaches.  Psychiatric/Behavioral: Negative for agitation and confusion. The patient is not nervous/anxious.     Immunization History  Administered Date(s) Administered  . Influenza, High Dose Seasonal PF 12/31/2016, 12/15/2017, 01/25/2019  . Influenza,inj,Quad PF,6+ Mos 12/15/2012, 12/21/2013, 02/28/2015, 02/02/2016  . Influenza-Unspecified 01/22/2012  . PFIZER SARS-COV-2 Vaccination  05/14/2019  . Pneumococcal Conjugate-13 05/07/2016  . Pneumococcal-Unspecified 01/24/1995  . Td 09/14/2008   Pertinent  Health Maintenance Due  Topic Date Due  . PNA vac Low Risk Adult (2 of 2 - PPSV23) 05/07/2017  . OPHTHALMOLOGY EXAM  09/20/2018  . FOOT EXAM  12/16/2018  . URINE MICROALBUMIN  12/17/2018  . HEMOGLOBIN A1C  05/05/2019  . INFLUENZA VACCINE  Completed  . DEXA SCAN  Completed   Fall Risk  06/07/2019 05/19/2019 07/24/2018 03/05/2018 02/13/2018  Falls in the past year? 0 0 0 0 0  Number falls in past yr: 0 0 0 0 -  Comment - - - - -  Injury with Fall? 0 0 0 0 -  Comment - - - - -  Risk for fall due to : - - - - -  Risk for fall due to: Comment - - - - -  Follow up - - - - -   There were no vitals filed for this visit. There is no height or weight on file to calculate BMI. Physical Exam Unable to complete on Telephone visit.  Labs reviewed: Recent Labs    11/02/18 1408 11/11/18 0846  NA 138 136  K 5.2 4.9  CL 105 99  CO2 27 21  GLUCOSE 122 194*  BUN 26* 25  CREATININE 1.40* 1.61*  CALCIUM 9.8 9.8   Recent Labs    11/02/18 1408  AST 14  ALT 15  BILITOT 0.4  PROT 6.5   Recent Labs    12/16/18 1706 01/29/19 1535 04/12/19 1115  WBC 7.7 9.4 7.9  NEUTROABS 5.8 6.4 5.4  HGB 9.8* 12.7 13.6  HCT 30.7* 38.3 40.8  MCV 83.9 89.1 94.4  PLT 189.0 166.0 164.0   Lab Results  Component Value Date   TSH 4.432 07/29/2016   Lab Results  Component Value Date   HGBA1C 7.2 (H) 11/02/2018   Lab Results  Component Value Date   CHOL 135 03/17/2017   HDL 40 (L) 03/17/2017   LDLCALC 69 03/17/2017   TRIG 179 (H) 03/17/2017   CHOLHDL 3.4 03/17/2017    Significant Diagnostic Results in last 30 days:  CUP PACEART REMOTE DEVICE CHECK  Result Date: 05/27/2019 Carelink summary report received. Battery status OK. Normal device function. No new symptom episodes, tachy episodes, or brady episodes. 22 AF episodes, known PAF, on [place]. There were five pause episodes  that lasted 3-4 seconds and occurred during the day.  Sent to triage.  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 - Continue on Gabapentin 300 mg Capsule three times daily. - Additional educational information provided on AVS.  -  She declines any fever,chills,symptoms of upper respiratory or body achiness discussed with her that  She should be okay for her to take her second dose of COVID-19 vaccine. Patient  verbalized understanding.  Family/ staff Communication: Reviewed plan of care with patient  Labs/tests ordered: None   Next Appointment: as needed if symptoms worsen or fail to improve.  Spent 11 minutes of non-face to face with patient    I connected with  [name] on 06/07/19 by a video enabled telemedicine application and verified that I am speaking with the correct person using two identifiers.   I discussed the limitations of evaluation and management by telemedicine. The patient expressed understanding and agreed to proceed.   Sandrea Hughs, NP

## 2019-06-14 ENCOUNTER — Telehealth: Payer: Self-pay

## 2019-06-14 NOTE — Telephone Encounter (Signed)
Spoke with patient regarding disconnected monitor 

## 2019-06-15 DIAGNOSIS — H04123 Dry eye syndrome of bilateral lacrimal glands: Secondary | ICD-10-CM | POA: Diagnosis not present

## 2019-06-15 DIAGNOSIS — Z961 Presence of intraocular lens: Secondary | ICD-10-CM | POA: Diagnosis not present

## 2019-06-15 LAB — HM DIABETES EYE EXAM

## 2019-06-21 ENCOUNTER — Other Ambulatory Visit: Payer: Self-pay | Admitting: *Deleted

## 2019-06-21 ENCOUNTER — Other Ambulatory Visit: Payer: Self-pay | Admitting: Internal Medicine

## 2019-06-21 MED ORDER — AMLODIPINE BESYLATE 2.5 MG PO TABS: 2.5000 mg | ORAL_TABLET | Freq: Every day | ORAL | 3 refills | Status: DC

## 2019-06-21 MED ORDER — AMLODIPINE BESYLATE 2.5 MG PO TABS: 2.5000 mg | ORAL_TABLET | Freq: Every day | ORAL | 0 refills | Status: DC

## 2019-06-21 NOTE — Telephone Encounter (Signed)
Patient requested refill.  Stated that she has ran out of medication and needs a local supply and a mail order supply called to pharmacy.

## 2019-06-27 LAB — CUP PACEART REMOTE DEVICE CHECK
Date Time Interrogation Session: 20210328022604
Implantable Pulse Generator Implant Date: 20180502

## 2019-06-28 ENCOUNTER — Ambulatory Visit (INDEPENDENT_AMBULATORY_CARE_PROVIDER_SITE_OTHER): Payer: Medicare Other | Admitting: *Deleted

## 2019-06-28 DIAGNOSIS — I63442 Cerebral infarction due to embolism of left cerebellar artery: Secondary | ICD-10-CM | POA: Diagnosis not present

## 2019-06-28 NOTE — Progress Notes (Signed)
ILR Remote 

## 2019-07-09 ENCOUNTER — Ambulatory Visit: Payer: Medicare Other | Admitting: Podiatry

## 2019-07-19 ENCOUNTER — Other Ambulatory Visit: Payer: Self-pay

## 2019-07-19 DIAGNOSIS — I5033 Acute on chronic diastolic (congestive) heart failure: Secondary | ICD-10-CM

## 2019-07-19 MED ORDER — FUROSEMIDE 20 MG PO TABS: 20.0000 mg | ORAL_TABLET | Freq: Every day | ORAL | 0 refills | Status: DC

## 2019-07-19 NOTE — Telephone Encounter (Signed)
Called patient to make appointment but no answer. Voicemail was left with office call back number.

## 2019-07-19 NOTE — Telephone Encounter (Signed)
[facility]  She needs an appt with me though b/c I haven't seen her in ages and she only has an AWV on the schedule.

## 2019-07-19 NOTE — Telephone Encounter (Signed)
Patient pharmacy faxed medication refill on 'Furosemide". Patient has this medication on medication list but I don't see that medication has been filled by you. Please Advise.

## 2019-07-22 ENCOUNTER — Encounter: Payer: Self-pay | Admitting: *Deleted

## 2019-07-23 ENCOUNTER — Other Ambulatory Visit: Payer: Self-pay

## 2019-07-23 DIAGNOSIS — I5033 Acute on chronic diastolic (congestive) heart failure: Secondary | ICD-10-CM

## 2019-07-23 NOTE — Telephone Encounter (Signed)
Patient medication refill and sent to pharmacy.

## 2019-07-23 NOTE — Telephone Encounter (Signed)
I believe you already sent a [facility].

## 2019-07-23 NOTE — Telephone Encounter (Signed)
Called patient and she notified of medication refill. Patient agreed to have appointment with you. Appointment scheduled for May 13th, at 10am.

## 2019-07-27 ENCOUNTER — Other Ambulatory Visit: Payer: Self-pay | Admitting: Family

## 2019-07-27 ENCOUNTER — Encounter: Payer: Self-pay | Admitting: Family

## 2019-07-27 ENCOUNTER — Other Ambulatory Visit: Payer: Self-pay

## 2019-07-27 ENCOUNTER — Ambulatory Visit (INDEPENDENT_AMBULATORY_CARE_PROVIDER_SITE_OTHER): Payer: Medicare Other | Admitting: Family

## 2019-07-27 DIAGNOSIS — Z0000 Encounter for general adult medical examination without abnormal findings: Secondary | ICD-10-CM | POA: Diagnosis not present

## 2019-07-27 DIAGNOSIS — E1129 Type 2 diabetes mellitus with other diabetic kidney complication: Secondary | ICD-10-CM | POA: Diagnosis not present

## 2019-07-27 DIAGNOSIS — I10 Essential (primary) hypertension: Secondary | ICD-10-CM

## 2019-07-27 DIAGNOSIS — Z23 Encounter for immunization: Secondary | ICD-10-CM

## 2019-07-27 DIAGNOSIS — I480 Paroxysmal atrial fibrillation: Secondary | ICD-10-CM

## 2019-07-27 MED ORDER — TETANUS-DIPHTH-ACELL PERTUSSIS 5-2.5-18.5 LF-MCG/0.5 IM SUSP: 0.5000 mL | Freq: Once | INTRAMUSCULAR | 0 refills | Status: AC

## 2019-07-27 NOTE — Progress Notes (Signed)
Subjective:   Penny Miller is a 86 y.o. female who presents for Medicare Annual (Subsequent) preventive examination.  Review of Systems:  Cardiac Risk Factors include: advanced age (>79men, >12 women);diabetes mellitus;hypertension;dyslipidemia;sedentary lifestyle;smoking/ tobacco exposure     Objective:     Vitals: There were no vitals taken for this visit.  There is no height or weight on file to calculate BMI.  Advanced Directives 07/27/2019 06/07/2019 05/28/2019 05/17/2019 11/02/2018 07/24/2018 02/13/2018  Does Patient Have a Medical Advance Directive? No No No No No No No  Type of Advance Directive - - - - - - -  Does patient want to make changes to medical advance directive? - No - Patient declined - - No - Patient declined No - Patient declined -  Copy of [place] in [place]  Would patient like information on creating a medical advance directive? - - No - Patient declined - No - Patient declined - -    Tobacco Social History   Tobacco Use  Smoking Status Former Smoker  . Packs/day: 0.25  . Years: 62.00  . Pack years: 15.50  . Quit date: 04/02/2011  . Years since quitting: 8.3  Smokeless Tobacco Never Used  Tobacco Comment   stopped consistently smoking in 2012 but still smokes occasionally (one pack per week)     Counseling given: Not Answered Comment: stopped consistently smoking in 2012 but still smokes occasionally (one pack per week)   Clinical Intake:  Pre-visit preparation completed: No  Pain : No/denies pain     BMI - recorded: 29.15 Nutritional Status: BMI 25 -29 Overweight Nutritional Risks: None Diabetes: Yes CBG done?: No Did pt. bring in CBG monitor from home?: (130's-200's)  How often do you need to have someone help you when you read instructions, pamphlets, or other written materials from your doctor or pharmacy?: 1 - Never What is the last grade level you completed in school?: 12 [place]  Interpreter Needed?:  No  Information entered by :: Cheryn Lundquist FNP-C  Past Medical History:  Diagnosis Date  . Allergic rhinitis, cause unspecified   . Anxiety state, unspecified   . Arthritis   . Colon polyp   . Diaphragmatic hernia without mention of obstruction or gangrene   . Diverticulosis of colon (without mention of hemorrhage)   . Female stress incontinence   . HTN (hypertension)   . Insomnia, unspecified   . Interstitial cystitis    surgery 02/01/2013  . Irritable bowel syndrome   . Memory loss   . Other and unspecified hyperlipidemia   . Other premature beats   . Polyneuropathy in diabetes(357.2)   . Postmenopausal bleeding   . Proteinuria   . Rectal ulcer   . Skin cancer   . Stroke ([place]) 2018  . 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 Unspecified essential hypertension   . Viral warts, unspecified    Past Surgical History:  Procedure Laterality Date  . ABDOMINAL HYSTERECTOMY  1977   Dr.Hambright  . APPENDECTOMY  1965  . CATARACT EXTRACTION, BILATERAL  02/10/2006   Dr.Groat   . COLONOSCOPY  07/17/2004   polypectomy  . [place]  . LOOP RECORDER INSERTION N/A 07/31/2016   Procedure: Loop Recorder Insertion;  Surgeon: [name], MD;  Location: [facility];  Service: Cardiovascular;  Laterality: N/A;  . TEE WITHOUT CARDIOVERSION N/A 07/30/2016  Procedure: TRANSESOPHAGEAL ECHOCARDIOGRAM (TEE);  Surgeon: [name], MD;  Location: [facility];  Service: Cardiovascular;  Laterality: N/A;  . [place]  . TRIGGER FINGER RELEASE  05/13/16  . urinary bladder      surgery 02/01/2013 to stretch bladder stem   Family History  Problem Relation Age of Onset  . Pancreatic cancer Mother 9  . Heart attack Father   . Bladder Cancer Father   . Hypertension Sister   . Diabetes Sister   . Diabetes Brother   . Kidney cancer Brother   .  Hypertension Brother   . Diabetes Brother   . Heart disease Brother 73       Myocardial Infarction    Social History   Socioeconomic History  . Marital status: Widowed    Spouse name: Not on file  . Number of children: 1  . Years of education: Not on file  . Highest education level: Not on file  Occupational History  . 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 Smoking status: Former Smoker    Packs/day: 0.25    Years: 62.00    Pack years: 15.50    Quit date: 04/02/2011    Years since quitting: 8.3  . Smokeless tobacco: Never Used  . Tobacco comment: stopped consistently smoking in 2012 but still smokes occasionally (one pack per week)  Substance and Sexual Activity  . Alcohol use: No  . Drug use: No  . Sexual activity: Never  Other Topics Concern  . Not on file  Social History Narrative   Ophthalmologist-Dr.Gould and Dr.Groat   Podiatrist- Dr.Tuchman   Dermatologist- Dr.Lupton   Urologist- Dr. Lawerance Bach    Lives with her son.    Social Determinants of Health   Financial Resource Strain:   . Difficulty of Paying Living Expenses:   Food Insecurity:   . Worried About Charity fundraiser in the Last Year:   . Arboriculturist in the Last Year:   Transportation Needs:   . Film/video editor (Medical):   Marland Kitchen Lack of Transportation (Non-Medical):   Physical Activity:   . Days of Exercise per Week:   . Minutes of Exercise per Session:   Stress:   . Feeling of Stress :   Social Connections:   . Frequency of Communication with Friends and Family:   . Frequency of Social Gatherings with Friends and Family:   . Attends Religious Services:   . Active Member of Clubs or Organizations:   . Attends Archivist Meetings:   Marland Kitchen Marital Status:     Outpatient Encounter Medications as of 07/27/2019  Medication Sig  . ACCU-CHEK AVIVA PLUS test strip USE TO TEST BLOOD SUGAR TWICE DAILY.  Marland Kitchen amLODipine (NORVASC) 2.5 MG tablet Take 1 tablet  (2.5 mg total) by mouth daily.  Marland Kitchen atorvastatin (LIPITOR) 40 MG tablet TAKE 1 TABLET DAILY AT 6 P.M.  . citalopram (CELEXA) 10 MG tablet TAKE 1 TABLET DAILY  . ELIQUIS 5 MG TABS tablet TAKE 1 TABLET TWICE A DAY  . furosemide (LASIX) 20 MG tablet Take 1 tablet (20 mg total) by mouth daily.  Marland Kitchen gabapentin (NEURONTIN) 300 MG capsule Take 1 capsule (300 mg total) by mouth 3 (three) times daily.  . Lancets (ACCU-CHEK MULTICLIX) lancets Use to test blood sugar twice daily. E11.29  . nitrofurantoin (MACRODANTIN) 100 MG capsule Take 100 mg by mouth at bedtime.  Marland Kitchen  omeprazole (PRILOSEC) 20 MG capsule Take 20 mg by mouth 2 (two) times daily.  . pioglitazone (ACTOS) 15 MG tablet Take 15 mg by mouth daily.  . Vitamin D, Ergocalciferol, (DRISDOL) 1.25 MG (50000 UNIT) CAPS capsule TAKE 1 CAPSULE ONCE WEEKLY FOR VITAMIN D   No facility-administered encounter medications on file as of 07/27/2019.    Activities of Daily Living In your present state of health, do you have any difficulty performing the following activities: 07/27/2019  Hearing? N  Vision? N  Difficulty concentrating or making decisions? Y  Comment Remembering  Walking or climbing stairs? Y  Comment uses a cane  Dressing or bathing? N  Doing errands, shopping? Y  Comment son [place] and eating ? Y  Comment son assist  Using the Toilet? N  In the past six months, have you accidently leaked urine? Y  Do you have problems with loss of bowel control? N  Managing your Medications? N  Managing your Finances? N  Housekeeping or managing your Housekeeping? Y  Comment son assist  Some recent data might be hidden    Patient Care Team: [name], DO as PCP - General (Geriatric Medicine) Minus Breeding, MD as PCP - Cardiology (Cardiology) Ulice Bold, MD as Referring Physician (Dermatology) Love, Alyson Locket, MD as Consulting Physician (Neurology) Sharyne Peach, MD as Consulting Physician (Ophthalmology) Gean Birchwood, DPM as Consulting Physician (Podiatry) Ronald Lobo, MD as Consulting Physician (Gastroenterology) Lomax, Marny Lowenstein, MD (Inactive) as Consulting Physician (Gynecology) Druscilla Brownie, MD as Consulting Physician (Dermatology) Clent Jacks, MD as Consulting Physician (Ophthalmology) Myrlene Broker, MD as Attending Physician (Urology) Minus Breeding, MD as Consulting Physician (Cardiology)    Assessment:   This is a routine wellness examination for Breiona.  Exercise Activities and Dietary recommendations Current Exercise Habits: The patient does not participate in regular exercise at present, Exercise limited by: None identified  Goals    . Weight (lb) < 165 lb (74.8 kg)      07/24/2018 would like to decrease weight to 165 lbs  Starting 05/07/2016, I will attempt to decrease my weight to reach my goal weight of 165 lbs.       Fall Risk Fall Risk  07/27/2019 06/07/2019 05/19/2019 07/24/2018 03/05/2018  Falls in the past year? 0 0 0 0 0  Number falls in past yr: 0 0 0 0 0  Comment - - - - -  Injury with Fall? 0 0 0 0 0  Comment - - - - -  Risk for fall due to : - - - - -  Risk for fall due to: Comment - - - - -  Follow up - - - - -   Is the patient's home free of loose throw rugs in walkways, pet beds, electrical cords, etc?   no      Grab bars in the bathroom? yes      Handrails on the stairs?   yes      Adequate lighting?   yes  Depression Screen PHQ 2/9 Scores 07/27/2019 05/19/2019 07/24/2018 03/05/2018  PHQ - 2 Score 0 0 0 0     Cognitive Function MMSE - Mini Mental State Exam 07/21/2017 05/07/2016 12/21/2013  Orientation to time 5 5 5   Orientation to Place 5 4 4   Registration 3 3 3   Attention/ Calculation 5 5 5   Recall 3 3 2   Language- name 2 objects 2 2 2   Language- repeat 1 1 1   Language-  follow 3 step command 3 3 3   Language- read & follow direction 1 1 1   Write a sentence 1 1 1   Copy design 1 1 1   Total score 30 29 28      6CIT Screen 07/27/2019 07/24/2018    What Year? 0 points 0 points  What month? 0 points 0 points  What time? 0 points 0 points  Count back from 20 0 points 0 points  Months in reverse 0 points 0 points  Repeat phrase 4 points 0 points  Total Score 4 0    Immunization History  Administered Date(s) Administered  . Influenza, High Dose Seasonal PF 12/31/2016, 12/15/2017, 01/25/2019  . Influenza,inj,Quad PF,6+ Mos 12/15/2012, 12/21/2013, 02/28/2015, 02/02/2016  . Influenza-Unspecified 01/22/2012  . PFIZER SARS-COV-2 Vaccination 05/14/2019  . Pneumococcal Conjugate-13 05/07/2016  . Pneumococcal-Unspecified 01/24/1995  . Td 09/14/2008    Qualifies for Shingles Vaccine? Has had shingles   Screening Tests Health Maintenance  Topic Date Due  . PNA vac Low Risk Adult (2 of 2 - PPSV23) 05/07/2017  . TETANUS/TDAP  09/15/2018  . OPHTHALMOLOGY EXAM  09/20/2018  . FOOT EXAM  12/16/2018  . URINE MICROALBUMIN  12/17/2018  . HEMOGLOBIN A1C  05/05/2019  . COVID-19 Vaccine (2 - Pfizer 2-dose series) 06/04/2019  . INFLUENZA VACCINE  10/31/2019  . DEXA SCAN  Completed    Cancer Screenings: Lung: Low Dose CT Chest recommended if Age 61-80 years, 30 pack-year currently smoking OR have quit w/in 15years. Patient does not qualify. Breast:  Up to date on Mammogram? Yes   Up to date of Bone Density/Dexa? Yes Colorectal: Aged out   Additional Screenings:  Hepatitis C Screening: low risk      Plan:   - Tdap vaccine ordered send to pharmacy.Advised to get vaccine at her pharmacy. - due for annual foot exam states has upcoming appointment which she had rescheduled when she had shingles. - Has seen Ophthalmology early this month but didn't have dates.will need records from [place].   I have personally reviewed and noted the following in the patient's chart:   . Medical and social history . Use of alcohol, tobacco or illicit drugs  . Current medications and supplements . Functional ability and status . Nutritional  status . Physical activity . Advanced directives . List of other physicians . Hospitalizations, surgeries, and ER visits in previous 12 months . Vitals . Screenings to include cognitive, depression, and falls . Referrals and appointments  In addition, I have reviewed and discussed with patient certain preventive protocols, quality metrics, and best practice recommendations. A written personalized care plan for preventive services as well as general preventive health recommendations were provided to patient.    Sandrea Hughs, NP  07/27/2019

## 2019-07-27 NOTE — Progress Notes (Signed)
    This service is provided via telemedicine  No vital signs collected/recorded due to the encounter was a telemedicine visit.   Location of patient (ex: home, work): Home.  Patient consents to a telephone visit: Yes.  Location of the provider (ex: office, home):  [facility].  Name of any referring provider: N/A  Names of all persons participating in the telemedicine service and their role in the encounter:  Patient, Clete Kuch, RMA, Ngetich, Dinah, NP.    Time spent on call: 8 minutes spent on the phone with Medical Assistant.   

## 2019-07-27 NOTE — Patient Instructions (Signed)
Penny Miller , Thank you for taking time to come for your Medicare Wellness Visit. I appreciate your ongoing commitment to your health goals. Please review the following plan we discussed and let me know if I can assist you in the future.   Screening recommendations/referrals: Colonoscopy: Aged out  Mammogram: Up to date  Bone Density : Up to date Recommended yearly ophthalmology/optometry visit for glaucoma screening and checkup Recommended yearly dental visit for hygiene and checkup  Vaccinations: Influenza vaccine : Up to date Pneumococcal vaccine: 2nd Pneumococcal 23 vaccine on next visit with Dr.Reed  Tdap vaccine: Tdap vaccine script send to pharmacy please get vaccine at your pharmacy  Shingles vaccine: N/A    Advanced directives: No   Conditions/risks identified: Advance age female > 53 yrs,Type 2 DM,Hypertension,dyslipidemia,Sedentartary ,Hx of smoking   Next appointment: 1 year    Preventive Care 37 Years and Older, Female Preventive care refers to lifestyle choices and visits with your health care provider that can promote health and wellness. What does preventive care include?  A yearly physical exam. This is also called an annual well check.  Dental exams once or twice a year.  Routine eye exams. Ask your health care provider how often you should have your eyes checked.  Personal lifestyle choices, including:  Daily care of your teeth and gums.  Regular physical activity.  Eating a healthy diet.  Avoiding tobacco and drug use.  Limiting alcohol use.  Practicing safe sex.  Taking low-dose aspirin every day.  Taking vitamin and mineral supplements as recommended by your health care provider. What happens during an annual well check? The services and screenings done by your health care provider during your annual well check will depend on your age, overall health, lifestyle risk factors, and family history of disease. Counseling  Your health care provider  may ask you questions about your:  Alcohol use.  Tobacco use.  Drug use.  Emotional well-being.  Home and relationship well-being.  Sexual activity.  Eating habits.  History of falls.  Memory and ability to understand (cognition).  Work and work Statistician.  Reproductive health. Screening  You may have the following tests or measurements:  Height, weight, and BMI.  Blood pressure.  Lipid and cholesterol levels. These may be checked every 5 years, or more frequently if you are over 31 years old.  Skin check.  Lung cancer screening. You may have this screening every year starting at age 1 if you have a 30-pack-year history of smoking and currently smoke or have quit within the past 15 years.  Fecal occult blood test (FOBT) of the stool. You may have this test every year starting at age 99.  Flexible sigmoidoscopy or colonoscopy. You may have a sigmoidoscopy every 5 years or a colonoscopy every 10 years starting at age 40.  Hepatitis C blood test.  Hepatitis B blood test.  Sexually transmitted disease (STD) testing.  Diabetes screening. This is done by checking your blood sugar (glucose) after you have not eaten for a while (fasting). You may have this done every 1-3 years.  Bone density scan. This is done to screen for osteoporosis. You may have this done starting at age 65.  Mammogram. This may be done every 1-2 years. Talk to your health care provider about how often you should have regular mammograms. Talk with your health care provider about your test results, treatment options, and if necessary, the need for more tests. Vaccines  Your health care provider may recommend certain  vaccines, such as:  Influenza vaccine. This is recommended every year.  Tetanus, diphtheria, and acellular pertussis (Tdap, Td) vaccine. You may need a Td booster every 10 years.  Zoster vaccine. You may need this after age 26.  Pneumococcal 13-valent conjugate (PCV13) vaccine.  One dose is recommended after age 46.  Pneumococcal polysaccharide (PPSV23) vaccine. One dose is recommended after age 29. Talk to your health care provider about which screenings and vaccines you need and how often you need them. This information is not intended to replace advice given to you by your health care provider. Make sure you discuss any questions you have with your health care provider. Document Released: 04/14/2015 Document Revised: 12/06/2015 Document Reviewed: 01/17/2015 Elsevier Interactive Patient Education  2017 [employer] Prevention in the Home Falls can cause injuries. They can happen to people of all ages. There are many things you can do to make your home safe and to help prevent falls. What can I do on the outside of my home?  Regularly fix the edges of walkways and driveways and fix any cracks.  Remove anything that might make you trip as you walk through a door, such as a raised step or threshold.  Trim any bushes or trees on the path to your home.  Use bright outdoor lighting.  Clear any walking paths of anything that might make someone trip, such as rocks or tools.  Regularly check to see if handrails are loose or broken. Make sure that both sides of any steps have handrails.  Any raised decks and porches should have guardrails on the edges.  Have any leaves, snow, or ice cleared regularly.  Use sand or salt on walking paths during winter.  Clean up any spills in your garage right away. This includes oil or grease spills. What can I do in the bathroom?  Use night lights.  Install grab bars by the toilet and in the tub and shower. Do not use towel bars as grab bars.  Use non-skid mats or decals in the tub or shower.  If you need to sit down in the shower, use a plastic, non-slip stool.  Keep the floor dry. Clean up any water that spills on the floor as soon as it happens.  Remove soap buildup in the tub or shower regularly.  Attach bath  mats securely with double-sided non-slip rug tape.  Do not have throw rugs and other things on the floor that can make you trip. What can I do in the bedroom?  Use night lights.  Make sure that you have a light by your bed that is easy to reach.  Do not use any sheets or blankets that are too big for your bed. They should not hang down onto the floor.  Have a firm chair that has side arms. You can use this for support while you get dressed.  Do not have throw rugs and other things on the floor that can make you trip. What can I do in the kitchen?  Clean up any spills right away.  Avoid walking on wet floors.  Keep items that you use a lot in easy-to-reach places.  If you need to reach something above you, use a strong step stool that has a grab bar.  Keep electrical cords out of the way.  Do not use floor polish or wax that makes floors slippery. If you must use wax, use non-skid floor wax.  Do not have throw rugs and other things  on the floor that can make you trip. What can I do with my stairs?  Do not leave any items on the stairs.  Make sure that there are handrails on both sides of the stairs and use them. Fix handrails that are broken or loose. Make sure that handrails are as long as the stairways.  Check any carpeting to make sure that it is firmly attached to the stairs. Fix any carpet that is loose or worn.  Avoid having throw rugs at the top or bottom of the stairs. If you do have throw rugs, attach them to the floor with carpet tape.  Make sure that you have a light switch at the top of the stairs and the bottom of the stairs. If you do not have them, ask someone to add them for you. What else can I do to help prevent falls?  Wear shoes that:  Do not have high heels.  Have rubber bottoms.  Are comfortable and fit you well.  Are closed at the toe. Do not wear sandals.  If you use a stepladder:  Make sure that it is fully opened. Do not climb a closed  stepladder.  Make sure that both sides of the stepladder are locked into place.  Ask someone to hold it for you, if possible.  Clearly mark and make sure that you can see:  Any grab bars or handrails.  First and last steps.  Where the edge of each step is.  Use tools that help you move around (mobility aids) if they are needed. These include:  Canes.  Walkers.  Scooters.  Crutches.  Turn on the lights when you go into a dark area. Replace any light bulbs as soon as they burn out.  Set up your furniture so you have a clear path. Avoid moving your furniture around.  If any of your floors are uneven, fix them.  If there are any pets around you, be aware of where they are.  Review your medicines with your doctor. Some medicines can make you feel dizzy. This can increase your chance of falling. Ask your doctor what other things that you can do to help prevent falls. This information is not intended to replace advice given to you by your health care provider. Make sure you discuss any questions you have with your health care provider. Document Released: 01/12/2009 Document Revised: 08/24/2015 Document Reviewed: 04/22/2014 Elsevier Interactive Patient Education  2017 [employer].

## 2019-07-29 LAB — CUP PACEART REMOTE DEVICE CHECK
Date Time Interrogation Session: 20210428232056
Implantable Pulse Generator Implant Date: 20180502

## 2019-08-02 ENCOUNTER — Telehealth: Payer: Self-pay | Admitting: Gastroenterology

## 2019-08-02 ENCOUNTER — Encounter: Payer: Self-pay | Admitting: Family

## 2019-08-02 ENCOUNTER — Ambulatory Visit (INDEPENDENT_AMBULATORY_CARE_PROVIDER_SITE_OTHER): Payer: Medicare Other | Admitting: *Deleted

## 2019-08-02 ENCOUNTER — Other Ambulatory Visit: Payer: Self-pay

## 2019-08-02 ENCOUNTER — Ambulatory Visit (INDEPENDENT_AMBULATORY_CARE_PROVIDER_SITE_OTHER): Payer: Medicare Other | Admitting: Family

## 2019-08-02 DIAGNOSIS — N3010 Interstitial cystitis (chronic) without hematuria: Secondary | ICD-10-CM

## 2019-08-02 DIAGNOSIS — I63442 Cerebral infarction due to embolism of left cerebellar artery: Secondary | ICD-10-CM

## 2019-08-02 DIAGNOSIS — R1011 Right upper quadrant pain: Secondary | ICD-10-CM | POA: Diagnosis not present

## 2019-08-02 DIAGNOSIS — R630 Anorexia: Secondary | ICD-10-CM | POA: Diagnosis not present

## 2019-08-02 DIAGNOSIS — R42 Dizziness and giddiness: Secondary | ICD-10-CM | POA: Diagnosis not present

## 2019-08-02 DIAGNOSIS — R531 Weakness: Secondary | ICD-10-CM | POA: Diagnosis not present

## 2019-08-02 DIAGNOSIS — R112 Nausea with vomiting, unspecified: Secondary | ICD-10-CM | POA: Diagnosis not present

## 2019-08-02 NOTE — Telephone Encounter (Signed)
Pls call pt, she states that she has been feeling sick since Friday and would like some advise.

## 2019-08-02 NOTE — Telephone Encounter (Signed)
Left message for patient to call back  

## 2019-08-02 NOTE — Progress Notes (Addendum)
This service is provided via telemedicine  No vital signs collected/recorded due to the encounter was a telemedicine visit.   Location of patient (ex: home, work): Home.  Patient consents to a telephone visit: Yes.  Location of the provider (ex: office, home):  [facility].   Name of any referring provider: N/A  Names of all persons participating in the telemedicine service and their role in the encounter:  Patient, Heriberto Antigua, [place], [place], Webb Silversmith, NP.    Time spent on call: 8 minutes spent on the phone with Medical Assistant.  Provider: Kamareon Sciandra FNP-C  [name], DO  Patient Care Team: [name], DO as PCP - General (Geriatric Medicine) Minus Breeding, MD as PCP - Cardiology (Cardiology) Ulice Bold, MD as Referring Physician (Dermatology) Love, Alyson Locket, MD as Consulting Physician (Neurology) Sharyne Peach, MD as Consulting Physician (Ophthalmology) Gean Birchwood, DPM as Consulting Physician (Podiatry) Ronald Lobo, MD as Consulting Physician (Gastroenterology) Selinda Orion, MD (Inactive) as Consulting Physician (Gynecology) Druscilla Brownie, MD as Consulting Physician (Dermatology) Clent Jacks, MD as Consulting Physician (Ophthalmology) Myrlene Broker, MD as Attending Physician (Urology) Minus Breeding, MD as Consulting Physician (Cardiology)  Extended Emergency Contact Information Primary Emergency Contact: Dildine,[name] Address: [address], [place] Phone: [phone] Mobile Phone: [phone] Relation: Son Secondary Emergency Contact: Murtis Sink [place] of [place] Phone: [phone] Relation: Granddaughter  Code Status:  Full Code  Goals of care: Advanced Directive information Advanced Directives 08/02/2019  Does Patient Have a Medical Advance Directive? No  Type of Advance Directive -  Does patient want to make changes to medical  advance directive? No - Patient declined  Copy of [place] in Chart? -  Would patient like information on creating a medical advance directive? -     Chief Complaint  Patient presents with  . Acute Visit    Complaints of Nausea and Vomiting.     HPI:  Pt is a 86 y.o. female seen today for an acute visit for evaluation of nausea and vomiting x 4 days.she states vomited on friday.since then she has been feeling dizzy,weak and tired.she stayed in the bed felt better but was so weak.She has increased her water intake.Has had no votimiting since friday.Had Nausea this morning but laid in the bed and it improved.Has no Appetite but was able to eat some chocolate ice scream which seems to stay down.she had scramble eggs too nothing else stays in her stomach.  Also complains of burning pain with voiding and frequent urination for several days.felt like she has had a fever.Has had chills.sometimes goes to the bathroom but unable to go.she follows up with Urologist.Takes Nitrofurantoin 100 mg capsule which usually helps with her chronic cystitis.she denies any lower abdominal pain but complains of right upper Quadrant pain and epigastric area which was worst this morning.Pain is described as on and off.States no pain right now since she has been lying in the bed.    Past Medical History:  Diagnosis Date  . Allergic rhinitis, cause unspecified   . Anxiety state, unspecified   . Arthritis   . Colon polyp   . Diaphragmatic hernia without mention of obstruction or gangrene   . Diverticulosis of colon (without mention of hemorrhage)   . Female stress incontinence   . HTN (hypertension)   . Insomnia, unspecified   . Interstitial cystitis  surgery 02/01/2013  . Irritable bowel syndrome   . Memory loss   . Other and unspecified hyperlipidemia   . Other premature beats   . Polyneuropathy in diabetes(357.2)   . Postmenopausal bleeding   . Proteinuria   . Rectal ulcer   . Skin  cancer   . Stroke ([place]) 2018  . 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 Unspecified essential hypertension   . Viral warts, unspecified    Past Surgical History:  Procedure Laterality Date  . ABDOMINAL HYSTERECTOMY  1977   Dr.Hambright  . APPENDECTOMY  1965  . CATARACT EXTRACTION, BILATERAL  02/10/2006   Dr.Groat   . COLONOSCOPY  07/17/2004   polypectomy  . [place]  . LOOP RECORDER INSERTION N/A 07/31/2016   Procedure: Loop Recorder Insertion;  Surgeon: [name], MD;  Location: [facility];  Service: Cardiovascular;  Laterality: N/A;  . TEE WITHOUT CARDIOVERSION N/A 07/30/2016   Procedure: TRANSESOPHAGEAL ECHOCARDIOGRAM (TEE);  Surgeon: [name], MD;  Location: [facility];  Service: Cardiovascular;  Laterality: N/A;  . [place]  . TRIGGER FINGER RELEASE  05/13/16  . urinary bladder      surgery 02/01/2013 to stretch bladder stem    Allergies  Allergen Reactions  . Jardiance [Empagliflozin] Diarrhea  . Metformin And Related Diarrhea  . Ceclor [Cefaclor] Other (See Comments)    Unknown reaction; not recalled  . Codeine Nausea And Vomiting  . Lopid [Gemfibrozil] Other (See Comments)    Unknown reaction; not recalled  . Metoprolol Nausea And Vomiting  . Morphine And Related Other (See Comments)    Passed out  . Niacin Nausea And Vomiting    Unknown reaction Unknown reaction; not recalled  . Niacin And Related Other (See Comments)    Unknown reaction; not recalled  . Relafen [Nabumetone] Nausea And Vomiting  . Septra [Sulfamethoxazole-Trimethoprim] Nausea And Vomiting  . Tetracycline Nausea And Vomiting  . Toprol Xl [Metoprolol Tartrate] Nausea And Vomiting  . Adhesive [Tape] Rash and Other (See Comments)    Tears the skin also    Outpatient Encounter Medications as of 08/02/2019  Medication Sig  . ACCU-CHEK AVIVA PLUS test  strip USE TO TEST BLOOD SUGAR TWICE DAILY.  Marland Kitchen amLODipine (NORVASC) 2.5 MG tablet Take 1 tablet (2.5 mg total) by mouth daily.  Marland Kitchen atorvastatin (LIPITOR) 40 MG tablet TAKE 1 TABLET DAILY AT 6 P.M.  . citalopram (CELEXA) 10 MG tablet TAKE 1 TABLET DAILY  . ELIQUIS 5 MG TABS tablet TAKE 1 TABLET TWICE A DAY  . gabapentin (NEURONTIN) 300 MG capsule Take 1 capsule (300 mg total) by mouth 3 (three) times daily.  . Lancets (ACCU-CHEK MULTICLIX) lancets Use to test blood sugar twice daily. E11.29  . omeprazole (PRILOSEC) 20 MG capsule Take 20 mg by mouth 2 (two) times daily.  . pioglitazone (ACTOS) 15 MG tablet Take 15 mg by mouth daily.  . Vitamin D, Ergocalciferol, (DRISDOL) 1.25 MG (50000 UNIT) CAPS capsule TAKE 1 CAPSULE ONCE WEEKLY FOR VITAMIN D  . [DISCONTINUED] furosemide (LASIX) 20 MG tablet Take 1 tablet (20 mg total) by mouth daily.  . [DISCONTINUED] nitrofurantoin (MACRODANTIN) 100 MG capsule Take 100 mg by mouth at bedtime.   No facility-administered encounter medications on file as of 08/02/2019.    Review of Systems  Constitutional: Positive for appetite change, chills and fatigue. Negative for fever.  Respiratory:  Negative for cough, chest tightness, shortness of breath and wheezing.   Cardiovascular: Negative for chest pain, palpitations and leg swelling.  Gastrointestinal: Positive for abdominal pain and nausea. Negative for abdominal distention, constipation and diarrhea.       Vomited on Friday 07/30/2019  Right upper quadrant and Epigastric pain.   Genitourinary: Negative for decreased urine volume, difficulty urinating, flank pain and hematuria.       Chronic cystitis   Neurological: Positive for dizziness. Negative for speech difficulty and headaches.       Generalized weakness  Psychiatric/Behavioral: Negative for agitation, confusion and sleep disturbance. The patient is not nervous/anxious.     Immunization History  Administered Date(s) Administered  . Influenza, High  Dose Seasonal PF 12/31/2016, 12/15/2017, 01/25/2019  . Influenza,inj,Quad PF,6+ Mos 12/15/2012, 12/21/2013, 02/28/2015, 02/02/2016  . Influenza-Unspecified 01/22/2012  . PFIZER SARS-COV-2 Vaccination 05/14/2019  . Pneumococcal Conjugate-13 05/07/2016  . Pneumococcal-Unspecified 01/24/1995  . Td 09/14/2008   Pertinent  Health Maintenance Due  Topic Date Due  . PNA vac Low Risk Adult (2 of 2 - PPSV23) 05/07/2017  . OPHTHALMOLOGY EXAM  09/20/2018  . FOOT EXAM  12/16/2018  . URINE MICROALBUMIN  12/17/2018  . HEMOGLOBIN A1C  05/05/2019  . INFLUENZA VACCINE  10/31/2019  . DEXA SCAN  Completed   Fall Risk  08/02/2019 07/27/2019 06/07/2019 05/19/2019 07/24/2018  Falls in the past year? 0 0 0 0 0  Number falls in past yr: 0 0 0 0 0  Comment - - - - -  Injury with Fall? 0 0 0 0 0  Comment - - - - -  Risk for fall due to : - - - - -  Risk for fall due to: Comment - - - - -  Follow up - - - - -   There were no vitals filed for this visit. There is no height or weight on file to calculate BMI. Physical Exam  Unable to complete on Telephone visit.   Labs reviewed: Recent Labs    11/02/18 1408 11/11/18 0846  NA 138 136  K 5.2 4.9  CL 105 99  CO2 27 21  GLUCOSE 122 194*  BUN 26* 25  CREATININE 1.40* 1.61*  CALCIUM 9.8 9.8   Recent Labs    11/02/18 1408  AST 14  ALT 15  BILITOT 0.4  PROT 6.5   Recent Labs    12/16/18 1706 01/29/19 1535 04/12/19 1115  WBC 7.7 9.4 7.9  NEUTROABS 5.8 6.4 5.4  HGB 9.8* 12.7 13.6  HCT 30.7* 38.3 40.8  MCV 83.9 89.1 94.4  PLT 189.0 166.0 164.0   Lab Results  Component Value Date   TSH 4.432 07/29/2016   Lab Results  Component Value Date   HGBA1C 7.2 (H) 11/02/2018   Lab Results  Component Value Date   CHOL 135 03/17/2017   HDL 40 (L) 03/17/2017   LDLCALC 69 03/17/2017   TRIG 179 (H) 03/17/2017   CHOLHDL 3.4 03/17/2017    Significant Diagnostic Results in last 30 days:  CUP PACEART REMOTE DEVICE CHECK  Result Date:  07/29/2019 Care[facility] summary report received. Battery status OK. Normal device function. No new symptom episodes, tachy episodes, brady, or pause episodes. No new AF episodes. Monthly summary reports and ROV/PRN   Assessment/Plan 1. Generalized weakness Reports worsening weakness after nausea and vomiting on Friday 07/30/2019 suspect possible dehydration. Advised to go to ED for evaluation verbalized understanding would like son to take her to ED.Provider tried to call patient's  son's phone number given by patient [phone] but no answer.patient called back and notified son didn't pick up for states son must be on the way home will tell him to take to ED.I've advised her to call 9-1-1 for EMS if symptoms worsen.   2. Nausea and vomiting, intractability of vomiting not specified, unspecified vomiting type Unclear etiology possible UTI.  3. Right upper quadrant abdominal pain Had epigastric and right upper quadrant pain described as intermittent. Advised to go to ED as above for further evaluation.  4. Dizziness Suspect possible from not eating and dehydration.Advised to get evaluation in ED as above.  5. Chronic interstitial cystitis Reports on going urgency,frequency and dysuria which usually responds to Nitrofurantoin managed by Urologist.would recommend getting urine specimen for U/A and C/S to rule out UTI but due to her generalized weakness ,nausea,vomiting and right upper quadrant pain recommend evaluation in the ED.  6. Poor appetite    encouraged to increase fluid intake to stay hydrate.Advised to go to ED as above.   Family/ staff Communication: Reviewed plan of care with patient verbalized understanding.   Labs/tests ordered: Advised to go to ED.   Next Appointment: 08/12/2019 with Dr.Reed   Spent 17 minutes of non-face to face with patient   I connected with  [name] on 08/03/19 by a video enabled telemedicine application and verified that I am speaking with the  correct person using two identifiers.   I discussed the limitations of evaluation and management by telemedicine. The patient expressed understanding and agreed to proceed.  Sandrea Hughs, NP

## 2019-08-02 NOTE — Progress Notes (Signed)
Carelink Summary Report / Loop Recorder 

## 2019-08-02 NOTE — Telephone Encounter (Signed)
Patient had vomiting last week and has been weak and not feeling well since.  She has been in the bed over the weekend. She had a telehealth visit today with her PCP office and they recommended ED evaluation for possible UTI and dehydration.  Son states that the patient doesn't want to go to the ED and would like to come here for an appt.  He is advised that they need to follow the recommendations from d=today's appt and take her to the ED for eval and possible fluids.  He verbalized understanding.

## 2019-08-02 NOTE — Patient Instructions (Signed)
Please go to the ED for further evaluation of nausea,vomiting,right side/mid abdominal pain,weakness,dizziness,poor appetite,urinary tract infection symptoms and generalized weakness.Call 9-1-1 for EMS or son may take you to Emergency room.

## 2019-08-09 ENCOUNTER — Other Ambulatory Visit: Payer: Self-pay

## 2019-08-09 ENCOUNTER — Other Ambulatory Visit: Payer: Medicare Other

## 2019-08-09 DIAGNOSIS — I10 Essential (primary) hypertension: Secondary | ICD-10-CM

## 2019-08-09 DIAGNOSIS — E1129 Type 2 diabetes mellitus with other diabetic kidney complication: Secondary | ICD-10-CM

## 2019-08-09 DIAGNOSIS — R7989 Other specified abnormal findings of blood chemistry: Secondary | ICD-10-CM | POA: Diagnosis not present

## 2019-08-09 DIAGNOSIS — I480 Paroxysmal atrial fibrillation: Secondary | ICD-10-CM

## 2019-08-12 ENCOUNTER — Encounter: Payer: Self-pay | Admitting: Internal Medicine

## 2019-08-12 ENCOUNTER — Ambulatory Visit (INDEPENDENT_AMBULATORY_CARE_PROVIDER_SITE_OTHER): Payer: Medicare Other | Admitting: Internal Medicine

## 2019-08-12 ENCOUNTER — Other Ambulatory Visit: Payer: Self-pay

## 2019-08-12 VITALS — BP 118/62 | HR 67 | Temp 93.7°F | Ht 66.0 in | Wt 181.5 lb

## 2019-08-12 DIAGNOSIS — N183 Chronic kidney disease, stage 3 unspecified: Secondary | ICD-10-CM

## 2019-08-12 DIAGNOSIS — D500 Iron deficiency anemia secondary to blood loss (chronic): Secondary | ICD-10-CM | POA: Diagnosis not present

## 2019-08-12 DIAGNOSIS — E1122 Type 2 diabetes mellitus with diabetic chronic kidney disease: Secondary | ICD-10-CM

## 2019-08-12 DIAGNOSIS — E1121 Type 2 diabetes mellitus with diabetic nephropathy: Secondary | ICD-10-CM | POA: Diagnosis not present

## 2019-08-12 DIAGNOSIS — B0229 Other postherpetic nervous system involvement: Secondary | ICD-10-CM

## 2019-08-12 DIAGNOSIS — N1831 Chronic kidney disease, stage 3a: Secondary | ICD-10-CM | POA: Diagnosis not present

## 2019-08-12 DIAGNOSIS — E039 Hypothyroidism, unspecified: Secondary | ICD-10-CM

## 2019-08-12 DIAGNOSIS — R300 Dysuria: Secondary | ICD-10-CM | POA: Diagnosis not present

## 2019-08-12 DIAGNOSIS — E1142 Type 2 diabetes mellitus with diabetic polyneuropathy: Secondary | ICD-10-CM | POA: Diagnosis not present

## 2019-08-12 DIAGNOSIS — I63442 Cerebral infarction due to embolism of left cerebellar artery: Secondary | ICD-10-CM

## 2019-08-12 DIAGNOSIS — N3010 Interstitial cystitis (chronic) without hematuria: Secondary | ICD-10-CM

## 2019-08-12 LAB — MICROALBUMIN / CREATININE URINE RATIO
Creatinine, Urine: 66 mg/dL (ref 20–275)
Microalb Creat Ratio: 148 mcg/mg creat — ABNORMAL HIGH (ref <30)
Microalb, Ur: 9.8 mg/dL

## 2019-08-12 LAB — CBC WITH DIFFERENTIAL/PLATELET
Absolute Monocytes: 861 cells/uL (ref 200–950)
Basophils Absolute: 70 cells/uL (ref 0–200)
Basophils Relative: 0.8 %
Eosinophils Absolute: 261 cells/uL (ref 15–500)
Eosinophils Relative: 3 %
HCT: 30.8 % — ABNORMAL LOW (ref 35.0–45.0)
Hemoglobin: 9.8 g/dL — ABNORMAL LOW (ref 11.7–15.5)
Lymphs Abs: 1192 cells/uL (ref 850–3900)
MCH: 29.4 pg (ref 27.0–33.0)
MCHC: 31.8 g/dL — ABNORMAL LOW (ref 32.0–36.0)
MCV: 92.5 fL (ref 80.0–100.0)
MPV: 11.4 fL (ref 7.5–12.5)
Monocytes Relative: 9.9 %
Neutro Abs: 6316 cells/uL (ref 1500–7800)
Neutrophils Relative %: 72.6 %
Platelets: 261 10*3/uL (ref 140–400)
RBC: 3.33 10*6/uL — ABNORMAL LOW (ref 3.80–5.10)
RDW: 14.2 % (ref 11.0–15.0)
Total Lymphocyte: 13.7 %
WBC: 8.7 10*3/uL (ref 3.8–10.8)

## 2019-08-12 LAB — LIPID PANEL
Cholesterol: 160 mg/dL (ref <200)
HDL: 51 mg/dL (ref >=50)
LDL Cholesterol (Calc): 83 mg/dL (calc)
Non-HDL Cholesterol (Calc): 109 mg/dL (calc) (ref <130)
Total CHOL/HDL Ratio: 3.1 (calc) (ref <5.0)
Triglycerides: 161 mg/dL — ABNORMAL HIGH (ref <150)

## 2019-08-12 LAB — TEST AUTHORIZATION

## 2019-08-12 LAB — HEMOGLOBIN A1C
Hgb A1c MFr Bld: 6.5 % of total Hgb — ABNORMAL HIGH (ref <5.7)
Mean Plasma Glucose: 140 (calc)
eAG (mmol/L): 7.7 (calc)

## 2019-08-12 LAB — COMPLETE METABOLIC PANEL WITH GFR
AG Ratio: 1.5 (calc) (ref 1.0–2.5)
ALT: 8 U/L (ref 6–29)
AST: 11 U/L (ref 10–35)
Albumin: 3.7 g/dL (ref 3.6–5.1)
Alkaline phosphatase (APISO): 82 U/L (ref 37–153)
BUN/Creatinine Ratio: 18 (calc) (ref 6–22)
BUN: 25 mg/dL (ref 7–25)
CO2: 24 mmol/L (ref 20–32)
Calcium: 9.5 mg/dL (ref 8.6–10.4)
Chloride: 102 mmol/L (ref 98–110)
Creat: 1.42 mg/dL — ABNORMAL HIGH (ref 0.60–0.88)
GFR, Est African American: 39 mL/min/{1.73_m2} — ABNORMAL LOW (ref >=60)
GFR, Est Non African American: 34 mL/min/{1.73_m2} — ABNORMAL LOW (ref >=60)
Globulin: 2.4 g/dL (calc) (ref 1.9–3.7)
Glucose, Bld: 125 mg/dL — ABNORMAL HIGH (ref 65–99)
Potassium: 5 mmol/L (ref 3.5–5.3)
Sodium: 136 mmol/L (ref 135–146)
Total Bilirubin: 0.4 mg/dL (ref 0.2–1.2)
Total Protein: 6.1 g/dL (ref 6.1–8.1)

## 2019-08-12 LAB — T4: T4, Total: 6.7 ug/dL (ref 5.1–11.9)

## 2019-08-12 LAB — TSH: TSH: 5.36 mIU/L — ABNORMAL HIGH (ref 0.40–4.50)

## 2019-08-12 LAB — T3: T3, Total: 117 ng/dL (ref 76–181)

## 2019-08-12 NOTE — Patient Instructions (Addendum)
Follow-up with Dr. Havery Moros due to your iron deficiency anemia.  Some of your weakness is likely due to your low blood count.  Please bring [place] a copy of your living will and HCPOA.    Continue to hydrate well and we will call you when your urine results return (about 3 days sometimes).

## 2019-08-12 NOTE — Progress Notes (Signed)
Location:  [facility] clinic Provider:  Thompson Mckim L. Mariea Clonts, D.O., C.M.D.  Code Status: DNR Goals of Care:  Advanced Directives 08/12/2019  Does Patient Have a Medical Advance Directive? No  Type of Advance Directive -  Does patient want to make changes to medical advance directive? No - Patient declined  Copy of [place] in Chart? -  Would patient like information on creating a medical advance directive? Yes (ED - Information included in AVS)     Chief Complaint  Patient presents with  . Medical Management of Chronic Issues    4 month follow up with lab results     HPI: Patient is a 86 y.o. female seen today for medical management of chronic diseases and to review labs.  Pt told CMA she's had burning when she urinates for 3 weeks.     She was here 10 days ago with generalized weakness, nausea and vomiting and NP wanted her to go to the ED for dehydration, but family could not be reached.  GI office told her the same thing.  They did not want to take her to the ED.  She took pedialyte and felt better.  She'd had a lot of gas.    She was on chronic nitrofurantoin for UTI prophylaxis thru urology (sounds like it's not really chronic but being used prn for symptoms), but it did not stop the burning this time.  Her energy won't come back.  She can turn over in bed and have to urinate.  She is drinking a lot of water.  She also sometimes feels like she has to go and can't.    She is only staying up 4-6 hrs a day and that might be stretching it per her son who's with her today.  She is fatigued.   hba1c has been 6.5.  No lows lately.  Feels like everything is good except her bladder and no energy.  Swept the kitchen and had to lie down and same happened after sweeping the bathroom.    She'd been to Dr. Havery Moros due to her anemia.  Did not see him for f/u after blood in her stool b/c she got the shingles.  She had two transfusions--did feel better and had more energy.  hgb in Jan  was 13.6 and then 9.8 today.  Shingles about knocked her out.  Still has discomfort on the left arm--movement is better--oddly she had difficulty with ROM during the shingles.  Still itches, too  If rubs it, it is partially numb.    She has some mild confusion and short-term memory loss since her stroke.  It's worse when she's ill for any other reason.  Past Medical History:  Diagnosis Date  . Allergic rhinitis, cause unspecified   . Anxiety state, unspecified   . Arthritis   . Colon polyp   . Diaphragmatic hernia without mention of obstruction or gangrene   . Diverticulosis of colon (without mention of hemorrhage)   . Female stress incontinence   . HTN (hypertension)   . Insomnia, unspecified   . Interstitial cystitis    surgery 02/01/2013  . Irritable bowel syndrome   . Memory loss   . Other and unspecified hyperlipidemia   . Other premature beats   . Polyneuropathy in diabetes(357.2)   . Postmenopausal bleeding   . Proteinuria   . Rectal ulcer   . Skin cancer   . Stroke ([place]) 2018  . Type II or unspecified type diabetes mellitus with neurological  manifestations, not stated as uncontrolled(250.60)   . Unspecified essential hypertension   . Viral warts, unspecified     Past Surgical History:  Procedure Laterality Date  . ABDOMINAL HYSTERECTOMY  1977   Dr.Hambright  . APPENDECTOMY  1965  . CATARACT EXTRACTION, BILATERAL  02/10/2006   Dr.Groat   . COLONOSCOPY  07/17/2004   polypectomy  . [place]  . LOOP RECORDER INSERTION N/A 07/31/2016   Procedure: Loop Recorder Insertion;  Surgeon: [name], MD;  Location: [place] [facility];  Service: Cardiovascular;  Laterality: N/A;  . TEE WITHOUT CARDIOVERSION N/A 07/30/2016   Procedure: TRANSESOPHAGEAL ECHOCARDIOGRAM (TEE);  Surgeon: [name], MD;  Location: [facility];  Service: Cardiovascular;  Laterality: N/A;  . [place]  . TRIGGER FINGER RELEASE  05/13/16  .  urinary bladder      surgery 02/01/2013 to stretch bladder stem    Allergies  Allergen Reactions  . Jardiance [Empagliflozin] Diarrhea  . Metformin And Related Diarrhea  . Ceclor [Cefaclor] Other (See Comments)    Unknown reaction; not recalled  . Codeine Nausea And Vomiting  . Lopid [Gemfibrozil] Other (See Comments)    Unknown reaction; not recalled  . Metoprolol Nausea And Vomiting  . Morphine And Related Other (See Comments)    Passed out  . Niacin Nausea And Vomiting    Unknown reaction Unknown reaction; not recalled  . Niacin And Related Other (See Comments)    Unknown reaction; not recalled  . Relafen [Nabumetone] Nausea And Vomiting  . Septra [Sulfamethoxazole-Trimethoprim] Nausea And Vomiting  . Tetracycline Nausea And Vomiting  . Toprol Xl [Metoprolol Tartrate] Nausea And Vomiting  . Adhesive [Tape] Rash and Other (See Comments)    Tears the skin also    Outpatient Encounter Medications as of 08/12/2019  Medication Sig  . ACCU-CHEK AVIVA PLUS test strip USE TO TEST BLOOD SUGAR TWICE DAILY.  Marland Kitchen amLODipine (NORVASC) 2.5 MG tablet Take 1 tablet (2.5 mg total) by mouth daily.  Marland Kitchen atorvastatin (LIPITOR) 40 MG tablet TAKE 1 TABLET DAILY AT 6 P.M.  . citalopram (CELEXA) 10 MG tablet TAKE 1 TABLET DAILY  . ELIQUIS 5 MG TABS tablet TAKE 1 TABLET TWICE A DAY  . gabapentin (NEURONTIN) 300 MG capsule Take 1 capsule (300 mg total) by mouth 3 (three) times daily.  . Lancets (ACCU-CHEK MULTICLIX) lancets Use to test blood sugar twice daily. E11.29  . omeprazole (PRILOSEC) 20 MG capsule Take 20 mg by mouth 2 (two) times daily.  . pioglitazone (ACTOS) 15 MG tablet Take 15 mg by mouth daily.  . Vitamin D, Ergocalciferol, (DRISDOL) 1.25 MG (50000 UNIT) CAPS capsule TAKE 1 CAPSULE ONCE WEEKLY FOR VITAMIN D   No facility-administered encounter medications on file as of 08/12/2019.    Review of Systems:  Review of Systems  Constitutional: Positive for malaise/fatigue. Negative for  chills and fever.  HENT: Negative for congestion and sore throat.   Eyes: Negative for blurred vision.  Respiratory: Negative for cough and shortness of breath.   Cardiovascular: Negative for chest pain, palpitations, orthopnea, leg swelling and PND.  Gastrointestinal: Negative for abdominal pain, blood in stool, constipation, diarrhea and melena.       No further episodes of BRBPR or melena  Genitourinary: Positive for dysuria, frequency and urgency. Negative for hematuria.       Nocturia  Musculoskeletal: Positive for joint pain. Negative for falls.       Left shoulder  s/p shingles  Skin: Positive for itching. Negative for rash.       Left shoulder   Neurological: Positive for tingling and sensory change. Negative for dizziness and loss of consciousness.  Endo/Heme/Allergies: Bruises/bleeds easily.  Psychiatric/Behavioral: Positive for memory loss. Negative for depression. The patient is not nervous/anxious and does not have insomnia.     Health Maintenance  Topic Date Due  . PNA vac Low Risk Adult (2 of 2 - PPSV23) 05/07/2017  . TETANUS/TDAP  09/15/2018  . OPHTHALMOLOGY EXAM  09/20/2018  . FOOT EXAM  12/16/2018  . INFLUENZA VACCINE  10/31/2019  . HEMOGLOBIN A1C  02/09/2020  . URINE MICROALBUMIN  08/08/2020  . DEXA SCAN  Completed  . COVID-19 Vaccine  Completed    Physical Exam: Vitals:   08/12/19 1001  BP: 118/62  Pulse: 67  Temp: (!) 93.7 F (34.3 C)  TempSrc: Temporal  SpO2: 97%  Weight: 181 lb 8 oz (82.3 kg)  Height: 5\' 6"  (1.676 m)   Body mass index is 29.29 kg/m. Physical Exam Vitals reviewed.  Constitutional:      General: She is not in acute distress.    Appearance: She is not toxic-appearing.     Comments: pale  HENT:     Head: Normocephalic and atraumatic.  Eyes:     Extraocular Movements: Extraocular movements intact.     Conjunctiva/sclera: Conjunctivae normal.     Pupils: Pupils are equal, round, and reactive to light.  Cardiovascular:     Rate  and Rhythm: Normal rate and regular rhythm.     Heart sounds: Murmur present.  Pulmonary:     Effort: Pulmonary effort is normal.     Breath sounds: Normal breath sounds. No wheezing, rhonchi or rales.  Abdominal:     General: Bowel sounds are normal.     Palpations: Abdomen is soft.  Musculoskeletal:        General: Normal range of motion.     Right lower leg: No edema.     Left lower leg: No edema.  Skin:    General: Skin is warm and dry.     Coloration: Skin is pale.  Neurological:     General: No focal deficit present.     Mental Status: She is alert and oriented to person, place, and time.     Cranial Nerves: No cranial nerve deficit.     Motor: No weakness.     Gait: Gait normal.     Comments: Some short-term memory loss (not a perfect historian)  Psychiatric:        Mood and Affect: Mood normal.        Behavior: Behavior normal.     Labs reviewed: Basic Metabolic Panel: Recent Labs    11/02/18 1408 11/11/18 0846 08/09/19 0903  NA 138 136 136  K 5.2 4.9 5.0  CL 105 99 102  CO2 27 21 24   GLUCOSE 122 194* 125*  BUN 26* 25 25  CREATININE 1.40* 1.61* 1.42*  CALCIUM 9.8 9.8 9.5  TSH  --   --  5.36*   Liver Function Tests: Recent Labs    11/02/18 1408 08/09/19 0903  AST 14 11  ALT 15 8  BILITOT 0.4 0.4  PROT 6.5 6.1   No results for input(s): LIPASE, AMYLASE in the last 8760 hours. No results for input(s): AMMONIA in the last 8760 hours. CBC: Recent Labs    01/29/19 1535 04/12/19 1115 08/09/19 0903  WBC 9.4 7.9 8.7  NEUTROABS 6.4 5.4  6,316  HGB 12.7 13.6 9.8*  HCT 38.3 40.8 30.8*  MCV 89.1 94.4 92.5  PLT 166.0 164.0 261   Lipid Panel: Recent Labs    08/09/19 0903  CHOL 160  HDL 51  LDLCALC 83  TRIG 161*  CHOLHDL 3.1   Lab Results  Component Value Date   HGBA1C 6.5 (H) 08/09/2019    Procedures since last visit: CUP PACEART REMOTE DEVICE CHECK  Result Date: 07/29/2019 [facility] summary report received. Battery status OK. Normal  device function. No new symptom episodes, tachy episodes, brady, or pause episodes. No new AF episodes. Monthly summary reports and ROV/PRN   Assessment/Plan 1. Dysuria - chart indicates urology had diagnosed her with chronic interstitial cystitis and it appears she was meant to take nitrofurantoin daily--not on anymore--she'd stopped it after using it on prn basis for symptoms and not getting relief from that - Urine Culture - Urinalysis, Routine w reflex microscopic  2. Chronic interstitial cystitis - r/o acute infection which it sounds like she may now have--if no growth on culture, may need to f/u with urology for change in symptoms - Urine Culture - Urinalysis, Routine w reflex microscopic  3. Iron deficiency anemia due to chronic blood loss - appear this may be the source of her low energy at this point, f/u with GI--pt and her son wanted [place] to call to get her an appt scheduled - CBC with Differential/Platelet; Future  4. Diabetic polyneuropathy associated with type 2 diabetes mellitus ([place]) -control is very good on actos only--no recent challenges with volume overload Lab Results  Component Value Date   HGBA1C 6.5 (H) 08/09/2019  - Hemoglobin A1c; Future  5. Type 2 diabetes mellitus with stage 3a chronic kidney disease, without long-term current use of insulin (HCC) - cont actos, lipitor - COMPLETE METABOLIC PANEL WITH GFR; Future - Hemoglobin A1c; Future - Lipid panel; Future  6. Chronic kidney disease (CKD), active medical management without dialysis, stage 3 (moderate) (HCC) -Avoid nephrotoxic agents like nsaids, dose adjust renally excreted meds, hydrate.  7. Acquired hypothyroidism - f/u lab--not on levothyroxine at this time, but tsh slightly high Lab Results  Component Value Date   TSH 5.36 (H) 08/09/2019  - TSH; Future  8. Postherpetic neuralgia -of left shoulder--already on gabapentin 300mg  po tid from Dr. Ubaldo Glassing with derm  Labs/tests ordered:   Lab Orders      Urine Culture     MICROSCOPIC MESSAGE     CBC with Differential/Platelet     COMPLETE METABOLIC PANEL WITH GFR     Hemoglobin A1c     Lipid panel     TSH     Urinalysis, Routine w reflex microscopic  Next appt: 12/20/2019 med mgt, fasting labs before  50 mins spent reviewing notes on pt--I had not seen her in over a year myself.  Oliver Heitzenrater L. Zyrus Hetland, D.O. [facility] [address], [place] [address] Cell Phone (Mon-Fri 8am-5pm):  [phone] On Call:  [phone] & follow prompts after 5pm & weekends Office Phone:  [phone] Office Fax:  [phone]

## 2019-08-13 LAB — URINALYSIS, ROUTINE W REFLEX MICROSCOPIC
Bilirubin Urine: NEGATIVE
Glucose, UA: NEGATIVE
Ketones, ur: NEGATIVE
Nitrite: POSITIVE — AB
Specific Gravity, Urine: 1.012 (ref 1.001–1.03)
WBC, UA: >=60 /HPF — AB (ref 0–5)
pH: 6.5 (ref 5.0–8.0)

## 2019-08-13 LAB — URINE CULTURE
MICRO NUMBER:: 10477175
SPECIMEN QUALITY:: ADEQUATE

## 2019-08-13 LAB — MICROSCOPIC MESSAGE

## 2019-08-13 NOTE — Progress Notes (Signed)
Please notify her son, [name], or granddaughter, Juliane Guest:  The initial part of the urine test was definitely positive for infection.  Await final culture so we don't create antibiotic resistance.

## 2019-08-14 NOTE — Progress Notes (Signed)
The urine culture showed mixed organisms which indicates either contamination (not really a clean urine collection) or that she is colonized with several bacteria in her bladder, but does not have a true infection.  She does have her history of chronic interstitial cystitis on record which will cause longstanding dysuria along with atrophy of the tissues "down there" after menopause.  I suspect her fatigue and malaise are more related to her chronic anemia. Can we please make her an appt with GI for follow-up for this?  Thanks.

## 2019-08-15 DIAGNOSIS — B0229 Other postherpetic nervous system involvement: Secondary | ICD-10-CM | POA: Insufficient documentation

## 2019-08-15 DIAGNOSIS — E039 Hypothyroidism, unspecified: Secondary | ICD-10-CM | POA: Insufficient documentation

## 2019-08-15 DIAGNOSIS — D500 Iron deficiency anemia secondary to blood loss (chronic): Secondary | ICD-10-CM | POA: Insufficient documentation

## 2019-08-16 ENCOUNTER — Telehealth: Payer: Self-pay

## 2019-08-16 ENCOUNTER — Other Ambulatory Visit: Payer: Self-pay

## 2019-08-16 DIAGNOSIS — D509 Iron deficiency anemia, unspecified: Secondary | ICD-10-CM

## 2019-08-16 DIAGNOSIS — R1032 Left lower quadrant pain: Secondary | ICD-10-CM

## 2019-08-16 DIAGNOSIS — K2970 Gastritis, unspecified, without bleeding: Secondary | ICD-10-CM

## 2019-08-16 DIAGNOSIS — R6889 Other general symptoms and signs: Secondary | ICD-10-CM

## 2019-08-16 DIAGNOSIS — K5520 Angiodysplasia of colon without hemorrhage: Secondary | ICD-10-CM

## 2019-08-16 NOTE — Telephone Encounter (Signed)
Appointment for follow up on 09/28/2019. They are requesting progress notes be faxed over before visit to [phone]

## 2019-08-16 NOTE — Telephone Encounter (Signed)
-----   Message from Yetta Flock, MD sent at 08/16/2019  7:31 AM EDT ----- Mary Sella can you help with the following: - she needs a CBC this week and would like to add TIBC / ferritin and B12. Can you clarify if she is taking oral iron or not - will need to get her a follow up visit with me in 2 weeks or so, will look at schedule, or have her see a PA.  Thanks ----- Message ----- From: [name], DO Sent: 08/15/2019   3:03 PM EDT To: Yetta Flock, MD

## 2019-08-16 NOTE — Telephone Encounter (Signed)
Notes are all here in [facility].

## 2019-08-16 NOTE — Telephone Encounter (Signed)
Labs entered.  Called and spoke to pt.  She understands to go to the lab in our basement one day this week. Scheduled her for a F/U appt.

## 2019-08-16 NOTE — Telephone Encounter (Signed)
Called pt.  LM to call back to clarify if she is taking oral iron. If so, dose and how often?

## 2019-08-17 NOTE — Telephone Encounter (Signed)
Okay thanks. Will await her labs this week but she needs to be on iron. Not sure if there is another formulation she may want to try, or use lower dose of once daily if she cannot tolerate it a few times per day. If she can't, then will consider IV iron infusion if her labs confirm deficiency.thanks

## 2019-08-17 NOTE — Telephone Encounter (Signed)
Called pt. Spoke to her son.  He confirmed she is not taking oral iron. He reports that it upset her stomach so she stopped taking it "a while a go".  She has an upcoming appt with A [place] on 6-9 and will discuss further at that time.

## 2019-08-18 NOTE — Telephone Encounter (Signed)
Called and spoke to pt's son, [name].  Expressed to him the importance of pt being on iron.  He will see if she still has some and will see if she can try again to take it at least once a day.  They will discuss if she is able to do that at her upcoming visit on 6-9.

## 2019-08-19 ENCOUNTER — Other Ambulatory Visit (INDEPENDENT_AMBULATORY_CARE_PROVIDER_SITE_OTHER): Payer: Medicare Other

## 2019-08-19 DIAGNOSIS — R6889 Other general symptoms and signs: Secondary | ICD-10-CM

## 2019-08-19 DIAGNOSIS — K5520 Angiodysplasia of colon without hemorrhage: Secondary | ICD-10-CM

## 2019-08-19 DIAGNOSIS — K2970 Gastritis, unspecified, without bleeding: Secondary | ICD-10-CM | POA: Diagnosis not present

## 2019-08-19 DIAGNOSIS — K2990 Gastroduodenitis, unspecified, without bleeding: Secondary | ICD-10-CM | POA: Diagnosis not present

## 2019-08-19 DIAGNOSIS — D509 Iron deficiency anemia, unspecified: Secondary | ICD-10-CM | POA: Diagnosis not present

## 2019-08-19 DIAGNOSIS — R1032 Left lower quadrant pain: Secondary | ICD-10-CM

## 2019-08-19 LAB — CBC WITH DIFFERENTIAL/PLATELET
Basophils Absolute: 0.1 10*3/uL (ref 0.0–0.1)
Basophils Relative: 1.2 % (ref 0.0–3.0)
Eosinophils Absolute: 0.2 10*3/uL (ref 0.0–0.7)
Eosinophils Relative: 2.5 % (ref 0.0–5.0)
HCT: 29.2 % — ABNORMAL LOW (ref 36.0–46.0)
Hemoglobin: 9.7 g/dL — ABNORMAL LOW (ref 12.0–15.0)
Lymphocytes Relative: 13.6 % (ref 12.0–46.0)
Lymphs Abs: 1 10*3/uL (ref 0.7–4.0)
MCHC: 33.3 g/dL (ref 30.0–36.0)
MCV: 89.7 fl (ref 78.0–100.0)
Monocytes Absolute: 0.9 10*3/uL (ref 0.1–1.0)
Monocytes Relative: 11.4 % (ref 3.0–12.0)
Neutro Abs: 5.4 10*3/uL (ref 1.4–7.7)
Neutrophils Relative %: 71.3 % (ref 43.0–77.0)
Platelets: 259 10*3/uL (ref 150.0–400.0)
RBC: 3.26 Mil/uL — ABNORMAL LOW (ref 3.87–5.11)
RDW: 16.1 % — ABNORMAL HIGH (ref 11.5–15.5)
WBC: 7.5 10*3/uL (ref 4.0–10.5)

## 2019-08-19 LAB — IBC + FERRITIN
Ferritin: 7.3 ng/mL — ABNORMAL LOW (ref 10.0–291.0)
Iron: 29 ug/dL — ABNORMAL LOW (ref 42–145)
Saturation Ratios: 6.1 % — ABNORMAL LOW (ref 20.0–50.0)
Transferrin: 339 mg/dL (ref 212.0–360.0)

## 2019-08-19 LAB — VITAMIN B12: Vitamin B-12: 167 pg/mL — ABNORMAL LOW (ref 211–911)

## 2019-08-23 ENCOUNTER — Other Ambulatory Visit: Payer: Self-pay | Admitting: *Deleted

## 2019-08-23 MED ORDER — AMLODIPINE BESYLATE 2.5 MG PO TABS: 2.5000 mg | ORAL_TABLET | Freq: Every day | ORAL | 0 refills | Status: DC

## 2019-08-23 MED ORDER — AMLODIPINE BESYLATE 2.5 MG PO TABS: 2.5000 mg | ORAL_TABLET | Freq: Every day | ORAL | 3 refills | Status: DC

## 2019-08-23 NOTE — Telephone Encounter (Signed)
Patient requested refill. Ran out and needs 30 day supply also sent to Local pharmacy.

## 2019-08-23 NOTE — Telephone Encounter (Signed)
Pharmacy did not receive previous Rx. Resent

## 2019-08-24 ENCOUNTER — Other Ambulatory Visit: Payer: Self-pay

## 2019-08-24 DIAGNOSIS — D509 Iron deficiency anemia, unspecified: Secondary | ICD-10-CM

## 2019-08-26 DIAGNOSIS — R82998 Other abnormal findings in urine: Secondary | ICD-10-CM | POA: Diagnosis not present

## 2019-08-26 DIAGNOSIS — N3010 Interstitial cystitis (chronic) without hematuria: Secondary | ICD-10-CM | POA: Diagnosis not present

## 2019-08-26 DIAGNOSIS — R399 Unspecified symptoms and signs involving the genitourinary system: Secondary | ICD-10-CM | POA: Diagnosis not present

## 2019-08-30 LAB — CUP PACEART REMOTE DEVICE CHECK
Date Time Interrogation Session: 20210529232829
Implantable Pulse Generator Implant Date: 20180502

## 2019-08-31 ENCOUNTER — Ambulatory Visit (INDEPENDENT_AMBULATORY_CARE_PROVIDER_SITE_OTHER): Payer: Medicare Other | Admitting: *Deleted

## 2019-08-31 DIAGNOSIS — I639 Cerebral infarction, unspecified: Secondary | ICD-10-CM

## 2019-09-01 NOTE — Progress Notes (Signed)
Carelink Summary Report / Loop Recorder 

## 2019-09-04 ENCOUNTER — Other Ambulatory Visit (HOSPITAL_COMMUNITY): Payer: Self-pay | Admitting: Nurse Practitioner

## 2019-09-04 ENCOUNTER — Other Ambulatory Visit: Payer: Self-pay | Admitting: Internal Medicine

## 2019-09-06 ENCOUNTER — Telehealth: Payer: Self-pay | Admitting: *Deleted

## 2019-09-06 NOTE — Telephone Encounter (Signed)
rx sent to pharmacy by e-script  

## 2019-09-06 NOTE — Telephone Encounter (Signed)
Patient called requesting refill on her Amlodipine. Stated that [facility] has sent it out on 6/5 but she doesn't have enough to last her until she gets it.   I reviewed chart and on 5/24 # 30 was sent to [facility]. I called and confirmed and they stated that patient had picked up on 5/24.  I spoke with patient and confirmed and she said yes she had. I explained to patient that she should have enough to last her until she received her medication and she stated that she only had 2 pills left.  Confirmed her dosage and she has been taking medication TWICE daily instead of Once daily.   Stated that she has always taken it twice daily. Current medication list is different.   Please Advise.

## 2019-09-06 NOTE — Telephone Encounter (Signed)
Pt last saw Dr Lovena Le 04/20/19, last labs 08/09/19 Creat 1.42, age 86, weight 82.3kg, based on specified criteria pt is on appropriate dosage of Eliquis 5mg  BID.  Will refill rx.

## 2019-09-06 NOTE — Telephone Encounter (Signed)
It seems like amlodipine 2.5mg  po bid is working ok for her so let's keep it like it is though that is not typical.  Please send her some more locally and update the Rx for mail order, as well.  Thanks.

## 2019-09-07 MED ORDER — AMLODIPINE BESYLATE 2.5 MG PO TABS: 2.5000 mg | ORAL_TABLET | Freq: Two times a day (BID) | ORAL | 1 refills | Status: DC

## 2019-09-07 MED ORDER — AMLODIPINE BESYLATE 2.5 MG PO TABS: 2.5000 mg | ORAL_TABLET | Freq: Two times a day (BID) | ORAL | 0 refills | Status: DC

## 2019-09-07 NOTE — Telephone Encounter (Signed)
Medication list updated and Rx sent to local pharmacy and mail order.  Patient notified and agreed.

## 2019-09-08 ENCOUNTER — Ambulatory Visit: Payer: Medicare Other | Admitting: Physician Assistant

## 2019-09-08 ENCOUNTER — Telehealth: Payer: Self-pay

## 2019-09-08 NOTE — Telephone Encounter (Signed)
Spoke with patient to inform of disconnected monitor. °

## 2019-09-09 DIAGNOSIS — D509 Iron deficiency anemia, unspecified: Secondary | ICD-10-CM | POA: Diagnosis not present

## 2019-09-09 DIAGNOSIS — K2970 Gastritis, unspecified, without bleeding: Secondary | ICD-10-CM | POA: Diagnosis not present

## 2019-09-15 ENCOUNTER — Telehealth: Payer: Self-pay

## 2019-09-15 NOTE — Telephone Encounter (Signed)
Jim with [facility] called stating that patient has a new diagnosis of CHF and they usually don't give the medication pioglitazone with that diagnosis. Is it ok to send the medication? Please advise.  [facility] [phone] Ref# [id number]

## 2019-09-15 NOTE — Telephone Encounter (Signed)
This was discussed with the patient a few years ago and she opted to continue the medication.  The chf is not new for her.

## 2019-09-16 DIAGNOSIS — D509 Iron deficiency anemia, unspecified: Secondary | ICD-10-CM | POA: Diagnosis not present

## 2019-09-16 DIAGNOSIS — K2970 Gastritis, unspecified, without bleeding: Secondary | ICD-10-CM | POA: Diagnosis not present

## 2019-09-16 NOTE — Telephone Encounter (Signed)
[facility]ed [facility] and they will continue sending patient medication.

## 2019-09-28 ENCOUNTER — Encounter: Payer: Self-pay | Admitting: Podiatry

## 2019-09-28 ENCOUNTER — Ambulatory Visit (INDEPENDENT_AMBULATORY_CARE_PROVIDER_SITE_OTHER): Payer: Medicare Other | Admitting: Podiatry

## 2019-09-28 ENCOUNTER — Other Ambulatory Visit: Payer: Self-pay

## 2019-09-28 ENCOUNTER — Ambulatory Visit: Payer: Medicare Other | Admitting: Gastroenterology

## 2019-09-28 VITALS — Temp 96.0°F

## 2019-09-28 DIAGNOSIS — M79675 Pain in left toe(s): Secondary | ICD-10-CM

## 2019-09-28 DIAGNOSIS — B351 Tinea unguium: Secondary | ICD-10-CM

## 2019-09-28 DIAGNOSIS — M79674 Pain in right toe(s): Secondary | ICD-10-CM

## 2019-09-28 DIAGNOSIS — E1142 Type 2 diabetes mellitus with diabetic polyneuropathy: Secondary | ICD-10-CM

## 2019-09-28 NOTE — Patient Instructions (Signed)
Diabetes Mellitus and Foot Care Foot care is an important part of your health, especially when you have diabetes. Diabetes may cause you to have problems because of poor blood flow (circulation) to your feet and legs, which can cause your skin to:  Become thinner and drier.  Break more easily.  Heal more slowly.  Peel and crack. You may also have nerve damage (neuropathy) in your legs and feet, causing decreased feeling in them. This means that you may not notice minor injuries to your feet that could lead to more serious problems. Noticing and addressing any potential problems early is the best way to prevent future foot problems. How to care for your feet Foot hygiene  Wash your feet daily with warm water and mild soap. Do not use hot water. Then, pat your feet and the areas between your toes until they are completely dry. Do not soak your feet as this can dry your skin.  Trim your toenails straight across. Do not dig under them or around the cuticle. File the edges of your nails with an emery board or nail file.  Apply a moisturizing lotion or petroleum jelly to the skin on your feet and to dry, brittle toenails. Use lotion that does not contain alcohol and is unscented. Do not apply lotion between your toes. Shoes and socks  Wear clean socks or stockings every day. Make sure they are not too tight. Do not wear knee-high stockings since they may decrease blood flow to your legs.  Wear shoes that fit properly and have enough cushioning. Always look in your shoes before you put them on to be sure there are no objects inside.  To break in new shoes, wear them for just a few hours a day. This prevents injuries on your feet. Wounds, scrapes, corns, and calluses  Check your feet daily for blisters, cuts, bruises, sores, and redness. If you cannot see the bottom of your feet, use a mirror or ask someone for help.  Do not cut corns or calluses or try to remove them with medicine.  If you  find a minor scrape, cut, or break in the skin on your feet, keep it and the skin around it clean and dry. You may clean these areas with mild soap and water. Do not clean the area with peroxide, alcohol, or iodine.  If you have a wound, scrape, corn, or callus on your foot, look at it several times a day to make sure it is healing and not infected. Check for: ? Redness, swelling, or pain. ? Fluid or blood. ? Warmth. ? Pus or a bad smell. General instructions  Do not cross your legs. This may decrease blood flow to your feet.  Do not use heating pads or hot water bottles on your feet. They may burn your skin. If you have lost feeling in your feet or legs, you may not know this is happening until it is too late.  Protect your feet from hot and cold by wearing shoes, such as at the beach or on hot pavement.  Schedule a complete foot exam at least once a year (annually) or more often if you have foot problems. If you have foot problems, report any cuts, sores, or bruises to your health care provider immediately. Contact a health care provider if:  You have a medical condition that increases your risk of infection and you have any cuts, sores, or bruises on your feet.  You have an injury that is not   healing.  You have redness on your legs or feet.  You feel burning or tingling in your legs or feet.  You have pain or cramps in your legs and feet.  Your legs or feet are numb.  Your feet always feel cold.  You have pain around a toenail. Get help right away if:  You have a wound, scrape, corn, or callus on your foot and: ? You have pain, swelling, or redness that gets worse. ? You have fluid or blood coming from the wound, scrape, corn, or callus. ? Your wound, scrape, corn, or callus feels warm to the touch. ? You have pus or a bad smell coming from the wound, scrape, corn, or callus. ? You have a fever. ? You have a red line going up your leg. Summary  Check your feet every day  for cuts, sores, red spots, swelling, and blisters.  Moisturize feet and legs daily.  Wear shoes that fit properly and have enough cushioning.  If you have foot problems, report any cuts, sores, or bruises to your health care provider immediately.  Schedule a complete foot exam at least once a year (annually) or more often if you have foot problems. This information is not intended to replace advice given to you by your health care provider. Make sure you discuss any questions you have with your health care provider. Document Revised: 12/09/2018 Document Reviewed: 04/19/2016 Elsevier Patient Education  2020 [employer]  

## 2019-09-29 ENCOUNTER — Other Ambulatory Visit: Payer: Self-pay | Admitting: Internal Medicine

## 2019-09-29 DIAGNOSIS — I5033 Acute on chronic diastolic (congestive) heart failure: Secondary | ICD-10-CM

## 2019-09-29 NOTE — Telephone Encounter (Signed)
Medication was discontinued by patient

## 2019-09-30 ENCOUNTER — Other Ambulatory Visit: Payer: Self-pay | Admitting: Internal Medicine

## 2019-10-01 ENCOUNTER — Ambulatory Visit (INDEPENDENT_AMBULATORY_CARE_PROVIDER_SITE_OTHER): Payer: Medicare Other | Admitting: *Deleted

## 2019-10-01 DIAGNOSIS — I63442 Cerebral infarction due to embolism of left cerebellar artery: Secondary | ICD-10-CM | POA: Diagnosis not present

## 2019-10-01 DIAGNOSIS — R339 Retention of urine, unspecified: Secondary | ICD-10-CM | POA: Insufficient documentation

## 2019-10-01 LAB — CUP PACEART REMOTE DEVICE CHECK
Date Time Interrogation Session: 20210701231000
Implantable Pulse Generator Implant Date: 20180502

## 2019-10-02 NOTE — Progress Notes (Signed)
Subjective:  Patient ID: Penny Miller, female    DOB: 06/22/1931,  MRN: [id number]  86 y.o. female presents with preventative diabetic foot care and painful thick toenails that are difficult to trim. Pain interferes with ambulation. Aggravating factors include wearing enclosed shoe gear. Pain is relieved with periodic professional debridement.   She has h/o neuropathy and takes gabapentin for her symptoms.  Review of Systems: Negative except as noted in the HPI.  Past Medical History:  Diagnosis Date  . Allergic rhinitis, cause unspecified   . Anxiety state, unspecified   . Arthritis   . Colon polyp   . Diaphragmatic hernia without mention of obstruction or gangrene   . Diverticulosis of colon (without mention of hemorrhage)   . Female stress incontinence   . HTN (hypertension)   . Insomnia, unspecified   . Interstitial cystitis    surgery 02/01/2013  . Irritable bowel syndrome   . Memory loss   . Other and unspecified hyperlipidemia   . Other premature beats   . Polyneuropathy in diabetes(357.2)   . Postmenopausal bleeding   . Proteinuria   . Rectal ulcer   . Skin cancer   . Stroke ([place]) 2018  . 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 Unspecified essential hypertension   . Viral warts, unspecified    Past Surgical History:  Procedure Laterality Date  . ABDOMINAL HYSTERECTOMY  1977   Dr.Hambright  . APPENDECTOMY  1965  . CATARACT EXTRACTION, BILATERAL  02/10/2006   Dr.Groat   . COLONOSCOPY  07/17/2004   polypectomy  . [place]  . LOOP RECORDER INSERTION N/A 07/31/2016   Procedure: Loop Recorder Insertion;  Surgeon: [name], MD;  Location: [facility];  Service: Cardiovascular;  Laterality: N/A;  . TEE WITHOUT CARDIOVERSION N/A 07/30/2016   Procedure: TRANSESOPHAGEAL ECHOCARDIOGRAM (TEE);  Surgeon: [name], MD;  Location: [facility];  Service:  Cardiovascular;  Laterality: N/A;  . [place]  . TRIGGER FINGER RELEASE  05/13/16  . urinary bladder      surgery 02/01/2013 to stretch bladder stem   Patient Active Problem List   Diagnosis Date Noted  . Postherpetic neuralgia 08/15/2019  . Acquired hypothyroidism 08/15/2019  . Iron deficiency anemia due to chronic blood loss 08/15/2019  . Lumbar radiculopathy 05/26/2018  . Spinal stenosis of lumbar region with neurogenic claudication 07/21/2017  . Vertigo of central origin 07/21/2017  . Benign hypertension 07/21/2017  . Overweight (BMI 25.0-29.9) 11/07/2016  . Paroxysmal atrial fibrillation ([place]) 09/03/2016  . Diabetic polyneuropathy associated with type 2 diabetes mellitus ([place]) 09/03/2016  . Aneurysm of vertebral artery ([place]) 08/12/2016  . Embolic stroke involving left cerebellar artery ([place]) 07/29/2016  . Chronic kidney disease (CKD), active medical management without dialysis, stage 3 (moderate) ([place]) 05/08/2016  . Palpitations 06/27/2015  . Dyspnea 06/27/2015  . Trigger finger 06/27/2015  . History of fall 02/28/2015  . DM (diabetes mellitus), type 2 with renal complications ([place]) 56/31/4970  . Sinusitis, chronic 06/22/2014  . Varicose vein of leg 12/21/2013  . Seborrheic keratosis 12/21/2013  . Deaf 06/15/2013  . Bladder infection, chronic 01/04/2013  . Polyneuropathy in diabetes(357.2)   . Memory loss   . Insomnia, unspecified   . Essential hypertension   . Lipoma of unspecified site   . Hyperlipidemia   . Pain in joint, ankle and foot 11/20/2012  . Herpes zoster infection  of thoracic region 08/28/2012  . Chronic interstitial cystitis     Current Outpatient Medications:  .  citalopram (CELEXA) 10 MG tablet, TAKE 1 TABLET DAILY, Disp: 90 tablet, Rfl: 3 .  pioglitazone (ACTOS) 15 MG tablet, TAKE 1 TABLET DAILY, Disp: 90 tablet, Rfl: 3 .  ACCU-CHEK AVIVA PLUS test strip, USE TO TEST BLOOD SUGAR TWICE DAILY., Disp: 100 each, Rfl: 11 .   amLODipine (NORVASC) 2.5 MG tablet, Take 1 tablet (2.5 mg total) by mouth 2 (two) times daily., Disp: 180 tablet, Rfl: 1 .  atorvastatin (LIPITOR) 40 MG tablet, TAKE 1 TABLET DAILY AT 6 P.M., Disp: 90 tablet, Rfl: 3 .  ELIQUIS 5 MG TABS tablet, TAKE 1 TABLET TWICE A DAY, Disp: 180 tablet, Rfl: 1 .  gabapentin (NEURONTIN) 300 MG capsule, Take 1 capsule (300 mg total) by mouth 3 (three) times daily., Disp: 270 capsule, Rfl: 3 .  Lancets (ACCU-CHEK MULTICLIX) lancets, Use to test blood sugar twice daily. E11.29, Disp: 100 each, Rfl: 11 .  omeprazole (PRILOSEC) 20 MG capsule, Take 20 mg by mouth 2 (two) times daily., Disp: , Rfl:  .  Vitamin D, Ergocalciferol, (DRISDOL) 1.25 MG (50000 UNIT) CAPS capsule, TAKE 1 CAPSULE ONCE WEEKLY FOR VITAMIN D, Disp: 12 capsule, Rfl: 3 Allergies  Allergen Reactions  . Jardiance [Empagliflozin] Diarrhea  . Metformin And Related Diarrhea  . Ceclor [Cefaclor] Other (See Comments)    Unknown reaction; not recalled  . Codeine Nausea And Vomiting  . Lopid [Gemfibrozil] Other (See Comments)    Unknown reaction; not recalled  . Metoprolol Nausea And Vomiting  . Morphine And Related Other (See Comments)    Passed out  . Niacin Nausea And Vomiting    Unknown reaction Unknown reaction; not recalled  . Niacin And Related Other (See Comments)    Unknown reaction; not recalled  . Relafen [Nabumetone] Nausea And Vomiting  . Septra [Sulfamethoxazole-Trimethoprim] Nausea And Vomiting  . Tetracycline Nausea And Vomiting  . Toprol Xl [Metoprolol Tartrate] Nausea And Vomiting  . Adhesive [Tape] Rash and Other (See Comments)    Tears the skin also   Social History   Tobacco Use  Smoking Status Former Smoker  . Packs/day: 0.25  . Years: 62.00  . Pack years: 15.50  . Quit date: 04/02/2011  . Years since quitting: 8.5  Smokeless Tobacco Never Used  Tobacco Comment   stopped consistently smoking in 2012 but still smokes occasionally (one pack per week)    Objective:     Constitutional Pt is a pleasant 86 y.o. Caucasian female, in NAD.Marland Kitchen  Vascular Neurovascular status unchanged b/l lower extremities. Capillary refill time to digits <4 seconds b/l lower extremities. Palpable pedal pulses b/l LE. Pedal hair absent. Lower extremity skin temperature gradient within normal limits. No pain with calf compression b/l. No cyanosis or clubbing noted.  Neurologic Normal speech. Oriented to person, place, and time. Pt has subjective symptoms of neuropathy. Protective sensation intact 5/5 intact bilaterally with 10g monofilament b/l. Vibratory sensation intact b/l.  Dermatologic Pedal skin is thin shiny, atrophic b/l lower extremities. No open wounds bilaterally. No interdigital macerations bilaterally. Toenails 1-5 b/l elongated, discolored, dystrophic, thickened, crumbly with subungual debris and tenderness to dorsal palpation.  Orthopedic: Normal muscle strength 5/5 to all lower extremity muscle groups bilaterally. No pain crepitus or joint limitation noted with ROM b/l. No gross bony deformities bilaterally.   Radiographs: None Assessment:   1. Pain due to onychomycosis of toenails of both feet   2. Diabetic peripheral  neuropathy [facility])    Plan:  Patient was evaluated and treated and all questions answered.  Onychomycosis with pain -Nails palliatively debridement as below. -Educated on self-care  Procedure: Nail Debridement Rationale: Pain Type of Debridement: manual, sharp debridement. Instrumentation: Nail nipper, rotary burr. Number of Nails: 10  -Continue diabetic foot care principles. -Toenails 1-5 b/l were debrided in length and girth with sterile nail nippers and dremel without iatrogenic bleeding.  -Patient to continue soft, supportive shoe gear daily. -Patient/POA to call should there be question/concern in the interim.  Return in about 3 months (around 12/29/2019).  Marzetta Board, DPM

## 2019-10-05 NOTE — Progress Notes (Signed)
Carelink Summary Report / Loop Recorder 

## 2019-10-13 ENCOUNTER — Other Ambulatory Visit: Payer: Self-pay | Admitting: Nurse Practitioner

## 2019-10-13 DIAGNOSIS — R0602 Shortness of breath: Secondary | ICD-10-CM

## 2019-10-13 DIAGNOSIS — R601 Generalized edema: Secondary | ICD-10-CM

## 2019-10-15 ENCOUNTER — Other Ambulatory Visit (INDEPENDENT_AMBULATORY_CARE_PROVIDER_SITE_OTHER): Payer: Medicare Other

## 2019-10-15 DIAGNOSIS — D509 Iron deficiency anemia, unspecified: Secondary | ICD-10-CM

## 2019-10-15 LAB — CBC
HCT: 38.1 % (ref 36.0–46.0)
Hemoglobin: 12.7 g/dL (ref 12.0–15.0)
MCHC: 33.4 g/dL (ref 30.0–36.0)
MCV: 94.1 fl (ref 78.0–100.0)
Platelets: 156 10*3/uL (ref 150.0–400.0)
RBC: 4.05 Mil/uL (ref 3.87–5.11)
RDW: 20.2 % — ABNORMAL HIGH (ref 11.5–15.5)
WBC: 6.4 10*3/uL (ref 4.0–10.5)

## 2019-11-02 ENCOUNTER — Encounter: Payer: Self-pay | Admitting: Gastroenterology

## 2019-11-02 ENCOUNTER — Ambulatory Visit (INDEPENDENT_AMBULATORY_CARE_PROVIDER_SITE_OTHER): Payer: Medicare Other | Admitting: Gastroenterology

## 2019-11-02 VITALS — BP 112/72 | HR 68 | Ht 67.0 in | Wt 173.1 lb

## 2019-11-02 DIAGNOSIS — K5730 Diverticulosis of large intestine without perforation or abscess without bleeding: Secondary | ICD-10-CM | POA: Diagnosis not present

## 2019-11-02 DIAGNOSIS — Z7901 Long term (current) use of anticoagulants: Secondary | ICD-10-CM

## 2019-11-02 DIAGNOSIS — D509 Iron deficiency anemia, unspecified: Secondary | ICD-10-CM | POA: Diagnosis not present

## 2019-11-02 DIAGNOSIS — I639 Cerebral infarction, unspecified: Secondary | ICD-10-CM

## 2019-11-02 DIAGNOSIS — E538 Deficiency of other specified B group vitamins: Secondary | ICD-10-CM | POA: Diagnosis not present

## 2019-11-02 NOTE — Patient Instructions (Signed)
If you are age 84 or older, your body mass index should be between 23-30. Your Body mass index is 27.12 kg/m. If this is out of the aforementioned range listed, please consider follow up with your Primary Care Provider.  If you are age 71 or younger, your body mass index should be between 19-25. Your Body mass index is 27.12 kg/m. If this is out of the aformentioned range listed, please consider follow up with your Primary Care Provider.   Please continue oral B12 supplementation, 1000 mcg daily.  Take a daily multi-vitamin with extra IRON  Take a daily fiber supplement.  Please go to the lab in the basement of our building in 1 month (around September 1st)  for a CBC. They are open Monday through Friday from 8:00 am to 5:00 pm.  Thank you for entrusting me with your care and for choosing [facility], Dr. [place] Cellar

## 2019-11-02 NOTE — Progress Notes (Signed)
HPI :  86 year old female here for follow-up visit for iron deficiency anemia. She takes Eliquis for history of CVA and atrial fibrillation.  She underwent an EGD and colonoscopy as outlined last year for this issue:  EGD 12/16/18 - - Localized mucosal change, around 71mm in diameter, were found in the middle third of the esophagus around 26cm of the incisors. Biopsies show glycogenic acanthosis. The exam of the esophagus was otherwise normal. Gastritis, negative biopsies for HP. A large diverticulum was found in the second portion of the duodenum. The exam of the duodenum was otherwise normal.  Colonoscopy 12/16/18 - Four non-bleeding colonic angiodysplastic lesions, one of them quite large at the IC valve. One 5 mm polyp in the descending colon. Diverticulosis in the sigmoid colon. Friability in the sigmoid colon with inflammatory changes - I suspect reactive (? diverticilitis /diverticular disease). Biopsied. Tattooed. - One 3 mm polyp at the recto-sigmoid colon. The examination was otherwise normal.  Overall, have suspected iron deficiency anemia may most likely be related to AVMs in the colon in the setting of anticoagulation, perhaps also due to inflammatory change noted in the left colon, unclear chronicity of that and if related. Biopsies showed no no malignancy / dysplasia. TAs in the left colon.  She had complained of some intermittent left lower quadrant pain after that exam.  Question of some chronic smoldering diverticulitis.  CT scan performed November 2020, no pathology noted in the left colon, no evidence of diverticulitis or mass lesion.   Her hemoglobin did normalize last October with iron.  She had held off on further oral tablets of iron as it made her nauseated.  Unfortunately in May of this year she had decreased energy, hemoglobin dropped to 9.7.  Iron deficiency had recurred as well as B12 deficiency was noted.  Given intolerance of oral iron she was given 2 doses of IV  iron.  CBC repeated after IV iron and B12 supplementation and hemoglobin normalized to 12.7.  She denies any blood in her stools at all.  She denies any abdominal pains.  She is continuing to take her Eliquis and tolerating it okay. Her bowels can be a bit irregular at times with some urgency without warning.  She has not been taking any NSAIDs, using only Tylenol.  Generally feels well.  She is accompanied by her son today.  Energy much better with hemoglobin normalizing.   CT Scan  - 02/05/19 - IMPRESSION: 1. No explanation for patient's left lower quadrant pain status post colonoscopy. 2.  Aortic Atherosclerosis (ICD10-I70.0). 3. Infrarenal abdominal aortic ectasia. Ectatic abdominal aorta at risk for aneurysm development. Recommend followup by ultrasound in 5 years. This recommendation follows ACR consensus guidelines: White Paper of the ACR Incidental Findings Committee II on Vascular Findings. J Am Coll Radiol 2013; 10:789-794. 4. Advanced lumbar spondylosis noted.    Past Medical History:  Diagnosis Date  . Allergic rhinitis, cause unspecified   . Anxiety state, unspecified   . Arthritis   . Colon polyp   . Diaphragmatic hernia without mention of obstruction or gangrene   . Diverticulosis of colon (without mention of hemorrhage)   . Female stress incontinence   . HTN (hypertension)   . Insomnia, unspecified   . Interstitial cystitis    surgery 02/01/2013  . Irritable bowel syndrome   . Memory loss   . Other and unspecified hyperlipidemia   . Other premature beats   . Polyneuropathy in diabetes(357.2)   . Postmenopausal bleeding   .  Proteinuria   . Rectal ulcer   . Skin cancer   . Stroke ([place]) 2018  . 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 Unspecified essential hypertension   . Viral warts, unspecified      Past Surgical History:  Procedure Laterality Date  . ABDOMINAL HYSTERECTOMY   1977   Dr.Hambright  . APPENDECTOMY  1965  . CATARACT EXTRACTION, BILATERAL  02/10/2006   Dr.Groat   . COLONOSCOPY  07/17/2004   polypectomy  . [place]  . LOOP RECORDER INSERTION N/A 07/31/2016   Procedure: Loop Recorder Insertion;  Surgeon: [name], MD;  Location: [facility];  Service: Cardiovascular;  Laterality: N/A;  . TEE WITHOUT CARDIOVERSION N/A 07/30/2016   Procedure: TRANSESOPHAGEAL ECHOCARDIOGRAM (TEE);  Surgeon: [name], MD;  Location: [facility];  Service: Cardiovascular;  Laterality: N/A;  . [place]  . TRIGGER FINGER RELEASE  05/13/16  . urinary bladder      surgery 02/01/2013 to stretch bladder stem   Family History  Problem Relation Age of Onset  . Pancreatic cancer Mother 66  . Heart attack Father   . Bladder Cancer Father   . Hypertension Sister   . Diabetes Sister   . Diabetes Brother   . Kidney cancer Brother   . Hypertension Brother   . Diabetes Brother   . Heart disease Brother 56       Myocardial Infarction    Social History   Tobacco Use  . Smoking status: Former Smoker    Packs/day: 0.25    Years: 62.00    Pack years: 15.50    Quit date: 04/02/2011    Years since quitting: 8.5  . Smokeless tobacco: Never Used  . Tobacco comment: stopped consistently smoking in 2012 but still smokes occasionally (one pack per week)  Vaping Use  . Vaping Use: Never used  Substance Use Topics  . Alcohol use: No  . Drug use: No   Current Outpatient Medications  Medication Sig Dispense Refill  . ACCU-CHEK AVIVA PLUS test strip USE TO TEST BLOOD SUGAR TWICE DAILY. 100 each 11  . amLODipine (NORVASC) 2.5 MG tablet Take 1 tablet (2.5 mg total) by mouth 2 (two) times daily. 180 tablet 1  . atorvastatin (LIPITOR) 40 MG tablet TAKE 1 TABLET DAILY AT 6 P.M. 90 tablet 3  . citalopram (CELEXA) 10 MG tablet TAKE 1 TABLET DAILY 90 tablet 3  . ELIQUIS 5 MG TABS tablet TAKE 1 TABLET TWICE A DAY 180  tablet 1  . gabapentin (NEURONTIN) 300 MG capsule Take 1 capsule (300 mg total) by mouth 3 (three) times daily. 270 capsule 3  . Lancets (ACCU-CHEK MULTICLIX) lancets Use to test blood sugar twice daily. E11.29 100 each 11  . omeprazole (PRILOSEC) 20 MG capsule Take 20 mg by mouth 2 (two) times daily.    . pioglitazone (ACTOS) 15 MG tablet TAKE 1 TABLET DAILY 90 tablet 3  . Vitamin D, Ergocalciferol, (DRISDOL) 1.25 MG (50000 UNIT) CAPS capsule TAKE 1 CAPSULE ONCE WEEKLY FOR VITAMIN D 12 capsule 3   No current facility-administered medications for this visit.   Allergies  Allergen Reactions  . Jardiance [Empagliflozin] Diarrhea  . Metformin And Related Diarrhea  . Ceclor [Cefaclor] Other (See Comments)    Unknown reaction; not recalled  . Codeine Nausea And Vomiting  . Lopid [Gemfibrozil] Other (See Comments)  Unknown reaction; not recalled  . Metoprolol Nausea And Vomiting  . Morphine And Related Other (See Comments)    Passed out  . Niacin Nausea And Vomiting    Unknown reaction Unknown reaction; not recalled  . Niacin And Related Other (See Comments)    Unknown reaction; not recalled  . Relafen [Nabumetone] Nausea And Vomiting  . Septra [Sulfamethoxazole-Trimethoprim] Nausea And Vomiting  . Tetracycline Nausea And Vomiting  . Toprol Xl [Metoprolol Tartrate] Nausea And Vomiting  . Adhesive [Tape] Rash and Other (See Comments)    Tears the skin also     Review of Systems: All systems reviewed and negative except where noted in HPI.   CBC Latest Ref Rng & Units 10/15/2019 08/19/2019 08/09/2019  WBC 4.0 - 10.5 K/uL 6.4 7.5 8.7  Hemoglobin 12.0 - 15.0 g/dL 12.7 9.7(L) 9.8(L)  Hematocrit 36 - 46 % 38.1 29.2(L) 30.8(L)  Platelets 150 - 400 K/uL 156.0 259.0 261    Lab Results  Component Value Date   IRON 29 (L) 08/19/2019   TIBC 456 (H) 11/02/2018   FERRITIN 7.3 (L) 08/19/2019   Lab Results  Component Value Date   CREATININE 1.42 (H) 08/09/2019   BUN 25 08/09/2019   NA  136 08/09/2019   K 5.0 08/09/2019   CL 102 08/09/2019   CO2 24 08/09/2019    Lab Results  Component Value Date   ALT 8 08/09/2019   AST 11 08/09/2019   ALKPHOS 74 02/01/2018   BILITOT 0.4 08/09/2019     Physical Exam: BP 112/72   Pulse 68   Ht 5\' 7"  (1.702 m)   Wt 173 lb 2 oz (78.5 kg)   BMI 27.12 kg/m  Constitutional: Pleasant,well-developed, female in no acute distress. Abdominal: Soft, nondistended, nontender. There are no masses palpable. Extremities: no edema Lymphadenopathy: No cervical adenopathy noted. Neurological: Alert and oriented to person place and time. Skin: Skin is warm and dry. No rashes noted. Psychiatric: Normal mood and affect. Behavior is normal.   ASSESSMENT AND PLAN: 86 year old female here for reassessment of the following:  Iron deficiency anemia / B12 deficiency / colonic AVMs / diverticulosis - EGD and colonoscopy as above, have suspected that her iron deficiency is due to combination of gastritis, large colonic AVMs, perhaps with component of significant inflammatory change in sigmoid colon from diverticulosis / itis in the setting of being on anticoagulation.  Unfortunately she does not tolerate oral iron, had recurrence of anemia again with low iron levels, now status post IV iron infusion and B12 supplementation and her hemoglobin has normalized and she feels well.  She has had brown stools throughout but no overt bleeding.  We discussed options moving forward.  We will keep an eye on her hemoglobin and repeated in a month, if she is having downtrending she may need periodic IV iron.  Recommend she go on a multivitamin with extra iron or lower dose oral iron supplementation over-the-counter to see if she may tolerate that better than traditional ferrous sulfate.  We discussed that I think the left colon findings are representative and benign inflammatory changes from diverticulosis based off of colonoscopy, pathology, and CT imaging.  If she has overt  bleeding or continued pain we could consider a repeat colonoscopy at the hospital to reevaluate left colon and treat colonic AVMS with APC, however she really wants to avoid any invasive procedures at this point if at all possible and given size of colonic AVMs she is at risk for procedural related bleeding.  I do recommend she take a daily fiber supplement to keep her bowels regular.  Will await her course with iron and B12 supplementation and repeat blood counts in 1 month.  She will contact me in the interim with any questions.  [place] Cellar, MD [facility]

## 2019-11-03 ENCOUNTER — Other Ambulatory Visit: Payer: Self-pay

## 2019-11-03 ENCOUNTER — Ambulatory Visit (INDEPENDENT_AMBULATORY_CARE_PROVIDER_SITE_OTHER): Payer: Medicare Other | Admitting: Internal Medicine

## 2019-11-03 ENCOUNTER — Encounter: Payer: Self-pay | Admitting: Internal Medicine

## 2019-11-03 VITALS — BP 142/68 | HR 65 | Temp 98.4°F | Resp 16 | Ht 67.0 in | Wt 174.0 lb

## 2019-11-03 DIAGNOSIS — I639 Cerebral infarction, unspecified: Secondary | ICD-10-CM

## 2019-11-03 DIAGNOSIS — E039 Hypothyroidism, unspecified: Secondary | ICD-10-CM

## 2019-11-03 DIAGNOSIS — Z23 Encounter for immunization: Secondary | ICD-10-CM | POA: Diagnosis not present

## 2019-11-03 DIAGNOSIS — E1122 Type 2 diabetes mellitus with diabetic chronic kidney disease: Secondary | ICD-10-CM

## 2019-11-03 DIAGNOSIS — N1831 Chronic kidney disease, stage 3a: Secondary | ICD-10-CM

## 2019-11-03 DIAGNOSIS — E1121 Type 2 diabetes mellitus with diabetic nephropathy: Secondary | ICD-10-CM

## 2019-11-03 DIAGNOSIS — I10 Essential (primary) hypertension: Secondary | ICD-10-CM | POA: Diagnosis not present

## 2019-11-03 MED ORDER — TETANUS-DIPHTH-ACELL PERTUSSIS 5-2.5-18.5 LF-MCG/0.5 IM SUSP: 0.5000 mL | Freq: Once | INTRAMUSCULAR | 0 refills | Status: AC

## 2019-11-03 MED ORDER — ZOSTER VAC RECOMB ADJUVANTED 50 MCG/0.5ML IM SUSR: 0.5000 mL | Freq: Once | INTRAMUSCULAR | 1 refills | Status: AC

## 2019-11-03 NOTE — Patient Instructions (Signed)
Hypothyroidism  Hypothyroidism is when the thyroid gland does not make enough of certain hormones (it is underactive). The thyroid gland is a small gland located in the lower front part of the neck, just in front of the windpipe (trachea). This gland makes hormones that help control how the body uses food for energy (metabolism) as well as how the heart and brain function. These hormones also play a role in keeping your bones strong. When the thyroid is underactive, it produces too little of the hormones thyroxine (T4) and triiodothyronine (T3). What are the causes? This condition may be caused by:  Hashimoto's disease. This is a disease in which the body's disease-fighting system (immune system) attacks the thyroid gland. This is the most common cause.  Viral infections.  Pregnancy.  Certain medicines.  Birth defects.  Past radiation treatments to the head or neck for cancer.  Past treatment with radioactive iodine.  Past exposure to radiation in the environment.  Past surgical removal of part or all of the thyroid.  Problems with a gland in the center of the brain (pituitary gland).  Lack of enough iodine in the diet. What increases the risk? You are more likely to develop this condition if:  You are female.  You have a family history of thyroid conditions.  You use a medicine called lithium.  You take medicines that affect the immune system (immunosuppressants). What are the signs or symptoms? Symptoms of this condition include:  Feeling as though you have no energy (lethargy).  Not being able to tolerate cold.  Weight gain that is not explained by a change in diet or exercise habits.  Lack of appetite.  Dry skin.  Coarse hair.  Menstrual irregularity.  Slowing of thought processes.  Constipation.  Sadness or depression. How is this diagnosed? This condition may be diagnosed based on:  Your symptoms, your medical history, and a physical exam.  Blood  tests. You may also have imaging tests, such as an ultrasound or MRI. How is this treated? This condition is treated with medicine that replaces the thyroid hormones that your body does not make. After you begin treatment, it may take several weeks for symptoms to go away. Follow these instructions at home:  Take over-the-counter and prescription medicines only as told by your health care provider.  If you start taking any new medicines, tell your health care provider.  Keep all follow-up visits as told by your health care provider. This is important. ? As your condition improves, your dosage of thyroid hormone medicine may change. ? You will need to have blood tests regularly so that your health care provider can monitor your condition. Contact a health care provider if:  Your symptoms do not get better with treatment.  You are taking thyroid replacement medicine and you: ? Sweat a lot. ? Have tremors. ? Feel anxious. ? Lose weight rapidly. ? Cannot tolerate heat. ? Have emotional swings. ? Have diarrhea. ? Feel weak. Get help right away if you have:  Chest pain.  An irregular heartbeat.  A rapid heartbeat.  Difficulty breathing. Summary  Hypothyroidism is when the thyroid gland does not make enough of certain hormones (it is underactive).  When the thyroid is underactive, it produces too little of the hormones thyroxine (T4) and triiodothyronine (T3).  The most common cause is Hashimoto's disease, a disease in which the body's disease-fighting system (immune system) attacks the thyroid gland. The condition can also be caused by viral infections, medicine, pregnancy, or past   radiation treatment to the head or neck.  Symptoms may include weight gain, dry skin, constipation, feeling as though you do not have energy, and not being able to tolerate cold.  This condition is treated with medicine to replace the thyroid hormones that your body does not make. This information  is not intended to replace advice given to you by your health care provider. Make sure you discuss any questions you have with your health care provider. Document Revised: 02/28/2017 Document Reviewed: 02/26/2017 Elsevier Patient Education  2020 [employer]  

## 2019-11-03 NOTE — Progress Notes (Signed)
Subjective:  Patient ID: Penny Miller, female    DOB: Jul 01, 1931  Age: 86 y.o. MRN: [id number]  CC: Hypertension, Diabetes, Atrial Fibrillation, Back Pain, and Hypothyroidism  This visit occurred during the SARS-CoV-2 public health emergency.  Safety protocols were in place, including screening questions prior to the visit, additional usage of staff PPE, and extensive cleaning of exam room while observing appropriate contact time as indicated for disinfecting solutions.    HPI Penny Miller presents for establishing a primary care relationship. She has a complicated medical history and a myriad of symptoms but at this time she feels like she is at her baseline and has no new symptoms or concerns to offer.  History Penny Miller has a past medical history of Allergic rhinitis, cause unspecified, Anxiety state, unspecified, Arthritis, Colon polyp, Diaphragmatic hernia without mention of obstruction or gangrene, Diverticulosis of colon (without mention of hemorrhage), Female stress incontinence, HTN (hypertension), Insomnia, unspecified, Interstitial cystitis, Irritable bowel syndrome, Memory loss, Other and unspecified hyperlipidemia, Other premature beats, Polyneuropathy in diabetes(357.2), Postmenopausal bleeding, Proteinuria, Rectal ulcer, Skin cancer, Stroke ([place]) (2018), Type II or 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   She has a past surgical history that includes Appendectomy (1965); Abdominal hysterectomy (1977); Cystoscopy (1987); Trigger finger release (1990); Cataract extraction, bilateral (02/10/2006); urinary bladder ; Colonoscopy (07/17/2004); Trigger finger release (05/13/16); TEE without cardioversion (N/A, 07/30/2016); and LOOP RECORDER INSERTION (N/A, 07/31/2016).   Her family history includes Bladder Cancer in her father; COPD in her sister; Cancer in her  father; Diabetes in her brother, brother, and sister; Heart attack in her brother and father; Heart disease in her brother; Heart disease (age of onset: 22) in her brother; Hypertension in her brother, sister, and son; Pancreatic cancer (age of onset: 25) in her mother.She reports that she quit smoking about 8 years ago. She has a 15.50 pack-year smoking history. She has never used smokeless tobacco. She reports that she does not drink alcohol and does not use drugs.  Outpatient Medications Prior to Visit  Medication Sig Dispense Refill   ACCU-CHEK AVIVA PLUS test strip USE TO TEST BLOOD SUGAR TWICE DAILY. 100 each 11   amLODipine (NORVASC) 2.5 MG tablet Take 1 tablet (2.5 mg total) by mouth 2 (two) times daily. 180 tablet 1   atorvastatin (LIPITOR) 40 MG tablet TAKE 1 TABLET DAILY AT 6 P.M. 90 tablet 3   citalopram (CELEXA) 10 MG tablet TAKE 1 TABLET DAILY 90 tablet 3   ELIQUIS 5 MG TABS tablet TAKE 1 TABLET TWICE A DAY 180 tablet 1   gabapentin (NEURONTIN) 300 MG capsule Take 1 capsule (300 mg total) by mouth 3 (three) times daily. 270 capsule 3   Lancets (ACCU-CHEK MULTICLIX) lancets Use to test blood sugar twice daily. E11.29 100 each 11   omeprazole (PRILOSEC) 20 MG capsule Take 20 mg by mouth 2 (two) times daily.     pioglitazone (ACTOS) 15 MG tablet TAKE 1 TABLET DAILY 90 tablet 3   vitamin B-12 (CYANOCOBALAMIN) 1000 MCG tablet Take 1,000 mcg by mouth daily.     Vitamin D, Ergocalciferol, (DRISDOL) 1.25 MG (50000 UNIT) CAPS capsule TAKE 1 CAPSULE ONCE WEEKLY FOR VITAMIN D 12 capsule 3   No facility-administered medications prior to visit.    ROS Review of Systems  Constitutional: Positive for fatigue. Negative for appetite change, diaphoresis and unexpected weight change.  HENT: Negative.   Eyes: Negative for visual disturbance.  Respiratory: Negative for cough, chest tightness, shortness of breath and wheezing.   Cardiovascular: Negative for chest pain, palpitations and  leg swelling.  Gastrointestinal: Negative for abdominal pain, constipation, diarrhea, nausea and vomiting.  Endocrine: Negative.   Genitourinary: Negative.  Negative for difficulty urinating.  Musculoskeletal: Positive for arthralgias and back pain. Negative for myalgias and neck pain.  Skin: Negative.  Negative for color change and pallor.  Neurological: Negative.  Negative for dizziness, weakness, light-headedness and headaches.  Hematological: Negative for adenopathy. Does not bruise/bleed easily.  Psychiatric/Behavioral: Negative.     Objective:  BP (!) 142/68 (BP Location: Left Arm, Patient Position: Sitting, Cuff Size: Normal)    Pulse 65    Temp 98.4 F (36.9 C) (Oral)    Resp 16    Ht '5\' 7"'  (1.702 m)    Wt 174 lb (78.9 kg)    SpO2 97%    BMI 27.25 kg/m   Physical Exam HENT:     Nose: Nose normal.     Mouth/Throat:     Mouth: Mucous membranes are moist.  Eyes:     General: No scleral icterus.    Conjunctiva/sclera: Conjunctivae normal.  Cardiovascular:     Rate and Rhythm: Normal rate. Rhythm irregularly irregular.     Pulses: Normal pulses.     Heart sounds: No murmur heard.   Pulmonary:     Effort: Pulmonary effort is normal.     Breath sounds: No stridor. No wheezing, rhonchi or rales.  Abdominal:     General: Abdomen is flat.     Palpations: There is no mass.     Tenderness: There is no abdominal tenderness. There is no guarding.  Musculoskeletal:        General: Normal range of motion.     Cervical back: Neck supple.     Right lower leg: No edema.     Left lower leg: No edema.  Lymphadenopathy:     Cervical: No cervical adenopathy.  Skin:    General: Skin is warm and dry.  Neurological:     General: No focal deficit present.     Mental Status: She is alert.  Psychiatric:        Mood and Affect: Mood normal.        Behavior: Behavior normal.     Lab Results  Component Value Date   WBC 6.4 10/15/2019   HGB 12.7 10/15/2019   HCT 38.1 10/15/2019    PLT 156.0 10/15/2019   GLUCOSE 125 (H) 08/09/2019   CHOL 160 08/09/2019   TRIG 161 (H) 08/09/2019   HDL 51 08/09/2019   LDLCALC 83 08/09/2019   ALT 8 08/09/2019   AST 11 08/09/2019   NA 136 08/09/2019   K 5.0 08/09/2019   CL 102 08/09/2019   CREATININE 1.42 (H) 08/09/2019   BUN 25 08/09/2019   CO2 24 08/09/2019   TSH 5.36 (H) 08/09/2019   INR 1.06 07/08/2016   HGBA1C 6.5 (H) 08/09/2019   MICROALBUR 9.8 08/09/2019    Assessment & Plan:   Penny Miller was seen today for hypertension, diabetes, atrial fibrillation, back pain and hypothyroidism.  Diagnoses and all orders for this visit:  Essential hypertension- Her blood pressure is adequately well controlled.  Need for Tdap vaccination -     Tdap ([place]) 5-2.5-18.5 LF-MCG/0.5 injection; Inject 0.5 mLs into the muscle once for 1 dose.  Need for shingles vaccine -     Zoster Vaccine Adjuvanted [facility]) injection; Inject 0.5 mLs into the muscle once  for 1 dose.  Need for pneumococcal vaccination -     Pneumococcal polysaccharide vaccine 23-valent greater than or equal to 2yo subcutaneous/IM  Acquired hypothyroidism- Her recent TSH was in the normal range. Will continue the current dose of levothyroxine. I recommend we recheck her TSH in about 3 months.  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 Her blood sugars have been well controlled. I recommend we recheck the A1c in about 3 months. -     HM Diabetes Foot Exam   I am having Penny Miller start on Zoster Vaccine Adjuvanted and Tdap. I am also having her maintain her accu-chek multiclix, Accu-Chek Aviva Plus, gabapentin, Vitamin D (Ergocalciferol), omeprazole, citalopram, pioglitazone, Eliquis, amLODipine, atorvastatin, and vitamin B-12.  Meds ordered this encounter  Medications   Zoster Vaccine Adjuvanted [facility]) injection    Sig: Inject 0.5 mLs into the muscle once for 1  dose.    Dispense:  0.5 mL    Refill:  1   Tdap (BOOSTRIX) 5-2.5-18.5 LF-MCG/0.5 injection    Sig: Inject 0.5 mLs into the muscle once for 1 dose.    Dispense:  0.5 mL    Refill:  0     Follow-up: Return in about 3 months (around 02/03/2020).  Scarlette Calico, MD

## 2019-11-04 ENCOUNTER — Telehealth: Payer: Self-pay | Admitting: *Deleted

## 2019-11-04 ENCOUNTER — Encounter: Payer: Self-pay | Admitting: Internal Medicine

## 2019-11-04 LAB — CUP PACEART REMOTE DEVICE CHECK
Date Time Interrogation Session: 20210803231556
Implantable Pulse Generator Implant Date: 20180502

## 2019-11-04 NOTE — Telephone Encounter (Signed)
Spoke with pt to request manual LINQ transmission for review as it appear monitor had been disconnected for a period in June. Pt in agreement, but is having trouble with her monitor. Attempted to call [facility] for troubleshooting assistance, but unable to get through due to wait times. Will try again later, pt in agreement with plan.

## 2019-11-05 NOTE — Telephone Encounter (Signed)
Spoke with patient and [employer] tech support. Unable to send manual transmission at this time as automatic nightly transmission is still sending. Per tech support, try again next week in the morning. Pt in agreement with plan.

## 2019-11-08 ENCOUNTER — Ambulatory Visit (INDEPENDENT_AMBULATORY_CARE_PROVIDER_SITE_OTHER): Payer: Medicare Other | Admitting: *Deleted

## 2019-11-08 ENCOUNTER — Telehealth: Payer: Self-pay | Admitting: Internal Medicine

## 2019-11-08 ENCOUNTER — Telehealth: Payer: Self-pay

## 2019-11-08 DIAGNOSIS — I63442 Cerebral infarction due to embolism of left cerebellar artery: Secondary | ICD-10-CM

## 2019-11-08 NOTE — Telephone Encounter (Signed)
New message    The patient is asking for a call back from the [facility] regarding 3 medication [facility] .   The patient is not aware with two of the medication     omeprazole (PRILOSEC) 20 MG capsule  Netrofurantoin ? Dosage  Urosemide ? Dosage

## 2019-11-08 NOTE — Telephone Encounter (Signed)
New message:   1.Medication Requested: Furosemide 40 mg 2. Pharmacy (Name, [facility]): [place] [place] 3. On Med List: No  4. Last Visit with PCP: 11/03/19  5. Next visit date with PCP: 02/03/20   Agent: Please be advised that RX refills may take up to 3 business days. We ask that you follow-up with your pharmacy.

## 2019-11-08 NOTE — Progress Notes (Signed)
Carelink Summary Report / Loop Recorder 

## 2019-11-09 ENCOUNTER — Encounter: Payer: Self-pay | Admitting: Internal Medicine

## 2019-11-09 MED ORDER — OMEPRAZOLE 20 MG PO CPDR: 20.0000 mg | DELAYED_RELEASE_CAPSULE | Freq: Two times a day (BID) | ORAL | 1 refills | Status: DC

## 2019-11-09 NOTE — Telephone Encounter (Addendum)
Attempted to send remote transmission but had 3248 error code . Instructed to replace receiver and will call back in the morning to send transmission.

## 2019-11-09 NOTE — Telephone Encounter (Signed)
Erx for omeprazole has been sent to mail order.   Informed pt that the nitrofuraton is an abx and is not refilled without an office visit.   Informed pt that furosemide was dc'ed by Dr. Mariea Clonts in May. Pt will need to let us if she begins to swell and or her BP increased. She will need to follow up sooner than November.   Pt stated understanding and will let us know.

## 2019-11-09 NOTE — Telephone Encounter (Signed)
Responded to in another encounter.

## 2019-11-10 NOTE — Telephone Encounter (Signed)
LMOVM for pt to return my call.  

## 2019-11-11 ENCOUNTER — Telehealth: Payer: Self-pay | Admitting: Internal Medicine

## 2019-11-11 NOTE — Telephone Encounter (Signed)
Error: note no longer needed

## 2019-11-11 NOTE — Telephone Encounter (Signed)
Pt transmission received. The nurse Raquel Sarna states everything looks good and nothing needed to be done at this time. I relayed the message to the pt. She verbalized understanding.

## 2019-12-01 ENCOUNTER — Telehealth: Payer: Self-pay

## 2019-12-01 NOTE — Telephone Encounter (Signed)
Called and LM for pt to go to the lab for CBC

## 2019-12-01 NOTE — Telephone Encounter (Signed)
-----   Message from Roetta Sessions, [place] sent at 11/02/2019  2:43 PM EDT ----- Regarding: remind pt to go to the lab for CBC Remind pt to go to the lab for CBC around Sept 1

## 2019-12-10 LAB — CUP PACEART REMOTE DEVICE CHECK
Date Time Interrogation Session: 20210905233549
Implantable Pulse Generator Implant Date: 20180502

## 2019-12-13 ENCOUNTER — Ambulatory Visit (INDEPENDENT_AMBULATORY_CARE_PROVIDER_SITE_OTHER): Payer: Medicare Other | Admitting: *Deleted

## 2019-12-13 DIAGNOSIS — I63442 Cerebral infarction due to embolism of left cerebellar artery: Secondary | ICD-10-CM

## 2019-12-14 ENCOUNTER — Other Ambulatory Visit (INDEPENDENT_AMBULATORY_CARE_PROVIDER_SITE_OTHER): Payer: Medicare Other

## 2019-12-14 DIAGNOSIS — D509 Iron deficiency anemia, unspecified: Secondary | ICD-10-CM | POA: Diagnosis not present

## 2019-12-14 DIAGNOSIS — K5730 Diverticulosis of large intestine without perforation or abscess without bleeding: Secondary | ICD-10-CM | POA: Diagnosis not present

## 2019-12-14 DIAGNOSIS — Z7901 Long term (current) use of anticoagulants: Secondary | ICD-10-CM | POA: Diagnosis not present

## 2019-12-14 DIAGNOSIS — E538 Deficiency of other specified B group vitamins: Secondary | ICD-10-CM | POA: Diagnosis not present

## 2019-12-14 LAB — CBC WITH DIFFERENTIAL/PLATELET
Basophils Absolute: 0 10*3/uL (ref 0.0–0.1)
Basophils Relative: 0.6 % (ref 0.0–3.0)
Eosinophils Absolute: 0.2 10*3/uL (ref 0.0–0.7)
Eosinophils Relative: 2.4 % (ref 0.0–5.0)
HCT: 37.5 % (ref 36.0–46.0)
Hemoglobin: 12.8 g/dL (ref 12.0–15.0)
Lymphocytes Relative: 13.7 % (ref 12.0–46.0)
Lymphs Abs: 1.1 10*3/uL (ref 0.7–4.0)
MCHC: 34.2 g/dL (ref 30.0–36.0)
MCV: 95.6 fl (ref 78.0–100.0)
Monocytes Absolute: 0.9 10*3/uL (ref 0.1–1.0)
Monocytes Relative: 10.8 % (ref 3.0–12.0)
Neutro Abs: 5.8 10*3/uL (ref 1.4–7.7)
Neutrophils Relative %: 72.5 % (ref 43.0–77.0)
Platelets: 150 10*3/uL (ref 150.0–400.0)
RBC: 3.92 Mil/uL (ref 3.87–5.11)
RDW: 13.9 % (ref 11.5–15.5)
WBC: 8 10*3/uL (ref 4.0–10.5)

## 2019-12-15 NOTE — Progress Notes (Signed)
Carelink Summary Report / Loop Recorder 

## 2019-12-16 ENCOUNTER — Telehealth: Payer: Self-pay | Admitting: Gastroenterology

## 2019-12-16 ENCOUNTER — Other Ambulatory Visit: Payer: Self-pay

## 2019-12-16 DIAGNOSIS — D509 Iron deficiency anemia, unspecified: Secondary | ICD-10-CM

## 2019-12-16 NOTE — Progress Notes (Signed)
Order for cbc in Nov 2021

## 2019-12-16 NOTE — Telephone Encounter (Signed)
Pt would like to inform Tia Alert she did receive her lab message

## 2019-12-20 ENCOUNTER — Ambulatory Visit: Payer: Medicare Other | Admitting: Internal Medicine

## 2020-01-07 ENCOUNTER — Other Ambulatory Visit: Payer: Self-pay

## 2020-01-07 ENCOUNTER — Ambulatory Visit (INDEPENDENT_AMBULATORY_CARE_PROVIDER_SITE_OTHER): Payer: Medicare Other | Admitting: Podiatry

## 2020-01-07 ENCOUNTER — Encounter: Payer: Self-pay | Admitting: Podiatry

## 2020-01-07 DIAGNOSIS — M79674 Pain in right toe(s): Secondary | ICD-10-CM

## 2020-01-07 DIAGNOSIS — M79675 Pain in left toe(s): Secondary | ICD-10-CM

## 2020-01-07 DIAGNOSIS — E1142 Type 2 diabetes mellitus with diabetic polyneuropathy: Secondary | ICD-10-CM | POA: Diagnosis not present

## 2020-01-07 DIAGNOSIS — B351 Tinea unguium: Secondary | ICD-10-CM

## 2020-01-07 DIAGNOSIS — L84 Corns and callosities: Secondary | ICD-10-CM

## 2020-01-08 LAB — CUP PACEART REMOTE DEVICE CHECK
Date Time Interrogation Session: 20211008233757
Implantable Pulse Generator Implant Date: 20180502

## 2020-01-09 NOTE — Progress Notes (Signed)
Subjective:  Patient ID: Penny Miller, female    DOB: [birth year],  MRN: [id number]  86 y.o. female presents with preventative diabetic foot care and painful thick toenails that are difficult to trim. Pain interferes with ambulation. Aggravating factors include wearing enclosed shoe gear. Pain is relieved with periodic professional debridement.   She has h/o neuropathy and takes gabapentin for her symptoms. She voices no new pedal problems on today's visit.  Review of Systems: Negative except as noted in the HPI.  Past Medical History:  Diagnosis Date  . Allergic rhinitis, cause unspecified   . Anxiety state, unspecified   . Arthritis   . Colon polyp   . Diaphragmatic hernia without mention of obstruction or gangrene   . Diverticulosis of colon (without mention of hemorrhage)   . Female stress incontinence   . HTN (hypertension)   . Insomnia, unspecified   . Interstitial cystitis    surgery 02/01/2013  . Irritable bowel syndrome   . Memory loss   . Other and unspecified hyperlipidemia   . Other premature beats   . Polyneuropathy in diabetes(357.2)   . Postmenopausal bleeding   . Proteinuria   . Rectal ulcer   . Skin cancer   . Stroke ([place]) 2018  . 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 Unspecified essential hypertension   . Viral warts, unspecified    Past Surgical History:  Procedure Laterality Date  . ABDOMINAL HYSTERECTOMY  1977   Dr.Hambright  . APPENDECTOMY  1965  . CATARACT EXTRACTION, BILATERAL  02/10/2006   Dr.Groat   . COLONOSCOPY  07/17/2004   polypectomy  . [place]  . LOOP RECORDER INSERTION N/A 07/31/2016   Procedure: Loop Recorder Insertion;  Surgeon: [name], MD;  Location: [facility];  Service: Cardiovascular;  Laterality: N/A;  . TEE WITHOUT CARDIOVERSION N/A 07/30/2016   Procedure: TRANSESOPHAGEAL ECHOCARDIOGRAM (TEE);  Surgeon: [name], MD;  Location: [facility];  Service: Cardiovascular;  Laterality: N/A;  . [place]  . TRIGGER FINGER RELEASE  05/13/16  . urinary bladder      surgery 02/01/2013 to stretch bladder stem   Patient Active Problem List   Diagnosis Date Noted  . Incomplete emptying of bladder 10/01/2019  . Postherpetic neuralgia 08/15/2019  . Acquired hypothyroidism 08/15/2019  . Iron deficiency anemia due to chronic blood loss 08/15/2019  . Lumbar radiculopathy 05/26/2018  . Spinal stenosis of lumbar region with neurogenic claudication 07/21/2017  . Vertigo of central origin 07/21/2017  . Benign hypertension 07/21/2017  . Overweight (BMI 25.0-29.9) 11/07/2016  . Paroxysmal atrial fibrillation ([place]) 09/03/2016  . Diabetic polyneuropathy associated with type 2 diabetes mellitus ([place]) 09/03/2016  . Aneurysm of vertebral artery ([place]) 08/12/2016  . Embolic stroke involving left cerebellar artery ([place]) 07/29/2016  . Chronic kidney disease (CKD), active medical management without dialysis, stage 3 (moderate) ([place]) 05/08/2016  . DM (diabetes mellitus), type 2 with renal complications ([place]) 50/11/3816  . Sinusitis, chronic 06/22/2014  . Varicose vein of leg 12/21/2013  . Deaf 06/15/2013  . Bladder infection, chronic 01/04/2013  . Polyneuropathy in diabetes(357.2)   . Insomnia, unspecified   . Essential hypertension   . Hyperlipidemia   . Chronic interstitial cystitis     Current Outpatient Medications:  .  ACCU-CHEK AVIVA PLUS test strip, USE TO TEST BLOOD SUGAR TWICE DAILY., Disp: 100 each, Rfl: 11 .  amLODipine (NORVASC) 2.5 MG tablet, Take 1 tablet (2.5 mg total) by mouth 2 (two) times daily., Disp: 180 tablet, Rfl: 1 .  atorvastatin (LIPITOR) 40 MG tablet, TAKE 1 TABLET DAILY AT 6 P.M., Disp: 90 tablet, Rfl: 3 .  citalopram (CELEXA) 10 MG tablet, TAKE 1 TABLET DAILY, Disp: 90 tablet, Rfl: 3 .  ELIQUIS 5 MG TABS tablet, TAKE 1 TABLET TWICE A DAY, Disp: 180  tablet, Rfl: 1 .  furosemide (LASIX) 40 MG tablet, Take 40 mg by mouth daily., Disp: , Rfl:  .  gabapentin (NEURONTIN) 300 MG capsule, Take 1 capsule (300 mg total) by mouth 3 (three) times daily., Disp: 270 capsule, Rfl: 3 .  Lancets (ACCU-CHEK MULTICLIX) lancets, Use to test blood sugar twice daily. E11.29, Disp: 100 each, Rfl: 11 .  nitrofurantoin (MACRODANTIN) 100 MG capsule, Take 100 mg by mouth at bedtime., Disp: , Rfl:  .  omeprazole (PRILOSEC) 20 MG capsule, Take 1 capsule (20 mg total) by mouth 2 (two) times daily., Disp: 90 capsule, Rfl: 1 .  pioglitazone (ACTOS) 15 MG tablet, TAKE 1 TABLET DAILY, Disp: 90 tablet, Rfl: 3 .  vitamin B-12 (CYANOCOBALAMIN) 1000 MCG tablet, Take 1,000 mcg by mouth daily., Disp: , Rfl:  .  Vitamin D, Ergocalciferol, (DRISDOL) 1.25 MG (50000 UNIT) CAPS capsule, TAKE 1 CAPSULE ONCE WEEKLY FOR VITAMIN D, Disp: 12 capsule, Rfl: 3 Allergies  Allergen Reactions  . Jardiance [Empagliflozin] Diarrhea  . Metformin And Related Diarrhea  . Ceclor [Cefaclor] Other (See Comments)    Unknown reaction; not recalled  . Codeine Nausea And Vomiting  . Lopid [Gemfibrozil] Other (See Comments)    Unknown reaction; not recalled  . Metoprolol Nausea And Vomiting  . Morphine And Related Other (See Comments)    Passed out  . Niacin Nausea And Vomiting    Unknown reaction Unknown reaction; not recalled  . Niacin And Related Other (See Comments)    Unknown reaction; not recalled  . Relafen [Nabumetone] Nausea And Vomiting  . Septra [Sulfamethoxazole-Trimethoprim] Nausea And Vomiting  . Tetracycline Nausea And Vomiting  . Toprol Xl [Metoprolol Tartrate] Nausea And Vomiting  . Adhesive [Tape] Rash and Other (See Comments)    Tears the skin also   Social History   Tobacco Use  Smoking Status Former Smoker  . Packs/day: 0.25  . Years: 62.00  . Pack years: 15.50  . Quit date: 04/02/2011  . Years since quitting: 8.7  Smokeless Tobacco Never Used  Tobacco Comment    stopped consistently smoking in 2012 but still smokes occasionally (one pack per week)    Objective:   Constitutional Pt is a pleasant 86 y.o. Caucasian female, in NAD.Marland Kitchen  Vascular Neurovascular status unchanged b/l lower extremities. Capillary refill time to digits <4 seconds b/l lower extremities. Palpable pedal pulses b/l LE. Pedal hair absent. Lower extremity skin temperature gradient within normal limits. No pain with calf compression b/l. No cyanosis or clubbing noted.  Neurologic Normal speech. Oriented to person, place, and time. Pt has subjective symptoms of neuropathy. Protective sensation intact 5/5 intact bilaterally with 10g monofilament b/l. Vibratory sensation intact b/l.  Dermatologic Pedal skin is thin shiny, atrophic b/l lower extremities. No open wounds bilaterally. No interdigital macerations bilaterally. Toenails 1-5 b/l elongated, discolored, dystrophic, thickened, crumbly with subungual debris and tenderness to dorsal palpation. Hyperkeratotic lesion(s) L hallux and R hallux.  No erythema, no edema, no drainage, no flocculence.  Orthopedic: Normal muscle strength 5/5 to all lower extremity muscle groups bilaterally. No pain crepitus  or joint limitation noted with ROM b/l. No gross bony deformities bilaterally.   Radiographs: None Assessment:   1. Pain due to onychomycosis of toenails of both feet   2. Callus   3. Diabetic peripheral neuropathy ([place])    Plan:  Patient was evaluated and treated and all questions answered.  Onychomycosis with pain -Nails palliatively debridement as below. -Educated on self-care  Procedure: Nail Debridement Rationale: Pain Type of Debridement: manual, sharp debridement. Instrumentation: Nail nipper, rotary burr. Number of Nails: 10  -Examined patient. -Continue diabetic foot care principles. -Toenails 1-5 b/l were debrided in length and girth with sterile nail nippers and dremel without iatrogenic bleeding.  -Callus(es) L hallux  and R hallux pared utilizing sterile scalpel blade without complication or incident. Total number debrided =2. -Patient to continue soft, supportive shoe gear daily. -Patient/POA to call should there be question/concern in the interim.  Return in about 3 months (around 04/08/2020).  Marzetta Board, DPM

## 2020-01-17 ENCOUNTER — Ambulatory Visit (INDEPENDENT_AMBULATORY_CARE_PROVIDER_SITE_OTHER): Payer: Medicare Other

## 2020-01-17 DIAGNOSIS — I639 Cerebral infarction, unspecified: Secondary | ICD-10-CM

## 2020-01-19 NOTE — Progress Notes (Signed)
Carelink Summary Report / Loop Recorder 

## 2020-02-03 ENCOUNTER — Encounter: Payer: Self-pay | Admitting: Internal Medicine

## 2020-02-03 ENCOUNTER — Other Ambulatory Visit: Payer: Self-pay

## 2020-02-03 ENCOUNTER — Ambulatory Visit (INDEPENDENT_AMBULATORY_CARE_PROVIDER_SITE_OTHER): Payer: Medicare Other | Admitting: Internal Medicine

## 2020-02-03 VITALS — BP 138/68 | HR 65 | Temp 98.1°F | Ht 67.0 in | Wt 173.0 lb

## 2020-02-03 DIAGNOSIS — I639 Cerebral infarction, unspecified: Secondary | ICD-10-CM

## 2020-02-03 DIAGNOSIS — E1122 Type 2 diabetes mellitus with diabetic chronic kidney disease: Secondary | ICD-10-CM | POA: Diagnosis not present

## 2020-02-03 DIAGNOSIS — J301 Allergic rhinitis due to pollen: Secondary | ICD-10-CM | POA: Diagnosis not present

## 2020-02-03 DIAGNOSIS — I10 Essential (primary) hypertension: Secondary | ICD-10-CM

## 2020-02-03 DIAGNOSIS — N1831 Chronic kidney disease, stage 3a: Secondary | ICD-10-CM

## 2020-02-03 DIAGNOSIS — N183 Chronic kidney disease, stage 3 unspecified: Secondary | ICD-10-CM

## 2020-02-03 DIAGNOSIS — Z23 Encounter for immunization: Secondary | ICD-10-CM | POA: Diagnosis not present

## 2020-02-03 DIAGNOSIS — L2084 Intrinsic (allergic) eczema: Secondary | ICD-10-CM | POA: Insufficient documentation

## 2020-02-03 DIAGNOSIS — E039 Hypothyroidism, unspecified: Secondary | ICD-10-CM

## 2020-02-03 LAB — BASIC METABOLIC PANEL
BUN: 23 mg/dL (ref 6–23)
CO2: 29 mEq/L (ref 19–32)
Calcium: 9.4 mg/dL (ref 8.4–10.5)
Chloride: 106 mEq/L (ref 96–112)
Creatinine, Ser: 1.44 mg/dL — ABNORMAL HIGH (ref 0.40–1.20)
GFR: 32.97 mL/min — ABNORMAL LOW (ref >60.00)
Glucose, Bld: 114 mg/dL — ABNORMAL HIGH (ref 70–99)
Potassium: 4.6 mEq/L (ref 3.5–5.1)
Sodium: 140 mEq/L (ref 135–145)

## 2020-02-03 LAB — TSH: TSH: 3.51 u[IU]/mL (ref 0.35–4.50)

## 2020-02-03 LAB — HEMOGLOBIN A1C: Hgb A1c MFr Bld: 6.3 % (ref 4.6–6.5)

## 2020-02-03 MED ORDER — TRIAMCINOLONE ACETONIDE 0.5 % EX CREA: 1.0000 "application " | TOPICAL_CREAM | Freq: Three times a day (TID) | CUTANEOUS | 2 refills | Status: DC

## 2020-02-03 MED ORDER — LEVOCETIRIZINE DIHYDROCHLORIDE 5 MG PO TABS: 5.0000 mg | ORAL_TABLET | Freq: Every evening | ORAL | 1 refills | Status: DC

## 2020-02-03 NOTE — Patient Instructions (Signed)

## 2020-02-03 NOTE — Progress Notes (Signed)
Subjective:  Patient ID: CHANTELE CORADO, female    DOB: [birth year]  Age: 86 y.o. MRN: [id number]  CC: Hypothyroidism and Diabetes  This visit occurred during the SARS-CoV-2 public health emergency.  Safety protocols were in place, including screening questions prior to the visit, additional usage of staff PPE, and extensive cleaning of exam room while observing appropriate contact time as indicated for disinfecting solutions.    HPI MICHIKO LINEMAN presents for f/up -   1.  She complains of chronic runny nose, congestion, postnasal drip, and sneezing.  She is not using anything to treat this.  2.  She complains of a several week history of itchy rash just above her left shoulder blade.  3.  She denies fatigue, constipation, weight changes, or sleep disturbance.  4.  Her blood sugars have been well controlled.  She denies polys.  Outpatient Medications Prior to Visit  Medication Sig Dispense Refill  . ACCU-CHEK AVIVA PLUS test strip USE TO TEST BLOOD SUGAR TWICE DAILY. 100 each 11  . amLODipine (NORVASC) 2.5 MG tablet Take 1 tablet (2.5 mg total) by mouth 2 (two) times daily. 180 tablet 1  . atorvastatin (LIPITOR) 40 MG tablet TAKE 1 TABLET DAILY AT 6 P.M. 90 tablet 3  . citalopram (CELEXA) 10 MG tablet TAKE 1 TABLET DAILY 90 tablet 3  . ELIQUIS 5 MG TABS tablet TAKE 1 TABLET TWICE A DAY 180 tablet 1  . gabapentin (NEURONTIN) 300 MG capsule Take 1 capsule (300 mg total) by mouth 3 (three) times daily. 270 capsule 3  . Lancets (ACCU-CHEK MULTICLIX) lancets Use to test blood sugar twice daily. E11.29 100 each 11  . nitrofurantoin (MACRODANTIN) 100 MG capsule Take 100 mg by mouth at bedtime.    Marland Kitchen omeprazole (PRILOSEC) 20 MG capsule Take 1 capsule (20 mg total) by mouth 2 (two) times daily. 90 capsule 1  . pioglitazone (ACTOS) 15 MG tablet TAKE 1 TABLET DAILY 90 tablet 3  . vitamin B-12 (CYANOCOBALAMIN) 1000 MCG tablet Take 1,000 mcg by mouth daily.    . Vitamin D, Ergocalciferol,  (DRISDOL) 1.25 MG (50000 UNIT) CAPS capsule TAKE 1 CAPSULE ONCE WEEKLY FOR VITAMIN D 12 capsule 3  . furosemide (LASIX) 40 MG tablet Take 40 mg by mouth daily. (Patient not taking: Reported on 02/03/2020)     No facility-administered medications prior to visit.    ROS Review of Systems  Constitutional: Negative for diaphoresis, fatigue and unexpected weight change.  HENT: Positive for congestion, postnasal drip and rhinorrhea. Negative for facial swelling, sore throat and voice change.   Eyes: Negative.   Respiratory: Negative for cough, chest tightness, shortness of breath and wheezing.   Cardiovascular: Negative for chest pain, palpitations and leg swelling.  Gastrointestinal: Negative for abdominal pain, constipation, diarrhea, nausea and vomiting.  Endocrine: Negative.  Negative for cold intolerance and heat intolerance.  Genitourinary: Negative.  Negative for difficulty urinating.  Musculoskeletal: Positive for back pain. Negative for myalgias.       Chronic unchanged LBP  Skin: Positive for rash. Negative for color change.  Neurological: Negative.  Negative for dizziness, weakness, light-headedness, numbness and headaches.  Hematological: Negative for adenopathy. Does not bruise/bleed easily.  Psychiatric/Behavioral: Negative.     Objective:  BP 138/68 (BP Location: Left Arm, Patient Position: Sitting, Cuff Size: Large)   Pulse 65   Temp 98.1 F (36.7 C) (Oral)   Ht 5\' 7"  (1.702 m)   Wt 173 lb (78.5 kg)   SpO2 94%  BMI 27.10 kg/m   BP Readings from Last 3 Encounters:  02/03/20 138/68  11/03/19 (!) 142/68  11/02/19 112/72    Wt Readings from Last 3 Encounters:  02/03/20 173 lb (78.5 kg)  11/03/19 174 lb (78.9 kg)  11/02/19 173 lb 2 oz (78.5 kg)    Physical Exam Vitals reviewed.  HENT:     Nose: Nose normal.     Mouth/Throat:     Mouth: Mucous membranes are moist.  Eyes:     General: No scleral icterus.    Conjunctiva/sclera: Conjunctivae normal.   Cardiovascular:     Rate and Rhythm: Normal rate and regular rhythm.     Heart sounds: No murmur heard.   Pulmonary:     Effort: Pulmonary effort is normal. No tachypnea or respiratory distress.     Breath sounds: No stridor. Examination of the left-upper field reveals rhonchi. Examination of the right-middle field reveals rhonchi. Examination of the left-middle field reveals rhonchi. Examination of the right-lower field reveals rhonchi. Examination of the left-lower field reveals rhonchi. Rhonchi present. No wheezing or rales.  Musculoskeletal:        General: Normal range of motion.     Cervical back: Neck supple.     Right lower leg: No edema.     Left lower leg: No edema.  Lymphadenopathy:     Cervical: No cervical adenopathy.  Skin:    General: Skin is warm and dry.     Coloration: Skin is not pale.     Findings: Rash present. No abscess, lesion or petechiae. Rash is papular. Rash is not macular, pustular, scaling, urticarial or vesicular.     Comments: Above the left shoulder blade there is xerosis and a few excoriations.  Neurological:     General: No focal deficit present.     Mental Status: She is alert.  Psychiatric:        Mood and Affect: Mood normal.        Behavior: Behavior normal.     Lab Results  Component Value Date   WBC 8.0 12/14/2019   HGB 12.8 12/14/2019   HCT 37.5 12/14/2019   PLT 150.0 12/14/2019   GLUCOSE 114 (H) 02/03/2020   CHOL 160 08/09/2019   TRIG 161 (H) 08/09/2019   HDL 51 08/09/2019   LDLCALC 83 08/09/2019   ALT 8 08/09/2019   AST 11 08/09/2019   NA 140 02/03/2020   K 4.6 02/03/2020   CL 106 02/03/2020   CREATININE 1.44 (H) 02/03/2020   BUN 23 02/03/2020   CO2 29 02/03/2020   TSH 3.51 02/03/2020   INR 1.06 07/08/2016   HGBA1C 6.3 02/03/2020   MICROALBUR 9.8 08/09/2019    No results found.  Assessment & Plan:   Hokulani was seen today for hypothyroidism and diabetes.  Diagnoses and all orders for this visit:  Essential  hypertension- Her BP is well controlled -     Basic metabolic panel; Future -     Basic metabolic panel  Acquired hypothyroidism- Her TSH is in the normal range. TRT is not needed.  -     TSH; Future -     TSH  Type 2 diabetes mellitus with stage 3a chronic kidney disease, without long-term current use of insulin (HCC) - Her blood sugar is adequately well controlled.  She will stay on the current dose of pioglitazone. -     Basic metabolic panel; Future -     Hemoglobin A1c; Future -     Hemoglobin  A1c -     Basic metabolic panel  Flu vaccine need -     Flu Vaccine QUAD High Dose(Fluad)  Seasonal allergic rhinitis due to pollen -     levocetirizine (XYZAL) 5 MG tablet; Take 1 tablet (5 mg total) by mouth every evening.  Intrinsic eczema -     levocetirizine (XYZAL) 5 MG tablet; Take 1 tablet (5 mg total) by mouth every evening. -     triamcinolone cream (KENALOG) 0.5 %; Apply 1 application topically 3 (three) times daily.  Chronic kidney disease (CKD), active medical management without dialysis, stage 3 (moderate) ([place])- She will avoid nephrotoxic agents.  Her blood sugar and blood pressure are adequately well controlled.   I have discontinued Jahna P. Tamplin's furosemide. I am also having her start on levocetirizine and triamcinolone cream. Additionally, I am having her maintain her accu-chek multiclix, Accu-Chek Aviva Plus, gabapentin, Vitamin D (Ergocalciferol), citalopram, pioglitazone, Eliquis, amLODipine, atorvastatin, vitamin B-12, omeprazole, and nitrofurantoin.  Meds ordered this encounter  Medications  . levocetirizine (XYZAL) 5 MG tablet    Sig: Take 1 tablet (5 mg total) by mouth every evening.    Dispense:  90 tablet    Refill:  1  . triamcinolone cream (KENALOG) 0.5 %    Sig: Apply 1 application topically 3 (three) times daily.    Dispense:  60 g    Refill:  2   I spent 50 minutes in preparing to see the patient by review of recent labs, imaging and procedures,  obtaining and reviewing separately obtained history, communicating with the patient and family or caregiver, ordering medications, tests or procedures, and documenting clinical information in the EHR including the differential Dx, treatment, and any further evaluation and other management of 1. Essential hypertension 2. Acquired hypothyroidism 3. Type 2 diabetes mellitus with stage 3a chronic kidney disease, without long-term current use of insulin ([place]) 4. Seasonal allergic rhinitis due to pollen 5. Intrinsic eczema 6. Chronic kidney disease (CKD), active medical management without dialysis, stage 3 (moderate) ([place])     Follow-up: Return in about 3 months (around 05/05/2020).  Scarlette Calico, MD

## 2020-02-08 ENCOUNTER — Other Ambulatory Visit: Payer: Self-pay

## 2020-02-08 MED ORDER — OMEPRAZOLE 20 MG PO CPDR
20.0000 mg | DELAYED_RELEASE_CAPSULE | Freq: Two times a day (BID) | ORAL | 1 refills | Status: DC
Start: 2020-02-08 — End: 2020-03-23

## 2020-02-08 NOTE — Progress Notes (Signed)
Refill request for Omeprazole 20mg  from [facility].

## 2020-02-09 ENCOUNTER — Other Ambulatory Visit: Payer: Self-pay | Admitting: Internal Medicine

## 2020-02-09 ENCOUNTER — Telehealth: Payer: Self-pay

## 2020-02-09 DIAGNOSIS — L2084 Intrinsic (allergic) eczema: Secondary | ICD-10-CM

## 2020-02-09 DIAGNOSIS — J301 Allergic rhinitis due to pollen: Secondary | ICD-10-CM

## 2020-02-09 MED ORDER — TRIAMCINOLONE ACETONIDE 0.5 % EX CREA: 1.0000 "application " | TOPICAL_CREAM | Freq: Two times a day (BID) | CUTANEOUS | 1 refills | Status: DC

## 2020-02-09 MED ORDER — LEVOCETIRIZINE DIHYDROCHLORIDE 5 MG PO TABS: 5.0000 mg | ORAL_TABLET | Freq: Every evening | ORAL | 1 refills | Status: DC

## 2020-02-09 NOTE — Telephone Encounter (Signed)
Called and LM for pt and her son, [name] that pt is due for labs.

## 2020-02-09 NOTE — Telephone Encounter (Signed)
-----   Message from Roetta Sessions, [place] sent at 12/16/2019  9:36 AM EDT ----- Regarding: cbc due in 01-2020 Cbc due in Nov. 2021 - order is in

## 2020-02-16 ENCOUNTER — Other Ambulatory Visit (INDEPENDENT_AMBULATORY_CARE_PROVIDER_SITE_OTHER): Payer: Medicare Other

## 2020-02-16 DIAGNOSIS — D509 Iron deficiency anemia, unspecified: Secondary | ICD-10-CM | POA: Diagnosis not present

## 2020-02-16 LAB — CBC WITH DIFFERENTIAL/PLATELET
Basophils Absolute: 0.1 10*3/uL (ref 0.0–0.1)
Basophils Relative: 0.8 % (ref 0.0–3.0)
Eosinophils Absolute: 0.2 10*3/uL (ref 0.0–0.7)
Eosinophils Relative: 2.7 % (ref 0.0–5.0)
HCT: 32.8 % — ABNORMAL LOW (ref 36.0–46.0)
Hemoglobin: 11.2 g/dL — ABNORMAL LOW (ref 12.0–15.0)
Lymphocytes Relative: 15.9 % (ref 12.0–46.0)
Lymphs Abs: 1.1 10*3/uL (ref 0.7–4.0)
MCHC: 34.1 g/dL (ref 30.0–36.0)
MCV: 98.1 fl (ref 78.0–100.0)
Monocytes Absolute: 0.7 10*3/uL (ref 0.1–1.0)
Monocytes Relative: 9.8 % (ref 3.0–12.0)
Neutro Abs: 4.8 10*3/uL (ref 1.4–7.7)
Neutrophils Relative %: 70.8 % (ref 43.0–77.0)
Platelets: 189 10*3/uL (ref 150.0–400.0)
RBC: 3.34 Mil/uL — ABNORMAL LOW (ref 3.87–5.11)
RDW: 15.2 % (ref 11.5–15.5)
WBC: 6.9 10*3/uL (ref 4.0–10.5)

## 2020-02-17 ENCOUNTER — Other Ambulatory Visit: Payer: Self-pay

## 2020-02-17 DIAGNOSIS — D509 Iron deficiency anemia, unspecified: Secondary | ICD-10-CM

## 2020-02-18 ENCOUNTER — Telehealth: Payer: Self-pay | Admitting: Oncology

## 2020-02-18 NOTE — Telephone Encounter (Signed)
Received a new hem referral from Dr. Havery Moros for IDA> Penny Miller has been scheduled to see Dr. Alen Blew on 12/7 at 11am. Appt date and time has been given to the pt's son.

## 2020-02-21 ENCOUNTER — Ambulatory Visit (INDEPENDENT_AMBULATORY_CARE_PROVIDER_SITE_OTHER): Payer: Medicare Other

## 2020-02-21 DIAGNOSIS — I639 Cerebral infarction, unspecified: Secondary | ICD-10-CM

## 2020-02-21 LAB — CUP PACEART REMOTE DEVICE CHECK
Date Time Interrogation Session: 20211121232609
Implantable Pulse Generator Implant Date: 20180502

## 2020-02-22 NOTE — Progress Notes (Signed)
Carelink Summary Report / Loop Recorder 

## 2020-03-03 ENCOUNTER — Encounter: Payer: Self-pay | Admitting: Podiatry

## 2020-03-04 ENCOUNTER — Other Ambulatory Visit (HOSPITAL_COMMUNITY): Payer: Self-pay | Admitting: Internal Medicine

## 2020-03-06 NOTE — Telephone Encounter (Signed)
Pt last saw Dr Lovena Le 04/20/19, last labs 02/03/20 Creat 1.44, age 86, weight 78.5kg, based on specified criteria pt is on appropriate dosage of Eliquis 5mg  BID.  Will refill rx.

## 2020-03-07 ENCOUNTER — Telehealth: Payer: Self-pay | Admitting: Oncology

## 2020-03-07 ENCOUNTER — Encounter: Payer: Medicare Other | Admitting: Nutrition

## 2020-03-07 ENCOUNTER — Inpatient Hospital Stay: Payer: Medicare Other | Attending: Oncology | Admitting: Oncology

## 2020-03-07 ENCOUNTER — Other Ambulatory Visit: Payer: Self-pay

## 2020-03-07 ENCOUNTER — Inpatient Hospital Stay: Payer: Medicare Other

## 2020-03-07 VITALS — BP 158/68 | HR 67 | Temp 96.1°F | Resp 18 | Ht 67.0 in | Wt 175.1 lb

## 2020-03-07 DIAGNOSIS — Z79899 Other long term (current) drug therapy: Secondary | ICD-10-CM | POA: Diagnosis not present

## 2020-03-07 DIAGNOSIS — D500 Iron deficiency anemia secondary to blood loss (chronic): Secondary | ICD-10-CM

## 2020-03-07 DIAGNOSIS — D509 Iron deficiency anemia, unspecified: Secondary | ICD-10-CM | POA: Insufficient documentation

## 2020-03-07 DIAGNOSIS — I639 Cerebral infarction, unspecified: Secondary | ICD-10-CM

## 2020-03-07 LAB — CBC WITH DIFFERENTIAL (CANCER CENTER ONLY)
Abs Immature Granulocytes: 0.03 10*3/uL (ref 0.00–0.07)
Basophils Absolute: 0.1 10*3/uL (ref 0.0–0.1)
Basophils Relative: 1 %
Eosinophils Absolute: 0.3 10*3/uL (ref 0.0–0.5)
Eosinophils Relative: 4 %
HCT: 33.8 % — ABNORMAL LOW (ref 36.0–46.0)
Hemoglobin: 10.8 g/dL — ABNORMAL LOW (ref 12.0–15.0)
Immature Granulocytes: 0 %
Lymphocytes Relative: 14 %
Lymphs Abs: 1.2 10*3/uL (ref 0.7–4.0)
MCH: 32 pg (ref 26.0–34.0)
MCHC: 32 g/dL (ref 30.0–36.0)
MCV: 100 fL (ref 80.0–100.0)
Monocytes Absolute: 0.9 10*3/uL (ref 0.1–1.0)
Monocytes Relative: 10 %
Neutro Abs: 6 10*3/uL (ref 1.7–7.7)
Neutrophils Relative %: 71 %
Platelet Count: 183 10*3/uL (ref 150–400)
RBC: 3.38 MIL/uL — ABNORMAL LOW (ref 3.87–5.11)
RDW: 14.1 % (ref 11.5–15.5)
WBC Count: 8.5 10*3/uL (ref 4.0–10.5)
nRBC: 0 % (ref 0.0–0.2)

## 2020-03-07 LAB — IRON AND TIBC
Iron: 49 ug/dL (ref 41–142)
Saturation Ratios: 14 % — ABNORMAL LOW (ref 21–57)
TIBC: 345 ug/dL (ref 236–444)
UIBC: 296 ug/dL (ref 120–384)

## 2020-03-07 LAB — FERRITIN: Ferritin: 18 ng/mL (ref 11–307)

## 2020-03-07 NOTE — Telephone Encounter (Signed)
Scheduled per los. Gave avs and calendar  

## 2020-03-07 NOTE — Progress Notes (Signed)
Reason for the request:    Iron deficiency  HPI: I was asked by Dr. Havery Moros to evaluate Penny Miller for evaluation of iron deficiency.  She is an 86 year old woman with history of atrial fibrillation, hypertension CVA.  She was evaluated by Dr. Havery Moros for recurrent iron deficiency anemia.  She was found to have iron level of 33 with ferritin of 13 in August 2020 with a hemoglobin of 9.5.  She underwent EGD in September 2020 which showed it was essentially negative except for gastritis.  Colonoscopy around the same time showed nonbleeding colonic angiodysplastic lesions with friability in the sigmoid colon and inflammatory changes.  She subsequently received intravenous iron in August 2020 utilizing Feraheme for a total of 1020 mg.  Repeated iron studies in August 2020 showed normalization with the iron level of 65 and a ferritin of 125.  CT scan in November 2020 did not show any acute pathology.   Repeat iron studies in May 2021 showed iron level of 29 with ferritin of 7.3.  Her hemoglobin was down to 9.7 at that time.  She was given the intravenous iron repeat infusion at that time with normalization of her hemoglobin up to 12.7 in July 2021 and in September 2021.  Most recent days CBC in November 2021 showed a hemoglobin of 11.2, white cell count of 6.9 and platelets of 189.    Clinically, he reports no major complaints at this time.  She does report some mild fatigue tiredness but overall no hematochezia or melena.  She is taking over-the-counter oral supplement periodically.  She does not report any headaches, blurry vision, syncope or seizures. Does not report any fevers, chills or sweats.  Does not report any cough, wheezing or hemoptysis.  Does not report any chest pain, palpitation, orthopnea or leg edema.  Does not report any nausea, vomiting or abdominal pain.  Does not report any constipation or diarrhea.  Does not report any skeletal complaints.    Does not report frequency, urgency or  hematuria.  Does not report any skin rashes or lesions. Does not report any heat or cold intolerance.  Does not report any lymphadenopathy or petechiae.  Does not report any anxiety or depression.  Remaining review of systems is negative.    Past Medical History:  Diagnosis Date  . Allergic rhinitis, cause unspecified   . Anxiety state, unspecified   . Arthritis   . Colon polyp   . Diaphragmatic hernia without mention of obstruction or gangrene   . Diverticulosis of colon (without mention of hemorrhage)   . Female stress incontinence   . HTN (hypertension)   . Insomnia, unspecified   . Interstitial cystitis    surgery 02/01/2013  . Irritable bowel syndrome   . Memory loss   . Other and unspecified hyperlipidemia   . Other premature beats   . Polyneuropathy in diabetes(357.2)   . Postmenopausal bleeding   . Proteinuria   . Rectal ulcer   . Skin cancer   . Stroke ([place]) 2018  . 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 Unspecified essential hypertension   . Viral warts, unspecified   :  Past Surgical History:  Procedure Laterality Date  . ABDOMINAL HYSTERECTOMY  1977   Dr.Hambright  . APPENDECTOMY  1965  . CATARACT EXTRACTION, BILATERAL  02/10/2006   Dr.Groat   . COLONOSCOPY  07/17/2004   polypectomy  . [place]  . LOOP RECORDER  INSERTION N/A 07/31/2016   Procedure: Loop Recorder Insertion;  Surgeon: [name], MD;  Location: [facility];  Service: Cardiovascular;  Laterality: N/A;  . TEE WITHOUT CARDIOVERSION N/A 07/30/2016   Procedure: TRANSESOPHAGEAL ECHOCARDIOGRAM (TEE);  Surgeon: [name], MD;  Location: [facility];  Service: Cardiovascular;  Laterality: N/A;  . [place]  . TRIGGER FINGER RELEASE  05/13/16  . urinary bladder      surgery 02/01/2013 to stretch bladder stem  :   Current Outpatient Medications:  .   ACCU-CHEK AVIVA PLUS test strip, USE TO TEST BLOOD SUGAR TWICE DAILY., Disp: 100 each, Rfl: 11 .  amLODipine (NORVASC) 2.5 MG tablet, Take 1 tablet (2.5 mg total) by mouth 2 (two) times daily., Disp: 180 tablet, Rfl: 1 .  atorvastatin (LIPITOR) 40 MG tablet, TAKE 1 TABLET DAILY AT 6 P.M., Disp: 90 tablet, Rfl: 3 .  citalopram (CELEXA) 10 MG tablet, TAKE 1 TABLET DAILY, Disp: 90 tablet, Rfl: 3 .  ELIQUIS 5 MG TABS tablet, TAKE 1 TABLET TWICE A DAY, Disp: 180 tablet, Rfl: 1 .  gabapentin (NEURONTIN) 300 MG capsule, Take 1 capsule (300 mg total) by mouth 3 (three) times daily., Disp: 270 capsule, Rfl: 3 .  Lancets (ACCU-CHEK MULTICLIX) lancets, Use to test blood sugar twice daily. E11.29, Disp: 100 each, Rfl: 11 .  levocetirizine (XYZAL) 5 MG tablet, Take 1 tablet (5 mg total) by mouth every evening., Disp: 90 tablet, Rfl: 1 .  nitrofurantoin (MACRODANTIN) 100 MG capsule, Take 100 mg by mouth at bedtime., Disp: , Rfl:  .  omeprazole (PRILOSEC) 20 MG capsule, Take 1 capsule (20 mg total) by mouth 2 (two) times daily., Disp: 180 capsule, Rfl: 1 .  pioglitazone (ACTOS) 15 MG tablet, TAKE 1 TABLET DAILY, Disp: 90 tablet, Rfl: 3 .  triamcinolone cream (KENALOG) 0.5 %, Apply 1 application topically 2 (two) times daily., Disp: 454 g, Rfl: 1 .  vitamin B-12 (CYANOCOBALAMIN) 1000 MCG tablet, Take 1,000 mcg by mouth daily., Disp: , Rfl:  .  Vitamin D, Ergocalciferol, (DRISDOL) 1.25 MG (50000 UNIT) CAPS capsule, TAKE 1 CAPSULE ONCE WEEKLY FOR VITAMIN D, Disp: 12 capsule, Rfl: 3:  Allergies  Allergen Reactions  . Jardiance [Empagliflozin] Diarrhea  . Metformin And Related Diarrhea  . Ceclor [Cefaclor] Other (See Comments)    Unknown reaction; not recalled  . Codeine Nausea And Vomiting  . Lopid [Gemfibrozil] Other (See Comments)    Unknown reaction; not recalled  . Metoprolol Nausea And Vomiting  . Morphine And Related Other (See Comments)    Passed out  . Niacin Nausea And Vomiting    Unknown  reaction Unknown reaction; not recalled  . Niacin And Related Other (See Comments)    Unknown reaction; not recalled  . Relafen [Nabumetone] Nausea And Vomiting  . Septra [Sulfamethoxazole-Trimethoprim] Nausea And Vomiting  . Tetracycline Nausea And Vomiting  . Toprol Xl [Metoprolol Tartrate] Nausea And Vomiting  . Adhesive [Tape] Rash and Other (See Comments)    Tears the skin also  :  Family History  Problem Relation Age of Onset  . Pancreatic cancer Mother 51  . Heart attack Father   . Bladder Cancer Father   . Cancer Father   . Hypertension Sister   . Diabetes Sister   . COPD Sister   . Hypertension Son   . Diabetes Brother   . Heart disease Brother   . Hypertension Brother   . Diabetes Brother   .  Heart disease Brother 75       Myocardial Infarction   . Heart attack Brother   :  Social History   Socioeconomic History  . Marital status: Widowed    Spouse name: Not on file  . Number of children: 1  . Years of education: Not on file  . Highest education level: Not on file  Occupational History  . 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 Smoking status: Former Smoker    Packs/day: 0.25    Years: 62.00    Pack years: 15.50    Quit date: 04/02/2011    Years since quitting: 8.9  . Smokeless tobacco: Never Used  . Tobacco comment: stopped consistently smoking in 2012 but still smokes occasionally (one pack per week)  Vaping Use  . Vaping Use: Never used  Substance and Sexual Activity  . Alcohol use: No  . Drug use: No  . Sexual activity: Not Currently  Other Topics Concern  . Not on file  Social History Narrative   Ophthalmologist-Dr.Gould and Dr.Groat   Podiatrist- Dr.Tuchman   Dermatologist- Dr.Lupton   Urologist- Dr. Lawerance Bach    Lives with her son.    Social Determinants of Health   Financial Resource Strain:   . Difficulty of Paying Living Expenses: Not on file  Food Insecurity:   . Worried About Charity fundraiser  in the Last Year: Not on file  . Ran Out of Food in the Last Year: Not on file  Transportation Needs:   . Lack of Transportation (Medical): Not on file  . Lack of Transportation (Non-Medical): Not on file  Physical Activity:   . Days of Exercise per Week: Not on file  . Minutes of Exercise per Session: Not on file  Stress:   . Feeling of Stress : Not on file  Social Connections:   . Frequency of Communication with Friends and Family: Not on file  . Frequency of Social Gatherings with Friends and Family: Not on file  . Attends Religious Services: Not on file  . Active Member of Clubs or Organizations: Not on file  . Attends Archivist Meetings: Not on file  . Marital Status: Not on file  Intimate Partner Violence:   . Fear of Current or Ex-Partner: Not on file  . Emotionally Abused: Not on file  . Physically Abused: Not on file  . Sexually Abused: Not on file  :    Exam:  Blood pressure (!) 158/68, pulse 67, temperature (!) 96.1 F (35.6 C), temperature source Tympanic, resp. rate 18, height 5\' 7"  (1.702 m), weight 175 lb 1.6 oz (79.4 kg), SpO2 99 %.    ECOG 1   General appearance: alert and cooperative appeared without distress. Head: atraumatic without any abnormalities. Eyes: conjunctivae/corneas clear. PERRL.  Sclera anicteric. Throat: lips, mucosa, and tongue normal; without oral thrush or ulcers. Resp: clear to auscultation bilaterally without rhonchi, wheezes or dullness to percussion. Cardio: regular rate and rhythm, S1, S2 normal, no murmur, click, rub or gallop GI: soft, non-tender; bowel sounds normal; no masses,  no organomegaly Skin: Skin color, texture, turgor normal. No rashes or lesions Lymph nodes: Cervical, supraclavicular, and axillary nodes normal. Neurologic: Grossly normal without any motor, sensory or deep tendon reflexes. Musculoskeletal: No joint deformity or effusion.  CBC    Component Value Date/Time   WBC 6.9 02/16/2020 1151    RBC 3.34 (L) 02/16/2020 1151   HGB 11.2 (L)  02/16/2020 1151   HGB 10.4 (L) 11/11/2018 0846   HCT 32.8 (L) 02/16/2020 1151   HCT 34.2 11/11/2018 0846   PLT 189.0 02/16/2020 1151   PLT 269 11/11/2018 0846   MCV 98.1 02/16/2020 1151   MCV 86 11/11/2018 0846   MCH 29.4 08/09/2019 0903   MCHC 34.1 02/16/2020 1151   RDW 15.2 02/16/2020 1151   RDW 14.9 11/11/2018 0846   LYMPHSABS 1.1 02/16/2020 1151   MONOABS 0.7 02/16/2020 1151   EOSABS 0.2 02/16/2020 1151   BASOSABS 0.1 02/16/2020 1151   Results for Penny Miller, Penny Miller (MRN [id number]) as of 03/07/2020 10:41  Ref. Range 11/02/2018 14:08 01/29/2019 15:35 08/19/2019 11:01  Iron Latest Ref Range: 42 - 145 ug/dL 33 (L) 65 29 (L)  TIBC Latest Ref Range: 250 - 450 mcg/dL (calc) 456 (H)    %SAT Latest Ref Range: 16 - 45 % (calc) 7 (L)    Saturation Ratios Latest Ref Range: 20.0 - 50.0 %  21.6 6.1 (L)  Ferritin Latest Ref Range: 10.0 - 291.0 ng/mL 13 (L) 125.7 7.3 (L)  Transferrin Latest Ref Range: 212.0 - 360.0 mg/dL  215.0 339.0    Assessment and Plan:   86 year old with:  1.  Recurrent iron deficiency anemia diagnosed in August 2020.  She presented with iron level of 33 with ferritin of 13.  The etiology of her iron deficiency is related to chronic GI blood losses including angiodysplasia and colitis.  This exacerbated by chronic anticoagulation.  She is status post intravenous iron infusion on 2 separate occasions most recent was in May 2021.  The natural course of her disease was reviewed at this time and treatment options were discussed.  Etiology of her iron deficiency could be combination of chronic blood losses as well as a poor iron absorption.  I do not suspect any evidence of malignancy or any hematological condition at this time.  From a management standpoint, oral iron replacement versus repeat intravenous iron were discussed.  She has been intolerant to oral iron which could be also ineffective.  Risks and benefits of repeat intravenous  iron infusion were discussed.  Complications include arthralgias, myalgias and infusion related issues were reviewed.  After discussion today, she is agreeable to proceed with intravenous iron after we update her iron studies today.  Her hemoglobin is drifting unlikely she will require in the near future.   2.  Angiodysplasia of the colon: This was detected on colonoscopy in September 2020.  She has been evaluated by Dr. Havery Moros regarding this issue.  3.  Follow-up: In 4 months for repeat follow-up.  45  minutes were dedicated to this visit. The time was spent on reviewing laboratory data, discussing treatment options, discussing differential diagnosis and answering questions regarding future plan.     A copy of this consult has been forwarded to the requesting physician.

## 2020-03-14 ENCOUNTER — Encounter: Payer: Self-pay | Admitting: Podiatry

## 2020-03-15 ENCOUNTER — Inpatient Hospital Stay: Payer: Medicare Other

## 2020-03-15 ENCOUNTER — Other Ambulatory Visit: Payer: Self-pay

## 2020-03-15 VITALS — BP 160/74 | HR 62 | Temp 98.0°F | Resp 18

## 2020-03-15 DIAGNOSIS — D500 Iron deficiency anemia secondary to blood loss (chronic): Secondary | ICD-10-CM

## 2020-03-15 DIAGNOSIS — D509 Iron deficiency anemia, unspecified: Secondary | ICD-10-CM | POA: Diagnosis not present

## 2020-03-15 DIAGNOSIS — Z79899 Other long term (current) drug therapy: Secondary | ICD-10-CM | POA: Diagnosis not present

## 2020-03-15 MED ORDER — SODIUM CHLORIDE 0.9 % IV SOLN
510.0000 mg | Freq: Once | INTRAVENOUS | Status: AC
  Administered 2020-03-15: 12:00:00 510 mg via INTRAVENOUS
  Filled 2020-03-15: qty 510

## 2020-03-15 MED ORDER — SODIUM CHLORIDE 0.9 % IV SOLN
Freq: Once | INTRAVENOUS | Status: AC
  Filled 2020-03-15: qty 250

## 2020-03-15 NOTE — Patient Instructions (Signed)

## 2020-03-22 ENCOUNTER — Other Ambulatory Visit: Payer: Self-pay | Admitting: Internal Medicine

## 2020-03-22 ENCOUNTER — Inpatient Hospital Stay: Payer: Medicare Other

## 2020-03-22 ENCOUNTER — Other Ambulatory Visit: Payer: Self-pay

## 2020-03-22 VITALS — BP 139/50 | HR 16 | Temp 97.7°F | Resp 17

## 2020-03-22 DIAGNOSIS — D509 Iron deficiency anemia, unspecified: Secondary | ICD-10-CM | POA: Diagnosis not present

## 2020-03-22 DIAGNOSIS — D500 Iron deficiency anemia secondary to blood loss (chronic): Secondary | ICD-10-CM

## 2020-03-22 DIAGNOSIS — Z79899 Other long term (current) drug therapy: Secondary | ICD-10-CM | POA: Diagnosis not present

## 2020-03-22 MED ORDER — SODIUM CHLORIDE 0.9 % IV SOLN
Freq: Once | INTRAVENOUS | Status: AC
  Filled 2020-03-22: qty 250

## 2020-03-22 MED ORDER — SODIUM CHLORIDE 0.9 % IV SOLN
510.0000 mg | Freq: Once | INTRAVENOUS | Status: AC
  Administered 2020-03-22: 10:00:00 510 mg via INTRAVENOUS
  Filled 2020-03-22: qty 510

## 2020-03-22 NOTE — Progress Notes (Signed)
Pt reports she had episode of very bloody diarrhea @ 0100, bright red, also had abdominal pain @ the time.  She also noticed some rectal blood dripping in the shower this morning.  Pt C/O some dizziness, denies pain @ present.  Dr. Alen Blew informed, states it is ok to go ahead with today's feraheme.  Per MD if this happens again, pt should go to ED.  Pt informed, verbalizes understanding.

## 2020-03-22 NOTE — Patient Instructions (Signed)

## 2020-03-23 ENCOUNTER — Encounter: Payer: Self-pay | Admitting: Physician Assistant

## 2020-03-23 ENCOUNTER — Other Ambulatory Visit (INDEPENDENT_AMBULATORY_CARE_PROVIDER_SITE_OTHER): Payer: Medicare Other

## 2020-03-23 ENCOUNTER — Ambulatory Visit (INDEPENDENT_AMBULATORY_CARE_PROVIDER_SITE_OTHER): Payer: Medicare Other | Admitting: Physician Assistant

## 2020-03-23 VITALS — BP 142/64 | HR 58 | Ht 67.0 in | Wt 172.8 lb

## 2020-03-23 DIAGNOSIS — D509 Iron deficiency anemia, unspecified: Secondary | ICD-10-CM

## 2020-03-23 DIAGNOSIS — R109 Unspecified abdominal pain: Secondary | ICD-10-CM

## 2020-03-23 DIAGNOSIS — K921 Melena: Secondary | ICD-10-CM | POA: Diagnosis not present

## 2020-03-23 DIAGNOSIS — I639 Cerebral infarction, unspecified: Secondary | ICD-10-CM | POA: Diagnosis not present

## 2020-03-23 LAB — COMPREHENSIVE METABOLIC PANEL
ALT: 10 U/L (ref 0–35)
AST: 14 U/L (ref 0–37)
Albumin: 3.9 g/dL (ref 3.5–5.2)
Alkaline Phosphatase: 73 U/L (ref 39–117)
BUN: 19 mg/dL (ref 6–23)
CO2: 29 mEq/L (ref 19–32)
Calcium: 9.8 mg/dL (ref 8.4–10.5)
Chloride: 103 mEq/L (ref 96–112)
Creatinine, Ser: 1.22 mg/dL — ABNORMAL HIGH (ref 0.40–1.20)
GFR: 40.19 mL/min — ABNORMAL LOW (ref >60.00)
Glucose, Bld: 113 mg/dL — ABNORMAL HIGH (ref 70–99)
Potassium: 4.1 mEq/L (ref 3.5–5.1)
Sodium: 139 mEq/L (ref 135–145)
Total Bilirubin: 0.4 mg/dL (ref 0.2–1.2)
Total Protein: 6.5 g/dL (ref 6.0–8.3)

## 2020-03-23 LAB — IBC PANEL
Iron: 851 ug/dL — ABNORMAL HIGH (ref 42–145)
Saturation Ratios: 259.8 % — ABNORMAL HIGH (ref 20.0–50.0)
Transferrin: 234 mg/dL (ref 212.0–360.0)

## 2020-03-23 LAB — CBC WITH DIFFERENTIAL/PLATELET
Basophils Absolute: 0.1 10*3/uL (ref 0.0–0.1)
Basophils Relative: 0.9 % (ref 0.0–3.0)
Eosinophils Absolute: 0.3 10*3/uL (ref 0.0–0.7)
Eosinophils Relative: 4 % (ref 0.0–5.0)
HCT: 34.4 % — ABNORMAL LOW (ref 36.0–46.0)
Hemoglobin: 11.5 g/dL — ABNORMAL LOW (ref 12.0–15.0)
Lymphocytes Relative: 15.2 % (ref 12.0–46.0)
Lymphs Abs: 1 10*3/uL (ref 0.7–4.0)
MCHC: 33.4 g/dL (ref 30.0–36.0)
MCV: 96.9 fl (ref 78.0–100.0)
Monocytes Absolute: 0.6 10*3/uL (ref 0.1–1.0)
Monocytes Relative: 9.2 % (ref 3.0–12.0)
Neutro Abs: 4.5 10*3/uL (ref 1.4–7.7)
Neutrophils Relative %: 70.7 % (ref 43.0–77.0)
Platelets: 164 10*3/uL (ref 150.0–400.0)
RBC: 3.54 Mil/uL — ABNORMAL LOW (ref 3.87–5.11)
RDW: 14.9 % (ref 11.5–15.5)
WBC: 6.3 10*3/uL (ref 4.0–10.5)

## 2020-03-23 MED ORDER — HYOSCYAMINE SULFATE 0.125 MG SL SUBL: 0.1250 mg | SUBLINGUAL_TABLET | SUBLINGUAL | 2 refills | Status: DC | PRN

## 2020-03-23 MED ORDER — ONDANSETRON 4 MG PO TBDP: 4.0000 mg | ORAL_TABLET | Freq: Four times a day (QID) | ORAL | 5 refills | Status: DC

## 2020-03-23 NOTE — Progress Notes (Signed)
Agree with assessment and plan as outlined. Repeat CBC today shows stable Hgb and WBC normal, all reassuring. Not sure what caused her bleeding - she does have one area of severe diverticular changes in left colon, while ischemic colitis possible. Regarding her chronic iron deficiency she has a very large right sided colonic AVMs, likely causing this. It is high risk for bleeding with endoscopic intervention, which would need to be done at the hospital. I'd like to see her back in the office in a few weeks to see how she is doing and see how aggressive she wants to be with management of this moving forward.  If she has recurrence of symptoms in the interim she should contact us.   Penny Miller, I would like to add this patient into my clinic in the next 2-3 weeks if you can find an opening. Thanks

## 2020-03-23 NOTE — Patient Instructions (Addendum)
If you are age 84 or older, your body mass index should be between 23-30. Your Body mass index is 27.06 kg/m. If this is out of the aforementioned range listed, please consider follow up with your Primary Care Provider.  If you are age 36 or younger, your body mass index should be between 19-25. Your Body mass index is 27.06 kg/m. If this is out of the aformentioned range listed, please consider follow up with your Primary Care Provider.   We have sent the following medications to your pharmacy for you to pick up at your convenience: Hyoscyamine 0.125 mg , Zofran 4 mg  Your provider has requested that you go to the basement level for lab work before leaving today. Press "B" on the elevator. The lab is located at the first door on the left as you exit the elevator.  Thank you for choosing me and [facility].  Ellouise Newer ,PA-C

## 2020-03-23 NOTE — Progress Notes (Signed)
Chief Complaint: Blood in stool  HPI:    Penny Miller is an 86 year old Caucasian female with a past medical history as listed below, known to Dr. Havery Moros, who was referred to me by [name], MD for a complaint of blood in stool.      11/02/2019 patient saw Dr. Havery Moros in clinic for iron deficiency anemia.  She had recently had an EGD and colonoscopy for this issue back in 2020.  Of suspected iron deficiency anemia is most likely related to AVMs in the colon in the setting of anticoagulation.  She had been intolerant of oral iron and was given 2 doses of IV iron and her hemoglobin had normalized to 12.7.  It was discussed that we would keep a periodic iron her hemoglobin and possibly she would have IV iron if needed.  Also recommend she go on extra iron or lower dose oral iron supplementation over-the-counter.  It was thought she had some inflammatory changes from diverticulosis on her recent colonoscopy.  Discussed that if she had repeat overt bleeding then may need to consider repeat colonoscopy at the hospital to reevaluate the left colon and treat colonic AVMs with APC.    02/16/2020 patient's hemoglobin was noted to be 11.2.  It was asked to place a referral for IV iron.    03/07/2020 CBC with a hemoglobin of 10.8 (11.2 on 11/17).  Ferritin low at 18.  Percent saturation low at 14.    03/07/2020 patient saw Dr. Alen Blew for iron deficiency anemia.    03/15/2020 and 03/22/20 patient had iron infusion.    12/22 patient called and reported that at 1:00 in the morning she had very bloody diarrhea and abdominal pain, noticed some rectal bleeding dripping in the shower yesterday morning and had some dizziness.  She did have an iron infusion yesterday.    Today, the patient presents to clinic accompanied by her son and tells me that on 03/20/2020 she just could not get to sleep and at about 1:00 am on 03/21/20 she had an urgent need to have a bowel movement with a lot of intense abdominal cramping.   She proceeded to the toilet and had a large amount of bright red blood in the toilet, this lasted for about 2 minutes and thereafter she continued with some more bloody bowel movements.  Her abdomen continued to be somewhat sore over the next 24 hours, yesterday she passed some darker looking stool, initially was very dizzy, now feeling better after her iron infusion yesterday.  Tells me she continues with abdominal soreness and has not passed a real stool, continues a small amount of bright red blood, but "nothing like it was".  Associated symptoms include some nausea.    Denies fever, chills, syncope or weight loss.  Recent GI procedures: EGD 12/16/18 -- Localized mucosal change, around 50mm in diameter, were found in the middle third of the esophagus around 26cm of the incisors. Biopsies show glycogenic acanthosis.The exam of the esophagus was otherwise normal.Gastritis, negative biopsies for HP.A large diverticulum was found in the second portion of the duodenum. The exam of the duodenum was otherwise normal.  Colonoscopy 12/16/18 -Four non-bleeding colonic angiodysplastic lesions, one of them quite large at the IC valve.One 5 mm polyp in the descending colon.Diverticulosis in the sigmoid colon. Friability in the sigmoid colon with inflammatory changes - I suspect reactive (? diverticilitis /diverticular disease). Biopsied. Tattooed. - One 3 mm polyp at the recto-sigmoid colon.The examination was otherwise normal.  Past Medical  History:  Diagnosis Date  . Allergic rhinitis, cause unspecified   . Anxiety state, unspecified   . Arthritis   . Colon polyp   . Diaphragmatic hernia without mention of obstruction or gangrene   . Diverticulosis of colon (without mention of hemorrhage)   . Female stress incontinence   . HTN (hypertension)   . Insomnia, unspecified   . Interstitial cystitis    surgery 02/01/2013  . Irritable bowel syndrome   . Memory loss   . Other and unspecified  hyperlipidemia   . Other premature beats   . Polyneuropathy in diabetes(357.2)   . Postmenopausal bleeding   . Proteinuria   . Rectal ulcer   . Skin cancer   . Stroke ([place]) 2018  . 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 Unspecified essential hypertension   . Viral warts, unspecified     Past Surgical History:  Procedure Laterality Date  . ABDOMINAL HYSTERECTOMY  1977   Dr.Hambright  . APPENDECTOMY  1965  . CATARACT EXTRACTION, BILATERAL  02/10/2006   Dr.Groat   . COLONOSCOPY  07/17/2004   polypectomy  . [place]  . LOOP RECORDER INSERTION N/A 07/31/2016   Procedure: Loop Recorder Insertion;  Surgeon: [name], MD;  Location: [facility];  Service: Cardiovascular;  Laterality: N/A;  . TEE WITHOUT CARDIOVERSION N/A 07/30/2016   Procedure: TRANSESOPHAGEAL ECHOCARDIOGRAM (TEE);  Surgeon: [name], MD;  Location: [facility];  Service: Cardiovascular;  Laterality: N/A;  . [place]  . TRIGGER FINGER RELEASE  05/13/16  . urinary bladder      surgery 02/01/2013 to stretch bladder stem    Current Outpatient Medications  Medication Sig Dispense Refill  . ACCU-CHEK AVIVA PLUS test strip USE TO TEST BLOOD SUGAR TWICE DAILY. 100 each 11  . amLODipine (NORVASC) 2.5 MG tablet Take 1 tablet (2.5 mg total) by mouth 2 (two) times daily. 180 tablet 1  . atorvastatin (LIPITOR) 40 MG tablet TAKE 1 TABLET DAILY AT 6 P.M. 90 tablet 3  . citalopram (CELEXA) 10 MG tablet TAKE 1 TABLET DAILY 90 tablet 3  . ELIQUIS 5 MG TABS tablet TAKE 1 TABLET TWICE A DAY 180 tablet 1  . gabapentin (NEURONTIN) 300 MG capsule Take 1 capsule (300 mg total) by mouth 3 (three) times daily. 270 capsule 3  . Lancets (ACCU-CHEK MULTICLIX) lancets Use to test blood sugar twice daily. E11.29 100 each 11  . levocetirizine (XYZAL) 5 MG tablet Take 1 tablet (5 mg total) by mouth  every evening. 90 tablet 1  . nitrofurantoin (MACRODANTIN) 100 MG capsule Take 100 mg by mouth at bedtime.    Marland Kitchen omeprazole (PRILOSEC) 20 MG capsule Take 1 capsule (20 mg total) by mouth 2 (two) times daily. 180 capsule 1  . pioglitazone (ACTOS) 15 MG tablet TAKE 1 TABLET DAILY 90 tablet 3  . triamcinolone cream (KENALOG) 0.5 % Apply 1 application topically 2 (two) times daily. 454 g 1  . vitamin B-12 (CYANOCOBALAMIN) 1000 MCG tablet Take 1,000 mcg by mouth daily.    . Vitamin D, Ergocalciferol, (DRISDOL) 1.25 MG (50000 UNIT) CAPS capsule TAKE 1 CAPSULE ONCE WEEKLY FOR VITAMIN D 12 capsule 3   No current facility-administered medications for this visit.    Allergies as of 03/23/2020 - Review Complete 03/15/2020  Allergen Reaction Noted  . Jardiance [empagliflozin] Diarrhea 12/15/2017  . Metformin and related  Diarrhea 06/09/2017  . Ceclor [cefaclor] Other (See Comments) 08/28/2012  . Codeine Nausea And Vomiting 08/28/2012  . Lopid [gemfibrozil] Other (See Comments) 08/28/2012  . Metoprolol Nausea And Vomiting 07/18/2015  . Morphine and related Other (See Comments) 08/28/2012  . Niacin Nausea And Vomiting 08/28/2012  . Niacin and related Other (See Comments) 08/28/2012  . Relafen [nabumetone] Nausea And Vomiting 08/28/2012  . Septra [sulfamethoxazole-trimethoprim] Nausea And Vomiting 08/28/2012  . Tetracycline Nausea And Vomiting 02/28/2015  . Toprol xl [metoprolol tartrate] Nausea And Vomiting 08/28/2012  . Adhesive [tape] Rash and Other (See Comments) 06/15/2017    Family History  Problem Relation Age of Onset  . Pancreatic cancer Mother 50  . Heart attack Father   . Bladder Cancer Father   . Cancer Father   . Hypertension Sister   . Diabetes Sister   . COPD Sister   . Hypertension Son   . Diabetes Brother   . Heart disease Brother   . Hypertension Brother   . Diabetes Brother   . Heart disease Brother 75       Myocardial Infarction   . Heart attack Brother     Social  History   Socioeconomic History  . Marital status: Widowed    Spouse name: Not on file  . Number of children: 1  . Years of education: Not on file  . Highest education level: Not on file  Occupational History  . 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 Smoking status: Former Smoker    Packs/day: 0.25    Years: 62.00    Pack years: 15.50    Quit date: 04/02/2011    Years since quitting: 8.9  . Smokeless tobacco: Never Used  . Tobacco comment: stopped consistently smoking in 2012 but still smokes occasionally (one pack per week)  Vaping Use  . Vaping Use: Never used  Substance and Sexual Activity  . Alcohol use: No  . Drug use: No  . Sexual activity: Not Currently  Other Topics Concern  . Not on file  Social History Narrative   Ophthalmologist-Dr.Gould and Dr.Groat   Podiatrist- Dr.Tuchman   Dermatologist- Dr.Lupton   Urologist- Dr. Lawerance Bach    Lives with her son.    Social Determinants of Health   Financial Resource Strain: Not on file  Food Insecurity: Not on file  Transportation Needs: Not on file  Physical Activity: Not on file  Stress: Not on file  Social Connections: Not on file  Intimate Partner Violence: Not on file    Review of Systems:    Constitutional: No weight loss, fever or chills Cardiovascular: No chest pains   Respiratory: No SOB Gastrointestinal: See HPI and otherwise negative   Physical Exam:  Vital signs: BP (!) 142/64   Pulse (!) 58   Ht 5\' 7"  (1.702 m)   Wt 172 lb 12.8 oz (78.4 kg)   SpO2 99%   BMI 27.06 kg/m   Constitutional:   Pleasant Elderly Caucasian female appears to be in NAD, Well developed, Well nourished, alert and cooperative Respiratory: Respirations even and unlabored. Lungs clear to auscultation bilaterally.   No wheezes, crackles, or rhonchi.  Cardiovascular: Normal S1, S2. No MRG. Regular rate and rhythm. No peripheral edema, cyanosis or pallor.  Gastrointestinal:  Soft, nondistended,  moderate generalized ttp. No rebound or guarding. Normal bowel sounds. No appreciable masses or hepatomegaly. Rectal:  Not performed.  Psychiatric:  Demonstrates good judgement and reason without abnormal affect or  behaviors.  RELEVANT LABS AND IMAGING: CBC    Component Value Date/Time   WBC 8.5 03/07/2020 1146   WBC 6.9 02/16/2020 1151   RBC 3.38 (L) 03/07/2020 1146   HGB 10.8 (L) 03/07/2020 1146   HGB 10.4 (L) 11/11/2018 0846   HCT 33.8 (L) 03/07/2020 1146   HCT 34.2 11/11/2018 0846   PLT 183 03/07/2020 1146   PLT 269 11/11/2018 0846   MCV 100.0 03/07/2020 1146   MCV 86 11/11/2018 0846   MCH 32.0 03/07/2020 1146   MCHC 32.0 03/07/2020 1146   RDW 14.1 03/07/2020 1146   RDW 14.9 11/11/2018 0846   LYMPHSABS 1.2 03/07/2020 1146   MONOABS 0.9 03/07/2020 1146   EOSABS 0.3 03/07/2020 1146   BASOSABS 0.1 03/07/2020 1146    CMP     Component Value Date/Time   NA 140 02/03/2020 1149   NA 136 11/11/2018 0846   K 4.6 02/03/2020 1149   CL 106 02/03/2020 1149   CO2 29 02/03/2020 1149   GLUCOSE 114 (H) 02/03/2020 1149   BUN 23 02/03/2020 1149   BUN 25 11/11/2018 0846   CREATININE 1.44 (H) 02/03/2020 1149   CREATININE 1.42 (H) 08/09/2019 0903   CALCIUM 9.4 02/03/2020 1149   PROT 6.1 08/09/2019 0903   PROT 6.1 06/23/2015 0927   ALBUMIN 3.2 (L) 02/01/2018 0613   ALBUMIN 3.9 06/23/2015 0927   AST 11 08/09/2019 0903   ALT 8 08/09/2019 0903   ALKPHOS 74 02/01/2018 0613   BILITOT 0.4 08/09/2019 0903   BILITOT 0.3 06/23/2015 0927   GFRNONAA 34 (L) 08/09/2019 0903   GFRAA 39 (L) 08/09/2019 0903    Assessment: 1.  Chronic iron deficiency anemia: Suspected from AVMs and possibly colitis exacerbated by blood thinner in the past, now decreasing over the past couple of months with an episode of hematochezia; most likely from the same 2.  Hematochezia: Acute episode after an intense episode of generalized abdominal pain, now decreasing over the past 48 hours; most consistent with  ischemic colitis 3.  Generalized abdominal pain: With above  Plan: 1.  Repeat CBC and CMP today as well as iron studies. 2.  Discussed with patient it sounds most like ischemic colitis, described pathophysiology of this and told her that these episodes are typically self-limited.  I would suspect that she will continue to get better every day, but if for some reason she has another episode of severe pain or has an increase in bleeding or her bleeding has not completely stopped over the next 3 to 4 days and recommend that she proceed to the ER.  Certainly if she becomes dizzy, syncopal or has heart palpitations. 3.  For now remain on a low fiber low residue diet, provided her with a handout. 4.  Prescribe Zofran 4 mg every 4-6 hours as needed for nausea #15 with 2 refills. 5.  Prescribed hyoscyamine sulfate 0.125 mg sublingual tabs as needed for abdominal pain #15 with 2 refills. 6.  Patient will need to follow with Dr. Havery Moros in regards to her chronic iron deficiency anemia in the future.  Scheduled her an appointment with him in 2 months.  May need to discuss role for repeat colonoscopy.  She will call if she has further episodes of above or needs to be seen sooner.  Ellouise Newer, PA-C [facility] 03/23/2020, 8:31 AM  Cc: [name], MD

## 2020-03-26 LAB — CUP PACEART REMOTE DEVICE CHECK
Date Time Interrogation Session: 20211224233311
Implantable Pulse Generator Implant Date: 20180502

## 2020-03-27 ENCOUNTER — Ambulatory Visit (INDEPENDENT_AMBULATORY_CARE_PROVIDER_SITE_OTHER): Payer: Medicare Other

## 2020-03-27 DIAGNOSIS — I639 Cerebral infarction, unspecified: Secondary | ICD-10-CM

## 2020-03-29 ENCOUNTER — Telehealth: Payer: Self-pay

## 2020-03-29 NOTE — Telephone Encounter (Signed)
Dr. Havery MorosHerbert Seta, I would like to add this patient into my clinic in the next 2-3 weeks if you can find an opening. Thanks  Patient has been scheduled for a follow up on Thursday, 04/13/2020 at 9:50 AM.   Lm on vm for patient to return call for updated appointment information. Letter mailed as well.

## 2020-04-04 NOTE — Telephone Encounter (Signed)
Spoke with patient, she is aware of appt information and had no concerns at the end of the call.

## 2020-04-04 NOTE — Telephone Encounter (Signed)
Lm on home vm for patient to return call.   Lm on son's vm to return call for patient's appt information.

## 2020-04-10 NOTE — Progress Notes (Signed)
Carelink Summary Report / Loop Recorder 

## 2020-04-13 ENCOUNTER — Other Ambulatory Visit (INDEPENDENT_AMBULATORY_CARE_PROVIDER_SITE_OTHER): Payer: Medicare Other

## 2020-04-13 ENCOUNTER — Encounter: Payer: Self-pay | Admitting: Gastroenterology

## 2020-04-13 ENCOUNTER — Ambulatory Visit (INDEPENDENT_AMBULATORY_CARE_PROVIDER_SITE_OTHER): Payer: Medicare Other | Admitting: Gastroenterology

## 2020-04-13 VITALS — BP 120/60 | HR 64 | Ht 65.75 in | Wt 171.5 lb

## 2020-04-13 DIAGNOSIS — D509 Iron deficiency anemia, unspecified: Secondary | ICD-10-CM

## 2020-04-13 DIAGNOSIS — K5730 Diverticulosis of large intestine without perforation or abscess without bleeding: Secondary | ICD-10-CM

## 2020-04-13 DIAGNOSIS — R109 Unspecified abdominal pain: Secondary | ICD-10-CM

## 2020-04-13 DIAGNOSIS — K5520 Angiodysplasia of colon without hemorrhage: Secondary | ICD-10-CM

## 2020-04-13 DIAGNOSIS — K625 Hemorrhage of anus and rectum: Secondary | ICD-10-CM

## 2020-04-13 LAB — CBC WITH DIFFERENTIAL/PLATELET
Basophils Absolute: 0.1 10*3/uL (ref 0.0–0.1)
Basophils Relative: 0.8 % (ref 0.0–3.0)
Eosinophils Absolute: 0.1 10*3/uL (ref 0.0–0.7)
Eosinophils Relative: 1.9 % (ref 0.0–5.0)
HCT: 30.4 % — ABNORMAL LOW (ref 36.0–46.0)
Hemoglobin: 10 g/dL — ABNORMAL LOW (ref 12.0–15.0)
Lymphocytes Relative: 11.4 % — ABNORMAL LOW (ref 12.0–46.0)
Lymphs Abs: 0.9 10*3/uL (ref 0.7–4.0)
MCHC: 32.7 g/dL (ref 30.0–36.0)
MCV: 98.4 fl (ref 78.0–100.0)
Monocytes Absolute: 0.7 10*3/uL (ref 0.1–1.0)
Monocytes Relative: 8.7 % (ref 3.0–12.0)
Neutro Abs: 5.9 10*3/uL (ref 1.4–7.7)
Neutrophils Relative %: 77.2 % — ABNORMAL HIGH (ref 43.0–77.0)
Platelets: 164 10*3/uL (ref 150.0–400.0)
RBC: 3.09 Mil/uL — ABNORMAL LOW (ref 3.87–5.11)
RDW: 15.6 % — ABNORMAL HIGH (ref 11.5–15.5)
WBC: 7.7 10*3/uL (ref 4.0–10.5)

## 2020-04-13 NOTE — Progress Notes (Signed)
HPI :  87 year old female here for follow-up visit for iron deficiency anemia in the setting of Eliquis. She takes Eliquis for history of CVA and atrial fibrillation.  She underwent an EGD and colonoscopy as outlined last year for this issue:  EGD 12/16/18 -- Localized mucosal change, around 63mm in diameter, were found in the middle third of the esophagus around 26cm of the incisors. Biopsies show glycogenic acanthosis.The exam of the esophagus was otherwise normal.Gastritis, negative biopsies for HP.A large diverticulum was found in the second portion of the duodenum. The exam of the duodenum was otherwise normal.  Colonoscopy 12/16/18 -Four non-bleeding colonic angiodysplastic lesions, one of them quite large at the IC valve.One 5 mm polyp in the descending colon.Diverticulosis in the sigmoid colon. Friability in the sigmoid colon with inflammatory changes - I suspect reactive (? diverticilitis /diverticular disease). Biopsied. Tattooed. - One 3 mm polyp at the recto-sigmoid colon.The examination was otherwise normal.  I have suspected her iron deficiency anemia is most likely related to AVMs in the colon in the setting of anticoagulation, she also has some inflammatory changes from diverticulosis/diverticulitis, biopsies showed no evidence of malignancy/dysplasia.  Since have last seen her over Christmas she had acute onset abdominal pain with rectal bleeding that lasted 2 days or so and then resolved over time.  She had seen Alonza Bogus who raises the question of possible ischemic colitis.  The patient is CBC with a normal white count and hemoglobin that was improved so we continued monitoring.  The patient since that time has been doing okay.  She has not had any recurrent bleeding that she is aware of.  She has some occasional soreness in her abdomen at all.  She did use Levsin at the time of when she had pain in this helped resolve things.  She continues to take Eliquis for history of  stroke.  She has been on a low fiber diet when she was having abdominal pain and states she has become a little bit constipated lately.  He is accompanied by her son today, we discussed options.   CT Scan  - 02/05/19 - IMPRESSION: 1. No explanation for patient's left lower quadrant pain status post colonoscopy. 2. Aortic Atherosclerosis (ICD10-I70.0). 3. Infrarenal abdominal aortic ectasia. Ectatic abdominal aorta at risk for aneurysm development. Recommend followup by ultrasound in 5 years. This recommendation follows ACR consensus guidelines: White Paper of the ACR Incidental Findings Committee II on Vascular Findings. J Am Coll Radiol 2013; 10:789-794. 4. Advanced lumbar spondylosis noted.    Past Medical History:  Diagnosis Date  . Allergic rhinitis, cause unspecified   . Anxiety state, unspecified   . Arthritis   . Colon polyp   . Diaphragmatic hernia without mention of obstruction or gangrene   . Diverticulosis of colon (without mention of hemorrhage)   . Female stress incontinence   . HTN (hypertension)   . Insomnia, unspecified   . Interstitial cystitis    surgery 02/01/2013  . Irritable bowel syndrome   . Memory loss   . Other and unspecified hyperlipidemia   . Other premature beats   . Polyneuropathy in diabetes(357.2)   . Postmenopausal bleeding   . Proteinuria   . Rectal ulcer   . Skin cancer   . Stroke ([place]) 2018  . 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 Unspecified essential hypertension   . Viral warts, unspecified      Past Surgical History:  Procedure  Laterality Date  . ABDOMINAL HYSTERECTOMY  1977   Dr.Hambright  . APPENDECTOMY  1965  . CATARACT EXTRACTION, BILATERAL  02/10/2006   Dr.Groat   . COLONOSCOPY  07/17/2004   polypectomy  . [place]  . LOOP RECORDER INSERTION N/A 07/31/2016   Procedure: Loop Recorder Insertion;  Surgeon: [name], MD;  Location: [facility];  Service: Cardiovascular;  Laterality: N/A;  . TEE WITHOUT CARDIOVERSION N/A 07/30/2016   Procedure: TRANSESOPHAGEAL ECHOCARDIOGRAM (TEE);  Surgeon: [name], MD;  Location: [facility];  Service: Cardiovascular;  Laterality: N/A;  . [place]  . TRIGGER FINGER RELEASE  05/13/16  . urinary bladder      surgery 02/01/2013 to stretch bladder stem   Family History  Problem Relation Age of Onset  . Pancreatic cancer Mother 83  . Heart attack Father   . Bladder Cancer Father   . Hypertension Sister   . Diabetes Sister   . COPD Sister   . Hypertension Son   . Diabetes Brother   . Heart disease Brother   . Hypertension Brother   . Diabetes Brother   . Heart disease Brother 75       Myocardial Infarction   . Heart attack Brother    Social History   Tobacco Use  . Smoking status: Former Smoker    Packs/day: 0.25    Years: 62.00    Pack years: 15.50    Quit date: 04/02/2011    Years since quitting: 9.0  . Smokeless tobacco: Never Used  . Tobacco comment: stopped consistently smoking in 2012 but still smokes occasionally (one pack per week)  Vaping Use  . Vaping Use: Never used  Substance Use Topics  . Alcohol use: No  . Drug use: No   Current Outpatient Medications  Medication Sig Dispense Refill  . ACCU-CHEK AVIVA PLUS test strip USE TO TEST BLOOD SUGAR TWICE DAILY. 100 each 11  . amLODipine (NORVASC) 2.5 MG tablet Take 1 tablet (2.5 mg total) by mouth 2 (two) times daily. 180 tablet 1  . atorvastatin (LIPITOR) 40 MG tablet TAKE 1 TABLET DAILY AT 6 P.M. 90 tablet 3  . citalopram (CELEXA) 10 MG tablet TAKE 1 TABLET DAILY 90 tablet 3  . ELIQUIS 5 MG TABS tablet TAKE 1 TABLET TWICE A DAY 180 tablet 1  . gabapentin (NEURONTIN) 300 MG capsule Take 1 capsule (300 mg total) by mouth 3 (three) times daily. 270 capsule 3  . Lancets (ACCU-CHEK MULTICLIX) lancets Use to test blood sugar twice daily. E11.29 100  each 11  . levocetirizine (XYZAL) 5 MG tablet Take 1 tablet (5 mg total) by mouth every evening. 90 tablet 1  . pioglitazone (ACTOS) 15 MG tablet TAKE 1 TABLET DAILY 90 tablet 3  . triamcinolone cream (KENALOG) 0.5 % Apply 1 application topically 2 (two) times daily. 454 g 1  . vitamin B-12 (CYANOCOBALAMIN) 1000 MCG tablet Take 1,000 mcg by mouth daily.    . Vitamin D, Ergocalciferol, (DRISDOL) 1.25 MG (50000 UNIT) CAPS capsule TAKE 1 CAPSULE ONCE WEEKLY FOR VITAMIN D 12 capsule 3  . hyoscyamine (LEVSIN SL) 0.125 MG SL tablet Place 1 tablet (0.125 mg total) under the tongue every 4 (four) hours as needed. Take tablet every 4-6 hours for abdominal pain and cramping (Patient not taking: Reported on 04/13/2020) 15 tablet 2  . ondansetron (ZOFRAN ODT) 4 MG disintegrating tablet Take 1 tablet (4 mg total)  by mouth every 6 (six) hours. (Patient not taking: Reported on 04/13/2020) 15 tablet 5   No current facility-administered medications for this visit.   Allergies  Allergen Reactions  . Jardiance [Empagliflozin] Diarrhea  . Metformin And Related Diarrhea  . Ceclor [Cefaclor] Other (See Comments)    Unknown reaction; not recalled  . Codeine Nausea And Vomiting  . Lopid [Gemfibrozil] Other (See Comments)    Unknown reaction; not recalled  . Metoprolol Nausea And Vomiting  . Morphine And Related Other (See Comments)    Passed out  . Niacin Nausea And Vomiting    Unknown reaction Unknown reaction; not recalled  . Niacin And Related Other (See Comments)    Unknown reaction; not recalled  . Relafen [Nabumetone] Nausea And Vomiting  . Septra [Sulfamethoxazole-Trimethoprim] Nausea And Vomiting  . Tetracycline Nausea And Vomiting  . Toprol Xl [Metoprolol Tartrate] Nausea And Vomiting  . Adhesive [Tape] Rash and Other (See Comments)    Tears the skin also     Review of Systems: All systems reviewed and negative except where noted in HPI.   Labs reviewed in epic   Physical Exam: BP 120/60  (BP Location: Left Arm, Patient Position: Sitting, Cuff Size: Normal)   Pulse 64   Ht 5' 5.75" (1.67 m)   Wt 171 lb 8 oz (77.8 kg)   BMI 27.89 kg/m  Constitutional: Pleasant,well-developed, female in no acute distress. Abdominal: Soft, nondistended, nontender. . There are no masses palpable. Extremities: no edema Lymphadenopathy: No cervical adenopathy noted. Neurological: Alert and oriented to person place and time. Skin: Skin is warm and dry. No rashes noted. Psychiatric: Normal mood and affect. Behavior is normal.   ASSESSMENT AND PLAN: 87 year old female here for reassessment following:  IDA Rectal bleeding Colonic AVMs Diverticulosis / itis Abdominal pain  Overall have suspected that her iron deficiency was due to combination of large colonic AVM, perhaps with component of inflammatory change from her sigmoid colon given endoscopic appearance, likely related to diverticulosis.  Biopsies of this area showed no concerning changes and CT scan did not show any mass lesions etc.  On IV iron her hemoglobin has responded and improved.  She historically has not really had any overt bleeding until over Christmas she had acute onset abdominal pain with bleeding for 2 days and then resolution on its own.  Her CBC at that time actually looked normal with a normal white blood cell count and hemoglobin.  We discussed differential for that, ischemic colitis possible however I would expect her white cell count to be elevated.  Diverticular related bleeding also possible, or bleeding from the AVM.  We discussed how aggressive she wants to be with management of this.  If she has recurrent anemia that fails to respond to IV iron or recurrent bleeding, we could consider ablation of colonic AVMs at the hospital with APC. However, given size of this AVM (which is large), location (R colon), her age, anticoagulation use, she would be higher than average risk for procedural related bleeding.  Discussed this  issue with the patient and her son.  They agree that they do not want a colonoscopy unless she has progressive worsening of anemia or recurrent bleeding symptoms.  We will need to keep a close eye on this moving forward.  I will repeat a CBC today.  She can continue Levsin as needed for occasional abdominal cramping.  It is okay for her to resume her diet and she can take Citrucel daily to keep her stool  soft.  I will let her know results of CBC.  I will see her in 3 months if not sooner for any issues.  Martin Cellar, MD [facility]

## 2020-04-13 NOTE — Patient Instructions (Addendum)
If you are age 85 or older, your body mass index should be between 23-30. Your Body mass index is 27.89 kg/m. If this is out of the aforementioned range listed, please consider follow up with your Primary Care Provider.  If you are age 46 or younger, your body mass index should be between 19-25. Your Body mass index is 27.89 kg/m. If this is out of the aformentioned range listed, please consider follow up with your Primary Care Provider.   Please go to the lab in the basement of our building to have lab work done as you leave today. Hit "B" for basement when you get on the elevator.  When the doors open the lab is on your left.  We will call you with the results. Thank you.  Due to recent changes in healthcare laws, you may see the results of your imaging and laboratory studies on MyChart before your provider has had a chance to review them.  We understand that in some cases there may be results that are confusing or concerning to you. Not all laboratory results come back in the same time frame and the provider may be waiting for multiple results in order to interpret others.  Please give [place] 48 hours in order for your provider to thoroughly review all the results before contacting the office for clarification of your results.    Continue Levsin.  Resume a normal diet.  Please follow up in 3 months.  Thank you for entrusting me with your care and for choosing [facility], Dr. [place]

## 2020-04-14 ENCOUNTER — Ambulatory Visit: Payer: Medicare Other | Admitting: Podiatry

## 2020-04-24 ENCOUNTER — Telehealth: Payer: Self-pay | Admitting: Internal Medicine

## 2020-04-24 ENCOUNTER — Telehealth: Payer: Self-pay | Admitting: Gastroenterology

## 2020-04-24 ENCOUNTER — Other Ambulatory Visit: Payer: Self-pay | Admitting: Internal Medicine

## 2020-04-24 DIAGNOSIS — I10 Essential (primary) hypertension: Secondary | ICD-10-CM

## 2020-04-24 DIAGNOSIS — D509 Iron deficiency anemia, unspecified: Secondary | ICD-10-CM

## 2020-04-24 MED ORDER — AMLODIPINE BESYLATE 2.5 MG PO TABS: 2.5000 mg | ORAL_TABLET | Freq: Two times a day (BID) | ORAL | 1 refills | Status: DC

## 2020-04-24 NOTE — Telephone Encounter (Signed)
Pt called stating that he has not heard from Oncology yet about an appt. She would like to know if we can help her with that. Pls call her.

## 2020-04-24 NOTE — Telephone Encounter (Signed)
Lm on vm for patient to return call.   Hematology reached out to patient on 02/18/20 and had to leave a vm for patient to return call to schedule. I do not see any other form of contact to patient from their office. Referral is now closed. Will place a new referral to hematology for IV iron infusion.

## 2020-04-24 NOTE — Telephone Encounter (Signed)
1.Medication Requested: amLODipine (NORVASC) 2.5 MG tablet    2. Pharmacy (Name, [place]): [facility], [place]  3. On Med List: yes   4. Last Visit with PCP: 11.4.21  5. Next visit date with PCP: n/a   Patient was wondering if she could get a partial refill sent to [place], [place] while she waits for the delivery pharmacy.    Agent: Please be advised that RX refills may take up to 3 business days. We ask that you follow-up with your pharmacy.

## 2020-04-25 NOTE — Telephone Encounter (Signed)
Lm on vm for patient to return call 

## 2020-04-25 NOTE — Telephone Encounter (Signed)
Spoke with patient, she is aware that [facility] will be calling her to set up another IV iron infusion, patient stated that she feels like she needs an infusion. Patient is an established patient of Dr. Alen Blew and he will advise on scheduling.  Patient verbalized understanding of the information and had no concerns at the end of the call.

## 2020-04-26 ENCOUNTER — Ambulatory Visit: Payer: Medicare Other | Admitting: Podiatry

## 2020-04-28 ENCOUNTER — Other Ambulatory Visit: Payer: Self-pay

## 2020-04-28 ENCOUNTER — Encounter: Payer: Self-pay | Admitting: Podiatry

## 2020-04-28 ENCOUNTER — Ambulatory Visit (INDEPENDENT_AMBULATORY_CARE_PROVIDER_SITE_OTHER): Payer: Medicare Other | Admitting: Podiatry

## 2020-04-28 DIAGNOSIS — E1142 Type 2 diabetes mellitus with diabetic polyneuropathy: Secondary | ICD-10-CM | POA: Diagnosis not present

## 2020-04-28 DIAGNOSIS — M79674 Pain in right toe(s): Secondary | ICD-10-CM

## 2020-04-28 DIAGNOSIS — B351 Tinea unguium: Secondary | ICD-10-CM | POA: Diagnosis not present

## 2020-04-28 DIAGNOSIS — L84 Corns and callosities: Secondary | ICD-10-CM | POA: Diagnosis not present

## 2020-04-28 DIAGNOSIS — M79675 Pain in left toe(s): Secondary | ICD-10-CM

## 2020-04-30 NOTE — Progress Notes (Signed)
Subjective:  Patient ID: Penny Miller, female    DOB: [birth year],  MRN: [id number]  87 y.o. female presents with preventative diabetic foot care and painful thick toenails that are difficult to trim. Pain interferes with ambulation. Aggravating factors include wearing enclosed shoe gear. Pain is relieved with periodic professional debridement.   She has h/o neuropathy and takes gabapentin for her symptoms. She voices no new pedal problems on today's visit.  Patient states her blood glucose was 120 mg/dl this morning.  PCP is [name] and last visit was 11/03/2019.  Review of Systems: Negative except as noted in the HPI.  Past Medical History:  Diagnosis Date  . Allergic rhinitis, cause unspecified   . Anxiety state, unspecified   . Arthritis   . Colon polyp   . Diaphragmatic hernia without mention of obstruction or gangrene   . Diverticulosis of colon (without mention of hemorrhage)   . Female stress incontinence   . HTN (hypertension)   . Insomnia, unspecified   . Interstitial cystitis    surgery 02/01/2013  . Irritable bowel syndrome   . Memory loss   . Other and unspecified hyperlipidemia   . Other premature beats   . Polyneuropathy in diabetes(357.2)   . Postmenopausal bleeding   . Proteinuria   . Rectal ulcer   . Skin cancer   . Stroke ([place]) 2018  . 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 Unspecified essential hypertension   . Viral warts, unspecified    Past Surgical History:  Procedure Laterality Date  . ABDOMINAL HYSTERECTOMY  1977   Dr.Hambright  . APPENDECTOMY  1965  . CATARACT EXTRACTION, BILATERAL  02/10/2006   Dr.Groat   . COLONOSCOPY  07/17/2004   polypectomy  . [place]  . LOOP RECORDER INSERTION N/A 07/31/2016   Procedure: Loop Recorder Insertion;  Surgeon: [name], MD;  Location: [place] [facility];  Service: Cardiovascular;   Laterality: N/A;  . TEE WITHOUT CARDIOVERSION N/A 07/30/2016   Procedure: TRANSESOPHAGEAL ECHOCARDIOGRAM (TEE);  Surgeon: [name], MD;  Location: [facility];  Service: Cardiovascular;  Laterality: N/A;  . [place]  . TRIGGER FINGER RELEASE  05/13/16  . urinary bladder      surgery 02/01/2013 to stretch bladder stem   Patient Active Problem List   Diagnosis Date Noted  . Flu vaccine need 02/03/2020  . Seasonal allergic rhinitis due to pollen 02/03/2020  . Intrinsic eczema 02/03/2020  . Incomplete emptying of bladder 10/01/2019  . Postherpetic neuralgia 08/15/2019  . Acquired hypothyroidism 08/15/2019  . Iron deficiency anemia due to chronic blood loss 08/15/2019  . Lumbar radiculopathy 05/26/2018  . Spinal stenosis of lumbar region with neurogenic claudication 07/21/2017  . Vertigo of central origin 07/21/2017  . Benign hypertension 07/21/2017  . Overweight (BMI 25.0-29.9) 11/07/2016  . Paroxysmal atrial fibrillation ([place]) 09/03/2016  . Diabetic polyneuropathy associated with type 2 diabetes mellitus ([place]) 09/03/2016  . Aneurysm of vertebral artery ([place]) 08/12/2016  . Embolic stroke involving left cerebellar artery ([place]) 07/29/2016  . Chronic kidney disease (CKD), active medical management without dialysis, stage 3 (moderate) ([place]) 05/08/2016  . DM (diabetes mellitus), type 2 with renal complications ([place]) 74/10/1446  . Varicose vein of leg 12/21/2013  . Bladder infection, chronic 01/04/2013  . Polyneuropathy in diabetes(357.2)   . Insomnia, unspecified   . Essential hypertension   . Hyperlipidemia   .  Chronic interstitial cystitis     Current Outpatient Medications:  .  ACCU-CHEK AVIVA PLUS test strip, USE TO TEST BLOOD SUGAR TWICE DAILY., Disp: 100 each, Rfl: 11 .  amLODipine (NORVASC) 2.5 MG tablet, Take 1 tablet (2.5 mg total) by mouth 2 (two) times daily., Disp: 60 tablet, Rfl: 1 .  atorvastatin (LIPITOR) 40 MG tablet, TAKE 1 TABLET  DAILY AT 6 P.M., Disp: 90 tablet, Rfl: 3 .  citalopram (CELEXA) 10 MG tablet, TAKE 1 TABLET DAILY, Disp: 90 tablet, Rfl: 3 .  ELIQUIS 5 MG TABS tablet, TAKE 1 TABLET TWICE A DAY, Disp: 180 tablet, Rfl: 1 .  gabapentin (NEURONTIN) 300 MG capsule, Take 1 capsule (300 mg total) by mouth 3 (three) times daily., Disp: 270 capsule, Rfl: 3 .  hyoscyamine (LEVSIN SL) 0.125 MG SL tablet, Place 1 tablet (0.125 mg total) under the tongue every 4 (four) hours as needed. Take tablet every 4-6 hours for abdominal pain and cramping (Patient not taking: Reported on 04/13/2020), Disp: 15 tablet, Rfl: 2 .  Lancets (ACCU-CHEK MULTICLIX) lancets, Use to test blood sugar twice daily. E11.29, Disp: 100 each, Rfl: 11 .  levocetirizine (XYZAL) 5 MG tablet, Take 1 tablet (5 mg total) by mouth every evening., Disp: 90 tablet, Rfl: 1 .  omeprazole (PRILOSEC) 20 MG capsule, Take 20 mg by mouth 2 (two) times daily., Disp: , Rfl:  .  ondansetron (ZOFRAN ODT) 4 MG disintegrating tablet, Take 1 tablet (4 mg total) by mouth every 6 (six) hours. (Patient not taking: Reported on 04/13/2020), Disp: 15 tablet, Rfl: 5 .  pioglitazone (ACTOS) 15 MG tablet, TAKE 1 TABLET DAILY, Disp: 90 tablet, Rfl: 3 .  triamcinolone cream (KENALOG) 0.5 %, Apply 1 application topically 2 (two) times daily., Disp: 454 g, Rfl: 1 .  vitamin B-12 (CYANOCOBALAMIN) 1000 MCG tablet, Take 1,000 mcg by mouth daily., Disp: , Rfl:  .  Vitamin D, Ergocalciferol, (DRISDOL) 1.25 MG (50000 UNIT) CAPS capsule, TAKE 1 CAPSULE ONCE WEEKLY FOR VITAMIN D, Disp: 12 capsule, Rfl: 3 Allergies  Allergen Reactions  . Jardiance [Empagliflozin] Diarrhea  . Metformin And Related Diarrhea  . Ceclor [Cefaclor] Other (See Comments)    Unknown reaction; not recalled  . Codeine Nausea And Vomiting  . Lopid [Gemfibrozil] Other (See Comments)    Unknown reaction; not recalled  . Metoprolol Nausea And Vomiting  . Morphine And Related Other (See Comments)    Passed out  . Niacin  Nausea And Vomiting    Unknown reaction Unknown reaction; not recalled  . Niacin And Related Other (See Comments)    Unknown reaction; not recalled  . Relafen [Nabumetone] Nausea And Vomiting  . Septra [Sulfamethoxazole-Trimethoprim] Nausea And Vomiting  . Tetracycline Nausea And Vomiting  . Toprol Xl [Metoprolol Tartrate] Nausea And Vomiting  . Adhesive [Tape] Rash and Other (See Comments)    Tears the skin also   Social History   Tobacco Use  Smoking Status Former Smoker  . Packs/day: 0.25  . Years: 62.00  . Pack years: 15.50  . Quit date: 04/02/2011  . Years since quitting: 9.0  Smokeless Tobacco Never Used  Tobacco Comment   stopped consistently smoking in 2012 but still smokes occasionally (one pack per week)    Objective:   Constitutional Pt is a pleasant 87 y.o. Caucasian female, in NAD.  Vascular Neurovascular status unchanged b/l lower extremities. Capillary refill time to digits <4 seconds b/l lower extremities. Palpable pedal pulses b/l LE. Pedal hair absent. Lower extremity skin temperature  gradient within normal limits. No pain with calf compression b/l. No cyanosis or clubbing noted.  Neurologic Normal speech. Oriented to person, place, and time. Pt has subjective symptoms of neuropathy. Protective sensation intact 5/5 intact bilaterally with 10g monofilament b/l. Vibratory sensation intact b/l.  Dermatologic Pedal skin is thin shiny, atrophic b/l lower extremities. No open wounds bilaterally. No interdigital macerations bilaterally. Toenails 1-5 b/l elongated, discolored, dystrophic, thickened, crumbly with subungual debris and tenderness to dorsal palpation. Hyperkeratotic lesion(s) L hallux and R hallux.  No erythema, no edema, no drainage, no flocculence.  Orthopedic: Normal muscle strength 5/5 to all lower extremity muscle groups bilaterally. No pain crepitus or joint limitation noted with ROM b/l. No gross bony deformities bilaterally.   Radiographs:  None Assessment:   1. Pain due to onychomycosis of toenails of both feet   2. Callus   3. Diabetic peripheral neuropathy ([place])    Plan:  Patient was evaluated and treated and all questions answered.  Onychomycosis with pain -Nails palliatively debridement as below. -Educated on self-care  Procedure: Nail Debridement Rationale: Pain Type of Debridement: manual, sharp debridement. Instrumentation: Nail nipper, rotary burr. Number of Nails: 10  -Examined patient. -Continue diabetic foot care principles. -Toenails 1-5 b/l were debrided in length and girth with sterile nail nippers and dremel without iatrogenic bleeding.  -Callus(es) L hallux and R hallux pared utilizing sterile scalpel blade without complication or incident. Total number debrided =2. -Patient to continue soft, supportive shoe gear daily. -Patient/POA to call should there be question/concern in the interim.  Return in about 3 months (around 07/27/2020).  Marzetta Board, DPM

## 2020-05-05 ENCOUNTER — Telehealth: Payer: Self-pay

## 2020-05-05 ENCOUNTER — Other Ambulatory Visit: Payer: Self-pay

## 2020-05-05 ENCOUNTER — Inpatient Hospital Stay: Payer: Medicare Other | Attending: Oncology

## 2020-05-05 ENCOUNTER — Other Ambulatory Visit: Payer: Self-pay | Admitting: Internal Medicine

## 2020-05-05 DIAGNOSIS — D509 Iron deficiency anemia, unspecified: Secondary | ICD-10-CM | POA: Insufficient documentation

## 2020-05-05 DIAGNOSIS — D500 Iron deficiency anemia secondary to blood loss (chronic): Secondary | ICD-10-CM

## 2020-05-05 LAB — CBC WITH DIFFERENTIAL (CANCER CENTER ONLY)
Abs Immature Granulocytes: 0.04 10*3/uL (ref 0.00–0.07)
Basophils Absolute: 0.1 10*3/uL (ref 0.0–0.1)
Basophils Relative: 1 %
Eosinophils Absolute: 0.3 10*3/uL (ref 0.0–0.5)
Eosinophils Relative: 3 %
HCT: 36.2 % (ref 36.0–46.0)
Hemoglobin: 11.4 g/dL — ABNORMAL LOW (ref 12.0–15.0)
Immature Granulocytes: 1 %
Lymphocytes Relative: 11 %
Lymphs Abs: 0.9 10*3/uL (ref 0.7–4.0)
MCH: 31.4 pg (ref 26.0–34.0)
MCHC: 31.5 g/dL (ref 30.0–36.0)
MCV: 99.7 fL (ref 80.0–100.0)
Monocytes Absolute: 0.7 10*3/uL (ref 0.1–1.0)
Monocytes Relative: 8 %
Neutro Abs: 6.7 10*3/uL (ref 1.7–7.7)
Neutrophils Relative %: 76 %
Platelet Count: 203 10*3/uL (ref 150–400)
RBC: 3.63 MIL/uL — ABNORMAL LOW (ref 3.87–5.11)
RDW: 14.3 % (ref 11.5–15.5)
WBC Count: 8.7 10*3/uL (ref 4.0–10.5)
nRBC: 0 % (ref 0.0–0.2)

## 2020-05-05 LAB — IRON AND TIBC
Iron: 42 ug/dL (ref 41–142)
Saturation Ratios: 14 % — ABNORMAL LOW (ref 21–57)
TIBC: 293 ug/dL (ref 236–444)
UIBC: 251 ug/dL (ref 120–384)

## 2020-05-05 LAB — FERRITIN: Ferritin: 41 ng/mL (ref 11–307)

## 2020-05-05 NOTE — Telephone Encounter (Signed)
Patient has been made aware of lab results and verbalized understanding.  

## 2020-05-05 NOTE — Telephone Encounter (Signed)
-----   Message from Wyatt Portela, MD sent at 05/05/2020 12:25 PM EST ----- Please let her know that her labs from today are normal.  Her iron level is adequate and does not require any repeat infusion at this time.  We will updated again in April.

## 2020-05-24 ENCOUNTER — Ambulatory Visit: Payer: Medicare Other | Admitting: Gastroenterology

## 2020-05-30 ENCOUNTER — Telehealth: Payer: Self-pay | Admitting: Internal Medicine

## 2020-05-30 NOTE — Telephone Encounter (Signed)
Did not need this encounter °

## 2020-05-31 ENCOUNTER — Telehealth: Payer: Self-pay | Admitting: *Deleted

## 2020-05-31 NOTE — Telephone Encounter (Signed)
   [facility] Pre-operative Risk Assessment    HEARTCARE STAFF: - Please ensure there is not already an duplicate clearance open for this procedure. - Under Visit Info/Reason for Call, type in Other and utilize the format Clearance MM/DD/YY or Clearance TBD. Do not use dashes or single digits. - If request is for dental extraction, please clarify the # of teeth to be extracted.  Request for surgical clearance: S/W DENTAL OFFICE WHO STATES THEY HAVE NOT SEEN THE PT YET AT THIS TIME. DDS OFFICE STATES THE PT IS HAVING PROBLEMS WITH A CROWN AND MAY NEED TO OPT TO HAVE 1 TOOTH EXTRACTED; AT THIS TIME NO OTHER TX PLANS HAVE BEEN MADE. DDS OFFICE WANTS TO KNOW IF THE PT WILL NEED SBE AS WELL.   1. What type of surgery is being performed? POSSIBLE 1 TOOTH TO BE EXTRACTED   2. When is this surgery scheduled? TBD   3. What type of clearance is required (medical clearance vs. Pharmacy clearance to hold med vs. Both)? BOTH  4. Are there any medications that need to be held prior to surgery and how long? ELIQUIS   5. Practice name and name of physician performing surgery? DR. Moss Mc II, DDS   6. What is the office phone number? [phone]   7.   What is the office fax number? [phone]  8.   Anesthesia type (None, local, MAC, general) ? LOCAL   Julaine Hua 05/31/2020, 12:54 PM  _________________________________________________________________   (provider comments below)

## 2020-05-31 NOTE — Telephone Encounter (Signed)
   Primary Cardiologist: Minus Breeding, MD  Chart reviewed as part of pre-operative protocol coverage.   Simple dental extractions are considered low risk procedures per guidelines and generally do not require any specific cardiac clearance. It is also generally accepted that for simple extractions and dental cleanings, there is no need to interrupt blood thinner therapy.  SBE prophylaxis is not required for the patient from a cardiac standpoint.  I will route this recommendation to the requesting party via Epic fax function and remove from pre-op pool.  Please call with questions.  Kathyrn Drown, NP 05/31/2020, 1:58 PM

## 2020-07-05 ENCOUNTER — Inpatient Hospital Stay: Payer: Medicare Other | Attending: Oncology | Admitting: Oncology

## 2020-07-05 ENCOUNTER — Other Ambulatory Visit: Payer: Self-pay

## 2020-07-05 ENCOUNTER — Inpatient Hospital Stay: Payer: Medicare Other

## 2020-07-05 VITALS — BP 156/63 | HR 59 | Temp 97.0°F | Resp 18 | Ht 65.75 in | Wt 171.3 lb

## 2020-07-05 DIAGNOSIS — K5520 Angiodysplasia of colon without hemorrhage: Secondary | ICD-10-CM | POA: Diagnosis not present

## 2020-07-05 DIAGNOSIS — Z79899 Other long term (current) drug therapy: Secondary | ICD-10-CM | POA: Diagnosis not present

## 2020-07-05 DIAGNOSIS — D509 Iron deficiency anemia, unspecified: Secondary | ICD-10-CM | POA: Diagnosis not present

## 2020-07-05 DIAGNOSIS — D500 Iron deficiency anemia secondary to blood loss (chronic): Secondary | ICD-10-CM | POA: Diagnosis not present

## 2020-07-05 LAB — CBC WITH DIFFERENTIAL (CANCER CENTER ONLY)
Abs Immature Granulocytes: 0.02 10*3/uL (ref 0.00–0.07)
Basophils Absolute: 0.1 10*3/uL (ref 0.0–0.1)
Basophils Relative: 1 %
Eosinophils Absolute: 0.4 10*3/uL (ref 0.0–0.5)
Eosinophils Relative: 5 %
HCT: 36.6 % (ref 36.0–46.0)
Hemoglobin: 12.1 g/dL (ref 12.0–15.0)
Immature Granulocytes: 0 %
Lymphocytes Relative: 12 %
Lymphs Abs: 0.9 10*3/uL (ref 0.7–4.0)
MCH: 31.3 pg (ref 26.0–34.0)
MCHC: 33.1 g/dL (ref 30.0–36.0)
MCV: 94.8 fL (ref 80.0–100.0)
Monocytes Absolute: 0.8 10*3/uL (ref 0.1–1.0)
Monocytes Relative: 10 %
Neutro Abs: 5.7 10*3/uL (ref 1.7–7.7)
Neutrophils Relative %: 72 %
Platelet Count: 164 10*3/uL (ref 150–400)
RBC: 3.86 MIL/uL — ABNORMAL LOW (ref 3.87–5.11)
RDW: 14.6 % (ref 11.5–15.5)
WBC Count: 8 10*3/uL (ref 4.0–10.5)
nRBC: 0 % (ref 0.0–0.2)

## 2020-07-05 LAB — FERRITIN: Ferritin: 19 ng/mL (ref 11–307)

## 2020-07-05 LAB — IRON AND TIBC
Iron: 57 ug/dL (ref 41–142)
Saturation Ratios: 17 % — ABNORMAL LOW (ref 21–57)
TIBC: 338 ug/dL (ref 236–444)
UIBC: 281 ug/dL (ref 120–384)

## 2020-07-05 NOTE — Progress Notes (Signed)
Patient reports that she is having urinary frequency, pain and burning for the last two weeks.  Dr. Alen Blew informed, no orders received.  Pt advised to contact her PCP for appointment regarding this issue.  Patient & her son verbalize understanding.

## 2020-07-05 NOTE — Progress Notes (Signed)
Hematology and Oncology Follow Up Visit  Penny Miller [id number] 18-Dec-1931 87 y.o. 07/05/2020 9:47 AM Penny Miller Penny Miller, MDJones, Penny Right, MD   Principle Diagnosis: 87 year old woman with iron deficiency anemia diagnosed in August 2020.  She presented with a hemoglobin of 9.5 and ferritin of 13.  Her iron deficiency is related to poor iron absorption and possible GI blood losses related to angiodysplasia.   Prior Therapy:  He is status post IV iron infusion in August 2020 with Feraheme.  This was repeated in December 2021.    Current therapy: Oral iron supplement and repeat iron infusion as needed.  Interim History: Penny Miller returns today for a follow-up visit.   He reports feeling well without any major complaints.  She tolerated intravenous iron infusion in December with improvement in her performance status and activity level.  He denies any excessive fatigue tiredness weakness.  She denies any hematochezia melena or hemoptysis.    Medications: I have reviewed the patient's current medications.  Current Outpatient Medications  Medication Sig Dispense Refill  . ACCU-CHEK AVIVA PLUS test strip USE TO TEST BLOOD SUGAR TWICE DAILY. 100 each 11  . amLODipine (NORVASC) 2.5 MG tablet Take 1 tablet (2.5 mg total) by mouth 2 (two) times daily. 60 tablet 1  . atorvastatin (LIPITOR) 40 MG tablet TAKE 1 TABLET DAILY AT 6 P.M. 90 tablet 3  . citalopram (CELEXA) 10 MG tablet TAKE 1 TABLET DAILY 90 tablet 3  . ELIQUIS 5 MG TABS tablet TAKE 1 TABLET TWICE A DAY 180 tablet 1  . gabapentin (NEURONTIN) 300 MG capsule Take 1 capsule (300 mg total) by mouth 3 (three) times daily. 270 capsule 3  . hyoscyamine (LEVSIN SL) 0.125 MG SL tablet Place 1 tablet (0.125 mg total) under the tongue every 4 (four) hours as needed. Take tablet every 4-6 hours for abdominal pain and cramping (Patient not taking: Reported on 04/13/2020) 15 tablet 2  . Lancets (ACCU-CHEK MULTICLIX) lancets Use to test blood sugar twice  daily. E11.29 100 each 11  . levocetirizine (XYZAL) 5 MG tablet Take 1 tablet (5 mg total) by mouth every evening. 90 tablet 1  . omeprazole (PRILOSEC) 20 MG capsule Take 20 mg by mouth 2 (two) times daily.    . ondansetron (ZOFRAN ODT) 4 MG disintegrating tablet Take 1 tablet (4 mg total) by mouth every 6 (six) hours. (Patient not taking: Reported on 04/13/2020) 15 tablet 5  . pioglitazone (ACTOS) 15 MG tablet TAKE 1 TABLET DAILY 90 tablet 3  . triamcinolone cream (KENALOG) 0.5 % Apply 1 application topically 2 (two) times daily. 454 g 1  . vitamin B-12 (CYANOCOBALAMIN) 1000 MCG tablet Take 1,000 mcg by mouth daily.    . Vitamin D, Ergocalciferol, (DRISDOL) 1.25 MG (50000 UNIT) CAPS capsule TAKE 1 CAPSULE ONCE WEEKLY FOR VITAMIN D 12 capsule 3   No current facility-administered medications for this visit.     Allergies:  Allergies  Allergen Reactions  . Jardiance [Empagliflozin] Diarrhea  . Metformin And Related Diarrhea  . Ceclor [Cefaclor] Other (See Comments)    Unknown reaction; not recalled  . Codeine Nausea And Vomiting  . Lopid [Gemfibrozil] Other (See Comments)    Unknown reaction; not recalled  . Metoprolol Nausea And Vomiting  . Morphine And Related Other (See Comments)    Passed out  . Niacin Nausea And Vomiting    Unknown reaction Unknown reaction; not recalled  . Niacin And Related Other (See Comments)    Unknown reaction; not  recalled  . Relafen [Nabumetone] Nausea And Vomiting  . Septra [Sulfamethoxazole-Trimethoprim] Nausea And Vomiting  . Tetracycline Nausea And Vomiting  . Toprol Xl [Metoprolol Tartrate] Nausea And Vomiting  . Adhesive [Tape] Rash and Other (See Comments)    Tears the skin also     Physical Exam: Blood pressure (!) 156/63, pulse (!) 59, temperature (!) 97 F (36.1 C), temperature source Tympanic, resp. rate 18, height 5' 5.75" (1.67 m), weight 171 lb 4.8 oz (77.7 kg).   ECOG: 1    General appearance: Comfortable appearing without any  discomfort Head: Normocephalic without any trauma Oropharynx: Mucous membranes are moist and pink without any thrush or ulcers. Eyes: Pupils are equal and round reactive to light. Lymph nodes: No cervical, supraclavicular, inguinal or axillary lymphadenopathy.   Heart:regular rate and rhythm.  S1 and S2 without leg edema. Lung: Clear without any rhonchi or wheezes.  No dullness to percussion. Abdomin: Soft, nontender, nondistended with good bowel sounds.  No hepatosplenomegaly. Musculoskeletal: No joint deformity or effusion.  Full range of motion noted. Neurological: No deficits noted on motor, sensory and deep tendon reflex exam. Skin: No petechial rash or dryness.  Appeared moist.      Lab Results: Lab Results  Component Value Date   WBC 8.7 05/05/2020   HGB 11.4 (L) 05/05/2020   HCT 36.2 05/05/2020   MCV 99.7 05/05/2020   PLT 203 05/05/2020     Chemistry      Component Value Date/Time   NA 139 03/23/2020 0913   NA 136 11/11/2018 0846   K 4.1 03/23/2020 0913   CL 103 03/23/2020 0913   CO2 29 03/23/2020 0913   BUN 19 03/23/2020 0913   BUN 25 11/11/2018 0846   CREATININE 1.22 (H) 03/23/2020 0913   CREATININE 1.42 (H) 08/09/2019 0903      Component Value Date/Time   CALCIUM 9.8 03/23/2020 0913   ALKPHOS 73 03/23/2020 0913   AST 14 03/23/2020 0913   ALT 10 03/23/2020 0913   BILITOT 0.4 03/23/2020 0913   BILITOT 0.3 06/23/2015 0927          Impression and Plan:   87 year old with:  1.  Iron deficiency anemia related to chronic GI blood losses and poor iron absorption diagnosed in August 2020.    She is status post repeat intravenous iron completed in December 2021 after she presented with ferritin of 18 and saturation of 14%.  Repeat intravenous iron in February 2022 showed improvement in her counts and these tests are repeated today.  Management options moving forward were discussed.  Continued oral iron replacement versus repeat intravenous iron infusion  options were discussed.  Complication related to intravenous iron infusion including arthralgias, myalgias and infusion related complaints.  Dyspepsia, nausea constipation are associated with oral iron replacement.  Laboratory data from today reviewed which showed normalization of her hemoglobin currently at 12.1 with iron studies are currently pending.  For the time being, I recommended continued monitoring and replace iron as needed.  She will continue oral iron supplement as she is doing.   2.  Angiodysplasia of the colon: No active bleeding noted.  She continues to follow with GI regarding this issue.   3.  Follow-up: She will return in 6 months for a follow-up visit.  30  minutes were spent on this encounter.  The time was dedicated to reviewing laboratory data, treatment options and complications related to therapy.   Zola Button, MD 4/6/20229:47 AM

## 2020-07-11 DIAGNOSIS — N3010 Interstitial cystitis (chronic) without hematuria: Secondary | ICD-10-CM | POA: Diagnosis not present

## 2020-07-19 ENCOUNTER — Ambulatory Visit (INDEPENDENT_AMBULATORY_CARE_PROVIDER_SITE_OTHER): Payer: Medicare Other | Admitting: Internal Medicine

## 2020-07-19 ENCOUNTER — Other Ambulatory Visit: Payer: Self-pay

## 2020-07-19 ENCOUNTER — Telehealth: Payer: Self-pay | Admitting: Internal Medicine

## 2020-07-19 ENCOUNTER — Encounter: Payer: Self-pay | Admitting: Internal Medicine

## 2020-07-19 VITALS — BP 130/68 | HR 68 | Temp 98.2°F | Ht 65.75 in | Wt 169.0 lb

## 2020-07-19 DIAGNOSIS — N183 Chronic kidney disease, stage 3 unspecified: Secondary | ICD-10-CM

## 2020-07-19 DIAGNOSIS — E039 Hypothyroidism, unspecified: Secondary | ICD-10-CM | POA: Diagnosis not present

## 2020-07-19 DIAGNOSIS — N184 Chronic kidney disease, stage 4 (severe): Secondary | ICD-10-CM | POA: Diagnosis not present

## 2020-07-19 DIAGNOSIS — I10 Essential (primary) hypertension: Secondary | ICD-10-CM

## 2020-07-19 DIAGNOSIS — E1142 Type 2 diabetes mellitus with diabetic polyneuropathy: Secondary | ICD-10-CM | POA: Diagnosis not present

## 2020-07-19 DIAGNOSIS — E1122 Type 2 diabetes mellitus with diabetic chronic kidney disease: Secondary | ICD-10-CM | POA: Diagnosis not present

## 2020-07-19 DIAGNOSIS — N3000 Acute cystitis without hematuria: Secondary | ICD-10-CM | POA: Diagnosis not present

## 2020-07-19 DIAGNOSIS — N1831 Chronic kidney disease, stage 3a: Secondary | ICD-10-CM | POA: Diagnosis not present

## 2020-07-19 LAB — CBC WITH DIFFERENTIAL/PLATELET
Basophils Absolute: 0.1 10*3/uL (ref 0.0–0.1)
Basophils Relative: 0.6 % (ref 0.0–3.0)
Eosinophils Absolute: 0.4 10*3/uL (ref 0.0–0.7)
Eosinophils Relative: 4.1 % (ref 0.0–5.0)
HCT: 36.2 % (ref 36.0–46.0)
Hemoglobin: 12.1 g/dL (ref 12.0–15.0)
Lymphocytes Relative: 11.8 % — ABNORMAL LOW (ref 12.0–46.0)
Lymphs Abs: 1.1 10*3/uL (ref 0.7–4.0)
MCHC: 33.4 g/dL (ref 30.0–36.0)
MCV: 93.7 fl (ref 78.0–100.0)
Monocytes Absolute: 1 10*3/uL (ref 0.1–1.0)
Monocytes Relative: 10.6 % (ref 3.0–12.0)
Neutro Abs: 6.5 10*3/uL (ref 1.4–7.7)
Neutrophils Relative %: 72.9 % (ref 43.0–77.0)
Platelets: 167 10*3/uL (ref 150.0–400.0)
RBC: 3.87 Mil/uL (ref 3.87–5.11)
RDW: 15.1 % (ref 11.5–15.5)
WBC: 8.9 10*3/uL (ref 4.0–10.5)

## 2020-07-19 LAB — URINALYSIS, ROUTINE W REFLEX MICROSCOPIC
Bilirubin Urine: NEGATIVE
Ketones, ur: NEGATIVE
Nitrite: NEGATIVE
Specific Gravity, Urine: 1.015 (ref 1.000–1.030)
Total Protein, Urine: NEGATIVE
Urine Glucose: NEGATIVE
Urobilinogen, UA: 0.2 (ref 0.0–1.0)
pH: 6 (ref 5.0–8.0)

## 2020-07-19 LAB — HEMOGLOBIN A1C: Hgb A1c MFr Bld: 6.7 % — ABNORMAL HIGH (ref 4.6–6.5)

## 2020-07-19 LAB — BASIC METABOLIC PANEL
BUN: 28 mg/dL — ABNORMAL HIGH (ref 6–23)
CO2: 27 mEq/L (ref 19–32)
Calcium: 9.5 mg/dL (ref 8.4–10.5)
Chloride: 104 mEq/L (ref 96–112)
Creatinine, Ser: 1.64 mg/dL — ABNORMAL HIGH (ref 0.40–1.20)
GFR: 28.11 mL/min — ABNORMAL LOW (ref >60.00)
Glucose, Bld: 99 mg/dL (ref 70–99)
Potassium: 4.2 mEq/L (ref 3.5–5.1)
Sodium: 138 mEq/L (ref 135–145)

## 2020-07-19 LAB — TSH: TSH: 4 u[IU]/mL (ref 0.35–4.50)

## 2020-07-19 MED ORDER — SULFAMETHOXAZOLE-TRIMETHOPRIM 800-160 MG PO TABS: 1.0000 | ORAL_TABLET | Freq: Two times a day (BID) | ORAL | 0 refills | Status: AC

## 2020-07-19 NOTE — Telephone Encounter (Signed)
Pharmacist has been informed

## 2020-07-19 NOTE — Progress Notes (Signed)
Subjective:  Patient ID: Penny Miller, female    DOB: November 10, 1931  Age: 87 y.o. MRN: [id number]  CC: Urinary Tract Infection, Hypothyroidism, and Hypertension  This visit occurred during the SARS-CoV-2 public health emergency.  Safety protocols were in place, including screening questions prior to the visit, additional usage of staff PPE, and extensive cleaning of exam room while observing appropriate contact time as indicated for disinfecting solutions.    HPI SWANNIE MILIUS presents for f/up -    She complains of a 1 week history of dysuria, urgency, and bladder pain.  She saw someone else about a week ago and has been taking nitrofurantoin without any improvement.  She does not know if a culture was done.  She denies nausea, vomiting, fever, or chills.  Outpatient Medications Prior to Visit  Medication Sig Dispense Refill  . ACCU-CHEK AVIVA PLUS test strip USE TO TEST BLOOD SUGAR TWICE DAILY. 100 each 11  . amLODipine (NORVASC) 2.5 MG tablet Take 1 tablet (2.5 mg total) by mouth 2 (two) times daily. 60 tablet 1  . atorvastatin (LIPITOR) 40 MG tablet TAKE 1 TABLET DAILY AT 6 P.M. 90 tablet 3  . citalopram (CELEXA) 10 MG tablet TAKE 1 TABLET DAILY 90 tablet 3  . ELIQUIS 5 MG TABS tablet TAKE 1 TABLET TWICE A DAY 180 tablet 1  . gabapentin (NEURONTIN) 300 MG capsule Take 1 capsule (300 mg total) by mouth 3 (three) times daily. 270 capsule 3  . hyoscyamine (LEVSIN SL) 0.125 MG SL tablet Place 1 tablet (0.125 mg total) under the tongue every 4 (four) hours as needed. Take tablet every 4-6 hours for abdominal pain and cramping 15 tablet 2  . Lancets (ACCU-CHEK MULTICLIX) lancets Use to test blood sugar twice daily. E11.29 100 each 11  . nitrofurantoin (MACRODANTIN) 100 MG capsule Take 100 mg by mouth 4 (four) times daily.    . ondansetron (ZOFRAN ODT) 4 MG disintegrating tablet Take 1 tablet (4 mg total) by mouth every 6 (six) hours. 15 tablet 5  . pioglitazone (ACTOS) 15 MG tablet TAKE 1  TABLET DAILY 90 tablet 3  . triamcinolone cream (KENALOG) 0.5 % Apply 1 application topically 2 (two) times daily. 454 g 1  . vitamin B-12 (CYANOCOBALAMIN) 1000 MCG tablet Take 1,000 mcg by mouth daily.    . Vitamin D, Ergocalciferol, (DRISDOL) 1.25 MG (50000 UNIT) CAPS capsule TAKE 1 CAPSULE ONCE WEEKLY FOR VITAMIN D 12 capsule 3  . levocetirizine (XYZAL) 5 MG tablet Take 1 tablet (5 mg total) by mouth every evening. 90 tablet 1  . omeprazole (PRILOSEC) 20 MG capsule Take 20 mg by mouth 2 (two) times daily.     No facility-administered medications prior to visit.    ROS Review of Systems  Constitutional: Negative for chills, diaphoresis, fatigue and fever.  HENT: Negative.   Eyes: Negative.   Respiratory: Negative for cough, chest tightness, shortness of breath and wheezing.   Cardiovascular: Negative for chest pain, palpitations and leg swelling.  Gastrointestinal: Negative for abdominal pain, constipation, diarrhea, nausea and vomiting.  Endocrine: Negative.   Genitourinary: Positive for dysuria, frequency and urgency. Negative for decreased urine volume, difficulty urinating and hematuria.  Musculoskeletal: Negative.   Skin: Negative.  Negative for color change and pallor.  Neurological: Negative.  Negative for dizziness and light-headedness.  Hematological: Negative for adenopathy. Does not bruise/bleed easily.  Psychiatric/Behavioral: Negative.     Objective:  BP 130/68   Pulse 68   Temp 98.2 F (36.8 C) (  Oral)   Ht 5' 5.75" (1.67 m)   Wt 169 lb (76.7 kg)   SpO2 94%   BMI 27.49 kg/m   BP Readings from Last 3 Encounters:  07/19/20 130/68  07/05/20 (!) 156/63  04/13/20 120/60    Wt Readings from Last 3 Encounters:  07/19/20 169 lb (76.7 kg)  07/05/20 171 lb 4.8 oz (77.7 kg)  04/13/20 171 lb 8 oz (77.8 kg)    Physical Exam Vitals reviewed.  Constitutional:      General: She is not in acute distress.    Appearance: She is not ill-appearing, toxic-appearing or  diaphoretic.  HENT:     Nose: Nose normal.     Mouth/Throat:     Mouth: Mucous membranes are moist.     Pharynx: Oropharynx is clear.  Eyes:     General: No scleral icterus.    Conjunctiva/sclera: Conjunctivae normal.  Cardiovascular:     Rate and Rhythm: Normal rate and regular rhythm.     Heart sounds: No murmur heard.   Pulmonary:     Effort: Pulmonary effort is normal.     Breath sounds: No stridor. No wheezing, rhonchi or rales.  Abdominal:     General: Abdomen is protuberant. Bowel sounds are normal. There is no distension.     Palpations: Abdomen is soft. There is no hepatomegaly, splenomegaly or mass.  Musculoskeletal:        General: Normal range of motion.     Cervical back: Neck supple.     Right lower leg: No edema.     Left lower leg: No edema.  Lymphadenopathy:     Cervical: No cervical adenopathy.  Skin:    General: Skin is warm and dry.     Coloration: Skin is not pale.  Neurological:     General: No focal deficit present.     Mental Status: She is alert.  Psychiatric:        Mood and Affect: Mood normal.        Behavior: Behavior normal.     Lab Results  Component Value Date   WBC 8.9 07/19/2020   HGB 12.1 07/19/2020   HCT 36.2 07/19/2020   PLT 167.0 07/19/2020   GLUCOSE 99 07/19/2020   CHOL 160 08/09/2019   TRIG 161 (H) 08/09/2019   HDL 51 08/09/2019   LDLCALC 83 08/09/2019   ALT 10 03/23/2020   AST 14 03/23/2020   NA 138 07/19/2020   K 4.2 07/19/2020   CL 104 07/19/2020   CREATININE 1.64 (H) 07/19/2020   BUN 28 (H) 07/19/2020   CO2 27 07/19/2020   TSH 4.00 07/19/2020   INR 1.06 07/08/2016   HGBA1C 6.7 (H) 07/19/2020   MICROALBUR 9.8 08/09/2019    No results found.  Assessment & Plan:   Annaliza was seen today for urinary tract infection, hypothyroidism and hypertension.  Diagnoses and all orders for this visit:  Benign hypertension- Her blood pressure is adequately well controlled. -     CBC with Differential/Platelet;  Future -     Basic metabolic panel; Future -     TSH; Future -     Urinalysis, Routine w reflex microscopic; Future -     Urinalysis, Routine w reflex microscopic -     TSH -     Basic metabolic panel -     CBC with Differential/Platelet  Acquired hypothyroidism- Her TSH is in the normal range.  TRT is not indicated.. -     TSH; Future -  TSH  Diabetic polyneuropathy associated with type 2 diabetes mellitus ([place])- Her blood sugar is adequately well controlled. -     Hemoglobin A1c; Future -     Hemoglobin A1c  Type 2 diabetes mellitus with stage 3a chronic kidney disease, without long-term current use of insulin (HCC) -     Basic metabolic panel; Future -     Hemoglobin A1c; Future -     Hemoglobin A1c -     Basic metabolic panel  Chronic kidney disease (CKD), active medical management without dialysis, stage 3 (moderate) ([place])- Her renal function has declined. I recommend that she see nephrology. -     CBC with Differential/Platelet; Future -     Basic metabolic panel; Future -     Urinalysis, Routine w reflex microscopic; Future -     Urinalysis, Routine w reflex microscopic -     Basic metabolic panel -     CBC with Differential/Platelet  Acute cystitis without hematuria- Her urine culture is negative. -     CULTURE, URINE COMPREHENSIVE; Future -     sulfamethoxazole-trimethoprim (BACTRIM DS) 800-160 MG tablet; Take 1 tablet by mouth 2 (two) times daily for 5 days. -     CULTURE, URINE COMPREHENSIVE   I am having Donyea P. Benton start on sulfamethoxazole-trimethoprim. I am also having her maintain her accu-chek multiclix, Accu-Chek Aviva Plus, gabapentin, Vitamin D (Ergocalciferol), citalopram, pioglitazone, atorvastatin, vitamin B-12, triamcinolone cream, Eliquis, hyoscyamine, ondansetron, amLODipine, and nitrofurantoin.  Meds ordered this encounter  Medications  . sulfamethoxazole-trimethoprim (BACTRIM DS) 800-160 MG tablet    Sig: Take 1 tablet by mouth 2 (two)  times daily for 5 days.    Dispense:  10 tablet    Refill:  0     Follow-up: Return in about 3 weeks (around 08/09/2020).  Scarlette Calico, MD

## 2020-07-19 NOTE — Telephone Encounter (Signed)
sulfamethoxazole-trimethoprim (BACTRIM DS) 800-160 MG tablet Pharmacy calling, states they have this drug as an allergy for the patient so they wanted to call and confirm to see if they should fill this

## 2020-07-19 NOTE — Patient Instructions (Signed)
Urinary Tract Infection, Adult  A urinary tract infection (UTI) is an infection of any part of the urinary tract. The urinary tract includes the kidneys, ureters, bladder, and urethra. These organs make, store, and get rid of urine in the body. An upper UTI affects the ureters and kidneys. A lower UTI affects the bladder and urethra. What are the causes? Most urinary tract infections are caused by bacteria in your genital area around your urethra, where urine leaves your body. These bacteria grow and cause inflammation of your urinary tract. What increases the risk? You are more likely to develop this condition if:  You have a urinary catheter that stays in place.  You are not able to control when you urinate or have a bowel movement (incontinence).  You are female and you: ? Use a spermicide or diaphragm for birth control. ? Have low estrogen levels. ? Are pregnant.  You have certain genes that increase your risk.  You are sexually active.  You take antibiotic medicines.  You have a condition that causes your flow of urine to slow down, such as: ? An enlarged prostate, if you are female. ? Blockage in your urethra. ? A kidney stone. ? A nerve condition that affects your bladder control (neurogenic bladder). ? Not getting enough to drink, or not urinating often.  You have certain medical conditions, such as: ? Diabetes. ? A weak disease-fighting system (immunesystem). ? Sickle cell disease. ? Gout. ? Spinal cord injury. What are the signs or symptoms? Symptoms of this condition include:  Needing to urinate right away (urgency).  Frequent urination. This may include small amounts of urine each time you urinate.  Pain or burning with urination.  Blood in the urine.  Urine that smells bad or unusual.  Trouble urinating.  Cloudy urine.  Vaginal discharge, if you are female.  Pain in the abdomen or the lower back. You may also have:  Vomiting or a decreased  appetite.  Confusion.  Irritability or tiredness.  A fever or chills.  Diarrhea. The first symptom in older adults may be confusion. In some cases, they may not have any symptoms until the infection has worsened. How is this diagnosed? This condition is diagnosed based on your medical history and a physical exam. You may also have other tests, including:  Urine tests.  Blood tests.  Tests for STIs (sexually transmitted infections). If you have had more than one UTI, a cystoscopy or imaging studies may be done to determine the cause of the infections. How is this treated? Treatment for this condition includes:  Antibiotic medicine.  Over-the-counter medicines to treat discomfort.  Drinking enough water to stay hydrated. If you have frequent infections or have other conditions such as a kidney stone, you may need to see a health care provider who specializes in the urinary tract (urologist). In rare cases, urinary tract infections can cause sepsis. Sepsis is a life-threatening condition that occurs when the body responds to an infection. Sepsis is treated in the hospital with IV antibiotics, fluids, and other medicines. Follow these instructions at home: Medicines  Take over-the-counter and prescription medicines only as told by your health care provider.  If you were prescribed an antibiotic medicine, take it as told by your health care provider. Do not stop using the antibiotic even if you start to feel better. General instructions  Make sure you: ? Empty your bladder often and completely. Do not hold urine for long periods of time. ? Empty your bladder after   sex. ? Wipe from front to back after urinating or having a bowel movement if you are female. Use each tissue only one time when you wipe.  Drink enough fluid to keep your urine pale yellow.  Keep all follow-up visits. This is important.   Contact a health care provider if:  Your symptoms do not get better after 1-2  days.  Your symptoms go away and then return. Get help right away if:  You have severe pain in your back or your lower abdomen.  You have a fever or chills.  You have nausea or vomiting. Summary  A urinary tract infection (UTI) is an infection of any part of the urinary tract, which includes the kidneys, ureters, bladder, and urethra.  Most urinary tract infections are caused by bacteria in your genital area.  Treatment for this condition often includes antibiotic medicines.  If you were prescribed an antibiotic medicine, take it as told by your health care provider. Do not stop using the antibiotic even if you start to feel better.  Keep all follow-up visits. This is important. This information is not intended to replace advice given to you by your health care provider. Make sure you discuss any questions you have with your health care provider. Document Revised: 10/29/2019 Document Reviewed: 10/29/2019 Elsevier Patient Education  2021 [employer]  

## 2020-07-19 NOTE — Telephone Encounter (Signed)
It is only N/V

## 2020-07-20 ENCOUNTER — Other Ambulatory Visit: Payer: Self-pay | Admitting: Internal Medicine

## 2020-07-20 ENCOUNTER — Other Ambulatory Visit: Payer: Self-pay | Admitting: Gastroenterology

## 2020-07-20 DIAGNOSIS — L2084 Intrinsic (allergic) eczema: Secondary | ICD-10-CM

## 2020-07-20 DIAGNOSIS — J301 Allergic rhinitis due to pollen: Secondary | ICD-10-CM

## 2020-07-21 LAB — CULTURE, URINE COMPREHENSIVE: RESULT:: NO GROWTH

## 2020-07-22 DIAGNOSIS — N184 Chronic kidney disease, stage 4 (severe): Secondary | ICD-10-CM | POA: Insufficient documentation

## 2020-07-28 ENCOUNTER — Ambulatory Visit: Payer: Self-pay | Admitting: Family

## 2020-08-11 ENCOUNTER — Other Ambulatory Visit: Payer: Self-pay

## 2020-08-11 ENCOUNTER — Encounter: Payer: Self-pay | Admitting: Podiatry

## 2020-08-11 ENCOUNTER — Ambulatory Visit (INDEPENDENT_AMBULATORY_CARE_PROVIDER_SITE_OTHER): Payer: Medicare Other | Admitting: Podiatry

## 2020-08-11 DIAGNOSIS — E1142 Type 2 diabetes mellitus with diabetic polyneuropathy: Secondary | ICD-10-CM

## 2020-08-11 DIAGNOSIS — M79674 Pain in right toe(s): Secondary | ICD-10-CM | POA: Diagnosis not present

## 2020-08-11 DIAGNOSIS — M79675 Pain in left toe(s): Secondary | ICD-10-CM | POA: Diagnosis not present

## 2020-08-11 DIAGNOSIS — L84 Corns and callosities: Secondary | ICD-10-CM

## 2020-08-11 DIAGNOSIS — B351 Tinea unguium: Secondary | ICD-10-CM | POA: Diagnosis not present

## 2020-08-17 NOTE — Progress Notes (Signed)
  Subjective:  Patient ID: Penny Miller, female    DOB: 09-May-1931,  MRN: [id number]  87 y.o. female presents at risk foot care with history of diabetic neuropathy and callus(es) b/l hallux and painful thick toenails that are difficult to trim. Painful toenails interfere with ambulation. Aggravating factors include wearing enclosed shoe gear. Pain is relieved with periodic professional debridement. Painful calluses are aggravated when weightbearing with and without shoegear. Pain is relieved with periodic professional debridement.   She states her blood glucose was 120 mg/dl on yesterday.  PCP is [name] and last visit was 07/19/2020.  Allergies  Allergen Reactions  . Jardiance [Empagliflozin] Diarrhea  . Metformin And Related Diarrhea  . Ceclor [Cefaclor] Other (See Comments)    Unknown reaction; not recalled  . Codeine Nausea And Vomiting  . Lopid [Gemfibrozil] Other (See Comments)    Unknown reaction; not recalled  . Metoprolol Nausea And Vomiting  . Morphine And Related Other (See Comments)    Passed out  . Niacin Nausea And Vomiting    Unknown reaction Unknown reaction; not recalled  . Niacin And Related Other (See Comments)    Unknown reaction; not recalled  . Relafen [Nabumetone] Nausea And Vomiting  . Septra [Sulfamethoxazole-Trimethoprim] Nausea And Vomiting  . Tetracycline Nausea And Vomiting  . Toprol Xl [Metoprolol Tartrate] Nausea And Vomiting  . Adhesive [Tape] Rash and Other (See Comments)    Tears the skin also    Review of Systems: Negative except as noted in the HPI.   Objective:   Constitutional Pt is a pleasant 87 y.o. Caucasian female in NAD. AAO x 3.   Vascular Capillary refill time to digits <4 seconds b/l lower extremities. Palpable pedal pulses b/l LE. Pedal hair absent. Lower extremity skin temperature gradient within normal limits. No pain with calf compression b/l.  Neurologic Pt has subjective symptoms of neuropathy. Protective sensation  intact 5/5 intact bilaterally with 10g monofilament b/l.  Dermatologic Pedal skin with normal turgor, texture and tone bilaterally. No open wounds bilaterally. No interdigital macerations bilaterally. Toenails 1-5 b/l elongated, discolored, dystrophic, thickened, crumbly with subungual debris and tenderness to dorsal palpation. Hyperkeratotic lesion(s) L hallux and R hallux.  No erythema, no edema, no drainage, no fluctuance.  Orthopedic: Normal muscle strength 5/5 to all lower extremity muscle groups bilaterally. No pain crepitus or joint limitation noted with ROM b/l. No gross bony deformities bilaterally.   Radiographs: None Assessment:   1. Pain due to onychomycosis of toenails of both feet   2. Callus   3. Diabetic peripheral neuropathy ([employer])    Plan:  Patient was evaluated and treated and all questions answered.  Onychomycosis with pain -Nails palliatively debridement as below. -Educated on self-care  Procedure: Nail Debridement Rationale: Pain Type of Debridement: manual, sharp debridement. Instrumentation: Nail nipper, rotary burr. Number of Nails: 10  -Examined patient. -No new findings. No new orders. -Continue diabetic foot care principles. -Patient to continue soft, supportive shoe gear daily. -Toenails 1-5 b/l were debrided in length and girth with sterile nail nippers and dremel without iatrogenic bleeding.  -Callus(es) L hallux and R hallux pared utilizing sterile scalpel blade without complication or incident. Total number debrided =2. -Patient to report any pedal injuries to medical professional immediately. -Patient/POA to call should there be question/concern in the interim.  Return in about 3 months (around 11/11/2020).  Marzetta Board, DPM

## 2020-08-22 ENCOUNTER — Encounter: Payer: Medicare Other | Admitting: Internal Medicine

## 2020-08-30 ENCOUNTER — Other Ambulatory Visit: Payer: Self-pay

## 2020-08-30 ENCOUNTER — Other Ambulatory Visit: Payer: Self-pay | Admitting: Internal Medicine

## 2020-08-30 ENCOUNTER — Ambulatory Visit (INDEPENDENT_AMBULATORY_CARE_PROVIDER_SITE_OTHER): Payer: Medicare Other

## 2020-08-30 VITALS — BP 120/78 | HR 58 | Temp 97.9°F | Ht 66.0 in | Wt 166.4 lb

## 2020-08-30 DIAGNOSIS — E1142 Type 2 diabetes mellitus with diabetic polyneuropathy: Secondary | ICD-10-CM

## 2020-08-30 DIAGNOSIS — M5416 Radiculopathy, lumbar region: Secondary | ICD-10-CM

## 2020-08-30 DIAGNOSIS — Z0000 Encounter for general adult medical examination without abnormal findings: Secondary | ICD-10-CM | POA: Diagnosis not present

## 2020-08-30 MED ORDER — GABAPENTIN 300 MG PO CAPS: 300.0000 mg | ORAL_CAPSULE | Freq: Three times a day (TID) | ORAL | 0 refills | Status: DC

## 2020-08-30 NOTE — Progress Notes (Signed)
Cardiology Office Note Date:  08/30/2020  Patient ID:  Glorianne, Proctor Jul 11, 1931, MRN [id number] PCP:  [name], MD  Cardiologist:  Dr. Percival Spanish (2017) Electrophysiologist: [name]    Chief Complaint: over due visit  History of Present Illness: RHIANNON SASSAMAN is a 87 y.o. 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  She comes in today to be seen for [name], last seen by him Jan 2021, discussed noted sinus pauses without syncope > atenolol stopped. She was doing OK, no changes were made.  TODAY She comes accompanied by her son. She was doing very well until her PMD team stopped her gabapentin. She started to feel awful and subsequently resumed and doing better.  Her son mentions a fainting spell she had, back in Nov-Dec time, was coming back from the doctor, recently hospitalized with GIB and as she got to the from door up the steps she fainted, this was brief and without warning, woke quickly did not fall, he was with her, no injury. Has not happened again. She has a long hx of feeling lightheaded especially when she bends forward/down, and infrequently will get a little lightheaded though this has warning and fades off quickly  2 weeks ago had CP, was central/left chest described as sharp sensation, lasted all day and constant regardless of position/activity. She woke the following day with it, and drank some ginger ale thinking maybe heartburn and did seem to help Has not had it again  Feet/toes tend to get reddened when seated especially  No recurrent bleeding or signs of bleeding  Device information MDT ILR implanted 07/31/2016,cryptogenic stroke   Past Medical History:  Diagnosis Date  . Allergic rhinitis, cause unspecified   . Anxiety state, unspecified   . Arthritis   . Colon polyp   . Diaphragmatic hernia without mention of obstruction or gangrene   .  Diverticulosis of colon (without mention of hemorrhage)   . Female stress incontinence   . HTN (hypertension)   . Insomnia, unspecified   . Interstitial cystitis    surgery 02/01/2013  . Irritable bowel syndrome   . Memory loss   . Other and unspecified hyperlipidemia   . Other premature beats   . Polyneuropathy in diabetes(357.2)   . Postmenopausal bleeding   . Proteinuria   . Rectal ulcer   . Skin cancer   . Stroke ([place]) 2018  . 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 Unspecified essential hypertension   . Viral warts, unspecified     Past Surgical History:  Procedure Laterality Date  . ABDOMINAL HYSTERECTOMY  1977   Dr.Hambright  . APPENDECTOMY  1965  . CATARACT EXTRACTION, BILATERAL  02/10/2006   Dr.Groat   . COLONOSCOPY  07/17/2004   polypectomy  . [place]  . LOOP RECORDER INSERTION N/A 07/31/2016   Procedure: Loop Recorder Insertion;  Surgeon: [name], MD;  Location: [facility];  Service: Cardiovascular;  Laterality: N/A;  . TEE WITHOUT CARDIOVERSION N/A 07/30/2016   Procedure: TRANSESOPHAGEAL ECHOCARDIOGRAM (TEE);  Surgeon: [name], MD;  Location: [facility];  Service: Cardiovascular;  Laterality: N/A;  . [place]  . TRIGGER FINGER RELEASE  05/13/16  . urinary bladder      surgery 02/01/2013 to stretch bladder stem    Current  Outpatient Medications  Medication Sig Dispense Refill  . ACCU-CHEK AVIVA PLUS test strip USE TO TEST BLOOD SUGAR TWICE DAILY. 100 each 11  . amLODipine (NORVASC) 2.5 MG tablet Take 1 tablet (2.5 mg total) by mouth 2 (two) times daily. 60 tablet 1  . atorvastatin (LIPITOR) 40 MG tablet TAKE 1 TABLET DAILY AT 6 P.M. 90 tablet 3  . citalopram (CELEXA) 10 MG tablet TAKE 1 TABLET DAILY 90 tablet 3  . ELIQUIS 5 MG TABS tablet TAKE 1 TABLET TWICE A DAY 180 tablet 1  . gabapentin (NEURONTIN) 300  MG capsule Take 1 capsule (300 mg total) by mouth 3 (three) times daily. 270 capsule 0  . hyoscyamine (LEVSIN SL) 0.125 MG SL tablet Place 1 tablet (0.125 mg total) under the tongue every 4 (four) hours as needed. Take tablet every 4-6 hours for abdominal pain and cramping 15 tablet 2  . Lancets (ACCU-CHEK MULTICLIX) lancets Use to test blood sugar twice daily. E11.29 100 each 11  . levocetirizine (XYZAL) 5 MG tablet TAKE 1 TABLET EVERY EVENING 90 tablet 3  . nitrofurantoin (MACRODANTIN) 100 MG capsule Take 100 mg by mouth 4 (four) times daily.    Marland Kitchen omeprazole (PRILOSEC) 20 MG capsule TAKE 1 CAPSULE TWICE A DAY 180 capsule 3  . ondansetron (ZOFRAN ODT) 4 MG disintegrating tablet Take 1 tablet (4 mg total) by mouth every 6 (six) hours. 15 tablet 5  . pioglitazone (ACTOS) 15 MG tablet TAKE 1 TABLET DAILY 90 tablet 3  . triamcinolone cream (KENALOG) 0.5 % Apply 1 application topically 2 (two) times daily. 454 g 1  . vitamin B-12 (CYANOCOBALAMIN) 1000 MCG tablet Take 1,000 mcg by mouth daily.    . Vitamin D, Ergocalciferol, (DRISDOL) 1.25 MG (50000 UNIT) CAPS capsule TAKE 1 CAPSULE ONCE WEEKLY FOR VITAMIN D 12 capsule 3   No current facility-administered medications for this visit.    Allergies:   Jardiance [empagliflozin], Metformin and related, Ceclor [cefaclor], Codeine, Lopid [gemfibrozil], Metoprolol, Morphine and related, Niacin, Niacin and related, Relafen [nabumetone], Septra [sulfamethoxazole-trimethoprim], Tetracycline, Toprol xl [metoprolol tartrate], and Adhesive [tape]   Social History:  The patient  reports that she quit smoking about 9 years ago. She has a 15.50 pack-year smoking history. She has never used smokeless tobacco. She reports that she does not drink alcohol and does not use drugs.   Family History:  The patient's family history includes Bladder Cancer in her father; COPD in her sister; Diabetes in her brother, brother, and sister; Heart attack in her brother and father;  Heart disease in her brother; Heart disease (age of onset: 8) in her brother; Hypertension in her brother, sister, and son; Pancreatic cancer (age of onset: 75) in her mother.  ROS:  Please see the history of present illness.    All other systems are reviewed and otherwise negative.   PHYSICAL EXAM:  VS:  There were no vitals taken for this visit. BMI: There is no height or weight on file to calculate BMI. Well nourished, well developed, in no acute distress HEENT: normocephalic, atraumatic Neck: no JVD, carotid bruits or masses Cardiac:  RRR; no significant murmurs, no rubs, or gallops Lungs:  CTA b/l, no wheezing, rhonchi or rales Abd: soft, nontender MS: no deformity, age appropriate atrophy Ext: no edema, numerous spider veins and some larger varicose veins, appears more dependent rubor, she has pedal pulses and brisk cap refill Skin: warm and dry, no rash Neuro:  No gross deficits appreciated Psych: euthymic mood, full affect  ILR site is stable, no tethering or discomfort   EKG:  Done today and reviewed by myself shows  SR 64bpm, 1st degree AVblock  Device interrogation done today and reviewed by myself:  Unable to interrogate her loop today Last transmission was 03/24/20 No brady/pause events on that transmission (to account for her syncopal event)  11/19/2018: TTE IMPRESSIONS  1. The left ventricle has normal systolic function with an ejection  fraction of 60-65%. The cavity size was normal. Left ventricular diastolic  Doppler parameters are consistent with pseudonormalization.  2. The right ventricle has normal systolic function. The cavity was  normal. There is no increase in right ventricular wall thickness. Right  ventricular systolic pressure is moderately elevated with an estimated  pressure of 39.4 mmHg.  3. Left atrial size was mild-moderately dilated.  4. Right atrial size was mildly dilated.  5. No evidence of mitral valve stenosis.  6. The tricuspid  valve is grossly normal.  7. No stenosis of the aortic valve.  8. The aorta is normal unless otherwise noted.  9. The aortic root and ascending aorta are normal in size and structure.  10. Pulmonary hypertension is moderate.  11. The atrial septum is grossly normal.   Recent Labs: 03/23/2020: ALT 10 07/19/2020: BUN 28; Creatinine, Ser 1.64; Hemoglobin 12.1; Platelets 167.0; Potassium 4.2; Sodium 138; TSH 4.00  No results found for requested labs within last 8760 hours.   CrCl cannot be calculated (Patient's most recent lab result is older than the maximum 21 days allowed.).   Wt Readings from Last 3 Encounters:  08/30/20 166 lb 6.4 oz (75.5 kg)  07/19/20 169 lb (76.7 kg)  07/05/20 171 lb 4.8 oz (77.7 kg)     Other studies reviewed: Additional studies/records reviewed today include: summarized above  ASSESSMENT AND PLAN:  1. Paroxysmal AFib     CHA2DS2Vasc is 8, on Eliquis     No symptoms of palpitations/Afib  Last Creat was 1.6, with her age, would need to consider reduction in dose of Eliquis, will check lab today, historically creat is betetr  2. Sinus node dysfunction     She had a syncopal spell Nov/dec.  She did have a loop transission 12/24 without brady or pause episode     She is not interested in another loop, will follow symptoms, if more syncope withknown pauses may need to consider pacing?   3. HTN     Looks OK  4. Chronic diastolic CHF     She does not appear volume OL  5. Loud R carotid bruit     She gets lightheaded bending forward, had a syncopal event as discussed above     Carotid US  6. CP     Sounds mostly atypical     Discussed stress testing, they would like to hold off unless she has more  Disposition: F/u in a few months sooner if needed   Current medicines are reviewed at length with the patient today.  The patient did not have any concerns regarding medicines.  Venetia Night, PA-C 08/30/2020 6:59 PM     [address] [place] [address] [phone] (office)  [phone] (fax)

## 2020-08-30 NOTE — Progress Notes (Signed)
Subjective:   Penny Miller is a 87 y.o. female who presents for Medicare Annual (Subsequent) preventive examination.  Review of Systems    No ROS. Medicare Wellness Visit. Additional risk factors are reflected in social history.  Cardiac Risk Factors include: advanced age (>25men, >57 women);dyslipidemia;family history of premature cardiovascular disease;hypertension     Objective:    Today's Vitals   08/30/20 1233  BP: 120/78  Pulse: (!) 58  Temp: 97.9 F (36.6 C)  SpO2: 97%  Weight: 166 lb 6.4 oz (75.5 kg)  Height: 5\' 6"  (1.676 m)  PainSc: 0-No pain   Body mass index is 26.86 kg/m.  Advanced Directives 03/15/2020 08/12/2019 08/02/2019 07/27/2019 06/07/2019 05/28/2019 05/17/2019  Does Patient Have a Medical Advance Directive? No No No No No No No  Type of Advance Directive - - - - - - -  Does patient want to make changes to medical advance directive? - No - Patient declined No - Patient declined - No - Patient declined - -  Copy of [place] in Chart? - - - - - - -  Would patient like information on creating a medical advance directive? No - Patient declined Yes (ED - Information included in AVS) - - - No - Patient declined -    Current Medications (verified) Outpatient Encounter Medications as of 08/30/2020  Medication Sig  . ACCU-CHEK AVIVA PLUS test strip USE TO TEST BLOOD SUGAR TWICE DAILY.  Marland Kitchen amLODipine (NORVASC) 2.5 MG tablet Take 1 tablet (2.5 mg total) by mouth 2 (two) times daily.  Marland Kitchen atorvastatin (LIPITOR) 40 MG tablet TAKE 1 TABLET DAILY AT 6 P.M.  . citalopram (CELEXA) 10 MG tablet TAKE 1 TABLET DAILY  . ELIQUIS 5 MG TABS tablet TAKE 1 TABLET TWICE A DAY  . hyoscyamine (LEVSIN SL) 0.125 MG SL tablet Place 1 tablet (0.125 mg total) under the tongue every 4 (four) hours as needed. Take tablet every 4-6 hours for abdominal pain and cramping  . Lancets (ACCU-CHEK MULTICLIX) lancets Use to test blood sugar twice daily. E11.29  . levocetirizine  (XYZAL) 5 MG tablet TAKE 1 TABLET EVERY EVENING  . nitrofurantoin (MACRODANTIN) 100 MG capsule Take 100 mg by mouth 4 (four) times daily.  Marland Kitchen omeprazole (PRILOSEC) 20 MG capsule TAKE 1 CAPSULE TWICE A DAY  . ondansetron (ZOFRAN ODT) 4 MG disintegrating tablet Take 1 tablet (4 mg total) by mouth every 6 (six) hours.  . pioglitazone (ACTOS) 15 MG tablet TAKE 1 TABLET DAILY  . triamcinolone cream (KENALOG) 0.5 % Apply 1 application topically 2 (two) times daily.  . vitamin B-12 (CYANOCOBALAMIN) 1000 MCG tablet Take 1,000 mcg by mouth daily.  . Vitamin D, Ergocalciferol, (DRISDOL) 1.25 MG (50000 UNIT) CAPS capsule TAKE 1 CAPSULE ONCE WEEKLY FOR VITAMIN D  . [DISCONTINUED] gabapentin (NEURONTIN) 300 MG capsule Take 1 capsule (300 mg total) by mouth 3 (three) times daily.   No facility-administered encounter medications on file as of 08/30/2020.    Allergies (verified) Jardiance [empagliflozin], Metformin and related, Ceclor [cefaclor], Codeine, Lopid [gemfibrozil], Metoprolol, Morphine and related, Niacin, Niacin and related, Relafen [nabumetone], Septra [sulfamethoxazole-trimethoprim], Tetracycline, Toprol xl [metoprolol tartrate], and Adhesive [tape]   History: Past Medical History:  Diagnosis Date  . Allergic rhinitis, cause unspecified   . Anxiety state, unspecified   . Arthritis   . Colon polyp   . Diaphragmatic hernia without mention of obstruction or gangrene   . Diverticulosis of colon (without mention of hemorrhage)   . Female  stress incontinence   . HTN (hypertension)   . Insomnia, unspecified   . Interstitial cystitis    surgery 02/01/2013  . Irritable bowel syndrome   . Memory loss   . Other and unspecified hyperlipidemia   . Other premature beats   . Polyneuropathy in diabetes(357.2)   . Postmenopausal bleeding   . Proteinuria   . Rectal ulcer   . Skin cancer   . Stroke ([place]) 2018  . Type II or unspecified type diabetes mellitus with neurological manifestations, not  stated as uncontrolled(250.60)   . Unspecified essential hypertension   . Viral warts, unspecified    Past Surgical History:  Procedure Laterality Date  . ABDOMINAL HYSTERECTOMY  1977   Dr.Hambright  . APPENDECTOMY  1965  . CATARACT EXTRACTION, BILATERAL  02/10/2006   Dr.Groat   . COLONOSCOPY  07/17/2004   polypectomy  . Advance  . LOOP RECORDER INSERTION N/A 07/31/2016   Procedure: Loop Recorder Insertion;  Surgeon: [name], MD;  Location: [facility];  Service: Cardiovascular;  Laterality: N/A;  . TEE WITHOUT CARDIOVERSION N/A 07/30/2016   Procedure: TRANSESOPHAGEAL ECHOCARDIOGRAM (TEE);  Surgeon: [name], MD;  Location: [facility];  Service: Cardiovascular;  Laterality: N/A;  . [place]  . TRIGGER FINGER RELEASE  05/13/16  . urinary bladder      surgery 02/01/2013 to stretch bladder stem   Family History  Problem Relation Age of Onset  . Pancreatic cancer Mother 23  . Heart attack Father   . Bladder Cancer Father   . Hypertension Sister   . Diabetes Sister   . COPD Sister   . Hypertension Son   . Diabetes Brother   . Heart disease Brother   . Hypertension Brother   . Diabetes Brother   . Heart disease Brother 75       Myocardial Infarction   . Heart attack Brother    Social History   Socioeconomic History  . Marital status: Widowed    Spouse name: Not on file  . Number of children: 1  . Years of education: Not on file  . Highest education level: Not on file  Occupational History  . 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 Smoking status: Former Smoker    Packs/day: 0.25    Years: 62.00    Pack years: 15.50    Quit date: 04/02/2011    Years since quitting: 9.4  . Smokeless tobacco: Never Used  . Tobacco comment: stopped consistently smoking in 2012 but still smokes occasionally (one pack per week)  Vaping Use  . Vaping Use: Never used  Substance and  Sexual Activity  . Alcohol use: No  . Drug use: No  . Sexual activity: Not Currently  Other Topics Concern  . Not on file  Social History Narrative   Ophthalmologist-Dr.Gould and Dr.Groat   Podiatrist- Dr.Tuchman   Dermatologist- Dr.Lupton   Urologist- Dr. Lawerance Bach    Lives with her son.    Social Determinants of Health   Financial Resource Strain: Low Risk   . Difficulty of Paying Living Expenses: Not hard at all  Food Insecurity: No Food Insecurity  . Worried About Charity fundraiser in the Last Year: Never true  . Ran Out of Food in the Last Year: Never true  Transportation Needs: No Transportation Needs  . Lack of Transportation (Medical): No  .  Lack of Transportation (Non-Medical): No  Physical Activity: Inactive  . Days of Exercise per Week: 0 days  . Minutes of Exercise per Session: 0 min  Stress: No Stress Concern Present  . Feeling of Stress : Not at all  Social Connections: Moderately Integrated  . Frequency of Communication with Friends and Family: More than three times a week  . Frequency of Social Gatherings with Friends and Family: Three times a week  . Attends Religious Services: More than 4 times per year  . Active Member of Clubs or Organizations: No  . Attends Archivist Meetings: More than 4 times per year  . Marital Status: Widowed    Tobacco Counseling Counseling given: Not Answered Comment: stopped consistently smoking in 2012 but still smokes occasionally (one pack per week)   Clinical Intake:  Pre-visit preparation completed: Yes  Pain : No/denies pain Pain Score: 0-No pain     BMI - recorded: 26.86 Nutritional Status: BMI 25 -29 Overweight Nutritional Risks: Nausea/ vomitting/ diarrhea Diabetes: Yes CBG done?: No Did pt. bring in CBG monitor from home?: No  How often do you need to have someone help you when you read instructions, pamphlets, or other written materials from your doctor or pharmacy?: 1 - Never What is the  last grade level you completed in school?: HSG  Diabetic? yes  Interpreter Needed?: No  Information entered by :: Lisette Abu, LPN   Activities of Daily Living In your present state of health, do you have any difficulty performing the following activities: 08/30/2020  Hearing? Y  Vision? N  Difficulty concentrating or making decisions? Y  Walking or climbing stairs? N  Dressing or bathing? N  Doing errands, shopping? Y  Preparing Food and eating ? N  Using the Toilet? N  In the past six months, have you accidently leaked urine? Y  Comment wears Depends for protection  Do you have problems with loss of bowel control? N  Managing your Medications? N  Managing your Finances? N  Housekeeping or managing your Housekeeping? N  Some recent data might be hidden    Patient Care Team: [name], MD as PCP - General (Internal Medicine) Minus Breeding, MD as PCP - Cardiology (Cardiology) Ulice Bold, MD as Referring Physician (Dermatology) Sharyne Peach, MD as Consulting Physician (Ophthalmology) Gean Birchwood, DPM as Consulting Physician (Podiatry) Clent Jacks, MD as Consulting Physician (Ophthalmology) Myrlene Broker, MD as Attending Physician (Urology) Minus Breeding, MD as Consulting Physician (Cardiology) Debbora Presto, NP as Nurse Practitioner (Family Medicine) Armbruster, Carlota Raspberry, MD as Consulting Physician (Gastroenterology)  Indicate any recent Medical Services you may have received from other than [facility] providers in the past year (date may be approximate).     Assessment:   This is a routine wellness examination for Penny Miller.  Hearing/Vision screen No exam data present  Dietary issues and exercise activities discussed: Current Exercise Habits: The patient does not participate in regular exercise at present, Exercise limited by: None identified  Goals Addressed   None    Depression Screen PHQ 2/9 Scores 08/30/2020 08/12/2019 07/27/2019 05/19/2019  07/24/2018 03/05/2018 12/15/2017  PHQ - 2 Score 0 0 0 0 0 0 0    Fall Risk Fall Risk  08/30/2020 02/03/2020 08/12/2019 08/02/2019 07/27/2019  Falls in the past year? 0 0 0 0 0  Number falls in past yr: 0 0 0 0 0  Comment - - - - -  Injury with Fall? 0 0 0 0 0  Comment - - - - -  Risk for fall due to : No Fall Risks No Fall Risks - - -  Risk for fall due to: Comment - - - - -  Follow up Falls evaluation completed - - - -    FALL RISK PREVENTION PERTAINING TO THE HOME:  Any stairs in or around the home? No  If so, are there any without handrails? No  Home free of loose throw rugs in walkways, pet beds, electrical cords, etc? Yes  Adequate lighting in your home to reduce risk of falls? Yes   ASSISTIVE DEVICES UTILIZED TO PREVENT FALLS:  Life alert? No  Use of a cane, walker or w/c? No  Grab bars in the bathroom? Yes  Shower chair or bench in shower? Yes  Elevated toilet seat or a handicapped toilet? No   TIMED UP AND GO:  Was the test performed? No .  Length of time to ambulate 10 feet: 0 sec.   Gait steady and fast without use of assistive device  Cognitive Function: MMSE - Mini Mental State Exam 07/21/2017 05/07/2016 12/21/2013  Orientation to time 5 5 5   Orientation to Place 5 4 4   Registration 3 3 3   Attention/ Calculation 5 5 5   Recall 3 3 2   Language- name 2 objects 2 2 2   Language- repeat 1 1 1   Language- follow 3 step command 3 3 3   Language- read & follow direction 1 1 1   Write a sentence 1 1 1   Copy design 1 1 1   Total score 30 29 28      6CIT Screen 07/27/2019 07/24/2018  What Year? 0 points 0 points  What month? 0 points 0 points  What time? 0 points 0 points  Count back from 20 0 points 0 points  Months in reverse 0 points 0 points  Repeat phrase 4 points 0 points  Total Score 4 0    Immunizations Immunization History  Administered Date(s) Administered  . Fluad Quad(high Dose 65+) 02/03/2020  . Influenza, High Dose Seasonal PF 12/31/2016, 12/15/2017,  01/25/2019  . Influenza,inj,Quad PF,6+ Mos 12/15/2012, 12/21/2013, 02/28/2015, 02/02/2016  . Influenza-Unspecified 01/22/2012  . PFIZER(Purple Top)SARS-COV-2 Vaccination 05/14/2019, 06/08/2019  . Pneumococcal Conjugate-13 05/07/2016  . Pneumococcal Polysaccharide-23 11/03/2019  . Pneumococcal-Unspecified 01/24/1995  . Td 09/14/2008  . Tdap 01/25/2020    TDAP status: Up to date  Flu Vaccine status: Up to date  Pneumococcal vaccine status: Up to date  Covid-19 vaccine status: Completed vaccines  Qualifies for Shingles Vaccine? Yes   Zostavax completed No   Shingrix Completed?: No.    Education has been provided regarding the importance of this vaccine. Patient has been advised to call insurance company to determine out of pocket expense if they have not yet received this vaccine. Advised may also receive vaccine at local pharmacy or Health Dept. Verbalized acceptance and understanding.  Screening Tests Health Maintenance  Topic Date Due  . Zoster Vaccines- Shingrix (1 of 2) Never done  . COVID-19 Vaccine (3 - Booster for Pfizer series) 11/08/2019  . OPHTHALMOLOGY EXAM  06/14/2020  . URINE MICROALBUMIN  08/08/2020  . INFLUENZA VACCINE  10/30/2020  . FOOT EXAM  11/06/2020  . HEMOGLOBIN A1C  01/18/2021  . TETANUS/TDAP  01/24/2030  . DEXA SCAN  Completed  . PNA vac Low Risk Adult  Completed  . HPV VACCINES  Aged Out    Health Maintenance  Health Maintenance Due  Topic Date Due  . Zoster Vaccines- Shingrix (1 of  2) Never done  . COVID-19 Vaccine (3 - Booster for Pfizer series) 11/08/2019  . OPHTHALMOLOGY EXAM  06/14/2020  . URINE MICROALBUMIN  08/08/2020    Colorectal cancer screening: No longer required.   Mammogram status: No longer required due to age.  Lung Cancer Screening: (Low Dose CT Chest recommended if Age 46-80 years, 30 pack-year currently smoking OR have quit w/in 15years.) does not qualify.   Lung Cancer Screening Referral: no  Additional  Screening:  Hepatitis C Screening: does not qualify; Completed no  Vision Screening: Recommended annual ophthalmology exams for early detection of glaucoma and other disorders of the eye. Is the patient up to date with their annual eye exam?  Yes  Who is the provider or what is the name of the office in which the patient attends annual eye exams? Clent Jacks, MD. If pt is not established with a provider, would they like to be referred to a provider to establish care? No .   Dental Screening: Recommended annual dental exams for proper oral hygiene  Community Resource Referral / Chronic Care Management: CRR required this visit?  No   CCM required this visit?  No      Plan:     I have personally reviewed and noted the following in the patient's chart:   . Medical and social history . Use of alcohol, tobacco or illicit drugs  . Current medications and supplements including opioid prescriptions.  . Functional ability and status . Nutritional status . Physical activity . Advanced directives . List of other physicians . Hospitalizations, surgeries, and ER visits in previous 12 months . Vitals . Screenings to include cognitive, depression, and falls . Referrals and appointments  In addition, I have reviewed and discussed with patient certain preventive protocols, quality metrics, and best practice recommendations. A written personalized care plan for preventive services as well as general preventive health recommendations were provided to patient.     Sheral Flow, LPN   07/08/1854   Nurse Notes:  Medications reviewed with patient; no opioid use noted.

## 2020-08-30 NOTE — Patient Instructions (Signed)
[name] , Thank you for taking time to come for your Medicare Wellness Visit. I appreciate your ongoing commitment to your health goals. Please review the following plan we discussed and let me know if I can assist you in the future.   Screening recommendations/referrals: Colonoscopy: no repeat due to age 87: 02/04/2012 Bone Density: 07/24/2009 Recommended yearly ophthalmology/optometry visit for glaucoma screening and checkup Recommended yearly dental visit for hygiene and checkup  Vaccinations: Influenza vaccine: 02/03/2020 Pneumococcal vaccine: 05/07/2016, 11/03/2019 Tdap vaccine: 01/25/2020; due every 10 years Shingles vaccine: done at [facility] Covid-19: 05/14/2019, 06/08/2019  Advanced directives: Please bring a copy of your health care power of attorney and living will to the office at your convenience.  Conditions/risks identified: Yes; Reviewed health maintenance screenings with patient today and relevant education, vaccines, and/or referrals were provided. Please continue to do your personal lifestyle choices by: daily care of teeth and gums, regular physical activity (goal should be 5 days a week for 30 minutes), eat a healthy diet, avoid tobacco and drug use, limiting any alcohol intake, taking a low-dose aspirin (if not allergic or have been advised by your provider otherwise) and taking vitamins and minerals as recommended by your provider. Continue doing brain stimulating activities (puzzles, reading, adult coloring books, staying active) to keep memory sharp. Continue to eat heart healthy diet (full of fruits, vegetables, whole grains, lean protein, water--limit salt, fat, and sugar intake) and increase physical activity as tolerated.  Next appointment: Please schedule your next Medicare Wellness Visit with your Nurse Health Advisor in 1 year by calling [phone].   Preventive Care 85 Years and Older, Female Preventive care refers to lifestyle choices and visits with your health  care provider that can promote health and wellness. What does preventive care include?  A yearly physical exam. This is also called an annual well check.  Dental exams once or twice a year.  Routine eye exams. Ask your health care provider how often you should have your eyes checked.  Personal lifestyle choices, including:  Daily care of your teeth and gums.  Regular physical activity.  Eating a healthy diet.  Avoiding tobacco and drug use.  Limiting alcohol use.  Practicing safe sex.  Taking low-dose aspirin every day.  Taking vitamin and mineral supplements as recommended by your health care provider. What happens during an annual well check? The services and screenings done by your health care provider during your annual well check will depend on your age, overall health, lifestyle risk factors, and family history of disease. Counseling  Your health care provider may ask you questions about your:  Alcohol use.  Tobacco use.  Drug use.  Emotional well-being.  Home and relationship well-being.  Sexual activity.  Eating habits.  History of falls.  Memory and ability to understand (cognition).  Work and work Statistician.  Reproductive health. Screening  You may have the following tests or measurements:  Height, weight, and BMI.  Blood pressure.  Lipid and cholesterol levels. These may be checked every 5 years, or more frequently if you are over 57 years old.  Skin check.  Lung cancer screening. You may have this screening every year starting at age 45 if you have a 30-pack-year history of smoking and currently smoke or have quit within the past 15 years.  Fecal occult blood test (FOBT) of the stool. You may have this test every year starting at age 23.  Flexible sigmoidoscopy or colonoscopy. You may have a sigmoidoscopy every 5 years or a  colonoscopy every 10 years starting at age 18.  Hepatitis C blood test.  Hepatitis B blood test.  Sexually  transmitted disease (STD) testing.  Diabetes screening. This is done by checking your blood sugar (glucose) after you have not eaten for a while (fasting). You may have this done every 1-3 years.  Bone density scan. This is done to screen for osteoporosis. You may have this done starting at age 89.  Mammogram. This may be done every 1-2 years. Talk to your health care provider about how often you should have regular mammograms. Talk with your health care provider about your test results, treatment options, and if necessary, the need for more tests. Vaccines  Your health care provider may recommend certain vaccines, such as:  Influenza vaccine. This is recommended every year.  Tetanus, diphtheria, and acellular pertussis (Tdap, Td) vaccine. You may need a Td booster every 10 years.  Zoster vaccine. You may need this after age 40.  Pneumococcal 13-valent conjugate (PCV13) vaccine. One dose is recommended after age 45.  Pneumococcal polysaccharide (PPSV23) vaccine. One dose is recommended after age 11. Talk to your health care provider about which screenings and vaccines you need and how often you need them. This information is not intended to replace advice given to you by your health care provider. Make sure you discuss any questions you have with your health care provider. Document Released: 04/14/2015 Document Revised: 12/06/2015 Document Reviewed: 01/17/2015 Elsevier Interactive Patient Education  2017 [place] Prevention in the Home Falls can cause injuries. They can happen to people of all ages. There are many things you can do to make your home safe and to help prevent falls. What can I do on the outside of my home?  Regularly fix the edges of walkways and driveways and fix any cracks.  Remove anything that might make you trip as you walk through a door, such as a raised step or threshold.  Trim any bushes or trees on the path to your home.  Use bright outdoor  lighting.  Clear any walking paths of anything that might make someone trip, such as rocks or tools.  Regularly check to see if handrails are loose or broken. Make sure that both sides of any steps have handrails.  Any raised decks and porches should have guardrails on the edges.  Have any leaves, snow, or ice cleared regularly.  Use sand or salt on walking paths during winter.  Clean up any spills in your garage right away. This includes oil or grease spills. What can I do in the bathroom?  Use night lights.  Install grab bars by the toilet and in the tub and shower. Do not use towel bars as grab bars.  Use non-skid mats or decals in the tub or shower.  If you need to sit down in the shower, use a plastic, non-slip stool.  Keep the floor dry. Clean up any water that spills on the floor as soon as it happens.  Remove soap buildup in the tub or shower regularly.  Attach bath mats securely with double-sided non-slip rug tape.  Do not have throw rugs and other things on the floor that can make you trip. What can I do in the bedroom?  Use night lights.  Make sure that you have a light by your bed that is easy to reach.  Do not use any sheets or blankets that are too big for your bed. They should not hang down onto the  floor.  Have a firm chair that has side arms. You can use this for support while you get dressed.  Do not have throw rugs and other things on the floor that can make you trip. What can I do in the kitchen?  Clean up any spills right away.  Avoid walking on wet floors.  Keep items that you use a lot in easy-to-reach places.  If you need to reach something above you, use a strong step stool that has a grab bar.  Keep electrical cords out of the way.  Do not use floor polish or wax that makes floors slippery. If you must use wax, use non-skid floor wax.  Do not have throw rugs and other things on the floor that can make you trip. What can I do with my  stairs?  Do not leave any items on the stairs.  Make sure that there are handrails on both sides of the stairs and use them. Fix handrails that are broken or loose. Make sure that handrails are as long as the stairways.  Check any carpeting to make sure that it is firmly attached to the stairs. Fix any carpet that is loose or worn.  Avoid having throw rugs at the top or bottom of the stairs. If you do have throw rugs, attach them to the floor with carpet tape.  Make sure that you have a light switch at the top of the stairs and the bottom of the stairs. If you do not have them, ask someone to add them for you. What else can I do to help prevent falls?  Wear shoes that:  Do not have high heels.  Have rubber bottoms.  Are comfortable and fit you well.  Are closed at the toe. Do not wear sandals.  If you use a stepladder:  Make sure that it is fully opened. Do not climb a closed stepladder.  Make sure that both sides of the stepladder are locked into place.  Ask someone to hold it for you, if possible.  Clearly mark and make sure that you can see:  Any grab bars or handrails.  First and last steps.  Where the edge of each step is.  Use tools that help you move around (mobility aids) if they are needed. These include:  Canes.  Walkers.  Scooters.  Crutches.  Turn on the lights when you go into a dark area. Replace any light bulbs as soon as they burn out.  Set up your furniture so you have a clear path. Avoid moving your furniture around.  If any of your floors are uneven, fix them.  If there are any pets around you, be aware of where they are.  Review your medicines with your doctor. Some medicines can make you feel dizzy. This can increase your chance of falling. Ask your doctor what other things that you can do to help prevent falls. This information is not intended to replace advice given to you by your health care provider. Make sure you discuss any  questions you have with your health care provider. Document Released: 01/12/2009 Document Revised: 08/24/2015 Document Reviewed: 04/22/2014 Elsevier Interactive Patient Education  2017 [employer].

## 2020-08-31 ENCOUNTER — Ambulatory Visit (INDEPENDENT_AMBULATORY_CARE_PROVIDER_SITE_OTHER): Payer: Medicare Other | Admitting: Physician Assistant

## 2020-08-31 ENCOUNTER — Encounter: Payer: Self-pay | Admitting: Physician Assistant

## 2020-08-31 ENCOUNTER — Other Ambulatory Visit (HOSPITAL_COMMUNITY): Payer: Self-pay | Admitting: Internal Medicine

## 2020-08-31 ENCOUNTER — Other Ambulatory Visit: Payer: Self-pay

## 2020-08-31 VITALS — BP 136/64 | HR 64 | Ht 66.0 in | Wt 168.0 lb

## 2020-08-31 DIAGNOSIS — Z79899 Other long term (current) drug therapy: Secondary | ICD-10-CM | POA: Diagnosis not present

## 2020-08-31 DIAGNOSIS — R079 Chest pain, unspecified: Secondary | ICD-10-CM | POA: Diagnosis not present

## 2020-08-31 DIAGNOSIS — R0989 Other specified symptoms and signs involving the circulatory and respiratory systems: Secondary | ICD-10-CM

## 2020-08-31 DIAGNOSIS — I495 Sick sinus syndrome: Secondary | ICD-10-CM

## 2020-08-31 DIAGNOSIS — I480 Paroxysmal atrial fibrillation: Secondary | ICD-10-CM

## 2020-08-31 DIAGNOSIS — R55 Syncope and collapse: Secondary | ICD-10-CM

## 2020-08-31 LAB — BASIC METABOLIC PANEL
BUN/Creatinine Ratio: 16 (ref 12–28)
BUN: 27 mg/dL (ref 8–27)
CO2: 22 mmol/L (ref 20–29)
Calcium: 9.3 mg/dL (ref 8.7–10.3)
Chloride: 102 mmol/L (ref 96–106)
Creatinine, Ser: 1.65 mg/dL — ABNORMAL HIGH (ref 0.57–1.00)
Glucose: 84 mg/dL (ref 65–99)
Potassium: 4.6 mmol/L (ref 3.5–5.2)
Sodium: 138 mmol/L (ref 134–144)
eGFR: 30 mL/min/{1.73_m2} — ABNORMAL LOW (ref >59)

## 2020-08-31 NOTE — Patient Instructions (Addendum)
Medication Instructions:   Your physician recommends that you continue on your current medications as directed. Please refer to the Current Medication list given to you today.   *If you need a refill on your cardiac medications before your next appointment, please call your pharmacy*   Lab Work:  BMET TODAY     If you have labs (blood work) drawn today and your tests are completely normal, you will receive your results only by: Marland Kitchen MyChart Message (if you have MyChart) OR . A paper copy in the mail If you have any lab test that is abnormal or we need to change your treatment, we will call you to review the results.   Testing/Procedures: Your physician has requested that you have a carotid duplex. This test is an ultrasound of the carotid arteries in your neck. It looks at blood flow through these arteries that supply the brain with blood. Allow one hour for this exam. There are no restrictions or special instructions.   Follow-Up: At [facility], you and your health needs are our priority.  As part of our continuing mission to provide you with exceptional heart care, we have created designated Provider Care Teams.  These Care Teams include your primary Cardiologist (physician) and Advanced Practice Providers (APPs -  Physician Assistants and Nurse Practitioners) who all work together to provide you with the care you need, when you need it.  We recommend signing up for the patient portal called "MyChart".  Sign up information is provided on this After Visit Summary.  MyChart is used to connect with patients for Virtual Visits (Telemedicine).  Patients are able to view lab/test results, encounter notes, upcoming appointments, etc.  Non-urgent messages can be sent to your provider as well.   To learn more about what you can do with MyChart, go to [email or web address].    Your next appointment:   2 month(s)  The format for your next appointment:   In Person  Provider:   You may see  [name]   or one of the following Advanced Practice Providers on your designated Care Team:    Chanetta Marshall, NP  Tommye Standard, PA-C  Legrand Como "Oda Kilts, [place]    Other Instructions

## 2020-08-31 NOTE — Telephone Encounter (Signed)
Prescription refill request for Eliquis received. Indication: afib, stroke Last office visit: 04/20/2019, Lovena Le Scr: 1.64, 07/19/2020 Age: 87 yo  Weight: 75.5 kg   Scheduled to see Charlcie Cradle, pt will need updated labs to verify wether she needs a dose change of Eliquis.

## 2020-09-01 MED ORDER — APIXABAN 2.5 MG PO TABS: 2.5000 mg | ORAL_TABLET | Freq: Two times a day (BID) | ORAL | 0 refills | Status: DC

## 2020-09-01 NOTE — Telephone Encounter (Signed)
Prescription refill request for Eliquis 5mg  received.qualifiess only for eliquis 2.5 will route to pharmd pool.  Indication: afib, stroke Last office visit: 04/20/2019, Lovena Le scheduled to see renee ursuy on 11/06/20 (overdue) Scr: 1.65, 08/31/2020 Age: 87 yo  Weight: 75.5 kg

## 2020-09-01 NOTE — Telephone Encounter (Signed)
Afib indication, age 87, weight 76kg, SCr now > 1.5 on 2 checks: 1.64 on 07/19/20 and 1.65 on 08/31/20. Agree with dose reduction of Eliquis down to 2.5mg  BID. Please advise pt of dose decrease and send in new rx for lower strength. She can cut her current 5mg  tablets in half to use them up before she picks up new rx for lower dose.

## 2020-09-01 NOTE — Telephone Encounter (Signed)
Called and expressed dose decrease and the pt voiced uderstanding

## 2020-09-06 DIAGNOSIS — R319 Hematuria, unspecified: Secondary | ICD-10-CM | POA: Diagnosis not present

## 2020-09-06 DIAGNOSIS — D509 Iron deficiency anemia, unspecified: Secondary | ICD-10-CM | POA: Diagnosis not present

## 2020-09-06 DIAGNOSIS — N390 Urinary tract infection, site not specified: Secondary | ICD-10-CM | POA: Diagnosis not present

## 2020-09-06 DIAGNOSIS — N184 Chronic kidney disease, stage 4 (severe): Secondary | ICD-10-CM | POA: Diagnosis not present

## 2020-09-06 DIAGNOSIS — E1122 Type 2 diabetes mellitus with diabetic chronic kidney disease: Secondary | ICD-10-CM | POA: Diagnosis not present

## 2020-09-06 DIAGNOSIS — D1771 Benign lipomatous neoplasm of kidney: Secondary | ICD-10-CM | POA: Diagnosis not present

## 2020-09-06 DIAGNOSIS — I129 Hypertensive chronic kidney disease with stage 1 through stage 4 chronic kidney disease, or unspecified chronic kidney disease: Secondary | ICD-10-CM | POA: Diagnosis not present

## 2020-09-25 ENCOUNTER — Encounter: Payer: Self-pay | Admitting: Internal Medicine

## 2020-09-25 ENCOUNTER — Ambulatory Visit (INDEPENDENT_AMBULATORY_CARE_PROVIDER_SITE_OTHER): Payer: Medicare Other | Admitting: Internal Medicine

## 2020-09-25 ENCOUNTER — Other Ambulatory Visit: Payer: Self-pay

## 2020-09-25 VITALS — BP 138/72 | HR 63 | Temp 98.5°F | Resp 16 | Ht 66.0 in | Wt 168.0 lb

## 2020-09-25 DIAGNOSIS — N1831 Chronic kidney disease, stage 3a: Secondary | ICD-10-CM

## 2020-09-25 DIAGNOSIS — I10 Essential (primary) hypertension: Secondary | ICD-10-CM

## 2020-09-25 DIAGNOSIS — N184 Chronic kidney disease, stage 4 (severe): Secondary | ICD-10-CM | POA: Diagnosis not present

## 2020-09-25 DIAGNOSIS — E1122 Type 2 diabetes mellitus with diabetic chronic kidney disease: Secondary | ICD-10-CM

## 2020-09-25 DIAGNOSIS — M5416 Radiculopathy, lumbar region: Secondary | ICD-10-CM | POA: Diagnosis not present

## 2020-09-25 DIAGNOSIS — M48062 Spinal stenosis, lumbar region with neurogenic claudication: Secondary | ICD-10-CM

## 2020-09-25 DIAGNOSIS — E1142 Type 2 diabetes mellitus with diabetic polyneuropathy: Secondary | ICD-10-CM

## 2020-09-25 MED ORDER — OXYCODONE HCL 5 MG PO TABS: 5.0000 mg | ORAL_TABLET | ORAL | 0 refills | Status: DC | PRN

## 2020-09-25 MED ORDER — GABAPENTIN 300 MG PO CAPS: 300.0000 mg | ORAL_CAPSULE | Freq: Three times a day (TID) | ORAL | 1 refills | Status: DC

## 2020-09-25 NOTE — Patient Instructions (Signed)
Lumbosacral Radiculopathy Lumbosacral radiculopathy is a condition that involves the spinal nerves and nerve roots in the low back and bottom of the spine. The condition develops when these nerves and nerve roots move out of place or become inflamed andcause symptoms. What are the causes? This condition may be caused by: Pressure from a disk that bulges out of place (herniated disk). A disk is a plate of soft cartilage that separates bones in the spine. Disk changes that occur with age (disk degeneration). A narrowing of the bones of the lower back (spinal stenosis). A tumor. An infection. An injury that places sudden pressure on the disks that cushion the bones of your lower spine. What increases the risk? You are more likely to develop this condition if: You are a female who is 23-74 years old. You are a female who is 57-24 years old. You use improper technique when lifting things. You are overweight or live a sedentary lifestyle. You smoke. Your work requires frequent lifting. You do repetitive activities that strain the spine. What are the signs or symptoms? Symptoms of this condition include: Pain that goes down from your back into your legs (sciatica), usually on one side of the body. This is the most common symptom. The pain may be worse with sitting, coughing, or sneezing. Pain and numbness in your legs. Muscle weakness. Tingling. Loss of bladder control or bowel control. How is this diagnosed? This condition may be diagnosed based on: Your symptoms and medical history. A physical exam. If the pain is lasting, you may have tests, such as: MRI scan. X-ray. CT scan. A type of X-ray used to examine the spinal canal after injecting a dye into your spine (myelogram). A test to measure how electrical impulses move through a nerve (nerve conduction study). How is this treated? Treatment may depend on the cause of the condition and may include: Working with a physical  therapist. Taking pain medicine. Applying heat and ice to affected areas. Doing stretches to improve flexibility. Doing exercises to strengthen back muscles. Having chiropractic spinal manipulation. Using transcutaneous electrical nerve stimulation (TENS) therapy. Getting a steroid injection in the spine. In some cases, no treatment is needed. If the condition is long-lasting (chronic), or if symptoms are severe, treatment may involve surgery or lifestylechanges, such as following a weight-loss plan. Follow these instructions at home: Activity Avoid bending and other activities that make the problem worse. Maintain a proper position when standing or sitting: When standing, keep your upper back and neck straight, with your shoulders pulled back. Avoid slouching. When sitting, keep your back straight and relax your shoulders. Do not round your shoulders or pull them backward. Do not sit or stand in one place for long periods of time. Take brief periods of rest throughout the day. This will reduce your pain. It is usually better to rest by lying down or standing, not sitting. When you are resting for longer periods, mix in some mild activity or stretching between periods of rest. This will help to prevent stiffness and pain. Get regular exercise. Ask your health care provider what activities are safe for you. If you were shown how to do any exercises or stretches, do them as directed by your health care provider. Do not lift anything that is heavier than 10 lb (4.5 kg) or the limit that you are told by your health care provider. Always use proper lifting technique, which includes: Bending your knees. Keeping the load close to your body. Avoiding twisting. Managing pain  If directed, put ice on the affected area: Put ice in a plastic bag. Place a towel between your skin and the bag. Leave the ice on for 20 minutes, 2-3 times a day. If directed, apply heat to the affected area as often as told  by your health care provider. Use the heat source that your health care provider recommends, such as a moist heat pack or a heating pad. Place a towel between your skin and the heat source. Leave the heat on for 20-30 minutes. Remove the heat if your skin turns bright red. This is especially important if you are unable to feel pain, heat, or cold. You may have a greater risk of getting burned. Take over-the-counter and prescription medicines only as told by your health care provider. General instructions Sleep on a firm mattress in a comfortable position. Try lying on your side with your knees slightly bent. If you lie on your back, put a pillow under your knees. Do not drive or use heavy machinery while taking prescription pain medicine. If your health care provider prescribed a diet or exercise program, follow it as directed. Keep all follow-up visits as told by your health care provider. This is important. Contact a health care provider if: Your pain does not improve over time, even when taking pain medicines. Get help right away if: You develop severe pain. Your pain suddenly gets worse. You develop increasing weakness in your legs. You lose the ability to control your bladder or bowel. You have difficulty walking or balancing. You have a fever. Summary Lumbosacral radiculopathy is a condition that occurs when the spinal nerves and nerve roots in the lower part of the spine move out of place or become inflamed and cause symptoms. Symptoms include pain, numbness, and tingling that go down from your back into your legs (sciatica), muscle weakness, and loss of bladder control or bowel control. If directed, apply ice or heat to the affected area as told by your health care provider. Follow instructions about activity, rest, and proper lifting technique. This information is not intended to replace advice given to you by your health care provider. Make sure you discuss any questions you have  with your healthcare provider. Document Revised: 01/27/2020 Document Reviewed: 01/27/2020 Elsevier Patient Education  2022 [employer].

## 2020-09-25 NOTE — Progress Notes (Signed)
Subjective:  Patient ID: Penny Miller, female    DOB: [birth year]  Age: 87 y.o. MRN: [id number]  CC: Back Pain  This visit occurred during the SARS-CoV-2 public health emergency.  Safety protocols were in place, including screening questions prior to the visit, additional usage of staff PPE, and extensive cleaning of exam room while observing appropriate contact time as indicated for disinfecting solutions.    HPI [name] presents for f/up -  She complains of worsening low back pain.  She tells me that she has spinal stenosis and is not a candidate for surgery.  The back pain interferes with her daily activities and ability to ambulate.  The back pain does not radiate into her lower extremities and she denies paresthesias.  Outpatient Medications Prior to Visit  Medication Sig Dispense Refill   ACCU-CHEK AVIVA PLUS test strip USE TO TEST BLOOD SUGAR TWICE DAILY. 100 each 11   amLODipine (NORVASC) 2.5 MG tablet Take 1 tablet (2.5 mg total) by mouth 2 (two) times daily. 60 tablet 1   apixaban (ELIQUIS) 2.5 MG TABS tablet Take 1 tablet (2.5 mg total) by mouth 2 (two) times daily. 60 tablet 0   atorvastatin (LIPITOR) 40 MG tablet TAKE 1 TABLET DAILY AT 6 P.M. 90 tablet 3   citalopram (CELEXA) 10 MG tablet TAKE 1 TABLET DAILY 90 tablet 3   hyoscyamine (LEVSIN SL) 0.125 MG SL tablet Place 1 tablet (0.125 mg total) under the tongue every 4 (four) hours as needed. Take tablet every 4-6 hours for abdominal pain and cramping 15 tablet 2   Lancets (ACCU-CHEK MULTICLIX) lancets Use to test blood sugar twice daily. E11.29 100 each 11   levocetirizine (XYZAL) 5 MG tablet TAKE 1 TABLET EVERY EVENING 90 tablet 3   nitrofurantoin (MACRODANTIN) 100 MG capsule Take 100 mg by mouth 4 (four) times daily.     omeprazole (PRILOSEC) 20 MG capsule TAKE 1 CAPSULE TWICE A DAY 180 capsule 3   ondansetron (ZOFRAN ODT) 4 MG disintegrating tablet Take 1 tablet (4 mg total) by mouth every 6 (six) hours. 15 tablet  5   pioglitazone (ACTOS) 15 MG tablet TAKE 1 TABLET DAILY 90 tablet 3   triamcinolone cream (KENALOG) 0.5 % Apply 1 application topically 2 (two) times daily. 454 g 1   vitamin B-12 (CYANOCOBALAMIN) 1000 MCG tablet Take 1,000 mcg by mouth daily.     Vitamin D, Ergocalciferol, (DRISDOL) 1.25 MG (50000 UNIT) CAPS capsule TAKE 1 CAPSULE ONCE WEEKLY FOR VITAMIN D 12 capsule 3   gabapentin (NEURONTIN) 300 MG capsule Take 1 capsule (300 mg total) by mouth 3 (three) times daily. 270 capsule 0   No facility-administered medications prior to visit.    ROS Review of Systems  Constitutional:  Negative for diaphoresis and fatigue.  HENT: Negative.    Eyes: Negative.   Respiratory:  Negative for cough, chest tightness, shortness of breath and wheezing.   Cardiovascular:  Negative for chest pain, palpitations and leg swelling.  Gastrointestinal:  Negative for abdominal pain, constipation, diarrhea and vomiting.  Endocrine: Negative.   Genitourinary: Negative.  Negative for difficulty urinating.  Musculoskeletal:  Positive for back pain. Negative for arthralgias and myalgias.  Skin: Negative.   Allergic/Immunologic: Negative.   Neurological: Negative.  Negative for dizziness, weakness, numbness and headaches.  Hematological: Negative.   Psychiatric/Behavioral: Negative.     Objective:  BP 138/72 (BP Location: Left Arm, Patient Position: Sitting, Cuff Size: Large)   Pulse 63   Temp 98.5  F (36.9 C) (Oral)   Resp 16   Ht 5\' 6"  (1.676 m)   Wt 168 lb (76.2 kg)   SpO2 96%   BMI 27.12 kg/m   BP Readings from Last 3 Encounters:  09/25/20 138/72  08/31/20 136/64  08/30/20 120/78    Wt Readings from Last 3 Encounters:  09/25/20 168 lb (76.2 kg)  08/31/20 168 lb (76.2 kg)  08/30/20 166 lb 6.4 oz (75.5 kg)    Physical Exam Vitals reviewed.  HENT:     Nose: Nose normal.     Mouth/Throat:     Mouth: Mucous membranes are moist.  Eyes:     General: No scleral icterus.     Conjunctiva/sclera: Conjunctivae normal.  Cardiovascular:     Rate and Rhythm: Normal rate and regular rhythm.     Heart sounds: No murmur heard. Pulmonary:     Effort: Pulmonary effort is normal.     Breath sounds: No stridor. No wheezing, rhonchi or rales.  Abdominal:     General: Abdomen is protuberant. Bowel sounds are normal. There is no distension.     Palpations: Abdomen is soft. There is no hepatomegaly, splenomegaly or mass.     Tenderness: There is no abdominal tenderness.  Musculoskeletal:        General: Normal range of motion.     Cervical back: Neck supple.     Right lower leg: No edema.     Left lower leg: No edema.  Lymphadenopathy:     Cervical: No cervical adenopathy.  Skin:    General: Skin is warm and dry.  Neurological:     General: No focal deficit present.  Psychiatric:        Mood and Affect: Mood normal.        Behavior: Behavior normal.    Lab Results  Component Value Date   WBC 8.9 07/19/2020   HGB 12.1 07/19/2020   HCT 36.2 07/19/2020   PLT 167.0 07/19/2020   GLUCOSE 84 08/31/2020   CHOL 160 08/09/2019   TRIG 161 (H) 08/09/2019   HDL 51 08/09/2019   LDLCALC 83 08/09/2019   ALT 10 03/23/2020   AST 14 03/23/2020   NA 138 08/31/2020   K 4.6 08/31/2020   CL 102 08/31/2020   CREATININE 1.65 (H) 08/31/2020   BUN 27 08/31/2020   CO2 22 08/31/2020   TSH 4.00 07/19/2020   INR 1.06 07/08/2016   HGBA1C 6.7 (H) 07/19/2020   MICROALBUR 9.8 08/09/2019    No results found.  Assessment & Plan:   Sylvi was seen today for back pain.  Diagnoses and all orders for this visit:  Type 2 diabetes mellitus with stage 3a chronic kidney disease, without long-term current use of insulin ([place])- Her blood sugar is well controlled.  Lumbar radiculopathy -     gabapentin (NEURONTIN) 300 MG capsule; Take 1 capsule (300 mg total) by mouth 3 (three) times daily.  Diabetic polyneuropathy associated with type 2 diabetes mellitus (HCC) -     gabapentin  (NEURONTIN) 300 MG capsule; Take 1 capsule (300 mg total) by mouth 3 (three) times daily. -     oxyCODONE (OXY IR/ROXICODONE) 5 MG immediate release tablet; Take 1 tablet (5 mg total) by mouth every 4 (four) hours as needed for severe pain.  Benign hypertension- Her BP is well controlled.  Chronic renal disease, stage 4, severely decreased glomerular filtration rate (GFR) between 15-29 mL/min/1.73 square meter (HCC)  Spinal stenosis, lumbar region with neurogenic claudication -  oxyCODONE (OXY IR/ROXICODONE) 5 MG immediate release tablet; Take 1 tablet (5 mg total) by mouth every 4 (four) hours as needed for severe pain.  Spinal stenosis of lumbar region with neurogenic claudication -     oxyCODONE (OXY IR/ROXICODONE) 5 MG immediate release tablet; Take 1 tablet (5 mg total) by mouth every 4 (four) hours as needed for severe pain.  I am having Oline P. Shaff start on oxyCODONE. I am also having her maintain her accu-chek multiclix, Accu-Chek Aviva Plus, Vitamin D (Ergocalciferol), citalopram, pioglitazone, atorvastatin, vitamin B-12, triamcinolone cream, hyoscyamine, ondansetron, amLODipine, nitrofurantoin, levocetirizine, omeprazole, apixaban, and gabapentin.  Meds ordered this encounter  Medications   gabapentin (NEURONTIN) 300 MG capsule    Sig: Take 1 capsule (300 mg total) by mouth 3 (three) times daily.    Dispense:  270 capsule    Refill:  1   oxyCODONE (OXY IR/ROXICODONE) 5 MG immediate release tablet    Sig: Take 1 tablet (5 mg total) by mouth every 4 (four) hours as needed for severe pain.    Dispense:  65 tablet    Refill:  0     Follow-up: Return in about 3 months (around 12/26/2020).  Scarlette Calico, MD

## 2020-09-29 ENCOUNTER — Other Ambulatory Visit: Payer: Self-pay

## 2020-09-29 ENCOUNTER — Ambulatory Visit (HOSPITAL_COMMUNITY)
Admission: RE | Admit: 2020-09-29 | Discharge: 2020-09-29 | Disposition: A | Discharge status: Home / Self Care | Payer: Medicare Other | Source: Ambulatory Visit | Attending: Cardiovascular Disease | Admitting: Cardiovascular Disease

## 2020-09-29 DIAGNOSIS — R0989 Other specified symptoms and signs involving the circulatory and respiratory systems: Secondary | ICD-10-CM | POA: Insufficient documentation

## 2020-09-29 DIAGNOSIS — R55 Syncope and collapse: Secondary | ICD-10-CM | POA: Insufficient documentation

## 2020-10-03 ENCOUNTER — Other Ambulatory Visit: Payer: Self-pay | Admitting: Internal Medicine

## 2020-10-03 DIAGNOSIS — I10 Essential (primary) hypertension: Secondary | ICD-10-CM

## 2020-10-04 ENCOUNTER — Telehealth: Payer: Self-pay | Admitting: *Deleted

## 2020-10-04 NOTE — Telephone Encounter (Signed)
Lvm on patient home phone to call clinic back to discuss results

## 2020-10-04 NOTE — Telephone Encounter (Signed)
-----   Message from [place], [place] sent at 10/03/2020  7:07 AM EDT ----- Mild plaque build up only b/l carotids.  Nothing to cause fainting or symptoms.  I see she is on Lipitor, importanat to keep her cholesterol well controlled.  Does her PMD keep track of that for her?

## 2020-11-02 NOTE — Progress Notes (Signed)
Electrophysiology Office Note Date: 11/06/2020  ID:  Miller, Penny July 29, 1931, MRN [id number]  PCP: [name], MD Primary Cardiologist: Minus Breeding, MD Electrophysiologist: Cristopher Peru, MD   CC: ILR follow-up  Penny Miller is a 87 y.o. female seen today for [name] . she presents today for routine electrophysiology followup.  Since last being seen in our clinic, the patient reports doing well.  No further syncope since fall of last year or earlier. She had a fall today in the parking lot. Doesn't use a cane or walker. Denies LOC. she denies chest pain, palpitations, dyspnea, PND, orthopnea, nausea, vomiting, dizziness, syncope, edema, weight gain, or early satiety.  Device History: MDT ILR implanted 07/31/2016,cryptogenic stroke.  EOS   Past Medical History:  Diagnosis Date   Allergic rhinitis, cause unspecified    Anxiety state, unspecified    Arthritis    Colon polyp    Diaphragmatic hernia without mention of obstruction or gangrene    Diverticulosis of colon (without mention of hemorrhage)    Female stress incontinence    HTN (hypertension)    Insomnia, unspecified    Interstitial cystitis    surgery 02/01/2013   Irritable bowel syndrome    Memory loss    Other and unspecified hyperlipidemia    Other premature beats    Polyneuropathy in diabetes(357.2)    Postmenopausal bleeding    Proteinuria    Rectal ulcer    Skin cancer    Stroke ([place]) 2018   Type II or unspecified type diabetes mellitus with neurological manifestations, not stated as uncontrolled(250.60)    Unspecified essential hypertension    Viral warts, unspecified    Past Surgical History:  Procedure Laterality Date   ABDOMINAL HYSTERECTOMY  1977   [place]   CATARACT EXTRACTION, BILATERAL  02/10/2006   Dr.Groat    COLONOSCOPY  07/17/2004   polypectomy   CYSTOSCOPY  1987   Dr.Bradley   LOOP RECORDER INSERTION N/A 07/31/2016   Procedure: Loop Recorder  Insertion;  Surgeon: [name], MD;  Location: [facility];  Service: Cardiovascular;  Laterality: N/A;   TEE WITHOUT CARDIOVERSION N/A 07/30/2016   Procedure: TRANSESOPHAGEAL ECHOCARDIOGRAM (TEE);  Surgeon: [name], MD;  Location: [facility];  Service: Cardiovascular;  Laterality: N/A;   TRIGGER FINGER RELEASE  1990   [place]  05/13/16   urinary bladder      surgery 02/01/2013 to stretch bladder stem    Current Outpatient Medications  Medication Sig Dispense Refill   ACCU-CHEK AVIVA PLUS test strip USE TO TEST BLOOD SUGAR TWICE DAILY. 100 each 11   amLODipine (NORVASC) 2.5 MG tablet TAKE 1 TABLET TWICE A DAY 180 tablet 1   apixaban (ELIQUIS) 2.5 MG TABS tablet Take 1 tablet (2.5 mg total) by mouth 2 (two) times daily. 60 tablet 0   atorvastatin (LIPITOR) 40 MG tablet TAKE 1 TABLET DAILY AT 6 P.M. 90 tablet 3   citalopram (CELEXA) 10 MG tablet TAKE 1 TABLET DAILY 90 tablet 3   gabapentin (NEURONTIN) 300 MG capsule Take 1 capsule (300 mg total) by mouth 3 (three) times daily. 270 capsule 1   hyoscyamine (LEVSIN SL) 0.125 MG SL tablet Place 1 tablet (0.125 mg total) under the tongue every 4 (four) hours as needed. Take tablet every 4-6 hours for abdominal pain and cramping 15 tablet 2   Lancets (ACCU-CHEK MULTICLIX) lancets Use to test blood sugar twice daily.  E11.29 100 each 11   levocetirizine (XYZAL) 5 MG tablet TAKE 1 TABLET EVERY EVENING 90 tablet 3   omeprazole (PRILOSEC) 20 MG capsule TAKE 1 CAPSULE TWICE A DAY 180 capsule 3   ondansetron (ZOFRAN ODT) 4 MG disintegrating tablet Take 1 tablet (4 mg total) by mouth every 6 (six) hours. (Patient taking differently: Take 4 mg by mouth as needed.) 15 tablet 5   oxyCODONE (OXY IR/ROXICODONE) 5 MG immediate release tablet Take 1 tablet (5 mg total) by mouth every 4 (four) hours as needed for severe pain. 65 tablet 0   pioglitazone (ACTOS) 15 MG tablet TAKE 1 TABLET DAILY 90 tablet 3   triamcinolone  cream (KENALOG) 0.5 % Apply 1 application topically 2 (two) times daily. 454 g 1   vitamin B-12 (CYANOCOBALAMIN) 1000 MCG tablet Take 1,000 mcg by mouth daily.     Vitamin D, Ergocalciferol, (DRISDOL) 1.25 MG (50000 UNIT) CAPS capsule TAKE 1 CAPSULE ONCE WEEKLY FOR VITAMIN D 12 capsule 3   No current facility-administered medications for this visit.    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   Tobacco Use   Smoking status: Former    Packs/day: 0.25    Years: 62.00    Pack years: 15.50    Types: Cigarettes    Quit date: 04/02/2011    Years since quitting: 9.6   Smokeless tobacco: Never   Tobacco comments:    stopped consistently smoking in 2012 but still smokes occasionally (one pack per week)  Vaping Use   Vaping Use: Never used  Substance and Sexual Activity   Alcohol use: No   Drug use: No   Sexual activity: Not Currently  Other Topics Concern   Not on file  Social History Narrative   Ophthalmologist-Dr.Gould and Dr.Groat   Podiatrist- Dr.Tuchman   Dermatologist- Dr.Lupton   Urologist- Dr. Lawerance Bach    Lives with her son.    Social Determinants of Health   Financial Resource Strain: Low Risk    Difficulty of Paying Living Expenses: Not hard at all  Food Insecurity: No Food Insecurity   Worried About Charity fundraiser in the Last Year: Never true   [place] in the Last Year: Never true  Transportation Needs: No  Transportation Needs   Lack of Transportation (Medical): No   Lack of Transportation (Non-Medical): No  Physical Activity: Inactive   Days of Exercise per Week: 0 days   Minutes of Exercise per Session: 0 min  Stress: No Stress Concern Present   Feeling of Stress : Not at all  Social Connections: Moderately Integrated   Frequency of Communication with Friends and Family: More than three times a week   Frequency of Social Gatherings with Friends and Family: Three times a week   Attends Religious Services: More than 4 times per year   Active Member of Clubs or Organizations: No   Attends Archivist Meetings: More than 4 times per year   Marital Status: Widowed  Intimate Partner Violence: Not At Risk   Fear of Current or Ex-Partner: No   Emotionally Abused: No   Physically Abused: No   Sexually Abused: No    Family History: Family History  Problem Relation Age of Onset   Pancreatic cancer Mother 50   Heart attack Father    Bladder Cancer Father    Hypertension Sister    Diabetes Sister    COPD Sister    Hypertension Son    Diabetes Brother    Heart disease Brother    Hypertension Brother    Diabetes Brother    Heart disease Brother 67       Myocardial Infarction    Heart attack Brother      Review of Systems: All other systems reviewed and are otherwise negative except as noted above.  Physical Exam: Vitals:   11/06/20 0910  BP: 140/68  Pulse: 64  SpO2: 98%  Weight: 171 lb (77.6 kg)  Height: '5\' 6"'  (1.676 m)     GEN- The patient is well appearing, alert and oriented x 3 today.   HEENT: normocephalic, atraumatic; sclera clear, conjunctiva pink; hearing intact; oropharynx clear; neck supple  Lungs- Clear to ausculation bilaterally, normal work of breathing.  No wheezes, rales, rhonchi Heart- Regular rate and rhythm, no murmurs, rubs or gallops  GI- soft, non-tender, non-distended, bowel sounds present  Extremities- no clubbing, cyanosis, or edema  MS-  no significant deformity or atrophy Skin- warm and dry, no rash or lesion; PPM pocket well healed Psych- euthymic mood, full affect Neuro- strength and sensation are intact  PPM Interrogation- reviewed in detail today,  See PACEART report  EKG:  EKG is not ordered today. The ekg ordered 08/31/2020 shows NSR at 64 bpm  Recent Labs: 03/23/2020: ALT 10 07/19/2020: Hemoglobin 12.1; Platelets 167.0; TSH 4.00 08/31/2020: BUN 27; Creatinine, Ser 1.65; Potassium 4.6; Sodium 138   Wt Readings from Last 3 Encounters:  11/06/20 171 lb (77.6 kg)  09/25/20 168 lb (76.2 kg)  08/31/20 168 lb (76.2 kg)     Other studies Reviewed: Additional studies/ records that were reviewed today include: Previous EP office notes, Previous remote checks, Most recent labwork.   Assessment and Plan:  1. Cryptogenic Stroke s/p Medtronic Loop recorder PAF was identified. Loop has met ERI. Not able to activate today (EOS)  2. Paroxysmal AFib Continue eliquis for CHA2DS2Vasc of at least 8. Recent reduction to 2.5 mg BID.    3. Sinus node dysfunction With syncopal spell Nov/dec without even on loop transmission 12/24 (no brady or pause)  Continue to follow clinically.   No further syncope.   4. HTN Stable on current medications.   5. Chronic diastolic CHF   Volume status stable on exam.    6. Loud R carotid bruit Only mild plaque bilaterally.    7. CP Mostly atypical Consider stress testing if increased or more typical. Pt has refused for now.   Current medicines are reviewed at length with the patient today.   The patient does not have concerns regarding her medicines.  The following changes were made today:  none  Recent BMET 08/31/20 stable.  Disposition:   Follow up with [name] in 6 Months   Signed, Annamaria Helling  11/06/2020 9:31 AM  [facility] [address] [place] [address] [phone] (office) [phone] (fax)

## 2020-11-06 ENCOUNTER — Other Ambulatory Visit: Payer: Self-pay

## 2020-11-06 ENCOUNTER — Ambulatory Visit (INDEPENDENT_AMBULATORY_CARE_PROVIDER_SITE_OTHER): Payer: Medicare Other | Admitting: Student

## 2020-11-06 ENCOUNTER — Encounter: Payer: Self-pay | Admitting: Student

## 2020-11-06 ENCOUNTER — Encounter: Payer: Medicare Other | Admitting: Physician Assistant

## 2020-11-06 VITALS — BP 140/68 | HR 64 | Ht 66.0 in | Wt 171.0 lb

## 2020-11-06 DIAGNOSIS — I10 Essential (primary) hypertension: Secondary | ICD-10-CM

## 2020-11-06 DIAGNOSIS — I480 Paroxysmal atrial fibrillation: Secondary | ICD-10-CM | POA: Diagnosis not present

## 2020-11-06 DIAGNOSIS — I639 Cerebral infarction, unspecified: Secondary | ICD-10-CM

## 2020-11-06 DIAGNOSIS — I495 Sick sinus syndrome: Secondary | ICD-10-CM | POA: Diagnosis not present

## 2020-11-06 DIAGNOSIS — R079 Chest pain, unspecified: Secondary | ICD-10-CM

## 2020-11-06 DIAGNOSIS — R55 Syncope and collapse: Secondary | ICD-10-CM

## 2020-11-06 DIAGNOSIS — R0989 Other specified symptoms and signs involving the circulatory and respiratory systems: Secondary | ICD-10-CM

## 2020-11-06 NOTE — Patient Instructions (Signed)
Medication Instructions:  Your physician recommends that you continue on your current medications as directed. Please refer to the Current Medication list given to you today.  *If you need a refill on your cardiac medications before your next appointment, please call your pharmacy*   Lab Work: None If you have labs (blood work) drawn today and your tests are completely normal, you will receive your results only by: MyChart Message (if you have MyChart) OR A paper copy in the mail If you have any lab test that is abnormal or we need to change your treatment, we will call you to review the results.   Follow-Up: At [facility], you and your health needs are our priority.  As part of our continuing mission to provide you with exceptional heart care, we have created designated Provider Care Teams.  These Care Teams include your primary Cardiologist (physician) and Advanced Practice Providers (APPs -  Physician Assistants and Nurse Practitioners) who all work together to provide you with the care you need, when you need it.   Your next appointment:   6 month(s)  The format for your next appointment:   In Person  Provider:   You may see Gregg Taylor, MD or one of the following Advanced Practice Providers on your designated Care Team:   Renee Ursuy, PA-C Michael "Andy" Tillery, PA-C    

## 2020-11-09 ENCOUNTER — Ambulatory Visit: Payer: Medicare Other | Admitting: Family Medicine

## 2020-11-10 ENCOUNTER — Other Ambulatory Visit: Payer: Self-pay | Admitting: Family Medicine

## 2020-11-10 ENCOUNTER — Ambulatory Visit (HOSPITAL_BASED_OUTPATIENT_CLINIC_OR_DEPARTMENT_OTHER)
Admission: RE | Admit: 2020-11-10 | Discharge: 2020-11-10 | Disposition: A | Discharge status: Home / Self Care | Payer: Medicare Other | Source: Ambulatory Visit | Attending: Family Medicine | Admitting: Family Medicine

## 2020-11-10 ENCOUNTER — Other Ambulatory Visit: Payer: Self-pay

## 2020-11-10 ENCOUNTER — Ambulatory Visit (INDEPENDENT_AMBULATORY_CARE_PROVIDER_SITE_OTHER): Payer: Medicare Other | Admitting: Family Medicine

## 2020-11-10 ENCOUNTER — Encounter: Payer: Self-pay | Admitting: Family Medicine

## 2020-11-10 VITALS — BP 138/78 | HR 56 | Temp 98.3°F | Resp 18 | Ht 66.0 in

## 2020-11-10 DIAGNOSIS — M79605 Pain in left leg: Secondary | ICD-10-CM | POA: Insufficient documentation

## 2020-11-10 DIAGNOSIS — I639 Cerebral infarction, unspecified: Secondary | ICD-10-CM | POA: Diagnosis not present

## 2020-11-10 DIAGNOSIS — M25552 Pain in left hip: Secondary | ICD-10-CM | POA: Insufficient documentation

## 2020-11-10 DIAGNOSIS — R296 Repeated falls: Secondary | ICD-10-CM | POA: Diagnosis not present

## 2020-11-10 MED ORDER — NONFORMULARY OR COMPOUNDED ITEM: 0 refills | Status: DC

## 2020-11-10 MED ORDER — METHOCARBAMOL 500 MG PO TABS: 500.0000 mg | ORAL_TABLET | Freq: Four times a day (QID) | ORAL | 0 refills | Status: DC

## 2020-11-10 NOTE — Assessment & Plan Note (Signed)
Xray Muscle relaxer  Consider sport med if no improvement  F/u pcp

## 2020-11-10 NOTE — Progress Notes (Addendum)
 Subjective:   By signing my name below, I, Zite Okoli, attest that this documentation has been prepared under the direction and in the presence of  R Lowne Chase, DO. 11/10/2020   Patient ID: Penny Miller, female    DOB: 06/08/1931, 87 y.o.   MRN: [id number]  Chief Complaint  Patient presents with   Fall    Pt states fall was on Monday and states having left hip pain and left hand pain.     HPI Patient is in today for an office visit to discuss her hip pain. She is accompanied by her son today.  She reports that on Monday while she was walking, she couldn't move and fell on the floor. She fell on her left hip and twisted her left leg but her son held her from falling face-down. She reports that she was okay for the rest of the day and Tuesday morning and had no problems walking.  On Tuesday night, she mentions that the pain got worse and on Wednesday, she could not put any weight on the leg at all. She walks with her right leg and has to drag the left leg forward. She mentions the pain radiates down to her calf but she can still move her foot easily.She has been managing the pain with gabapentin which she mentions has been providing moderate relief.  She is requesting a better wheelchair to replace her walker.  Past Medical History:  Diagnosis Date   Allergic rhinitis, cause unspecified    Anxiety state, unspecified    Arthritis    Colon polyp    Diaphragmatic hernia without mention of obstruction or gangrene    Diverticulosis of colon (without mention of hemorrhage)    Female stress incontinence    HTN (hypertension)    Insomnia, unspecified    Interstitial cystitis    surgery 02/01/2013   Irritable bowel syndrome    Memory loss    Other and unspecified hyperlipidemia    Other premature beats    Polyneuropathy in diabetes(357.2)    Postmenopausal bleeding    Proteinuria    Rectal ulcer    Skin cancer    Stroke (HCC) 2018   Type II or unspecified type diabetes  mellitus with neurological manifestations, not stated as uncontrolled(250.60)    Unspecified essential hypertension    Viral warts, unspecified     Past Surgical History:  Procedure Laterality Date   ABDOMINAL HYSTERECTOMY  1977   Dr.Hambright   APPENDECTOMY  1965   CATARACT EXTRACTION, BILATERAL  02/10/2006   Dr.Groat    COLONOSCOPY  07/17/2004   polypectomy   CYSTOSCOPY  1987   Dr.Bradley   LOOP RECORDER INSERTION N/A 07/31/2016   Procedure: Loop Recorder Insertion;  Surgeon: Gregg W Taylor, MD;  Location: [facility];  Service: Cardiovascular;  Laterality: N/A;   TEE WITHOUT CARDIOVERSION N/A 07/30/2016   Procedure: TRANSESOPHAGEAL ECHOCARDIOGRAM (TEE);  Surgeon: Peter C Nishan, MD;  Location: [facility];  Service: Cardiovascular;  Laterality: N/A;   TRIGGER FINGER RELEASE  1990   Dr.Carter   TRIGGER FINGER RELEASE  05/13/16   urinary bladder      surgery 02/01/2013 to stretch bladder stem    Family History  Problem Relation Age of Onset   Pancreatic cancer Mother 62   Heart attack Father    Bladder Cancer Father    Hypertension Sister    Diabetes Sister    COPD Sister    Hypertension Son    Diabetes Brother      Heart disease Brother    Hypertension Brother    Diabetes Brother    Heart disease Brother 75       Myocardial Infarction    Heart attack Brother     Social History   Socioeconomic History   Marital status: Widowed    Spouse name: Not on file   Number of children: 1   Years of education: Not on file   Highest education level: Not on file  Occupational History   Occupation: Teacher retired    Comment: Piano Teacher   Tobacco Use   Smoking status: Former    Packs/day: 0.25    Years: 62.00    Pack years: 15.50    Types: Cigarettes    Quit date: 04/02/2011    Years since quitting: 9.6   Smokeless tobacco: Never   Tobacco comments:    stopped consistently smoking in 2012 but still smokes occasionally (one pack per week)  Vaping Use   Vaping  Use: Never used  Substance and Sexual Activity   Alcohol use: No   Drug use: No   Sexual activity: Not Currently  Other Topics Concern   Not on file  Social History Narrative   Ophthalmologist-Dr.Gould and Dr.Groat   Podiatrist- Dr.Tuchman   Dermatologist- Dr.Lupton   Urologist- Dr. Ron Pridgen    Lives with her son.    Social Determinants of Health   Financial Resource Strain: Low Risk    Difficulty of Paying Living Expenses: Not hard at all  Food Insecurity: No Food Insecurity   Worried About Running Out of Food in the Last Year: Never true   Ran Out of Food in the Last Year: Never true  Transportation Needs: No Transportation Needs   Lack of Transportation (Medical): No   Lack of Transportation (Non-Medical): No  Physical Activity: Inactive   Days of Exercise per Week: 0 days   Minutes of Exercise per Session: 0 min  Stress: No Stress Concern Present   Feeling of Stress : Not at all  Social Connections: Moderately Integrated   Frequency of Communication with Friends and Family: More than three times a week   Frequency of Social Gatherings with Friends and Family: Three times a week   Attends Religious Services: More than 4 times per year   Active Member of Clubs or Organizations: No   Attends Club or Organization Meetings: More than 4 times per year   Marital Status: Widowed  Intimate Partner Violence: Not At Risk   Fear of Current or Ex-Partner: No   Emotionally Abused: No   Physically Abused: No   Sexually Abused: No    Outpatient Medications Prior to Visit  Medication Sig Dispense Refill   ACCU-CHEK AVIVA PLUS test strip USE TO TEST BLOOD SUGAR TWICE DAILY. 100 each 11   amLODipine (NORVASC) 2.5 MG tablet TAKE 1 TABLET TWICE A DAY 180 tablet 1   apixaban (ELIQUIS) 2.5 MG TABS tablet Take 1 tablet (2.5 mg total) by mouth 2 (two) times daily. 60 tablet 0   atorvastatin (LIPITOR) 40 MG tablet TAKE 1 TABLET DAILY AT 6 P.M. 90 tablet 3   citalopram (CELEXA) 10 MG  tablet TAKE 1 TABLET DAILY 90 tablet 3   gabapentin (NEURONTIN) 300 MG capsule Take 1 capsule (300 mg total) by mouth 3 (three) times daily. 270 capsule 1   hyoscyamine (LEVSIN SL) 0.125 MG SL tablet Place 1 tablet (0.125 mg total) under the tongue every 4 (four) hours as needed. Take tablet every 4-6 hours   for abdominal pain and cramping 15 tablet 2   Lancets (ACCU-CHEK MULTICLIX) lancets Use to test blood sugar twice daily. E11.29 100 each 11   levocetirizine (XYZAL) 5 MG tablet TAKE 1 TABLET EVERY EVENING 90 tablet 3   omeprazole (PRILOSEC) 20 MG capsule TAKE 1 CAPSULE TWICE A DAY 180 capsule 3   ondansetron (ZOFRAN ODT) 4 MG disintegrating tablet Take 1 tablet (4 mg total) by mouth every 6 (six) hours. (Patient taking differently: Take 4 mg by mouth as needed.) 15 tablet 5   pioglitazone (ACTOS) 15 MG tablet TAKE 1 TABLET DAILY 90 tablet 3   triamcinolone cream (KENALOG) 0.5 % Apply 1 application topically 2 (two) times daily. 454 g 1   vitamin B-12 (CYANOCOBALAMIN) 1000 MCG tablet Take 1,000 mcg by mouth daily.     Vitamin D, Ergocalciferol, (DRISDOL) 1.25 MG (50000 UNIT) CAPS capsule TAKE 1 CAPSULE ONCE WEEKLY FOR VITAMIN D 12 capsule 3   oxyCODONE (OXY IR/ROXICODONE) 5 MG immediate release tablet Take 1 tablet (5 mg total) by mouth every 4 (four) hours as needed for severe pain. 65 tablet 0   No facility-administered medications prior to visit.    Allergies  Allergen Reactions   Jardiance [Empagliflozin] Diarrhea   Metformin And Related Diarrhea   Ceclor [Cefaclor] Other (See Comments)    Unknown reaction; not recalled   Codeine Nausea And Vomiting   Lopid [Gemfibrozil] Other (See Comments)    Unknown reaction; not recalled   Metoprolol Nausea And Vomiting   Morphine And Related Other (See Comments)    Passed out   Niacin Nausea And Vomiting    Unknown reaction Unknown reaction; not recalled   Niacin And Related Other (See Comments)    Unknown reaction; not recalled   Relafen  [Nabumetone] Nausea And Vomiting   Septra [Sulfamethoxazole-Trimethoprim] Nausea And Vomiting   Tetracycline Nausea And Vomiting   Toprol Xl [Metoprolol Tartrate] Nausea And Vomiting   Adhesive [Tape] Rash and Other (See Comments)    Tears the skin also    Review of Systems  Constitutional:  Negative for fever and malaise/fatigue.  HENT:  Negative for congestion.   Eyes:  Negative for blurred vision.  Respiratory:  Negative for cough and shortness of breath.   Cardiovascular:  Negative for chest pain, palpitations and leg swelling.  Gastrointestinal:  Negative for vomiting.  Musculoskeletal:  Positive for falls, joint pain (hip) and myalgias (left leg). Negative for back pain.  Skin:  Negative for rash.  Neurological:  Negative for loss of consciousness and headaches.      Objective:    Physical Exam Vitals and nursing note reviewed.  Constitutional:      General: She is not in acute distress.    Appearance: Normal appearance. She is not ill-appearing.  HENT:     Head: Normocephalic and atraumatic.     Right Ear: External ear normal.     Left Ear: External ear normal.  Eyes:     Extraocular Movements: Extraocular movements intact.     Pupils: Pupils are equal, round, and reactive to light.  Cardiovascular:     Rate and Rhythm: Normal rate and regular rhythm.     Pulses: Normal pulses.     Heart sounds: Normal heart sounds. No murmur heard.   No gallop.  Pulmonary:     Effort: Pulmonary effort is normal. No respiratory distress.     Breath sounds: Normal breath sounds. No wheezing, rhonchi or rales.  Abdominal:     General: Bowel  sounds are normal. There is no distension.     Palpations: Abdomen is soft. There is no mass.     Tenderness: There is no abdominal tenderness. There is no guarding or rebound.     Hernia: No hernia is present.  Musculoskeletal:     Cervical back: Normal range of motion and neck supple.  Lymphadenopathy:     Cervical: No cervical adenopathy.   Skin:    General: Skin is warm and dry.  Neurological:     Mental Status: She is alert and oriented to person, place, and time.  Psychiatric:        Behavior: Behavior normal.    BP 138/78 (BP Location: Left Arm, Patient Position: Sitting, Cuff Size: Normal)   Pulse (!) 56   Temp 98.3 F (36.8 C) (Oral)   Resp 18   Ht 5' 6" (1.676 m)   SpO2 95%   BMI 27.60 kg/m  Wt Readings from Last 3 Encounters:  11/06/20 171 lb (77.6 kg)  09/25/20 168 lb (76.2 kg)  08/31/20 168 lb (76.2 kg)    Diabetic Foot Exam - Simple   No data filed    Lab Results  Component Value Date   WBC 8.9 07/19/2020   HGB 12.1 07/19/2020   HCT 36.2 07/19/2020   PLT 167.0 07/19/2020   GLUCOSE 84 08/31/2020   CHOL 160 08/09/2019   TRIG 161 (H) 08/09/2019   HDL 51 08/09/2019   LDLCALC 83 08/09/2019   ALT 10 03/23/2020   AST 14 03/23/2020   NA 138 08/31/2020   K 4.6 08/31/2020   CL 102 08/31/2020   CREATININE 1.65 (H) 08/31/2020   BUN 27 08/31/2020   CO2 22 08/31/2020   TSH 4.00 07/19/2020   INR 1.06 07/08/2016   HGBA1C 6.7 (H) 07/19/2020   MICROALBUR 9.8 08/09/2019    Lab Results  Component Value Date   TSH 4.00 07/19/2020   Lab Results  Component Value Date   WBC 8.9 07/19/2020   HGB 12.1 07/19/2020   HCT 36.2 07/19/2020   MCV 93.7 07/19/2020   PLT 167.0 07/19/2020   Lab Results  Component Value Date   NA 138 08/31/2020   K 4.6 08/31/2020   CO2 22 08/31/2020   GLUCOSE 84 08/31/2020   BUN 27 08/31/2020   CREATININE 1.65 (H) 08/31/2020   BILITOT 0.4 03/23/2020   ALKPHOS 73 03/23/2020   AST 14 03/23/2020   ALT 10 03/23/2020   PROT 6.5 03/23/2020   ALBUMIN 3.9 03/23/2020   CALCIUM 9.3 08/31/2020   ANIONGAP 6 02/05/2018   EGFR 30 (L) 08/31/2020   GFR 28.11 (L) 07/19/2020   Lab Results  Component Value Date   CHOL 160 08/09/2019   Lab Results  Component Value Date   HDL 51 08/09/2019   Lab Results  Component Value Date   LDLCALC 83 08/09/2019   Lab Results   Component Value Date   TRIG 161 (H) 08/09/2019   Lab Results  Component Value Date   CHOLHDL 3.1 08/09/2019   Lab Results  Component Value Date   HGBA1C 6.7 (H) 07/19/2020       Assessment & Plan:   Problem List Items Addressed This Visit       Unprioritized   Frequent falls    Pt advised to use walker --- rx given for rollator  Consider PT-- she will discuss with pcp      Relevant Medications   NONFORMULARY OR COMPOUNDED ITEM   Left hip pain -   Primary    Most likely MS pain ---  Pt is allergic to pain meds ---  Muscle relaxer prescribed  Check xray --- consider sport med       Relevant Medications   NONFORMULARY OR COMPOUNDED ITEM   methocarbamol (ROBAXIN) 500 MG tablet   Other Relevant Orders   DG FEMUR MIN 2 VIEWS LEFT (Completed)   Left leg pain    Xray Muscle relaxer  Consider sport med if no improvement  F/u pcp       Relevant Medications   NONFORMULARY OR COMPOUNDED ITEM   methocarbamol (ROBAXIN) 500 MG tablet   Other Relevant Orders   DG FEMUR MIN 2 VIEWS LEFT (Completed)    Meds ordered this encounter  Medications   NONFORMULARY OR COMPOUNDED ITEM    Sig: Transport chair   #1    dx leg and hip pain s/p fall    Dispense:  1 each    Refill:  0   NONFORMULARY OR COMPOUNDED ITEM    Sig: Rollator walker with seat    #1   dx gait abnormality    Dispense:  1 each    Refill:  0   methocarbamol (ROBAXIN) 500 MG tablet    Sig: Take 1 tablet (500 mg total) by mouth 4 (four) times daily.    Dispense:  45 tablet    Refill:  0    I,Zite Okoli,acting as a scribe for [employer], DO.,have documented all relevant documentation on the behalf of Ann Held, DO,as directed by  Ann Held, DO while in the presence of Ann Held, [place], DO., personally preformed the services described in this documentation.  All medical record entries made by the scribe were at my direction and in my presence.  I  have reviewed the chart and discharge instructions (if applicable) and agree that the record reflects my personal performance and is accurate and complete. 11/10/2020

## 2020-11-10 NOTE — Assessment & Plan Note (Signed)
Pt advised to use walker --- rx given for rollator  Consider PT-- she will discuss with pcp

## 2020-11-10 NOTE — Assessment & Plan Note (Signed)
Most likely MS pain ---  Pt is allergic to pain meds ---  Muscle relaxer prescribed  Check xray --- consider sport med

## 2020-11-10 NOTE — Patient Instructions (Signed)
Understanding Your Risk for Falls Each year, millions of people have serious injuries from falls. It is important to understand your risk for falling. Talk with your health care provider about your risk and what you can do to lower it. There are actions you can take athome to lower your risk and prevent falls. If you do have a serious fall, make sure to tell your health care provider.Falling once raises your risk of falling again. How can falls affect me? Serious injuries from falls are common. These include: Broken bones, such as hip fractures. Head injuries, such as traumatic brain injuries (TBI) or concussion. A fear of falling can cause you to avoid activities and stay at home. This canmake your muscles weaker and actually raise your risk for a fall. What can increase my risk? There are a number of risk factors that increase your risk for falling. The more risk factors you have, the higher your risk of falling. Serious injuries from a fall happen most often to people older than age 65. Children and youngadults ages 15-29 are also at higher risk. Common risk factors include: Weakness in the lower body. Lack (deficiency) of vitamin D. Being generally weak or confused due to long-term (chronic) illness. Dizziness or balance problems. Poor vision. Medicines that cause dizziness or drowsiness. These can include medicines for your blood pressure, heart, anxiety, insomnia, or edema, as well as pain medicines and muscle relaxants. Other risk factors include: Drinking alcohol. Having had a fall in the past. Having depression. Having foot pain or wearing improper footwear. Working at a dangerous job. Having any of the following in your home: Tripping hazards, such as floor clutter or loose rugs. Poor lighting. Pets. Having dementia or memory loss. What actions can I take to lower my risk of falling?     Physical activity Maintain physical fitness. Do strength and balance exercises.  Consider taking aregular class to build strength and balance. Yoga and tai chi are good options. Vision Have your eyes checked every year and your vision prescription updated asneeded. Walking aids and footwear Wear nonskid shoes. Do not wear high heels. Do not walk around the house in socks or slippers. Use a cane or walker as told by your health care provider. Home safety Attach secure railings on both sides of your stairs. Install grab bars for your tub, shower, and toilet. Use a bath mat in your tub or shower. Use good lighting in all rooms. Keep a flashlight near your bed. Make sure there is a clear path from your bed to the bathroom. Use night-lights. Do not use throw rugs. Make sure all carpeting is taped or tacked down securely. Remove all clutter from walkways and stairways, including extension cords. Repair uneven or broken steps. Avoid walking on icy or slippery surfaces. Walk on the grass instead of on icy or slick sidewalks. Use ice melt to get rid of ice on walkways. Use a cordless phone. Questions to ask your health care provider Can you help me check my risk for a fall? Do any of my medicines make me more likely to fall? Should I take a vitamin D supplement? What exercises can I do to improve my strength and balance? Should I make an appointment to have my vision checked? Do I need a bone density test to check for weak bones or osteoporosis? Would it help to use a cane or a walker? Where to find more information Centers for Disease Control and Prevention, [employer]: www.cdc.gov Community-Based Fall Prevention Programs:   www.cdc.gov National Institute on Aging: www.nia.nih.gov Contact a health care provider if: You fall at home. You are afraid of falling at home. You feel weak, drowsy, or dizzy. Summary Serious injuries from a fall happen most often to people older than age 65. Children and young adults ages 15-29 are also at higher risk. Talk with your health care  provider about your risks for falling and how to lower those risks. Taking certain precautions at home can lower your risk for falling. If you fall, always tell your health care provider. This information is not intended to replace advice given to you by your health care provider. Make sure you discuss any questions you have with your healthcare provider. Document Revised: 10/20/2019 Document Reviewed: 10/20/2019 Elsevier Patient Education  2022 Elsevier Inc.  

## 2020-11-17 ENCOUNTER — Ambulatory Visit: Payer: Medicare Other | Admitting: Podiatry

## 2020-11-17 ENCOUNTER — Telehealth: Payer: Self-pay

## 2020-11-17 NOTE — Telephone Encounter (Signed)
Stephanie from [facility] is calling to inquire about pts orders for a wheelchair. She states the fax was sent two days ago.  TEL: [phone].

## 2020-11-20 ENCOUNTER — Ambulatory Visit (INDEPENDENT_AMBULATORY_CARE_PROVIDER_SITE_OTHER): Payer: Medicare Other | Admitting: Internal Medicine

## 2020-11-20 ENCOUNTER — Encounter: Payer: Self-pay | Admitting: Internal Medicine

## 2020-11-20 ENCOUNTER — Other Ambulatory Visit: Payer: Self-pay

## 2020-11-20 ENCOUNTER — Emergency Department (HOSPITAL_COMMUNITY)
Admission: EM | Admit: 2020-11-20 | Discharge: 2020-11-21 | Disposition: A | Discharge status: Home / Self Care | Payer: Medicare Other | Attending: Emergency Medicine | Admitting: Emergency Medicine

## 2020-11-20 ENCOUNTER — Emergency Department (HOSPITAL_COMMUNITY): Payer: Medicare Other

## 2020-11-20 ENCOUNTER — Ambulatory Visit (INDEPENDENT_AMBULATORY_CARE_PROVIDER_SITE_OTHER): Payer: Medicare Other

## 2020-11-20 VITALS — BP 138/70 | HR 61 | Temp 97.8°F | Ht 66.0 in | Wt 171.0 lb

## 2020-11-20 DIAGNOSIS — W19XXXA Unspecified fall, initial encounter: Secondary | ICD-10-CM

## 2020-11-20 DIAGNOSIS — T1490XA Injury, unspecified, initial encounter: Secondary | ICD-10-CM

## 2020-11-20 DIAGNOSIS — M47814 Spondylosis without myelopathy or radiculopathy, thoracic region: Secondary | ICD-10-CM | POA: Diagnosis not present

## 2020-11-20 DIAGNOSIS — N184 Chronic kidney disease, stage 4 (severe): Secondary | ICD-10-CM | POA: Diagnosis not present

## 2020-11-20 DIAGNOSIS — Z7901 Long term (current) use of anticoagulants: Secondary | ICD-10-CM | POA: Insufficient documentation

## 2020-11-20 DIAGNOSIS — R6889 Other general symptoms and signs: Secondary | ICD-10-CM | POA: Diagnosis not present

## 2020-11-20 DIAGNOSIS — E039 Hypothyroidism, unspecified: Secondary | ICD-10-CM | POA: Insufficient documentation

## 2020-11-20 DIAGNOSIS — Z85828 Personal history of other malignant neoplasm of skin: Secondary | ICD-10-CM | POA: Diagnosis not present

## 2020-11-20 DIAGNOSIS — M48062 Spinal stenosis, lumbar region with neurogenic claudication: Secondary | ICD-10-CM

## 2020-11-20 DIAGNOSIS — Z043 Encounter for examination and observation following other accident: Secondary | ICD-10-CM | POA: Diagnosis not present

## 2020-11-20 DIAGNOSIS — S0031XA Abrasion of nose, initial encounter: Secondary | ICD-10-CM | POA: Insufficient documentation

## 2020-11-20 DIAGNOSIS — R296 Repeated falls: Secondary | ICD-10-CM | POA: Diagnosis present

## 2020-11-20 DIAGNOSIS — S80211A Abrasion, right knee, initial encounter: Secondary | ICD-10-CM | POA: Diagnosis not present

## 2020-11-20 DIAGNOSIS — I6782 Cerebral ischemia: Secondary | ICD-10-CM | POA: Diagnosis not present

## 2020-11-20 DIAGNOSIS — S0081XA Abrasion of other part of head, initial encounter: Secondary | ICD-10-CM | POA: Diagnosis not present

## 2020-11-20 DIAGNOSIS — R911 Solitary pulmonary nodule: Secondary | ICD-10-CM | POA: Diagnosis not present

## 2020-11-20 DIAGNOSIS — M5442 Lumbago with sciatica, left side: Secondary | ICD-10-CM

## 2020-11-20 DIAGNOSIS — R918 Other nonspecific abnormal finding of lung field: Secondary | ICD-10-CM | POA: Insufficient documentation

## 2020-11-20 DIAGNOSIS — M2578 Osteophyte, vertebrae: Secondary | ICD-10-CM | POA: Diagnosis not present

## 2020-11-20 DIAGNOSIS — S3993XA Unspecified injury of pelvis, initial encounter: Secondary | ICD-10-CM | POA: Diagnosis not present

## 2020-11-20 DIAGNOSIS — Z20822 Contact with and (suspected) exposure to covid-19: Secondary | ICD-10-CM | POA: Insufficient documentation

## 2020-11-20 DIAGNOSIS — Z79899 Other long term (current) drug therapy: Secondary | ICD-10-CM | POA: Insufficient documentation

## 2020-11-20 DIAGNOSIS — E1122 Type 2 diabetes mellitus with diabetic chronic kidney disease: Secondary | ICD-10-CM | POA: Insufficient documentation

## 2020-11-20 DIAGNOSIS — I700 Atherosclerosis of aorta: Secondary | ICD-10-CM | POA: Diagnosis not present

## 2020-11-20 DIAGNOSIS — Z9181 History of falling: Secondary | ICD-10-CM | POA: Insufficient documentation

## 2020-11-20 DIAGNOSIS — R404 Transient alteration of awareness: Secondary | ICD-10-CM | POA: Diagnosis not present

## 2020-11-20 DIAGNOSIS — S0993XA Unspecified injury of face, initial encounter: Secondary | ICD-10-CM | POA: Diagnosis not present

## 2020-11-20 DIAGNOSIS — I639 Cerebral infarction, unspecified: Secondary | ICD-10-CM | POA: Diagnosis not present

## 2020-11-20 DIAGNOSIS — Z9989 Dependence on other enabling machines and devices: Secondary | ICD-10-CM | POA: Diagnosis not present

## 2020-11-20 DIAGNOSIS — W01198A Fall on same level from slipping, tripping and stumbling with subsequent striking against other object, initial encounter: Secondary | ICD-10-CM | POA: Diagnosis not present

## 2020-11-20 DIAGNOSIS — M25552 Pain in left hip: Secondary | ICD-10-CM | POA: Diagnosis not present

## 2020-11-20 DIAGNOSIS — R29898 Other symptoms and signs involving the musculoskeletal system: Secondary | ICD-10-CM | POA: Diagnosis not present

## 2020-11-20 DIAGNOSIS — R0902 Hypoxemia: Secondary | ICD-10-CM | POA: Diagnosis not present

## 2020-11-20 DIAGNOSIS — Z743 Need for continuous supervision: Secondary | ICD-10-CM | POA: Diagnosis not present

## 2020-11-20 DIAGNOSIS — S199XXA Unspecified injury of neck, initial encounter: Secondary | ICD-10-CM | POA: Diagnosis not present

## 2020-11-20 DIAGNOSIS — E1142 Type 2 diabetes mellitus with diabetic polyneuropathy: Secondary | ICD-10-CM | POA: Insufficient documentation

## 2020-11-20 DIAGNOSIS — I129 Hypertensive chronic kidney disease with stage 1 through stage 4 chronic kidney disease, or unspecified chronic kidney disease: Secondary | ICD-10-CM | POA: Insufficient documentation

## 2020-11-20 DIAGNOSIS — M79652 Pain in left thigh: Secondary | ICD-10-CM | POA: Diagnosis not present

## 2020-11-20 DIAGNOSIS — Z87891 Personal history of nicotine dependence: Secondary | ICD-10-CM | POA: Insufficient documentation

## 2020-11-20 DIAGNOSIS — S33140A Subluxation of L4/L5 lumbar vertebra, initial encounter: Secondary | ICD-10-CM | POA: Diagnosis not present

## 2020-11-20 DIAGNOSIS — S0990XA Unspecified injury of head, initial encounter: Secondary | ICD-10-CM | POA: Diagnosis not present

## 2020-11-20 DIAGNOSIS — S299XXA Unspecified injury of thorax, initial encounter: Secondary | ICD-10-CM | POA: Diagnosis not present

## 2020-11-20 LAB — COMPREHENSIVE METABOLIC PANEL
ALT: 11 U/L (ref 0–44)
AST: 23 U/L (ref 15–41)
Albumin: 3.7 g/dL (ref 3.5–5.0)
Alkaline Phosphatase: 115 U/L (ref 38–126)
Anion gap: 9 (ref 5–15)
BUN: 21 mg/dL (ref 8–23)
CO2: 24 mmol/L (ref 22–32)
Calcium: 9.7 mg/dL (ref 8.9–10.3)
Chloride: 105 mmol/L (ref 98–111)
Creatinine, Ser: 1.51 mg/dL — ABNORMAL HIGH (ref 0.44–1.00)
GFR, Estimated: 33 mL/min — ABNORMAL LOW (ref >60)
Glucose, Bld: 87 mg/dL (ref 70–99)
Potassium: 4.7 mmol/L (ref 3.5–5.1)
Sodium: 138 mmol/L (ref 135–145)
Total Bilirubin: 1.3 mg/dL — ABNORMAL HIGH (ref 0.3–1.2)
Total Protein: 6.8 g/dL (ref 6.5–8.1)

## 2020-11-20 LAB — CBC
HCT: 39.8 % (ref 36.0–46.0)
Hemoglobin: 12.8 g/dL (ref 12.0–15.0)
MCH: 29.7 pg (ref 26.0–34.0)
MCHC: 32.2 g/dL (ref 30.0–36.0)
MCV: 92.3 fL (ref 80.0–100.0)
Platelets: 220 10*3/uL (ref 150–400)
RBC: 4.31 MIL/uL (ref 3.87–5.11)
RDW: 15.4 % (ref 11.5–15.5)
WBC: 9.6 10*3/uL (ref 4.0–10.5)
nRBC: 0 % (ref 0.0–0.2)

## 2020-11-20 LAB — SAMPLE TO BLOOD BANK

## 2020-11-20 LAB — URINALYSIS, ROUTINE W REFLEX MICROSCOPIC
Bacteria, UA: NONE SEEN
Bilirubin Urine: NEGATIVE
Glucose, UA: NEGATIVE mg/dL
Ketones, ur: NEGATIVE mg/dL
Leukocytes,Ua: NEGATIVE
Nitrite: NEGATIVE
Protein, ur: NEGATIVE mg/dL
Specific Gravity, Urine: 1.006 (ref 1.005–1.030)
pH: 7 (ref 5.0–8.0)

## 2020-11-20 LAB — I-STAT CHEM 8, ED
BUN: 28 mg/dL — ABNORMAL HIGH (ref 8–23)
Calcium, Ion: 1.07 mmol/L — ABNORMAL LOW (ref 1.15–1.40)
Chloride: 108 mmol/L (ref 98–111)
Creatinine, Ser: 1.4 mg/dL — ABNORMAL HIGH (ref 0.44–1.00)
Glucose, Bld: 87 mg/dL (ref 70–99)
HCT: 38 % (ref 36.0–46.0)
Hemoglobin: 12.9 g/dL (ref 12.0–15.0)
Potassium: 4.7 mmol/L (ref 3.5–5.1)
Sodium: 139 mmol/L (ref 135–145)
TCO2: 24 mmol/L (ref 22–32)

## 2020-11-20 LAB — PROTIME-INR
INR: 1 (ref 0.8–1.2)
Prothrombin Time: 13.3 seconds (ref 11.4–15.2)

## 2020-11-20 LAB — RESP PANEL BY RT-PCR (FLU A&B, COVID) ARPGX2
Influenza A by PCR: NEGATIVE
Influenza B by PCR: NEGATIVE
SARS Coronavirus 2 by RT PCR: NEGATIVE

## 2020-11-20 LAB — ETHANOL: Alcohol, Ethyl (B): <10 mg/dL (ref <10)

## 2020-11-20 LAB — LACTIC ACID, PLASMA: Lactic Acid, Venous: 1.2 mmol/L (ref 0.5–1.9)

## 2020-11-20 NOTE — ED Notes (Signed)
Pt transported to CT by TRN 

## 2020-11-20 NOTE — Progress Notes (Signed)
Orthopedic Tech Progress Note Patient Details:  Penny Miller 1932/01/16 [id number]  Level 2 trauma  Patient ID: [name], female   DOB: [birth year], 87 y.o.   MRN: [id number]  [place] 11/20/2020, 7:00 PM

## 2020-11-20 NOTE — Discharge Instructions (Addendum)
Please follow up with your PCP. Apply Band-Aids or gauze to your nose abrasion

## 2020-11-20 NOTE — ED Triage Notes (Signed)
Pt arrived to ED via EMS as a Level 2 Fall on thinners (Eliquis). Pt was getting out of her car when she had a mechanical fall and fell prone onto concrete, no LOC, has lack to mid forehead to bridge of nose w/ controlled bleeding. Pt fell 2 weeks ago and has had L leg numbness since. Pt c/o L hip pain r/t previous fall. Pt has abrasion to R knee. VSS w/ EMS other than pt being hypertensive at 194/94. Pt did not take meds this morning.

## 2020-11-20 NOTE — Progress Notes (Signed)
Subjective:  Patient ID: Penny Miller, female    DOB: [birth year]  Age: 87 y.o. MRN: [id number]  CC: Office Visit (Trouble walking and lifting leg after fall)  This visit occurred during the SARS-CoV-2 public health emergency.  Safety protocols were in place, including screening questions prior to the visit, additional usage of staff PPE, and extensive cleaning of exam room while observing appropriate contact time as indicated for disinfecting solutions.    HPI Penny Miller presents for f/up -  She recently fell and injured her left thigh.  She continues to complain of pain in the area.  She also has worsening left lower extremity weakness.  She is concerned that something has happened to her back or that she has had another stroke.  Outpatient Medications Prior to Visit  Medication Sig Dispense Refill   ACCU-CHEK AVIVA PLUS test strip USE TO TEST BLOOD SUGAR TWICE DAILY. 100 each 11   amLODipine (NORVASC) 2.5 MG tablet TAKE 1 TABLET TWICE A DAY 180 tablet 1   apixaban (ELIQUIS) 2.5 MG TABS tablet Take 1 tablet (2.5 mg total) by mouth 2 (two) times daily. 60 tablet 0   atorvastatin (LIPITOR) 40 MG tablet TAKE 1 TABLET DAILY AT 6 P.M. 90 tablet 3   citalopram (CELEXA) 10 MG tablet TAKE 1 TABLET DAILY 90 tablet 3   gabapentin (NEURONTIN) 300 MG capsule Take 1 capsule (300 mg total) by mouth 3 (three) times daily. 270 capsule 1   hyoscyamine (LEVSIN SL) 0.125 MG SL tablet Place 1 tablet (0.125 mg total) under the tongue every 4 (four) hours as needed. Take tablet every 4-6 hours for abdominal pain and cramping 15 tablet 2   Lancets (ACCU-CHEK MULTICLIX) lancets Use to test blood sugar twice daily. E11.29 100 each 11   levocetirizine (XYZAL) 5 MG tablet TAKE 1 TABLET EVERY EVENING 90 tablet 3   NONFORMULARY OR COMPOUNDED ITEM Transport chair   #1    dx leg and hip pain s/p fall 1 each 0   NONFORMULARY OR COMPOUNDED ITEM Rollator walker with seat    #1   dx gait abnormality 1 each 0    omeprazole (PRILOSEC) 20 MG capsule TAKE 1 CAPSULE TWICE A DAY 180 capsule 3   ondansetron (ZOFRAN ODT) 4 MG disintegrating tablet Take 1 tablet (4 mg total) by mouth every 6 (six) hours. (Patient taking differently: Take 4 mg by mouth as needed.) 15 tablet 5   pioglitazone (ACTOS) 15 MG tablet TAKE 1 TABLET DAILY 90 tablet 3   triamcinolone cream (KENALOG) 0.5 % Apply 1 application topically 2 (two) times daily. 454 g 1   vitamin B-12 (CYANOCOBALAMIN) 1000 MCG tablet Take 1,000 mcg by mouth daily.     Vitamin D, Ergocalciferol, (DRISDOL) 1.25 MG (50000 UNIT) CAPS capsule TAKE 1 CAPSULE ONCE WEEKLY FOR VITAMIN D 12 capsule 3   methocarbamol (ROBAXIN) 500 MG tablet Take 1 tablet (500 mg total) by mouth 4 (four) times daily. (Patient not taking: Reported on 11/20/2020) 45 tablet 0   No facility-administered medications prior to visit.    ROS Review of Systems  Constitutional: Negative.   HENT: Negative.    Eyes: Negative.   Respiratory:  Negative for cough and shortness of breath.   Cardiovascular:  Negative for chest pain, palpitations and leg swelling.  Gastrointestinal:  Negative for abdominal pain, diarrhea and nausea.  Endocrine: Negative.   Genitourinary: Negative.  Negative for difficulty urinating.  Musculoskeletal:  Positive for arthralgias, back pain and gait problem.  Negative for joint swelling.  Neurological:  Positive for weakness and numbness. Negative for dizziness and headaches.  Hematological:  Negative for adenopathy. Does not bruise/bleed easily.  Psychiatric/Behavioral: Negative.     Objective:  BP 138/70 (BP Location: Right Arm, Patient Position: Sitting, Cuff Size: Large)   Pulse 61   Temp 97.8 F (36.6 C) (Oral)   Ht 5\' 6"  (1.676 m)   Wt 171 lb (77.6 kg)   SpO2 99%   BMI 27.60 kg/m   BP Readings from Last 3 Encounters:  11/20/20 138/70  11/10/20 138/78  11/06/20 140/68    Wt Readings from Last 3 Encounters:  11/20/20 171 lb (77.6 kg)  11/06/20 171 lb  (77.6 kg)  09/25/20 168 lb (76.2 kg)    Physical Exam Vitals reviewed.  HENT:     Nose: Nose normal.     Mouth/Throat:     Mouth: Mucous membranes are moist.  Eyes:     General: No scleral icterus. Cardiovascular:     Rate and Rhythm: Normal rate and regular rhythm.     Heart sounds: No murmur heard. Pulmonary:     Effort: Pulmonary effort is normal.     Breath sounds: No stridor. No wheezing, rhonchi or rales.  Abdominal:     General: Abdomen is flat.  Musculoskeletal:        General: Normal range of motion.     Cervical back: Normal and neck supple.     Thoracic back: Normal.     Lumbar back: Normal.     Right upper leg: Normal. No swelling, deformity, tenderness or bony tenderness.     Left upper leg: Normal. No swelling, deformity, tenderness or bony tenderness.     Right lower leg: No swelling or deformity. No edema.     Left lower leg: No swelling or deformity. No edema.     Comments: Uses a 4-pronged walker  Lymphadenopathy:     Cervical: No cervical adenopathy.  Skin:    General: Skin is warm.  Neurological:     Mental Status: She is alert.     Cranial Nerves: Cranial nerves are intact.     Motor: Weakness present. No atrophy.     Coordination: Coordination is intact.     Gait: Gait abnormal.     Deep Tendon Reflexes:     Reflex Scores:      Tricep reflexes are 0 on the right side and 0 on the left side.      Bicep reflexes are 0 on the right side and 0 on the left side.      Brachioradialis reflexes are 0 on the right side and 0 on the left side.      Patellar reflexes are 0 on the right side and 0 on the left side.      Achilles reflexes are 0 on the right side and 0 on the left side.    Comments: LLE strength is 2/5    Lab Results  Component Value Date   WBC 8.9 07/19/2020   HGB 12.1 07/19/2020   HCT 36.2 07/19/2020   PLT 167.0 07/19/2020   GLUCOSE 84 08/31/2020   CHOL 160 08/09/2019   TRIG 161 (H) 08/09/2019   HDL 51 08/09/2019   LDLCALC 83  08/09/2019   ALT 10 03/23/2020   AST 14 03/23/2020   NA 138 08/31/2020   K 4.6 08/31/2020   CL 102 08/31/2020   CREATININE 1.65 (H) 08/31/2020   BUN 27 08/31/2020  CO2 22 08/31/2020   TSH 4.00 07/19/2020   INR 1.06 07/08/2016   HGBA1C 6.7 (H) 07/19/2020   MICROALBUR 9.8 08/09/2019    DG HIP UNILAT WITH PELVIS 2-3 VIEWS LEFT  Result Date: 11/10/2020 CLINICAL DATA:  Left hip pain status post fall EXAM: DG HIP (WITH OR WITHOUT PELVIS) 2-3V LEFT COMPARISON:  CT 02/05/2019 FINDINGS: SI joints are non widened. Pubic symphysis and rami appear intact. No acute displaced fracture or malalignment. Mild degenerative changes IMPRESSION: No acute osseous abnormality. Electronically Signed   By: Donavan Foil M.D.   On: 11/10/2020 15:55   DG FEMUR MIN 2 VIEWS LEFT  Result Date: 11/10/2020 CLINICAL DATA:  Fall with left leg pain EXAM: LEFT FEMUR 2 VIEWS COMPARISON:  None. FINDINGS: AP and lateral views of the shaft and distal femur. Proximal femur non included. No fracture or malalignment. Vascular calcifications. IMPRESSION: Negative. Electronically Signed   By: Donavan Foil M.D.   On: 11/10/2020 15:53    DG FEMUR MIN 2 VIEWS LEFT  Result Date: 11/20/2020 CLINICAL DATA:  Fall EXAM: LEFT FEMUR 2 VIEWS COMPARISON:  X-ray dated November 10 2020 FINDINGS: No acute fracture or dislocation. Vascular calcifications. Soft tissues otherwise unremarkable. IMPRESSION: No acute osseous abnormality. Electronically Signed   By: Yetta Glassman M.D.   On: 11/20/2020 15:59     Assessment & Plan:   Penny Miller was seen today for office visit.  Diagnoses and all orders for this visit:  Acute pain of left thigh- Plain films are negative for fracture. -     DG FEMUR MIN 2 VIEWS LEFT; Future  Weakness of left leg- Will get an MRI of the brain and the lumbar spine to try to identify the cause for the weakness. -     MR Brain Wo Contrast; Future -     MR Lumbar Spine Wo Contrast; Future  Acute left-sided low back  pain with left-sided sciatica -     MR Lumbar Spine Wo Contrast; Future  Spinal stenosis of lumbar region with neurogenic claudication -     MR Lumbar Spine Wo Contrast; Future  Spinal stenosis, lumbar region with neurogenic claudication -     MR Lumbar Spine Wo Contrast; Future  Chronic cerebral ischemia -     MR Brain Wo Contrast; Future  I am having Penny Miller maintain her accu-chek multiclix, Accu-Chek Aviva Plus, Vitamin D (Ergocalciferol), citalopram, pioglitazone, atorvastatin, vitamin B-12, triamcinolone cream, hyoscyamine, ondansetron, levocetirizine, omeprazole, apixaban, gabapentin, amLODipine, NONFORMULARY OR COMPOUNDED ITEM, NONFORMULARY OR COMPOUNDED ITEM, and methocarbamol.  No orders of the defined types were placed in this encounter.    Follow-up: No follow-ups on file.  Scarlette Calico, MD

## 2020-11-20 NOTE — Progress Notes (Signed)
   11/20/20 1749  Clinical Encounter Type  Visited With Patient  Visit Type Initial  Referral From Nurse  Consult/Referral To Chaplain  Stress Factors  Patient Stress Factors Not reviewed  Family Stress Factors Not reviewed  Advance Directives (For Healthcare)  Does Patient Have a Medical Advance Directive? No  Mental Health Advance Directives  Does Patient Have a Mental Health Advance Directive? No  OCC Met patient at [facility] to perovide spiritual care and support.  No  family or friends present.  Patient was transported to CT Scan.  No emotional or spiritual needs exhibited.  Prayed ouitside room for ministry of presence.    Rev. Santiago Glad Huddelson-Shepard Keltz

## 2020-11-20 NOTE — ED Provider Notes (Signed)
[facility] EMERGENCY DEPARTMENT Provider Note   CSN: [id number] Arrival date & time: 11/20/20  1721     History Chief Complaint  Patient presents with   Level 2 Fall on Thinners    Eliquis    Penny DELPOZO is a 87 y.o. female.  HPI Patient is an 87 year old female that is presenting as a level 2 trauma after a fall.  Patient takes Eliquis daily.  She had a mechanical fall after getting out of her car today.  She states she was getting out of the car to begin walking with her walker when she tripped on the wheel and fell forward and hit the bridge of her nose.  She had no loss of consciousness.  She has an abrasion to the middle of her forehead that is hemostatic.  Patient denies any other symptoms.  Patient denied any chest pain or shortness of breath prior to the fall.  Patient had a fall 2 weeks ago that resulted in pain in her left hip. She is being followed by her PCP for her left hip pain.   She denies any changes in sensation.  At baseline she walks with a walker. X-rays were taken after her fall two weeks ago that showed no fracture to her hip or left femur.  Patient was hypertensive in the ED today but she did not take her medication.  Patient denies any fevers, chills, headache, neck stiffness, midline spinal tenderness, chest pain, shortness of breath, nausea, vomiting, diarrhea, numbness or redness.    Past Medical History:  Diagnosis Date   Allergic rhinitis, cause unspecified    Anxiety state, unspecified    Arthritis    Colon polyp    Diaphragmatic hernia without mention of obstruction or gangrene    Diverticulosis of colon (without mention of hemorrhage)    Female stress incontinence    HTN (hypertension)    Insomnia, unspecified    Interstitial cystitis    surgery 02/01/2013   Irritable bowel syndrome    Memory loss    Other and unspecified hyperlipidemia    Other premature beats    Polyneuropathy in diabetes(357.2)    Postmenopausal  bleeding    Proteinuria    Rectal ulcer    Skin cancer    Stroke ([place]) 2018   Type II or unspecified type diabetes mellitus with neurological manifestations, not stated as uncontrolled(250.60)    Unspecified essential hypertension    Viral warts, unspecified     Patient Active Problem List   Diagnosis Date Noted   Acute pain of left thigh 11/20/2020   Weakness of left leg 11/20/2020   Acute left-sided low back pain with left-sided sciatica 11/20/2020   Chronic cerebral ischemia 11/20/2020   Frequent falls 11/10/2020   Chronic renal disease, stage 4, severely decreased glomerular filtration rate (GFR) between 15-29 mL/min/1.73 square meter ([place]) 07/22/2020   Seasonal allergic rhinitis due to pollen 02/03/2020   Intrinsic eczema 02/03/2020   Incomplete emptying of bladder 10/01/2019   Acquired hypothyroidism 08/15/2019   Iron deficiency anemia due to chronic blood loss 08/15/2019   Spinal stenosis, lumbar region with neurogenic claudication 05/26/2018   Spinal stenosis of lumbar region with neurogenic claudication 07/21/2017   Vertigo of central origin 07/21/2017   Benign hypertension 07/21/2017   Overweight (BMI 25.0-29.9) 11/07/2016   Paroxysmal atrial fibrillation ([place]) 09/03/2016   Diabetic polyneuropathy associated with type 2 diabetes mellitus ([place]) 09/03/2016   Aneurysm of vertebral artery ([place]) 19/50/9326   Embolic stroke involving  left cerebellar artery ([place]) 07/29/2016   DM (diabetes mellitus), type 2 with renal complications ([place]) 23/53/6144   Varicose vein of leg 12/21/2013   Bladder infection, chronic 01/04/2013   Polyneuropathy in diabetes(357.2)    Insomnia, unspecified    Essential hypertension    Hyperlipidemia    Chronic interstitial cystitis       OB History   No obstetric history on file.     Family History  Problem Relation Age of Onset   Pancreatic cancer Mother 87   Heart attack Father    Bladder Cancer Father    Hypertension Sister     Diabetes Sister    COPD Sister    Hypertension Son    Diabetes Brother    Heart disease Brother    Hypertension Brother    Diabetes Brother    Heart disease Brother 35       Myocardial Infarction    Heart attack Brother     Social History   Tobacco Use   Smoking status: Former    Packs/day: 0.25    Years: 62.00    Pack years: 15.50    Types: Cigarettes    Quit date: 04/02/2011    Years since quitting: 9.6   Smokeless tobacco: Never   Tobacco comments:    stopped consistently smoking in 2012 but still smokes occasionally (one pack per week)  Vaping Use   Vaping Use: Never used  Substance Use Topics   Alcohol use: No   Drug use: No    Home Medications Prior to Admission medications   Medication Sig Start Date End Date Taking? Authorizing Provider  acetaminophen (TYLENOL) 500 MG tablet Take 500 mg by mouth every 6 (six) hours as needed (pain).   Yes [provider]  amLODipine (NORVASC) 2.5 MG tablet Take 2.5 mg by mouth 2 (two) times daily. 10/03/20  Yes [provider]  apixaban (ELIQUIS) 2.5 MG TABS tablet Take 2.5 mg by mouth 2 (two) times daily.   Yes [provider]  atorvastatin (LIPITOR) 40 MG tablet Take 40 mg by mouth at bedtime. 06/26/20  Yes [provider]  gabapentin (NEURONTIN) 300 MG capsule Take 300 mg by mouth 3 (three) times daily. 09/25/20  Yes [provider]  Iron-Vitamin C 65-125 MG TABS Take 1 tablet by mouth every morning.   Yes [provider]  omeprazole (PRILOSEC) 20 MG capsule Take 20 mg by mouth 2 (two) times daily. 10/18/20  Yes [provider]  pioglitazone (ACTOS) 15 MG tablet Take 15 mg by mouth every morning. 06/02/20  Yes [provider]  vitamin B-12 (CYANOCOBALAMIN) 1000 MCG tablet Take 1,000 mcg by mouth every morning.   Yes [provider]  Vitamin D, Ergocalciferol, (DRISDOL) 1.25 MG (50000 UNIT) CAPS capsule Take 50,000 Units by mouth every Saturday.   Yes  [provider]  ACCU-CHEK AVIVA PLUS test strip USE TO TEST BLOOD SUGAR TWICE DAILY. 09/11/18   Reed, [name] L, DO  amLODipine (NORVASC) 2.5 MG tablet TAKE 1 TABLET TWICE A DAY 10/03/20   [name], MD  apixaban (ELIQUIS) 2.5 MG TABS tablet Take 1 tablet (2.5 mg total) by mouth 2 (two) times daily. 09/01/20   [name], MD  atorvastatin (LIPITOR) 40 MG tablet TAKE 1 TABLET DAILY AT 6 P.M. 09/30/19   Reed, [name] L, DO  citalopram (CELEXA) 10 MG tablet TAKE 1 TABLET DAILY 09/06/19   Reed, [name] L, DO  gabapentin (NEURONTIN) 300 MG capsule Take 1  capsule (300 mg total) by mouth 3 (three) times daily. 09/25/20   [name], MD  hyoscyamine (LEVSIN SL) 0.125 MG SL tablet Place 1 tablet (0.125 mg total) under the tongue every 4 (four) hours as needed. Take tablet every 4-6 hours for abdominal pain and cramping 03/23/20   Levin Erp, PA  Lancets (ACCU-CHEK MULTICLIX) lancets Use to test blood sugar twice daily. E11.29 07/04/17   Reed, [name] L, DO  levocetirizine (XYZAL) 5 MG tablet TAKE 1 TABLET EVERY EVENING 07/20/20   [name], MD  methocarbamol (ROBAXIN) 500 MG tablet Take 1 tablet (500 mg total) by mouth 4 (four) times daily. Patient not taking: Reported on 11/20/2020 11/10/20   Roma Schanz R, DO  NONFORMULARY OR COMPOUNDED ITEM Transport chair   #1    dx leg and hip pain s/p fall 11/10/20   Carollee Herter, Alferd Apa, DO  NONFORMULARY OR COMPOUNDED ITEM Rollator walker with seat    #1   dx gait abnormality 11/10/20   Carollee Herter, Alferd Apa, DO  omeprazole (PRILOSEC) 20 MG capsule TAKE 1 CAPSULE TWICE A DAY 07/20/20   Armbruster, Carlota Raspberry, MD  ondansetron (ZOFRAN ODT) 4 MG disintegrating tablet Take 1 tablet (4 mg total) by mouth every 6 (six) hours. Patient taking differently: Take 4 mg by mouth as needed. 03/23/20   Levin Erp, PA  pioglitazone (ACTOS) 15 MG tablet TAKE 1 TABLET DAILY 09/06/19   Reed, [name] L, DO  triamcinolone cream (KENALOG) 0.5 %  Apply 1 application topically 2 (two) times daily. 02/09/20   [name], MD  vitamin B-12 (CYANOCOBALAMIN) 1000 MCG tablet Take 1,000 mcg by mouth daily.    [provider]  Vitamin D, Ergocalciferol, (DRISDOL) 1.25 MG (50000 UNIT) CAPS capsule TAKE 1 CAPSULE ONCE WEEKLY FOR VITAMIN D 06/21/19   Reed, [name] L, DO    Allergies    Jardiance [empagliflozin], Metformin and related, Ceclor [cefaclor], Cefaclor, Codeine, Codeine, Jardiance [empagliflozin], Lopid [gemfibrozil], Lopid [gemfibrozil], Lopressor [metoprolol tartrate], Metformin and related, Metoprolol, Morphine and related, Morphine and related, Niacin, Niacin and related, Niacin and related, Relafen [nabumetone], Relafen [nabumetone], Septra [sulfamethoxazole-trimethoprim], Septra [sulfamethoxazole-trimethoprim], Tetracycline, Tetracyclines & related, Toprol xl [metoprolol tartrate], Toprol xl [metoprolol], Adhesive [tape], and Tape  Review of Systems   Review of Systems  Constitutional:  Negative for chills, diaphoresis, fatigue and fever.  HENT:  Negative for congestion, dental problem, ear discharge, ear pain, facial swelling, hearing loss, nosebleeds, postnasal drip, rhinorrhea, sinus pain, sneezing, sore throat and trouble swallowing.        Abrasion to the middle of the nose  Eyes:  Negative for pain and visual disturbance.  Respiratory:  Negative for cough, chest tightness, shortness of breath, wheezing and stridor.   Cardiovascular:  Negative for chest pain, palpitations and leg swelling.  Gastrointestinal:  Negative for abdominal distention, abdominal pain, blood in stool, constipation, diarrhea, nausea and vomiting.  Endocrine: Negative for polydipsia and polyuria.  Genitourinary:  Negative for difficulty urinating, dysuria, flank pain, frequency, hematuria, urgency, vaginal bleeding and vaginal discharge.  Musculoskeletal:  Negative for myalgias, neck pain and neck stiffness.  Skin:  Negative for rash and wound.        Abrasion to right knee  Allergic/Immunologic: Negative for environmental allergies and food allergies.  Neurological:  Negative for dizziness, seizures, syncope, facial asymmetry, speech difficulty, weakness, light-headedness, numbness and headaches.  Psychiatric/Behavioral:  Negative for agitation, behavioral problems and confusion.    Physical Exam Updated Vital Signs BP Marland Kitchen)  177/73   Pulse 63   Temp 98.4 F (36.9 C) (Oral)   Resp 12   Ht 5\' 6"  (1.676 m)   Wt 78 kg   SpO2 93%   BMI 27.76 kg/m   Physical Exam Vitals and nursing note reviewed.  Constitutional:      General: She is not in acute distress.    Appearance: Normal appearance. She is normal weight. She is not ill-appearing.  HENT:     Head: Normocephalic and atraumatic.     Right Ear: External ear normal.     Left Ear: External ear normal.     Nose: Nose normal. No congestion.     Comments: Abrasion to bridge of nose     Mouth/Throat:     Mouth: Mucous membranes are moist.     Pharynx: Oropharynx is clear. No oropharyngeal exudate or posterior oropharyngeal erythema.  Eyes:     General: No visual field deficit.    Extraocular Movements: Extraocular movements intact.     Conjunctiva/sclera: Conjunctivae normal.     Pupils: Pupils are equal, round, and reactive to light.  Neck:     Vascular: No carotid bruit.  Cardiovascular:     Rate and Rhythm: Normal rate and regular rhythm.     Pulses: Normal pulses.     Heart sounds: Normal heart sounds. No murmur heard. Pulmonary:     Effort: Pulmonary effort is normal. No respiratory distress.     Breath sounds: Normal breath sounds. No stridor. No wheezing, rhonchi or rales.  Chest:     Chest wall: No tenderness.  Abdominal:     General: Bowel sounds are normal. There is no distension.     Palpations: Abdomen is soft.     Tenderness: There is no abdominal tenderness. There is no right CVA tenderness, left CVA tenderness, guarding or rebound.  Musculoskeletal:         General: No swelling or tenderness. Normal range of motion.     Cervical back: Normal range of motion and neck supple. No rigidity, tenderness or bony tenderness.     Thoracic back: Normal. No tenderness or bony tenderness.     Lumbar back: Normal. No tenderness or bony tenderness.     Right hip: Normal. No tenderness or bony tenderness. Normal range of motion. Normal strength.     Left hip: Normal. No tenderness or bony tenderness. Normal range of motion. Normal strength.     Right upper leg: Normal.     Left upper leg: Normal.     Right knee: Normal. No swelling, lacerations or bony tenderness. Normal range of motion. No tenderness.     Left knee: Normal.     Right lower leg: Normal. No edema.     Left lower leg: Normal. No edema.  Skin:    General: Skin is warm and dry.     Coloration: Skin is not jaundiced.     Comments: Abrasion to right knee  Neurological:     General: No focal deficit present.     Mental Status: She is alert and oriented to person, place, and time. Mental status is at baseline.     Cranial Nerves: Cranial nerves are intact. No cranial nerve deficit, dysarthria or facial asymmetry.     Sensory: Sensation is intact. No sensory deficit.     Motor: Motor function is intact. No weakness.     Coordination: Coordination is intact. Finger-Nose-Finger Test normal.     Gait: Gait is intact. Gait normal.  Psychiatric:        Mood and Affect: Mood normal.        Behavior: Behavior normal.        Thought Content: Thought content normal.        Judgment: Judgment normal.    ED Results / Procedures / Treatments   Labs (all labs ordered are listed, but only abnormal results are displayed) Labs Reviewed  COMPREHENSIVE METABOLIC PANEL - Abnormal; Notable for the following components:      Result Value   Creatinine, Ser 1.51 (*)    Total Bilirubin 1.3 (*)    GFR, Estimated 33 (*)    All other components within normal limits  URINALYSIS, ROUTINE W REFLEX  MICROSCOPIC - Abnormal; Notable for the following components:   Color, Urine STRAW (*)    Hgb urine dipstick SMALL (*)    All other components within normal limits  I-STAT CHEM 8, ED - Abnormal; Notable for the following components:   BUN 28 (*)    Creatinine, Ser 1.40 (*)    Calcium, Ion 1.07 (*)    All other components within normal limits  RESP PANEL BY RT-PCR (FLU A&B, COVID) ARPGX2  CBC  ETHANOL  LACTIC ACID, PLASMA  PROTIME-INR  I-STAT CHEM 8, ED  SAMPLE TO BLOOD BANK    EKG None  Radiology CT ABDOMEN PELVIS WO CONTRAST  Result Date: 11/20/2020 CLINICAL DATA:  Chest trauma, minor. EXAM: CT CHEST, ABDOMEN AND PELVIS WITHOUT CONTRAST TECHNIQUE: Multidetector CT imaging of the chest, abdomen and pelvis was performed following the standard protocol without IV contrast. COMPARISON:  None. CT AP from 02/05/2019 FINDINGS: CT CHEST FINDINGS Cardiovascular: Normal heart size. Aortic atherosclerosis. Coronary artery calcifications. Mediastinum/Nodes: No enlarged mediastinal, hilar, or axillary lymph nodes. Thyroid gland, trachea, and esophagus demonstrate no significant findings. Lungs/Pleura: No pleural effusion. Mild diffuse interlobular septal thickening identified. Airspace consolidation, pulmonary contusion, or pneumothorax. Scattered small pulmonary nodules are noted bilaterally, including: 4 mm left upper lobe lung nodule, image 37/5. 3 mm right upper lobe lung nodule, image 37/5. 2 mm left lower lobe lung nodule, image 100/5. Groundglass nodule within the periphery of the left upper lobe measures 0.8 cm, image 56/5. Scattered bilateral calcified granulomas are noted. Musculoskeletal: No acute findings.  No fractures identified CT ABDOMEN PELVIS FINDINGS Hepatobiliary: No focal liver abnormality is seen. No gallstones, gallbladder wall thickening, or biliary dilatation. Pancreas: Unremarkable. No pancreatic ductal dilatation or surrounding inflammatory changes. Spleen: Normal in size  without focal abnormality. Adrenals/Urinary Tract: Normal adrenal glands. 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 No nephrolithiasis, hydronephrosis or mass. Urinary bladder appears distended. There is a large diverticula arising off the anterior dome of the bladder measuring 2.5 x 3.6 cm. Mild bladder wall thickening with surrounding soft tissue stranding is noted. Stomach/Bowel: Stomach appears normal. Large duodenal diverticulum is noted. No signs of bowel wall thickening, inflammation, or distension. Scattered distal colonic diverticula noted without signs of acute inflammation. Vascular/Lymphatic: Aortic atherosclerosis. No aneurysm. No abdominopelvic adenopathy. Reproductive: Status post hysterectomy. No adnexal masses. Other: No free fluid identified within the abdomen or pelvis. Musculoskeletal: Curvature of the lumbar spine is convex towards the left. Anterolisthesis of L3 on L4 and L4 on L5 is identified. Marked degenerative disc disease noted L2-3, L4-5 and L5-S1. No acute fracture identified. IMPRESSION: 1. No acute findings identified within the chest, abdomen or pelvis. 2. Small scattered lung nodules are identified which measure up to 4 mm. No follow-up needed if patient is low-risk (  and has no known or suspected primary neoplasm). Non-contrast chest CT can be considered in 12 months if patient is high-risk. This recommendation follows the consensus statement: Guidelines for Management of Incidental Pulmonary Nodules Detected on CT Images: From the Fleischner Society 2017; Radiology 2017; 284:228-243. 3. Left upper lobe non solid nodule measures 8 mm. Initial follow-up with CT at 6-12 months is recommended to confirm persistence. If persistent, repeat CT is recommended every 2 years until 5 years of stability has been established. This recommendation follows the consensus statement: Guidelines for Management of Incidental Pulmonary Nodules Detected on CT Images: From the Fleischner  Society 2017; Radiology 2017; 284:228-243. 4. Left kidney AML. 5. Large bladder diverticula with mild bladder wall thickening and surrounding soft tissue stranding. Correlate for any clinical signs or symptoms of cystitis. 6. Lumbar scoliosis and degenerative disc disease. 7. Coronary artery calcifications. 8. Aortic atherosclerosis. Aortic Atherosclerosis (ICD10-I70.0). Electronically Signed   By: Kerby Moors M.D.   On: 11/20/2020 19:15   CT HEAD WO CONTRAST  Result Date: 11/20/2020 CLINICAL DATA:  Facial trauma EXAM: CT HEAD WITHOUT CONTRAST TECHNIQUE: Contiguous axial images were obtained from the base of the skull through the vertex without intravenous contrast. COMPARISON:  None. FINDINGS: Brain: There is atrophy and chronic small vessel disease changes. No acute intracranial abnormality. Specifically, no hemorrhage, hydrocephalus, mass lesion, acute infarction, or significant intracranial injury. Vascular: No hyperdense vessel or unexpected calcification. Skull: No acute calvarial abnormality. Sinuses/Orbits: No acute findings.  Right frontal osteoma. Other: None IMPRESSION: Atrophy, chronic microvascular disease. No acute intracranial abnormality. Electronically Signed   By: Rolm Baptise M.D.   On: 11/20/2020 18:37   CT Chest Wo Contrast  Result Date: 11/20/2020 CLINICAL DATA:  Chest trauma, minor. EXAM: CT CHEST, ABDOMEN AND PELVIS WITHOUT CONTRAST TECHNIQUE: Multidetector CT imaging of the chest, abdomen and pelvis was performed following the standard protocol without IV contrast. COMPARISON:  None. CT AP from 02/05/2019 FINDINGS: CT CHEST FINDINGS Cardiovascular: Normal heart size. Aortic atherosclerosis. Coronary artery calcifications. Mediastinum/Nodes: No enlarged mediastinal, hilar, or axillary lymph nodes. Thyroid gland, trachea, and esophagus demonstrate no significant findings. Lungs/Pleura: No pleural effusion. Mild diffuse interlobular septal thickening identified. Airspace  consolidation, pulmonary contusion, or pneumothorax. Scattered small pulmonary nodules are noted bilaterally, including: 4 mm left upper lobe lung nodule, image 37/5. 3 mm right upper lobe lung nodule, image 37/5. 2 mm left lower lobe lung nodule, image 100/5. Groundglass nodule within the periphery of the left upper lobe measures 0.8 cm, image 56/5. Scattered bilateral calcified granulomas are noted. Musculoskeletal: No acute findings.  No fractures identified CT ABDOMEN PELVIS FINDINGS Hepatobiliary: No focal liver abnormality is seen. No gallstones, gallbladder wall thickening, or biliary dilatation. Pancreas: Unremarkable. No pancreatic ductal dilatation or surrounding inflammatory changes. Spleen: Normal in size without focal abnormality. Adrenals/Urinary Tract: Normal adrenal glands. 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 No nephrolithiasis, hydronephrosis or mass. Urinary bladder appears distended. There is a large diverticula arising off the anterior dome of the bladder measuring 2.5 x 3.6 cm. Mild bladder wall thickening with surrounding soft tissue stranding is noted. Stomach/Bowel: Stomach appears normal. Large duodenal diverticulum is noted. No signs of bowel wall thickening, inflammation, or distension. Scattered distal colonic diverticula noted without signs of acute inflammation. Vascular/Lymphatic: Aortic atherosclerosis. No aneurysm. No abdominopelvic adenopathy. Reproductive: Status post hysterectomy. No adnexal masses. Other: No free fluid identified within the abdomen or pelvis. Musculoskeletal: Curvature of the lumbar spine is convex towards the left. Anterolisthesis of L3  on L4 and L4 on L5 is identified. Marked degenerative disc disease noted L2-3, L4-5 and L5-S1. No acute fracture identified. IMPRESSION: 1. No acute findings identified within the chest, abdomen or pelvis. 2. Small scattered lung nodules are identified which measure up to 4 mm. No follow-up needed if patient  is low-risk (and has no known or suspected primary neoplasm). Non-contrast chest CT can be considered in 12 months if patient is high-risk. This recommendation follows the consensus statement: Guidelines for Management of Incidental Pulmonary Nodules Detected on CT Images: From the Fleischner Society 2017; Radiology 2017; 284:228-243. 3. Left upper lobe non solid nodule measures 8 mm. Initial follow-up with CT at 6-12 months is recommended to confirm persistence. If persistent, repeat CT is recommended every 2 years until 5 years of stability has been established. This recommendation follows the consensus statement: Guidelines for Management of Incidental Pulmonary Nodules Detected on CT Images: From the Fleischner Society 2017; Radiology 2017; 284:228-243. 4. Left kidney AML. 5. Large bladder diverticula with mild bladder wall thickening and surrounding soft tissue stranding. Correlate for any clinical signs or symptoms of cystitis. 6. Lumbar scoliosis and degenerative disc disease. 7. Coronary artery calcifications. 8. Aortic atherosclerosis. Aortic Atherosclerosis (ICD10-I70.0). Electronically Signed   By: Kerby Moors M.D.   On: 11/20/2020 19:15   CT CERVICAL SPINE WO CONTRAST  Result Date: 11/20/2020 CLINICAL DATA:  Polytrauma, critical, head/C-spine injury suspected EXAM: CT CERVICAL SPINE WITHOUT CONTRAST TECHNIQUE: Multidetector CT imaging of the cervical spine was performed without intravenous contrast. Multiplanar CT image reconstructions were also generated. COMPARISON:  None. FINDINGS: Alignment: No subluxation. Skull base and vertebrae: No acute fracture. No primary bone lesion or focal pathologic process. Soft tissues and spinal canal: No prevertebral fluid or swelling. No visible canal hematoma. Disc levels: Advanced degenerative disc and facet disease diffusely. Upper chest: No acute findings Other: None IMPRESSION: Advanced degenerative changes.  No acute bony abnormality. Electronically  Signed   By: Rolm Baptise M.D.   On: 11/20/2020 18:39   DG Pelvis Portable  Result Date: 11/20/2020 CLINICAL DATA:  Trauma, fell, anticoagulated EXAM: PORTABLE PELVIS 1-2 VIEWS COMPARISON:  02/05/2019 FINDINGS: Single frontal view of the pelvis was obtained, obscuring portions of the pubic symphysis and inferior pubic rami bilaterally. No acute displaced fracture. Symmetrical bilateral hip osteoarthritis with axial joint space narrowing. Severe lower lumbar spondylosis. Extensive atherosclerosis. IMPRESSION: 1. No acute displaced fracture. 2. Degenerative changes of the bilateral hips and lumbar spine. Electronically Signed   By: Randa Ngo M.D.   On: 11/20/2020 18:27   CT T-SPINE NO CHARGE  Result Date: 11/20/2020 CLINICAL DATA:  Trauma. EXAM: CT THORACIC AND LUMBAR SPINE WITHOUT CONTRAST TECHNIQUE: Multidetector CT imaging of the thoracic and lumbar spine was performed without contrast. Multiplanar CT image reconstructions were also generated. COMPARISON:  CT chest abdomen and pelvis 11/20/2020 FINDINGS: CT THORACIC SPINE FINDINGS Alignment: Normal. Vertebrae: No acute fracture or focal pathologic process. Paraspinal and other soft tissues: Negative. Disc levels: Mild degenerative changes throughout with narrowed interspaces and endplate osteophyte formation. CT LUMBAR SPINE FINDINGS Alignment: Mild anterior subluxation of L4 on L5, likely degenerative. Otherwise normal alignment. Vertebrae: No vertebral compression deformities. No acute fracture is identified. No focal bone lesion or bone destruction. Endplate sclerosis is consistent with discogenic degenerative changes. Paraspinal and other soft tissues: No abnormal paraspinal soft tissue mass or infiltration. Calcification of the aorta. See additional report of CT abdomen and pelvis for evaluation of abdominal organs. Disc levels: Degenerative changes with disc space narrowing,  endplate osteophyte formation, and degenerative disc disease suggested  at L2-3, L4-5, and L5-S1 levels. Diffuse degenerative changes throughout the facet joints. IMPRESSION: CT THORACIC SPINE IMPRESSION Mild degenerative changes. Normal alignment. No acute displaced fractures identified. CT LUMBAR SPINE IMPRESSION Slight anterior subluxation at L4-5 is likely degenerative. Degenerative disc disease at multiple levels. No acute displaced fractures identified. Electronically Signed   By: Lucienne Capers M.D.   On: 11/20/2020 19:51   CT L-SPINE NO CHARGE  Result Date: 11/20/2020 CLINICAL DATA:  Trauma. EXAM: CT THORACIC AND LUMBAR SPINE WITHOUT CONTRAST TECHNIQUE: Multidetector CT imaging of the thoracic and lumbar spine was performed without contrast. Multiplanar CT image reconstructions were also generated. COMPARISON:  CT chest abdomen and pelvis 11/20/2020 FINDINGS: CT THORACIC SPINE FINDINGS Alignment: Normal. Vertebrae: No acute fracture or focal pathologic process. Paraspinal and other soft tissues: Negative. Disc levels: Mild degenerative changes throughout with narrowed interspaces and endplate osteophyte formation. CT LUMBAR SPINE FINDINGS Alignment: Mild anterior subluxation of L4 on L5, likely degenerative. Otherwise normal alignment. Vertebrae: No vertebral compression deformities. No acute fracture is identified. No focal bone lesion or bone destruction. Endplate sclerosis is consistent with discogenic degenerative changes. Paraspinal and other soft tissues: No abnormal paraspinal soft tissue mass or infiltration. Calcification of the aorta. See additional report of CT abdomen and pelvis for evaluation of abdominal organs. Disc levels: Degenerative changes with disc space narrowing, endplate osteophyte formation, and degenerative disc disease suggested at L2-3, L4-5, and L5-S1 levels. Diffuse degenerative changes throughout the facet joints. IMPRESSION: CT THORACIC SPINE IMPRESSION Mild degenerative changes. Normal alignment. No acute displaced fractures identified. CT  LUMBAR SPINE IMPRESSION Slight anterior subluxation at L4-5 is likely degenerative. Degenerative disc disease at multiple levels. No acute displaced fractures identified. Electronically Signed   By: Lucienne Capers M.D.   On: 11/20/2020 19:51   DG Chest Port 1 View  Result Date: 11/20/2020 CLINICAL DATA:  Trauma, fall EXAM: PORTABLE CHEST 1 VIEW COMPARISON:  None. FINDINGS: Normal mediastinum and cardiac silhouette. Normal pulmonary vasculature. No evidence of effusion, infiltrate, or pneumothorax. No acute bony abnormality. Atherosclerotic calcification of the aorta.  Loop recorder noted. IMPRESSION: No acute cardiopulmonary process. Electronically Signed   By: Suzy Bouchard M.D.   On: 11/20/2020 18:12   DG FEMUR MIN 2 VIEWS LEFT  Result Date: 11/20/2020 CLINICAL DATA:  Fall EXAM: LEFT FEMUR 2 VIEWS COMPARISON:  X-ray dated November 10 2020 FINDINGS: No acute fracture or dislocation. Vascular calcifications. Soft tissues otherwise unremarkable. IMPRESSION: No acute osseous abnormality. Electronically Signed   By: Yetta Glassman M.D.   On: 11/20/2020 15:59   CT MAXILLOFACIAL WO CONTRAST  Result Date: 11/20/2020 CLINICAL DATA:  Facial trauma EXAM: CT MAXILLOFACIAL WITHOUT CONTRAST TECHNIQUE: Multidetector CT imaging of the maxillofacial structures was performed. Multiplanar CT image reconstructions were also generated. COMPARISON:  None. FINDINGS: Osseous: No fracture or mandibular dislocation. No destructive process. Orbits: No fracture.  Globes intact. Sinuses: No air-fluid levels.  Small right frontal osteoma. Soft tissues: Negative Limited intracranial: See head CT report IMPRESSION: No facial or orbital fracture Electronically Signed   By: Rolm Baptise M.D.   On: 11/20/2020 18:38    Procedures Procedures   Medications Ordered in ED Medications - No data to display  ED Course  I have reviewed the triage vital signs and the nursing notes.  Pertinent labs & imaging results that were  available during my care of the patient were reviewed by me and considered in my medical decision making (see chart for  details).    MDM Rules/Calculators/A&P                         IRETHA KIRLEY is a 87 y.o. female that is presenting as a level 2 trauma after a fall while on eliquis. Patient tripped on her walker and fell forward and hit the bridge of her nose. She denies loss of consciousness. She states that she is not in any pain. She is hemodynamically stable and in no acute distress. She is hypertensive in the setting of not taking her blood pressure medications today. Her physical exam is notable for an abrasion to the bridge of her nose with no signs of laceration. She has an abrasion to her right knee. She has full range of motion of her right knee with no pain or tenderness. Her neurological exam shows no focal neuro deficits. Full trauma scan performed. CXR and pelvic x-ray showed no fracture. Her labs are unremarkable.   CT head showed no acute intracranial abnormality. CT face showed no fracture. CT cervical spine showed no fracture. Cervical collar was cleared clinically. CT chest abd pelvis showed no acute injuries. I discussed all finding on the chest abd pelvis and asked her to follow up with her PCP for continued management and repeat imaging. CT thoracic and lumbar spine showed no acute fracture.   Patient had a fall 2 weeks ago that resulted in pain in her left hip. She is being followed by her PCP for her left hip pain.   She denies any changes in sensation.  At baseline she walks with a walker. She has good range of motion and strength in her left hip and left leg. X-rays were taken after her fall two weeks ago that showed no fracture to her left hip or left femur.   Patient's son is at the bedside and confirms the patients story. He states that she is only able to ambulate with a walker. Son and Patient states that she is at her mental baseline. She is able to stand without  complications. She says that she is in no pain. Her abrasions were irrigated and cleaned. Bacitracin applied to abrasions. Patient is going to follow up with her PCP.  Patient states compliance and understanding of the plan. I explained labs and imaging to the patient. No further questions at this time from the patient.  The patient is safe and stable for discharge at this time with return precautions provided and a plan for follow-up care in place as needed  The plan for this patient was discussed with Dr. Vanita Panda, who voiced agreement and who oversaw evaluation and treatment of this patient.   Final Clinical Impression(s) / ED Diagnoses Final diagnoses:  Trauma  Fall    Rx / DC Orders ED Discharge Orders     None        Doretha Sou, MD 11/21/20 1134    Carmin Muskrat, MD 11/22/20 2348

## 2020-11-20 NOTE — ED Notes (Signed)
Pt's abrasions irrigated and bandaged with bacitracin.

## 2020-11-21 ENCOUNTER — Telehealth: Payer: Self-pay | Admitting: Internal Medicine

## 2020-11-21 ENCOUNTER — Encounter: Payer: Self-pay | Admitting: Oncology

## 2020-11-21 NOTE — ED Notes (Signed)
Pt discharged and wheeled out of the ED in a wheel chair without difficulty. 

## 2020-11-21 NOTE — Telephone Encounter (Signed)
Dusti from [facility] called regarding a paper faxed to [place] in regards of the patient's order for a Rollator. She states that she needs the doctor's notes in order to be able to fulfil the medical equipment order sent by Dr. Etter Sjogren. She stated she faxed it to [place] (verified fax number) and has yet to receive it. She can be reached at [phone]

## 2020-11-23 ENCOUNTER — Telehealth: Payer: Self-pay | Admitting: Internal Medicine

## 2020-11-23 NOTE — Telephone Encounter (Signed)
Return kit ordered and the patient should receive it in 7-10 business days.

## 2020-11-23 NOTE — Telephone Encounter (Signed)
Son of the patient was requesting a return kit for the patient's monitor. The mailing address has been verified

## 2020-11-27 ENCOUNTER — Ambulatory Visit: Payer: Medicare Other | Admitting: Podiatry

## 2020-11-27 NOTE — Telephone Encounter (Signed)
Called [employer] back. Have not received paperwork. [employer] states they have received a Rx from a different provider for a transport chair.

## 2020-12-02 ENCOUNTER — Other Ambulatory Visit: Payer: Medicare Other

## 2020-12-12 ENCOUNTER — Ambulatory Visit
Admission: RE | Admit: 2020-12-12 | Discharge: 2020-12-12 | Disposition: A | Discharge status: Home / Self Care | Payer: Medicare Other | Source: Ambulatory Visit | Attending: Internal Medicine | Admitting: Internal Medicine

## 2020-12-12 ENCOUNTER — Other Ambulatory Visit: Payer: Self-pay

## 2020-12-12 ENCOUNTER — Other Ambulatory Visit: Payer: Self-pay | Admitting: Internal Medicine

## 2020-12-12 DIAGNOSIS — R29818 Other symptoms and signs involving the nervous system: Secondary | ICD-10-CM | POA: Diagnosis not present

## 2020-12-12 DIAGNOSIS — M545 Low back pain, unspecified: Secondary | ICD-10-CM | POA: Diagnosis not present

## 2020-12-12 DIAGNOSIS — R29898 Other symptoms and signs involving the musculoskeletal system: Secondary | ICD-10-CM

## 2020-12-12 DIAGNOSIS — M5442 Lumbago with sciatica, left side: Secondary | ICD-10-CM

## 2020-12-12 DIAGNOSIS — I6782 Cerebral ischemia: Secondary | ICD-10-CM

## 2020-12-12 DIAGNOSIS — M48062 Spinal stenosis, lumbar region with neurogenic claudication: Secondary | ICD-10-CM

## 2020-12-12 DIAGNOSIS — R531 Weakness: Secondary | ICD-10-CM | POA: Diagnosis not present

## 2020-12-12 DIAGNOSIS — M79605 Pain in left leg: Secondary | ICD-10-CM | POA: Diagnosis not present

## 2020-12-12 DIAGNOSIS — M48061 Spinal stenosis, lumbar region without neurogenic claudication: Secondary | ICD-10-CM | POA: Diagnosis not present

## 2020-12-12 DIAGNOSIS — R200 Anesthesia of skin: Secondary | ICD-10-CM | POA: Diagnosis not present

## 2020-12-26 ENCOUNTER — Ambulatory Visit (INDEPENDENT_AMBULATORY_CARE_PROVIDER_SITE_OTHER): Payer: Medicare Other | Admitting: Internal Medicine

## 2020-12-26 ENCOUNTER — Other Ambulatory Visit: Payer: Self-pay

## 2020-12-26 VITALS — BP 158/76 | HR 60 | Temp 98.3°F | Resp 16 | Ht 66.0 in | Wt 173.0 lb

## 2020-12-26 DIAGNOSIS — N3001 Acute cystitis with hematuria: Secondary | ICD-10-CM | POA: Diagnosis not present

## 2020-12-26 DIAGNOSIS — I639 Cerebral infarction, unspecified: Secondary | ICD-10-CM

## 2020-12-26 DIAGNOSIS — R300 Dysuria: Secondary | ICD-10-CM | POA: Insufficient documentation

## 2020-12-26 DIAGNOSIS — N3281 Overactive bladder: Secondary | ICD-10-CM | POA: Insufficient documentation

## 2020-12-26 DIAGNOSIS — Z23 Encounter for immunization: Secondary | ICD-10-CM

## 2020-12-26 DIAGNOSIS — I10 Essential (primary) hypertension: Secondary | ICD-10-CM | POA: Diagnosis not present

## 2020-12-26 LAB — URINALYSIS, ROUTINE W REFLEX MICROSCOPIC
Bilirubin Urine: NEGATIVE
Ketones, ur: NEGATIVE
Nitrite: NEGATIVE
Specific Gravity, Urine: 1.01 (ref 1.000–1.030)
Total Protein, Urine: NEGATIVE
Urine Glucose: NEGATIVE
Urobilinogen, UA: 0.2 (ref 0.0–1.0)
pH: 6 (ref 5.0–8.0)

## 2020-12-26 MED ORDER — NITROFURANTOIN MONOHYD MACRO 100 MG PO CAPS: 100.0000 mg | ORAL_CAPSULE | Freq: Two times a day (BID) | ORAL | 0 refills | Status: AC

## 2020-12-26 NOTE — Progress Notes (Signed)
Subjective:  Patient ID: Penny Miller, female    DOB: 07-22-1931  Age: 87 y.o. MRN: [id number]  CC: Hypertension and Urinary Tract Infection  This visit occurred during the SARS-CoV-2 public health emergency.  Safety protocols were in place, including screening questions prior to the visit, additional usage of staff PPE, and extensive cleaning of exam room while observing appropriate contact time as indicated for disinfecting solutions.    HPI Penny Miller presents for f/up - She complains of a several week history of dysuria, urgency, and frequency.  Outpatient Medications Prior to Visit  Medication Sig Dispense Refill   ACCU-CHEK AVIVA PLUS test strip USE TO TEST BLOOD SUGAR TWICE DAILY. 100 each 11   acetaminophen (TYLENOL) 500 MG tablet Take 500 mg by mouth every 6 (six) hours as needed (pain).     amLODipine (NORVASC) 2.5 MG tablet TAKE 1 TABLET TWICE A DAY 180 tablet 1   amLODipine (NORVASC) 2.5 MG tablet Take 2.5 mg by mouth 2 (two) times daily.     apixaban (ELIQUIS) 2.5 MG TABS tablet Take 1 tablet (2.5 mg total) by mouth 2 (two) times daily. 60 tablet 0   apixaban (ELIQUIS) 2.5 MG TABS tablet Take 2.5 mg by mouth 2 (two) times daily.     atorvastatin (LIPITOR) 40 MG tablet TAKE 1 TABLET DAILY AT 6 P.M. 90 tablet 3   atorvastatin (LIPITOR) 40 MG tablet Take 40 mg by mouth at bedtime.     citalopram (CELEXA) 10 MG tablet TAKE 1 TABLET DAILY 90 tablet 3   gabapentin (NEURONTIN) 300 MG capsule Take 1 capsule (300 mg total) by mouth 3 (three) times daily. 270 capsule 1   gabapentin (NEURONTIN) 300 MG capsule Take 300 mg by mouth 3 (three) times daily.     hyoscyamine (LEVSIN SL) 0.125 MG SL tablet Place 1 tablet (0.125 mg total) under the tongue every 4 (four) hours as needed. Take tablet every 4-6 hours for abdominal pain and cramping 15 tablet 2   Iron-Vitamin C 65-125 MG TABS Take 1 tablet by mouth every morning.     Lancets (ACCU-CHEK MULTICLIX) lancets Use to test blood sugar  twice daily. E11.29 100 each 11   levocetirizine (XYZAL) 5 MG tablet TAKE 1 TABLET EVERY EVENING 90 tablet 3   methocarbamol (ROBAXIN) 500 MG tablet Take 1 tablet (500 mg total) by mouth 4 (four) times daily. 45 tablet 0   NONFORMULARY OR COMPOUNDED ITEM Transport chair   #1    dx leg and hip pain s/p fall 1 each 0   NONFORMULARY OR COMPOUNDED ITEM Rollator walker with seat    #1   dx gait abnormality 1 each 0   omeprazole (PRILOSEC) 20 MG capsule TAKE 1 CAPSULE TWICE A DAY 180 capsule 3   omeprazole (PRILOSEC) 20 MG capsule Take 20 mg by mouth 2 (two) times daily.     ondansetron (ZOFRAN ODT) 4 MG disintegrating tablet Take 1 tablet (4 mg total) by mouth every 6 (six) hours. (Patient taking differently: Take 4 mg by mouth as needed.) 15 tablet 5   pioglitazone (ACTOS) 15 MG tablet TAKE 1 TABLET DAILY 90 tablet 3   pioglitazone (ACTOS) 15 MG tablet Take 15 mg by mouth every morning.     triamcinolone cream (KENALOG) 0.5 % Apply 1 application topically 2 (two) times daily. 454 g 1   vitamin B-12 (CYANOCOBALAMIN) 1000 MCG tablet Take 1,000 mcg by mouth daily.     vitamin B-12 (CYANOCOBALAMIN) 1000 MCG tablet  Take 1,000 mcg by mouth every morning.     Vitamin D, Ergocalciferol, (DRISDOL) 1.25 MG (50000 UNIT) CAPS capsule TAKE 1 CAPSULE ONCE WEEKLY FOR VITAMIN D 12 capsule 3   Vitamin D, Ergocalciferol, (DRISDOL) 1.25 MG (50000 UNIT) CAPS capsule Take 50,000 Units by mouth every Saturday.     No facility-administered medications prior to visit.    ROS Review of Systems  Constitutional:  Negative for chills, fatigue and fever.  HENT: Negative.    Eyes: Negative.   Respiratory:  Negative for cough and shortness of breath.   Cardiovascular:  Negative for chest pain, palpitations and leg swelling.  Gastrointestinal:  Negative for abdominal pain, diarrhea and nausea.  Genitourinary:  Positive for dysuria, frequency and pelvic pain. Negative for decreased urine volume, difficulty urinating, flank  pain, hematuria and urgency.  Musculoskeletal: Negative.   Skin: Negative.   Neurological:  Negative for dizziness, weakness, light-headedness and headaches.  Hematological:  Negative for adenopathy. Does not bruise/bleed easily.  Psychiatric/Behavioral: Negative.     Objective:  BP (!) 158/76 (BP Location: Right Arm, Patient Position: Sitting, Cuff Size: Large)   Pulse 60   Temp 98.3 F (36.8 C) (Oral)   Resp 16   Ht 5\' 6"  (1.676 m)   Wt 173 lb (78.5 kg)   SpO2 96%   BMI 27.92 kg/m   BP Readings from Last 3 Encounters:  12/26/20 (!) 158/76  11/20/20 (!) 177/73  11/20/20 138/70    Wt Readings from Last 3 Encounters:  12/26/20 173 lb (78.5 kg)  11/20/20 172 lb (78 kg)  11/20/20 171 lb (77.6 kg)    Physical Exam Vitals reviewed.  HENT:     Nose: Nose normal.     Mouth/Throat:     Mouth: Mucous membranes are moist.  Eyes:     Conjunctiva/sclera: Conjunctivae normal.  Cardiovascular:     Rate and Rhythm: Normal rate and regular rhythm.     Heart sounds: No murmur heard. Pulmonary:     Effort: Pulmonary effort is normal.     Breath sounds: No stridor. No wheezing, rhonchi or rales.  Abdominal:     General: Abdomen is flat.     Palpations: There is no mass.     Tenderness: There is no abdominal tenderness.  Musculoskeletal:        General: Normal range of motion.     Cervical back: Neck supple.  Skin:    General: Skin is warm.  Neurological:     General: No focal deficit present.     Mental Status: She is alert.  Psychiatric:        Mood and Affect: Mood normal.        Behavior: Behavior normal.    Lab Results  Component Value Date   WBC 9.6 11/20/2020   HGB 12.9 11/20/2020   HGB 12.8 11/20/2020   HCT 38.0 11/20/2020   HCT 39.8 11/20/2020   PLT 220 11/20/2020   GLUCOSE 87 11/20/2020   GLUCOSE 87 11/20/2020   CHOL 160 08/09/2019   TRIG 161 (H) 08/09/2019   HDL 51 08/09/2019   LDLCALC 83 08/09/2019   ALT 11 11/20/2020   AST 23 11/20/2020   NA 138  11/20/2020   NA 139 11/20/2020   K 4.7 11/20/2020   K 4.7 11/20/2020   CL 105 11/20/2020   CL 108 11/20/2020   CREATININE 1.51 (H) 11/20/2020   CREATININE 1.40 (H) 11/20/2020   BUN 21 11/20/2020   BUN 28 (H) 11/20/2020  CO2 24 11/20/2020   TSH 4.00 07/19/2020   INR 1.0 11/20/2020   HGBA1C 6.7 (H) 07/19/2020   MICROALBUR 9.8 08/09/2019    MR Brain Wo Contrast  Result Date: 12/12/2020 CLINICAL DATA:  Neurological deficit. Stroke suspected. Recent falling. Left leg pain, weakness and numbness. EXAM: MRI HEAD WITHOUT CONTRAST TECHNIQUE: Multiplanar, multiecho pulse sequences of the brain and surrounding structures were obtained without intravenous contrast. COMPARISON:  Head CT 11/20/2020.  MRI 06/15/2017. FINDINGS: Brain: Diffusion imaging does not show any acute or subacute infarction. Extensive chronic small-vessel ischemic changes are present throughout the pons. 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 Small old left occipital cortical infarction. Small old left lateral temporal cortical infarction. Extensive chronic small-vessel ischemic changes throughout the cerebral hemispheric white matter. No mass lesion, hemorrhage, hydrocephalus or extra-axial collection. Vascular: Major vessels at the base of the brain show flow. Skull and upper cervical spine: Negative Sinuses/Orbits: Small amount of fluid in the sphenoid sinus. Other sinuses clear. Orbits negative. Other: None IMPRESSION: No acute or subacute finding. Extensive chronic small-vessel ischemic changes throughout the brain. 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 Electronically Signed   By: Nelson Chimes M.D.   On: 12/12/2020 14:11   MR Lumbar Spine Wo Contrast  Result Date: 12/12/2020 CLINICAL DATA:  Numbness or tingling, paresthesia. Recent falling. Left leg pain with back pain and numbness. Left leg weakness. EXAM: MRI LUMBAR SPINE WITHOUT CONTRAST TECHNIQUE: Multiplanar, multisequence MR  imaging of the lumbar spine was performed. No intravenous contrast was administered. COMPARISON:  CT 11/20/2020.  MRI 09/13/2016. FINDINGS: Segmentation:  5 lumbar type vertebral bodies. Alignment: Mild scoliotic curvature convex to the left. 2 mm anterolisthesis L3-4. 4 mm anterolisthesis L4-5. Vertebrae: Chronic discogenic endplate marrow changes throughout the lumbar region. Conus medullaris and cauda equina: Conus extends to the L1 level. Conus and cauda equina appear normal. Paraspinal and other soft tissues: Negative Disc levels: T11-12, T12-L1 and L1-2: Minimal, non-compressive disc bulges. L2-3: Chronic disc degeneration with loss of disc height. Endplate osteophytes and bulging of the disc. Mild facet and ligamentous hypertrophy. Mild narrowing of the right lateral recess and intervertebral foramen on the right but without visible neural compression. L3-4: 2 mm of anterolisthesis. Broad-based disc protrusion. Facet and ligamentous hypertrophy. Moderate to marked multifactorial spinal stenosis, worsened compared to the study of 2018. L4-5: Chronic facet arthropathy with 4 mm of anterolisthesis. Disc degeneration with loss of disc height. Chronic shallow protrusion of disc material. Multifactorial spinal stenosis that could cause neural compression on either or both sides. Bilateral foraminal stenosis, right worse than left. Similar appearance to the study of 2018. L5-S1: Shallow protrusion of the disc. Mild facet and ligamentous hypertrophy. No compressive canal stenosis. Mild left foraminal narrowing, unchanged since 2018. IMPRESSION: L3-4: Moderate to marked multifactorial stenosis that could cause neural compression on either or both sides. Slight worsening since the study of 2018. L4-5: Moderate to marked multifactorial spinal stenosis that could cause neural compression on either or both sides, similar to the study of 2018. Foraminal stenosis right worse than left. L2-3: Chronic disc degeneration and  facet degeneration with mild right lateral recess narrowing. L5-S1: Shallow disc protrusion and facet degeneration. Mild left foraminal narrowing, unchanged since 2018. Electronically Signed   By: Nelson Chimes M.D.   On: 12/12/2020 14:08    Assessment & Plan:   Alanis was seen today for hypertension and urinary tract infection.  Diagnoses and all orders for this visit:  Flu vaccine need -  Flu Vaccine QUAD High Dose(Fluad)  Dysuria- Her UA is consistent with infection.  Will treat with nitrofurantoin awaiting urine culture results. -     Urinalysis, Routine w reflex microscopic; Future -     CULTURE, URINE COMPREHENSIVE; Future -     CULTURE, URINE COMPREHENSIVE -     Urinalysis, Routine w reflex microscopic  OAB (overactive bladder)  Acute cystitis with hematuria -     nitrofurantoin, macrocrystal-monohydrate, (MACROBID) 100 MG capsule; Take 1 capsule (100 mg total) by mouth 2 (two) times daily for 7 days.  Essential hypertension- Her blood pressure is adequately well controlled.  I am having Nylah P. Sangha start on nitrofurantoin (macrocrystal-monohydrate). I am also having her maintain her accu-chek multiclix, Accu-Chek Aviva Plus, Vitamin D (Ergocalciferol), citalopram, pioglitazone, atorvastatin, vitamin B-12, triamcinolone cream, hyoscyamine, ondansetron, levocetirizine, omeprazole, apixaban, gabapentin, amLODipine, NONFORMULARY OR COMPOUNDED ITEM, NONFORMULARY OR COMPOUNDED ITEM, methocarbamol, amLODipine, atorvastatin, pioglitazone, omeprazole, gabapentin, apixaban, vitamin B-12, Vitamin D (Ergocalciferol), acetaminophen, and Iron-Vitamin C.  Meds ordered this encounter  Medications   nitrofurantoin, macrocrystal-monohydrate, (MACROBID) 100 MG capsule    Sig: Take 1 capsule (100 mg total) by mouth 2 (two) times daily for 7 days.    Dispense:  14 capsule    Refill:  0      Follow-up: No follow-ups on file.  Scarlette Calico, MD

## 2020-12-28 ENCOUNTER — Encounter: Payer: Self-pay | Admitting: Internal Medicine

## 2020-12-28 LAB — CULTURE, URINE COMPREHENSIVE

## 2020-12-28 NOTE — Patient Instructions (Signed)
Urinary Tract Infection, Adult A urinary tract infection (UTI) is an infection of any part of the urinary tract. The urinary tract includes the kidneys, ureters, bladder, and urethra. These organs make, store, and get rid of urine in the body. An upper UTI affects the ureters and kidneys. A lower UTI affects the bladder and urethra. What are the causes? Most urinary tract infections are caused by bacteria in your genital area around your urethra, where urine leaves your body. These bacteria grow and cause inflammation of your urinary tract. What increases the risk? You are more likely to develop this condition if: You have a urinary catheter that stays in place. You are not able to control when you urinate or have a bowel movement (incontinence). You are female and you: Use a spermicide or diaphragm for birth control. Have low estrogen levels. Are pregnant. You have certain genes that increase your risk. You are sexually active. You take antibiotic medicines. You have a condition that causes your flow of urine to slow down, such as: An enlarged prostate, if you are female. Blockage in your urethra. A kidney stone. A nerve condition that affects your bladder control (neurogenic bladder). Not getting enough to drink, or not urinating often. You have certain medical conditions, such as: Diabetes. A weak disease-fighting system (immunesystem). Sickle cell disease. Gout. Spinal cord injury. What are the signs or symptoms? Symptoms of this condition include: Needing to urinate right away (urgency). Frequent urination. This may include small amounts of urine each time you urinate. Pain or burning with urination. Blood in the urine. Urine that smells bad or unusual. Trouble urinating. Cloudy urine. Vaginal discharge, if you are female. Pain in the abdomen or the lower back. You may also have: Vomiting or a decreased appetite. Confusion. Irritability or tiredness. A fever or  chills. Diarrhea. The first symptom in older adults may be confusion. In some cases, they may not have any symptoms until the infection has worsened. How is this diagnosed? This condition is diagnosed based on your medical history and a physical exam. You may also have other tests, including: Urine tests. Blood tests. Tests for STIs (sexually transmitted infections). If you have had more than one UTI, a cystoscopy or imaging studies may be done to determine the cause of the infections. How is this treated? Treatment for this condition includes: Antibiotic medicine. Over-the-counter medicines to treat discomfort. Drinking enough water to stay hydrated. If you have frequent infections or have other conditions such as a kidney stone, you may need to see a health care provider who specializes in the urinary tract (urologist). In rare cases, urinary tract infections can cause sepsis. Sepsis is a life-threatening condition that occurs when the body responds to an infection. Sepsis is treated in the hospital with IV antibiotics, fluids, and other medicines. Follow these instructions at home: Medicines Take over-the-counter and prescription medicines only as told by your health care provider. If you were prescribed an antibiotic medicine, take it as told by your health care provider. Do not stop using the antibiotic even if you start to feel better. General instructions Make sure you: Empty your bladder often and completely. Do not hold urine for long periods of time. Empty your bladder after sex. Wipe from front to back after urinating or having a bowel movement if you are female. Use each tissue only one time when you wipe. Drink enough fluid to keep your urine pale yellow. Keep all follow-up visits. This is important. Contact a health care provider   if: Your symptoms do not get better after 1-2 days. Your symptoms go away and then return. Get help right away if: You have severe pain in your  back or your lower abdomen. You have a fever or chills. You have nausea or vomiting. Summary A urinary tract infection (UTI) is an infection of any part of the urinary tract, which includes the kidneys, ureters, bladder, and urethra. Most urinary tract infections are caused by bacteria in your genital area. Treatment for this condition often includes antibiotic medicines. If you were prescribed an antibiotic medicine, take it as told by your health care provider. Do not stop using the antibiotic even if you start to feel better. Keep all follow-up visits. This is important. This information is not intended to replace advice given to you by your health care provider. Make sure you discuss any questions you have with your health care provider. Document Revised: 10/29/2019 Document Reviewed: 10/29/2019 Elsevier Patient Education  2022 Elsevier Inc.  

## 2021-01-03 ENCOUNTER — Inpatient Hospital Stay: Payer: Medicare Other | Attending: Oncology

## 2021-01-03 ENCOUNTER — Other Ambulatory Visit: Payer: Self-pay

## 2021-01-03 ENCOUNTER — Inpatient Hospital Stay (HOSPITAL_BASED_OUTPATIENT_CLINIC_OR_DEPARTMENT_OTHER): Payer: Medicare Other | Admitting: Oncology

## 2021-01-03 VITALS — BP 149/86 | HR 71 | Temp 97.8°F | Resp 17 | Ht 66.0 in | Wt 172.1 lb

## 2021-01-03 DIAGNOSIS — K5520 Angiodysplasia of colon without hemorrhage: Secondary | ICD-10-CM | POA: Insufficient documentation

## 2021-01-03 DIAGNOSIS — D500 Iron deficiency anemia secondary to blood loss (chronic): Secondary | ICD-10-CM

## 2021-01-03 DIAGNOSIS — D509 Iron deficiency anemia, unspecified: Secondary | ICD-10-CM | POA: Diagnosis not present

## 2021-01-03 LAB — CBC WITH DIFFERENTIAL (CANCER CENTER ONLY)
Abs Immature Granulocytes: 0.03 10*3/uL (ref 0.00–0.07)
Basophils Absolute: 0.1 10*3/uL (ref 0.0–0.1)
Basophils Relative: 1 %
Eosinophils Absolute: 0.3 10*3/uL (ref 0.0–0.5)
Eosinophils Relative: 4 %
HCT: 35.6 % — ABNORMAL LOW (ref 36.0–46.0)
Hemoglobin: 11.5 g/dL — ABNORMAL LOW (ref 12.0–15.0)
Immature Granulocytes: 0 %
Lymphocytes Relative: 12 %
Lymphs Abs: 1 10*3/uL (ref 0.7–4.0)
MCH: 29.8 pg (ref 26.0–34.0)
MCHC: 32.3 g/dL (ref 30.0–36.0)
MCV: 92.2 fL (ref 80.0–100.0)
Monocytes Absolute: 1 10*3/uL (ref 0.1–1.0)
Monocytes Relative: 11 %
Neutro Abs: 6 10*3/uL (ref 1.7–7.7)
Neutrophils Relative %: 72 %
Platelet Count: 178 10*3/uL (ref 150–400)
RBC: 3.86 MIL/uL — ABNORMAL LOW (ref 3.87–5.11)
RDW: 17.1 % — ABNORMAL HIGH (ref 11.5–15.5)
WBC Count: 8.4 10*3/uL (ref 4.0–10.5)
nRBC: 0 % (ref 0.0–0.2)

## 2021-01-03 LAB — IRON AND TIBC
Iron: 49 ug/dL (ref 41–142)
Saturation Ratios: 14 % — ABNORMAL LOW (ref 21–57)
TIBC: 356 ug/dL (ref 236–444)
UIBC: 307 ug/dL (ref 120–384)

## 2021-01-03 LAB — FERRITIN: Ferritin: 19 ng/mL (ref 11–307)

## 2021-01-03 NOTE — Progress Notes (Signed)
Hematology and Oncology Follow Up Visit  Penny Miller [id number] 11/05/1931 87 y.o. 01/03/2021 9:40 AM [name], MDJones, Arvid Right, MD   Principle Diagnosis: 87 year old woman with iron deficiency anemia related to chronic GI blood losses from angiodysplasia and poor iron absorption diagnosed in August 2020.     Prior Therapy:  He is status post IV iron infusion in August 2020 with Feraheme.  This was repeated in December 2021.    Current therapy: Oral iron replacement.  Interim History: Penny Miller presents today for repeat follow-up.  Since the last visit, she reports no major changes in her health.  She denies any hematochezia, melena or hemoptysis.  She did have a urinary frequency and diagnosed with urinary tract irritation and infection.  She continues to take oral iron replacement without any major issues related to it.  She denies any nausea, abdominal pain or dyspepsia.    Medications: Updated on review. Current Outpatient Medications  Medication Sig Dispense Refill   ACCU-CHEK AVIVA PLUS test strip USE TO TEST BLOOD SUGAR TWICE DAILY. 100 each 11   acetaminophen (TYLENOL) 500 MG tablet Take 500 mg by mouth every 6 (six) hours as needed (pain).     amLODipine (NORVASC) 2.5 MG tablet TAKE 1 TABLET TWICE A DAY 180 tablet 1   amLODipine (NORVASC) 2.5 MG tablet Take 2.5 mg by mouth 2 (two) times daily.     apixaban (ELIQUIS) 2.5 MG TABS tablet Take 1 tablet (2.5 mg total) by mouth 2 (two) times daily. 60 tablet 0   apixaban (ELIQUIS) 2.5 MG TABS tablet Take 2.5 mg by mouth 2 (two) times daily.     atorvastatin (LIPITOR) 40 MG tablet TAKE 1 TABLET DAILY AT 6 P.M. 90 tablet 3   atorvastatin (LIPITOR) 40 MG tablet Take 40 mg by mouth at bedtime.     citalopram (CELEXA) 10 MG tablet TAKE 1 TABLET DAILY 90 tablet 3   gabapentin (NEURONTIN) 300 MG capsule Take 1 capsule (300 mg total) by mouth 3 (three) times daily. 270 capsule 1   gabapentin (NEURONTIN) 300 MG capsule Take 300  mg by mouth 3 (three) times daily.     hyoscyamine (LEVSIN SL) 0.125 MG SL tablet Place 1 tablet (0.125 mg total) under the tongue every 4 (four) hours as needed. Take tablet every 4-6 hours for abdominal pain and cramping 15 tablet 2   Iron-Vitamin C 65-125 MG TABS Take 1 tablet by mouth every morning.     Lancets (ACCU-CHEK MULTICLIX) lancets Use to test blood sugar twice daily. E11.29 100 each 11   levocetirizine (XYZAL) 5 MG tablet TAKE 1 TABLET EVERY EVENING 90 tablet 3   methocarbamol (ROBAXIN) 500 MG tablet Take 1 tablet (500 mg total) by mouth 4 (four) times daily. 45 tablet 0   NONFORMULARY OR COMPOUNDED ITEM Transport chair   #1    dx leg and hip pain s/p fall 1 each 0   NONFORMULARY OR COMPOUNDED ITEM Rollator walker with seat    #1   dx gait abnormality 1 each 0   omeprazole (PRILOSEC) 20 MG capsule TAKE 1 CAPSULE TWICE A DAY 180 capsule 3   omeprazole (PRILOSEC) 20 MG capsule Take 20 mg by mouth 2 (two) times daily.     ondansetron (ZOFRAN ODT) 4 MG disintegrating tablet Take 1 tablet (4 mg total) by mouth every 6 (six) hours. (Patient taking differently: Take 4 mg by mouth as needed.) 15 tablet 5   pioglitazone (ACTOS) 15 MG tablet TAKE  1 TABLET DAILY 90 tablet 3   pioglitazone (ACTOS) 15 MG tablet Take 15 mg by mouth every morning.     triamcinolone cream (KENALOG) 0.5 % Apply 1 application topically 2 (two) times daily. 454 g 1   vitamin B-12 (CYANOCOBALAMIN) 1000 MCG tablet Take 1,000 mcg by mouth daily.     vitamin B-12 (CYANOCOBALAMIN) 1000 MCG tablet Take 1,000 mcg by mouth every morning.     Vitamin D, Ergocalciferol, (DRISDOL) 1.25 MG (50000 UNIT) CAPS capsule TAKE 1 CAPSULE ONCE WEEKLY FOR VITAMIN D 12 capsule 3   Vitamin D, Ergocalciferol, (DRISDOL) 1.25 MG (50000 UNIT) CAPS capsule Take 50,000 Units by mouth every Saturday.     No current facility-administered medications for this visit.     Allergies:  Allergies  Allergen Reactions   Jardiance [Empagliflozin]  Diarrhea   Metformin And Related Diarrhea   Ceclor [Cefaclor] Other (See Comments)    Unknown reaction; not recalled   Cefaclor Other (See Comments)    Unknown reaction   Codeine Nausea And Vomiting   Codeine Nausea And Vomiting   Jardiance [Empagliflozin] Diarrhea   Lopid [Gemfibrozil] Other (See Comments)    Unknown reaction; not recalled   Lopid [Gemfibrozil] Other (See Comments)    Unknown reaction   Lopressor [Metoprolol Tartrate] Nausea And Vomiting   Metformin And Related Diarrhea   Metoprolol Nausea And Vomiting   Morphine And Related Other (See Comments)    Passed out   Morphine And Related Other (See Comments)    Passed out   Niacin Nausea And Vomiting    Unknown reaction Unknown reaction; not recalled   Niacin And Related Other (See Comments)    Unknown reaction; not recalled   Niacin And Related Other (See Comments)    Unknown reaction   Relafen [Nabumetone] Nausea And Vomiting   Relafen [Nabumetone] Nausea And Vomiting   Septra [Sulfamethoxazole-Trimethoprim] Nausea And Vomiting   Septra [Sulfamethoxazole-Trimethoprim] Nausea And Vomiting   Tetracycline Nausea And Vomiting   Tetracyclines & Related Nausea And Vomiting   Toprol Xl [Metoprolol Tartrate] Nausea And Vomiting   Toprol Xl [Metoprolol] Nausea And Vomiting   Adhesive [Tape] Rash and Other (See Comments)    Tears the skin also   Tape Rash and Other (See Comments)    Tears skin     Physical Exam:  Blood pressure (!) 149/86, pulse 71, temperature 97.8 F (36.6 C), temperature source Oral, resp. rate 17, height 5\' 6"  (1.676 m), weight 172 lb 1.6 oz (78.1 kg), SpO2 98 %.   ECOG: 1   General appearance: Alert, awake without any distress. Head: Atraumatic without abnormalities Oropharynx: Without any thrush or ulcers. Eyes: No scleral icterus. Lymph nodes: No lymphadenopathy noted in the cervical, supraclavicular, or axillary nodes Heart:regular rate and rhythm, without any murmurs or gallops.    Lung: Clear to auscultation without any rhonchi, wheezes or dullness to percussion. Abdomin: Soft, nontender without any shifting dullness or ascites. Musculoskeletal: No clubbing or cyanosis. Neurological: No motor or sensory deficits. Skin: No rashes or lesions.      Lab Results: Lab Results  Component Value Date   WBC 9.6 11/20/2020   HGB 12.9 11/20/2020   HGB 12.8 11/20/2020   HCT 38.0 11/20/2020   HCT 39.8 11/20/2020   MCV 92.3 11/20/2020   PLT 220 11/20/2020     Chemistry      Component Value Date/Time   NA 138 11/20/2020 1733   NA 139 11/20/2020 1733   NA 138 08/31/2020 1211  K 4.7 11/20/2020 1733   K 4.7 11/20/2020 1733   CL 105 11/20/2020 1733   CL 108 11/20/2020 1733   CO2 24 11/20/2020 1733   BUN 21 11/20/2020 1733   BUN 28 (H) 11/20/2020 1733   BUN 27 08/31/2020 1211   CREATININE 1.51 (H) 11/20/2020 1733   CREATININE 1.40 (H) 11/20/2020 1733   CREATININE 1.42 (H) 08/09/2019 0903      Component Value Date/Time   CALCIUM 9.7 11/20/2020 1733   ALKPHOS 115 11/20/2020 1733   AST 23 11/20/2020 1733   ALT 11 11/20/2020 1733   BILITOT 1.3 (H) 11/20/2020 1733   BILITOT 0.3 06/23/2015 0927          Impression and Plan:   87 year old with:   1.  Iron deficiency anemia diagnosed in August 2020.  This was found to be related to angiodysplasia and GI blood losses.  Laboratory data from today reviewed and showed slight drop in her hemoglobin but overall asymptomatic and currently on iron replacement.  Risks and benefits of intravenous iron infusion were discussed.  Given the fact that her drop is minor and currently on iron replacement I recommended continued oral iron at this time.  We will continue to monitor her iron stores and replace as needed.  IV iron infusion will be deferred unless she has worsening iron deficiency.   2.  Angiodysplasia of the colon: No active bleeding noted at this time.   3.  Follow-up: In 6 months for repeat evaluation.    30  minutes were dedicated to this visit.  Time spent on reviewing laboratory data, treatment choices and complications related to therapy.    Zola Button, MD 10/5/20229:40 AM

## 2021-01-08 ENCOUNTER — Telehealth: Payer: Self-pay | Admitting: Internal Medicine

## 2021-01-08 NOTE — Telephone Encounter (Signed)
Pt is requesting a refill for atorvastatin (LIPITOR) 40 MG tablet   Ph[facility] [place], [place]   PT also requesting short supply to [facility]  [place], [place]

## 2021-01-09 ENCOUNTER — Other Ambulatory Visit: Payer: Self-pay | Admitting: Internal Medicine

## 2021-01-09 DIAGNOSIS — E785 Hyperlipidemia, unspecified: Secondary | ICD-10-CM

## 2021-01-09 DIAGNOSIS — I6782 Cerebral ischemia: Secondary | ICD-10-CM

## 2021-01-09 MED ORDER — ATORVASTATIN CALCIUM 40 MG PO TABS: 40.0000 mg | ORAL_TABLET | Freq: Every day | ORAL | 1 refills | Status: DC

## 2021-01-10 ENCOUNTER — Ambulatory Visit: Payer: Medicare Other | Admitting: Orthopaedic Surgery

## 2021-01-23 ENCOUNTER — Telehealth: Payer: Self-pay | Admitting: Internal Medicine

## 2021-01-23 NOTE — Telephone Encounter (Signed)
1.Medication Requested: gabapentin (NEURONTIN) 300 MG capsule  2. Pharmacy (Name, Street, [place]): [place], [place]  3. On Med List: yes   4. Last Visit with PCP: 12-26-2020  5. Next visit date with PCP: n/a   Agent: Please be advised that RX refills may take up to 3 business days. We ask that you follow-up with your pharmacy.

## 2021-01-24 ENCOUNTER — Other Ambulatory Visit: Payer: Self-pay | Admitting: Internal Medicine

## 2021-01-24 DIAGNOSIS — E1142 Type 2 diabetes mellitus with diabetic polyneuropathy: Secondary | ICD-10-CM

## 2021-01-24 DIAGNOSIS — M5416 Radiculopathy, lumbar region: Secondary | ICD-10-CM

## 2021-01-24 MED ORDER — GABAPENTIN 300 MG PO CAPS: 300.0000 mg | ORAL_CAPSULE | Freq: Three times a day (TID) | ORAL | 1 refills | Status: DC

## 2021-02-06 ENCOUNTER — Encounter: Payer: Self-pay | Admitting: Orthopaedic Surgery

## 2021-02-06 ENCOUNTER — Ambulatory Visit (INDEPENDENT_AMBULATORY_CARE_PROVIDER_SITE_OTHER): Payer: Medicare Other | Admitting: Orthopaedic Surgery

## 2021-02-06 ENCOUNTER — Other Ambulatory Visit: Payer: Self-pay

## 2021-02-06 VITALS — BP 160/68 | HR 52 | Ht 66.0 in | Wt 172.0 lb

## 2021-02-06 DIAGNOSIS — I639 Cerebral infarction, unspecified: Secondary | ICD-10-CM | POA: Diagnosis not present

## 2021-02-06 DIAGNOSIS — M48062 Spinal stenosis, lumbar region with neurogenic claudication: Secondary | ICD-10-CM

## 2021-02-06 NOTE — Progress Notes (Signed)
Office Visit Note   Patient: Penny Miller           Date of Birth: [birth year]           MRN: [id number] Visit Date: 02/06/2021              Requested by: [name], MD [address] [place],  [place] [address] PCP: [name], MD   Assessment & Plan: Visit Diagnoses:  1. Spinal stenosis, lumbar region with neurogenic claudication     Plan: We discussed there is treatment for problem that require surgery.  She Stopped her Eliquis should be a wrist for epidural hematoma postop which could require reoperation.  She states she is not interested in any surgery we discussed using her walker she is less likely to fall using a folding walker than the one with a hand break in 4 wheels.  MRI scan report was reviewed with patient and her son.  Again with copy of the report she can follow-up if she would like to discuss this further.  Follow-Up Instructions: Return if symptoms worsen or fail to improve.   Orders:  No orders of the defined types were placed in this encounter.  No orders of the defined types were placed in this encounter.     Procedures: No procedures performed   Clinical Data: No additional findings.   Subjective: No chief complaint on file.   HPI 87 year old female with spinal stenosis and mild progression L3-4 marked spinal stenosis and stable marked spinal stenosis at L4-5 with anterolisthesis.  Previous scan 2018 and most recent MRI 12/12/2020.  She had previous lacunar infarct.  She had one episode when she walked 300 feet across parking lot going to a doctor's office and legs gave way she went down to the ground spinning after her son grabbed her as she went down to the ground.  She has a rolling walker with hand break and 1 time she fell forward hitting her head states she could not walk for about a week after that.  She does have diabetes on oral medication.  She can walk under 200 feet she gets relief sitting complete relief supine position.  Does  better using rolling walker.  Patient has cervical disc degeneration.  Stage IV kidney disease.  Patient is on Eliquis.  Review of Systems all the systems updated noncontributory to HPI.   Objective: Vital Signs: BP (!) 160/68   Pulse (!) 52   Ht 5\' 6"  (1.676 m)   Wt 172 lb (78 kg)   BMI 27.76 kg/m   Physical Exam Constitutional:      Appearance: She is well-developed.  HENT:     Head: Normocephalic.     Right Ear: External ear normal.     Left Ear: External ear normal. There is no impacted cerumen.  Eyes:     Pupils: Pupils are equal, round, and reactive to light.  Neck:     Thyroid: No thyromegaly.     Trachea: No tracheal deviation.  Cardiovascular:     Rate and Rhythm: Normal rate.  Pulmonary:     Effort: Pulmonary effort is normal.  Abdominal:     Palpations: Abdomen is soft.  Musculoskeletal:     Cervical back: No rigidity.  Skin:    General: Skin is warm and dry.  Neurological:     Mental Status: She is alert and oriented to person, place, and time.  Psychiatric:        Behavior:  Behavior normal.    Ortho Exam mild lower extremity edema.  She can ambulate 5 or 6 steps and back across the exam room without using a rolling walker with reverse seat.  Anterior tib gastrocsoleus is active palpable posterior tib pulse right and left.  Specialty Comments:  No specialty comments available.  Imaging: Narrative & Impression  CLINICAL DATA:  Numbness or tingling, paresthesia. Recent falling. Left leg pain with back pain and numbness. Left leg weakness.   EXAM: MRI LUMBAR SPINE WITHOUT CONTRAST   TECHNIQUE: Multiplanar, multisequence MR imaging of the lumbar spine was performed. No intravenous contrast was administered.   COMPARISON:  CT 11/20/2020.  MRI 09/13/2016.   FINDINGS: Segmentation:  5 lumbar type vertebral bodies.   Alignment: Mild scoliotic curvature convex to the left. 2 mm anterolisthesis L3-4. 4 mm anterolisthesis L4-5.   Vertebrae: Chronic  discogenic endplate marrow changes throughout the lumbar region.   Conus medullaris and cauda equina: Conus extends to the L1 level. Conus and cauda equina appear normal.   Paraspinal and other soft tissues: Negative   Disc levels:   T11-12, T12-L1 and L1-2: Minimal, non-compressive disc bulges.   L2-3: Chronic disc degeneration with loss of disc height. Endplate osteophytes and bulging of the disc. Mild facet and ligamentous hypertrophy. Mild narrowing of the right lateral recess and intervertebral foramen on the right but without visible neural compression.   L3-4: 2 mm of anterolisthesis. Broad-based disc protrusion. Facet and ligamentous hypertrophy. Moderate to marked multifactorial spinal stenosis, worsened compared to the study of 2018.   L4-5: Chronic facet arthropathy with 4 mm of anterolisthesis. Disc degeneration with loss of disc height. Chronic shallow protrusion of disc material. Multifactorial spinal stenosis that could cause neural compression on either or both sides. Bilateral foraminal stenosis, right worse than left. Similar appearance to the study of 2018.   L5-S1: Shallow protrusion of the disc. Mild facet and ligamentous hypertrophy. No compressive canal stenosis. Mild left foraminal narrowing, unchanged since 2018.   IMPRESSION: L3-4: Moderate to marked multifactorial stenosis that could cause neural compression on either or both sides. Slight worsening since the study of 2018.   L4-5: Moderate to marked multifactorial spinal stenosis that could cause neural compression on either or both sides, similar to the study of 2018. Foraminal stenosis right worse than left.   L2-3: Chronic disc degeneration and facet degeneration with mild right lateral recess narrowing.   L5-S1: Shallow disc protrusion and facet degeneration. Mild left foraminal narrowing, unchanged since 2018.     Electronically Signed   By: Nelson Chimes M.D.   On: 12/12/2020 14:08        PMFS History: Patient Active Problem List   Diagnosis Date Noted   Dysuria 12/26/2020   OAB (overactive bladder) 12/26/2020   Acute pain of left thigh 11/20/2020   Weakness of left leg 11/20/2020   Acute left-sided low back pain with left-sided sciatica 11/20/2020   Chronic cerebral ischemia 11/20/2020   Frequent falls 11/10/2020   Chronic renal disease, stage 4, severely decreased glomerular filtration rate (GFR) between 15-29 mL/min/1.73 square meter ([place]) 07/22/2020   Seasonal allergic rhinitis due to pollen 02/03/2020   Intrinsic eczema 02/03/2020   Incomplete emptying of bladder 10/01/2019   Acquired hypothyroidism 08/15/2019   Iron deficiency anemia due to chronic blood loss 08/15/2019   Spinal stenosis, lumbar region with neurogenic claudication 05/26/2018   Spinal stenosis of lumbar region with neurogenic claudication 07/21/2017   Vertigo of central origin 07/21/2017   Benign hypertension  07/21/2017   Overweight (BMI 25.0-29.9) 11/07/2016   Paroxysmal atrial fibrillation ([place]) 09/03/2016   Diabetic polyneuropathy associated with type 2 diabetes mellitus ([place]) 09/03/2016   Aneurysm of vertebral artery ([place]) 62/94/7654   Embolic stroke involving left cerebellar artery ([place]) 07/29/2016   DM (diabetes mellitus), type 2 with renal complications ([place]) 65/05/5463   Varicose vein of leg 12/21/2013   Bladder infection, chronic 01/04/2013   Polyneuropathy in diabetes(357.2)    Insomnia, unspecified    Essential hypertension    Hyperlipidemia    Chronic interstitial cystitis    Past Medical History:  Diagnosis Date   Allergic rhinitis, cause unspecified    Anxiety state, unspecified    Arthritis    Colon polyp    Diaphragmatic hernia without mention of obstruction or gangrene    Diverticulosis of colon (without mention of hemorrhage)    Female stress incontinence    HTN (hypertension)    Insomnia, unspecified    Interstitial cystitis    surgery 02/01/2013    Irritable bowel syndrome    Memory loss    Other and unspecified hyperlipidemia    Other premature beats    Polyneuropathy in diabetes(357.2)    Postmenopausal bleeding    Proteinuria    Rectal ulcer    Skin cancer    Stroke ([place]) 2018   Type II or unspecified type diabetes mellitus with neurological manifestations, not stated as uncontrolled(250.60)    Unspecified essential hypertension    Viral warts, unspecified     Family History  Problem Relation Age of Onset   Pancreatic cancer Mother 104   Heart attack Father    Bladder Cancer Father    Hypertension Sister    Diabetes Sister    COPD Sister    Hypertension Son    Diabetes Brother    Heart disease Brother    Hypertension Brother    Diabetes Brother    Heart disease Brother 70       Myocardial Infarction    Heart attack Brother     Past Surgical History:  Procedure Laterality Date   ABDOMINAL HYSTERECTOMY  1977   [place]   CATARACT EXTRACTION, BILATERAL  02/10/2006   Dr.Groat    COLONOSCOPY  07/17/2004   polypectomy   CYSTOSCOPY  1987   Dr.Bradley   LOOP RECORDER INSERTION N/A 07/31/2016   Procedure: Loop Recorder Insertion;  Surgeon: [name], MD;  Location: [facility];  Service: Cardiovascular;  Laterality: N/A;   TEE WITHOUT CARDIOVERSION N/A 07/30/2016   Procedure: TRANSESOPHAGEAL ECHOCARDIOGRAM (TEE);  Surgeon: [name], MD;  Location: [facility];  Service: Cardiovascular;  Laterality: N/A;   [place]  05/13/16   urinary bladder      surgery 02/01/2013 to stretch bladder stem   Social History   Occupational History   Occupation: Pharmacist, 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

## 2021-02-07 ENCOUNTER — Telehealth: Payer: Self-pay | Admitting: Internal Medicine

## 2021-02-07 NOTE — Telephone Encounter (Signed)
1.Medication Requested: pioglitazone (ACTOS) 15 MG tablet Vitamin D, Ergocalciferol, (DRISDOL) 1.25 MG (50000 UNIT) CAPS capsule   2. Pharmacy (Name, [place], [facility]): [place] [place]  Phone:  [phone] Fax:  [phone]   3. On Med List: yes  4. Last Visit with PCP: 09.27.22  5. Next visit date with PCP: n/a   Agent: Please be advised that RX refills may take up to 3 business days. We ask that you follow-up with your pharmacy.

## 2021-02-08 ENCOUNTER — Other Ambulatory Visit: Payer: Self-pay | Admitting: Internal Medicine

## 2021-02-08 DIAGNOSIS — E1122 Type 2 diabetes mellitus with diabetic chronic kidney disease: Secondary | ICD-10-CM

## 2021-02-08 DIAGNOSIS — E559 Vitamin D deficiency, unspecified: Secondary | ICD-10-CM

## 2021-02-08 MED ORDER — PIOGLITAZONE HCL 15 MG PO TABS: 15.0000 mg | ORAL_TABLET | Freq: Every day | ORAL | 1 refills | Status: DC

## 2021-02-08 MED ORDER — VITAMIN D (ERGOCALCIFEROL) 1.25 MG (50000 UNIT) PO CAPS: 50000.0000 [IU] | ORAL_CAPSULE | ORAL | 0 refills | Status: DC

## 2021-02-12 ENCOUNTER — Other Ambulatory Visit (HOSPITAL_COMMUNITY): Payer: Self-pay | Admitting: Internal Medicine

## 2021-02-12 NOTE — Telephone Encounter (Signed)
Eliquis 2.5mg  refill request received. Patient is 87 years old, weight-78kg, Crea-1.51 on 11/20/2020, Diagnosis-Afib, and last seen by Oda Kilts, PA on 11/06/2020. Dose is appropriate based on dosing criteria. Will send in refill to requested pharmacy.

## 2021-02-13 ENCOUNTER — Other Ambulatory Visit: Payer: Self-pay

## 2021-02-13 DIAGNOSIS — E559 Vitamin D deficiency, unspecified: Secondary | ICD-10-CM

## 2021-02-13 DIAGNOSIS — E1122 Type 2 diabetes mellitus with diabetic chronic kidney disease: Secondary | ICD-10-CM

## 2021-02-13 DIAGNOSIS — N1831 Chronic kidney disease, stage 3a: Secondary | ICD-10-CM

## 2021-02-13 MED ORDER — PIOGLITAZONE HCL 15 MG PO TABS: 15.0000 mg | ORAL_TABLET | Freq: Every day | ORAL | 1 refills | Status: DC

## 2021-02-13 MED ORDER — VITAMIN D (ERGOCALCIFEROL) 1.25 MG (50000 UNIT) PO CAPS: 50000.0000 [IU] | ORAL_CAPSULE | ORAL | 0 refills | Status: DC

## 2021-02-13 NOTE — Telephone Encounter (Signed)
Patient requesting rx sent to   [facility], [place]

## 2021-02-13 NOTE — Telephone Encounter (Signed)
done

## 2021-02-14 ENCOUNTER — Telehealth: Payer: Self-pay | Admitting: Internal Medicine

## 2021-02-14 NOTE — Telephone Encounter (Signed)
1.Medication Requested: citalopram (CELEXA) 10 MG tablet  2. Pharmacy (Name, [facility] [place]): [facility] [place], [place]   3. On Med List: yes  4. Last Visit with PCP: 12-06-2020  5. Next visit date with PCP: n/a  Patient is requesting short fill sent to [facility], [place]  Until remainder of rx is delivered by [facility], [place]

## 2021-02-15 ENCOUNTER — Other Ambulatory Visit: Payer: Self-pay | Admitting: Internal Medicine

## 2021-02-15 DIAGNOSIS — F325 Major depressive disorder, single episode, in full remission: Secondary | ICD-10-CM

## 2021-02-15 MED ORDER — CITALOPRAM HYDROBROMIDE 10 MG PO TABS: 10.0000 mg | ORAL_TABLET | Freq: Every day | ORAL | 1 refills | Status: DC

## 2021-02-20 ENCOUNTER — Other Ambulatory Visit: Payer: Self-pay | Admitting: Internal Medicine

## 2021-02-20 DIAGNOSIS — L2084 Intrinsic (allergic) eczema: Secondary | ICD-10-CM

## 2021-02-21 ENCOUNTER — Other Ambulatory Visit: Payer: Self-pay

## 2021-02-21 ENCOUNTER — Ambulatory Visit (INDEPENDENT_AMBULATORY_CARE_PROVIDER_SITE_OTHER): Payer: Medicare Other | Admitting: Podiatry

## 2021-02-21 ENCOUNTER — Other Ambulatory Visit: Payer: Self-pay | Admitting: Internal Medicine

## 2021-02-21 ENCOUNTER — Encounter: Payer: Self-pay | Admitting: Podiatry

## 2021-02-21 DIAGNOSIS — M79675 Pain in left toe(s): Secondary | ICD-10-CM

## 2021-02-21 DIAGNOSIS — M79674 Pain in right toe(s): Secondary | ICD-10-CM

## 2021-02-21 DIAGNOSIS — B351 Tinea unguium: Secondary | ICD-10-CM | POA: Diagnosis not present

## 2021-02-21 DIAGNOSIS — L84 Corns and callosities: Secondary | ICD-10-CM

## 2021-02-21 DIAGNOSIS — E119 Type 2 diabetes mellitus without complications: Secondary | ICD-10-CM

## 2021-02-21 DIAGNOSIS — E1142 Type 2 diabetes mellitus with diabetic polyneuropathy: Secondary | ICD-10-CM

## 2021-02-21 DIAGNOSIS — F325 Major depressive disorder, single episode, in full remission: Secondary | ICD-10-CM

## 2021-02-21 MED ORDER — CITALOPRAM HYDROBROMIDE 10 MG PO TABS: 10.0000 mg | ORAL_TABLET | Freq: Every day | ORAL | 1 refills | Status: DC

## 2021-02-21 NOTE — Progress Notes (Signed)
ANNUAL DIABETIC FOOT EXAM  Subjective: Penny Miller presents today for for annual diabetic foot examination.  Patient denies any h/o foot wounds. She does relate h/o fall in August and was treated by her PCP. She has bruising on lower extremities which are resolving. She denies any LE pain.   Patient has h/o sciatica.  Patient's blood sugar was 128 mg/dl last night. Patient did not check blood glucose this morning. She does have h/o neuropathy.  Penny Lima, MD is patient's PCP. Last visit was 12/26/2020.  Past Medical History:  Diagnosis Date   Allergic rhinitis, cause unspecified    Anxiety state, unspecified    Arthritis    Colon polyp    Diaphragmatic hernia without mention of obstruction or gangrene    Diverticulosis of colon (without mention of hemorrhage)    Female stress incontinence    HTN (hypertension)    Insomnia, unspecified    Interstitial cystitis    surgery 02/01/2013   Irritable bowel syndrome    Memory loss    Other and unspecified hyperlipidemia    Other premature beats    Polyneuropathy in diabetes(357.2)    Postmenopausal bleeding    Proteinuria    Rectal ulcer    Skin cancer    Stroke ([place]) 2018   Type II or unspecified type diabetes mellitus with neurological manifestations, not stated as uncontrolled(250.60)    Unspecified essential hypertension    Viral warts, unspecified    Patient Active Problem List   Diagnosis Date Noted   Dysuria 12/26/2020   OAB (overactive bladder) 12/26/2020   Acute pain of left thigh 11/20/2020   Weakness of left leg 11/20/2020   Acute left-sided low back pain with left-sided sciatica 11/20/2020   Chronic cerebral ischemia 11/20/2020   Frequent falls 11/10/2020   Chronic renal disease, stage 4, severely decreased glomerular filtration rate (GFR) between 15-29 mL/min/1.73 square meter ([place]) 07/22/2020   Seasonal allergic rhinitis due to pollen 02/03/2020   Intrinsic eczema 02/03/2020   Incomplete emptying of  bladder 10/01/2019   Acquired hypothyroidism 08/15/2019   Iron deficiency anemia due to chronic blood loss 08/15/2019   Spinal stenosis, lumbar region with neurogenic claudication 05/26/2018   Spinal stenosis of lumbar region with neurogenic claudication 07/21/2017   Vertigo of central origin 07/21/2017   Benign hypertension 07/21/2017   Overweight (BMI 25.0-29.9) 11/07/2016   Paroxysmal atrial fibrillation ([place]) 09/03/2016   Diabetic polyneuropathy associated with type 2 diabetes mellitus ([place]) 09/03/2016   Aneurysm of vertebral artery ([place]) 78/24/2353   Embolic stroke involving left cerebellar artery ([employer]) 07/29/2016   DM (diabetes mellitus), type 2 with renal complications ([place]) 61/44/3154   Varicose vein of leg 12/21/2013   Bladder infection, chronic 01/04/2013   Polyneuropathy in diabetes(357.2)    Insomnia, unspecified    Essential hypertension    Hyperlipidemia    Chronic interstitial cystitis    Past Surgical History:  Procedure Laterality Date   ABDOMINAL HYSTERECTOMY  1977   Dr.Hambright   APPENDECTOMY  1965   CATARACT EXTRACTION, BILATERAL  02/10/2006   Dr.Groat    COLONOSCOPY  07/17/2004   polypectomy   CYSTOSCOPY  1987   Dr.Bradley   LOOP RECORDER INSERTION N/A 07/31/2016   Procedure: Loop Recorder Insertion;  Surgeon: [name], MD;  Location: [facility];  Service: Cardiovascular;  Laterality: N/A;   TEE WITHOUT CARDIOVERSION N/A 07/30/2016   Procedure: TRANSESOPHAGEAL ECHOCARDIOGRAM (TEE);  Surgeon: [name], MD;  Location: [facility];  Service: Cardiovascular;  Laterality: N/A;   TRIGGER FINGER RELEASE  1990   Dr.Carter   TRIGGER FINGER RELEASE  05/13/16   urinary bladder      surgery 02/01/2013 to stretch bladder stem   Current Outpatient Medications on File Prior to Visit  Medication Sig Dispense Refill   ACCU-CHEK AVIVA PLUS test strip USE TO TEST BLOOD SUGAR TWICE DAILY. 100 each 11   acetaminophen (TYLENOL) 500 MG tablet Take 500 mg by  mouth every 6 (six) hours as needed (pain).     amLODipine (NORVASC) 2.5 MG tablet TAKE 1 TABLET TWICE A DAY 180 tablet 1   apixaban (ELIQUIS) 2.5 MG TABS tablet TAKE 1 TABLET TWICE A DAY 180 tablet 1   atorvastatin (LIPITOR) 40 MG tablet Take 1 tablet (40 mg total) by mouth at bedtime. 90 tablet 1   gabapentin (NEURONTIN) 300 MG capsule Take 1 capsule (300 mg total) by mouth 3 (three) times daily. 270 capsule 1   hyoscyamine (LEVSIN SL) 0.125 MG SL tablet Place 1 tablet (0.125 mg total) under the tongue every 4 (four) hours as needed. Take tablet every 4-6 hours for abdominal pain and cramping 15 tablet 2   Iron-Vitamin C 65-125 MG TABS Take 1 tablet by mouth every morning.     Lancets (ACCU-CHEK MULTICLIX) lancets Use to test blood sugar twice daily. E11.29 100 each 11   levocetirizine (XYZAL) 5 MG tablet TAKE 1 TABLET EVERY EVENING 90 tablet 3   methocarbamol (ROBAXIN) 500 MG tablet Take 1 tablet (500 mg total) by mouth 4 (four) times daily. 45 tablet 0   NONFORMULARY OR COMPOUNDED ITEM Transport chair   #1    dx leg and hip pain s/p fall 1 each 0   NONFORMULARY OR COMPOUNDED ITEM Rollator walker with seat    #1   dx gait abnormality 1 each 0   omeprazole (PRILOSEC) 20 MG capsule TAKE 1 CAPSULE TWICE A DAY 180 capsule 3   ondansetron (ZOFRAN ODT) 4 MG disintegrating tablet Take 1 tablet (4 mg total) by mouth every 6 (six) hours. (Patient taking differently: Take 4 mg by mouth as needed.) 15 tablet 5   pioglitazone (ACTOS) 15 MG tablet Take 1 tablet (15 mg total) by mouth daily. 90 tablet 1   triamcinolone cream (KENALOG) 0.5 % APPLY 1 APPLICATION TOPICALLY TWICE A DAY 450 g 3   vitamin B-12 (CYANOCOBALAMIN) 1000 MCG tablet Take 1,000 mcg by mouth daily.     Vitamin D, Ergocalciferol, (DRISDOL) 1.25 MG (50000 UNIT) CAPS capsule Take 1 capsule (50,000 Units total) by mouth every 7 (seven) days. 12 capsule 0   No current facility-administered medications on file prior to visit.    Allergies   Allergen Reactions   Jardiance [Empagliflozin] Diarrhea   Metformin And Related Diarrhea   Ceclor [Cefaclor] Other (See Comments)    Unknown reaction; not recalled   Cefaclor Other (See Comments)    Unknown reaction   Codeine Nausea And Vomiting   Codeine Nausea And Vomiting   Jardiance [Empagliflozin] Diarrhea   Lopid [Gemfibrozil] Other (See Comments)    Unknown reaction; not recalled   Lopid [Gemfibrozil] Other (See Comments)    Unknown reaction   Lopressor [Metoprolol Tartrate] Nausea And Vomiting   Metformin And Related Diarrhea   Metoprolol Nausea And Vomiting   Morphine And Related Other (See Comments)    Passed out   Morphine And Related Other (See Comments)    Passed out   Niacin Nausea And Vomiting    Unknown reaction  Unknown reaction; not recalled   Niacin And Related Other (See Comments)    Unknown reaction; not recalled   Niacin And Related Other (See Comments)    Unknown reaction   Relafen [Nabumetone] Nausea And Vomiting   Relafen [Nabumetone] Nausea And Vomiting   Septra [Sulfamethoxazole-Trimethoprim] Nausea And Vomiting   Septra [Sulfamethoxazole-Trimethoprim] Nausea And Vomiting   Tetracycline Nausea And Vomiting   Tetracyclines & Related Nausea And Vomiting   Toprol Xl [Metoprolol Tartrate] Nausea And Vomiting   Toprol Xl [Metoprolol] Nausea And Vomiting   Adhesive [Tape] Rash and Other (See Comments)    Tears the skin also   Tape Rash and Other (See Comments)    Tears skin   Social History   Occupational History   Occupation: Pharmacist, 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   Tobacco Use   Smoking status: Former    Packs/day: 0.25    Years: 62.00    Pack years: 15.50    Types: Cigarettes    Quit date: 04/02/2011    Years since quitting: 9.9   Smokeless tobacco: Never   Tobacco comments:    stopped consistently smoking in 2012 but still smokes occasionally (one pack per week)  Vaping Use   Vaping Use: Never used  Substance and Sexual Activity    Alcohol use: No   Drug use: No   Sexual activity: Not Currently   Family History  Problem Relation Age of Onset   Pancreatic cancer Mother 25   Heart attack Father    Bladder Cancer Father    Hypertension Sister    Diabetes Sister    COPD Sister    Hypertension Son    Diabetes Brother    Heart disease Brother    Hypertension Brother    Diabetes Brother    Heart disease Brother 28       Myocardial Infarction    Heart attack Brother    Immunization History  Administered Date(s) Administered   Fluad Quad(high Dose 65+) 02/03/2020, 12/26/2020   Influenza, High Dose Seasonal PF 12/31/2016, 12/15/2017, 01/25/2019   Influenza,inj,Quad PF,6+ Mos 12/15/2012, 12/21/2013, 02/28/2015, 02/02/2016   Influenza-Unspecified 01/22/2012   PFIZER(Purple Top)SARS-COV-2 Vaccination 05/14/2019, 06/08/2019   Pneumococcal Conjugate-13 05/07/2016   Pneumococcal Polysaccharide-23 11/03/2019   Pneumococcal-Unspecified 01/24/1995   Td 09/14/2008   Tdap 01/25/2020     Review of Systems: Negative except as noted in the HPI.   Objective: There were no vitals filed for this visit.  Penny Miller is a pleasant 87 y.o. female in NAD. AAO X 3.  Vascular Examination: CFT <4 seconds b/l LE. Palpable DP pulse(s) b/l LE. Palpable PT pulse(s) b/l LE. Pedal hair absent. No pain with calf compression RLE. Trace edema noted BLE. Varicosities present b/l. No cyanosis or clubbing noted b/l LE.  Dermatological Examination: Pedal skin is warm and supple b/l LE. Resolving ecchymosis noted dorsal aspect of left foot. No open wounds b/l LE. No interdigital macerations noted b/l LE. Toenails 1-5 b/l elongated, discolored, dystrophic, thickened, crumbly with subungual debris and tenderness to dorsal palpation. Hyperkeratotic lesion(s) bilateral great toes.  No erythema, no edema, no drainage, no fluctuance.  Musculoskeletal Examination: Muscle strength 5/5 to all lower extremity muscle groups bilaterally. Wearing [employer] shoes. No pain, crepitus or joint limitation noted with ROM bilateral LE. No gross bony deformities bilaterally. Utilizes rollator for ambulation assistance.  Footwear Assessment: Does the patient wear appropriate shoes? Yes. Does the patient need inserts/orthotics? No.  Neurological Examination: Pt has subjective symptoms of  neuropathy. Protective sensation intact 5/5 intact bilaterally with 10g monofilament b/l. Vibratory sensation intact b/l. Proprioception intact bilaterally.  Hemoglobin A1C Latest Ref Rng & Units 07/19/2020  HGBA1C 4.6 - 6.5 % 6.7(H)  Some recent data might be hidden   Assessment: 1. Pain due to onychomycosis of toenails of both feet   2. Callus   3. Diabetic peripheral neuropathy ([place])   4. Encounter for diabetic foot exam ([place])     ADA Risk Categorization: Low Risk :  Patient has all of the following: Intact protective sensation No prior foot ulcer  No severe deformity Pedal pulses present  Plan: -Diabetic foot examination performed today. -Continue diabetic foot care principles: inspect feet daily, monitor glucose as recommended by PCP and/or Endocrinologist, and follow prescribed diet per PCP, Endocrinologist and/or dietician. -Mycotic toenails 1-5 bilaterally were debrided in length and girth with sterile nail nippers and dremel without incident. -Callus(es) bilateral great toes pared utilizing sterile scalpel blade without complication or incident. Total number debrided =2. -Patient/POA to call should there be question/concern in the interim.  Return in about 3 months (around 05/24/2021).  Penny Miller, DPM

## 2021-02-24 ENCOUNTER — Encounter: Payer: Self-pay | Admitting: Podiatry

## 2021-03-01 DIAGNOSIS — I129 Hypertensive chronic kidney disease with stage 1 through stage 4 chronic kidney disease, or unspecified chronic kidney disease: Secondary | ICD-10-CM | POA: Diagnosis not present

## 2021-03-01 DIAGNOSIS — R319 Hematuria, unspecified: Secondary | ICD-10-CM | POA: Diagnosis not present

## 2021-03-01 DIAGNOSIS — E1122 Type 2 diabetes mellitus with diabetic chronic kidney disease: Secondary | ICD-10-CM | POA: Diagnosis not present

## 2021-03-01 DIAGNOSIS — D509 Iron deficiency anemia, unspecified: Secondary | ICD-10-CM | POA: Diagnosis not present

## 2021-03-01 DIAGNOSIS — N2581 Secondary hyperparathyroidism of renal origin: Secondary | ICD-10-CM | POA: Diagnosis not present

## 2021-03-01 DIAGNOSIS — D1771 Benign lipomatous neoplasm of kidney: Secondary | ICD-10-CM | POA: Diagnosis not present

## 2021-03-01 DIAGNOSIS — N1832 Chronic kidney disease, stage 3b: Secondary | ICD-10-CM | POA: Diagnosis not present

## 2021-03-09 ENCOUNTER — Telehealth: Payer: Self-pay | Admitting: Internal Medicine

## 2021-03-09 NOTE — Telephone Encounter (Signed)
Pt  granddaughter has called in and is requesting an antibiotic for mucus in lungs, stuffy nose and ears, possible bronchitis. Told caller that we do not have any availability until next week, with any of our providers. Offered to schedule appt, it was declined. Checked all other offices, no availability.    Callback #- [phone]

## 2021-03-12 NOTE — Telephone Encounter (Signed)
Pt scheduled 12/13 @ 4pm

## 2021-03-12 NOTE — Telephone Encounter (Signed)
Pt's granddaughter has been informed an OV would be needed. However, no availability across this office until Wed. Pt has already went through the weekend with these issues. Please advise on how to proceed. Thanks!

## 2021-03-13 ENCOUNTER — Encounter: Payer: Self-pay | Admitting: Internal Medicine

## 2021-03-13 ENCOUNTER — Ambulatory Visit (INDEPENDENT_AMBULATORY_CARE_PROVIDER_SITE_OTHER): Payer: Medicare Other | Admitting: Internal Medicine

## 2021-03-13 ENCOUNTER — Ambulatory Visit (INDEPENDENT_AMBULATORY_CARE_PROVIDER_SITE_OTHER): Payer: Medicare Other

## 2021-03-13 ENCOUNTER — Other Ambulatory Visit: Payer: Self-pay

## 2021-03-13 VITALS — BP 166/80 | HR 63 | Temp 98.2°F | Ht 66.0 in | Wt 168.0 lb

## 2021-03-13 DIAGNOSIS — R052 Subacute cough: Secondary | ICD-10-CM

## 2021-03-13 DIAGNOSIS — I517 Cardiomegaly: Secondary | ICD-10-CM | POA: Diagnosis not present

## 2021-03-13 DIAGNOSIS — R059 Cough, unspecified: Secondary | ICD-10-CM | POA: Diagnosis not present

## 2021-03-13 DIAGNOSIS — I639 Cerebral infarction, unspecified: Secondary | ICD-10-CM | POA: Diagnosis not present

## 2021-03-13 DIAGNOSIS — J22 Unspecified acute lower respiratory infection: Secondary | ICD-10-CM

## 2021-03-13 MED ORDER — PROMETHAZINE-DM 6.25-15 MG/5ML PO SYRP: 5.0000 mL | ORAL_SOLUTION | Freq: Four times a day (QID) | ORAL | 0 refills | Status: AC | PRN

## 2021-03-13 MED ORDER — AZITHROMYCIN 500 MG PO TABS: 500.0000 mg | ORAL_TABLET | Freq: Every day | ORAL | 0 refills | Status: AC

## 2021-03-13 NOTE — Patient Instructions (Signed)

## 2021-03-13 NOTE — Progress Notes (Signed)
Subjective:  Patient ID: Penny Miller, female    DOB: 25-Dec-1931  Age: 87 y.o. MRN: [id number]  CC: Cough  This visit occurred during the SARS-CoV-2 public health emergency.  Safety protocols were in place, including screening questions prior to the visit, additional usage of staff PPE, and extensive cleaning of exam room while observing appropriate contact time as indicated for disinfecting solutions.    HPI [name] presents for f/up -  She complains of a 1 week history of sore throat and cough productive of thick yellow phlegm with shortness of breath and chills but no fever or wheezing.  Outpatient Medications Prior to Visit  Medication Sig Dispense Refill   ACCU-CHEK AVIVA PLUS test strip USE TO TEST BLOOD SUGAR TWICE DAILY. 100 each 11   acetaminophen (TYLENOL) 500 MG tablet Take 500 mg by mouth every 6 (six) hours as needed (pain).     amLODipine (NORVASC) 2.5 MG tablet TAKE 1 TABLET TWICE A DAY 180 tablet 1   apixaban (ELIQUIS) 2.5 MG TABS tablet TAKE 1 TABLET TWICE A DAY 180 tablet 1   atorvastatin (LIPITOR) 40 MG tablet Take 1 tablet (40 mg total) by mouth at bedtime. 90 tablet 1   citalopram (CELEXA) 10 MG tablet Take 1 tablet (10 mg total) by mouth daily. 90 tablet 1   gabapentin (NEURONTIN) 300 MG capsule Take 1 capsule (300 mg total) by mouth 3 (three) times daily. 270 capsule 1   hyoscyamine (LEVSIN SL) 0.125 MG SL tablet Place 1 tablet (0.125 mg total) under the tongue every 4 (four) hours as needed. Take tablet every 4-6 hours for abdominal pain and cramping 15 tablet 2   Iron-Vitamin C 65-125 MG TABS Take 1 tablet by mouth every morning.     Lancets (ACCU-CHEK MULTICLIX) lancets Use to test blood sugar twice daily. E11.29 100 each 11   levocetirizine (XYZAL) 5 MG tablet TAKE 1 TABLET EVERY EVENING 90 tablet 3   methocarbamol (ROBAXIN) 500 MG tablet Take 1 tablet (500 mg total) by mouth 4 (four) times daily. 45 tablet 0   NONFORMULARY OR COMPOUNDED ITEM Transport  chair   #1    dx leg and hip pain s/p fall 1 each 0   NONFORMULARY OR COMPOUNDED ITEM Rollator walker with seat    #1   dx gait abnormality 1 each 0   omeprazole (PRILOSEC) 20 MG capsule TAKE 1 CAPSULE TWICE A DAY 180 capsule 3   ondansetron (ZOFRAN ODT) 4 MG disintegrating tablet Take 1 tablet (4 mg total) by mouth every 6 (six) hours. (Patient taking differently: Take 4 mg by mouth as needed.) 15 tablet 5   pioglitazone (ACTOS) 15 MG tablet Take 1 tablet (15 mg total) by mouth daily. 90 tablet 1   triamcinolone cream (KENALOG) 0.5 % APPLY 1 APPLICATION TOPICALLY TWICE A DAY 450 g 3   vitamin B-12 (CYANOCOBALAMIN) 1000 MCG tablet Take 1,000 mcg by mouth daily.     Vitamin D, Ergocalciferol, (DRISDOL) 1.25 MG (50000 UNIT) CAPS capsule Take 1 capsule (50,000 Units total) by mouth every 7 (seven) days. 12 capsule 0   No facility-administered medications prior to visit.    ROS Review of Systems  Constitutional:  Positive for chills. Negative for diaphoresis, fatigue and fever.  HENT:  Positive for sore throat. Negative for sinus pressure.   Respiratory:  Positive for cough. Negative for chest tightness, shortness of breath and wheezing.   Cardiovascular:  Negative for chest pain, palpitations and leg swelling.  Gastrointestinal:  Negative for abdominal pain, constipation, diarrhea, nausea and vomiting.  Genitourinary:  Negative for difficulty urinating and dysuria.  Musculoskeletal:  Negative for arthralgias.  Skin: Negative.   Neurological: Negative.  Negative for dizziness, weakness and light-headedness.  Hematological:  Negative for adenopathy. Does not bruise/bleed easily.  Psychiatric/Behavioral: Negative.     Objective:  BP (!) 166/80 (BP Location: Left Arm, Patient Position: Sitting, Cuff Size: Large)    Pulse 63    Temp 98.2 F (36.8 C) (Oral)    Ht 5\' 6"  (1.676 m)    Wt 168 lb (76.2 kg)    SpO2 95%    BMI 27.12 kg/m   BP Readings from Last 3 Encounters:  03/13/21 (!) 166/80   02/06/21 (!) 160/68  01/03/21 (!) 149/86    Wt Readings from Last 3 Encounters:  03/13/21 168 lb (76.2 kg)  02/06/21 172 lb (78 kg)  01/03/21 172 lb 1.6 oz (78.1 kg)    Physical Exam Constitutional:      General: She is not in acute distress.    Appearance: She is not ill-appearing, toxic-appearing or diaphoretic.  HENT:     Nose: Nose normal.     Mouth/Throat:     Mouth: Mucous membranes are moist.  Eyes:     General: No scleral icterus.    Conjunctiva/sclera: Conjunctivae normal.  Cardiovascular:     Rate and Rhythm: Normal rate and regular rhythm.     Heart sounds: No murmur heard. Pulmonary:     Effort: Pulmonary effort is normal.     Breath sounds: No stridor. No wheezing, rhonchi or rales.  Abdominal:     General: Abdomen is protuberant. Bowel sounds are normal. There is no distension.     Palpations: Abdomen is soft. There is no hepatomegaly, splenomegaly or mass.     Tenderness: There is no abdominal tenderness. There is no guarding.  Musculoskeletal:        General: Normal range of motion.     Cervical back: Neck supple.     Right lower leg: No edema.     Left lower leg: No edema.  Lymphadenopathy:     Cervical: No cervical adenopathy.  Skin:    General: Skin is warm and dry.  Neurological:     General: No focal deficit present.     Mental Status: She is alert.  Psychiatric:        Mood and Affect: Mood normal.        Behavior: Behavior normal.    Lab Results  Component Value Date   WBC 8.4 01/03/2021   HGB 11.5 (L) 01/03/2021   HCT 35.6 (L) 01/03/2021   PLT 178 01/03/2021   GLUCOSE 87 11/20/2020   GLUCOSE 87 11/20/2020   CHOL 160 08/09/2019   TRIG 161 (H) 08/09/2019   HDL 51 08/09/2019   LDLCALC 83 08/09/2019   ALT 11 11/20/2020   AST 23 11/20/2020   NA 138 11/20/2020   NA 139 11/20/2020   K 4.7 11/20/2020   K 4.7 11/20/2020   CL 105 11/20/2020   CL 108 11/20/2020   CREATININE 1.51 (H) 11/20/2020   CREATININE 1.40 (H) 11/20/2020   BUN  21 11/20/2020   BUN 28 (H) 11/20/2020   CO2 24 11/20/2020   TSH 4.00 07/19/2020   INR 1.0 11/20/2020   HGBA1C 6.7 (H) 07/19/2020   MICROALBUR 9.8 08/09/2019    MR Brain Wo Contrast  Result Date: 12/12/2020 CLINICAL DATA:  Neurological deficit. Stroke suspected. Recent  falling. Left leg pain, weakness and numbness. EXAM: MRI HEAD WITHOUT CONTRAST TECHNIQUE: Multiplanar, multiecho pulse sequences of the brain and surrounding structures were obtained without intravenous contrast. COMPARISON:  Head CT 11/20/2020.  MRI 06/15/2017. FINDINGS: Brain: Diffusion imaging does not show any acute or subacute infarction. Extensive chronic small-vessel ischemic changes are present throughout the pons. 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 Small old left occipital cortical infarction. Small old left lateral temporal cortical infarction. Extensive chronic small-vessel ischemic changes throughout the cerebral hemispheric white matter. No mass lesion, hemorrhage, hydrocephalus or extra-axial collection. Vascular: Major vessels at the base of the brain show flow. Skull and upper cervical spine: Negative Sinuses/Orbits: Small amount of fluid in the sphenoid sinus. Other sinuses clear. Orbits negative. Other: None IMPRESSION: No acute or subacute finding. Extensive chronic small-vessel ischemic changes throughout the brain. 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 Electronically Signed   By: Nelson Chimes M.D.   On: 12/12/2020 14:11   MR Lumbar Spine Wo Contrast  Result Date: 12/12/2020 CLINICAL DATA:  Numbness or tingling, paresthesia. Recent falling. Left leg pain with back pain and numbness. Left leg weakness. EXAM: MRI LUMBAR SPINE WITHOUT CONTRAST TECHNIQUE: Multiplanar, multisequence MR imaging of the lumbar spine was performed. No intravenous contrast was administered. COMPARISON:  CT 11/20/2020.  MRI 09/13/2016. FINDINGS: Segmentation:  5 lumbar type vertebral bodies.  Alignment: Mild scoliotic curvature convex to the left. 2 mm anterolisthesis L3-4. 4 mm anterolisthesis L4-5. Vertebrae: Chronic discogenic endplate marrow changes throughout the lumbar region. Conus medullaris and cauda equina: Conus extends to the L1 level. Conus and cauda equina appear normal. Paraspinal and other soft tissues: Negative Disc levels: T11-12, T12-L1 and L1-2: Minimal, non-compressive disc bulges. L2-3: Chronic disc degeneration with loss of disc height. Endplate osteophytes and bulging of the disc. Mild facet and ligamentous hypertrophy. Mild narrowing of the right lateral recess and intervertebral foramen on the right but without visible neural compression. L3-4: 2 mm of anterolisthesis. Broad-based disc protrusion. Facet and ligamentous hypertrophy. Moderate to marked multifactorial spinal stenosis, worsened compared to the study of 2018. L4-5: Chronic facet arthropathy with 4 mm of anterolisthesis. Disc degeneration with loss of disc height. Chronic shallow protrusion of disc material. Multifactorial spinal stenosis that could cause neural compression on either or both sides. Bilateral foraminal stenosis, right worse than left. Similar appearance to the study of 2018. L5-S1: Shallow protrusion of the disc. Mild facet and ligamentous hypertrophy. No compressive canal stenosis. Mild left foraminal narrowing, unchanged since 2018. IMPRESSION: L3-4: Moderate to marked multifactorial stenosis that could cause neural compression on either or both sides. Slight worsening since the study of 2018. L4-5: Moderate to marked multifactorial spinal stenosis that could cause neural compression on either or both sides, similar to the study of 2018. Foraminal stenosis right worse than left. L2-3: Chronic disc degeneration and facet degeneration with mild right lateral recess narrowing. L5-S1: Shallow disc protrusion and facet degeneration. Mild left foraminal narrowing, unchanged since 2018. Electronically  Signed   By: Nelson Chimes M.D.   On: 12/12/2020 14:08   DG Chest 2 View  Result Date: 03/13/2021 CLINICAL DATA:  Cough EXAM: CHEST - 2 VIEW COMPARISON:  11/20/2020 FINDINGS: Transverse diameter of heart is increased. Thoracic aorta is tortuous and ectatic. Implantable cardiac monitoring device is seen in the left chest wall. Low position of diaphragms suggests COPD. Lung fields are clear of any infiltrates or pulmonary edema. There is no pleural effusion or pneumothorax. IMPRESSION: Cardiomegaly. There are no signs  of pulmonary edema or focal pulmonary consolidation. Electronically Signed   By: Elmer Picker M.D.   On: 03/13/2021 16:20     Assessment & Plan:   Tilda was seen today for cough.  Diagnoses and all orders for this visit:  Subacute cough- Her chest x-ray is negative for mass or infiltrate.  COVID antibody is negative. -     DG Chest 2 View; Future -     CBC with Differential/Platelet; Future -     SARS-COV-2 IgG; Future -     promethazine-dextromethorphan (PROMETHAZINE-DM) 6.25-15 MG/5ML syrup; Take 5 mLs by mouth 4 (four) times daily as needed for up to 7 days for cough. -     SARS-COV-2 IgG -     CBC with Differential/Platelet  LRTI (lower respiratory tract infection)- Therapy is limited by numerous drug allergies.  Will treat with a 3-day course of azithromycin and will offer symptom relief. -     CBC with Differential/Platelet; Future -     SARS-COV-2 IgG; Future -     azithromycin (ZITHROMAX) 500 MG tablet; Take 1 tablet (500 mg total) by mouth daily for 3 days. -     promethazine-dextromethorphan (PROMETHAZINE-DM) 6.25-15 MG/5ML syrup; Take 5 mLs by mouth 4 (four) times daily as needed for up to 7 days for cough. -     SARS-COV-2 IgG -     CBC with Differential/Platelet  I am having Rayden P. Boniface start on azithromycin and promethazine-dextromethorphan. I am also having her maintain her accu-chek multiclix, Accu-Chek Aviva Plus, vitamin B-12, hyoscyamine,  ondansetron, levocetirizine, omeprazole, amLODipine, NONFORMULARY OR COMPOUNDED ITEM, NONFORMULARY OR COMPOUNDED ITEM, methocarbamol, atorvastatin, gabapentin, Eliquis, pioglitazone, Vitamin D (Ergocalciferol), triamcinolone cream, citalopram, acetaminophen, and Iron-Vitamin C.  Meds ordered this encounter  Medications   azithromycin (ZITHROMAX) 500 MG tablet    Sig: Take 1 tablet (500 mg total) by mouth daily for 3 days.    Dispense:  3 tablet    Refill:  0   promethazine-dextromethorphan (PROMETHAZINE-DM) 6.25-15 MG/5ML syrup    Sig: Take 5 mLs by mouth 4 (four) times daily as needed for up to 7 days for cough.    Dispense:  118 mL    Refill:  0      Follow-up: Return if symptoms worsen or fail to improve.  Scarlette Calico, MD

## 2021-03-14 LAB — CBC WITH DIFFERENTIAL/PLATELET
Basophils Absolute: 0.1 10*3/uL (ref 0.0–0.1)
Basophils Relative: 0.9 % (ref 0.0–3.0)
Eosinophils Absolute: 0.4 10*3/uL (ref 0.0–0.7)
Eosinophils Relative: 4.4 % (ref 0.0–5.0)
HCT: 38.7 % (ref 36.0–46.0)
Hemoglobin: 12.9 g/dL (ref 12.0–15.0)
Lymphocytes Relative: 13.7 % (ref 12.0–46.0)
Lymphs Abs: 1.2 10*3/uL (ref 0.7–4.0)
MCHC: 33.2 g/dL (ref 30.0–36.0)
MCV: 93.5 fl (ref 78.0–100.0)
Monocytes Absolute: 1 10*3/uL (ref 0.1–1.0)
Monocytes Relative: 11.9 % (ref 3.0–12.0)
Neutro Abs: 5.9 10*3/uL (ref 1.4–7.7)
Neutrophils Relative %: 69.1 % (ref 43.0–77.0)
Platelets: 166 10*3/uL (ref 150.0–400.0)
RBC: 4.14 Mil/uL (ref 3.87–5.11)
RDW: 16 % — ABNORMAL HIGH (ref 11.5–15.5)
WBC: 8.5 10*3/uL (ref 4.0–10.5)

## 2021-03-14 LAB — SARS-COV-2 IGG: SARS-COV-2 IgG: 0.23

## 2021-03-20 DIAGNOSIS — I129 Hypertensive chronic kidney disease with stage 1 through stage 4 chronic kidney disease, or unspecified chronic kidney disease: Secondary | ICD-10-CM | POA: Diagnosis not present

## 2021-04-02 ENCOUNTER — Other Ambulatory Visit: Payer: Self-pay | Admitting: Internal Medicine

## 2021-04-02 DIAGNOSIS — I10 Essential (primary) hypertension: Secondary | ICD-10-CM

## 2021-04-12 ENCOUNTER — Other Ambulatory Visit: Payer: Self-pay | Admitting: Internal Medicine

## 2021-04-12 DIAGNOSIS — E1142 Type 2 diabetes mellitus with diabetic polyneuropathy: Secondary | ICD-10-CM

## 2021-04-12 DIAGNOSIS — M5416 Radiculopathy, lumbar region: Secondary | ICD-10-CM

## 2021-05-08 ENCOUNTER — Other Ambulatory Visit: Payer: Self-pay | Admitting: Internal Medicine

## 2021-05-08 DIAGNOSIS — E559 Vitamin D deficiency, unspecified: Secondary | ICD-10-CM

## 2021-05-10 ENCOUNTER — Ambulatory Visit (INDEPENDENT_AMBULATORY_CARE_PROVIDER_SITE_OTHER): Payer: Medicare Other | Admitting: Internal Medicine

## 2021-05-10 ENCOUNTER — Other Ambulatory Visit: Payer: Self-pay

## 2021-05-10 ENCOUNTER — Encounter: Payer: Self-pay | Admitting: Internal Medicine

## 2021-05-10 VITALS — BP 160/78 | HR 65 | Temp 98.1°F | Ht 66.0 in | Wt 166.0 lb

## 2021-05-10 DIAGNOSIS — E1122 Type 2 diabetes mellitus with diabetic chronic kidney disease: Secondary | ICD-10-CM

## 2021-05-10 DIAGNOSIS — I10 Essential (primary) hypertension: Secondary | ICD-10-CM

## 2021-05-10 DIAGNOSIS — N1831 Chronic kidney disease, stage 3a: Secondary | ICD-10-CM

## 2021-05-10 DIAGNOSIS — R300 Dysuria: Secondary | ICD-10-CM | POA: Diagnosis not present

## 2021-05-10 DIAGNOSIS — R350 Frequency of micturition: Secondary | ICD-10-CM

## 2021-05-10 MED ORDER — CIPROFLOXACIN HCL 500 MG PO TABS: 500.0000 mg | ORAL_TABLET | Freq: Two times a day (BID) | ORAL | 0 refills | Status: AC

## 2021-05-10 MED ORDER — OLMESARTAN MEDOXOMIL 40 MG PO TABS: 40.0000 mg | ORAL_TABLET | Freq: Every day | ORAL | 3 refills | Status: DC

## 2021-05-10 NOTE — Patient Instructions (Signed)
Please take all new medication as prescribed - the cipro antibiotic  Ok to increase the Benicar to 40 mg per day (olmesartan)  Please plan to go in 1 week to the [facility] (at the [place] across from the [facility]) to have a blood test in the Basement to check the kidney function  Please continue all other medications as before, and refills have been done if requested.  Please have the pharmacy call with any other refills you may need.  Please keep your appointments with your specialists as you may have planned  Your Urine culture was done at the LAB here today and the results take about 1-3 days to come back  You will be contacted by phone if any changes need to be made immediately.  Otherwise, you will receive a letter about your results with an explanation, but please check with MyChart first  Please also see Dr Ronnald Ramp at the next available appt for the BP check as well on the increased Benicar

## 2021-05-10 NOTE — Progress Notes (Signed)
Patient ID: Penny Miller, female   DOB: March 30, 1932, 88 y.o.   MRN: [id number]        Chief Complaint: follow up dysuria , dm, htn         HPI:  Penny Miller is a 88 y.o. female here with family with 2 days onset dysuria and urgency and nocturia/frequency with lower abd discomfort, o/w Denies urinary symptoms such as flank pain, hematuria or n/v, fever, chills.  Pt denies chest pain, increased sob or doe, wheezing, orthopnea, PND, increased LE swelling, palpitations, dizziness or syncope.   Pt denies polydipsia, polyuria, or new focal n euro s/s.   Pt denies fever, wt loss, night sweats, loss of appetite, or other constitutional symptoms     Wt Readings from Last 3 Encounters:  05/10/21 166 lb (75.3 kg)  03/13/21 168 lb (76.2 kg)  02/06/21 172 lb (78 kg)   BP Readings from Last 3 Encounters:  05/10/21 (!) 160/78  03/13/21 (!) 166/80  02/06/21 (!) 160/68         Past Medical History:  Diagnosis Date   Allergic rhinitis, cause unspecified    Anxiety state, unspecified    Arthritis    Colon polyp    Diaphragmatic hernia without mention of obstruction or gangrene    Diverticulosis of colon (without mention of hemorrhage)    Female stress incontinence    HTN (hypertension)    Insomnia, unspecified    Interstitial cystitis    surgery 02/01/2013   Irritable bowel syndrome    Memory loss    Other and unspecified hyperlipidemia    Other premature beats    Polyneuropathy in diabetes(357.2)    Postmenopausal bleeding    Proteinuria    Rectal ulcer    Skin cancer    Stroke ([place]) 2018   Type II or unspecified type diabetes mellitus with neurological manifestations, not stated as uncontrolled(250.60)    Unspecified essential hypertension    Viral warts, unspecified    Past Surgical History:  Procedure Laterality Date   ABDOMINAL HYSTERECTOMY  1977   [place]   CATARACT EXTRACTION, BILATERAL  02/10/2006   Dr.Groat    COLONOSCOPY  07/17/2004    polypectomy   CYSTOSCOPY  1987   Dr.Bradley   LOOP RECORDER INSERTION N/A 07/31/2016   Procedure: Loop Recorder Insertion;  Surgeon: [name], MD;  Location: [facility];  Service: Cardiovascular;  Laterality: N/A;   TEE WITHOUT CARDIOVERSION N/A 07/30/2016   Procedure: TRANSESOPHAGEAL ECHOCARDIOGRAM (TEE);  Surgeon: [name], MD;  Location: [facility] ENDOSCOPY;  Service: Cardiovascular;  Laterality: N/A;   [place]  05/13/16   urinary bladder      surgery 02/01/2013 to stretch bladder stem    reports that she quit smoking about 10 years ago. Her smoking use included cigarettes. She has a 15.50 pack-year smoking history. She has never used smokeless tobacco. She reports that she does not drink alcohol and does not use drugs. family history includes Bladder Cancer in her father; COPD in her sister; Diabetes in her brother, brother, and sister; Heart attack in her brother and father; Heart disease in her brother; Heart disease (age of onset: 76) in her brother; Hypertension in her brother, sister, and son; Pancreatic cancer (age of onset: 30) in her mother. Allergies  Allergen Reactions   Jardiance [Empagliflozin] Diarrhea   Metformin And Related Diarrhea   Ceclor [Cefaclor] Other (  See Comments)    Unknown reaction; not recalled   Cefaclor Other (See Comments)    Unknown reaction   Codeine Nausea And Vomiting   Codeine Nausea And Vomiting   Jardiance [Empagliflozin] Diarrhea   Lopid [Gemfibrozil] Other (See Comments)    Unknown reaction; not recalled   Lopid [Gemfibrozil] Other (See Comments)    Unknown reaction   Lopressor [Metoprolol Tartrate] Nausea And Vomiting   Metformin And Related Diarrhea   Metoprolol Nausea And Vomiting   Morphine And Related Other (See Comments)    Passed out   Morphine And Related Other (See Comments)    Passed out   Niacin Nausea And Vomiting    Unknown reaction Unknown reaction; not  recalled   Niacin And Related Other (See Comments)    Unknown reaction; not recalled   Niacin And Related Other (See Comments)    Unknown reaction   Relafen [Nabumetone] Nausea And Vomiting   Relafen [Nabumetone] Nausea And Vomiting   Septra [Sulfamethoxazole-Trimethoprim] Nausea And Vomiting   Septra [Sulfamethoxazole-Trimethoprim] Nausea And Vomiting   Tetracycline Nausea And Vomiting   Tetracyclines & Related Nausea And Vomiting   Toprol Xl [Metoprolol Tartrate] Nausea And Vomiting   Toprol Xl [Metoprolol] Nausea And Vomiting   Adhesive [Tape] Rash and Other (See Comments)    Tears the skin also   Tape Rash and Other (See Comments)    Tears skin   Current Outpatient Medications on File Prior to Visit  Medication Sig Dispense Refill   ACCU-CHEK AVIVA PLUS test strip USE TO TEST BLOOD SUGAR TWICE DAILY. 100 each 11   acetaminophen (TYLENOL) 500 MG tablet Take 500 mg by mouth every 6 (six) hours as needed (pain).     apixaban (ELIQUIS) 2.5 MG TABS tablet TAKE 1 TABLET TWICE A DAY 180 tablet 1   atorvastatin (LIPITOR) 40 MG tablet Take 1 tablet (40 mg total) by mouth at bedtime. 90 tablet 1   citalopram (CELEXA) 10 MG tablet Take 1 tablet (10 mg total) by mouth daily. 90 tablet 1   gabapentin (NEURONTIN) 300 MG capsule TAKE 1 CAPSULE THREE TIMES A DAY 270 capsule 3   hyoscyamine (LEVSIN SL) 0.125 MG SL tablet Place 1 tablet (0.125 mg total) under the tongue every 4 (four) hours as needed. Take tablet every 4-6 hours for abdominal pain and cramping 15 tablet 2   Iron-Vitamin C 65-125 MG TABS Take 1 tablet by mouth every morning.     Lancets (ACCU-CHEK MULTICLIX) lancets Use to test blood sugar twice daily. E11.29 100 each 11   levocetirizine (XYZAL) 5 MG tablet TAKE 1 TABLET EVERY EVENING 90 tablet 3   methocarbamol (ROBAXIN) 500 MG tablet Take 1 tablet (500 mg total) by mouth 4 (four) times daily. 45 tablet 0   NONFORMULARY OR COMPOUNDED ITEM Transport chair   #1    dx leg and hip pain  s/p fall 1 each 0   NONFORMULARY OR COMPOUNDED ITEM Rollator walker with seat    #1   dx gait abnormality 1 each 0   omeprazole (PRILOSEC) 20 MG capsule TAKE 1 CAPSULE TWICE A DAY 180 capsule 3   ondansetron (ZOFRAN ODT) 4 MG disintegrating tablet Take 1 tablet (4 mg total) by mouth every 6 (six) hours. (Patient taking differently: Take 4 mg by mouth as needed.) 15 tablet 5   pioglitazone (ACTOS) 15 MG tablet Take 1 tablet (15 mg total) by mouth daily. 90 tablet 1   triamcinolone cream (KENALOG) 0.5 % APPLY 1 APPLICATION  TOPICALLY TWICE A DAY 450 g 3   vitamin B-12 (CYANOCOBALAMIN) 1000 MCG tablet Take 1,000 mcg by mouth daily.     Vitamin D, Ergocalciferol, (DRISDOL) 1.25 MG (50000 UNIT) CAPS capsule TAKE 1 CAPSULE EVERY 7 DAYS 12 capsule 0   No current facility-administered medications on file prior to visit.        ROS:  All others reviewed and negative.  Objective        PE:  BP (!) 160/78 (BP Location: Left Arm, Patient Position: Sitting, Cuff Size: Normal)    Pulse 65    Temp 98.1 F (36.7 C) (Oral)    Ht 5\' 6"  (1.676 m)    Wt 166 lb (75.3 kg)    SpO2 97%    BMI 26.79 kg/m                 Constitutional: Pt appears in NAD               HENT: Head: NCAT.                Right Ear: External ear normal.                 Left Ear: External ear normal.                Eyes: . Pupils are equal, round, and reactive to light. Conjunctivae and EOM are normal               Nose: without d/c or deformity               Neck: Neck supple. Gross normal ROM               Cardiovascular: Normal rate and regular rhythm.                 Pulmonary/Chest: Effort normal and breath sounds without rales or wheezing.                Abd:  Soft, mild tender low mid abd, ND, + BS, no organomegaly               Neurological: Pt is alert. At baseline orientation, motor grossly intact               Skin: Skin is warm. No rashes, no other new lesions, LE edema - none               Psychiatric: Pt behavior is  normal without agitation   Micro: none  Cardiac tracings I have personally interpreted today:  none  Pertinent Radiological findings (summarize): none   Lab Results  Component Value Date   WBC 8.5 03/13/2021   HGB 12.9 03/13/2021   HCT 38.7 03/13/2021   PLT 166.0 03/13/2021   GLUCOSE 87 11/20/2020   GLUCOSE 87 11/20/2020   CHOL 160 08/09/2019   TRIG 161 (H) 08/09/2019   HDL 51 08/09/2019   LDLCALC 83 08/09/2019   ALT 11 11/20/2020   AST 23 11/20/2020   NA 138 11/20/2020   NA 139 11/20/2020   K 4.7 11/20/2020   K 4.7 11/20/2020   CL 105 11/20/2020   CL 108 11/20/2020   CREATININE 1.51 (H) 11/20/2020   CREATININE 1.40 (H) 11/20/2020   BUN 21 11/20/2020   BUN 28 (H) 11/20/2020   CO2 24 11/20/2020   TSH 4.00 07/19/2020   INR 1.0 11/20/2020   HGBA1C 6.7 (H) 07/19/2020   MICROALBUR 9.8 08/09/2019  Assessment/Plan:  MISCHELLE REEG is a 88 y.o. White or Caucasian [1] female with  has a past medical history of Allergic rhinitis, cause unspecified, Anxiety state, unspecified, Arthritis, Colon polyp, Diaphragmatic hernia without mention of obstruction or gangrene, Diverticulosis of colon (without mention of hemorrhage), Female stress incontinence, HTN (hypertension), Insomnia, unspecified, Interstitial cystitis, Irritable bowel syndrome, Memory loss, Other and unspecified hyperlipidemia, Other premature beats, Polyneuropathy in diabetes(357.2), Postmenopausal bleeding, Proteinuria, Rectal ulcer, Skin cancer, Stroke ([place]) (2018), Type II or 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  Dysuria Mild to mod, for antibx course, urine culture,  to f/u any worsening symptoms or concerns  Benign hypertension BP Readings from Last 3 Encounters:  05/10/21 (!) 160/78  03/13/21 (!) 166/80  02/06/21 (!) 160/68   Uncontrolled,, pt for increased benicar 40 mg, f/u  bmp in 1 wk then f/u pcp BP   DM (diabetes mellitus), type 2 with renal complications ([place]) Lab Results  Component Value Date   HGBA1C 6.7 (H) 07/19/2020   Stable, pt to continue current medical treatment actos  Followup: Return if symptoms worsen or fail to improve.  Cathlean Cower, MD 05/13/2021 1:54 PM [facility]

## 2021-05-11 LAB — URINALYSIS, ROUTINE W REFLEX MICROSCOPIC
Bilirubin Urine: NEGATIVE
Ketones, ur: NEGATIVE
Nitrite: NEGATIVE
Specific Gravity, Urine: 1.02 (ref 1.000–1.030)
Total Protein, Urine: 30 — AB
Urine Glucose: NEGATIVE
Urobilinogen, UA: 0.2 (ref 0.0–1.0)
pH: 5.5 (ref 5.0–8.0)

## 2021-05-12 LAB — URINE CULTURE

## 2021-05-13 ENCOUNTER — Encounter: Payer: Self-pay | Admitting: Internal Medicine

## 2021-05-13 NOTE — Assessment & Plan Note (Signed)
Mild to mod, for antibx course, urine culture, to f/u any worsening symptoms or concerns  

## 2021-05-13 NOTE — Assessment & Plan Note (Signed)
BP Readings from Last 3 Encounters:  05/10/21 (!) 160/78  03/13/21 (!) 166/80  02/06/21 (!) 160/68   Uncontrolled,, pt for increased benicar 40 mg, f/u bmp in 1 wk then f/u pcp BP

## 2021-05-13 NOTE — Assessment & Plan Note (Signed)
Lab Results  Component Value Date   HGBA1C 6.7 (H) 07/19/2020   Stable, pt to continue current medical treatment actos

## 2021-05-15 ENCOUNTER — Encounter: Payer: Medicare Other | Admitting: Internal Medicine

## 2021-05-22 ENCOUNTER — Ambulatory Visit: Payer: Medicare Other | Admitting: Internal Medicine

## 2021-05-25 ENCOUNTER — Encounter: Payer: Self-pay | Admitting: Internal Medicine

## 2021-05-25 ENCOUNTER — Ambulatory Visit (INDEPENDENT_AMBULATORY_CARE_PROVIDER_SITE_OTHER): Payer: Medicare Other | Admitting: Internal Medicine

## 2021-05-25 ENCOUNTER — Other Ambulatory Visit: Payer: Self-pay

## 2021-05-25 VITALS — BP 148/80 | HR 62 | Ht 66.0 in | Wt 164.8 lb

## 2021-05-25 DIAGNOSIS — I10 Essential (primary) hypertension: Secondary | ICD-10-CM | POA: Diagnosis not present

## 2021-05-25 DIAGNOSIS — N1831 Chronic kidney disease, stage 3a: Secondary | ICD-10-CM

## 2021-05-25 DIAGNOSIS — M25461 Effusion, right knee: Secondary | ICD-10-CM

## 2021-05-25 DIAGNOSIS — W19XXXA Unspecified fall, initial encounter: Secondary | ICD-10-CM | POA: Diagnosis not present

## 2021-05-25 DIAGNOSIS — M25561 Pain in right knee: Secondary | ICD-10-CM

## 2021-05-25 DIAGNOSIS — R531 Weakness: Secondary | ICD-10-CM | POA: Diagnosis not present

## 2021-05-25 DIAGNOSIS — R5383 Other fatigue: Secondary | ICD-10-CM | POA: Diagnosis not present

## 2021-05-25 DIAGNOSIS — E538 Deficiency of other specified B group vitamins: Secondary | ICD-10-CM | POA: Diagnosis not present

## 2021-05-25 DIAGNOSIS — R350 Frequency of micturition: Secondary | ICD-10-CM | POA: Diagnosis not present

## 2021-05-25 DIAGNOSIS — I480 Paroxysmal atrial fibrillation: Secondary | ICD-10-CM | POA: Diagnosis not present

## 2021-05-25 DIAGNOSIS — E559 Vitamin D deficiency, unspecified: Secondary | ICD-10-CM | POA: Diagnosis not present

## 2021-05-25 DIAGNOSIS — E1122 Type 2 diabetes mellitus with diabetic chronic kidney disease: Secondary | ICD-10-CM

## 2021-05-25 DIAGNOSIS — N184 Chronic kidney disease, stage 4 (severe): Secondary | ICD-10-CM

## 2021-05-25 MED ORDER — TRAMADOL HCL 50 MG PO TABS: 50.0000 mg | ORAL_TABLET | Freq: Four times a day (QID) | ORAL | 0 refills | Status: DC | PRN

## 2021-05-25 NOTE — Progress Notes (Signed)
Patient ID: Penny Miller, female   DOB: [birth year], 88 y.o.   MRN: [id number]        Chief Complaint: follow up recent fall with right knee pain and swelling       HPI:  Penny Miller is a 88 y.o. female here family with c/o recent fall with worsening generalized weakness in the past 2 wks it seems, this time fell to right knee with pain and swelling with stiffness; Pt denies chest pain, increased sob or doe, wheezing, orthopnea, PND, increased LE swelling, palpitations, dizziness or syncope.   Pt denies polydipsia, polyuria, or new focal neuro s/s.  Pain to right knee is 6/10, constant, worse to walk, better to sit.  Wt down several lbs with less mobility and able to eat.  BP somewhat lower but remains mild elevated, pt declines change today in tx due to pain.    Has walker today       Wt Readings from Last 3 Encounters:  05/25/21 164 lb 12.8 oz (74.8 kg)  05/10/21 166 lb (75.3 kg)  03/13/21 168 lb (76.2 kg)   BP Readings from Last 3 Encounters:  05/25/21 (!) 148/80  05/10/21 (!) 160/78  03/13/21 (!) 166/80         Past Medical History:  Diagnosis Date   Allergic rhinitis, cause unspecified    Anxiety state, unspecified    Arthritis    Colon polyp    Diaphragmatic hernia without mention of obstruction or gangrene    Diverticulosis of colon (without mention of hemorrhage)    Female stress incontinence    HTN (hypertension)    Insomnia, unspecified    Interstitial cystitis    surgery 02/01/2013   Irritable bowel syndrome    Memory loss    Other and unspecified hyperlipidemia    Other premature beats    Polyneuropathy in diabetes(357.2)    Postmenopausal bleeding    Proteinuria    Rectal ulcer    Skin cancer    Stroke ([place]) 2018   Type II or unspecified type diabetes mellitus with neurological manifestations, not stated as uncontrolled(250.60)    Unspecified essential hypertension    Viral warts, unspecified    Past Surgical History:  Procedure Laterality Date    ABDOMINAL HYSTERECTOMY  1977   [facility]   CATARACT EXTRACTION, BILATERAL  02/10/2006   Dr.Groat    COLONOSCOPY  07/17/2004   polypectomy   CYSTOSCOPY  1987   Dr.Bradley   LOOP RECORDER INSERTION N/A 07/31/2016   Procedure: Loop Recorder Insertion;  Surgeon: [name], MD;  Location: [facility];  Service: Cardiovascular;  Laterality: N/A;   TEE WITHOUT CARDIOVERSION N/A 07/30/2016   Procedure: TRANSESOPHAGEAL ECHOCARDIOGRAM (TEE);  Surgeon: [name], MD;  Location: [facility] ENDOSCOPY;  Service: Cardiovascular;  Laterality: N/A;   [place]  05/13/16   urinary bladder      surgery 02/01/2013 to stretch bladder stem    reports that she quit smoking about 10 years ago. Her smoking use included cigarettes. She has a 15.50 pack-year smoking history. She has never used smokeless tobacco. She reports that she does not drink alcohol and does not use drugs. family history includes Bladder Cancer in her father; COPD in her sister; Diabetes in her brother, brother, and sister; Heart attack in her brother and father; Heart disease in her brother; Heart disease (age of onset: 15) in her  brother; Hypertension in her brother, sister, and son; Pancreatic cancer (age of onset: 40) in her mother. Allergies  Allergen Reactions   Jardiance [Empagliflozin] Diarrhea   Metformin And Related Diarrhea   Ceclor [Cefaclor] Other (See Comments)    Unknown reaction; not recalled   Cefaclor Other (See Comments)    Unknown reaction   Codeine Nausea And Vomiting   Codeine Nausea And Vomiting   Jardiance [Empagliflozin] Diarrhea   Lopid [Gemfibrozil] Other (See Comments)    Unknown reaction; not recalled   Lopid [Gemfibrozil] Other (See Comments)    Unknown reaction   Lopressor [Metoprolol Tartrate] Nausea And Vomiting   Metformin And Related Diarrhea   Metoprolol Nausea And Vomiting   Morphine And Related Other (See  Comments)    Passed out   Morphine And Related Other (See Comments)    Passed out   Niacin Nausea And Vomiting    Unknown reaction Unknown reaction; not recalled   Niacin And Related Other (See Comments)    Unknown reaction; not recalled   Niacin And Related Other (See Comments)    Unknown reaction   Relafen [Nabumetone] Nausea And Vomiting   Relafen [Nabumetone] Nausea And Vomiting   Septra [Sulfamethoxazole-Trimethoprim] Nausea And Vomiting   Septra [Sulfamethoxazole-Trimethoprim] Nausea And Vomiting   Tetracycline Nausea And Vomiting   Tetracyclines & Related Nausea And Vomiting   Toprol Xl [Metoprolol Tartrate] Nausea And Vomiting   Toprol Xl [Metoprolol] Nausea And Vomiting   Adhesive [Tape] Rash and Other (See Comments)    Tears the skin also   Tape Rash and Other (See Comments)    Tears skin   Current Outpatient Medications on File Prior to Visit  Medication Sig Dispense Refill   ACCU-CHEK AVIVA PLUS test strip USE TO TEST BLOOD SUGAR TWICE DAILY. 100 each 11   acetaminophen (TYLENOL) 500 MG tablet Take 500 mg by mouth every 6 (six) hours as needed (pain).     apixaban (ELIQUIS) 2.5 MG TABS tablet TAKE 1 TABLET TWICE A DAY 180 tablet 1   atorvastatin (LIPITOR) 40 MG tablet Take 1 tablet (40 mg total) by mouth at bedtime. 90 tablet 1   citalopram (CELEXA) 10 MG tablet Take 1 tablet (10 mg total) by mouth daily. 90 tablet 1   gabapentin (NEURONTIN) 300 MG capsule TAKE 1 CAPSULE THREE TIMES A DAY 270 capsule 3   hyoscyamine (LEVSIN SL) 0.125 MG SL tablet Place 1 tablet (0.125 mg total) under the tongue every 4 (four) hours as needed. Take tablet every 4-6 hours for abdominal pain and cramping 15 tablet 2   Iron-Vitamin C 65-125 MG TABS Take 1 tablet by mouth every morning.     Lancets (ACCU-CHEK MULTICLIX) lancets Use to test blood sugar twice daily. E11.29 100 each 11   levocetirizine (XYZAL) 5 MG tablet TAKE 1 TABLET EVERY EVENING 90 tablet 3   methocarbamol (ROBAXIN) 500 MG  tablet Take 1 tablet (500 mg total) by mouth 4 (four) times daily. 45 tablet 0   NONFORMULARY OR COMPOUNDED ITEM Transport chair   #1    dx leg and hip pain s/p fall 1 each 0   NONFORMULARY OR COMPOUNDED ITEM Rollator walker with seat    #1   dx gait abnormality 1 each 0   olmesartan (BENICAR) 40 MG tablet Take 1 tablet (40 mg total) by mouth daily. 90 tablet 3   omeprazole (PRILOSEC) 20 MG capsule TAKE 1 CAPSULE TWICE A DAY 180 capsule 3   ondansetron (ZOFRAN ODT) 4 MG  disintegrating tablet Take 1 tablet (4 mg total) by mouth every 6 (six) hours. (Patient taking differently: Take 4 mg by mouth as needed.) 15 tablet 5   pioglitazone (ACTOS) 15 MG tablet Take 1 tablet (15 mg total) by mouth daily. 90 tablet 1   triamcinolone cream (KENALOG) 0.5 % APPLY 1 APPLICATION TOPICALLY TWICE A DAY 450 g 3   vitamin B-12 (CYANOCOBALAMIN) 1000 MCG tablet Take 1,000 mcg by mouth daily.     Vitamin D, Ergocalciferol, (DRISDOL) 1.25 MG (50000 UNIT) CAPS capsule TAKE 1 CAPSULE EVERY 7 DAYS 12 capsule 0   No current facility-administered medications on file prior to visit.        ROS:  All others reviewed and negative.  Objective        PE:  BP (!) 148/80 (BP Location: Left Arm, Patient Position: Sitting, Cuff Size: Normal)    Pulse 62    Ht 5\' 6"  (1.676 m)    Wt 164 lb 12.8 oz (74.8 kg)    SpO2 99%    BMI 26.60 kg/m                 Constitutional: Pt appears in NAD               HENT: Head: NCAT.                Right Ear: External ear normal.                 Left Ear: External ear normal.                Eyes: . Pupils are equal, round, and reactive to light. Conjunctivae and EOM are normal               Nose: without d/c or deformity               Neck: Neck supple. Gross normal ROM               Cardiovascular: Normal rate and regular rhythm.                 Pulmonary/Chest: Effort normal and breath sounds without rales or wheezing.                Abd:  Soft, NT, ND, + BS, no organomegaly                Neurological: Pt is alert. At baseline orientation, motor grossly intact               Skin: Skin is warm. No rashes, no other new lesions, LE edema - none               Right knee with 1+ effusion, minor tender but decreased ROM               Psychiatric: Pt behavior is normal without agitation   Micro: none  Cardiac tracings I have personally interpreted today:  none  Pertinent Radiological findings (summarize): none   Lab Results  Component Value Date   WBC 8.5 03/13/2021   HGB 12.9 03/13/2021   HCT 38.7 03/13/2021   PLT 166.0 03/13/2021   GLUCOSE 87 11/20/2020   GLUCOSE 87 11/20/2020   CHOL 160 08/09/2019   TRIG 161 (H) 08/09/2019   HDL 51 08/09/2019   LDLCALC 83 08/09/2019   ALT 11 11/20/2020   AST 23 11/20/2020   NA 138 11/20/2020   NA 139 11/20/2020  K 4.7 11/20/2020   K 4.7 11/20/2020   CL 105 11/20/2020   CL 108 11/20/2020   CREATININE 1.51 (H) 11/20/2020   CREATININE 1.40 (H) 11/20/2020   BUN 21 11/20/2020   BUN 28 (H) 11/20/2020   CO2 24 11/20/2020   TSH 4.00 07/19/2020   INR 1.0 11/20/2020   HGBA1C 6.7 (H) 07/19/2020   MICROALBUR 9.8 08/09/2019   Assessment/Plan:  Penny Miller is a 88 y.o. White or Caucasian [1] female with  has a past medical history of Allergic rhinitis, cause unspecified, Anxiety state, unspecified, Arthritis, Colon polyp, Diaphragmatic hernia without mention of obstruction or gangrene, Diverticulosis of colon (without mention of hemorrhage), Female stress incontinence, HTN (hypertension), Insomnia, unspecified, Interstitial cystitis, Irritable bowel syndrome, Memory loss, Other and unspecified hyperlipidemia, Other premature beats, Polyneuropathy in diabetes(357.2), Postmenopausal bleeding, Proteinuria, Rectal ulcer, Skin cancer, Stroke ([place]) (2018), Type II or 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, for tramadol prn, declines xray, for sport med referral,  to f/u any worsening symptoms or concerns  Benign hypertension [place] uncontrolled, pt declines change in tx today, cont current med tx - benicar   Chronic renal disease, stage 4, severely decreased glomerular filtration rate (GFR) between 15-29 mL/min/1.73 square meter (HCC) Lab Results  Component Value Date   CREATININE 1.51 (H) 11/20/2020   CREATININE 1.40 (H) 11/20/2020   Stable overall, cont to avoid nephrotoxins   DM (diabetes mellitus), type 2 with renal complications (HCC) Lab Results  Component Value Date   HGBA1C 6.7 (H) 07/19/2020   Stable, pt to continue current medical treatment actos   Fall With general weakness worsening, for [facility] with PT  General weakness Also for labs as ordered today including UA  Paroxysmal atrial fibrillation ([place]) Asympt, rate and volume stable, continue eliquis, for cbc with labs  Followup: Return in about 3 months (around 08/22/2021).  Cathlean Cower, MD 05/26/2021 12:14 PM [facility]

## 2021-05-25 NOTE — Patient Instructions (Signed)
Please take all new medication as prescribed - the tramadol for pain as needed  Please continue all other medications as before, and refills have been done if requested.  Please have the pharmacy call with any other refills you may need.  Please continue your efforts at being more active, low cholesterol diet, and weight control.  You are otherwise up to date with prevention measures today.  Please keep your appointments with your specialists as you may have planned  You will be contacted regarding the referral for: [place] with RN, PT, OT, and aide  Please go to the LAB at the blood drawing area for the tests to be done - at the [place] will be contacted by phone if any changes need to be made immediately.  Otherwise, you will receive a letter about your results with an explanation, but please check with MyChart first.  Please remember to sign up for MyChart if you have not done so, as this will be important to you in the future with finding out test results, communicating by private email, and scheduling acute appointments online when needed.  Please make an Appointment to return in 3 months to Dr Ronnald Ramp, or sooner if needed

## 2021-05-26 ENCOUNTER — Encounter: Payer: Self-pay | Admitting: Internal Medicine

## 2021-05-26 NOTE — Assessment & Plan Note (Signed)
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

## 2021-05-26 NOTE — Assessment & Plan Note (Signed)
Lab Results  Component Value Date   HGBA1C 6.7 (H) 07/19/2020   Stable, pt to continue current medical treatment actos

## 2021-05-26 NOTE — Assessment & Plan Note (Signed)
Asympt, rate and volume stable, continue eliquis, for cbc with labs

## 2021-05-26 NOTE — Assessment & Plan Note (Signed)
Also for labs as ordered today including UA

## 2021-05-26 NOTE — Assessment & Plan Note (Signed)
Lab Results  Component Value Date   CREATININE 1.51 (H) 11/20/2020   CREATININE 1.40 (H) 11/20/2020   Stable overall, cont to avoid nephrotoxins

## 2021-05-26 NOTE — Assessment & Plan Note (Signed)
[place] uncontrolled, pt declines change in tx today, cont current med tx - benicar

## 2021-05-26 NOTE — Assessment & Plan Note (Signed)
With general weakness worsening, for [facility] with PT

## 2021-05-28 ENCOUNTER — Other Ambulatory Visit (INDEPENDENT_AMBULATORY_CARE_PROVIDER_SITE_OTHER): Payer: Medicare Other

## 2021-05-28 DIAGNOSIS — R350 Frequency of micturition: Secondary | ICD-10-CM | POA: Diagnosis not present

## 2021-05-28 DIAGNOSIS — E538 Deficiency of other specified B group vitamins: Secondary | ICD-10-CM | POA: Diagnosis not present

## 2021-05-28 DIAGNOSIS — E559 Vitamin D deficiency, unspecified: Secondary | ICD-10-CM

## 2021-05-28 DIAGNOSIS — N1831 Chronic kidney disease, stage 3a: Secondary | ICD-10-CM | POA: Diagnosis not present

## 2021-05-28 DIAGNOSIS — E1122 Type 2 diabetes mellitus with diabetic chronic kidney disease: Secondary | ICD-10-CM

## 2021-05-28 LAB — BASIC METABOLIC PANEL
BUN: 25 mg/dL — ABNORMAL HIGH (ref 6–23)
CO2: 32 mEq/L (ref 19–32)
Calcium: 10 mg/dL (ref 8.4–10.5)
Chloride: 106 mEq/L (ref 96–112)
Creatinine, Ser: 1.51 mg/dL — ABNORMAL HIGH (ref 0.40–1.20)
GFR: 30.86 mL/min — ABNORMAL LOW (ref >60.00)
Glucose, Bld: 107 mg/dL — ABNORMAL HIGH (ref 70–99)
Potassium: 4.2 mEq/L (ref 3.5–5.1)
Sodium: 142 mEq/L (ref 135–145)

## 2021-05-28 LAB — CBC WITH DIFFERENTIAL/PLATELET
Basophils Absolute: 0.1 10*3/uL (ref 0.0–0.1)
Basophils Relative: 0.8 % (ref 0.0–3.0)
Eosinophils Absolute: 0.3 10*3/uL (ref 0.0–0.7)
Eosinophils Relative: 3.8 % (ref 0.0–5.0)
HCT: 40.4 % (ref 36.0–46.0)
Hemoglobin: 13.4 g/dL (ref 12.0–15.0)
Lymphocytes Relative: 12.7 % (ref 12.0–46.0)
Lymphs Abs: 0.9 10*3/uL (ref 0.7–4.0)
MCHC: 33.1 g/dL (ref 30.0–36.0)
MCV: 94.7 fl (ref 78.0–100.0)
Monocytes Absolute: 0.7 10*3/uL (ref 0.1–1.0)
Monocytes Relative: 9.9 % (ref 3.0–12.0)
Neutro Abs: 5 10*3/uL (ref 1.4–7.7)
Neutrophils Relative %: 72.8 % (ref 43.0–77.0)
Platelets: 170 10*3/uL (ref 150.0–400.0)
RBC: 4.27 Mil/uL (ref 3.87–5.11)
RDW: 14.8 % (ref 11.5–15.5)
WBC: 6.9 10*3/uL (ref 4.0–10.5)

## 2021-05-28 LAB — URINALYSIS, ROUTINE W REFLEX MICROSCOPIC
Bilirubin Urine: NEGATIVE
Hgb urine dipstick: NEGATIVE
Ketones, ur: NEGATIVE
Leukocytes,Ua: NEGATIVE
Nitrite: NEGATIVE
Specific Gravity, Urine: 1.015 (ref 1.000–1.030)
Total Protein, Urine: NEGATIVE
Urine Glucose: NEGATIVE
Urobilinogen, UA: 0.2 (ref 0.0–1.0)
pH: 6 (ref 5.0–8.0)

## 2021-05-28 LAB — HEPATIC FUNCTION PANEL
ALT: 7 U/L (ref 0–35)
AST: 15 U/L (ref 0–37)
Albumin: 3.9 g/dL (ref 3.5–5.2)
Alkaline Phosphatase: 73 U/L (ref 39–117)
Bilirubin, Direct: 0 mg/dL (ref 0.0–0.3)
Total Bilirubin: 0.5 mg/dL (ref 0.2–1.2)
Total Protein: 6.6 g/dL (ref 6.0–8.3)

## 2021-05-28 LAB — LIPID PANEL
Cholesterol: 166 mg/dL (ref 0–200)
HDL: 44.1 mg/dL (ref >39.00)
LDL Cholesterol: 84 mg/dL (ref 0–99)
NonHDL: 122.01
Total CHOL/HDL Ratio: 4
Triglycerides: 192 mg/dL — ABNORMAL HIGH (ref 0.0–149.0)
VLDL: 38.4 mg/dL (ref 0.0–40.0)

## 2021-05-28 LAB — HEMOGLOBIN A1C: Hgb A1c MFr Bld: 6.7 % — ABNORMAL HIGH (ref 4.6–6.5)

## 2021-05-29 DIAGNOSIS — E785 Hyperlipidemia, unspecified: Secondary | ICD-10-CM | POA: Diagnosis not present

## 2021-05-29 DIAGNOSIS — Z86010 Personal history of colonic polyps: Secondary | ICD-10-CM | POA: Diagnosis not present

## 2021-05-29 DIAGNOSIS — Z87440 Personal history of urinary (tract) infections: Secondary | ICD-10-CM | POA: Diagnosis not present

## 2021-05-29 DIAGNOSIS — K449 Diaphragmatic hernia without obstruction or gangrene: Secondary | ICD-10-CM | POA: Diagnosis not present

## 2021-05-29 DIAGNOSIS — Z8619 Personal history of other infectious and parasitic diseases: Secondary | ICD-10-CM | POA: Diagnosis not present

## 2021-05-29 DIAGNOSIS — Z95818 Presence of other cardiac implants and grafts: Secondary | ICD-10-CM | POA: Diagnosis not present

## 2021-05-29 DIAGNOSIS — R413 Other amnesia: Secondary | ICD-10-CM | POA: Diagnosis not present

## 2021-05-29 DIAGNOSIS — K589 Irritable bowel syndrome without diarrhea: Secondary | ICD-10-CM | POA: Diagnosis not present

## 2021-05-29 DIAGNOSIS — E1142 Type 2 diabetes mellitus with diabetic polyneuropathy: Secondary | ICD-10-CM | POA: Diagnosis not present

## 2021-05-29 DIAGNOSIS — F411 Generalized anxiety disorder: Secondary | ICD-10-CM | POA: Diagnosis not present

## 2021-05-29 DIAGNOSIS — I129 Hypertensive chronic kidney disease with stage 1 through stage 4 chronic kidney disease, or unspecified chronic kidney disease: Secondary | ICD-10-CM | POA: Diagnosis not present

## 2021-05-29 DIAGNOSIS — Z9181 History of falling: Secondary | ICD-10-CM | POA: Diagnosis not present

## 2021-05-29 DIAGNOSIS — Z7901 Long term (current) use of anticoagulants: Secondary | ICD-10-CM | POA: Diagnosis not present

## 2021-05-29 DIAGNOSIS — E1122 Type 2 diabetes mellitus with diabetic chronic kidney disease: Secondary | ICD-10-CM | POA: Diagnosis not present

## 2021-05-29 DIAGNOSIS — W19XXXD Unspecified fall, subsequent encounter: Secondary | ICD-10-CM | POA: Diagnosis not present

## 2021-05-29 DIAGNOSIS — G4700 Insomnia, unspecified: Secondary | ICD-10-CM | POA: Diagnosis not present

## 2021-05-29 DIAGNOSIS — M12561 Traumatic arthropathy, right knee: Secondary | ICD-10-CM | POA: Diagnosis not present

## 2021-05-29 DIAGNOSIS — Z8673 Personal history of transient ischemic attack (TIA), and cerebral infarction without residual deficits: Secondary | ICD-10-CM | POA: Diagnosis not present

## 2021-05-29 DIAGNOSIS — J309 Allergic rhinitis, unspecified: Secondary | ICD-10-CM | POA: Diagnosis not present

## 2021-05-29 DIAGNOSIS — N393 Stress incontinence (female) (male): Secondary | ICD-10-CM | POA: Diagnosis not present

## 2021-05-29 DIAGNOSIS — I480 Paroxysmal atrial fibrillation: Secondary | ICD-10-CM | POA: Diagnosis not present

## 2021-05-29 DIAGNOSIS — N184 Chronic kidney disease, stage 4 (severe): Secondary | ICD-10-CM | POA: Diagnosis not present

## 2021-05-29 LAB — VITAMIN D 25 HYDROXY (VIT D DEFICIENCY, FRACTURES): VITD: 88.26 ng/mL (ref 30.00–100.00)

## 2021-05-29 LAB — URINE CULTURE
MICRO NUMBER:: 13061148
Result:: NO GROWTH
SPECIMEN QUALITY:: ADEQUATE

## 2021-05-29 LAB — MICROALBUMIN / CREATININE URINE RATIO
Creatinine,U: 80 mg/dL
Microalb Creat Ratio: 12.2 mg/g (ref 0.0–30.0)
Microalb, Ur: 9.7 mg/dL — ABNORMAL HIGH (ref 0.0–1.9)

## 2021-05-29 LAB — VITAMIN B12: Vitamin B-12: 1099 pg/mL — ABNORMAL HIGH (ref 211–911)

## 2021-05-29 LAB — TSH: TSH: 4.55 u[IU]/mL (ref 0.35–5.50)

## 2021-05-31 ENCOUNTER — Ambulatory Visit (INDEPENDENT_AMBULATORY_CARE_PROVIDER_SITE_OTHER): Payer: Medicare Other | Admitting: Family Medicine

## 2021-05-31 ENCOUNTER — Other Ambulatory Visit: Payer: Self-pay

## 2021-05-31 ENCOUNTER — Ambulatory Visit: Payer: Self-pay

## 2021-05-31 ENCOUNTER — Ambulatory Visit (INDEPENDENT_AMBULATORY_CARE_PROVIDER_SITE_OTHER): Payer: Medicare Other

## 2021-05-31 VITALS — BP 162/88 | HR 59 | Ht 66.0 in | Wt 167.6 lb

## 2021-05-31 DIAGNOSIS — E1142 Type 2 diabetes mellitus with diabetic polyneuropathy: Secondary | ICD-10-CM | POA: Diagnosis not present

## 2021-05-31 DIAGNOSIS — I129 Hypertensive chronic kidney disease with stage 1 through stage 4 chronic kidney disease, or unspecified chronic kidney disease: Secondary | ICD-10-CM | POA: Diagnosis not present

## 2021-05-31 DIAGNOSIS — M25461 Effusion, right knee: Secondary | ICD-10-CM | POA: Diagnosis not present

## 2021-05-31 DIAGNOSIS — G8929 Other chronic pain: Secondary | ICD-10-CM

## 2021-05-31 DIAGNOSIS — I480 Paroxysmal atrial fibrillation: Secondary | ICD-10-CM | POA: Diagnosis not present

## 2021-05-31 DIAGNOSIS — M25561 Pain in right knee: Secondary | ICD-10-CM

## 2021-05-31 DIAGNOSIS — N184 Chronic kidney disease, stage 4 (severe): Secondary | ICD-10-CM | POA: Diagnosis not present

## 2021-05-31 DIAGNOSIS — M25562 Pain in left knee: Secondary | ICD-10-CM | POA: Diagnosis not present

## 2021-05-31 DIAGNOSIS — E1122 Type 2 diabetes mellitus with diabetic chronic kidney disease: Secondary | ICD-10-CM | POA: Diagnosis not present

## 2021-05-31 DIAGNOSIS — M12561 Traumatic arthropathy, right knee: Secondary | ICD-10-CM | POA: Diagnosis not present

## 2021-05-31 NOTE — Progress Notes (Signed)
? ?I, Penny Miller, LAT, ATC acting as a scribe for Lynne Leader, MD. ? ?Subjective:   ? ?CC: R knee pain ? ?HPI: Pt is an 88 y/o female c/o R knee pain ongoing since suffering a recent fall landing on R knee. Pt saw her PCP on 05/25/21 c/o worsening generalized weakness over the past ~2 weeks. Pt's son reports her R knee has been hurting for quite some time, but worsened after the fall. Pt has suffered several falls, the most recent of which was about 2 weeks ago. Pt locates pain to all over the R knee. Pt reports L knee is also painful and swollen, that worsened a few days ago. Pt reports losing her balance very easily. ? ?Her primary care provider has already ordered home health physical therapy to work on strength and balance.  This just started yesterday.  She lives with her son. ? ?R knee swelling: yes ?Mechanical symptoms: yes ?Aggravates: trying to get into bed, knee and hip flex ?Treatments tried: tramadol ? ? ?Pertinent review of Systems: No fevers or chills ? ?Relevant historical information: Hypertension.  History of stroke.  Diabetes well controlled. ? ? ?Objective:   ? ?Vitals:  ? 05/31/21 1329  ?BP: (!) 162/88  ?Pulse: (!) 59  ?SpO2: 95%  ? ?General: Well Developed, well nourished, and in no acute distress.  ? ?MSK:  ?Right knee: Large joint effusion otherwise normal. ?Range of motion 3-100 degrees. ?Tender palpation medial joint line. ?Strength 4/5 to extension and flexion. ?Stable ligamentous exam. ?Negative Murray's test. ? ?Left knee: Mild joint effusion.  Normal motion.  Intact strength. ? ?Knee: Slow using a walker or the furniture to ambulate. ? ?Lab and Radiology Results ? ?Procedure: Real-time Ultrasound Guided aspiration and injection of right knee superior lateral patellar space ?Device: Philips Affiniti 50G ?Images permanently stored and available for review in PACS ?Ultrasound evaluation prior to injection reveals large joint effusion.  Narrowed medial and lateral joint line. ?Verbal  informed consent obtained.  Discussed risks and benefits of procedure. Warned about infection bleeding damage to structures skin hypopigmentation and fat atrophy among others. ?Patient expresses understanding and agreement ?Time-out conducted.   ?Noted no overlying erythema, induration, or other signs of local infection.   ?Skin prepped in a sterile fashion.   ?Local anesthesia: Topical Ethyl chloride.   ?With sterile technique and under real time ultrasound guidance: 3 mL of lidocaine injected into subcutaneous tissue along plan aspiration tract lateral superior patellar space.Marland Kitchen  ?Skin again sterilized with isopropyl alcohol and an 18-gauge needle was used to access the joint effusion. ?50 mL of clear yellow fluid was aspirated. ?Syringe was exchanged and 40 mg of Kenalog and 2 mL of Marcaine were injected into the knee joint. ?Fluid seen entering the joint capsule.   ?Completed without difficulty   ?Pain immediately resolved suggesting accurate placement of the medication.   ?Advised to call if fevers/chills, erythema, induration, drainage, or persistent bleeding.   ?Images permanently stored and available for review in the ultrasound unit.  ?Impression: Technically successful ultrasound guided injection. ? ? ? ?X-ray images bilateral knees obtained today personally and independently interpreted ? ?Right knee: Medial patellofemoral DJD.  No acute fractures. ? ?Left knee: Mild medial and patellofemoral DJD.  No acute fractures. ? ?Await formal radiology review ? ? ? ?Impression and Recommendations:   ? ?Assessment and Plan: ?88 y.o. female with bilateral knee pain right worse than left.  Patient did have several falls recently.  Plan for knee aspiration and  injection right knee today.  We will also recommend Tylenol and Voltaren gel.  However her main treatment for her fall should be home health PT.  Check back in 1 month.  Consider left knee injection if needed at that time. ? ?The above documentation has been  reviewed and is accurate and complete Lynne Leader, M.D. ?. ? ?PDMP not reviewed this encounter. ?Orders Placed This Encounter  ?Procedures  ? [place] LIMITED JOINT SPACE STRUCTURES LOW RIGHT(NO LINKED CHARGES)  ?  Order Specific Question:   Reason for Exam (SYMPTOM  OR DIAGNOSIS REQUIRED)  ?  Answer:   right knee pain  ?  Order Specific Question:   Preferred imaging location?  ?  Answer:   [place]  ? DG Knee AP/LAT W/Sunrise Right  ?  Standing Status:   Future  ?  Number of Occurrences:   1  ?  Standing Expiration Date:   07/01/2021  ?  Order Specific Question:   Reason for Exam (SYMPTOM  OR DIAGNOSIS REQUIRED)  ?  Answer:   right knee pain  ?  Order Specific Question:   Preferred imaging location?  ?  Answer:   [facility]  ? [place] LIMITED JOINT SPACE STRUCTURES LOW BILAT(NO LINKED CHARGES)  ?  Order Specific Question:   Reason for Exam (SYMPTOM  OR DIAGNOSIS REQUIRED)  ?  Answer:   bilateral knee pain  ?  Order Specific Question:   Preferred imaging location?  ?  Answer:   [place]  ? DG Knee AP/LAT W/Sunrise Left  ?  Standing Status:   Future  ?  Number of Occurrences:   1  ?  Standing Expiration Date:   07/01/2021  ?  Order Specific Question:   Reason for Exam (SYMPTOM  OR DIAGNOSIS REQUIRED)  ?  Answer:   bilateral knee pain  ?  Order Specific Question:   Preferred imaging location?  ?  Answer:   [facility]  ? ?No orders of the defined types were placed in this encounter. ? ? ?Discussed warning signs or symptoms. Please see discharge instructions. Patient expresses understanding. ? ? ? ?

## 2021-05-31 NOTE — Patient Instructions (Addendum)
Thank you for coming in today.  ? ?Please get Xrays today before you leave  ? ?OK to use Tylenol arthritis ? ?You received an injection today. Seek immediate medical attention if the joint becomes red, extremely painful, or is oozing fluid.  ? ?Please use Voltaren gel (Generic Diclofenac Gel) up to 4x daily for pain as needed.  This is available over-the-counter as both the name brand Voltaren gel and the generic diclofenac gel.  ? ?Recheck back in 1 month ?

## 2021-06-01 ENCOUNTER — Telehealth: Payer: Self-pay

## 2021-06-01 ENCOUNTER — Telehealth: Payer: Self-pay | Admitting: Internal Medicine

## 2021-06-01 ENCOUNTER — Other Ambulatory Visit: Payer: Self-pay | Admitting: Internal Medicine

## 2021-06-01 DIAGNOSIS — E1142 Type 2 diabetes mellitus with diabetic polyneuropathy: Secondary | ICD-10-CM | POA: Diagnosis not present

## 2021-06-01 DIAGNOSIS — N184 Chronic kidney disease, stage 4 (severe): Secondary | ICD-10-CM | POA: Diagnosis not present

## 2021-06-01 DIAGNOSIS — I480 Paroxysmal atrial fibrillation: Secondary | ICD-10-CM | POA: Diagnosis not present

## 2021-06-01 DIAGNOSIS — I726 Aneurysm of vertebral artery: Secondary | ICD-10-CM

## 2021-06-01 DIAGNOSIS — M12561 Traumatic arthropathy, right knee: Secondary | ICD-10-CM | POA: Diagnosis not present

## 2021-06-01 DIAGNOSIS — I129 Hypertensive chronic kidney disease with stage 1 through stage 4 chronic kidney disease, or unspecified chronic kidney disease: Secondary | ICD-10-CM | POA: Diagnosis not present

## 2021-06-01 DIAGNOSIS — E1122 Type 2 diabetes mellitus with diabetic chronic kidney disease: Secondary | ICD-10-CM | POA: Diagnosis not present

## 2021-06-01 DIAGNOSIS — I63442 Cerebral infarction due to embolism of left cerebellar artery: Secondary | ICD-10-CM

## 2021-06-01 DIAGNOSIS — I6782 Cerebral ischemia: Secondary | ICD-10-CM

## 2021-06-01 NOTE — Progress Notes (Signed)
Cardiology Office Note Date:  06/05/2021  Patient ID:  Penny Miller, Penny Miller 1932-01-27, MRN [id number] PCP:  [name], MD  Cardiologist:  Dr. Percival Spanish (2017) Electrophysiologist: [name]    Chief Complaint:  31mo  History of Present Illness: Penny Miller is a 88 y.o. 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  She comes in today to be seen for [name], last seen by him Jan 2021, discussed noted sinus pauses without syncope > atenolol stopped. She was doing OK, no changes were made.  I saw her June 2022 She comes accompanied by her son. She was doing very well until her PMD team stopped her gabapentin. She started to feel awful and subsequently resumed and doing better.  Her son mentions a fainting spell she had, back in Nov-Dec time, was coming back from the doctor, recently hospitalized with GIB and as she got to the from door up the steps she fainted, this was brief and without warning, woke quickly did not fall, he was with her, no injury. Has not happened again. She has a long hx of feeling lightheaded especially when she bends forward/down, and infrequently will get a little lightheaded though this has warning and fades off quickly 2 weeks ago had CP, was central/left chest described as sharp sensation, lasted all day and constant regardless of position/activity. She woke the following day with it, and drank some ginger ale thinking maybe heartburn and did seem to help Has not had it again Feet/toes tend to get reddened when seated especially No recurrent bleeding or signs of bleeding  She had a loud carotid bruit, planned for [place] >> mild plaque only Unable to interrogate her loop any longer, though last transmission was after her report of a syncopal episode noted no brady or pause episodes, planned to monitor for recurrent symptoms with some sinus node dysfunction  CP sounded  atypical planned to monitor and stress test if needed  She saw A. Sula Rumple, Aug 2022, she had not had any more syncope, did have a mechanical fall.  No changes were made.  TODAY She is accompanied by her son that she lives with. She is doing OK, though has had a couple falls These are a balance issue, mentions an example is bending forward to get something and loses her balance and falls. They do not think any are syncope. No rest SOB, some DOE though chronic for years, unchanged + for some CP, sometimes massage or changing position helps, belching can help, sometimes perhaps longer lasting though is difficult to get a clear picture. Her memory not perfect. Neither the pt or her son report as a new symptom, dates back some years, no changing or escalating . No palpitations  No bleeding or signs of bleeding   Device information MDT ILR implanted 07/31/2016,cryptogenic stroke >>> EOS   Past Medical History:  Diagnosis Date   Allergic rhinitis, cause unspecified    Anxiety state, unspecified    Arthritis    Colon polyp    Diaphragmatic hernia without mention of obstruction or gangrene    Diverticulosis of colon (without mention of hemorrhage)    Female stress incontinence    HTN (hypertension)    Insomnia, unspecified    Interstitial cystitis    surgery 02/01/2013   Irritable bowel syndrome    Memory loss    Other and unspecified hyperlipidemia    Other premature beats  Polyneuropathy in diabetes(357.2)    Postmenopausal bleeding    Proteinuria    Rectal ulcer    Skin cancer    Stroke ([place]) 2018   Type II or unspecified type diabetes mellitus with neurological manifestations, not stated as uncontrolled(250.60)    Unspecified essential hypertension    Viral warts, unspecified     Past Surgical History:  Procedure Laterality Date   ABDOMINAL HYSTERECTOMY  1977   [place]   CATARACT EXTRACTION, BILATERAL  02/10/2006   Dr.Groat     COLONOSCOPY  07/17/2004   polypectomy   CYSTOSCOPY  1987   Dr.Bradley   LOOP RECORDER INSERTION N/A 07/31/2016   Procedure: Loop Recorder Insertion;  Surgeon: [name], MD;  Location: [facility];  Service: Cardiovascular;  Laterality: N/A;   TEE WITHOUT CARDIOVERSION N/A 07/30/2016   Procedure: TRANSESOPHAGEAL ECHOCARDIOGRAM (TEE);  Surgeon: [name], MD;  [facility];  Service: Cardiovascular;  Laterality: N/A;   TRIGGER FINGER RELEASE  1990   [place]  05/13/16   urinary bladder      surgery 02/01/2013 to stretch bladder stem    Current Outpatient Medications  Medication Sig Dispense Refill   ACCU-CHEK AVIVA PLUS test strip USE TO TEST BLOOD SUGAR TWICE DAILY. 100 each 11   acetaminophen (TYLENOL) 500 MG tablet Take 500 mg by mouth every 6 (six) hours as needed (pain).     apixaban (ELIQUIS) 2.5 MG TABS tablet TAKE 1 TABLET TWICE A DAY 180 tablet 1   atorvastatin (LIPITOR) 40 MG tablet Take 1 tablet (40 mg total) by mouth at bedtime. 90 tablet 1   citalopram (CELEXA) 10 MG tablet Take 1 tablet (10 mg total) by mouth daily. 90 tablet 1   gabapentin (NEURONTIN) 300 MG capsule TAKE 1 CAPSULE THREE TIMES A DAY 270 capsule 3   hyoscyamine (LEVSIN SL) 0.125 MG SL tablet Place 1 tablet (0.125 mg total) under the tongue every 4 (four) hours as needed. Take tablet every 4-6 hours for abdominal pain and cramping 15 tablet 2   Iron-Vitamin C 65-125 MG TABS Take 1 tablet by mouth every morning.     Lancets (ACCU-CHEK MULTICLIX) lancets Use to test blood sugar twice daily. E11.29 100 each 11   levocetirizine (XYZAL) 5 MG tablet TAKE 1 TABLET EVERY EVENING 90 tablet 3   NONFORMULARY OR COMPOUNDED ITEM Transport chair   #1    dx leg and hip pain s/p fall 1 each 0   NONFORMULARY OR COMPOUNDED ITEM Rollator walker with seat    #1   dx gait abnormality 1 each 0   olmesartan (BENICAR) 40 MG tablet Take 1 tablet (40 mg total) by mouth daily. 90 tablet 3    ondansetron (ZOFRAN ODT) 4 MG disintegrating tablet Take 1 tablet (4 mg total) by mouth every 6 (six) hours. (Patient taking differently: Take 4 mg by mouth as needed.) 15 tablet 5   pioglitazone (ACTOS) 15 MG tablet Take 1 tablet (15 mg total) by mouth daily. 90 tablet 1   traMADol (ULTRAM) 50 MG tablet Take 1 tablet (50 mg total) by mouth every 6 (six) hours as needed. 30 tablet 0   triamcinolone cream (KENALOG) 0.5 % APPLY 1 APPLICATION TOPICALLY TWICE A DAY 450 g 3   Vitamin D, Ergocalciferol, (DRISDOL) 1.25 MG (50000 UNIT) CAPS capsule TAKE 1 CAPSULE EVERY 7 DAYS 12 capsule 0   No current facility-administered medications for this visit.  Allergies:   Jardiance [empagliflozin], Metformin and related, Codeine, Metoprolol, Relafen [nabumetone], Septra [sulfamethoxazole-trimethoprim], Tetracycline, Adhesive [tape], Ceclor [cefaclor], Cefaclor, Codeine, Jardiance [empagliflozin], Lopid [gemfibrozil], Lopid [gemfibrozil], Lopressor [metoprolol tartrate], Metformin and related, Morphine and related, Morphine and related, Niacin, Niacin and related, Niacin and related, Relafen [nabumetone], Septra [sulfamethoxazole-trimethoprim], Tape, Tetracyclines & related, Toprol xl [metoprolol tartrate], and Toprol xl [metoprolol]   Social History:  The patient  reports that she quit smoking about 10 years ago. Her smoking use included cigarettes. She has a 15.50 pack-year smoking history. She has never used smokeless tobacco. She reports that she does not drink alcohol and does not use drugs.   Family History:  The patient's family history includes Bladder Cancer in her father; COPD in her sister; Diabetes in her brother, brother, and sister; Heart attack in her brother and father; Heart disease in her brother; Heart disease (age of onset: 89) in her brother; Hypertension in her brother, sister, and son; Pancreatic cancer (age of onset: 42) in her mother.  ROS:  Please see the history of present illness.     All other systems are reviewed and otherwise negative.   PHYSICAL EXAM:  VS:  BP 128/78    Pulse (!) 51    Ht 5\' 7"  (1.702 m)    Wt 168 lb (76.2 kg)    SpO2 96%    BMI 26.31 kg/m  BMI: Body mass index is 26.31 kg/m. Well nourished, well developed, in no acute distress HEENT: normocephalic, atraumatic Neck: no JVD, carotid bruits or masses Cardiac:  RRR; no significant murmurs, no rubs, or gallops Lungs:  CTA b/l, no wheezing, rhonchi or rales Abd: soft, nontender MS: no deformity, age appropriate atrophy Ext: no edema, numerous spider veins and some larger varicose veins, appears more dependent rubor, she has pedal pulses and brisk cap refill Skin: warm and dry, no rash Neuro:  No gross deficits appreciated Psych: euthymic mood, full affect  ILR site is stable, no tethering or discomfort   EKG:  done today and reviewed by myself SB 51bpm, no acute changes  09/29/20: Carotid US Summary:  Right Carotid: Velocities in the right ICA are consistent with a 1-39%  stenosis.                 Non-hemodynamically significant plaque <50% noted in the  CCA.   Left Carotid: Velocities in the left ICA are consistent with a 1-39%  stenosis.                Non-hemodynamically significant plaque <50% noted in the  CCA.   Vertebrals:  Bilateral vertebral arteries demonstrate antegrade flow.  Subclavians: Right subclavian artery was stenotic. Right subclavian artery  flow               was disturbed. Normal flow hemodynamics were seen in the left               subclavian artery.   11/19/2018: TTE IMPRESSIONS   1. The left ventricle has normal systolic function with an ejection  fraction of 60-65%. The cavity size was normal. Left ventricular diastolic  Doppler parameters are consistent with pseudonormalization.   2. The right ventricle has normal systolic function. The cavity was  normal. There is no increase in right ventricular wall thickness. Right  ventricular systolic pressure is  moderately elevated with an estimated  pressure of 39.4 mmHg.   3. Left atrial size was mild-moderately dilated.   4. Right atrial size was mildly dilated.  5. No evidence of mitral valve stenosis.   6. The tricuspid valve is grossly normal.   7. No stenosis of the aortic valve.   8. The aorta is normal unless otherwise noted.   9. The aortic root and ascending aorta are normal in size and structure.  10. Pulmonary hypertension is moderate.  11. The atrial septum is grossly normal.   Recent Labs: 05/28/2021: ALT 7; BUN 25; Creatinine, Ser 1.51; Hemoglobin 13.4; Platelets 170.0; Potassium 4.2; Sodium 142; TSH 4.55  05/28/2021: Cholesterol 166; HDL 44.10; LDL Cholesterol 84; Total CHOL/HDL Ratio 4; Triglycerides 192.0; VLDL 38.4   Estimated Creatinine Clearance: 27.9 mL/min (A) (by C-G formula based on SCr of 1.51 mg/dL (H)).   Wt Readings from Last 3 Encounters:  06/05/21 168 lb (76.2 kg)  05/31/21 167 lb 9.6 oz (76 kg)  05/25/21 164 lb 12.8 oz (74.8 kg)     Other studies reviewed: Additional studies/records reviewed today include: summarized above  ASSESSMENT AND PLAN:  1. Paroxysmal AFib     CHA2DS2Vasc is 8, on Eliquis,  appropriately dosed     No symptoms of palpitations/Afib  2. Sinus node dysfunction     No symptoms of bradycardia  3. HTN     Looks OK  4. Chronic diastolic CHF     She does not appear volume OL  5. Loud R carotid bruit     Not appreciated to day     No significant dz by [place]     On statin  6. CP     Sounds mostly atypical     Chronic and unchanging     Discussed management w/u options, for now, will observe behavior, no w/u at this time  Disposition: back in 6mo, sooner if needed   Current medicines are reviewed at length with the patient today.  The patient did not have any concerns regarding medicines.  Venetia Night, PA-C 06/05/2021 12:18 PM     [place] [address] [phone]  (office)  [phone] (fax)

## 2021-06-01 NOTE — Telephone Encounter (Signed)
Sree with [facility] calls today in regards to verbals for PT's physical therapy. Physical therapy order includes: ?Twice week for three weeks ?Once a week for one week ?Twice a week for 2 weeks ?Once a week for three weeks ?Sree would also like to note PT had an interaction between her citalopram and her ondansetron. ? ?CB: [phone]  ?

## 2021-06-01 NOTE — Telephone Encounter (Signed)
[place] [facility] for palliative care referral? ?

## 2021-06-01 NOTE — Telephone Encounter (Signed)
Penny Miller with [facility] nursing is calling to state that she didn't pick pt up for [facility] but [facility] PT did. ? ?Family is requesting a Palliative care consult referral at this time. ? ?FYI ?

## 2021-06-01 NOTE — Telephone Encounter (Signed)
Verbal orders given to proceed with PT requested below ?

## 2021-06-04 ENCOUNTER — Ambulatory Visit: Payer: Medicare Other | Admitting: Podiatry

## 2021-06-04 NOTE — Progress Notes (Signed)
Right knee x-ray shows medium arthritis

## 2021-06-04 NOTE — Progress Notes (Signed)
Left knee x-ray shows medium arthritis.

## 2021-06-05 ENCOUNTER — Ambulatory Visit (INDEPENDENT_AMBULATORY_CARE_PROVIDER_SITE_OTHER): Payer: Medicare Other | Admitting: Physician Assistant

## 2021-06-05 ENCOUNTER — Other Ambulatory Visit: Payer: Self-pay

## 2021-06-05 ENCOUNTER — Encounter: Payer: Self-pay | Admitting: Physician Assistant

## 2021-06-05 VITALS — BP 128/78 | HR 51 | Ht 67.0 in | Wt 168.0 lb

## 2021-06-05 DIAGNOSIS — I10 Essential (primary) hypertension: Secondary | ICD-10-CM | POA: Diagnosis not present

## 2021-06-05 DIAGNOSIS — I480 Paroxysmal atrial fibrillation: Secondary | ICD-10-CM | POA: Diagnosis not present

## 2021-06-05 DIAGNOSIS — I63442 Cerebral infarction due to embolism of left cerebellar artery: Secondary | ICD-10-CM

## 2021-06-05 DIAGNOSIS — I5032 Chronic diastolic (congestive) heart failure: Secondary | ICD-10-CM | POA: Diagnosis not present

## 2021-06-05 DIAGNOSIS — R079 Chest pain, unspecified: Secondary | ICD-10-CM

## 2021-06-05 NOTE — Patient Instructions (Signed)
Medication Instructions:  ? ?Your physician recommends that you continue on your current medications as directed. Please refer to the Current Medication list given to you today. ? ?*If you need a refill on your cardiac medications before your next appointment, please call your pharmacy* ? ? ?Lab Work:  [place] ? ? ?If you have labs (blood work) drawn today and your tests are completely normal, you will receive your results only by: ?MyChart Message (if you have MyChart) OR ?A paper copy in the mail ?If you have any lab test that is abnormal or we need to change your treatment, we will call you to review the results. ? ? ?Testing/Procedures: NONE ORDERED  TODAY ? ? ?Follow-Up: ?At [facility], you and your health needs are our priority.  As part of our continuing mission to provide you with exceptional heart care, we have created designated Provider Care Teams.  These Care Teams include your primary Cardiologist (physician) and Advanced Practice Providers (APPs -  Physician Assistants and Nurse Practitioners) who all work together to provide you with the care you need, when you need it. ? ?We recommend signing up for the patient portal called "MyChart".  Sign up information is provided on this After Visit Summary.  MyChart is used to connect with patients for Virtual Visits (Telemedicine).  Patients are able to view lab/test results, encounter notes, upcoming appointments, etc.  Non-urgent messages can be sent to your provider as well.   ?To learn more about what you can do with MyChart, go to [email or web address].   ? ?Your next appointment:   ?6 month(s) ? ?The format for your next appointment:   ?In Person ? ?Provider:   ?You may see Cristopher Peru, MD or one of the following Advanced Practice Providers on your designated Care Team:   ?Tommye Standard, PA-C ?Legrand Como "Jonni Sanger" Chalmers Cater, PA-C{ ? ?  ? ? ?Other Instructions ? ?

## 2021-06-06 DIAGNOSIS — E1142 Type 2 diabetes mellitus with diabetic polyneuropathy: Secondary | ICD-10-CM | POA: Diagnosis not present

## 2021-06-06 DIAGNOSIS — E1122 Type 2 diabetes mellitus with diabetic chronic kidney disease: Secondary | ICD-10-CM | POA: Diagnosis not present

## 2021-06-06 DIAGNOSIS — I480 Paroxysmal atrial fibrillation: Secondary | ICD-10-CM | POA: Diagnosis not present

## 2021-06-06 DIAGNOSIS — I129 Hypertensive chronic kidney disease with stage 1 through stage 4 chronic kidney disease, or unspecified chronic kidney disease: Secondary | ICD-10-CM | POA: Diagnosis not present

## 2021-06-06 DIAGNOSIS — N184 Chronic kidney disease, stage 4 (severe): Secondary | ICD-10-CM | POA: Diagnosis not present

## 2021-06-06 DIAGNOSIS — M12561 Traumatic arthropathy, right knee: Secondary | ICD-10-CM | POA: Diagnosis not present

## 2021-06-08 DIAGNOSIS — E1142 Type 2 diabetes mellitus with diabetic polyneuropathy: Secondary | ICD-10-CM | POA: Diagnosis not present

## 2021-06-08 DIAGNOSIS — Z86010 Personal history of colonic polyps: Secondary | ICD-10-CM

## 2021-06-08 DIAGNOSIS — E785 Hyperlipidemia, unspecified: Secondary | ICD-10-CM | POA: Diagnosis not present

## 2021-06-08 DIAGNOSIS — E1122 Type 2 diabetes mellitus with diabetic chronic kidney disease: Secondary | ICD-10-CM | POA: Diagnosis not present

## 2021-06-08 DIAGNOSIS — G4700 Insomnia, unspecified: Secondary | ICD-10-CM | POA: Diagnosis not present

## 2021-06-08 DIAGNOSIS — Z95818 Presence of other cardiac implants and grafts: Secondary | ICD-10-CM

## 2021-06-08 DIAGNOSIS — Z8619 Personal history of other infectious and parasitic diseases: Secondary | ICD-10-CM

## 2021-06-08 DIAGNOSIS — M12561 Traumatic arthropathy, right knee: Secondary | ICD-10-CM | POA: Diagnosis not present

## 2021-06-08 DIAGNOSIS — K589 Irritable bowel syndrome without diarrhea: Secondary | ICD-10-CM | POA: Diagnosis not present

## 2021-06-08 DIAGNOSIS — J309 Allergic rhinitis, unspecified: Secondary | ICD-10-CM

## 2021-06-08 DIAGNOSIS — Z9181 History of falling: Secondary | ICD-10-CM

## 2021-06-08 DIAGNOSIS — Z8673 Personal history of transient ischemic attack (TIA), and cerebral infarction without residual deficits: Secondary | ICD-10-CM

## 2021-06-08 DIAGNOSIS — R413 Other amnesia: Secondary | ICD-10-CM | POA: Diagnosis not present

## 2021-06-08 DIAGNOSIS — F411 Generalized anxiety disorder: Secondary | ICD-10-CM | POA: Diagnosis not present

## 2021-06-08 DIAGNOSIS — N184 Chronic kidney disease, stage 4 (severe): Secondary | ICD-10-CM | POA: Diagnosis not present

## 2021-06-08 DIAGNOSIS — Z87440 Personal history of urinary (tract) infections: Secondary | ICD-10-CM

## 2021-06-08 DIAGNOSIS — Z7901 Long term (current) use of anticoagulants: Secondary | ICD-10-CM

## 2021-06-08 DIAGNOSIS — N393 Stress incontinence (female) (male): Secondary | ICD-10-CM

## 2021-06-08 DIAGNOSIS — I480 Paroxysmal atrial fibrillation: Secondary | ICD-10-CM | POA: Diagnosis not present

## 2021-06-08 DIAGNOSIS — K449 Diaphragmatic hernia without obstruction or gangrene: Secondary | ICD-10-CM | POA: Diagnosis not present

## 2021-06-08 DIAGNOSIS — W19XXXD Unspecified fall, subsequent encounter: Secondary | ICD-10-CM

## 2021-06-08 DIAGNOSIS — I129 Hypertensive chronic kidney disease with stage 1 through stage 4 chronic kidney disease, or unspecified chronic kidney disease: Secondary | ICD-10-CM | POA: Diagnosis not present

## 2021-06-11 DIAGNOSIS — N184 Chronic kidney disease, stage 4 (severe): Secondary | ICD-10-CM | POA: Diagnosis not present

## 2021-06-11 DIAGNOSIS — E1122 Type 2 diabetes mellitus with diabetic chronic kidney disease: Secondary | ICD-10-CM | POA: Diagnosis not present

## 2021-06-11 DIAGNOSIS — I480 Paroxysmal atrial fibrillation: Secondary | ICD-10-CM | POA: Diagnosis not present

## 2021-06-11 DIAGNOSIS — E1142 Type 2 diabetes mellitus with diabetic polyneuropathy: Secondary | ICD-10-CM | POA: Diagnosis not present

## 2021-06-11 DIAGNOSIS — M12561 Traumatic arthropathy, right knee: Secondary | ICD-10-CM | POA: Diagnosis not present

## 2021-06-11 DIAGNOSIS — I129 Hypertensive chronic kidney disease with stage 1 through stage 4 chronic kidney disease, or unspecified chronic kidney disease: Secondary | ICD-10-CM | POA: Diagnosis not present

## 2021-06-12 ENCOUNTER — Telehealth: Payer: Self-pay

## 2021-06-12 DIAGNOSIS — Z20822 Contact with and (suspected) exposure to covid-19: Secondary | ICD-10-CM | POA: Diagnosis not present

## 2021-06-12 NOTE — Telephone Encounter (Signed)
Spoke with patient's son [name] and scheduled a Mychart Palliative Consult for 06/18/21 @ 1 PM.  ? ?Consent obtained; updated Outlook/Netsmart/Team List and Epic.  ? ?

## 2021-06-13 DIAGNOSIS — I129 Hypertensive chronic kidney disease with stage 1 through stage 4 chronic kidney disease, or unspecified chronic kidney disease: Secondary | ICD-10-CM | POA: Diagnosis not present

## 2021-06-13 DIAGNOSIS — M12561 Traumatic arthropathy, right knee: Secondary | ICD-10-CM | POA: Diagnosis not present

## 2021-06-13 DIAGNOSIS — E1142 Type 2 diabetes mellitus with diabetic polyneuropathy: Secondary | ICD-10-CM | POA: Diagnosis not present

## 2021-06-13 DIAGNOSIS — N184 Chronic kidney disease, stage 4 (severe): Secondary | ICD-10-CM | POA: Diagnosis not present

## 2021-06-13 DIAGNOSIS — I480 Paroxysmal atrial fibrillation: Secondary | ICD-10-CM | POA: Diagnosis not present

## 2021-06-13 DIAGNOSIS — E1122 Type 2 diabetes mellitus with diabetic chronic kidney disease: Secondary | ICD-10-CM | POA: Diagnosis not present

## 2021-06-15 ENCOUNTER — Telehealth: Payer: Self-pay

## 2021-06-15 DIAGNOSIS — I129 Hypertensive chronic kidney disease with stage 1 through stage 4 chronic kidney disease, or unspecified chronic kidney disease: Secondary | ICD-10-CM | POA: Diagnosis not present

## 2021-06-15 DIAGNOSIS — E1142 Type 2 diabetes mellitus with diabetic polyneuropathy: Secondary | ICD-10-CM | POA: Diagnosis not present

## 2021-06-15 DIAGNOSIS — E1122 Type 2 diabetes mellitus with diabetic chronic kidney disease: Secondary | ICD-10-CM | POA: Diagnosis not present

## 2021-06-15 DIAGNOSIS — M12561 Traumatic arthropathy, right knee: Secondary | ICD-10-CM | POA: Diagnosis not present

## 2021-06-15 DIAGNOSIS — N184 Chronic kidney disease, stage 4 (severe): Secondary | ICD-10-CM | POA: Diagnosis not present

## 2021-06-15 DIAGNOSIS — I480 Paroxysmal atrial fibrillation: Secondary | ICD-10-CM | POA: Diagnosis not present

## 2021-06-15 NOTE — Telephone Encounter (Signed)
Patient's [name] left a voicemail to cancel Palliative consult scheduled for 3/20 with Dr. Mariea Clonts and declined services at this time. Appointment and referral canceled.  ?

## 2021-06-18 ENCOUNTER — Telehealth: Payer: Medicare Other | Admitting: Internal Medicine

## 2021-06-20 DIAGNOSIS — E1122 Type 2 diabetes mellitus with diabetic chronic kidney disease: Secondary | ICD-10-CM | POA: Diagnosis not present

## 2021-06-20 DIAGNOSIS — N184 Chronic kidney disease, stage 4 (severe): Secondary | ICD-10-CM | POA: Diagnosis not present

## 2021-06-20 DIAGNOSIS — E1142 Type 2 diabetes mellitus with diabetic polyneuropathy: Secondary | ICD-10-CM | POA: Diagnosis not present

## 2021-06-20 DIAGNOSIS — I129 Hypertensive chronic kidney disease with stage 1 through stage 4 chronic kidney disease, or unspecified chronic kidney disease: Secondary | ICD-10-CM | POA: Diagnosis not present

## 2021-06-20 DIAGNOSIS — M12561 Traumatic arthropathy, right knee: Secondary | ICD-10-CM | POA: Diagnosis not present

## 2021-06-20 DIAGNOSIS — I480 Paroxysmal atrial fibrillation: Secondary | ICD-10-CM | POA: Diagnosis not present

## 2021-06-25 DIAGNOSIS — M12561 Traumatic arthropathy, right knee: Secondary | ICD-10-CM | POA: Diagnosis not present

## 2021-06-25 DIAGNOSIS — I129 Hypertensive chronic kidney disease with stage 1 through stage 4 chronic kidney disease, or unspecified chronic kidney disease: Secondary | ICD-10-CM | POA: Diagnosis not present

## 2021-06-25 DIAGNOSIS — I480 Paroxysmal atrial fibrillation: Secondary | ICD-10-CM | POA: Diagnosis not present

## 2021-06-25 DIAGNOSIS — E1142 Type 2 diabetes mellitus with diabetic polyneuropathy: Secondary | ICD-10-CM | POA: Diagnosis not present

## 2021-06-25 DIAGNOSIS — E1122 Type 2 diabetes mellitus with diabetic chronic kidney disease: Secondary | ICD-10-CM | POA: Diagnosis not present

## 2021-06-25 DIAGNOSIS — N184 Chronic kidney disease, stage 4 (severe): Secondary | ICD-10-CM | POA: Diagnosis not present

## 2021-06-27 ENCOUNTER — Other Ambulatory Visit: Payer: Self-pay

## 2021-06-27 ENCOUNTER — Encounter: Payer: Self-pay | Admitting: Podiatry

## 2021-06-27 ENCOUNTER — Ambulatory Visit (INDEPENDENT_AMBULATORY_CARE_PROVIDER_SITE_OTHER): Payer: Medicare Other | Admitting: Podiatry

## 2021-06-27 DIAGNOSIS — E1142 Type 2 diabetes mellitus with diabetic polyneuropathy: Secondary | ICD-10-CM

## 2021-06-27 DIAGNOSIS — M79674 Pain in right toe(s): Secondary | ICD-10-CM | POA: Diagnosis not present

## 2021-06-27 DIAGNOSIS — L84 Corns and callosities: Secondary | ICD-10-CM

## 2021-06-27 DIAGNOSIS — M79675 Pain in left toe(s): Secondary | ICD-10-CM

## 2021-06-27 DIAGNOSIS — Q828 Other specified congenital malformations of skin: Secondary | ICD-10-CM

## 2021-06-27 DIAGNOSIS — B351 Tinea unguium: Secondary | ICD-10-CM

## 2021-06-28 DIAGNOSIS — N184 Chronic kidney disease, stage 4 (severe): Secondary | ICD-10-CM | POA: Diagnosis not present

## 2021-06-28 DIAGNOSIS — Z86010 Personal history of colonic polyps: Secondary | ICD-10-CM | POA: Diagnosis not present

## 2021-06-28 DIAGNOSIS — G4700 Insomnia, unspecified: Secondary | ICD-10-CM | POA: Diagnosis not present

## 2021-06-28 DIAGNOSIS — Z8619 Personal history of other infectious and parasitic diseases: Secondary | ICD-10-CM | POA: Diagnosis not present

## 2021-06-28 DIAGNOSIS — Z7901 Long term (current) use of anticoagulants: Secondary | ICD-10-CM | POA: Diagnosis not present

## 2021-06-28 DIAGNOSIS — I129 Hypertensive chronic kidney disease with stage 1 through stage 4 chronic kidney disease, or unspecified chronic kidney disease: Secondary | ICD-10-CM | POA: Diagnosis not present

## 2021-06-28 DIAGNOSIS — E1122 Type 2 diabetes mellitus with diabetic chronic kidney disease: Secondary | ICD-10-CM | POA: Diagnosis not present

## 2021-06-28 DIAGNOSIS — J309 Allergic rhinitis, unspecified: Secondary | ICD-10-CM | POA: Diagnosis not present

## 2021-06-28 DIAGNOSIS — N393 Stress incontinence (female) (male): Secondary | ICD-10-CM | POA: Diagnosis not present

## 2021-06-28 DIAGNOSIS — K449 Diaphragmatic hernia without obstruction or gangrene: Secondary | ICD-10-CM | POA: Diagnosis not present

## 2021-06-28 DIAGNOSIS — Z8673 Personal history of transient ischemic attack (TIA), and cerebral infarction without residual deficits: Secondary | ICD-10-CM | POA: Diagnosis not present

## 2021-06-28 DIAGNOSIS — Z95818 Presence of other cardiac implants and grafts: Secondary | ICD-10-CM | POA: Diagnosis not present

## 2021-06-28 DIAGNOSIS — E785 Hyperlipidemia, unspecified: Secondary | ICD-10-CM | POA: Diagnosis not present

## 2021-06-28 DIAGNOSIS — F411 Generalized anxiety disorder: Secondary | ICD-10-CM | POA: Diagnosis not present

## 2021-06-28 DIAGNOSIS — W19XXXD Unspecified fall, subsequent encounter: Secondary | ICD-10-CM | POA: Diagnosis not present

## 2021-06-28 DIAGNOSIS — Z87440 Personal history of urinary (tract) infections: Secondary | ICD-10-CM | POA: Diagnosis not present

## 2021-06-28 DIAGNOSIS — M12561 Traumatic arthropathy, right knee: Secondary | ICD-10-CM | POA: Diagnosis not present

## 2021-06-28 DIAGNOSIS — E1142 Type 2 diabetes mellitus with diabetic polyneuropathy: Secondary | ICD-10-CM | POA: Diagnosis not present

## 2021-06-28 DIAGNOSIS — K589 Irritable bowel syndrome without diarrhea: Secondary | ICD-10-CM | POA: Diagnosis not present

## 2021-06-28 DIAGNOSIS — R413 Other amnesia: Secondary | ICD-10-CM | POA: Diagnosis not present

## 2021-06-28 DIAGNOSIS — I480 Paroxysmal atrial fibrillation: Secondary | ICD-10-CM | POA: Diagnosis not present

## 2021-06-28 DIAGNOSIS — Z9181 History of falling: Secondary | ICD-10-CM | POA: Diagnosis not present

## 2021-06-29 NOTE — Progress Notes (Signed)
? ?I, Judy Pimple, am serving as a Education administrator for Dr. Lynne Leader. ? ?Penny Miller is a 88 y.o. female who presents to [employer] [employer] at [facility] today for f/u of B knee pain and swelling.  She was last seen by Dr. Georgina Snell on 05/31/21 and had a R knee steroid injection and was advised to use Voltaren gel.  She has been doing home health PT for balance/strength deficits.  Today, pt reports that her right knee is doing okay since the injection, but after her PT and exercises at home she can tell it gets very sore. Patient takes the tramadol and that helps with that pain. Patient states that she is getting better just worse after PT.  Home health Pt goals are to improve gait and reduce fall risk.  ? ?Diagnostic testing: R and L knee XR- 05/31/21 ? ?Pertinent review of systems: No fevers or chills ? ?Relevant historical information: Diabetes.  CKD 4 ? ? ?Exam:  ?BP 124/74   Pulse 63   Ht '5\' 7"'$  (1.702 m)   Wt 158 lb (71.7 kg)   SpO2 96%   BMI 24.75 kg/m?  ?General: Well Developed, well nourished, and in no acute distress.  ? ?MSK: Left knee mild effusion normal motion with crepitation. ? ? ? ?Lab and Radiology Results ? ?Procedure: Real-time Ultrasound Guided Injection of left knee superior lateral patellar space ?Device: Philips Affiniti 50G ?Images permanently stored and available for review in PACS ?Verbal informed consent obtained.  Discussed risks and benefits of procedure. Warned about infection bleeding damage to structures skin hypopigmentation and fat atrophy among others. ?Patient expresses understanding and agreement ?Time-out conducted.   ?Noted no overlying erythema, induration, or other signs of local infection.   ?Skin prepped in a sterile fashion.   ?Local anesthesia: Topical Ethyl chloride.   ?With sterile technique and under real time ultrasound guidance: 40 mg of Kenalog and 2 mL of Marcaine injected into knee joint. Fluid seen entering the joint capsule.   ?Completed without  difficulty   ?Pain immediately resolved suggesting accurate placement of the medication.   ?Advised to call if fevers/chills, erythema, induration, drainage, or persistent bleeding.   ?Images permanently stored and available for review in the ultrasound unit.  ?Impression: Technically successful ultrasound guided injection. ? ? ?EXAM: ?RIGHT KNEE 3 VIEWS; LEFT KNEE 3 VIEWS ?  ?COMPARISON:  None. ?  ?FINDINGS: ?Right knee: ?  ?Mild-to-moderate lateral compartment joint space narrowing. ?Mild-to-moderate patella alta. Mild patellofemoral joint space ?narrowing. No joint effusion. No acute fracture or dislocation. Mild ?vascular calcifications. ?  ?Left knee: ?  ?Mild-to-moderate lateral compartment joint space narrowing. ?Mild-to-moderate patella alta. No joint effusion. No acute fracture ?or dislocation. Mild-to-moderate vascular calcifications. ?  ?IMPRESSION:: ?IMPRESSION: ?1. 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 ?2. Bilateral mild-to-moderate lateral compartment osteoarthritis. ?  ?  ?Electronically Signed ?  By: Yvonne Kendall M.D. ?  On: 06/01/2021 21:06 ?  ?I, Lynne Leader, personally (independently) visualized and performed the interpretation of the images attached in this note. ? ? ? ? ? ? ?Assessment and Plan: ?88 y.o. female with bilateral knee pain thought to be due to DJD.  She is doing well with right knee injection at the last visit.  Plan for left knee steroid injection today.  Plan to continue PT as its helping with fall prevention and leg strength.  Recommend Tylenol prior to her physical therapy visits.  Recommend trying to wean off of tramadol if possible or at least using as  a second line medication for pain. ? ?Recheck back with me as needed. ? ? ?PDMP not reviewed this encounter. ?Orders Placed This Encounter  ?Procedures  ? [place] LIMITED JOINT SPACE STRUCTURES LOW BILAT(NO LINKED CHARGES)  ?  Order Specific Question:   Reason for Exam (SYMPTOM  OR DIAGNOSIS REQUIRED)  ?  Answer:   B knee pain  ?   Order Specific Question:   Preferred imaging location?  ?  Answer:   [place]  ? ?No orders of the defined types were placed in this encounter. ? ? ? ?Discussed warning signs or symptoms. Please see discharge instructions. Patient expresses understanding. ? ? ?The above documentation has been reviewed and is accurate and complete Lynne Leader, M.D. ? ? ?

## 2021-07-01 NOTE — Progress Notes (Signed)
?Subjective:  ?Patient ID: Penny Miller, female    DOB: [birth year],  MRN: [id number] ? ?Penny Miller presents to clinic today for at risk foot care with history of diabetic neuropathy and callus(es) b/l lower extremities and painful thick toenails that are difficult to trim. Painful toenails interfere with ambulation. Aggravating factors include wearing enclosed shoe gear. Pain is relieved with periodic professional debridement. Painful calluses are aggravated when weightbearing with and without shoegear. Pain is relieved with periodic professional debridement. ? ?Last HgA1c was 6.7%. States she hasn't checked her blood glucose in "a while". ? ?New problem(s): None.  ? ?Her son, Penny Miller, is present during today's visit. ? ?PCP is [name], MD , and last visit was March 13, 2021. ? ?Allergies  ?Allergen Reactions  ? Jardiance [Empagliflozin] Diarrhea  ? Metformin And Related Diarrhea  ? Codeine Nausea And Vomiting  ? Metoprolol Nausea And Vomiting  ? Relafen [Nabumetone] Nausea And Vomiting  ? Septra [Sulfamethoxazole-Trimethoprim] Nausea And Vomiting  ? Tetracycline Nausea And Vomiting  ? Adhesive [Tape] Rash and Other (See Comments)  ?  Tears the skin also  ? Ceclor [Cefaclor] Other (See Comments)  ?  Unknown reaction; not recalled  ? Cefaclor Other (See Comments)  ?  Unknown reaction  ? Codeine Nausea And Vomiting  ? Jardiance [Empagliflozin] Diarrhea  ? Lopid [Gemfibrozil] Other (See Comments)  ?  Unknown reaction; not recalled  ? Lopid [Gemfibrozil] Other (See Comments)  ?  Unknown reaction  ? Lopressor [Metoprolol Tartrate] Nausea And Vomiting  ? Metformin And Related Diarrhea  ? Morphine And Related Other (See Comments)  ?  Passed out  ? Morphine And Related Other (See Comments)  ?  Passed out  ? Niacin Nausea And Vomiting  ?  Unknown reaction ?Unknown reaction; not recalled  ? Niacin And Related Other (See Comments)  ?  Unknown reaction; not recalled  ? Niacin And Related Other (See Comments)  ?   Unknown reaction  ? Relafen [Nabumetone] Nausea And Vomiting  ? Septra [Sulfamethoxazole-Trimethoprim] Nausea And Vomiting  ? Tape Rash and Other (See Comments)  ?  Tears skin  ? Tetracyclines & Related Nausea And Vomiting  ? Toprol Xl [Metoprolol Tartrate] Nausea And Vomiting  ? Toprol Xl [Metoprolol] Nausea And Vomiting  ? ? ?Review of Systems: Negative except as noted in the HPI. ? ?Objective: No changes noted in today's physical examination. ? ?Penny Miller is a pleasant 88 y.o. female in NAD. AAO X 3. ? ?Vascular Examination: ?CFT <4 seconds b/l LE. Palpable DP pulse(s) b/l LE. Palpable PT pulse(s) b/l LE. Pedal hair absent. No pain with calf compression RLE. Trace edema noted BLE. Varicosities present b/l. No cyanosis or clubbing noted b/l LE. ? ?Dermatological Examination: ?Pedal skin is warm and supple b/l LE. Resolving ecchymosis noted dorsal aspect of left foot. No open wounds b/l LE. No interdigital macerations noted b/l LE. Toenails 1-5 b/l elongated, discolored, dystrophic, thickened, crumbly with subungual debris and tenderness to dorsal palpation. Hyperkeratotic lesion(s) bilateral great toes. Porokeratotic lesions plantar right heel with tenderness to palpation. No erythema, no edema, no drainage, no flocculence.  No erythema, no edema, no drainage, no fluctuance. ? ?Musculoskeletal Examination: ?Muscle strength 5/5 to all lower extremity muscle groups bilaterally. Wearing [employer] shoes. No pain, crepitus or joint limitation noted with ROM bilateral LE. No gross bony deformities bilaterally. Utilizes rollator for ambulation assistance. ? ?Neurological Examination: ?Pt has subjective symptoms of neuropathy. Protective sensation intact 5/5 intact bilaterally with 10g  monofilament b/l. Vibratory sensation intact b/l. Proprioception intact bilaterally. ? ? ?  Latest Ref Rng & Units 05/28/2021  ?  2:19 PM 07/19/2020  ? 11:26 AM  ?Hemoglobin A1C  ?Hemoglobin-A1c 4.6 - 6.5 % 6.7   6.7     ? ?Assessment/Plan: ?1. Pain due to onychomycosis of toenails of both feet   ?2. Callus   ?3. Porokeratosis   ?4. Diabetic peripheral neuropathy ([place])   ?-Patient was evaluated and treated. All patient's and/or POA's questions/concerns answered on today's visit. ?-Toenails 1-5 b/l were debrided in length and girth with sterile nail nippers and dremel without iatrogenic bleeding.  ?-Callus(es) bilateral great toes pared utilizing sterile scalpel blade without complication or incident. Total number debrided =2. ?-Painful porokeratotic lesion(s) right heel pared and enucleated with sterile scalpel blade without incident. Total number of lesions debrided=1. ?-Patient/POA to call should there be question/concern in the interim.  ? ?Return in about 3 months (around 09/27/2021). ? ?Marzetta Board, DPM  ?

## 2021-07-02 ENCOUNTER — Ambulatory Visit (INDEPENDENT_AMBULATORY_CARE_PROVIDER_SITE_OTHER): Payer: Medicare Other | Admitting: Family Medicine

## 2021-07-02 ENCOUNTER — Ambulatory Visit: Payer: Self-pay

## 2021-07-02 ENCOUNTER — Other Ambulatory Visit: Payer: Self-pay | Admitting: Internal Medicine

## 2021-07-02 VITALS — BP 124/74 | HR 63 | Ht 67.0 in | Wt 158.0 lb

## 2021-07-02 DIAGNOSIS — G8929 Other chronic pain: Secondary | ICD-10-CM

## 2021-07-02 DIAGNOSIS — M25561 Pain in right knee: Secondary | ICD-10-CM

## 2021-07-02 DIAGNOSIS — I129 Hypertensive chronic kidney disease with stage 1 through stage 4 chronic kidney disease, or unspecified chronic kidney disease: Secondary | ICD-10-CM | POA: Diagnosis not present

## 2021-07-02 DIAGNOSIS — I10 Essential (primary) hypertension: Secondary | ICD-10-CM

## 2021-07-02 DIAGNOSIS — M12561 Traumatic arthropathy, right knee: Secondary | ICD-10-CM | POA: Diagnosis not present

## 2021-07-02 DIAGNOSIS — E1122 Type 2 diabetes mellitus with diabetic chronic kidney disease: Secondary | ICD-10-CM | POA: Diagnosis not present

## 2021-07-02 DIAGNOSIS — M25562 Pain in left knee: Secondary | ICD-10-CM

## 2021-07-02 DIAGNOSIS — I480 Paroxysmal atrial fibrillation: Secondary | ICD-10-CM | POA: Diagnosis not present

## 2021-07-02 DIAGNOSIS — I63442 Cerebral infarction due to embolism of left cerebellar artery: Secondary | ICD-10-CM

## 2021-07-02 DIAGNOSIS — E1142 Type 2 diabetes mellitus with diabetic polyneuropathy: Secondary | ICD-10-CM | POA: Diagnosis not present

## 2021-07-02 DIAGNOSIS — N184 Chronic kidney disease, stage 4 (severe): Secondary | ICD-10-CM | POA: Diagnosis not present

## 2021-07-02 NOTE — Patient Instructions (Addendum)
Good to see you  ?Injection in left knee today ?Call or go to the ER if you develop a large red swollen joint with extreme pain or oozing puss.  ? ?Follow up as needed ?

## 2021-07-04 ENCOUNTER — Inpatient Hospital Stay: Payer: Medicare Other

## 2021-07-04 ENCOUNTER — Inpatient Hospital Stay: Payer: Medicare Other | Admitting: Oncology

## 2021-07-06 DIAGNOSIS — M12561 Traumatic arthropathy, right knee: Secondary | ICD-10-CM | POA: Diagnosis not present

## 2021-07-06 DIAGNOSIS — E1142 Type 2 diabetes mellitus with diabetic polyneuropathy: Secondary | ICD-10-CM | POA: Diagnosis not present

## 2021-07-06 DIAGNOSIS — I129 Hypertensive chronic kidney disease with stage 1 through stage 4 chronic kidney disease, or unspecified chronic kidney disease: Secondary | ICD-10-CM | POA: Diagnosis not present

## 2021-07-06 DIAGNOSIS — I480 Paroxysmal atrial fibrillation: Secondary | ICD-10-CM | POA: Diagnosis not present

## 2021-07-06 DIAGNOSIS — E1122 Type 2 diabetes mellitus with diabetic chronic kidney disease: Secondary | ICD-10-CM | POA: Diagnosis not present

## 2021-07-06 DIAGNOSIS — N184 Chronic kidney disease, stage 4 (severe): Secondary | ICD-10-CM | POA: Diagnosis not present

## 2021-07-09 ENCOUNTER — Other Ambulatory Visit: Payer: Self-pay | Admitting: Internal Medicine

## 2021-07-09 DIAGNOSIS — E785 Hyperlipidemia, unspecified: Secondary | ICD-10-CM

## 2021-07-09 DIAGNOSIS — I6782 Cerebral ischemia: Secondary | ICD-10-CM

## 2021-07-10 ENCOUNTER — Telehealth: Payer: Self-pay | Admitting: Internal Medicine

## 2021-07-10 ENCOUNTER — Other Ambulatory Visit: Payer: Self-pay | Admitting: Internal Medicine

## 2021-07-10 DIAGNOSIS — M12561 Traumatic arthropathy, right knee: Secondary | ICD-10-CM | POA: Diagnosis not present

## 2021-07-10 DIAGNOSIS — N184 Chronic kidney disease, stage 4 (severe): Secondary | ICD-10-CM | POA: Diagnosis not present

## 2021-07-10 DIAGNOSIS — M48062 Spinal stenosis, lumbar region with neurogenic claudication: Secondary | ICD-10-CM

## 2021-07-10 DIAGNOSIS — E1142 Type 2 diabetes mellitus with diabetic polyneuropathy: Secondary | ICD-10-CM

## 2021-07-10 DIAGNOSIS — I480 Paroxysmal atrial fibrillation: Secondary | ICD-10-CM | POA: Diagnosis not present

## 2021-07-10 DIAGNOSIS — I129 Hypertensive chronic kidney disease with stage 1 through stage 4 chronic kidney disease, or unspecified chronic kidney disease: Secondary | ICD-10-CM | POA: Diagnosis not present

## 2021-07-10 DIAGNOSIS — M5416 Radiculopathy, lumbar region: Secondary | ICD-10-CM

## 2021-07-10 DIAGNOSIS — E1122 Type 2 diabetes mellitus with diabetic chronic kidney disease: Secondary | ICD-10-CM | POA: Diagnosis not present

## 2021-07-10 NOTE — Telephone Encounter (Signed)
To PCP please ?

## 2021-07-10 NOTE — Telephone Encounter (Signed)
Patient was seen today by Alvis Lemmings - Patient had a fall on 07/08/2020 in the evening in the kitchen  - Pain in back - no brusing or swelling ?

## 2021-07-13 ENCOUNTER — Telehealth: Payer: Self-pay

## 2021-07-13 NOTE — Telephone Encounter (Signed)
Key: [id number] ? ?Per CoverMyMeds: ?Drug is covered by current benefit plan. No further PA activity needed. ? ?

## 2021-07-16 ENCOUNTER — Emergency Department (HOSPITAL_COMMUNITY): Payer: Medicare Other

## 2021-07-16 ENCOUNTER — Emergency Department (HOSPITAL_COMMUNITY)
Admission: EM | Admit: 2021-07-16 | Discharge: 2021-07-17 | Disposition: A | Discharge status: Home / Self Care | Payer: Medicare Other | Attending: Emergency Medicine | Admitting: Emergency Medicine

## 2021-07-16 ENCOUNTER — Other Ambulatory Visit: Payer: Self-pay | Admitting: Gastroenterology

## 2021-07-16 ENCOUNTER — Other Ambulatory Visit: Payer: Self-pay

## 2021-07-16 ENCOUNTER — Other Ambulatory Visit: Payer: Self-pay | Admitting: Internal Medicine

## 2021-07-16 DIAGNOSIS — R6889 Other general symptoms and signs: Secondary | ICD-10-CM | POA: Diagnosis not present

## 2021-07-16 DIAGNOSIS — S0990XA Unspecified injury of head, initial encounter: Secondary | ICD-10-CM | POA: Diagnosis not present

## 2021-07-16 DIAGNOSIS — N390 Urinary tract infection, site not specified: Secondary | ICD-10-CM | POA: Diagnosis not present

## 2021-07-16 DIAGNOSIS — S22080A Wedge compression fracture of T11-T12 vertebra, initial encounter for closed fracture: Secondary | ICD-10-CM | POA: Diagnosis not present

## 2021-07-16 DIAGNOSIS — S301XXA Contusion of abdominal wall, initial encounter: Secondary | ICD-10-CM | POA: Diagnosis not present

## 2021-07-16 DIAGNOSIS — I2510 Atherosclerotic heart disease of native coronary artery without angina pectoris: Secondary | ICD-10-CM | POA: Diagnosis not present

## 2021-07-16 DIAGNOSIS — Z79899 Other long term (current) drug therapy: Secondary | ICD-10-CM | POA: Diagnosis not present

## 2021-07-16 DIAGNOSIS — J301 Allergic rhinitis due to pollen: Secondary | ICD-10-CM

## 2021-07-16 DIAGNOSIS — K5730 Diverticulosis of large intestine without perforation or abscess without bleeding: Secondary | ICD-10-CM | POA: Diagnosis not present

## 2021-07-16 DIAGNOSIS — G319 Degenerative disease of nervous system, unspecified: Secondary | ICD-10-CM | POA: Diagnosis not present

## 2021-07-16 DIAGNOSIS — R0781 Pleurodynia: Secondary | ICD-10-CM | POA: Diagnosis not present

## 2021-07-16 DIAGNOSIS — D179 Benign lipomatous neoplasm, unspecified: Secondary | ICD-10-CM | POA: Diagnosis not present

## 2021-07-16 DIAGNOSIS — M2578 Osteophyte, vertebrae: Secondary | ICD-10-CM | POA: Diagnosis not present

## 2021-07-16 DIAGNOSIS — W1809XA Striking against other object with subsequent fall, initial encounter: Secondary | ICD-10-CM | POA: Insufficient documentation

## 2021-07-16 DIAGNOSIS — S0003XA Contusion of scalp, initial encounter: Secondary | ICD-10-CM | POA: Diagnosis not present

## 2021-07-16 DIAGNOSIS — I10 Essential (primary) hypertension: Secondary | ICD-10-CM | POA: Diagnosis not present

## 2021-07-16 DIAGNOSIS — S299XXA Unspecified injury of thorax, initial encounter: Secondary | ICD-10-CM | POA: Diagnosis not present

## 2021-07-16 DIAGNOSIS — S199XXA Unspecified injury of neck, initial encounter: Secondary | ICD-10-CM | POA: Diagnosis not present

## 2021-07-16 DIAGNOSIS — I517 Cardiomegaly: Secondary | ICD-10-CM | POA: Diagnosis not present

## 2021-07-16 DIAGNOSIS — R0782 Intercostal pain: Secondary | ICD-10-CM | POA: Insufficient documentation

## 2021-07-16 DIAGNOSIS — I700 Atherosclerosis of aorta: Secondary | ICD-10-CM | POA: Diagnosis not present

## 2021-07-16 DIAGNOSIS — R911 Solitary pulmonary nodule: Secondary | ICD-10-CM | POA: Diagnosis not present

## 2021-07-16 DIAGNOSIS — M419 Scoliosis, unspecified: Secondary | ICD-10-CM | POA: Diagnosis not present

## 2021-07-16 DIAGNOSIS — R404 Transient alteration of awareness: Secondary | ICD-10-CM | POA: Diagnosis not present

## 2021-07-16 DIAGNOSIS — Z743 Need for continuous supervision: Secondary | ICD-10-CM | POA: Diagnosis not present

## 2021-07-16 DIAGNOSIS — S3993XA Unspecified injury of pelvis, initial encounter: Secondary | ICD-10-CM | POA: Diagnosis not present

## 2021-07-16 DIAGNOSIS — Z7901 Long term (current) use of anticoagulants: Secondary | ICD-10-CM | POA: Diagnosis not present

## 2021-07-16 DIAGNOSIS — W19XXXA Unspecified fall, initial encounter: Secondary | ICD-10-CM | POA: Diagnosis not present

## 2021-07-16 DIAGNOSIS — Z20822 Contact with and (suspected) exposure to covid-19: Secondary | ICD-10-CM | POA: Insufficient documentation

## 2021-07-16 DIAGNOSIS — I4891 Unspecified atrial fibrillation: Secondary | ICD-10-CM | POA: Insufficient documentation

## 2021-07-16 DIAGNOSIS — M549 Dorsalgia, unspecified: Secondary | ICD-10-CM | POA: Diagnosis not present

## 2021-07-16 DIAGNOSIS — J323 Chronic sphenoidal sinusitis: Secondary | ICD-10-CM | POA: Diagnosis not present

## 2021-07-16 DIAGNOSIS — L2084 Intrinsic (allergic) eczema: Secondary | ICD-10-CM

## 2021-07-16 LAB — URINALYSIS, ROUTINE W REFLEX MICROSCOPIC
Bilirubin Urine: NEGATIVE
Glucose, UA: NEGATIVE mg/dL
Hgb urine dipstick: NEGATIVE
Ketones, ur: NEGATIVE mg/dL
Nitrite: NEGATIVE
Protein, ur: NEGATIVE mg/dL
Specific Gravity, Urine: 1.01 (ref 1.005–1.030)
WBC, UA: >50 WBC/hpf — ABNORMAL HIGH (ref 0–5)
pH: 7 (ref 5.0–8.0)

## 2021-07-16 LAB — CBC
HCT: 40.3 % (ref 36.0–46.0)
Hemoglobin: 13.3 g/dL (ref 12.0–15.0)
MCH: 32.4 pg (ref 26.0–34.0)
MCHC: 33 g/dL (ref 30.0–36.0)
MCV: 98.3 fL (ref 80.0–100.0)
Platelets: 146 10*3/uL — ABNORMAL LOW (ref 150–400)
RBC: 4.1 MIL/uL (ref 3.87–5.11)
RDW: 13.7 % (ref 11.5–15.5)
WBC: 9.1 10*3/uL (ref 4.0–10.5)
nRBC: 0 % (ref 0.0–0.2)

## 2021-07-16 LAB — ETHANOL: Alcohol, Ethyl (B): <10 mg/dL (ref <10)

## 2021-07-16 LAB — I-STAT CHEM 8, ED
BUN: 23 mg/dL (ref 8–23)
Calcium, Ion: 1.16 mmol/L (ref 1.15–1.40)
Chloride: 106 mmol/L (ref 98–111)
Creatinine, Ser: 1.5 mg/dL — ABNORMAL HIGH (ref 0.44–1.00)
Glucose, Bld: 95 mg/dL (ref 70–99)
HCT: 39 % (ref 36.0–46.0)
Hemoglobin: 13.3 g/dL (ref 12.0–15.0)
Potassium: 4.1 mmol/L (ref 3.5–5.1)
Sodium: 139 mmol/L (ref 135–145)
TCO2: 25 mmol/L (ref 22–32)

## 2021-07-16 LAB — COMPREHENSIVE METABOLIC PANEL
ALT: 13 U/L (ref 0–44)
AST: 14 U/L — ABNORMAL LOW (ref 15–41)
Albumin: 3.4 g/dL — ABNORMAL LOW (ref 3.5–5.0)
Alkaline Phosphatase: 70 U/L (ref 38–126)
Anion gap: 7 (ref 5–15)
BUN: 22 mg/dL (ref 8–23)
CO2: 25 mmol/L (ref 22–32)
Calcium: 9.5 mg/dL (ref 8.9–10.3)
Chloride: 106 mmol/L (ref 98–111)
Creatinine, Ser: 1.48 mg/dL — ABNORMAL HIGH (ref 0.44–1.00)
GFR, Estimated: 34 mL/min — ABNORMAL LOW (ref >60)
Glucose, Bld: 102 mg/dL — ABNORMAL HIGH (ref 70–99)
Potassium: 4.1 mmol/L (ref 3.5–5.1)
Sodium: 138 mmol/L (ref 135–145)
Total Bilirubin: 0.5 mg/dL (ref 0.3–1.2)
Total Protein: 6 g/dL — ABNORMAL LOW (ref 6.5–8.1)

## 2021-07-16 LAB — PROTIME-INR
INR: 1.2 (ref 0.8–1.2)
Prothrombin Time: 15.5 seconds — ABNORMAL HIGH (ref 11.4–15.2)

## 2021-07-16 LAB — RESP PANEL BY RT-PCR (FLU A&B, COVID) ARPGX2
Influenza A by PCR: NEGATIVE
Influenza B by PCR: NEGATIVE
SARS Coronavirus 2 by RT PCR: NEGATIVE

## 2021-07-16 LAB — LACTIC ACID, PLASMA: Lactic Acid, Venous: 1 mmol/L (ref 0.5–1.9)

## 2021-07-16 MED ORDER — DOXYCYCLINE HYCLATE 100 MG PO TABS
100.0000 mg | ORAL_TABLET | Freq: Once | ORAL | Status: AC
  Administered 2021-07-16: 100 mg via ORAL
  Filled 2021-07-16: qty 1

## 2021-07-16 MED ORDER — IOHEXOL 300 MG/ML  SOLN
70.0000 mL | Freq: Once | INTRAMUSCULAR | Status: AC | PRN
  Administered 2021-07-16: 70 mL via INTRAVENOUS

## 2021-07-16 MED ORDER — ONDANSETRON HCL 4 MG/2ML IJ SOLN
4.0000 mg | Freq: Once | INTRAMUSCULAR | Status: AC
  Administered 2021-07-16: 4 mg via INTRAVENOUS
  Filled 2021-07-16: qty 2

## 2021-07-16 MED ORDER — FENTANYL CITRATE PF 50 MCG/ML IJ SOSY
50.0000 ug | PREFILLED_SYRINGE | Freq: Once | INTRAMUSCULAR | Status: AC
  Administered 2021-07-16: 50 ug via INTRAVENOUS
  Filled 2021-07-16: qty 1

## 2021-07-16 MED ORDER — DOXYCYCLINE HYCLATE 100 MG PO CAPS: 100.0000 mg | ORAL_CAPSULE | Freq: Two times a day (BID) | ORAL | 0 refills | Status: DC

## 2021-07-16 MED ORDER — HYDRALAZINE HCL 20 MG/ML IJ SOLN
10.0000 mg | Freq: Once | INTRAMUSCULAR | Status: AC
  Administered 2021-07-16: 10 mg via INTRAVENOUS
  Filled 2021-07-16: qty 1

## 2021-07-16 NOTE — ED Notes (Signed)
Trauma Response Nurse Documentation ? ? ?Penny Miller is a 88 y.o. female arriving to [facility] ED via EMS ? ?On Eliquis (apixaban) daily. Trauma was activated as a Level 2 by ED Charge RN based on the following trauma criteria Elderly patients > 65 with head trauma on anti-coagulation (excluding ASA). Patient cleared for CT by Dr. [name]. Patient to CT with team. GCS 15. ? ?History  ? Past Medical History:  ?Diagnosis Date  ? Allergic rhinitis, cause unspecified   ? Anxiety state, unspecified   ? Arthritis   ? Colon polyp   ? Diaphragmatic hernia without mention of obstruction or gangrene   ? Diverticulosis of colon (without mention of hemorrhage)   ? Female stress incontinence   ? HTN (hypertension)   ? Insomnia, unspecified   ? Interstitial cystitis   ? surgery 02/01/2013  ? Irritable bowel syndrome   ? Memory loss   ? Other and unspecified hyperlipidemia   ? Other premature beats   ? Polyneuropathy in diabetes(357.2)   ? Postmenopausal bleeding   ? Proteinuria   ? Rectal ulcer   ? Skin cancer   ? Stroke [facility]) 2018  ? Type II or unspecified type diabetes mellitus with neurological manifestations, not stated as uncontrolled(250.60)   ? Unspecified essential hypertension   ? Viral warts, unspecified   ?  ? Past Surgical History:  ?Procedure Laterality Date  ? ABDOMINAL HYSTERECTOMY  1977  ? Dr.Hambright  ? APPENDECTOMY  1965  ? CATARACT EXTRACTION, BILATERAL  02/10/2006  ? Dr.Groat   ? COLONOSCOPY  07/17/2004  ? polypectomy  ? CYSTOSCOPY  1987  ? Dr.Bradley  ? LOOP RECORDER INSERTION N/A 07/31/2016  ? Procedure: Loop Recorder Insertion;  Surgeon: [name], MD;  Location: [facility];  Service: Cardiovascular;  Laterality: N/A;  ? TEE WITHOUT CARDIOVERSION N/A 07/30/2016  ? Procedure: TRANSESOPHAGEAL ECHOCARDIOGRAM (TEE);  Surgeon: [name], MD;  Location: [facility];  Service: Cardiovascular;  Laterality: N/A;  ? [place]  ? Dr.Carter  ? TRIGGER FINGER RELEASE  05/13/16  ? urinary  bladder     ? surgery 02/01/2013 to stretch bladder stem  ?  ? ? ?Initial Focused Assessment (If applicable, or please see trauma documentation): ?- GCS 15 ?- PERRLA ?- C-collar in place ?- Hypertensive on arrival ? ?CT's Completed:   ?CT Head, CT C-Spine, CT Chest Miller/ contrast, and CT abdomen/pelvis Miller/ contrast  ? ?Interventions:  ?- CXR  ?- pelvic XR ?- 18G PIV to R [facility] ?- labs drawn ?- Helped pt with bedpan ?- hydralazine given ? ?Plan for disposition:  ?Other Awaiting results and neurosurg ? ?Consults completed:  ?Neurosurgeon at 2134. ? ?Bedside handoff with ED RN Paden.   ? ?Penny Miller  ?Trauma Response RN ? ?Please call TRN at [phone] for further assistance. ? ? ?

## 2021-07-16 NOTE — Progress Notes (Signed)
Orthopedic Tech Progress Note ?Patient Details:  ?Penny Miller ?[birth year] ?[id number] ? ?Ortho Devices ?Type of Ortho Device: Thoracolumbar corset (TLSO) ?Ortho Device/Splint Interventions: Ordered, Application, Adjustment ? After applying the brace I got the patient up and walked her in the hall. She did well. ?Post Interventions ?Patient Tolerated: Well ?Instructions Provided: Care of device, Adjustment of device ? ?Karolee Stamps ?07/16/2021, 11:31 PM ? ?

## 2021-07-16 NOTE — Progress Notes (Signed)
Orthopedic Tech Progress Note ?Patient Details:  ?VALENTINA ALCOSER ?June 23, 1931 ?[id number] ?Level 2 Trauma ?Patient ID: CRISTIANNA CYR, female   DOB: 06-Nov-1931, 88 y.o.   MRN: [id number] ? ?Jenavieve Freda A Ellisha Bankson ?07/16/2021, 7:55 PM ? ?

## 2021-07-16 NOTE — Discharge Instructions (Addendum)
Take doxycycline twice a day for UTI ? ?Please take your blood pressure medicine ? ?You have a compression fracture.  Take Tylenol for pain and take tramadol for severe pain ? ?Please wear your brace during the day and you may be able to remove it when you shower or sleep ? ?You need to follow-up with a neurosurgeon regarding your compression fracture ? ?You also have lung nodule that you can follow-up with your primary care doctor about ? ?Return to ER if you have another fall, worse dizziness, passing out, headache, vomiting ?

## 2021-07-16 NOTE — ED Provider Notes (Addendum)
?[facility] ?Provider Note ? ? ?CSN: [id number] ?Arrival date & time: 07/16/21  1932 ? ?  ? ?History ? ?Chief Complaint  ?Patient presents with  ? Fall  ? ? ?Penny Miller is a 88 y.o. female history of A-fib on Eliquis, heart failure, here presenting with fall.  Patient states that she was bending over and stood up quickly and lost her balance and fell backwards and hit her head and her back.  She states that she has right rib pain and back pain.  She also has some headache as well.  Patient is on Eliquis. Level 2 trauma was activated.  EMS noticed that her blood pressure was in the 220s. ? ?The history is provided by the patient.  ? ?  ? ?Home Medications ?Prior to Admission medications   ?Medication Sig Start Date End Date Taking? Authorizing Provider  ?ACCU-CHEK AVIVA PLUS test strip USE TO TEST BLOOD SUGAR TWICE DAILY. 09/11/18   Reed, [name] L, DO  ?acetaminophen (TYLENOL) 500 MG tablet Take 500 mg by mouth every 6 (six) hours as needed (pain).    [provider]  ?apixaban (ELIQUIS) 2.5 MG TABS tablet TAKE 1 TABLET TWICE A DAY 02/12/21   [name], MD  ?atorvastatin (LIPITOR) 40 MG tablet TAKE 1 TABLET AT BEDTIME 07/09/21   [name], MD  ?citalopram (CELEXA) 10 MG tablet Take 1 tablet (10 mg total) by mouth daily. 02/21/21   [name], MD  ?gabapentin (NEURONTIN) 300 MG capsule TAKE 1 CAPSULE THREE TIMES A DAY 04/12/21   [name], MD  ?hyoscyamine (LEVSIN SL) 0.125 MG SL tablet Place 1 tablet (0.125 mg total) under the tongue every 4 (four) hours as needed. Take tablet every 4-6 hours for abdominal pain and cramping 03/23/20   Levin Erp, PA  ?Iron-Vitamin C 65-125 MG TABS Take 1 tablet by mouth every morning.    [provider]  ?Lancets (ACCU-CHEK MULTICLIX) lancets Use to test blood sugar twice daily. E11.29 07/04/17   Reed, [name] L, DO  ?levocetirizine (XYZAL) 5 MG tablet TAKE 1 TABLET EVERY EVENING 07/16/21    [name], MD  ?NONFORMULARY OR COMPOUNDED ITEM Transport chair   #1    dx leg and hip pain s/p fall 11/10/20   Roma Schanz R, DO  ?NONFORMULARY OR COMPOUNDED ITEM Rollator walker with seat    #1   dx gait abnormality 11/10/20   Carollee Herter, Alferd Apa, DO  ?olmesartan (BENICAR) 40 MG tablet Take 1 tablet (40 mg total) by mouth daily. 05/10/21   Biagio Borg, MD  ?ondansetron (ZOFRAN ODT) 4 MG disintegrating tablet Take 1 tablet (4 mg total) by mouth every 6 (six) hours. ?Patient taking differently: Take 4 mg by mouth as needed. 03/23/20   Levin Erp, PA  ?pioglitazone (ACTOS) 15 MG tablet Take 1 tablet (15 mg total) by mouth daily. 02/13/21   [name], MD  ?traMADol (ULTRAM) 50 MG tablet TAKE 1 TABLET (50 MG TOTAL) BY MOUTH EVERY 6 (SIX) HOURS AS NEEDED. 07/11/21   [name], MD  ?triamcinolone cream (KENALOG) 0.5 % APPLY 1 APPLICATION TOPICALLY TWICE A DAY 02/20/21   [name], MD  ?Vitamin D, Ergocalciferol, (DRISDOL) 1.25 MG (50000 UNIT) CAPS capsule TAKE 1 CAPSULE EVERY 7 DAYS 05/08/21   [name], MD  ?   ? ?Allergies    ?Jardiance [empagliflozin], Metformin and related, Codeine, Metoprolol, Relafen [nabumetone], Septra [  sulfamethoxazole-trimethoprim], Tetracycline, Adhesive [tape], Ceclor [cefaclor], Cefaclor, Codeine, Jardiance [empagliflozin], Lopid [gemfibrozil], Lopid [gemfibrozil], Lopressor [metoprolol tartrate], Metformin and related, Morphine and related, Morphine and related, Niacin, Niacin and related, Niacin and related, Relafen [nabumetone], Septra [sulfamethoxazole-trimethoprim], Tape, Tetracyclines & related, Toprol xl [metoprolol tartrate], and Toprol xl [metoprolol]   ? ?Review of Systems   ?Review of Systems  ?Musculoskeletal:  Positive for back pain.  ?Neurological:  Positive for dizziness and headaches.  ?All other systems reviewed and are negative. ? ?Physical Exam ?Updated Vital Signs ?BP (!) 190/78 (BP Location: Left Arm)   Pulse 62   Temp  97.7 ?F (36.5 ?C) (Oral)   Resp 16   Ht '5\' 7"'$  (1.702 m)   Wt 72.1 kg   SpO2 96%   BMI 24.90 kg/m?  ?Physical Exam ?Vitals and nursing note reviewed.  ?Constitutional:   ?   Comments: Chronically ill  ?HENT:  ?   Head: Normocephalic.  ?   Comments: Small posterior scalp hematoma ?   Nose: Nose normal.  ?   Mouth/Throat:  ?   Mouth: Mucous membranes are moist.  ?Eyes:  ?   Extraocular Movements: Extraocular movements intact.  ?   Pupils: Pupils are equal, round, and reactive to light.  ?Cardiovascular:  ?   Rate and Rhythm: Normal rate and regular rhythm.  ?   Pulses: Normal pulses.  ?   Heart sounds: Normal heart sounds.  ?Pulmonary:  ?   Effort: Pulmonary effort is normal.  ?   Breath sounds: Normal breath sounds.  ?   Comments: R rib tenderness  ?Abdominal:  ?   General: Abdomen is flat.  ?   Palpations: Abdomen is soft.  ?   Comments: Mild bilateral CVAT vs lumbar tenderness   ?Musculoskeletal:     ?   General: Normal range of motion.  ?   Cervical back: Normal range of motion and neck supple.  ?   Comments: Mild paralumbar tenderness.  ?Skin: ?   General: Skin is warm.  ?   Capillary Refill: Capillary refill takes less than 2 seconds.  ?Neurological:  ?   General: No focal deficit present.  ?   Mental Status: She is oriented to person, place, and time.  ?Psychiatric:     ?   Mood and Affect: Mood normal.     ?   Behavior: Behavior normal.  ? ? ?ED Results / Procedures / Treatments   ?Labs ?(all labs ordered are listed, but only abnormal results are displayed) ?Labs Reviewed  ?COMPREHENSIVE METABOLIC PANEL - Abnormal; Notable for the following components:  ?    Result Value  ? Glucose, Bld 102 (*)   ? Creatinine, Ser 1.48 (*)   ? Total Protein 6.0 (*)   ? Albumin 3.4 (*)   ? AST 14 (*)   ? GFR, Estimated 34 (*)   ? All other components within normal limits  ?CBC - Abnormal; Notable for the following components:  ? Platelets 146 (*)   ? All other components within normal limits  ?PROTIME-INR - Abnormal;  Notable for the following components:  ? Prothrombin Time 15.5 (*)   ? All other components within normal limits  ?I-STAT CHEM 8, ED - Abnormal; Notable for the following components:  ? Creatinine, Ser 1.50 (*)   ? All other components within normal limits  ?RESP PANEL BY RT-PCR (FLU A&B, COVID) [id number]  ?ETHANOL  ?LACTIC ACID, PLASMA  ?URINALYSIS, ROUTINE W REFLEX MICROSCOPIC  ? ? ?EKG ?EKG Interpretation ? ?  Date/Time:  Monday July 16 2021 19:41:12 EDT ?Ventricular Rate:  62 ?PR Interval:    ?QRS Duration: 128 ?QT Interval:  453 ?QTC Calculation: 460 ?R Axis:   45 ?Text Interpretation: Atrial flutter with predominant 4:1 AV block LVH with secondary repolarization abnormality Confirmed by Wandra Arthurs [phone]) on 07/16/2021 7:43:34 PM ? ?Radiology ?CT HEAD WO CONTRAST ? ?Result Date: 07/16/2021 ?CLINICAL DATA:  Trauma.  Fall. EXAM: CT HEAD WITHOUT CONTRAST CT CERVICAL SPINE WITHOUT CONTRAST TECHNIQUE: Multidetector CT imaging of the head and cervical spine was performed following the standard protocol without intravenous contrast. Multiplanar CT image reconstructions of the cervical spine were also generated. RADIATION DOSE REDUCTION: This exam was performed according to the departmental dose-optimization program which includes automated exposure control, adjustment of the mA and/or kV according to patient size and/or use of iterative reconstruction technique. COMPARISON:  MRI brain 12/12/2020. CT head and cervical spine 11/20/2020. FINDINGS: CT HEAD FINDINGS Brain: No evidence of acute infarction, hemorrhage, hydrocephalus, extra-axial collection or mass lesion/mass effect. There is moderate periventricular white matter hypodensity, likely chronic small vessel ischemic change. There is mild diffuse atrophy, unchanged. 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 Vascular: No hyperdense vessel or unexpected calcification. Skull: Normal. Negative for fracture or focal lesion. Sinuses/Orbits: There  is an air-fluid level in the right sphenoid sinus. Other: None. CT CERVICAL SPINE FINDINGS Alignment: Normal. Skull base and vertebrae: No acute fracture. No primary bone lesion or focal pathologic process. Soft tissues

## 2021-07-17 ENCOUNTER — Ambulatory Visit: Payer: Medicare Other | Admitting: Internal Medicine

## 2021-07-19 ENCOUNTER — Telehealth: Payer: Self-pay | Admitting: Internal Medicine

## 2021-07-19 NOTE — Telephone Encounter (Signed)
Patient had a fall on Tueday around 6;00 PM - patient was sent to [facility] - Patient had a fracture of C7 - Penny Miller has not seen the patient yet. ?

## 2021-07-23 ENCOUNTER — Encounter: Payer: Self-pay | Admitting: Internal Medicine

## 2021-07-23 ENCOUNTER — Ambulatory Visit (INDEPENDENT_AMBULATORY_CARE_PROVIDER_SITE_OTHER): Payer: Medicare Other | Admitting: Internal Medicine

## 2021-07-23 VITALS — BP 126/72 | HR 47 | Temp 97.8°F | Ht 67.0 in | Wt 157.0 lb

## 2021-07-23 DIAGNOSIS — K5900 Constipation, unspecified: Secondary | ICD-10-CM

## 2021-07-23 DIAGNOSIS — I10 Essential (primary) hypertension: Secondary | ICD-10-CM

## 2021-07-23 DIAGNOSIS — I480 Paroxysmal atrial fibrillation: Secondary | ICD-10-CM

## 2021-07-23 DIAGNOSIS — E1122 Type 2 diabetes mellitus with diabetic chronic kidney disease: Secondary | ICD-10-CM

## 2021-07-23 DIAGNOSIS — I63442 Cerebral infarction due to embolism of left cerebellar artery: Secondary | ICD-10-CM | POA: Diagnosis not present

## 2021-07-23 DIAGNOSIS — N1831 Chronic kidney disease, stage 3a: Secondary | ICD-10-CM | POA: Diagnosis not present

## 2021-07-23 DIAGNOSIS — R911 Solitary pulmonary nodule: Secondary | ICD-10-CM

## 2021-07-23 DIAGNOSIS — S22080A Wedge compression fracture of T11-T12 vertebra, initial encounter for closed fracture: Secondary | ICD-10-CM | POA: Diagnosis not present

## 2021-07-23 MED ORDER — LIDOCAINE 5 % EX PTCH: 1.0000 | MEDICATED_PATCH | CUTANEOUS | 1 refills | Status: DC

## 2021-07-23 NOTE — Patient Instructions (Addendum)
OK to take Magnesium Citrate - 1 bottle now ? ?Ok to increase the miralax to every day ? ?Please take all new medication as prescribed - the lidoderm patch for the pain ? ?Please continue all other medications as before, and refills have been done if requested. ? ?Please have the pharmacy call with any other refills you may need. ? ?Please keep your appointments with your specialists as you may have planned ? ?You will be contacted regarding the referral for: MRI for lower back, Neurosurgury, and Home Health ? ?Please schedule the bone density test before leaving today at the scheduling desk (where you check out) ? ?Please see Dr Ronnald Ramp in 2 months, or sooner if needed ? ? ? ? ? ?

## 2021-07-23 NOTE — Progress Notes (Signed)
Patient ID: Penny Miller, female   DOB: November 20, 1931, 88 y.o.   MRN: [id number] ? ? ? ?    Chief Complaint: follow up back pain after fall ? ?     HPI:  Penny Miller is a 88 y.o. female here with c/o off balance accidental fall backwards and new onset 4/17 mid to lower back pain with radiation to the right side in dermatomal fashion to the mid abdomen without rash; has some mild dysesthesia but o/w burning, mod, constant, nothing makes better or worse except the back pain worse to sit up and stand up.   Seen at ED with noting t12 compression fracture, severe HTN but for some reason daughter and pt seem surprised by the diagnosis.  Was advised for TSLO brace which she did not do, and asked to f/u with neurosurgury.  Tx with doxycycline for UTI.  Daughter is focused on 6 mm pulm nodule today incidentally found on CT.  Post ED pt declines further off balance, fall or other new symptoms, except for less active and new mild constipation issue, no BM for 2 days.  Denies worsening reflux, abd pain, dysphagia, n/v, other bowel change or blood. ?      ?Wt Readings from Last 3 Encounters:  ?07/23/21 157 lb (71.2 kg)  ?07/16/21 159 lb (72.1 kg)  ?07/02/21 158 lb (71.7 kg)  ? ?BP Readings from Last 3 Encounters:  ?07/23/21 126/72  ?07/16/21 (!) 127/91  ?07/02/21 124/74  ? ?      ?Past Medical History:  ?Diagnosis Date  ? Allergic rhinitis, cause unspecified   ? Anxiety state, unspecified   ? Arthritis   ? Colon polyp   ? Diaphragmatic hernia without mention of obstruction or gangrene   ? Diverticulosis of colon (without mention of hemorrhage)   ? Female stress incontinence   ? HTN (hypertension)   ? Insomnia, unspecified   ? Interstitial cystitis   ? surgery 02/01/2013  ? Irritable bowel syndrome   ? Memory loss   ? Other and unspecified hyperlipidemia   ? Other premature beats   ? Polyneuropathy in diabetes(357.2)   ? Postmenopausal bleeding   ? Proteinuria   ? Rectal ulcer   ? Skin cancer   ? Stroke [facility]) 2018  ? Type II or  unspecified type diabetes mellitus with neurological manifestations, not stated as uncontrolled(250.60)   ? Unspecified essential hypertension   ? Viral warts, unspecified   ? ?Past Surgical History:  ?Procedure Laterality Date  ? ABDOMINAL HYSTERECTOMY  1977  ? Dr.Hambright  ? APPENDECTOMY  1965  ? CATARACT EXTRACTION, BILATERAL  02/10/2006  ? Dr.Groat   ? COLONOSCOPY  07/17/2004  ? polypectomy  ? CYSTOSCOPY  1987  ? Dr.Bradley  ? LOOP RECORDER INSERTION N/A 07/31/2016  ? Procedure: Loop Recorder Insertion;  Surgeon: [name], MD;  Location: [place] [facility];  Service: Cardiovascular;  Laterality: N/A;  ? TEE WITHOUT CARDIOVERSION N/A 07/30/2016  ? Procedure: TRANSESOPHAGEAL ECHOCARDIOGRAM (TEE);  Surgeon: [name], MD;  Location: [place];  Service: Cardiovascular;  Laterality: N/A;  ? [place]  ? Dr.Carter  ? TRIGGER FINGER RELEASE  05/13/16  ? urinary bladder     ? surgery 02/01/2013 to stretch bladder stem  ? ? reports that she quit smoking about 10 years ago. Her smoking use included cigarettes. She has a 15.50 pack-year smoking history. She has never used smokeless tobacco. She reports that she does not drink alcohol and  does not use drugs. ?family history includes Bladder Cancer in her father; COPD in her sister; Diabetes in her brother, brother, and sister; Heart attack in her brother and father; Heart disease in her brother; Heart disease (age of onset: 51) in her brother; Hypertension in her brother, sister, and son; Pancreatic cancer (age of onset: 78) in her mother. ?Allergies  ?Allergen Reactions  ? Jardiance [Empagliflozin] Diarrhea  ? Metformin And Related Diarrhea  ? Codeine Nausea And Vomiting  ? Metoprolol Nausea And Vomiting  ? Relafen [Nabumetone] Nausea And Vomiting  ? Septra [Sulfamethoxazole-Trimethoprim] Nausea And Vomiting  ? Tetracycline Nausea And Vomiting  ? Adhesive [Tape] Rash and Other (See Comments)  ?  Tears the skin also  ? Ceclor [Cefaclor] Other  (See Comments)  ?  Unknown reaction; not recalled  ? Cefaclor Other (See Comments)  ?  Unknown reaction  ? Codeine Nausea And Vomiting  ? Jardiance [Empagliflozin] Diarrhea  ? Lopid [Gemfibrozil] Other (See Comments)  ?  Unknown reaction; not recalled  ? Lopid [Gemfibrozil] Other (See Comments)  ?  Unknown reaction  ? Lopressor [Metoprolol Tartrate] Nausea And Vomiting  ? Metformin And Related Diarrhea  ? Morphine And Related Other (See Comments)  ?  Passed out  ? Morphine And Related Other (See Comments)  ?  Passed out  ? Niacin Nausea And Vomiting  ?  Unknown reaction ?Unknown reaction; not recalled  ? Niacin And Related Other (See Comments)  ?  Unknown reaction; not recalled  ? Niacin And Related Other (See Comments)  ?  Unknown reaction  ? Relafen [Nabumetone] Nausea And Vomiting  ? Septra [Sulfamethoxazole-Trimethoprim] Nausea And Vomiting  ? Tape Rash and Other (See Comments)  ?  Tears skin  ? Tetracyclines & Related Nausea And Vomiting  ? Toprol Xl [Metoprolol Tartrate] Nausea And Vomiting  ? Toprol Xl [Metoprolol] Nausea And Vomiting  ? ?Current Outpatient Medications on File Prior to Visit  ?Medication Sig Dispense Refill  ? ACCU-CHEK AVIVA PLUS test strip USE TO TEST BLOOD SUGAR TWICE DAILY. 100 each 11  ? acetaminophen (TYLENOL) 500 MG tablet Take 500 mg by mouth every 6 (six) hours as needed (pain).    ? apixaban (ELIQUIS) 2.5 MG TABS tablet TAKE 1 TABLET TWICE A DAY 180 tablet 1  ? atorvastatin (LIPITOR) 40 MG tablet TAKE 1 TABLET AT BEDTIME 90 tablet 1  ? citalopram (CELEXA) 10 MG tablet Take 1 tablet (10 mg total) by mouth daily. 90 tablet 1  ? gabapentin (NEURONTIN) 300 MG capsule TAKE 1 CAPSULE THREE TIMES A DAY 270 capsule 3  ? hyoscyamine (LEVSIN SL) 0.125 MG SL tablet Place 1 tablet (0.125 mg total) under the tongue every 4 (four) hours as needed. Take tablet every 4-6 hours for abdominal pain and cramping 15 tablet 2  ? Iron-Vitamin C 65-125 MG TABS Take 1 tablet by mouth every morning.    ?  Lancets (ACCU-CHEK MULTICLIX) lancets Use to test blood sugar twice daily. E11.29 100 each 11  ? levocetirizine (XYZAL) 5 MG tablet TAKE 1 TABLET EVERY EVENING 90 tablet 3  ? NONFORMULARY OR COMPOUNDED ITEM Transport chair   #1    dx leg and hip pain s/p fall 1 each 0  ? NONFORMULARY OR COMPOUNDED ITEM Rollator walker with seat    #1   dx gait abnormality 1 each 0  ? olmesartan (BENICAR) 40 MG tablet Take 1 tablet (40 mg total) by mouth daily. 90 tablet 3  ? ondansetron (ZOFRAN ODT) 4 MG  disintegrating tablet Take 1 tablet (4 mg total) by mouth every 6 (six) hours. (Patient taking differently: Take 4 mg by mouth as needed.) 15 tablet 5  ? pioglitazone (ACTOS) 15 MG tablet Take 1 tablet (15 mg total) by mouth daily. 90 tablet 1  ? traMADol (ULTRAM) 50 MG tablet TAKE 1 TABLET (50 MG TOTAL) BY MOUTH EVERY 6 (SIX) HOURS AS NEEDED. 270 tablet 1  ? triamcinolone cream (KENALOG) 0.5 % APPLY 1 APPLICATION TOPICALLY TWICE A DAY 450 g 3  ? Vitamin D, Ergocalciferol, (DRISDOL) 1.25 MG (50000 UNIT) CAPS capsule TAKE 1 CAPSULE EVERY 7 DAYS 12 capsule 0  ? ?No current facility-administered medications on file prior to visit.  ? ?     ROS:  All others reviewed and negative. ? ?Objective  ? ?     PE:  BP 126/72 (BP Location: Right Arm, Patient Position: Sitting, Cuff Size: Normal)   Pulse (!) 47   Temp 97.8 ?F (36.6 ?C) (Oral)   Ht '5\' 7"'$  (1.702 m)   Wt 157 lb (71.2 kg)   SpO2 99%   BMI 24.59 kg/m?  ? ?              Constitutional: Pt appears in NAD ?              HENT: Head: NCAT.  ?              Right Ear: External ear normal.   ?              Left Ear: External ear normal.  ?              Eyes: . Pupils are equal, round, and reactive to light. Conjunctivae and EOM are normal ?              Nose: without d/c or deformity ?              Neck: Neck supple. Gross normal ROM ?              Cardiovascular: Normal rate and regular rhythm.   ?              Pulmonary/Chest: Effort normal and breath sounds without rales or wheezing.   ?              Abd:  Soft, NT, ND, + BS, no organomegaly ?              Neurological: Pt is alert. At baseline orientation, motor grossly intact ?              Skin: Skin is warm. No rashes, no other new lesions

## 2021-07-25 ENCOUNTER — Encounter: Payer: Self-pay | Admitting: Internal Medicine

## 2021-07-25 DIAGNOSIS — K5900 Constipation, unspecified: Secondary | ICD-10-CM | POA: Insufficient documentation

## 2021-07-25 DIAGNOSIS — R911 Solitary pulmonary nodule: Secondary | ICD-10-CM | POA: Insufficient documentation

## 2021-07-25 NOTE — Assessment & Plan Note (Signed)
Stable rate and volume, cont no change in tx - eliquis, does not need further rate control ?

## 2021-07-25 NOTE — Assessment & Plan Note (Signed)
Lab Results  ?Component Value Date  ? HGBA1C 6.7 (H) 05/28/2021  ? ?Stable, pt to continue current medical treatment actos ? ?

## 2021-07-25 NOTE — Assessment & Plan Note (Signed)
BP Readings from Last 3 Encounters:  ?07/23/21 126/72  ?07/16/21 (!) 127/91  ?07/02/21 124/74  ? ?Stable, pt to continue medical treatment benicar ? ?

## 2021-07-25 NOTE — Assessment & Plan Note (Addendum)
Recent onset apr 17, for lidoderm patch, MRI T spine, refer NS, refer [place] with nurse, PT and aide, and DXA r/o osteoporosis ?

## 2021-07-25 NOTE — Assessment & Plan Note (Signed)
Mild likely due to recent event t12 compression fx - for mag citrate x 1 bottle, and miralax daily ?

## 2021-07-25 NOTE — Assessment & Plan Note (Signed)
6 mm incidental noted on recent CT, ok to follow for now, consider f/u CT at 6 - 12 mo ?

## 2021-07-26 ENCOUNTER — Inpatient Hospital Stay: Admission: RE | Admit: 2021-07-26 | Payer: Medicare Other | Source: Ambulatory Visit

## 2021-07-26 ENCOUNTER — Other Ambulatory Visit: Payer: Self-pay

## 2021-07-26 DIAGNOSIS — N184 Chronic kidney disease, stage 4 (severe): Secondary | ICD-10-CM | POA: Diagnosis not present

## 2021-07-26 DIAGNOSIS — M12561 Traumatic arthropathy, right knee: Secondary | ICD-10-CM | POA: Diagnosis not present

## 2021-07-26 DIAGNOSIS — E1142 Type 2 diabetes mellitus with diabetic polyneuropathy: Secondary | ICD-10-CM | POA: Diagnosis not present

## 2021-07-26 DIAGNOSIS — I480 Paroxysmal atrial fibrillation: Secondary | ICD-10-CM | POA: Diagnosis not present

## 2021-07-26 DIAGNOSIS — E1122 Type 2 diabetes mellitus with diabetic chronic kidney disease: Secondary | ICD-10-CM | POA: Diagnosis not present

## 2021-07-26 DIAGNOSIS — I129 Hypertensive chronic kidney disease with stage 1 through stage 4 chronic kidney disease, or unspecified chronic kidney disease: Secondary | ICD-10-CM | POA: Diagnosis not present

## 2021-07-26 NOTE — Patient Outreach (Signed)
[place] [facility]) Care Management ? ?07/26/2021 ? ?Penny Miller ?06/10/1931 ?[id number] ? ? ?Received emmi call with son on phone speaking for pt. Informed of Care management services and went over engagement tool with him. PCP is embedded sent referral over to [facility] for follow up. ? ?Penny Miller ?[place] Management Assistant ?[phone] ? ?

## 2021-07-28 DIAGNOSIS — E7849 Other hyperlipidemia: Secondary | ICD-10-CM | POA: Diagnosis not present

## 2021-07-28 DIAGNOSIS — Z87440 Personal history of urinary (tract) infections: Secondary | ICD-10-CM | POA: Diagnosis not present

## 2021-07-28 DIAGNOSIS — M4854XD Collapsed vertebra, not elsewhere classified, thoracic region, subsequent encounter for fracture with routine healing: Secondary | ICD-10-CM | POA: Diagnosis not present

## 2021-07-28 DIAGNOSIS — N393 Stress incontinence (female) (male): Secondary | ICD-10-CM | POA: Diagnosis not present

## 2021-07-28 DIAGNOSIS — F411 Generalized anxiety disorder: Secondary | ICD-10-CM | POA: Diagnosis not present

## 2021-07-28 DIAGNOSIS — I480 Paroxysmal atrial fibrillation: Secondary | ICD-10-CM | POA: Diagnosis not present

## 2021-07-28 DIAGNOSIS — K449 Diaphragmatic hernia without obstruction or gangrene: Secondary | ICD-10-CM | POA: Diagnosis not present

## 2021-07-28 DIAGNOSIS — Z86010 Personal history of colonic polyps: Secondary | ICD-10-CM | POA: Diagnosis not present

## 2021-07-28 DIAGNOSIS — R911 Solitary pulmonary nodule: Secondary | ICD-10-CM | POA: Diagnosis not present

## 2021-07-28 DIAGNOSIS — Z8673 Personal history of transient ischemic attack (TIA), and cerebral infarction without residual deficits: Secondary | ICD-10-CM | POA: Diagnosis not present

## 2021-07-28 DIAGNOSIS — Z87891 Personal history of nicotine dependence: Secondary | ICD-10-CM | POA: Diagnosis not present

## 2021-07-28 DIAGNOSIS — I129 Hypertensive chronic kidney disease with stage 1 through stage 4 chronic kidney disease, or unspecified chronic kidney disease: Secondary | ICD-10-CM | POA: Diagnosis not present

## 2021-07-28 DIAGNOSIS — Z8619 Personal history of other infectious and parasitic diseases: Secondary | ICD-10-CM | POA: Diagnosis not present

## 2021-07-28 DIAGNOSIS — E1142 Type 2 diabetes mellitus with diabetic polyneuropathy: Secondary | ICD-10-CM | POA: Diagnosis not present

## 2021-07-28 DIAGNOSIS — G4700 Insomnia, unspecified: Secondary | ICD-10-CM | POA: Diagnosis not present

## 2021-07-28 DIAGNOSIS — J309 Allergic rhinitis, unspecified: Secondary | ICD-10-CM | POA: Diagnosis not present

## 2021-07-28 DIAGNOSIS — N184 Chronic kidney disease, stage 4 (severe): Secondary | ICD-10-CM | POA: Diagnosis not present

## 2021-07-28 DIAGNOSIS — W19XXXD Unspecified fall, subsequent encounter: Secondary | ICD-10-CM | POA: Diagnosis not present

## 2021-07-28 DIAGNOSIS — Z7901 Long term (current) use of anticoagulants: Secondary | ICD-10-CM | POA: Diagnosis not present

## 2021-07-28 DIAGNOSIS — R413 Other amnesia: Secondary | ICD-10-CM | POA: Diagnosis not present

## 2021-07-28 DIAGNOSIS — M12561 Traumatic arthropathy, right knee: Secondary | ICD-10-CM | POA: Diagnosis not present

## 2021-07-28 DIAGNOSIS — E1122 Type 2 diabetes mellitus with diabetic chronic kidney disease: Secondary | ICD-10-CM | POA: Diagnosis not present

## 2021-07-28 DIAGNOSIS — Z85828 Personal history of other malignant neoplasm of skin: Secondary | ICD-10-CM | POA: Diagnosis not present

## 2021-07-28 DIAGNOSIS — Z95818 Presence of other cardiac implants and grafts: Secondary | ICD-10-CM | POA: Diagnosis not present

## 2021-07-28 DIAGNOSIS — K581 Irritable bowel syndrome with constipation: Secondary | ICD-10-CM | POA: Diagnosis not present

## 2021-07-31 ENCOUNTER — Other Ambulatory Visit: Payer: Self-pay | Admitting: Internal Medicine

## 2021-07-31 DIAGNOSIS — E559 Vitamin D deficiency, unspecified: Secondary | ICD-10-CM

## 2021-08-02 DIAGNOSIS — Z20822 Contact with and (suspected) exposure to covid-19: Secondary | ICD-10-CM | POA: Diagnosis not present

## 2021-08-07 ENCOUNTER — Ambulatory Visit (INDEPENDENT_AMBULATORY_CARE_PROVIDER_SITE_OTHER): Payer: Medicare Other | Admitting: Family Medicine

## 2021-08-07 ENCOUNTER — Encounter: Payer: Self-pay | Admitting: Family Medicine

## 2021-08-07 ENCOUNTER — Inpatient Hospital Stay: Admission: RE | Admit: 2021-08-07 | Payer: Medicare Other | Source: Ambulatory Visit

## 2021-08-07 VITALS — BP 142/82 | HR 72 | Temp 97.6°F | Ht 67.0 in | Wt 153.4 lb

## 2021-08-07 DIAGNOSIS — K5900 Constipation, unspecified: Secondary | ICD-10-CM

## 2021-08-07 DIAGNOSIS — I63442 Cerebral infarction due to embolism of left cerebellar artery: Secondary | ICD-10-CM

## 2021-08-07 DIAGNOSIS — L282 Other prurigo: Secondary | ICD-10-CM | POA: Diagnosis not present

## 2021-08-07 DIAGNOSIS — R300 Dysuria: Secondary | ICD-10-CM

## 2021-08-07 NOTE — Progress Notes (Signed)
? ?Subjective:  ? ? ? Patient ID: Penny Miller, female    DOB: 1931-11-06, 88 y.o.   MRN: [id number] ? ?Chief Complaint  ?Patient presents with  ? Allergic Reaction  ?  Itching and rash since taking doxycycline for her bladder infection. Stomach swelling started yesterday which is effecting her appetite    ? ? ?Allergic Reaction ? ?Patient is in today for pruritic rash x 2 days that she believes started due to Doxycycline. States she took Doxy x 1 week. Her last dose of Doxycycline was yesterday. States she has noticed improvement in symptoms. Using only Jergens lotion.  ?She is not sure if she is taking Xyzal.  ? ?She was taking Doxy for UTI. Prescribed in the ED. Noted dysuria this morning.  ? ?She also complains of feeling bloated and having small bowel movements. She has chronic constipation.  ?Son reports patient was told to drink Magnesium Citrate.  ?She is also taking a fiber supplement.  ? ?Denies fever, chills, dizziness, chest pain, palpitations, shortness of breath, N/V/D.  ?  ? ? ? ?Health Maintenance Due  ?Topic Date Due  ? OPHTHALMOLOGY EXAM  06/14/2020  ? ? ?Past Medical History:  ?Diagnosis Date  ? Allergic rhinitis, cause unspecified   ? Anxiety state, unspecified   ? Arthritis   ? Colon polyp   ? Diaphragmatic hernia without mention of obstruction or gangrene   ? Diverticulosis of colon (without mention of hemorrhage)   ? Female stress incontinence   ? HTN (hypertension)   ? Insomnia, unspecified   ? Interstitial cystitis   ? surgery 02/01/2013  ? Irritable bowel syndrome   ? Memory loss   ? Other and unspecified hyperlipidemia   ? Other premature beats   ? Polyneuropathy in diabetes(357.2)   ? Postmenopausal bleeding   ? Proteinuria   ? Rectal ulcer   ? Skin cancer   ? Stroke [facility]) 2018  ? Type II or unspecified type diabetes mellitus with neurological manifestations, not stated as uncontrolled(250.60)   ? Unspecified essential hypertension   ? Viral warts, unspecified   ? ? ?Past Surgical  History:  ?Procedure Laterality Date  ? ABDOMINAL HYSTERECTOMY  1977  ? Dr.Hambright  ? APPENDECTOMY  1965  ? CATARACT EXTRACTION, BILATERAL  02/10/2006  ? Dr.Groat   ? COLONOSCOPY  07/17/2004  ? polypectomy  ? CYSTOSCOPY  1987  ? Dr.Bradley  ? LOOP RECORDER INSERTION N/A 07/31/2016  ? Procedure: Loop Recorder Insertion;  Surgeon: [name], MD;  Location: [place] [facility];  Service: Cardiovascular;  Laterality: N/A;  ? TEE WITHOUT CARDIOVERSION N/A 07/30/2016  ? Procedure: TRANSESOPHAGEAL ECHOCARDIOGRAM (TEE);  Surgeon: [name], MD;  Location: [place];  Service: Cardiovascular;  Laterality: N/A;  ? [place]  ? Dr.Carter  ? TRIGGER FINGER RELEASE  05/13/16  ? urinary bladder     ? surgery 02/01/2013 to stretch bladder stem  ? ? ?Family History  ?Problem Relation Age of Onset  ? Pancreatic cancer Mother 40  ? Heart attack Father   ? Bladder Cancer Father   ? Hypertension Sister   ? Diabetes Sister   ? COPD Sister   ? Hypertension Son   ? Diabetes Brother   ? Heart disease Brother   ? Hypertension Brother   ? Diabetes Brother   ? Heart disease Brother 62  ?     Myocardial Infarction   ? Heart attack Brother   ? ? ?Social History  ? ?  Socioeconomic History  ? Marital status: Widowed  ?  Spouse name: Not on file  ? Number of children: 1  ? Years of education: Not on file  ? Highest education level: Not on file  ?Occupational History  ? Occupation: Pharmacist, hospital retired  ?  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 Smoking status: Former  ?  Packs/day: 0.25  ?  Years: 62.00  ?  Pack years: 15.50  ?  Types: Cigarettes  ?  Quit date: 04/02/2011  ?  Years since quitting: 10.3  ? Smokeless tobacco: Never  ? Tobacco comments:  ?  stopped consistently smoking in 2012 but still smokes occasionally (one pack per week)  ?Vaping Use  ? Vaping Use: Never used  ?Substance and Sexual Activity  ? Alcohol use: No  ? Drug use: No  ? Sexual activity: Not Currently  ?Other Topics Concern  ? Not on file  ?Social  History Narrative  ? Ophthalmologist-Dr.Gould and Dr.Groat  ? Podiatrist- Dr.Tuchman  ? Dermatologist- Dr.Lupton  ? Urologist- Dr. Lawerance Bach   ? Lives with her son.   ? ?Social Determinants of Health  ? ?Financial Resource Strain: Low Risk   ? Difficulty of Paying Living Expenses: Not hard at all  ?Food Insecurity: No Food Insecurity  ? Worried About Charity fundraiser in the Last Year: Never true  ? Ran Out of Food in the Last Year: Never true  ?Transportation Needs: No Transportation Needs  ? Lack of Transportation (Medical): No  ? Lack of Transportation (Non-Medical): No  ?Physical Activity: Inactive  ? Days of Exercise per Week: 0 days  ? Minutes of Exercise per Session: 0 min  ?Stress: No Stress Concern Present  ? Feeling of Stress : Not at all  ?Social Connections: Moderately Integrated  ? Frequency of Communication with Friends and Family: More than three times a week  ? Frequency of Social Gatherings with Friends and Family: Three times a week  ? Attends Religious Services: More than 4 times per year  ? Active Member of Clubs or Organizations: No  ? Attends Archivist Meetings: More than 4 times per year  ? Marital Status: Widowed  ?Intimate Partner Violence: Not At Risk  ? Fear of Current or Ex-Partner: No  ? Emotionally Abused: No  ? Physically Abused: No  ? Sexually Abused: No  ? ? ?Outpatient Medications Prior to Visit  ?Medication Sig Dispense Refill  ? ACCU-CHEK AVIVA PLUS test strip USE TO TEST BLOOD SUGAR TWICE DAILY. 100 each 11  ? acetaminophen (TYLENOL) 500 MG tablet Take 500 mg by mouth every 6 (six) hours as needed (pain).    ? apixaban (ELIQUIS) 2.5 MG TABS tablet TAKE 1 TABLET TWICE A DAY 180 tablet 1  ? atorvastatin (LIPITOR) 40 MG tablet TAKE 1 TABLET AT BEDTIME 90 tablet 1  ? citalopram (CELEXA) 10 MG tablet Take 1 tablet (10 mg total) by mouth daily. 90 tablet 1  ? gabapentin (NEURONTIN) 300 MG capsule TAKE 1 CAPSULE THREE TIMES A DAY 270 capsule 3  ? hyoscyamine (LEVSIN SL)  0.125 MG SL tablet Place 1 tablet (0.125 mg total) under the tongue every 4 (four) hours as needed. Take tablet every 4-6 hours for abdominal pain and cramping 15 tablet 2  ? Iron-Vitamin C 65-125 MG TABS Take 1 tablet by mouth every morning.    ? Lancets (ACCU-CHEK MULTICLIX) lancets Use to test blood sugar twice daily. E11.29 100 each 11  ? levocetirizine (XYZAL)  5 MG tablet TAKE 1 TABLET EVERY EVENING 90 tablet 3  ? lidocaine (LIDODERM) 5 % Place 1 patch onto the skin daily. Remove & Discard patch within 12 hours or as directed by MD 60 patch 1  ? NONFORMULARY OR COMPOUNDED ITEM Transport chair   #1    dx leg and hip pain s/p fall 1 each 0  ? NONFORMULARY OR COMPOUNDED ITEM Rollator walker with seat    #1   dx gait abnormality 1 each 0  ? olmesartan (BENICAR) 40 MG tablet Take 1 tablet (40 mg total) by mouth daily. 90 tablet 3  ? ondansetron (ZOFRAN ODT) 4 MG disintegrating tablet Take 1 tablet (4 mg total) by mouth every 6 (six) hours. (Patient taking differently: Take 4 mg by mouth as needed.) 15 tablet 5  ? pioglitazone (ACTOS) 15 MG tablet Take 1 tablet (15 mg total) by mouth daily. 90 tablet 1  ? traMADol (ULTRAM) 50 MG tablet TAKE 1 TABLET (50 MG TOTAL) BY MOUTH EVERY 6 (SIX) HOURS AS NEEDED. 270 tablet 1  ? triamcinolone cream (KENALOG) 0.5 % APPLY 1 APPLICATION TOPICALLY TWICE A DAY 450 g 3  ? Vitamin D, Ergocalciferol, (DRISDOL) 1.25 MG (50000 UNIT) CAPS capsule TAKE 1 CAPSULE EVERY 7 DAYS 12 capsule 0  ? doxycycline (VIBRAMYCIN) 100 MG capsule Take 100 mg by mouth 2 (two) times daily. (Patient not taking: Reported on 08/07/2021)    ? ?No facility-administered medications prior to visit.  ? ? ?Allergies  ?Allergen Reactions  ? Jardiance [Empagliflozin] Diarrhea  ? Metformin And Related Diarrhea  ? Codeine Nausea And Vomiting  ? Metoprolol Nausea And Vomiting  ? Relafen [Nabumetone] Nausea And Vomiting  ? Septra [Sulfamethoxazole-Trimethoprim] Nausea And Vomiting  ? Tetracycline Nausea And Vomiting  ?  Adhesive [Tape] Rash and Other (See Comments)  ?  Tears the skin also  ? Ceclor [Cefaclor] Other (See Comments)  ?  Unknown reaction; not recalled  ? Cefaclor Other (See Comments)  ?  Unknown reaction  ? Codeine Nausea

## 2021-08-07 NOTE — Assessment & Plan Note (Signed)
She took one week of Doxycycline prescribed by ED physician for acute cystitis. Noted some improvement but dysuria this morning. She does have chronic interstitial cystitis.  ?Unable to provide a urine specimen and she tried x 2. First attempt did not make it into the specimen cup. She may return for a UA if symptoms persist.  ?

## 2021-08-07 NOTE — Assessment & Plan Note (Signed)
She has not been taking Miralax as recommended. I reiterated this today. No distress or sign of blockage.  ?

## 2021-08-07 NOTE — Assessment & Plan Note (Signed)
Last dose of Doxycycline was yesterday and she reports some improvement. Discussed that her rash is not diffuse and I am unable to say if rash is due to Doxycycline. She will hold off on taking this for now.  Advised patient and her son to make sure she is taking Xyzal which is on her medication list. She may take Benadryl if needed. Use cool compresses. Follow up if worsening.  ?

## 2021-08-07 NOTE — Patient Instructions (Signed)
STOP the Doxycycline antibiotic.  ? ?You may try over the counter Hydrocortisone topical.  ? ?Make sure you are taking your allergy medication called levocetirizine (Xyzal).  ? ?Use cool compresses to the areas that are itching. ? ?If you are having significant itching and trouble sleeping then you may take an over-the-counter Benadryl but be aware this medication is sedating. ? ? ? ?For your constipation issues, you may add a capful of MiraLAX to a 6 or 8 ounce glass of water daily.  Follow-up with your primary care provider about the constipation or your gastroenterologist if needed. ? ?

## 2021-08-08 ENCOUNTER — Telehealth: Payer: Self-pay | Admitting: Internal Medicine

## 2021-08-08 ENCOUNTER — Other Ambulatory Visit: Payer: Self-pay | Admitting: Family Medicine

## 2021-08-08 DIAGNOSIS — R300 Dysuria: Secondary | ICD-10-CM | POA: Diagnosis not present

## 2021-08-08 NOTE — Progress Notes (Signed)
?  Chronic Care Management  ? ?Outreach Note ? ?08/08/2021 ?Name: Penny Miller MRN: [id number] DOB: 09/15/1931 ? ?Referred by: [name], MD ?Reason for referral : No chief complaint on file. ? ? ?An unsuccessful telephone outreach was attempted today. The patient was referred to the pharmacist for assistance with care management and care coordination.  ? ?Follow Up Plan: referral msg ? ?Tatjana Dellinger ?[facility] Scheduler  ?

## 2021-08-08 NOTE — Progress Notes (Signed)
Pt dropped off urine today, in need of orders from yesterday's visit for dysuria ?

## 2021-08-08 NOTE — Addendum Note (Signed)
Addended by: Rossie Muskrat on: 08/08/2021 04:20 PM ? ? Modules accepted: Orders ? ?

## 2021-08-09 LAB — URINALYSIS, ROUTINE W REFLEX MICROSCOPIC
Bilirubin Urine: NEGATIVE
Hgb urine dipstick: NEGATIVE
Ketones, ur: NEGATIVE
Leukocytes,Ua: NEGATIVE
Nitrite: NEGATIVE
RBC / HPF: NONE SEEN (ref 0–?)
Specific Gravity, Urine: 1.015 (ref 1.000–1.030)
Urine Glucose: NEGATIVE
Urobilinogen, UA: 0.2 (ref 0.0–1.0)
pH: 6 (ref 5.0–8.0)

## 2021-08-09 LAB — URINE CULTURE

## 2021-08-13 ENCOUNTER — Other Ambulatory Visit: Payer: Self-pay | Admitting: Internal Medicine

## 2021-08-13 ENCOUNTER — Telehealth: Payer: Self-pay

## 2021-08-13 DIAGNOSIS — E1122 Type 2 diabetes mellitus with diabetic chronic kidney disease: Secondary | ICD-10-CM

## 2021-08-13 NOTE — Telephone Encounter (Signed)
Pt is asking for a partial fill on her gabapentin (NEURONTIN) 300 MG capsule [facility] to [facility] not able to send out refill in time as she only has 1 pill left. ?

## 2021-08-14 ENCOUNTER — Other Ambulatory Visit: Payer: Self-pay | Admitting: Internal Medicine

## 2021-08-14 DIAGNOSIS — M5416 Radiculopathy, lumbar region: Secondary | ICD-10-CM

## 2021-08-14 DIAGNOSIS — E1142 Type 2 diabetes mellitus with diabetic polyneuropathy: Secondary | ICD-10-CM

## 2021-08-14 MED ORDER — GABAPENTIN 300 MG PO CAPS: 300.0000 mg | ORAL_CAPSULE | Freq: Three times a day (TID) | ORAL | 1 refills | Status: DC

## 2021-08-14 NOTE — Telephone Encounter (Signed)
Please send refill request to  ?[place], [place] Phone:  [phone]  ?Fax:  [phone]  ?  ? ?

## 2021-08-15 DIAGNOSIS — I129 Hypertensive chronic kidney disease with stage 1 through stage 4 chronic kidney disease, or unspecified chronic kidney disease: Secondary | ICD-10-CM | POA: Diagnosis not present

## 2021-08-15 DIAGNOSIS — M12561 Traumatic arthropathy, right knee: Secondary | ICD-10-CM | POA: Diagnosis not present

## 2021-08-15 DIAGNOSIS — E1122 Type 2 diabetes mellitus with diabetic chronic kidney disease: Secondary | ICD-10-CM | POA: Diagnosis not present

## 2021-08-15 DIAGNOSIS — N184 Chronic kidney disease, stage 4 (severe): Secondary | ICD-10-CM | POA: Diagnosis not present

## 2021-08-15 DIAGNOSIS — M4854XD Collapsed vertebra, not elsewhere classified, thoracic region, subsequent encounter for fracture with routine healing: Secondary | ICD-10-CM | POA: Diagnosis not present

## 2021-08-15 DIAGNOSIS — E1142 Type 2 diabetes mellitus with diabetic polyneuropathy: Secondary | ICD-10-CM | POA: Diagnosis not present

## 2021-08-16 ENCOUNTER — Other Ambulatory Visit: Payer: Medicare Other

## 2021-08-20 ENCOUNTER — Other Ambulatory Visit: Payer: Self-pay | Admitting: Internal Medicine

## 2021-08-20 DIAGNOSIS — F325 Major depressive disorder, single episode, in full remission: Secondary | ICD-10-CM

## 2021-08-22 DIAGNOSIS — I129 Hypertensive chronic kidney disease with stage 1 through stage 4 chronic kidney disease, or unspecified chronic kidney disease: Secondary | ICD-10-CM | POA: Diagnosis not present

## 2021-08-22 DIAGNOSIS — E1122 Type 2 diabetes mellitus with diabetic chronic kidney disease: Secondary | ICD-10-CM | POA: Diagnosis not present

## 2021-08-22 DIAGNOSIS — N184 Chronic kidney disease, stage 4 (severe): Secondary | ICD-10-CM | POA: Diagnosis not present

## 2021-08-22 DIAGNOSIS — M12561 Traumatic arthropathy, right knee: Secondary | ICD-10-CM | POA: Diagnosis not present

## 2021-08-22 DIAGNOSIS — E1142 Type 2 diabetes mellitus with diabetic polyneuropathy: Secondary | ICD-10-CM | POA: Diagnosis not present

## 2021-08-22 DIAGNOSIS — M4854XD Collapsed vertebra, not elsewhere classified, thoracic region, subsequent encounter for fracture with routine healing: Secondary | ICD-10-CM | POA: Diagnosis not present

## 2021-08-27 DIAGNOSIS — R911 Solitary pulmonary nodule: Secondary | ICD-10-CM | POA: Diagnosis not present

## 2021-08-27 DIAGNOSIS — Z7901 Long term (current) use of anticoagulants: Secondary | ICD-10-CM | POA: Diagnosis not present

## 2021-08-27 DIAGNOSIS — G4700 Insomnia, unspecified: Secondary | ICD-10-CM | POA: Diagnosis not present

## 2021-08-27 DIAGNOSIS — Z8673 Personal history of transient ischemic attack (TIA), and cerebral infarction without residual deficits: Secondary | ICD-10-CM | POA: Diagnosis not present

## 2021-08-27 DIAGNOSIS — E1142 Type 2 diabetes mellitus with diabetic polyneuropathy: Secondary | ICD-10-CM | POA: Diagnosis not present

## 2021-08-27 DIAGNOSIS — N184 Chronic kidney disease, stage 4 (severe): Secondary | ICD-10-CM | POA: Diagnosis not present

## 2021-08-27 DIAGNOSIS — I480 Paroxysmal atrial fibrillation: Secondary | ICD-10-CM | POA: Diagnosis not present

## 2021-08-27 DIAGNOSIS — M4854XD Collapsed vertebra, not elsewhere classified, thoracic region, subsequent encounter for fracture with routine healing: Secondary | ICD-10-CM | POA: Diagnosis not present

## 2021-08-27 DIAGNOSIS — Z87440 Personal history of urinary (tract) infections: Secondary | ICD-10-CM | POA: Diagnosis not present

## 2021-08-27 DIAGNOSIS — F411 Generalized anxiety disorder: Secondary | ICD-10-CM | POA: Diagnosis not present

## 2021-08-27 DIAGNOSIS — Z95818 Presence of other cardiac implants and grafts: Secondary | ICD-10-CM | POA: Diagnosis not present

## 2021-08-27 DIAGNOSIS — I129 Hypertensive chronic kidney disease with stage 1 through stage 4 chronic kidney disease, or unspecified chronic kidney disease: Secondary | ICD-10-CM | POA: Diagnosis not present

## 2021-08-27 DIAGNOSIS — N393 Stress incontinence (female) (male): Secondary | ICD-10-CM | POA: Diagnosis not present

## 2021-08-27 DIAGNOSIS — R413 Other amnesia: Secondary | ICD-10-CM | POA: Diagnosis not present

## 2021-08-27 DIAGNOSIS — Z8619 Personal history of other infectious and parasitic diseases: Secondary | ICD-10-CM | POA: Diagnosis not present

## 2021-08-27 DIAGNOSIS — Z86010 Personal history of colonic polyps: Secondary | ICD-10-CM | POA: Diagnosis not present

## 2021-08-27 DIAGNOSIS — E1122 Type 2 diabetes mellitus with diabetic chronic kidney disease: Secondary | ICD-10-CM | POA: Diagnosis not present

## 2021-08-27 DIAGNOSIS — K449 Diaphragmatic hernia without obstruction or gangrene: Secondary | ICD-10-CM | POA: Diagnosis not present

## 2021-08-27 DIAGNOSIS — W19XXXD Unspecified fall, subsequent encounter: Secondary | ICD-10-CM | POA: Diagnosis not present

## 2021-08-27 DIAGNOSIS — Z85828 Personal history of other malignant neoplasm of skin: Secondary | ICD-10-CM | POA: Diagnosis not present

## 2021-08-27 DIAGNOSIS — E7849 Other hyperlipidemia: Secondary | ICD-10-CM | POA: Diagnosis not present

## 2021-08-27 DIAGNOSIS — M12561 Traumatic arthropathy, right knee: Secondary | ICD-10-CM | POA: Diagnosis not present

## 2021-08-27 DIAGNOSIS — K581 Irritable bowel syndrome with constipation: Secondary | ICD-10-CM | POA: Diagnosis not present

## 2021-08-27 DIAGNOSIS — Z87891 Personal history of nicotine dependence: Secondary | ICD-10-CM | POA: Diagnosis not present

## 2021-08-27 DIAGNOSIS — J309 Allergic rhinitis, unspecified: Secondary | ICD-10-CM | POA: Diagnosis not present

## 2021-08-28 DIAGNOSIS — I129 Hypertensive chronic kidney disease with stage 1 through stage 4 chronic kidney disease, or unspecified chronic kidney disease: Secondary | ICD-10-CM | POA: Diagnosis not present

## 2021-08-28 DIAGNOSIS — N184 Chronic kidney disease, stage 4 (severe): Secondary | ICD-10-CM | POA: Diagnosis not present

## 2021-08-28 DIAGNOSIS — M12561 Traumatic arthropathy, right knee: Secondary | ICD-10-CM | POA: Diagnosis not present

## 2021-08-28 DIAGNOSIS — M4854XD Collapsed vertebra, not elsewhere classified, thoracic region, subsequent encounter for fracture with routine healing: Secondary | ICD-10-CM | POA: Diagnosis not present

## 2021-08-28 DIAGNOSIS — E1142 Type 2 diabetes mellitus with diabetic polyneuropathy: Secondary | ICD-10-CM | POA: Diagnosis not present

## 2021-08-28 DIAGNOSIS — E1122 Type 2 diabetes mellitus with diabetic chronic kidney disease: Secondary | ICD-10-CM | POA: Diagnosis not present

## 2021-08-29 DIAGNOSIS — M48062 Spinal stenosis, lumbar region with neurogenic claudication: Secondary | ICD-10-CM | POA: Diagnosis not present

## 2021-08-29 DIAGNOSIS — Z6825 Body mass index (BMI) 25.0-25.9, adult: Secondary | ICD-10-CM | POA: Diagnosis not present

## 2021-09-04 ENCOUNTER — Ambulatory Visit: Payer: Medicare Other

## 2021-09-04 ENCOUNTER — Telehealth: Payer: Self-pay

## 2021-09-04 NOTE — Telephone Encounter (Signed)
No return call 

## 2021-09-06 DIAGNOSIS — I129 Hypertensive chronic kidney disease with stage 1 through stage 4 chronic kidney disease, or unspecified chronic kidney disease: Secondary | ICD-10-CM | POA: Diagnosis not present

## 2021-09-06 DIAGNOSIS — N2581 Secondary hyperparathyroidism of renal origin: Secondary | ICD-10-CM | POA: Diagnosis not present

## 2021-09-06 DIAGNOSIS — W19XXXA Unspecified fall, initial encounter: Secondary | ICD-10-CM | POA: Diagnosis not present

## 2021-09-06 DIAGNOSIS — N390 Urinary tract infection, site not specified: Secondary | ICD-10-CM | POA: Diagnosis not present

## 2021-09-06 DIAGNOSIS — R809 Proteinuria, unspecified: Secondary | ICD-10-CM | POA: Diagnosis not present

## 2021-09-06 DIAGNOSIS — D1771 Benign lipomatous neoplasm of kidney: Secondary | ICD-10-CM | POA: Diagnosis not present

## 2021-09-06 DIAGNOSIS — N1832 Chronic kidney disease, stage 3b: Secondary | ICD-10-CM | POA: Diagnosis not present

## 2021-09-06 DIAGNOSIS — D509 Iron deficiency anemia, unspecified: Secondary | ICD-10-CM | POA: Diagnosis not present

## 2021-09-06 DIAGNOSIS — E1122 Type 2 diabetes mellitus with diabetic chronic kidney disease: Secondary | ICD-10-CM | POA: Diagnosis not present

## 2021-09-17 ENCOUNTER — Telehealth: Payer: Self-pay

## 2021-09-17 NOTE — Telephone Encounter (Signed)
Patient's son [name] left a voicemail requesting a call back. Palliative Care referral was received for patient on 06/07/21. [name] declined services on 06/15/21. Left a message to return call.

## 2021-09-21 DIAGNOSIS — N1832 Chronic kidney disease, stage 3b: Secondary | ICD-10-CM | POA: Diagnosis not present

## 2021-09-24 ENCOUNTER — Ambulatory Visit (INDEPENDENT_AMBULATORY_CARE_PROVIDER_SITE_OTHER): Payer: Medicare Other | Admitting: Internal Medicine

## 2021-09-24 ENCOUNTER — Encounter: Payer: Self-pay | Admitting: Internal Medicine

## 2021-09-24 VITALS — BP 138/68 | HR 62 | Temp 98.0°F | Resp 16 | Ht 67.0 in | Wt 150.0 lb

## 2021-09-24 DIAGNOSIS — J4540 Moderate persistent asthma, uncomplicated: Secondary | ICD-10-CM | POA: Diagnosis not present

## 2021-09-24 DIAGNOSIS — I10 Essential (primary) hypertension: Secondary | ICD-10-CM | POA: Diagnosis not present

## 2021-09-24 DIAGNOSIS — J22 Unspecified acute lower respiratory infection: Secondary | ICD-10-CM | POA: Insufficient documentation

## 2021-09-24 DIAGNOSIS — E1122 Type 2 diabetes mellitus with diabetic chronic kidney disease: Secondary | ICD-10-CM

## 2021-09-24 DIAGNOSIS — I63442 Cerebral infarction due to embolism of left cerebellar artery: Secondary | ICD-10-CM | POA: Diagnosis not present

## 2021-09-24 DIAGNOSIS — N1831 Chronic kidney disease, stage 3a: Secondary | ICD-10-CM

## 2021-09-24 MED ORDER — AZITHROMYCIN 500 MG PO TABS: 500.0000 mg | ORAL_TABLET | Freq: Every day | ORAL | 0 refills | Status: AC

## 2021-09-25 DIAGNOSIS — J4540 Moderate persistent asthma, uncomplicated: Secondary | ICD-10-CM | POA: Insufficient documentation

## 2021-09-25 MED ORDER — IPRATROPIUM-ALBUTEROL 0.5-2.5 (3) MG/3ML IN SOLN: 3.0000 mL | Freq: Four times a day (QID) | RESPIRATORY_TRACT | 2 refills | Status: DC | PRN

## 2021-09-25 MED ORDER — BUDESONIDE 0.5 MG/2ML IN SUSP: 0.5000 mg | Freq: Two times a day (BID) | RESPIRATORY_TRACT | 5 refills | Status: DC

## 2021-09-27 ENCOUNTER — Other Ambulatory Visit: Payer: Self-pay | Admitting: Internal Medicine

## 2021-09-27 DIAGNOSIS — I129 Hypertensive chronic kidney disease with stage 1 through stage 4 chronic kidney disease, or unspecified chronic kidney disease: Secondary | ICD-10-CM | POA: Diagnosis not present

## 2021-09-27 DIAGNOSIS — J4540 Moderate persistent asthma, uncomplicated: Secondary | ICD-10-CM

## 2021-09-27 MED ORDER — NEBULIZER MISC: 1.0000 | Freq: Every day | 2 refills | Status: DC

## 2021-09-27 MED ORDER — IPRATROPIUM-ALBUTEROL 0.5-2.5 (3) MG/3ML IN SOLN: 3.0000 mL | Freq: Four times a day (QID) | RESPIRATORY_TRACT | 2 refills | Status: DC | PRN

## 2021-09-28 ENCOUNTER — Telehealth: Payer: Self-pay | Admitting: Cardiology

## 2021-09-28 ENCOUNTER — Other Ambulatory Visit (HOSPITAL_COMMUNITY): Payer: Self-pay | Admitting: Internal Medicine

## 2021-09-28 ENCOUNTER — Other Ambulatory Visit: Payer: Self-pay | Admitting: Internal Medicine

## 2021-09-28 DIAGNOSIS — I480 Paroxysmal atrial fibrillation: Secondary | ICD-10-CM

## 2021-09-28 DIAGNOSIS — J4540 Moderate persistent asthma, uncomplicated: Secondary | ICD-10-CM

## 2021-09-28 MED ORDER — IPRATROPIUM-ALBUTEROL 0.5-2.5 (3) MG/3ML IN SOLN: 3.0000 mL | Freq: Four times a day (QID) | RESPIRATORY_TRACT | 2 refills | Status: DC | PRN

## 2021-09-28 MED ORDER — APIXABAN 2.5 MG PO TABS: 2.5000 mg | ORAL_TABLET | Freq: Two times a day (BID) | ORAL | 0 refills | Status: DC

## 2021-09-28 NOTE — Telephone Encounter (Signed)
Prescription refill request for Eliquis received. Indication:Afib Last office visit:3/23 Scr:1.5 Age: 88 Weight:68 kg  Prescription refilled

## 2021-09-28 NOTE — Telephone Encounter (Signed)
*  STAT* If patient is at the pharmacy, call can be transferred to refill team.   1. Which medications need to be refilled? (please list name of each medication and dose if known) ELIQUIS 2.5 MG TABS tablet  2. Which pharmacy/location (including street and city if local pharmacy) is medication to be sent to? [place], [place]  3. Do they need a 30 day or 90 day supply? 90  [facility], however pt's son states that pt is completely out and it needs to be sent to pharmacy above.

## 2021-09-28 NOTE — Telephone Encounter (Signed)
Eliquis 2.'5mg'$  refill request received. Patient is 88 years old, weight-68kg, Crea-1.50 on 07/16/2021, Diagnosis-Afib, and last seen by Tommye Standard on 06/05/2021. Dose is appropriate based on dosing criteria. Will send in refill to requested pharmacy.    Pt/son requesting 90 day supply to local pharmacy.

## 2021-10-03 ENCOUNTER — Other Ambulatory Visit: Payer: Self-pay | Admitting: Internal Medicine

## 2021-10-03 DIAGNOSIS — J4540 Moderate persistent asthma, uncomplicated: Secondary | ICD-10-CM

## 2021-10-03 MED ORDER — NEBULIZER MISC: 1.0000 | Freq: Every day | 2 refills | Status: DC

## 2021-10-05 ENCOUNTER — Ambulatory Visit: Payer: Medicare Other | Admitting: Podiatry

## 2021-10-09 ENCOUNTER — Ambulatory Visit: Payer: Medicare Other | Admitting: Podiatry

## 2021-10-09 ENCOUNTER — Other Ambulatory Visit: Payer: Self-pay | Admitting: Internal Medicine

## 2021-10-15 DIAGNOSIS — I129 Hypertensive chronic kidney disease with stage 1 through stage 4 chronic kidney disease, or unspecified chronic kidney disease: Secondary | ICD-10-CM | POA: Diagnosis not present

## 2021-10-23 ENCOUNTER — Other Ambulatory Visit: Payer: Self-pay | Admitting: Internal Medicine

## 2021-10-23 DIAGNOSIS — E559 Vitamin D deficiency, unspecified: Secondary | ICD-10-CM

## 2021-11-05 ENCOUNTER — Ambulatory Visit (INDEPENDENT_AMBULATORY_CARE_PROVIDER_SITE_OTHER): Payer: Medicare Other

## 2021-11-05 DIAGNOSIS — Z0000 Encounter for general adult medical examination without abnormal findings: Secondary | ICD-10-CM | POA: Diagnosis not present

## 2021-11-05 NOTE — Patient Instructions (Signed)

## 2021-11-05 NOTE — Progress Notes (Signed)
Subjective:   Penny Miller is a 88 y.o. female who presents for Medicare Annual (Subsequent) preventive examination.  Review of Systems    Defer to PCP  I connected with  [name] on 11/05/21 by a audio enabled telemedicine application and verified that I am speaking with the correct person using two identifiers.  Patient Location: Home  Provider Location: Clinic  I discussed the limitations of evaluation and management by telemedicine. The patient expressed understanding and agreed to proceed.        Objective:    There were no vitals filed for this visit. There is no height or weight on file to calculate BMI.     07/16/2021   11:28 PM 11/20/2020    5:49 PM 03/15/2020   11:11 AM 08/12/2019   10:13 AM 08/02/2019    1:20 PM 07/27/2019   10:37 AM 06/07/2019    9:13 AM  Advanced Directives  Does Patient Have a Medical Advance Directive? No No No No No No No  Does patient want to make changes to medical advance directive?    No - Patient declined No - Patient declined  No - Patient declined  Would patient like information on creating a medical advance directive?   No - Patient declined Yes (ED - Information included in AVS)       Current Medications (verified) Outpatient Encounter Medications as of 11/05/2021  Medication Sig   ACCU-CHEK AVIVA PLUS test strip USE TO TEST BLOOD SUGAR TWICE DAILY.   acetaminophen (TYLENOL) 500 MG tablet Take 500 mg by mouth every 6 (six) hours as needed (pain).   amLODipine (NORVASC) 2.5 MG tablet Take 2.5 mg by mouth at bedtime.   apixaban (ELIQUIS) 2.5 MG TABS tablet Take 1 tablet (2.5 mg total) by mouth 2 (two) times daily.   atorvastatin (LIPITOR) 40 MG tablet TAKE 1 TABLET AT BEDTIME   citalopram (CELEXA) 10 MG tablet TAKE 1 TABLET DAILY   gabapentin (NEURONTIN) 300 MG capsule Take 1 capsule (300 mg total) by mouth 3 (three) times daily.   Iron-Vitamin C 65-125 MG TABS Take 1 tablet by mouth every morning.   Lancets (ACCU-CHEK  MULTICLIX) lancets Use to test blood sugar twice daily. E11.29   levocetirizine (XYZAL) 5 MG tablet TAKE 1 TABLET EVERY EVENING   lidocaine (LIDODERM) 5 % Place 1 patch onto the skin daily. Remove & Discard patch within 12 hours or as directed by MD   NONFORMULARY OR COMPOUNDED ITEM Transport chair   #1    dx leg and hip pain s/p fall   NONFORMULARY OR COMPOUNDED ITEM Rollator walker with seat    #1   dx gait abnormality   triamcinolone cream (KENALOG) 0.5 % APPLY 1 APPLICATION TOPICALLY TWICE A DAY   Vitamin D, Ergocalciferol, (DRISDOL) 1.25 MG (50000 UNIT) CAPS capsule TAKE 1 CAPSULE EVERY 7 DAYS   budesonide (PULMICORT) 0.5 MG/2ML nebulizer solution Take 2 mLs (0.5 mg total) by nebulization 2 (two) times daily. (Patient not taking: Reported on 11/05/2021)   hyoscyamine (LEVSIN SL) 0.125 MG SL tablet Place 1 tablet (0.125 mg total) under the tongue every 4 (four) hours as needed. Take tablet every 4-6 hours for abdominal pain and cramping (Patient not taking: Reported on 11/05/2021)   ipratropium-albuterol (DUONEB) 0.5-2.5 (3) MG/3ML SOLN Take 3 mLs by nebulization every 6 (six) hours as needed. Via nebulizer and as needed (Patient not taking: Reported on 11/05/2021)   Nebulizer MISC 1 Act by Does not apply route daily. (  Patient not taking: Reported on 11/05/2021)   ondansetron (ZOFRAN ODT) 4 MG disintegrating tablet Take 1 tablet (4 mg total) by mouth every 6 (six) hours. (Patient not taking: Reported on 11/05/2021)   No facility-administered encounter medications on file as of 11/05/2021.    Allergies (verified) Jardiance [empagliflozin], Metformin and related, Codeine, Metoprolol, Relafen [nabumetone], Septra [sulfamethoxazole-trimethoprim], Tetracycline, Adhesive [tape], Ceclor [cefaclor], Cefaclor, Codeine, Jardiance [empagliflozin], Lopid [gemfibrozil], Lopid [gemfibrozil], Lopressor [metoprolol tartrate], Metformin and related, Morphine and related, Morphine and related, Niacin, Niacin and related,  Niacin and related, Relafen [nabumetone], Septra [sulfamethoxazole-trimethoprim], Tape, Tetracyclines & related, Toprol xl [metoprolol tartrate], and Toprol xl [metoprolol]   History: Past Medical History:  Diagnosis Date   Allergic rhinitis, cause unspecified    Anxiety state, unspecified    Arthritis    Colon polyp    Diaphragmatic hernia without mention of obstruction or gangrene    Diverticulosis of colon (without mention of hemorrhage)    Female stress incontinence    HTN (hypertension)    Insomnia, unspecified    Interstitial cystitis    surgery 02/01/2013   Irritable bowel syndrome    Memory loss    Other and unspecified hyperlipidemia    Other premature beats    Polyneuropathy in diabetes(357.2)    Postmenopausal bleeding    Proteinuria    Rectal ulcer    Skin cancer    Stroke ([place]) 2018   Type II or unspecified type diabetes mellitus with neurological manifestations, not stated as uncontrolled(250.60)    Unspecified essential hypertension    Viral warts, unspecified    Past Surgical History:  Procedure Laterality Date   ABDOMINAL HYSTERECTOMY  1977   [place]   CATARACT EXTRACTION, BILATERAL  02/10/2006   Dr.Groat    COLONOSCOPY  07/17/2004   polypectomy   CYSTOSCOPY  1987   Dr.Bradley   LOOP RECORDER INSERTION N/A 07/31/2016   Procedure: Loop Recorder Insertion;  Surgeon: [name], MD;  Location: [facility];  Service: Cardiovascular;  Laterality: N/A;   TEE WITHOUT CARDIOVERSION N/A 07/30/2016   Procedure: TRANSESOPHAGEAL ECHOCARDIOGRAM (TEE);  Surgeon: [name], MD;  Location: [facility] ENDOS[facility];  Service: Cardiovascular;  Laterality: N/A;   [place]  05/13/16   urinary bladder      surgery 02/01/2013 to stretch bladder stem   Family History  Problem Relation Age of Onset   Pancreatic cancer Mother 22   Heart attack Father    Bladder Cancer Father     Hypertension Sister    Diabetes Sister    COPD Sister    Hypertension Son    Diabetes Brother    Heart disease Brother    Hypertension Brother    Diabetes Brother    Heart disease Brother 91       Myocardial Infarction    Heart attack Brother    Social History   Socioeconomic History   Marital status: Widowed    Spouse name: Not on file   Number of children: 1   Years of education: Not on file   Highest education level: Not on file  Occupational History   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   Tobacco Use   Smoking status: Former    Packs/day: 0.25    Years: 62.00    Total pack years: 15.50    Types: Cigarettes    Quit date: 04/02/2011    Years  since quitting: 10.6   Smokeless tobacco: Never   Tobacco comments:    stopped consistently smoking in 2012 but still smokes occasionally (one pack per week)  Vaping Use   Vaping Use: Never used  Substance and Sexual Activity   Alcohol use: No   Drug use: No   Sexual activity: Not Currently  Other Topics Concern   Not on file  Social History Narrative   Ophthalmologist-Dr.Gould and Dr.Groat   Podiatrist- [place]   Dermatologist- Dr.Lupton   Urologist- Dr. Lawerance Bach    Lives with her son.    Social Determinants of Health   Financial Resource Strain: Low Risk  (08/30/2020)   Overall Financial Resource Strain (CARDIA)    Difficulty of Paying Living Expenses: Not hard at all  Food Insecurity: No Food Insecurity (08/30/2020)   Hunger Vital Sign    Worried About Running Out of Food in the Last Year: Never true    Ran Out of Food in the Last Year: Never true  Transportation Needs: No Transportation Needs (08/30/2020)   PRAPARE - Hydrologist (Medical): No    Lack of Transportation (Non-Medical): No  Physical Activity: Inactive (08/30/2020)   Exercise Vital Sign    Days of Exercise per Week: 0 days    Minutes of Exercise per Session: 0 min  Stress: No Stress Concern Present  (08/30/2020)   [place]    Feeling of Stress : Not at all  Social Connections: Moderately Integrated (08/30/2020)   Social Connection and Isolation Panel [NHANES]    Frequency of Communication with Friends and Family: More than three times a week    Frequency of Social Gatherings with Friends and Family: Three times a week    Attends Religious Services: More than 4 times per year    Active Member of Clubs or Organizations: No    Attends Archivist Meetings: More than 4 times per year    Marital Status: Widowed    Tobacco Counseling Counseling given: Not Answered Tobacco comments: stopped consistently smoking in 2012 but still smokes occasionally (one pack per week)   Clinical Intake:  Pre-visit preparation completed: Yes  Pain : No/denies pain     Nutritional Status: BMI > 30  Obese Nutritional Risks: None Diabetes: Yes CBG done?: No Did pt. bring in CBG monitor from home?: No  How often do you need to have someone help you when you read instructions, pamphlets, or other written materials from your doctor or pharmacy?: 1 - Never  Diabetic? Yes  Interpreter Needed?: No      Activities of Daily Living    11/05/2021    4:49 PM  In your present state of health, do you have any difficulty performing the following activities:  Hearing? 1  Vision? 1  Difficulty concentrating or making decisions? 1  Walking or climbing stairs? 1  Dressing or bathing? 1  Doing errands, shopping? 1    Patient Care Team: [name], MD as PCP - General (Internal Medicine) Minus Breeding, MD as PCP - Cardiology (Cardiology) [name], MD as PCP - Electrophysiology (Cardiology) Ulice Bold, MD as Referring Physician (Dermatology) Sharyne Peach, MD as Consulting Physician (Ophthalmology) Gean Birchwood, DPM (Inactive) as Consulting Physician (Podiatry) Clent Jacks, MD as Consulting Physician  (Ophthalmology) Myrlene Broker, MD as Attending Physician (Urology) Minus Breeding, MD as Consulting Physician (Cardiology) Debbora Presto, NP as Nurse Practitioner (Family Medicine) Shorter Cellar  Mamie Nick, MD as Consulting Physician (Gastroenterology) [name], MD (Internal Medicine)  Indicate any recent Medical Services you may have received from other than [facility] providers in the past year (date may be approximate).     Assessment:   This is a routine wellness examination for Penny Miller.  Hearing/Vision screen No results found.  Dietary issues and exercise activities discussed: Current Exercise Habits: The patient does not participate in regular exercise at present, Exercise limited by: neurologic condition(s);orthopedic condition(s)   Depression Screen    11/05/2021    4:48 PM 08/07/2021   10:12 AM 07/23/2021    2:30 PM 08/30/2020    1:50 PM 08/12/2019   10:13 AM 07/27/2019   10:34 AM 05/19/2019    8:49 AM  PHQ 2/9 Scores  PHQ - 2 Score 0 4 0 0 0 0 0  PHQ- 9 Score 0 19 6        Fall Risk    11/05/2021    4:48 PM 08/07/2021   10:13 AM 11/20/2020    2:40 PM 08/30/2020    1:51 PM 02/03/2020   11:16 AM  [place] in the past year? '1 1 1 '$ 0 0  Number falls in past yr: 1 1 0 0 0  Injury with Fall? '1 1 1 '$ 0 0  Risk for fall due to : History of fall(s);Impaired balance/gait;Impaired mobility History of fall(s) Impaired balance/gait;Impaired mobility No Fall Risks No Fall Risks  Follow up Falls prevention discussed;Education provided   Falls evaluation completed     FALL RISK PREVENTION PERTAINING TO THE HOME:  Any stairs in or around the home? No  If so, are there any without handrails? No  Home free of loose throw rugs in walkways, pet beds, electrical cords, etc? Yes  Adequate lighting in your home to reduce risk of falls? Yes   ASSISTIVE DEVICES UTILIZED TO PREVENT FALLS:  Life alert? No  Use of a cane, walker or w/c? No  Grab bars in the bathroom? Yes  Shower chair  or bench in shower? Yes  Elevated toilet seat or a handicapped toilet? Yes   TIMED UP AND GO:  Was the test performed? No .  Length of time to ambulate Unable to determine     Cognitive Function:    07/21/2017   10:23 AM 05/07/2016    9:18 AM 12/21/2013    3:42 PM  MMSE - Mini Mental State Exam  Orientation to time '5 5 5  '$ Orientation to Place '5 4 4  '$ Registration '3 3 3  '$ Attention/ Calculation '5 5 5  '$ Recall '3 3 2  '$ Language- name 2 objects '2 2 2  '$ Language- repeat '1 1 1  '$ Language- follow 3 step command '3 3 3  '$ Language- read & follow direction '1 1 1  '$ Write a sentence '1 1 1  '$ Copy design '1 1 1  '$ Total score '30 29 28        '$ 07/27/2019   10:34 AM 07/24/2018   11:11 AM  6CIT Screen  What Year? 0 points 0 points  What month? 0 points 0 points  What time? 0 points 0 points  Count back from 20 0 points 0 points  Months in reverse 0 points 0 points  Repeat phrase 4 points 0 points  Total Score 4 points 0 points    Immunizations Immunization History  Administered Date(s) Administered   Fluad Quad(high Dose 65+) 02/03/2020, 12/26/2020   Influenza, High Dose Seasonal PF 12/31/2016, 12/15/2017, 01/25/2019  Influenza,inj,Quad PF,6+ Mos 12/15/2012, 12/21/2013, 02/28/2015, 02/02/2016   Influenza-Unspecified 01/22/2012   PFIZER(Purple Top)SARS-COV-2 Vaccination 05/14/2019, 06/08/2019   Pneumococcal Conjugate-13 05/07/2016   Pneumococcal Polysaccharide-23 11/03/2019   Pneumococcal-Unspecified 01/24/1995   Td 09/14/2008   Tdap 01/25/2020    TDAP status: Up to date  Flu Vaccine status: Up to date  Pneumococcal vaccine status: Up to date  Covid-19 vaccine status: Completed vaccines  Qualifies for Shingles Vaccine? Yes   Zostavax completed No   Shingrix Completed?: No.    Education has been provided regarding the importance of this vaccine. Patient has been advised to call insurance company to determine out of pocket expense if they have not yet received this vaccine. Advised may  also receive vaccine at local pharmacy or Health Dept. Verbalized acceptance and understanding.  Screening Tests Health Maintenance  Topic Date Due   INFLUENZA VACCINE  10/30/2021   Zoster Vaccines- Shingrix (1 of 2) 11/07/2021 (Originally 09/23/1950)   OPHTHALMOLOGY EXAM  12/24/2021 (Originally 06/14/2020)   HEMOGLOBIN A1C  11/25/2021   FOOT EXAM  02/21/2022   TETANUS/TDAP  01/24/2030   Pneumonia Vaccine 42+ Years old  Completed   DEXA SCAN  Completed   HPV VACCINES  Aged Out   COVID-19 Vaccine  Discontinued    Health Maintenance  Health Maintenance Due  Topic Date Due   INFLUENZA VACCINE  10/30/2021    Colorectal cancer screening: Type of screening: Colonoscopy. Completed NA. Repeat every NA years  Mammogram status: No longer required due to  .  Bone Density status: Ordered  . Pt provided with contact info and advised to call to schedule appt.  Lung Cancer Screening: (Low Dose CT Chest recommended if Age 73-80 years, 30 pack-year currently smoking OR have quit w/in 15years.) does not qualify.   Lung Cancer Screening Referral: NA  Additional Screening:  Hepatitis C Screening: does qualify; Completed 02/27/20023  Vision Screening: Recommended annual ophthalmology exams for early detection of glaucoma and other disorders of the eye. Is the patient up to date with their annual eye exam?  Yes  Who is the provider or what is the name of the office in which the patient attends annual eye exams? Dr. Delman Cheadle If pt is not established with a provider, would they like to be referred to a provider to establish care? No .   Dental Screening: Recommended annual dental exams for proper oral hygiene  Community Resource Referral / Chronic Care Management: CRR required this visit?  No   CCM required this visit?  No      Plan:     I have personally reviewed and noted the following in the patient's chart:   Medical and social history Use of alcohol, tobacco or illicit drugs   Current medications and supplements including opioid prescriptions.  Functional ability and status Nutritional status Physical activity Advanced directives List of other physicians Hospitalizations, surgeries, and ER visits in previous 12 months Vitals Screenings to include cognitive, depression, and falls Referrals and appointments  In addition, I have reviewed and discussed with patient certain preventive protocols, quality metrics, and best practice recommendations. A written personalized care plan for preventive services as well as general preventive health recommendations were provided to patient.     Henrene Dodge, RN   11/05/2021   Nurse Notes: None face to face 30 minutes    Ms. Stegeman ,  Thank you for taking time to come for your Medicare Wellness Visit. I appreciate your ongoing commitment to your health goals. Please review the  following plan we discussed and let me know if I can assist you in the future.   These are the goals we discussed:  Goals      Weight (lb) < 165 lb (74.8 kg)      07/24/2018 would like to decrease weight to 165 lbs  Starting 05/07/2016, I will attempt to decrease my weight to reach my goal weight of 165 lbs.        This is a list of the screening recommended for you and due dates:  Health Maintenance  Topic Date Due   Flu Shot  10/30/2021   Zoster (Shingles) Vaccine (1 of 2) 11/07/2021*   Eye exam for diabetics  12/24/2021*   Hemoglobin A1C  11/25/2021   Complete foot exam   02/21/2022   Tetanus Vaccine  01/24/2030   Pneumonia Vaccine  Completed   DEXA scan (bone density measurement)  Completed   HPV Vaccine  Aged Out   COVID-19 Vaccine  Discontinued  *Topic was postponed. The date shown is not the original due date.

## 2021-11-08 NOTE — Progress Notes (Signed)
Subjective:   Penny Miller is a 88 y.o. female who presents for Medicare Annual (Subsequent) preventive examination.  Review of Systems    Defer to PCP  I connected with  [name] on 11/08/21 by a audio enabled telemedicine application and verified that I am speaking with the correct person using two identifiers.  Patient Location: Home  Provider Location: Clinic  I discussed the limitations of evaluation and management by telemedicine. The patient expressed understanding and agreed to proceed.        Objective:    There were no vitals filed for this visit. There is no height or weight on file to calculate BMI.     11/05/2021    4:50 PM 07/16/2021   11:28 PM 11/20/2020    5:49 PM 03/15/2020   11:11 AM 08/12/2019   10:13 AM 08/02/2019    1:20 PM 07/27/2019   10:37 AM  Advanced Directives  Does Patient Have a Medical Advance Directive? Yes No No No No No No  Type of Paramedic of [place];Living will        Does patient want to make changes to medical advance directive? No - Patient declined    No - Patient declined No - Patient declined   Copy of [place] in Chart? No - copy requested        Would patient like information on creating a medical advance directive?    No - Patient declined Yes (ED - Information included in AVS)      Current Medications (verified) Outpatient Encounter Medications as of 11/05/2021  Medication Sig   ACCU-CHEK AVIVA PLUS test strip USE TO TEST BLOOD SUGAR TWICE DAILY.   acetaminophen (TYLENOL) 500 MG tablet Take 500 mg by mouth every 6 (six) hours as needed (pain).   amLODipine (NORVASC) 2.5 MG tablet Take 2.5 mg by mouth at bedtime.   apixaban (ELIQUIS) 2.5 MG TABS tablet Take 1 tablet (2.5 mg total) by mouth 2 (two) times daily.   atorvastatin (LIPITOR) 40 MG tablet TAKE 1 TABLET AT BEDTIME   citalopram (CELEXA) 10 MG tablet TAKE 1 TABLET DAILY   gabapentin (NEURONTIN) 300 MG capsule Take 1  capsule (300 mg total) by mouth 3 (three) times daily.   Iron-Vitamin C 65-125 MG TABS Take 1 tablet by mouth every morning.   Lancets (ACCU-CHEK MULTICLIX) lancets Use to test blood sugar twice daily. E11.29   levocetirizine (XYZAL) 5 MG tablet TAKE 1 TABLET EVERY EVENING   lidocaine (LIDODERM) 5 % Place 1 patch onto the skin daily. Remove & Discard patch within 12 hours or as directed by MD   NONFORMULARY OR COMPOUNDED ITEM Transport chair   #1    dx leg and hip pain s/p fall   NONFORMULARY OR COMPOUNDED ITEM Rollator walker with seat    #1   dx gait abnormality   triamcinolone cream (KENALOG) 0.5 % APPLY 1 APPLICATION TOPICALLY TWICE A DAY   Vitamin D, Ergocalciferol, (DRISDOL) 1.25 MG (50000 UNIT) CAPS capsule TAKE 1 CAPSULE EVERY 7 DAYS   budesonide (PULMICORT) 0.5 MG/2ML nebulizer solution Take 2 mLs (0.5 mg total) by nebulization 2 (two) times daily. (Patient not taking: Reported on 11/05/2021)   hyoscyamine (LEVSIN SL) 0.125 MG SL tablet Place 1 tablet (0.125 mg total) under the tongue every 4 (four) hours as needed. Take tablet every 4-6 hours for abdominal pain and cramping (Patient not taking: Reported on 11/05/2021)   ipratropium-albuterol (DUONEB) 0.5-2.5 (3) MG/3ML  SOLN Take 3 mLs by nebulization every 6 (six) hours as needed. Via nebulizer and as needed (Patient not taking: Reported on 11/05/2021)   Nebulizer MISC 1 Act by Does not apply route daily. (Patient not taking: Reported on 11/05/2021)   ondansetron (ZOFRAN ODT) 4 MG disintegrating tablet Take 1 tablet (4 mg total) by mouth every 6 (six) hours. (Patient not taking: Reported on 11/05/2021)   No facility-administered encounter medications on file as of 11/05/2021.    Allergies (verified) Jardiance [empagliflozin], Metformin and related, Codeine, Metoprolol, Relafen [nabumetone], Septra [sulfamethoxazole-trimethoprim], Tetracycline, Adhesive [tape], Ceclor [cefaclor], Cefaclor, Codeine, Jardiance [empagliflozin], Lopid [gemfibrozil],  Lopid [gemfibrozil], Lopressor [metoprolol tartrate], Metformin and related, Morphine and related, Morphine and related, Niacin, Niacin and related, Niacin and related, Relafen [nabumetone], Septra [sulfamethoxazole-trimethoprim], Tape, Tetracyclines & related, Toprol xl [metoprolol tartrate], and Toprol xl [metoprolol]   History: Past Medical History:  Diagnosis Date   Allergic rhinitis, cause unspecified    Anxiety state, unspecified    Arthritis    Colon polyp    Diaphragmatic hernia without mention of obstruction or gangrene    Diverticulosis of colon (without mention of hemorrhage)    Female stress incontinence    HTN (hypertension)    Insomnia, unspecified    Interstitial cystitis    surgery 02/01/2013   Irritable bowel syndrome    Memory loss    Other and unspecified hyperlipidemia    Other premature beats    Polyneuropathy in diabetes(357.2)    Postmenopausal bleeding    Proteinuria    Rectal ulcer    Skin cancer    Stroke ([place]) 2018   Type II or unspecified type diabetes mellitus with neurological manifestations, not stated as uncontrolled(250.60)    Unspecified essential hypertension    Viral warts, unspecified    Past Surgical History:  Procedure Laterality Date   ABDOMINAL HYSTERECTOMY  1977   [place]   CATARACT EXTRACTION, BILATERAL  02/10/2006   Dr.Groat    COLONOSCOPY  07/17/2004   polypectomy   CYSTOSCOPY  1987   Dr.Bradley   LOOP RECORDER INSERTION N/A 07/31/2016   Procedure: Loop Recorder Insertion;  Surgeon: [name], MD;  Location: [facility];  Service: Cardiovascular;  Laterality: N/A;   TEE WITHOUT CARDIOVERSION N/A 07/30/2016   Procedure: TRANSESOPHAGEAL ECHOCARDIOGRAM (TEE);  Surgeon: [name], MD;  Location[facility] ENDOSCOPY;  Service: Cardiovascular;  Laterality: N/A;   [place]  05/13/16   urinary bladder      surgery 02/01/2013 to stretch bladder  stem   Family History  Problem Relation Age of Onset   Pancreatic cancer Mother 67   Heart attack Father    Bladder Cancer Father    Hypertension Sister    Diabetes Sister    COPD Sister    Hypertension Son    Diabetes Brother    Heart disease Brother    Hypertension Brother    Diabetes Brother    Heart disease Brother 39       Myocardial Infarction    Heart attack Brother    Social History   Socioeconomic History   Marital status: Widowed    Spouse name: Not on file   Number of children: 1   Years of education: Not on file   Highest education level: Not on file  Occupational History   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   Tobacco Use  Smoking status: Former    Packs/day: 0.25    Years: 62.00    Total pack years: 15.50    Types: Cigarettes    Quit date: 04/02/2011    Years since quitting: 10.6   Smokeless tobacco: Never   Tobacco comments:    stopped consistently smoking in 2012 but still smokes occasionally (one pack per week)  Vaping Use   Vaping Use: Never used  Substance and Sexual Activity   Alcohol use: No   Drug use: No   Sexual activity: Not Currently  Other Topics Concern   Not on file  Social History Narrative   Ophthalmologist-Dr.Gould and Dr.Groat   Podiatrist- [place]   Dermatologist- Dr.Lupton   Urologist- Dr. Lawerance Bach    Lives with her son.    Social Determinants of Health   Financial Resource Strain: Low Risk  (08/30/2020)   Overall Financial Resource Strain (CARDIA)    Difficulty of Paying Living Expenses: Not hard at all  Food Insecurity: No Food Insecurity (08/30/2020)   Hunger Vital Sign    Worried About Running Out of Food in the Last Year: Never true    Ran Out of Food in the Last Year: Never true  Transportation Needs: No Transportation Needs (08/30/2020)   PRAPARE - Hydrologist (Medical): No    Lack of Transportation (Non-Medical): No  Physical Activity: Inactive (08/30/2020)    Exercise Vital Sign    Days of Exercise per Week: 0 days    Minutes of Exercise per Session: 0 min  Stress: No Stress Concern Present (08/30/2020)   [place]    Feeling of Stress : Not at all  Social Connections: Moderately Integrated (08/30/2020)   Social Connection and Isolation Panel [NHANES]    Frequency of Communication with Friends and Family: More than three times a week    Frequency of Social Gatherings with Friends and Family: Three times a week    Attends Religious Services: More than 4 times per year    Active Member of Clubs or Organizations: No    Attends Archivist Meetings: More than 4 times per year    Marital Status: Widowed    Tobacco Counseling Counseling given: Not Answered Tobacco comments: stopped consistently smoking in 2012 but still smokes occasionally (one pack per week)   Clinical Intake:  Pre-visit preparation completed: Yes  Pain : No/denies pain     Nutritional Status: BMI > 30  Obese Nutritional Risks: None Diabetes: Yes CBG done?: No Did pt. bring in CBG monitor from home?: No  How often do you need to have someone help you when you read instructions, pamphlets, or other written materials from your doctor or pharmacy?: 1 - Never  Diabetic? Yes  Interpreter Needed?: No      Activities of Daily Living    11/05/2021    4:49 PM  In your present state of health, do you have any difficulty performing the following activities:  Hearing? 1  Vision? 1  Difficulty concentrating or making decisions? 1  Walking or climbing stairs? 1  Dressing or bathing? 1  Doing errands, shopping? 1    Patient Care Team: [name], MD as PCP - General (Internal Medicine) Minus Breeding, MD as PCP - Cardiology (Cardiology) [name], MD as PCP - Electrophysiology (Cardiology) Ulice Bold, MD as Referring Physician (Dermatology) Sharyne Peach, MD as Consulting  Physician (Ophthalmology) Gean Birchwood, DPM (Inactive) as Consulting  Physician (Podiatry) Clent Jacks, MD as Consulting Physician (Ophthalmology) Myrlene Broker, MD as Attending Physician (Urology) Minus Breeding, MD as Consulting Physician (Cardiology) Debbora Presto, NP as Nurse Practitioner (Family Medicine) Armbruster, Carlota Raspberry, MD as Consulting Physician (Gastroenterology) [name], MD (Internal Medicine)  Indicate any recent Medical Services you may have received from other than [facility] providers in the past year (date may be approximate).     Assessment:   This is a routine wellness examination for Hessie.  Hearing/Vision screen No results found.  Dietary issues and exercise activities discussed: Current Exercise Habits: The patient does not participate in regular exercise at present, Exercise limited by: neurologic condition(s);orthopedic condition(s)   Depression Screen    11/05/2021    4:48 PM 08/07/2021   10:12 AM 07/23/2021    2:30 PM 08/30/2020    1:50 PM 08/12/2019   10:13 AM 07/27/2019   10:34 AM 05/19/2019    8:49 AM  PHQ 2/9 Scores  PHQ - 2 Score 0 4 0 0 0 0 0  PHQ- 9 Score 0 19 6        Fall Risk    11/05/2021    4:48 PM 08/07/2021   10:13 AM 11/20/2020    2:40 PM 08/30/2020    1:51 PM 02/03/2020   11:16 AM  [place] in the past year? '1 1 1 '$ 0 0  Number falls in past yr: 1 1 0 0 0  Injury with Fall? '1 1 1 '$ 0 0  Risk for fall due to : History of fall(s);Impaired balance/gait;Impaired mobility History of fall(s) Impaired balance/gait;Impaired mobility No Fall Risks No Fall Risks  Follow up Falls prevention discussed;Education provided   Falls evaluation completed     FALL RISK PREVENTION PERTAINING TO THE HOME:  Any stairs in or around the home? No  If so, are there any without handrails? No  Home free of loose throw rugs in walkways, pet beds, electrical cords, etc? Yes  Adequate lighting in your home to reduce risk of falls? Yes    ASSISTIVE DEVICES UTILIZED TO PREVENT FALLS:  Life alert? No  Use of a cane, walker or w/c? No  Grab bars in the bathroom? Yes  Shower chair or bench in shower? Yes  Elevated toilet seat or a handicapped toilet? Yes   TIMED UP AND GO:  Was the test performed? No .  Length of time to ambulate Unable to determine     Cognitive Function:      07/27/2019   10:34 AM 07/24/2018   11:11 AM  6CIT Screen  What Year? 0 points 0 points  What month? 0 points 0 points  What time? 0 points 0 points  Count back from 20 0 points 0 points  Months in reverse 0 points 0 points  Repeat phrase 4 points 0 points  Total Score 4 points 0 points    Immunizations Immunization History  Administered Date(s) Administered   Fluad Quad(high Dose 65+) 02/03/2020, 12/26/2020   Influenza, High Dose Seasonal PF 12/31/2016, 12/15/2017, 01/25/2019   Influenza,inj,Quad PF,6+ Mos 12/15/2012, 12/21/2013, 02/28/2015, 02/02/2016   Influenza-Unspecified 01/22/2012   PFIZER(Purple Top)SARS-COV-2 Vaccination 05/14/2019, 06/08/2019   Pneumococcal Conjugate-13 05/07/2016   Pneumococcal Polysaccharide-23 11/03/2019   Pneumococcal-Unspecified 01/24/1995   Td 09/14/2008   Tdap 01/25/2020    TDAP status: Up to date  Flu Vaccine status: Up to date  Pneumococcal vaccine status: Up to date  Covid-19 vaccine status: Completed vaccines  Qualifies for Shingles Vaccine?  Yes   Zostavax completed No   Shingrix Completed?: No.    Education has been provided regarding the importance of this vaccine. Patient has been advised to call insurance company to determine out of pocket expense if they have not yet received this vaccine. Advised may also receive vaccine at local pharmacy or Health Dept. Verbalized acceptance and understanding.  Screening Tests Health Maintenance  Topic Date Due   Zoster Vaccines- Shingrix (1 of 2) Never done   INFLUENZA VACCINE  10/30/2021   OPHTHALMOLOGY EXAM  12/24/2021 (Originally  06/14/2020)   HEMOGLOBIN A1C  11/25/2021   FOOT EXAM  02/21/2022   TETANUS/TDAP  01/24/2030   Pneumonia Vaccine 26+ Years old  Completed   DEXA SCAN  Completed   HPV VACCINES  Aged Out   COVID-19 Vaccine  Discontinued    Health Maintenance  Health Maintenance Due  Topic Date Due   Zoster Vaccines- Shingrix (1 of 2) Never done   INFLUENZA VACCINE  10/30/2021    Colorectal cancer screening: Type of screening: Colonoscopy. Completed NA. Repeat every NA years  Mammogram status: No longer required due to  .  Bone Density status: Ordered  . Pt provided with contact info and advised to call to schedule appt.  Lung Cancer Screening: (Low Dose CT Chest recommended if Age 84-80 years, 30 pack-year currently smoking OR have quit w/in 15years.) does not qualify.   Lung Cancer Screening Referral: NA  Additional Screening:  Hepatitis C Screening: does qualify; Completed 02/27/20023  Vision Screening: Recommended annual ophthalmology exams for early detection of glaucoma and other disorders of the eye. Is the patient up to date with their annual eye exam?  Yes  Who is the provider or what is the name of the office in which the patient attends annual eye exams? Dr. Delman Cheadle If pt is not established with a provider, would they like to be referred to a provider to establish care? No .   Dental Screening: Recommended annual dental exams for proper oral hygiene  Community Resource Referral / Chronic Care Management: CRR required this visit?  No   CCM required this visit?  No      Plan:     I have personally reviewed and noted the following in the patient's chart:   Medical and social history Use of alcohol, tobacco or illicit drugs  Current medications and supplements including opioid prescriptions.  Functional ability and status Nutritional status Physical activity Advanced directives List of other physicians Hospitalizations, surgeries, and ER visits in previous 12  months Vitals Screenings to include cognitive, depression, and falls Referrals and appointments  In addition, I have reviewed and discussed with patient certain preventive protocols, quality metrics, and best practice recommendations. A written personalized care plan for preventive services as well as general preventive health recommendations were provided to patient.     Henrene Dodge, RN   11/05/2021   Nurse Notes: None face to face 30 minutes    Ms. Bolin ,  Thank you for taking time to come for your Medicare Wellness Visit. I appreciate your ongoing commitment to your health goals. Please review the following plan we discussed and let me know if I can assist you in the future.   These are the goals we discussed:  Goals      Weight (lb) < 165 lb (74.8 kg)      07/24/2018 would like to decrease weight to 165 lbs  Starting 05/07/2016, I will attempt to decrease my weight to reach  my goal weight of 165 lbs.        This is a list of the screening recommended for you and due dates:  Health Maintenance  Topic Date Due   Zoster (Shingles) Vaccine (1 of 2) Never done   Flu Shot  10/30/2021   Eye exam for diabetics  12/24/2021*   Hemoglobin A1C  11/25/2021   Complete foot exam   02/21/2022   Tetanus Vaccine  01/24/2030   Pneumonia Vaccine  Completed   DEXA scan (bone density measurement)  Completed   HPV Vaccine  Aged Out   COVID-19 Vaccine  Discontinued  *Topic was postponed. The date shown is not the original due date.

## 2021-11-09 ENCOUNTER — Telehealth: Payer: Self-pay

## 2021-11-09 NOTE — Telephone Encounter (Signed)
Pt's son called in to report that his mother experienced issues with breathing/catching her breath when she woke up this morning. He reports that her pulse was 39 bpm when she was getting up. When she was up walking around it dropped down to into the 40s. 02 stats were in the high 80s low 90s. Currently the patient's heart rate is 68 and her o2 is 96.  Spoke with Dr. Ronnald Ramp and he advised the patient needs to go to the ED because she may need a pace maker placed.   Spoke with the patient's son to give him Dr. Ronnald Ramp recommendations. He verbalized understanding and he stated that he would take her now.

## 2021-11-12 ENCOUNTER — Encounter: Payer: Self-pay | Admitting: Cardiology

## 2021-11-12 ENCOUNTER — Telehealth: Payer: Self-pay | Admitting: Internal Medicine

## 2021-11-12 NOTE — Telephone Encounter (Signed)
Error

## 2021-11-12 NOTE — Telephone Encounter (Signed)
STAT if HR is under 50 or over 120 (normal HR is 60-100 beats per minute)  What is your heart rate? Doesn't know patient is asleep   Do you have a log of your heart rate readings (document readings)? Mid 40's - low 50's Friday morning HR 39  Do you have any other symptoms? Numb leg when HR was 39. Irregular HR when checked.   Patient's son advised they called patient's PCP when HR dropped to 43 on 08/11 and was advised to go to the hospital stating she may need a pacemaker. They did not go due to them calling the hospital and being told it's a long wait. They are requesting an appt to be seen today to determine if a pacemaker is needed. Please advise.

## 2021-11-12 NOTE — Telephone Encounter (Signed)
Pt [name] called back after speaking with leadership.   Pt Penny Miller moved off Dr. Hassell Done schedule for tomorrow 8/15, and per Myrtie Hawk, RN; [name] can see this patient at 830 am 11/13/2021 to discuss Pacemaker implant procedure.     Pt [name] understood Pt Penny Miller needs to be at [facility] on [address] at 830 am tomorrow morning.  No f/u req at this time as Pt and family understands tomorrows appointment.

## 2021-11-12 NOTE — Telephone Encounter (Signed)
Pt contacted [facility] regarding Bradycardia concern;  Pt [name] stated Friday night / early morning hours of  11/09/21, Pt HR was 39.  Pt was also short of breath at times.  Throughout the weekend, Per [name], Pt HR between 40's to mid 50's.  PCP [name] office recommend an ER visit to r/o need for pacemaker, but Pt declined due to long ER wait times? Pt at noon today are better with HR at 61, and O2 in the low 90's.  Pt [name] states her HR is irregular, and Pt wants to see a cardiologist this week.  An appointment with DOD, [name] scheduled for 330 pm tomorrow 11/13/2021, per Pt request.   Pt was seen by [name], but scheduled with [name]- due to sooner need for PPM.  Pt [name] advised to take Pt to ER if symptoms persist or worsen asap, regardless of ER wait times.  Pt son educated on cardiac emergencys, and importance of Emergency Medical Care.

## 2021-11-13 ENCOUNTER — Ambulatory Visit: Payer: Medicare Other | Admitting: Interventional Cardiology

## 2021-11-13 ENCOUNTER — Ambulatory Visit (INDEPENDENT_AMBULATORY_CARE_PROVIDER_SITE_OTHER): Payer: Medicare Other | Admitting: Internal Medicine

## 2021-11-13 ENCOUNTER — Ambulatory Visit (INDEPENDENT_AMBULATORY_CARE_PROVIDER_SITE_OTHER): Payer: Medicare Other

## 2021-11-13 DIAGNOSIS — I495 Sick sinus syndrome: Secondary | ICD-10-CM

## 2021-11-13 DIAGNOSIS — I63442 Cerebral infarction due to embolism of left cerebellar artery: Secondary | ICD-10-CM

## 2021-11-13 DIAGNOSIS — I480 Paroxysmal atrial fibrillation: Secondary | ICD-10-CM

## 2021-11-13 NOTE — Patient Instructions (Addendum)
Medication Instructions:  Your physician recommends that you continue on your current medications as directed. Please refer to the Current Medication list given to you today.  *If you need a refill on your cardiac medications before your next appointment, please call your pharmacy*  Lab Work: None ordered.  If you have labs (blood work) drawn today and your tests are completely normal, you will receive your results only by: [place] (if you have MyChart) OR A paper copy in the mail If you have any lab test that is abnormal or we need to change your treatment, we will call you to review the results.  Testing/Procedures: 7 Day Zio Monitor    Follow-Up: We will follow up with you when we receive the results of the 7 Day Zio Monitor    Your physician has recommended that you wear a Zio monitor.   This monitor is a medical device that records the heart's electrical activity. Doctors most often use these monitors to diagnose arrhythmias. Arrhythmias are problems with the speed or rhythm of the heartbeat. The monitor is a small device applied to your chest. You can wear one while you do your normal daily activities. While wearing this monitor if you have any symptoms to push the button and record what you felt. Once you have worn this monitor for the period of time provider prescribed (Usually 14 days), you will return the monitor device in the postage paid box. Once it is returned they will download the data collected and provide [place] with a report which the provider will then review and we will call you with those results. Important tips:  Avoid showering during the first 24 hours of wearing the monitor. Avoid excessive sweating to help maximize wear time. Do not submerge the device, no hot tubs, and no swimming pools. Keep any lotions or oils away from the patch. After 24 hours you may shower with the patch on. Take brief showers with your back facing the shower head.  Do not remove  patch once it has been placed because that will interrupt data and decrease adhesive wear time. Push the button when you have any symptoms and write down what you were feeling. Once you have completed wearing your monitor, remove and place into box which has postage paid and place in your outgoing mailbox.  If for some reason you have misplaced your box then call our office and we can provide another box and/or mail it off for you.

## 2021-11-13 NOTE — Progress Notes (Unsigned)
Enrolled for [employer] to mail a ZIO XT long term holter monitor to the patients address on file.  

## 2021-11-13 NOTE — Progress Notes (Signed)
HPI Penny Miller returns today after a long absence from our EP clinic. She is a pleasant 88 yo woman with a h/o cryptogenic stroke, who underwent ILR insertion remotely, and was found to have PAF. She has been found on pulse ox to have significant bradycardia associated with feeling badly. Unclear what her actual rhythm was but she was clearly symptomatic. Since then she has felt ok. She is fairly sedentary. She has not had syncope. She does not appear to feel her atrial fib. She does not typically have much in the way of heart failure symptoms though she was short of breath when her heart was slow.  Allergies  Allergen Reactions   Jardiance [Empagliflozin] Diarrhea   Metformin And Related Diarrhea   Codeine Nausea And Vomiting   Metoprolol Nausea And Vomiting   Relafen [Nabumetone] Nausea And Vomiting   Septra [Sulfamethoxazole-Trimethoprim] Nausea And Vomiting   Tetracycline Nausea And Vomiting   Adhesive [Tape] Rash and Other (See Comments)    Tears the skin also   Ceclor [Cefaclor] Other (See Comments)    Unknown reaction; not recalled   Cefaclor Other (See Comments)    Unknown reaction   Codeine Nausea And Vomiting   Jardiance [Empagliflozin] Diarrhea   Lopid [Gemfibrozil] Other (See Comments)    Unknown reaction; not recalled   Lopid [Gemfibrozil] Other (See Comments)    Unknown reaction   Lopressor [Metoprolol Tartrate] Nausea And Vomiting   Metformin And Related Diarrhea   Morphine And Related Other (See Comments)    Passed out   Morphine And Related Other (See Comments)    Passed out   Niacin Nausea And Vomiting    Unknown reaction Unknown reaction; not recalled   Niacin And Related Other (See Comments)    Unknown reaction; not recalled   Niacin And Related Other (See Comments)    Unknown reaction   Relafen [Nabumetone] Nausea And Vomiting   Septra [Sulfamethoxazole-Trimethoprim] Nausea And Vomiting   Tape Rash and Other (See Comments)    Tears skin    Tetracyclines & Related Nausea And Vomiting   Toprol Xl [Metoprolol Tartrate] Nausea And Vomiting   Toprol Xl [Metoprolol] Nausea And Vomiting     Current Outpatient Medications  Medication Sig Dispense Refill   ACCU-CHEK AVIVA PLUS test strip USE TO TEST BLOOD SUGAR TWICE DAILY. 100 each 11   acetaminophen (TYLENOL) 500 MG tablet Take 500 mg by mouth every 6 (six) hours as needed (pain).     amLODipine (NORVASC) 2.5 MG tablet Take 2.5 mg by mouth at bedtime.     apixaban (ELIQUIS) 2.5 MG TABS tablet Take 1 tablet (2.5 mg total) by mouth 2 (two) times daily. 180 tablet 0   atorvastatin (LIPITOR) 40 MG tablet TAKE 1 TABLET AT BEDTIME 90 tablet 1   budesonide (PULMICORT) 0.5 MG/2ML nebulizer solution Take 2 mLs (0.5 mg total) by nebulization 2 (two) times daily. (Patient not taking: Reported on 11/05/2021) 120 mL 5   citalopram (CELEXA) 10 MG tablet TAKE 1 TABLET DAILY 90 tablet 1   gabapentin (NEURONTIN) 300 MG capsule Take 1 capsule (300 mg total) by mouth 3 (three) times daily. 90 capsule 1   hyoscyamine (LEVSIN SL) 0.125 MG SL tablet Place 1 tablet (0.125 mg total) under the tongue every 4 (four) hours as needed. Take tablet every 4-6 hours for abdominal pain and cramping (Patient not taking: Reported on 11/05/2021) 15 tablet 2   ipratropium-albuterol (DUONEB) 0.5-2.5 (3) MG/3ML SOLN Take 3 mLs by  nebulization every 6 (six) hours as needed. Via nebulizer and as needed (Patient not taking: Reported on 11/05/2021) 450 mL 2   Iron-Vitamin C 65-125 MG TABS Take 1 tablet by mouth every morning.     Lancets (ACCU-CHEK MULTICLIX) lancets Use to test blood sugar twice daily. E11.29 100 each 11   levocetirizine (XYZAL) 5 MG tablet TAKE 1 TABLET EVERY EVENING 90 tablet 3   lidocaine (LIDODERM) 5 % Place 1 patch onto the skin daily. Remove & Discard patch within 12 hours or as directed by MD 60 patch 1   Nebulizer MISC 1 Act by Does not apply route daily. (Patient not taking: Reported on 11/05/2021) 1 each 2    NONFORMULARY OR COMPOUNDED ITEM Transport chair   #1    dx leg and hip pain s/p fall 1 each 0   NONFORMULARY OR COMPOUNDED ITEM Rollator walker with seat    #1   dx gait abnormality 1 each 0   ondansetron (ZOFRAN ODT) 4 MG disintegrating tablet Take 1 tablet (4 mg total) by mouth every 6 (six) hours. (Patient not taking: Reported on 11/05/2021) 15 tablet 5   triamcinolone cream (KENALOG) 0.5 % APPLY 1 APPLICATION TOPICALLY TWICE A DAY 450 g 3   Vitamin D, Ergocalciferol, (DRISDOL) 1.25 MG (50000 UNIT) CAPS capsule TAKE 1 CAPSULE EVERY 7 DAYS 12 capsule 0   No current facility-administered medications for this visit.     Past Medical History:  Diagnosis Date   Allergic rhinitis, cause unspecified    Anxiety state, unspecified    Arthritis    Colon polyp    Diaphragmatic hernia without mention of obstruction or gangrene    Diverticulosis of colon (without mention of hemorrhage)    Female stress incontinence    HTN (hypertension)    Insomnia, unspecified    Interstitial cystitis    surgery 02/01/2013   Irritable bowel syndrome    Memory loss    Other and unspecified hyperlipidemia    Other premature beats    Polyneuropathy in diabetes(357.2)    Postmenopausal bleeding    Proteinuria    Rectal ulcer    Skin cancer    Stroke ([place]) 2018   Type II or unspecified type diabetes mellitus with neurological manifestations, not stated as uncontrolled(250.60)    Unspecified essential hypertension    Viral warts, unspecified     ROS:   All systems reviewed and negative except as noted in the HPI.   Past Surgical History:  Procedure Laterality Date   ABDOMINAL HYSTERECTOMY  1977   [facility]   CATARACT EXTRACTION, BILATERAL  02/10/2006   Dr.Groat    COLONOSCOPY  07/17/2004   polypectomy   CYSTOSCOPY  1987   Dr.Bradley   LOOP RECORDER INSERTION N/A 07/31/2016   Procedure: Loop Recorder Insertion;  Surgeon: [name], MD;  Location: [facility];   Service: Cardiovascular;  Laterality: N/A;   TEE WITHOUT CARDIOVERSION N/A 07/30/2016   Procedure: TRANSESOPHAGEAL ECHOCARDIOGRAM (TEE);  Surgeon: [name], MD;  Location: [facility];  Service: Cardiovascular;  Laterality: N/A;   [place]  05/13/16   urinary bladder      surgery 02/01/2013 to stretch bladder stem     Family History  Problem Relation Age of Onset   Pancreatic cancer Mother 32   Heart attack Father    Bladder Cancer Father    Hypertension Sister  Diabetes Sister    COPD Sister    Hypertension Son    Diabetes Brother    Heart disease Brother    Hypertension Brother    Diabetes Brother    Heart disease Brother 37       Myocardial Infarction    Heart attack Brother      Social History   Socioeconomic History   Marital status: Widowed    Spouse name: Not on file   Number of children: 1   Years of education: Not on file   Highest education level: Not on file  Occupational History   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   Tobacco Use   Smoking status: Former    Packs/day: 0.25    Years: 62.00    Total pack years: 15.50    Types: Cigarettes    Quit date: 04/02/2011    Years since quitting: 10.6   Smokeless tobacco: Never   Tobacco comments:    stopped consistently smoking in 2012 but still smokes occasionally (one pack per week)  Vaping Use   Vaping Use: Never used  Substance and Sexual Activity   Alcohol use: No   Drug use: No   Sexual activity: Not Currently  Other Topics Concern   Not on file  Social History Narrative   Ophthalmologist-Dr.Gould and Dr.Groat   Podiatrist- Dr.Tuchman   Dermatologist- Dr.Lupton   Urologist- Dr. Lawerance Bach    Lives with her son.    Social Determinants of Health   Financial Resource Strain: Low Risk  (08/30/2020)   Overall Financial Resource Strain (CARDIA)    Difficulty of Paying Living Expenses: Not hard at all  Food Insecurity:  No Food Insecurity (08/30/2020)   Hunger Vital Sign    Worried About Running Out of Food in the Last Year: Never true    Ran Out of Food in the Last Year: Never true  Transportation Needs: No Transportation Needs (08/30/2020)   PRAPARE - Hydrologist (Medical): No    Lack of Transportation (Non-Medical): No  Physical Activity: Inactive (08/30/2020)   Exercise Vital Sign    Days of Exercise per Week: 0 days    Minutes of Exercise per Session: 0 min  Stress: No Stress Concern Present (08/30/2020)   [place]    Feeling of Stress : Not at all  Social Connections: Moderately Integrated (08/30/2020)   Social Connection and Isolation Panel [NHANES]    Frequency of Communication with Friends and Family: More than three times a week    Frequency of Social Gatherings with Friends and Family: Three times a week    Attends Religious Services: More than 4 times per year    Active Member of Clubs or Organizations: No    Attends Archivist Meetings: More than 4 times per year    Marital Status: Widowed  Intimate Partner Violence: Not At Risk (08/30/2020)   Humiliation, Afraid, Rape, and Kick questionnaire    Fear of Current or Ex-Partner: No    Emotionally Abused: No    Physically Abused: No    Sexually Abused: No     BP - 127/84, P - 54, R - 18  Physical Exam:  Well appearing elderly woman, NAD HEENT: Unremarkable Neck:  No JVD, no thyromegally Lymphatics:  No adenopathy Back:  No CVA tenderness Lungs:  Clear HEART:  Regular rate rhythm, no murmurs, no rubs, no clicks Abd:  soft,  positive bowel sounds, no organomegally, no rebound, no guarding Ext:  2 plus pulses, no edema, no cyanosis, no clubbing Skin:  No rashes no nodules Neuro:  CN II through XII intact, motor grossly intact  EKG - reviewed  Assess/Plan:  Probable sinus node dysfunction - unclear if her episodes of bradycardia  documented on her pulse ox were sinus brady, heart block, PVC's or PAC's or slow atrial fib. I have asked her to wear a 7 day zio monitor. Additional rec's will be based on the findings. If she is found to have significant brady, then I would recommend PPM insertion and ILR removal. PAF - she appears to be asymptomatic. She is tolerating her eliquis without bleeding.  \ Carleene Overlie Susen Haskew,MD

## 2021-11-15 DIAGNOSIS — I480 Paroxysmal atrial fibrillation: Secondary | ICD-10-CM

## 2021-11-15 DIAGNOSIS — I495 Sick sinus syndrome: Secondary | ICD-10-CM

## 2021-11-22 NOTE — Addendum Note (Signed)
Addended by: Drue Novel I on: 11/22/2021 10:43 AM   Modules accepted: Orders

## 2021-11-30 DIAGNOSIS — I480 Paroxysmal atrial fibrillation: Secondary | ICD-10-CM | POA: Diagnosis not present

## 2021-11-30 DIAGNOSIS — I495 Sick sinus syndrome: Secondary | ICD-10-CM | POA: Diagnosis not present

## 2021-12-07 ENCOUNTER — Telehealth: Payer: Self-pay

## 2021-12-07 NOTE — Telephone Encounter (Signed)
Called patient to follow up s/p Zio 7 day Monitor, and to discuss Dr. Tanna Furry plan for Penny Miller.  I discussed the loop recorder with [name], and discussed loop recorder in detail.  Penny Miller will discuss details with his mother Penny Miller, and will reach out to [place] next week;

## 2021-12-10 NOTE — Telephone Encounter (Signed)
Pt son called Merrilee Seashore RN back in Triage to discuss scheduling appointment for ILR discussion and implantation with Dr. Beckie Salts.    Pt son Shavonda Wiedman stated Ms. Beckey Polkowski would like to move forward with scheduling this appointment.    I will message [facility] and [place] to put this patients procedure / visit on Dr. Forde Dandy schedule.  No f/u required at this time.

## 2021-12-10 NOTE — Telephone Encounter (Signed)
Pt's son is calling back requesting to speak to Enzo Montgomery, RN. Transferred.

## 2021-12-20 ENCOUNTER — Other Ambulatory Visit: Payer: Self-pay | Admitting: Internal Medicine

## 2022-01-04 ENCOUNTER — Other Ambulatory Visit: Payer: Self-pay | Admitting: Internal Medicine

## 2022-01-04 DIAGNOSIS — I6782 Cerebral ischemia: Secondary | ICD-10-CM

## 2022-01-04 DIAGNOSIS — E785 Hyperlipidemia, unspecified: Secondary | ICD-10-CM

## 2022-01-11 ENCOUNTER — Ambulatory Visit (INDEPENDENT_AMBULATORY_CARE_PROVIDER_SITE_OTHER): Payer: Medicare Other | Admitting: Podiatry

## 2022-01-11 ENCOUNTER — Encounter: Payer: Self-pay | Admitting: Podiatry

## 2022-01-11 DIAGNOSIS — M79675 Pain in left toe(s): Secondary | ICD-10-CM | POA: Diagnosis not present

## 2022-01-11 DIAGNOSIS — B351 Tinea unguium: Secondary | ICD-10-CM

## 2022-01-11 DIAGNOSIS — M79674 Pain in right toe(s): Secondary | ICD-10-CM | POA: Diagnosis not present

## 2022-01-11 DIAGNOSIS — E1142 Type 2 diabetes mellitus with diabetic polyneuropathy: Secondary | ICD-10-CM

## 2022-01-11 NOTE — Progress Notes (Unsigned)
Subjective:  Patient ID: Penny Miller, female    DOB: Aug 09, 1931,  MRN: [id number]  Penny Miller presents to clinic today for:  Chief Complaint  Patient presents with   Nail Problem    Diabetic foot care BS-did not check today A1C-Do not know PCP-Thomas Jones PCP VST-2 or 3 months ago   New problem(s): None.   Patient is accompanied by her granddaughter, [name], on today's visit.  PCP is [name], MD , and last visit was  September 24, 2021.  Allergies  Allergen Reactions   Jardiance [Empagliflozin] Diarrhea   Metformin And Related Diarrhea   Codeine Nausea And Vomiting   Metoprolol Nausea And Vomiting   Relafen [Nabumetone] Nausea And Vomiting   Septra [Sulfamethoxazole-Trimethoprim] Nausea And Vomiting   Tetracycline Nausea And Vomiting   Adhesive [Tape] Rash and Other (See Comments)    Tears the skin also   Ceclor [Cefaclor] Other (See Comments)    Unknown reaction; not recalled   Cefaclor Other (See Comments)    Unknown reaction   Codeine Nausea And Vomiting   Jardiance [Empagliflozin] Diarrhea   Lopid [Gemfibrozil] Other (See Comments)    Unknown reaction; not recalled   Lopid [Gemfibrozil] Other (See Comments)    Unknown reaction   Lopressor [Metoprolol Tartrate] Nausea And Vomiting   Metformin And Related Diarrhea   Morphine And Related Other (See Comments)    Passed out   Morphine And Related Other (See Comments)    Passed out   Niacin Nausea And Vomiting    Unknown reaction Unknown reaction; not recalled   Niacin And Related Other (See Comments)    Unknown reaction; not recalled   Niacin And Related Other (See Comments)    Unknown reaction   Relafen [Nabumetone] Nausea And Vomiting   Septra [Sulfamethoxazole-Trimethoprim] Nausea And Vomiting   Tape Rash and Other (See Comments)    Tears skin   Tetracyclines & Related Nausea And Vomiting   Toprol Xl [Metoprolol Tartrate] Nausea And Vomiting   Toprol Xl [Metoprolol] Nausea And Vomiting    Review of Systems: Negative except as noted in the HPI.  Objective: No changes noted in today's physical examination.  SERRENA Miller is a pleasant 88 y.o. female in NAD. AAO x 3.  Vascular Examination: CFT <4 seconds b/l LE. Palpable DP pulse(s) b/l LE. Palpable PT pulse(s) b/l LE. Pedal hair absent. No pain with calf compression RLE. Trace edema noted BLE. Varicosities present b/l. No cyanosis or clubbing noted b/l LE.  Dermatological Examination: Pedal skin is warm and supple b/l LE. No open wounds b/l LE. No interdigital macerations noted b/l LE. Toenails 1-5 b/l elongated, discolored, dystrophic, thickened, crumbly with subungual debris and tenderness to dorsal palpation.   Resolving hyperkeratotic lesion(s) bilateral great toes.   Resolved porokeratotic lesions plantar right heel with tenderness to palpation. No erythema, no edema, no drainage, no flocculence.  No erythema, no edema, no drainage, no fluctuance.  Musculoskeletal Examination: Muscle strength 5/5 to all lower extremity muscle groups bilaterally. Wearing Hey Dude shoes. No pain, crepitus or joint limitation noted with ROM bilateral LE. No gross bony deformities bilaterally. Utilizes rollator for ambulation assistance.  Neurological Examination: Pt has subjective symptoms of neuropathy. Protective sensation intact 5/5 intact bilaterally with 10g monofilament b/l. Vibratory sensation intact b/l. Proprioception intact bilaterally.  Assessment/Plan: 1. Pain due to onychomycosis of toenails of both feet   2. Diabetic peripheral neuropathy (HCC)     No orders of the defined types were placed in  this encounter.   -Patient's family member present. All questions/concerns addressed on today's visit. -Examined patient. -Continue foot and shoe inspections daily. Monitor blood glucose per PCP/Endocrinologist's recommendations. -Patient to continue soft, supportive shoe gear daily. -Mycotic toenails 1-5 bilaterally were  debrided in length and girth with sterile nail nippers and dremel without incident. -Patient/POA to call should there be question/concern in the interim.   Return in about 3 months (around 04/13/2022).  Marzetta Board, DPM

## 2022-01-15 ENCOUNTER — Other Ambulatory Visit: Payer: Self-pay | Admitting: Internal Medicine

## 2022-01-15 DIAGNOSIS — E559 Vitamin D deficiency, unspecified: Secondary | ICD-10-CM

## 2022-01-17 ENCOUNTER — Ambulatory Visit: Payer: Medicare Other | Attending: Internal Medicine | Admitting: Internal Medicine

## 2022-01-17 ENCOUNTER — Encounter: Payer: Self-pay | Admitting: Internal Medicine

## 2022-01-17 VITALS — BP 132/58 | HR 60 | Ht 67.0 in | Wt 155.0 lb

## 2022-01-17 DIAGNOSIS — I480 Paroxysmal atrial fibrillation: Secondary | ICD-10-CM | POA: Insufficient documentation

## 2022-01-17 DIAGNOSIS — I495 Sick sinus syndrome: Secondary | ICD-10-CM | POA: Diagnosis not present

## 2022-01-17 DIAGNOSIS — I63442 Cerebral infarction due to embolism of left cerebellar artery: Secondary | ICD-10-CM

## 2022-01-17 DIAGNOSIS — I493 Ventricular premature depolarization: Secondary | ICD-10-CM | POA: Diagnosis not present

## 2022-01-17 NOTE — Patient Instructions (Addendum)
Medication Instructions:  Your physician recommends that you continue on your current medications as directed. Please refer to the Current Medication list given to you today.  *If you need a refill on your cardiac medications before your next appointment, please call your pharmacy*  Lab Work: None ordered.  If you have labs (blood work) drawn today and your tests are completely normal, you will receive your results only by: [place] (if you have MyChart) OR A paper copy in the mail If you have any lab test that is abnormal or we need to change your treatment, we will call you to review the results.  Testing/Procedures: None ordered.  Follow-Up: These are the dates that we have available for a [employer] Pacemaker Implant with Dr. Cristopher Peru.    November, 1, 3, 6, 13, 15, 27, 29  Please let us know through [place], or call [phone] with what date works best for you.  Thank you.   Pacemaker Implantation, Adult Pacemaker implantation is a procedure to place a pacemaker inside the chest. A pacemaker is a small computer that sends electrical signals to the heart and helps the heart beat normally. A pacemaker also stores information about heart rhythms. You may need pacemaker implantation if you have: A slow heartbeat (bradycardia). Loss of consciousness that happens repeatedly (syncope) or repeated episodes of dizziness or light-headedness because of an irregular heart rate. Shortness of breath (dyspnea) due to heart problems. The pacemaker usually attaches to your heart through a wire called a lead. One or two leads may be needed. There are different types of pacemakers: Transvenous pacemaker. This type is placed under the skin or muscle of your upper chest area. The lead goes through a vein in the chest area to reach the inside of the heart. Epicardial pacemaker. This type is placed under the skin or muscle of your chest or abdomen. The lead goes through your chest to the  outside of the heart. Tell a health care provider about: Any allergies you have. All medicines you are taking, including vitamins, herbs, eye drops, creams, and over-the-counter medicines. Any problems you or family members have had with anesthetic medicines. Any blood or bone disorders you have. Any surgeries you have had. Any medical conditions you have. Whether you are pregnant or may be pregnant. What are the risks? Generally, this is a safe procedure. However, problems may occur, including: Infection. Bleeding. Failure of the pacemaker or the lead. Collapse of a lung or bleeding into a lung. Blood clot inside a blood vessel with a lead. Damage to the heart. Infection inside the heart (endocarditis). Allergic reactions to medicines. What happens before the procedure? Staying hydrated Follow instructions from your health care provider about hydration, which may include: Up to 2 hours before the procedure - you may continue to drink clear liquids, such as water, clear fruit juice, black coffee, and plain tea.  Eating and drinking restrictions Follow instructions from your health care provider about eating and drinking, which may include: 8 hours before the procedure - stop eating heavy meals or foods, such as meat, fried foods, or fatty foods. 6 hours before the procedure - stop eating light meals or foods, such as toast or cereal. 6 hours before the procedure - stop drinking milk or drinks that contain milk. 2 hours before the procedure - stop drinking clear liquids. Medicines Ask your health care provider about: Changing or stopping your regular medicines. This is especially important if you are taking diabetes medicines or blood thinners.  Taking medicines such as aspirin and ibuprofen. These medicines can thin your blood. Do not take these medicines unless your health care provider tells you to take them. Taking over-the-counter medicines, vitamins, herbs, and  supplements. Tests You may have: A heart evaluation. This may include: An electrocardiogram (ECG). This involves placing patches on your skin to check your heart rhythm. A chest X-ray. An echocardiogram. This is a test that uses sound waves (ultrasound) to produce an image of the heart. A cardiac rhythm monitor. This is used to record your heart rhythm and any events for a longer period of time. Blood tests. Genetic testing. General instructions Do not use any products that contain nicotine or tobacco for at least 4 weeks before the procedure. These products include cigarettes, e-cigarettes, and chewing tobacco. If you need help quitting, ask your health care provider. Ask your health care provider: How your surgery site will be marked. What steps will be taken to help prevent infection. These steps may include: Removing hair at the surgery site. Washing skin with a germ-killing soap. Receiving antibiotic medicine. Plan to have someone take you home from the hospital or clinic. If you will be going home right after the procedure, plan to have someone with you for 24 hours. What happens during the procedure? An IV will be inserted into one of your veins. You will be given one or more of the following: A medicine to help you relax (sedative). A medicine to numb the area (local anesthetic). A medicine to make you fall asleep (general anesthetic). The next steps vary depending on the type of pacemaker you will be getting. If you are getting a transvenous pacemaker: An incision will be made in your upper chest. A pocket will be made for the pacemaker. It may be placed under the skin or between layers of muscle. The lead will be inserted into a blood vessel that goes to the heart. While X-rays are taken by an imaging machine (fluoroscopy), the lead will be advanced through the vein to the inside of your heart. The other end of the lead will be tunneled under the skin and attached to the  pacemaker. If you are getting an epicardial pacemaker: An incision will be made near your ribs or breastbone (sternum) for the lead. The lead will be attached to the outside of your heart. Another incision will be made in your chest or upper abdomen to create a pocket for the pacemaker. The free end of the lead will be tunneled under the skin and attached to the pacemaker. The transvenous or epicardial pacemaker will be tested. Imaging studies may be done to check the lead position. The incisions will be closed with stitches (sutures), adhesive strips, or skin glue. Bandages (dressings) will be placed over the incisions. The procedure may vary among health care providers and hospitals. What happens after the procedure? Your blood pressure, heart rate, breathing rate, and blood oxygen level will be monitored until you leave the hospital or clinic. You may be given antibiotics. You will be given pain medicine. An ECG and chest X-rays will be done. You may need to wear a continuous type of ECG (Holter monitor) to check your heart rhythm. Your health care provider will program the pacemaker. If you were given a sedative during the procedure, it can affect you for several hours. Do not drive or operate machinery until your health care provider says that it is safe. You will be given a pacemaker identification card. This card lists the  implant date, device model, and manufacturer of your pacemaker. Summary A pacemaker is a small computer that sends electrical signals to the heart and helps the heart beat normally. There are different types of pacemakers. A pacemaker may be placed under the skin or muscle of your chest or abdomen. Follow instructions from your health care provider about eating and drinking and about taking medicines before the procedure. This information is not intended to replace advice given to you by your health care provider. Make sure you discuss any questions you have with your  health care provider. Document Revised: 11/28/2020 Document Reviewed: 02/17/2019 Elsevier Patient Education  [employer].

## 2022-01-17 NOTE — Progress Notes (Signed)
        HPI Penny Miller returns today for followup. She has a h/o cryptogenic stroke and was found to have PAF. I saw her a couple of months ago and she had experienced unexplained bradycardia. She wore a cardiac zio monitor demonstrating daytime episodes of bradycardia when she is in atrial fib which she is about 5% of the time. She returns to discuss additional treatment. She had symptomatic brady with her atrial fib with rates down into the 30's. These episodes last seconds to a minute and resolve. She has not had syncope recently.       Allergies  Allergen Reactions   Jardiance [Empagliflozin] Diarrhea   Metformin And Related Diarrhea   Codeine Nausea And Vomiting   Metoprolol Nausea And Vomiting   Relafen [Nabumetone] Nausea And Vomiting   Septra [Sulfamethoxazole-Trimethoprim] Nausea And Vomiting   Tetracycline Nausea And Vomiting   Adhesive [Tape] Rash and Other (See Comments)      Tears the skin also   Ceclor [Cefaclor] Other (See Comments)      Unknown reaction; not recalled   Cefaclor Other (See Comments)      Unknown reaction   Codeine Nausea And Vomiting   Jardiance [Empagliflozin] Diarrhea   Lopid [Gemfibrozil] Other (See Comments)      Unknown reaction; not recalled   Lopid [Gemfibrozil] Other (See Comments)      Unknown reaction   Lopressor [Metoprolol Tartrate] Nausea And Vomiting   Metformin And Related Diarrhea   Morphine And Related Other (See Comments)      Passed out   Morphine And Related Other (See Comments)      Passed out   Niacin Nausea And Vomiting      Unknown reaction Unknown reaction; not recalled   Niacin And Related Other (See Comments)      Unknown reaction; not recalled   Niacin And Related Other (See Comments)      Unknown reaction   Relafen [Nabumetone] Nausea And Vomiting   Septra [Sulfamethoxazole-Trimethoprim] Nausea And Vomiting   Tape Rash and Other (See Comments)      Tears skin   Tetracyclines & Related Nausea And Vomiting    Toprol Xl [Metoprolol Tartrate] Nausea And Vomiting   Toprol Xl [Metoprolol] Nausea And Vomiting              Current Outpatient Medications  Medication Sig Dispense Refill   ACCU-CHEK AVIVA PLUS test strip USE TO TEST BLOOD SUGAR TWICE DAILY. 100 each 11   acetaminophen (TYLENOL) 500 MG tablet Take 500 mg by mouth every 6 (six) hours as needed (pain).       amLODipine (NORVASC) 2.5 MG tablet Take 2.5 mg by mouth at bedtime.       apixaban (ELIQUIS) 2.5 MG TABS tablet Take 1 tablet (2.5 mg total) by mouth 2 (two) times daily. 180 tablet 0   atorvastatin (LIPITOR) 40 MG tablet TAKE 1 TABLET AT BEDTIME 90 tablet 1   budesonide (PULMICORT) 0.5 MG/2ML nebulizer solution Take 2 mLs (0.5 mg total) by nebulization 2 (two) times daily. 120 mL 5   citalopram (CELEXA) 10 MG tablet TAKE 1 TABLET DAILY 90 tablet 1   Cyanocobalamin (VITAMIN B 12 PO) Take by mouth.       gabapentin (NEURONTIN) 300 MG capsule Take 1 capsule (300 mg total) by mouth 3 (three) times daily. 90 capsule 1   hyoscyamine (LEVSIN SL) 0.125 MG SL tablet Place 1 tablet (0.125 mg total) under the tongue every 4 (  four) hours as needed. Take tablet every 4-6 hours for abdominal pain and cramping 15 tablet 2   ipratropium-albuterol (DUONEB) 0.5-2.5 (3) MG/3ML SOLN Take 3 mLs by nebulization every 6 (six) hours as needed. Via nebulizer and as needed 450 mL 2   Lancets (ACCU-CHEK MULTICLIX) lancets Use to test blood sugar twice daily. E11.29 100 each 11   levocetirizine (XYZAL) 5 MG tablet TAKE 1 TABLET EVERY EVENING 90 tablet 3   lidocaine (LIDODERM) 5 % Place 1 patch onto the skin daily. Remove & Discard patch within 12 hours or as directed by MD 60 patch 1   Nebulizer MISC 1 Act by Does not apply route daily. 1 each 2   NONFORMULARY OR COMPOUNDED ITEM Transport chair   #1    dx leg and hip pain s/p fall 1 each 0   NONFORMULARY OR COMPOUNDED ITEM Rollator walker with seat    #1   dx gait abnormality 1 each 0   ondansetron (ZOFRAN ODT) 4  MG disintegrating tablet Take 1 tablet (4 mg total) by mouth every 6 (six) hours. 15 tablet 5   pioglitazone (ACTOS) 15 MG tablet Take 15 mg by mouth daily.       triamcinolone cream (KENALOG) 0.5 % APPLY 1 APPLICATION TOPICALLY TWICE A DAY 450 g 3   Vitamin D, Ergocalciferol, (DRISDOL) 1.25 MG (50000 UNIT) CAPS capsule TAKE 1 CAPSULE EVERY 7 DAYS 12 capsule 0    No current facility-administered medications for this visit.            Past Medical History:  Diagnosis Date   Allergic rhinitis, cause unspecified     Anxiety state, unspecified     Arthritis     Colon polyp     Diaphragmatic hernia without mention of obstruction or gangrene     Diverticulosis of colon (without mention of hemorrhage)     Female stress incontinence     HTN (hypertension)     Insomnia, unspecified     Interstitial cystitis      surgery 02/01/2013   Irritable bowel syndrome     Memory loss     Other and unspecified hyperlipidemia     Other premature beats     Polyneuropathy in diabetes(357.2)     Postmenopausal bleeding     Proteinuria     Rectal ulcer     Skin cancer     Stroke (HCC) 2018   Type II or unspecified type diabetes mellitus with neurological manifestations, not stated as uncontrolled(250.60)     Unspecified essential hypertension     Viral warts, unspecified        ROS:    All systems reviewed and negative except as noted in the HPI.          Past Surgical History:  Procedure Laterality Date   ABDOMINAL HYSTERECTOMY   1977    Dr.Hambright   APPENDECTOMY   1965   CATARACT EXTRACTION, BILATERAL   02/10/2006    Dr.Groat    COLONOSCOPY   07/17/2004    polypectomy   CYSTOSCOPY   1987    Dr.Bradley   LOOP RECORDER INSERTION N/A 07/31/2016    Procedure: Loop Recorder Insertion;  Surgeon: Meira Wahba W Keldric Poyer, MD;  Location: [facility];  Service: Cardiovascular;  Laterality: N/A;   TEE WITHOUT CARDIOVERSION N/A 07/30/2016    Procedure: TRANSESOPHAGEAL ECHOCARDIOGRAM (TEE);  Surgeon:  Peter C Nishan, MD;  Location: [facility];  Service: Cardiovascular;  Laterality: N/A;   TRIGGER FINGER RELEASE     1990    Dr.Carter   TRIGGER FINGER RELEASE   05/13/16   urinary bladder         surgery 02/01/2013 to stretch bladder stem             Family History  Problem Relation Age of Onset   Pancreatic cancer Mother 62   Heart attack Father     Bladder Cancer Father     Hypertension Sister     Diabetes Sister     COPD Sister     Hypertension Son     Diabetes Brother     Heart disease Brother     Hypertension Brother     Diabetes Brother     Heart disease Brother 75        Myocardial Infarction    Heart attack Brother          Social History         Socioeconomic History   Marital status: Widowed      Spouse name: Not on file   Number of children: 1   Years of education: Not on file   Highest education level: Not on file  Occupational History   Occupation: Teacher retired      Comment: Piano Teacher   Tobacco Use   Smoking status: Former      Packs/day: 0.25      Years: 62.00      Total pack years: 15.50      Types: Cigarettes      Quit date: 04/02/2011      Years since quitting: 10.8   Smokeless tobacco: Never   Tobacco comments:      stopped consistently smoking in 2012 but still smokes occasionally (one pack per week)  Vaping Use   Vaping Use: Never used  Substance and Sexual Activity   Alcohol use: No   Drug use: No   Sexual activity: Not Currently  Other Topics Concern   Not on file  Social History Narrative    Ophthalmologist-Dr.Gould and Dr.Groat    Podiatrist- Dr.Tuchman    Dermatologist- Dr.Lupton    Urologist- Dr. Ron Thomason     Lives with her son.     Social Determinants of Health        Financial Resource Strain: Low Risk  (08/30/2020)    Overall Financial Resource Strain (CARDIA)     Difficulty of Paying Living Expenses: Not hard at all  Food Insecurity: No Food Insecurity (08/30/2020)    Hunger Vital Sign     Worried About Running  Out of Food in the Last Year: Never true     Ran Out of Food in the Last Year: Never true  Transportation Needs: No Transportation Needs (08/30/2020)    PRAPARE - Transportation     Lack of Transportation (Medical): No     Lack of Transportation (Non-Medical): No  Physical Activity: Inactive (08/30/2020)    Exercise Vital Sign     Days of Exercise per Week: 0 days     Minutes of Exercise per Session: 0 min  Stress: No Stress Concern Present (08/30/2020)    [employer] - Occupational Stress Questionnaire     Feeling of Stress : Not at all  Social Connections: Moderately Integrated (08/30/2020)    Social Connection and Isolation Panel [NHANES]     Frequency of Communication with Friends and Family: More than three times a week     Frequency of Social Gatherings with Friends and Family: Three times a   week     Attends Religious Services: More than 4 times per year     Active Member of Clubs or Organizations: No     Attends Club or Organization Meetings: More than 4 times per year     Marital Status: Widowed  Intimate Partner Violence: Not At Risk (08/30/2020)    Humiliation, Afraid, Rape, and Kick questionnaire     Fear of Current or Ex-Partner: No     Emotionally Abused: No     Physically Abused: No     Sexually Abused: No        BP (!) 132/58   Pulse 60   Ht 5' 7" (1.702 m)   Wt 155 lb (70.3 kg)   SpO2 94%   BMI 24.28 kg/m    Physical Exam:   Well appearing NAD HEENT: Unremarkable Neck:  No JVD, no thyromegally Lymphatics:  No adenopathy Back:  No CVA tenderness Lungs:  Clear HEART:  Regular rate rhythm, no murmurs, no rubs, no clicks Abd:  soft, positive bowel sounds, no organomegally, no rebound, no guarding Ext:  2 plus pulses, no edema, no cyanosis, no clubbing Skin:  No rashes no nodules Neuro:  CN II through XII intact, motor grossly intact   EKG - nsr   Assess/Plan:  PAF - she has periods of very slow VR in the 30's for which she is  symptomatic. I have offered her PPM insertion. She will call us if she wishes to proceed. Symptomatic bradycardia - see above. Interestingly this is occurring when she is in atrial fib.  Chronic anti-coagulation - we would have her hold her blood thinner for 2 days prior to the procedure. Dyspnea - this correlates with her atrial fib. I would consider AA drug therapy after PPM insertion. Cyndal Kasson,MD 

## 2022-01-21 ENCOUNTER — Telehealth: Payer: Self-pay | Admitting: Internal Medicine

## 2022-01-21 NOTE — Telephone Encounter (Signed)
Pts son [name] advised that I will forward his note to Dr Tanna Furry nurse, Merrilee Seashore and he can expect a call back later in the week.

## 2022-01-21 NOTE — Telephone Encounter (Signed)
Patient's [name] is calling back stating they have decided they would like to do 11/27 with [name] for the pacemaker implant if it is still available. Please advise.

## 2022-01-23 ENCOUNTER — Ambulatory Visit (INDEPENDENT_AMBULATORY_CARE_PROVIDER_SITE_OTHER): Payer: Medicare Other | Admitting: Internal Medicine

## 2022-01-23 ENCOUNTER — Encounter: Payer: Self-pay | Admitting: Internal Medicine

## 2022-01-23 VITALS — BP 128/60 | HR 73 | Temp 98.2°F | Ht 67.0 in | Wt 158.0 lb

## 2022-01-23 DIAGNOSIS — E039 Hypothyroidism, unspecified: Secondary | ICD-10-CM | POA: Diagnosis not present

## 2022-01-23 DIAGNOSIS — I10 Essential (primary) hypertension: Secondary | ICD-10-CM

## 2022-01-23 DIAGNOSIS — E1122 Type 2 diabetes mellitus with diabetic chronic kidney disease: Secondary | ICD-10-CM | POA: Diagnosis not present

## 2022-01-23 DIAGNOSIS — D696 Thrombocytopenia, unspecified: Secondary | ICD-10-CM

## 2022-01-23 DIAGNOSIS — I63442 Cerebral infarction due to embolism of left cerebellar artery: Secondary | ICD-10-CM | POA: Diagnosis not present

## 2022-01-23 DIAGNOSIS — I480 Paroxysmal atrial fibrillation: Secondary | ICD-10-CM | POA: Diagnosis not present

## 2022-01-23 DIAGNOSIS — N1831 Chronic kidney disease, stage 3a: Secondary | ICD-10-CM

## 2022-01-23 DIAGNOSIS — Z23 Encounter for immunization: Secondary | ICD-10-CM | POA: Diagnosis not present

## 2022-01-23 DIAGNOSIS — D539 Nutritional anemia, unspecified: Secondary | ICD-10-CM | POA: Diagnosis not present

## 2022-01-23 DIAGNOSIS — N184 Chronic kidney disease, stage 4 (severe): Secondary | ICD-10-CM

## 2022-01-23 DIAGNOSIS — L2084 Intrinsic (allergic) eczema: Secondary | ICD-10-CM

## 2022-01-23 DIAGNOSIS — J301 Allergic rhinitis due to pollen: Secondary | ICD-10-CM | POA: Diagnosis not present

## 2022-01-23 LAB — TSH: TSH: 3.52 u[IU]/mL (ref 0.35–5.50)

## 2022-01-23 LAB — CBC WITH DIFFERENTIAL/PLATELET
Basophils Absolute: 0.1 10*3/uL (ref 0.0–0.1)
Basophils Relative: 0.8 % (ref 0.0–3.0)
Eosinophils Absolute: 0.2 10*3/uL (ref 0.0–0.7)
Eosinophils Relative: 2.8 % (ref 0.0–5.0)
HCT: 25.4 % — ABNORMAL LOW (ref 36.0–46.0)
Hemoglobin: 8.2 g/dL — ABNORMAL LOW (ref 12.0–15.0)
Lymphocytes Relative: 13.1 % (ref 12.0–46.0)
Lymphs Abs: 0.9 10*3/uL (ref 0.7–4.0)
MCHC: 32.3 g/dL (ref 30.0–36.0)
MCV: 106.4 fl — ABNORMAL HIGH (ref 78.0–100.0)
Monocytes Absolute: 0.8 10*3/uL (ref 0.1–1.0)
Monocytes Relative: 11.2 % (ref 3.0–12.0)
Neutro Abs: 5.2 10*3/uL (ref 1.4–7.7)
Neutrophils Relative %: 72.1 % (ref 43.0–77.0)
Platelets: 183 10*3/uL (ref 150.0–400.0)
RBC: 2.39 Mil/uL — ABNORMAL LOW (ref 3.87–5.11)
RDW: 17.3 % — ABNORMAL HIGH (ref 11.5–15.5)
WBC: 7.2 10*3/uL (ref 4.0–10.5)

## 2022-01-23 LAB — VITAMIN B12: Vitamin B-12: 198 pg/mL — ABNORMAL LOW (ref 211–911)

## 2022-01-23 LAB — BASIC METABOLIC PANEL
BUN: 23 mg/dL (ref 6–23)
CO2: 28 mEq/L (ref 19–32)
Calcium: 9.3 mg/dL (ref 8.4–10.5)
Chloride: 104 mEq/L (ref 96–112)
Creatinine, Ser: 1.22 mg/dL — ABNORMAL HIGH (ref 0.40–1.20)
GFR: 39.67 mL/min — ABNORMAL LOW (ref >60.00)
Glucose, Bld: 130 mg/dL — ABNORMAL HIGH (ref 70–99)
Potassium: 4.3 mEq/L (ref 3.5–5.1)
Sodium: 138 mEq/L (ref 135–145)

## 2022-01-23 LAB — FOLATE: Folate: 9.5 ng/mL (ref >5.9)

## 2022-01-23 LAB — HEMOGLOBIN A1C: Hgb A1c MFr Bld: 4.9 % (ref 4.6–6.5)

## 2022-01-23 MED ORDER — LEVOCETIRIZINE DIHYDROCHLORIDE 5 MG PO TABS: 5.0000 mg | ORAL_TABLET | Freq: Every evening | ORAL | 1 refills | Status: DC

## 2022-01-23 NOTE — Telephone Encounter (Signed)
Pt called and MDT PPM scheduled for 02/25/22 at 930 am.   Pt coming in for labs on 11/16, and Instruction letter MyChart'd to patient 01/23/22.    Pt will pick up scrub soap on 11/16.  Procedure scheduled.

## 2022-01-23 NOTE — Patient Instructions (Signed)

## 2022-01-23 NOTE — Progress Notes (Unsigned)
Subjective:  Patient ID: Penny Miller, female    DOB: 01/23/1932  Age: 88 y.o. MRN: [id number]  CC: Anemia   HPI Penny Miller presents for f/up -  She complains of worsening SOB and LE edema.  Outpatient Medications Prior to Visit  Medication Sig Dispense Refill   ACCU-CHEK AVIVA PLUS test strip USE TO TEST BLOOD SUGAR TWICE DAILY. 100 each 11   acetaminophen (TYLENOL) 500 MG tablet Take 500 mg by mouth every 6 (six) hours as needed (pain).     amLODipine (NORVASC) 2.5 MG tablet Take 2.5 mg by mouth at bedtime.     apixaban (ELIQUIS) 2.5 MG TABS tablet Take 1 tablet (2.5 mg total) by mouth 2 (two) times daily. 180 tablet 0   atorvastatin (LIPITOR) 40 MG tablet TAKE 1 TABLET AT BEDTIME 90 tablet 1   citalopram (CELEXA) 10 MG tablet TAKE 1 TABLET DAILY 90 tablet 1   Cyanocobalamin (VITAMIN B 12 PO) Take by mouth.     gabapentin (NEURONTIN) 300 MG capsule Take 1 capsule (300 mg total) by mouth 3 (three) times daily. 90 capsule 1   hyoscyamine (LEVSIN SL) 0.125 MG SL tablet Place 1 tablet (0.125 mg total) under the tongue every 4 (four) hours as needed. Take tablet every 4-6 hours for abdominal pain and cramping 15 tablet 2   Lancets (ACCU-CHEK MULTICLIX) lancets Use to test blood sugar twice daily. E11.29 100 each 11   lidocaine (LIDODERM) 5 % Place 1 patch onto the skin daily. Remove & Discard patch within 12 hours or as directed by MD 60 patch 1   Nebulizer MISC 1 Act by Does not apply route daily. 1 each 2   NONFORMULARY OR COMPOUNDED ITEM Transport chair   #1    dx leg and hip pain s/p fall 1 each 0   NONFORMULARY OR COMPOUNDED ITEM Rollator walker with seat    #1   dx gait abnormality 1 each 0   pioglitazone (ACTOS) 15 MG tablet Take 15 mg by mouth daily.     triamcinolone cream (KENALOG) 0.5 % APPLY 1 APPLICATION TOPICALLY TWICE A DAY 450 g 3   Vitamin D, Ergocalciferol, (DRISDOL) 1.25 MG (50000 UNIT) CAPS capsule TAKE 1 CAPSULE EVERY 7 DAYS 12 capsule 0   budesonide  (PULMICORT) 0.5 MG/2ML nebulizer solution Take 2 mLs (0.5 mg total) by nebulization 2 (two) times daily. 120 mL 5   ipratropium-albuterol (DUONEB) 0.5-2.5 (3) MG/3ML SOLN Take 3 mLs by nebulization every 6 (six) hours as needed. Via nebulizer and as needed 450 mL 2   levocetirizine (XYZAL) 5 MG tablet TAKE 1 TABLET EVERY EVENING 90 tablet 3   ondansetron (ZOFRAN ODT) 4 MG disintegrating tablet Take 1 tablet (4 mg total) by mouth every 6 (six) hours. 15 tablet 5   No facility-administered medications prior to visit.    ROS Review of Systems  HENT:  Positive for postnasal drip and rhinorrhea. Negative for sinus pressure.   Respiratory:  Negative for cough, chest tightness, shortness of breath and wheezing.   Cardiovascular:  Positive for leg swelling. Negative for chest pain and palpitations.  Gastrointestinal:  Negative for abdominal pain, constipation, diarrhea, nausea and vomiting.  Genitourinary:  Negative for difficulty urinating.  Musculoskeletal: Negative.   Skin:  Positive for rash.  Neurological:  Negative for dizziness and weakness.  Hematological:  Negative for adenopathy. Does not bruise/bleed easily.  Psychiatric/Behavioral: Negative.      Objective:  BP 128/60 (BP Location: Left Arm, Patient Position:  Sitting, Cuff Size: Large)   Pulse 73   Temp 98.2 F (36.8 C) (Oral)   Ht '5\' 7"'$  (1.702 m)   Wt 158 lb (71.7 kg)   SpO2 95%   BMI 24.75 kg/m   BP Readings from Last 3 Encounters:  01/23/22 128/60  01/17/22 (!) 132/58  09/24/21 138/68    Wt Readings from Last 3 Encounters:  01/23/22 158 lb (71.7 kg)  01/17/22 155 lb (70.3 kg)  09/24/21 150 lb (68 kg)    Physical Exam Cardiovascular:     Rate and Rhythm: Normal rate and regular rhythm.     Heart sounds: Normal heart sounds, S1 normal and S2 normal. No murmur heard.    No gallop.  Musculoskeletal:     Right lower leg: 2+ Edema present.     Left lower leg: 2+ Edema present.     Lab Results  Component Value  Date   WBC 7.2 01/23/2022   HGB 8.2 Repeated and verified X2. (L) 01/23/2022   HCT 25.4 (L) 01/23/2022   PLT 183.0 01/23/2022   GLUCOSE 130 (H) 01/23/2022   CHOL 166 05/28/2021   TRIG 192.0 (H) 05/28/2021   HDL 44.10 05/28/2021   LDLCALC 84 05/28/2021   ALT 13 07/16/2021   AST 14 (L) 07/16/2021   NA 138 01/23/2022   K 4.3 01/23/2022   CL 104 01/23/2022   CREATININE 1.22 (H) 01/23/2022   BUN 23 01/23/2022   CO2 28 01/23/2022   TSH 3.52 01/23/2022   INR 1.2 07/16/2021   HGBA1C 4.9 01/23/2022   MICROALBUR 9.7 (H) 05/28/2021    CT CHEST ABDOMEN PELVIS W CONTRAST  Result Date: 07/16/2021 CLINICAL DATA:  Fall EXAM: CT CHEST, ABDOMEN, AND PELVIS WITH CONTRAST TECHNIQUE: Multidetector CT imaging of the chest, abdomen and pelvis was performed following the standard protocol during bolus administration of intravenous contrast. RADIATION DOSE REDUCTION: This exam was performed according to the departmental dose-optimization program which includes automated exposure control, adjustment of the mA and/or kV according to patient size and/or use of iterative reconstruction technique. CONTRAST:  23m OMNIPAQUE IOHEXOL 300 MG/ML  SOLN COMPARISON:  MRI lumbar spine 12/12/2020, CT abdomen pelvis 02/05/2019 FINDINGS: CHEST: Ports and Devices: None. Lungs/airways: No focal consolidation. A 6 mm left upper lobe ground-glass airspace opacity (4:46). Punctate calcified micronodule within left upper lobe (4:58). No pulmonary mass. No pulmonary contusion or laceration. No pneumatocele formation. The central airways are patent. Pleura: No pleural effusion. No pneumothorax. No hemothorax. Lymph Nodes: No mediastinal, hilar, or axillary lymphadenopathy. Mediastinum: No pneumomediastinum. No aortic injury or mediastinal hematoma. The thoracic aorta is normal in caliber. Severe atherosclerotic plaque. At least 3 vessel coronary calcification. Slightly enlarged heart. No significant pericardial effusion. No central  pulmonary embolus. The esophagus is unremarkable. The thyroid is unremarkable. Chest Wall / Breasts: No chest wall mass. Musculoskeletal: No acute rib or sternal fracture. Interval development of an acute T12 compression fracture versus incomplete burst fracture with at least 40% height loss centrally. 2 mm retropulsion into the central canal. ABDOMEN / PELVIS: Liver: Not enlarged. No focal lesion. No laceration or subcapsular hematoma. Biliary System: The gallbladder is otherwise unremarkable with no radio-opaque gallstones. No biliary ductal dilatation. Pancreas: Normal pancreatic contour. No main pancreatic duct dilatation. Spleen: Not enlarged. Subcentimeter hypodensity within the spleen too small to characterize. No laceration, subcapsular hematoma, or vascular injury. Adrenal Glands: No nodularity bilaterally. Kidneys: Bilateral kidneys enhance symmetrically. No hydronephrosis. No contusion, laceration, or subcapsular hematoma. Subcentimeter left renal hypodensity too small  to characterize. No injury to the vascular structures or collecting systems. No hydroureter. The urinary bladder is unremarkable. Bowel: No small or large bowel wall thickening or dilatation. Redemonstration of a large second/third portion of the duodenum diverticula. Scattered colonic diverticulosis with no acute diverticulitis. The appendix is not definitely identified with no inflammatory changes in the right lower quadrant to suggest acute appendicitis. Mesentery, Omentum, and Peritoneum: No simple free fluid ascites. No pneumoperitoneum. No hemoperitoneum. No mesenteric hematoma identified. No organized fluid collection. Pelvic Organs: Normal. Lymph Nodes: No abdominal, pelvic, inguinal lymphadenopathy. Vasculature: Severe atherosclerotic plaque of the aorta. No abdominal aorta or iliac aneurysm. No active contrast extravasation or pseudoaneurysm. Musculoskeletal: No significant soft tissue hematoma. Partially visualized fat density  lesion along the proximal left thigh musculature (3:129). No acute pelvic fracture. No spinal fracture. Multilevel severe degenerative changes of the lumbar spine with stable appearing grade 1 anterolisthesis of L3 on L4 and L4 on L5. Associated endplate sclerosis and intervertebral disc space vacuum phenomenon at the L5-S1 level. L2-L3 and L5-S1 intervertebral disc space vacuum phenomenon. IMPRESSION: 1. No acute traumatic injury to the chest, abdomen, or pelvis. 2. Acute T12 compression fracture versus incomplete burst fracture with 2 mm retropulsion into the central canal. 3. No acute fracture or traumatic malalignment of the lumbar spine. Other imaging findings of potential clinical significance: 1. A 6 mm left upper lobe ground-glass airspace opacity. Initial follow-up with CT at 6 months is recommended to confirm persistence. If persistent, repeat CT is recommended every 2 years until 5 years of stability has been established. This recommendation follows the consensus statement: Guidelines for Management of Incidental Pulmonary Nodules Detected on CT Images: From the Fleischner Society 2017; Radiology 2017; 284:228-243. 2. Aortic Atherosclerosis (ICD10-I70.0) including three-vessel coronary calcifications. 3. Scattered colonic diverticulosis with no acute diverticulitis. 4. Partially visualized lipomatous lesion along the proximal left thigh musculature Electronically Signed   By: Iven Finn M.D.   On: 07/16/2021 21:00   CT HEAD WO CONTRAST  Result Date: 07/16/2021 CLINICAL DATA:  Trauma.  Fall. EXAM: CT HEAD WITHOUT CONTRAST CT CERVICAL SPINE WITHOUT CONTRAST TECHNIQUE: Multidetector CT imaging of the head and cervical spine was performed following the standard protocol without intravenous contrast. Multiplanar CT image reconstructions of the cervical spine were also generated. RADIATION DOSE REDUCTION: This exam was performed according to the departmental dose-optimization program which includes  automated exposure control, adjustment of the mA and/or kV according to patient size and/or use of iterative reconstruction technique. COMPARISON:  MRI brain 12/12/2020. CT head and cervical spine 11/20/2020. FINDINGS: CT HEAD FINDINGS Brain: No evidence of acute infarction, hemorrhage, hydrocephalus, extra-axial collection or mass lesion/mass effect. There is moderate periventricular white matter hypodensity, likely chronic small vessel ischemic change. There is mild diffuse atrophy, unchanged. 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 Vascular: No hyperdense vessel or unexpected calcification. Skull: Normal. Negative for fracture or focal lesion. Sinuses/Orbits: There is an air-fluid level in the right sphenoid sinus. Other: None. CT CERVICAL SPINE FINDINGS Alignment: Normal. Skull base and vertebrae: No acute fracture. No primary bone lesion or focal pathologic process. Soft tissues and spinal canal: No prevertebral fluid or swelling. No visible canal hematoma. Disc levels: There is intervertebral disc space narrowing and endplate osteophyte formation throughout the cervical spine most significant at C4-C5, C5-C6 and C6-C7 which is similar to the prior study. No severe central canal or neural foraminal stenosis at any level. Upper chest: Negative. Other: None. IMPRESSION: 1.  No acute intracranial  process. 2. No acute fracture or traumatic subluxation of the cervical spine. 3.  Right sphenoid sinusitis. 4.  Stable diffuse brain atrophy and chronic ischemic change. Electronically Signed   By: Ronney Asters M.D.   On: 07/16/2021 20:36   CT CERVICAL SPINE WO CONTRAST  Result Date: 07/16/2021 CLINICAL DATA:  Trauma.  Fall. EXAM: CT HEAD WITHOUT CONTRAST CT CERVICAL SPINE WITHOUT CONTRAST TECHNIQUE: Multidetector CT imaging of the head and cervical spine was performed following the standard protocol without intravenous contrast. Multiplanar CT image reconstructions of the cervical spine  were also generated. RADIATION DOSE REDUCTION: This exam was performed according to the departmental dose-optimization program which includes automated exposure control, adjustment of the mA and/or kV according to patient size and/or use of iterative reconstruction technique. COMPARISON:  MRI brain 12/12/2020. CT head and cervical spine 11/20/2020. FINDINGS: CT HEAD FINDINGS Brain: No evidence of acute infarction, hemorrhage, hydrocephalus, extra-axial collection or mass lesion/mass effect. There is moderate periventricular white matter hypodensity, likely chronic small vessel ischemic change. There is mild diffuse atrophy, unchanged. 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 Vascular: No hyperdense vessel or unexpected calcification. Skull: Normal. Negative for fracture or focal lesion. Sinuses/Orbits: There is an air-fluid level in the right sphenoid sinus. Other: None. CT CERVICAL SPINE FINDINGS Alignment: Normal. Skull base and vertebrae: No acute fracture. No primary bone lesion or focal pathologic process. Soft tissues and spinal canal: No prevertebral fluid or swelling. No visible canal hematoma. Disc levels: There is intervertebral disc space narrowing and endplate osteophyte formation throughout the cervical spine most significant at C4-C5, C5-C6 and C6-C7 which is similar to the prior study. No severe central canal or neural foraminal stenosis at any level. Upper chest: Negative. Other: None. IMPRESSION: 1.  No acute intracranial process. 2. No acute fracture or traumatic subluxation of the cervical spine. 3.  Right sphenoid sinusitis. 4.  Stable diffuse brain atrophy and chronic ischemic change. Electronically Signed   By: Ronney Asters M.D.   On: 07/16/2021 20:36   DG Pelvis Portable  Result Date: 07/16/2021 CLINICAL DATA:  Level 2 trauma. EXAM: PORTABLE PELVIS 1-2 VIEWS COMPARISON:  Pelvic radiograph dated 11/20/2020. FINDINGS: There is no acute fracture or dislocation. The  bones are osteopenic. There is degenerative changes of the visualized lumbar spine with mild levoscoliosis. The soft tissues are unremarkable. Atherosclerotic calcification of the abdominal aorta and iliac arteries. IMPRESSION: No acute fracture or dislocation. Electronically Signed   By: Anner Crete M.D.   On: 07/16/2021 19:51   DG Chest Port 1 View  Result Date: 07/16/2021 CLINICAL DATA:  Trauma. EXAM: PORTABLE CHEST 1 VIEW COMPARISON:  Chest radiograph dated 05/14/2020. FINDINGS: Mild cardiomegaly and mild vascular congestion. No focal consolidation, pleural effusion, pneumothorax. There is atherosclerotic calcification of the aorta. A loop recorder device is noted. No acute osseous pathology. IMPRESSION: Mild cardiomegaly and mild vascular congestion. Electronically Signed   By: Anner Crete M.D.   On: 07/16/2021 19:50    Assessment & Plan:   Penny Miller was seen today for anemia.  Diagnoses and all orders for this visit:  Flu vaccine need -     Flu Vaccine QUAD High Dose(Fluad)  Benign hypertension  Essential hypertension -     Basic metabolic panel; Future -     TSH; Future -     CBC with Differential/Platelet; Future -     CBC with Differential/Platelet -     TSH -     Basic metabolic panel  Type 2 diabetes mellitus with stage 3a chronic kidney disease, without long-term current use of insulin (HCC) -     Basic metabolic panel; Future -     Hemoglobin A1c; Future -     Hemoglobin A1c -     Basic metabolic panel  Intrinsic eczema -     levocetirizine (XYZAL) 5 MG tablet; Take 1 tablet (5 mg total) by mouth every evening.  Seasonal allergic rhinitis due to pollen -     levocetirizine (XYZAL) 5 MG tablet; Take 1 tablet (5 mg total) by mouth every evening.  Paroxysmal atrial fibrillation (HCC)  Acquired hypothyroidism -     TSH; Future -     TSH  Thrombocytopenia (HCC) -     Vitamin B12; Future -     Folate; Future -     CBC with Differential/Platelet; Future -      CBC with Differential/Platelet -     Folate -     Vitamin B12  Deficiency anemia -     IBC + Ferritin; Future -     Zinc; Future -     Vitamin B1; Future   I have discontinued Gavriella P. Cardon's ondansetron. I am also having her start on levocetirizine. Additionally, I am having her maintain her accu-chek multiclix, Accu-Chek Aviva Plus, hyoscyamine, NONFORMULARY OR COMPOUNDED ITEM, NONFORMULARY OR COMPOUNDED ITEM, triamcinolone cream, acetaminophen, lidocaine, gabapentin, citalopram, amLODipine, apixaban, Nebulizer, atorvastatin, pioglitazone, Vitamin D (Ergocalciferol), and Cyanocobalamin (VITAMIN B 12 PO).  Meds ordered this encounter  Medications   levocetirizine (XYZAL) 5 MG tablet    Sig: Take 1 tablet (5 mg total) by mouth every evening.    Dispense:  90 tablet    Refill:  1     Follow-up: Return in about 6 months (around 07/25/2022).  Scarlette Calico, MD

## 2022-01-25 ENCOUNTER — Ambulatory Visit (INDEPENDENT_AMBULATORY_CARE_PROVIDER_SITE_OTHER): Payer: Medicare Other

## 2022-01-25 ENCOUNTER — Other Ambulatory Visit: Payer: Self-pay | Admitting: Internal Medicine

## 2022-01-25 DIAGNOSIS — E538 Deficiency of other specified B group vitamins: Secondary | ICD-10-CM

## 2022-01-25 DIAGNOSIS — J4540 Moderate persistent asthma, uncomplicated: Secondary | ICD-10-CM

## 2022-01-25 DIAGNOSIS — D539 Nutritional anemia, unspecified: Secondary | ICD-10-CM | POA: Diagnosis not present

## 2022-01-25 LAB — IBC + FERRITIN
Ferritin: 9.4 ng/mL — ABNORMAL LOW (ref 10.0–291.0)
Iron: 134 ug/dL (ref 42–145)
Saturation Ratios: 39.6 % (ref 20.0–50.0)
TIBC: 338.8 ug/dL (ref 250.0–450.0)
Transferrin: 242 mg/dL (ref 212.0–360.0)

## 2022-01-25 MED ORDER — CYANOCOBALAMIN 1000 MCG/ML IJ SOLN
1000.0000 ug | Freq: Once | INTRAMUSCULAR | Status: AC
  Administered 2022-01-25: 1000 ug via INTRAMUSCULAR

## 2022-01-25 NOTE — Progress Notes (Signed)
B12 given.  Pt tolerated well. Pt is aware to give the office a call for an side effects or reactions. Please co-sign.   

## 2022-01-29 ENCOUNTER — Telehealth: Payer: Self-pay | Admitting: Internal Medicine

## 2022-01-29 NOTE — Telephone Encounter (Signed)
Son, Annelise Mccoy, called for test results from lab done on Friday 01/25/22. Specifically the iron Ferritin test results. Concerned about scheduled surgeery in November.

## 2022-01-30 ENCOUNTER — Other Ambulatory Visit: Payer: Self-pay | Admitting: Internal Medicine

## 2022-01-30 DIAGNOSIS — E519 Thiamine deficiency, unspecified: Secondary | ICD-10-CM

## 2022-01-30 DIAGNOSIS — D538 Other specified nutritional anemias: Secondary | ICD-10-CM

## 2022-01-30 LAB — ZINC: Zinc: 59 ug/dL — ABNORMAL LOW (ref 60–130)

## 2022-01-30 LAB — VITAMIN B1: Vitamin B1 (Thiamine): 6 nmol/L — ABNORMAL LOW (ref 8–30)

## 2022-01-30 MED ORDER — VITAMIN B-1 50 MG PO TABS: 50.0000 mg | ORAL_TABLET | Freq: Every day | ORAL | 1 refills | Status: DC

## 2022-01-30 MED ORDER — ZINC GLUCONATE 50 MG PO TABS: 50.0000 mg | ORAL_TABLET | Freq: Every day | ORAL | 1 refills | Status: DC

## 2022-01-30 NOTE — Telephone Encounter (Signed)
Pt's son, Penny Miller, has been informed of results and expressed understanding.

## 2022-01-31 ENCOUNTER — Telehealth: Payer: Self-pay | Admitting: Gastroenterology

## 2022-01-31 NOTE — Telephone Encounter (Signed)
Patient is requesting a refill on her rx omeprazole, she has appt with Dr.Armbruster 11/7 but states she needs it filled now if possible, she was requesting a call back if rx can be filled. Please advise.

## 2022-01-31 NOTE — Telephone Encounter (Signed)
Left VM for patient to return call to Clarify which medication she needed refilled. Omeprazole was not on her medication list.

## 2022-02-01 ENCOUNTER — Other Ambulatory Visit: Payer: Self-pay | Admitting: Gastroenterology

## 2022-02-04 NOTE — Telephone Encounter (Signed)
Patient has an appointment tomorrow. Will address at appt

## 2022-02-05 ENCOUNTER — Encounter: Payer: Self-pay | Admitting: Gastroenterology

## 2022-02-05 ENCOUNTER — Ambulatory Visit (INDEPENDENT_AMBULATORY_CARE_PROVIDER_SITE_OTHER): Payer: Medicare Other | Admitting: Gastroenterology

## 2022-02-05 VITALS — BP 170/70 | HR 65 | Ht 66.0 in | Wt 160.0 lb

## 2022-02-05 DIAGNOSIS — Z79899 Other long term (current) drug therapy: Secondary | ICD-10-CM | POA: Diagnosis not present

## 2022-02-05 DIAGNOSIS — K5520 Angiodysplasia of colon without hemorrhage: Secondary | ICD-10-CM

## 2022-02-05 DIAGNOSIS — D649 Anemia, unspecified: Secondary | ICD-10-CM

## 2022-02-05 DIAGNOSIS — I63442 Cerebral infarction due to embolism of left cerebellar artery: Secondary | ICD-10-CM | POA: Diagnosis not present

## 2022-02-05 DIAGNOSIS — E538 Deficiency of other specified B group vitamins: Secondary | ICD-10-CM

## 2022-02-05 DIAGNOSIS — D509 Iron deficiency anemia, unspecified: Secondary | ICD-10-CM

## 2022-02-05 DIAGNOSIS — K219 Gastro-esophageal reflux disease without esophagitis: Secondary | ICD-10-CM

## 2022-02-05 MED ORDER — FAMOTIDINE 20 MG PO TABS: ORAL_TABLET | ORAL | 0 refills | Status: DC

## 2022-02-05 MED ORDER — FERROUS SULFATE 325 (65 FE) MG PO TABS: 325.0000 mg | ORAL_TABLET | Freq: Two times a day (BID) | ORAL | 1 refills | Status: DC

## 2022-02-05 MED ORDER — FAMOTIDINE 20 MG PO TABS: ORAL_TABLET | ORAL | 3 refills | Status: DC

## 2022-02-05 NOTE — Progress Notes (Signed)
HPI :  88 year old female with a history of iron deficiency anemia, colonic AVMs, diverticulosis, A-fib with history of CVA on chronic Eliquis, symptomatic bradycardia pending pacemaker placement, here for follow-up visit.  I last saw her in January 2022.  Recall I saw her a few years ago for iron deficiency anemia.  She underwent an EGD and colonoscopy with me as outlined below.  She did not have any concerning pathology on the EGD.  On colonoscopy she had large colonic AVMs that I thought was the source of her iron deficiency in the setting of anticoagulation.  She also had significant diverticulosis in the sigmoid colon along with inflammatory changes which I suspect was reactive to diverticulitis or diverticular disease.  Biopsies were obtained and benign.  She has been on oral iron and had had a stable hemoglobin over time.  Her hemoglobins been ranging between 11.5 and 13 over the past 2 years or so and been stable.  She had routine labs checked on October 25 and her hemoglobin decreased to 8.2.  Her MCV was 106 at that lab check.  Dr. Ronnald Ramp sent additional labs and she was found to have B12 deficiency, thiamine deficiency, zinc deficiency, and mild iron deficiency.  Her total iron and iron saturation are normal but her ferritin was 9.4.  She has since been put on a B12 shot and niacin/zinc supplementation.  She really denies any blood in her stools.  She has had altered bowels in the past with constipation and loose stools, roughly 50-50 each.  We had placed her on Citrucel in the past and she states that generally helps keep her fairly regular.  She is not seeing any blood in her stool at baseline.  She reminds me she had been on omeprazole for history of reflux in the past.  When she has run out of this in the past she has had recurrent heartburn that bothered her.  She has not tried Pepcid in some time.    She is otherwise followed by cardiology, on Eliquis for history of A-fib.  She has had  some episodes of symptomatic bradycardia associated with dyspnea and she is scheduled to have a pacemaker placed in a few weeks for this issue.  Otherwise denies other complaints today.  No abdominal pains that bother her on a routine basis.  She had a CT scan of her chest abdomen pelvis back in April when she presented to the ED with a fall.  Diverticulosis noted but no diverticulitis or concerning colonic lesions.  Prior workup:  EGD 12/16/18 - - Localized mucosal change, around 109m in diameter, were found in the middle third of the esophagus around 26cm of the incisors. Biopsies show glycogenic acanthosis. The exam of the esophagus was otherwise normal. Gastritis, negative biopsies for HP. A large diverticulum was found in the second portion of the duodenum. The exam of the duodenum was otherwise normal.   Colonoscopy 12/16/18 - Four non-bleeding colonic angiodysplastic lesions, one of them quite large at the IC valve. One 5 mm polyp in the descending colon. Diverticulosis in the sigmoid colon. Friability in the sigmoid colon with inflammatory changes - I suspect reactive (? diverticilitis /diverticular disease). Biopsied. Tattooed. - One 3 mm polyp at the recto-sigmoid colon. The examination was otherwise normal.   1. Surgical [P], gastric antrum and gastric body - ANTRAL AND OXYNTIC MUCOSA WITH SLIGHT CHRONIC INFLAMMATION. -[place] DyerNEGATIVE FOR HELICOBACTER PYLORI. - NO INTESTINAL METAPLASIA, DYSPLASIA OR MALIGNANCY. 2. Surgical [P], esophagus biopsies  at [place]. - NO DYSPLASIA OR MALIGNANCY. 3. Surgical [P], colon, rectosigmoid, descending, polyp (2) - TUBULAR ADENOMA(S). - NO HIGH GRADE DYSPLASIA OR MALIGNANCY. 4. Surgical [P], colon, sigmoid - COLONIC MUCOSA WITH HYPEREMIA AND EDEMA. - NO ACTIVE INFLAMMATION, GRANULOMAS OR ADENOMATOUS CHANGE. - DIFFERENTIAL INCLUDES DIVERTICULITIS RELATED CHANGES.     CT Scan  - 02/05/19 - IMPRESSION: 1. No explanation for patient's left lower quadrant pain status post colonoscopy. 2.  Aortic Atherosclerosis (ICD10-I70.0). 3. Infrarenal abdominal aortic ectasia. Ectatic abdominal aorta at risk for aneurysm development. Recommend followup by ultrasound in 5 years. This recommendation follows ACR consensus guidelines: White Paper of the ACR Incidental Findings Committee II on Vascular Findings. J Am Coll Radiol 2013; 10:789-794. 4. Advanced lumbar spondylosis noted.   CT abdomen / pelvis 07/16/21: MPRESSION: No acute traumatic injury to the chest, abdomen, or pelvis. Acute T12 compression fracture versus incomplete burst fracture with 2 mm retropulsion into the central canal.  No acute fracture or traumatic malalignment of the lumbar spine. Scattered colonic diverticulosis with no acute diverticulitis. Partially visualized lipomatous lesion along the proximal left thigh musculature     Past Medical History:  Diagnosis Date   Allergic rhinitis, cause unspecified    Anxiety state, unspecified    Arthritis    Colon polyp    Diaphragmatic hernia without mention of obstruction or gangrene    Diverticulosis of colon (without mention of hemorrhage)    Female stress incontinence    HTN (hypertension)    Insomnia, unspecified    Interstitial cystitis    surgery 02/01/2013   Irritable bowel syndrome    Memory loss    Other and unspecified hyperlipidemia    Other premature beats    Polyneuropathy in diabetes(357.2)    Postmenopausal bleeding    Proteinuria    Rectal ulcer    Skin cancer    Stroke ([place]) 2018   Type II or unspecified type diabetes mellitus with neurological manifestations, not stated as uncontrolled(250.60)    Unspecified essential hypertension    Viral warts, unspecified      Past Surgical History:  Procedure Laterality Date   ABDOMINAL HYSTERECTOMY  1977   [place]   CATARACT EXTRACTION,  BILATERAL  02/10/2006   Dr.Groat    COLONOSCOPY  07/17/2004   polypectomy   CYSTOSCOPY  1987   Dr.Bradley   LOOP RECORDER INSERTION N/A 07/31/2016   Procedure: Loop Recorder Insertion;  Surgeon: [name], MD;  Location: [place] [facility];  Service: Cardiovascular;  Laterality: N/A;   TEE WITHOUT CARDIOVERSION N/A 07/30/2016   Procedure: TRANSESOPHAGEAL ECHOCARDIOGRAM (TEE);  Surgeon: [name], MD;  Location: [facility];  Service: Cardiovascular;  Laterality: N/A;   [place]  05/13/16   urinary bladder      surgery 02/01/2013 to stretch bladder stem   Family History  Problem Relation Age of Onset   Pancreatic cancer Mother 56   Heart attack Father    Bladder Cancer Father    Hypertension Sister    Diabetes Sister    COPD Sister    Hypertension Son    Diabetes Brother    Heart disease Brother    Hypertension Brother    Diabetes Brother    Heart disease Brother 47       Myocardial Infarction    Heart attack Brother    Social History  Tobacco Use   Smoking status: Former    Packs/day: 0.25    Years: 62.00    Total pack years: 15.50    Types: Cigarettes    Quit date: 04/02/2011    Years since quitting: 10.8   Smokeless tobacco: Never   Tobacco comments:    stopped consistently smoking in 2012 but still smokes occasionally (one pack per week)  Vaping Use   Vaping Use: Never used  Substance Use Topics   Alcohol use: No   Drug use: No   Current Outpatient Medications  Medication Sig Dispense Refill   ACCU-CHEK AVIVA PLUS test strip USE TO TEST BLOOD SUGAR TWICE DAILY. 100 each 11   acetaminophen (TYLENOL) 500 MG tablet Take 500 mg by mouth every 6 (six) hours as needed (pain).     amLODipine (NORVASC) 2.5 MG tablet Take 2.5 mg by mouth at bedtime.     apixaban (ELIQUIS) 2.5 MG TABS tablet Take 1 tablet (2.5 mg total) by mouth 2 (two) times daily. 180 tablet 0   atorvastatin (LIPITOR) 40 MG tablet TAKE 1  TABLET AT BEDTIME 90 tablet 1   budesonide (PULMICORT) 0.5 MG/2ML nebulizer solution USE 1 VIAL  IN  NEBULIZER TWICE  DAILY - - Rinse Mouth After Treatment 60 mL 5   citalopram (CELEXA) 10 MG tablet TAKE 1 TABLET DAILY 90 tablet 1   Cyanocobalamin (VITAMIN B 12 PO) Take by mouth.     gabapentin (NEURONTIN) 300 MG capsule Take 1 capsule (300 mg total) by mouth 3 (three) times daily. 90 capsule 1   hyoscyamine (LEVSIN SL) 0.125 MG SL tablet Place 1 tablet (0.125 mg total) under the tongue every 4 (four) hours as needed. Take tablet every 4-6 hours for abdominal pain and cramping 15 tablet 2   ipratropium-albuterol (DUONEB) 0.5-2.5 (3) MG/3ML SOLN USE 1 VIAL IN NEBULIZER EVERY 6 HOURS - And As Needed 120 mL 5   Lancets (ACCU-CHEK MULTICLIX) lancets Use to test blood sugar twice daily. E11.29 100 each 11   levocetirizine (XYZAL) 5 MG tablet Take 1 tablet (5 mg total) by mouth every evening. 90 tablet 1   lidocaine (LIDODERM) 5 % Place 1 patch onto the skin daily. Remove & Discard patch within 12 hours or as directed by MD 60 patch 1   Nebulizer MISC 1 Act by Does not apply route daily. 1 each 2   NONFORMULARY OR COMPOUNDED ITEM Transport chair   #1    dx leg and hip pain s/p fall 1 each 0   NONFORMULARY OR COMPOUNDED ITEM Rollator walker with seat    #1   dx gait abnormality 1 each 0   omeprazole (PRILOSEC) 20 MG capsule Take 1 capsule (20 mg total) by mouth 2 (two) times daily before a meal. Please keep your November appointment for further refills 60 capsule 0   thiamine (VITAMIN B-1) 50 MG tablet Take 1 tablet (50 mg total) by mouth daily. 90 tablet 1   triamcinolone cream (KENALOG) 0.5 % APPLY 1 APPLICATION TOPICALLY TWICE A DAY 450 g 3   Vitamin D, Ergocalciferol, (DRISDOL) 1.25 MG (50000 UNIT) CAPS capsule TAKE 1 CAPSULE EVERY 7 DAYS 12 capsule 0   zinc gluconate 50 MG tablet Take 1 tablet (50 mg total) by mouth daily. 90 tablet 1   No current facility-administered medications for this visit.    Allergies  Allergen Reactions   Jardiance [Empagliflozin] Diarrhea   Metformin And Related Diarrhea   Codeine Nausea And Vomiting   Metoprolol  Nausea And Vomiting   Relafen [Nabumetone] Nausea And Vomiting   Septra [Sulfamethoxazole-Trimethoprim] Nausea And Vomiting   Tetracycline Nausea And Vomiting   Adhesive [Tape] Rash and Other (See Comments)    Tears the skin also   Ceclor [Cefaclor] Other (See Comments)    Unknown reaction; not recalled   Cefaclor Other (See Comments)    Unknown reaction   Codeine Nausea And Vomiting   Jardiance [Empagliflozin] Diarrhea   Lopid [Gemfibrozil] Other (See Comments)    Unknown reaction; not recalled   Lopid [Gemfibrozil] Other (See Comments)    Unknown reaction   Lopressor [Metoprolol Tartrate] Nausea And Vomiting   Metformin And Related Diarrhea   Morphine And Related Other (See Comments)    Passed out   Morphine And Related Other (See Comments)    Passed out   Niacin Nausea And Vomiting    Unknown reaction Unknown reaction; not recalled   Niacin And Related Other (See Comments)    Unknown reaction; not recalled   Niacin And Related Other (See Comments)    Unknown reaction   Relafen [Nabumetone] Nausea And Vomiting   Septra [Sulfamethoxazole-Trimethoprim] Nausea And Vomiting   Tape Rash and Other (See Comments)    Tears skin   Tetracyclines & Related Nausea And Vomiting   Toprol Xl [Metoprolol Tartrate] Nausea And Vomiting   Toprol Xl [Metoprolol] Nausea And Vomiting     Review of Systems: All systems reviewed and negative except where noted in HPI.   Lab Results  Component Value Date   WBC 7.2 01/23/2022   HGB 8.2 Repeated and verified X2. (L) 01/23/2022   HCT 25.4 (L) 01/23/2022   MCV 106.4 (H) 01/23/2022   PLT 183.0 01/23/2022       Latest Ref Rng & Units 01/23/2022   11:23 AM 07/16/2021    7:51 PM 07/16/2021    7:44 PM  CBC  WBC 4.0 - 10.5 K/uL 7.2   9.1   Hemoglobin 12.0 - 15.0 g/dL 8.2 Repeated and verified  X2.  13.3  13.3   Hematocrit 36.0 - 46.0 % 25.4  39.0  40.3   Platelets 150.0 - 400.0 K/uL 183.0   146     Lab Results  Component Value Date   CREATININE 1.22 (H) 01/23/2022   BUN 23 01/23/2022   NA 138 01/23/2022   K 4.3 01/23/2022   CL 104 01/23/2022   CO2 28 01/23/2022    Lab Results  Component Value Date   ALT 13 07/16/2021   AST 14 (L) 07/16/2021   ALKPHOS 70 07/16/2021   BILITOT 0.5 07/16/2021     Physical Exam: BP (!) 170/70   Pulse 65   Ht '5\' 6"'$  (1.676 m)   Wt 160 lb (72.6 kg)   BMI 25.82 kg/m  Constitutional: Pleasant,well-developed, female in no acute distress. Neurological: Alert and oriented to person place and time. Psychiatric: Normal mood and affect. Behavior is normal.   ASSESSMENT: 88 y.o. female here for assessment of the following  1. Iron deficiency anemia, unspecified iron deficiency anemia type   2. B12 deficiency   3. Anemia, unspecified type   4. AVM (arteriovenous malformation) of colon   5. Gastroesophageal reflux disease, unspecified whether esophagitis present   6. Long-term current use of proton pump inhibitor therapy    As above, patient with history of iron deficiency in the setting of anticoagulation likely due to large colonic AVMs.  She has not had any overt bleeding and hemoglobin had been stable since this  was diagnosed on oral iron.  Over the past 6 months she has had a clear drop in her hemoglobin, however MCV is quite elevated, seen B12 deficiency as well as zinc and thiamine, more likely the cause of her anemia rather than iron deficiency albeit her ferritin is slightly low.  Recommended increasing her iron supplementation to twice daily dosing and continue the rest of her vitamin supplements per primary care, especially B12.  Unclear cause of the B12 deficiency, she is on chronic PPI which could be related, I will stop her omeprazole at this point time and switch her to Pepcid 20 mg once to twice daily to see if that will help along  with her B12 supplementation.  Given her age and comorbidities, pacemaker pending, I do not feel that we need to pursue EGD or colonoscopy if she is not having any overt bleeding.  She has had imaging within the past year showing no concerning process in her abdomen.  She does have some significant diverticulosis and I suspect inflammatory changes there on prior colonoscopy.  She agrees with the plan as outlined.  She has questions about follow-up labs and timing of that, I will talk with Dr. Ronnald Ramp about that and her B12 supplementation.  PLAN: - continue vitamin supplements as outlined - will touch base with Dr. Ronnald Ramp about B12 supp and timing of her follow up labs prior to pacemaker placement - increase oral iron to BID - continue Citrucel daily - stop prilosec '20mg'$  in case related to B12 deficiency - switch to pepcid '20mg'$  once or twice daily, if this does not control symptoms she will let us know - holding off on colonoscopy/ EGD - I think GI blood loss unlikely to be driving her anemia, likely no further procedures from our end based on age and comorbidities. Would not recommend endoscopic treatment of AVMs as long as no overt bleeding and iron supplementation is sufficient.  Jolly Mango, MD [facility]

## 2022-02-05 NOTE — Patient Instructions (Addendum)
_______________________________________________________  If you are age 86 or older, your body mass index should be between 23-30. Your Body mass index is 25.82 kg/m. If this is out of the aforementioned range listed, please consider follow up with your Primary Care Provider.  If you are age 39 or younger, your body mass index should be between 19-25. Your Body mass index is 25.82 kg/m. If this is out of the aformentioned range listed, please consider follow up with your Primary Care Provider.   ________________________________________________________  The [facility] providers would like to encourage you to use [facility] to communicate with providers for non-urgent requests or questions.  Due to long hold times on the telephone, sending your provider a message by [facility] may be a faster and more efficient way to get a response.  Please allow 48 business hours for a response.  Please remember that this is for non-urgent requests.  _______________________________________________________  Stop Prilosec  We have sent the following medications to your pharmacy for you to pick up at your convenience: Pepcid 20 OR:VIFB once to twice daily  Increase oral iron to twice daily  Continue B12.  Thank you for entrusting me with your care and for choosing [facility], Dr. [place]

## 2022-02-08 ENCOUNTER — Other Ambulatory Visit: Payer: Self-pay | Admitting: Internal Medicine

## 2022-02-08 DIAGNOSIS — N1831 Type 2 diabetes mellitus with diabetic chronic kidney disease: Secondary | ICD-10-CM

## 2022-02-18 ENCOUNTER — Other Ambulatory Visit: Payer: Self-pay | Admitting: Internal Medicine

## 2022-02-18 DIAGNOSIS — F325 Major depressive disorder, single episode, in full remission: Secondary | ICD-10-CM

## 2022-02-19 NOTE — Progress Notes (Unsigned)
Subjective:    Patient ID: Penny Miller, female    DOB: Feb 26, 1932, 88 y.o.   MRN: [id number]      HPI Kay is here for No chief complaint on file.    Feet and leg swelling -   Saw Dr Ronnald Ramp 01/23/22 - complained of SOB, LE edema.  Had 2+ b/l edema.    Echo 10/2018:  EF 53-61%, LV diastolic doppler with pseudonormalization.  normal RV function, moderate pulm htn.    Medications and allergies reviewed with patient and updated if appropriate.  Current Outpatient Medications on File Prior to Visit  Medication Sig Dispense Refill   ACCU-CHEK AVIVA PLUS test strip USE TO TEST BLOOD SUGAR TWICE DAILY. 100 each 11   acetaminophen (TYLENOL) 500 MG tablet Take 500 mg by mouth every 6 (six) hours as needed (pain).     amLODipine (NORVASC) 2.5 MG tablet Take 2.5 mg by mouth at bedtime.     apixaban (ELIQUIS) 2.5 MG TABS tablet Take 1 tablet (2.5 mg total) by mouth 2 (two) times daily. 180 tablet 0   atorvastatin (LIPITOR) 40 MG tablet TAKE 1 TABLET AT BEDTIME 90 tablet 1   budesonide (PULMICORT) 0.5 MG/2ML nebulizer solution USE 1 VIAL  IN  NEBULIZER TWICE  DAILY - - Rinse Mouth After Treatment 60 mL 5   citalopram (CELEXA) 10 MG tablet TAKE 1 TABLET DAILY 90 tablet 1   Cyanocobalamin (VITAMIN B 12 PO) Take by mouth.     famotidine (PEPCID) 20 MG tablet Take one tablet once to twice a day 60 tablet 0   famotidine (PEPCID) 20 MG tablet Take one tablet once to twice daily 180 tablet 3   ferrous sulfate 325 (65 FE) MG tablet Take 1 tablet (325 mg total) by mouth 2 (two) times daily with a meal. 180 tablet 1   gabapentin (NEURONTIN) 300 MG capsule Take 1 capsule (300 mg total) by mouth 3 (three) times daily. 90 capsule 1   hyoscyamine (LEVSIN SL) 0.125 MG SL tablet Place 1 tablet (0.125 mg total) under the tongue every 4 (four) hours as needed. Take tablet every 4-6 hours for abdominal pain and cramping 15 tablet 2   ipratropium-albuterol (DUONEB) 0.5-2.5 (3) MG/3ML SOLN USE 1 VIAL IN  NEBULIZER EVERY 6 HOURS - And As Needed 120 mL 5   Lancets (ACCU-CHEK MULTICLIX) lancets Use to test blood sugar twice daily. E11.29 100 each 11   levocetirizine (XYZAL) 5 MG tablet Take 1 tablet (5 mg total) by mouth every evening. 90 tablet 1   lidocaine (LIDODERM) 5 % Place 1 patch onto the skin daily. Remove & Discard patch within 12 hours or as directed by MD 60 patch 1   Nebulizer MISC 1 Act by Does not apply route daily. 1 each 2   NONFORMULARY OR COMPOUNDED ITEM Transport chair   #1    dx leg and hip pain s/p fall 1 each 0   NONFORMULARY OR COMPOUNDED ITEM Rollator walker with seat    #1   dx gait abnormality 1 each 0   thiamine (VITAMIN B-1) 50 MG tablet Take 1 tablet (50 mg total) by mouth daily. 90 tablet 1   triamcinolone cream (KENALOG) 0.5 % APPLY 1 APPLICATION TOPICALLY TWICE A DAY 450 g 3   Vitamin D, Ergocalciferol, (DRISDOL) 1.25 MG (50000 UNIT) CAPS capsule TAKE 1 CAPSULE EVERY 7 DAYS 12 capsule 0   zinc gluconate 50 MG tablet Take 1 tablet (50 mg total) by mouth daily.  90 tablet 1   No current facility-administered medications on file prior to visit.    Review of Systems     Objective:  There were no vitals filed for this visit. BP Readings from Last 3 Encounters:  02/05/22 (!) 170/70  01/23/22 128/60  01/17/22 (!) 132/58   Wt Readings from Last 3 Encounters:  02/05/22 160 lb (72.6 kg)  01/23/22 158 lb (71.7 kg)  01/17/22 155 lb (70.3 kg)   There is no height or weight on file to calculate BMI.    Physical Exam         Assessment & Plan:    See Problem List for Assessment and Plan of chronic medical problems.

## 2022-02-20 ENCOUNTER — Ambulatory Visit (INDEPENDENT_AMBULATORY_CARE_PROVIDER_SITE_OTHER): Payer: Medicare Other

## 2022-02-20 ENCOUNTER — Ambulatory Visit (INDEPENDENT_AMBULATORY_CARE_PROVIDER_SITE_OTHER): Payer: Medicare Other | Admitting: Internal Medicine

## 2022-02-20 ENCOUNTER — Telehealth: Payer: Self-pay | Admitting: Internal Medicine

## 2022-02-20 ENCOUNTER — Encounter: Payer: Self-pay | Admitting: Internal Medicine

## 2022-02-20 VITALS — BP 128/78 | HR 66 | Temp 98.0°F | Ht 66.0 in | Wt 161.0 lb

## 2022-02-20 DIAGNOSIS — R062 Wheezing: Secondary | ICD-10-CM

## 2022-02-20 DIAGNOSIS — R0602 Shortness of breath: Secondary | ICD-10-CM | POA: Diagnosis not present

## 2022-02-20 DIAGNOSIS — I63442 Cerebral infarction due to embolism of left cerebellar artery: Secondary | ICD-10-CM | POA: Diagnosis not present

## 2022-02-20 DIAGNOSIS — R0609 Other forms of dyspnea: Secondary | ICD-10-CM

## 2022-02-20 DIAGNOSIS — R600 Localized edema: Secondary | ICD-10-CM

## 2022-02-20 DIAGNOSIS — D519 Vitamin B12 deficiency anemia, unspecified: Secondary | ICD-10-CM | POA: Diagnosis not present

## 2022-02-20 DIAGNOSIS — J90 Pleural effusion, not elsewhere classified: Secondary | ICD-10-CM | POA: Diagnosis not present

## 2022-02-20 DIAGNOSIS — R9389 Abnormal findings on diagnostic imaging of other specified body structures: Secondary | ICD-10-CM | POA: Diagnosis not present

## 2022-02-20 DIAGNOSIS — J811 Chronic pulmonary edema: Secondary | ICD-10-CM | POA: Diagnosis not present

## 2022-02-20 LAB — COMPREHENSIVE METABOLIC PANEL
ALT: 9 U/L (ref 0–35)
AST: 13 U/L (ref 0–37)
Albumin: 4 g/dL (ref 3.5–5.2)
Alkaline Phosphatase: 79 U/L (ref 39–117)
BUN: 17 mg/dL (ref 6–23)
CO2: 31 mEq/L (ref 19–32)
Calcium: 9.7 mg/dL (ref 8.4–10.5)
Chloride: 103 mEq/L (ref 96–112)
Creatinine, Ser: 1.4 mg/dL — ABNORMAL HIGH (ref 0.40–1.20)
GFR: 33.61 mL/min — ABNORMAL LOW (ref >60.00)
Glucose, Bld: 137 mg/dL — ABNORMAL HIGH (ref 70–99)
Potassium: 3.9 mEq/L (ref 3.5–5.1)
Sodium: 140 mEq/L (ref 135–145)
Total Bilirubin: 0.4 mg/dL (ref 0.2–1.2)
Total Protein: 6.5 g/dL (ref 6.0–8.3)

## 2022-02-20 LAB — CBC WITH DIFFERENTIAL/PLATELET
Basophils Absolute: 0.1 10*3/uL (ref 0.0–0.1)
Basophils Relative: 0.8 % (ref 0.0–3.0)
Eosinophils Absolute: 0.1 10*3/uL (ref 0.0–0.7)
Eosinophils Relative: 1.9 % (ref 0.0–5.0)
HCT: 32.8 % — ABNORMAL LOW (ref 36.0–46.0)
Hemoglobin: 10.9 g/dL — ABNORMAL LOW (ref 12.0–15.0)
Lymphocytes Relative: 11.8 % — ABNORMAL LOW (ref 12.0–46.0)
Lymphs Abs: 0.9 10*3/uL (ref 0.7–4.0)
MCHC: 33.3 g/dL (ref 30.0–36.0)
MCV: 101.9 fl — ABNORMAL HIGH (ref 78.0–100.0)
Monocytes Absolute: 0.8 10*3/uL (ref 0.1–1.0)
Monocytes Relative: 10.6 % (ref 3.0–12.0)
Neutro Abs: 5.7 10*3/uL (ref 1.4–7.7)
Neutrophils Relative %: 74.9 % (ref 43.0–77.0)
Platelets: 219 10*3/uL (ref 150.0–400.0)
RBC: 3.22 Mil/uL — ABNORMAL LOW (ref 3.87–5.11)
RDW: 14.1 % (ref 11.5–15.5)
WBC: 7.6 10*3/uL (ref 4.0–10.5)

## 2022-02-20 LAB — BRAIN NATRIURETIC PEPTIDE: Pro B Natriuretic peptide (BNP): 1012 pg/mL — ABNORMAL HIGH (ref 0.0–100.0)

## 2022-02-20 MED ORDER — FLUTICASONE-SALMETEROL 250-50 MCG/ACT IN AEPB: 1.0000 | INHALATION_SPRAY | Freq: Two times a day (BID) | RESPIRATORY_TRACT | 1 refills | Status: DC

## 2022-02-20 MED ORDER — FUROSEMIDE 20 MG PO TABS: 20.0000 mg | ORAL_TABLET | Freq: Every day | ORAL | 0 refills | Status: DC

## 2022-02-20 NOTE — Telephone Encounter (Signed)
Spoke with son [name] and info given.  Appointment made for Monday.

## 2022-02-20 NOTE — Telephone Encounter (Signed)
Pt's procedure has been cancelled. LM with her Son to call back when she is feeling better and we will get her rescheduled.

## 2022-02-20 NOTE — Telephone Encounter (Signed)
Patient's son states the patient has been sick for the past few weeks. They are currently at PCP office and PCP advised to reschedule 11/27 procedure.

## 2022-02-20 NOTE — Patient Instructions (Addendum)
      B12 injection today   Blood work was ordered.   The lab is on the first floor. Have a Chest xray downstairs.     Medications changes include :   lasix 20 mg daily x 3 days.  Start B12 1000 mcg daily.

## 2022-02-20 NOTE — Telephone Encounter (Signed)
Paitient has been sick and is scheduled for some upcoming tests. Her PCP advised that she shouldn't have procedure with [name] next week and should reschedule. Advised patient's son that April will call him back later this afternoon to reschedule patient's procedure.

## 2022-02-20 NOTE — Telephone Encounter (Signed)
Call her and let her know chest x-ray shows that she is fluid overloaded and her blood test also states that.  Have her take the Lasix every day and come back to see me on Monday.

## 2022-02-24 NOTE — Progress Notes (Signed)
Subjective:    Patient ID: Penny Miller, female    DOB: [birth year], 88 y.o.   MRN: [id number]     HPI Penny Miller is here for follow up for her acute on chronic HF versus pulmonary hypertension-fluid overload.  I saw her last week - was SOB, wheezing, had orthopnea and Bl LE edema.  Cxr with vascular congestion, BNP 1012, GFR 33.61.  started her on lasix 20 mg daily x 3 days.  Anemia was better.     Swelling is worse.  She is still having shortness of breath and wheezing.  Her son states that her symptoms did improve while taking the Lasix.  Symptoms seem worse today.    Medications and allergies reviewed with patient and updated if appropriate.  Current Outpatient Medications on File Prior to Visit  Medication Sig Dispense Refill   ACCU-CHEK AVIVA PLUS test strip USE TO TEST BLOOD SUGAR TWICE DAILY. 100 each 11   acetaminophen (TYLENOL) 500 MG tablet Take 500 mg by mouth every 6 (six) hours as needed (pain).     amLODipine (NORVASC) 2.5 MG tablet Take 2.5 mg by mouth at bedtime.     apixaban (ELIQUIS) 2.5 MG TABS tablet Take 1 tablet (2.5 mg total) by mouth 2 (two) times daily. 180 tablet 0   atorvastatin (LIPITOR) 40 MG tablet TAKE 1 TABLET AT BEDTIME 90 tablet 1   budesonide (PULMICORT) 0.5 MG/2ML nebulizer solution USE 1 VIAL  IN  NEBULIZER TWICE  DAILY - - Rinse Mouth After Treatment 60 mL 5   citalopram (CELEXA) 10 MG tablet TAKE 1 TABLET DAILY 90 tablet 1   Cyanocobalamin (VITAMIN B 12 PO) Take by mouth.     famotidine (PEPCID) 20 MG tablet Take one tablet once to twice a day 60 tablet 0   ferrous sulfate 325 (65 FE) MG tablet Take 1 tablet (325 mg total) by mouth 2 (two) times daily with a meal. 180 tablet 1   fluticasone-salmeterol (ADVAIR) 250-50 MCG/ACT AEPB Inhale 1 puff into the lungs in the morning and at bedtime. 60 each 1   gabapentin (NEURONTIN) 300 MG capsule Take 1 capsule (300 mg total) by mouth 3 (three) times daily. 90 capsule 1   hyoscyamine (LEVSIN SL)  0.125 MG SL tablet Place 1 tablet (0.125 mg total) under the tongue every 4 (four) hours as needed. Take tablet every 4-6 hours for abdominal pain and cramping 15 tablet 2   ipratropium-albuterol (DUONEB) 0.5-2.5 (3) MG/3ML SOLN USE 1 VIAL IN NEBULIZER EVERY 6 HOURS - And As Needed 120 mL 5   Lancets (ACCU-CHEK MULTICLIX) lancets Use to test blood sugar twice daily. E11.29 100 each 11   levocetirizine (XYZAL) 5 MG tablet Take 1 tablet (5 mg total) by mouth every evening. 90 tablet 1   lidocaine (LIDODERM) 5 % Place 1 patch onto the skin daily. Remove & Discard patch within 12 hours or as directed by MD 60 patch 1   Nebulizer MISC 1 Act by Does not apply route daily. 1 each 2   NONFORMULARY OR COMPOUNDED ITEM Transport chair   #1    dx leg and hip pain s/p fall 1 each 0   NONFORMULARY OR COMPOUNDED ITEM Rollator walker with seat    #1   dx gait abnormality 1 each 0   thiamine (VITAMIN B-1) 50 MG tablet Take 1 tablet (50 mg total) by mouth daily. 90 tablet 1   triamcinolone cream (KENALOG) 0.5 % APPLY 1 APPLICATION TOPICALLY  TWICE A DAY 450 g 3   Vitamin D, Ergocalciferol, (DRISDOL) 1.25 MG (50000 UNIT) CAPS capsule TAKE 1 CAPSULE EVERY 7 DAYS 12 capsule 0   zinc gluconate 50 MG tablet Take 1 tablet (50 mg total) by mouth daily. 90 tablet 1   No current facility-administered medications on file prior to visit.     Review of Systems  Constitutional:  Positive for fatigue. Negative for fever.  Respiratory:  Positive for shortness of breath and wheezing. Negative for cough.   Cardiovascular:  Positive for leg swelling. Negative for chest pain and palpitations.  Neurological:  Negative for light-headedness and headaches.       Objective:   Vitals:   02/25/22 1344  BP: 136/74  Pulse: 78  Temp: 98 F (36.7 C)  SpO2: (!) 85%   BP Readings from Last 3 Encounters:  02/25/22 136/74  02/20/22 128/78  02/05/22 (!) 170/70   Wt Readings from Last 3 Encounters:  02/25/22 161 lb 3.2 oz (73.1  kg)  02/20/22 161 lb (73 kg)  02/05/22 160 lb (72.6 kg)   Body mass index is 26.02 kg/m.    Physical Exam Constitutional:      General: She is not in acute distress.    Appearance: Normal appearance. She is not ill-appearing.  HENT:     Head: Normocephalic and atraumatic.  Cardiovascular:     Rate and Rhythm: Normal rate and regular rhythm.  Pulmonary:     Effort: Pulmonary effort is normal. No respiratory distress.     Breath sounds: No wheezing or rales.  Musculoskeletal:     Right lower leg: Edema (1+ pitting entire lower leg) present.     Left lower leg: Edema (1+ pitting entire lower leg) present.  Skin:    General: Skin is warm and dry.  Neurological:     Mental Status: She is alert.        Lab Results  Component Value Date   WBC 7.6 02/20/2022   HGB 10.9 (L) 02/20/2022   HCT 32.8 (L) 02/20/2022   PLT 219.0 02/20/2022   GLUCOSE 137 (H) 02/20/2022   CHOL 166 05/28/2021   TRIG 192.0 (H) 05/28/2021   HDL 44.10 05/28/2021   LDLCALC 84 05/28/2021   ALT 9 02/20/2022   AST 13 02/20/2022   NA 140 02/20/2022   K 3.9 02/20/2022   CL 103 02/20/2022   CREATININE 1.40 (H) 02/20/2022   BUN 17 02/20/2022   CO2 31 02/20/2022   TSH 3.52 01/23/2022   INR 1.2 07/16/2021   HGBA1C 4.9 01/23/2022   MICROALBUR 9.7 (H) 05/28/2021     Assessment & Plan:    Fluid overload, pulmonary hypertension: Subacute She continues to be fluid overloaded-low-dose Lasix did help, but symptoms have worsened since stopping Likely related to pulmonary hypertension, possible diastolic heart failure in addition to CKD Start Lasix 40 mg daily x 3 days-advised to take potassium 20 mill equivalents daily for those 3 days.  Then Lasix 20 mg daily BMP in 3-4 days Will refer to Dr. Percival Miller whom she has seen in the past Sees Dr. Joylene Miller next month at some point  Wheezing, shortness of breath: Likely related to fluid overload, but may also have a component of asthma Did not try the  Advair-advised her to try this to see if that helps and if it does not she does not have to continue to take it

## 2022-02-24 NOTE — Patient Instructions (Addendum)
      Blood work was ordered.   Have this done on Thursday or Friday    Medications changes include :   lasix 40 mg daily + potassium once a day x 3 days then take lasix 20 mg daily    A referral was ordered for cardiology - Dr Percival Spanish who you have seen previously - he is a general cardiologist.

## 2022-02-25 ENCOUNTER — Encounter (HOSPITAL_COMMUNITY): Admission: RE | Payer: Self-pay | Source: Home / Self Care

## 2022-02-25 ENCOUNTER — Encounter: Payer: Self-pay | Admitting: Internal Medicine

## 2022-02-25 ENCOUNTER — Ambulatory Visit (INDEPENDENT_AMBULATORY_CARE_PROVIDER_SITE_OTHER): Payer: Medicare Other | Admitting: Internal Medicine

## 2022-02-25 ENCOUNTER — Ambulatory Visit (HOSPITAL_COMMUNITY): Admission: RE | Admit: 2022-02-25 | Payer: Medicare Other | Source: Home / Self Care | Admitting: Internal Medicine

## 2022-02-25 VITALS — BP 136/74 | HR 78 | Temp 98.0°F | Ht 66.0 in | Wt 161.2 lb

## 2022-02-25 DIAGNOSIS — E8770 Fluid overload, unspecified: Secondary | ICD-10-CM

## 2022-02-25 DIAGNOSIS — R062 Wheezing: Secondary | ICD-10-CM

## 2022-02-25 DIAGNOSIS — R0602 Shortness of breath: Secondary | ICD-10-CM

## 2022-02-25 DIAGNOSIS — I63442 Cerebral infarction due to embolism of left cerebellar artery: Secondary | ICD-10-CM

## 2022-02-25 DIAGNOSIS — I272 Pulmonary hypertension, unspecified: Secondary | ICD-10-CM | POA: Diagnosis not present

## 2022-02-25 SURGERY — PACEMAKER IMPLANT

## 2022-02-25 MED ORDER — FUROSEMIDE 20 MG PO TABS: ORAL_TABLET | ORAL | 0 refills | Status: DC

## 2022-02-25 MED ORDER — POTASSIUM CHLORIDE CRYS ER 20 MEQ PO TBCR: 20.0000 meq | EXTENDED_RELEASE_TABLET | Freq: Every day | ORAL | 0 refills | Status: DC

## 2022-03-01 ENCOUNTER — Other Ambulatory Visit (INDEPENDENT_AMBULATORY_CARE_PROVIDER_SITE_OTHER): Payer: Medicare Other

## 2022-03-01 DIAGNOSIS — I272 Pulmonary hypertension, unspecified: Secondary | ICD-10-CM | POA: Diagnosis not present

## 2022-03-01 LAB — BASIC METABOLIC PANEL
BUN: 23 mg/dL (ref 6–23)
CO2: 32 mEq/L (ref 19–32)
Calcium: 9.4 mg/dL (ref 8.4–10.5)
Chloride: 103 mEq/L (ref 96–112)
Creatinine, Ser: 1.51 mg/dL — ABNORMAL HIGH (ref 0.40–1.20)
GFR: 30.69 mL/min — ABNORMAL LOW (ref >60.00)
Glucose, Bld: 109 mg/dL — ABNORMAL HIGH (ref 70–99)
Potassium: 4.5 mEq/L (ref 3.5–5.1)
Sodium: 141 mEq/L (ref 135–145)

## 2022-03-01 NOTE — Telephone Encounter (Signed)
Pts son called and stating pt is feeling worse and that the pt is taking the Lasix 2x a day. Pt states she is feeling sick on her stomach starting today. She noticed the swelling has gotten worse and more.  Pt is asking that a nurse call her with instructions on what to do next.  Pt son can be contacted at [phone].

## 2022-03-03 NOTE — Progress Notes (Unsigned)
Subjective:    Patient ID: Penny Miller, female    DOB: 07-11-1931, 88 y.o.   MRN: [id number]     HPI Gypsy is here for follow up of her chronic medical problems, including fluid overload  Taking lasix 20 mg daily.   Wt Readings from Last 3 Encounters:  03/04/22 154 lb (69.9 kg)  02/25/22 161 lb 3.2 oz (73.1 kg)  02/20/22 161 lb (73 kg)   SOB is better, swelling is better but still has some.   Still has palpitations and lightheadedness/dizziness.    Has drowsiness.  She is taking oral B12 daily.  Medications and allergies reviewed with patient and updated if appropriate.  Current Outpatient Medications on File Prior to Visit  Medication Sig Dispense Refill   ACCU-CHEK AVIVA PLUS test strip USE TO TEST BLOOD SUGAR TWICE DAILY. 100 each 11   acetaminophen (TYLENOL) 500 MG tablet Take 500 mg by mouth every 6 (six) hours as needed (pain).     amLODipine (NORVASC) 2.5 MG tablet Take 2.5 mg by mouth at bedtime.     apixaban (ELIQUIS) 2.5 MG TABS tablet Take 1 tablet (2.5 mg total) by mouth 2 (two) times daily. 180 tablet 0   atorvastatin (LIPITOR) 40 MG tablet TAKE 1 TABLET AT BEDTIME 90 tablet 1   budesonide (PULMICORT) 0.5 MG/2ML nebulizer solution USE 1 VIAL  IN  NEBULIZER TWICE  DAILY - - Rinse Mouth After Treatment 60 mL 5   citalopram (CELEXA) 10 MG tablet TAKE 1 TABLET DAILY 90 tablet 1   Cyanocobalamin (VITAMIN B 12 PO) Take by mouth.     famotidine (PEPCID) 20 MG tablet Take one tablet once to twice a day 60 tablet 0   ferrous sulfate 325 (65 FE) MG tablet Take 1 tablet (325 mg total) by mouth 2 (two) times daily with a meal. 180 tablet 1   fluticasone-salmeterol (ADVAIR) 250-50 MCG/ACT AEPB Inhale 1 puff into the lungs in the morning and at bedtime. 60 each 1   furosemide (LASIX) 20 MG tablet Take 40 mg daily for 3 days and then take 20 mg daily 40 tablet 0   gabapentin (NEURONTIN) 300 MG capsule Take 1 capsule (300 mg total) by mouth 3 (three) times daily. 90  capsule 1   hyoscyamine (LEVSIN SL) 0.125 MG SL tablet Place 1 tablet (0.125 mg total) under the tongue every 4 (four) hours as needed. Take tablet every 4-6 hours for abdominal pain and cramping 15 tablet 2   ipratropium-albuterol (DUONEB) 0.5-2.5 (3) MG/3ML SOLN USE 1 VIAL IN NEBULIZER EVERY 6 HOURS - And As Needed 120 mL 5   Lancets (ACCU-CHEK MULTICLIX) lancets Use to test blood sugar twice daily. E11.29 100 each 11   levocetirizine (XYZAL) 5 MG tablet Take 1 tablet (5 mg total) by mouth every evening. 90 tablet 1   lidocaine (LIDODERM) 5 % Place 1 patch onto the skin daily. Remove & Discard patch within 12 hours or as directed by MD 60 patch 1   Nebulizer MISC 1 Act by Does not apply route daily. 1 each 2   NONFORMULARY OR COMPOUNDED ITEM Transport chair   #1    dx leg and hip pain s/p fall 1 each 0   NONFORMULARY OR COMPOUNDED ITEM Rollator walker with seat    #1   dx gait abnormality 1 each 0   potassium chloride SA (KLOR-CON M) 20 MEQ tablet Take 1 tablet (20 mEq total) by mouth daily. 3 tablet 0  thiamine (VITAMIN B-1) 50 MG tablet Take 1 tablet (50 mg total) by mouth daily. 90 tablet 1   triamcinolone cream (KENALOG) 0.5 % APPLY 1 APPLICATION TOPICALLY TWICE A DAY 450 g 3   Vitamin D, Ergocalciferol, (DRISDOL) 1.25 MG (50000 UNIT) CAPS capsule TAKE 1 CAPSULE EVERY 7 DAYS 12 capsule 0   zinc gluconate 50 MG tablet Take 1 tablet (50 mg total) by mouth daily. 90 tablet 1   No current facility-administered medications on file prior to visit.     Review of Systems  Constitutional:  Negative for fever.  Respiratory:  Positive for shortness of breath (improved) and wheezing. Negative for cough.   Cardiovascular:  Positive for chest pain (a little at times), palpitations and leg swelling.  Gastrointestinal:  Positive for nausea (with dizziness).  Neurological:  Positive for dizziness (mild) and light-headedness (intermittent).       Objective:   Vitals:   03/04/22 0904  BP: 128/80   Pulse: 68  Temp: 97.9 F (36.6 C)  SpO2: 97%   BP Readings from Last 3 Encounters:  03/04/22 128/80  02/25/22 136/74  02/20/22 128/78   Wt Readings from Last 3 Encounters:  03/04/22 154 lb (69.9 kg)  02/25/22 161 lb 3.2 oz (73.1 kg)  02/20/22 161 lb (73 kg)   Body mass index is 24.86 kg/m.    Physical Exam Constitutional:      General: She is not in acute distress.    Appearance: Normal appearance. She is not ill-appearing.  HENT:     Head: Normocephalic and atraumatic.  Cardiovascular:     Rate and Rhythm: Normal rate and regular rhythm.  Pulmonary:     Effort: Pulmonary effort is normal. No respiratory distress.     Breath sounds: No wheezing or rales.  Musculoskeletal:     Right lower leg: Edema (1+ pitting-improved) present.     Left lower leg: Edema (1+ pitting-improved) present.  Neurological:     Mental Status: She is alert.        Lab Results  Component Value Date   WBC 7.6 02/20/2022   HGB 10.9 (L) 02/20/2022   HCT 32.8 (L) 02/20/2022   PLT 219.0 02/20/2022   GLUCOSE 109 (H) 03/01/2022   CHOL 166 05/28/2021   TRIG 192.0 (H) 05/28/2021   HDL 44.10 05/28/2021   LDLCALC 84 05/28/2021   ALT 9 02/20/2022   AST 13 02/20/2022   NA 141 03/01/2022   K 4.5 03/01/2022   CL 103 03/01/2022   CREATININE 1.51 (H) 03/01/2022   BUN 23 03/01/2022   CO2 32 03/01/2022   TSH 3.52 01/23/2022   INR 1.2 07/16/2021   HGBA1C 4.9 01/23/2022   MICROALBUR 9.7 (H) 05/28/2021     Assessment & Plan:    Hypervolemia / fluid overload, bilateral leg edema: May be multifactorial CKD, pulmonary hypertension, # degree of diastolic dysfunction Overall improved-Down 7 pounds, shortness of breath improved, leg swelling improved Continue Lasix 20 mg daily BMP today, BNP today Continue to elevate legs when sitting  Wheezing-persistent asthma: Chronic-intermittent I am sure the volume overload did not help and was increasing her symptoms, but there sounds like there is  also chronic asthma component Has been awaiting nebulizer for few months-issue with getting it through insurance Will try reordering and sent to [facility]-can use as needed  B12 deficiency, anemia: Improved when we checked her blood work last B12 injection today-this is #3 Continue oral B12 CBC today  She is more stable clinically-okay to continue  with pacemaker placed-her son will contact Dr. Tanna Furry office

## 2022-03-04 ENCOUNTER — Ambulatory Visit (INDEPENDENT_AMBULATORY_CARE_PROVIDER_SITE_OTHER): Payer: Medicare Other | Admitting: Internal Medicine

## 2022-03-04 ENCOUNTER — Encounter: Payer: Self-pay | Admitting: Internal Medicine

## 2022-03-04 ENCOUNTER — Telehealth: Payer: Self-pay | Admitting: Internal Medicine

## 2022-03-04 VITALS — BP 128/80 | HR 68 | Temp 97.9°F | Ht 66.0 in | Wt 154.0 lb

## 2022-03-04 DIAGNOSIS — D518 Other vitamin B12 deficiency anemias: Secondary | ICD-10-CM

## 2022-03-04 DIAGNOSIS — I63442 Cerebral infarction due to embolism of left cerebellar artery: Secondary | ICD-10-CM

## 2022-03-04 DIAGNOSIS — J4540 Moderate persistent asthma, uncomplicated: Secondary | ICD-10-CM

## 2022-03-04 DIAGNOSIS — E8770 Fluid overload, unspecified: Secondary | ICD-10-CM | POA: Diagnosis not present

## 2022-03-04 DIAGNOSIS — R600 Localized edema: Secondary | ICD-10-CM | POA: Diagnosis not present

## 2022-03-04 LAB — CBC WITH DIFFERENTIAL/PLATELET
Basophils Absolute: 0.1 10*3/uL (ref 0.0–0.1)
Basophils Relative: 1.1 % (ref 0.0–3.0)
Eosinophils Absolute: 0.2 10*3/uL (ref 0.0–0.7)
Eosinophils Relative: 3.5 % (ref 0.0–5.0)
HCT: 34.6 % — ABNORMAL LOW (ref 36.0–46.0)
Hemoglobin: 11.4 g/dL — ABNORMAL LOW (ref 12.0–15.0)
Lymphocytes Relative: 14.8 % (ref 12.0–46.0)
Lymphs Abs: 0.9 10*3/uL (ref 0.7–4.0)
MCHC: 33 g/dL (ref 30.0–36.0)
MCV: 99.9 fl (ref 78.0–100.0)
Monocytes Absolute: 0.7 10*3/uL (ref 0.1–1.0)
Monocytes Relative: 12.1 % — ABNORMAL HIGH (ref 3.0–12.0)
Neutro Abs: 4 10*3/uL (ref 1.4–7.7)
Neutrophils Relative %: 68.5 % (ref 43.0–77.0)
Platelets: 188 10*3/uL (ref 150.0–400.0)
RBC: 3.46 Mil/uL — ABNORMAL LOW (ref 3.87–5.11)
RDW: 13.8 % (ref 11.5–15.5)
WBC: 5.9 10*3/uL (ref 4.0–10.5)

## 2022-03-04 LAB — BASIC METABOLIC PANEL
BUN: 21 mg/dL (ref 6–23)
CO2: 32 mEq/L (ref 19–32)
Calcium: 9.6 mg/dL (ref 8.4–10.5)
Chloride: 101 mEq/L (ref 96–112)
Creatinine, Ser: 1.62 mg/dL — ABNORMAL HIGH (ref 0.40–1.20)
GFR: 28.21 mL/min — ABNORMAL LOW (ref >60.00)
Glucose, Bld: 111 mg/dL — ABNORMAL HIGH (ref 70–99)
Potassium: 4 mEq/L (ref 3.5–5.1)
Sodium: 140 mEq/L (ref 135–145)

## 2022-03-04 LAB — BRAIN NATRIURETIC PEPTIDE: Pro B Natriuretic peptide (BNP): 863 pg/mL — ABNORMAL HIGH (ref 0.0–100.0)

## 2022-03-04 MED ORDER — CYANOCOBALAMIN 1000 MCG/ML IJ SOLN
1000.0000 ug | Freq: Once | INTRAMUSCULAR | Status: AC
  Administered 2022-03-04: 1000 ug via INTRAMUSCULAR

## 2022-03-04 MED ORDER — NEBULIZER MISC: 1.0000 | Freq: Every day | 0 refills | Status: DC

## 2022-03-04 NOTE — Telephone Encounter (Signed)
Pt's son is requesting call back to re-schedule pacemaker implant procedure. Requesting call back.

## 2022-03-04 NOTE — Patient Instructions (Addendum)
     B12 injection given today.    Blood work was ordered.   The lab is on the first floor.    Medications changes include :   none    A nebulizer machine was sent to [facility].

## 2022-03-07 ENCOUNTER — Ambulatory Visit: Payer: Medicare Other

## 2022-03-08 ENCOUNTER — Ambulatory Visit
Admission: RE | Admit: 2022-03-08 | Discharge: 2022-03-08 | Disposition: A | Discharge status: Home / Self Care | Payer: Medicare Other | Source: Ambulatory Visit | Attending: Internal Medicine | Admitting: Internal Medicine

## 2022-03-08 DIAGNOSIS — J9811 Atelectasis: Secondary | ICD-10-CM | POA: Diagnosis not present

## 2022-03-08 DIAGNOSIS — J90 Pleural effusion, not elsewhere classified: Secondary | ICD-10-CM | POA: Diagnosis not present

## 2022-03-08 DIAGNOSIS — R0609 Other forms of dyspnea: Secondary | ICD-10-CM

## 2022-03-08 DIAGNOSIS — R911 Solitary pulmonary nodule: Secondary | ICD-10-CM | POA: Diagnosis not present

## 2022-03-08 DIAGNOSIS — R918 Other nonspecific abnormal finding of lung field: Secondary | ICD-10-CM | POA: Diagnosis not present

## 2022-03-08 DIAGNOSIS — R9389 Abnormal findings on diagnostic imaging of other specified body structures: Secondary | ICD-10-CM

## 2022-03-08 DIAGNOSIS — R0600 Dyspnea, unspecified: Secondary | ICD-10-CM | POA: Diagnosis not present

## 2022-03-08 DIAGNOSIS — I700 Atherosclerosis of aorta: Secondary | ICD-10-CM | POA: Diagnosis not present

## 2022-03-10 NOTE — Addendum Note (Signed)
Addended by: Binnie Rail on: 03/10/2022 02:11 PM   Modules accepted: Orders

## 2022-03-12 ENCOUNTER — Emergency Department (HOSPITAL_COMMUNITY): Payer: Medicare Other

## 2022-03-12 ENCOUNTER — Inpatient Hospital Stay (HOSPITAL_COMMUNITY)
Admission: EM | Admit: 2022-03-12 | Discharge: 2022-03-21 | DRG: 177 | Disposition: A | Discharge status: Skilled Nursing Facility | Payer: Medicare Other | Attending: Internal Medicine | Admitting: Internal Medicine

## 2022-03-12 ENCOUNTER — Encounter (HOSPITAL_COMMUNITY): Payer: Self-pay | Admitting: Internal Medicine

## 2022-03-12 ENCOUNTER — Telehealth: Payer: Self-pay | Admitting: Internal Medicine

## 2022-03-12 ENCOUNTER — Other Ambulatory Visit: Payer: Self-pay

## 2022-03-12 DIAGNOSIS — N184 Chronic kidney disease, stage 4 (severe): Secondary | ICD-10-CM | POA: Diagnosis not present

## 2022-03-12 DIAGNOSIS — D696 Thrombocytopenia, unspecified: Secondary | ICD-10-CM | POA: Diagnosis not present

## 2022-03-12 DIAGNOSIS — Z87440 Personal history of urinary (tract) infections: Secondary | ICD-10-CM

## 2022-03-12 DIAGNOSIS — E039 Hypothyroidism, unspecified: Secondary | ICD-10-CM | POA: Diagnosis not present

## 2022-03-12 DIAGNOSIS — E8809 Other disorders of plasma-protein metabolism, not elsewhere classified: Secondary | ICD-10-CM | POA: Diagnosis present

## 2022-03-12 DIAGNOSIS — R410 Disorientation, unspecified: Secondary | ICD-10-CM | POA: Diagnosis not present

## 2022-03-12 DIAGNOSIS — I10 Essential (primary) hypertension: Secondary | ICD-10-CM | POA: Diagnosis present

## 2022-03-12 DIAGNOSIS — E1129 Type 2 diabetes mellitus with other diabetic kidney complication: Secondary | ICD-10-CM | POA: Diagnosis present

## 2022-03-12 DIAGNOSIS — U071 COVID-19: Principal | ICD-10-CM | POA: Insufficient documentation

## 2022-03-12 DIAGNOSIS — R4182 Altered mental status, unspecified: Secondary | ICD-10-CM | POA: Diagnosis not present

## 2022-03-12 DIAGNOSIS — E1122 Type 2 diabetes mellitus with diabetic chronic kidney disease: Secondary | ICD-10-CM | POA: Diagnosis present

## 2022-03-12 DIAGNOSIS — I272 Pulmonary hypertension, unspecified: Secondary | ICD-10-CM | POA: Diagnosis present

## 2022-03-12 DIAGNOSIS — R42 Dizziness and giddiness: Secondary | ICD-10-CM | POA: Diagnosis not present

## 2022-03-12 DIAGNOSIS — E1149 Type 2 diabetes mellitus with other diabetic neurological complication: Secondary | ICD-10-CM | POA: Diagnosis present

## 2022-03-12 DIAGNOSIS — G9349 Other encephalopathy: Secondary | ICD-10-CM | POA: Diagnosis present

## 2022-03-12 DIAGNOSIS — J9601 Acute respiratory failure with hypoxia: Secondary | ICD-10-CM | POA: Diagnosis present

## 2022-03-12 DIAGNOSIS — G9340 Encephalopathy, unspecified: Secondary | ICD-10-CM | POA: Diagnosis not present

## 2022-03-12 DIAGNOSIS — Z7901 Long term (current) use of anticoagulants: Secondary | ICD-10-CM

## 2022-03-12 DIAGNOSIS — J1282 Pneumonia due to coronavirus disease 2019: Secondary | ICD-10-CM | POA: Diagnosis present

## 2022-03-12 DIAGNOSIS — Z8052 Family history of malignant neoplasm of bladder: Secondary | ICD-10-CM

## 2022-03-12 DIAGNOSIS — I951 Orthostatic hypotension: Secondary | ICD-10-CM | POA: Diagnosis not present

## 2022-03-12 DIAGNOSIS — J90 Pleural effusion, not elsewhere classified: Secondary | ICD-10-CM | POA: Diagnosis not present

## 2022-03-12 DIAGNOSIS — Z881 Allergy status to other antibiotic agents status: Secondary | ICD-10-CM

## 2022-03-12 DIAGNOSIS — I4819 Other persistent atrial fibrillation: Secondary | ICD-10-CM | POA: Diagnosis present

## 2022-03-12 DIAGNOSIS — Z833 Family history of diabetes mellitus: Secondary | ICD-10-CM

## 2022-03-12 DIAGNOSIS — Z79899 Other long term (current) drug therapy: Secondary | ICD-10-CM

## 2022-03-12 DIAGNOSIS — Z87891 Personal history of nicotine dependence: Secondary | ICD-10-CM | POA: Diagnosis not present

## 2022-03-12 DIAGNOSIS — R059 Cough, unspecified: Secondary | ICD-10-CM | POA: Diagnosis not present

## 2022-03-12 DIAGNOSIS — Z7401 Bed confinement status: Secondary | ICD-10-CM | POA: Diagnosis not present

## 2022-03-12 DIAGNOSIS — E1165 Type 2 diabetes mellitus with hyperglycemia: Secondary | ICD-10-CM | POA: Diagnosis present

## 2022-03-12 DIAGNOSIS — Z8249 Family history of ischemic heart disease and other diseases of the circulatory system: Secondary | ICD-10-CM

## 2022-03-12 DIAGNOSIS — M6281 Muscle weakness (generalized): Secondary | ICD-10-CM | POA: Diagnosis present

## 2022-03-12 DIAGNOSIS — Z7951 Long term (current) use of inhaled steroids: Secondary | ICD-10-CM

## 2022-03-12 DIAGNOSIS — N1832 Chronic kidney disease, stage 3b: Secondary | ICD-10-CM | POA: Diagnosis present

## 2022-03-12 DIAGNOSIS — I129 Hypertensive chronic kidney disease with stage 1 through stage 4 chronic kidney disease, or unspecified chronic kidney disease: Secondary | ICD-10-CM | POA: Diagnosis present

## 2022-03-12 DIAGNOSIS — Z825 Family history of asthma and other chronic lower respiratory diseases: Secondary | ICD-10-CM

## 2022-03-12 DIAGNOSIS — D631 Anemia in chronic kidney disease: Secondary | ICD-10-CM | POA: Diagnosis present

## 2022-03-12 DIAGNOSIS — N1831 Chronic kidney disease, stage 3a: Secondary | ICD-10-CM

## 2022-03-12 DIAGNOSIS — E1142 Type 2 diabetes mellitus with diabetic polyneuropathy: Secondary | ICD-10-CM | POA: Diagnosis not present

## 2022-03-12 DIAGNOSIS — Z800 Family history of malignant neoplasm of digestive organs: Secondary | ICD-10-CM

## 2022-03-12 DIAGNOSIS — J9811 Atelectasis: Secondary | ICD-10-CM | POA: Diagnosis not present

## 2022-03-12 DIAGNOSIS — Z85828 Personal history of other malignant neoplasm of skin: Secondary | ICD-10-CM

## 2022-03-12 DIAGNOSIS — E785 Hyperlipidemia, unspecified: Secondary | ICD-10-CM | POA: Diagnosis present

## 2022-03-12 DIAGNOSIS — I499 Cardiac arrhythmia, unspecified: Secondary | ICD-10-CM | POA: Diagnosis not present

## 2022-03-12 DIAGNOSIS — Z885 Allergy status to narcotic agent status: Secondary | ICD-10-CM

## 2022-03-12 DIAGNOSIS — Z888 Allergy status to other drugs, medicaments and biological substances status: Secondary | ICD-10-CM

## 2022-03-12 DIAGNOSIS — I480 Paroxysmal atrial fibrillation: Secondary | ICD-10-CM | POA: Diagnosis present

## 2022-03-12 DIAGNOSIS — Z8673 Personal history of transient ischemic attack (TIA), and cerebral infarction without residual deficits: Secondary | ICD-10-CM | POA: Diagnosis not present

## 2022-03-12 DIAGNOSIS — M1990 Unspecified osteoarthritis, unspecified site: Secondary | ICD-10-CM | POA: Diagnosis present

## 2022-03-12 DIAGNOSIS — R404 Transient alteration of awareness: Secondary | ICD-10-CM | POA: Diagnosis not present

## 2022-03-12 DIAGNOSIS — N3281 Overactive bladder: Secondary | ICD-10-CM | POA: Diagnosis present

## 2022-03-12 DIAGNOSIS — R0602 Shortness of breath: Secondary | ICD-10-CM | POA: Diagnosis not present

## 2022-03-12 DIAGNOSIS — R296 Repeated falls: Secondary | ICD-10-CM | POA: Diagnosis present

## 2022-03-12 DIAGNOSIS — Z882 Allergy status to sulfonamides status: Secondary | ICD-10-CM

## 2022-03-12 DIAGNOSIS — Z66 Do not resuscitate: Secondary | ICD-10-CM | POA: Diagnosis present

## 2022-03-12 DIAGNOSIS — Z743 Need for continuous supervision: Secondary | ICD-10-CM | POA: Diagnosis not present

## 2022-03-12 DIAGNOSIS — J45901 Unspecified asthma with (acute) exacerbation: Secondary | ICD-10-CM | POA: Diagnosis not present

## 2022-03-12 LAB — COMPREHENSIVE METABOLIC PANEL
ALT: 11 U/L (ref 0–44)
AST: 20 U/L (ref 15–41)
Albumin: 3.4 g/dL — ABNORMAL LOW (ref 3.5–5.0)
Alkaline Phosphatase: 63 U/L (ref 38–126)
Anion gap: 8 (ref 5–15)
BUN: 17 mg/dL (ref 8–23)
CO2: 27 mmol/L (ref 22–32)
Calcium: 9.8 mg/dL (ref 8.9–10.3)
Chloride: 102 mmol/L (ref 98–111)
Creatinine, Ser: 1.44 mg/dL — ABNORMAL HIGH (ref 0.44–1.00)
GFR, Estimated: 35 mL/min — ABNORMAL LOW (ref >60)
Glucose, Bld: 100 mg/dL — ABNORMAL HIGH (ref 70–99)
Potassium: 3.8 mmol/L (ref 3.5–5.1)
Sodium: 137 mmol/L (ref 135–145)
Total Bilirubin: 0.3 mg/dL (ref 0.3–1.2)
Total Protein: 5.9 g/dL — ABNORMAL LOW (ref 6.5–8.1)

## 2022-03-12 LAB — CBC WITH DIFFERENTIAL/PLATELET
Abs Immature Granulocytes: 0.01 10*3/uL (ref 0.00–0.07)
Basophils Absolute: 0 10*3/uL (ref 0.0–0.1)
Basophils Relative: 1 %
Eosinophils Absolute: 0 10*3/uL (ref 0.0–0.5)
Eosinophils Relative: 0 %
HCT: 32 % — ABNORMAL LOW (ref 36.0–46.0)
Hemoglobin: 10.4 g/dL — ABNORMAL LOW (ref 12.0–15.0)
Immature Granulocytes: 0 %
Lymphocytes Relative: 15 %
Lymphs Abs: 0.6 10*3/uL — ABNORMAL LOW (ref 0.7–4.0)
MCH: 32.6 pg (ref 26.0–34.0)
MCHC: 32.5 g/dL (ref 30.0–36.0)
MCV: 100.3 fL — ABNORMAL HIGH (ref 80.0–100.0)
Monocytes Absolute: 0.8 10*3/uL (ref 0.1–1.0)
Monocytes Relative: 20 %
Neutro Abs: 2.6 10*3/uL (ref 1.7–7.7)
Neutrophils Relative %: 64 %
Platelets: 140 10*3/uL — ABNORMAL LOW (ref 150–400)
RBC: 3.19 MIL/uL — ABNORMAL LOW (ref 3.87–5.11)
RDW: 13.2 % (ref 11.5–15.5)
WBC: 4 10*3/uL (ref 4.0–10.5)
nRBC: 0 % (ref 0.0–0.2)

## 2022-03-12 LAB — MAGNESIUM: Magnesium: 1.9 mg/dL (ref 1.7–2.4)

## 2022-03-12 LAB — URINALYSIS, ROUTINE W REFLEX MICROSCOPIC
Bacteria, UA: NONE SEEN
Bilirubin Urine: NEGATIVE
Glucose, UA: NEGATIVE mg/dL
Hgb urine dipstick: NEGATIVE
Ketones, ur: NEGATIVE mg/dL
Leukocytes,Ua: NEGATIVE
Nitrite: NEGATIVE
Protein, ur: 30 mg/dL — AB
Specific Gravity, Urine: 1.009 (ref 1.005–1.030)
pH: 8 (ref 5.0–8.0)

## 2022-03-12 LAB — ACETAMINOPHEN LEVEL: Acetaminophen (Tylenol), Serum: <10 ug/mL — ABNORMAL LOW (ref 10–30)

## 2022-03-12 LAB — RESP PANEL BY RT-PCR (RSV, FLU A&B, COVID)  RVPGX2
Influenza A by PCR: NEGATIVE
Influenza B by PCR: NEGATIVE
Resp Syncytial Virus by PCR: NEGATIVE
SARS Coronavirus 2 by RT PCR: POSITIVE — AB

## 2022-03-12 LAB — TSH: TSH: 4.247 u[IU]/mL (ref 0.350–4.500)

## 2022-03-12 LAB — ETHANOL: Alcohol, Ethyl (B): <10 mg/dL (ref <10)

## 2022-03-12 LAB — SALICYLATE LEVEL: Salicylate Lvl: <7 mg/dL — ABNORMAL LOW (ref 7.0–30.0)

## 2022-03-12 MED ORDER — FUROSEMIDE 40 MG PO TABS
40.0000 mg | ORAL_TABLET | Freq: Every day | ORAL | Status: DC
  Administered 2022-03-13 – 2022-03-14 (×2): 40 mg via ORAL
  Filled 2022-03-12 (×2): qty 2

## 2022-03-12 MED ORDER — ACETAMINOPHEN 325 MG PO TABS: 650.0000 mg | ORAL_TABLET | Freq: Four times a day (QID) | ORAL | Status: DC | PRN

## 2022-03-12 MED ORDER — CITALOPRAM HYDROBROMIDE 10 MG PO TABS
10.0000 mg | ORAL_TABLET | Freq: Every day | ORAL | Status: DC
  Administered 2022-03-13: 10 mg via ORAL
  Filled 2022-03-12: qty 1

## 2022-03-12 MED ORDER — PREDNISONE 5 MG PO TABS
50.0000 mg | ORAL_TABLET | Freq: Every day | ORAL | Status: DC
  Administered 2022-03-13 – 2022-03-15 (×3): 50 mg via ORAL
  Filled 2022-03-12 (×3): qty 2
  Filled 2022-03-12: qty 3

## 2022-03-12 MED ORDER — APIXABAN 2.5 MG PO TABS
2.5000 mg | ORAL_TABLET | Freq: Two times a day (BID) | ORAL | Status: DC
  Administered 2022-03-12 – 2022-03-21 (×18): 2.5 mg via ORAL
  Filled 2022-03-12 (×18): qty 1

## 2022-03-12 MED ORDER — AMLODIPINE BESYLATE 5 MG PO TABS
2.5000 mg | ORAL_TABLET | Freq: Every day | ORAL | Status: DC
  Administered 2022-03-13 – 2022-03-20 (×6): 2.5 mg via ORAL
  Filled 2022-03-12 (×6): qty 1

## 2022-03-12 MED ORDER — ACETAMINOPHEN 650 MG RE SUPP: 650.0000 mg | Freq: Four times a day (QID) | RECTAL | Status: DC | PRN

## 2022-03-12 MED ORDER — MOMETASONE FURO-FORMOTEROL FUM 200-5 MCG/ACT IN AERO
2.0000 | INHALATION_SPRAY | Freq: Two times a day (BID) | RESPIRATORY_TRACT | Status: DC
  Administered 2022-03-12 – 2022-03-21 (×16): 2 via RESPIRATORY_TRACT
  Filled 2022-03-12 (×2): qty 8.8

## 2022-03-12 MED ORDER — ZINC SULFATE 220 (50 ZN) MG PO CAPS
220.0000 mg | ORAL_CAPSULE | Freq: Every day | ORAL | Status: DC
  Administered 2022-03-13 – 2022-03-21 (×9): 220 mg via ORAL
  Filled 2022-03-12 (×9): qty 1

## 2022-03-12 MED ORDER — IPRATROPIUM-ALBUTEROL 0.5-2.5 (3) MG/3ML IN SOLN
3.0000 mL | Freq: Once | RESPIRATORY_TRACT | Status: AC
  Administered 2022-03-12: 3 mL via RESPIRATORY_TRACT
  Filled 2022-03-12: qty 3

## 2022-03-12 MED ORDER — INSULIN ASPART 100 UNIT/ML IJ SOLN
0.0000 [IU] | Freq: Three times a day (TID) | INTRAMUSCULAR | Status: DC
  Administered 2022-03-13: 2 [IU] via SUBCUTANEOUS
  Administered 2022-03-13: 3 [IU] via SUBCUTANEOUS
  Administered 2022-03-13: 5 [IU] via SUBCUTANEOUS
  Administered 2022-03-14: 1 [IU] via SUBCUTANEOUS
  Administered 2022-03-14 – 2022-03-15 (×2): 3 [IU] via SUBCUTANEOUS
  Administered 2022-03-15 – 2022-03-16 (×2): 1 [IU] via SUBCUTANEOUS
  Administered 2022-03-16 (×2): 3 [IU] via SUBCUTANEOUS
  Administered 2022-03-17: 2 [IU] via SUBCUTANEOUS
  Administered 2022-03-17: 3 [IU] via SUBCUTANEOUS
  Administered 2022-03-17: 5 [IU] via SUBCUTANEOUS
  Administered 2022-03-18 (×2): 2 [IU] via SUBCUTANEOUS
  Administered 2022-03-18 – 2022-03-19 (×3): 1 [IU] via SUBCUTANEOUS
  Administered 2022-03-20: 5 [IU] via SUBCUTANEOUS
  Administered 2022-03-20: 2 [IU] via SUBCUTANEOUS

## 2022-03-12 MED ORDER — IPRATROPIUM BROMIDE 0.02 % IN SOLN
0.5000 mg | Freq: Four times a day (QID) | RESPIRATORY_TRACT | Status: DC
  Administered 2022-03-12 – 2022-03-14 (×7): 0.5 mg via RESPIRATORY_TRACT
  Filled 2022-03-12 (×8): qty 2.5

## 2022-03-12 MED ORDER — THIAMINE MONONITRATE 100 MG PO TABS
50.0000 mg | ORAL_TABLET | Freq: Every day | ORAL | Status: DC
  Administered 2022-03-13 – 2022-03-21 (×9): 50 mg via ORAL
  Filled 2022-03-12 (×9): qty 1

## 2022-03-12 MED ORDER — MOLNUPIRAVIR EUA 200MG CAPSULE
4.0000 | ORAL_CAPSULE | Freq: Two times a day (BID) | ORAL | Status: AC
  Administered 2022-03-12 – 2022-03-17 (×10): 800 mg via ORAL
  Filled 2022-03-12 (×2): qty 4

## 2022-03-12 MED ORDER — GABAPENTIN 300 MG PO CAPS
300.0000 mg | ORAL_CAPSULE | Freq: Three times a day (TID) | ORAL | Status: DC
  Administered 2022-03-12 – 2022-03-21 (×26): 300 mg via ORAL
  Filled 2022-03-12 (×26): qty 1

## 2022-03-12 MED ORDER — POLYETHYLENE GLYCOL 3350 17 G PO PACK: 17.0000 g | PACK | Freq: Every day | ORAL | Status: DC | PRN

## 2022-03-12 MED ORDER — SODIUM CHLORIDE 0.9% FLUSH
3.0000 mL | Freq: Two times a day (BID) | INTRAVENOUS | Status: DC
  Administered 2022-03-12 – 2022-03-21 (×16): 3 mL via INTRAVENOUS

## 2022-03-12 MED ORDER — METHYLPREDNISOLONE SODIUM SUCC 125 MG IJ SOLR
125.0000 mg | Freq: Once | INTRAMUSCULAR | Status: AC
  Administered 2022-03-12: 125 mg via INTRAVENOUS
  Filled 2022-03-12: qty 2

## 2022-03-12 MED ORDER — ALBUTEROL SULFATE (2.5 MG/3ML) 0.083% IN NEBU: 2.5000 mg | INHALATION_SOLUTION | RESPIRATORY_TRACT | Status: DC | PRN

## 2022-03-12 MED ORDER — ATORVASTATIN CALCIUM 40 MG PO TABS
40.0000 mg | ORAL_TABLET | Freq: Every day | ORAL | Status: DC
  Administered 2022-03-12 – 2022-03-20 (×9): 40 mg via ORAL
  Filled 2022-03-12 (×8): qty 1

## 2022-03-12 NOTE — ED Triage Notes (Signed)
Patient was bib [facility] from home with complaints of altered mental status. The family states that around 0800 this morning she began to become confused. Currently alert and oriented x3. She has a history of chronic UTI family did state she is going to restroom frequently and not being able to use it. VSS

## 2022-03-12 NOTE — ED Provider Notes (Signed)
[facility] [facility] EMERGENCY DEPARTMENT Provider Note   CSN: [id number] Arrival date & time: 03/12/22  1253     History  Chief Complaint  Patient presents with   Altered Mental Status    Penny Miller is a 88 y.o. female with HTN, chronic interstitial cystitis, T2DM, paroxysmal afib, CKD stage 4, spinal stenosis, asthma, IDA, OAB, chronic cerebral ischemia, compression fracture of T12 who presents with AMS.   Granddaughter at bedside states patient has become confused, A&Ox1. Normally alert and oriented x3, conversant, can make her own food, etc. But Now is not that way, cannot carry a conversation appropriately now. LKN was yesterday morning. She has a history of chronic UTI family did state she is going to restroom frequently with urgency and not being able to use it. Also cough x 1 week. No known fevers/chills, SOB, CP, abd pain, N/V/D/C, trauma, falls, numbness/tingling, asymmetric weakness, trouble speaking or swallowing. Granddaughter states that patient lives with her father. Has interstitial cystitis with frequent UTIs.    Altered Mental Status      Home Medications Prior to Admission medications   Medication Sig Start Date End Date Taking? Authorizing Provider  ACCU-CHEK AVIVA PLUS test strip USE TO TEST BLOOD SUGAR TWICE DAILY. 09/11/18   Reed, [name] L, DO  acetaminophen (TYLENOL) 500 MG tablet Take 500 mg by mouth every 6 (six) hours as needed (pain).    [provider]  amLODipine (NORVASC) 2.5 MG tablet Take 2.5 mg by mouth at bedtime. 09/06/21   [provider]  apixaban (ELIQUIS) 2.5 MG TABS tablet Take 1 tablet (2.5 mg total) by mouth 2 (two) times daily. 09/28/21   Minus Breeding, MD  atorvastatin (LIPITOR) 40 MG tablet TAKE 1 TABLET AT BEDTIME 01/04/22   [name], MD  budesonide (PULMICORT) 0.5 MG/2ML nebulizer solution USE 1 VIAL  IN  NEBULIZER TWICE  DAILY - - Rinse Mouth After Treatment 01/25/22   [name], MD   citalopram (CELEXA) 10 MG tablet TAKE 1 TABLET DAILY 02/18/22   [name], MD  Cyanocobalamin (VITAMIN B 12 PO) Take by mouth.    [provider]  famotidine (PEPCID) 20 MG tablet Take one tablet once to twice a day 02/05/22   Armbruster, Carlota Raspberry, MD  ferrous sulfate 325 (65 FE) MG tablet Take 1 tablet (325 mg total) by mouth 2 (two) times daily with a meal. 02/05/22   Armbruster, Carlota Raspberry, MD  fluticasone-salmeterol (ADVAIR) 250-50 MCG/ACT AEPB Inhale 1 puff into the lungs in the morning and at bedtime. 02/20/22   Binnie Rail, MD  furosemide (LASIX) 20 MG tablet Take 40 mg daily for 3 days and then take 20 mg daily 02/25/22   Binnie Rail, MD  gabapentin (NEURONTIN) 300 MG capsule Take 1 capsule (300 mg total) by mouth 3 (three) times daily. 08/14/21   [name], MD  hyoscyamine (LEVSIN SL) 0.125 MG SL tablet Place 1 tablet (0.125 mg total) under the tongue every 4 (four) hours as needed. Take tablet every 4-6 hours for abdominal pain and cramping 03/23/20   Levin Erp, [place]  ipratropium-albuterol (DUONEB) 0.5-2.5 (3) MG/3ML SOLN USE 1 VIAL IN NEBULIZER EVERY 6 HOURS - And As Needed 01/25/22   [name], MD  Lancets (ACCU-CHEK MULTICLIX) lancets Use to test blood sugar twice daily. E11.29 07/04/17   Reed, [name] L, DO  levocetirizine (XYZAL) 5 MG tablet Take 1 tablet (5 mg total) by mouth every evening.  01/23/22   [name], MD  lidocaine (LIDODERM) 5 % Place 1 patch onto the skin daily. Remove & Discard patch within 12 hours or as directed by MD 07/23/21   Biagio Borg, MD  Nebulizer MISC 1 Act by Does not apply route daily. Dx J45.4 03/04/22   Binnie Rail, MD  NONFORMULARY OR COMPOUNDED ITEM Transport chair   #1    dx leg and hip pain s/p fall 11/10/20   Carollee Herter, Kendrick Fries R, DO  NONFORMULARY OR COMPOUNDED ITEM Rollator walker with seat    #1   dx gait abnormality 11/10/20   Carollee Herter, Alferd Apa, DO  potassium chloride SA (KLOR-CON M) 20 MEQ tablet  Take 1 tablet (20 mEq total) by mouth daily. 02/25/22   Binnie Rail, MD  thiamine (VITAMIN B-1) 50 MG tablet Take 1 tablet (50 mg total) by mouth daily. 01/30/22   [name], MD  triamcinolone cream (KENALOG) 0.5 % APPLY 1 APPLICATION TOPICALLY TWICE A DAY 02/20/21   [name], MD  Vitamin D, Ergocalciferol, (DRISDOL) 1.25 MG (50000 UNIT) CAPS capsule TAKE 1 CAPSULE EVERY 7 DAYS 01/18/22   [name], MD  zinc gluconate 50 MG tablet Take 1 tablet (50 mg total) by mouth daily. 01/30/22   [name], MD      Allergies    Jardiance [empagliflozin], Metformin and related, Codeine, Metoprolol, Relafen [nabumetone], Septra [sulfamethoxazole-trimethoprim], Tetracycline, Adhesive [tape], Ceclor [cefaclor], Cefaclor, Codeine, Jardiance [empagliflozin], Lopid [gemfibrozil], Lopid [gemfibrozil], Lopressor [metoprolol tartrate], Metformin and related, Morphine and related, Morphine and related, Niacin, Niacin and related, Niacin and related, Relafen [nabumetone], Septra [sulfamethoxazole-trimethoprim], Tape, Tetracyclines & related, Toprol xl [metoprolol tartrate], and Toprol xl [metoprolol]    Review of Systems   Review of Systems Review of systems Negative for f/c.  A 10 point review of systems was performed and is negative unless otherwise reported in HPI.  Physical Exam Updated Vital Signs BP (!) 154/70   Pulse (!) 56   Temp 99.1 F (37.3 C) (Oral)   Resp 17   SpO2 94%  Physical Exam General: Normal appearing elderly female, lying in bed.  HEENT: PERRLA, EOMI, Sclera anicteric, MMM, trachea midline.  Cardiology: RRR, no murmurs/rubs/gallops. BL radial and DP pulses equal bilaterally.  Resp: Normal respiratory rate and effort. CTAB, no wheezes, rhonchi, crackles. Intermittent cough  Abd: Soft, non-tender, non-distended. No rebound tenderness or guarding.  GU: Deferred. MSK: 1+ Pitting edema symmetric bilaterally. No signs of trauma. Extremities without deformity or TTP. No  cyanosis or clubbing. Skin: warm, dry. No rashes or lesions. Neuro: A&Ox1, CNs II-XII grossly intact. MAEs. Sensation grossly intact. NIHSS 1 for orientation questions. Psych: Normal mood and affect.   ED Results / Procedures / Treatments   Labs (all labs ordered are listed, but only abnormal results are displayed) Labs Reviewed  CBC WITH DIFFERENTIAL/PLATELET - Abnormal; Notable for the following components:      Result Value   RBC 3.19 (*)    Hemoglobin 10.4 (*)    HCT 32.0 (*)    MCV 100.3 (*)    Platelets 140 (*)    Lymphs Abs 0.6 (*)    All other components within normal limits  COMPREHENSIVE METABOLIC PANEL - Abnormal; Notable for the following components:   Glucose, Bld 100 (*)    Creatinine, Ser 1.44 (*)    Total Protein 5.9 (*)    Albumin 3.4 (*)    GFR, Estimated 35 (*)    All  other components within normal limits  URINE CULTURE  RESP PANEL BY RT-PCR (RSV, FLU A&B, COVID)  [id number]  MAGNESIUM  URINALYSIS, ROUTINE W REFLEX MICROSCOPIC  TSH  ETHANOL  SALICYLATE LEVEL  ACETAMINOPHEN LEVEL    EKG EKG Interpretation  Date/Time:  Tuesday March 12 2022 12:57:23 EST Ventricular Rate:  51 PR Interval:    QRS Duration: 124 QT Interval:  464 QTC Calculation: 428 R Axis:   -77 Text Interpretation: Atrial fibrillation Nonspecific IVCD with LAD Abnormal T, consider ischemia, lateral leads Confirmed by Cindee Lame [phone]) on 03/12/2022 3:44:08 PM  Radiology No results found.  Procedures Procedures    Medications Ordered in ED Medications - No data to display  ED Course/ Medical Decision Making/ A&P                          Medical Decision Making Amount and/or Complexity of Data Reviewed Labs: ordered. Radiology: ordered.    This patient presents to the ED for concern of AMS, this involves an extensive number of treatment options, and is a complaint that carries with it a high risk of complications and morbidity.  I considered the following  differential and admission for this acute, potentially life threatening condition.   MDM:    Ddx of acute altered mental status or encephalopathy considered but not limited to: -Intracranial abnormalities such as ICH, hydrocephalus - no head trauma and she is focally neuro intact, but will obtain ICH. LKN was yesterday morning, considered CVA but NIHSS is 1 and has no other FNDs, can consider possible MRI for ischemic cause of no other metabolic cause of AMS is identified -Infection such as UTI, PNA - she has had urinary frequency/urgency and cough. Will evaluate for flu/covid, PNA, UTI -Toxic ingestion such as opioid overdose, anticholinergic toxicity - no noted recent med changes -Electrolyte abnormalities or hyper/hypoglycemia -Uremic encephalopathy -ACS or arrhythmia - no report of CP or SOB, but will obtain EKG -Endocrine abnormality such as thyroid storm or myxedema coma  Clinical Course as of 03/12/22 1555  Tue Mar 12, 2022  1550 Hemoglobin(!): 10.4 [HN]  1550 WBC: 4.0 [HN]  1550 Creatinine(!): 1.44 [HN]  1550 Patient is signed out to the oncoming ED physician who is made aware of her history, presentation, exam, workup, and plan.  Plan is to obtain remainder of labs, CTH, and CXR.  [HN]    Clinical Course User Index [HN] Audley Hose, MD     Labs: I Ordered, and personally interpreted labs.  The pertinent results include:  those listed above  Imaging Studies ordered: I ordered imaging studies including CXR, CTH  Additional history obtained from granddaughter at bedside.   Cardiac Monitoring: The patient was maintained on a cardiac monitor.  I personally viewed and interpreted the cardiac monitored which showed an underlying rhythm of: Afib rate controlled  Social Determinants of Health: Patient lives at home with her son  Disposition:  TBD  Co morbidities that complicate the patient evaluation  Past Medical History:  Diagnosis Date   Allergic rhinitis, cause  unspecified    Anxiety state, unspecified    Arthritis    Colon polyp    Diaphragmatic hernia without mention of obstruction or gangrene    Diverticulosis of colon (without mention of hemorrhage)    Female stress incontinence    HTN (hypertension)    Insomnia, unspecified    Interstitial cystitis    surgery 02/01/2013   Irritable bowel syndrome  Memory loss    Other and unspecified hyperlipidemia    Other premature beats    Polyneuropathy in diabetes(357.2)    Postmenopausal bleeding    Proteinuria    Rectal ulcer    Skin cancer    Stroke ([place]) 2018   Type II or unspecified type diabetes mellitus with neurological manifestations, not stated as uncontrolled(250.60)    Unspecified essential hypertension    Viral warts, unspecified      Medicines No orders of the defined types were placed in this encounter.   I have reviewed the patients home medicines and have made adjustments as needed  Problem List / ED Course: Problem List Items Addressed This Visit   None Visit Diagnoses     Altered mental status, unspecified altered mental status type    -  Primary                 This note was created using dictation software, which may contain spelling or grammatical errors.    Audley Hose, MD 03/12/22 1556

## 2022-03-12 NOTE — ED Notes (Signed)
Purwick placed for urine sample

## 2022-03-12 NOTE — Telephone Encounter (Signed)
Patients Granddaughter called  and said patient cognitive function is off, she said they tried to get her to the bathroom this morning and they weren't able to make it, They did say there is a frequency of urination but that she is on Lasix so its normal for her. They said she doesn't really seem to know what's going on and that she has mostly been dead weight. They said that the way she is right now they wouldn't be able to get her to the doctors office. I transferred to the triage nurse for further eval

## 2022-03-12 NOTE — ED Provider Notes (Signed)
Care of patient assumed from Dr. Mayra Neer.  This patient is a functional 88 year old female presenting for new onset of altered mental status.  Last known normal was yesterday morning.  She lives with her son.  She arrived in the ED with her granddaughter, who assists in the history.  She has had a recent cough.  She has a history of UTIs.  She was oriented only to self on arrival.  She had no focal neurologic deficits.  Suspect metabolic cause.  Currently she is early in her workup. Physical Exam  BP (!) 171/73   Pulse 65   Temp 99.1 F (37.3 C) (Oral)   Resp 13   SpO2 95%   Physical Exam Vitals and nursing note reviewed.  Constitutional:      General: She is not in acute distress.    Appearance: She is well-developed. She is not toxic-appearing or diaphoretic.  HENT:     Head: Normocephalic and atraumatic.     Right Ear: External ear normal.     Left Ear: External ear normal.     Nose: Nose normal.     Mouth/Throat:     Mouth: Mucous membranes are moist.  Eyes:     Extraocular Movements: Extraocular movements intact.     Conjunctiva/sclera: Conjunctivae normal.  Cardiovascular:     Rate and Rhythm: Normal rate and regular rhythm.     Heart sounds: No murmur heard. Pulmonary:     Effort: Tachypnea present. No respiratory distress.     Breath sounds: Wheezing present.  Abdominal:     General: There is no distension.     Palpations: Abdomen is soft.     Tenderness: There is no abdominal tenderness.  Musculoskeletal:        General: No swelling. Normal range of motion.     Cervical back: Normal range of motion and neck supple.  Skin:    General: Skin is warm and dry.     Capillary Refill: Capillary refill takes less than 2 seconds.     Coloration: Skin is not jaundiced or pale.  Neurological:     General: No focal deficit present.     Mental Status: She is alert. She is disoriented.     Cranial Nerves: No cranial nerve deficit.     Sensory: No sensory deficit.     Motor: No  weakness.     Coordination: Coordination normal.  Psychiatric:        Mood and Affect: Mood normal.        Behavior: Behavior normal.        Thought Content: Thought content normal.        Judgment: Judgment normal.     Procedures  Procedures  ED Course / MDM   Clinical Course as of 03/12/22 1640  Tue Mar 12, 2022  1550 Hemoglobin(!): 10.4 [HN]  1550 WBC: 4.0 [HN]  1550 Creatinine(!): 1.44 [HN]  1550 Patient is signed out to the oncoming ED physician who is made aware of her history, presentation, exam, workup, and plan.  Plan is to obtain remainder of labs, CTH, and CXR.  [HN]    Clinical Course User Index [HN] Audley Hose, MD   Medical Decision Making Amount and/or Complexity of Data Reviewed Labs: ordered. Decision-making details documented in ED Course. Radiology: ordered.  Risk Prescription drug management.   On assessment, patient is tachypneic.  SpO2 remains normal on room air.  On lung auscultation, she does have diffuse expiratory wheezing.  Solu-Medrol and DuoNeb  ordered.  Workup is notable for COVID-19.  Patient is currently on Eliquis for atrial fibrillation.  Molnupiravir was ordered.  She remains disoriented.  Patient was admitted to hospitalist for further management.       Godfrey Pick, MD 03/12/22 [phone]

## 2022-03-12 NOTE — H&P (Addendum)
History and Physical   MONTSERRATH MADDING WER:[id number] DOB: 10-11-1931 DOA: 03/12/2022  PCP: [name], MD   Patient coming from: Home  Chief Complaint: Altered mental status  HPI: Penny Miller is a 88 y.o. female with medical history significant of spinal stenosis, sinus node dysfunction, diabetes, neuropathy, atrial fibrillation, hypertension, overactive bladder, anemia, asthma, hypothyroidism, CVA, CKD 4 presenting with altered mental status.  History obtained with assistance of patient's granddaughter and chart review due to patient's encephalopathy.  She was last noted to be normal yesterday morning.  Today she was noted to be confused and oriented x 1 only when at baseline she is oriented x 3 and conversant and able to perform ADLs.  She has a history of chronic cystitis and family was noting frequent trips to the bathroom without being able to go.  Patient unable to participate in full review of systems due to altered mentation.  ED Course: Vital signs in the ED significant for blood pressure in the 195K to 932I systolic.  Heart rate in the 50s to 60s.  Lab workup included CMP with creatinine stable at 1.44, glucose 100, protein 5.9, albumin 3.4.  CBC with hemoglobin stable at 10.4, platelets 140.  TSH normal.  Ethanol level negative.  Aspirin and Tylenol levels negative.  Urinalysis normal.  Urine culture pending.  Respiratory panel for flu and COVID positive.  Magnesium normal.  CT head without acute abnormality.  Chest x-ray with stable left greater than right pleural effusion and left basilar airspace disease.  Patient started on molnupiravir, Solu-Medrol, DuoNebs in the ED.  Review of Systems: Patient unable to participate in full review of systems due to altered mentation.  Past Medical History:  Diagnosis Date   Allergic rhinitis, cause unspecified    Anxiety state, unspecified    Arthritis    Colon polyp    Diaphragmatic hernia without mention of obstruction or gangrene     Diverticulosis of colon (without mention of hemorrhage)    Female stress incontinence    HTN (hypertension)    Insomnia, unspecified    Interstitial cystitis    surgery 02/01/2013   Irritable bowel syndrome    Memory loss    Other and unspecified hyperlipidemia    Other premature beats    Polyneuropathy in diabetes(357.2)    Postmenopausal bleeding    Proteinuria    Rectal ulcer    Skin cancer    [place] 2018   Type II or unspecified type diabetes mellitus with neurological manifestations, not stated as uncontrolled(250.60)    Unspecified essential hypertension    Viral warts, unspecified     Past Surgical History:  Procedure Laterality Date   ABDOMINAL HYSTERECTOMY  1977   [facility]   CATARACT EXTRACTION, BILATERAL  02/10/2006   Dr.Groat    COLONOSCOPY  07/17/2004   polypectomy   CYSTOSCOPY  1987   Dr.Bradley   LOOP RECORDER INSERTION N/A 07/31/2016   Procedure: Loop Recorder Insertion;  Surgeon: [name], MD;  Location: [facility];  Service: Cardiovascular;  Laterality: N/A;   TEE WITHOUT CARDIOVERSION N/A 07/30/2016   Procedure: TRANSESOPHAGEAL ECHOCARDIOGRAM (TEE);  Surgeon: [name], MD;  Location: [facility];  Service: Cardiovascular;  Laterality: N/A;   [place]  05/13/16   urinary bladder      surgery 02/01/2013 to stretch bladder stem    Social History  reports that she  quit smoking about 10 years ago. Her smoking use included cigarettes. She has a 15.50 pack-year smoking history. She has never used smokeless tobacco. She reports that she does not drink alcohol and does not use drugs.  Allergies  Allergen Reactions   Jardiance [Empagliflozin] Diarrhea   Metformin And Related Diarrhea   Codeine Nausea And Vomiting   Metoprolol Nausea And Vomiting   Relafen [Nabumetone] Nausea And Vomiting   Septra [Sulfamethoxazole-Trimethoprim] Nausea And Vomiting    Tetracycline Nausea And Vomiting   Adhesive [Tape] Rash and Other (See Comments)    Tears the skin also   Ceclor [Cefaclor] Other (See Comments)    Unknown reaction; not recalled   Cefaclor Other (See Comments)    Unknown reaction   Codeine Nausea And Vomiting   Jardiance [Empagliflozin] Diarrhea   Lopid [Gemfibrozil] Other (See Comments)    Unknown reaction; not recalled   Lopid [Gemfibrozil] Other (See Comments)    Unknown reaction   Lopressor [Metoprolol Tartrate] Nausea And Vomiting   Metformin And Related Diarrhea   Morphine And Related Other (See Comments)    Passed out   Morphine And Related Other (See Comments)    Passed out   Niacin Nausea And Vomiting    Unknown reaction Unknown reaction; not recalled   Niacin And Related Other (See Comments)    Unknown reaction; not recalled   Niacin And Related Other (See Comments)    Unknown reaction   Relafen [Nabumetone] Nausea And Vomiting   Septra [Sulfamethoxazole-Trimethoprim] Nausea And Vomiting   Tape Rash and Other (See Comments)    Tears skin   Tetracyclines & Related Nausea And Vomiting   Toprol Xl [Metoprolol Tartrate] Nausea And Vomiting   Toprol Xl [Metoprolol] Nausea And Vomiting    Family History  Problem Relation Age of Onset   Pancreatic cancer Mother 61   Heart attack Father    Bladder Cancer Father    Hypertension Sister    Diabetes Sister    COPD Sister    Hypertension Son    Diabetes Brother    Heart disease Brother    Hypertension Brother    Diabetes Brother    Heart disease Brother 8       Myocardial Infarction    Heart attack Brother   Reviewed on admission  Prior to Admission medications   Medication Sig Start Date End Date Taking? Authorizing Provider  ACCU-CHEK AVIVA PLUS test strip USE TO TEST BLOOD SUGAR TWICE DAILY. 09/11/18   Reed, [name] L, DO  acetaminophen (TYLENOL) 500 MG tablet Take 500 mg by mouth every 6 (six) hours as needed (pain).    [provider]  amLODipine  (NORVASC) 2.5 MG tablet Take 2.5 mg by mouth at bedtime. 09/06/21   [provider]  apixaban (ELIQUIS) 2.5 MG TABS tablet Take 1 tablet (2.5 mg total) by mouth 2 (two) times daily. 09/28/21   Minus Breeding, MD  atorvastatin (LIPITOR) 40 MG tablet TAKE 1 TABLET AT BEDTIME 01/04/22   [name], MD  budesonide (PULMICORT) 0.5 MG/2ML nebulizer solution USE 1 VIAL  IN  NEBULIZER TWICE  DAILY - - Rinse Mouth After Treatment 01/25/22   [name], MD  citalopram (CELEXA) 10 MG tablet TAKE 1 TABLET DAILY 02/18/22   [name], MD  Cyanocobalamin (VITAMIN B 12 PO) Take by mouth.    [provider]  famotidine (PEPCID) 20 MG tablet Take one tablet once to twice a day 02/05/22   Armbruster, Carlota Raspberry, MD  ferrous sulfate 325 (65 FE) MG tablet Take 1 tablet (325 mg total) by mouth 2 (two) times daily with a meal. 02/05/22   Armbruster, Carlota Raspberry, MD  fluticasone-salmeterol (ADVAIR) 250-50 MCG/ACT AEPB Inhale 1 puff into the lungs in the morning and at bedtime. 02/20/22   Binnie Rail, MD  furosemide (LASIX) 20 MG tablet Take 40 mg daily for 3 days and then take 20 mg daily 02/25/22   Binnie Rail, MD  gabapentin (NEURONTIN) 300 MG capsule Take 1 capsule (300 mg total) by mouth 3 (three) times daily. 08/14/21   [name], MD  hyoscyamine (LEVSIN SL) 0.125 MG SL tablet Place 1 tablet (0.125 mg total) under the tongue every 4 (four) hours as needed. Take tablet every 4-6 hours for abdominal pain and cramping 03/23/20   Levin Erp, [place]  ipratropium-albuterol (DUONEB) 0.5-2.5 (3) MG/3ML SOLN USE 1 VIAL IN NEBULIZER EVERY 6 HOURS - And As Needed 01/25/22   [name], MD  Lancets (ACCU-CHEK MULTICLIX) lancets Use to test blood sugar twice daily. E11.29 07/04/17   Reed, [name] L, DO  levocetirizine (XYZAL) 5 MG tablet Take 1 tablet (5 mg total) by mouth every evening. 01/23/22   [name], MD  lidocaine (LIDODERM) 5 % Place 1 patch onto the skin daily. Remove &  Discard patch within 12 hours or as directed by MD 07/23/21   Biagio Borg, MD  Nebulizer MISC 1 Act by Does not apply route daily. Dx J45.4 03/04/22   Binnie Rail, MD  NONFORMULARY OR COMPOUNDED ITEM Transport chair   #1    dx leg and hip pain s/p fall 11/10/20   Carollee Herter, Kendrick Fries R, DO  NONFORMULARY OR COMPOUNDED ITEM Rollator walker with seat    #1   dx gait abnormality 11/10/20   Carollee Herter, Alferd Apa, DO  potassium chloride SA (KLOR-CON M) 20 MEQ tablet Take 1 tablet (20 mEq total) by mouth daily. 02/25/22   Binnie Rail, MD  thiamine (VITAMIN B-1) 50 MG tablet Take 1 tablet (50 mg total) by mouth daily. 01/30/22   [name], MD  triamcinolone cream (KENALOG) 0.5 % APPLY 1 APPLICATION TOPICALLY TWICE A DAY 02/20/21   [name], MD  Vitamin D, Ergocalciferol, (DRISDOL) 1.25 MG (50000 UNIT) CAPS capsule TAKE 1 CAPSULE EVERY 7 DAYS 01/18/22   [name], MD  zinc gluconate 50 MG tablet Take 1 tablet (50 mg total) by mouth daily. 01/30/22   [name], MD    Physical Exam: Vitals:   03/12/22 1600 03/12/22 1615 03/12/22 1644 03/12/22 1800  BP: 102/89 (!) 171/73  (!) 165/99  Pulse: (!) 53 65  62  Resp: 17 13  (!) 23  Temp:   100.2 F (37.9 C)   TempSrc:   Oral   SpO2: 93% 95%  92%    Physical Exam Constitutional:      General: She is not in acute distress.    Appearance: Normal appearance.  HENT:     Head: Normocephalic and atraumatic.     Mouth/Throat:     Mouth: Mucous membranes are moist.     Pharynx: Oropharynx is clear.  Eyes:     Extraocular Movements: Extraocular movements intact.     Pupils: Pupils are equal, round, and reactive to light.  Cardiovascular:     Rate and Rhythm: Normal rate and regular rhythm.     Pulses: Normal pulses.     Heart sounds:  Normal heart sounds.  Pulmonary:     Effort: Pulmonary effort is normal. No respiratory distress.     Breath sounds: Wheezing (Trace) present.  Abdominal:     General: Bowel sounds are normal.  There is no distension.     Palpations: Abdomen is soft.     Tenderness: There is no abdominal tenderness.  Musculoskeletal:        General: No swelling or deformity.  Skin:    General: Skin is warm and dry.  Neurological:     General: No focal deficit present.     Comments: Alert and oriented x 1    Labs on Admission: I have personally reviewed following labs and imaging studies  CBC: Recent Labs  Lab 03/12/22 1325  WBC 4.0  NEUTROABS 2.6  HGB 10.4*  HCT 32.0*  MCV 100.3*  PLT 140*    Basic Metabolic Panel: Recent Labs  Lab 03/12/22 1325  NA 137  K 3.8  CL 102  CO2 27  GLUCOSE 100*  BUN 17  CREATININE 1.44*  CALCIUM 9.8  MG 1.9    GFR: Estimated Creatinine Clearance: 25.3 mL/min (A) (by C-G formula based on SCr of 1.44 mg/dL (H)).  Liver Function Tests: Recent Labs  Lab 03/12/22 1325  AST 20  ALT 11  ALKPHOS 63  BILITOT 0.3  PROT 5.9*  ALBUMIN 3.4*    Urine analysis:    Component Value Date/Time   COLORURINE YELLOW 03/12/2022 1647   APPEARANCEUR HAZY (A) 03/12/2022 1647   APPEARANCEUR Cloudy (A) 12/17/2013 0906   LABSPEC 1.009 03/12/2022 1647   PHURINE 8.0 03/12/2022 1647   GLUCOSEU NEGATIVE 03/12/2022 1647   GLUCOSEU NEGATIVE 08/08/2021 1627   HGBUR NEGATIVE 03/12/2022 1647   BILIRUBINUR NEGATIVE 03/12/2022 1647   BILIRUBINUR Negative 12/17/2013 0906   KETONESUR NEGATIVE 03/12/2022 1647   PROTEINUR 30 (A) 03/12/2022 1647   UROBILINOGEN 0.2 08/08/2021 1627   NITRITE NEGATIVE 03/12/2022 1647   LEUKOCYTESUR NEGATIVE 03/12/2022 1647    Radiological Exams on Admission: CT Head Wo Contrast  Result Date: 03/12/2022 CLINICAL DATA:  Mental status change, unknown cause. EXAM: CT HEAD WITHOUT CONTRAST TECHNIQUE: Contiguous axial images were obtained from the base of the skull through the vertex without intravenous contrast. RADIATION DOSE REDUCTION: This exam was performed according to the departmental dose-optimization program which includes  automated exposure control, adjustment of the mA and/or kV according to patient size and/or use of iterative reconstruction technique. COMPARISON:  CT head dated July 16, 2021 FINDINGS: Brain: No evidence of acute infarction, hemorrhage, hydrocephalus, extra-axial collection or mass lesion/mass effect. Small infarct of the left occipital lobe is unchanged. Advanced chronic microvascular ischemic changes of the periventricular and subcortical white matter. Vascular: No hyperdense vessel or unexpected calcification. Skull: Normal. Negative for fracture or focal lesion. Sinuses/Orbits: No acute finding. Other: None. IMPRESSION: 1. No acute intracranial abnormality. 2. Advanced chronic microvascular ischemic changes of the periventricular and subcortical white matter. Electronically Signed   By: Keane Police D.O.   On: 03/12/2022 17:59   DG Chest 2 View  Result Date: 03/12/2022 CLINICAL DATA:  Cough with altered mental status for 1 day. EXAM: CHEST - 2 VIEW COMPARISON:  Radiographs 02/20/2022 and 07/16/2021.  CT 03/08/2022. FINDINGS: 1522 hours. The heart size and mediastinal contours are stable with aortic atherosclerosis. Unchanged left greater than right pleural effusions with associated left basilar airspace disease. There is possible mild pulmonary edema. No pneumothorax or acute osseous abnormality. Telemetry leads overlie the chest. IMPRESSION: Unchanged left greater  than right pleural effusions and left basilar airspace disease. Possible mild pulmonary edema. Electronically Signed   By: Richardean Sale M.D.   On: 03/12/2022 15:44    EKG: Independently reviewed.  Atrial fibrillation at 51 bpm.  Nonspecific intraventricular conduction delay with QRS of 124.  Nonspecific T wave changes.  Assessment/Plan Principal Problem:   Acute encephalopathy Active Problems:   Essential hypertension   DM (diabetes mellitus), type 2 with renal complications (HCC)   History of CVA (cerebrovascular accident)    Paroxysmal atrial fibrillation (HCC)   Diabetic polyneuropathy associated with type 2 diabetes mellitus ([place])   Acquired hypothyroidism   Chronic renal disease, stage 4, severely decreased glomerular filtration rate (GFR) between 15-29 mL/min/1.73 square meter (HCC)   COVID-19 virus infection   Asthma, chronic, unspecified asthma severity, with acute exacerbation   Acute encephalopathy COVID-19 infection Mild asthma exacerbation > Patient presenting with altered mental status for the past day or less.  Family had some concern for possible recurrent UTI given her history and frequent trips to the bathroom. > In the ED workup showed normal urinalysis.  No leukocytosis.  Chest x-ray stable but did show chronic pleural effusion and basilar airspace disease.  CT head without acute abnormality either.  No significant electrolyte derangements. > Respiratory screening did come back positive for COVID-19 infection which is presumed etiology of encephalopathy.  She was also noted to be wheezing in the ED and received steroids and breathing treatment.  Also started on molnupiravir. - Monitor on telemetry - Continue daily steroids - Scheduled Atrovent - As needed albuterol - Continue molnupiravir - Trend daily labs  Hypertension - Continue home amlodipine - Continue home Lasix  History of CVA - Continue home atorvastatin - Continue home Eliquis  Atrial fibrillation - Continue home Eliquis - Not on rate control or rhythm control medication currently  Neuropathy - Continue home gabapentin  Diabetes - SSI  Anemia > Hemoglobin stable in the ED - Continue to trend CBC - Continue with home zinc and thiamine  CKD 4 > Creatinine stable at 1.44 in the ED. - Continue to trend renal function electrolytes  DVT prophylaxis: Eliquis Code Status:   DNR Family Communication:  Updated at bedside  Disposition Plan:   Patient is from:  Home  Anticipated DC to:  Home  Anticipated DC date:  1 to  3 days  Anticipated DC barriers: None  Consults called:  None Admission status:  Observation, telemetry  Severity of Illness: The appropriate patient status for this patient is OBSERVATION. Observation status is judged to be reasonable and necessary in order to provide the required intensity of service to ensure the patient's safety. The patient's presenting symptoms, physical exam findings, and initial radiographic and laboratory data in the context of their medical condition is felt to place them at decreased risk for further clinical deterioration. Furthermore, it is anticipated that the patient will be medically stable for discharge from the hospital within 2 midnights of admission.    Marcelyn Bruins MD [facility]  How to contact the [facility] Attending or Consulting provider [place] or covering provider during after hours [place], for this patient?   Check the care team in [facility] and look for a) attending/consulting [facility] provider listed and b) the [facility] team listed Log into [email or web address] and use [place]'s universal password to access. If you do not have the password, please contact the hospital operator. Locate the [facility] provider you are looking for under [facility] and  page to a number that you can be directly reached. If you still have difficulty reaching the provider, please page the [facility] (Director on Call) for the Hospitalists listed on amion for assistance.  03/12/2022, 7:25 PM

## 2022-03-13 DIAGNOSIS — N1831 Chronic kidney disease, stage 3a: Secondary | ICD-10-CM | POA: Diagnosis not present

## 2022-03-13 DIAGNOSIS — N1832 Chronic kidney disease, stage 3b: Secondary | ICD-10-CM | POA: Diagnosis present

## 2022-03-13 DIAGNOSIS — J9601 Acute respiratory failure with hypoxia: Secondary | ICD-10-CM | POA: Diagnosis present

## 2022-03-13 DIAGNOSIS — I499 Cardiac arrhythmia, unspecified: Secondary | ICD-10-CM | POA: Diagnosis not present

## 2022-03-13 DIAGNOSIS — N184 Chronic kidney disease, stage 4 (severe): Secondary | ICD-10-CM | POA: Diagnosis present

## 2022-03-13 DIAGNOSIS — I10 Essential (primary) hypertension: Secondary | ICD-10-CM | POA: Diagnosis present

## 2022-03-13 DIAGNOSIS — R4182 Altered mental status, unspecified: Secondary | ICD-10-CM | POA: Diagnosis present

## 2022-03-13 DIAGNOSIS — Z8673 Personal history of transient ischemic attack (TIA), and cerebral infarction without residual deficits: Secondary | ICD-10-CM | POA: Diagnosis not present

## 2022-03-13 DIAGNOSIS — J45901 Unspecified asthma with (acute) exacerbation: Secondary | ICD-10-CM | POA: Diagnosis present

## 2022-03-13 DIAGNOSIS — E1142 Type 2 diabetes mellitus with diabetic polyneuropathy: Secondary | ICD-10-CM | POA: Diagnosis present

## 2022-03-13 DIAGNOSIS — M6281 Muscle weakness (generalized): Secondary | ICD-10-CM | POA: Diagnosis present

## 2022-03-13 DIAGNOSIS — Z7401 Bed confinement status: Secondary | ICD-10-CM | POA: Diagnosis not present

## 2022-03-13 DIAGNOSIS — R0602 Shortness of breath: Secondary | ICD-10-CM | POA: Diagnosis not present

## 2022-03-13 DIAGNOSIS — I4819 Other persistent atrial fibrillation: Secondary | ICD-10-CM | POA: Diagnosis present

## 2022-03-13 DIAGNOSIS — G9349 Other encephalopathy: Secondary | ICD-10-CM | POA: Diagnosis present

## 2022-03-13 DIAGNOSIS — J9811 Atelectasis: Secondary | ICD-10-CM | POA: Diagnosis not present

## 2022-03-13 DIAGNOSIS — I951 Orthostatic hypotension: Secondary | ICD-10-CM | POA: Diagnosis not present

## 2022-03-13 DIAGNOSIS — J1282 Pneumonia due to coronavirus disease 2019: Secondary | ICD-10-CM | POA: Diagnosis present

## 2022-03-13 DIAGNOSIS — J90 Pleural effusion, not elsewhere classified: Secondary | ICD-10-CM | POA: Diagnosis not present

## 2022-03-13 DIAGNOSIS — Z743 Need for continuous supervision: Secondary | ICD-10-CM | POA: Diagnosis not present

## 2022-03-13 DIAGNOSIS — E8809 Other disorders of plasma-protein metabolism, not elsewhere classified: Secondary | ICD-10-CM | POA: Diagnosis present

## 2022-03-13 DIAGNOSIS — Z85828 Personal history of other malignant neoplasm of skin: Secondary | ICD-10-CM | POA: Diagnosis not present

## 2022-03-13 DIAGNOSIS — G9340 Encephalopathy, unspecified: Secondary | ICD-10-CM | POA: Diagnosis present

## 2022-03-13 DIAGNOSIS — E785 Hyperlipidemia, unspecified: Secondary | ICD-10-CM | POA: Diagnosis present

## 2022-03-13 DIAGNOSIS — D696 Thrombocytopenia, unspecified: Secondary | ICD-10-CM | POA: Diagnosis not present

## 2022-03-13 DIAGNOSIS — U071 COVID-19: Secondary | ICD-10-CM | POA: Diagnosis present

## 2022-03-13 DIAGNOSIS — I480 Paroxysmal atrial fibrillation: Secondary | ICD-10-CM | POA: Diagnosis not present

## 2022-03-13 DIAGNOSIS — E039 Hypothyroidism, unspecified: Secondary | ICD-10-CM | POA: Diagnosis present

## 2022-03-13 DIAGNOSIS — I129 Hypertensive chronic kidney disease with stage 1 through stage 4 chronic kidney disease, or unspecified chronic kidney disease: Secondary | ICD-10-CM | POA: Diagnosis present

## 2022-03-13 DIAGNOSIS — E1165 Type 2 diabetes mellitus with hyperglycemia: Secondary | ICD-10-CM | POA: Diagnosis present

## 2022-03-13 DIAGNOSIS — Z7901 Long term (current) use of anticoagulants: Secondary | ICD-10-CM | POA: Diagnosis not present

## 2022-03-13 DIAGNOSIS — D631 Anemia in chronic kidney disease: Secondary | ICD-10-CM | POA: Diagnosis present

## 2022-03-13 DIAGNOSIS — R404 Transient alteration of awareness: Secondary | ICD-10-CM | POA: Diagnosis not present

## 2022-03-13 DIAGNOSIS — Z87891 Personal history of nicotine dependence: Secondary | ICD-10-CM | POA: Diagnosis not present

## 2022-03-13 DIAGNOSIS — I272 Pulmonary hypertension, unspecified: Secondary | ICD-10-CM | POA: Diagnosis present

## 2022-03-13 DIAGNOSIS — E1149 Type 2 diabetes mellitus with other diabetic neurological complication: Secondary | ICD-10-CM | POA: Diagnosis present

## 2022-03-13 DIAGNOSIS — R296 Repeated falls: Secondary | ICD-10-CM | POA: Diagnosis present

## 2022-03-13 DIAGNOSIS — R42 Dizziness and giddiness: Secondary | ICD-10-CM | POA: Diagnosis not present

## 2022-03-13 DIAGNOSIS — Z66 Do not resuscitate: Secondary | ICD-10-CM | POA: Diagnosis present

## 2022-03-13 DIAGNOSIS — E1122 Type 2 diabetes mellitus with diabetic chronic kidney disease: Secondary | ICD-10-CM | POA: Diagnosis present

## 2022-03-13 LAB — CBC WITH DIFFERENTIAL/PLATELET
Abs Immature Granulocytes: 0.02 10*3/uL (ref 0.00–0.07)
Basophils Absolute: 0 10*3/uL (ref 0.0–0.1)
Basophils Relative: 0 %
Eosinophils Absolute: 0 10*3/uL (ref 0.0–0.5)
Eosinophils Relative: 0 %
HCT: 35 % — ABNORMAL LOW (ref 36.0–46.0)
Hemoglobin: 11.7 g/dL — ABNORMAL LOW (ref 12.0–15.0)
Immature Granulocytes: 1 %
Lymphocytes Relative: 14 %
Lymphs Abs: 0.5 10*3/uL — ABNORMAL LOW (ref 0.7–4.0)
MCH: 33.2 pg (ref 26.0–34.0)
MCHC: 33.4 g/dL (ref 30.0–36.0)
MCV: 99.4 fL (ref 80.0–100.0)
Monocytes Absolute: 0.1 10*3/uL (ref 0.1–1.0)
Monocytes Relative: 3 %
Neutro Abs: 3.1 10*3/uL (ref 1.7–7.7)
Neutrophils Relative %: 82 %
Platelets: 133 10*3/uL — ABNORMAL LOW (ref 150–400)
RBC: 3.52 MIL/uL — ABNORMAL LOW (ref 3.87–5.11)
RDW: 12.9 % (ref 11.5–15.5)
WBC: 3.8 10*3/uL — ABNORMAL LOW (ref 4.0–10.5)
nRBC: 0 % (ref 0.0–0.2)

## 2022-03-13 LAB — COMPREHENSIVE METABOLIC PANEL
ALT: 16 U/L (ref 0–44)
AST: 26 U/L (ref 15–41)
Albumin: 3.4 g/dL — ABNORMAL LOW (ref 3.5–5.0)
Alkaline Phosphatase: 66 U/L (ref 38–126)
Anion gap: 11 (ref 5–15)
BUN: 17 mg/dL (ref 8–23)
CO2: 25 mmol/L (ref 22–32)
Calcium: 9.7 mg/dL (ref 8.9–10.3)
Chloride: 100 mmol/L (ref 98–111)
Creatinine, Ser: 1.27 mg/dL — ABNORMAL HIGH (ref 0.44–1.00)
GFR, Estimated: 41 mL/min — ABNORMAL LOW (ref >60)
Glucose, Bld: 160 mg/dL — ABNORMAL HIGH (ref 70–99)
Potassium: 3.7 mmol/L (ref 3.5–5.1)
Sodium: 136 mmol/L (ref 135–145)
Total Bilirubin: 0.4 mg/dL (ref 0.3–1.2)
Total Protein: 6 g/dL — ABNORMAL LOW (ref 6.5–8.1)

## 2022-03-13 LAB — CBG MONITORING, ED
Glucose-Capillary: 152 mg/dL — ABNORMAL HIGH (ref 70–99)
Glucose-Capillary: 254 mg/dL — ABNORMAL HIGH (ref 70–99)
Glucose-Capillary: 258 mg/dL — ABNORMAL HIGH (ref 70–99)
Glucose-Capillary: 219 mg/dL — ABNORMAL HIGH (ref 70–99)
Glucose-Capillary: 104 mg/dL — ABNORMAL HIGH (ref 70–99)

## 2022-03-13 LAB — C-REACTIVE PROTEIN: CRP: <0.5 mg/dL (ref <1.0)

## 2022-03-13 LAB — FERRITIN: Ferritin: 20 ng/mL (ref 11–307)

## 2022-03-13 NOTE — ED Notes (Signed)
Per [facility] DO, wean pt off of O2. Pt O2 turned off at this time and will continue to monitor pt

## 2022-03-13 NOTE — Progress Notes (Signed)
PROGRESS NOTE    SHALEEN Miller  HBZ:[id number] DOB: 08-Jun-1931 DOA: 03/12/2022 PCP: Penny Lima, MD   Brief Narrative:  HPI per Dr. Neva Seat Penny Miller is a 88 y.o. female with medical history significant of spinal stenosis, sinus node dysfunction, diabetes, neuropathy, atrial fibrillation, hypertension, overactive bladder, anemia, asthma, hypothyroidism, CVA, CKD 4 presenting with altered mental status.   History obtained with assistance of patient's granddaughter and chart review due to patient's encephalopathy.  She was last noted to be normal yesterday morning.  Today she was noted to be confused and oriented x 1 only when at baseline she is oriented x 3 and conversant and able to perform ADLs.  She has a history of chronic cystitis and family was noting frequent trips to the bathroom without being able to go.   Patient unable to participate in full review of systems due to altered mentation.   ED Course: Vital signs in the ED significant for blood pressure in the 101B to 510C systolic.  Heart rate in the 50s to 60s.  Lab workup included CMP with creatinine stable at 1.44, glucose 100, protein 5.9, albumin 3.4.  CBC with hemoglobin stable at 10.4, platelets 140.  TSH normal.  Ethanol level negative.  Aspirin and Tylenol levels negative.  Urinalysis normal.  Urine culture pending.  Respiratory panel for flu and COVID positive.  Magnesium normal.  CT head without acute abnormality.  Chest x-ray with stable left greater than right pleural effusion and left basilar airspace disease.  Patient started on molnupiravir, Solu-Medrol, DuoNebs in the ED.   Assessment and Plan:  Acute encephalopathy COVID-19 infection Mild asthma exacerbation > Patient presenting with altered mental status for the past day or less.  Family had some concern for possible recurrent UTI given her history and frequent trips to the bathroom. > In the ED workup showed normal urinalysis.  No leukocytosis.   Chest x-ray stable but did show chronic pleural effusion and basilar airspace disease.  CT head without acute abnormality either.  No significant electrolyte derangements. > Respiratory screening did come back positive for COVID-19 infection which is presumed etiology of encephalopathy.  She was also noted to be wheezing in the ED and received steroids and breathing treatment.  Also started on molnupiravir. -Continue to monitor on telemetry - Continue daily steroids and is now on prednisone 50 mg p.o. daily -COVID-19 Labs  Recent Labs    03/13/22 0449  FERRITIN 20  CRP <0.5    Lab Results  Component Value Date   SARSCOV2NAA POSITIVE (A) 03/12/2022   [place] NEGATIVE 07/16/2021   [place] NEGATIVE 11/20/2020  -SpO2: 97 % O2 Flow Rate (L/min): 2 L/min (not baseline) - Scheduled Atrovent - As needed albuterol -Leukopenia is likely from COVID infection - Continue molnupiravir for 5 days total -She will need an amatory home O2 screen prior to discharge - Trend daily labs and inflammatory markers -Need PT and OT to further evaluate and treat   Hypertension - Continue home amlodipine - Continue home Lasix  Thrombocytopenia the patient's platelet count has gone from 140s now 133   History of CVA - Continue home atorvastatin - Continue home Eliquis   Atrial fibrillation - Continue home apixaban - Not on rate control or rhythm control medication currently   Neuropathy - Continue home gabapentin   Diabetes - SSI and his CBGs likely will be elevated in the setting of steroid demargination -CBGs ranging from 152-258   Normocytic/Macrocytic Anemia > Hemoglobin stable in  the ED - Continue to trend CBC - Continue with home zinc and thiamine   CKD 3B > Creatinine stable at 1.44 in the ED. is now showing BUNs/creatinine of 17/1.27 -Avoid further nephrotoxic medications, contrast dyes, hypotension and dehydration ensure adequate renal  perfusion  Hypoalbuminemia -Patient's albumin level is now 3.25.   -Continue to monitor and trend and repeat CMP in a.m.   DVT prophylaxis: apixaban (ELIQUIS) tablet 2.5 mg Start: 03/12/22 2200 apixaban (ELIQUIS) tablet 2.5 mg    Code Status: DNR Family Communication: Called patient's Daughter Penny Miller with no answer  Disposition Plan:  Level of care: Telemetry Medical Status is: Inpatient Remains inpatient appropriate because: [place] OT to further evaluate and treat as she is slowly improving   Consultants:  None  Procedures:  None  Antimicrobials:  Anti-infectives (From admission, onward)    Start     Dose/Rate Route Frequency Ordered Stop   03/12/22 1900  molnupiravir EUA (LAGEVRIO) capsule 800 mg        4 capsule Oral 2 times daily 03/12/22 1800 03/17/22 2159       Subjective: Seen and examined and was feeling slightly better she said.  States that her breathing is little bit better.  Not as confused.  Denies any chest pain but felt okay just fatigued.  No other concerns or complaints this time.  Objective: Vitals:   03/13/22 1220 03/13/22 1445 03/13/22 1700 03/13/22 1716  BP: (!) 146/65 (!) 157/71 (!) 150/73   Pulse: (!) 57 (!) 55 (!) 43   Resp: '14 18 15   '$ Temp: 98.1 F (36.7 C)   97.7 F (36.5 C)  TempSrc: Oral     SpO2: 95% 95% 97%   Weight:      Height:       No intake or output data in the 24 hours ending 03/13/22 1830 Filed Weights   03/12/22 2134  Weight: 69.9 kg   Examination: Physical Exam:  Constitutional: WN/WD elderly Caucasian female currently no acute distress Respiratory: Diminished to auscultation bilaterally with coarse breath sounds and some slight expiratory wheezing., no rales, rhonchi or crackles. Normal respiratory effort and patient is not tachypenic. No accessory muscle use.  Unlabored breathing but wearing supplemental oxygen via nasal cannula Cardiovascular: RRR, no murmurs / rubs / gallops. S1 and S2 auscultated.  Minimal extremity  edema Abdomen: Soft, non-tender, non-distended. Bowel sounds positive.  GU: Deferred. Musculoskeletal: No clubbing / cyanosis of digits/nails. No joint deformity upper and lower extremities.  Skin: No rashes, lesions, ulcers on limited skin evaluation. No induration; Warm and dry.  Neurologic: CN 2-12 grossly intact with no focal deficits.  Romberg sign and cerebellar reflexes not assessed.  Psychiatric: Normal judgment and insight. Alert and oriented x 3. Normal mood and appropriate affect.   Data Reviewed: I have personally reviewed following labs and imaging studies  CBC: Recent Labs  Lab 03/12/22 1325 03/13/22 0449  WBC 4.0 3.8*  NEUTROABS 2.6 3.1  HGB 10.4* 11.7*  HCT 32.0* 35.0*  MCV 100.3* 99.4  PLT 140* 341*   Basic Metabolic Panel: Recent Labs  Lab 03/12/22 1325 03/13/22 0449  NA 137 136  K 3.8 3.7  CL 102 100  CO2 27 25  GLUCOSE 100* 160*  BUN 17 17  CREATININE 1.44* 1.27*  CALCIUM 9.8 9.7  MG 1.9  --    GFR: Estimated Creatinine Clearance: 28.7 mL/min (A) (by C-G formula based on SCr of 1.27 mg/dL (H)). Liver Function Tests: Recent Labs  Lab 03/12/22 1325  03/13/22 0449  AST 20 26  ALT 11 16  ALKPHOS 63 66  BILITOT 0.3 0.4  PROT 5.9* 6.0*  ALBUMIN 3.4* 3.4*   No results for input(s): "LIPASE", "AMYLASE" in the last 168 hours. No results for input(s): "AMMONIA" in the last 168 hours. Coagulation Profile: No results for input(s): "INR", "PROTIME" in the last 168 hours. Cardiac Enzymes: No results for input(s): "CKTOTAL", "CKMB", "CKMBINDEX", "TROPONINI" in the last 168 hours. BNP (last 3 results) Recent Labs    02/20/22 1150 03/04/22 0958  PROBNP 1,012.0* 863.0*   HbA1C: No results for input(s): "HGBA1C" in the last 72 hours. CBG: Recent Labs  Lab 03/13/22 0736 03/13/22 1219 03/13/22 1441 03/13/22 1706  GLUCAP 152* 254* 258* 219*   Lipid Profile: No results for input(s): "CHOL", "HDL", "LDLCALC", "TRIG", "CHOLHDL", "LDLDIRECT" in the  last 72 hours. Thyroid Function Tests: Recent Labs    03/12/22 1640  TSH 4.247   Anemia Panel: Recent Labs    03/13/22 0449  FERRITIN 20   Sepsis Labs: No results for input(s): "PROCALCITON", "LATICACIDVEN" in the last 168 hours.  Recent Results (from the past 240 hour(s))  Resp panel by RT-PCR (RSV, Flu A&B, Covid) Anterior Nasal Swab     Status: Abnormal   Collection Time: 03/12/22  2:47 PM   Specimen: Anterior Nasal Swab  Result Value Ref Range Status   SARS Coronavirus 2 by RT PCR POSITIVE (A) NEGATIVE Final    Comment: (NOTE) SARS-CoV-2 target nucleic acids are DETECTED.  The SARS-CoV-2 RNA is generally detectable in upper respiratory specimens during the acute phase of infection. Positive results are indicative of the presence of the identified virus, but do not rule out bacterial infection or co-infection with other pathogens not detected by the test. Clinical correlation with patient history and other diagnostic information is necessary to determine patient infection status. The expected result is Negative.  Fact Sheet for Patients: EntrepreneurPulse.com.au  Fact Sheet for Healthcare Providers: IncredibleEmployment.be  This test is not yet approved or cleared by the [place] FDA and  has been authorized for detection and/or diagnosis of SARS-CoV-2 by FDA under an Emergency Use Authorization (EUA).  This EUA will remain in effect (meaning this test can be used) for the duration of  the COVID-19 declaration under Section 564(b)(1) of the A ct, 21 U.S.C. section 360bbb-3(b)(1), unless the authorization is terminated or revoked sooner.     Influenza A by PCR NEGATIVE NEGATIVE Final   Influenza B by PCR NEGATIVE NEGATIVE Final    Comment: (NOTE) The Xpert Xpress SARS-CoV-2/FLU/RSV plus assay is intended as an aid in the diagnosis of influenza from Nasopharyngeal swab specimens and should not be used as a sole basis for  treatment. Nasal washings and aspirates are unacceptable for Xpert Xpress SARS-CoV-2/FLU/RSV testing.  Fact Sheet for Patients: EntrepreneurPulse.com.au  Fact Sheet for Healthcare Providers: IncredibleEmployment.be  This test is not yet approved or cleared by the [place] FDA and has been authorized for detection and/or diagnosis of SARS-CoV-2 by FDA under an Emergency Use Authorization (EUA). This EUA will remain in effect (meaning this test can be used) for the duration of the COVID-19 declaration under Section 564(b)(1) of the Act, 21 U.S.C. section 360bbb-3(b)(1), unless the authorization is terminated or revoked.     Resp Syncytial Virus by PCR NEGATIVE NEGATIVE Final    Comment: (NOTE) Fact Sheet for Patients: EntrepreneurPulse.com.au  Fact Sheet for Healthcare Providers: IncredibleEmployment.be  This test is not yet approved or cleared by the [place]  FDA and has been authorized for detection and/or diagnosis of SARS-CoV-2 by FDA under an Emergency Use Authorization (EUA). This EUA will remain in effect (meaning this test can be used) for the duration of the COVID-19 declaration under Section 564(b)(1) of the Act, 21 U.S.C. section 360bbb-3(b)(1), unless the authorization is terminated or revoked.  Performed at [facility], [place] [address] [place], [place] [address]   Urine Culture     Status: Abnormal (Preliminary result)   Collection Time: 03/12/22  4:48 PM   Specimen: In/Out Cath Urine  Result Value Ref Range Status   Specimen Description IN/OUT CATH URINE  Final   Special Requests NONE  Final   Culture (A)  Final    >=100,000 COLONIES/mL DIPHTHEROIDS(CORYNEBACTERIUM SPECIES) Standardized susceptibility testing for this organism is not available. 20,000 COLONIES/mL GRAM POSITIVE COCCI CULTURE REINCUBATED FOR BETTER GROWTH Performed at [facility], [place] [address], [place], [place] [address]    Report Status PENDING  Incomplete     Radiology Studies: CT Head Wo Contrast  Result Date: 03/12/2022 CLINICAL DATA:  Mental status change, unknown cause. EXAM: CT HEAD WITHOUT CONTRAST TECHNIQUE: Contiguous axial images were obtained from the base of the skull through the vertex without intravenous contrast. RADIATION DOSE REDUCTION: This exam was performed according to the departmental dose-optimization program which includes automated exposure control, adjustment of the mA and/or kV according to patient size and/or use of iterative reconstruction technique. COMPARISON:  CT head dated July 16, 2021 FINDINGS: Brain: No evidence of acute infarction, hemorrhage, hydrocephalus, extra-axial collection or mass lesion/mass effect. Small infarct of the left occipital lobe is unchanged. Advanced chronic microvascular ischemic changes of the periventricular and subcortical white matter. Vascular: No hyperdense vessel or unexpected calcification. Skull: Normal. Negative for fracture or focal lesion. Sinuses/Orbits: No acute finding. Other: None. IMPRESSION: 1. No acute intracranial abnormality. 2. Advanced chronic microvascular ischemic changes of the periventricular and subcortical white matter. Electronically Signed   By: Keane Police D.O.   On: 03/12/2022 17:59   DG Chest 2 View  Result Date: 03/12/2022 CLINICAL DATA:  Cough with altered mental status for 1 day. EXAM: CHEST - 2 VIEW COMPARISON:  Radiographs 02/20/2022 and 07/16/2021.  CT 03/08/2022. FINDINGS: 1522 hours. The heart size and mediastinal contours are stable with aortic atherosclerosis. Unchanged left greater than right pleural effusions with associated left basilar airspace disease. There is possible mild pulmonary edema. No pneumothorax or acute osseous abnormality. Telemetry leads overlie the chest. IMPRESSION: Unchanged left greater than right pleural effusions and left basilar airspace disease. Possible mild  pulmonary edema. Electronically Signed   By: Richardean Sale M.D.   On: 03/12/2022 15:44   Scheduled Meds:  amLODipine  2.5 mg Oral QHS   apixaban  2.5 mg Oral BID   atorvastatin  40 mg Oral QHS   citalopram  10 mg Oral Daily   furosemide  40 mg Oral Daily   gabapentin  300 mg Oral TID   insulin aspart  0-9 Units Subcutaneous TID WC   ipratropium  0.5 mg Nebulization Q6H   molnupiravir EUA  4 capsule Oral BID   mometasone-formoterol  2 puff Inhalation BID   predniSONE  50 mg Oral Q breakfast   sodium chloride flush  3 mL Intravenous Q12H   thiamine  50 mg Oral Daily   zinc sulfate  220 mg Oral Daily   Continuous Infusions:   LOS: 0 days   Raiford Noble, DO [facility] Available via [employer]  secure chat 7am-7pm After these hours, please refer to coverage provider listed on [email or web address] 03/13/2022, 6:30 PM

## 2022-03-13 NOTE — ED Notes (Addendum)
Notified Sheikh DO about pt being 91-92% RA at this time, Per DO maintain sats >90%, can give supplemental O2 for comfort prn.

## 2022-03-13 NOTE — ED Notes (Signed)
DO also notified and aware of pt brady upper 30's to mid 40's. Pt is asymptomatic.

## 2022-03-13 NOTE — ED Notes (Signed)
Assumed pt care at this time and received report from Amy RN at this time

## 2022-03-14 ENCOUNTER — Inpatient Hospital Stay (HOSPITAL_COMMUNITY): Payer: Medicare Other

## 2022-03-14 LAB — CBC WITH DIFFERENTIAL/PLATELET
Abs Immature Granulocytes: 0.02 10*3/uL (ref 0.00–0.07)
Basophils Absolute: 0 10*3/uL (ref 0.0–0.1)
Basophils Relative: 0 %
Eosinophils Absolute: 0 10*3/uL (ref 0.0–0.5)
Eosinophils Relative: 0 %
HCT: 32.1 % — ABNORMAL LOW (ref 36.0–46.0)
Hemoglobin: 10.2 g/dL — ABNORMAL LOW (ref 12.0–15.0)
Immature Granulocytes: 0 %
Lymphocytes Relative: 13 %
Lymphs Abs: 0.8 10*3/uL (ref 0.7–4.0)
MCH: 32.5 pg (ref 26.0–34.0)
MCHC: 31.8 g/dL (ref 30.0–36.0)
MCV: 102.2 fL — ABNORMAL HIGH (ref 80.0–100.0)
Monocytes Absolute: 0.9 10*3/uL (ref 0.1–1.0)
Monocytes Relative: 15 %
Neutro Abs: 4.3 10*3/uL (ref 1.7–7.7)
Neutrophils Relative %: 72 %
Platelets: 120 10*3/uL — ABNORMAL LOW (ref 150–400)
RBC: 3.14 MIL/uL — ABNORMAL LOW (ref 3.87–5.11)
RDW: 12.7 % (ref 11.5–15.5)
WBC: 6.1 10*3/uL (ref 4.0–10.5)
nRBC: 0 % (ref 0.0–0.2)

## 2022-03-14 LAB — COMPREHENSIVE METABOLIC PANEL
ALT: 10 U/L (ref 0–44)
AST: 21 U/L (ref 15–41)
Albumin: 2.6 g/dL — ABNORMAL LOW (ref 3.5–5.0)
Alkaline Phosphatase: 51 U/L (ref 38–126)
Anion gap: 6 (ref 5–15)
BUN: 26 mg/dL — ABNORMAL HIGH (ref 8–23)
CO2: 28 mmol/L (ref 22–32)
Calcium: 8.2 mg/dL — ABNORMAL LOW (ref 8.9–10.3)
Chloride: 105 mmol/L (ref 98–111)
Creatinine, Ser: 1.33 mg/dL — ABNORMAL HIGH (ref 0.44–1.00)
GFR, Estimated: 38 mL/min — ABNORMAL LOW (ref >60)
Glucose, Bld: 107 mg/dL — ABNORMAL HIGH (ref 70–99)
Potassium: 3.2 mmol/L — ABNORMAL LOW (ref 3.5–5.1)
Sodium: 139 mmol/L (ref 135–145)
Total Bilirubin: 0.3 mg/dL (ref 0.3–1.2)
Total Protein: 4.9 g/dL — ABNORMAL LOW (ref 6.5–8.1)

## 2022-03-14 LAB — SEDIMENTATION RATE: Sed Rate: 6 mm/hr (ref 0–22)

## 2022-03-14 LAB — PHOSPHORUS: Phosphorus: 4 mg/dL (ref 2.5–4.6)

## 2022-03-14 LAB — CBG MONITORING, ED
Glucose-Capillary: 94 mg/dL (ref 70–99)
Glucose-Capillary: 135 mg/dL — ABNORMAL HIGH (ref 70–99)

## 2022-03-14 LAB — MAGNESIUM: Magnesium: 1.8 mg/dL (ref 1.7–2.4)

## 2022-03-14 LAB — LACTATE DEHYDROGENASE: LDH: 202 U/L — ABNORMAL HIGH (ref 98–192)

## 2022-03-14 LAB — FIBRINOGEN: Fibrinogen: 260 mg/dL (ref 210–475)

## 2022-03-14 LAB — C-REACTIVE PROTEIN: CRP: 0.6 mg/dL (ref <1.0)

## 2022-03-14 LAB — D-DIMER, QUANTITATIVE: D-Dimer, Quant: 1.16 ug/mL-FEU — ABNORMAL HIGH (ref 0.00–0.50)

## 2022-03-14 LAB — FERRITIN: Ferritin: 22 ng/mL (ref 11–307)

## 2022-03-14 MED ORDER — MAGNESIUM SULFATE 2 GM/50ML IV SOLN
2.0000 g | Freq: Once | INTRAVENOUS | Status: AC
  Administered 2022-03-14: 2 g via INTRAVENOUS
  Filled 2022-03-14: qty 50

## 2022-03-14 MED ORDER — SODIUM CHLORIDE 0.9 % IV SOLN: INTRAVENOUS | Status: DC

## 2022-03-14 MED ORDER — IPRATROPIUM BROMIDE 0.02 % IN SOLN: 0.5000 mg | Freq: Two times a day (BID) | RESPIRATORY_TRACT | Status: DC

## 2022-03-14 MED ORDER — IPRATROPIUM BROMIDE 0.02 % IN SOLN
0.5000 mg | Freq: Two times a day (BID) | RESPIRATORY_TRACT | Status: DC
  Administered 2022-03-15 – 2022-03-16 (×3): 0.5 mg via RESPIRATORY_TRACT
  Filled 2022-03-14 (×3): qty 2.5

## 2022-03-14 MED ORDER — POTASSIUM CHLORIDE CRYS ER 20 MEQ PO TBCR
40.0000 meq | EXTENDED_RELEASE_TABLET | Freq: Two times a day (BID) | ORAL | Status: AC
  Administered 2022-03-14 (×2): 40 meq via ORAL
  Filled 2022-03-14 (×2): qty 2

## 2022-03-14 NOTE — Progress Notes (Signed)
PROGRESS NOTE    Penny Miller  GYJ:[id number] DOB: [birth year] DOA: 03/12/2022 PCP: [name], MD   Brief Narrative:  HPI per Dr. Neva Seat Penny Miller is a 88 y.o. female with medical history significant of spinal stenosis, sinus node dysfunction, diabetes, neuropathy, atrial fibrillation, hypertension, overactive bladder, anemia, asthma, hypothyroidism, CVA, CKD 4 presenting with altered mental status.   History obtained with assistance of patient's granddaughter and chart review due to patient's encephalopathy.  She was last noted to be normal yesterday morning.  Today she was noted to be confused and oriented x 1 only when at baseline she is oriented x 3 and conversant and able to perform ADLs.  She has a history of chronic cystitis and family was noting frequent trips to the bathroom without being able to go.   Patient unable to participate in full review of systems due to altered mentation.   ED Course: Vital signs in the ED significant for blood pressure in the 263Z to 858I systolic.  Heart rate in the 50s to 60s.  Lab workup included CMP with creatinine stable at 1.44, glucose 100, protein 5.9, albumin 3.4.  CBC with hemoglobin stable at 10.4, platelets 140.  TSH normal.  Ethanol level negative.  Aspirin and Tylenol levels negative.  Urinalysis normal.  Urine culture pending.  Respiratory panel for flu and COVID positive.  Magnesium normal.  CT head without acute abnormality.  Chest x-ray with stable left greater than right pleural effusion and left basilar airspace disease.  Patient started on molnupiravir, Solu-Medrol, DuoNebs in the ED.    **Interim History Is slowly improving and her mentation is better.  Continues to feel a little dyspneic and felt dizzy upon standing.  Will check orthostatics in the morning and give her gentle IV fluid hydration today.  Labs relatively stable but D-dimer slightly elevated.  If continues to be elevated tomorrow may pursue CTA of the  chest and lower extremity venous duplex.  OT recommending home health but PT does still evaluate and treat   Assessment and Plan:  Acute encephalopathy COVID-19 infection Mild asthma exacerbation > Patient presenting with altered mental status for the past day or less.  Family had some concern for possible recurrent UTI given her history and frequent trips to the bathroom. > In the ED workup showed normal urinalysis.  No leukocytosis.  Chest x-ray stable but did show chronic pleural effusion and basilar airspace disease.  CT head without acute abnormality either.  No significant electrolyte derangements. > Respiratory screening did come back positive for COVID-19 infection which is presumed etiology of encephalopathy.  She was also noted to be wheezing in the ED and received steroids and breathing treatment.  Also started on molnupiravir. -Continue to monitor on telemetry - Continue daily steroids and is now on prednisone 50 mg p.o. daily -COVID-19 Labs  Recent Labs    03/13/22 0449 03/14/22 0302  DDIMER  --  1.16*  FERRITIN 20 22  LDH  --  202*  CRP <0.5 0.6    Lab Results  Component Value Date   SARSCOV2NAA POSITIVE (A) 03/12/2022   SARSCOV2NAA NEGATIVE 07/16/2021   Martin NEGATIVE 11/20/2020    -SpO2: 94 % O2 Flow Rate (L/min): 1 L/min - Scheduled Atrovent - As needed albuterol -Leukopenia is likely from COVID infection - Continue molnupiravir for 5 days total -She will need an amatory home O2 screen prior to discharge - Trend daily labs and inflammatory markers -Need PT and OT to further  evaluate and treat; OT recommending home health but will obtain PT evaluation   Hypertension - Continue home amlodipine - Hold home Lasix given her dizziness upon standing we will give her gentle IV fluid hydration -Continue monitor blood pressures per protocol   Thrombocytopenia  -The patient's platelet count has gone from 140s now 133 and slightly further dropped to 120    History of CVA - Continue home atorvastatin - Continue home Eliquis  Dizziness with standing -He got dizzy and almost vomited while standing for quite a while.  Will give gentle IV fluid hydration currently and check orthostatics in a.m.-check TSH in the morning -PT evaluation still pending but OT recommending home health  Elevated D-dimer -Mild and likely related to COVID -If he continues to be elevated may obtain a CTA of the chest to evaluate for PE   Atrial fibrillation - Continue home apixaban - Not on rate control or rhythm control medication currently   Neuropathy - Continue home gabapentin   Diabetes - SSI and his CBGs likely will be elevated in the setting of steroid demargination -CBGs ranging from 94-258   Normocytic/Macrocytic Anemia > Hemoglobin stable in the ED; now hemoglobin/hematocrit is now 10.2/32.1 with an MCV of 102.2 -Check anemia panel in the a.m. - Continue to trend CBC - Continue with home zinc and thiamine  Hypoalbuminemia -Patient's albumin level is now 2.6 -Continue monitor and trend and repeat CMP in a.m.   CKD 3B > Creatinine stable at 1.44 in the ED. is now showing BUNs/creatinine of 17/1.27 yesterday and today is now 26/1.33 -Avoid further nephrotoxic medications, contrast dyes, hypotension and dehydration ensure adequate renal perfusion -Will give gentle IV for hydration at 75 MLS per hour To continue monitor and trend renal function carefully and repeat CMP in the a.m.   DVT prophylaxis: apixaban (ELIQUIS) tablet 2.5 mg Start: 03/12/22 2200 apixaban (ELIQUIS) tablet 2.5 mg    Code Status: DNR Family Communication: Discussed with granddaughter [name] at bedside  Disposition Plan:  Level of care: Telemetry Medical Status is: Inpatient Remains inpatient appropriate because: Needs PT to evaluate for safe discharge disposition and will need repeat orthostatics in the morning as well as ambulatory home O2 screen   Consultants:   None  Procedures:  As above  Antimicrobials:  Anti-infectives (From admission, onward)    Start     Dose/Rate Route Frequency Ordered Stop   03/12/22 1900  molnupiravir EUA (LAGEVRIO) capsule 800 mg        4 capsule Oral 2 times daily 03/12/22 1800 03/17/22 2159       Subjective: Seen and examined at bedside and patient was doing better mentally when she is much more awake and alert.  Still requiring some oxygen but will start weaning.  Got dizzy upon standing.  Denies any chest pain.  Has a cough but is not coughing up any sputum.  No other concerns or complaints at this time.  Objective: Vitals:   03/14/22 1342 03/14/22 1500 03/14/22 1600 03/14/22 1725  BP:  (!) 144/65 (!) 142/76 (!) 174/74  Pulse:  (!) 47 61 (!) 52  Resp:  '18 15 16  '$ Temp: 97.7 F (36.5 C)   97.8 F (36.6 C)  TempSrc: Oral   Oral  SpO2:  95% 94%   Weight:      Height:        Intake/Output Summary (Last 24 hours) at 03/14/2022 1812 Last data filed at 03/14/2022 1048 Gross per 24 hour  Intake --  Output  1000 ml  Net -1000 ml   Filed Weights   03/12/22 2134  Weight: 69.9 kg   Examination: Physical Exam:  Constitutional: WN/WD elderly Caucasian female currently no acute distress Respiratory: Diminished to auscultation bilaterally with coarse breath sounds, no wheezing, rales, rhonchi or crackles. Normal respiratory effort and patient is not tachypenic. No accessory muscle use.  Wearing supplemental oxygen via nasal cannula Cardiovascular: RRR, no murmurs / rubs / gallops. S1 and S2 auscultated.  Trace extremity edema Abdomen: Soft, non-tender, non-distended. Bowel sounds positive.  GU: Deferred. Musculoskeletal: No clubbing / cyanosis of digits/nails. No joint deformity upper and lower extremities.  Skin: No rashes, lesions, ulcers on limited skin evaluation. No induration; Warm and dry.  Neurologic: CN 2-12 grossly intact with no focal deficits. Romberg sign and cerebellar reflexes not assessed.   Psychiatric: Normal judgment and insight. Alert and oriented x 3. Normal mood and appropriate affect.   Data Reviewed: I have personally reviewed following labs and imaging studies  CBC: Recent Labs  Lab 03/12/22 1325 03/13/22 0449 03/14/22 0302  WBC 4.0 3.8* 6.1  NEUTROABS 2.6 3.1 4.3  HGB 10.4* 11.7* 10.2*  HCT 32.0* 35.0* 32.1*  MCV 100.3* 99.4 102.2*  PLT 140* 133* 932*   Basic Metabolic Panel: Recent Labs  Lab 03/12/22 1325 03/13/22 0449 03/14/22 0302  NA 137 136 139  K 3.8 3.7 3.2*  CL 102 100 105  CO2 '27 25 28  '$ GLUCOSE 100* 160* 107*  BUN 17 17 26*  CREATININE 1.44* 1.27* 1.33*  CALCIUM 9.8 9.7 8.2*  MG 1.9  --  1.8  PHOS  --   --  4.0   GFR: Estimated Creatinine Clearance: 27.4 mL/min (A) (by C-G formula based on SCr of 1.33 mg/dL (H)). Liver Function Tests: Recent Labs  Lab 03/12/22 1325 03/13/22 0449 03/14/22 0302  AST '20 26 21  '$ ALT '11 16 10  '$ ALKPHOS 63 66 51  BILITOT 0.3 0.4 0.3  PROT 5.9* 6.0* 4.9*  ALBUMIN 3.4* 3.4* 2.6*   No results for input(s): "LIPASE", "AMYLASE" in the last 168 hours. No results for input(s): "AMMONIA" in the last 168 hours. Coagulation Profile: No results for input(s): "INR", "PROTIME" in the last 168 hours. Cardiac Enzymes: No results for input(s): "CKTOTAL", "CKMB", "CKMBINDEX", "TROPONINI" in the last 168 hours. BNP (last 3 results) Recent Labs    02/20/22 1150 03/04/22 0958  PROBNP 1,012.0* 863.0*   HbA1C: No results for input(s): "HGBA1C" in the last 72 hours. CBG: Recent Labs  Lab 03/13/22 1441 03/13/22 1706 03/13/22 2321 03/14/22 0756 03/14/22 1312  GLUCAP 258* 219* 104* 94 135*   Lipid Profile: No results for input(s): "CHOL", "HDL", "LDLCALC", "TRIG", "CHOLHDL", "LDLDIRECT" in the last 72 hours. Thyroid Function Tests: Recent Labs    03/12/22 1640  TSH 4.247   Anemia Panel: Recent Labs    03/13/22 0449 03/14/22 0302  FERRITIN 20 22   Sepsis Labs: No results for input(s):  "PROCALCITON", "LATICACIDVEN" in the last 168 hours.  Recent Results (from the past 240 hour(s))  Resp panel by RT-PCR (RSV, Flu A&B, Covid) Anterior Nasal Swab     Status: Abnormal   Collection Time: 03/12/22  2:47 PM   Specimen: Anterior Nasal Swab  Result Value Ref Range Status   SARS Coronavirus 2 by RT PCR POSITIVE (A) NEGATIVE Final    Comment: (NOTE) SARS-CoV-2 target nucleic acids are DETECTED.  The SARS-CoV-2 RNA is generally detectable in upper respiratory specimens during the acute phase of infection. Positive results  are indicative of the presence of the identified virus, but do not rule out bacterial infection or co-infection with other pathogens not detected by the test. Clinical correlation with patient history and other diagnostic information is necessary to determine patient infection status. The expected result is Negative.  Fact Sheet for Patients: EntrepreneurPulse.com.au  Fact Sheet for Healthcare Providers: IncredibleEmployment.be  This test is not yet approved or cleared by the [place] FDA and  has been authorized for detection and/or diagnosis of SARS-CoV-2 by FDA under an Emergency Use Authorization (EUA).  This EUA will remain in effect (meaning this test can be used) for the duration of  the COVID-19 declaration under Section 564(b)(1) of the A ct, 21 U.S.C. section 360bbb-3(b)(1), unless the authorization is terminated or revoked sooner.     Influenza A by PCR NEGATIVE NEGATIVE Final   Influenza B by PCR NEGATIVE NEGATIVE Final    Comment: (NOTE) The Xpert Xpress SARS-CoV-2/FLU/RSV plus assay is intended as an aid in the diagnosis of influenza from Nasopharyngeal swab specimens and should not be used as a sole basis for treatment. Nasal washings and aspirates are unacceptable for Xpert Xpress SARS-CoV-2/FLU/RSV testing.  Fact Sheet for Patients: EntrepreneurPulse.com.au  Fact Sheet for  Healthcare Providers: IncredibleEmployment.be  This test is not yet approved or cleared by the [place] FDA and has been authorized for detection and/or diagnosis of SARS-CoV-2 by FDA under an Emergency Use Authorization (EUA). This EUA will remain in effect (meaning this test can be used) for the duration of the COVID-19 declaration under Section 564(b)(1) of the Act, 21 U.S.C. section 360bbb-3(b)(1), unless the authorization is terminated or revoked.     Resp Syncytial Virus by PCR NEGATIVE NEGATIVE Final    Comment: (NOTE) Fact Sheet for Patients: EntrepreneurPulse.com.au  Fact Sheet for Healthcare Providers: IncredibleEmployment.be  This test is not yet approved or cleared by the [place] FDA and has been authorized for detection and/or diagnosis of SARS-CoV-2 by FDA under an Emergency Use Authorization (EUA). This EUA will remain in effect (meaning this test can be used) for the duration of the COVID-19 declaration under Section 564(b)(1) of the Act, 21 U.S.C. section 360bbb-3(b)(1), unless the authorization is terminated or revoked.  Performed at [facility], [place] [address], [place], [place] [address]   Urine Culture     Status: Abnormal (Preliminary result)   Collection Time: 03/12/22  4:48 PM   Specimen: In/Out Cath Urine  Result Value Ref Range Status   Specimen Description IN/OUT CATH URINE  Final   Special Requests NONE  Final   Culture (A)  Final    >=100,000 COLONIES/mL DIPHTHEROIDS(CORYNEBACTERIUM SPECIES) Standardized susceptibility testing for this organism is not available. 20,000 COLONIES/mL ENTEROCOCCUS FAECALIS SUSCEPTIBILITIES TO FOLLOW Performed at [facility], [place] [address], [place], [place] [address]    Report Status PENDING  Incomplete    Radiology Studies: DG CHEST PORT 1 VIEW  Result Date: 03/14/2022 CLINICAL DATA:  COVID EXAM: PORTABLE CHEST 1 VIEW COMPARISON:   Two days ago FINDINGS: Stable heart size and mediastinal contours. Implantable cardiac device. Artifact from support hardware. Hazy density at the left base from pleural fluid and atelectasis by recent CT. No pneumothorax. IMPRESSION: Stable compared to 2 days prior. Mild left opacity from pleural fluid and atelectasis by recent chest CT. Electronically Signed   By: Jorje Guild M.D.   On: 03/14/2022 07:15    Scheduled Meds:  amLODipine  2.5 mg Oral QHS   apixaban  2.5  mg Oral BID   atorvastatin  40 mg Oral QHS   furosemide  40 mg Oral Daily   gabapentin  300 mg Oral TID   insulin aspart  0-9 Units Subcutaneous TID WC   ipratropium  0.5 mg Nebulization BID   molnupiravir EUA  4 capsule Oral BID   mometasone-formoterol  2 puff Inhalation BID   potassium chloride  40 mEq Oral BID   predniSONE  50 mg Oral Q breakfast   sodium chloride flush  3 mL Intravenous Q12H   thiamine  50 mg Oral Daily   zinc sulfate  220 mg Oral Daily   Continuous Infusions:  sodium chloride      LOS: 1 day   Raiford Noble, DO [facility] Available via Epic secure chat 7am-7pm After these hours, please refer to coverage provider listed on [email or web address] 03/14/2022, 6:12 PM

## 2022-03-14 NOTE — ED Notes (Signed)
ED TO INPATIENT HANDOFF REPORT  ED Nurse Name and Phone #:Ezequias Lard [phone]  S Name/Age/Gender Penny Miller 88 y.o. female Room/Bed: 036C/036C  Code Status   Code Status: DNR  Home/SNF/Other Home Patient oriented to: self, place, time, and situation Is this baseline? Yes   Triage Complete: Triage complete  Chief Complaint Acute encephalopathy [G93.40]  Triage Note Patient was bib [facility] from home with complaints of altered mental status. The family states that around 0800 this morning she began to become confused. Currently alert and oriented x3. She has a history of chronic UTI family did state she is going to restroom frequently and not being able to use it. VSS   Allergies Allergies  Allergen Reactions   Jardiance [Empagliflozin] Diarrhea   Metformin And Related Diarrhea   Codeine Nausea And Vomiting   Metoprolol Nausea And Vomiting   Relafen [Nabumetone] Nausea And Vomiting   Septra [Sulfamethoxazole-Trimethoprim] Nausea And Vomiting   Tetracycline Nausea And Vomiting   Adhesive [Tape] Rash and Other (See Comments)    Tears the skin also   Ceclor [Cefaclor] Other (See Comments)    Unknown reaction; not recalled   Cefaclor Other (See Comments)    Unknown reaction   Codeine Nausea And Vomiting   Jardiance [Empagliflozin] Diarrhea   Lopid [Gemfibrozil] Other (See Comments)    Unknown reaction; not recalled   Lopid [Gemfibrozil] Other (See Comments)    Unknown reaction   Lopressor [Metoprolol Tartrate] Nausea And Vomiting   Metformin And Related Diarrhea   Morphine And Related Other (See Comments)    Passed out   Morphine And Related Other (See Comments)    Passed out   Niacin Nausea And Vomiting    Unknown reaction Unknown reaction; not recalled   Niacin And Related Other (See Comments)    Unknown reaction; not recalled   Niacin And Related Other (See Comments)    Unknown reaction   Relafen [Nabumetone] Nausea And Vomiting   Septra  [Sulfamethoxazole-Trimethoprim] Nausea And Vomiting   Tape Rash and Other (See Comments)    Tears skin   Tetracyclines & Related Nausea And Vomiting   Toprol Xl [Metoprolol Tartrate] Nausea And Vomiting   Toprol Xl [Metoprolol] Nausea And Vomiting    Level of Care/Admitting Diagnosis ED Disposition     ED Disposition  Admit   Condition  --   Comment  Hospital Area: [facility] [[id number]]  Level of Care: Telemetry Medical [104]  May admit patient to [facility] or [facility] if equivalent level of care is available:: No  Covid Evaluation: Asymptomatic - no recent exposure (last 10 days) testing not required  Diagnosis: Acute encephalopathy [314970]  Admitting Physician: [place], [place] [[id number]]  Attending Physician: [place], [place] [[id number]]  Certification:: I certify this patient will need inpatient services for at least 2 midnights  Estimated Length of Stay: 2          B Medical/Surgery History Past Medical History:  Diagnosis Date   Allergic rhinitis, cause unspecified    Anxiety state, unspecified    Arthritis    Colon polyp    Diaphragmatic hernia without mention of obstruction or gangrene    Diverticulosis of colon (without mention of hemorrhage)    Female stress incontinence    HTN (hypertension)    Insomnia, unspecified    Interstitial cystitis    surgery 02/01/2013   Irritable bowel syndrome    Memory loss    Other and unspecified hyperlipidemia    Other premature  beats    Polyneuropathy in diabetes(357.2)    Postmenopausal bleeding    Proteinuria    Rectal ulcer    Skin cancer    Stroke ([place]) 2018   Type II or unspecified type diabetes mellitus with neurological manifestations, not stated as uncontrolled(250.60)    Unspecified essential hypertension    Viral warts, unspecified    Past Surgical History:  Procedure Laterality Date   ABDOMINAL HYSTERECTOMY  1977   [place]   CATARACT EXTRACTION,  BILATERAL  02/10/2006   Dr.Groat    COLONOSCOPY  07/17/2004   polypectomy   CYSTOSCOPY  1987   Dr.Bradley   LOOP RECORDER INSERTION N/A 07/31/2016   Procedure: Loop Recorder Insertion;  Surgeon: [name], MD;  Location: [facility];  Service: Cardiovascular;  Laterality: N/A;   TEE WITHOUT CARDIOVERSION N/A 07/30/2016   Procedure: TRANSESOPHAGEAL ECHOCARDIOGRAM (TEE);  Surgeon: [name], MD;  Location: [facility];  Service: Cardiovascular;  Laterality: N/A;   [place]  05/13/16   urinary bladder      surgery 02/01/2013 to stretch bladder stem     A IV Location/Drains/Wounds Patient Lines/Drains/Airways Status     Active Line/Drains/Airways     Name Placement date Placement time Site Days   Peripheral IV 03/12/22 Left Antecubital 03/12/22  1900  Antecubital  2            Intake/Output Last 24 hours  Intake/Output Summary (Last 24 hours) at 03/14/2022 1437 Last data filed at 03/14/2022 1048 Gross per 24 hour  Intake --  Output 1000 ml  Net -1000 ml    Labs/Imaging Results for orders placed or performed during the hospital encounter of 03/12/22 (from the past 48 hour(s))  Resp panel by RT-PCR (RSV, Flu A&B, Covid) Anterior Nasal Swab     Status: Abnormal   Collection Time: 03/12/22  2:47 PM   Specimen: Anterior Nasal Swab  Result Value Ref Range   SARS Coronavirus 2 by RT PCR POSITIVE (A) NEGATIVE    Comment: (NOTE) SARS-CoV-2 target nucleic acids are DETECTED.  The SARS-CoV-2 RNA is generally detectable in upper respiratory specimens during the acute phase of infection. Positive results are indicative of the presence of the identified virus, but do not rule out bacterial infection or co-infection with other pathogens not detected by the test. Clinical correlation with patient history and other diagnostic information is necessary to determine patient infection status. The expected result is  Negative.  Fact Sheet for Patients: EntrepreneurPulse.com.au  Fact Sheet for Healthcare Providers: IncredibleEmployment.be  This test is not yet approved or cleared by the [place] FDA and  has been authorized for detection and/or diagnosis of SARS-CoV-2 by FDA under an Emergency Use Authorization (EUA).  This EUA will remain in effect (meaning this test can be used) for the duration of  the COVID-19 declaration under Section 564(b)(1) of the A ct, 21 U.S.C. section 360bbb-3(b)(1), unless the authorization is terminated or revoked sooner.     Influenza A by PCR NEGATIVE NEGATIVE   Influenza B by PCR NEGATIVE NEGATIVE    Comment: (NOTE) The Xpert Xpress SARS-CoV-2/FLU/RSV plus assay is intended as an aid in the diagnosis of influenza from Nasopharyngeal swab specimens and should not be used as a sole basis for treatment. Nasal washings and aspirates are unacceptable for Xpert Xpress SARS-CoV-2/FLU/RSV testing.  Fact Sheet for Patients: EntrepreneurPulse.com.au  Fact Sheet for Healthcare Providers:  IncredibleEmployment.be  This test is not yet approved or cleared by the [place] and has been authorized for detection and/or diagnosis of SARS-CoV-2 by FDA under an Emergency Use Authorization (EUA). This EUA will remain in effect (meaning this test can be used) for the duration of the COVID-19 declaration under Section 564(b)(1) of the Act, 21 U.S.C. section 360bbb-3(b)(1), unless the authorization is terminated or revoked.     Resp Syncytial Virus by PCR NEGATIVE NEGATIVE    Comment: (NOTE) Fact Sheet for Patients: EntrepreneurPulse.com.au  Fact Sheet for Healthcare Providers: IncredibleEmployment.be  This test is not yet approved or cleared by the [place] FDA and has been authorized for detection and/or diagnosis of SARS-CoV-2 by FDA under an  Emergency Use Authorization (EUA). This EUA will remain in effect (meaning this test can be used) for the duration of the COVID-19 declaration under Section 564(b)(1) of the Act, 21 U.S.C. section 360bbb-3(b)(1), unless the authorization is terminated or revoked.  Performed at [facility], [place] [address], [place], [place] [address]   TSH     Status: None   Collection Time: 03/12/22  4:40 PM  Result Value Ref Range   TSH 4.247 0.350 - 4.500 uIU/mL    Comment: Performed by a 3rd Generation assay with a functional sensitivity of <=0.01 uIU/mL. Performed at [facility], [place] [address], [place], [place] [address]   Ethanol     Status: None   Collection Time: 03/12/22  4:40 PM  Result Value Ref Range   Alcohol, Ethyl (B) <10 <10 mg/dL    Comment: (NOTE) Lowest detectable limit for serum alcohol is 10 mg/dL.  For medical purposes only. Performed at [facility], [place] [address] [place], [place] [address]   Salicylate level     Status: Abnormal   Collection Time: 03/12/22  4:40 PM  Result Value Ref Range   Salicylate Lvl <9.3 (L) 7.0 - 30.0 mg/dL    Comment: Performed at [facility] [address] [place], [place] [address]  Acetaminophen level     Status: Abnormal   Collection Time: 03/12/22  4:40 PM  Result Value Ref Range   Acetaminophen (Tylenol), Serum <10 (L) 10 - 30 ug/mL    Comment: (NOTE) Therapeutic concentrations vary significantly. A range of 10-30 ug/mL  may be an effective concentration for many patients. However, some  are best treated at concentrations outside of this range. Acetaminophen concentrations >150 ug/mL at 4 hours after ingestion  and >50 ug/mL at 12 hours after ingestion are often associated with  toxic reactions.  Performed at [facility], [place] [address] [place], [place] [address]   Urinalysis, Routine w reflex microscopic Urine, In & Out Cath     Status: Abnormal   Collection Time: 03/12/22  4:47 PM  Result  Value Ref Range   Color, Urine YELLOW YELLOW   APPearance HAZY (A) CLEAR   Specific Gravity, Urine 1.009 1.005 - 1.030   pH 8.0 5.0 - 8.0   Glucose, UA NEGATIVE NEGATIVE mg/dL   Hgb urine dipstick NEGATIVE NEGATIVE   Bilirubin Urine NEGATIVE NEGATIVE   Ketones, ur NEGATIVE NEGATIVE mg/dL   Protein, ur 30 (A) NEGATIVE mg/dL   Nitrite NEGATIVE NEGATIVE   Leukocytes,Ua NEGATIVE NEGATIVE   RBC / HPF 0-5 0 - 5 RBC/hpf   WBC, UA 0-5 0 - 5 WBC/hpf   Bacteria, UA NONE SEEN NONE SEEN   Mucus PRESENT     Comment:  Performed at [facility], [place] [address], [place], [place] [address]  Urine Culture     Status: Abnormal (Preliminary result)   Collection Time: 03/12/22  4:48 PM   Specimen: In/Out Cath Urine  Result Value Ref Range   Specimen Description IN/OUT CATH URINE    Special Requests NONE    Culture (A)     >=100,000 COLONIES/mL DIPHTHEROIDS(CORYNEBACTERIUM SPECIES) Standardized susceptibility testing for this organism is not available. 20,000 COLONIES/mL ENTEROCOCCUS FAECALIS SUSCEPTIBILITIES TO FOLLOW Performed at [facility], [place] [address] [place], [place] [address]    Report Status PENDING   Comprehensive metabolic panel     Status: Abnormal   Collection Time: 03/13/22  4:49 AM  Result Value Ref Range   Sodium 136 135 - 145 mmol/L   Potassium 3.7 3.5 - 5.1 mmol/L   Chloride 100 98 - 111 mmol/L   CO2 25 22 - 32 mmol/L   Glucose, Bld 160 (H) 70 - 99 mg/dL    Comment: Glucose reference range applies only to samples taken after fasting for at least 8 hours.   BUN 17 8 - 23 mg/dL   Creatinine, Ser 1.27 (H) 0.44 - 1.00 mg/dL   Calcium 9.7 8.9 - 10.3 mg/dL   Total Protein 6.0 (L) 6.5 - 8.1 g/dL   Albumin 3.4 (L) 3.5 - 5.0 g/dL   AST 26 15 - 41 U/L   ALT 16 0 - 44 U/L   Alkaline Phosphatase 66 38 - 126 U/L   Total Bilirubin 0.4 0.3 - 1.2 mg/dL   GFR, Estimated 41 (L) >60 mL/min    Comment: (NOTE) Calculated using the CKD-EPI Creatinine Equation (2021)    Anion  gap 11 5 - 15    Comment: Performed at [facility], [place] [address], [place], [place] [address]  CBC with Differential/Platelet     Status: Abnormal   Collection Time: 03/13/22  4:49 AM  Result Value Ref Range   WBC 3.8 (L) 4.0 - 10.5 K/uL   RBC 3.52 (L) 3.87 - 5.11 MIL/uL   Hemoglobin 11.7 (L) 12.0 - 15.0 g/dL   HCT 35.0 (L) 36.0 - 46.0 %   MCV 99.4 80.0 - 100.0 fL   MCH 33.2 26.0 - 34.0 pg   MCHC 33.4 30.0 - 36.0 g/dL   RDW 12.9 11.5 - 15.5 %   Platelets 133 (L) 150 - 400 K/uL    Comment: REPEATED TO VERIFY   nRBC 0.0 0.0 - 0.2 %   Neutrophils Relative % 82 %   Neutro Abs 3.1 1.7 - 7.7 K/uL   Lymphocytes Relative 14 %   Lymphs Abs 0.5 (L) 0.7 - 4.0 K/uL   Monocytes Relative 3 %   Monocytes Absolute 0.1 0.1 - 1.0 K/uL   Eosinophils Relative 0 %   Eosinophils Absolute 0.0 0.0 - 0.5 K/uL   Basophils Relative 0 %   Basophils Absolute 0.0 0.0 - 0.1 K/uL   WBC Morphology MILD LEFT SHIFT (1-5% METAS, OCC MYELO, OCC BANDS)    RBC Morphology See Note    Immature Granulocytes 1 %   Abs Immature Granulocytes 0.02 0.00 - 0.07 K/uL   Polychromasia PRESENT     Comment: Performed at [facility], [place] [address] [place], [place] [address]  C-reactive protein     Status: None   Collection Time: 03/13/22  4:49 AM  Result Value Ref Range   CRP <0.5 <1.0 mg/dL    Comment: Performed at  [facility], [place] [address], [place], [place] [address]  Ferritin     Status: None   Collection Time: 03/13/22  4:49 AM  Result Value Ref Range   Ferritin 20 11 - 307 ng/mL    Comment: Performed at [facility], [place] [address], [place], [place] [address]  CBG monitoring, ED     Status: Abnormal   Collection Time: 03/13/22  7:36 AM  Result Value Ref Range   Glucose-Capillary 152 (H) 70 - 99 mg/dL    Comment: Glucose reference range applies only to samples taken after fasting for at least 8 hours.  CBG monitoring, ED     Status: Abnormal   Collection Time: 03/13/22 12:19 PM  Result  Value Ref Range   Glucose-Capillary 254 (H) 70 - 99 mg/dL    Comment: Glucose reference range applies only to samples taken after fasting for at least 8 hours.  CBG monitoring, ED     Status: Abnormal   Collection Time: 03/13/22  2:41 PM  Result Value Ref Range   Glucose-Capillary 258 (H) 70 - 99 mg/dL    Comment: Glucose reference range applies only to samples taken after fasting for at least 8 hours.  CBG monitoring, ED     Status: Abnormal   Collection Time: 03/13/22  5:06 PM  Result Value Ref Range   Glucose-Capillary 219 (H) 70 - 99 mg/dL    Comment: Glucose reference range applies only to samples taken after fasting for at least 8 hours.  CBG monitoring, ED     Status: Abnormal   Collection Time: 03/13/22 11:21 PM  Result Value Ref Range   Glucose-Capillary 104 (H) 70 - 99 mg/dL    Comment: Glucose reference range applies only to samples taken after fasting for at least 8 hours.  Comprehensive metabolic panel     Status: Abnormal   Collection Time: 03/14/22  3:02 AM  Result Value Ref Range   Sodium 139 135 - 145 mmol/L   Potassium 3.2 (L) 3.5 - 5.1 mmol/L   Chloride 105 98 - 111 mmol/L   CO2 28 22 - 32 mmol/L   Glucose, Bld 107 (H) 70 - 99 mg/dL    Comment: Glucose reference range applies only to samples taken after fasting for at least 8 hours.   BUN 26 (H) 8 - 23 mg/dL   Creatinine, Ser 1.33 (H) 0.44 - 1.00 mg/dL   Calcium 8.2 (L) 8.9 - 10.3 mg/dL   Total Protein 4.9 (L) 6.5 - 8.1 g/dL   Albumin 2.6 (L) 3.5 - 5.0 g/dL   AST 21 15 - 41 U/L   ALT 10 0 - 44 U/L   Alkaline Phosphatase 51 38 - 126 U/L   Total Bilirubin 0.3 0.3 - 1.2 mg/dL   GFR, Estimated 38 (L) >60 mL/min    Comment: (NOTE) Calculated using the CKD-EPI Creatinine Equation (2021)    Anion gap 6 5 - 15    Comment: Performed at [facility], [place] [address] [place],  [address]  CBC with Differential/Platelet     Status: Abnormal   Collection Time: 03/14/22  3:02 AM  Result Value Ref Range    WBC 6.1 4.0 - 10.5 K/uL   RBC 3.14 (L) 3.87 - 5.11 MIL/uL   Hemoglobin 10.2 (L) 12.0 - 15.0 g/dL   HCT 32.1 (L) 36.0 - 46.0 %   MCV 102.2 (H) 80.0 - 100.0 fL   MCH 32.5 26.0 - 34.0 pg  MCHC 31.8 30.0 - 36.0 g/dL   RDW 12.7 11.5 - 15.5 %   Platelets 120 (L) 150 - 400 K/uL   nRBC 0.0 0.0 - 0.2 %   Neutrophils Relative % 72 %   Neutro Abs 4.3 1.7 - 7.7 K/uL   Lymphocytes Relative 13 %   Lymphs Abs 0.8 0.7 - 4.0 K/uL   Monocytes Relative 15 %   Monocytes Absolute 0.9 0.1 - 1.0 K/uL   Eosinophils Relative 0 %   Eosinophils Absolute 0.0 0.0 - 0.5 K/uL   Basophils Relative 0 %   Basophils Absolute 0.0 0.0 - 0.1 K/uL   Immature Granulocytes 0 %   Abs Immature Granulocytes 0.02 0.00 - 0.07 K/uL    Comment: Performed at [facility] [address] [place], [place] [address]  C-reactive protein     Status: None   Collection Time: 03/14/22  3:02 AM  Result Value Ref Range   CRP 0.6 <1.0 mg/dL    Comment: Performed at [facility] [address], [place], [place] [address]  Ferritin     Status: None   Collection Time: 03/14/22  3:02 AM  Result Value Ref Range   Ferritin 22 11 - 307 ng/mL    Comment: Performed at [facility], [place] [address], [place], [place] [address]  Magnesium     Status: None   Collection Time: 03/14/22  3:02 AM  Result Value Ref Range   Magnesium 1.8 1.7 - 2.4 mg/dL    Comment: Performed at [facility] [address], [place], [place] [address]  Phosphorus     Status: None   Collection Time: 03/14/22  3:02 AM  Result Value Ref Range   Phosphorus 4.0 2.5 - 4.6 mg/dL    Comment: Performed at [facility] [address] [place], [place] [address]  Sedimentation rate     Status: None   Collection Time: 03/14/22  3:02 AM  Result Value Ref Range   Sed Rate 6 0 - 22 mm/hr    Comment: Performed at [facility] [address], [place], [place] [address]  D-dimer, quantitative     Status: Abnormal   Collection Time: 03/14/22   3:02 AM  Result Value Ref Range   D-Dimer, Quant 1.16 (H) 0.00 - 0.50 ug/mL-FEU    Comment: (NOTE) At the manufacturer cut-off value of 0.5 g/mL FEU, this assay has a negative predictive value of 95-100%.This assay is intended for use in conjunction with a clinical pretest probability (PTP) assessment model to exclude pulmonary embolism (PE) and deep venous thrombosis (DVT) in outpatients suspected of PE or DVT. Results should be correlated with clinical presentation. Performed at [facility], [place] [address], [place], [place] [address]   Fibrinogen     Status: None   Collection Time: 03/14/22  3:02 AM  Result Value Ref Range   Fibrinogen 260 210 - 475 mg/dL    Comment: (NOTE) Fibrinogen results may be underestimated in patients receiving thrombolytic therapy. Performed at [facility], [place] [address] [place], [place] [address]   Lactate dehydrogenase     Status: Abnormal   Collection Time: 03/14/22  3:02 AM  Result Value Ref Range   LDH 202 (H) 98 - 192 U/L    Comment: Performed at [facility], [place] [address] [place], [place] [address]  CBG monitoring, ED     Status: None   Collection Time:  03/14/22  7:56 AM  Result Value Ref Range   Glucose-Capillary 94 70 - 99 mg/dL    Comment: Glucose reference range applies only to samples taken after fasting for at least 8 hours.  CBG monitoring, ED     Status: Abnormal   Collection Time: 03/14/22  1:12 PM  Result Value Ref Range   Glucose-Capillary 135 (H) 70 - 99 mg/dL    Comment: Glucose reference range applies only to samples taken after fasting for at least 8 hours.   DG CHEST PORT 1 VIEW  Result Date: 03/14/2022 CLINICAL DATA:  COVID EXAM: PORTABLE CHEST 1 VIEW COMPARISON:  Two days ago FINDINGS: Stable heart size and mediastinal contours. Implantable cardiac device. Artifact from support hardware. Hazy density at the left base from pleural fluid and atelectasis by recent CT. No pneumothorax. IMPRESSION:  Stable compared to 2 days prior. Mild left opacity from pleural fluid and atelectasis by recent chest CT. Electronically Signed   By: Jorje Guild M.D.   On: 03/14/2022 07:15   CT Head Wo Contrast  Result Date: 03/12/2022 CLINICAL DATA:  Mental status change, unknown cause. EXAM: CT HEAD WITHOUT CONTRAST TECHNIQUE: Contiguous axial images were obtained from the base of the skull through the vertex without intravenous contrast. RADIATION DOSE REDUCTION: This exam was performed according to the departmental dose-optimization program which includes automated exposure control, adjustment of the mA and/or kV according to patient size and/or use of iterative reconstruction technique. COMPARISON:  CT head dated July 16, 2021 FINDINGS: Brain: No evidence of acute infarction, hemorrhage, hydrocephalus, extra-axial collection or mass lesion/mass effect. Small infarct of the left occipital lobe is unchanged. Advanced chronic microvascular ischemic changes of the periventricular and subcortical white matter. Vascular: No hyperdense vessel or unexpected calcification. Skull: Normal. Negative for fracture or focal lesion. Sinuses/Orbits: No acute finding. Other: None. IMPRESSION: 1. No acute intracranial abnormality. 2. Advanced chronic microvascular ischemic changes of the periventricular and subcortical white matter. Electronically Signed   By: Keane Police D.O.   On: 03/12/2022 17:59   DG Chest 2 View  Result Date: 03/12/2022 CLINICAL DATA:  Cough with altered mental status for 1 day. EXAM: CHEST - 2 VIEW COMPARISON:  Radiographs 02/20/2022 and 07/16/2021.  CT 03/08/2022. FINDINGS: 1522 hours. The heart size and mediastinal contours are stable with aortic atherosclerosis. Unchanged left greater than right pleural effusions with associated left basilar airspace disease. There is possible mild pulmonary edema. No pneumothorax or acute osseous abnormality. Telemetry leads overlie the chest. IMPRESSION: Unchanged  left greater than right pleural effusions and left basilar airspace disease. Possible mild pulmonary edema. Electronically Signed   By: Richardean Sale M.D.   On: 03/12/2022 15:44    Pending Labs Unresulted Labs (From admission, onward)     Start     Ordered   03/14/22 0500  CBC with Differential/Platelet  Tomorrow morning,   R        03/13/22 1844   03/13/22 0500  Comprehensive metabolic panel  Daily,   R      03/12/22 1852   03/13/22 0500  CBC with Differential/Platelet  Daily,   R      03/12/22 1852   03/13/22 0500  C-reactive protein  Daily,   R      03/12/22 1852   03/13/22 0500  Ferritin  Daily,   R      03/12/22 1852            Vitals/Pain Today's Vitals   03/14/22 0859 03/14/22 1200  03/14/22 1300 03/14/22 1342  BP: (!) 142/66 (!) 157/70 (!) 186/114   Pulse: (!) 51 (!) 51 (!) 57   Resp: '14 16 19   '$ Temp: 97.6 F (36.4 C)   97.7 F (36.5 C)  TempSrc: Oral   Oral  SpO2: 96% 95% 96%   Weight:      Height:      PainSc:        Isolation Precautions Airborne and Contact precautions  Medications Medications  molnupiravir EUA (LAGEVRIO) capsule 800 mg (800 mg Oral Given 03/14/22 0903)  amLODipine (NORVASC) tablet 2.5 mg (2.5 mg Oral Given 03/13/22 2128)  atorvastatin (LIPITOR) tablet 40 mg (40 mg Oral Given 03/13/22 2128)  apixaban (ELIQUIS) tablet 2.5 mg (2.5 mg Oral Given 03/14/22 0901)  gabapentin (NEURONTIN) capsule 300 mg (300 mg Oral Given 03/14/22 0901)  zinc sulfate capsule 220 mg (220 mg Oral Given 03/14/22 0900)  thiamine (VITAMIN B1) tablet 50 mg (50 mg Oral Given 03/14/22 0900)  mometasone-formoterol (DULERA) 200-5 MCG/ACT inhaler 2 puff (2 puffs Inhalation Given 03/14/22 0910)  sodium chloride flush (NS) 0.9 % injection 3 mL (3 mLs Intravenous Given 03/14/22 0918)  acetaminophen (TYLENOL) tablet 650 mg (has no administration in time range)    Or  acetaminophen (TYLENOL) suppository 650 mg (has no administration in time range)  polyethylene glycol  (MIRALAX / GLYCOLAX) packet 17 g (has no administration in time range)  albuterol (PROVENTIL) (2.5 MG/3ML) 0.083% nebulizer solution 2.5 mg (has no administration in time range)  ipratropium (ATROVENT) nebulizer solution 0.5 mg (0.5 mg Nebulization Given 03/14/22 0911)  predniSONE (DELTASONE) tablet 50 mg (50 mg Oral Given 03/14/22 0900)  insulin aspart (novoLOG) injection 0-9 Units (1 Units Subcutaneous Given 03/14/22 1340)  furosemide (LASIX) tablet 40 mg (40 mg Oral Given 03/14/22 0900)  potassium chloride SA (KLOR-CON M) CR tablet 40 mEq (40 mEq Oral Given 03/14/22 1049)  methylPREDNISolone sodium succinate (SOLU-MEDROL) 125 mg/2 mL injection 125 mg (125 mg Intravenous Given 03/12/22 1940)  ipratropium-albuterol (DUONEB) 0.5-2.5 (3) MG/3ML nebulizer solution 3 mL (3 mLs Nebulization Given 03/12/22 1926)  magnesium sulfate IVPB 2 g 50 mL (0 g Intravenous Stopped 03/14/22 1038)    Mobility walks with device Moderate fall risk   Focused Assessments Pulmonary Assessment Handoff:  Lung sounds: Bilateral Breath Sounds: Clear O2 Device: Nasal Cannula O2 Flow Rate (L/min): 1 L/min    R Recommendations: See Admitting Provider Note  Report given to:   Additional Notes:

## 2022-03-14 NOTE — Evaluation (Signed)
Occupational Therapy Evaluation Patient Details Name: Penny Miller MRN: [id number] DOB: [birth year] Today's Date: 03/14/2022   History of Present Illness Pt is a 88 y/o female admitted for acute encephalopathy, COVID-19 infection and mild asthma exacerbation. PMH: HTN, CVA, a fib, CKD, DM, spinal stenosis.   Clinical Impression   PTA, pt lives with family, typically Modified Independent with ADLs and mobility using Rollator. Pt presents now with minor deficits in standing balance, cardiopulmonary endurance, strength and cognition. Overall, pt requires Min A for bed mobility, transfers with RW (initial posterior lean with assist to correct). Pt able to take steps in room with RW though limited by progressive nausea and dizziness (16 point systolic BP drop). Pt requires Setup for UB ADL and overall Min A for LB ADLs. Anticipate good progress acutely to return home with HHOT follow up.   SpO2 >95% on 2 LO2, HR 50-60s      Recommendations for follow up therapy are one component of a multi-disciplinary discharge planning process, led by the attending physician.  Recommendations may be updated based on patient status, additional functional criteria and insurance authorization.   Follow Up Recommendations  Home health OT     Assistance Recommended at Discharge Frequent or constant Supervision/Assistance  Patient can return home with the following A little help with walking and/or transfers;A little help with bathing/dressing/bathroom;Assistance with cooking/housework;Direct supervision/assist for medications management    Functional Status Assessment  Patient has had a recent decline in their functional status and demonstrates the ability to make significant improvements in function in a reasonable and predictable amount of time.  Equipment Recommendations  None recommended by OT    Recommendations for Other Services       Precautions / Restrictions Precautions Precautions: Fall;Other  (comment) Precaution Comments: monitor O2 Restrictions Weight Bearing Restrictions: No      Mobility Bed Mobility Overal bed mobility: Needs Assistance Bed Mobility: Supine to Sit, Sit to Supine     Supine to sit: Min guard, HOB elevated Sit to supine: Min assist   General bed mobility comments: assist for LE back onto stretcher    Transfers Overall transfer level: Needs assistance Equipment used: Rolling walker (2 wheels) Transfers: Sit to/from Stand Sit to Stand: Min assist           General transfer comment: initial posterior bias with assist needed to correct balance. after approx 10 sec in standing, balance normalized though dizziness and nausea progressed      Balance Overall balance assessment: Needs assistance Sitting-balance support: No upper extremity supported, Feet supported Sitting balance-Leahy Scale: Fair     Standing balance support: Bilateral upper extremity supported, During functional activity Standing balance-Leahy Scale: Poor                             ADL either performed or assessed with clinical judgement   ADL Overall ADL's : Needs assistance/impaired Eating/Feeding: Independent   Grooming: Min guard;Standing   Upper Body Bathing: Set up;Sitting   Lower Body Bathing: Minimal assistance;Sit to/from stand   Upper Body Dressing : Set up;Sitting   Lower Body Dressing: Minimal assistance;Sit to/from stand   Toilet Transfer: Minimal assistance;Stand-pivot;BSC/3in1;Rolling walker (2 wheels)   Toileting- Clothing Manipulation and Hygiene: Minimal assistance;Sitting/lateral lean;Sit to/from stand         General ADL Comments: Limited by weakness, dizziness/nausea in standing after prolonged time in bed.     Vision Ability to See in Adequate Light: 0 Adequate  Patient Visual Report: No change from baseline Vision Assessment?: No apparent visual deficits     Perception     Praxis      Pertinent Vitals/Pain Pain  Assessment Pain Assessment: No/denies pain     Hand Dominance Right   Extremity/Trunk Assessment Upper Extremity Assessment Upper Extremity Assessment: Generalized weakness   Lower Extremity Assessment Lower Extremity Assessment: Defer to PT evaluation   Cervical / Trunk Assessment Cervical / Trunk Assessment: Normal   Communication Communication Communication: No difficulties   Cognition Arousal/Alertness: Awake/alert Behavior During Therapy: WFL for tasks assessed/performed Overall Cognitive Status: Impaired/Different from baseline Area of Impairment: Awareness                           Awareness: Emergent   General Comments: improving cognition per family present, pt able to report PLOF, follow directions and participate functionally     General Comments  granddaughter present, planned to assist pt with bathing after OT eval    Exercises     Shoulder Instructions      Home Living Family/patient expects to be discharged to:: Private residence Living Arrangements: Children Available Help at Discharge: Family;Available 24 hours/day Type of Home: House Home Access: Ramped entrance     Home Layout: One level     Bathroom Shower/Tub: Teacher, early years/pre: Standard     Home Equipment: Conservation officer, nature (2 wheels);Rollator (4 wheels);Shower seat;BSC/3in1;Wheelchair - manual   Additional Comments: granddaughter lives 5 min away      Prior Functioning/Environment Prior Level of Function : Needs assist             Mobility Comments: use of Rollator in the home ADLs Comments: MOD I for sponge bathing, dressing, basic meal prep. Has hired assist as needed for showering assist        OT Problem List: Decreased strength;Decreased activity tolerance;Impaired balance (sitting and/or standing);Cardiopulmonary status limiting activity      OT Treatment/Interventions: Self-care/ADL training;Therapeutic exercise;DME and/or AE  instruction;Energy conservation;Therapeutic activities;Patient/family education;Balance training    OT Goals(Current goals can be found in the care plan section) Acute Rehab OT Goals Patient Stated Goal: feel better, get strength back and be able to walk with therapist OT Goal Formulation: With patient/family Time For Goal Achievement: 03/28/22 Potential to Achieve Goals: Good ADL Goals Pt Will Perform Lower Body Bathing: with modified independence;sit to/from stand Pt Will Perform Lower Body Dressing: with modified independence;sitting/lateral leans;sit to/from stand Pt Will Transfer to Toilet: with modified independence;ambulating  OT Frequency: Min 2X/week    Co-evaluation              AM-PAC OT "6 Clicks" Daily Activity     Outcome Measure Help from another person eating meals?: None Help from another person taking care of personal grooming?: A Little Help from another person toileting, which includes using toliet, bedpan, or urinal?: A Little Help from another person bathing (including washing, rinsing, drying)?: A Little Help from another person to put on and taking off regular upper body clothing?: A Little Help from another person to put on and taking off regular lower body clothing?: A Lot 6 Click Score: 18   End of Session Equipment Utilized During Treatment: Gait belt;Rolling walker (2 wheels);Oxygen Nurse Communication: Mobility status  Activity Tolerance: Patient limited by fatigue Patient left: in bed;with call bell/phone within reach;with family/visitor present  OT Visit Diagnosis: Unsteadiness on feet (R26.81);Muscle weakness (generalized) (M62.81)  Time: 9090-3014 OT Time Calculation (min): 17 min Charges:  OT General Charges $OT Visit: 1 Visit OT Evaluation $OT Eval Low Complexity: 1 Low  Malachy Chamber, OTR/L Acute Rehab Services Office: [phone]   Layla Maw 03/14/2022, 3:06 PM

## 2022-03-15 ENCOUNTER — Inpatient Hospital Stay (HOSPITAL_COMMUNITY): Payer: Medicare Other

## 2022-03-15 DIAGNOSIS — E039 Hypothyroidism, unspecified: Secondary | ICD-10-CM | POA: Diagnosis not present

## 2022-03-15 DIAGNOSIS — G9340 Encephalopathy, unspecified: Secondary | ICD-10-CM | POA: Diagnosis not present

## 2022-03-15 DIAGNOSIS — E1142 Type 2 diabetes mellitus with diabetic polyneuropathy: Secondary | ICD-10-CM | POA: Diagnosis not present

## 2022-03-15 DIAGNOSIS — U071 COVID-19: Secondary | ICD-10-CM | POA: Diagnosis not present

## 2022-03-15 LAB — GLUCOSE, CAPILLARY
Glucose-Capillary: 225 mg/dL — ABNORMAL HIGH (ref 70–99)
Glucose-Capillary: 112 mg/dL — ABNORMAL HIGH (ref 70–99)
Glucose-Capillary: 134 mg/dL — ABNORMAL HIGH (ref 70–99)
Glucose-Capillary: 202 mg/dL — ABNORMAL HIGH (ref 70–99)
Glucose-Capillary: 195 mg/dL — ABNORMAL HIGH (ref 70–99)

## 2022-03-15 LAB — CBC WITH DIFFERENTIAL/PLATELET
Abs Immature Granulocytes: 0.02 10*3/uL (ref 0.00–0.07)
Basophils Absolute: 0 10*3/uL (ref 0.0–0.1)
Basophils Relative: 0 %
Eosinophils Absolute: 0 10*3/uL (ref 0.0–0.5)
Eosinophils Relative: 0 %
HCT: 34.5 % — ABNORMAL LOW (ref 36.0–46.0)
Hemoglobin: 11 g/dL — ABNORMAL LOW (ref 12.0–15.0)
Immature Granulocytes: 0 %
Lymphocytes Relative: 12 %
Lymphs Abs: 0.7 10*3/uL (ref 0.7–4.0)
MCH: 32.1 pg (ref 26.0–34.0)
MCHC: 31.9 g/dL (ref 30.0–36.0)
MCV: 100.6 fL — ABNORMAL HIGH (ref 80.0–100.0)
Monocytes Absolute: 0.7 10*3/uL (ref 0.1–1.0)
Monocytes Relative: 12 %
Neutro Abs: 4.8 10*3/uL (ref 1.7–7.7)
Neutrophils Relative %: 76 %
Platelets: 133 10*3/uL — ABNORMAL LOW (ref 150–400)
RBC: 3.43 MIL/uL — ABNORMAL LOW (ref 3.87–5.11)
RDW: 12.6 % (ref 11.5–15.5)
WBC: 6.3 10*3/uL (ref 4.0–10.5)
nRBC: 0 % (ref 0.0–0.2)

## 2022-03-15 LAB — COMPREHENSIVE METABOLIC PANEL
ALT: 13 U/L (ref 0–44)
AST: 21 U/L (ref 15–41)
Albumin: 2.8 g/dL — ABNORMAL LOW (ref 3.5–5.0)
Alkaline Phosphatase: 61 U/L (ref 38–126)
Anion gap: 8 (ref 5–15)
BUN: 35 mg/dL — ABNORMAL HIGH (ref 8–23)
CO2: 28 mmol/L (ref 22–32)
Calcium: 8.6 mg/dL — ABNORMAL LOW (ref 8.9–10.3)
Chloride: 99 mmol/L (ref 98–111)
Creatinine, Ser: 1.47 mg/dL — ABNORMAL HIGH (ref 0.44–1.00)
GFR, Estimated: 34 mL/min — ABNORMAL LOW (ref >60)
Glucose, Bld: 133 mg/dL — ABNORMAL HIGH (ref 70–99)
Potassium: 4.5 mmol/L (ref 3.5–5.1)
Sodium: 135 mmol/L (ref 135–145)
Total Bilirubin: 0.4 mg/dL (ref 0.3–1.2)
Total Protein: 5.2 g/dL — ABNORMAL LOW (ref 6.5–8.1)

## 2022-03-15 LAB — PHOSPHORUS: Phosphorus: 3.6 mg/dL (ref 2.5–4.6)

## 2022-03-15 LAB — MAGNESIUM: Magnesium: 2.1 mg/dL (ref 1.7–2.4)

## 2022-03-15 LAB — URINE CULTURE: Culture: >=100000 — AB

## 2022-03-15 LAB — C-REACTIVE PROTEIN: CRP: 1.2 mg/dL — ABNORMAL HIGH (ref <1.0)

## 2022-03-15 LAB — FERRITIN: Ferritin: 19 ng/mL (ref 11–307)

## 2022-03-15 LAB — LACTATE DEHYDROGENASE: LDH: 219 U/L — ABNORMAL HIGH (ref 98–192)

## 2022-03-15 LAB — SEDIMENTATION RATE: Sed Rate: 7 mm/hr (ref 0–22)

## 2022-03-15 LAB — FIBRINOGEN: Fibrinogen: 282 mg/dL (ref 210–475)

## 2022-03-15 LAB — D-DIMER, QUANTITATIVE: D-Dimer, Quant: 1.08 ug/mL-FEU — ABNORMAL HIGH (ref 0.00–0.50)

## 2022-03-15 MED ORDER — HYDROCOD POLI-CHLORPHE POLI ER 10-8 MG/5ML PO SUER
5.0000 mL | Freq: Two times a day (BID) | ORAL | Status: DC | PRN
  Administered 2022-03-18: 5 mL via ORAL
  Filled 2022-03-15: qty 5

## 2022-03-15 MED ORDER — METHYLPREDNISOLONE SODIUM SUCC 40 MG IJ SOLR
40.0000 mg | Freq: Two times a day (BID) | INTRAMUSCULAR | Status: DC
  Administered 2022-03-15 – 2022-03-18 (×7): 40 mg via INTRAVENOUS
  Filled 2022-03-15 (×8): qty 1

## 2022-03-15 MED ORDER — GUAIFENESIN ER 600 MG PO TB12
1200.0000 mg | ORAL_TABLET | Freq: Two times a day (BID) | ORAL | Status: DC
  Administered 2022-03-15 – 2022-03-21 (×12): 1200 mg via ORAL
  Filled 2022-03-15 (×12): qty 2

## 2022-03-15 NOTE — Evaluation (Signed)
Physical Therapy Evaluation Patient Details Name: Penny Miller MRN: [id number] DOB: 1932/02/08 Today's Date: 03/15/2022  History of Present Illness  Pt is a 88 y/o female admitted for acute encephalopathy, COVID-19 infection and mild asthma exacerbation. PMH: HTN, CVA, a fib, CKD, DM, spinal stenosis.  Clinical Impression  Pt admitted with above diagnosis and presents to PT with functional limitations due to deficits listed below (See PT problem list). Pt needs skilled PT to maximize independence and safety to allow discharge to home with assist of family. Limited by decr activity tolerance and weakness. Currently requiring O2 with activity and at times with rest.         Recommendations for follow up therapy are one component of a multi-disciplinary discharge planning process, led by the attending physician.  Recommendations may be updated based on patient status, additional functional criteria and insurance authorization.  Follow Up Recommendations Home health PT      Assistance Recommended at Discharge Frequent or constant Supervision/Assistance  Patient can return home with the following  A little help with walking and/or transfers;A little help with bathing/dressing/bathroom;Assistance with cooking/housework;Help with stairs or ramp for entrance;Assist for transportation    Equipment Recommendations None recommended by PT  Recommendations for Other Services       Functional Status Assessment Patient has had a recent decline in their functional status and demonstrates the ability to make significant improvements in function in a reasonable and predictable amount of time.     Precautions / Restrictions Precautions Precautions: Fall;Other (comment) Precaution Comments: monitor O2 Restrictions Weight Bearing Restrictions: No      Mobility  Bed Mobility Overal bed mobility: Needs Assistance Bed Mobility: Supine to Sit     Supine to sit: Min assist, HOB elevated      General bed mobility comments: Assist to elevate trunk into sitting    Transfers Overall transfer level: Needs assistance Equipment used: Rollator (4 wheels) Transfers: Sit to/from Stand Sit to Stand: Min assist           General transfer comment: Assist for balance and rising initially with slight posterior bias    Ambulation/Gait Ambulation/Gait assistance: Min guard, Min assist Gait Distance (Feet): 40 Feet (40' x 1, 20' x 1) Assistive device: Rollator (4 wheels) Gait Pattern/deviations: Step-through pattern, Decreased stride length Gait velocity: decr Gait velocity interpretation: <1.31 ft/sec, indicative of household ambulator   General Gait Details: Assist for balance.  Stairs            Wheelchair Mobility    Modified Rankin (Stroke Patients Only)       Balance Overall balance assessment: Needs assistance Sitting-balance support: No upper extremity supported, Feet supported Sitting balance-Leahy Scale: Fair     Standing balance support: Bilateral upper extremity supported, During functional activity Standing balance-Leahy Scale: Poor Standing balance comment: rollator and min guard for static standing                             Pertinent Vitals/Pain Pain Assessment Pain Assessment: No/denies pain    Home Living Family/patient expects to be discharged to:: Private residence Living Arrangements: Children Available Help at Discharge: Family;Available 24 hours/day Type of Home: House Home Access: Ramped entrance       Home Layout: One level Home Equipment: Conservation officer, nature (2 wheels);Rollator (4 wheels);Shower seat;BSC/3in1;Wheelchair - manual Additional Comments: granddaughter lives 5 min away    Prior Function Prior Level of Function : Needs assist  Mobility Comments: use of Rollator in the home ADLs Comments: MOD I for sponge bathing, dressing, basic meal prep. Has hired assist as needed for showering assist      Hand Dominance   Dominant Hand: Right    Extremity/Trunk Assessment   Upper Extremity Assessment Upper Extremity Assessment: Defer to OT evaluation    Lower Extremity Assessment Lower Extremity Assessment: Generalized weakness    Cervical / Trunk Assessment Cervical / Trunk Assessment: Normal  Communication   Communication: No difficulties  Cognition Arousal/Alertness: Awake/alert Behavior During Therapy: WFL for tasks assessed/performed Overall Cognitive Status: Impaired/Different from baseline Area of Impairment: Awareness                           Awareness: Emergent            General Comments General comments (skin integrity, edema, etc.): SpO2 97% on 2L at rest. 86% with activity on RA    Exercises     Assessment/Plan    PT Assessment Patient needs continued PT services  PT Problem List Decreased strength;Decreased activity tolerance;Decreased balance;Decreased mobility;Cardiopulmonary status limiting activity       PT Treatment Interventions DME instruction;Gait training;Therapeutic activities;Functional mobility training;Therapeutic exercise;Balance training;Patient/family education    PT Goals (Current goals can be found in the Care Plan section)  Acute Rehab PT Goals Patient Stated Goal: Return to prior level PT Goal Formulation: With patient Time For Goal Achievement: 03/29/22 Potential to Achieve Goals: Good    Frequency Min 3X/week     Co-evaluation               AM-PAC PT "6 Clicks" Mobility  Outcome Measure Help needed turning from your back to your side while in a flat bed without using bedrails?: A Little Help needed moving from lying on your back to sitting on the side of a flat bed without using bedrails?: A Little Help needed moving to and from a bed to a chair (including a wheelchair)?: A Little Help needed standing up from a chair using your arms (e.g., wheelchair or bedside chair)?: A Little Help needed to  walk in hospital room?: A Little Help needed climbing 3-5 steps with a railing? : A Lot 6 Click Score: 17    End of Session Equipment Utilized During Treatment: Oxygen Activity Tolerance: Patient tolerated treatment well Patient left: in chair;with call bell/phone within reach;with chair alarm set Nurse Communication: Mobility status PT Visit Diagnosis: Unsteadiness on feet (R26.81);Other abnormalities of gait and mobility (R26.89);Muscle weakness (generalized) (M62.81)    Time: 7209-4709 PT Time Calculation (min) (ACUTE ONLY): 37 min   Charges:   PT Evaluation $PT Eval Moderate Complexity: 1 Mod PT Treatments $Gait Training: 8-22 mins        [place] 03/15/2022, 1:33 PM

## 2022-03-15 NOTE — Plan of Care (Signed)
  Problem: Education: Goal: Knowledge of risk factors and measures for prevention of condition will improve Outcome: Progressing   Problem: Coping: Goal: Psychosocial and spiritual needs will be supported Outcome: Progressing   Problem: Respiratory: Goal: Will maintain a patent airway Outcome: Progressing Goal: Complications related to the disease process, condition or treatment will be avoided or minimized Outcome: Progressing   Problem: Education: Goal: Ability to describe self-care measures that may prevent or decrease complications (Diabetes Survival Skills Education) will improve Outcome: Progressing Goal: Individualized Educational Video(s) Outcome: Progressing   Problem: Coping: Goal: Ability to adjust to condition or change in health will improve Outcome: Progressing   Problem: Fluid Volume: Goal: Ability to maintain a balanced intake and output will improve Outcome: Progressing   Problem: Health Behavior/Discharge Planning: Goal: Ability to identify and utilize available resources and services will improve Outcome: Progressing Goal: Ability to manage health-related needs will improve Outcome: Progressing   Problem: Metabolic: Goal: Ability to maintain appropriate glucose levels will improve Outcome: Progressing   Problem: Nutritional: Goal: Maintenance of adequate nutrition will improve Outcome: Progressing Goal: Progress toward achieving an optimal weight will improve Outcome: Progressing   Problem: Skin Integrity: Goal: Risk for impaired skin integrity will decrease Outcome: Progressing   Problem: Tissue Perfusion: Goal: Adequacy of tissue perfusion will improve Outcome: Progressing   Problem: Education: Goal: Knowledge of General Education information will improve Description: Including pain rating scale, medication(s)/side effects and non-pharmacologic comfort measures Outcome: Progressing   Problem: Health Behavior/Discharge Planning: Goal:  Ability to manage health-related needs will improve Outcome: Progressing   Problem: Clinical Measurements: Goal: Ability to maintain clinical measurements within normal limits will improve Outcome: Progressing Goal: Will remain free from infection Outcome: Progressing Goal: Diagnostic test results will improve Outcome: Progressing Goal: Respiratory complications will improve Outcome: Progressing Goal: Cardiovascular complication will be avoided Outcome: Progressing   Problem: Activity: Goal: Risk for activity intolerance will decrease Outcome: Progressing   Problem: Nutrition: Goal: Adequate nutrition will be maintained Outcome: Progressing   Problem: Coping: Goal: Level of anxiety will decrease Outcome: Progressing   Problem: Elimination: Goal: Will not experience complications related to bowel motility Outcome: Progressing Goal: Will not experience complications related to urinary retention Outcome: Progressing   Problem: Pain Managment: Goal: General experience of comfort will improve Outcome: Progressing   Problem: Safety: Goal: Ability to remain free from injury will improve Outcome: Progressing   Problem: Skin Integrity: Goal: Risk for impaired skin integrity will decrease Outcome: Progressing

## 2022-03-15 NOTE — Progress Notes (Signed)
SATURATION QUALIFICATIONS: (This note is used to comply with regulatory documentation for home oxygen)  Patient Saturations on Room Air at Rest = 89%  Patient Saturations on Room Air while Ambulating = 86%  Patient Saturations on 2 Liters of oxygen while Ambulating = 93%  Please briefly explain why patient needs home oxygen:Unable to maintain adequate level oxygenation with activity without supplemental O2 .  [place] Office [phone]

## 2022-03-15 NOTE — Progress Notes (Signed)
PROGRESS NOTE    Penny Miller  YKD:[id number] DOB: 1932/01/26 DOA: 03/12/2022 PCP: [name], MD   Brief Narrative:  HPI per Dr. Neva Seat Penny Miller is a 88 y.o. female with medical history significant of spinal stenosis, sinus node dysfunction, diabetes, neuropathy, atrial fibrillation, hypertension, overactive bladder, anemia, asthma, hypothyroidism, CVA, CKD 4 presenting with altered mental status.   History obtained with assistance of patient's granddaughter and chart review due to patient's encephalopathy.  She was last noted to be normal yesterday morning.  Today she was noted to be confused and oriented x 1 only when at baseline she is oriented x 3 and conversant and able to perform ADLs.  She has a history of chronic cystitis and family was noting frequent trips to the bathroom without being able to go.   Patient unable to participate in full review of systems due to altered mentation.   ED Course: Vital signs in the ED significant for blood pressure in the 397Q to 734L systolic.  Heart rate in the 50s to 60s.  Lab workup included CMP with creatinine stable at 1.44, glucose 100, protein 5.9, albumin 3.4.  CBC with hemoglobin stable at 10.4, platelets 140.  TSH normal.  Ethanol level negative.  Aspirin and Tylenol levels negative.  Urinalysis normal.  Urine culture pending.  Respiratory panel for flu and COVID positive.  Magnesium normal.  CT head without acute abnormality.  Chest x-ray with stable left greater than right pleural effusion and left basilar airspace disease.  Patient started on molnupiravir, Solu-Medrol, DuoNebs in the ED.     **Interim History She Is slowly improving and her mentation is better.  Continues to feel a little dyspneic and felt dizzy upon standing.  Will check orthostatics in the morning and give her gentle IV fluid hydration today.  Labs relatively stable but D-dimer slightly elevated.  If continues to be elevated tomorrow may pursue CTA of  the chest and lower extremity venous duplex.  OT recommending home health but PT does still evaluate and treat  03/15/2022: PT evaluated and recommending home health still.  She did desaturate on amatory home O2 screen.  CRP minimally went up but will change her steroids from prednisone to IV Solu-Medrol 40 mg twice daily.  She continues to be dyspneic and coughing.  Will repeat chest x-ray in the a.m.   Assessment and Plan:  Acute encephalopathy, improved COVID-19 infection Mild asthma exacerbation > Patient presenting with altered mental status for the past day or less.  Family had some concern for possible recurrent UTI given her history and frequent trips to the bathroom. > In the ED workup showed normal urinalysis.  No leukocytosis.  Chest x-ray stable but did show chronic pleural effusion and basilar airspace disease.  CT head without acute abnormality either.  No significant electrolyte derangements. > Respiratory screening did come back positive for COVID-19 infection which is presumed etiology of encephalopathy.  She was also noted to be wheezing in the ED and received steroids and breathing treatment.  Also started on molnupiravir. -Continue to monitor on telemetry - Continue daily steroids and is now on prednisone 50 mg p.o. daily however will go back to IV Solumedrol 40 mg BID -COVID-19 Labs  Recent Labs    03/13/22 0449 03/14/22 0302 03/15/22 0330  DDIMER  --  1.16* 1.08*  FERRITIN '20 22 19  '$ LDH  --  202* 219*  CRP <0.5 0.6 1.2*    Lab Results  Component Value Date  SARSCOV2NAA POSITIVE (A) 03/12/2022   SARSCOV2NAA NEGATIVE 07/16/2021   [place] NEGATIVE 11/20/2020    -SpO2: 96 % O2 Flow Rate (L/min): 2 L/min  - Scheduled Atrovent - As needed albuterol -Leukopenia is likely from COVID infection -Ordering flutter valve and incentive spirometry as well as guaifenesin - Continue molnupiravir for 5 days total -She will need an ambulatory home O2 screen prior to  discharge and she did desaturate today. -Continue to  Trend daily labs and inflammatory markers -Need PT and OT to further evaluate and treat; PT/OT both recommending Home Health  Hypertension - Continue home Amlodipine - Hold home Lasix given her dizziness upon standing we will give her gentle IV fluid hydration -Continue monitor blood pressures per protocol -Last BP reading was 148/88   Thrombocytopenia  -The patient's platelet count has gone from 120 -> 133 -Continue to Monitor and Trend and Repeat CBC in the AM    History of CVA - Continue home atorvastatin - Continue home Eliquis   Dizziness with standing -SHe got dizzy and almost vomited while standing for quite a while.  Given give gentle IV fluid hydration currently and now stopped and check orthostatics which is still pending -check TSH in the morning -PT evaluation recommending Home Health   Elevated D-dimer -Mild and likely related to COVID -D-Dimer went from 1.16 -> 1.08 -If he continues to be elevated may obtain a CTA of the chest to evaluate for PE but already anticoagulated on Apixaban    Atrial fibrillation - Continue home apixaban - Not on rate control or rhythm control medication currently   Neuropathy - Continue Home Gabapentin   Diabetes Mellitus Type 2 - SSI and his CBGs likely will be elevated in the setting of steroid demargination -CBGs ranging from 112-225   Normocytic/Macrocytic Anemia > Hemoglobin stable in the ED; now hemoglobin/hematocrit is now 10.2/32.1 -> 11.0/34.5 with an MCV of 100.6 -Check anemia panel in the a.m. - Continue to trend CBC - Continue with home zinc and thiamine   Hypoalbuminemia -Patient's albumin level is now 2.6 -> 2.8 -Continue monitor and trend and repeat CMP in a.m.   CKD 3B > Creatinine stable at 1.44 in the ED. Now BUNs/creatinine has gone from 17/1.27 -> 26/1.33 -> 35/1.47 -Avoid further nephrotoxic medications, contrast dyes, hypotension and dehydration  ensure adequate renal perfusion -Will stop gentle IV for hydration at 75 MLS per hour -continue monitor and trend renal function carefully and repeat CMP in the a.m.  Asymptomatic Bacteruria -UA not really convincing for UTI and Not Symptomatic  -Urine Cx done and showed:  ulture  Abnormal  >=100,000 COLONIES/mL DIPHTHEROIDS(CORYNEBACTERIUM SPECIES) Standardized susceptibility testing for this organism is not available. 20,000 COLONIES/mL ENTEROCOCCUS FAECALIS   Report Status 03/15/2022 FINAL  Organism ID, Bacteria ENTEROCOCCUS FAECALIS Abnormal   Resulting Agency [facility]     Susceptibility   Enterococcus faecalis    MIC    AMPICILLIN <=2 SENSITIVE Sensitive    NITROFURANTOIN <=16 SENSIT... Sensitive    VANCOMYCIN 2 SENSITIVE Sensitive            DVT prophylaxis: apixaban (ELIQUIS) tablet 2.5 mg Start: 03/12/22 2200 apixaban (ELIQUIS) tablet 2.5 mg    Code Status: DNR Family Communication: No family currently at bedside  Disposition Plan:  Level of care: Telemetry Medical Status is: Inpatient Remains inpatient appropriate because: Needs Orthostatic VS  Consultants:  None  Procedures:  As above  Antimicrobials:  Anti-infectives (From admission, onward)    Start  Dose/Rate Route Frequency Ordered Stop   03/12/22 1900  molnupiravir EUA (LAGEVRIO) capsule 800 mg        4 capsule Oral 2 times daily 03/12/22 1800 03/17/22 2159       Subjective: Seen and examined at bedside and she continues to cough and states that she has some abdominal soreness from the coughing which has been doing.  States that her cough is nonproductive.  States that she gets short of breath when ambulating.  No nausea or vomiting.  Denies any lightheadedness or dizziness No other concerns or complaints at this time.   Objective: Vitals:   03/14/22 2132 03/14/22 2300 03/15/22 0800 03/15/22 1200  BP: (!) 151/81 (!) 166/81 (!) 147/70 (!) 148/88  Pulse:  (!) 56 (!) 49 (!) 55  Resp:  '15  15 16  '$ Temp:  98.2 F (36.8 C)    TempSrc:  Oral    SpO2:  98% 96% 96%  Weight:      Height:       No intake or output data in the 24 hours ending 03/15/22 1547 Filed Weights   03/12/22 2134  Weight: 69.9 kg   Examination: Physical Exam:  Constitutional: WN/WD elderly Caucasian female currently no acute distress  Respiratory: Diminished to auscultation bilaterally, no wheezing, rales, rhonchi or crackles. Normal respiratory effort and patient is not tachypenic. No accessory muscle use.  Unlabored breathing Cardiovascular: RRR, no murmurs / rubs / gallops. S1 and S2 auscultated.  Trace extremity edema.  Abdomen: Soft, non-tender, non-distended. Bowel sounds positive.  GU: Deferred. Musculoskeletal: No clubbing / cyanosis of digits/nails. No joint deformity upper and lower extremities. Skin: No rashes, lesions, ulcers on limited skin evaluation. No induration; Warm and dry.  Neurologic: CN 2-12 grossly intact with no focal deficits. Romberg sign and cerebellar reflexes not assessed.  Psychiatric: Normal judgment and insight. Alert and oriented x 3. Normal mood and appropriate affect.   Data Reviewed: I have personally reviewed following labs and imaging studies  CBC: Recent Labs  Lab 03/12/22 1325 03/13/22 0449 03/14/22 0302 03/15/22 0330  WBC 4.0 3.8* 6.1 6.3  NEUTROABS 2.6 3.1 4.3 4.8  HGB 10.4* 11.7* 10.2* 11.0*  HCT 32.0* 35.0* 32.1* 34.5*  MCV 100.3* 99.4 102.2* 100.6*  PLT 140* 133* 120* 622*   Basic Metabolic Panel: Recent Labs  Lab 03/12/22 1325 03/13/22 0449 03/14/22 0302 03/15/22 0330  NA 137 136 139 135  K 3.8 3.7 3.2* 4.5  CL 102 100 105 99  CO2 '27 25 28 28  '$ GLUCOSE 100* 160* 107* 133*  BUN 17 17 26* 35*  CREATININE 1.44* 1.27* 1.33* 1.47*  CALCIUM 9.8 9.7 8.2* 8.6*  MG 1.9  --  1.8 2.1  PHOS  --   --  4.0 3.6   GFR: Estimated Creatinine Clearance: 24.8 mL/min (A) (by C-G formula based on SCr of 1.47 mg/dL (H)). Liver Function Tests: Recent  Labs  Lab 03/12/22 1325 03/13/22 0449 03/14/22 0302 03/15/22 0330  AST '20 26 21 21  '$ ALT '11 16 10 13  '$ ALKPHOS 63 66 51 61  BILITOT 0.3 0.4 0.3 0.4  PROT 5.9* 6.0* 4.9* 5.2*  ALBUMIN 3.4* 3.4* 2.6* 2.8*   No results for input(s): "LIPASE", "AMYLASE" in the last 168 hours. No results for input(s): "AMMONIA" in the last 168 hours. Coagulation Profile: No results for input(s): "INR", "PROTIME" in the last 168 hours. Cardiac Enzymes: No results for input(s): "CKTOTAL", "CKMB", "CKMBINDEX", "TROPONINI" in the last 168 hours. BNP (last 3 results)  Recent Labs    02/20/22 1150 03/04/22 0958  PROBNP 1,012.0* 863.0*   HbA1C: No results for input(s): "HGBA1C" in the last 72 hours. CBG: Recent Labs  Lab 03/14/22 0756 03/14/22 1312 03/14/22 1745 03/15/22 0756 03/15/22 1247  GLUCAP 94 135* 225* 112* 134*   Lipid Profile: No results for input(s): "CHOL", "HDL", "LDLCALC", "TRIG", "CHOLHDL", "LDLDIRECT" in the last 72 hours. Thyroid Function Tests: Recent Labs    03/12/22 1640  TSH 4.247   Anemia Panel: Recent Labs    03/14/22 0302 03/15/22 0330  FERRITIN 22 19   Sepsis Labs: No results for input(s): "PROCALCITON", "LATICACIDVEN" in the last 168 hours.  Recent Results (from the past 240 hour(s))  Resp panel by RT-PCR (RSV, Flu A&B, Covid) Anterior Nasal Swab     Status: Abnormal   Collection Time: 03/12/22  2:47 PM   Specimen: Anterior Nasal Swab  Result Value Ref Range Status   SARS Coronavirus 2 by RT PCR POSITIVE (A) NEGATIVE Final    Comment: (NOTE) SARS-CoV-2 target nucleic acids are DETECTED.  The SARS-CoV-2 RNA is generally detectable in upper respiratory specimens during the acute phase of infection. Positive results are indicative of the presence of the identified virus, but do not rule out bacterial infection or co-infection with other pathogens not detected by the test. Clinical correlation with patient history and other diagnostic information is  necessary to determine patient infection status. The expected result is Negative.  Fact Sheet for Patients: EntrepreneurPulse.com.au  Fact Sheet for Healthcare Providers: IncredibleEmployment.be  This test is not yet approved or cleared by the [place] FDA and  has been authorized for detection and/or diagnosis of SARS-CoV-2 by FDA under an Emergency Use Authorization (EUA).  This EUA will remain in effect (meaning this test can be used) for the duration of  the COVID-19 declaration under Section 564(b)(1) of the A ct, 21 U.S.C. section 360bbb-3(b)(1), unless the authorization is terminated or revoked sooner.     Influenza A by PCR NEGATIVE NEGATIVE Final   Influenza B by PCR NEGATIVE NEGATIVE Final    Comment: (NOTE) The Xpert Xpress SARS-CoV-2/FLU/RSV plus assay is intended as an aid in the diagnosis of influenza from Nasopharyngeal swab specimens and should not be used as a sole basis for treatment. Nasal washings and aspirates are unacceptable for Xpert Xpress SARS-CoV-2/FLU/RSV testing.  Fact Sheet for Patients: EntrepreneurPulse.com.au  Fact Sheet for Healthcare Providers: IncredibleEmployment.be  This test is not yet approved or cleared by the [place] FDA and has been authorized for detection and/or diagnosis of SARS-CoV-2 by FDA under an Emergency Use Authorization (EUA). This EUA will remain in effect (meaning this test can be used) for the duration of the COVID-19 declaration under Section 564(b)(1) of the Act, 21 U.S.C. section 360bbb-3(b)(1), unless the authorization is terminated or revoked.     Resp Syncytial Virus by PCR NEGATIVE NEGATIVE Final    Comment: (NOTE) Fact Sheet for Patients: EntrepreneurPulse.com.au  Fact Sheet for Healthcare Providers: IncredibleEmployment.be  This test is not yet approved or cleared by the [place] FDA  and has been authorized for detection and/or diagnosis of SARS-CoV-2 by FDA under an Emergency Use Authorization (EUA). This EUA will remain in effect (meaning this test can be used) for the duration of the COVID-19 declaration under Section 564(b)(1) of the Act, 21 U.S.C. section 360bbb-3(b)(1), unless the authorization is terminated or revoked.  Performed at [facility], [place] [address], [place], [place] [address]   Urine Culture  Status: Abnormal   Collection Time: 03/12/22  4:48 PM   Specimen: In/Out Cath Urine  Result Value Ref Range Status   Specimen Description IN/OUT CATH URINE  Final   Special Requests   Final    NONE Performed at [facility], [address] [place], [place] [address]    Culture (A)  Final    >=100,000 COLONIES/mL DIPHTHEROIDS(CORYNEBACTERIUM SPECIES) Standardized susceptibility testing for this organism is not available. 20,000 COLONIES/mL ENTEROCOCCUS FAECALIS    Report Status 03/15/2022 FINAL  Final   Organism ID, Bacteria ENTEROCOCCUS FAECALIS (A)  Final      Susceptibility   Enterococcus faecalis - MIC*    AMPICILLIN <=2 SENSITIVE Sensitive     NITROFURANTOIN <=16 SENSITIVE Sensitive     VANCOMYCIN 2 SENSITIVE Sensitive     * 20,000 COLONIES/mL ENTEROCOCCUS FAECALIS    Radiology Studies: DG CHEST PORT 1 VIEW  Result Date: 03/15/2022 CLINICAL DATA:  COVID. EXAM: PORTABLE CHEST 1 VIEW COMPARISON:  Chest x-ray from yesterday. FINDINGS: Unchanged mild cardiomegaly. Loop recorder again noted. Unchanged small left pleural effusion with left basilar atelectasis. The lungs are otherwise clear. No pneumothorax. No acute osseous abnormality. IMPRESSION: 1. Unchanged small left pleural effusion with left basilar atelectasis. Electronically Signed   By: Titus Dubin M.D.   On: 03/15/2022 08:11   DG CHEST PORT 1 VIEW  Result Date: 03/14/2022 CLINICAL DATA:  COVID EXAM: PORTABLE CHEST 1 VIEW COMPARISON:  Two days ago FINDINGS: Stable  heart size and mediastinal contours. Implantable cardiac device. Artifact from support hardware. Hazy density at the left base from pleural fluid and atelectasis by recent CT. No pneumothorax. IMPRESSION: Stable compared to 2 days prior. Mild left opacity from pleural fluid and atelectasis by recent chest CT. Electronically Signed   By: Jorje Guild M.D.   On: 03/14/2022 07:15    Scheduled Meds:  amLODipine  2.5 mg Oral QHS   apixaban  2.5 mg Oral BID   atorvastatin  40 mg Oral QHS   gabapentin  300 mg Oral TID   insulin aspart  0-9 Units Subcutaneous TID WC   ipratropium  0.5 mg Nebulization BID   methylPREDNISolone (SOLU-MEDROL) injection  40 mg Intravenous Q12H   molnupiravir EUA  4 capsule Oral BID   mometasone-formoterol  2 puff Inhalation BID   sodium chloride flush  3 mL Intravenous Q12H   thiamine  50 mg Oral Daily   zinc sulfate  220 mg Oral Daily   Continuous Infusions:   LOS: 2 days   Raiford Noble, DO [facility] Available via Epic secure chat 7am-7pm After these hours, please refer to coverage provider listed on [email or web address] 03/15/2022, 3:47 PM

## 2022-03-16 ENCOUNTER — Inpatient Hospital Stay (HOSPITAL_COMMUNITY): Payer: Medicare Other

## 2022-03-16 DIAGNOSIS — U071 COVID-19: Secondary | ICD-10-CM | POA: Diagnosis not present

## 2022-03-16 DIAGNOSIS — G9340 Encephalopathy, unspecified: Secondary | ICD-10-CM | POA: Diagnosis not present

## 2022-03-16 DIAGNOSIS — E1142 Type 2 diabetes mellitus with diabetic polyneuropathy: Secondary | ICD-10-CM | POA: Diagnosis not present

## 2022-03-16 DIAGNOSIS — E039 Hypothyroidism, unspecified: Secondary | ICD-10-CM | POA: Diagnosis not present

## 2022-03-16 LAB — CBC WITH DIFFERENTIAL/PLATELET
Abs Immature Granulocytes: 0.03 10*3/uL (ref 0.00–0.07)
Basophils Absolute: 0 10*3/uL (ref 0.0–0.1)
Basophils Relative: 0 %
Eosinophils Absolute: 0 10*3/uL (ref 0.0–0.5)
Eosinophils Relative: 0 %
HCT: 34.4 % — ABNORMAL LOW (ref 36.0–46.0)
Hemoglobin: 11.5 g/dL — ABNORMAL LOW (ref 12.0–15.0)
Immature Granulocytes: 1 %
Lymphocytes Relative: 10 %
Lymphs Abs: 0.6 10*3/uL — ABNORMAL LOW (ref 0.7–4.0)
MCH: 32.8 pg (ref 26.0–34.0)
MCHC: 33.4 g/dL (ref 30.0–36.0)
MCV: 98 fL (ref 80.0–100.0)
Monocytes Absolute: 0.4 10*3/uL (ref 0.1–1.0)
Monocytes Relative: 6 %
Neutro Abs: 4.7 10*3/uL (ref 1.7–7.7)
Neutrophils Relative %: 83 %
Platelets: 124 10*3/uL — ABNORMAL LOW (ref 150–400)
RBC: 3.51 MIL/uL — ABNORMAL LOW (ref 3.87–5.11)
RDW: 12.5 % (ref 11.5–15.5)
WBC: 5.7 10*3/uL (ref 4.0–10.5)
nRBC: 0 % (ref 0.0–0.2)

## 2022-03-16 LAB — COMPREHENSIVE METABOLIC PANEL
ALT: 14 U/L (ref 0–44)
AST: 20 U/L (ref 15–41)
Albumin: 3.1 g/dL — ABNORMAL LOW (ref 3.5–5.0)
Alkaline Phosphatase: 61 U/L (ref 38–126)
Anion gap: 9 (ref 5–15)
BUN: 42 mg/dL — ABNORMAL HIGH (ref 8–23)
CO2: 27 mmol/L (ref 22–32)
Calcium: 8.8 mg/dL — ABNORMAL LOW (ref 8.9–10.3)
Chloride: 98 mmol/L (ref 98–111)
Creatinine, Ser: 1.33 mg/dL — ABNORMAL HIGH (ref 0.44–1.00)
GFR, Estimated: 38 mL/min — ABNORMAL LOW (ref >60)
Glucose, Bld: 218 mg/dL — ABNORMAL HIGH (ref 70–99)
Potassium: 4.6 mmol/L (ref 3.5–5.1)
Sodium: 134 mmol/L — ABNORMAL LOW (ref 135–145)
Total Bilirubin: 0.5 mg/dL (ref 0.3–1.2)
Total Protein: 5.4 g/dL — ABNORMAL LOW (ref 6.5–8.1)

## 2022-03-16 LAB — GLUCOSE, CAPILLARY
Glucose-Capillary: 234 mg/dL — ABNORMAL HIGH (ref 70–99)
Glucose-Capillary: 201 mg/dL — ABNORMAL HIGH (ref 70–99)
Glucose-Capillary: 146 mg/dL — ABNORMAL HIGH (ref 70–99)

## 2022-03-16 LAB — FIBRINOGEN: Fibrinogen: 283 mg/dL (ref 210–475)

## 2022-03-16 LAB — FOLATE: Folate: 8.3 ng/mL (ref >5.9)

## 2022-03-16 LAB — RETICULOCYTES
Immature Retic Fract: 18 % — ABNORMAL HIGH (ref 2.3–15.9)
RBC.: 3.55 MIL/uL — ABNORMAL LOW (ref 3.87–5.11)
Retic Count, Absolute: 66.4 10*3/uL (ref 19.0–186.0)
Retic Ct Pct: 1.9 % (ref 0.4–3.1)

## 2022-03-16 LAB — IRON AND TIBC
Iron: 65 ug/dL (ref 28–170)
Saturation Ratios: 19 % (ref 10.4–31.8)
TIBC: 342 ug/dL (ref 250–450)
UIBC: 277 ug/dL

## 2022-03-16 LAB — SEDIMENTATION RATE: Sed Rate: 5 mm/hr (ref 0–22)

## 2022-03-16 LAB — FERRITIN: Ferritin: 19 ng/mL (ref 11–307)

## 2022-03-16 LAB — C-REACTIVE PROTEIN: CRP: <0.5 mg/dL (ref <1.0)

## 2022-03-16 LAB — LACTATE DEHYDROGENASE: LDH: 185 U/L (ref 98–192)

## 2022-03-16 LAB — MAGNESIUM: Magnesium: 2.1 mg/dL (ref 1.7–2.4)

## 2022-03-16 LAB — PHOSPHORUS: Phosphorus: 3.3 mg/dL (ref 2.5–4.6)

## 2022-03-16 LAB — VITAMIN B12: Vitamin B-12: 1699 pg/mL — ABNORMAL HIGH (ref 180–914)

## 2022-03-16 NOTE — Plan of Care (Signed)
  Problem: Education: Goal: Knowledge of risk factors and measures for prevention of condition will improve Outcome: Progressing   Problem: Coping: Goal: Psychosocial and spiritual needs will be supported Outcome: Progressing   Problem: Respiratory: Goal: Will maintain a patent airway Outcome: Progressing Goal: Complications related to the disease process, condition or treatment will be avoided or minimized Outcome: Progressing   Problem: Education: Goal: Ability to describe self-care measures that may prevent or decrease complications (Diabetes Survival Skills Education) will improve Outcome: Progressing Goal: Individualized Educational Video(s) Outcome: Progressing

## 2022-03-16 NOTE — Progress Notes (Signed)
PROGRESS NOTE    Penny Miller  JKD:[id number] DOB: 08-Nov-1931 DOA: 03/12/2022 PCP: [name], MD   Brief Narrative:  HPI per Dr. Neva Seat Penny Miller is a 88 y.o. female with medical history significant of spinal stenosis, sinus node dysfunction, diabetes, neuropathy, atrial fibrillation, hypertension, overactive bladder, anemia, asthma, hypothyroidism, CVA, CKD 4 presenting with altered mental status.   History obtained with assistance of patient's granddaughter and chart review due to patient's encephalopathy.  She was last noted to be normal yesterday morning.  Today she was noted to be confused and oriented x 1 only when at baseline she is oriented x 3 and conversant and able to perform ADLs.  She has a history of chronic cystitis and family was noting frequent trips to the bathroom without being able to go.   Patient unable to participate in full review of systems due to altered mentation.   ED Course: Vital signs in the ED significant for blood pressure in the 099I to 338S systolic.  Heart rate in the 50s to 60s.  Lab workup included CMP with creatinine stable at 1.44, glucose 100, protein 5.9, albumin 3.4.  CBC with hemoglobin stable at 10.4, platelets 140.  TSH normal.  Ethanol level negative.  Aspirin and Tylenol levels negative.  Urinalysis normal.  Urine culture pending.  Respiratory panel for flu and COVID positive.  Magnesium normal.  CT head without acute abnormality.  Chest x-ray with stable left greater than right pleural effusion and left basilar airspace disease.  Patient started on molnupiravir, Solu-Medrol, DuoNebs in the ED.     **Interim History She Is slowly improving and her mentation is better.  Continues to feel a little dyspneic and felt dizzy upon standing.  Will check orthostatics in the morning and give her gentle IV fluid hydration today.  Labs relatively stable but D-dimer slightly elevated.  If continues to be elevated tomorrow may pursue CTA of  the chest and lower extremity venous duplex.  OT recommending home health but PT does still evaluate and treat   03/15/2022: PT evaluated and recommending home health still.  She did desaturate on amatory home O2 screen.  CRP minimally went up but will change her steroids from prednisone to IV Solu-Medrol 40 mg twice daily.  She continues to be dyspneic and coughing.  Will repeat chest x-ray in the a.m.  03/16/2022.  Patient states that she is feeling better but still not able to cough up much sputum.  Flutter valve, incentive spirometry has still not been given to the patient yet and she still has not had her repeat orthostatic vital signs and repeat ambulatory home O2 screen.  Assessment and Plan:  Acute encephalopathy, improved COVID-19 infection Mild asthma exacerbation > Patient presenting with altered mental status for the past day or less.  Family had some concern for possible recurrent UTI given her history and frequent trips to the bathroom. > In the ED workup showed normal urinalysis.  No leukocytosis.  Chest x-ray stable but did show chronic pleural effusion and basilar airspace disease.  CT head without acute abnormality either.  No significant electrolyte derangements. > Respiratory screening did come back positive for COVID-19 infection which is presumed etiology of encephalopathy.  She was also noted to be wheezing in the ED and received steroids and breathing treatment.  Also started on molnupiravir. -Continue to monitor on telemetry - Continue daily steroids and was on prednisone 50 mg p.o. daily however will went back to IV Solumedrol 40  mg BID -COVID-19 Labs  Recent Labs    03/14/22 0302 03/15/22 0330 03/16/22 0300  DDIMER 1.16* 1.08*  --   FERRITIN '22 19 19  '$ LDH 202* 219* 185  CRP 0.6 1.2* <0.5    Lab Results  Component Value Date   SARSCOV2NAA POSITIVE (A) 03/12/2022   SARSCOV2NAA NEGATIVE 07/16/2021   [place] NEGATIVE 11/20/2020    -SpO2: 94 % O2 Flow  Rate (L/min): 2 L/min - Scheduled Atrovent - As needed albuterol -Leukopenia is likely from COVID infection -Ordering flutter valve and incentive spirometry as well as guaifenesin; Patient still has NOT received IS and Flutter Valve  - Continue molnupiravir for 5 days total -She will need an ambulatory home O2 screen prior to discharge and she did desaturate yesterday and repet Ambulatory Home O2 screen pending  -Repeat CXR this AM showed "Lungs are adequately inflated with slight worsening hazy opacification over the right middle lobe region and medial right apex. No effusion. Cardiomediastinal silhouette and remainder of the exam is unchanged." -Continue to Trend daily labs and inflammatory markers -Need PT and OT to further evaluate and treat; PT/OT both recommending Home Health   Hypertension - Continue home Amlodipine - Hold home Lasix given her dizziness upon standing we will give her gentle IV fluid hydration -Continue monitor blood pressures per protocol -Last BP reading was elevated at 168/61   Thrombocytopenia  -The patient's platelet count has gone from 133 -> 120 -> 133 -> 124 -Continue to Monitor and Trend and Repeat CBC in the AM    History of CVA - Continue home Atorvastatin - Continue home Eliquis   Dizziness with standing -SHe got dizzy and almost vomited while standing for quite a while.  Given give gentle IV fluid hydration currently and now stopped and check orthostatics which is still pending -checked TSH and was 4.247 -PT evaluation recommending Home Health   Elevated D-dimer -Mild and likely related to COVID -D-Dimer went from 1.16 -> 1.08 and repeat pending  -If he continues to be elevated may obtain a CTA of the chest to evaluate for PE but already anticoagulated on Apixaban    Atrial fibrillation - Continue home Apixaban - Not on rate control or rhythm control medication currently   Neuropathy - Continue Home Gabapentin   Diabetes Mellitus Type 2 -  SSI and his CBGs likely will be elevated in the setting of steroid demargination -CBGs ranging from 134-234   Normocytic/Macrocytic Anemia > Hemoglobin stable in the ED; now hemoglobin/hematocrit is now 10.2/32.1 -> 11.0/34.5 -> 11.5/34.4 with an MCV of 98.0 -Anemia panel was checked and showed an iron level of 65, UIBC of 277, TIBC of 342, saturation ratios of 90%, ferritin of 19, folate of 8.3, CRP less than 0.5 and a vitamin B12 of 1699 -Continue to Monitor for S/Sx of Bleeding; No overt bleeding noted  -Continue to trend CBC -Continue with home zinc and thiamine   Hypoalbuminemia -Patient's albumin level is now 2.6 -> 2.8 -> 3.1 -Continue monitor and trend and repeat CMP in a.m.   CKD 3B > Creatinine stable at 1.44 in the ED. Now BUNs/creatinine has gone from 17/1.27 -> 26/1.33 -> 35/1.47 -> 42/1.33 -Avoid further nephrotoxic medications, contrast dyes, hypotension and dehydration ensure adequate renal perfusion -Will stop gentle IV for hydration at 75 MLS per hour -continue monitor and trend renal function carefully and repeat CMP in the a.m.   Asymptomatic Bacteruria -UA not really convincing for UTI and Not Symptomatic  -Urine Cx done and  showed:   ulture  Abnormal  >=100,000 COLONIES/mL DIPHTHEROIDS(CORYNEBACTERIUM SPECIES) Standardized susceptibility testing for this organism is not available. 20,000 COLONIES/mL ENTEROCOCCUS FAECALIS   Report Status 03/15/2022 FINAL  Organism ID, Bacteria ENTEROCOCCUS FAECALIS Abnormal   Resulting Agency [facility]      Susceptibility           Enterococcus faecalis      MIC      AMPICILLIN <=2 SENSITIVE Sensitive      NITROFURANTOIN <=16 SENSIT... Sensitive      VANCOMYCIN 2 SENSITIVE Sensitive            DVT prophylaxis: apixaban (ELIQUIS) tablet 2.5 mg Start: 03/12/22 2200 apixaban (ELIQUIS) tablet 2.5 mg    Code Status: DNR Family Communication: No family currently at bedside   Disposition Plan:  Level of care:  Telemetry Medical Status is: Inpatient Remains inpatient appropriate because: Needs further clinical improvement and needs a orthostatic vital signs checked as well as repeat amatory screen which is still pending.   Consultants:  None  Procedures:  As delineated as above  Antimicrobials:  Anti-infectives (From admission, onward)    Start     Dose/Rate Route Frequency Ordered Stop   03/12/22 1900  molnupiravir EUA (LAGEVRIO) capsule 800 mg        4 capsule Oral 2 times daily 03/12/22 1800 03/17/22 2159       Subjective: Seen and examined at bedside and she was resting in the bed eating.  States that she has not been out of bed yet.  Denies any nausea or vomiting.  Feels okay.  Thinks her breathing is doing better but not able to cough up sputum.  No other concerns or complaints at this time.  Objective: Vitals:   03/16/22 0400 03/16/22 0600 03/16/22 0800 03/16/22 1155  BP: (!) 156/81  (!) 153/137 (!) 168/61  Pulse: (!) 50 (!) 50 (!) 59 (!) 46  Resp: '13 16 18 16  '$ Temp:   97.8 F (36.6 C) 98.6 F (37 C)  TempSrc:   Oral Oral  SpO2: 99% 95% 94%   Weight:      Height:       No intake or output data in the 24 hours ending 03/16/22 1547 Filed Weights   03/12/22 2134  Weight: 69.9 kg   Examination: Physical Exam:  Constitutional: WN/WD elderly Caucasian female currently in no acute distress Respiratory: Diminished to auscultation bilaterally with coarse breath sounds and has some slight rhonchi.  No appreciable wheezing, rales or crackles.  Has normal respiratory effort and she is wearing supplemental oxygen via nasal cannula Cardiovascular: RRR, no murmurs / rubs / gallops. S1 and S2 auscultated.  Trace extremity edema Abdomen: Soft, non-tender, non-distended. Bowel sounds positive.  GU: Deferred. Musculoskeletal: No clubbing / cyanosis of digits/nails. No joint deformity upper and lower extremities.  Skin: No rashes, lesions, ulcers on limited skin evaluation. No  induration; Warm and dry.  Neurologic: CN 2-12 grossly intact with no focal deficits. Romberg sign and cerebellar reflexes not assessed.  Psychiatric: Normal judgment and insight. Alert and oriented x 3. Normal mood and appropriate affect.   Data Reviewed: I have personally reviewed following labs and imaging studies  CBC: Recent Labs  Lab 03/12/22 1325 03/13/22 0449 03/14/22 0302 03/15/22 0330 03/16/22 0300  WBC 4.0 3.8* 6.1 6.3 5.7  NEUTROABS 2.6 3.1 4.3 4.8 4.7  HGB 10.4* 11.7* 10.2* 11.0* 11.5*  HCT 32.0* 35.0* 32.1* 34.5* 34.4*  MCV 100.3* 99.4 102.2* 100.6* 98.0  PLT  140* 133* 120* 133* 564*   Basic Metabolic Panel: Recent Labs  Lab 03/12/22 1325 03/13/22 0449 03/14/22 0302 03/15/22 0330 03/16/22 0300  NA 137 136 139 135 134*  K 3.8 3.7 3.2* 4.5 4.6  CL 102 100 105 99 98  CO2 '27 25 28 28 27  '$ GLUCOSE 100* 160* 107* 133* 218*  BUN 17 17 26* 35* 42*  CREATININE 1.44* 1.27* 1.33* 1.47* 1.33*  CALCIUM 9.8 9.7 8.2* 8.6* 8.8*  MG 1.9  --  1.8 2.1 2.1  PHOS  --   --  4.0 3.6 3.3   GFR: Estimated Creatinine Clearance: 27.4 mL/min (A) (by C-G formula based on SCr of 1.33 mg/dL (H)). Liver Function Tests: Recent Labs  Lab 03/12/22 1325 03/13/22 0449 03/14/22 0302 03/15/22 0330 03/16/22 0300  AST '20 26 21 21 20  '$ ALT '11 16 10 13 14  '$ ALKPHOS 63 66 51 61 61  BILITOT 0.3 0.4 0.3 0.4 0.5  PROT 5.9* 6.0* 4.9* 5.2* 5.4*  ALBUMIN 3.4* 3.4* 2.6* 2.8* 3.1*   No results for input(s): "LIPASE", "AMYLASE" in the last 168 hours. No results for input(s): "AMMONIA" in the last 168 hours. Coagulation Profile: No results for input(s): "INR", "PROTIME" in the last 168 hours. Cardiac Enzymes: No results for input(s): "CKTOTAL", "CKMB", "CKMBINDEX", "TROPONINI" in the last 168 hours. BNP (last 3 results) Recent Labs    02/20/22 1150 03/04/22 0958  PROBNP 1,012.0* 863.0*   HbA1C: No results for input(s): "HGBA1C" in the last 72 hours. CBG: Recent Labs  Lab 03/15/22 1247  03/15/22 1614 03/15/22 2148 03/16/22 0842 03/16/22 1219  GLUCAP 134* 202* 195* 234* 201*   Lipid Profile: No results for input(s): "CHOL", "HDL", "LDLCALC", "TRIG", "CHOLHDL", "LDLDIRECT" in the last 72 hours. Thyroid Function Tests: No results for input(s): "TSH", "T4TOTAL", "FREET4", "T3FREE", "THYROIDAB" in the last 72 hours. Anemia Panel: Recent Labs    03/15/22 0330 03/16/22 0300  VITAMINB12  --  1,699*  FOLATE  --  8.3  FERRITIN 19 19  TIBC  --  342  IRON  --  65  RETICCTPCT  --  1.9   Sepsis Labs: No results for input(s): "PROCALCITON", "LATICACIDVEN" in the last 168 hours.  Recent Results (from the past 240 hour(s))  Resp panel by RT-PCR (RSV, Flu A&B, Covid) Anterior Nasal Swab     Status: Abnormal   Collection Time: 03/12/22  2:47 PM   Specimen: Anterior Nasal Swab  Result Value Ref Range Status   SARS Coronavirus 2 by RT PCR POSITIVE (A) NEGATIVE Final    Comment: (NOTE) SARS-CoV-2 target nucleic acids are DETECTED.  The SARS-CoV-2 RNA is generally detectable in upper respiratory specimens during the acute phase of infection. Positive results are indicative of the presence of the identified virus, but do not rule out bacterial infection or co-infection with other pathogens not detected by the test. Clinical correlation with patient history and other diagnostic information is necessary to determine patient infection status. The expected result is Negative.  Fact Sheet for Patients: EntrepreneurPulse.com.au  Fact Sheet for Healthcare Providers: IncredibleEmployment.be  This test is not yet approved or cleared by the [place] FDA and  has been authorized for detection and/or diagnosis of SARS-CoV-2 by FDA under an Emergency Use Authorization (EUA).  This EUA will remain in effect (meaning this test can be used) for the duration of  the COVID-19 declaration under Section 564(b)(1) of the A ct, 21 U.S.C. section  360bbb-3(b)(1), unless the authorization is terminated or revoked sooner.  Influenza A by PCR NEGATIVE NEGATIVE Final   Influenza B by PCR NEGATIVE NEGATIVE Final    Comment: (NOTE) The Xpert Xpress SARS-CoV-2/FLU/RSV plus assay is intended as an aid in the diagnosis of influenza from Nasopharyngeal swab specimens and should not be used as a sole basis for treatment. Nasal washings and aspirates are unacceptable for Xpert Xpress SARS-CoV-2/FLU/RSV testing.  Fact Sheet for Patients: EntrepreneurPulse.com.au  Fact Sheet for Healthcare Providers: IncredibleEmployment.be  This test is not yet approved or cleared by the [place] FDA and has been authorized for detection and/or diagnosis of SARS-CoV-2 by FDA under an Emergency Use Authorization (EUA). This EUA will remain in effect (meaning this test can be used) for the duration of the COVID-19 declaration under Section 564(b)(1) of the Act, 21 U.S.C. section 360bbb-3(b)(1), unless the authorization is terminated or revoked.     Resp Syncytial Virus by PCR NEGATIVE NEGATIVE Final    Comment: (NOTE) Fact Sheet for Patients: EntrepreneurPulse.com.au  Fact Sheet for Healthcare Providers: IncredibleEmployment.be  This test is not yet approved or cleared by the [place] FDA and has been authorized for detection and/or diagnosis of SARS-CoV-2 by FDA under an Emergency Use Authorization (EUA). This EUA will remain in effect (meaning this test can be used) for the duration of the COVID-19 declaration under Section 564(b)(1) of the Act, 21 U.S.C. section 360bbb-3(b)(1), unless the authorization is terminated or revoked.  Performed at [facility], [place] [address], [place], [place] [address]   Urine Culture     Status: Abnormal   Collection Time: 03/12/22  4:48 PM   Specimen: In/Out Cath Urine  Result Value Ref Range Status   Specimen  Description IN/OUT CATH URINE  Final   Special Requests   Final    NONE Performed at [facility], [place] [address] [place], [place] [address]    Culture (A)  Final    >=100,000 COLONIES/mL DIPHTHEROIDS(CORYNEBACTERIUM SPECIES) Standardized susceptibility testing for this organism is not available. 20,000 COLONIES/mL ENTEROCOCCUS FAECALIS    Report Status 03/15/2022 FINAL  Final   Organism ID, Bacteria ENTEROCOCCUS FAECALIS (A)  Final      Susceptibility   Enterococcus faecalis - MIC*    AMPICILLIN <=2 SENSITIVE Sensitive     NITROFURANTOIN <=16 SENSITIVE Sensitive     VANCOMYCIN 2 SENSITIVE Sensitive     * 20,000 COLONIES/mL ENTEROCOCCUS FAECALIS    Radiology Studies: DG CHEST PORT 1 VIEW  Result Date: 03/16/2022 CLINICAL DATA:  Shortness of breath.  COVID-19 positive EXAM: PORTABLE CHEST 1 VIEW COMPARISON:  03/15/2022 FINDINGS: Lungs are adequately inflated with slight worsening hazy opacification over the right middle lobe region and medial right apex. No effusion. Cardiomediastinal silhouette and remainder of the exam is unchanged. IMPRESSION: Slight worsening hazy opacification over the right middle lobe region and medial right apex likely multifocal infection. Electronically Signed   By: Marin Olp M.D.   On: 03/16/2022 10:20   DG CHEST PORT 1 VIEW  Result Date: 03/15/2022 CLINICAL DATA:  COVID. EXAM: PORTABLE CHEST 1 VIEW COMPARISON:  Chest x-ray from yesterday. FINDINGS: Unchanged mild cardiomegaly. Loop recorder again noted. Unchanged small left pleural effusion with left basilar atelectasis. The lungs are otherwise clear. No pneumothorax. No acute osseous abnormality. IMPRESSION: 1. Unchanged small left pleural effusion with left basilar atelectasis. Electronically Signed   By: Titus Dubin M.D.   On: 03/15/2022 08:11    Scheduled Meds:  amLODipine  2.5 mg Oral QHS   apixaban  2.5 mg  Oral BID   atorvastatin  40 mg Oral QHS   gabapentin  300 mg Oral TID    guaiFENesin  1,200 mg Oral BID   insulin aspart  0-9 Units Subcutaneous TID WC   ipratropium  0.5 mg Nebulization BID   methylPREDNISolone (SOLU-MEDROL) injection  40 mg Intravenous Q12H   molnupiravir EUA  4 capsule Oral BID   mometasone-formoterol  2 puff Inhalation BID   sodium chloride flush  3 mL Intravenous Q12H   thiamine  50 mg Oral Daily   zinc sulfate  220 mg Oral Daily   Continuous Infusions:   LOS: 3 days   Raiford Noble, DO [facility] Available via Epic secure chat 7am-7pm After these hours, please refer to coverage provider listed on [email or web address] 03/16/2022, 3:47 PM

## 2022-03-16 NOTE — Plan of Care (Signed)
  Problem: Education: Goal: Knowledge of risk factors and measures for prevention of condition will improve Outcome: Progressing   Problem: Coping: Goal: Psychosocial and spiritual needs will be supported Outcome: Progressing   Problem: Respiratory: Goal: Will maintain a patent airway Outcome: Progressing Goal: Complications related to the disease process, condition or treatment will be avoided or minimized Outcome: Progressing   Problem: Education: Goal: Ability to describe self-care measures that may prevent or decrease complications (Diabetes Survival Skills Education) will improve Outcome: Progressing Goal: Individualized Educational Video(s) Outcome: Progressing   Problem: Coping: Goal: Ability to adjust to condition or change in health will improve Outcome: Progressing   Problem: Fluid Volume: Goal: Ability to maintain a balanced intake and output will improve Outcome: Progressing   Problem: Health Behavior/Discharge Planning: Goal: Ability to identify and utilize available resources and services will improve Outcome: Progressing Goal: Ability to manage health-related needs will improve Outcome: Progressing   Problem: Metabolic: Goal: Ability to maintain appropriate glucose levels will improve Outcome: Progressing   Problem: Nutritional: Goal: Maintenance of adequate nutrition will improve Outcome: Progressing Goal: Progress toward achieving an optimal weight will improve Outcome: Progressing   Problem: Skin Integrity: Goal: Risk for impaired skin integrity will decrease Outcome: Progressing   Problem: Tissue Perfusion: Goal: Adequacy of tissue perfusion will improve Outcome: Progressing   Problem: Education: Goal: Knowledge of General Education information will improve Description: Including pain rating scale, medication(s)/side effects and non-pharmacologic comfort measures Outcome: Progressing   Problem: Health Behavior/Discharge Planning: Goal:  Ability to manage health-related needs will improve Outcome: Progressing   Problem: Clinical Measurements: Goal: Ability to maintain clinical measurements within normal limits will improve Outcome: Progressing Goal: Will remain free from infection Outcome: Progressing Goal: Diagnostic test results will improve Outcome: Progressing Goal: Respiratory complications will improve Outcome: Progressing Goal: Cardiovascular complication will be avoided Outcome: Progressing   Problem: Activity: Goal: Risk for activity intolerance will decrease Outcome: Progressing   Problem: Nutrition: Goal: Adequate nutrition will be maintained Outcome: Progressing   Problem: Coping: Goal: Level of anxiety will decrease Outcome: Progressing   Problem: Elimination: Goal: Will not experience complications related to bowel motility Outcome: Progressing Goal: Will not experience complications related to urinary retention Outcome: Progressing   Problem: Pain Managment: Goal: General experience of comfort will improve Outcome: Progressing   Problem: Safety: Goal: Ability to remain free from injury will improve Outcome: Progressing   Problem: Skin Integrity: Goal: Risk for impaired skin integrity will decrease Outcome: Progressing

## 2022-03-16 NOTE — Plan of Care (Signed)
  Problem: Education: Goal: Knowledge of risk factors and measures for prevention of condition will improve Outcome: Progressing   Problem: Coping: Goal: Psychosocial and spiritual needs will be supported Outcome: Progressing   Problem: Respiratory: Goal: Will maintain a patent airway Outcome: Progressing Goal: Complications related to the disease process, condition or treatment will be avoided or minimized Outcome: Progressing   

## 2022-03-16 NOTE — Progress Notes (Signed)
SATURATION QUALIFICATIONS: (This note is used to comply with regulatory documentation for home oxygen)  Patient Saturations on oxygen at 2l at Rest = 96%  Patient Saturations on oxygen at 2l while Ambulating = 84%  Patient Saturations on 3 Liters of oxygen while Ambulating = 92%

## 2022-03-17 ENCOUNTER — Inpatient Hospital Stay (HOSPITAL_COMMUNITY): Payer: Medicare Other

## 2022-03-17 DIAGNOSIS — G9340 Encephalopathy, unspecified: Secondary | ICD-10-CM | POA: Diagnosis not present

## 2022-03-17 DIAGNOSIS — E1142 Type 2 diabetes mellitus with diabetic polyneuropathy: Secondary | ICD-10-CM | POA: Diagnosis not present

## 2022-03-17 DIAGNOSIS — E039 Hypothyroidism, unspecified: Secondary | ICD-10-CM | POA: Diagnosis not present

## 2022-03-17 DIAGNOSIS — U071 COVID-19: Secondary | ICD-10-CM | POA: Diagnosis not present

## 2022-03-17 LAB — COMPREHENSIVE METABOLIC PANEL
ALT: 13 U/L (ref 0–44)
AST: 16 U/L (ref 15–41)
Albumin: 2.8 g/dL — ABNORMAL LOW (ref 3.5–5.0)
Alkaline Phosphatase: 57 U/L (ref 38–126)
Anion gap: 5 (ref 5–15)
BUN: 39 mg/dL — ABNORMAL HIGH (ref 8–23)
CO2: 30 mmol/L (ref 22–32)
Calcium: 8.9 mg/dL (ref 8.9–10.3)
Chloride: 100 mmol/L (ref 98–111)
Creatinine, Ser: 1.25 mg/dL — ABNORMAL HIGH (ref 0.44–1.00)
GFR, Estimated: 41 mL/min — ABNORMAL LOW (ref >60)
Glucose, Bld: 147 mg/dL — ABNORMAL HIGH (ref 70–99)
Potassium: 4.7 mmol/L (ref 3.5–5.1)
Sodium: 135 mmol/L (ref 135–145)
Total Bilirubin: 0.4 mg/dL (ref 0.3–1.2)
Total Protein: 5.2 g/dL — ABNORMAL LOW (ref 6.5–8.1)

## 2022-03-17 LAB — CBC WITH DIFFERENTIAL/PLATELET
Abs Immature Granulocytes: 0.04 10*3/uL (ref 0.00–0.07)
Basophils Absolute: 0 10*3/uL (ref 0.0–0.1)
Basophils Relative: 0 %
Eosinophils Absolute: 0 10*3/uL (ref 0.0–0.5)
Eosinophils Relative: 0 %
HCT: 35.5 % — ABNORMAL LOW (ref 36.0–46.0)
Hemoglobin: 11.7 g/dL — ABNORMAL LOW (ref 12.0–15.0)
Immature Granulocytes: 1 %
Lymphocytes Relative: 12 %
Lymphs Abs: 0.9 10*3/uL (ref 0.7–4.0)
MCH: 32.1 pg (ref 26.0–34.0)
MCHC: 33 g/dL (ref 30.0–36.0)
MCV: 97.3 fL (ref 80.0–100.0)
Monocytes Absolute: 0.6 10*3/uL (ref 0.1–1.0)
Monocytes Relative: 8 %
Neutro Abs: 6 10*3/uL (ref 1.7–7.7)
Neutrophils Relative %: 79 %
Platelets: 123 10*3/uL — ABNORMAL LOW (ref 150–400)
RBC: 3.65 MIL/uL — ABNORMAL LOW (ref 3.87–5.11)
RDW: 12.3 % (ref 11.5–15.5)
WBC: 7.6 10*3/uL (ref 4.0–10.5)
nRBC: 0 % (ref 0.0–0.2)

## 2022-03-17 LAB — GLUCOSE, CAPILLARY
Glucose-Capillary: 172 mg/dL — ABNORMAL HIGH (ref 70–99)
Glucose-Capillary: 265 mg/dL — ABNORMAL HIGH (ref 70–99)
Glucose-Capillary: 229 mg/dL — ABNORMAL HIGH (ref 70–99)
Glucose-Capillary: 217 mg/dL — ABNORMAL HIGH (ref 70–99)

## 2022-03-17 LAB — FIBRINOGEN: Fibrinogen: 292 mg/dL (ref 210–475)

## 2022-03-17 LAB — D-DIMER, QUANTITATIVE: D-Dimer, Quant: 1.49 ug/mL-FEU — ABNORMAL HIGH (ref 0.00–0.50)

## 2022-03-17 LAB — FERRITIN: Ferritin: 21 ng/mL (ref 11–307)

## 2022-03-17 LAB — PHOSPHORUS: Phosphorus: 3.3 mg/dL (ref 2.5–4.6)

## 2022-03-17 LAB — SEDIMENTATION RATE: Sed Rate: 3 mm/hr (ref 0–22)

## 2022-03-17 LAB — LACTATE DEHYDROGENASE: LDH: 176 U/L (ref 98–192)

## 2022-03-17 LAB — C-REACTIVE PROTEIN: CRP: 0.5 mg/dL (ref <1.0)

## 2022-03-17 LAB — MAGNESIUM: Magnesium: 2.1 mg/dL (ref 1.7–2.4)

## 2022-03-17 MED ORDER — SODIUM CHLORIDE 0.9 % IV BOLUS
500.0000 mL | Freq: Once | INTRAVENOUS | Status: AC
  Administered 2022-03-17: 500 mL via INTRAVENOUS

## 2022-03-17 MED ORDER — INSULIN ASPART 100 UNIT/ML IJ SOLN
4.0000 [IU] | Freq: Three times a day (TID) | INTRAMUSCULAR | Status: DC
  Administered 2022-03-17 – 2022-03-21 (×8): 4 [IU] via SUBCUTANEOUS

## 2022-03-17 NOTE — Progress Notes (Signed)
Tried ambulating pt in the room but she complained of dizziness as soon as she started walking BP dropped from 138/117 to 127/65 within around 1 min so pt was returned back to bed

## 2022-03-17 NOTE — Progress Notes (Signed)
Tried ambulating the patient she tells she does not feel good to stand up for now.Will try again.

## 2022-03-17 NOTE — Progress Notes (Signed)
PROGRESS NOTE    EVIANNA CHANDRAN  ZOX:[id number] DOB: [birth year] DOA: 03/12/2022 PCP: [name], MD   Brief Narrative:  HPI per Dr. Neva Seat SRINIDHI LANDERS is a 88 y.o. female with medical history significant of spinal stenosis, sinus node dysfunction, diabetes, neuropathy, atrial fibrillation, hypertension, overactive bladder, anemia, asthma, hypothyroidism, CVA, CKD 4 presenting with altered mental status.   History obtained with assistance of patient's granddaughter and chart review due to patient's encephalopathy.  She was last noted to be normal yesterday morning.  Today she was noted to be confused and oriented x 1 only when at baseline she is oriented x 3 and conversant and able to perform ADLs.  She has a history of chronic cystitis and family was noting frequent trips to the bathroom without being able to go.   Patient unable to participate in full review of systems due to altered mentation.   ED Course: Vital signs in the ED significant for blood pressure in the 811B to 147W systolic.  Heart rate in the 50s to 60s.  Lab workup included CMP with creatinine stable at 1.44, glucose 100, protein 5.9, albumin 3.4.  CBC with hemoglobin stable at 10.4, platelets 140.  TSH normal.  Ethanol level negative.  Aspirin and Tylenol levels negative.  Urinalysis normal.  Urine culture pending.  Respiratory panel for flu and COVID positive.  Magnesium normal.  CT head without acute abnormality.  Chest x-ray with stable left greater than right pleural effusion and left basilar airspace disease.  Patient started on molnupiravir, Solu-Medrol, DuoNebs in the ED.     **Interim History She Is slowly improving and her mentation is better.  Continues to feel a little dyspneic and felt dizzy upon standing.  Will check orthostatics in the morning and give her gentle IV fluid hydration today.  Labs relatively stable but D-dimer slightly elevated.  If continues to be elevated tomorrow may pursue CTA of  the chest and lower extremity venous duplex.  OT recommending home health but PT does still evaluate and treat   03/15/2022: PT evaluated and recommending home health still.  She did desaturate on amatory home O2 screen.  CRP minimally went up but will change her steroids from prednisone to IV Solu-Medrol 40 mg twice daily.  She continues to be dyspneic and coughing.  Will repeat chest x-ray in the a.m.   03/16/2022.  Patient states that she is feeling better but still not able to cough up much sputum.  Flutter valve, incentive spirometry has still not been given to the patient yet and she still has not had her repeat orthostatic vital signs and repeat ambulatory home O2 screen.  03/17/22: Patient was dizzy this morning.  Respiratory status seems stable.  Inflammatory markers are trending down.  Other than her dizziness she states that she is feeling a little bit better.  She now reports that her son and her granddaughter have COVID and that she got a cold from her son.   Assessment and Plan:  Acute encephalopathy, improved COVID-19 infection Mild asthma exacerbation > Patient presenting with altered mental status for the past day or less.  Family had some concern for possible recurrent UTI given her history and frequent trips to the bathroom. > In the ED workup showed normal urinalysis.  No leukocytosis.  Chest x-ray stable but did show chronic pleural effusion and basilar airspace disease.  CT head without acute abnormality either.  No significant electrolyte derangements. > Respiratory screening did come back positive  for COVID-19 infection which is presumed etiology of encephalopathy.  She was also noted to be wheezing in the ED and received steroids and breathing treatment.  Also started on molnupiravir. -Continue to monitor on telemetry - Continue daily steroids and was on prednisone 50 mg p.o. daily however will went back to IV Solumedrol 40 mg BID and will now start weaning  -Inflammatory  Marker Trend: Recent Labs    03/15/22 0330 03/16/22 0300 03/17/22 0252  DDIMER 1.08*  --  1.49*  FERRITIN '19 19 21  '$ LDH 219* 185 176  CRP 1.2* <0.5 0.5   Lab Results  Component Value Date   SARSCOV2NAA POSITIVE (A) 03/12/2022   [place] NEGATIVE 07/16/2021   [place] NEGATIVE 11/20/2020    -SpO2: 92 % O2 Flow Rate (L/min): 3 L/min - As needed albuterol -Leukopenia is likely from COVID infection -Ordering flutter valve and incentive spirometry as well as guaifenesin; Patient still has NOT received IS and Flutter Valve  - Continue molnupiravir for 5 days total -She will need an ambulatory home O2 screen prior to discharge and she did desaturate yesterday and repet Ambulatory Home O2 screen pending  -Repeat CXR this AM showed "Developing right middle lobe and left retrocardiac infiltrates or atelectasis." -Continue to Trend daily labs and inflammatory markers -Need PT and OT to further evaluate and treat; PT/OT both recommending Home Health   Hypertension - Continue home Amlodipine - Hold home Lasix given her dizziness upon standing we will give her gentle IV fluid hydration -Continue monitor blood pressures per protocol -Last BP reading was elevated at 158/73   Thrombocytopenia  -The patient's platelet count has gone from 133 -> 120 -> 133 -> 124 -> 123 -Continue to Monitor and Trend and Repeat CBC in the AM    History of CVA - Continue home Atorvastatin - Continue home Eliquis   Dizziness with standing, persistent  -SHe got dizzy and almost vomited while standing for quite a while.  Given gentle IV fluid hydration but stopped and will give a 500 ML Bolus and check orthostatics again -checked TSH and was 4.247 -TED Hose  -PT evaluation recommending Home Health   Elevated D-dimer -Mild and likely related to COVID -D-Dimer went from 1.16 -> 1.08 and repeat is now 1.49  -If he continues to be elevated may obtain a CTA of the chest to evaluate for PE but already  anticoagulated on Apixaban    Atrial fibrillation - Continue home Apixaban - Not on rate control or rhythm control medication currently   Neuropathy - Continue Home Gabapentin   Diabetes Mellitus Type 2 - SSI and his CBGs likely will be elevated in the setting of steroid demargination -CBGs ranging from 172-265 -C/w Sensitive Novolog SS AC and will add 4 units    Normocytic/Macrocytic Anemia > Hemoglobin stable in the ED; now hemoglobin/hematocrit is now 10.2/32.1 -> 11.0/34.5 -> 11.5/34.4 -> 11.7/35.5 with an MCV of 97.3 -Anemia panel was checked and showed an iron level of 65, UIBC of 277, TIBC of 342, saturation ratios of 90%, ferritin of 19, folate of 8.3, CRP less than 0.5 and a vitamin B12 of 1699 -Continue to Monitor for S/Sx of Bleeding; No overt bleeding noted  -Continue to trend CBC -Continue with home Zinc and Thiamine   Hypoalbuminemia -Patient's albumin level is now 2.6 -> 2.8 -> 3.1 -> 2.8 -Continue monitor and trend and repeat CMP in a.m.   CKD 3B > Creatinine stable at 1.44 in the ED. Now BUNs/creatinine has gone  from 17/1.27 -> 26/1.33 -> 35/1.47 -> 42/1.33 -> 39/1.25 -Avoid further nephrotoxic medications, contrast dyes, hypotension and dehydration ensure adequate renal perfusion -Will give her a 500 mL Bolus given her dizziness -continue monitor and trend renal function carefully and repeat CMP in the a.m.   Asymptomatic Bacteruria -UA not really convincing for UTI and Not Symptomatic  -Urine Cx done and showed:   ulture  Abnormal  >=100,000 COLONIES/mL DIPHTHEROIDS(CORYNEBACTERIUM SPECIES) Standardized susceptibility testing for this organism is not available. 20,000 COLONIES/mL ENTEROCOCCUS FAECALIS   Report Status 03/15/2022 FINAL  Organism ID, Bacteria ENTEROCOCCUS FAECALIS Abnormal   Resulting Agency [facility]      Susceptibility                Enterococcus faecalis      MIC      AMPICILLIN <=2 SENSITIVE Sensitive      NITROFURANTOIN <=16  SENSIT... Sensitive      VANCOMYCIN 2 SENSITIVE Sensitive             DVT prophylaxis: Place TED hose Start: 03/17/22 1618 apixaban (ELIQUIS) tablet 2.5 mg Start: 03/12/22 2200 apixaban (ELIQUIS) tablet 2.5 mg    Code Status: DNR Family Communication: Discussed with son over the telephone  Disposition Plan:  Level of care: Telemetry Medical Status is: Inpatient Remains inpatient appropriate because: Continues to be dizzy and will give her some more fluids.  Respiratory status is stable but she does require some supplemental oxygen especially when ambulating   Consultants:  None  Procedures:  As delineated as above  Antimicrobials:  Anti-infectives (From admission, onward)    Start     Dose/Rate Route Frequency Ordered Stop   03/12/22 1900  molnupiravir EUA (LAGEVRIO) capsule 800 mg        4 capsule Oral 2 times daily 03/12/22 1800 03/17/22 0816       Subjective: Seen and examined at bedside think she is doing little bit better but was dizzy this morning especially upon standing.  Had to be put back into bed.  No nausea or vomiting.  Thinks her shortness of breath is improving.  States that she is able to cough up some more sputum but states that some of it is "stuck".  No other concerns or complaints at this time.  Objective: Vitals:   03/17/22 0800 03/17/22 1200 03/17/22 1410 03/17/22 1510  BP: (!) 163/87 (!) 138/110  (!) 158/73  Pulse:  (!) 57  60  Resp:  15  16  Temp:  97.8 F (36.6 C)  98 F (36.7 C)  TempSrc:      SpO2:  92% 92% 92%  Weight:      Height:        Intake/Output Summary (Last 24 hours) at 03/17/2022 1648 Last data filed at 03/17/2022 1517 Gross per 24 hour  Intake 0.5 ml  Output 801 ml  Net -800.5 ml   Filed Weights   03/12/22 2134  Weight: 69.9 kg   Examination: Physical Exam:  Constitutional: WN/WD elderly Caucasian female currently no acute distress Respiratory: Diminished to auscultation bilaterally with coarse breath sounds and  has some slight rhonchi but no appreciable wheezing, rales, crackles.  Has a normal respiratory effort and she is wearing supplemental oxygen via nasal cannula Cardiovascular: RRR, no murmurs / rubs / gallops. S1 and S2 auscultated.  Mild extremity edema.  Abdomen: Soft, non-tender, non-distended. Bowel sounds positive.  GU: Deferred. Musculoskeletal: No clubbing / cyanosis of digits/nails. No joint deformity upper and lower extremities.  Skin: No rashes, lesions, ulcers on limited skin evaluation. No induration; Warm and dry.  Neurologic: CN 2-12 grossly intact with no focal deficits. Romberg sign and cerebellar reflexes not assessed.  Psychiatric: Normal judgment and insight. Alert and oriented x 3. Normal mood and appropriate affect.   Data Reviewed: I have personally reviewed following labs and imaging studies  CBC: Recent Labs  Lab 03/13/22 0449 03/14/22 0302 03/15/22 0330 03/16/22 0300 03/17/22 0252  WBC 3.8* 6.1 6.3 5.7 7.6  NEUTROABS 3.1 4.3 4.8 4.7 6.0  HGB 11.7* 10.2* 11.0* 11.5* 11.7*  HCT 35.0* 32.1* 34.5* 34.4* 35.5*  MCV 99.4 102.2* 100.6* 98.0 97.3  PLT 133* 120* 133* 124* 809*   Basic Metabolic Panel: Recent Labs  Lab 03/12/22 1325 03/13/22 0449 03/14/22 0302 03/15/22 0330 03/16/22 0300 03/17/22 0252  NA 137 136 139 135 134* 135  K 3.8 3.7 3.2* 4.5 4.6 4.7  CL 102 100 105 99 98 100  CO2 '27 25 28 28 27 30  '$ GLUCOSE 100* 160* 107* 133* 218* 147*  BUN 17 17 26* 35* 42* 39*  CREATININE 1.44* 1.27* 1.33* 1.47* 1.33* 1.25*  CALCIUM 9.8 9.7 8.2* 8.6* 8.8* 8.9  MG 1.9  --  1.8 2.1 2.1 2.1  PHOS  --   --  4.0 3.6 3.3 3.3   GFR: Estimated Creatinine Clearance: 29.1 mL/min (A) (by C-G formula based on SCr of 1.25 mg/dL (H)). Liver Function Tests: Recent Labs  Lab 03/13/22 0449 03/14/22 0302 03/15/22 0330 03/16/22 0300 03/17/22 0252  AST '26 21 21 20 16  '$ ALT '16 10 13 14 13  '$ ALKPHOS 66 51 61 61 57  BILITOT 0.4 0.3 0.4 0.5 0.4  PROT 6.0* 4.9* 5.2* 5.4* 5.2*   ALBUMIN 3.4* 2.6* 2.8* 3.1* 2.8*   No results for input(s): "LIPASE", "AMYLASE" in the last 168 hours. No results for input(s): "AMMONIA" in the last 168 hours. Coagulation Profile: No results for input(s): "INR", "PROTIME" in the last 168 hours. Cardiac Enzymes: No results for input(s): "CKTOTAL", "CKMB", "CKMBINDEX", "TROPONINI" in the last 168 hours. BNP (last 3 results) Recent Labs    02/20/22 1150 03/04/22 0958  PROBNP 1,012.0* 863.0*   HbA1C: No results for input(s): "HGBA1C" in the last 72 hours. CBG: Recent Labs  Lab 03/16/22 0842 03/16/22 1219 03/16/22 1637 03/17/22 0747 03/17/22 1222  GLUCAP 234* 201* 146* 172* 265*   Lipid Profile: No results for input(s): "CHOL", "HDL", "LDLCALC", "TRIG", "CHOLHDL", "LDLDIRECT" in the last 72 hours. Thyroid Function Tests: No results for input(s): "TSH", "T4TOTAL", "FREET4", "T3FREE", "THYROIDAB" in the last 72 hours. Anemia Panel: Recent Labs    03/16/22 0300 03/17/22 0252  VITAMINB12 1,699*  --   FOLATE 8.3  --   FERRITIN 19 21  TIBC 342  --   IRON 65  --   RETICCTPCT 1.9  --    Sepsis Labs: No results for input(s): "PROCALCITON", "LATICACIDVEN" in the last 168 hours.  Recent Results (from the past 240 hour(s))  Resp panel by RT-PCR (RSV, Flu A&B, Covid) Anterior Nasal Swab     Status: Abnormal   Collection Time: 03/12/22  2:47 PM   Specimen: Anterior Nasal Swab  Result Value Ref Range Status   SARS Coronavirus 2 by RT PCR POSITIVE (A) NEGATIVE Final    Comment: (NOTE) SARS-CoV-2 target nucleic acids are DETECTED.  The SARS-CoV-2 RNA is generally detectable in upper respiratory specimens during the acute phase of infection. Positive results are indicative of the presence of the identified virus,  but do not rule out bacterial infection or co-infection with other pathogens not detected by the test. Clinical correlation with patient history and other diagnostic information is necessary to determine  patient infection status. The expected result is Negative.  Fact Sheet for Patients: EntrepreneurPulse.com.au  Fact Sheet for Healthcare Providers: IncredibleEmployment.be  This test is not yet approved or cleared by the [place] FDA and  has been authorized for detection and/or diagnosis of SARS-CoV-2 by FDA under an Emergency Use Authorization (EUA).  This EUA will remain in effect (meaning this test can be used) for the duration of  the COVID-19 declaration under Section 564(b)(1) of the A ct, 21 U.S.C. section 360bbb-3(b)(1), unless the authorization is terminated or revoked sooner.     Influenza A by PCR NEGATIVE NEGATIVE Final   Influenza B by PCR NEGATIVE NEGATIVE Final    Comment: (NOTE) The Xpert Xpress SARS-CoV-2/FLU/RSV plus assay is intended as an aid in the diagnosis of influenza from Nasopharyngeal swab specimens and should not be used as a sole basis for treatment. Nasal washings and aspirates are unacceptable for Xpert Xpress SARS-CoV-2/FLU/RSV testing.  Fact Sheet for Patients: EntrepreneurPulse.com.au  Fact Sheet for Healthcare Providers: IncredibleEmployment.be  This test is not yet approved or cleared by the [place] FDA and has been authorized for detection and/or diagnosis of SARS-CoV-2 by FDA under an Emergency Use Authorization (EUA). This EUA will remain in effect (meaning this test can be used) for the duration of the COVID-19 declaration under Section 564(b)(1) of the Act, 21 U.S.C. section 360bbb-3(b)(1), unless the authorization is terminated or revoked.     Resp Syncytial Virus by PCR NEGATIVE NEGATIVE Final    Comment: (NOTE) Fact Sheet for Patients: EntrepreneurPulse.com.au  Fact Sheet for Healthcare Providers: IncredibleEmployment.be  This test is not yet approved or cleared by the [place] FDA and has been  authorized for detection and/or diagnosis of SARS-CoV-2 by FDA under an Emergency Use Authorization (EUA). This EUA will remain in effect (meaning this test can be used) for the duration of the COVID-19 declaration under Section 564(b)(1) of the Act, 21 U.S.C. section 360bbb-3(b)(1), unless the authorization is terminated or revoked.  Performed at [facility], [place] [address], [place], [place] [address]   Urine Culture     Status: Abnormal   Collection Time: 03/12/22  4:48 PM   Specimen: In/Out Cath Urine  Result Value Ref Range Status   Specimen Description IN/OUT CATH URINE  Final   Special Requests   Final    NONE Performed at [facility], [place] [address] [place], [place] [address]    Culture (A)  Final    >=100,000 COLONIES/mL DIPHTHEROIDS(CORYNEBACTERIUM SPECIES) Standardized susceptibility testing for this organism is not available. 20,000 COLONIES/mL ENTEROCOCCUS FAECALIS    Report Status 03/15/2022 FINAL  Final   Organism ID, Bacteria ENTEROCOCCUS FAECALIS (A)  Final      Susceptibility   Enterococcus faecalis - MIC*    AMPICILLIN <=2 SENSITIVE Sensitive     NITROFURANTOIN <=16 SENSITIVE Sensitive     VANCOMYCIN 2 SENSITIVE Sensitive     * 20,000 COLONIES/mL ENTEROCOCCUS FAECALIS    Radiology Studies: DG CHEST PORT 1 VIEW  Result Date: 03/17/2022 CLINICAL DATA:  Sob; covid+ EXAM: PORTABLE CHEST - 1 VIEW COMPARISON:  03/16/2022 FINDINGS: Right middle lobe airspace disease is developing a somewhat more band like configuration. Left retrocardiac consolidation slightly increased. Stable cardiomegaly.  Aortic Atherosclerosis (ICD10-170.0). Blunting of left lateral costophrenic angle suggesting small effusion.  Visualized bones unremarkable. Implanted monitor overlies the lower left chest. IMPRESSION: Developing right middle lobe and left retrocardiac infiltrates or atelectasis. Electronically Signed   By: Lucrezia Europe M.D.   On: 03/17/2022 09:44   DG CHEST PORT 1  VIEW  Result Date: 03/16/2022 CLINICAL DATA:  Shortness of breath.  COVID-19 positive EXAM: PORTABLE CHEST 1 VIEW COMPARISON:  03/15/2022 FINDINGS: Lungs are adequately inflated with slight worsening hazy opacification over the right middle lobe region and medial right apex. No effusion. Cardiomediastinal silhouette and remainder of the exam is unchanged. IMPRESSION: Slight worsening hazy opacification over the right middle lobe region and medial right apex likely multifocal infection. Electronically Signed   By: Marin Olp M.D.   On: 03/16/2022 10:20    Scheduled Meds:  amLODipine  2.5 mg Oral QHS   apixaban  2.5 mg Oral BID   atorvastatin  40 mg Oral QHS   gabapentin  300 mg Oral TID   guaiFENesin  1,200 mg Oral BID   insulin aspart  0-9 Units Subcutaneous TID WC   methylPREDNISolone (SOLU-MEDROL) injection  40 mg Intravenous Q12H   mometasone-formoterol  2 puff Inhalation BID   sodium chloride flush  3 mL Intravenous Q12H   thiamine  50 mg Oral Daily   zinc sulfate  220 mg Oral Daily   Continuous Infusions:   LOS: 4 days   Raiford Noble, DO [facility] Available via Epic secure chat 7am-7pm After these hours, please refer to coverage provider listed on [email or web address] 03/17/2022, 4:48 PM

## 2022-03-17 NOTE — Plan of Care (Signed)
  Problem: Education: Goal: Knowledge of risk factors and measures for prevention of condition will improve Outcome: Progressing   Problem: Coping: Goal: Psychosocial and spiritual needs will be supported Outcome: Progressing   Problem: Respiratory: Goal: Will maintain a patent airway Outcome: Progressing Goal: Complications related to the disease process, condition or treatment will be avoided or minimized Outcome: Progressing   Problem: Education: Goal: Ability to describe self-care measures that may prevent or decrease complications (Diabetes Survival Skills Education) will improve Outcome: Progressing Goal: Individualized Educational Video(s) Outcome: Progressing   Problem: Coping: Goal: Ability to adjust to condition or change in health will improve Outcome: Progressing   Problem: Fluid Volume: Goal: Ability to maintain a balanced intake and output will improve Outcome: Progressing   Problem: Health Behavior/Discharge Planning: Goal: Ability to identify and utilize available resources and services will improve Outcome: Progressing Goal: Ability to manage health-related needs will improve Outcome: Progressing   Problem: Metabolic: Goal: Ability to maintain appropriate glucose levels will improve Outcome: Progressing   Problem: Nutritional: Goal: Maintenance of adequate nutrition will improve Outcome: Progressing Goal: Progress toward achieving an optimal weight will improve Outcome: Progressing   Problem: Skin Integrity: Goal: Risk for impaired skin integrity will decrease Outcome: Progressing   Problem: Tissue Perfusion: Goal: Adequacy of tissue perfusion will improve Outcome: Progressing   Problem: Education: Goal: Knowledge of General Education information will improve Description: Including pain rating scale, medication(s)/side effects and non-pharmacologic comfort measures Outcome: Progressing   Problem: Health Behavior/Discharge Planning: Goal:  Ability to manage health-related needs will improve Outcome: Progressing   Problem: Clinical Measurements: Goal: Ability to maintain clinical measurements within normal limits will improve Outcome: Progressing Goal: Will remain free from infection Outcome: Progressing Goal: Diagnostic test results will improve Outcome: Progressing Goal: Respiratory complications will improve Outcome: Progressing Goal: Cardiovascular complication will be avoided Outcome: Progressing   Problem: Activity: Goal: Risk for activity intolerance will decrease Outcome: Progressing   Problem: Nutrition: Goal: Adequate nutrition will be maintained Outcome: Progressing   Problem: Coping: Goal: Level of anxiety will decrease Outcome: Progressing   Problem: Elimination: Goal: Will not experience complications related to bowel motility Outcome: Progressing Goal: Will not experience complications related to urinary retention Outcome: Progressing   Problem: Pain Managment: Goal: General experience of comfort will improve Outcome: Progressing   Problem: Safety: Goal: Ability to remain free from injury will improve Outcome: Progressing   Problem: Skin Integrity: Goal: Risk for impaired skin integrity will decrease Outcome: Progressing

## 2022-03-18 ENCOUNTER — Inpatient Hospital Stay (HOSPITAL_COMMUNITY): Payer: Medicare Other

## 2022-03-18 DIAGNOSIS — U071 COVID-19: Secondary | ICD-10-CM | POA: Diagnosis not present

## 2022-03-18 DIAGNOSIS — G9340 Encephalopathy, unspecified: Secondary | ICD-10-CM | POA: Diagnosis not present

## 2022-03-18 DIAGNOSIS — E1142 Type 2 diabetes mellitus with diabetic polyneuropathy: Secondary | ICD-10-CM | POA: Diagnosis not present

## 2022-03-18 DIAGNOSIS — E039 Hypothyroidism, unspecified: Secondary | ICD-10-CM | POA: Diagnosis not present

## 2022-03-18 LAB — CBC WITH DIFFERENTIAL/PLATELET
Abs Immature Granulocytes: 0.05 10*3/uL (ref 0.00–0.07)
Basophils Absolute: 0 10*3/uL (ref 0.0–0.1)
Basophils Relative: 0 %
Eosinophils Absolute: 0 10*3/uL (ref 0.0–0.5)
Eosinophils Relative: 0 %
HCT: 35.9 % — ABNORMAL LOW (ref 36.0–46.0)
Hemoglobin: 11.9 g/dL — ABNORMAL LOW (ref 12.0–15.0)
Immature Granulocytes: 1 %
Lymphocytes Relative: 9 %
Lymphs Abs: 0.8 10*3/uL (ref 0.7–4.0)
MCH: 32 pg (ref 26.0–34.0)
MCHC: 33.1 g/dL (ref 30.0–36.0)
MCV: 96.5 fL (ref 80.0–100.0)
Monocytes Absolute: 0.7 10*3/uL (ref 0.1–1.0)
Monocytes Relative: 8 %
Neutro Abs: 7.8 10*3/uL — ABNORMAL HIGH (ref 1.7–7.7)
Neutrophils Relative %: 82 %
Platelets: 137 10*3/uL — ABNORMAL LOW (ref 150–400)
RBC: 3.72 MIL/uL — ABNORMAL LOW (ref 3.87–5.11)
RDW: 12.2 % (ref 11.5–15.5)
WBC: 9.4 10*3/uL (ref 4.0–10.5)
nRBC: 0 % (ref 0.0–0.2)

## 2022-03-18 LAB — COMPREHENSIVE METABOLIC PANEL
ALT: 12 U/L (ref 0–44)
AST: 15 U/L (ref 15–41)
Albumin: 2.8 g/dL — ABNORMAL LOW (ref 3.5–5.0)
Alkaline Phosphatase: 52 U/L (ref 38–126)
Anion gap: 7 (ref 5–15)
BUN: 46 mg/dL — ABNORMAL HIGH (ref 8–23)
CO2: 28 mmol/L (ref 22–32)
Calcium: 8.9 mg/dL (ref 8.9–10.3)
Chloride: 98 mmol/L (ref 98–111)
Creatinine, Ser: 1.46 mg/dL — ABNORMAL HIGH (ref 0.44–1.00)
GFR, Estimated: 34 mL/min — ABNORMAL LOW (ref >60)
Glucose, Bld: 189 mg/dL — ABNORMAL HIGH (ref 70–99)
Potassium: 4.7 mmol/L (ref 3.5–5.1)
Sodium: 133 mmol/L — ABNORMAL LOW (ref 135–145)
Total Bilirubin: 0.4 mg/dL (ref 0.3–1.2)
Total Protein: 5.1 g/dL — ABNORMAL LOW (ref 6.5–8.1)

## 2022-03-18 LAB — C-REACTIVE PROTEIN: CRP: 0.5 mg/dL (ref <1.0)

## 2022-03-18 LAB — GLUCOSE, CAPILLARY
Glucose-Capillary: 146 mg/dL — ABNORMAL HIGH (ref 70–99)
Glucose-Capillary: 176 mg/dL — ABNORMAL HIGH (ref 70–99)
Glucose-Capillary: 166 mg/dL — ABNORMAL HIGH (ref 70–99)
Glucose-Capillary: 345 mg/dL — ABNORMAL HIGH (ref 70–99)

## 2022-03-18 LAB — LACTATE DEHYDROGENASE: LDH: 179 U/L (ref 98–192)

## 2022-03-18 LAB — MAGNESIUM: Magnesium: 2.1 mg/dL (ref 1.7–2.4)

## 2022-03-18 LAB — FIBRINOGEN: Fibrinogen: 269 mg/dL (ref 210–475)

## 2022-03-18 LAB — SEDIMENTATION RATE: Sed Rate: 1 mm/hr (ref 0–22)

## 2022-03-18 LAB — D-DIMER, QUANTITATIVE: D-Dimer, Quant: 1.79 ug/mL-FEU — ABNORMAL HIGH (ref 0.00–0.50)

## 2022-03-18 LAB — PHOSPHORUS: Phosphorus: 3 mg/dL (ref 2.5–4.6)

## 2022-03-18 LAB — FERRITIN: Ferritin: 22 ng/mL (ref 11–307)

## 2022-03-18 MED ORDER — METHYLPREDNISOLONE SODIUM SUCC 40 MG IJ SOLR
40.0000 mg | Freq: Every day | INTRAMUSCULAR | Status: DC
  Administered 2022-03-19 – 2022-03-21 (×3): 40 mg via INTRAVENOUS
  Filled 2022-03-18 (×3): qty 1

## 2022-03-18 MED ORDER — SODIUM CHLORIDE 0.9 % IV BOLUS
500.0000 mL | Freq: Once | INTRAVENOUS | Status: AC
  Administered 2022-03-18: 500 mL via INTRAVENOUS

## 2022-03-18 NOTE — Progress Notes (Signed)
Occupational Therapy Treatment Patient Details Name: Penny Miller MRN: [id number] DOB: [birth year] Today's Date: 03/18/2022   History of present illness Pt is a 88 y/o female admitted for acute encephalopathy, COVID-19 infection and mild asthma exacerbation. PMH: HTN, CVA, a fib, CKD, DM, spinal stenosis.   OT comments  Patient received in supine and agreeable to OT session. Patient's HR during session in upper 40s and at end of session 37.  Patient on 3 liters of O2 with SpO2 92-89 and BP 162/73 at end of session. Session limited to EOB dressing and transfer to recliner. Patient demonstrating improvement with transfers and cognition. Patient continues to be appropriate for discharge home with [facility] with assistance provided by family.    Recommendations for follow up therapy are one component of a multi-disciplinary discharge planning process, led by the attending physician.  Recommendations may be updated based on patient status, additional functional criteria and insurance authorization.    Follow Up Recommendations  Home health OT     Assistance Recommended at Discharge Frequent or constant Supervision/Assistance  Patient can return home with the following  A little help with walking and/or transfers;A little help with bathing/dressing/bathroom;Assistance with cooking/housework;Direct supervision/assist for medications management   Equipment Recommendations  None recommended by OT    Recommendations for Other Services      Precautions / Restrictions Precautions Precautions: Fall;Other (comment) Precaution Comments: Monitor O2 and HR Restrictions Weight Bearing Restrictions: No       Mobility Bed Mobility Overal bed mobility: Needs Assistance Bed Mobility: Supine to Sit     Supine to sit: Min assist, HOB elevated     General bed mobility comments: assistance with BLEs and scooting to EOB    Transfers Overall transfer level: Needs assistance Equipment used: Rolling  walker (2 wheels) Transfers: Bed to chair/wheelchair/BSC, Sit to/from Stand Sit to Stand: Min assist     Step pivot transfers: Min guard     General transfer comment: transfer from EOB to recliner     Balance Overall balance assessment: Needs assistance Sitting-balance support: No upper extremity supported, Feet supported Sitting balance-Leahy Scale: Fair     Standing balance support: Bilateral upper extremity supported, During functional activity Standing balance-Leahy Scale: Poor Standing balance comment: reliant on RW for support                           ADL either performed or assessed with clinical judgement   ADL Overall ADL's : Needs assistance/impaired                 Upper Body Dressing : Minimal assistance;Sitting Upper Body Dressing Details (indicate cue type and reason): Assistance with changing gown due to lines Lower Body Dressing: Minimal assistance;Sit to/from stand Lower Body Dressing Details (indicate cue type and reason): min assist to donn socks seated on EOB Toilet Transfer: Min guard;Minimal assistance Toilet Transfer Details (indicate cue type and reason): simulated to recliner           General ADL Comments: limited ADL mobility due to HR and BP    Extremity/Trunk Assessment              Vision       Perception     Praxis      Cognition Arousal/Alertness: Awake/alert Behavior During Therapy: WFL for tasks assessed/performed Overall Cognitive Status: Impaired/Different from baseline Area of Impairment: Awareness  Awareness: Emergent   General Comments: Aware of confusion earlier in her stay, improved cognition on this date        Exercises      Shoulder Instructions       General Comments HR at end of session 37, HR in 40s during session, 3 liters of O2 SpO2 92-89%, BP 162/73    Pertinent Vitals/ Pain       Pain Assessment Pain Assessment: No/denies pain  Home  Living                                          Prior Functioning/Environment              Frequency  Min 2X/week        Progress Toward Goals  OT Goals(current goals can now be found in the care plan section)  Progress towards OT goals: Progressing toward goals  Acute Rehab OT Goals Patient Stated Goal: go home OT Goal Formulation: With patient/family Time For Goal Achievement: 03/28/22 Potential to Achieve Goals: Good ADL Goals Pt Will Perform Lower Body Bathing: with modified independence;sit to/from stand Pt Will Perform Lower Body Dressing: with modified independence;sitting/lateral leans;sit to/from stand Pt Will Transfer to Toilet: with modified independence;ambulating  Plan Discharge plan remains appropriate    Co-evaluation                 AM-PAC OT "6 Clicks" Daily Activity     Outcome Measure   Help from another person eating meals?: None Help from another person taking care of personal grooming?: A Little Help from another person toileting, which includes using toliet, bedpan, or urinal?: A Little Help from another person bathing (including washing, rinsing, drying)?: A Little Help from another person to put on and taking off regular upper body clothing?: A Little Help from another person to put on and taking off regular lower body clothing?: A Lot 6 Click Score: 18    End of Session Equipment Utilized During Treatment: Gait belt;Rolling walker (2 wheels);Oxygen  OT Visit Diagnosis: Unsteadiness on feet (R26.81);Muscle weakness (generalized) (M62.81)   Activity Tolerance Patient tolerated treatment well   Patient Left in chair;with call bell/phone within reach;with chair alarm set   Nurse Communication Mobility status (HR)        Time: 7628-3151 OT Time Calculation (min): 23 min  Charges: OT General Charges $OT Visit: 1 Visit OT Treatments $Self Care/Home Management : 8-22 mins $Therapeutic Activity: 8-22  mins  Lodema Hong, [place]  Office [phone]   Trixie Dredge 03/18/2022, 9:58 AM

## 2022-03-18 NOTE — Progress Notes (Signed)
PT Cancellation Note  Patient Details Name: Penny Miller MRN: [id number] DOB: May 24, 1931   Cancelled Treatment:    Reason Eval/Treat Not Completed: Other (comment) Checked on pt 1730 and she reports she is tired and would like to rest.  Pt was up with OT earlier today. Will f/u tomorrow as able. Abran Richard, PT Acute Rehab [facility] Rehab [phone]   Karlton Lemon 03/18/2022, 5:42 PM

## 2022-03-18 NOTE — Progress Notes (Signed)
PROGRESS NOTE    Penny Miller  DJT:[id number] DOB: 1931-12-04 DOA: 03/12/2022 PCP: [name], MD   Brief Narrative:  HPI per Dr. Neva Seat Penny Miller is a 88 y.o. female with medical history significant of spinal stenosis, sinus node dysfunction, diabetes, neuropathy, atrial fibrillation, hypertension, overactive bladder, anemia, asthma, hypothyroidism, CVA, CKD 4 presenting with altered mental status.   History obtained with assistance of patient's granddaughter and chart review due to patient's encephalopathy.  She was last noted to be normal yesterday morning.  Today she was noted to be confused and oriented x 1 only when at baseline she is oriented x 3 and conversant and able to perform ADLs.  She has a history of chronic cystitis and family was noting frequent trips to the bathroom without being able to go.   Patient unable to participate in full review of systems due to altered mentation.   ED Course: Vital signs in the ED significant for blood pressure in the 300P to 233A systolic.  Heart rate in the 50s to 60s.  Lab workup included CMP with creatinine stable at 1.44, glucose 100, protein 5.9, albumin 3.4.  CBC with hemoglobin stable at 10.4, platelets 140.  TSH normal.  Ethanol level negative.  Aspirin and Tylenol levels negative.  Urinalysis normal.  Urine culture pending.  Respiratory panel for flu and COVID positive.  Magnesium normal.  CT head without acute abnormality.  Chest x-ray with stable left greater than right pleural effusion and left basilar airspace disease.  Patient started on molnupiravir, Solu-Medrol, DuoNebs in the ED.     **Interim History She Is slowly improving and her mentation is better.  Continues to feel a little dyspneic and felt dizzy upon standing.  Will check orthostatics in the morning and give her gentle IV fluid hydration today.  Labs relatively stable but D-dimer slightly elevated.  If continues to be elevated tomorrow may pursue CTA of  the chest and lower extremity venous duplex.  OT recommending home health but PT does still evaluate and treat   03/15/2022: PT evaluated and recommending home health still.  She did desaturate on amatory home O2 screen.  CRP minimally went up but will change her steroids from prednisone to IV Solu-Medrol 40 mg twice daily.  She continues to be dyspneic and coughing.  Will repeat chest x-ray in the a.m.   03/16/2022.  Patient states that she is feeling better but still not able to cough up much sputum.  Flutter valve, incentive spirometry has still not been given to the patient yet and she still has not had her repeat orthostatic vital signs and repeat ambulatory home O2 screen.   03/17/22: Patient was dizzy this morning.  Respiratory status seems stable.  Inflammatory markers are trending down.  Other than her dizziness she states that she is feeling a little bit better.  She now reports that her son and her granddaughter have COVID and that she got a cold from her son.  03/18/22: Breathing is better but still dizzy. Will obtain a Vestibular Evaluation and give another 500 mL bolus   Assessment and Plan:  Acute Encephalopathy, improved COVID-19 infection Mild asthma exacerbation > Patient presenting with altered mental status for the past day or less.  Family had some concern for possible recurrent UTI given her history and frequent trips to the bathroom. > In the ED workup showed normal urinalysis.  No leukocytosis.  Chest x-ray stable but did show chronic pleural effusion and basilar airspace  disease.  CT head without acute abnormality either.  No significant electrolyte derangements. > Respiratory screening did come back positive for COVID-19 infection which is presumed etiology of encephalopathy.  She was also noted to be wheezing in the ED and received steroids and breathing treatment.  Also started on molnupiravir. -Continue to monitor on telemetry - Continue daily steroids and was on  prednisone 50 mg p.o. daily however will went back to IV Solumedrol 40 mg BID and will now start weaning and go to 40 mg Daily  -Inflammatory Marker Trend: Recent Labs    03/16/22 0300 03/17/22 0252 03/18/22 0448  DDIMER  --  1.49* 1.79*  FERRITIN '19 21 22  '$ LDH 185 176 179  CRP <0.5 0.5 0.5   Lab Results  Component Value Date   SARSCOV2NAA POSITIVE (A) 03/12/2022   [place] NEGATIVE 07/16/2021   [place] NEGATIVE 11/20/2020    -SpO2: 98 % O2 Flow Rate (L/min): 3 L/min - As needed albuterol -Leukopenia is likely from COVID infection -Ordering flutter valve and incentive spirometry as well as guaifenesin; Patient still has NOT received IS and Flutter Valve  - Continue molnupiravir for 5 days total -She will need an ambulatory home O2 screen prior to discharge and she did desaturate yesterday and repet Ambulatory Home O2 screen pending  -Repeat CXR this AM showed "Stable cardiomegaly. Stable to slightly improved opacities at the bilateral lung bases. Coarse interstitial markings bilaterally. No pleural effusion or pneumothorax is seen. Osseous structures about the chest are unremarkable." -Continue to Trend daily labs and inflammatory markers -Need PT and OT to further evaluate and treat; PT/OT both recommending Home Health   Hypertension - Continue home Amlodipine - Hold home Lasix given her dizziness upon standing we will give her gentle IV fluid hydration -Continue monitor blood pressures per protocol -Last BP reading was now 102/61   Thrombocytopenia  -The patient's platelet count has gone from 133 -> 120 -> 133 -> 124 -> 123 -> 137 -Continue to Monitor and Trend and Repeat CBC in the AM    History of CVA - Continue home Atorvastatin - Continue home Eliquis   Dizziness with standing, persistent  -SHe got dizzy and almost vomited while standing for quite a while.  Given gentle IV fluid hydration but stopped and will give a 500 ML Bolus and check orthostatics again  but continues to be dizzy -checked TSH and was 4.247 -TED Hose and Abdominal Binder -Will obtain Vestibular Evaluation  -PT evaluation recommending Home Health   Elevated D-dimer -Mild and likely related to COVID -D-Dimer went from 1.16 -> 1.08 -> 1.49 -> 1.79 -If he continues to be elevated may obtain a CTA of the chest to evaluate for PE but already anticoagulated on Apixaban    Atrial fibrillation -Continue home Apixaban - Not on rate control or rhythm control medication currently   Neuropathy -Continue Home Gabapentin   Diabetes Mellitus Type 2 - SSI and his CBGs likely will be elevated in the setting of steroid demargination -CBGs ranging from 146-229 -C/w Sensitive Novolog SS AC and will add 4 units    Normocytic/Macrocytic Anemia > Hemoglobin stable in the ED; now hemoglobin/hematocrit is now 10.2/32.1 -> 11.0/34.5 -> 11.5/34.4 -> 11.7/35.5 -> 11.9/35.9 with an MCV of 97.3 -Anemia panel was checked and showed an iron level of 65, UIBC of 277, TIBC of 342, saturation ratios of 90%, ferritin of 19, folate of 8.3, CRP less than 0.5 and a vitamin B12 of 1699 -Continue to Monitor for S/Sx  of Bleeding; No overt bleeding noted  -Continue to trend CBC -Continue with home Zinc and Thiamine   Hypoalbuminemia -Patient's albumin level is now 2.6 -> 2.8 -> 3.1 -> 2.8 x2 -Continue monitor and trend and repeat CMP in a.m.   CKD 3B > Creatinine stable at 1.44 in the ED. Now BUNs/creatinine has gone from 17/1.27 -> 26/1.33 -> 35/1.47 -> 42/1.33 -> 39/1.25 -> 46/1.46 -Avoid further nephrotoxic medications, contrast dyes, hypotension and dehydration ensure adequate renal perfusion -Will give her another 500 mL Bolus given her dizziness -continue monitor and trend renal function carefully and repeat CMP in the a.m.   Asymptomatic Bacteruria -UA not really convincing for UTI and Not Symptomatic  -Urine Cx done and showed:   ulture  Abnormal  >=100,000 COLONIES/mL  DIPHTHEROIDS(CORYNEBACTERIUM SPECIES) Standardized susceptibility testing for this organism is not available. 20,000 COLONIES/mL ENTEROCOCCUS FAECALIS   Report Status 03/15/2022 FINAL  Organism ID, Bacteria ENTEROCOCCUS FAECALIS Abnormal   Resulting Agency [facility]      Susceptibility                Enterococcus faecalis      MIC      AMPICILLIN <=2 SENSITIVE Sensitive      NITROFURANTOIN <=16 SENSIT... Sensitive      VANCOMYCIN 2 SENSITIVE Sensitive            DVT prophylaxis: Place TED hose Start: 03/17/22 1618 apixaban (ELIQUIS) tablet 2.5 mg Start: 03/12/22 2200 apixaban (ELIQUIS) tablet 2.5 mg    Code Status: DNR Family Communication: No family currently at bedside  Disposition Plan:  Level of care: Telemetry Medical Status is: Inpatient Remains inpatient appropriate because: Needs further clinical improvement and needs evaluation for continued dizziness upon standing   Consultants:  None  Procedures:  As delineated as above  Antimicrobials:  Anti-infectives (From admission, onward)    Start     Dose/Rate Route Frequency Ordered Stop   03/12/22 1900  molnupiravir EUA (LAGEVRIO) capsule 800 mg        4 capsule Oral 2 times daily 03/12/22 1800 03/17/22 0816       Subjective: Examined at bedside and thinks her respiratory status is doing better and coughing up some more sputum.  Continues to be dizzy and had some dizziness upon sitting up again this morning.  No nausea or vomiting.  Denies any other concerns or complaints this time.  Objective: Vitals:   03/18/22 1900 03/18/22 1947 03/18/22 2000 03/18/22 2004  BP:  (!) 104/53 102/61   Pulse: (!) 56 60 (!) 49 (!) 55  Resp: '17 15 17 17  '$ Temp:  98.1 F (36.7 C)    TempSrc:  Oral    SpO2: 98% 96% 97% 98%  Weight:      Height:        Intake/Output Summary (Last 24 hours) at 03/18/2022 2113 Last data filed at 03/18/2022 1016 Gross per 24 hour  Intake 3 ml  Output 1200 ml  Net -1197 ml   Filed  Weights   03/12/22 2134  Weight: 69.9 kg   Examination: Physical Exam:  Constitutional: WN/WD elderly Caucasian female currently no acute distress Respiratory: Diminished to auscultation bilaterally with coarse breath sounds, no wheezing, rales, rhonchi or crackles. Normal respiratory effort and patient is not tachypenic. No accessory muscle use.  Wearing supplemental oxygen via nasal cannula Cardiovascular: RRR, no murmurs / rubs / gallops. S1 and S2 auscultated.  Slight lower extremity extremity edema.  Abdomen: Soft, non-tender, non-distended. Bowel sounds positive  x 4.  GU: Deferred. Musculoskeletal: No clubbing / cyanosis of digits/nails. No joint deformity upper and lower extremities. Skin: No rashes, lesions, ulcers on limited skin evaluation. No induration; Warm and dry.  Neurologic: CN 2-12 grossly intact with no focal deficits. Romberg sign and cerebellar reflexes not assessed.  Psychiatric: Normal judgment and insight. Alert and oriented x 3. Normal mood and appropriate affect.   Data Reviewed: I have personally reviewed following labs and imaging studies  CBC: Recent Labs  Lab 03/14/22 0302 03/15/22 0330 03/16/22 0300 03/17/22 0252 03/18/22 0448  WBC 6.1 6.3 5.7 7.6 9.4  NEUTROABS 4.3 4.8 4.7 6.0 7.8*  HGB 10.2* 11.0* 11.5* 11.7* 11.9*  HCT 32.1* 34.5* 34.4* 35.5* 35.9*  MCV 102.2* 100.6* 98.0 97.3 96.5  PLT 120* 133* 124* 123* 782*   Basic Metabolic Panel: Recent Labs  Lab 03/14/22 0302 03/15/22 0330 03/16/22 0300 03/17/22 0252 03/18/22 0448  NA 139 135 134* 135 133*  K 3.2* 4.5 4.6 4.7 4.7  CL 105 99 98 100 98  CO2 '28 28 27 30 28  '$ GLUCOSE 107* 133* 218* 147* 189*  BUN 26* 35* 42* 39* 46*  CREATININE 1.33* 1.47* 1.33* 1.25* 1.46*  CALCIUM 8.2* 8.6* 8.8* 8.9 8.9  MG 1.8 2.1 2.1 2.1 2.1  PHOS 4.0 3.6 3.3 3.3 3.0   GFR: Estimated Creatinine Clearance: 24.9 mL/min (A) (by C-G formula based on SCr of 1.46 mg/dL (H)). Liver Function Tests: Recent Labs   Lab 03/14/22 0302 03/15/22 0330 03/16/22 0300 03/17/22 0252 03/18/22 0448  AST '21 21 20 16 15  '$ ALT '10 13 14 13 12  '$ ALKPHOS 51 61 61 57 52  BILITOT 0.3 0.4 0.5 0.4 0.4  PROT 4.9* 5.2* 5.4* 5.2* 5.1*  ALBUMIN 2.6* 2.8* 3.1* 2.8* 2.8*   No results for input(s): "LIPASE", "AMYLASE" in the last 168 hours. No results for input(s): "AMMONIA" in the last 168 hours. Coagulation Profile: No results for input(s): "INR", "PROTIME" in the last 168 hours. Cardiac Enzymes: No results for input(s): "CKTOTAL", "CKMB", "CKMBINDEX", "TROPONINI" in the last 168 hours. BNP (last 3 results) Recent Labs    02/20/22 1150 03/04/22 0958  PROBNP 1,012.0* 863.0*   HbA1C: No results for input(s): "HGBA1C" in the last 72 hours. CBG: Recent Labs  Lab 03/17/22 1653 03/17/22 2131 03/18/22 0905 03/18/22 1300 03/18/22 1636  GLUCAP 229* 217* 146* 176* 166*   Lipid Profile: No results for input(s): "CHOL", "HDL", "LDLCALC", "TRIG", "CHOLHDL", "LDLDIRECT" in the last 72 hours. Thyroid Function Tests: No results for input(s): "TSH", "T4TOTAL", "FREET4", "T3FREE", "THYROIDAB" in the last 72 hours. Anemia Panel: Recent Labs    03/16/22 0300 03/17/22 0252 03/18/22 0448  VITAMINB12 1,699*  --   --   FOLATE 8.3  --   --   FERRITIN '19 21 22  '$ TIBC 342  --   --   IRON 65  --   --   RETICCTPCT 1.9  --   --    Sepsis Labs: No results for input(s): "PROCALCITON", "LATICACIDVEN" in the last 168 hours.  Recent Results (from the past 240 hour(s))  Resp panel by RT-PCR (RSV, Flu A&B, Covid) Anterior Nasal Swab     Status: Abnormal   Collection Time: 03/12/22  2:47 PM   Specimen: Anterior Nasal Swab  Result Value Ref Range Status   SARS Coronavirus 2 by RT PCR POSITIVE (A) NEGATIVE Final    Comment: (NOTE) SARS-CoV-2 target nucleic acids are DETECTED.  The SARS-CoV-2 RNA is generally detectable in upper  respiratory specimens during the acute phase of infection. Positive results are indicative of the  presence of the identified virus, but do not rule out bacterial infection or co-infection with other pathogens not detected by the test. Clinical correlation with patient history and other diagnostic information is necessary to determine patient infection status. The expected result is Negative.  Fact Sheet for Patients: EntrepreneurPulse.com.au  Fact Sheet for Healthcare Providers: IncredibleEmployment.be  This test is not yet approved or cleared by the [place] FDA and  has been authorized for detection and/or diagnosis of SARS-CoV-2 by FDA under an Emergency Use Authorization (EUA).  This EUA will remain in effect (meaning this test can be used) for the duration of  the COVID-19 declaration under Section 564(b)(1) of the A ct, 21 U.S.C. section 360bbb-3(b)(1), unless the authorization is terminated or revoked sooner.     Influenza A by PCR NEGATIVE NEGATIVE Final   Influenza B by PCR NEGATIVE NEGATIVE Final    Comment: (NOTE) The Xpert Xpress SARS-CoV-2/FLU/RSV plus assay is intended as an aid in the diagnosis of influenza from Nasopharyngeal swab specimens and should not be used as a sole basis for treatment. Nasal washings and aspirates are unacceptable for Xpert Xpress SARS-CoV-2/FLU/RSV testing.  Fact Sheet for Patients: EntrepreneurPulse.com.au  Fact Sheet for Healthcare Providers: IncredibleEmployment.be  This test is not yet approved or cleared by the [place] FDA and has been authorized for detection and/or diagnosis of SARS-CoV-2 by FDA under an Emergency Use Authorization (EUA). This EUA will remain in effect (meaning this test can be used) for the duration of the COVID-19 declaration under Section 564(b)(1) of the Act, 21 U.S.C. section 360bbb-3(b)(1), unless the authorization is terminated or revoked.     Resp Syncytial Virus by PCR NEGATIVE NEGATIVE Final    Comment:  (NOTE) Fact Sheet for Patients: EntrepreneurPulse.com.au  Fact Sheet for Healthcare Providers: IncredibleEmployment.be  This test is not yet approved or cleared by the [place] FDA and has been authorized for detection and/or diagnosis of SARS-CoV-2 by FDA under an Emergency Use Authorization (EUA). This EUA will remain in effect (meaning this test can be used) for the duration of the COVID-19 declaration under Section 564(b)(1) of the Act, 21 U.S.C. section 360bbb-3(b)(1), unless the authorization is terminated or revoked.  Performed at [facility], [place] [address] [place], [place] [address]   Urine Culture     Status: Abnormal   Collection Time: 03/12/22  4:48 PM   Specimen: In/Out Cath Urine  Result Value Ref Range Status   Specimen Description IN/OUT CATH URINE  Final   Special Requests   Final    NONE Performed at [facility], [place] [address], [place],  [address]    Culture (A)  Final    >=100,000 COLONIES/mL DIPHTHEROIDS(CORYNEBACTERIUM SPECIES) Standardized susceptibility testing for this organism is not available. 20,000 COLONIES/mL ENTEROCOCCUS FAECALIS    Report Status 03/15/2022 FINAL  Final   Organism ID, Bacteria ENTEROCOCCUS FAECALIS (A)  Final      Susceptibility   Enterococcus faecalis - MIC*    AMPICILLIN <=2 SENSITIVE Sensitive     NITROFURANTOIN <=16 SENSITIVE Sensitive     VANCOMYCIN 2 SENSITIVE Sensitive     * 20,000 COLONIES/mL ENTEROCOCCUS FAECALIS     Radiology Studies: DG CHEST PORT 1 VIEW  Result Date: 03/18/2022 CLINICAL DATA:  Shortness of breath EXAM: PORTABLE CHEST 1 VIEW COMPARISON:  Chest x-rays dated 02/15/2022 and 11/20/2020. Chest CT dated 03/08/2022. FINDINGS: Stable cardiomegaly. Stable to  slightly improved opacities at the bilateral lung bases. Coarse interstitial markings bilaterally. No pleural effusion or pneumothorax is seen. Osseous structures about the chest are  unremarkable. IMPRESSION: 1. Stable to slightly improved opacities at the bilateral lung bases, atelectasis versus pneumonia. 2. Stable cardiomegaly. Electronically Signed   By: Franki Cabot M.D.   On: 03/18/2022 08:05   DG CHEST PORT 1 VIEW  Result Date: 03/17/2022 CLINICAL DATA:  Sob; covid+ EXAM: PORTABLE CHEST - 1 VIEW COMPARISON:  03/16/2022 FINDINGS: Right middle lobe airspace disease is developing a somewhat more band like configuration. Left retrocardiac consolidation slightly increased. Stable cardiomegaly.  Aortic Atherosclerosis (ICD10-170.0). Blunting of left lateral costophrenic angle suggesting small effusion. Visualized bones unremarkable. Implanted monitor overlies the lower left chest. IMPRESSION: Developing right middle lobe and left retrocardiac infiltrates or atelectasis. Electronically Signed   By: Lucrezia Europe M.D.   On: 03/17/2022 09:44    Scheduled Meds:  amLODipine  2.5 mg Oral QHS   apixaban  2.5 mg Oral BID   atorvastatin  40 mg Oral QHS   gabapentin  300 mg Oral TID   guaiFENesin  1,200 mg Oral BID   insulin aspart  0-9 Units Subcutaneous TID WC   insulin aspart  4 Units Subcutaneous TID WC   methylPREDNISolone (SOLU-MEDROL) injection  40 mg Intravenous Q12H   mometasone-formoterol  2 puff Inhalation BID   sodium chloride flush  3 mL Intravenous Q12H   thiamine  50 mg Oral Daily   zinc sulfate  220 mg Oral Daily   Continuous Infusions:  sodium chloride      LOS: 5 days   Raiford Noble, DO [facility] Available via Epic secure chat 7am-7pm After these hours, please refer to coverage provider listed on [email or web address] 03/18/2022, 9:13 PM

## 2022-03-18 NOTE — Plan of Care (Signed)
  Problem: Education: Goal: Knowledge of risk factors and measures for prevention of condition will improve Outcome: Progressing   Problem: Coping: Goal: Psychosocial and spiritual needs will be supported Outcome: Progressing   Problem: Respiratory: Goal: Will maintain a patent airway Outcome: Progressing Goal: Complications related to the disease process, condition or treatment will be avoided or minimized Outcome: Progressing   Problem: Education: Goal: Ability to describe self-care measures that may prevent or decrease complications (Diabetes Survival Skills Education) will improve Outcome: Progressing Goal: Individualized Educational Video(s) Outcome: Progressing   Problem: Coping: Goal: Ability to adjust to condition or change in health will improve Outcome: Progressing   Problem: Fluid Volume: Goal: Ability to maintain a balanced intake and output will improve Outcome: Progressing   Problem: Health Behavior/Discharge Planning: Goal: Ability to identify and utilize available resources and services will improve Outcome: Progressing Goal: Ability to manage health-related needs will improve Outcome: Progressing   Problem: Metabolic: Goal: Ability to maintain appropriate glucose levels will improve Outcome: Progressing   Problem: Nutritional: Goal: Maintenance of adequate nutrition will improve Outcome: Progressing Goal: Progress toward achieving an optimal weight will improve Outcome: Progressing   Problem: Skin Integrity: Goal: Risk for impaired skin integrity will decrease Outcome: Progressing   Problem: Tissue Perfusion: Goal: Adequacy of tissue perfusion will improve Outcome: Progressing   Problem: Education: Goal: Knowledge of General Education information will improve Description: Including pain rating scale, medication(s)/side effects and non-pharmacologic comfort measures Outcome: Progressing   Problem: Health Behavior/Discharge Planning: Goal:  Ability to manage health-related needs will improve Outcome: Progressing   Problem: Clinical Measurements: Goal: Ability to maintain clinical measurements within normal limits will improve Outcome: Progressing Goal: Will remain free from infection Outcome: Progressing Goal: Diagnostic test results will improve Outcome: Progressing Goal: Respiratory complications will improve Outcome: Progressing Goal: Cardiovascular complication will be avoided Outcome: Progressing   Problem: Activity: Goal: Risk for activity intolerance will decrease Outcome: Progressing   Problem: Nutrition: Goal: Adequate nutrition will be maintained Outcome: Progressing   Problem: Coping: Goal: Level of anxiety will decrease Outcome: Progressing   Problem: Elimination: Goal: Will not experience complications related to bowel motility Outcome: Progressing Goal: Will not experience complications related to urinary retention Outcome: Progressing   Problem: Pain Managment: Goal: General experience of comfort will improve Outcome: Progressing   Problem: Safety: Goal: Ability to remain free from injury will improve Outcome: Progressing   Problem: Skin Integrity: Goal: Risk for impaired skin integrity will decrease Outcome: Progressing

## 2022-03-19 ENCOUNTER — Telehealth: Payer: Self-pay | Admitting: Internal Medicine

## 2022-03-19 ENCOUNTER — Other Ambulatory Visit (HOSPITAL_COMMUNITY): Payer: Self-pay

## 2022-03-19 ENCOUNTER — Inpatient Hospital Stay (HOSPITAL_COMMUNITY): Payer: Medicare Other

## 2022-03-19 ENCOUNTER — Encounter: Payer: Self-pay | Admitting: Oncology

## 2022-03-19 DIAGNOSIS — E039 Hypothyroidism, unspecified: Secondary | ICD-10-CM | POA: Diagnosis not present

## 2022-03-19 DIAGNOSIS — R42 Dizziness and giddiness: Secondary | ICD-10-CM

## 2022-03-19 DIAGNOSIS — U071 COVID-19: Secondary | ICD-10-CM | POA: Diagnosis not present

## 2022-03-19 DIAGNOSIS — G9340 Encephalopathy, unspecified: Secondary | ICD-10-CM | POA: Diagnosis not present

## 2022-03-19 DIAGNOSIS — J45901 Unspecified asthma with (acute) exacerbation: Secondary | ICD-10-CM | POA: Diagnosis not present

## 2022-03-19 LAB — MAGNESIUM: Magnesium: 2.1 mg/dL (ref 1.7–2.4)

## 2022-03-19 LAB — CBC WITH DIFFERENTIAL/PLATELET
Abs Immature Granulocytes: 0.07 10*3/uL (ref 0.00–0.07)
Basophils Absolute: 0 10*3/uL (ref 0.0–0.1)
Basophils Relative: 0 %
Eosinophils Absolute: 0 10*3/uL (ref 0.0–0.5)
Eosinophils Relative: 0 %
HCT: 36 % (ref 36.0–46.0)
Hemoglobin: 11.4 g/dL — ABNORMAL LOW (ref 12.0–15.0)
Immature Granulocytes: 1 %
Lymphocytes Relative: 8 %
Lymphs Abs: 0.7 10*3/uL (ref 0.7–4.0)
MCH: 31.8 pg (ref 26.0–34.0)
MCHC: 31.7 g/dL (ref 30.0–36.0)
MCV: 100.3 fL — ABNORMAL HIGH (ref 80.0–100.0)
Monocytes Absolute: 0.8 10*3/uL (ref 0.1–1.0)
Monocytes Relative: 8 %
Neutro Abs: 7.9 10*3/uL — ABNORMAL HIGH (ref 1.7–7.7)
Neutrophils Relative %: 83 %
Platelets: 133 10*3/uL — ABNORMAL LOW (ref 150–400)
RBC: 3.59 MIL/uL — ABNORMAL LOW (ref 3.87–5.11)
RDW: 12.5 % (ref 11.5–15.5)
WBC: 9.5 10*3/uL (ref 4.0–10.5)
nRBC: 0 % (ref 0.0–0.2)

## 2022-03-19 LAB — COMPREHENSIVE METABOLIC PANEL
ALT: 13 U/L (ref 0–44)
AST: 16 U/L (ref 15–41)
Albumin: 2.5 g/dL — ABNORMAL LOW (ref 3.5–5.0)
Alkaline Phosphatase: 55 U/L (ref 38–126)
Anion gap: 5 (ref 5–15)
BUN: 43 mg/dL — ABNORMAL HIGH (ref 8–23)
CO2: 25 mmol/L (ref 22–32)
Calcium: 8.4 mg/dL — ABNORMAL LOW (ref 8.9–10.3)
Chloride: 103 mmol/L (ref 98–111)
Creatinine, Ser: 1.29 mg/dL — ABNORMAL HIGH (ref 0.44–1.00)
GFR, Estimated: 40 mL/min — ABNORMAL LOW (ref >60)
Glucose, Bld: 161 mg/dL — ABNORMAL HIGH (ref 70–99)
Potassium: 4.5 mmol/L (ref 3.5–5.1)
Sodium: 133 mmol/L — ABNORMAL LOW (ref 135–145)
Total Bilirubin: 0.3 mg/dL (ref 0.3–1.2)
Total Protein: 4.6 g/dL — ABNORMAL LOW (ref 6.5–8.1)

## 2022-03-19 LAB — GLUCOSE, CAPILLARY
Glucose-Capillary: 122 mg/dL — ABNORMAL HIGH (ref 70–99)
Glucose-Capillary: 117 mg/dL — ABNORMAL HIGH (ref 70–99)
Glucose-Capillary: 146 mg/dL — ABNORMAL HIGH (ref 70–99)
Glucose-Capillary: 83 mg/dL (ref 70–99)

## 2022-03-19 LAB — PHOSPHORUS: Phosphorus: 3.4 mg/dL (ref 2.5–4.6)

## 2022-03-19 MED ORDER — PREDNISONE 10 MG (21) PO TBPK
ORAL_TABLET | ORAL | 0 refills | Status: DC
  Filled 2022-03-19: qty 21, 6d supply, fill #0

## 2022-03-19 MED ORDER — POLYETHYLENE GLYCOL 3350 17 GM/SCOOP PO POWD
17.0000 g | Freq: Every day | ORAL | 0 refills | Status: DC | PRN
  Filled 2022-03-19: qty 238, 14d supply, fill #0

## 2022-03-19 MED ORDER — SODIUM CHLORIDE 0.9 % IV SOLN: INTRAVENOUS | Status: DC

## 2022-03-19 MED ORDER — SODIUM CHLORIDE 0.9 % IV BOLUS: 500.0000 mL | Freq: Once | INTRAVENOUS | Status: DC

## 2022-03-19 MED ORDER — GUAIFENESIN ER 600 MG PO TB12
1200.0000 mg | ORAL_TABLET | Freq: Two times a day (BID) | ORAL | 0 refills | Status: AC
  Filled 2022-03-19: qty 20, 5d supply, fill #0

## 2022-03-19 MED ORDER — SODIUM CHLORIDE 0.9 % IV BOLUS
250.0000 mL | Freq: Once | INTRAVENOUS | Status: AC
  Administered 2022-03-19: 250 mL via INTRAVENOUS

## 2022-03-19 NOTE — Discharge Summary (Addendum)
Physician Discharge Summary   Patient: Penny Miller MRN: [id number] DOB: [birth year]  Admit date:     03/12/2022  Discharge date: 03/19/22  Discharge Physician: Penny Noble, DO   PCP: Penny Lima, MD   Recommendations at discharge:   Follow up with PCP Within 1-2 weeks and repeat CBC, CMP, Mag, Phos within 1 week Repeat CXR in 3-6 weeks   Discharge Diagnoses: Principal Problem:   Acute encephalopathy Active Problems:   Essential hypertension   DM (diabetes mellitus), type 2 with renal complications (HCC)   History of CVA (cerebrovascular accident)   Paroxysmal atrial fibrillation (HCC)   Diabetic polyneuropathy associated with type 2 diabetes mellitus ([place])   Acquired hypothyroidism   Chronic renal disease, stage 4, severely decreased glomerular filtration rate (GFR) between 15-29 mL/min/1.73 square meter (HCC)   COVID-19 virus infection   Asthma, chronic, unspecified asthma severity, with acute exacerbation  Resolved Problems:   * No resolved hospital problems. [facility] Course: HPI per Dr. Neva Seat Penny Miller is a 88 y.o. female with medical history significant of spinal stenosis, sinus node dysfunction, diabetes, neuropathy, atrial fibrillation, hypertension, overactive bladder, anemia, asthma, hypothyroidism, CVA, CKD 4 presenting with altered mental status.   History obtained with assistance of patient's granddaughter and chart review due to patient's encephalopathy.  She was last noted to be normal yesterday morning.  Today she was noted to be confused and oriented x 1 only when at baseline she is oriented x 3 and conversant and able to perform ADLs.  She has a history of chronic cystitis and family was noting frequent trips to the bathroom without being able to go.   Patient unable to participate in full review of systems due to altered mentation.   ED Course: Vital signs in the ED significant for blood pressure in the 034V to 425Z systolic.  Heart  rate in the 50s to 60s.  Lab workup included CMP with creatinine stable at 1.44, glucose 100, protein 5.9, albumin 3.4.  CBC with hemoglobin stable at 10.4, platelets 140.  TSH normal.  Ethanol level negative.  Aspirin and Tylenol levels negative.  Urinalysis normal.  Urine culture pending.  Respiratory panel for flu and COVID positive.  Magnesium normal.  CT head without acute abnormality.  Chest x-ray with stable left greater than right pleural effusion and left basilar airspace disease.  Patient started on molnupiravir, Solu-Medrol, DuoNebs in the ED.     **Interim History She Is slowly improving and her mentation is better.  Continues to feel a little dyspneic and felt dizzy upon standing.  Will check orthostatics in the morning and give her gentle IV fluid hydration today.  Labs relatively stable but D-dimer slightly elevated.  If continues to be elevated tomorrow may pursue CTA of the chest and lower extremity venous duplex.  OT recommending home health but PT does still evaluate and treat   03/15/2022: PT evaluated and recommending home health still.  She did desaturate on amatory home O2 screen.  CRP minimally went up but will change her steroids from prednisone to IV Solu-Medrol 40 mg twice daily.  She continues to be dyspneic and coughing.  Will repeat chest x-ray in the a.m.   03/16/2022.  Patient states that she is feeling better but still not able to cough up much sputum.  Flutter valve, incentive spirometry has still not been given to the patient yet and she still has not had her repeat orthostatic vital signs and repeat ambulatory home  O2 screen.   03/17/22: Patient was dizzy this morning.  Respiratory status seems stable.  Inflammatory markers are trending down.  Other than her dizziness she states that she is feeling a little bit better.  She now reports that her son and her granddaughter have COVID and that she got a cold from her son.   03/18/22: Breathing is better but still dizzy.  Will obtain a Vestibular Evaluation and give another 500 mL bolus  03/19/2022: Vestibular evaluation done and this was negative.  PT worked with the patient and she complained of no dizziness during the session at all today and she improved.  She did have orthostatic vital signs done which showed that she did have some orthostasis but she was not dizzy.  Prior to her discharge she was given bolus given the drop in blood pressure and she was able to she ambulated 45 feet with a rolling walker and felt well and will require supplemental oxygen at home if she desaturated.  She is also provided with an abdominal binder.  She improved significantly and was deemed medically stable for discharge with outpatient follow-up with PCP and repeat chest x-ray in 3 to 6 weeks.  She is improved from a pulmonary standpoint and inflammatory markers are improved.  She will need to follow-up with PCP as well as pulm in outpatient setting as well with her cardiologist  ADDENDUM 20:00: Patient was stable to be discharged this afternoon however right prior to discharge she became lightheaded upon ambulation again and patient and son refused discharge.  She did well with therapy this morning however prior to discharge she became so light headed that she almost collapsed and this is an acute change in her status so discharge has been held today.  She desaturates on ambulation and her pulse stays on the lower side but she denies any chest pain.  She will be kept for IV fluid hydration and may need to Consult cardiology in the morning as it is now reported that she is supposed to get a pacer prior to getting COVID.  She did well with her vestibular testing this morning and had noted dizziness upon evaluation with the physical therapist but will need to be reevaluated in the morning with repeat orthostatic vital signs.  Assessment and Plan:  Acute Encephalopathy, improved COVID-19 infection Mild asthma exacerbation > Patient  presenting with altered mental status for the past day or less.  Family had some concern for possible recurrent UTI given her history and frequent trips to the bathroom. > In the ED workup showed normal urinalysis.  No leukocytosis.  Chest x-ray stable but did show chronic pleural effusion and basilar airspace disease.  CT head without acute abnormality either.  No significant electrolyte derangements. > Respiratory screening did come back positive for COVID-19 infection which is presumed etiology of encephalopathy.  She was also noted to be wheezing in the ED and received steroids and breathing treatment.  Also started on molnupiravir which she has completed -Continue to monitor on telemetry - Continue daily steroids and was on prednisone 50 mg p.o. daily however will went back to IV Solumedrol 40 mg BID and will now start weaning and go to 40 mg Daily and transition to Sterapred taper at discharge -[place]    03/17/22 0252 03/18/22 0448  DDIMER 1.49* 1.79*  FERRITIN 21 22  LDH 176 179  CRP 0.5 0.5    Lab Results  Component Value Date   SARSCOV2NAA POSITIVE (A)  03/12/2022   SARSCOV2NAA NEGATIVE 07/16/2021   [place] NEGATIVE 11/20/2020    SpO2: 96 % O2 Flow Rate (L/min): 4 L/min - As needed albuterol -Leukopenia is likely from COVID infection -Ordering flutter valve and incentive spirometry as well as guaifenesin; Patient still has NOT received IS and Flutter Valve  - Continued molnupiravir for 5 days total -Ambulatory home O2 screen done and she did desaturate so she will require oxygen for discharge -Repeat CXR yesterday AM showed "Stable cardiomegaly. Stable to slightly improved opacities at the bilateral lung bases. Coarse interstitial markings bilaterally. No pleural effusion or pneumothorax is seen. Osseous structures about the chest are unremarkable." -Continue to Trend daily labs and inflammatory markers -Need PT and OT to further evaluate and treat;  PT/OT both recommending Home Health and given today's PT session she is stable for discharge given that she is not symptomatic or dizzy with ambulation   Hypertension - Continue home Amlodipine - Hold home Lasix given her dizziness upon standing we will give her gentle IV fluid hydration -Continue monitor blood pressures per protocol -Last BP reading was now 129/49 at the time of discharge   Thrombocytopenia  -The patient's platelet count has gone from 133 -> 120 -> 133 -> 124 -> 123 -> 137 and is now 133 -Continue to Monitor and Trend and Repeat CBC in the AM    History of CVA - Continue home Atorvastatin - Continue home Eliquis   Dizziness with standing, persistent but now improved after today's PT session and she is asymptomatic -SHe got dizzy and almost vomited while standing for quite a while.  Given gentle IV fluid hydration but stopped and will give a 500 ML Bolus yesterday and was given 250 mL -checked TSH and was 4.247 -Patient worked with PT today and was asymptomatic and had no dizziness upon ambulation.  Her blood pressure did drop with orthostatic checks however she was asymptomatic; will give her a bolus prior to discharge and ensure that she has TED hose and an abdominal binder -TED Hose and Abdominal Binder ordered -Will obtain Vestibular Evaluation and this was negative -PT evaluation recommending Home Health   Elevated D-dimer -Mild and likely related to COVID -D-Dimer went from 1.16 -> 1.08 -> 1.49 -> 1.79 -If he continues to be elevated may obtain a CTA of the chest to evaluate for PE but already anticoagulated on Apixaban    Atrial fibrillation with slow ventricular rates -Continue home Apixaban - Not on rate control or rhythm control medication currently given her bradycardia -Follow-up with cardiology in outpatient setting   Neuropathy -Continue Home Gabapentin   Diabetes Mellitus Type 2 - SSI and his CBGs likely will be elevated in the setting of steroid  demargination -CBGs ranging from 83-3 45 -C/w Sensitive Novolog SS AC and will add 4 units    Normocytic/Macrocytic Anemia > Hemoglobin stable in the ED; now hemoglobin/hematocrit is now 10.2/32.1 -> 11.0/34.5 -> 11.5/34.4 -> 11.7/35.5 -> 11.9/35.9 and today is now 11.4/36.0 with an MCV of 100.3 -Anemia panel was checked and showed an iron level of 65, UIBC of 277, TIBC of 342, saturation ratios of 90%, ferritin of 19, folate of 8.3, CRP less than 0.5 and a vitamin B12 of 1699 -Continue to Monitor for S/Sx of Bleeding; No overt bleeding noted  -Continue to trend CBC -Continue with home Zinc and Thiamine   Hypoalbuminemia -Patient's albumin level is now 2.6 -> 2.8 -> 3.1 -> 2.8 x2 and is now 2.5 -Continue monitor and trend and  repeat CMP in a.m.   CKD 3B > Creatinine stable at 1.44 in the ED.  --BUN/Cr Trend: Recent Labs  Lab 03/13/22 0449 03/14/22 0302 03/15/22 0330 03/16/22 0300 03/17/22 0252 03/18/22 0448 03/19/22 0442  BUN 17 26* 35* 42* 39* 46* 43*  CREATININE 1.27* 1.33* 1.47* 1.33* 1.25* 1.46* 1.29*  -Avoid further nephrotoxic medications, contrast dyes, hypotension and dehydration ensure adequate renal perfusion -She was given a 500 mL Bolus given her dizziness yesterday but she is not dizzy today but given hr drop and orthostatic vital signs with no symptoms we will still give her 250 bolus prior to discharge -continue monitor and trend renal function carefully and repeat CMP within 1 week   Asymptomatic Bacteruria -UA not really convincing for UTI and Not Symptomatic  -Urine Cx done and showed:   ulture  Abnormal  >=100,000 COLONIES/mL DIPHTHEROIDS(CORYNEBACTERIUM SPECIES) Standardized susceptibility testing for this organism is not available. 20,000 COLONIES/mL ENTEROCOCCUS FAECALIS   Report Status 03/15/2022 FINAL  Organism ID, Bacteria ENTEROCOCCUS FAECALIS Abnormal   Resulting Agency [facility]      Susceptibility                Enterococcus faecalis       MIC      AMPICILLIN <=2 SENSITIVE Sensitive      NITROFURANTOIN <=16 SENSIT... Sensitive      VANCOMYCIN 2 SENSITIVE Sensitive             Consultants: None Procedures performed: As delineated as above Disposition: Home health Diet recommendation:  Discharge Diet Orders (From admission, onward)     Start     Ordered   03/19/22 0000  Diet - low sodium heart healthy        03/19/22 1559   03/19/22 0000  Diet Carb Modified        03/19/22 1559           Cardiac and Carb modified diet DISCHARGE MEDICATION: Allergies as of 03/19/2022       Reactions   Jardiance [empagliflozin] Diarrhea   Metformin And Related Diarrhea   Codeine Nausea And Vomiting   Metoprolol Nausea And Vomiting   Relafen [nabumetone] Nausea And Vomiting   Septra [sulfamethoxazole-trimethoprim] Nausea And Vomiting   Tetracycline Nausea And Vomiting   Adhesive [tape] Rash, Other (See Comments)   Tears the skin also   Ceclor [cefaclor] Other (See Comments)   Unknown reaction; not recalled   Cefaclor Other (See Comments)   Unknown reaction   Codeine Nausea And Vomiting   Jardiance [empagliflozin] Diarrhea   Lopid [gemfibrozil] Other (See Comments)   Unknown reaction; not recalled   Lopid [gemfibrozil] Other (See Comments)   Unknown reaction   Lopressor [metoprolol Tartrate] Nausea And Vomiting   Metformin And Related Diarrhea   Morphine And Related Other (See Comments)   Passed out   Morphine And Related Other (See Comments)   Passed out   Niacin Nausea And Vomiting   Unknown reaction Unknown reaction; not recalled   Niacin And Related Other (See Comments)   Unknown reaction; not recalled   Niacin And Related Other (See Comments)   Unknown reaction   Relafen [nabumetone] Nausea And Vomiting   Septra [sulfamethoxazole-trimethoprim] Nausea And Vomiting   Tape Rash, Other (See Comments)   Tears skin   Tetracyclines & Related Nausea And Vomiting   Toprol Xl [metoprolol Tartrate] Nausea And  Vomiting   Toprol Xl [metoprolol] Nausea And Vomiting        Medication List  TAKE these medications    Accu-Chek Aviva Plus test strip Generic drug: glucose blood USE TO TEST BLOOD SUGAR TWICE DAILY.   accu-chek multiclix lancets Use to test blood sugar twice daily. E11.29   acetaminophen 500 MG tablet Commonly known as: TYLENOL Take 500 mg by mouth every 6 (six) hours as needed (pain).   amLODipine 2.5 MG tablet Commonly known as: NORVASC Take 2.5 mg by mouth at bedtime.   apixaban 2.5 MG Tabs tablet Commonly known as: Eliquis Take 1 tablet (2.5 mg total) by mouth 2 (two) times daily.   atorvastatin 40 MG tablet Commonly known as: LIPITOR TAKE 1 TABLET AT BEDTIME What changed: when to take this   budesonide 0.5 MG/2ML nebulizer solution Commonly known as: PULMICORT USE 1 VIAL  IN  NEBULIZER TWICE  DAILY - - Rinse Mouth After Treatment What changed: See the new instructions.   citalopram 10 MG tablet Commonly known as: CELEXA TAKE 1 TABLET DAILY   famotidine 20 MG tablet Commonly known as: PEPCID Take one tablet once to twice a day What changed:  how much to take how to take this when to take this additional instructions   ferrous sulfate 325 (65 FE) MG tablet Take 1 tablet (325 mg total) by mouth 2 (two) times daily with a meal.   fluticasone-salmeterol 250-50 MCG/ACT Aepb Commonly known as: ADVAIR Inhale 1 puff into the lungs in the morning and at bedtime.   furosemide 20 MG tablet Commonly known as: LASIX Take 40 mg daily for 3 days and then take 20 mg daily What changed:  how much to take how to take this when to take this additional instructions   gabapentin 300 MG capsule Commonly known as: NEURONTIN Take 1 capsule (300 mg total) by mouth 3 (three) times daily.   hyoscyamine 0.125 MG SL tablet Commonly known as: LEVSIN SL Place 1 tablet (0.125 mg total) under the tongue every 4 (four) hours as needed. Take tablet every 4-6 hours for  abdominal pain and cramping What changed:  reasons to take this additional instructions   ipratropium-albuterol 0.5-2.5 (3) MG/3ML Soln Commonly known as: DUONEB USE 1 VIAL IN NEBULIZER EVERY 6 HOURS - And As Needed What changed: See the new instructions.   levocetirizine 5 MG tablet Commonly known as: XYZAL Take 1 tablet (5 mg total) by mouth every evening.   lidocaine 5 % Commonly known as: Lidoderm Place 1 patch onto the skin daily. Remove & Discard patch within 12 hours or as directed by MD What changed:  when to take this reasons to take this additional instructions   Mucus Relief 600 MG 12 hr tablet Generic drug: guaiFENesin Take 2 tablets (1,200 mg total) by mouth 2 (two) times daily for 5 days.   Nebulizer Misc 1 Act by Does not apply route daily. Dx J45.4   NONFORMULARY OR COMPOUNDED ITEM Transport chair   #1    dx leg and hip pain s/p fall   NONFORMULARY OR COMPOUNDED ITEM Rollator walker with seat    #1   dx gait abnormality   polyethylene glycol powder 17 GM/SCOOP powder Commonly known as: GLYCOLAX/MIRALAX Take 17 g by mouth daily as needed for mild constipation.   potassium chloride SA 20 MEQ tablet Commonly known as: KLOR-CON M Take 1 tablet (20 mEq total) by mouth daily.   predniSONE 10 MG (21) Tbpk tablet Commonly known as: STERAPRED UNI-PAK 21 TAB Take 6 tablets on day 1, 5 tablets on day 2, 4 tablets on  day 3 3 tablets on day 4, 2 tablets on day 5, 1 tablet on day 6 and then stop   thiamine 50 MG tablet Commonly known as: VITAMIN B-1 Take 1 tablet (50 mg total) by mouth daily.   triamcinolone cream 0.5 % Commonly known as: KENALOG APPLY 1 APPLICATION TOPICALLY TWICE A DAY What changed: See the new instructions.   VITAMIN B 12 PO Take 1 tablet by mouth daily.   Vitamin D (Ergocalciferol) 1.25 MG (50000 UNIT) Caps capsule Commonly known as: DRISDOL TAKE 1 CAPSULE EVERY 7 DAYS What changed: See the new instructions.   zinc gluconate 50 MG  tablet Take 1 tablet (50 mg total) by mouth daily.               Durable Medical Equipment  (From admission, onward)           Start     Ordered   03/19/22 1556  DME Oxygen  Once       Question Answer Comment  Length of Need 6 Months   Mode or (Route) Nasal cannula   Liters per Minute 2   Frequency Continuous (stationary and portable oxygen unit needed)   Oxygen delivery system Gas      03/19/22 1559            Follow-up [facility].   Contact information: [address] [place] [address] [phone]         Care, [facility] .   Specialty: Home Health Services Contact information: [place] [address] [phone]                Discharge Exam: Danley Danker Weights   03/12/22 2134  Weight: 69.9 kg   Vitals:   03/19/22 2000 03/19/22 2028  BP:    Pulse: 60 (!) 42  Resp:  14  Temp:    SpO2: 95% 96%   Examination: Physical Exam:  Constitutional: WN/WD elderly Caucasian female in no acute distress and did not complain of any dizziness this morning and walked well with therapy. Respiratory: Diminished to auscultation bilaterally with coarse breath sounds, no wheezing, rales, rhonchi or crackles. Normal respiratory effort and patient is not tachypenic. No accessory muscle use.  Wearing supplemental oxygen via nasal cannula Cardiovascular: RRR, no murmurs / rubs / gallops. S1 and S2 auscultated.  Trace lower extremity edema Abdomen: Soft, non-tender, non-distended.  Bowel sounds positive.  GU: Deferred. Musculoskeletal: No clubbing / cyanosis of digits/nails. No joint deformity upper and lower extremities.   Skin: No rashes, lesions, ulcers on limited skin evaluation. No induration; Warm and dry.  Neurologic: CN 2-12 grossly intact with no focal deficits.  Romberg sign and cerebellar reflexes not assessed.  Psychiatric: Normal judgment and insight.  Alert and oriented x 3. Normal mood and appropriate affect.   Condition at discharge: stable  The results of significant diagnostics from this hospitalization (including imaging, microbiology, ancillary and laboratory) are listed below for reference.   Imaging Studies: DG CHEST PORT 1 VIEW  Result Date: 03/19/2022 CLINICAL DATA:  Provided history: Shortness of breath. EXAM: PORTABLE CHEST 1 VIEW COMPARISON:  Prior chest radiographs 03/18/2022 and earlier. FINDINGS: A loop recorder device projects over the left chest. Cardiomegaly, unchanged. Aortic atherosclerosis. Progressive opacity at the left lung base. A small left pleural effusion may be present. Persistent, although improved, ill-defined opacities within the right lung base. No definite pleural effusion. No  evidence of pneumothorax. IMPRESSION: Opacities within the bilateral lung bases, improved on the right and progressed on the left, as compared to 03/18/2022. Findings may reflect atelectasis and/or pneumonia. A small left pleural effusion may be present. Cardiomegaly. Aortic Atherosclerosis (ICD10-I70.0). Electronically Signed   By: Kellie Simmering D.O.   On: 03/19/2022 08:37   DG CHEST PORT 1 VIEW  Result Date: 03/18/2022 CLINICAL DATA:  Shortness of breath EXAM: PORTABLE CHEST 1 VIEW COMPARISON:  Chest x-rays dated 02/15/2022 and 11/20/2020. Chest CT dated 03/08/2022. FINDINGS: Stable cardiomegaly. Stable to slightly improved opacities at the bilateral lung bases. Coarse interstitial markings bilaterally. No pleural effusion or pneumothorax is seen. Osseous structures about the chest are unremarkable. IMPRESSION: 1. Stable to slightly improved opacities at the bilateral lung bases, atelectasis versus pneumonia. 2. Stable cardiomegaly. Electronically Signed   By: Franki Cabot M.D.   On: 03/18/2022 08:05   DG CHEST PORT 1 VIEW  Result Date: 03/17/2022 CLINICAL DATA:  Sob; covid+ EXAM: PORTABLE CHEST - 1 VIEW COMPARISON:  03/16/2022  FINDINGS: Right middle lobe airspace disease is developing a somewhat more band like configuration. Left retrocardiac consolidation slightly increased. Stable cardiomegaly.  Aortic Atherosclerosis (ICD10-170.0). Blunting of left lateral costophrenic angle suggesting small effusion. Visualized bones unremarkable. Implanted monitor overlies the lower left chest. IMPRESSION: Developing right middle lobe and left retrocardiac infiltrates or atelectasis. Electronically Signed   By: Lucrezia Europe M.D.   On: 03/17/2022 09:44   DG CHEST PORT 1 VIEW  Result Date: 03/16/2022 CLINICAL DATA:  Shortness of breath.  COVID-19 positive EXAM: PORTABLE CHEST 1 VIEW COMPARISON:  03/15/2022 FINDINGS: Lungs are adequately inflated with slight worsening hazy opacification over the right middle lobe region and medial right apex. No effusion. Cardiomediastinal silhouette and remainder of the exam is unchanged. IMPRESSION: Slight worsening hazy opacification over the right middle lobe region and medial right apex likely multifocal infection. Electronically Signed   By: Marin Olp M.D.   On: 03/16/2022 10:20   DG CHEST PORT 1 VIEW  Result Date: 03/15/2022 CLINICAL DATA:  COVID. EXAM: PORTABLE CHEST 1 VIEW COMPARISON:  Chest x-ray from yesterday. FINDINGS: Unchanged mild cardiomegaly. Loop recorder again noted. Unchanged small left pleural effusion with left basilar atelectasis. The lungs are otherwise clear. No pneumothorax. No acute osseous abnormality. IMPRESSION: 1. Unchanged small left pleural effusion with left basilar atelectasis. Electronically Signed   By: Titus Dubin M.D.   On: 03/15/2022 08:11   DG CHEST PORT 1 VIEW  Result Date: 03/14/2022 CLINICAL DATA:  COVID EXAM: PORTABLE CHEST 1 VIEW COMPARISON:  Two days ago FINDINGS: Stable heart size and mediastinal contours. Implantable cardiac device. Artifact from support hardware. Hazy density at the left base from pleural fluid and atelectasis by recent CT. No  pneumothorax. IMPRESSION: Stable compared to 2 days prior. Mild left opacity from pleural fluid and atelectasis by recent chest CT. Electronically Signed   By: Jorje Guild M.D.   On: 03/14/2022 07:15   CT Head Wo Contrast  Result Date: 03/12/2022 CLINICAL DATA:  Mental status change, unknown cause. EXAM: CT HEAD WITHOUT CONTRAST TECHNIQUE: Contiguous axial images were obtained from the base of the skull through the vertex without intravenous contrast. RADIATION DOSE REDUCTION: This exam was performed according to the departmental dose-optimization program which includes automated exposure control, adjustment of the mA and/or kV according to patient size and/or use of iterative reconstruction technique. COMPARISON:  CT head dated July 16, 2021 FINDINGS: Brain: No evidence of acute infarction, hemorrhage, hydrocephalus, extra-axial collection  or mass lesion/mass effect. Small infarct of the left occipital lobe is unchanged. Advanced chronic microvascular ischemic changes of the periventricular and subcortical white matter. Vascular: No hyperdense vessel or unexpected calcification. Skull: Normal. Negative for fracture or focal lesion. Sinuses/Orbits: No acute finding. Other: None. IMPRESSION: 1. No acute intracranial abnormality. 2. Advanced chronic microvascular ischemic changes of the periventricular and subcortical white matter. Electronically Signed   By: Keane Police D.O.   On: 03/12/2022 17:59   DG Chest 2 View  Result Date: 03/12/2022 CLINICAL DATA:  Cough with altered mental status for 1 day. EXAM: CHEST - 2 VIEW COMPARISON:  Radiographs 02/20/2022 and 07/16/2021.  CT 03/08/2022. FINDINGS: 1522 hours. The heart size and mediastinal contours are stable with aortic atherosclerosis. Unchanged left greater than right pleural effusions with associated left basilar airspace disease. There is possible mild pulmonary edema. No pneumothorax or acute osseous abnormality. Telemetry leads overlie the chest.  IMPRESSION: Unchanged left greater than right pleural effusions and left basilar airspace disease. Possible mild pulmonary edema. Electronically Signed   By: Richardean Sale M.D.   On: 03/12/2022 15:44   CT Chest Wo Contrast  Result Date: 03/09/2022 CLINICAL DATA:  Dyspnea, chronic, unclear etiology CT 4/23 with opacity in lungs - f/u, also with DOE, wheeze, hypoxia Opacity noted on imaging study. Dyspnea on exertion. EXAM: CT CHEST WITHOUT CONTRAST TECHNIQUE: Multidetector CT imaging of the chest was performed following the standard protocol without IV contrast. RADIATION DOSE REDUCTION: This exam was performed according to the departmental dose-optimization program which includes automated exposure control, adjustment of the mA and/or kV according to patient size and/or use of iterative reconstruction technique. COMPARISON:  Radiograph 02/20/2022, CT 07/16/2021 FINDINGS: Cardiovascular: Moderate atherosclerosis of the thoracic aorta. Aortic tortuosity without aortic aneurysm. Mild cardiomegaly. There are coronary artery calcifications. Dilated central pulmonary artery at 3.1 cm. No pericardial effusion. Mediastinum/Nodes: Shotty mediastinal lymph nodes, none of which are enlarged by size criteria. No thyroid nodule. Decompressed esophagus. Lungs/Pleura: Small to moderate left and small right pleural effusion. There is associated compressive atelectasis primarily in the lower lobes. Moderate central bronchial thickening. Slight heterogeneous pulmonary parenchyma. Persistent left upper lobe ground-glass opacity measuring approximately 6 mm, series 8, image 40. There is a 2 mm solid component centrally. Adjacent 3 mm left upper lobe nodule series 8, image 39. There are multiple 2-3 mm right upper lobe nodules including series 8 images 45, 46, and 55. These are unchanged from prior exam. Scattered additional tiny pulmonary millimetric nodules are also seen. Multiple scattered calcified granulomas are benign, these  nodules need no specific imaging follow-up. New subpleural area of nodularity in the lingula measures approximately 7 x 8 mm, series 8, image 83. Adjacent linear opacities. Upper Abdomen: No acute upper abdominal findings. Musculoskeletal: T12 compression fracture has progressed from prior CT, prominent loss of height centrally with cortical buckling of the posterior cortex. Slight superior endplate compression deformities of T3 and T5. These are unchanged. Left chest wall loop recorder. There may be slight chest wall edema. IMPRESSION: 1. Small to moderate left and small right pleural effusions with associated compressive atelectasis. 2. Moderate central bronchial thickening. Heterogeneous pulmonary parenchyma, can be seen with small airways disease. 3. Persistent 6 mm left upper lobe ground-glass opacity with 2 mm solid component. Follow-up of this ground-glass nodule is recommended every 2 years until 5 years of stability is established. 4. New subpleural area of nodularity in the lingula measuring 7 x 8 mm with adjacent linear opacities. This may be infectious  or inflammatory, however recommend follow-up chest CT in 6-12 months is recommended to assess for stability for new nodule of this size. 5. Multiple additional tiny pulmonary nodules are stable from prior exam. 6. Cardiomegaly with coronary artery calcifications. Dilated central pulmonary artery suggesting pulmonary arterial hypertension. 7. Progressive T12 compression fracture since prior CT, now with significant loss of height. Additional chronic compression fractures of T3 and T5 are unchanged. Aortic Atherosclerosis (ICD10-I70.0). Electronically Signed   By: Keith Rake M.D.   On: 03/09/2022 19:15   DG Chest 2 View  Result Date: 02/20/2022 CLINICAL DATA:  Wheezing, shortness of breath and decreased oxygen at night. EXAM: CHEST - 2 VIEW COMPARISON:  07/16/2021 FINDINGS: Stable mild cardiac enlargement. Aortic atherosclerotic calcifications.  Loop recorder is identified in the projection of the left heart. Pulmonary vascular congestion is identified. There are small bilateral pleural effusions are identified, left greater than right. No airspace opacities. IMPRESSION: 1. Small bilateral pleural effusions, left greater than right. 2. Pulmonary vascular congestion. Electronically Signed   By: Kerby Moors M.D.   On: 02/20/2022 11:57    Microbiology: Results for orders placed or performed during the hospital encounter of 03/12/22  Resp panel by RT-PCR (RSV, Flu A&B, Covid) Anterior Nasal Swab     Status: Abnormal   Collection Time: 03/12/22  2:47 PM   Specimen: Anterior Nasal Swab  Result Value Ref Range Status   SARS Coronavirus 2 by RT PCR POSITIVE (A) NEGATIVE Final    Comment: (NOTE) SARS-CoV-2 target nucleic acids are DETECTED.  The SARS-CoV-2 RNA is generally detectable in upper respiratory specimens during the acute phase of infection. Positive results are indicative of the presence of the identified virus, but do not rule out bacterial infection or co-infection with other pathogens not detected by the test. Clinical correlation with patient history and other diagnostic information is necessary to determine patient infection status. The expected result is Negative.  Fact Sheet for Patients: EntrepreneurPulse.com.au  Fact Sheet for Healthcare Providers: IncredibleEmployment.be  This test is not yet approved or cleared by the [place] FDA and  has been authorized for detection and/or diagnosis of SARS-CoV-2 by FDA under an Emergency Use Authorization (EUA).  This EUA will remain in effect (meaning this test can be used) for the duration of  the COVID-19 declaration under Section 564(b)(1) of the A ct, 21 U.S.C. section 360bbb-3(b)(1), unless the authorization is terminated or revoked sooner.     Influenza A by PCR NEGATIVE NEGATIVE Final   Influenza B by PCR NEGATIVE NEGATIVE  Final    Comment: (NOTE) The Xpert Xpress SARS-CoV-2/FLU/RSV plus assay is intended as an aid in the diagnosis of influenza from Nasopharyngeal swab specimens and should not be used as a sole basis for treatment. Nasal washings and aspirates are unacceptable for Xpert Xpress SARS-CoV-2/FLU/RSV testing.  Fact Sheet for Patients: EntrepreneurPulse.com.au  Fact Sheet for Healthcare Providers: IncredibleEmployment.be  This test is not yet approved or cleared by the [place] FDA and has been authorized for detection and/or diagnosis of SARS-CoV-2 by FDA under an Emergency Use Authorization (EUA). This EUA will remain in effect (meaning this test can be used) for the duration of the COVID-19 declaration under Section 564(b)(1) of the Act, 21 U.S.C. section 360bbb-3(b)(1), unless the authorization is terminated or revoked.     Resp Syncytial Virus by PCR NEGATIVE NEGATIVE Final    Comment: (NOTE) Fact Sheet for Patients: EntrepreneurPulse.com.au  Fact Sheet for Healthcare Providers: IncredibleEmployment.be  This test is not yet approved  or cleared by the [place] and has been authorized for detection and/or diagnosis of SARS-CoV-2 by FDA under an Emergency Use Authorization (EUA). This EUA will remain in effect (meaning this test can be used) for the duration of the COVID-19 declaration under Section 564(b)(1) of the Act, 21 U.S.C. section 360bbb-3(b)(1), unless the authorization is terminated or revoked.  Performed at [facility], [place] [address], [place], [place] [address]   Urine Culture     Status: Abnormal   Collection Time: 03/12/22  4:48 PM   Specimen: In/Out Cath Urine  Result Value Ref Range Status   Specimen Description IN/OUT CATH URINE  Final   Special Requests   Final    NONE Performed at [facility], [place] [address] [place], [place] [address]    Culture (A)  Final     >=100,000 COLONIES/mL DIPHTHEROIDS(CORYNEBACTERIUM SPECIES) Standardized susceptibility testing for this organism is not available. 20,000 COLONIES/mL ENTEROCOCCUS FAECALIS    Report Status 03/15/2022 FINAL  Final   Organism ID, Bacteria ENTEROCOCCUS FAECALIS (A)  Final      Susceptibility   Enterococcus faecalis - MIC*    AMPICILLIN <=2 SENSITIVE Sensitive     NITROFURANTOIN <=16 SENSITIVE Sensitive     VANCOMYCIN 2 SENSITIVE Sensitive     * 20,000 COLONIES/mL ENTEROCOCCUS FAECALIS   Labs: CBC: Recent Labs  Lab 03/15/22 0330 03/16/22 0300 03/17/22 0252 03/18/22 0448 03/19/22 0442  WBC 6.3 5.7 7.6 9.4 9.5  NEUTROABS 4.8 4.7 6.0 7.8* 7.9*  HGB 11.0* 11.5* 11.7* 11.9* 11.4*  HCT 34.5* 34.4* 35.5* 35.9* 36.0  MCV 100.6* 98.0 97.3 96.5 100.3*  PLT 133* 124* 123* 137* 132*   Basic Metabolic Panel: Recent Labs  Lab 03/15/22 0330 03/16/22 0300 03/17/22 0252 03/18/22 0448 03/19/22 0442  NA 135 134* 135 133* 133*  K 4.5 4.6 4.7 4.7 4.5  CL 99 98 100 98 103  CO2 '28 27 30 28 25  '$ GLUCOSE 133* 218* 147* 189* 161*  BUN 35* 42* 39* 46* 43*  CREATININE 1.47* 1.33* 1.25* 1.46* 1.29*  CALCIUM 8.6* 8.8* 8.9 8.9 8.4*  MG 2.1 2.1 2.1 2.1 2.1  PHOS 3.6 3.3 3.3 3.0 3.4   Liver Function Tests: Recent Labs  Lab 03/15/22 0330 03/16/22 0300 03/17/22 0252 03/18/22 0448 03/19/22 0442  AST '21 20 16 15 16  '$ ALT '13 14 13 12 13  '$ ALKPHOS 61 61 57 52 55  BILITOT 0.4 0.5 0.4 0.4 0.3  PROT 5.2* 5.4* 5.2* 5.1* 4.6*  ALBUMIN 2.8* 3.1* 2.8* 2.8* 2.5*   CBG: Recent Labs  Lab 03/18/22 2142 03/19/22 0822 03/19/22 1301 03/19/22 1605 03/19/22 2158  GLUCAP 345* 122* 117* 146* 83   Discharge time spent: greater than 30 minutes.  Signed: Raiford Noble, DO [facility] 03/19/2022

## 2022-03-19 NOTE — Progress Notes (Signed)
Physical Therapy Treatment Patient Details Name: Penny Miller MRN: [id number] DOB: 03-03-1932 Today's Date: 03/19/2022   History of Present Illness Pt is a 88 y/o female admitted for acute encephalopathy, COVID-19 infection and mild asthma exacerbation on 03/12/22. Pt has had orthostatic hypotension during admission. PMH: HTN, CVA, a fib, CKD, DM, spinal stenosis.    PT Comments    PT asked to evaluate pt for vestibular evaluation.  Vestibular workup was negative.  She had no c/o dizziness during session.  Pt did have orthostatic hypotension but was asymptomatic.   Pt required min A for bed mobility and min guard to ambulate 45' in room with RW.  Her son was present and reports he is with her at home and can provide min A.  She did fatigue easily. She was on 3 L O2 with sats 93% or >.  At arrival pt was on bed pan trying to have BM.  Reports she asked to go to bathroom but was told she had to do bed pan.  Talked with pt and told that she could do [facility] with assist.  Informed tech that was in room at end of session that pt is able and should transfer to [facility] via pivot for next BM attempt. Pt was unable to have BM during PT session while in bathroom.    Recommendations for follow up therapy are one component of a multi-disciplinary discharge planning process, led by the attending physician.  Recommendations may be updated based on patient status, additional functional criteria and insurance authorization.  Follow Up Recommendations  Home health PT     Assistance Recommended at Discharge Frequent or constant Supervision/Assistance  Patient can return home with the following A little help with walking and/or transfers;A little help with bathing/dressing/bathroom;Assistance with cooking/housework;Help with stairs or ramp for entrance;Assist for transportation   Equipment Recommendations  None recommended by PT    Recommendations for Other Services       Precautions / Restrictions  Precautions Precautions: Fall;Other (comment) Precaution Comments: orthostatic hypotension     Mobility  Bed Mobility Overal bed mobility: Needs Assistance Bed Mobility: Supine to Sit, Sit to Supine     Supine to sit: Min assist, HOB elevated Sit to supine: Min assist   General bed mobility comments: Light min A to lift trunk up and for feet back to bed    Transfers Overall transfer level: Needs assistance Equipment used: Rolling walker (2 wheels) Transfers: Sit to/from Stand Sit to Stand: Min assist           General transfer comment: Sit to stand from bed x 2, from toilet x 1 with min A and cues for hand placement    Ambulation/Gait Ambulation/Gait assistance: Min guard Gait Distance (Feet): 45 Feet (15' then 45') Assistive device: Rolling walker (2 wheels) Gait Pattern/deviations: Step-through pattern, Decreased stride length Gait velocity: decr     General Gait Details: min guard for balance and cues for RW and turns with lines; ambulated to bathroom to attempt to have BM but unable, then ambulated in room   Stairs             Wheelchair Mobility    Modified Rankin (Stroke Patients Only)       Balance Overall balance assessment: Needs assistance Sitting-balance support: No upper extremity supported, Feet supported Sitting balance-Leahy Scale: Good     Standing balance support: Bilateral upper extremity supported, During functional activity Standing balance-Leahy Scale: Poor Standing balance comment: reliant on RW for support  Cognition Arousal/Alertness: Awake/alert Behavior During Therapy: WFL for tasks assessed/performed Overall Cognitive Status: Impaired/Different from baseline                                          Exercises      General Comments   Vestibular Screen: Pt reports symptoms started with admission and generally occur when she stands up.  Denies dizziness in bed.   Reports dizziness has improved greatly today.  Pt has had orthostatic hypotension during admission   Tracking, gaze stabilization, head thrust, and head turns: all normal, no nystagmus, no dizziness  Kohl's and Horizontal Roll: negative for BPPV, no dizziness  Orthostatic BP: Supine154/77 Sit 140/92 Stand 101/55 (ASYMPTOMATIC) Stand 3 min 109/66 (ASYMPTOMATIC) Supine 135/64     Pertinent Vitals/Pain Pain Assessment Pain Assessment: No/denies pain    Home Living                          Prior Function            PT Goals (current goals can now be found in the care plan section) Progress towards PT goals: Progressing toward goals    Frequency    Min 3X/week      PT Plan Current plan remains appropriate    Co-evaluation              AM-PAC PT "6 Clicks" Mobility   Outcome Measure  Help needed turning from your back to your side while in a flat bed without using bedrails?: A Little Help needed moving from lying on your back to sitting on the side of a flat bed without using bedrails?: A Little Help needed moving to and from a bed to a chair (including a wheelchair)?: A Little Help needed standing up from a chair using your arms (e.g., wheelchair or bedside chair)?: A Little Help needed to walk in hospital room?: A Little Help needed climbing 3-5 steps with a railing? : A Lot 6 Click Score: 17    End of Session Equipment Utilized During Treatment: Oxygen;Gait belt Activity Tolerance: Patient tolerated treatment well Patient left: in bed;with call bell/phone within reach;with bed alarm set;with family/visitor present Nurse Communication: Mobility status PT Visit Diagnosis: Unsteadiness on feet (R26.81);Other abnormalities of gait and mobility (R26.89);Muscle weakness (generalized) (M62.81)     Time: 7408-1448 PT Time Calculation (min) (ACUTE ONLY): 37 min  Charges:  $Gait Training: 8-22 mins $Therapeutic Activity: 8-22 mins                      Abran Richard, PT Acute Rehab [facility] [place] 03/19/2022, 1:47 PM

## 2022-03-19 NOTE — Progress Notes (Signed)
Pt and pt son at bedside refusing discharge. Pt states that she is light headed upon ambulation. RN assessed pt at bedside. Sam, night shift RN and Desiray, Agricultural consultant notified of situation.

## 2022-03-19 NOTE — Progress Notes (Signed)
Called by nursing, family declines discharge.  Per nursing, when she stands she gets so lightheaded she almost collapses. O2 was 75 when we put the monitor back on her. We got her on 5L now. He BP low, her pulse stays around 40-50 in A-Fib. According to the son she was supposed to get a Pacer placed here before she god Covid.  She's received multiple boluses in the last 24 hours and still gets grossly dizzy/lightheaded and desats upon standing. She's 93% on 5LNC now, positioned well   Discharge held.  IV fluids started.  Orthostatic vitals in a.m.  Chest x-ray done earlier today reviewed.  IMPRESSION: Opacities within the bilateral lung bases, improved on the right and progressed on the left, as compared to 03/18/2022. Findings may reflect atelectasis and/or pneumonia.   A small left pleural effusion may be present.   Cardiomegaly.

## 2022-03-19 NOTE — TOC Transition Note (Addendum)
Transition of Care [facility]) - CM/SW Discharge Note   Patient Details  Name: Penny Miller MRN: [id number] Date of Birth: 11/24/1931  Transition of Care [facility]) CM/SW Contact:  Carles Collet, RN Phone Number: 03/19/2022, 2:17 PM   Clinical Narrative:     Damaris Schooner w patient's son, he deferred to patient. Spoke w patient and she would like to have [facility] PT services resume through [place]. Patient has sats from 12/16, verified with Aneth that she will need to have new sats within 48 hours of DC, and requested PT/ RN to document new ambulatory sats.  Referral made to [employer] for home oxygen, order will be fulfilled pending new sats and order.  Son will provide transportation home. From [facility] standpoint, patient may leave once home O2 delivered to room, and ambulatory sats are documented  -16:30 notified [employer] that patient has a DC order    Final next level of care: Home w Home Health Services Barriers to Discharge: No Barriers Identified   Patient Goals and CMS Choice Patient states their goals for this hospitalization and ongoing recovery are:: to go home CMS Medicare.gov Compare Post Acute Care list provided to:: Patient Choice offered to / list presented to : Patient  Discharge Placement                       Discharge Plan and Services                DME Arranged: Oxygen DME Agency: [employer] Date DME Agency Contacted: 03/19/22 Time DME Agency Contacted: [phone] Representative spoke with at DME Agency: Brenton Grills [place]: PT [place]: [place] Date [place]: 03/19/22 Time [place]: [id number] Representative spoke with at [place]: Tommi Rumps  Social Determinants of Health ([place]) Interventions     Readmission Risk Interventions     No data to display

## 2022-03-19 NOTE — Telephone Encounter (Signed)
Elena from [employer] called about order for back and knee braces and that they are missing recent office notes for it and the reason for it. Call back is [phone].

## 2022-03-19 NOTE — Progress Notes (Signed)
SATURATION QUALIFICATIONS: (This note is used to comply with regulatory documentation for home oxygen)  Patient Saturations on Room Air at Rest = 92%  Patient Saturations on Room Air while Ambulating = 85%  Patient Saturations on 5 Liters of oxygen while Ambulating = 97%

## 2022-03-20 DIAGNOSIS — I10 Essential (primary) hypertension: Secondary | ICD-10-CM | POA: Diagnosis not present

## 2022-03-20 DIAGNOSIS — E1142 Type 2 diabetes mellitus with diabetic polyneuropathy: Secondary | ICD-10-CM | POA: Diagnosis not present

## 2022-03-20 DIAGNOSIS — G9340 Encephalopathy, unspecified: Secondary | ICD-10-CM | POA: Diagnosis not present

## 2022-03-20 DIAGNOSIS — Z8673 Personal history of transient ischemic attack (TIA), and cerebral infarction without residual deficits: Secondary | ICD-10-CM | POA: Diagnosis not present

## 2022-03-20 LAB — GLUCOSE, CAPILLARY
Glucose-Capillary: 96 mg/dL (ref 70–99)
Glucose-Capillary: 172 mg/dL — ABNORMAL HIGH (ref 70–99)
Glucose-Capillary: 261 mg/dL — ABNORMAL HIGH (ref 70–99)
Glucose-Capillary: 181 mg/dL — ABNORMAL HIGH (ref 70–99)

## 2022-03-20 MED ORDER — CITALOPRAM HYDROBROMIDE 20 MG PO TABS
10.0000 mg | ORAL_TABLET | Freq: Every day | ORAL | Status: DC
  Administered 2022-03-20 – 2022-03-21 (×2): 10 mg via ORAL
  Filled 2022-03-20 (×2): qty 1

## 2022-03-20 NOTE — Telephone Encounter (Signed)
Form sent again with last OV note.

## 2022-03-20 NOTE — TOC Progression Note (Signed)
Transition of Care [facility]) - Progression Note    Patient Details  Name: Penny Miller MRN: [id number] Date of Birth: Apr 28, 1931  Transition of Care [facility]) CM/SW [place], LCSW Phone Number: 03/20/2022, 2:27 PM  Clinical Narrative:    Patient's son requesting SNF placement. CSW sent referrals out and provided bed offers (COVID status is a barrier). He is in agreement with patient going to [facility]. CSW went over insurance information with him. Will arrange bed for tomorrow via ptar.    Planned Disposition: Skilled Nursing Facility Barriers to Discharge: SNF Covid  Expected Discharge Plan and Services In-house Referral: Clinical Social Work       Expected Discharge Date: 03/19/22               DME Arranged: Oxygen DME Agency: [employer] Date DME Agency Contacted: 03/19/22 Time DME Agency Contacted: [phone] Representative spoke with at DME Agency: [place]: PT [place]: [place] Date [place]: 03/19/22 Time [place]: [id number] Representative spoke with at [place]: [place] Determinants of Health ([place]) Interventions SDOH Screenings   Food Insecurity: No Food Insecurity (08/30/2020)  Housing: Low Risk  (08/30/2020)  Transportation Needs: No Transportation Needs (08/30/2020)  Alcohol Screen: Low Risk  (11/05/2021)  Depression (PHQ2-9): Low Risk  (03/04/2022)  Recent Concern: Depression (PHQ2-9) - Medium Risk (02/20/2022)  Financial Resource Strain: Low Risk  (08/30/2020)  Physical Activity: Inactive (08/30/2020)  Social Connections: Moderately Integrated (08/30/2020)  Stress: No Stress Concern Present (08/30/2020)  Tobacco Use: Medium Risk (03/12/2022)    Readmission Risk Interventions     No data to display

## 2022-03-20 NOTE — Consult Note (Addendum)
Cardiology Consultation   Patient ID: Penny Miller MRN: [id number]; DOB: 1931/08/11  Admit date: 03/12/2022 Date of Consult: 03/20/2022  PCP:  [name], MD   [facility] Cardiologist:  Minus Breeding, MD  Electrophysiologist:  Cristopher Peru, MD       Patient Profile:   Penny Miller is a 88 y.o. 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 Sloan Leiter.  History of Present Illness:   Penny Miller with known hx of bradycardia, back in 2021, he atenolol stopped 2/2 pauses (without symptoms/syncope). June 2022 had a syncopal episode while her loop was still working without brady/pause episodes, carotids with mild plaque.  I saw her in march 2023, in review of that note, seems to have long hx of some dizziness, feeling lightheaded especially when she bends forward/down, and infrequently Penny Miller get a little lightheaded though reported warning and faded off quickly   She saw [name] a couple times since then with reports of slow pulse by pulse ox and associated symptoms of feeling poorly, planned for monitoring that did note when in AFib (5% burden) she had bradycardic rates to the 30's.  He offered her PPM (and perhaps subsequent AAD for her AFib), she was going to give it some thought and call to schedule if decide to pursue pacing.  She did get scheduled for Nov though cancelled due to illness.  She was admitted 03/12/22 with AMS/encephalopathy, HRs in the ER 50's-60's, labs largely unremarkable though was + for COVID, CT head without acute abnormality.  Chest x-ray with stable left greater than right pleural effusion and left basilar airspace disease.  Patient started on molnupiravir, Solu-Medrol, DuoNebs   Through her stay with some c/o dizziness when standing/ambulating and noted to  desaturate. Continued management of her COVID, PT saw her, did vestibular testing was negative, orthostatics mildly positive and treated with IVF Yesterday felt medically stable for d/c with a weak spell during ambulation and again desaturation, HR described as stays on the low side Today  Supine 159/60 (85)  Sitting 137/66 (85)  Standing 114/53 (68)  Standing after 3 min 129/58 (78)   Patient/family concerns about her HR and prior recommendations for PPM and her weakness, dizzy spells. EP was asked to weigh in.  LABS (most recent) K+ 4.5 Mag 2.1 BUN/Creat 43/1.29 WBC 9.5 H/H 11/36 Plts 133  On no nodal blocking agents here or at home On Eliquis 2.'5mg'$  BID    Past Medical History:  Diagnosis Date   Allergic rhinitis, cause unspecified    Anxiety state, unspecified    Arthritis    Colon polyp    Diaphragmatic hernia without mention of obstruction or gangrene    Diverticulosis of colon (without mention of hemorrhage)    Female stress incontinence    HTN (hypertension)    Insomnia, unspecified    Interstitial cystitis    surgery 02/01/2013   Irritable bowel syndrome    Memory loss    Other and unspecified hyperlipidemia    Other premature beats    Polyneuropathy in diabetes(357.2)    Postmenopausal bleeding    Proteinuria    Rectal ulcer    Skin cancer    Stroke ([place]) 2018   Type II or unspecified type diabetes mellitus with neurological manifestations, not stated as uncontrolled(250.60)    Unspecified  essential hypertension    Viral warts, unspecified     Past Surgical History:  Procedure Laterality Date   ABDOMINAL HYSTERECTOMY  1977   [facility]   CATARACT EXTRACTION, BILATERAL  02/10/2006   Dr.Groat    COLONOSCOPY  07/17/2004   polypectomy   CYSTOSCOPY  1987   Dr.Bradley   LOOP RECORDER INSERTION N/A 07/31/2016   Procedure: Loop Recorder Insertion;  Surgeon: [name], MD;  Location: [facility];  Service: Cardiovascular;   Laterality: N/A;   TEE WITHOUT CARDIOVERSION N/A 07/30/2016   Procedure: TRANSESOPHAGEAL ECHOCARDIOGRAM (TEE);  Surgeon: [name], MD;  Location: [facility];  Service: Cardiovascular;  Laterality: N/A;   TRIGGER FINGER RELEASE  1990   [place]  05/13/16   urinary bladder      surgery 02/01/2013 to stretch bladder stem     Home Medications:  Prior to Admission medications   Medication Sig Start Date End Date Taking? Authorizing Provider  acetaminophen (TYLENOL) 500 MG tablet Take 500 mg by mouth every 6 (six) hours as needed (pain).   Yes [provider]  amLODipine (NORVASC) 2.5 MG tablet Take 2.5 mg by mouth at bedtime. 09/06/21  Yes [provider]  apixaban (ELIQUIS) 2.5 MG TABS tablet Take 1 tablet (2.5 mg total) by mouth 2 (two) times daily. 09/28/21  Yes Minus Breeding, MD  atorvastatin (LIPITOR) 40 MG tablet TAKE 1 TABLET AT BEDTIME Patient taking differently: Take 40 mg by mouth daily. 01/04/22  Yes [name], MD  budesonide (PULMICORT) 0.5 MG/2ML nebulizer solution USE 1 VIAL  IN  NEBULIZER TWICE  DAILY - - Rinse Mouth After Treatment Patient taking differently: Take 0.5 mg by nebulization daily as needed (for shortness of breath). 01/25/22  Yes [name], MD  citalopram (CELEXA) 10 MG tablet TAKE 1 TABLET DAILY Patient taking differently: Take 10 mg by mouth daily. 02/18/22  Yes [name], MD  Cyanocobalamin (VITAMIN B 12 PO) Take 1 tablet by mouth daily.   Yes [provider]  famotidine (PEPCID) 20 MG tablet Take one tablet once to twice a day Patient taking differently: Take 20 mg by mouth daily. 02/05/22  Yes Armbruster, Carlota Raspberry, MD  ferrous sulfate 325 (65 FE) MG tablet Take 1 tablet (325 mg total) by mouth 2 (two) times daily with a meal. 02/05/22  Yes Armbruster, Carlota Raspberry, MD  fluticasone-salmeterol (ADVAIR) 250-50 MCG/ACT AEPB Inhale 1 puff into the lungs in the morning and at bedtime. 02/20/22  Yes  Burns, Claudina Lick, MD  furosemide (LASIX) 20 MG tablet Take 40 mg daily for 3 days and then take 20 mg daily Patient taking differently: Take 20 mg by mouth daily. 02/25/22  Yes Burns, Claudina Lick, MD  gabapentin (NEURONTIN) 300 MG capsule Take 1 capsule (300 mg total) by mouth 3 (three) times daily. 08/14/21  Yes [name], MD  levocetirizine (XYZAL) 5 MG tablet Take 1 tablet (5 mg total) by mouth every evening. 01/23/22  Yes [name], MD  lidocaine (LIDODERM) 5 % Place 1 patch onto the skin daily. Remove & Discard patch within 12 hours or as directed by MD Patient taking differently: Place 1 patch onto the skin daily as needed (For back pain). 07/23/21  Yes Biagio Borg, MD  potassium chloride SA (KLOR-CON M) 20 MEQ tablet Take 1 tablet (20 mEq total) by mouth daily. 02/25/22  Yes Binnie Rail, MD  predniSONE (STERAPRED UNI-PAK 21 TAB) 10 MG (21) TBPK tablet Take 6 tablets on day 1, 5 tablets on day 2, 4 tablets on day 3 3 tablets on day 4, 2 tablets on day 5, 1 tablet on day 6 and then stop 03/19/22  Yes Sheikh, Omair Latif, DO  thiamine (VITAMIN B-1) 50 MG tablet Take 1 tablet (50 mg total) by mouth daily. 01/30/22  Yes [name], MD  triamcinolone cream (KENALOG) 0.5 % APPLY 1 APPLICATION TOPICALLY TWICE A DAY Patient taking differently: Apply 1 Application topically daily as needed (Rash). 02/20/21  Yes [name], MD  Vitamin D, Ergocalciferol, (DRISDOL) 1.25 MG (50000 UNIT) CAPS capsule TAKE 1 CAPSULE EVERY 7 DAYS Patient taking differently: Take 50,000 Units by mouth every 7 (seven) days. 01/18/22  Yes [name], MD  zinc gluconate 50 MG tablet Take 1 tablet (50 mg total) by mouth daily. 01/30/22  Yes [name], MD  ACCU-CHEK AVIVA PLUS test strip USE TO TEST BLOOD SUGAR TWICE DAILY. 09/11/18   Reed, [name] L, DO  guaiFENesin (MUCINEX) 600 MG 12 hr tablet Take 2 tablets (1,200 mg total) by mouth 2 (two) times daily for 5 days. 03/19/22 03/24/22  Raiford Noble  Latif, DO  hyoscyamine (LEVSIN SL) 0.125 MG SL tablet Place 1 tablet (0.125 mg total) under the tongue every 4 (four) hours as needed. Take tablet every 4-6 hours for abdominal pain and cramping Patient taking differently: Place 0.125 mg under the tongue every 4 (four) hours as needed for cramping. 03/23/20   Levin Erp, PA  ipratropium-albuterol (DUONEB) 0.5-2.5 (3) MG/3ML SOLN USE 1 VIAL IN NEBULIZER EVERY 6 HOURS - And As Needed Patient taking differently: Inhale 3 mLs into the lungs every 6 (six) hours as needed (for shortness of breath). 01/25/22   [name], MD  Lancets (ACCU-CHEK MULTICLIX) lancets Use to test blood sugar twice daily. E11.29 07/04/17   [name], DO  Nebulizer MISC 1 Act by Does not apply route daily. Dx J45.4 03/04/22   Binnie Rail, MD  NONFORMULARY OR COMPOUNDED ITEM Transport chair   #1    dx leg and hip pain s/p fall 11/10/20   Carollee Herter, Alferd Apa, DO  NONFORMULARY OR COMPOUNDED ITEM Rollator walker with seat    #1   dx gait abnormality 11/10/20   Carollee Herter, Alferd Apa, DO  polyethylene glycol powder (GLYCOLAX/MIRALAX) 17 GM/SCOOP powder Take 17 g by mouth daily as needed for mild constipation. 03/19/22   Kerney Elbe, DO    Inpatient Medications: Scheduled Meds:  amLODipine  2.5 mg Oral QHS   apixaban  2.5 mg Oral BID   atorvastatin  40 mg Oral QHS   citalopram  10 mg Oral Daily   gabapentin  300 mg Oral TID   guaiFENesin  1,200 mg Oral BID   insulin aspart  0-9 Units Subcutaneous TID WC   insulin aspart  4 Units Subcutaneous TID WC   methylPREDNISolone (SOLU-MEDROL) injection  40 mg Intravenous Daily   mometasone-formoterol  2 puff Inhalation BID   sodium chloride flush  3 mL Intravenous Q12H   thiamine  50 mg Oral Daily   zinc sulfate  220 mg Oral Daily   Continuous Infusions:  PRN Meds: acetaminophen **OR** acetaminophen, albuterol, chlorpheniramine-HYDROcodone, polyethylene glycol  Allergies:    Allergies  Allergen  Reactions   Jardiance [Empagliflozin] Diarrhea   Metformin And Related Diarrhea   Codeine Nausea And Vomiting   Metoprolol Nausea  And Vomiting   Relafen [Nabumetone] Nausea And Vomiting   Septra [Sulfamethoxazole-Trimethoprim] Nausea And Vomiting   Tetracycline Nausea And Vomiting   Adhesive [Tape] Rash and Other (See Comments)    Tears the skin also   Ceclor [Cefaclor] Other (See Comments)    Unknown reaction; not recalled   Cefaclor Other (See Comments)    Unknown reaction   Codeine Nausea And Vomiting   Jardiance [Empagliflozin] Diarrhea   Lopid [Gemfibrozil] Other (See Comments)    Unknown reaction; not recalled   Lopid [Gemfibrozil] Other (See Comments)    Unknown reaction   Lopressor [Metoprolol Tartrate] Nausea And Vomiting   Metformin And Related Diarrhea   Morphine And Related Other (See Comments)    Passed out   Morphine And Related Other (See Comments)    Passed out   Niacin Nausea And Vomiting    Unknown reaction Unknown reaction; not recalled   Niacin And Related Other (See Comments)    Unknown reaction; not recalled   Niacin And Related Other (See Comments)    Unknown reaction   Relafen [Nabumetone] Nausea And Vomiting   Septra [Sulfamethoxazole-Trimethoprim] Nausea And Vomiting   Tape Rash and Other (See Comments)    Tears skin   Tetracyclines & Related Nausea And Vomiting   Toprol Xl [Metoprolol Tartrate] Nausea And Vomiting   Toprol Xl [Metoprolol] Nausea And Vomiting    Social History:   Social History   Socioeconomic History   Marital status: Widowed    Spouse name: Not on file   Number of children: 1   Years of education: Not on file   Highest education level: Not on file  Occupational History   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   Tobacco Use   Smoking status: Former    Packs/day: 0.25    Years: 62.00    Total pack years: 15.50    Types: Cigarettes    Quit date: 04/02/2011    Years since quitting: 10.9   Smokeless  tobacco: Never   Tobacco comments:    stopped consistently smoking in 2012 but still smokes occasionally (one pack per week)  Vaping Use   Vaping Use: Never used  Substance and Sexual Activity   Alcohol use: No   Drug use: No   Sexual activity: Not Currently  Other Topics Concern   Not on file  Social History Narrative   Ophthalmologist-Dr.Gould and Dr.Groat   Podiatrist- Dr.Tuchman   Dermatologist- Dr.Lupton   Urologist- Dr. Lawerance Bach    Lives with her son.    Social Determinants of Health   Financial Resource Strain: Low Risk  (08/30/2020)   Overall Financial Resource Strain (CARDIA)    Difficulty of Paying Living Expenses: Not hard at all  Food Insecurity: No Food Insecurity (08/30/2020)   Hunger Vital Sign    Worried About Running Out of Food in the Last Year: Never true    Ran Out of Food in the Last Year: Never true  Transportation Needs: No Transportation Needs (08/30/2020)   PRAPARE - Hydrologist (Medical): No    Lack of Transportation (Non-Medical): No  Physical Activity: Inactive (08/30/2020)   Exercise Vital Sign    Days of Exercise per Week: 0 days    Minutes of Exercise per Session: 0 min  Stress: No Stress Concern Present (08/30/2020)   [place]    Feeling of Stress : Not at all  Social Connections:  Moderately Integrated (08/30/2020)   Social Connection and Isolation Panel [NHANES]    Frequency of Communication with Friends and Family: More than three times a week    Frequency of Social Gatherings with Friends and Family: Three times a week    Attends Religious Services: More than 4 times per year    Active Member of Clubs or Organizations: No    Attends Archivist Meetings: More than 4 times per year    Marital Status: Widowed  Intimate Partner Violence: Not At Risk (08/30/2020)   Humiliation, Afraid, Rape, and Kick questionnaire    Fear of Current or Ex-Partner:  No    Emotionally Abused: No    Physically Abused: No    Sexually Abused: No    Family History:   Family History  Problem Relation Age of Onset   Pancreatic cancer Mother 46   Heart attack Father    Bladder Cancer Father    Hypertension Sister    Diabetes Sister    COPD Sister    Hypertension Son    Diabetes Brother    Heart disease Brother    Hypertension Brother    Diabetes Brother    Heart disease Brother 50       Myocardial Infarction    Heart attack Brother      ROS:  Please see the history of present illness.  All other ROS reviewed and negative.     Physical Exam/Data:   Vitals:   03/20/22 1223 03/20/22 1300 03/20/22 1321 03/20/22 1432  BP: (!) 152/61     Pulse:  60 (!) 53 (!) 56  Resp:  '15 15 17  '$ Temp:      TempSrc:      SpO2:  99% 100%   Weight:      Height:        Intake/Output Summary (Last 24 hours) at 03/20/2022 1543 Last data filed at 03/20/2022 1518 Gross per 24 hour  Intake 1428.83 ml  Output 600 ml  Net 828.83 ml      03/12/2022    9:34 PM 03/04/2022    9:04 AM 02/25/2022    1:44 PM  Last 3 Weights  Weight (lbs) 154 lb 1.6 oz 154 lb 161 lb 3.2 oz  Weight (kg) 69.9 kg 69.854 kg 73.12 kg     Body mass index is 24.87 kg/m.  General:  Well nourished, well developed, in no acute distress HEENT: normal Neck: no JVD Vascular: No carotid bruits Cardiac:  irreg-irreg; no murmurs, gallops or rubs Lungs:  diminished at the bases, no wheezing, rhonchi or rales  Abd: soft, non-tender Ext: no edema Musculoskeletal:  No deformities Skin: warm and dry  Neuro:  no gross focal motor abnormalities noted Psych:  Normal affect   EKG:  The EKG was personally reviewed and demonstrates:    Afib 51bpm, narrow QRS  Telemetry:  Telemetry was personally reviewed and demonstrates:    AFib 40's-70's, she has some beat-beat slowing, vast majority are 1-2 seconds, none as long as 3 seconds   Relevant CV Studies:  09/29/20: Carotid US Summary:  Right  Carotid: Velocities in the right ICA are consistent with a 1-39%  stenosis.                 Non-hemodynamically significant plaque <50% noted in the  CCA.   Left Carotid: Velocities in the left ICA are consistent with a 1-39%  stenosis.  Non-hemodynamically significant plaque <50% noted in the  CCA.   Vertebrals:  Bilateral vertebral arteries demonstrate antegrade flow.  Subclavians: Right subclavian artery was stenotic. Right subclavian artery  flow               was disturbed. Normal flow hemodynamics were seen in the left               subclavian artery.    11/19/2018: TTE IMPRESSIONS   1. The left ventricle has normal systolic function with an ejection  fraction of 60-65%. The cavity size was normal. Left ventricular diastolic  Doppler parameters are consistent with pseudonormalization.   2. The right ventricle has normal systolic function. The cavity was  normal. There is no increase in right ventricular wall thickness. Right  ventricular systolic pressure is moderately elevated with an estimated  pressure of 39.4 mmHg.   3. Left atrial size was mild-moderately dilated.   4. Right atrial size was mildly dilated.   5. No evidence of mitral valve stenosis.   6. The tricuspid valve is grossly normal.   7. No stenosis of the aortic valve.   8. The aorta is normal unless otherwise noted.   9. The aortic root and ascending aorta are normal in size and structure.  10. Pulmonary hypertension is moderate.  11. The atrial septum is grossly normal.   Laboratory Data:  High Sensitivity Troponin:  No results for input(s): "TROPONINIHS" in the last 720 hours.   Chemistry Recent Labs  Lab 03/17/22 0252 03/18/22 0448 03/19/22 0442  NA 135 133* 133*  K 4.7 4.7 4.5  CL 100 98 103  CO2 '30 28 25  '$ GLUCOSE 147* 189* 161*  BUN 39* 46* 43*  CREATININE 1.25* 1.46* 1.29*  CALCIUM 8.9 8.9 8.4*  MG 2.1 2.1 2.1  GFRNONAA 41* 34* 40*  ANIONGAP '5 7 5    '$ Recent Labs  Lab  03/17/22 0252 03/18/22 0448 03/19/22 0442  PROT 5.2* 5.1* 4.6*  ALBUMIN 2.8* 2.8* 2.5*  AST '16 15 16  '$ ALT '13 12 13  '$ ALKPHOS 57 52 55  BILITOT 0.4 0.4 0.3   Lipids No results for input(s): "CHOL", "TRIG", "HDL", "LABVLDL", "LDLCALC", "CHOLHDL" in the last 168 hours.  Hematology Recent Labs  Lab 03/17/22 0252 03/18/22 0448 03/19/22 0442  WBC 7.6 9.4 9.5  RBC 3.65* 3.72* 3.59*  HGB 11.7* 11.9* 11.4*  HCT 35.5* 35.9* 36.0  MCV 97.3 96.5 100.3*  MCH 32.1 32.0 31.8  MCHC 33.0 33.1 31.7  RDW 12.3 12.2 12.5  PLT 123* 137* 133*   Thyroid No results for input(s): "TSH", "FREET4" in the last 168 hours.  BNPNo results for input(s): "BNP", "PROBNP" in the last 168 hours.  DDimer  Recent Labs  Lab 03/15/22 0330 03/17/22 0252 03/18/22 0448  DDIMER 1.08* 1.49* 1.79*     Radiology/Studies:  DG CHEST PORT 1 VIEW Result Date: 03/19/2022 CLINICAL DATA:  Provided history: Shortness of breath. EXAM: PORTABLE CHEST 1 VIEW COMPARISON:  Prior chest radiographs 03/18/2022 and earlier. FINDINGS: A loop recorder device projects over the left chest. Cardiomegaly, unchanged. Aortic atherosclerosis. Progressive opacity at the left lung base. A small left pleural effusion may be present. Persistent, although improved, ill-defined opacities within the right lung base. No definite pleural effusion. No evidence of pneumothorax. IMPRESSION: Opacities within the bilateral lung bases, improved on the right and progressed on the left, as compared to 03/18/2022. Findings may reflect atelectasis and/or pneumonia. A small left pleural effusion may be present. Cardiomegaly.  Aortic Atherosclerosis (ICD10-I70.0). Electronically Signed   By: Kellie Simmering D.O.   On: 03/19/2022 08:37   DG CHEST PORT 1 VIEW Result Date: 03/18/2022 CLINICAL DATA:  Shortness of breath EXAM: PORTABLE CHEST 1 VIEW COMPARISON:  Chest x-rays dated 02/15/2022 and 11/20/2020. Chest CT dated 03/08/2022. FINDINGS: Stable cardiomegaly. Stable to  slightly improved opacities at the bilateral lung bases. Coarse interstitial markings bilaterally. No pleural effusion or pneumothorax is seen. Osseous structures about the chest are unremarkable. IMPRESSION: 1. Stable to slightly improved opacities at the bilateral lung bases, atelectasis versus pneumonia. 2. Stable cardiomegaly. Electronically Signed   By: Franki Cabot M.D.   On: 03/18/2022 08:05   DG CHEST PORT 1 VIEW Result Date: 03/17/2022 CLINICAL DATA:  Sob; covid+ EXAM: PORTABLE CHEST - 1 VIEW COMPARISON:  03/16/2022 FINDINGS: Right middle lobe airspace disease is developing a somewhat more band like configuration. Left retrocardiac consolidation slightly increased. Stable cardiomegaly.  Aortic Atherosclerosis (ICD10-170.0). Blunting of left lateral costophrenic angle suggesting small effusion. Visualized bones unremarkable. Implanted monitor overlies the lower left chest. IMPRESSION: Developing right middle lobe and left retrocardiac infiltrates or atelectasis. Electronically Signed   By: Lucrezia Europe M.D.   On: 03/17/2022 09:44     Assessment and Plan:    Paroxysmal Afib CHA2DS2Vasc is 8, she is on Eliquis 2.'5mg'$  BID With her current weight and renal function recommend '5mg'$  BID I d/w attending, he Terrin Meddaugh bring pharmacy on board.  Bradycardia Telemetry reviewed at length No significant bradycardia, no pauses,  No rates <40 here, mostly 40's-50's, some 70's  Dr. Curt Bears has seen the patient Discussed with her no emergent need for PPM implant, recommend holding off for now and following up out patient  I called her son [name]) listed in the chart, got a voice mail but unable to leave a message.   She is discharging to SNF (for now at least) EP follow up is in place to re-visit PPM implant timing.    Risk Assessment/Risk Scores:    For questions or updates, please contact [place] Please consult [email or web address] for contact info under    Signed, Baldwin Jamaica,  PA-C  03/20/2022 3:43 PM   I have seen and examined this patient with Tommye Standard.  Agree with above, note added to reflect my findings.  Patient admitted to the hospital for altered mental status and encephalopathy.  She was noted to be COVID-positive.  Throughout her hospitalization she has been complaining of dizziness.  She has dizziness with standing and ambulating.  She was found to be orthostatic requiring IV fluids.  She has a history of bradycardia.  There was potentially a plan for pacemaker implant but this was canceled due to illness.  She has been having heart rates at times in the high 30s to 40s, but most often in the 50s to 60s.  She currently feels well.  GEN: Well nourished, well developed, in no acute distress  HEENT: normal  Neck: no JVD, carotid bruits, or masses Cardiac: Irregular; no murmurs, rubs, or gallops,no edema  Respiratory:  clear to auscultation bilaterally, normal work of breathing GI: soft, nontender, nondistended, + BS MS: no deformity or atrophy  Skin: warm and dry Neuro:  Strength and sensation are intact Psych: euthymic mood, full affect   Paroxysmal atrial fibrillation: CHA2DS2-VASc of 8.  Currently on Eliquis 2.5 mg twice daily.  Pharmacy has been consulted for dosing.  Hurbert Duran plan for rhythm control as an outpatient. Bradycardia: Heart rates in the 40s to  50s, occasionally below 40.  She is minimally symptomatic currently.  She does have dizziness, but she was orthostatic.  At this point, no indication for pacemaker implant.  Tessah Patchen arrange for follow-up in clinic to discuss pacemaker as an outpatient.  Phoenyx Melka M. Makhiya Coburn MD 03/20/2022 4:59 PM

## 2022-03-20 NOTE — NC FL2 (Addendum)
[place] LEVEL OF CARE FORM     IDENTIFICATION  Patient Name: Penny Miller Birthdate: 03-Jun-1931 Sex: female Admission Date (Current Location): 03/12/2022  [facility] and [place] Number:  Herbalist and Address:  [facility], [place] [address], [place],  [address]      Provider Number: [id number]  Attending Physician Name and Address:  Jonetta Osgood, MD  Relative Name and Phone Number:       Current Level of Care: Hospital Recommended Level of Care: [place] Prior Approval Number:    Date Approved/Denied:   PASRR Number:  [id number]  Discharge Plan: SNF    Current Diagnoses: Patient Active Problem List   Diagnosis Date Noted   Acute encephalopathy 03/12/2022   COVID-19 virus infection 03/12/2022   Asthma, chronic, unspecified asthma severity, with acute exacerbation 03/12/2022   Anemia due to acquired thiamine deficiency 01/30/2022   Anemia due to zinc deficiency 01/30/2022   Thrombocytopenia ([place]) 01/23/2022   Deficiency anemia 01/23/2022   Sinus node dysfunction ([place]) 01/17/2022   PVC (premature ventricular contraction) 01/17/2022   Moderate persistent asthma without complication 67/61/9509   Constipation 07/25/2021   Pulmonary nodule 07/25/2021   Compression fracture of T12 vertebra ([place]) 07/23/2021   OAB (overactive bladder) 12/26/2020   Chronic cerebral ischemia 11/20/2020   Frequent falls 11/10/2020   Chronic renal disease, stage 4, severely decreased glomerular filtration rate (GFR) between 15-29 mL/min/1.73 square meter ([place]) 07/22/2020   Seasonal allergic rhinitis due to pollen 02/03/2020   Intrinsic eczema 02/03/2020   Incomplete emptying of bladder 10/01/2019   Acquired hypothyroidism 08/15/2019   Iron deficiency anemia due to chronic blood loss 08/15/2019   Spinal stenosis, lumbar region with neurogenic claudication 05/26/2018   Vertigo of central origin 07/21/2017    Overweight (BMI 25.0-29.9) 11/07/2016   Paroxysmal atrial fibrillation ([place]) 09/03/2016   Diabetic polyneuropathy associated with type 2 diabetes mellitus ([place]) 09/03/2016   Aneurysm of vertebral artery ([place]) 08/12/2016   History of CVA (cerebrovascular accident) 07/29/2016   DM (diabetes mellitus), type 2 with renal complications ([place]) 32/67/1245   Bladder infection, chronic 01/04/2013   Insomnia, unspecified    Essential hypertension    Chronic interstitial cystitis     Orientation RESPIRATION BLADDER Height & Weight     Self, Time, Situation, Place  O2 (4L Nasal cannula) Incontinent Weight: 154 lb 1.6 oz (69.9 kg) Height:  '5\' 6"'$  (167.6 cm)  BEHAVIORAL SYMPTOMS/MOOD NEUROLOGICAL BOWEL NUTRITION STATUS      Continent Diet (See dc summary)  AMBULATORY STATUS COMMUNICATION OF NEEDS Skin   Limited Assist Verbally Normal                       Personal Care Assistance Level of Assistance  Bathing, Feeding, Dressing Bathing Assistance: Limited assistance Feeding assistance: Independent Dressing Assistance: Limited assistance     Functional Limitations Info             SPECIAL CARE FACTORS FREQUENCY  PT (By licensed PT), OT (By licensed OT)     PT Frequency: 5x/week OT Frequency: 5x/week            Contractures Contractures Info: Not present    Additional Factors Info  Code Status, Allergies, Isolation Precautions, Insulin Sliding Scale Code Status Info: DNR Allergies Info: Jardiance (Empagliflozin), Metformin And Related, Codeine, Metoprolol, Relafen (Nabumetone), Septra (Sulfamethoxazole-trimethoprim), Tetracycline, Adhesive (Tape), Ceclor (Cefaclor), Cefaclor, Codeine, Jardiance (Empagliflozin), Lopid (Gemfibrozil), Lopid (Gemfibrozil), Lopressor (Metoprolol Tartrate),  Metformin And Related, Morphine And Related, Morphine And Related, Niacin, Niacin And Related, Niacin And Related, Relafen (Nabumetone), Septra (Sulfamethoxazole-trimethoprim), Tape, Tetracyclines  & Related, Toprol Xl (Metoprolol Tartrate), Toprol Xl (Metoprolol)   Insulin Sliding Scale Info: See dc summary Isolation Precautions Info: COVID + 03/12/22     Current Medications (03/20/2022):  This is the current hospital active medication list Current Facility-Administered Medications  Medication Dose Route Frequency Provider Last Rate Last Admin   0.9 %  sodium chloride infusion   Intravenous Continuous Crosley, Debby, MD 75 mL/hr at 03/20/22 0718 Infusion Verify at 03/20/22 0718   acetaminophen (TYLENOL) tablet 650 mg  650 mg Oral Q6H PRN Marcelyn Bruins, MD       Or   acetaminophen (TYLENOL) suppository 650 mg  650 mg Rectal Q6H PRN Marcelyn Bruins, MD       albuterol (PROVENTIL) (2.5 MG/3ML) 0.083% nebulizer solution 2.5 mg  2.5 mg Nebulization Q2H PRN Marcelyn Bruins, MD       amLODipine (NORVASC) tablet 2.5 mg  2.5 mg Oral QHS Marcelyn Bruins, MD   2.5 mg at 03/17/22 2159   apixaban (ELIQUIS) tablet 2.5 mg  2.5 mg Oral BID Marcelyn Bruins, MD   2.5 mg at 03/19/22 2139   atorvastatin (LIPITOR) tablet 40 mg  40 mg Oral QHS Marcelyn Bruins, MD   40 mg at 03/19/22 2139   chlorpheniramine-HYDROcodone ([place]) 10-8 MG/5ML suspension 5 mL  5 mL Oral Q12H PRN Raiford Noble [place], DO   5 mL at 03/18/22 2202   gabapentin (NEURONTIN) capsule 300 mg  300 mg Oral TID Marcelyn Bruins, MD   300 mg at 03/19/22 2139   guaiFENesin (MUCINEX) 12 hr tablet 1,200 mg  1,200 mg Oral BID Raiford Noble [place], DO   1,200 mg at 03/19/22 2139   insulin aspart (novoLOG) injection 0-9 Units  0-9 Units Subcutaneous TID WC Marcelyn Bruins, MD   1 Units at 03/19/22 1704   insulin aspart (novoLOG) injection 4 Units  4 Units Subcutaneous TID WC Raiford Noble [place], DO   4 Units at 03/19/22 1702   methylPREDNISolone sodium succinate (SOLU-MEDROL) 40 mg/mL injection 40 mg  40 mg Intravenous Daily Raiford Noble [place], DO   40 mg at 03/19/22 1000   mometasone-formoterol (DULERA) 200-5  MCG/ACT inhaler 2 puff  2 puff Inhalation BID Marcelyn Bruins, MD   2 puff at 03/20/22 0912   polyethylene glycol (MIRALAX / GLYCOLAX) packet 17 g  17 g Oral Daily PRN Marcelyn Bruins, MD       sodium chloride flush (NS) 0.9 % injection 3 mL  3 mL Intravenous Q12H Marcelyn Bruins, MD   3 mL at 03/19/22 1000   thiamine (VITAMIN B1) tablet 50 mg  50 mg Oral Daily Marcelyn Bruins, MD   50 mg at 03/19/22 1000   zinc sulfate capsule 220 mg  220 mg Oral Daily Marcelyn Bruins, MD   220 mg at 03/19/22 1000     Discharge Medications: Please see discharge summary for a list of discharge medications.  Relevant Imaging Results:  Relevant Lab Results:   Additional Information SSN: [id number]  [place], [place]

## 2022-03-20 NOTE — Progress Notes (Signed)
PROGRESS NOTE        PATIENT DETAILS Name: Penny Miller Age: 88 y.o. Sex: female Date of Birth: 12/02/1931 Admit Date: 03/12/2022 Admitting Physician Kerney Elbe, DO XBM:[id number], Arvid Right, MD  Brief Summary: Patient is a 88 y.o.  female with history of persistent atrial fibrillation-not on rate control agents due to bradycardia-PPM being planned in the outpatient setting-presented to the hospital with acute hypoxic respiratory failure in the setting of COVID-19 infection and asthma exacerbation.  Consults: None  Subjective: Lying comfortably in bed-denies any chest pain or shortness of breath.  Objective: Vitals: Blood pressure (!) 152/61, pulse (!) 53, temperature 97.7 F (36.5 C), temperature source Oral, resp. rate 15, height '5\' 6"'$  (1.676 m), weight 69.9 kg, SpO2 100 %.   Exam: Gen Exam:Alert awake-not in any distress HEENT:atraumatic, normocephalic Chest: B/L clear to auscultation anteriorly CVS:S1S2 regular Abdomen:soft non tender, non distended Extremities:no edema Neurology: Non focal Skin: no rash  Pertinent Labs/Radiology:    Latest Ref Rng & Units 03/19/2022    4:42 AM 03/18/2022    4:48 AM 03/17/2022    2:52 AM  CBC  WBC 4.0 - 10.5 K/uL 9.5  9.4  7.6   Hemoglobin 12.0 - 15.0 g/dL 11.4  11.9  11.7   Hematocrit 36.0 - 46.0 % 36.0  35.9  35.5   Platelets 150 - 400 K/uL 133  137  123     Lab Results  Component Value Date   NA 133 (L) 03/19/2022   K 4.5 03/19/2022   CL 103 03/19/2022   CO2 25 03/19/2022      Assessment/Plan: Acute hypoxic respiratory failure due to COVID-19 pneumonia Asthma exacerbation Improved On room air at rest but requiring around 2 L with ambulation Completed a course of molnupiravir On tapering steroids/bronchodilators  Orthostatic hypotension Volume status stable.  Will stop all IVFs-has been adequately hydrated overnight. Likely neurogenic-in the setting of DM-2-worsened by  debility/deconditioning/COVID-19 infection Encourage oral intake Allow permissive hypertension Continue compression stockings  Persistent atrial fibrillation History of bradycardia Avoid rate control agents Unclear if the dizziness she is experiencing is related to these chronic issues-more likely related to ongoing issues with orthostatic hypotension/debility rather than rate issues-as telemetry does not show any significant pauses or bradycardia I will touch base with EP/cardiology service-as PPM implantation was scheduled but postponed due to her acute illness.  HTN Some resting hypertension-on amlodipine Cautiously continue but allow some amount of permissive hypertension.  History of cryptogenic stroke Likely embolic in the setting of A-fib On Eliquis  DM-2 (A1c 4.9 on 10/25) Follow with SSI  Recent Labs    03/19/22 2158 03/20/22 0904 03/20/22 1224  GLUCAP 83 96 172*    CKD stage IIIb At baseline  Asymptomatic bacteriuria No indication for antibiotics   Thrombocytopenia Mild Likely due to COVID-19 Repeat CBC in 1 week   BMI: Estimated body mass index is 24.87 kg/m as calculated from the following:   Height as of this encounter: '5\' 6"'$  (1.676 m).   Weight as of this encounter: 69.9 kg.   Code status:   Code Status: DNR   DVT Prophylaxis: Place TED hose Start: 03/17/22 1618 apixaban (ELIQUIS) tablet 2.5 mg Start: 03/12/22 2200 apixaban (ELIQUIS) tablet 2.5 mg     Family Communication: Son at bedside   Disposition Plan: Status is: Inpatient Remains inpatient appropriate because: Severity  of illness   Planned Discharge Destination:Skilled nursing facility   Diet: Diet Order             Diet heart healthy/carb modified Room service appropriate? Yes with Assist; Fluid consistency: Thin  Diet effective now           Diet - low sodium heart healthy           Diet Carb Modified                     Antimicrobial agents: Anti-infectives  (From admission, onward)    Start     Dose/Rate Route Frequency Ordered Stop   03/12/22 1900  molnupiravir EUA (LAGEVRIO) capsule 800 mg        4 capsule Oral 2 times daily 03/12/22 1800 03/17/22 0816        MEDICATIONS: Scheduled Meds:  amLODipine  2.5 mg Oral QHS   apixaban  2.5 mg Oral BID   atorvastatin  40 mg Oral QHS   citalopram  10 mg Oral Daily   gabapentin  300 mg Oral TID   guaiFENesin  1,200 mg Oral BID   insulin aspart  0-9 Units Subcutaneous TID WC   insulin aspart  4 Units Subcutaneous TID WC   methylPREDNISolone (SOLU-MEDROL) injection  40 mg Intravenous Daily   mometasone-formoterol  2 puff Inhalation BID   sodium chloride flush  3 mL Intravenous Q12H   thiamine  50 mg Oral Daily   zinc sulfate  220 mg Oral Daily   Continuous Infusions:  sodium chloride 75 mL/hr at 03/20/22 1200   PRN Meds:.acetaminophen **OR** acetaminophen, albuterol, chlorpheniramine-HYDROcodone, polyethylene glycol   I have personally reviewed following labs and imaging studies  LABORATORY DATA: CBC: Recent Labs  Lab 03/15/22 0330 03/16/22 0300 03/17/22 0252 03/18/22 0448 03/19/22 0442  WBC 6.3 5.7 7.6 9.4 9.5  NEUTROABS 4.8 4.7 6.0 7.8* 7.9*  HGB 11.0* 11.5* 11.7* 11.9* 11.4*  HCT 34.5* 34.4* 35.5* 35.9* 36.0  MCV 100.6* 98.0 97.3 96.5 100.3*  PLT 133* 124* 123* 137* 133*    Basic Metabolic Panel: Recent Labs  Lab 03/15/22 0330 03/16/22 0300 03/17/22 0252 03/18/22 0448 03/19/22 0442  NA 135 134* 135 133* 133*  K 4.5 4.6 4.7 4.7 4.5  CL 99 98 100 98 103  CO2 '28 27 30 28 25  '$ GLUCOSE 133* 218* 147* 189* 161*  BUN 35* 42* 39* 46* 43*  CREATININE 1.47* 1.33* 1.25* 1.46* 1.29*  CALCIUM 8.6* 8.8* 8.9 8.9 8.4*  MG 2.1 2.1 2.1 2.1 2.1  PHOS 3.6 3.3 3.3 3.0 3.4    GFR: Estimated Creatinine Clearance: 28.2 mL/min (A) (by C-G formula based on SCr of 1.29 mg/dL (H)).  Liver Function Tests: Recent Labs  Lab 03/15/22 0330 03/16/22 0300 03/17/22 0252 03/18/22 0448  03/19/22 0442  AST '21 20 16 15 16  '$ ALT '13 14 13 12 13  '$ ALKPHOS 61 61 57 52 55  BILITOT 0.4 0.5 0.4 0.4 0.3  PROT 5.2* 5.4* 5.2* 5.1* 4.6*  ALBUMIN 2.8* 3.1* 2.8* 2.8* 2.5*   No results for input(s): "LIPASE", "AMYLASE" in the last 168 hours. No results for input(s): "AMMONIA" in the last 168 hours.  Coagulation Profile: No results for input(s): "INR", "PROTIME" in the last 168 hours.  Cardiac Enzymes: No results for input(s): "CKTOTAL", "CKMB", "CKMBINDEX", "TROPONINI" in the last 168 hours.  BNP (last 3 results) Recent Labs    02/20/22 1150 03/04/22 0958  PROBNP 1,012.0* 863.0*  Lipid Profile: No results for input(s): "CHOL", "HDL", "LDLCALC", "TRIG", "CHOLHDL", "LDLDIRECT" in the last 72 hours.  Thyroid Function Tests: No results for input(s): "TSH", "T4TOTAL", "FREET4", "T3FREE", "THYROIDAB" in the last 72 hours.  Anemia Panel: Recent Labs    03/18/22 0448  FERRITIN 22    Urine analysis:    Component Value Date/Time   COLORURINE YELLOW 03/12/2022 1647   APPEARANCEUR HAZY (A) 03/12/2022 1647   APPEARANCEUR Cloudy (A) 12/17/2013 0906   LABSPEC 1.009 03/12/2022 1647   PHURINE 8.0 03/12/2022 1647   GLUCOSEU NEGATIVE 03/12/2022 1647   GLUCOSEU NEGATIVE 08/08/2021 1627   HGBUR NEGATIVE 03/12/2022 1647   BILIRUBINUR NEGATIVE 03/12/2022 1647   BILIRUBINUR Negative 12/17/2013 0906   KETONESUR NEGATIVE 03/12/2022 1647   PROTEINUR 30 (A) 03/12/2022 1647   UROBILINOGEN 0.2 08/08/2021 1627   NITRITE NEGATIVE 03/12/2022 1647   LEUKOCYTESUR NEGATIVE 03/12/2022 1647    Sepsis Labs: Lactic Acid, Venous    Component Value Date/Time   LATICACIDVEN 1.0 07/16/2021 1944    MICROBIOLOGY: Recent Results (from the past 240 hour(s))  Resp panel by RT-PCR (RSV, Flu A&B, Covid) Anterior Nasal Swab     Status: Abnormal   Collection Time: 03/12/22  2:47 PM   Specimen: Anterior Nasal Swab  Result Value Ref Range Status   SARS Coronavirus 2 by RT PCR POSITIVE (A) NEGATIVE  Final    Comment: (NOTE) SARS-CoV-2 target nucleic acids are DETECTED.  The SARS-CoV-2 RNA is generally detectable in upper respiratory specimens during the acute phase of infection. Positive results are indicative of the presence of the identified virus, but do not rule out bacterial infection or co-infection with other pathogens not detected by the test. Clinical correlation with patient history and other diagnostic information is necessary to determine patient infection status. The expected result is Negative.  Fact Sheet for Patients: EntrepreneurPulse.com.au  Fact Sheet for Healthcare Providers: IncredibleEmployment.be  This test is not yet approved or cleared by the [place] FDA and  has been authorized for detection and/or diagnosis of SARS-CoV-2 by FDA under an Emergency Use Authorization (EUA).  This EUA will remain in effect (meaning this test can be used) for the duration of  the COVID-19 declaration under Section 564(b)(1) of the A ct, 21 U.S.C. section 360bbb-3(b)(1), unless the authorization is terminated or revoked sooner.     Influenza A by PCR NEGATIVE NEGATIVE Final   Influenza B by PCR NEGATIVE NEGATIVE Final    Comment: (NOTE) The Xpert Xpress SARS-CoV-2/FLU/RSV plus assay is intended as an aid in the diagnosis of influenza from Nasopharyngeal swab specimens and should not be used as a sole basis for treatment. Nasal washings and aspirates are unacceptable for Xpert Xpress SARS-CoV-2/FLU/RSV testing.  Fact Sheet for Patients: EntrepreneurPulse.com.au  Fact Sheet for Healthcare Providers: IncredibleEmployment.be  This test is not yet approved or cleared by the [place] FDA and has been authorized for detection and/or diagnosis of SARS-CoV-2 by FDA under an Emergency Use Authorization (EUA). This EUA will remain in effect (meaning this test can be used) for the duration of  the COVID-19 declaration under Section 564(b)(1) of the Act, 21 U.S.C. section 360bbb-3(b)(1), unless the authorization is terminated or revoked.     Resp Syncytial Virus by PCR NEGATIVE NEGATIVE Final    Comment: (NOTE) Fact Sheet for Patients: EntrepreneurPulse.com.au  Fact Sheet for Healthcare Providers: IncredibleEmployment.be  This test is not yet approved or cleared by the [place] FDA and has been authorized for detection and/or diagnosis of SARS-CoV-2 by  FDA under an Emergency Use Authorization (EUA). This EUA will remain in effect (meaning this test can be used) for the duration of the COVID-19 declaration under Section 564(b)(1) of the Act, 21 U.S.C. section 360bbb-3(b)(1), unless the authorization is terminated or revoked.  Performed at [facility], [place] [address] [place], [place] [address]   Urine Culture     Status: Abnormal   Collection Time: 03/12/22  4:48 PM   Specimen: In/Out Cath Urine  Result Value Ref Range Status   Specimen Description IN/OUT CATH URINE  Final   Special Requests   Final    NONE Performed at [facility], [place] [address], [place], [place] [address]    Culture (A)  Final    >=100,000 COLONIES/mL DIPHTHEROIDS(CORYNEBACTERIUM SPECIES) Standardized susceptibility testing for this organism is not available. 20,000 COLONIES/mL ENTEROCOCCUS FAECALIS    Report Status 03/15/2022 FINAL  Final   Organism ID, Bacteria ENTEROCOCCUS FAECALIS (A)  Final      Susceptibility   Enterococcus faecalis - MIC*    AMPICILLIN <=2 SENSITIVE Sensitive     NITROFURANTOIN <=16 SENSITIVE Sensitive     VANCOMYCIN 2 SENSITIVE Sensitive     * 20,000 COLONIES/mL ENTEROCOCCUS FAECALIS    RADIOLOGY STUDIES/RESULTS: DG CHEST PORT 1 VIEW  Result Date: 03/19/2022 CLINICAL DATA:  Provided history: Shortness of breath. EXAM: PORTABLE CHEST 1 VIEW COMPARISON:  Prior chest radiographs 03/18/2022 and earlier.  FINDINGS: A loop recorder device projects over the left chest. Cardiomegaly, unchanged. Aortic atherosclerosis. Progressive opacity at the left lung base. A small left pleural effusion may be present. Persistent, although improved, ill-defined opacities within the right lung base. No definite pleural effusion. No evidence of pneumothorax. IMPRESSION: Opacities within the bilateral lung bases, improved on the right and progressed on the left, as compared to 03/18/2022. Findings may reflect atelectasis and/or pneumonia. A small left pleural effusion may be present. Cardiomegaly. Aortic Atherosclerosis (ICD10-I70.0). Electronically Signed   By: Kellie Simmering D.O.   On: 03/19/2022 08:37     LOS: 7 days   Oren Binet, MD  [facility]    To contact the attending provider between 7A-7P or the covering provider during after hours 7P-7A, please log into the web site [email or web address] and access using universal [place] password for that web site. If you do not have the password, please call the hospital operator.  03/20/2022, 2:11 PM

## 2022-03-20 NOTE — Progress Notes (Signed)
Physical Therapy Treatment Patient Details Name: Penny Miller MRN: [id number] DOB: 1931/11/25 Today's Date: 03/20/2022   History of Present Illness 88 y/o female admitted for acute encephalopathy, COVID-19 infection and mild asthma exacerbation on 03/12/22. Pt has had orthostatic hypotension during admission. PMH: HTN, CVA, a fib, CKD, DM, spinal stenosis.    PT Comments    Pt with significant decline in activity tolerance and mobility since seen yesterday. Needed more assistance and able to stand but only side stepped up side of bed and not able to amb as before. Updated dc recommendations to SNF. Pt reported feeling tired and weak but did not use the words dizzy or light headed even when asked. Orthostatics as follows;  Orthostatic BPs  Supine 159/60 (85)  Sitting 137/66 (85)  Standing 114/53 (68)  Standing after 3 min 129/58 (78)      Recommendations for follow up therapy are one component of a multi-disciplinary discharge planning process, led by the attending physician.  Recommendations may be updated based on patient status, additional functional criteria and insurance authorization.  Follow Up Recommendations  Skilled nursing-short term rehab (<3 hours/day) Can patient physically be transported by private vehicle: No   Assistance Recommended at Discharge Frequent or constant Supervision/Assistance  Patient can return home with the following A lot of help with walking and/or transfers;A lot of help with bathing/dressing/bathroom;Assist for transportation   Equipment Recommendations  None recommended by PT    Recommendations for Other Services       Precautions / Restrictions Precautions Precautions: Fall;Other (comment) Precaution Comments: orthostatic hypotension     Mobility  Bed Mobility Overal bed mobility: Needs Assistance Bed Mobility: Supine to Sit, Sit to Supine     Supine to sit: Min assist Sit to supine: Mod assist   General bed mobility comments:  Assist to elevate trunk into sitting. Assist to bring legs back up into bed.    Transfers Overall transfer level: Needs assistance Equipment used: Rolling walker (2 wheels) Transfers: Sit to/from Stand Sit to Stand: Mod assist           General transfer comment: Assist to bring hips up and for balance.    Ambulation/Gait             Pre-gait activities: Side stepped up toward [facility] 3' with min assist with walker.     Stairs             Wheelchair Mobility    Modified Rankin (Stroke Patients Only)       Balance Overall balance assessment: Needs assistance Sitting-balance support: Bilateral upper extremity supported, Feet supported Sitting balance-Leahy Scale: Poor Sitting balance - Comments: UE support                                    Cognition Arousal/Alertness: Awake/alert Behavior During Therapy: Flat affect Overall Cognitive Status: Impaired/Different from baseline Area of Impairment: Awareness, Attention                   Current Attention Level: Sustained       Awareness: Emergent            Exercises      General Comments General comments (skin integrity, edema, etc.): SpO2 >90% on 5L O2      Pertinent Vitals/Pain Pain Assessment Pain Assessment: No/denies pain    Home Living  Prior Function            PT Goals (current goals can now be found in the care plan section) Progress towards PT goals: Not progressing toward goals - comment    Frequency    Min 3X/week      PT Plan Discharge plan needs to be updated    Co-evaluation              AM-PAC PT "6 Clicks" Mobility   Outcome Measure  Help needed turning from your back to your side while in a flat bed without using bedrails?: A Little Help needed moving from lying on your back to sitting on the side of a flat bed without using bedrails?: A Little Help needed moving to and from a bed to a chair  (including a wheelchair)?: A Lot Help needed standing up from a chair using your arms (e.g., wheelchair or bedside chair)?: A Lot Help needed to walk in hospital room?: Total Help needed climbing 3-5 steps with a railing? : Total 6 Click Score: 12    End of Session Equipment Utilized During Treatment: Oxygen Activity Tolerance: Patient limited by fatigue Patient left: in bed;with call bell/phone within reach;with bed alarm set   PT Visit Diagnosis: Unsteadiness on feet (R26.81);Other abnormalities of gait and mobility (R26.89);Muscle weakness (generalized) (M62.81)     Time: 2876-8115 PT Time Calculation (min) (ACUTE ONLY): 20 min  Charges:  $Therapeutic Activity: 8-22 mins                     [place] 03/20/2022, 1:22 PM

## 2022-03-20 NOTE — Progress Notes (Addendum)
Occupational Therapy Treatment Patient Details Name: Penny Miller MRN: [id number] DOB: November 01, 1931 Today's Date: 03/20/2022   History of present illness 88 y/o female admitted for acute encephalopathy, COVID-19 infection and mild asthma exacerbation on 03/12/22. Pt has had orthostatic hypotension during admission. PMH: HTN, CVA, a fib, CKD, DM, spinal stenosis.   OT comments  OT team notified of need to see patient with family present. OT messaged RN to ensure pt present and when OT arrived to room no family present. TOC informed of session so that they could contact family with OT recommendation and pending PT session updates at the same time to avoid multiple calls to family. Pt required 5L 02 [place] during session to static sit eob . Pt declined progressing past EOB (attempted bSC). Pt with rolling toward L onto and off bed pan x2 noted to have dizziness and nausea. Pt falling asleep with chair like position in bed on bed pan. Pt with symptoms of dizziness static sitting and noted to have decreased 10 points in MAP. Recommendation updated to SNF due to lack of progress toward goals and inability to progress past EOB this session.     Recommendations for follow up therapy are one component of a multi-disciplinary discharge planning process, led by the attending physician.  Recommendations may be updated based on patient status, additional functional criteria and insurance authorization.    Follow Up Recommendations  Skilled nursing-short term rehab (<3 hours/day)     Assistance Recommended at Discharge Intermittent Supervision/Assistance  Patient can return home with the following  Two people to help with walking and/or transfers;Assist for transportation;Direct supervision/assist for medications management;Direct supervision/assist for financial management   Equipment Recommendations  Wheelchair (measurements OT);Wheelchair cushion (measurements OT);BSC/3in1    Recommendations for Other  Services      Precautions / Restrictions Precautions Precautions: Fall;Other (comment) Precaution Comments: orthostatic hypotension       Mobility Bed Mobility Overal bed mobility: Needs Assistance Bed Mobility: Supine to Sit, Sit to Supine     Supine to sit: Min assist Sit to supine: Min assist   General bed mobility comments: static sititng with BP 147/48 with symptoms of dizzines. pt requesting to return to supine with prolonged sitting. pt scooting toward HOB with mod (A) and pad    Transfers                   General transfer comment: declined     Balance Overall balance assessment: Needs assistance Sitting-balance support: Bilateral upper extremity supported, Feet supported Sitting balance-Leahy Scale: Poor Sitting balance - Comments: posterior bias and needing (A) to maintain static sitting min (A)                                   ADL either performed or assessed with clinical judgement   ADL Overall ADL's : Needs assistance/impaired Eating/Feeding: Independent Eating/Feeding Details (indicate cue type and reason): eating apple crisp and drinking coffee Grooming: Wash/dry face;Set up;Bed level Grooming Details (indicate cue type and reason): encouraged to produce secretions when coughing into napkin             Lower Body Dressing: Total assistance Lower Body Dressing Details (indicate cue type and reason): don non slip socks                    Extremity/Trunk Assessment Upper Extremity Assessment Upper Extremity Assessment: Generalized weakness   Lower Extremity Assessment  Lower Extremity Assessment: Generalized weakness        Vision       Perception     Praxis      Cognition Arousal/Alertness: Awake/alert Behavior During Therapy: Flat affect Overall Cognitive Status: Impaired/Different from baseline Area of Impairment: Awareness, Attention                   Current Attention Level: Sustained        Awareness: Emergent   General Comments: Pt reports needing to void but does not avoid and starts to fall asleep. pt after bed pan removed then reports needing to void and using purewick. pt declines OOB on feet static standing asking to return to supine. pt starts to talk to someone not present and specifically states 'no you " to therapist.        Exercises      Shoulder Instructions       General Comments 02 decreased to 80% on 4L static sitting . Required 5L to sustain > 90%. pt positioning in bed with x2 pillows behind patient and pt starts to fall asleep with upright posture.    Pertinent Vitals/ Pain       Pain Assessment Pain Assessment: No/denies pain  Home Living                                          Prior Functioning/Environment              Frequency  Min 2X/week        Progress Toward Goals  OT Goals(current goals can now be found in the care plan section)  Progress towards OT goals: Not progressing toward goals - comment  Acute Rehab OT Goals Patient Stated Goal: to go home OT Goal Formulation: With patient Time For Goal Achievement: 03/28/22 Potential to Achieve Goals: Good ADL Goals Pt Will Perform Lower Body Bathing: with modified independence;sit to/from stand Pt Will Perform Lower Body Dressing: with modified independence;sitting/lateral leans;sit to/from stand Pt Will Transfer to Toilet: with modified independence;ambulating  Plan Discharge plan remains appropriate    Co-evaluation                 AM-PAC OT "6 Clicks" Daily Activity     Outcome Measure   Help from another person eating meals?: A Little Help from another person taking care of personal grooming?: A Little Help from another person toileting, which includes using toliet, bedpan, or urinal?: A Lot Help from another person bathing (including washing, rinsing, drying)?: A Lot Help from another person to put on and taking off regular upper body  clothing?: A Little Help from another person to put on and taking off regular lower body clothing?: A Lot 6 Click Score: 15    End of Session Equipment Utilized During Treatment: Gait belt;Rolling walker (2 wheels);Oxygen  OT Visit Diagnosis: Unsteadiness on feet (R26.81);Muscle weakness (generalized) (M62.81)   Activity Tolerance Patient tolerated treatment well   Patient Left in bed;with call bell/phone within reach;with bed alarm set   Nurse Communication Mobility status        Time: 3329-5188 OT Time Calculation (min): 44 min  Charges: OT General Charges $OT Visit: 1 Visit OT Treatments $Self Care/Home Management : 38-52 mins   Brynn, OTR/L  Acute Rehabilitation Services Office: [phone] .   Jeri Modena 03/20/2022, 11:40 AM

## 2022-03-21 ENCOUNTER — Ambulatory Visit: Payer: Medicare Other | Admitting: Cardiology

## 2022-03-21 DIAGNOSIS — I4891 Unspecified atrial fibrillation: Secondary | ICD-10-CM | POA: Diagnosis not present

## 2022-03-21 DIAGNOSIS — I10 Essential (primary) hypertension: Secondary | ICD-10-CM | POA: Diagnosis not present

## 2022-03-21 DIAGNOSIS — R296 Repeated falls: Secondary | ICD-10-CM | POA: Diagnosis not present

## 2022-03-21 DIAGNOSIS — Z743 Need for continuous supervision: Secondary | ICD-10-CM | POA: Diagnosis not present

## 2022-03-21 DIAGNOSIS — I495 Sick sinus syndrome: Secondary | ICD-10-CM | POA: Diagnosis not present

## 2022-03-21 DIAGNOSIS — G629 Polyneuropathy, unspecified: Secondary | ICD-10-CM | POA: Diagnosis not present

## 2022-03-21 DIAGNOSIS — H9190 Unspecified hearing loss, unspecified ear: Secondary | ICD-10-CM | POA: Diagnosis not present

## 2022-03-21 DIAGNOSIS — E119 Type 2 diabetes mellitus without complications: Secondary | ICD-10-CM | POA: Diagnosis not present

## 2022-03-21 DIAGNOSIS — Z7401 Bed confinement status: Secondary | ICD-10-CM | POA: Diagnosis not present

## 2022-03-21 DIAGNOSIS — E785 Hyperlipidemia, unspecified: Secondary | ICD-10-CM | POA: Diagnosis not present

## 2022-03-21 DIAGNOSIS — G4700 Insomnia, unspecified: Secondary | ICD-10-CM | POA: Diagnosis not present

## 2022-03-21 DIAGNOSIS — J309 Allergic rhinitis, unspecified: Secondary | ICD-10-CM | POA: Diagnosis not present

## 2022-03-21 DIAGNOSIS — R404 Transient alteration of awareness: Secondary | ICD-10-CM | POA: Diagnosis not present

## 2022-03-21 DIAGNOSIS — J45901 Unspecified asthma with (acute) exacerbation: Secondary | ICD-10-CM | POA: Diagnosis not present

## 2022-03-21 DIAGNOSIS — G9340 Encephalopathy, unspecified: Secondary | ICD-10-CM | POA: Diagnosis not present

## 2022-03-21 DIAGNOSIS — E039 Hypothyroidism, unspecified: Secondary | ICD-10-CM | POA: Diagnosis not present

## 2022-03-21 DIAGNOSIS — K219 Gastro-esophageal reflux disease without esophagitis: Secondary | ICD-10-CM | POA: Diagnosis not present

## 2022-03-21 DIAGNOSIS — I499 Cardiac arrhythmia, unspecified: Secondary | ICD-10-CM | POA: Diagnosis not present

## 2022-03-21 DIAGNOSIS — K589 Irritable bowel syndrome without diarrhea: Secondary | ICD-10-CM | POA: Diagnosis not present

## 2022-03-21 DIAGNOSIS — M1990 Unspecified osteoarthritis, unspecified site: Secondary | ICD-10-CM | POA: Diagnosis not present

## 2022-03-21 DIAGNOSIS — E1142 Type 2 diabetes mellitus with diabetic polyneuropathy: Secondary | ICD-10-CM | POA: Diagnosis not present

## 2022-03-21 DIAGNOSIS — M6281 Muscle weakness (generalized): Secondary | ICD-10-CM | POA: Diagnosis not present

## 2022-03-21 DIAGNOSIS — M4800 Spinal stenosis, site unspecified: Secondary | ICD-10-CM | POA: Diagnosis not present

## 2022-03-21 LAB — GLUCOSE, CAPILLARY
Glucose-Capillary: 117 mg/dL — ABNORMAL HIGH (ref 70–99)
Glucose-Capillary: 158 mg/dL — ABNORMAL HIGH (ref 70–99)

## 2022-03-21 MED ORDER — PREDNISONE 10 MG PO TABS: ORAL_TABLET | ORAL | 0 refills | Status: DC

## 2022-03-21 NOTE — Progress Notes (Addendum)
1157 Attempted to call report to [facility]. Unsuccessful at this time.   1205 Attempted to call [facility] x3. Unsuccessful.   1325 Transport arrived. Shaneice Barsanti (son) made aware.

## 2022-03-21 NOTE — Discharge Summary (Signed)
PATIENT DETAILS Name: Penny Miller Age: 88 y.o. Sex: female Date of Birth: [birth year] MRN: [id number]. Admitting Physician: Kerney Elbe, DO FIE:PPIRJ, Arvid Right, MD  Admit Date: 03/12/2022 Discharge date: 03/21/2022  Recommendations for Outpatient Follow-up:  Follow up with PCP in 1-2 weeks Please obtain CMP/CBC in one week Please ensure with follow up EP/cardiology. Repeat two-view chest x-ray in 4 to 6 weeks Assess for home O2 requirement at next follow-up-needing minimal amount of O2 with ambulation.  Admitted From:  Home  Disposition: Skilled nursing facility   Discharge Condition: good  CODE STATUS:   Code Status: DNR   Diet recommendation:  Diet Order             Diet - low sodium heart healthy           Diet Carb Modified           Diet heart healthy/carb modified Room service appropriate? Yes with Assist; Fluid consistency: Thin  Diet effective now                    Brief Summary: Patient is a 88 y.o.  female with history of persistent atrial fibrillation-not on rate control agents due to bradycardia-PPM being planned in the outpatient setting-presented to the hospital with acute hypoxic respiratory failure in the setting of COVID-19 infection and asthma exacerbation.   Brief Hospital Course: Acute hypoxic respiratory failure due to COVID-19 pneumonia Asthma exacerbation Much improved On room air at rest but requiring around 2 L with ambulation Completed a course of molnupiravir Continue tapering steroids on discharge-continue bronchodilators.   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  Encourage oral intake Allow permissive hypertension Continue compression stockings   Persistent atrial fibrillation History of bradycardia Avoid rate control agents Unclear if the dizziness she is  experiencing is related to these chronic issues-more likely related to ongoing issues with orthostatic hypotension/debility rather than rate issues-as telemetry does not show any significant pauses or bradycardia Evaluated by EP on 12/20-no need for inpatient PPM implantation-okay to follow-up with EP in the outpatient setting once she recovers from acute illness.   HTN Mildly elevated blood pressure-continue amlodipine.  Allow some permissive hypertension in the setting of orthostatic hypotension.    History of cryptogenic stroke Likely embolic in the setting of A-fib On Eliquis   DM-2 (A1c 4.9 on 10/25) Stable on SSI Watch sugars closely  CKD stage IIIb At baseline   Asymptomatic bacteriuria No indication for antibiotics    Thrombocytopenia Mild Likely due to COVID-19 Repeat CBC in 1 week    BMI: Estimated body mass index is 24.87 kg/m as calculated from the following:   Height as of this encounter: '5\' 6"'$  (1.676 m).   Weight as of this encounter: 69.9 kg.    Discharge Diagnoses:  Principal Problem:   Acute encephalopathy Active Problems:   Essential hypertension   DM (diabetes mellitus), type 2 with renal complications (HCC)   History of CVA (cerebrovascular accident)   Paroxysmal atrial fibrillation (HCC)   Diabetic polyneuropathy associated with type 2 diabetes mellitus ([place])   Acquired hypothyroidism   Chronic renal disease, stage 4, severely decreased glomerular filtration rate (GFR) between 15-29 mL/min/1.73 square meter ([place])   COVID-19 virus infection   Asthma, chronic, unspecified asthma severity, with acute exacerbation   Discharge Instructions:  Activity:  As tolerated with Full fall precautions use walker/cane & assistance as  needed   Discharge Instructions     Call MD for:  difficulty breathing, headache or visual disturbances   Complete by: As directed    Call MD for:  difficulty breathing, headache or visual disturbances   Complete by: As  directed    Call MD for:  extreme fatigue   Complete by: As directed    Call MD for:  hives   Complete by: As directed    Call MD for:  persistant dizziness or light-headedness   Complete by: As directed    Call MD for:  persistant nausea and vomiting   Complete by: As directed    Call MD for:  redness, tenderness, or signs of infection (pain, swelling, redness, odor or green/yellow discharge around incision site)   Complete by: As directed    Call MD for:  severe uncontrolled pain   Complete by: As directed    Call MD for:  temperature >100.4   Complete by: As directed    Diet - low sodium heart healthy   Complete by: As directed    Diet Carb Modified   Complete by: As directed    Discharge instructions   Complete by: As directed    You were cared for by a hospitalist during your hospital stay. If you have any questions about your discharge medications or the care you received while you were in the hospital after you are discharged, you can call the unit and ask to speak with the hospitalist on call if the hospitalist that took care of you is not available. Once you are discharged, your primary care physician will handle any further medical issues. Please note that NO REFILLS for any discharge medications will be authorized once you are discharged, as it is imperative that you return to your primary care physician (or establish a relationship with a primary care physician if you do not have one) for your aftercare needs so that they can reassess your need for medications and monitor your lab values.  Follow up with PCP and repeat CXR in 3-6 weeks and follow up with PCCM if necessary. Take all medications as prescribed. If symptoms change or worsen please return to the ED for evaluation   Discharge instructions   Complete by: As directed    Follow with Primary MD  [name], MD in 1-2 weeks  Please get a complete blood count and chemistry panel checked by your Primary MD at your  next visit, and again as instructed by your Primary MD.  Get Medicines reviewed and adjusted: Please take all your medications with you for your next visit with your Primary MD  Laboratory/radiological data: Please request your Primary MD to go over all hospital tests and procedure/radiological results at the follow up, please ask your Primary MD to get all Hospital records sent to his/her office.  In some cases, they will be blood work, cultures and biopsy results pending at the time of your discharge. Please request that your primary care M.D. follows up on these results.  Also Note the following: If you experience worsening of your admission symptoms, develop shortness of breath, life threatening emergency, suicidal or homicidal thoughts you must seek medical attention immediately by calling 911 or calling your MD immediately  if symptoms less severe.  You must read complete instructions/literature along with all the possible adverse reactions/side effects for all the Medicines you take and that have been prescribed to you. Take any new Medicines after you have completely understood and  accpet all the possible adverse reactions/side effects.   Do not drive when taking Pain medications or sleeping medications (Benzodaizepines)  Do not take more than prescribed Pain, Sleep and Anxiety Medications. It is not advisable to combine anxiety,sleep and pain medications without talking with your primary care practitioner  Special Instructions: If you have smoked or chewed Tobacco  in the last 2 yrs please stop smoking, stop any regular Alcohol  and or any Recreational drug use.  Wear Seat belts while driving.  Please note: You were cared for by a hospitalist during your hospital stay. Once you are discharged, your primary care physician will handle any further medical issues. Please note that NO REFILLS for any discharge medications will be authorized once you are discharged, as it is imperative  that you return to your primary care physician (or establish a relationship with a primary care physician if you do not have one) for your post hospital discharge needs so that they can reassess your need for medications and monitor your lab values.   Increase activity slowly   Complete by: As directed    Increase activity slowly   Complete by: As directed       Allergies as of 03/21/2022       Reactions   Jardiance [empagliflozin] Diarrhea   Metformin And Related Diarrhea   Codeine Nausea And Vomiting   Metoprolol Nausea And Vomiting   Relafen [nabumetone] Nausea And Vomiting   Septra [sulfamethoxazole-trimethoprim] Nausea And Vomiting   Tetracycline Nausea And Vomiting   Adhesive [tape] Rash, Other (See Comments)   Tears the skin also   Ceclor [cefaclor] Other (See Comments)   Unknown reaction; not recalled   Cefaclor Other (See Comments)   Unknown reaction   Codeine Nausea And Vomiting   Jardiance [empagliflozin] Diarrhea   Lopid [gemfibrozil] Other (See Comments)   Unknown reaction; not recalled   Lopid [gemfibrozil] Other (See Comments)   Unknown reaction   Lopressor [metoprolol Tartrate] Nausea And Vomiting   Metformin And Related Diarrhea   Morphine And Related Other (See Comments)   Passed out   Morphine And Related Other (See Comments)   Passed out   Niacin Nausea And Vomiting   Unknown reaction Unknown reaction; not recalled   Niacin And Related Other (See Comments)   Unknown reaction; not recalled   Niacin And Related Other (See Comments)   Unknown reaction   Relafen [nabumetone] Nausea And Vomiting   Septra [sulfamethoxazole-trimethoprim] Nausea And Vomiting   Tape Rash, Other (See Comments)   Tears skin   Tetracyclines & Related Nausea And Vomiting   Toprol Xl [metoprolol Tartrate] Nausea And Vomiting   Toprol Xl [metoprolol] Nausea And Vomiting        Medication List     STOP taking these medications    furosemide 20 MG tablet Commonly known  as: LASIX   potassium chloride SA 20 MEQ tablet Commonly known as: KLOR-CON M       TAKE these medications    Accu-Chek Aviva Plus test strip Generic drug: glucose blood USE TO TEST BLOOD SUGAR TWICE DAILY.   accu-chek multiclix lancets Use to test blood sugar twice daily. E11.29   acetaminophen 500 MG tablet Commonly known as: TYLENOL Take 500 mg by mouth every 6 (six) hours as needed (pain).   amLODipine 2.5 MG tablet Commonly known as: NORVASC Take 2.5 mg by mouth at bedtime.   apixaban 2.5 MG Tabs tablet Commonly known as: Eliquis Take 1 tablet (2.5 mg total) by  mouth 2 (two) times daily.   atorvastatin 40 MG tablet Commonly known as: LIPITOR TAKE 1 TABLET AT BEDTIME What changed: when to take this   budesonide 0.5 MG/2ML nebulizer solution Commonly known as: PULMICORT USE 1 VIAL  IN  NEBULIZER TWICE  DAILY - - Rinse Mouth After Treatment What changed: See the new instructions.   citalopram 10 MG tablet Commonly known as: CELEXA TAKE 1 TABLET DAILY   famotidine 20 MG tablet Commonly known as: PEPCID Take one tablet once to twice a day What changed:  how much to take how to take this when to take this additional instructions   ferrous sulfate 325 (65 FE) MG tablet Take 1 tablet (325 mg total) by mouth 2 (two) times daily with a meal.   fluticasone-salmeterol 250-50 MCG/ACT Aepb Commonly known as: ADVAIR Inhale 1 puff into the lungs in the morning and at bedtime.   gabapentin 300 MG capsule Commonly known as: NEURONTIN Take 1 capsule (300 mg total) by mouth 3 (three) times daily.   hyoscyamine 0.125 MG SL tablet Commonly known as: LEVSIN SL Place 1 tablet (0.125 mg total) under the tongue every 4 (four) hours as needed. Take tablet every 4-6 hours for abdominal pain and cramping What changed:  reasons to take this additional instructions   ipratropium-albuterol 0.5-2.5 (3) MG/3ML Soln Commonly known as: DUONEB USE 1 VIAL IN NEBULIZER EVERY 6  HOURS - And As Needed What changed: See the new instructions.   levocetirizine 5 MG tablet Commonly known as: XYZAL Take 1 tablet (5 mg total) by mouth every evening.   lidocaine 5 % Commonly known as: Lidoderm Place 1 patch onto the skin daily. Remove & Discard patch within 12 hours or as directed by MD What changed:  when to take this reasons to take this additional instructions   Mucus Relief 600 MG 12 hr tablet Generic drug: guaiFENesin Take 2 tablets (1,200 mg total) by mouth 2 (two) times daily for 5 days.   Nebulizer Misc 1 Act by Does not apply route daily. Dx J45.4   NONFORMULARY OR COMPOUNDED ITEM Transport chair   #1    dx leg and hip pain s/p fall   NONFORMULARY OR COMPOUNDED ITEM Rollator walker with seat    #1   dx gait abnormality   polyethylene glycol powder 17 GM/SCOOP powder Commonly known as: GLYCOLAX/MIRALAX Take 17 g by mouth daily as needed for mild constipation.   predniSONE 10 MG tablet Commonly known as: DELTASONE Take 40 mg daily for 1 day, 30 mg daily for 1 day, 20 mg daily for 1 days,10 mg daily for 1 day, then stop   thiamine 50 MG tablet Commonly known as: VITAMIN B-1 Take 1 tablet (50 mg total) by mouth daily.   triamcinolone cream 0.5 % Commonly known as: KENALOG APPLY 1 APPLICATION TOPICALLY TWICE A DAY What changed: See the new instructions.   VITAMIN B 12 PO Take 1 tablet by mouth daily.   Vitamin D (Ergocalciferol) 1.25 MG (50000 UNIT) Caps capsule Commonly known as: DRISDOL TAKE 1 CAPSULE EVERY 7 DAYS What changed: See the new instructions.   zinc gluconate 50 MG tablet Take 1 tablet (50 mg total) by mouth daily.               Durable Medical Equipment  (From admission, onward)           Start     Ordered   03/19/22 1556  DME Oxygen  Once  Question Answer Comment  Length of Need 6 Months   Mode or (Route) Nasal cannula   Liters per Minute 2   Frequency Continuous (stationary and portable oxygen unit  needed)   Oxygen delivery system Gas      03/19/22 1559            Contact information for follow-up providers     [facility].   Contact information: [address] [place] [address] [phone]         [facility], [facility] .   Specialty: Home Health Services Contact information: [place] [address] [phone]         [name], MD. Schedule an appointment as soon as possible for a visit in 1 week(s).   Specialty: Internal Medicine Contact information: [place] [address] [phone]         [name], MD. Schedule an appointment as soon as possible for a visit in 1 week(s).   Specialty: Cardiology Contact information: [address] [place] [address] [phone]              Contact information for after-discharge care     [facility] .   Service: Skilled Nursing Contact information: [address] [address] [phone]                    Allergies  Allergen Reactions   Jardiance [Empagliflozin] Diarrhea   Metformin And Related Diarrhea   Codeine Nausea And Vomiting   Metoprolol Nausea And Vomiting   Relafen [Nabumetone] Nausea And Vomiting   Septra [Sulfamethoxazole-Trimethoprim] Nausea And Vomiting   Tetracycline Nausea And Vomiting   Adhesive [Tape] Rash and Other (See Comments)    Tears the skin also   Ceclor [Cefaclor] Other (See Comments)    Unknown reaction; not recalled   Cefaclor Other (See Comments)    Unknown reaction   Codeine Nausea And Vomiting   Jardiance [Empagliflozin] Diarrhea   Lopid [Gemfibrozil] Other (See Comments)    Unknown reaction; not recalled   Lopid [Gemfibrozil] Other (See Comments)    Unknown reaction   Lopressor [Metoprolol Tartrate] Nausea And Vomiting   Metformin And Related Diarrhea    Morphine And Related Other (See Comments)    Passed out   Morphine And Related Other (See Comments)    Passed out   Niacin Nausea And Vomiting    Unknown reaction Unknown reaction; not recalled   Niacin And Related Other (See Comments)    Unknown reaction; not recalled   Niacin And Related Other (See Comments)    Unknown reaction   Relafen [Nabumetone] Nausea And Vomiting   Septra [Sulfamethoxazole-Trimethoprim] Nausea And Vomiting   Tape Rash and Other (See Comments)    Tears skin   Tetracyclines & Related Nausea And Vomiting   Toprol Xl [Metoprolol Tartrate] Nausea And Vomiting   Toprol Xl [Metoprolol] Nausea And Vomiting     Other Procedures/Studies: DG CHEST PORT 1 VIEW  Result Date: 03/19/2022 CLINICAL DATA:  Provided history: Shortness of breath. EXAM: PORTABLE CHEST 1 VIEW COMPARISON:  Prior chest radiographs 03/18/2022 and earlier. FINDINGS: A loop recorder device projects over the left chest. Cardiomegaly, unchanged. Aortic atherosclerosis. Progressive opacity at the left lung base. A small left pleural effusion may be present. Persistent, although improved, ill-defined opacities within the right lung base. No  definite pleural effusion. No evidence of pneumothorax. IMPRESSION: Opacities within the bilateral lung bases, improved on the right and progressed on the left, as compared to 03/18/2022. Findings may reflect atelectasis and/or pneumonia. A small left pleural effusion may be present. Cardiomegaly. Aortic Atherosclerosis (ICD10-I70.0). Electronically Signed   By: Kellie Simmering D.O.   On: 03/19/2022 08:37   DG CHEST PORT 1 VIEW  Result Date: 03/18/2022 CLINICAL DATA:  Shortness of breath EXAM: PORTABLE CHEST 1 VIEW COMPARISON:  Chest x-rays dated 02/15/2022 and 11/20/2020. Chest CT dated 03/08/2022. FINDINGS: Stable cardiomegaly. Stable to slightly improved opacities at the bilateral lung bases. Coarse interstitial markings bilaterally. No pleural effusion or pneumothorax is  seen. Osseous structures about the chest are unremarkable. IMPRESSION: 1. Stable to slightly improved opacities at the bilateral lung bases, atelectasis versus pneumonia. 2. Stable cardiomegaly. Electronically Signed   By: Franki Cabot M.D.   On: 03/18/2022 08:05   DG CHEST PORT 1 VIEW  Result Date: 03/17/2022 CLINICAL DATA:  Sob; covid+ EXAM: PORTABLE CHEST - 1 VIEW COMPARISON:  03/16/2022 FINDINGS: Right middle lobe airspace disease is developing a somewhat more band like configuration. Left retrocardiac consolidation slightly increased. Stable cardiomegaly.  Aortic Atherosclerosis (ICD10-170.0). Blunting of left lateral costophrenic angle suggesting small effusion. Visualized bones unremarkable. Implanted monitor overlies the lower left chest. IMPRESSION: Developing right middle lobe and left retrocardiac infiltrates or atelectasis. Electronically Signed   By: Lucrezia Europe M.D.   On: 03/17/2022 09:44   DG CHEST PORT 1 VIEW  Result Date: 03/16/2022 CLINICAL DATA:  Shortness of breath.  COVID-19 positive EXAM: PORTABLE CHEST 1 VIEW COMPARISON:  03/15/2022 FINDINGS: Lungs are adequately inflated with slight worsening hazy opacification over the right middle lobe region and medial right apex. No effusion. Cardiomediastinal silhouette and remainder of the exam is unchanged. IMPRESSION: Slight worsening hazy opacification over the right middle lobe region and medial right apex likely multifocal infection. Electronically Signed   By: Marin Olp M.D.   On: 03/16/2022 10:20   DG CHEST PORT 1 VIEW  Result Date: 03/15/2022 CLINICAL DATA:  COVID. EXAM: PORTABLE CHEST 1 VIEW COMPARISON:  Chest x-ray from yesterday. FINDINGS: Unchanged mild cardiomegaly. Loop recorder again noted. Unchanged small left pleural effusion with left basilar atelectasis. The lungs are otherwise clear. No pneumothorax. No acute osseous abnormality. IMPRESSION: 1. Unchanged small left pleural effusion with left basilar atelectasis.  Electronically Signed   By: Titus Dubin M.D.   On: 03/15/2022 08:11   DG CHEST PORT 1 VIEW  Result Date: 03/14/2022 CLINICAL DATA:  COVID EXAM: PORTABLE CHEST 1 VIEW COMPARISON:  Two days ago FINDINGS: Stable heart size and mediastinal contours. Implantable cardiac device. Artifact from support hardware. Hazy density at the left base from pleural fluid and atelectasis by recent CT. No pneumothorax. IMPRESSION: Stable compared to 2 days prior. Mild left opacity from pleural fluid and atelectasis by recent chest CT. Electronically Signed   By: Jorje Guild M.D.   On: 03/14/2022 07:15   CT Head Wo Contrast  Result Date: 03/12/2022 CLINICAL DATA:  Mental status change, unknown cause. EXAM: CT HEAD WITHOUT CONTRAST TECHNIQUE: Contiguous axial images were obtained from the base of the skull through the vertex without intravenous contrast. RADIATION DOSE REDUCTION: This exam was performed according to the departmental dose-optimization program which includes automated exposure control, adjustment of the mA and/or kV according to patient size and/or use of iterative reconstruction technique. COMPARISON:  CT head dated July 16, 2021 FINDINGS: Brain: No evidence of acute infarction,  hemorrhage, hydrocephalus, extra-axial collection or mass lesion/mass effect. Small infarct of the left occipital lobe is unchanged. Advanced chronic microvascular ischemic changes of the periventricular and subcortical white matter. Vascular: No hyperdense vessel or unexpected calcification. Skull: Normal. Negative for fracture or focal lesion. Sinuses/Orbits: No acute finding. Other: None. IMPRESSION: 1. No acute intracranial abnormality. 2. Advanced chronic microvascular ischemic changes of the periventricular and subcortical white matter. Electronically Signed   By: Keane Police D.O.   On: 03/12/2022 17:59   DG Chest 2 View  Result Date: 03/12/2022 CLINICAL DATA:  Cough with altered mental status for 1 day. EXAM: CHEST -  2 VIEW COMPARISON:  Radiographs 02/20/2022 and 07/16/2021.  CT 03/08/2022. FINDINGS: 1522 hours. The heart size and mediastinal contours are stable with aortic atherosclerosis. Unchanged left greater than right pleural effusions with associated left basilar airspace disease. There is possible mild pulmonary edema. No pneumothorax or acute osseous abnormality. Telemetry leads overlie the chest. IMPRESSION: Unchanged left greater than right pleural effusions and left basilar airspace disease. Possible mild pulmonary edema. Electronically Signed   By: Richardean Sale M.D.   On: 03/12/2022 15:44   CT Chest Wo Contrast  Result Date: 03/09/2022 CLINICAL DATA:  Dyspnea, chronic, unclear etiology CT 4/23 with opacity in lungs - f/u, also with DOE, wheeze, hypoxia Opacity noted on imaging study. Dyspnea on exertion. EXAM: CT CHEST WITHOUT CONTRAST TECHNIQUE: Multidetector CT imaging of the chest was performed following the standard protocol without IV contrast. RADIATION DOSE REDUCTION: This exam was performed according to the departmental dose-optimization program which includes automated exposure control, adjustment of the mA and/or kV according to patient size and/or use of iterative reconstruction technique. COMPARISON:  Radiograph 02/20/2022, CT 07/16/2021 FINDINGS: Cardiovascular: Moderate atherosclerosis of the thoracic aorta. Aortic tortuosity without aortic aneurysm. Mild cardiomegaly. There are coronary artery calcifications. Dilated central pulmonary artery at 3.1 cm. No pericardial effusion. Mediastinum/Nodes: Shotty mediastinal lymph nodes, none of which are enlarged by size criteria. No thyroid nodule. Decompressed esophagus. Lungs/Pleura: Small to moderate left and small right pleural effusion. There is associated compressive atelectasis primarily in the lower lobes. Moderate central bronchial thickening. Slight heterogeneous pulmonary parenchyma. Persistent left upper lobe ground-glass opacity measuring  approximately 6 mm, series 8, image 40. There is a 2 mm solid component centrally. Adjacent 3 mm left upper lobe nodule series 8, image 39. There are multiple 2-3 mm right upper lobe nodules including series 8 images 45, 46, and 55. These are unchanged from prior exam. Scattered additional tiny pulmonary millimetric nodules are also seen. Multiple scattered calcified granulomas are benign, these nodules need no specific imaging follow-up. New subpleural area of nodularity in the lingula measures approximately 7 x 8 mm, series 8, image 83. Adjacent linear opacities. Upper Abdomen: No acute upper abdominal findings. Musculoskeletal: T12 compression fracture has progressed from prior CT, prominent loss of height centrally with cortical buckling of the posterior cortex. Slight superior endplate compression deformities of T3 and T5. These are unchanged. Left chest wall loop recorder. There may be slight chest wall edema. IMPRESSION: 1. Small to moderate left and small right pleural effusions with associated compressive atelectasis. 2. Moderate central bronchial thickening. Heterogeneous pulmonary parenchyma, can be seen with small airways disease. 3. Persistent 6 mm left upper lobe ground-glass opacity with 2 mm solid component. Follow-up of this ground-glass nodule is recommended every 2 years until 5 years of stability is established. 4. New subpleural area of nodularity in the lingula measuring 7 x 8 mm with adjacent linear opacities.  This may be infectious or inflammatory, however recommend follow-up chest CT in 6-12 months is recommended to assess for stability for new nodule of this size. 5. Multiple additional tiny pulmonary nodules are stable from prior exam. 6. Cardiomegaly with coronary artery calcifications. Dilated central pulmonary artery suggesting pulmonary arterial hypertension. 7. Progressive T12 compression fracture since prior CT, now with significant loss of height. Additional chronic compression  fractures of T3 and T5 are unchanged. Aortic Atherosclerosis (ICD10-I70.0). Electronically Signed   By: Keith Rake M.D.   On: 03/09/2022 19:15   DG Chest 2 View  Result Date: 02/20/2022 CLINICAL DATA:  Wheezing, shortness of breath and decreased oxygen at night. EXAM: CHEST - 2 VIEW COMPARISON:  07/16/2021 FINDINGS: Stable mild cardiac enlargement. Aortic atherosclerotic calcifications. Loop recorder is identified in the projection of the left heart. Pulmonary vascular congestion is identified. There are small bilateral pleural effusions are identified, left greater than right. No airspace opacities. IMPRESSION: 1. Small bilateral pleural effusions, left greater than right. 2. Pulmonary vascular congestion. Electronically Signed   By: Kerby Moors M.D.   On: 02/20/2022 11:57     TODAY-DAY OF DISCHARGE:  Subjective:   Rubee Vega today has no headache,no chest abdominal pain,no new weakness tingling or numbness, feels much better wants to go home today.   Objective:   Blood pressure (!) 157/62, pulse (!) 50, temperature 98.6 F (37 C), temperature source Oral, resp. rate 12, height '5\' 6"'$  (1.676 m), weight 69.9 kg, SpO2 100 %.  Intake/Output Summary (Last 24 hours) at 03/21/2022 0852 Last data filed at 03/21/2022 0500 Gross per 24 hour  Intake 466.69 ml  Output 1150 ml  Net -683.31 ml   Filed Weights   03/12/22 2134  Weight: 69.9 kg    Exam: Awake Alert, Oriented *3, No new F.N deficits, Normal affect Blum.AT,PERRAL Supple Neck,No JVD, No cervical lymphadenopathy appriciated.  Symmetrical Chest wall movement, Good air movement bilaterally, CTAB RRR,No Gallops,Rubs or new Murmurs, No Parasternal Heave +ve B.Sounds, Abd Soft, Non tender, No organomegaly appriciated, No rebound -guarding or rigidity. No Cyanosis, Clubbing or edema, No new Rash or bruise   PERTINENT RADIOLOGIC STUDIES: No results found.   PERTINENT LAB RESULTS: CBC: Recent Labs    03/19/22 0442  WBC  9.5  HGB 11.4*  HCT 36.0  PLT 133*   CMET CMP     Component Value Date/Time   NA 133 (L) 03/19/2022 0442   NA 138 08/31/2020 1211   K 4.5 03/19/2022 0442   CL 103 03/19/2022 0442   CO2 25 03/19/2022 0442   GLUCOSE 161 (H) 03/19/2022 0442   BUN 43 (H) 03/19/2022 0442   BUN 27 08/31/2020 1211   CREATININE 1.29 (H) 03/19/2022 0442   CREATININE 1.42 (H) 08/09/2019 0903   CALCIUM 8.4 (L) 03/19/2022 0442   PROT 4.6 (L) 03/19/2022 0442   PROT 6.1 06/23/2015 0927   ALBUMIN 2.5 (L) 03/19/2022 0442   ALBUMIN 3.9 06/23/2015 0927   AST 16 03/19/2022 0442   ALT 13 03/19/2022 0442   ALKPHOS 55 03/19/2022 0442   BILITOT 0.3 03/19/2022 0442   BILITOT 0.3 06/23/2015 0927   GFRNONAA 40 (L) 03/19/2022 0442   GFRNONAA 34 (L) 08/09/2019 0903   GFRAA 39 (L) 08/09/2019 0903    GFR Estimated Creatinine Clearance: 28.2 mL/min (A) (by C-G formula based on SCr of 1.29 mg/dL (H)). No results for input(s): "LIPASE", "AMYLASE" in the last 72 hours. No results for input(s): "CKTOTAL", "CKMB", "CKMBINDEX", "TROPONINI" in the last  72 hours. Invalid input(s): "POCBNP" No results for input(s): "DDIMER" in the last 72 hours. No results for input(s): "HGBA1C" in the last 72 hours. No results for input(s): "CHOL", "HDL", "LDLCALC", "TRIG", "CHOLHDL", "LDLDIRECT" in the last 72 hours. No results for input(s): "TSH", "T4TOTAL", "T3FREE", "THYROIDAB" in the last 72 hours.  Invalid input(s): "FREET3" No results for input(s): "VITAMINB12", "FOLATE", "FERRITIN", "TIBC", "IRON", "RETICCTPCT" in the last 72 hours. Coags: No results for input(s): "INR" in the last 72 hours.  Invalid input(s): "PT" Microbiology: Recent Results (from the past 240 hour(s))  Resp panel by RT-PCR (RSV, Flu A&B, Covid) Anterior Nasal Swab     Status: Abnormal   Collection Time: 03/12/22  2:47 PM   Specimen: Anterior Nasal Swab  Result Value Ref Range Status   SARS Coronavirus 2 by RT PCR POSITIVE (A) NEGATIVE Final    Comment:  (NOTE) SARS-CoV-2 target nucleic acids are DETECTED.  The SARS-CoV-2 RNA is generally detectable in upper respiratory specimens during the acute phase of infection. Positive results are indicative of the presence of the identified virus, but do not rule out bacterial infection or co-infection with other pathogens not detected by the test. Clinical correlation with patient history and other diagnostic information is necessary to determine patient infection status. The expected result is Negative.  Fact Sheet for Patients: EntrepreneurPulse.com.au  Fact Sheet for Healthcare Providers: IncredibleEmployment.be  This test is not yet approved or cleared by the [place] FDA and  has been authorized for detection and/or diagnosis of SARS-CoV-2 by FDA under an Emergency Use Authorization (EUA).  This EUA will remain in effect (meaning this test can be used) for the duration of  the COVID-19 declaration under Section 564(b)(1) of the A ct, 21 U.S.C. section 360bbb-3(b)(1), unless the authorization is terminated or revoked sooner.     Influenza A by PCR NEGATIVE NEGATIVE Final   Influenza B by PCR NEGATIVE NEGATIVE Final    Comment: (NOTE) The Xpert Xpress SARS-CoV-2/FLU/RSV plus assay is intended as an aid in the diagnosis of influenza from Nasopharyngeal swab specimens and should not be used as a sole basis for treatment. Nasal washings and aspirates are unacceptable for Xpert Xpress SARS-CoV-2/FLU/RSV testing.  Fact Sheet for Patients: EntrepreneurPulse.com.au  Fact Sheet for Healthcare Providers: IncredibleEmployment.be  This test is not yet approved or cleared by the [place] FDA and has been authorized for detection and/or diagnosis of SARS-CoV-2 by FDA under an Emergency Use Authorization (EUA). This EUA will remain in effect (meaning this test can be used) for the duration of the COVID-19  declaration under Section 564(b)(1) of the Act, 21 U.S.C. section 360bbb-3(b)(1), unless the authorization is terminated or revoked.     Resp Syncytial Virus by PCR NEGATIVE NEGATIVE Final    Comment: (NOTE) Fact Sheet for Patients: EntrepreneurPulse.com.au  Fact Sheet for Healthcare Providers: IncredibleEmployment.be  This test is not yet approved or cleared by the [place] FDA and has been authorized for detection and/or diagnosis of SARS-CoV-2 by FDA under an Emergency Use Authorization (EUA). This EUA will remain in effect (meaning this test can be used) for the duration of the COVID-19 declaration under Section 564(b)(1) of the Act, 21 U.S.C. section 360bbb-3(b)(1), unless the authorization is terminated or revoked.  Performed at [facility], [place] [address] [place], [place] [address]   Urine Culture     Status: Abnormal   Collection Time: 03/12/22  4:48 PM   Specimen: In/Out Cath Urine  Result Value Ref Range Status  Specimen Description IN/OUT CATH URINE  Final   Special Requests   Final    NONE Performed at [facility], [place] [address], [place], [place] [address]    Culture (A)  Final    >=100,000 COLONIES/mL DIPHTHEROIDS(CORYNEBACTERIUM SPECIES) Standardized susceptibility testing for this organism is not available. 20,000 COLONIES/mL ENTEROCOCCUS FAECALIS    Report Status 03/15/2022 FINAL  Final   Organism ID, Bacteria ENTEROCOCCUS FAECALIS (A)  Final      Susceptibility   Enterococcus faecalis - MIC*    AMPICILLIN <=2 SENSITIVE Sensitive     NITROFURANTOIN <=16 SENSITIVE Sensitive     VANCOMYCIN 2 SENSITIVE Sensitive     * 20,000 COLONIES/mL ENTEROCOCCUS FAECALIS    FURTHER DISCHARGE INSTRUCTIONS:  Get Medicines reviewed and adjusted: Please take all your medications with you for your next visit with your Primary MD  Laboratory/radiological data: Please request your Primary MD to go over all  hospital tests and procedure/radiological results at the follow up, please ask your Primary MD to get all Hospital records sent to his/her office.  In some cases, they will be blood work, cultures and biopsy results pending at the time of your discharge. Please request that your primary care M.D. goes through all the records of your hospital data and follows up on these results.  Also Note the following: If you experience worsening of your admission symptoms, develop shortness of breath, life threatening emergency, suicidal or homicidal thoughts you must seek medical attention immediately by calling 911 or calling your MD immediately  if symptoms less severe.  You must read complete instructions/literature along with all the possible adverse reactions/side effects for all the Medicines you take and that have been prescribed to you. Take any new Medicines after you have completely understood and accpet all the possible adverse reactions/side effects.   Do not drive when taking Pain medications or sleeping medications (Benzodaizepines)  Do not take more than prescribed Pain, Sleep and Anxiety Medications. It is not advisable to combine anxiety,sleep and pain medications without talking with your primary care practitioner  Special Instructions: If you have smoked or chewed Tobacco  in the last 2 yrs please stop smoking, stop any regular Alcohol  and or any Recreational drug use.  Wear Seat belts while driving.  Please note: You were cared for by a hospitalist during your hospital stay. Once you are discharged, your primary care physician will handle any further medical issues. Please note that NO REFILLS for any discharge medications will be authorized once you are discharged, as it is imperative that you return to your primary care physician (or establish a relationship with a primary care physician if you do not have one) for your post hospital discharge needs so that they can reassess your need for  medications and monitor your lab values.  Total Time spent coordinating discharge including counseling, education and face to face time equals greater than 30 minutes.  SignedOren Binet 03/21/2022 8:52 AM

## 2022-03-21 NOTE — TOC Transition Note (Signed)
Transition of Care [facility]) - CM/SW Discharge Note   Patient Details  Name: Penny Miller MRN: [id number] Date of Birth: [birth year]  [facility] CM/SW Contact:  Benard Halsted, [place] Phone Number: 03/21/2022, 11:53 AM   Clinical Narrative:    Patient will DC to: [facility] Anticipated DC date: 03/21/22 Family notified: Son, [place] representative Transport by: Corey Harold   Per MD patient ready for DC to [facility]. RN to call report prior to discharge ([phone] room 103). RN, patient, patient's family, and facility notified of DC. Discharge Summary and FL2 sent to facility. DC packet on chart including signed DNR. Ambulance transport requested for patient.   CSW will sign off for now as social work intervention is no longer needed. Please consult [place] again if new needs arise.     Final next level of care: Skilled Nursing Facility Barriers to Discharge: Barriers Resolved   Patient Goals and CMS Choice Patient states their goals for this hospitalization and ongoing recovery are:: to go home CMS Medicare.gov Compare Post Acute Care list provided to:: Patient Choice offered to / list presented to : Patient [place] ownership interest in [facility].provided to:: Patient  Discharge Placement PASRR number recieved: 03/20/22            Patient chooses bed at:  ([facility]) Patient to be transferred to facility by: [place] Name of family member notified: Son Patient and family notified of of transfer: 03/21/22  Discharge Plan and Services In-house Referral: Clinical Social Work              DME Arranged: [facility] DME Agency: [employer] Date DME Agency Contacted: 03/19/22 Time DME Agency Contacted: [phone] Representative spoke with at DME Agency: [place]: PT [place]: [place] Date [place]: 03/19/22 Time [place]: [id number] Representative spoke with at [place]: Tommi Rumps  Social Determinants of Health ([place])  Interventions     Readmission Risk Interventions     No data to display

## 2022-03-22 DIAGNOSIS — J309 Allergic rhinitis, unspecified: Secondary | ICD-10-CM | POA: Diagnosis not present

## 2022-03-22 DIAGNOSIS — H9190 Unspecified hearing loss, unspecified ear: Secondary | ICD-10-CM | POA: Diagnosis not present

## 2022-03-22 DIAGNOSIS — E785 Hyperlipidemia, unspecified: Secondary | ICD-10-CM | POA: Diagnosis not present

## 2022-03-22 DIAGNOSIS — M1990 Unspecified osteoarthritis, unspecified site: Secondary | ICD-10-CM | POA: Diagnosis not present

## 2022-03-22 DIAGNOSIS — K219 Gastro-esophageal reflux disease without esophagitis: Secondary | ICD-10-CM | POA: Diagnosis not present

## 2022-03-22 DIAGNOSIS — E119 Type 2 diabetes mellitus without complications: Secondary | ICD-10-CM | POA: Diagnosis not present

## 2022-03-22 DIAGNOSIS — I495 Sick sinus syndrome: Secondary | ICD-10-CM | POA: Diagnosis not present

## 2022-03-22 DIAGNOSIS — G4700 Insomnia, unspecified: Secondary | ICD-10-CM | POA: Diagnosis not present

## 2022-03-22 DIAGNOSIS — M4800 Spinal stenosis, site unspecified: Secondary | ICD-10-CM | POA: Diagnosis not present

## 2022-03-27 DIAGNOSIS — I4891 Unspecified atrial fibrillation: Secondary | ICD-10-CM | POA: Diagnosis not present

## 2022-03-27 DIAGNOSIS — I495 Sick sinus syndrome: Secondary | ICD-10-CM | POA: Diagnosis not present

## 2022-03-27 DIAGNOSIS — M4800 Spinal stenosis, site unspecified: Secondary | ICD-10-CM | POA: Diagnosis not present

## 2022-03-27 DIAGNOSIS — E119 Type 2 diabetes mellitus without complications: Secondary | ICD-10-CM | POA: Diagnosis not present

## 2022-03-27 DIAGNOSIS — G4700 Insomnia, unspecified: Secondary | ICD-10-CM | POA: Diagnosis not present

## 2022-03-27 DIAGNOSIS — M1990 Unspecified osteoarthritis, unspecified site: Secondary | ICD-10-CM | POA: Diagnosis not present

## 2022-03-27 DIAGNOSIS — E785 Hyperlipidemia, unspecified: Secondary | ICD-10-CM | POA: Diagnosis not present

## 2022-03-27 DIAGNOSIS — J309 Allergic rhinitis, unspecified: Secondary | ICD-10-CM | POA: Diagnosis not present

## 2022-03-27 DIAGNOSIS — K589 Irritable bowel syndrome without diarrhea: Secondary | ICD-10-CM | POA: Diagnosis not present

## 2022-03-27 DIAGNOSIS — G629 Polyneuropathy, unspecified: Secondary | ICD-10-CM | POA: Diagnosis not present

## 2022-03-27 DIAGNOSIS — I10 Essential (primary) hypertension: Secondary | ICD-10-CM | POA: Diagnosis not present

## 2022-03-28 NOTE — Telephone Encounter (Signed)
LM for pt's Son to call back to get her rescheduled.

## 2022-03-28 NOTE — Telephone Encounter (Signed)
Pt is scheduled for 05/15/22. She has an appt with RU on 1/15 and will get labs done that day.

## 2022-04-01 DIAGNOSIS — Z950 Presence of cardiac pacemaker: Secondary | ICD-10-CM

## 2022-04-01 HISTORY — DX: Presence of cardiac pacemaker: Z95.0

## 2022-04-04 ENCOUNTER — Ambulatory Visit (INDEPENDENT_AMBULATORY_CARE_PROVIDER_SITE_OTHER): Payer: Medicare Other | Admitting: Internal Medicine

## 2022-04-04 ENCOUNTER — Ambulatory Visit (INDEPENDENT_AMBULATORY_CARE_PROVIDER_SITE_OTHER): Payer: Medicare Other

## 2022-04-04 ENCOUNTER — Encounter: Payer: Self-pay | Admitting: Internal Medicine

## 2022-04-04 VITALS — BP 138/78 | HR 54 | Temp 98.2°F | Ht 66.0 in | Wt 151.0 lb

## 2022-04-04 DIAGNOSIS — I10 Essential (primary) hypertension: Secondary | ICD-10-CM

## 2022-04-04 DIAGNOSIS — R052 Subacute cough: Secondary | ICD-10-CM | POA: Insufficient documentation

## 2022-04-04 DIAGNOSIS — N184 Chronic kidney disease, stage 4 (severe): Secondary | ICD-10-CM | POA: Diagnosis not present

## 2022-04-04 DIAGNOSIS — R296 Repeated falls: Secondary | ICD-10-CM | POA: Diagnosis not present

## 2022-04-04 DIAGNOSIS — J189 Pneumonia, unspecified organism: Secondary | ICD-10-CM

## 2022-04-04 DIAGNOSIS — E538 Deficiency of other specified B group vitamins: Secondary | ICD-10-CM | POA: Insufficient documentation

## 2022-04-04 DIAGNOSIS — J90 Pleural effusion, not elsewhere classified: Secondary | ICD-10-CM | POA: Diagnosis not present

## 2022-04-04 LAB — BASIC METABOLIC PANEL
BUN: 23 mg/dL (ref 6–23)
CO2: 29 mEq/L (ref 19–32)
Calcium: 9.5 mg/dL (ref 8.4–10.5)
Chloride: 105 mEq/L (ref 96–112)
Creatinine, Ser: 1.53 mg/dL — ABNORMAL HIGH (ref 0.40–1.20)
GFR: 30.19 mL/min — ABNORMAL LOW (ref >60.00)
Glucose, Bld: 148 mg/dL — ABNORMAL HIGH (ref 70–99)
Potassium: 4.5 mEq/L (ref 3.5–5.1)
Sodium: 140 mEq/L (ref 135–145)

## 2022-04-04 LAB — CBC WITH DIFFERENTIAL/PLATELET
Basophils Absolute: 0.1 10*3/uL (ref 0.0–0.1)
Basophils Relative: 0.9 % (ref 0.0–3.0)
Eosinophils Absolute: 0.1 10*3/uL (ref 0.0–0.7)
Eosinophils Relative: 1.8 % (ref 0.0–5.0)
HCT: 36.5 % (ref 36.0–46.0)
Hemoglobin: 11.8 g/dL — ABNORMAL LOW (ref 12.0–15.0)
Lymphocytes Relative: 14.3 % (ref 12.0–46.0)
Lymphs Abs: 1.2 10*3/uL (ref 0.7–4.0)
MCHC: 32.3 g/dL (ref 30.0–36.0)
MCV: 96.5 fl (ref 78.0–100.0)
Monocytes Absolute: 0.8 10*3/uL (ref 0.1–1.0)
Monocytes Relative: 10.3 % (ref 3.0–12.0)
Neutro Abs: 5.9 10*3/uL (ref 1.4–7.7)
Neutrophils Relative %: 72.7 % (ref 43.0–77.0)
Platelets: 176 10*3/uL (ref 150.0–400.0)
RBC: 3.78 Mil/uL — ABNORMAL LOW (ref 3.87–5.11)
RDW: 14.3 % (ref 11.5–15.5)
WBC: 8.1 10*3/uL (ref 4.0–10.5)

## 2022-04-04 NOTE — Progress Notes (Unsigned)
Subjective:  Patient ID: Penny Miller, female    DOB: [birth year]  Age: 89 y.o. MRN: [id number]  CC: Anemia   HPI Penny Miller presents for f/up -    She was admitted for COVID-pneumonia about a month ago.  She was doing well until today when the cough returned.  The cough is productive but she has not seen the phlegm and she denies fever, chills, night sweats, shortness of breath, or wheezing.   Admit Date: 03/12/2022 Discharge date: 03/21/2022   Recommendations for Outpatient Follow-up:  Follow up with PCP in 1-2 weeks Please obtain CMP/CBC in one week Please ensure with follow up EP/cardiology. Repeat two-view chest x-ray in 4 to 6 weeks Assess for home O2 requirement at next follow-up-needing minimal amount of O2 with ambulation.   Admitted From:  Home   Disposition: Skilled nursing facility   Discharge Condition: good   CODE STATUS:   Code Status: DNR    Diet recommendation:  Diet Order                  Diet - low sodium heart healthy             Diet Carb Modified             Diet heart healthy/carb modified Room service appropriate? Yes with Assist; Fluid consistency: Thin  Diet effective now                         Brief Summary: Patient is a 89 y.o.  female with history of persistent atrial fibrillation-not on rate control agents due to bradycardia-PPM being planned in the outpatient setting-presented to the hospital with acute hypoxic respiratory failure in the setting of COVID-19 infection and asthma exacerbation.    Brief Hospital Course: Acute hypoxic respiratory failure due to COVID-19 pneumonia Asthma exacerbation Much improved On room air at rest but requiring around 2 L with ambulation Completed a course of molnupiravir Continue tapering steroids on discharge-continue bronchodilators.   Orthostatic hypotension Likely neurogenic-in the setting of DM-2-worsened by debility/deconditioning/COVID-19 infection Volume status  stable-briefly on IVF that has since been discontinued.  Encourage oral intake Allow permissive hypertension Continue compression stockings   Persistent atrial fibrillation History of bradycardia Avoid rate control agents Unclear if the dizziness she is experiencing is related to these chronic issues-more likely related to ongoing issues with orthostatic hypotension/debility rather than rate issues-as telemetry does not show any significant pauses or bradycardia Evaluated by EP on 12/20-no need for inpatient PPM implantation-okay to follow-up with EP in the outpatient setting once she recovers from acute illness.   Outpatient Medications Prior to Visit  Medication Sig Dispense Refill   ACCU-CHEK AVIVA PLUS test strip USE TO TEST BLOOD SUGAR TWICE DAILY. 100 each 11   acetaminophen (TYLENOL) 500 MG tablet Take 500 mg by mouth every 6 (six) hours as needed (pain).     amLODipine (NORVASC) 2.5 MG tablet Take 2.5 mg by mouth at bedtime.     apixaban (ELIQUIS) 2.5 MG TABS tablet Take 1 tablet (2.5 mg total) by mouth 2 (two) times daily. 180 tablet 0   atorvastatin (LIPITOR) 40 MG tablet TAKE 1 TABLET AT BEDTIME (Patient taking differently: Take 40 mg by mouth daily.) 90 tablet 1   budesonide (PULMICORT) 0.5 MG/2ML nebulizer solution USE 1 VIAL  IN  NEBULIZER TWICE  DAILY - - Rinse Mouth After Treatment (Patient taking differently: Take 0.5 mg by nebulization daily as needed (  for shortness of breath).) 60 mL 5   citalopram (CELEXA) 10 MG tablet TAKE 1 TABLET DAILY (Patient taking differently: Take 10 mg by mouth daily.) 90 tablet 1   Cyanocobalamin (VITAMIN B 12 PO) Take 1 tablet by mouth daily.     famotidine (PEPCID) 20 MG tablet Take one tablet once to twice a day (Patient taking differently: Take 20 mg by mouth daily.) 60 tablet 0   ferrous sulfate 325 (65 FE) MG tablet Take 1 tablet (325 mg total) by mouth 2 (two) times daily with a meal. 180 tablet 1   fluticasone-salmeterol (ADVAIR) 250-50  MCG/ACT AEPB Inhale 1 puff into the lungs in the morning and at bedtime. 60 each 1   gabapentin (NEURONTIN) 300 MG capsule Take 1 capsule (300 mg total) by mouth 3 (three) times daily. 90 capsule 1   hyoscyamine (LEVSIN SL) 0.125 MG SL tablet Place 1 tablet (0.125 mg total) under the tongue every 4 (four) hours as needed. Take tablet every 4-6 hours for abdominal pain and cramping (Patient taking differently: Place 0.125 mg under the tongue every 4 (four) hours as needed for cramping.) 15 tablet 2   ipratropium-albuterol (DUONEB) 0.5-2.5 (3) MG/3ML SOLN USE 1 VIAL IN NEBULIZER EVERY 6 HOURS - And As Needed (Patient taking differently: Inhale 3 mLs into the lungs every 6 (six) hours as needed (for shortness of breath).) 120 mL 5   Lancets (ACCU-CHEK MULTICLIX) lancets Use to test blood sugar twice daily. E11.29 100 each 11   levocetirizine (XYZAL) 5 MG tablet Take 1 tablet (5 mg total) by mouth every evening. 90 tablet 1   lidocaine (LIDODERM) 5 % Place 1 patch onto the skin daily. Remove & Discard patch within 12 hours or as directed by MD (Patient taking differently: Place 1 patch onto the skin daily as needed (For back pain).) 60 patch 1   Nebulizer MISC 1 Act by Does not apply route daily. Dx J45.4 1 each 0   NONFORMULARY OR COMPOUNDED ITEM Transport chair   #1    dx leg and hip pain s/p fall 1 each 0   NONFORMULARY OR COMPOUNDED ITEM Rollator walker with seat    #1   dx gait abnormality 1 each 0   polyethylene glycol powder (GLYCOLAX/MIRALAX) 17 GM/SCOOP powder Take 17 g by mouth daily as needed for mild constipation. 238 g 0   predniSONE (DELTASONE) 10 MG tablet Take 40 mg daily for 1 day, 30 mg daily for 1 day, 20 mg daily for 1 days,10 mg daily for 1 day, then stop 10 tablet 0   thiamine (VITAMIN B-1) 50 MG tablet Take 1 tablet (50 mg total) by mouth daily. 90 tablet 1   triamcinolone cream (KENALOG) 0.5 % APPLY 1 APPLICATION TOPICALLY TWICE A DAY (Patient taking differently: Apply 1 Application  topically daily as needed (Rash).) 450 g 3   Vitamin D, Ergocalciferol, (DRISDOL) 1.25 MG (50000 UNIT) CAPS capsule TAKE 1 CAPSULE EVERY 7 DAYS (Patient taking differently: Take 50,000 Units by mouth every 7 (seven) days.) 12 capsule 0   zinc gluconate 50 MG tablet Take 1 tablet (50 mg total) by mouth daily. 90 tablet 1   No facility-administered medications prior to visit.    ROS Review of Systems  Constitutional:  Positive for fatigue. Negative for appetite change, chills, diaphoresis, fever and unexpected weight change.  HENT: Negative.  Negative for trouble swallowing.   Eyes: Negative.   Respiratory:  Positive for cough. Negative for chest tightness and shortness  of breath.   Cardiovascular:  Negative for chest pain, palpitations and leg swelling.  Gastrointestinal:  Negative for abdominal pain, diarrhea, nausea and vomiting.  Endocrine: Negative.   Genitourinary: Negative.  Negative for difficulty urinating.  Musculoskeletal: Negative.   Skin: Negative.   Neurological:  Positive for weakness. Negative for dizziness.  Hematological:  Negative for adenopathy. Does not bruise/bleed easily.  Psychiatric/Behavioral:  Negative for confusion, decreased concentration and suicidal ideas. The patient is not nervous/anxious.     Objective:  BP 138/78 (BP Location: Right Arm, Patient Position: Sitting, Cuff Size: Large)   Pulse (!) 54   Temp 98.2 F (36.8 C) (Oral)   Ht '5\' 6"'$  (1.676 m)   Wt 151 lb (68.5 kg)   SpO2 96%   BMI 24.37 kg/m   BP Readings from Last 3 Encounters:  04/04/22 138/78  03/21/22 (!) 154/61  03/04/22 128/80    Wt Readings from Last 3 Encounters:  04/04/22 151 lb (68.5 kg)  03/12/22 154 lb 1.6 oz (69.9 kg)  03/04/22 154 lb (69.9 kg)    Physical Exam Vitals reviewed.  Constitutional:      Appearance: She is not ill-appearing.  HENT:     Mouth/Throat:     Mouth: Mucous membranes are moist.  Eyes:     General: No scleral icterus.    Conjunctiva/sclera:  Conjunctivae normal.  Cardiovascular:     Rate and Rhythm: Regular rhythm. Bradycardia present.     Heart sounds: No murmur heard. Pulmonary:     Effort: Pulmonary effort is normal.     Breath sounds: No stridor. No wheezing, rhonchi or rales.  Abdominal:     General: Abdomen is flat.     Palpations: There is no mass.     Tenderness: There is no abdominal tenderness. There is no guarding.     Hernia: No hernia is present.  Musculoskeletal:        General: Normal range of motion.     Cervical back: Neck supple.  Lymphadenopathy:     Cervical: No cervical adenopathy.  Skin:    General: Skin is warm and dry.  Neurological:     General: No focal deficit present.     Mental Status: She is alert.  Psychiatric:        Mood and Affect: Mood normal.        Behavior: Behavior normal.     Lab Results  Component Value Date   WBC 8.1 04/04/2022   HGB 11.8 (L) 04/04/2022   HCT 36.5 04/04/2022   PLT 176.0 04/04/2022   GLUCOSE 148 (H) 04/04/2022   CHOL 166 05/28/2021   TRIG 192.0 (H) 05/28/2021   HDL 44.10 05/28/2021   LDLCALC 84 05/28/2021   ALT 13 03/19/2022   AST 16 03/19/2022   NA 140 04/04/2022   K 4.5 04/04/2022   CL 105 04/04/2022   CREATININE 1.53 (H) 04/04/2022   BUN 23 04/04/2022   CO2 29 04/04/2022   TSH 4.247 03/12/2022   INR 1.2 07/16/2021   HGBA1C 4.9 01/23/2022   MICROALBUR 9.7 (H) 05/28/2021    CT Head Wo Contrast  Result Date: 03/12/2022 CLINICAL DATA:  Mental status change, unknown cause. EXAM: CT HEAD WITHOUT CONTRAST TECHNIQUE: Contiguous axial images were obtained from the base of the skull through the vertex without intravenous contrast. RADIATION DOSE REDUCTION: This exam was performed according to the departmental dose-optimization program which includes automated exposure control, adjustment of the mA and/or kV according to patient size and/or use of  iterative reconstruction technique. COMPARISON:  CT head dated July 16, 2021 FINDINGS: Brain: No  evidence of acute infarction, hemorrhage, hydrocephalus, extra-axial collection or mass lesion/mass effect. Small infarct of the left occipital lobe is unchanged. Advanced chronic microvascular ischemic changes of the periventricular and subcortical white matter. Vascular: No hyperdense vessel or unexpected calcification. Skull: Normal. Negative for fracture or focal lesion. Sinuses/Orbits: No acute finding. Other: None. IMPRESSION: 1. No acute intracranial abnormality. 2. Advanced chronic microvascular ischemic changes of the periventricular and subcortical white matter. Electronically Signed   By: Keane Police D.O.   On: 03/12/2022 17:59   DG Chest 2 View  Result Date: 03/12/2022 CLINICAL DATA:  Cough with altered mental status for 1 day. EXAM: CHEST - 2 VIEW COMPARISON:  Radiographs 02/20/2022 and 07/16/2021.  CT 03/08/2022. FINDINGS: 1522 hours. The heart size and mediastinal contours are stable with aortic atherosclerosis. Unchanged left greater than right pleural effusions with associated left basilar airspace disease. There is possible mild pulmonary edema. No pneumothorax or acute osseous abnormality. Telemetry leads overlie the chest. IMPRESSION: Unchanged left greater than right pleural effusions and left basilar airspace disease. Possible mild pulmonary edema. Electronically Signed   By: Richardean Sale M.D.   On: 03/12/2022 15:44    DG Chest 2 View  Result Date: 04/05/2022 CLINICAL DATA:  89 year old female with a history of cough EXAM: CHEST - 2 VIEW COMPARISON:  03/19/2022 FINDINGS: Cardiomediastinal silhouette unchanged with cardiomegaly. Unchanged position of left chest wall event recorder. Multifocal airspace opacity at the bilateral lung bases, increased from the comparison. Bronchial wall thickening. Mild reticulonodular opacities extending from the hilar regions bilaterally. No pneumothorax. Blunting at the left costophrenic angle and meniscus on the lateral view. Degenerative changes of  the spine.  Osteopenia. No displaced fracture. IMPRESSION: Multifocal pneumonia and trace left-sided parapneumonic effusion. Followup PA and lateral chest X-ray is recommended in 3-4 weeks following therapy to assure resolution. Electronically Signed   By: Corrie Mckusick D.O.   On: 04/05/2022 16:09     Assessment & Plan:   Penny Miller was seen today for anemia.  Diagnoses and all orders for this visit:  Essential hypertension- Her BP is well controlled. -     CBC with Differential/Platelet; Future -     Basic metabolic panel; Future -     Basic metabolic panel -     CBC with Differential/Platelet  Chronic renal disease, stage 4, severely decreased glomerular filtration rate (GFR) between 15-29 mL/min/1.73 square meter ([place])- renal fxn is stable. -     Basic metabolic panel; Future -     Basic metabolic panel  M09 deficiency -     CBC with Differential/Platelet; Future -     CBC with Differential/Platelet  Subacute cough- CXR is positive for PNA -     DG Chest 2 View; Future  Pneumonia of both lungs due to infectious organism, unspecified part of lung- Her PORT score is in the 2-3 range. She has multiple drug allergies. Will treat with a FQ. -     moxifloxacin (AVELOX) 400 MG tablet; Take 1 tablet (400 mg total) by mouth daily for 7 days.   I am having Penny Miller start on moxifloxacin. I am also having her maintain her accu-chek multiclix, Accu-Chek Aviva Plus, hyoscyamine, NONFORMULARY OR COMPOUNDED ITEM, NONFORMULARY OR COMPOUNDED ITEM, triamcinolone cream, acetaminophen, lidocaine, gabapentin, amLODipine, apixaban, atorvastatin, Vitamin D (Ergocalciferol), Cyanocobalamin (VITAMIN B 12 PO), levocetirizine, budesonide, ipratropium-albuterol, thiamine, zinc gluconate, ferrous sulfate, famotidine, citalopram, fluticasone-salmeterol, Nebulizer, polyethylene glycol powder,  and predniSONE.  Meds ordered this encounter  Medications   moxifloxacin (AVELOX) 400 MG tablet    Sig: Take 1  tablet (400 mg total) by mouth daily for 7 days.    Dispense:  7 tablet    Refill:  0     Follow-up: No follow-ups on file.  Scarlette Calico, MD

## 2022-04-05 ENCOUNTER — Telehealth: Payer: Self-pay | Admitting: Internal Medicine

## 2022-04-05 DIAGNOSIS — J189 Pneumonia, unspecified organism: Secondary | ICD-10-CM | POA: Insufficient documentation

## 2022-04-05 MED ORDER — MOXIFLOXACIN HCL 400 MG PO TABS: 400.0000 mg | ORAL_TABLET | Freq: Every day | ORAL | 0 refills | Status: AC

## 2022-04-05 NOTE — Telephone Encounter (Signed)
Patient son called stated she was seen yesterday and had a chest x-ray, results found she has pneumonia , said Dr. Ronnald Ramp sent a message through [facility] asking if she wanted to go back to the hospital or try an antibiotic, states that she would like to try the antibiotic.

## 2022-04-08 ENCOUNTER — Telehealth: Payer: Self-pay | Admitting: Internal Medicine

## 2022-04-08 NOTE — Telephone Encounter (Signed)
Nikki from [facility] called to verify if the pt meets their qualifications for care.  Pt either needs an active cancer dx or needs to be in 6 months of needing hospice care.   Please call Lexine Baton to confirm:  [phone]

## 2022-04-08 NOTE — Telephone Encounter (Signed)
Lvm for Lexine Baton stating that pt is within the 4moneed of hospice care per PCP

## 2022-04-09 ENCOUNTER — Other Ambulatory Visit: Payer: Self-pay | Admitting: Internal Medicine

## 2022-04-09 ENCOUNTER — Telehealth: Payer: Self-pay

## 2022-04-09 DIAGNOSIS — E559 Vitamin D deficiency, unspecified: Secondary | ICD-10-CM

## 2022-04-09 NOTE — Telephone Encounter (Signed)
(  12:47 pm) PC SW telephoned patient's son-[name] to provided education regarding palliative care services and to schedule an initial palliative care visit. [name] advised that patient currently has pneumonia and is taking antibiotics. He requested that the visit be scheduled for next week to give her time to "getting over the pneumonia". RN/SW scheduled to see patient on 04/17/22 @ 2:30 pm.

## 2022-04-13 NOTE — Progress Notes (Unsigned)
Cardiology Office Note Date:  04/15/2022  Patient ID:  Penny Miller 1931/09/01, MRN [id number] PCP:  [name], MD  Cardiologist:  Minus Breeding, MD Electrophysiologist: Cristopher Peru, MD  Chief Complaint: pre-op PPM  History of Present Illness: Penny Miller is a 89 y.o. female with PMH notable for cryptogenic stroke, parox Afib, bradycardia; seen today for Cristopher Peru, MD for pre-operative appointment in anticipation of PPM.   She saw [name] 12/2021 was having symptomatic, parox Afib episodes with very low HR in 42s. PPM offered, and patient called clinic a couple days later wishing to proceed with procedure. Planned for late November 2023. Prior to procedure, patient was not feeling well, and cancelled it.   Admitted Dec 2023 for AMS and encephalopathy, found to be covid + with no bradycardic rates to the low 30s and no significant pauses, so PPM further postponed. DC to SNF.  Since discharge from hospital the patient reports doing much better. She was at rehab facility for 1 week and is now back home. She has had no further syncopal episodes. If she gets up too quickly, or tries to walk quickly, she will get dizzy. HR on home machine has been running mid-40s --50s.  She is diligent about taking eliquis BID, no bleeding concerns.   she denies chest pain, palpitations, dyspnea, PND, orthopnea, nausea, vomiting, syncope, edema, weight gain, or early satiety.    Past Medical History:  Diagnosis Date   Allergic rhinitis, cause unspecified    Anxiety state, unspecified    Arthritis    Colon polyp    Diaphragmatic hernia without mention of obstruction or gangrene    Diverticulosis of colon (without mention of hemorrhage)    Female stress incontinence    HTN (hypertension)    Insomnia, unspecified    Interstitial cystitis    surgery 02/01/2013   Irritable bowel syndrome    Memory loss    Other and unspecified hyperlipidemia    Other premature beats     Polyneuropathy in diabetes(357.2)    Postmenopausal bleeding    Proteinuria    Rectal ulcer    Skin cancer    Stroke ([place]) 2018   Type II or unspecified type diabetes mellitus with neurological manifestations, not stated as uncontrolled(250.60)    Unspecified essential hypertension    Viral warts, unspecified     Past Surgical History:  Procedure Laterality Date   ABDOMINAL HYSTERECTOMY  1977   [place]   CATARACT EXTRACTION, BILATERAL  02/10/2006   Dr.Groat    COLONOSCOPY  07/17/2004   polypectomy   CYSTOSCOPY  1987   Dr.Bradley   LOOP RECORDER INSERTION N/A 07/31/2016   Procedure: Loop Recorder Insertion;  Surgeon: [name], MD;  Location: [facility];  Service: Cardiovascular;  Laterality: N/A;   TEE WITHOUT CARDIOVERSION N/A 07/30/2016   Procedure: TRANSESOPHAGEAL ECHOCARDIOGRAM (TEE);  Surgeon: [name], MD;  Location: [facility] ENDOSCOPY;  Service: Cardiovascular;  Laterality: N/A;   TRIGGER FINGER RELEASE  1990   [place]  05/13/16   urinary bladder      surgery 02/01/2013 to stretch bladder stem    Current Outpatient Medications  Medication Instructions   ACCU-CHEK AVIVA PLUS test strip USE TO TEST BLOOD SUGAR TWICE DAILY.   acetaminophen (TYLENOL) 500 mg, Oral, Every 6 hours PRN   amLODipine (NORVASC) 2.5 mg, Oral, Daily at bedtime   apixaban (ELIQUIS) 2.5 mg, Oral, 2 times daily  atorvastatin (LIPITOR) 40 MG tablet TAKE 1 TABLET AT BEDTIME   budesonide (PULMICORT) 0.5 MG/2ML nebulizer solution USE 1 VIAL  IN  NEBULIZER TWICE  DAILY - - Rinse Mouth After Treatment   citalopram (CELEXA) 10 MG tablet TAKE 1 TABLET DAILY   Cyanocobalamin (VITAMIN B 12 PO) 1 tablet, Oral, Daily   famotidine (PEPCID) 20 MG tablet Take one tablet once to twice a day   gabapentin (NEURONTIN) 300 mg, Oral, 3 times daily   Lancets (ACCU-CHEK MULTICLIX) lancets Use to test blood sugar twice daily. E11.29    NONFORMULARY OR  COMPOUNDED ITEM Transport chair   #1    dx leg and hip pain s/p fall   NONFORMULARY OR COMPOUNDED ITEM Rollator walker with seat    #1   dx gait abnormality   polyethylene glycol powder (GLYCOLAX/MIRALAX) 17 g, Oral, Daily PRN   thiamine (VITAMIN B-1) 50 mg, Oral, Daily   triamcinolone cream (KENALOG) 0.5 % APPLY 1 APPLICATION TOPICALLY TWICE A DAY   Vitamin D (Ergocalciferol) (DRISDOL) 50,000 Units, Oral, Every 7 days   zinc gluconate 50 mg, Oral, Daily   Social History:  The patient  reports that she quit smoking about 11 years ago. Her smoking use included cigarettes. She has a 15.50 pack-year smoking history. She has never used smokeless tobacco. She reports that she does not drink alcohol and does not use drugs.   Family History:  The patient's family history includes Bladder Cancer in her father; COPD in her sister; Diabetes in her brother, brother, and sister; Heart attack in her brother and father; Heart disease in her brother; Heart disease (age of onset: 62) in her brother; Hypertension in her brother, sister, and son; Pancreatic cancer (age of onset: 47) in her mother.  ROS:  Please see the history of present illness. All other systems are reviewed and otherwise negative.   PHYSICAL EXAM:  VS:  BP 139/74   Pulse (!) 53   Ht '5\' 7"'$  (1.702 m)   Wt 149 lb 6.4 oz (67.8 kg)   SpO2 98%   BMI 23.40 kg/m  BMI: Body mass index is 23.4 kg/m.  GEN- The patient is well appearing, alert and oriented x 3 today.   HEENT: normocephalic, atraumatic; sclera clear, conjunctiva pink; hearing intact; oropharynx clear; neck supple, no JVP Lungs- Clear to ausculation bilaterally, normal work of breathing.  No wheezes, rales, rhonchi Heart- Irregularly irregular, bradycardic rate and rhythm, no murmurs, rubs or gallops, PMI not laterally displaced GI- soft, non-tender, non-distended, bowel sounds present, no hepatosplenomegaly Extremities- 1+ peripheral edema. no clubbing or cyanosis; DP/PT/radial  pulses 2+ bilaterally MS- no significant deformity or atrophy Skin- warm and dry, no rash or lesion, ILR device pocket well-healed Psych- euthymic mood, full affect Neuro- strength and sensation are intact  EKG is ordered. Personal review of EKG from today shows:  Fine AF, rate 53bpm  Recent Labs: 03/04/2022: Pro B Natriuretic peptide (BNP) 863.0 03/12/2022: TSH 4.247 03/19/2022: ALT 13; Magnesium 2.1 04/04/2022: BUN 23; Creatinine, Ser 1.53; Hemoglobin 11.8; Platelets 176.0; Potassium 4.5; Sodium 140  05/28/2021: Cholesterol 166; HDL 44.10; LDL Cholesterol 84; Total CHOL/HDL Ratio 4; Triglycerides 192.0; VLDL 38.4   Estimated Creatinine Clearance: 24.7 mL/min (A) (by C-G formula based on SCr of 1.53 mg/dL (H)).   Wt Readings from Last 3 Encounters:  04/15/22 149 lb 6.4 oz (67.8 kg)  04/04/22 151 lb (68.5 kg)  03/12/22 154 lb 1.6 oz (69.9 kg)     Additional studies reviewed include: Previous  EP, cardiology notes.     ASSESSMENT AND PLAN:  #) Bradycardia  parox Afib #) ILR in place, EOS Continues to have symptomatic bradycardia, dizziness No reversible causes identified, no AV nodal agents Plan for PPM soon w [name] The patient also wishes to have ILR removed. I have messaged our scheduler to add procedure to posting. - pre-op labs today  I discussed risks/benefits of procedure with patient and her son. She verbalized understanding and wishes to proceed with procedure. All questions answered  #) Hypercoag d/t Afib  h/o stroke CHA2DS2-VASc Score = 8 [CHF History: 0, HTN History: 1, Diabetes History: 1, Stroke History: 2, Vascular Disease History: 1, Age Score: 2, Gender Score: 1].  Therefore, the patient's annual risk of stroke is 10.8 %.  [place] - eliquis 2.'5mg'$  BID, dose appropriately reduced for age, Cr > 1.5 - updated labs as above Cont [place] now; hold 2 days prior to procedure    Current medicines are reviewed at length with the patient today.   The patient does not  have concerns regarding her medicines.  The following changes were made today:  none  Labs/ tests ordered today include:  Orders Placed This Encounter  Procedures   Basic Metabolic Panel (BMET)   CBC   EKG 12-Lead     Disposition: Follow up with Device clinic 10-14d after procedure and [name] in as usual post procedure   Signed, Mamie Levers, NP  04/15/22 3:26 PM   [address] [place]  [id number] [phone] (office)  [phone] (fax)

## 2022-04-15 ENCOUNTER — Ambulatory Visit: Payer: Medicare Other | Attending: Internal Medicine | Admitting: Cardiology

## 2022-04-15 ENCOUNTER — Encounter: Payer: Self-pay | Admitting: Cardiology

## 2022-04-15 VITALS — BP 139/74 | HR 53 | Ht 67.0 in | Wt 149.4 lb

## 2022-04-15 DIAGNOSIS — I495 Sick sinus syndrome: Secondary | ICD-10-CM | POA: Insufficient documentation

## 2022-04-15 DIAGNOSIS — Z8673 Personal history of transient ischemic attack (TIA), and cerebral infarction without residual deficits: Secondary | ICD-10-CM | POA: Diagnosis not present

## 2022-04-15 DIAGNOSIS — D6869 Other thrombophilia: Secondary | ICD-10-CM | POA: Insufficient documentation

## 2022-04-15 DIAGNOSIS — I480 Paroxysmal atrial fibrillation: Secondary | ICD-10-CM | POA: Diagnosis not present

## 2022-04-15 NOTE — Patient Instructions (Addendum)
Medication Instructions:   Your physician recommends that you continue on your current medications as directed. Please refer to the Current Medication list given to you today.   *If you need a refill on your cardiac medications before your next appointment, please call your pharmacy*   Lab Work:  BMET AND CBC TODAY     If you have labs (blood work) drawn today and your tests are completely normal, you will receive your results only by: MyChart Message (if you have MyChart) OR A paper copy in the mail If you have any lab test that is abnormal or we need to change your treatment, we will call you to review the results.   Testing/Procedures: NONE ORDERED  TODAY      Follow-Up: At [facility], you and your health needs are our priority.  As part of our continuing mission to provide you with exceptional heart care, we have created designated Provider Care Teams.  These Care Teams include your primary Cardiologist (physician) and Advanced Practice Providers (APPs -  Physician Assistants and Nurse Practitioners) who all work together to provide you with the care you need, when you need it.  We recommend signing up for the patient portal called "MyChart".  Sign up information is provided on this After Visit Summary.  MyChart is used to connect with patients for Virtual Visits (Telemedicine).  Patients are able to view lab/test results, encounter notes, upcoming appointments, etc.  Non-urgent messages can be sent to your provider as well.   To learn more about what you can do with MyChart, go to [email or web address].    Your next appointment:  AS SCHEDULED    Other Instructions   

## 2022-04-16 LAB — CBC
Hematocrit: 35.8 % (ref 34.0–46.6)
Hemoglobin: 11.4 g/dL (ref 11.1–15.9)
MCH: 30.2 pg (ref 26.6–33.0)
MCHC: 31.8 g/dL (ref 31.5–35.7)
MCV: 95 fL (ref 79–97)
Platelets: 186 10*3/uL (ref 150–450)
RBC: 3.77 x10E6/uL (ref 3.77–5.28)
RDW: 12.8 % (ref 11.7–15.4)
WBC: 7.2 10*3/uL (ref 3.4–10.8)

## 2022-04-16 LAB — BASIC METABOLIC PANEL
BUN/Creatinine Ratio: 9 — ABNORMAL LOW (ref 12–28)
BUN: 14 mg/dL (ref 8–27)
CO2: 23 mmol/L (ref 20–29)
Calcium: 9.4 mg/dL (ref 8.7–10.3)
Chloride: 105 mmol/L (ref 96–106)
Creatinine, Ser: 1.54 mg/dL — ABNORMAL HIGH (ref 0.57–1.00)
Glucose: 110 mg/dL — ABNORMAL HIGH (ref 70–99)
Potassium: 4.4 mmol/L (ref 3.5–5.2)
Sodium: 141 mmol/L (ref 134–144)
eGFR: 32 mL/min/{1.73_m2} — ABNORMAL LOW (ref >59)

## 2022-04-17 ENCOUNTER — Other Ambulatory Visit: Payer: Medicare Other

## 2022-04-17 ENCOUNTER — Encounter: Payer: Self-pay | Admitting: Oncology

## 2022-04-17 VITALS — BP 90/58 | HR 56 | Resp 18

## 2022-04-17 DIAGNOSIS — Z515 Encounter for palliative care: Secondary | ICD-10-CM

## 2022-04-17 NOTE — Progress Notes (Signed)
Penny Miller Initial Encounter Note      PATIENT NAME: Penny Miller DOB: [birth year] MRN: [id number]  PRIMARY CARE PROVIDER: [name], MD  RESPONSIBLE PARTY:  Acct ID - Guarantor Home Phone Work Phone Relationship Acct Type  [id number] - Penny Miller* [phone]  Self P/F     [address], [place] [address]   RN/LPN/SW completed initial home visit. Son also present    HISTORY OF PRESENT ILLNESS:  89 y.o.  female with history of persistent atrial fibrillation-not on rate control agents due to bradycardia-PPM being planned in the outpatient setting-presented to the hospital with acute hypoxic respiratory failure in the setting of COVID-19 infection and asthma exacerbation. Diagnosed with pneumonia on 04/04/22 and placed on Avelox.    Socially: Lives in her home with son and daughter in law. Lived here for 41 years. Pt has smoked for >60 years. Smoked 4 packs per week. Stopped in Dec 2023.   Cognitive: Alert and oriented x 3.    Appetite: Reports "eating better now". Pt reports losing about 15 pounds in the last month.   Mobility: Uses rollator. Last fall in April 2023 - reinjured fx vertebrae. Difficulty walking immediately after. Weakness noted in legs.    Sleeping Pattern: Pt sleeps at night. Naps often during the day.   GI/GU: occasional incontinence reported at times. Pt reports that she can feel the need to urinate, but just cant get there quick enough. Uses depends.   Pain: c/o vaginal pain at times when she stands to go to the bathroom. Also c/o back pain for which she takes daily gabapentin and Tylenol.   Palliative Care/ Hospice: RN explained role and purpose of palliative care including visit frequency. Also discussed benefits of hospice care as well as the differences between the two with patient.   Palliative Care information packet with contact info given along with MOST Form, Living Will/Advanced directive info.  Goals of Care: Wants to live in  home and die there.    CODE STATUS: Full Code   ADVANCED DIRECTIVES: N  MOST FORM: N PPS:    PHYSICAL EXAM:   VITALS: Today's Vitals   04/17/22 1540  BP: (!) 90/58  Pulse: (!) 56  Resp: 18  SpO2: 96%  PainSc: 0-No pain    LUNGS: clear to auscultation bilat CARDIAC: irregular. No JVD EXTREMITIES: bilat ankles/feet swollen x 1+ L>R SKIN:  WNL  Visit Summary: RN/LPN/SW M.Lonnon met at door by son. Pt sitting in recliner in living room. Alert and pleasant. Answered questions appropriately. Assessment as above. Pt awaiting permanent pacemaker placement in Feb.   Next visit scheduled for 05/15/22'@2p'$    Jacqulyn Cane, RN

## 2022-04-17 NOTE — Progress Notes (Signed)
COMMUNITY PALLIATIVE CARE SW NOTE  PATIENT NAME: Penny Miller DOB: 01/13/1932 MRN: [id number]  PRIMARY CARE PROVIDER: [name], MD  RESPONSIBLE PARTY:  Acct ID - Guarantor Home Phone Work Phone Relationship Acct Type  [id number] - Dise,[name]* [phone]  Self P/F     [address], [place] [address]   SOCIAL WORK INITIAL Palliative Care Visit   SW joint visit with RN-J. Livengood and LPN-D. Georgann Housekeeper for an Initial palliative care visit with patient and her son-[name]. The team provided education to the family regarding palliative care, role in patient's care and visit frequency. The team provided education regarding hospice in which she was open to.  Patient has atrial fibulation. She is scheduled to get a pacemaker on 2/14. She is taking Eloquis. She had respiratory failure around Christmas following a bout with COVID. Patient also had pneumonia in both lungs in early January. She has stage 2/3 Kidney failure. She also has some fluid retention and was treated with lasix. Patient has some mild swelling in her right leg observed in visit today. Patient report her appetite has improved, but has declined overall. She is grazing on foods throughout the day. Patient has lost 15 lbs. in month. Patient is sleeping 12/14 hours a day. Patient report increased fatigue easily with any exertion and has SOB. Patient was smoking prior to COVID, but has not smoked since. Patient's gait is unsteady when she walks and she utilizes a rollator. Patient's last fall was in April which resulted in a hospital visit. Patient has some dizziness when she walks. Patient had physical and occupational therapy with Alvis Lemmings in the past.   Patient's Goal is to remain in her home for along as possible and to die in her home. Patient is also interested in completing advance directives. SW reviewed and left documents: MOST form, Advance Directives and DNR in the home to complete if needed.   SOCIAL HX: Patient was   born and raised in [place]. 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 Patient has been widowed for 15 years. Patient has only one son. Patient's husband is a English as a second language teacher. She is a member of [employer].  Social History   Tobacco Use   Smoking status: Former    Packs/day: 0.25    Years: 62.00    Total pack years: 15.50    Types: Cigarettes    Quit date: 04/02/2011    Years since quitting: 11.0   Smokeless tobacco: Never   Tobacco comments:    stopped consistently smoking in 2012 but still smokes occasionally (one pack per week)  Substance Use Topics   Alcohol use: No    CODE STATUS: Full Code ADVANCED DIRECTIVES: No, documents left in the home MOST FORM COMPLETE: No, document left in the home HOSPICE EDUCATION PROVIDED: Yes., education provided  Duration of visit and documentation: 60 minutes  Nili Honda, LCSW

## 2022-04-24 ENCOUNTER — Telehealth: Payer: Self-pay | Admitting: Internal Medicine

## 2022-04-24 NOTE — Telephone Encounter (Signed)
Caller & Relationship to patient:   Call back number:   Date of last office visit:   Date of next office visit:   Medication(s) to be refilled:  ADVair Disc        Preferred Pharmacy:

## 2022-05-02 ENCOUNTER — Ambulatory Visit (INDEPENDENT_AMBULATORY_CARE_PROVIDER_SITE_OTHER): Payer: Medicare Other | Admitting: Podiatry

## 2022-05-02 DIAGNOSIS — M79674 Pain in right toe(s): Secondary | ICD-10-CM

## 2022-05-02 DIAGNOSIS — E1142 Type 2 diabetes mellitus with diabetic polyneuropathy: Secondary | ICD-10-CM

## 2022-05-02 DIAGNOSIS — M79675 Pain in left toe(s): Secondary | ICD-10-CM | POA: Diagnosis not present

## 2022-05-02 DIAGNOSIS — B351 Tinea unguium: Secondary | ICD-10-CM | POA: Diagnosis not present

## 2022-05-02 NOTE — Progress Notes (Signed)
  Subjective:  Patient ID: Penny Miller, female    DOB: [birth year],  MRN: [id number]  Penny Miller presents to clinic today for:  Chief Complaint  Patient presents with   Nail Problem    Nail trim    New problem(s): None.   Patient is accompanied by her granddaughter, Penny Miller, on today's visit.  PCP is [name], MD , and last visit was  September 24, 2021.  Allergies  Allergen Reactions   Adhesive [Tape] Rash and Other (See Comments)    Tears the skin also   Ceclor [Cefaclor] Other (See Comments)    Unknown reaction; not recalled   Codeine Nausea And Vomiting   Jardiance [Empagliflozin] Diarrhea   Lopid [Gemfibrozil] Other (See Comments)    Unknown reaction; not recalled   Metformin And Related Diarrhea   Metoprolol Nausea And Vomiting   Morphine And Related Other (See Comments)    Passed out   Niacin And Related Other (See Comments)    Unknown reaction   Relafen [Nabumetone] Nausea And Vomiting   Septra [Sulfamethoxazole-Trimethoprim] Nausea And Vomiting   Tetracyclines & Related Nausea And Vomiting   Review of Systems: Negative except as noted in the HPI.  Objective: No changes noted in today's physical examination.  Penny Miller is a pleasant 89 y.o. female in NAD. AAO x 3.  Vascular Examination: CFT <4 seconds b/l LE. Palpable DP pulse(s) b/l LE. Palpable PT pulse(s) b/l LE. Pedal hair absent. No pain with calf compression RLE. Trace edema noted BLE. Varicosities present b/l. No cyanosis or clubbing noted b/l LE.  Dermatological Examination: Pedal skin is warm and supple b/l LE. No open wounds b/l LE. No interdigital macerations noted b/l LE. Toenails 1-5 b/l elongated, discolored, dystrophic, thickened, crumbly with subungual debris and tenderness to dorsal palpation.   Resolving hyperkeratotic lesion(s) bilateral great toes.   Resolved porokeratotic lesions plantar right heel with tenderness to palpation. No erythema, no edema, no drainage, no  flocculence.  No erythema, no edema, no drainage, no fluctuance.  Musculoskeletal Examination: Muscle strength 5/5 to all lower extremity muscle groups bilaterally. Wearing Hey Dude shoes. No pain, crepitus or joint limitation noted with ROM bilateral LE. No gross bony deformities bilaterally. Utilizes rollator for ambulation assistance.  Neurological Examination: Pt has subjective symptoms of neuropathy. Protective sensation intact 5/5 intact bilaterally with 10g monofilament b/l. Vibratory sensation intact b/l. Proprioception intact bilaterally.  Assessment/Plan: 1. Pain due to onychomycosis of toenails of both feet   2. Diabetic peripheral neuropathy (HCC)      No orders of the defined types were placed in this encounter.   -Patient's family member present. All questions/concerns addressed on today's visit. -Examined patient. -Continue foot and shoe inspections daily. Monitor blood glucose per PCP/Endocrinologist's recommendations. -Patient to continue soft, supportive shoe gear daily. -Mycotic toenails 1-5 bilaterally were debrided in length and girth with sterile nail nippers and dremel without incident. -Patient/POA to call should there be question/concern in the interim.   No follow-ups on file.  Felipa Furnace, DPM

## 2022-05-03 ENCOUNTER — Ambulatory Visit: Payer: Medicare Other | Admitting: Podiatry

## 2022-05-06 ENCOUNTER — Other Ambulatory Visit: Payer: Self-pay | Admitting: *Deleted

## 2022-05-06 NOTE — Telephone Encounter (Signed)
fluticasone-salmeterol (ADVAIR) 250-50 MCG/ACT [facility]EP[facility] refill request came in - this was discontinued in Jan 2024 and taken off med list

## 2022-05-09 ENCOUNTER — Encounter (HOSPITAL_COMMUNITY): Payer: Self-pay | Admitting: *Deleted

## 2022-05-14 NOTE — Pre-Procedure Instructions (Signed)
Instructed patient on the following items: Arrival time 0800 Nothing to eat or drink after midnight No meds AM of procedure Responsible person to drive you home and stay with you for 24 hrs Wash with special soap night before and morning of procedure If on anti-coagulant drug instructions Eliquis- last dose was 2/11

## 2022-05-15 ENCOUNTER — Ambulatory Visit (HOSPITAL_COMMUNITY): Payer: Medicare Other

## 2022-05-15 ENCOUNTER — Other Ambulatory Visit: Payer: Self-pay

## 2022-05-15 ENCOUNTER — Ambulatory Visit (HOSPITAL_COMMUNITY)
Admission: RE | Admit: 2022-05-15 | Discharge: 2022-05-15 | Disposition: A | Discharge status: Home / Self Care | Payer: Medicare Other | Attending: Internal Medicine | Admitting: Internal Medicine

## 2022-05-15 ENCOUNTER — Encounter (HOSPITAL_COMMUNITY)
Admission: RE | Disposition: A | Discharge status: Home / Self Care | Payer: Self-pay | Source: Home / Self Care | Attending: Internal Medicine

## 2022-05-15 DIAGNOSIS — R0600 Dyspnea, unspecified: Secondary | ICD-10-CM | POA: Diagnosis not present

## 2022-05-15 DIAGNOSIS — Z8673 Personal history of transient ischemic attack (TIA), and cerebral infarction without residual deficits: Secondary | ICD-10-CM | POA: Diagnosis not present

## 2022-05-15 DIAGNOSIS — I442 Atrioventricular block, complete: Secondary | ICD-10-CM | POA: Insufficient documentation

## 2022-05-15 DIAGNOSIS — I480 Paroxysmal atrial fibrillation: Secondary | ICD-10-CM | POA: Insufficient documentation

## 2022-05-15 DIAGNOSIS — I495 Sick sinus syndrome: Secondary | ICD-10-CM | POA: Insufficient documentation

## 2022-05-15 DIAGNOSIS — Z950 Presence of cardiac pacemaker: Secondary | ICD-10-CM | POA: Diagnosis not present

## 2022-05-15 DIAGNOSIS — Z7901 Long term (current) use of anticoagulants: Secondary | ICD-10-CM | POA: Insufficient documentation

## 2022-05-15 HISTORY — PX: PACEMAKER IMPLANT: EP1218

## 2022-05-15 HISTORY — PX: LOOP RECORDER REMOVAL: EP1215

## 2022-05-15 LAB — GLUCOSE, CAPILLARY
Glucose-Capillary: 98 mg/dL (ref 70–99)
Glucose-Capillary: 143 mg/dL — ABNORMAL HIGH (ref 70–99)

## 2022-05-15 SURGERY — PACEMAKER IMPLANT
Anesthesia: LOCAL

## 2022-05-15 MED ORDER — IOHEXOL 350 MG/ML SOLN
INTRAVENOUS | Status: DC | PRN
  Administered 2022-05-15: 10 mL

## 2022-05-15 MED ORDER — POVIDONE-IODINE 10 % EX SWAB
2.0000 | Freq: Once | CUTANEOUS | Status: AC
  Administered 2022-05-15: 2 via TOPICAL

## 2022-05-15 MED ORDER — FENTANYL CITRATE (PF) 100 MCG/2ML IJ SOLN
INTRAMUSCULAR | Status: AC
  Filled 2022-05-15: qty 2

## 2022-05-15 MED ORDER — HEPARIN (PORCINE) IN NACL 1000-0.9 UT/500ML-% IV SOLN
INTRAVENOUS | Status: DC | PRN
  Administered 2022-05-15: 500 mL

## 2022-05-15 MED ORDER — CHLORHEXIDINE GLUCONATE 4 % EX LIQD: 4.0000 | Freq: Once | CUTANEOUS | Status: DC

## 2022-05-15 MED ORDER — SODIUM CHLORIDE 0.9 % IV SOLN
INTRAVENOUS | Status: AC
  Filled 2022-05-15: qty 2

## 2022-05-15 MED ORDER — LIDOCAINE HCL (PF) 1 % IJ SOLN
INTRAMUSCULAR | Status: AC
  Filled 2022-05-15: qty 60

## 2022-05-15 MED ORDER — SODIUM CHLORIDE 0.9 % IV SOLN
80.0000 mg | INTRAVENOUS | Status: AC
  Administered 2022-05-15: 80 mg

## 2022-05-15 MED ORDER — LIDOCAINE HCL (PF) 1 % IJ SOLN
INTRAMUSCULAR | Status: DC | PRN
  Administered 2022-05-15: 50 mL
  Administered 2022-05-15: 5 mL

## 2022-05-15 MED ORDER — MIDAZOLAM HCL 5 MG/5ML IJ SOLN
INTRAMUSCULAR | Status: AC
  Filled 2022-05-15: qty 5

## 2022-05-15 MED ORDER — AMLODIPINE BESYLATE 5 MG PO TABS
2.5000 mg | ORAL_TABLET | Freq: Once | ORAL | Status: AC
  Administered 2022-05-15: 2.5 mg via ORAL
  Filled 2022-05-15: qty 0.5

## 2022-05-15 MED ORDER — SODIUM CHLORIDE 0.9 % IV SOLN: INTRAVENOUS | Status: DC

## 2022-05-15 MED ORDER — ONDANSETRON HCL 4 MG/2ML IJ SOLN: 4.0000 mg | Freq: Four times a day (QID) | INTRAMUSCULAR | Status: DC | PRN

## 2022-05-15 MED ORDER — VANCOMYCIN HCL IN DEXTROSE 1-5 GM/200ML-% IV SOLN
INTRAVENOUS | Status: AC
  Filled 2022-05-15: qty 200

## 2022-05-15 MED ORDER — VANCOMYCIN HCL IN DEXTROSE 1-5 GM/200ML-% IV SOLN
1000.0000 mg | Freq: Once | INTRAVENOUS | Status: AC
  Administered 2022-05-15: 1000 mg via INTRAVENOUS
  Filled 2022-05-15: qty 200

## 2022-05-15 MED ORDER — MIDAZOLAM HCL 5 MG/5ML IJ SOLN
INTRAMUSCULAR | Status: DC | PRN
  Administered 2022-05-15: 1 mg via INTRAVENOUS

## 2022-05-15 MED ORDER — FENTANYL CITRATE (PF) 100 MCG/2ML IJ SOLN
INTRAMUSCULAR | Status: DC | PRN
  Administered 2022-05-15: 12.5 ug via INTRAVENOUS

## 2022-05-15 MED ORDER — ACETAMINOPHEN 325 MG PO TABS: 325.0000 mg | ORAL_TABLET | ORAL | Status: DC | PRN

## 2022-05-15 MED ORDER — VANCOMYCIN HCL IN DEXTROSE 1-5 GM/200ML-% IV SOLN
1000.0000 mg | INTRAVENOUS | Status: AC
  Administered 2022-05-15: 1000 mg via INTRAVENOUS

## 2022-05-15 SURGICAL SUPPLY — 14 items
CABLE SURGICAL S-101-97-12 (CABLE) ×1 IMPLANT
CATH RIGHTSITE C315HIS02 (CATHETERS) IMPLANT
IPG PACE AZUR XT DR MRI W1DR01 (Pacemaker) IMPLANT
KIT MICROPUNCTURE NIT STIFF (SHEATH) IMPLANT
LEAD CAPSURE NOVUS 5076-52CM (Lead) IMPLANT
LEAD SELECT SECURE 3830 383069 (Lead) IMPLANT
PACE AZURE XT DR MRI W1DR01 (Pacemaker) ×1 IMPLANT
PAD DEFIB RADIO PHYSIO CONN (PAD) ×1 IMPLANT
SELECT SECURE 3830 383069 (Lead) ×1 IMPLANT
SHEATH 7FR PRELUDE SNAP 13 (SHEATH) IMPLANT
SLITTER 6232ADJ (MISCELLANEOUS) IMPLANT
TRAY PACEMAKER INSERTION (PACKS) ×1 IMPLANT
WIRE HI TORQ VERSACORE-J 145CM (WIRE) IMPLANT
WIRE MICRO SET SILHO 5FR 7 (SHEATH) IMPLANT

## 2022-05-15 NOTE — H&P (Signed)
HPI Penny Miller returns today for followup. She has a h/o cryptogenic stroke and was found to have PAF. I saw her a couple of months ago and she had experienced unexplained bradycardia. She wore a cardiac zio monitor demonstrating daytime episodes of bradycardia when she is in atrial fib which she is about 5% of the time. She returns to discuss additional treatment. She had symptomatic brady with her atrial fib with rates down into the 30's. These episodes last seconds to a minute and resolve. She has not had syncope recently.       Allergies  Allergen Reactions   Jardiance [Empagliflozin] Diarrhea   Metformin And Related Diarrhea   Codeine Nausea And Vomiting   Metoprolol Nausea And Vomiting   Relafen [Nabumetone] Nausea And Vomiting   Septra [Sulfamethoxazole-Trimethoprim] Nausea And Vomiting   Tetracycline Nausea And Vomiting   Adhesive [Tape] Rash and Other (See Comments)      Tears the skin also   Ceclor [Cefaclor] Other (See Comments)      Unknown reaction; not recalled   Cefaclor Other (See Comments)      Unknown reaction   Codeine Nausea And Vomiting   Jardiance [Empagliflozin] Diarrhea   Lopid [Gemfibrozil] Other (See Comments)      Unknown reaction; not recalled   Lopid [Gemfibrozil] Other (See Comments)      Unknown reaction   Lopressor [Metoprolol Tartrate] Nausea And Vomiting   Metformin And Related Diarrhea   Morphine And Related Other (See Comments)      Passed out   Morphine And Related Other (See Comments)      Passed out   Niacin Nausea And Vomiting      Unknown reaction Unknown reaction; not recalled   Niacin And Related Other (See Comments)      Unknown reaction; not recalled   Niacin And Related Other (See Comments)      Unknown reaction   Relafen [Nabumetone] Nausea And Vomiting   Septra [Sulfamethoxazole-Trimethoprim] Nausea And Vomiting   Tape Rash and Other (See Comments)      Tears skin   Tetracyclines & Related Nausea And Vomiting    Toprol Xl [Metoprolol Tartrate] Nausea And Vomiting   Toprol Xl [Metoprolol] Nausea And Vomiting              Current Outpatient Medications  Medication Sig Dispense Refill   ACCU-CHEK AVIVA PLUS test strip USE TO TEST BLOOD SUGAR TWICE DAILY. 100 each 11   acetaminophen (TYLENOL) 500 MG tablet Take 500 mg by mouth every 6 (six) hours as needed (pain).       amLODipine (NORVASC) 2.5 MG tablet Take 2.5 mg by mouth at bedtime.       apixaban (ELIQUIS) 2.5 MG TABS tablet Take 1 tablet (2.5 mg total) by mouth 2 (two) times daily. 180 tablet 0   atorvastatin (LIPITOR) 40 MG tablet TAKE 1 TABLET AT BEDTIME 90 tablet 1   budesonide (PULMICORT) 0.5 MG/2ML nebulizer solution Take 2 mLs (0.5 mg total) by nebulization 2 (two) times daily. 120 mL 5   citalopram (CELEXA) 10 MG tablet TAKE 1 TABLET DAILY 90 tablet 1   Cyanocobalamin (VITAMIN B 12 PO) Take by mouth.       gabapentin (NEURONTIN) 300 MG capsule Take 1 capsule (300 mg total) by mouth 3 (three) times daily. 90 capsule 1   hyoscyamine (LEVSIN SL) 0.125 MG SL tablet Place 1 tablet (0.125 mg total) under the tongue every 4 (  four) hours as needed. Take tablet every 4-6 hours for abdominal pain and cramping 15 tablet 2   ipratropium-albuterol (DUONEB) 0.5-2.5 (3) MG/3ML SOLN Take 3 mLs by nebulization every 6 (six) hours as needed. Via nebulizer and as needed 450 mL 2   Lancets (ACCU-CHEK MULTICLIX) lancets Use to test blood sugar twice daily. E11.29 100 each 11   levocetirizine (XYZAL) 5 MG tablet TAKE 1 TABLET EVERY EVENING 90 tablet 3   lidocaine (LIDODERM) 5 % Place 1 patch onto the skin daily. Remove & Discard patch within 12 hours or as directed by MD 60 patch 1   Nebulizer MISC 1 Act by Does not apply route daily. 1 each 2   NONFORMULARY OR COMPOUNDED ITEM Transport chair   #1    dx leg and hip pain s/p fall 1 each 0   NONFORMULARY OR COMPOUNDED ITEM Rollator walker with seat    #1   dx gait abnormality 1 each 0   ondansetron (ZOFRAN ODT) 4  MG disintegrating tablet Take 1 tablet (4 mg total) by mouth every 6 (six) hours. 15 tablet 5   pioglitazone (ACTOS) 15 MG tablet Take 15 mg by mouth daily.       triamcinolone cream (KENALOG) 0.5 % APPLY 1 APPLICATION TOPICALLY TWICE A DAY 450 g 3   Vitamin D, Ergocalciferol, (DRISDOL) 1.25 MG (50000 UNIT) CAPS capsule TAKE 1 CAPSULE EVERY 7 DAYS 12 capsule 0    No current facility-administered medications for this visit.            Past Medical History:  Diagnosis Date   Allergic rhinitis, cause unspecified     Anxiety state, unspecified     Arthritis     Colon polyp     Diaphragmatic hernia without mention of obstruction or gangrene     Diverticulosis of colon (without mention of hemorrhage)     Female stress incontinence     HTN (hypertension)     Insomnia, unspecified     Interstitial cystitis      surgery 02/01/2013   Irritable bowel syndrome     Memory loss     Other and unspecified hyperlipidemia     Other premature beats     Polyneuropathy in diabetes(357.2)     Postmenopausal bleeding     Proteinuria     Rectal ulcer     Skin cancer     Stroke ([place]) 2018   Type II or unspecified type diabetes mellitus with neurological manifestations, not stated as uncontrolled(250.60)     Unspecified essential hypertension     Viral warts, unspecified        ROS:    All systems reviewed and negative except as noted in the HPI.          Past Surgical History:  Procedure Laterality Date   ABDOMINAL HYSTERECTOMY   1977    [place]   CATARACT EXTRACTION, BILATERAL   02/10/2006    Dr.Groat    COLONOSCOPY   07/17/2004    polypectomy   CYSTOSCOPY   1987    Dr.Bradley   LOOP RECORDER INSERTION N/A 07/31/2016    Procedure: Loop Recorder Insertion;  Surgeon: [name], MD;  Location: [facility];  Service: Cardiovascular;  Laterality: N/A;   TEE WITHOUT CARDIOVERSION N/A 07/30/2016    Procedure: TRANSESOPHAGEAL ECHOCARDIOGRAM (TEE);  Surgeon:  [name], MD;  Location: [facility];  Service: Cardiovascular;  Laterality: N/A;   TRIGGER FINGER RELEASE  1990    Dr.Carter   TRIGGER FINGER RELEASE   05/13/16   urinary bladder         surgery 02/01/2013 to stretch bladder stem             Family History  Problem Relation Age of Onset   Pancreatic cancer Mother 50   Heart attack Father     Bladder Cancer Father     Hypertension Sister     Diabetes Sister     COPD Sister     Hypertension Son     Diabetes Brother     Heart disease Brother     Hypertension Brother     Diabetes Brother     Heart disease Brother 78        Myocardial Infarction    Heart attack Brother          Social History         Socioeconomic History   Marital status: Widowed      Spouse name: Not on file   Number of children: 1   Years of education: Not on file   Highest education level: Not on file  Occupational History   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   Tobacco Use   Smoking status: Former      Packs/day: 0.25      Years: 62.00      Total pack years: 15.50      Types: Cigarettes      Quit date: 04/02/2011      Years since quitting: 10.8   Smokeless tobacco: Never   Tobacco comments:      stopped consistently smoking in 2012 but still smokes occasionally (one pack per week)  Vaping Use   Vaping Use: Never used  Substance and Sexual Activity   Alcohol use: No   Drug use: No   Sexual activity: Not Currently  Other Topics Concern   Not on file  Social History Narrative    Ophthalmologist-Dr.Gould and Dr.Groat    Podiatrist- Dr.Tuchman    Dermatologist- Dr.Lupton    Urologist- Dr. Lawerance Bach     Lives with her son.     Social Determinants of Health        Financial Resource Strain: Low Risk  (08/30/2020)    Overall Financial Resource Strain (CARDIA)     Difficulty of Paying Living Expenses: Not hard at all  Food Insecurity: No Food Insecurity (08/30/2020)    Hunger Vital Sign     Worried About Running  Out of Food in the Last Year: Never true     Ran Out of Food in the Last Year: Never true  Transportation Needs: No Transportation Needs (08/30/2020)    PRAPARE - Armed forces logistics/support/administrative officer (Medical): No     Lack of Transportation (Non-Medical): No  Physical Activity: Inactive (08/30/2020)    Exercise Vital Sign     Days of Exercise per Week: 0 days     Minutes of Exercise per Session: 0 min  Stress: No Stress Concern Present (08/30/2020)    [place]     Feeling of Stress : Not at all  Social Connections: Moderately Integrated (08/30/2020)    Social Connection and Isolation Panel [NHANES]     Frequency of Communication with Friends and Family: More than three times a week     Frequency of Social Gatherings with Friends and Family: Three times a  week     Attends Religious Services: More than 4 times per year     Active Member of Clubs or Organizations: No     Attends Archivist Meetings: More than 4 times per year     Marital Status: Widowed  Intimate Partner Violence: Not At Risk (08/30/2020)    Humiliation, Afraid, Rape, and Kick questionnaire     Fear of Current or Ex-Partner: No     Emotionally Abused: No     Physically Abused: No     Sexually Abused: No        BP (!) 132/58   Pulse 60   Ht 5' 7"$  (1.702 m)   Wt 155 lb (70.3 kg)   SpO2 94%   BMI 24.28 kg/m    Physical Exam:   Well appearing NAD HEENT: Unremarkable Neck:  No JVD, no thyromegally Lymphatics:  No adenopathy Back:  No CVA tenderness Lungs:  Clear HEART:  Regular rate rhythm, no murmurs, no rubs, no clicks Abd:  soft, positive bowel sounds, no organomegally, no rebound, no guarding Ext:  2 plus pulses, no edema, no cyanosis, no clubbing Skin:  No rashes no nodules Neuro:  CN II through XII intact, motor grossly intact   EKG - nsr   Assess/Plan:  PAF - she has periods of very slow VR in the 30's for which she is  symptomatic. I have offered her PPM insertion. She will call us if she wishes to proceed. Symptomatic bradycardia - see above. Interestingly this is occurring when she is in atrial fib.  Chronic anti-coagulation - we would have her hold her blood thinner for 2 days prior to the procedure. Dyspnea - this correlates with her atrial fib. I would consider AA drug therapy after PPM insertion. Carleene Overlie [name] Talarico,MD

## 2022-05-15 NOTE — Discharge Instructions (Addendum)
After Your Pacemaker   You have a Medtronic Pacemaker  ACTIVITY Do not lift your arm above shoulder height for 1 week after your procedure. After 7 days, you may progress as below.  You should remove your sling 24 hours after your procedure, unless otherwise instructed by your provider.     Wednesday May 22, 2022  Thursday May 23, 2022 Friday May 24, 2022 Saturday May 25, 2022   Do not lift, push, pull, or carry anything over 10 pounds with the affected arm until 6 weeks (Wednesday June 26, 2022 ) after your procedure.   You may drive AFTER your wound check, unless you have been told otherwise by your provider.   Ask your healthcare provider when you can go back to work   INCISION/Dressing If you are on a blood thinner such as Coumadin, Xarelto, Eliquis, Plavix, or Pradaxa please confirm with your provider when this should be resumed. 05/20/2022  If large square, outer bandage is left in place, this can be removed after 24 hours from your procedure. Do not remove steri-strips or glue as below.   Monitor your Pacemaker site for redness, swelling, and drainage. Call the device clinic at [phone] if you experience these symptoms or fever/chills.  If your incision is sealed with Steri-strips or staples, you may shower 7 days after your procedure or when told by your provider. Do not remove the steri-strips or let the shower hit directly on your site. You may wash around your site with soap and water.    If you were discharged in a sling, please do not wear this during the day more than 48 hours after your surgery unless otherwise instructed. This may increase the risk of stiffness and soreness in your shoulder.   Avoid lotions, ointments, or perfumes over your incision until it is well-healed.  You may use a hot tub or a pool AFTER your wound check appointment if the incision is completely closed.  Pacemaker Alerts:  Some alerts are vibratory and others beep.  These are NOT emergencies. Please call our office to let us know. If this occurs at night or on weekends, it can wait until the next business day. Send a remote transmission.  If your device is capable of reading fluid status (for heart failure), you will be offered monthly monitoring to review this with you.   DEVICE MANAGEMENT Remote monitoring is used to monitor your pacemaker from home. This monitoring is scheduled every 91 days by our office. It allows [place] to keep an eye on the functioning of your device to ensure it is working properly. You will routinely see your Electrophysiologist annually (more often if necessary).   You should receive your ID card for your new device in 4-8 weeks. Keep this card with you at all times once received. Consider wearing a medical alert bracelet or necklace.  Your Pacemaker may be MRI compatible. This will be discussed at your next office visit/wound check.  You should avoid contact with strong electric or magnetic fields.   Do not use amateur (ham) radio equipment or electric (arc) welding torches. MP3 player headphones with magnets should not be used. Some devices are safe to use if held at least 12 inches (30 cm) from your Pacemaker. These include power tools, lawn mowers, and speakers. If you are unsure if something is safe to use, ask your health care provider.  When using your cell phone, hold it to the ear that is on the opposite side from the  Pacemaker. Do not leave your cell phone in a pocket over the Pacemaker.  You may safely use electric blankets, heating pads, computers, and microwave ovens.  Call the office right away if: You have chest pain. You feel more short of breath than you have felt before. You feel more light-headed than you have felt before. Your incision starts to open up.  This information is not intended to replace advice given to you by your health care provider. Make sure you discuss any questions you have with your health care  provider.

## 2022-05-16 ENCOUNTER — Telehealth: Payer: Self-pay | Admitting: Internal Medicine

## 2022-05-16 ENCOUNTER — Telehealth: Payer: Self-pay

## 2022-05-16 ENCOUNTER — Encounter (HOSPITAL_COMMUNITY): Payer: Self-pay | Admitting: Internal Medicine

## 2022-05-16 NOTE — Telephone Encounter (Signed)
-----   Message from Shirley Friar, PA-C sent at 05/15/2022  1:36 PM EST ----- Regarding: Same day discharge Same day PPM GT 05/15/2022

## 2022-05-16 NOTE — Telephone Encounter (Signed)
Patient's son called and said patient hasn't been taking pioglitazone (ACTOS) 15 MG tablet for months. He would like it to be sent into [facility], [place]. He said her sugar has been high. Best callback number is [phone].

## 2022-05-16 NOTE — Telephone Encounter (Addendum)
Follow-up after same day discharge: Implant date: 05/15/22  MD: Cristopher Peru, MD Device: MDT dual chamber PPM  Location: left chest    Wound check visit: 05/29/22 90 day MD follow-up: 08/22/22 with [name]   Remote Transmission received:In process of set up in [place]   Dressing/sling removed: Removing tonight  Confirm [place] restart on: Patient is aware per AVS to restart on 05/20/22.   Has device clinic number if any questions or concerns.

## 2022-05-17 ENCOUNTER — Other Ambulatory Visit: Payer: Self-pay | Admitting: Internal Medicine

## 2022-05-20 ENCOUNTER — Other Ambulatory Visit: Payer: Self-pay | Admitting: Radiology

## 2022-05-20 MED ORDER — DAPAGLIFLOZIN PROPANEDIOL 10 MG PO TABS: 10.0000 mg | ORAL_TABLET | Freq: Every day | ORAL | 1 refills | Status: DC

## 2022-05-20 NOTE — Telephone Encounter (Signed)
Spoke with Pt son and understood message but wanted to know if there is an alternative she should be taking for her DM.

## 2022-05-20 NOTE — Telephone Encounter (Signed)
Ok to try farxiga 10 mg per day - done erx, to help control sugar as well as help the kidneys from getting worse

## 2022-05-20 NOTE — Telephone Encounter (Signed)
LVM for pt son to call back about providers message.

## 2022-05-20 NOTE — Telephone Encounter (Signed)
Spoke with pt son and understood with no further questions.

## 2022-05-20 NOTE — Telephone Encounter (Signed)
Pt son Myrtha Holderby returning nurse phone call please give pt a call back.

## 2022-05-29 ENCOUNTER — Ambulatory Visit: Payer: Medicare Other | Attending: Interventional Cardiology

## 2022-05-29 DIAGNOSIS — I495 Sick sinus syndrome: Secondary | ICD-10-CM | POA: Diagnosis present

## 2022-05-29 NOTE — Patient Instructions (Addendum)
   After Your Pacemaker   Monitor your pacemaker site for redness, swelling, and drainage. Call the device clinic at [phone] if you experience these symptoms or fever/chills.  You have an area of your wound that continues to heal.  A stitch was removed from the area today and should allow for the area to continue to heal now.  Cleanse the area with Dial soap and water using clean cloth to wash and dry area.  Showers are okay to resume at this point.  Area is covered today with 2x2 and surgical tape, okay to remove and leave open to air and wash and dry as prescribed above.  Monitor for signs of infection and contact our office if any concerns.  Return in 1 week on:  MARCH 7TH 2:00 PM.   Your incision was closed with Steri-strips or staples:  You may shower 7 days after your procedure and wash your incision with soap and water. Avoid lotions, ointments, or perfumes over your incision until it is well-healed.  You may use a hot tub or a pool after your wound check appointment if the incision is completely closed.  Do not lift, push or pull greater than 10 pounds with the affected arm until 6 weeks after your procedure. UNTIL AFTER MARCH 27 There are no other restrictions in arm movement after your wound check appointment.  You may drive, unless driving has been restricted by your healthcare providers.  Remote monitoring is used to monitor your pacemaker from home. This monitoring is scheduled every 91 days by our office. It allows [place] to keep an eye on the functioning of your device to ensure it is working properly. You will routinely see your Electrophysiologist annually (more often if necessary).

## 2022-05-31 LAB — CUP PACEART INCLINIC DEVICE CHECK
Battery Remaining Longevity: 149 mo
Battery Voltage: 3.21 V
Brady Statistic AP VP Percent: 19.2 %
Brady Statistic AP VS Percent: 51.79 %
Brady Statistic AS VP Percent: 3.64 %
Brady Statistic AS VS Percent: 25.33 %
Brady Statistic RA Percent Paced: 59.42 %
Brady Statistic RV Percent Paced: 32.78 %
Date Time Interrogation Session: 20240228150600
Implantable Lead Connection Status: 753985
Implantable Lead Connection Status: 753985
Implantable Lead Implant Date: 20240214
Implantable Lead Implant Date: 20240214
Implantable Lead Location: 753859
Implantable Lead Location: 753860
Implantable Lead Model: 3830
Implantable Lead Model: 5076
Implantable Pulse Generator Implant Date: 20240214
Lead Channel Impedance Value: 342 Ohm
Lead Channel Impedance Value: 437 Ohm
Lead Channel Impedance Value: 494 Ohm
Lead Channel Impedance Value: 627 Ohm
Lead Channel Pacing Threshold Amplitude: 0.5 V
Lead Channel Pacing Threshold Amplitude: 0.75 V
Lead Channel Pacing Threshold Pulse Width: 0.4 ms
Lead Channel Pacing Threshold Pulse Width: 0.4 ms
Lead Channel Sensing Intrinsic Amplitude: 2.625 mV
Lead Channel Sensing Intrinsic Amplitude: 2.875 mV
Lead Channel Sensing Intrinsic Amplitude: 4.125 mV
Lead Channel Sensing Intrinsic Amplitude: 4.25 mV
Lead Channel Setting Pacing Amplitude: 3.5 V
Lead Channel Setting Pacing Amplitude: 3.5 V
Lead Channel Setting Pacing Pulse Width: 0.4 ms
Lead Channel Setting Sensing Sensitivity: 1.2 mV
Zone Setting Status: 755011

## 2022-05-31 NOTE — Progress Notes (Signed)
Wound check appointment. Steri-strips removed. Wound without redness, signs of active infection or edema. There is 1 small open area to proximal end of incision line with stitch present.  [name] evaluated and removed stitch.  Wound cleansed with sterile normal saline and covered with 2/2 and tape.  Patient given wound care instructions: ok to shower and cleanse area with Dial soap, rinse and keep area clean and dry with recheck here in clinic in 1 week to ensure proper continue wound healing.  Open area was small and superficial, no need for re-application of steri strip. Also of note the loop explant wound area has closed but does present with some mild erythema and itching at site.  [name] made aware as well.  Same wound care instruction to follow as above.  Again, no sign of active infection.  Will also re-evaluate in 1 week.   Normal device function. Thresholds, sensing, and impedances consistent with implant measurements. Device programmed at 3.5V/auto capture programmed on for extra safety margin until 3 month visit. Histogram distribution appropriate for patient and level of activity. AT/AF [place] 15.1% with 4 episodes appearing all as AT/AF episodes with longest being 30 hours beginning on 2/15.  Patient has known PAF and is on [place].  No high ventricular rates noted. Patient educated about wound care, arm mobility, lifting restrictions. ROV in 3 months with implanting physician.

## 2022-06-04 ENCOUNTER — Encounter: Payer: Medicare Other | Admitting: Internal Medicine

## 2022-06-05 ENCOUNTER — Ambulatory Visit: Payer: Medicare Other

## 2022-06-06 ENCOUNTER — Ambulatory Visit: Payer: Medicare Other | Attending: Cardiovascular Disease

## 2022-06-06 DIAGNOSIS — Z950 Presence of cardiac pacemaker: Secondary | ICD-10-CM

## 2022-06-06 NOTE — Progress Notes (Signed)
Patient is in today for 1 week follow up on her device and ILR explant wound sites.  The device site has healed WNL, wound edges well approximated, no signs of infection.  The site where her loop was explanted presents with some dead tissue in wound bed and small open wound edges with redness where healing has ceased.  Wound bed cleansed with saline and able to gently remove dead tissue to promote continued wound healing.    Dr. Myles Gip evaluated wound area and felt area very superficial and will continue to heal at this point.  No signs of infection and no antibiotics needed.  Patient cleared to continue with showers and cleaning the area with Dial soap.  Okay to cover if draining or irritated by shirt but should leave open as much as possible.  Patient and daughter verbalize understanding.  They will monitor and call us if any concerns develop or wound does not continue to heal.

## 2022-06-06 NOTE — Patient Instructions (Addendum)
Your wound site has healed at the area of your pacemaker device. Your wound to the center of your lower chest continues to heal.  There is no sign of infection today; however, the area is still superficially open and you should continue to monitor the area for increased redness, drainage, fever, chills and contact our device clinic at [phone] if any concerns develop. You can shower.  Wash area with Dial soap using a clean cloth and towel with each wash.  If are does not continue to heal over the next couple of weeks, call our office for a wound recheck.

## 2022-06-19 ENCOUNTER — Other Ambulatory Visit: Payer: Medicare Other

## 2022-06-19 DIAGNOSIS — Z515 Encounter for palliative care: Secondary | ICD-10-CM

## 2022-06-19 NOTE — Progress Notes (Signed)
[facility] SW NOTE  PATIENT NAME: Penny Miller DOB: 1931/11/21 MRN: [id number]  PRIMARY CARE PROVIDER: [name], MD  RESPONSIBLE PARTY:  Acct ID - Guarantor Home Phone Work Phone Relationship Acct Type  [id number] - Symonette,[name]* [phone]  Self P/F     [address], [place], [place] [address]   Palliative Care Telephonic Encounter/Clinical Social Work  [employer] SW completed telephonic encounter with patient's son as he provided a brief patient status update from him. He stated that patient is having is weaker were she is having more difficulty getting out of bed. He stated he was hoping that palliative had a resource for in-home assistance with bathing. SW reminded him that hospice was discussed with them before. He advised that he still did not think patient was terminal, they just needed more help. SW provided a few resources for in-home care. SW also encouraged him to check with his insurance company regarding if her policy has assistance for in-home care. Patient's husband also served in the [employer], and SW encouraged him to check into [place] benefits that her mother might be entitled via her husband's benefits.  SW offered a nursing visit as patient appears to have declined. Her son agreed and asked SW to call back later to schedule a visit.    Social History   Tobacco Use   Smoking status: Former    Packs/day: 0.25    Years: 62.00    Additional pack years: 0.00    Total pack years: 15.50    Types: Cigarettes    Quit date: 04/02/2011    Years since quitting: 11.2   Smokeless tobacco: Never   Tobacco comments:    stopped consistently smoking in 2012 but still smokes occasionally (one pack per week)  Substance Use Topics   Alcohol use: No    CODE STATUS: Full Code ADVANCED DIRECTIVES: No MOST FORM COMPLETE:  No HOSPICE EDUCATION PROVIDED: No  Duration  of encounter and documentation: 30 minutes  [employer], LCSW

## 2022-06-24 ENCOUNTER — Other Ambulatory Visit: Payer: Medicare Other

## 2022-06-24 VITALS — BP 90/50 | HR 64 | Resp 22

## 2022-06-24 DIAGNOSIS — Z515 Encounter for palliative care: Secondary | ICD-10-CM

## 2022-07-02 ENCOUNTER — Other Ambulatory Visit: Payer: Self-pay | Admitting: Internal Medicine

## 2022-07-02 DIAGNOSIS — E785 Hyperlipidemia, unspecified: Secondary | ICD-10-CM

## 2022-07-02 DIAGNOSIS — E559 Vitamin D deficiency, unspecified: Secondary | ICD-10-CM

## 2022-07-02 DIAGNOSIS — I6782 Cerebral ischemia: Secondary | ICD-10-CM

## 2022-07-05 ENCOUNTER — Telehealth: Payer: Self-pay | Admitting: Internal Medicine

## 2022-07-05 NOTE — Telephone Encounter (Signed)
Patient's daughter in law states there has been some redness and streaking around the site of patient's pacemaker.

## 2022-07-05 NOTE — Telephone Encounter (Signed)
Picture received.  Reviewed by EP physician. Per review, site looks ok.  Continue to monitor and check back on Monday 07/08/2022. Family aware and in agreement.  Will continue to monitor.

## 2022-07-05 NOTE — Telephone Encounter (Signed)
Returned call to family.  Unable to to send pix via MyChart.  Attempting to have family send pix to this nurse phone for review.

## 2022-07-05 NOTE — Telephone Encounter (Signed)
Spoke with patient son [name] ( listed on DPR) stated his mother has red streaks around the pacemaker site. Pt son stated he will like take a picture of site and will to upload it to his mothers MyChart. Penny Miller if he as any difficulty with uploading the picture to call the clinic. He voiced understanding.

## 2022-07-08 ENCOUNTER — Other Ambulatory Visit: Payer: Self-pay | Admitting: Internal Medicine

## 2022-07-08 DIAGNOSIS — J301 Allergic rhinitis due to pollen: Secondary | ICD-10-CM

## 2022-07-08 DIAGNOSIS — L2084 Intrinsic (allergic) eczema: Secondary | ICD-10-CM

## 2022-07-08 NOTE — Telephone Encounter (Signed)
Patient is not available at this time. Will call back at later time.

## 2022-07-08 NOTE — Progress Notes (Unsigned)
[facility] Palliative Care Initial Encounter Note   PATIENT NAME: Penny Miller DOB: Jan 22, 1932 MRN: [id number]  PRIMARY CARE PROVIDER: Etta Grandchild, MD  RESPONSIBLE PARTY:  Acct ID - Guarantor Home Phone Work Phone Relationship Acct Type  [id number] - Mcleish,[name]* [phone]  Self P/F     [address] [place], [place] [address]   RN completed home visit. Son also present.    HISTORY OF PRESENT ILLNESS:   89 y.o.  female with history of persistent atrial fibrillation-not on rate control agents due to bradycardia-PPM being planned in the outpatient setting-presented to the hospital with acute hypoxic respiratory failure in the setting of COVID-19 infection and asthma exacerbation. Diagnosed with pneumonia on 04/04/22 and placed on [place].   Resp: Pt with increased respirations at beginning of visit from having "just walked into living room". Sats are in mid 90's. Reports occasional productive cough. Had reported last visit that she had stopped smoking this past December, however, son and pt both endorse that pt began smoking again a few days ago. Pt reports smoking 4 packs of cigarettes per week.  Cardiac: Heart sounds are regular. No JVD noted. Pt does have 1+ pedal edema noted to bilateral feet and ankles. Pt denies pain. Does not sit in recliner with feet elevated, but does report that she has been in bed more and more since pacemaker insertion in feb.  Mobility: Pt reports that she is using rollator full time now. Pt was observed using it to come into living room at onset of visit. Pt denies any falls since last visit and denies dizziness.  Appetite: Pt and son both report that appetite has decreased.   Pt and son also report that pt has "just not bounced back since pacemaker insertion." Reports that pt is staying in bed and sleeping a lot. Increased weakness, decreased appetite."  Requesting hospice evaluation.    RN did discuss benefits of hospice care as well as the differences  between the two with patient. RN to contact pcp regarding hospice eval.  Next appt: Dependent on hospice eval outcome.    PHYSICAL EXAM:   VITALS: Today's Vitals   06/24/22 1550  BP: (!) 90/50  Pulse: 64  Resp: (!) 22  SpO2: 95%    LUNGS: coarse breath sounds noted bilat. Non productive but "wet" sounding cough present. CARDIAC: Regular. No JVD EXTREMITIES: bilat ankles/feet swollen x 1+ SKIN:  WNL. Incision to upper left chest from pacemaker insertion healed, no redness, edema, or soreness.    Barbette Merino, RN

## 2022-07-09 NOTE — Telephone Encounter (Signed)
Left message to call back  

## 2022-07-10 ENCOUNTER — Telehealth: Payer: Self-pay | Admitting: Internal Medicine

## 2022-07-10 NOTE — Telephone Encounter (Signed)
Christy with [facility] calls today in regards to a request for advancement to hospice care for PT. When reading from the notes of PT's last visit, Neysa Bonito notes that PT has not bounced back from the insertion of their pace maker, PT still fatigued and experiencing loss of appetite. PT also experiencing increased swelling within the feet and ankles.   I had asked Neysa Bonito to fax the most recent notes for [place] to review.  When ready to give the okay (or if you have any questions) you can call back at  [facility]: [phone]

## 2022-07-11 NOTE — Telephone Encounter (Signed)
Spoke with christy with [facility] and per Dr. Yetta Barre okay with advancement to hospice care for pt

## 2022-07-11 NOTE — Telephone Encounter (Signed)
Yes, this is okay with me 

## 2022-07-11 NOTE — Telephone Encounter (Signed)
Spoke with Pt's son.  Per son device site looks well.  No further redness noted.

## 2022-07-16 ENCOUNTER — Other Ambulatory Visit: Payer: Self-pay | Admitting: Internal Medicine

## 2022-07-16 DIAGNOSIS — E1142 Type 2 diabetes mellitus with diabetic polyneuropathy: Secondary | ICD-10-CM

## 2022-07-16 DIAGNOSIS — M5416 Radiculopathy, lumbar region: Secondary | ICD-10-CM

## 2022-07-17 ENCOUNTER — Telehealth: Payer: Self-pay

## 2022-07-17 ENCOUNTER — Ambulatory Visit (INDEPENDENT_AMBULATORY_CARE_PROVIDER_SITE_OTHER)
Admission: RE | Admit: 2022-07-17 | Discharge: 2022-07-17 | Disposition: A | Discharge status: Home / Self Care | Payer: Medicare Other | Source: Ambulatory Visit | Attending: Internal Medicine | Admitting: Internal Medicine

## 2022-07-17 DIAGNOSIS — S22080A Wedge compression fracture of T11-T12 vertebra, initial encounter for closed fracture: Secondary | ICD-10-CM

## 2022-07-17 NOTE — Telephone Encounter (Signed)
Refill request for omeprazole 20 mg once daily from [facility].  Patient saw Dr. Adela Lank last Nov. 2023. He discontinued this medication:  "stop prilosec  in case related to B12 deficiency - switch to pepcid  once or twice daily, if this does not control symptoms she will let us know".  Called and left message for Patient's son, [name] (contact) requesting more information regarding the request.

## 2022-07-18 ENCOUNTER — Other Ambulatory Visit: Payer: Medicare Other

## 2022-08-14 ENCOUNTER — Ambulatory Visit (INDEPENDENT_AMBULATORY_CARE_PROVIDER_SITE_OTHER): Payer: Medicare Other | Admitting: Podiatry

## 2022-08-14 ENCOUNTER — Encounter: Payer: Self-pay | Admitting: Oncology

## 2022-08-14 ENCOUNTER — Encounter: Payer: Self-pay | Admitting: Podiatry

## 2022-08-14 DIAGNOSIS — M79675 Pain in left toe(s): Secondary | ICD-10-CM | POA: Diagnosis not present

## 2022-08-14 DIAGNOSIS — B351 Tinea unguium: Secondary | ICD-10-CM

## 2022-08-14 DIAGNOSIS — Q6671 Congenital pes cavus, right foot: Secondary | ICD-10-CM

## 2022-08-14 DIAGNOSIS — E1142 Type 2 diabetes mellitus with diabetic polyneuropathy: Secondary | ICD-10-CM | POA: Diagnosis not present

## 2022-08-14 DIAGNOSIS — M79674 Pain in right toe(s): Secondary | ICD-10-CM | POA: Diagnosis not present

## 2022-08-14 DIAGNOSIS — E119 Type 2 diabetes mellitus without complications: Secondary | ICD-10-CM

## 2022-08-14 DIAGNOSIS — Q6672 Congenital pes cavus, left foot: Secondary | ICD-10-CM | POA: Diagnosis not present

## 2022-08-15 ENCOUNTER — Other Ambulatory Visit: Payer: Self-pay | Admitting: Internal Medicine

## 2022-08-15 DIAGNOSIS — F325 Major depressive disorder, single episode, in full remission: Secondary | ICD-10-CM

## 2022-08-16 ENCOUNTER — Ambulatory Visit (INDEPENDENT_AMBULATORY_CARE_PROVIDER_SITE_OTHER): Payer: Medicare Other

## 2022-08-16 DIAGNOSIS — I495 Sick sinus syndrome: Secondary | ICD-10-CM | POA: Diagnosis not present

## 2022-08-16 LAB — CUP PACEART REMOTE DEVICE CHECK
Battery Remaining Longevity: 160 mo
Battery Voltage: 3.19 V
Brady Statistic AP VP Percent: 34.31 %
Brady Statistic AP VS Percent: 41.68 %
Brady Statistic AS VP Percent: 7.86 %
Brady Statistic AS VS Percent: 15.99 %
Brady Statistic RA Percent Paced: 36.71 %
Brady Statistic RV Percent Paced: 71.73 %
Date Time Interrogation Session: 20240517052233
Implantable Lead Connection Status: 753985
Implantable Lead Connection Status: 753985
Implantable Lead Implant Date: 20240214
Implantable Lead Implant Date: 20240214
Implantable Lead Location: 753859
Implantable Lead Location: 753860
Implantable Lead Model: 3830
Implantable Lead Model: 5076
Implantable Pulse Generator Implant Date: 20240214
Lead Channel Impedance Value: 304 Ohm
Lead Channel Impedance Value: 418 Ohm
Lead Channel Impedance Value: 551 Ohm
Lead Channel Impedance Value: 608 Ohm
Lead Channel Pacing Threshold Amplitude: 0.5 V
Lead Channel Pacing Threshold Amplitude: 0.75 V
Lead Channel Pacing Threshold Pulse Width: 0.4 ms
Lead Channel Pacing Threshold Pulse Width: 0.4 ms
Lead Channel Sensing Intrinsic Amplitude: 2.125 mV
Lead Channel Sensing Intrinsic Amplitude: 2.125 mV
Lead Channel Sensing Intrinsic Amplitude: 3.375 mV
Lead Channel Sensing Intrinsic Amplitude: 3.375 mV
Lead Channel Setting Pacing Amplitude: 1.5 V
Lead Channel Setting Pacing Amplitude: 2 V
Lead Channel Setting Pacing Pulse Width: 0.4 ms
Lead Channel Setting Sensing Sensitivity: 1.2 mV
Zone Setting Status: 755011

## 2022-08-17 NOTE — Progress Notes (Signed)
ANNUAL DIABETIC FOOT EXAM  Subjective: Penny Miller presents today annual diabetic foot exam.  Chief Complaint  Patient presents with   Nail Problem    DFC BS-did not check today A1C-do not know PCP-Thomas Jones PCP VST-6 months ago   Patient confirms h/o diabetes.  Patient denies any h/o foot wounds.  Patient has been diagnosed with neuropathy.  Risk factors: diabetes, diabetic neuropathy, h/o CVA, HTN, CKD, h/o tobacco use in remission.  Penny Grandchild, MD is patient's PCP.  Past Medical History:  Diagnosis Date   Allergic rhinitis, cause unspecified    Anxiety state, unspecified    Arthritis    Colon polyp    Diaphragmatic hernia without mention of obstruction or gangrene    Diverticulosis of colon (without mention of hemorrhage)    Female stress incontinence    HTN (hypertension)    Insomnia, unspecified    Interstitial cystitis    surgery 02/01/2013   Irritable bowel syndrome    Memory loss    Other and unspecified hyperlipidemia    Other premature beats    Polyneuropathy in diabetes(357.2)    Postmenopausal bleeding    Proteinuria    Rectal ulcer    Skin cancer    Stroke (HCC) 2018   Type II or unspecified type diabetes mellitus with neurological manifestations, not stated as uncontrolled(250.60)    Unspecified essential hypertension    Viral warts, unspecified    Patient Active Problem List   Diagnosis Date Noted   Pneumonia of both lungs due to infectious organism 04/05/2022   Subacute cough 04/04/2022   B12 deficiency 04/04/2022   Asthma, chronic, unspecified asthma severity, with acute exacerbation 03/12/2022   Anemia due to acquired thiamine deficiency 01/30/2022   Anemia due to zinc deficiency 01/30/2022   Thrombocytopenia (HCC) 01/23/2022   Sinus node dysfunction (HCC) 01/17/2022   PVC (premature ventricular contraction) 01/17/2022   Moderate persistent asthma without complication 09/25/2021   Constipation 07/25/2021   Pulmonary nodule  07/25/2021   Compression fracture of T12 vertebra (HCC) 07/23/2021   OAB (overactive bladder) 12/26/2020   Chronic cerebral ischemia 11/20/2020   Frequent falls 11/10/2020   Chronic renal disease, stage 4, severely decreased glomerular filtration rate (GFR) between 15-29 mL/min/1.73 square meter (HCC) 07/22/2020   Seasonal allergic rhinitis due to pollen 02/03/2020   Intrinsic eczema 02/03/2020   Incomplete emptying of bladder 10/01/2019   Acquired hypothyroidism 08/15/2019   Iron deficiency anemia due to chronic blood loss 08/15/2019   Spinal stenosis, lumbar region with neurogenic claudication 05/26/2018   Vertigo of central origin 07/21/2017   Overweight (BMI 25.0-29.9) 11/07/2016   Paroxysmal atrial fibrillation (HCC) 09/03/2016   Diabetic polyneuropathy associated with type 2 diabetes mellitus (HCC) 09/03/2016   Aneurysm of vertebral artery (HCC) 08/12/2016   History of CVA (cerebrovascular accident) 07/29/2016   DM (diabetes mellitus), type 2 with renal complications (HCC) 06/22/2014   Bladder infection, chronic 01/04/2013   Insomnia, unspecified    Essential hypertension    Chronic interstitial cystitis    Past Surgical History:  Procedure Laterality Date   ABDOMINAL HYSTERECTOMY  1977   Dr.Hambright   APPENDECTOMY  1965   CATARACT EXTRACTION, BILATERAL  02/10/2006   Dr.Groat    COLONOSCOPY  07/17/2004   polypectomy   CYSTOSCOPY  1987   Dr.Bradley   LOOP RECORDER INSERTION N/A 07/31/2016   Procedure: Loop Recorder Insertion;  Surgeon: Marinus Maw, MD;  Location: [facility] INVASIVE CV LAB;  Service: Cardiovascular;  Laterality: N/A;   LOOP  RECORDER REMOVAL N/A 05/15/2022   Procedure: LOOP RECORDER REMOVAL;  Surgeon: Marinus Maw, MD;  Location: [facility] INVASIVE CV LAB;  Service: Cardiovascular;  Laterality: N/A;   PACEMAKER IMPLANT N/A 05/15/2022   Procedure: PACEMAKER IMPLANT;  Surgeon: Marinus Maw, MD;  Location: [facility] INVASIVE CV LAB;  Service: Cardiovascular;  Laterality: N/A;    TEE WITHOUT CARDIOVERSION N/A 07/30/2016   Procedure: TRANSESOPHAGEAL ECHOCARDIOGRAM (TEE);  Surgeon: Wendall Stade, MD;  Location: [facility] ENDOSCOPY;  Service: Cardiovascular;  Laterality: N/A;   TRIGGER FINGER RELEASE  1990   Dr.Carter   TRIGGER FINGER RELEASE  05/13/16   urinary bladder      surgery 02/01/2013 to stretch bladder stem   Current Outpatient Medications on File Prior to Visit  Medication Sig Dispense Refill   ACCU-CHEK AVIVA PLUS test strip USE TO TEST BLOOD SUGAR TWICE DAILY. 100 each 11   acetaminophen (TYLENOL) 500 MG tablet Take 500 mg by mouth every 6 (six) hours as needed (pain).     amLODipine (NORVASC) 2.5 MG tablet Take 2.5 mg by mouth at bedtime.     apixaban (ELIQUIS) 2.5 MG TABS tablet Take 1 tablet (2.5 mg total) by mouth 2 (two) times daily. 180 tablet 0   atorvastatin (LIPITOR) 40 MG tablet TAKE 1 TABLET AT BEDTIME 90 tablet 0   budesonide (PULMICORT) 0.5 MG/2ML nebulizer solution USE 1 VIAL  IN  NEBULIZER TWICE  DAILY - - Rinse Mouth After Treatment (Patient taking differently: Take 0.5 mg by nebulization as needed (Wheezing and shorthness of breath).) 60 mL 5   dapagliflozin propanediol (FARXIGA) 10 MG TABS tablet Take 1 tablet (10 mg total) by mouth daily before breakfast. 90 tablet 1   famotidine (PEPCID) 20 MG tablet Take one tablet once to twice a day 60 tablet 0   ferrous sulfate 325 (65 FE) MG EC tablet Take 325 mg by mouth daily with breakfast. With Vitamin C     gabapentin (NEURONTIN) 300 MG capsule TAKE 1 CAPSULE THREE TIMES A DAY 270 capsule 3   Lancets (ACCU-CHEK MULTICLIX) lancets Use to test blood sugar twice daily. E11.29 100 each 11   methylcellulose (CITRUCEL) oral powder Take by mouth daily. 15 ml     NONFORMULARY OR COMPOUNDED ITEM Transport chair   #1    dx leg and hip pain s/p fall 1 each 0   NONFORMULARY OR COMPOUNDED ITEM Rollator walker with seat    #1   dx gait abnormality 1 each 0   polyethylene glycol powder (GLYCOLAX/MIRALAX) 17 GM/SCOOP  powder Take 17 g by mouth daily as needed for mild constipation. (Patient not taking: Reported on 05/13/2022) 238 g 0   thiamine (VITAMIN B-1) 50 MG tablet Take 1 tablet (50 mg total) by mouth daily. (Patient not taking: Reported on 05/13/2022) 90 tablet 1   triamcinolone cream (KENALOG) 0.5 % APPLY 1 APPLICATION TOPICALLY TWICE A DAY (Patient taking differently: Apply 1 Application topically daily as needed (Breaking out).) 450 g 3   Vitamin D, Ergocalciferol, (DRISDOL) 1.25 MG (50000 UNIT) CAPS capsule TAKE 1 CAPSULE EVERY 7 DAYS 12 capsule 0   zinc gluconate 50 MG tablet Take 1 tablet (50 mg total) by mouth daily. (Patient not taking: Reported on 05/13/2022) 90 tablet 1   No current facility-administered medications on file prior to visit.    Allergies  Allergen Reactions   Adhesive [Tape] Rash and Other (See Comments)    Tears the skin also   Ceclor [Cefaclor] Other (See Comments)  Unknown reaction; not recalled   Codeine Nausea And Vomiting   Jardiance [Empagliflozin] Diarrhea   Lopid [Gemfibrozil] Other (See Comments)    Unknown reaction; not recalled   Metformin And Related Diarrhea   Metoprolol Nausea And Vomiting   Morphine And Codeine Other (See Comments)    Passed out   Niacin And Related Other (See Comments)    Unknown reaction   Relafen [Nabumetone] Nausea And Vomiting   Septra [Sulfamethoxazole-Trimethoprim] Nausea And Vomiting   Tetracyclines & Related Nausea And Vomiting   Social History   Occupational History   Occupation: Runner, broadcasting/film/video retired    Comment: Engineer, agricultural   Tobacco Use   Smoking status: Former    Packs/day: 0.25    Years: 62.00    Additional pack years: 0.00    Total pack years: 15.50    Types: Cigarettes    Quit date: 04/02/2011    Years since quitting: 11.3   Smokeless tobacco: Never   Tobacco comments:    stopped consistently smoking in 2012 but still smokes occasionally (one pack per week)  Vaping Use   Vaping Use: Never used  Substance and  Sexual Activity   Alcohol use: No   Drug use: No   Sexual activity: Not Currently   Family History  Problem Relation Age of Onset   Pancreatic cancer Mother 52   Heart attack Father    Bladder Cancer Father    Hypertension Sister    Diabetes Sister    COPD Sister    Hypertension Son    Diabetes Brother    Heart disease Brother    Hypertension Brother    Diabetes Brother    Heart disease Brother 66       Myocardial Infarction    Heart attack Brother    Immunization History  Administered Date(s) Administered   Fluad Quad(high Dose 65+) 02/03/2020, 12/26/2020, 01/23/2022   Influenza, High Dose Seasonal PF 12/31/2016, 12/15/2017, 01/25/2019   Influenza,inj,Quad PF,6+ Mos 12/15/2012, 12/21/2013, 02/28/2015, 02/02/2016   Influenza-Unspecified 01/22/2012   PFIZER(Purple Top)SARS-COV-2 Vaccination 05/14/2019, 06/08/2019   Pneumococcal Conjugate-13 05/07/2016   Pneumococcal Polysaccharide-23 11/03/2019   Pneumococcal-Unspecified 01/24/1995   Td 09/14/2008   Tdap 01/25/2020     Review of Systems: Negative except as noted in the HPI.   Objective: There were no vitals filed for this visit.  BRADY ARMOND is a pleasant 89 y.o. female in NAD. AAO X 3.  Vascular Examination: CFT <3 seconds b/l. DP/PT pulses faintly palpable b/l. Skin temperature gradient warm to warm b/l. No pain with calf compression. No ischemia or gangrene. No cyanosis or clubbing noted b/l. Pedal hair absent.   Neurological Examination: Sensation grossly intact b/l with 10 gram monofilament. Vibratory sensation intact b/l. Pt has subjective symptoms of neuropathy.  Dermatological Examination: Pedal skin warm and supple b/l.   No open wounds. No interdigital macerations.  Toenails 1-5 b/l thick, discolored, elongated with subungual debris and pain on dorsal palpation.    No hyperkeratotic nor porokeratotic lesions present on today's visit.  Musculoskeletal Examination: Muscle strength 5/5 to b/l LE.  Pes cavus deformity noted b/l lower extremities.  Radiographs: None  Last A1c:      Latest Ref Rng & Units 01/23/2022   11:23 AM  Hemoglobin A1C  Hemoglobin-A1c 4.6 - 6.5 % 4.9    ADA Risk Categorization: Low Risk :  Patient has all of the following: Intact protective sensation No prior foot ulcer  No severe deformity Pedal pulses present  Assessment: 1. Pain due  to onychomycosis of toenails of both feet   2. Pes cavus of both feet   3. Diabetic peripheral neuropathy (HCC)   4. Encounter for diabetic foot exam (HCC)     Plan: -Consent given for treatment as described below: -Examined patient. -Diabetic foot examination performed today. -Continue diabetic foot care principles: inspect feet daily, monitor glucose as recommended by PCP and/or Endocrinologist, and follow prescribed diet per PCP, Endocrinologist and/or dietician. -Continue supportive shoe gear daily. -Toenails 1-5 b/l were debrided in length and girth with sterile nail nippers and dremel without iatrogenic bleeding.  -Patient/POA to call should there be question/concern in the interim. No follow-ups on file.  Freddie Breech, DPM

## 2022-08-19 ENCOUNTER — Ambulatory Visit (INDEPENDENT_AMBULATORY_CARE_PROVIDER_SITE_OTHER): Payer: Medicare Other | Admitting: Family Medicine

## 2022-08-19 ENCOUNTER — Encounter: Payer: Self-pay | Admitting: Family Medicine

## 2022-08-19 VITALS — BP 132/78 | HR 68 | Temp 97.5°F | Resp 20 | Ht 67.0 in | Wt 145.0 lb

## 2022-08-19 DIAGNOSIS — R195 Other fecal abnormalities: Secondary | ICD-10-CM | POA: Diagnosis not present

## 2022-08-19 DIAGNOSIS — N3001 Acute cystitis with hematuria: Secondary | ICD-10-CM | POA: Diagnosis not present

## 2022-08-19 DIAGNOSIS — J301 Allergic rhinitis due to pollen: Secondary | ICD-10-CM | POA: Diagnosis not present

## 2022-08-19 DIAGNOSIS — R300 Dysuria: Secondary | ICD-10-CM

## 2022-08-19 LAB — POCT URINALYSIS DIPSTICK
Bilirubin, UA: NEGATIVE
Blood, UA: 50
Glucose, UA: POSITIVE — AB
Ketones, UA: 5
Nitrite, UA: NEGATIVE
Protein, UA: POSITIVE — AB
Spec Grav, UA: 1.01 (ref 1.010–1.025)
Urobilinogen, UA: NEGATIVE E.U./dL — AB
pH, UA: 6 (ref 5.0–8.0)

## 2022-08-19 MED ORDER — LEVOCETIRIZINE DIHYDROCHLORIDE 5 MG PO TABS: 2.5000 mg | ORAL_TABLET | ORAL | 0 refills | Status: DC

## 2022-08-19 MED ORDER — CIPROFLOXACIN HCL 250 MG PO TABS: 250.0000 mg | ORAL_TABLET | Freq: Two times a day (BID) | ORAL | 0 refills | Status: AC

## 2022-08-19 NOTE — Progress Notes (Signed)
Assessment & Plan:  1. Acute cystitis with hematuria Education provided on UTIs. Encouraged adequate hydration.  - Urine Culture; Future - ciprofloxacin (CIPRO) 250 MG tablet; Take 1 tablet (250 mg total) by mouth 2 (two) times daily for 7 days.  Dispense: 14 tablet; Refill: 0 - Urine Culture  2. Dysuria Results for orders placed or performed in visit on 08/19/22  POCT urinalysis dipstick  Result Value Ref Range   Color, UA pale yellow    Clarity, UA cloudy    Glucose, UA Positive (A) Negative   Bilirubin, UA neg    Ketones, UA 5    Spec Grav, UA 1.010 1.010 - 1.025   Blood, UA 50    pH, UA 6.0 5.0 - 8.0   Protein, UA Positive (A) Negative   Urobilinogen, UA negative (A) 0.2 or 1.0 E.U./dL   Nitrite, UA neg    Leukocytes, UA Large (3+) (A) Negative   Appearance     Odor strong   - POCT urinalysis dipstick  3. Loose stools Advised to stop Metamucil.  4. Seasonal allergic rhinitis due to pollen Dose changed due to reduced kidney function. - levocetirizine (XYZAL) 5 MG tablet; Take 0.5 tablets (2.5 mg total) by mouth every other day.  Dispense: 45 tablet; Refill: 0   Follow up plan: Return in about 2 weeks (around 09/02/2022) for BM discussion with PCP.  Penny Boston, MSN, APRN, FNP-C  Subjective:  HPI: Penny Miller is a 89 y.o. female presenting on 08/19/2022 for Urinary Tract Infection (Cloudy, strong urine, Frequency, back pain and it started this morning ) and Diarrhea (Loose stools - about every time she goes to urinate, she has a loose BM also )  Patient is accompanied by her daughter-in-law, who she is okay with being present.  Patient complains of abnormal smelling urine, frequency, and cloudy urine . She has had symptoms for 1 week. Patient also complains of back pain. Patient denies fever. Patient does have a history of recurrent UTI.  Patient does not have a history of pyelonephritis.   Patient reports her bowel movements are not formed. She is leaking stool  with simple things such as bending over. She has been taking Metamucil.   She is also requesting a refill of Xyzal.      ROS: Negative unless specifically indicated above in HPI.   Relevant past medical history reviewed and updated as indicated.   Allergies and medications reviewed and updated.   Current Outpatient Medications:    ACCU-CHEK AVIVA PLUS test strip, USE TO TEST BLOOD SUGAR TWICE DAILY., Disp: 100 each, Rfl: 11   acetaminophen (TYLENOL) 500 MG tablet, Take 500 mg by mouth every 6 (six) hours as needed (pain)., Disp: , Rfl:    amLODipine (NORVASC) 2.5 MG tablet, Take 2.5 mg by mouth at bedtime., Disp: , Rfl:    apixaban (ELIQUIS) 2.5 MG TABS tablet, Take 1 tablet (2.5 mg total) by mouth 2 (two) times daily., Disp: 180 tablet, Rfl: 0   atorvastatin (LIPITOR) 40 MG tablet, TAKE 1 TABLET AT BEDTIME, Disp: 90 tablet, Rfl: 0   citalopram (CELEXA) 10 MG tablet, TAKE 1 TABLET DAILY, Disp: 90 tablet, Rfl: 1   dapagliflozin propanediol (FARXIGA) 10 MG TABS tablet, Take 1 tablet (10 mg total) by mouth daily before breakfast., Disp: 90 tablet, Rfl: 1   famotidine (PEPCID) 20 MG tablet, Take one tablet once to twice a day, Disp: 60 tablet, Rfl: 0   ferrous sulfate 325 (65 FE) MG EC  tablet, Take 325 mg by mouth daily with breakfast. With Vitamin C, Disp: , Rfl:    gabapentin (NEURONTIN) 300 MG capsule, TAKE 1 CAPSULE THREE TIMES A DAY, Disp: 270 capsule, Rfl: 3   Lancets (ACCU-CHEK MULTICLIX) lancets, Use to test blood sugar twice daily. E11.29, Disp: 100 each, Rfl: 11   Vitamin D, Ergocalciferol, (DRISDOL) 1.25 MG (50000 UNIT) CAPS capsule, TAKE 1 CAPSULE EVERY 7 DAYS, Disp: 12 capsule, Rfl: 0  Allergies  Allergen Reactions   Adhesive [Tape] Rash and Other (See Comments)    Tears the skin also   Ceclor [Cefaclor] Other (See Comments)    Unknown reaction; not recalled   Codeine Nausea And Vomiting   Jardiance [Empagliflozin] Diarrhea   Lopid [Gemfibrozil] Other (See Comments)     Unknown reaction; not recalled   Metformin And Related Diarrhea   Metoprolol Nausea And Vomiting   Morphine And Codeine Other (See Comments)    Passed out   Niacin And Related Other (See Comments)    Unknown reaction   Relafen [Nabumetone] Nausea And Vomiting   Septra [Sulfamethoxazole-Trimethoprim] Nausea And Vomiting   Tetracyclines & Related Nausea And Vomiting    Objective:   BP 132/78   Pulse 68   Temp (!) 97.5 F (36.4 C)   Resp 20   Ht 5\' 7"  (1.702 m)   Wt 145 lb (65.8 kg)   BMI 22.71 kg/m    Physical Exam Vitals reviewed.  Constitutional:      General: She is not in acute distress.    Appearance: Normal appearance. She is not ill-appearing, toxic-appearing or diaphoretic.  HENT:     Head: Normocephalic and atraumatic.  Eyes:     General: No scleral icterus.       Right eye: No discharge.        Left eye: No discharge.     Conjunctiva/sclera: Conjunctivae normal.  Cardiovascular:     Rate and Rhythm: Normal rate.  Pulmonary:     Effort: Pulmonary effort is normal. No respiratory distress.  Abdominal:     Tenderness: There is left CVA tenderness. There is no right CVA tenderness.  Musculoskeletal:        General: Normal range of motion.     Cervical back: Normal range of motion.  Skin:    General: Skin is warm and dry.     Capillary Refill: Capillary refill takes less than 2 seconds.  Neurological:     General: No focal deficit present.     Mental Status: She is alert and oriented to person, place, and time. Mental status is at baseline.  Psychiatric:        Mood and Affect: Mood normal.        Behavior: Behavior normal.        Thought Content: Thought content normal.        Judgment: Judgment normal.

## 2022-08-19 NOTE — Patient Instructions (Signed)
Stop Metamucil

## 2022-08-20 ENCOUNTER — Other Ambulatory Visit: Payer: Medicare Other

## 2022-08-22 ENCOUNTER — Ambulatory Visit: Payer: Medicare Other | Attending: Internal Medicine | Admitting: Internal Medicine

## 2022-08-22 ENCOUNTER — Encounter: Payer: Self-pay | Admitting: Internal Medicine

## 2022-08-22 VITALS — BP 144/90 | HR 71 | Ht 67.0 in | Wt 150.2 lb

## 2022-08-22 DIAGNOSIS — I495 Sick sinus syndrome: Secondary | ICD-10-CM | POA: Diagnosis not present

## 2022-08-22 DIAGNOSIS — I493 Ventricular premature depolarization: Secondary | ICD-10-CM | POA: Diagnosis not present

## 2022-08-22 DIAGNOSIS — Z950 Presence of cardiac pacemaker: Secondary | ICD-10-CM | POA: Insufficient documentation

## 2022-08-22 DIAGNOSIS — I480 Paroxysmal atrial fibrillation: Secondary | ICD-10-CM | POA: Insufficient documentation

## 2022-08-22 LAB — CUP PACEART INCLINIC DEVICE CHECK
Battery Remaining Longevity: 153 mo
Battery Voltage: 3.18 V
Brady Statistic AP VP Percent: 30.86 %
Brady Statistic AP VS Percent: 43.91 %
Brady Statistic AS VP Percent: 7.11 %
Brady Statistic AS VS Percent: 17.96 %
Brady Statistic RA Percent Paced: 39.78 %
Brady Statistic RV Percent Paced: 66.63 %
Date Time Interrogation Session: 20240523163517
Implantable Lead Connection Status: 753985
Implantable Lead Connection Status: 753985
Implantable Lead Implant Date: 20240214
Implantable Lead Implant Date: 20240214
Implantable Lead Location: 753859
Implantable Lead Location: 753860
Implantable Lead Model: 3830
Implantable Lead Model: 5076
Implantable Pulse Generator Implant Date: 20240214
Lead Channel Impedance Value: 323 Ohm
Lead Channel Impedance Value: 456 Ohm
Lead Channel Impedance Value: 589 Ohm
Lead Channel Impedance Value: 627 Ohm
Lead Channel Pacing Threshold Amplitude: 0.5 V
Lead Channel Pacing Threshold Amplitude: 0.75 V
Lead Channel Pacing Threshold Pulse Width: 0.4 ms
Lead Channel Pacing Threshold Pulse Width: 0.4 ms
Lead Channel Sensing Intrinsic Amplitude: 1.125 mV
Lead Channel Sensing Intrinsic Amplitude: 1.625 mV
Lead Channel Sensing Intrinsic Amplitude: 4.875 mV
Lead Channel Sensing Intrinsic Amplitude: 5.125 mV
Lead Channel Setting Pacing Amplitude: 1.5 V
Lead Channel Setting Pacing Amplitude: 2.5 V
Lead Channel Setting Pacing Pulse Width: 0.4 ms
Lead Channel Setting Sensing Sensitivity: 1.2 mV
Zone Setting Status: 755011

## 2022-08-22 LAB — URINE CULTURE

## 2022-08-22 NOTE — Patient Instructions (Addendum)
Medication Instructions:  Your physician recommends that you continue on your current medications as directed. Please refer to the Current Medication list given to you today.  *If you need a refill on your cardiac medications before your next appointment, please call your pharmacy*  Lab Work: None ordered.  If you have labs (blood work) drawn today and your tests are completely normal, you will receive your results only by: MyChart Message (if you have MyChart) OR A paper copy in the mail If you have any lab test that is abnormal or we need to change your treatment, we will call you to review the results.  Testing/Procedures: None ordered.  Follow-Up: At [facility], you and your health needs are our priority.  As part of our continuing mission to provide you with exceptional heart care, we have created designated Provider Care Teams.  These Care Teams include your primary Cardiologist (physician) and Advanced Practice Providers (APPs -  Physician Assistants and Nurse Practitioners) who all work together to provide you with the care you need, when you need it.   Your next appointment:   1 year(s)  The format for your next appointment:   In Person  Provider:   Lewayne Bunting, MD{or one of the following Advanced Practice Providers on your designated Care Team:   Francis Dowse, [place] Casimiro Needle "Mardelle Matte" Lanna Poche, [place]  Remote monitoring is used to monitor your Pacemaker from home. This monitoring reduces the number of office visits required to check your device to one time per year. It allows [place] to keep an eye on the functioning of your device to ensure it is working properly. You are scheduled for a device check from home on 8/16. You may send your transmission at any time that day. If you have a wireless device, the transmission will be sent automatically. After your physician reviews your transmission, you will receive a postcard with your next transmission date.

## 2022-08-22 NOTE — Progress Notes (Signed)
HPI Penny Miller returns today for followup. She has a h/o cryptogenic stroke and was found to have PAF. I saw her a couple of months ago and she had experienced unexplained bradycardia. She wore a cardiac zio monitor demonstrating daytime episodes of bradycardia when she is in atrial fib which she is about 5% of the time. She is s/p PPM insertion. She has done well in the interim.  Allergies  Allergen Reactions   Adhesive [Tape] Rash and Other (See Comments)    Tears the skin also   Ceclor [Cefaclor] Other (See Comments)    Unknown reaction; not recalled   Codeine Nausea And Vomiting   Jardiance [Empagliflozin] Diarrhea   Lopid [Gemfibrozil] Other (See Comments)    Unknown reaction; not recalled   Metformin And Related Diarrhea   Metoprolol Nausea And Vomiting   Morphine And Codeine Other (See Comments)    Passed out   Niacin And Related Other (See Comments)    Unknown reaction   Relafen [Nabumetone] Nausea And Vomiting   Septra [Sulfamethoxazole-Trimethoprim] Nausea And Vomiting   Tetracyclines & Related Nausea And Vomiting     Current Outpatient Medications  Medication Sig Dispense Refill   ACCU-CHEK AVIVA PLUS test strip USE TO TEST BLOOD SUGAR TWICE DAILY. 100 each 11   acetaminophen (TYLENOL) 500 MG tablet Take 500 mg by mouth every 6 (six) hours as needed (pain).     amLODipine (NORVASC) 2.5 MG tablet Take 2.5 mg by mouth at bedtime.     apixaban (ELIQUIS) 2.5 MG TABS tablet Take 1 tablet (2.5 mg total) by mouth 2 (two) times daily. 180 tablet 0   atorvastatin (LIPITOR) 40 MG tablet TAKE 1 TABLET AT BEDTIME 90 tablet 0   ciprofloxacin (CIPRO) 250 MG tablet Take 1 tablet (250 mg total) by mouth 2 (two) times daily for 7 days. 14 tablet 0   citalopram (CELEXA) 10 MG tablet TAKE 1 TABLET DAILY 90 tablet 1   dapagliflozin propanediol (FARXIGA) 10 MG TABS tablet Take 1 tablet (10 mg total) by mouth daily before breakfast. 90 tablet 1   famotidine (PEPCID) 20 MG tablet Take  one tablet once to twice a day 60 tablet 0   ferrous sulfate 325 (65 FE) MG EC tablet Take 325 mg by mouth daily with breakfast. With Vitamin C     gabapentin (NEURONTIN) 300 MG capsule TAKE 1 CAPSULE THREE TIMES A DAY 270 capsule 3   Lancets (ACCU-CHEK MULTICLIX) lancets Use to test blood sugar twice daily. E11.29 100 each 11   levocetirizine (XYZAL) 5 MG tablet Take 0.5 tablets (2.5 mg total) by mouth every other day. 45 tablet 0   Vitamin D, Ergocalciferol, (DRISDOL) 1.25 MG (50000 UNIT) CAPS capsule TAKE 1 CAPSULE EVERY 7 DAYS 12 capsule 0   No current facility-administered medications for this visit.     Past Medical History:  Diagnosis Date   Allergic rhinitis, cause unspecified    Anxiety state, unspecified    Arthritis    Colon polyp    Diaphragmatic hernia without mention of obstruction or gangrene    Diverticulosis of colon (without mention of hemorrhage)    Female stress incontinence    HTN (hypertension)    Insomnia, unspecified    Interstitial cystitis    surgery 02/01/2013   Irritable bowel syndrome    Memory loss    Other and unspecified hyperlipidemia    Other premature beats    Polyneuropathy in diabetes(357.2)    Postmenopausal bleeding  Proteinuria    Rectal ulcer    Skin cancer    Stroke (HCC) 2018   Type II or unspecified type diabetes mellitus with neurological manifestations, not stated as uncontrolled(250.60)    Unspecified essential hypertension    Viral warts, unspecified     ROS:   All systems reviewed and negative except as noted in the HPI.   Past Surgical History:  Procedure Laterality Date   ABDOMINAL HYSTERECTOMY  1977   Dr.Hambright   APPENDECTOMY  1965   CATARACT EXTRACTION, BILATERAL  02/10/2006   Dr.Groat    COLONOSCOPY  07/17/2004   polypectomy   CYSTOSCOPY  1987   Dr.Bradley   LOOP RECORDER INSERTION N/A 07/31/2016   Procedure: Loop Recorder Insertion;  Surgeon: Marinus Maw, MD;  Location: [facility];  Service:  Cardiovascular;  Laterality: N/A;   LOOP RECORDER REMOVAL N/A 05/15/2022   Procedure: LOOP RECORDER REMOVAL;  Surgeon: Marinus Maw, MD;  Location: [facility];  Service: Cardiovascular;  Laterality: N/A;   PACEMAKER IMPLANT N/A 05/15/2022   Procedure: PACEMAKER IMPLANT;  Surgeon: Marinus Maw, MD;  Location: [facility];  Service: Cardiovascular;  Laterality: N/A;   TEE WITHOUT CARDIOVERSION N/A 07/30/2016   Procedure: TRANSESOPHAGEAL ECHOCARDIOGRAM (TEE);  Surgeon: Wendall Stade, MD;  Location: [facility];  Service: Cardiovascular;  Laterality: N/A;   TRIGGER FINGER RELEASE  1990   Dr.Carter   TRIGGER FINGER RELEASE  05/13/16   urinary bladder      surgery 02/01/2013 to stretch bladder stem     Family History  Problem Relation Age of Onset   Pancreatic cancer Mother 32   Heart attack Father    Bladder Cancer Father    Hypertension Sister    Diabetes Sister    COPD Sister    Hypertension Son    Diabetes Brother    Heart disease Brother    Hypertension Brother    Diabetes Brother    Heart disease Brother 33       Myocardial Infarction    Heart attack Brother      Social History   Socioeconomic History   Marital status: Widowed    Spouse name: Not on file   Number of children: 1   Years of education: Not on file   Highest education level: Not on file  Occupational History   Occupation: Teacher retired    Comment: Engineer, agricultural   Tobacco Use   Smoking status: Former    Packs/day: 0.25    Years: 62.00    Additional pack years: 0.00    Total pack years: 15.50    Types: Cigarettes    Quit date: 04/02/2011    Years since quitting: 11.3   Smokeless tobacco: Never   Tobacco comments:    stopped consistently smoking in 2012 but still smokes occasionally (one pack per week)  Vaping Use   Vaping Use: Never used  Substance and Sexual Activity   Alcohol use: No   Drug use: No   Sexual activity: Not Currently  Other Topics Concern   Not on file   Social History Narrative   Ophthalmologist-Dr.Gould and Dr.Groat   Podiatrist- Dr.Tuchman   Dermatologist- Dr.Lupton   Urologist- Dr. Darvin Neighbours    Lives with her son.    Social Determinants of Health   Financial Resource Strain: Low Risk  (08/30/2020)   Overall Financial Resource Strain (CARDIA)    Difficulty of Paying Living Expenses: Not hard at all  Food  Insecurity: No Food Insecurity (08/30/2020)   Hunger Vital Sign    Worried About Running Out of Food in the Last Year: Never true    Ran Out of Food in the Last Year: Never true  Transportation Needs: No Transportation Needs (08/30/2020)   PRAPARE - Administrator, Civil Service (Medical): No    Lack of Transportation (Non-Medical): No  Physical Activity: Inactive (08/30/2020)   Exercise Vital Sign    Days of Exercise per Week: 0 days    Minutes of Exercise per Session: 0 min  Stress: No Stress Concern Present (08/30/2020)   [employer] of Occupational Health - Occupational Stress Questionnaire    Feeling of Stress : Not at all  Social Connections: Moderately Integrated (08/30/2020)   Social Connection and Isolation Panel [NHANES]    Frequency of Communication with Friends and Family: More than three times a week    Frequency of Social Gatherings with Friends and Family: Three times a week    Attends Religious Services: More than 4 times per year    Active Member of Clubs or Organizations: No    Attends Banker Meetings: More than 4 times per year    Marital Status: Widowed  Intimate Partner Violence: Not At Risk (08/30/2020)   Humiliation, Afraid, Rape, and Kick questionnaire    Fear of Current or Ex-Partner: No    Emotionally Abused: No    Physically Abused: No    Sexually Abused: No     BP (!) 144/90   Pulse 71   Ht 5\' 7"  (1.702 m)   Wt 150 lb 3.2 oz (68.1 kg)   SpO2 98%   BMI 23.52 kg/m   Physical Exam:  Well appearing NAD HEENT: Unremarkable Neck:  No JVD, no thyromegally Lymphatics:   No adenopathy Back:  No CVA tenderness Lungs:  Clear with no wheezes HEART:  Regular rate rhythm, no murmurs, no rubs, no clicks Abd:  soft, positive bowel sounds, no organomegally, no rebound, no guarding Ext:  2 plus pulses, no edema, no cyanosis, no clubbing Skin:  No rashes no nodules Neuro:  CN II through XII intact, motor grossly intact  EKG - atrial fib with ventricular pacing  DEVICE  Normal device function.  See PaceArt for details.   Assess/Plan:  PAF - she is now minimally symptomatic. No change in meds for now. Symptomatic bradycardia - see above. Interestingly this is occurring when she is in atrial fib.  Chronic anti-coagulation - she will continue her blood thinners. Dyspnea - this is much improved.  Sharlot Gowda Arhaan Chesnut,MD

## 2022-08-28 NOTE — Progress Notes (Signed)
Remote pacemaker transmission.   

## 2022-09-02 ENCOUNTER — Other Ambulatory Visit: Payer: Medicare Other

## 2022-09-02 VITALS — BP 130/82 | HR 61 | Temp 98.5°F

## 2022-09-02 DIAGNOSIS — Z515 Encounter for palliative care: Secondary | ICD-10-CM

## 2022-09-02 NOTE — Progress Notes (Unsigned)
PATIENT NAME: Penny Miller DOB: 10-10-1931 MRN: [id number]  PRIMARY CARE PROVIDER: Etta Grandchild, MD  RESPONSIBLE PARTY:  Acct ID - Guarantor Home Phone Work Phone Relationship Acct Type  [id number] - Penny Miller,[name]* [phone]  Self P/F     [address] [place], [place] [address]   Palliative Care Follow Up Encounter Note   Completed home visit. [name] also present.     HISTORY OF PRESENT ILLNESS:  89 y.o.  female with history of persistent atrial fibrillation-not on rate control agents due to bradycardia-PPM being planned in the outpatient setting-presented to the hospital with acute hypoxic respiratory failure in the setting of COVID-19 infection and asthma exacerbation. Seasonal allergic rhinitis  TODAY'S VISIT: pt reports that she has a runny nose on occasion  Respiratory: no SOB during this visit; has SOB with exertion  Cardiac: no edema  Cognitive: alert and oriented   Appetite: eats 2 reduced meals; snacks throughout the day and at night to take meds; drinks less than 6 glasses per day; encouraged pt to drink another 12oz bottle of water daily  GI/GU: has a BM at least every 2 days and sometimes more; no urinary issues   Mobility: independent since getting her pacemaker   ADLs: independent with bathing, dressing, grooming, recently able to mop her kitchen floor again   Sleeping Pattern: goes to the bathroom at night and goes back to sleep most of the time  Pain: denies pain at this time  Wt: pt reports she is staying between 145 to 150 lbs  Diabetes: does finger sticks; as per pt last A1C was 6    Goals of Care: To stay in the home with her son and DIL    Next Appt Scheduled For: 10/15/22 @ 3pm     CODE STATUS: Full Code ADVANCED DIRECTIVES: N MOST FORM: No PPS: 70%   PHYSICAL EXAM:   VITALS: Today's Vitals   09/02/22 1213  BP: 130/82  Pulse: 61  Temp: 98.5 F (36.9 C)  TempSrc: Temporal  SpO2: 93%  PainSc: 0-No pain    LUNGS: clear  to auscultation , decreased breath sounds CARDIAC: Cor RRR EXTREMITIES: AROM x4; no edema SKIN: Skin color, texture, turgor normal. No rashes or lesions  NEURO: negative except for weakness       Zeplin Aleshire Clementeen Graham, LPN

## 2022-09-05 ENCOUNTER — Encounter: Payer: Self-pay | Admitting: Internal Medicine

## 2022-09-05 ENCOUNTER — Ambulatory Visit (INDEPENDENT_AMBULATORY_CARE_PROVIDER_SITE_OTHER): Payer: Medicare Other

## 2022-09-05 ENCOUNTER — Ambulatory Visit (INDEPENDENT_AMBULATORY_CARE_PROVIDER_SITE_OTHER): Payer: Medicare Other | Admitting: Internal Medicine

## 2022-09-05 VITALS — BP 136/82 | HR 76 | Temp 97.9°F | Resp 16 | Ht 67.0 in | Wt 151.0 lb

## 2022-09-05 DIAGNOSIS — N184 Chronic kidney disease, stage 4 (severe): Secondary | ICD-10-CM | POA: Diagnosis not present

## 2022-09-05 DIAGNOSIS — K5900 Constipation, unspecified: Secondary | ICD-10-CM

## 2022-09-05 DIAGNOSIS — R10817 Generalized abdominal tenderness: Secondary | ICD-10-CM | POA: Insufficient documentation

## 2022-09-05 DIAGNOSIS — K5904 Chronic idiopathic constipation: Secondary | ICD-10-CM | POA: Diagnosis not present

## 2022-09-05 LAB — TSH: TSH: 3.25 u[IU]/mL (ref 0.35–5.50)

## 2022-09-05 LAB — BASIC METABOLIC PANEL
BUN: 22 mg/dL (ref 6–23)
CO2: 28 mEq/L (ref 19–32)
Calcium: 9.6 mg/dL (ref 8.4–10.5)
Chloride: 104 mEq/L (ref 96–112)
Creatinine, Ser: 1.29 mg/dL — ABNORMAL HIGH (ref 0.40–1.20)
GFR: 36.94 mL/min — ABNORMAL LOW (ref >60.00)
Glucose, Bld: 78 mg/dL (ref 70–99)
Potassium: 4 mEq/L (ref 3.5–5.1)
Sodium: 141 mEq/L (ref 135–145)

## 2022-09-05 LAB — MAGNESIUM: Magnesium: 2 mg/dL (ref 1.5–2.5)

## 2022-09-05 MED ORDER — TRULANCE 3 MG PO TABS: 1.0000 | ORAL_TABLET | Freq: Every day | ORAL | 1 refills | Status: DC

## 2022-09-05 NOTE — Progress Notes (Signed)
Subjective:  Patient ID: Penny Miller, female    DOB: November 09, 1931  Age: 89 y.o. MRN: [id number]  CC: Abdominal Pain   HPI Penny Miller presents for f/up ---  She complains that her last normal bowel movement was 2 weeks ago and she has abdominal pain.  Her appetite is good and she denies nausea, vomiting, diarrhea, or bright red blood per rectum.  Outpatient Medications Prior to Visit  Medication Sig Dispense Refill   ACCU-CHEK AVIVA PLUS test strip USE TO TEST BLOOD SUGAR TWICE DAILY. 100 each 11   acetaminophen (TYLENOL) 500 MG tablet Take 500 mg by mouth every 6 (six) hours as needed (pain).     amLODipine (NORVASC) 2.5 MG tablet Take 2.5 mg by mouth at bedtime.     apixaban (ELIQUIS) 2.5 MG TABS tablet Take 1 tablet (2.5 mg total) by mouth 2 (two) times daily. 180 tablet 0   atorvastatin (LIPITOR) 40 MG tablet TAKE 1 TABLET AT BEDTIME 90 tablet 0   citalopram (CELEXA) 10 MG tablet TAKE 1 TABLET DAILY 90 tablet 1   dapagliflozin propanediol (FARXIGA) 10 MG TABS tablet Take 1 tablet (10 mg total) by mouth daily before breakfast. 90 tablet 1   famotidine (PEPCID) 20 MG tablet Take one tablet once to twice a day 60 tablet 0   ferrous sulfate 325 (65 FE) MG EC tablet Take 325 mg by mouth daily with breakfast. With Vitamin C     gabapentin (NEURONTIN) 300 MG capsule TAKE 1 CAPSULE THREE TIMES A DAY 270 capsule 3   Lancets (ACCU-CHEK MULTICLIX) lancets Use to test blood sugar twice daily. E11.29 100 each 11   levocetirizine (XYZAL) 5 MG tablet Take 0.5 tablets (2.5 mg total) by mouth every other day. 45 tablet 0   Vitamin D, Ergocalciferol, (DRISDOL) 1.25 MG (50000 UNIT) CAPS capsule TAKE 1 CAPSULE EVERY 7 DAYS 12 capsule 0   No facility-administered medications prior to visit.    ROS Review of Systems  Constitutional:  Negative for appetite change, diaphoresis, fatigue and unexpected weight change.  Eyes: Negative.   Respiratory: Negative.  Negative for cough, chest tightness,  shortness of breath and wheezing.   Cardiovascular:  Negative for chest pain, palpitations and leg swelling.  Gastrointestinal:  Positive for abdominal pain and constipation. Negative for blood in stool, diarrhea, nausea, rectal pain and vomiting.  Genitourinary:  Negative for difficulty urinating.  Musculoskeletal: Negative.   Neurological: Negative.  Negative for dizziness and weakness.  Hematological:  Negative for adenopathy. Does not bruise/bleed easily.  Psychiatric/Behavioral:  The patient is not hyperactive.     Objective:  BP 136/82 (BP Location: Right Arm, Patient Position: Sitting, Cuff Size: Normal)   Pulse 76   Temp 97.9 F (36.6 C) (Oral)   Resp 16   Ht 5\' 7"  (1.702 m)   Wt 151 lb (68.5 kg)   SpO2 93%   BMI 23.65 kg/m   BP Readings from Last 3 Encounters:  09/05/22 136/82  09/02/22 130/82  08/22/22 (!) 144/90    Wt Readings from Last 3 Encounters:  09/05/22 151 lb (68.5 kg)  08/22/22 150 lb 3.2 oz (68.1 kg)  08/19/22 145 lb (65.8 kg)    Physical Exam Vitals reviewed. Exam conducted with a chaperone present.  Constitutional:      Appearance: She is well-developed.  HENT:     Nose: Nose normal.     Mouth/Throat:     Mouth: Mucous membranes are moist.  Eyes:  General: No scleral icterus.    Conjunctiva/sclera: Conjunctivae normal.  Cardiovascular:     Rate and Rhythm: Normal rate and regular rhythm.     Heart sounds: No murmur heard. Pulmonary:     Effort: Pulmonary effort is normal.     Breath sounds: No stridor. No wheezing, rhonchi or rales.  Abdominal:     General: Abdomen is flat. A surgical scar is present. Bowel sounds are normal. There is no distension.     Palpations: Abdomen is soft. There is no mass.     Tenderness: There is abdominal tenderness in the right upper quadrant, right lower quadrant and suprapubic area. There is no guarding or rebound.  Genitourinary:    Rectum: Normal. Guaiac result negative. No mass, tenderness, anal  fissure, external hemorrhoid or internal hemorrhoid. Normal anal tone.     Comments: Not impacted. Musculoskeletal:        General: Normal range of motion.     Cervical back: Neck supple.     Right lower leg: No edema.     Left lower leg: No edema.  Lymphadenopathy:     Cervical: No cervical adenopathy.  Skin:    General: Skin is warm and dry.  Neurological:     General: No focal deficit present.     Mental Status: She is alert. Mental status is at baseline.  Psychiatric:        Mood and Affect: Mood normal.        Behavior: Behavior normal.     Lab Results  Component Value Date   WBC 7.2 04/15/2022   HGB 11.4 04/15/2022   HCT 35.8 04/15/2022   PLT 186 04/15/2022   GLUCOSE 78 09/05/2022   CHOL 166 05/28/2021   TRIG 192.0 (H) 05/28/2021   HDL 44.10 05/28/2021   LDLCALC 84 05/28/2021   ALT 13 03/19/2022   AST 16 03/19/2022   NA 141 09/05/2022   K 4.0 09/05/2022   CL 104 09/05/2022   CREATININE 1.29 (H) 09/05/2022   BUN 22 09/05/2022   CO2 28 09/05/2022   TSH 3.25 09/05/2022   INR 1.2 07/16/2021   HGBA1C 4.9 01/23/2022   MICROALBUR 9.7 (H) 05/28/2021    DG Bone Density  Result Date: 07/19/2022 Table formatting from the original result was not included. Date of study: 07/17/2022 Exam: DUAL X-RAY ABSORPTIOMETRY (DXA) FOR BONE MINERAL DENSITY (BMD) Instrument: [employer] Requesting Provider: PCP Indication: Screening for osteoporosis Comparison: none (please note that it is not possible to compare data from different instruments) Clinical data: Pt is a 89 y.o. female with history of vertebral compression fracture. On calcium and vitamin D. Results:  Lumbar spine L1-L4 (L3) Femoral neck (FN) 33% distal radius T-score -1.2 RFN: -1.2 LFN: -1.2 n/a Assessment: the BMD is low according to the [facility] classification for osteoporosis (see below).  Patient has clinical osteoporosis, due to the history of vertebral compression fracture (see below). Fracture risk: high FRAX score:  10 year major osteoporotic risk: 20.1%. 10 year hip fracture risk: 6.6%. The thresholds for treatment are 20% and 3%, respectively. Comments: the technical quality of the study is good, however, the spine is scoliotic and arthritic. Calcium accumulation in arthritic sites can confound the results of the bone density scan. Also, L3 vertebra had to be excluded from analysis due to degenerative changes/possible compression fracture. Evaluation for secondary causes should be considered if clinically indicated. Recommend optimizing calcium (1200 mg/day) and vitamin D (800 IU/day) intake. Followup: Repeat BMD is appropriate after  2 years or after 1-2 years if starting treatment. WHO criteria for diagnosis of osteoporosis in postmenopausal women and in men 17 y/o or older: - normal: T-score -1.0 to + 1.0 - osteopenia/low bone density: T-score between -2.5 and -1.0 - osteoporosis: T-score below -2.5 - severe osteoporosis: T-score below -2.5 with history of fragility fracture Note: although not part of the WHO classification, the presence of a fragility fracture, regardless of the T-score, should be considered diagnostic of osteoporosis, provided other causes for the fracture have been excluded. Treatment: The National Osteoporosis Foundation recommends that treatment be considered in postmenopausal women and men age 43 or older with: 1. Hip or vertebral (clinical or morphometric) fracture 2. T-score of - 2.5 or lower at the spine or hip 3. 10-year fracture probability by FRAX of at least 20% for a major osteoporotic fracture and 3% for a hip fracture Carlus Pavlov, MD [facility]    DG ABD ACUTE 2+V W 1V CHEST  Result Date: 09/05/2022 CLINICAL DATA:  Lower quadrant abdominal pain and constipation EXAM: DG ABDOMEN ACUTE WITH 1 VIEW CHEST COMPARISON:  05/15/2022 FINDINGS: Nonobstructive pattern of bowel gas with gas present to the rectum. Moderate burden of stool throughout the left and right colon. No free  air in the abdomen. No radiopaque calculi or other significant radiographic abnormality is seen. Cardiomegaly. Left chest multi lead pacer. Both lungs are clear. IMPRESSION: 1. Nonobstructive pattern of bowel gas with gas present to the rectum. Moderate burden of stool throughout the left and right colon. No free air in the abdomen. 2.  Cardiomegaly without acute abnormality of the lungs. Electronically Signed   By: Jearld Lesch M.D.   On: 09/05/2022 14:44     Assessment & Plan:   Generalized abdominal tenderness without rebound tenderness- Symptoms, exam, and x-rays are not consistent with acute abdominal process.  Labs are negative for causes of constipation.  Will treat with Trulance. -     DG ABD ACUTE 2+V W 1V CHEST; Future  Constipation, unspecified constipation type -     Magnesium; Future -     Basic metabolic panel; Future -     TSH; Future  Chronic renal disease, stage 4, severely decreased glomerular filtration rate (GFR) between 15-29 mL/min/1.73 square meter (HCC)  Chronic idiopathic constipation -     Trulance; Take 1 tablet (3 mg total) by mouth daily.  Dispense: 90 tablet; Refill: 1     Follow-up: Return in about 3 months (around 12/06/2022).  Sanda Linger, MD

## 2022-09-05 NOTE — Patient Instructions (Signed)

## 2022-09-06 ENCOUNTER — Encounter: Payer: Self-pay | Admitting: Oncology

## 2022-09-06 ENCOUNTER — Encounter: Payer: Self-pay | Admitting: Internal Medicine

## 2022-09-12 ENCOUNTER — Telehealth: Payer: Medicare Other | Admitting: Internal Medicine

## 2022-09-12 NOTE — Telephone Encounter (Signed)
Pt son called stating that when the pt take the trulance it gives her diarrhea. Pt wants to know do she need to stop taking it or maybe she need something with a lower dosage. Pt stated this has been going on for a few days. Please advise.

## 2022-09-12 NOTE — Telephone Encounter (Signed)
Called back and still needs to know.

## 2022-09-13 NOTE — Telephone Encounter (Signed)
Patient's daughter-in-law Emercyn Parfitt called and asked if something could be recommended, either OTC or prescription, to help with patient's constipation. She said they just want something milder than the Trulance. Corrie Dandy would like a call back at [phone].

## 2022-09-13 NOTE — Telephone Encounter (Signed)
Called pt's son, [name], LVM instructing they to d/c Trulance per PCP.

## 2022-09-13 NOTE — Telephone Encounter (Signed)
Pt's daughter in law, Corrie Dandy, has been informed to try OTC Miralax.

## 2022-09-19 ENCOUNTER — Other Ambulatory Visit (HOSPITAL_COMMUNITY): Payer: Self-pay

## 2022-09-20 ENCOUNTER — Telehealth: Payer: Self-pay | Admitting: Internal Medicine

## 2022-09-20 NOTE — Telephone Encounter (Signed)
Patient wants to know if thre is anything besides Trulance that patient could be given to help her go to the bathroom - Trulance is causing patient to have diarrhea.  Please call paitent:  [phone]

## 2022-09-24 ENCOUNTER — Other Ambulatory Visit: Payer: Self-pay | Admitting: Internal Medicine

## 2022-09-24 DIAGNOSIS — E559 Vitamin D deficiency, unspecified: Secondary | ICD-10-CM

## 2022-09-30 ENCOUNTER — Other Ambulatory Visit: Payer: Self-pay | Admitting: Internal Medicine

## 2022-09-30 DIAGNOSIS — I6782 Cerebral ischemia: Secondary | ICD-10-CM

## 2022-09-30 DIAGNOSIS — E785 Hyperlipidemia, unspecified: Secondary | ICD-10-CM

## 2022-10-15 ENCOUNTER — Other Ambulatory Visit: Payer: Medicare Other

## 2022-10-15 ENCOUNTER — Other Ambulatory Visit (HOSPITAL_COMMUNITY): Payer: Self-pay | Admitting: Internal Medicine

## 2022-10-15 DIAGNOSIS — I480 Paroxysmal atrial fibrillation: Secondary | ICD-10-CM

## 2022-10-16 NOTE — Telephone Encounter (Signed)
Per Margaretmary Dys, PharmD, pt should remain on 2.5mg  BID at this time. Refill sent.

## 2022-10-16 NOTE — Telephone Encounter (Signed)
SCr has fluctuated quite a bit, most recent SCr < 1.5 but 2 prior SCr were > 1.5, ok to fill as pt has been taking as 2.5mg  BID dose. Can reevaluate kidney function again at next lab check for future refills.

## 2022-10-16 NOTE — Telephone Encounter (Signed)
Eliquis 2.5mg  refill request received. Patient is 89 years old, weight-68.5kg, Crea-1.29 on 09/05/22, Diagnosis-Afib & cryptogenic stroke, and last seen by Dr. Ladona Ridgel on 08/22/22.   Creatinine levels have been >1.5 and <1.5; will have Pharm D evaluate doseage.

## 2022-10-28 ENCOUNTER — Other Ambulatory Visit: Payer: Self-pay | Admitting: Internal Medicine

## 2022-10-30 ENCOUNTER — Other Ambulatory Visit: Payer: Self-pay | Admitting: Family Medicine

## 2022-10-30 DIAGNOSIS — J301 Allergic rhinitis due to pollen: Secondary | ICD-10-CM

## 2022-11-15 ENCOUNTER — Ambulatory Visit (INDEPENDENT_AMBULATORY_CARE_PROVIDER_SITE_OTHER): Payer: Medicare Other

## 2022-11-15 DIAGNOSIS — I495 Sick sinus syndrome: Secondary | ICD-10-CM | POA: Diagnosis not present

## 2022-11-15 LAB — CUP PACEART REMOTE DEVICE CHECK
Battery Remaining Longevity: 148 mo
Battery Voltage: 3.15 V
Brady Statistic AP VP Percent: 15.59 %
Brady Statistic AP VS Percent: 68.91 %
Brady Statistic AS VP Percent: 2.63 %
Brady Statistic AS VS Percent: 12.77 %
Brady Statistic RA Percent Paced: 62.65 %
Brady Statistic RV Percent Paced: 39.13 %
Date Time Interrogation Session: 20240816053204
Implantable Lead Connection Status: 753985
Implantable Lead Connection Status: 753985
Implantable Lead Implant Date: 20240214
Implantable Lead Implant Date: 20240214
Implantable Lead Location: 753859
Implantable Lead Location: 753860
Implantable Lead Model: 3830
Implantable Lead Model: 5076
Implantable Pulse Generator Implant Date: 20240214
Lead Channel Impedance Value: 285 Ohm
Lead Channel Impedance Value: 399 Ohm
Lead Channel Impedance Value: 456 Ohm
Lead Channel Impedance Value: 551 Ohm
Lead Channel Pacing Threshold Amplitude: 0.5 V
Lead Channel Pacing Threshold Amplitude: 0.875 V
Lead Channel Pacing Threshold Pulse Width: 0.4 ms
Lead Channel Pacing Threshold Pulse Width: 0.4 ms
Lead Channel Sensing Intrinsic Amplitude: 2.25 mV
Lead Channel Sensing Intrinsic Amplitude: 2.25 mV
Lead Channel Sensing Intrinsic Amplitude: 6.625 mV
Lead Channel Sensing Intrinsic Amplitude: 6.625 mV
Lead Channel Setting Pacing Amplitude: 1.5 V
Lead Channel Setting Pacing Amplitude: 2.5 V
Lead Channel Setting Pacing Pulse Width: 0.4 ms
Lead Channel Setting Sensing Sensitivity: 1.2 mV
Zone Setting Status: 755011

## 2022-11-20 ENCOUNTER — Ambulatory Visit (INDEPENDENT_AMBULATORY_CARE_PROVIDER_SITE_OTHER): Payer: Medicare Other | Admitting: Podiatry

## 2022-11-20 ENCOUNTER — Encounter: Payer: Self-pay | Admitting: Podiatry

## 2022-11-20 DIAGNOSIS — M79675 Pain in left toe(s): Secondary | ICD-10-CM

## 2022-11-20 DIAGNOSIS — B351 Tinea unguium: Secondary | ICD-10-CM | POA: Diagnosis not present

## 2022-11-20 DIAGNOSIS — M79674 Pain in right toe(s): Secondary | ICD-10-CM | POA: Diagnosis not present

## 2022-11-20 DIAGNOSIS — E1142 Type 2 diabetes mellitus with diabetic polyneuropathy: Secondary | ICD-10-CM | POA: Diagnosis not present

## 2022-11-20 NOTE — Progress Notes (Signed)
  Subjective:  Patient ID: GORETTI DUX, female    DOB: 07-12-1931,   MRN: [id number]  No chief complaint on file.   89 y.o. female presents for concern of thickened elongated and painful nails that are difficult to trim. Requesting to have them trimmed today. Relates burning and tingling in their feet. Patient is diabetic and last A1c was  Lab Results  Component Value Date   HGBA1C 4.9 01/23/2022   .   PCP:  Etta Grandchild, MD    . Denies any other pedal complaints. Denies n/v/f/c.   Past Medical History:  Diagnosis Date   Allergic rhinitis, cause unspecified    Anxiety state, unspecified    Arthritis    Colon polyp    Diaphragmatic hernia without mention of obstruction or gangrene    Diverticulosis of colon (without mention of hemorrhage)    Female stress incontinence    HTN (hypertension)    Insomnia, unspecified    Interstitial cystitis    surgery 02/01/2013   Irritable bowel syndrome    Memory loss    Other and unspecified hyperlipidemia    Other premature beats    Polyneuropathy in diabetes(357.2)    Postmenopausal bleeding    Proteinuria    Rectal ulcer    Skin cancer    Stroke (HCC) 2018   Type II or unspecified type diabetes mellitus with neurological manifestations, not stated as uncontrolled(250.60)    Unspecified essential hypertension    Viral warts, unspecified     Objective:  Physical Exam: Vascular: DP/PT pulses 2/4 bilateral. CFT <3 seconds. Absent hair growth on digits. Edema noted to bilateral lower extremities. Xerosis noted bilaterally.  Skin. No lacerations or abrasions bilateral feet. Nails 1-5 bilateral  are thickened discolored and elongated with subungual debris.  Musculoskeletal: MMT 5/5 bilateral lower extremities in DF, PF, Inversion and Eversion. Deceased ROM in DF of ankle joint.  Neurological: Sensation intact to light touch. Protective sensation diminished bilateral.    Assessment:   1. Pain due to onychomycosis of toenails of  both feet   2. Diabetic peripheral neuropathy (HCC)      Plan:  Patient was evaluated and treated and all questions answered. -Discussed and educated patient on diabetic foot care, especially with  regards to the vascular, neurological and musculoskeletal systems.  -Stressed the importance of good glycemic control and the detriment of not  controlling glucose levels in relation to the foot. -Discussed supportive shoes at all times and checking feet regularly.  -Mechanically debrided all nails 1-5 bilateral using sterile nail nipper and filed with dremel without incident  -Answered all patient questions -Patient to return  in 3 months for at risk foot care -Patient advised to call the office if any problems or questions arise in the meantime.   Louann Sjogren, DPM

## 2022-11-25 ENCOUNTER — Encounter: Payer: Self-pay | Admitting: Gastroenterology

## 2022-11-25 ENCOUNTER — Ambulatory Visit (INDEPENDENT_AMBULATORY_CARE_PROVIDER_SITE_OTHER): Payer: Medicare Other | Admitting: Gastroenterology

## 2022-11-25 VITALS — BP 130/78 | HR 68 | Ht 62.0 in | Wt 132.0 lb

## 2022-11-25 DIAGNOSIS — K5909 Other constipation: Secondary | ICD-10-CM | POA: Diagnosis not present

## 2022-11-25 NOTE — Progress Notes (Signed)
Chief Complaint: Worsening constipation Primary GI MD: Dr. Adela Lank  HPI: 89 year old female history of A-fib with CVA (on Eliquis), hypertension, diabetes type 2, anemia, colonic AVMs, presents with her son for evaluation of constipation.  Last seen 01/2022 by Dr. Adela Lank for iron deficiency anemia Patient has undergone EGD and colonoscopy (see below) colonoscopy showed large colonic AVMs thought to be source of iron deficiency in the setting of anticoagulation.  Patient is on chronic iron oral supplement with a baseline hemoglobin 11.5-13.  She also has B12 deficiency, thiamine deficiency, zinc deficiency and is on B12 shot and niacin/zinc supplementation.  At last office visit she was having alternating constipation and loose stools in which Citrucel was providing adequate management.  Dr. Adela Lank advised against procedures since she was pending pacemaker placement for symptomatic bradycardia at that time and her ferritin was only slightly low.  Recommended to increase iron to 2 tablets daily and stop PPI in order to switch to Pepcid 20 Mg twice daily to see if it helps with her B12 deficiency.  Over the last 8 months her hemoglobin has ranged from 11.0-11.9 with the most recent draw 09/05/2022 at 11.4.  MCV 95.  Patient received PPM/ICD implanted 05/15/2022  Patient states she has struggled intermittently with constipation for most of her life.  States more recently it has become progressively worse as she now has to manually disimpact herself on occasion and will have small volume bowel movements frequently throughout the day.  She is on 1 tablet of iron daily.  She was given Trulance by her PCP which resulted in severe weakness and diarrhea.  She uses magnesium citrate with relief.  She states sometimes when she goes to urinate she will pass a bowel movement.  Denies melena/hematochezia.  Denies weight loss.  Reports some abdominal bloating but no pain.   PREVIOUS GI WORKUP   EGD  12/16/18 -  - Localized mucosal change, around 5mm in diameter, were found in the middle third of the esophagus around 26cm of the incisors. Biopsies show glycogenic acanthosis. The exam of the esophagus was otherwise normal. Gastritis, negative biopsies for HP. A large diverticulum was found in the second portion of the duodenum. The exam of the duodenum was otherwise normal.   Colonoscopy 12/16/18 - Four non-bleeding colonic angiodysplastic lesions, one of them quite large at the IC valve. One 5 mm polyp in the descending colon. Diverticulosis in the sigmoid colon. Friability in the sigmoid colon with inflammatory changes Biopsied. Tattooed. - One 3 mm polyp at the recto-sigmoid colon. The examination was otherwise normal.   1. Surgical [P], gastric antrum and gastric body - ANTRAL AND OXYNTIC MUCOSA WITH SLIGHT CHRONIC INFLAMMATION. [place] NEGATIVE FOR HELICOBACTER PYLORI. - NO INTESTINAL METAPLASIA, DYSPLASIA OR MALIGNANCY. 2. Surgical [P], esophagus biopsies at 26cm - SQUAMOUS MUCOSA WITH INCREASED GLYCOGEN CONSISTENT WITH GLYCOGENIC ACANTHOSIS. - NO DYSPLASIA OR MALIGNANCY. 3. Surgical [P], colon, rectosigmoid, descending, polyp (2) - TUBULAR ADENOMA(S). - NO HIGH GRADE DYSPLASIA OR MALIGNANCY. 4. Surgical [P], colon, sigmoid - COLONIC MUCOSA WITH HYPEREMIA AND EDEMA. - NO ACTIVE INFLAMMATION, GRANULOMAS OR ADENOMATOUS CHANGE. - DIFFERENTIAL INCLUDES DIVERTICULITIS RELATED CHANGES.     CT Scan  - 02/05/19 - IMPRESSION: 1. No explanation for patient's left lower quadrant pain status post colonoscopy. 2.  Aortic Atherosclerosis (ICD10-I70.0). 3. Infrarenal abdominal aortic ectasia. Ectatic abdominal aorta at risk for aneurysm development. Recommend followup by ultrasound in 5 years. This recommendation follows ACR consensus guidelines: White Paper of the ACR Incidental Findings Committee  II on Vascular Findings. J Am Coll Radiol 2013; 10:789-794. 4. Advanced lumbar  spondylosis noted.     CT abdomen / pelvis 07/16/21: MPRESSION: No acute traumatic injury to the chest, abdomen, or pelvis. Acute T12 compression fracture versus incomplete burst fracture with 2 mm retropulsion into the central canal.  No acute fracture or traumatic malalignment of the lumbar spine. Scattered colonic diverticulosis with no acute diverticulitis. Partially visualized lipomatous lesion along the proximal left thigh musculature  Past Medical History:  Diagnosis Date   Allergic rhinitis, cause unspecified    Anxiety state, unspecified    Arthritis    Colon polyp    Diaphragmatic hernia without mention of obstruction or gangrene    Diverticulosis of colon (without mention of hemorrhage)    Female stress incontinence    HTN (hypertension)    Insomnia, unspecified    Interstitial cystitis    surgery 02/01/2013   Irritable bowel syndrome    Memory loss    Other and unspecified hyperlipidemia    Other premature beats    Polyneuropathy in diabetes(357.2)    Postmenopausal bleeding    Proteinuria    Rectal ulcer    Skin cancer    Stroke (HCC) 2018   Type II or unspecified type diabetes mellitus with neurological manifestations, not stated as uncontrolled(250.60)    Unspecified essential hypertension    Viral warts, unspecified     Past Surgical History:  Procedure Laterality Date   ABDOMINAL HYSTERECTOMY  1977   Dr.Hambright   APPENDECTOMY  1965   CATARACT EXTRACTION, BILATERAL  02/10/2006   Dr.Groat    COLONOSCOPY  07/17/2004   polypectomy   CYSTOSCOPY  1987   Dr.Bradley   LOOP RECORDER INSERTION N/A 07/31/2016   Procedure: Loop Recorder Insertion;  Surgeon: Marinus Maw, MD;  Location: [facility]ASIVE [facility];  Service: Cardiovascular;  Laterality: N/A;   LOOP RECORDER REMOVAL N/A 05/15/2022   Procedure: LOOP RECORDER REMOVAL;  Surgeon: Marinus Maw, MD;  Location: [facility];  Service: Cardiovascular;  Laterality: N/A;   PACEMAKER IMPLANT N/A  05/15/2022   Procedure: PACEMAKER IMPLANT;  Surgeon: Marinus Maw, MD;  Location: [facility];  Service: Cardiovascular;  Laterality: N/A;   TEE WITHOUT CARDIOVERSION N/A 07/30/2016   Procedure: TRANSESOPHAGEAL ECHOCARDIOGRAM (TEE);  Surgeon: Wendall Stade, MD;  Location: [facility] ENDOSCOPY;  Service: Cardiovascular;  Laterality: N/A;   TRIGGER FINGER RELEASE  1990   Dr.Carter   TRIGGER FINGER RELEASE  05/13/16   urinary bladder      surgery 02/01/2013 to stretch bladder stem    Current Outpatient Medications  Medication Sig Dispense Refill   ACCU-CHEK AVIVA PLUS test strip USE TO TEST BLOOD SUGAR TWICE DAILY. 100 each 11   acetaminophen (TYLENOL) 500 MG tablet Take 500 mg by mouth every 6 (six) hours as needed (pain).     amLODipine (NORVASC) 2.5 MG tablet Take 2.5 mg by mouth at bedtime.     apixaban (ELIQUIS) 2.5 MG TABS tablet TAKE 1 TABLET TWICE A DAY 180 tablet 1   atorvastatin (LIPITOR) 40 MG tablet TAKE 1 TABLET AT BEDTIME 90 tablet 0   citalopram (CELEXA) 10 MG tablet TAKE 1 TABLET DAILY 90 tablet 1   dapagliflozin propanediol (FARXIGA) 10 MG TABS tablet TAKE 1 TABLET DAILY BEFORE BREAKFAST 90 tablet 0   famotidine (PEPCID) 20 MG tablet Take one tablet once to twice a day 60 tablet 0   ferrous sulfate 325 (65 FE) MG EC tablet Take  325 mg by mouth daily with breakfast. With Vitamin C     gabapentin (NEURONTIN) 300 MG capsule TAKE 1 CAPSULE THREE TIMES A DAY 270 capsule 3   Lancets (ACCU-CHEK MULTICLIX) lancets Use to test blood sugar twice daily. E11.29 100 each 11   levocetirizine (XYZAL) 5 MG tablet TAKE ONE-HALF (1/2) TABLET (2.5 MG) EVERY OTHER DAY 23 tablet 3   Plecanatide (TRULANCE) 3 MG TABS Take 1 tablet (3 mg total) by mouth daily. 90 tablet 1   Vitamin D, Ergocalciferol, (DRISDOL) 1.25 MG (50000 UNIT) CAPS capsule TAKE 1 CAPSULE EVERY 7 DAYS 12 capsule 0   No current facility-administered medications for this visit.    Allergies as of 11/25/2022 - Review Complete  11/20/2022  Allergen Reaction Noted   Adhesive [tape] Rash and Other (See Comments) 06/15/2017   Ceclor [cefaclor] Other (See Comments) 08/28/2012   Codeine Nausea And Vomiting 11/20/2020   Jardiance [empagliflozin] Diarrhea 11/20/2020   Lopid [gemfibrozil] Other (See Comments) 08/28/2012   Metformin and related Diarrhea 11/20/2020   Metoprolol Nausea And Vomiting 07/18/2015   Morphine and codeine Other (See Comments) 11/20/2020   Niacin and related Other (See Comments) 11/20/2020   Relafen [nabumetone] Nausea And Vomiting 11/20/2020   Septra [sulfamethoxazole-trimethoprim] Nausea And Vomiting 11/20/2020   Tetracyclines & related Nausea And Vomiting 11/20/2020    Family History  Problem Relation Age of Onset   Pancreatic cancer Mother 83   Heart attack Father    Bladder Cancer Father    Hypertension Sister    Diabetes Sister    COPD Sister    Hypertension Son    Diabetes Brother    Heart disease Brother    Hypertension Brother    Diabetes Brother    Heart disease Brother 22       Myocardial Infarction    Heart attack Brother     Social History   Socioeconomic History   Marital status: Widowed    Spouse name: Not on file   Number of children: 1   Years of education: Not on file   Highest education level: Not on file  Occupational History   Occupation: Teacher retired    Comment: Engineer, agricultural   Tobacco Use   Smoking status: Former    Current packs/day: 0.00    Average packs/day: 0.3 packs/day for 62.0 years (15.5 ttl pk-yrs)    Types: Cigarettes    Start date: 04/01/1949    Quit date: 04/02/2011    Years since quitting: 11.6   Smokeless tobacco: Never   Tobacco comments:    stopped consistently smoking in 2012 but still smokes occasionally (one pack per week)  Vaping Use   Vaping status: Never Used  Substance and Sexual Activity   Alcohol use: No   Drug use: No   Sexual activity: Not Currently  Other Topics Concern   Not on file  Social History Narrative    Ophthalmologist-Dr.Gould and Dr.Groat   Podiatrist- Dr.Tuchman   Dermatologist- Dr.Lupton   Urologist- Dr. Darvin Neighbours    Lives with her son.    Social Determinants of Health   Financial Resource Strain: Low Risk  (08/30/2020)   Overall Financial Resource Strain (CARDIA)    Difficulty of Paying Living Expenses: Not hard at all  Food Insecurity: No Food Insecurity (08/30/2020)   Hunger Vital Sign    Worried About Running Out of Food in the Last Year: Never true    Ran Out of Food in the Last Year: Never true  Transportation Needs:  No Transportation Needs (08/30/2020)   PRAPARE - Administrator, Civil Service (Medical): No    Lack of Transportation (Non-Medical): No  Physical Activity: Inactive (08/30/2020)   Exercise Vital Sign    Days of Exercise per Week: 0 days    Minutes of Exercise per Session: 0 min  Stress: No Stress Concern Present (08/30/2020)   [employer] of Occupational Health - Occupational Stress Questionnaire    Feeling of Stress : Not at all  Social Connections: Moderately Integrated (08/30/2020)   Social Connection and Isolation Panel [NHANES]    Frequency of Communication with Friends and Family: More than three times a week    Frequency of Social Gatherings with Friends and Family: Three times a week    Attends Religious Services: More than 4 times per year    Active Member of Clubs or Organizations: No    Attends Banker Meetings: More than 4 times per year    Marital Status: Widowed  Intimate Partner Violence: Not At Risk (08/30/2020)   Humiliation, Afraid, Rape, and Kick questionnaire    Fear of Current or Ex-Partner: No    Emotionally Abused: No    Physically Abused: No    Sexually Abused: No    Review of Systems:    Constitutional: No weight loss, fever, chills, weakness or fatigue HEENT: Eyes: No change in vision               Ears, Nose, Throat:  No change in hearing or congestion Skin: No rash or itching Cardiovascular: No chest  pain, chest pressure or palpitations   Respiratory: No SOB or cough Gastrointestinal: See HPI and otherwise negative Genitourinary: No dysuria or change in urinary frequency Neurological: No headache, dizziness or syncope Musculoskeletal: No new muscle or joint pain Hematologic: No bleeding or bruising Psychiatric: No history of depression or anxiety    Physical Exam:  Vital signs: There were no vitals taken for this visit.  Constitutional: Elderly frail female Head:  Normocephalic and atraumatic. Eyes:   PEERL, EOMI. No icterus. Conjunctiva pink. Respiratory: Respirations even and unlabored. Lungs clear to auscultation bilaterally.   No wheezes, crackles, or rhonchi.  Cardiovascular:  Regular rate and rhythm. No peripheral edema, cyanosis or pallor.  Gastrointestinal:  Soft, nondistended, nontender. No rebound or guarding.  Hypoactive bowel sounds. No appreciable masses or hepatomegaly. Rectal:  Not performed.  Msk:  Symmetrical without gross deformities. Without edema, no deformity or joint abnormality.  Neurologic:  Alert and  oriented x4;  grossly normal neurologically.  Skin:   Dry and intact without significant lesions or rashes. Psychiatric: Oriented to person, place and time. Demonstrates good judgement and reason without abnormal affect or behaviors.   RELEVANT LABS AND IMAGING: CBC    Component Value Date/Time   WBC 7.2 04/15/2022 1539   WBC 8.1 04/04/2022 1410   RBC 3.77 04/15/2022 1539   RBC 3.78 (L) 04/04/2022 1410   HGB 11.4 04/15/2022 1539   HCT 35.8 04/15/2022 1539   PLT 186 04/15/2022 1539   MCV 95 04/15/2022 1539   MCH 30.2 04/15/2022 1539   MCH 31.8 03/19/2022 0442   MCHC 31.8 04/15/2022 1539   MCHC 32.3 04/04/2022 1410   RDW 12.8 04/15/2022 1539   LYMPHSABS 1.2 04/04/2022 1410   MONOABS 0.8 04/04/2022 1410   EOSABS 0.1 04/04/2022 1410   BASOSABS 0.1 04/04/2022 1410    CMP     Component Value Date/Time   NA 141 09/05/2022 1455   NA  141  04/15/2022 1539   K 4.0 09/05/2022 1455   CL 104 09/05/2022 1455   CO2 28 09/05/2022 1455   GLUCOSE 78 09/05/2022 1455   BUN 22 09/05/2022 1455   BUN 14 04/15/2022 1539   CREATININE 1.29 (H) 09/05/2022 1455   CREATININE 1.42 (H) 08/09/2019 0903   CALCIUM 9.6 09/05/2022 1455   PROT 4.6 (L) 03/19/2022 0442   PROT 6.1 06/23/2015 0927   ALBUMIN 2.5 (L) 03/19/2022 0442   ALBUMIN 3.9 06/23/2015 0927   AST 16 03/19/2022 0442   ALT 13 03/19/2022 0442   ALKPHOS 55 03/19/2022 0442   BILITOT 0.3 03/19/2022 0442   BILITOT 0.3 06/23/2015 0927   GFRNONAA 40 (L) 03/19/2022 0442   GFRNONAA 34 (L) 08/09/2019 0903   GFRAA 39 (L) 08/09/2019 0903     Assessment/Plan:   Constipation Up-to-date on colonoscopy (last done in 2020) with longstanding history of chronic constipation likely exacerbated by chronic iron supplementation.  Adverse side effects with Trulance.  Previously well-controlled on Citrucel and now not adequately controlled.  Patient is very high risk to undergo colonoscopy at this time with recent pacemaker placement, age, comorbidities.  Will proceed with conservative management as she has had a colonoscopy within the last 5 years and no red flag symptoms, this is reassuring  --- Start taking Miralax 1 capful (17 grams) 1x / day for 1 week.   If this is not effective, increase to 1 dose 2x / day for 1 week.   If this is still not effective, increase to two capfuls (34 grams) 2x / day.   Can adjust dose as needed based on response. Can take 1/2 cap daily, skip days, or increase per day.   --- Squatty potty with bowel movements to make sure knees are both hips defecating to help with structural constipation --- Intake water of 60 to 100 ounces per day --- Exercise as able --- Follow-up in 3 months.     Lara [facility] [facility] 11/25/2022, 11:55 AM  Cc: Etta Grandchild, MD

## 2022-11-25 NOTE — Patient Instructions (Addendum)
Start taking Miralax 1 capful (17 grams) 1x / day for 1 week.   If this is not effective, increase to 1 dose 2x / day for 1 week.   If this is still not effective, increase to two capfuls (34 grams) 2x / day.   Can adjust dose as needed based on response. Can take 1/2 cap daily, skip days, or increase per day.    Continue citracel daily  SQUATTY POTTY for bowel movements  Drink 60-100oz water per day.  _______________________________________________________  If your blood pressure at your visit was 140/90 or greater, please contact your primary care physician to follow up on this.  _______________________________________________________  If you are age 33 or older, your body mass index should be between 23-30. Your Body mass index is 24.14 kg/m. If this is out of the aforementioned range listed, please consider follow up with your Primary Care Provider.  If you are age 46 or younger, your body mass index should be between 19-25. Your Body mass index is 24.14 kg/m. If this is out of the aformentioned range listed, please consider follow up with your Primary Care Provider.   ________________________________________________________  The [facility] providers would like to encourage you to use [facility] to communicate with providers for non-urgent requests or questions.  Due to long hold times on the telephone, sending your provider a message by [facility] may be a faster and more efficient way to get a response.  Please allow 48 business hours for a response.  Please remember that this is for non-urgent requests.  _______________________________________________________  I appreciate the opportunity to care for you. Bayley McMichael PA-C

## 2022-11-25 NOTE — Progress Notes (Signed)
Remote pacemaker transmission.   

## 2022-11-25 NOTE — Progress Notes (Signed)
Agree with assessment and plan as outlined.  

## 2022-12-03 DIAGNOSIS — M79644 Pain in right finger(s): Secondary | ICD-10-CM | POA: Diagnosis not present

## 2022-12-17 ENCOUNTER — Ambulatory Visit (INDEPENDENT_AMBULATORY_CARE_PROVIDER_SITE_OTHER): Payer: Medicare Other

## 2022-12-17 ENCOUNTER — Other Ambulatory Visit: Payer: Self-pay | Admitting: Internal Medicine

## 2022-12-17 VITALS — Ht 62.0 in | Wt 132.0 lb

## 2022-12-17 DIAGNOSIS — Z0000 Encounter for general adult medical examination without abnormal findings: Secondary | ICD-10-CM

## 2022-12-17 DIAGNOSIS — E559 Vitamin D deficiency, unspecified: Secondary | ICD-10-CM

## 2022-12-17 NOTE — Progress Notes (Signed)
Subjective:   Penny Miller is a 89 y.o. female who presents for Medicare Annual (Subsequent) preventive examination.  Visit Complete: Virtual  I connected with  Penny Miller on 12/17/22 by a audio enabled telemedicine application and verified that I am speaking with the correct person using two identifiers.  Patient Location: Home  Provider Location: Office/Clinic  I discussed the limitations of evaluation and management by telemedicine. The patient expressed understanding and agreed to proceed.  Vital Signs: Because this visit was a virtual/telehealth visit, some criteria may be missing or patient reported. Any vitals not documented were not able to be obtained and vitals that have been documented are patient reported.    Cardiac Risk Factors include: advanced age (>80men, >47 women);hypertension;sedentary lifestyle;family history of premature cardiovascular disease     Objective:    Today's Vitals   12/17/22 1503 12/17/22 1505  Weight: 132 lb (59.9 kg)   Height: 5\' 2"  (1.575 m)   PainSc: 2  2   PainLoc: Leg    Body mass index is 24.14 kg/m.     12/17/2022    3:06 PM 05/15/2022    6:45 AM 11/05/2021    4:50 PM 07/16/2021   11:28 PM 11/20/2020    5:49 PM 03/15/2020   11:11 AM 08/12/2019   10:13 AM  Advanced Directives  Does Patient Have a Medical Advance Directive? No -- Yes No No No No  Type of Best boy of [place];Living will      Does patient want to make changes to medical advance directive?   No - Patient declined    No - Patient declined  Copy of Healthcare Power of Attorney in Chart?   No - copy requested      Would patient like information on creating a medical advance directive? No - Patient declined     No - Patient declined Yes (ED - Information included in AVS)    Current Medications (verified) Outpatient Encounter Medications as of 12/17/2022  Medication Sig   ACCU-CHEK AVIVA PLUS test strip USE TO TEST BLOOD SUGAR TWICE  DAILY.   acetaminophen (TYLENOL) 500 MG tablet Take 500 mg by mouth every 6 (six) hours as needed (pain).   amLODipine (NORVASC) 2.5 MG tablet Take 2.5 mg by mouth at bedtime.   apixaban (ELIQUIS) 2.5 MG TABS tablet TAKE 1 TABLET TWICE A DAY   atorvastatin (LIPITOR) 40 MG tablet TAKE 1 TABLET AT BEDTIME   citalopram (CELEXA) 10 MG tablet TAKE 1 TABLET DAILY   dapagliflozin propanediol (FARXIGA) 10 MG TABS tablet TAKE 1 TABLET DAILY BEFORE BREAKFAST   famotidine (PEPCID) 20 MG tablet Take one tablet once to twice a day   ferrous sulfate 325 (65 FE) MG EC tablet Take 325 mg by mouth daily with breakfast. With Vitamin C   gabapentin (NEURONTIN) 300 MG capsule TAKE 1 CAPSULE THREE TIMES A DAY   Lancets (ACCU-CHEK MULTICLIX) lancets Use to test blood sugar twice daily. E11.29   levocetirizine (XYZAL) 5 MG tablet TAKE ONE-HALF (1/2) TABLET (2.5 MG) EVERY OTHER DAY   Plecanatide (TRULANCE) 3 MG TABS Take 1 tablet (3 mg total) by mouth daily.   Vitamin D, Ergocalciferol, (DRISDOL) 1.25 MG (50000 UNIT) CAPS capsule TAKE 1 CAPSULE EVERY 7 DAYS   No facility-administered encounter medications on file as of 12/17/2022.    Allergies (verified) Adhesive [tape], Ceclor [cefaclor], Codeine, Jardiance [empagliflozin], Lopid [gemfibrozil], Metformin and related, Metoprolol, Morphine and codeine, Niacin and related, Relafen [nabumetone], Septra [  sulfamethoxazole-trimethoprim], and Tetracyclines & related   History: Past Medical History:  Diagnosis Date   Allergic rhinitis, cause unspecified    Anxiety state, unspecified    Arthritis    Colon polyp    Diaphragmatic hernia without mention of obstruction or gangrene    Diverticulosis of colon (without mention of hemorrhage)    Female stress incontinence    HTN (hypertension)    Insomnia, unspecified    Interstitial cystitis    surgery 02/01/2013   Irritable bowel syndrome    Memory loss    Other and unspecified hyperlipidemia    Other premature beats     Pacemaker 2024   Polyneuropathy in diabetes(357.2)    Postmenopausal bleeding    Proteinuria    Rectal ulcer    Skin cancer    Stroke (HCC) 2018   Type II or unspecified type diabetes mellitus with neurological manifestations, not stated as uncontrolled(250.60)    Unspecified essential hypertension    Viral warts, unspecified    Past Surgical History:  Procedure Laterality Date   ABDOMINAL HYSTERECTOMY  1977   Dr.Hambright   APPENDECTOMY  1965   CATARACT EXTRACTION, BILATERAL  02/10/2006   Dr.Groat    COLONOSCOPY  07/17/2004   polypectomy   CYSTOSCOPY  1987   Dr.Bradley   LOOP RECORDER INSERTION N/A 07/31/2016   Procedure: Loop Recorder Insertion;  Surgeon: Marinus Maw, MD;  Location: [facility]ASIVE [facility];  Service: Cardiovascular;  Laterality: N/A;   LOOP RECORDER REMOVAL N/A 05/15/2022   Procedure: LOOP RECORDER REMOVAL;  Surgeon: Marinus Maw, MD;  Location: [facility];  Service: Cardiovascular;  Laterality: N/A;   PACEMAKER IMPLANT N/A 05/15/2022   Procedure: PACEMAKER IMPLANT;  Surgeon: Marinus Maw, MD;  Location: [facility];  Service: Cardiovascular;  Laterality: N/A;   TEE WITHOUT CARDIOVERSION N/A 07/30/2016   Procedure: TRANSESOPHAGEAL ECHOCARDIOGRAM (TEE);  Surgeon: Wendall Stade, MD;  Location: [facility] ENDOSCOPY;  Service: Cardiovascular;  Laterality: N/A;   TRIGGER FINGER RELEASE  1990   Dr.Carter   TRIGGER FINGER RELEASE  05/13/16   urinary bladder      surgery 02/01/2013 to stretch bladder stem   Family History  Problem Relation Age of Onset   Pancreatic cancer Mother 42   Heart attack Father    Bladder Cancer Father    Hypertension Sister    Diabetes Sister    COPD Sister    Hypertension Son    Diabetes Brother    Heart disease Brother    Hypertension Brother    Diabetes Brother    Heart disease Brother 53       Myocardial Infarction    Heart attack Brother    Social History   Socioeconomic History   Marital status: Widowed     Spouse name: Not on file   Number of children: 1   Years of education: Not on file   Highest education level: Not on file  Occupational History   Occupation: Teacher retired    Comment: Engineer, agricultural   Tobacco Use   Smoking status: Former    Current packs/day: 0.00    Average packs/day: 0.3 packs/day for 62.0 years (15.5 ttl pk-yrs)    Types: Cigarettes    Start date: 04/01/1949    Quit date: 04/02/2011    Years since quitting: 11.7   Smokeless tobacco: Never   Tobacco comments:    stopped consistently smoking in 2012 but still smokes occasionally (one pack per week)  Vaping Use  Vaping status: Never Used  Substance and Sexual Activity   Alcohol use: No   Drug use: No   Sexual activity: Not Currently  Other Topics Concern   Not on file  Social History Narrative   Ophthalmologist-Dr.Gould and Dr.Groat   Podiatrist- Dr.Tuchman   Dermatologist- Dr.Lupton   Urologist- Dr. Darvin Neighbours    Lives with her son.    Social Determinants of Health   Financial Resource Strain: Low Risk  (12/17/2022)   Overall Financial Resource Strain (CARDIA)    Difficulty of Paying Living Expenses: Not hard at all  Food Insecurity: No Food Insecurity (12/17/2022)   Hunger Vital Sign    Worried About Running Out of Food in the Last Year: Never true    Ran Out of Food in the Last Year: Never true  Transportation Needs: No Transportation Needs (12/17/2022)   PRAPARE - Administrator, Civil Service (Medical): No    Lack of Transportation (Non-Medical): No  Physical Activity: Inactive (12/17/2022)   Exercise Vital Sign    Days of Exercise per Week: 0 days    Minutes of Exercise per Session: 0 min  Stress: No Stress Concern Present (12/17/2022)   [employer] of Occupational Health - Occupational Stress Questionnaire    Feeling of Stress : Not at all  Social Connections: Moderately Integrated (12/17/2022)   Social Connection and Isolation Panel [NHANES]    Frequency of Communication with  Friends and Family: More than three times a week    Frequency of Social Gatherings with Friends and Family: Three times a week    Attends Religious Services: More than 4 times per year    Active Member of Clubs or Organizations: No    Attends Banker Meetings: More than 4 times per year    Marital Status: Widowed    Tobacco Counseling Counseling given: Not Answered Tobacco comments: stopped consistently smoking in 2012 but still smokes occasionally (one pack per week)   Clinical Intake:  Pre-visit preparation completed: Yes  Pain : 0-10 Pain Score: 2  Pain Type: Chronic pain Pain Location: Leg Pain Orientation: Left Pain Descriptors / Indicators: Aching, Nagging, Discomfort Effect of Pain on Daily Activities: Pain can diminish job performance, lower motivation to exercise and prevent you from completing daily tasks.  Pain produces disability and affects the quality of life.     BMI - recorded: 24.14 Nutritional Status: BMI of 19-24  Normal Nutritional Risks: None Diabetes: Yes CBG done?: No Did pt. bring in CBG monitor from home?: No  How often do you need to have someone help you when you read instructions, pamphlets, or other written materials from your doctor or pharmacy?: 1 - Never What is the last grade level you completed in school?: HSG  Interpreter Needed?: No  Information entered by :: Susie Cassette, LPN.   Activities of Daily Living    12/17/2022    3:11 PM  In your present state of health, do you have any difficulty performing the following activities:  Hearing? 0  Vision? 0  Difficulty concentrating or making decisions? 0  Walking or climbing stairs? 0  Dressing or bathing? 0  Doing errands, shopping? 0  Preparing Food and eating ? N  Using the Toilet? N  In the past six months, have you accidently leaked urine? Y  Do you have problems with loss of bowel control? Y  Managing your Medications? N  Managing your Finances? N   Housekeeping or managing your Housekeeping? N  Patient Care Team: Etta Grandchild, MD as PCP - General (Internal Medicine) Rollene Rotunda, MD as PCP - Cardiology (Cardiology) Marinus Maw, MD as PCP - Electrophysiology (Cardiology) Max Fickle, MD as Referring Physician (Dermatology) Elise Benne, MD as Consulting Physician (Ophthalmology) Carrington Clamp, DPM (Inactive) as Consulting Physician (Podiatry) Ernesto Rutherford, MD as Consulting Physician (Ophthalmology) Esaw Dace, MD as Attending Physician (Urology) Rollene Rotunda, MD as Consulting Physician (Cardiology) Shawnie Dapper, NP as Nurse Practitioner (Family Medicine) Armbruster, Willaim Rayas, MD as Consulting Physician (Gastroenterology) Etta Grandchild, MD (Internal Medicine)  Indicate any recent Medical Services you may have received from other than [facility] providers in the past year (date may be approximate).     Assessment:   This is a routine wellness examination for Ahlanna.  Hearing/Vision screen Hearing Screening - Comments:: Patient denied any hearing difficulty.   No hearing aids.   Vision Screening - Comments:: Patient does wear corrective lenses/contacts.  Annual eye exam done by: Ernesto Rutherford, MD.    Goals Addressed             This Visit's Progress    Client understands the importance of follow-up with providers by attending scheduled visits        Depression Screen    12/17/2022    3:18 PM 08/19/2022    3:59 PM 03/04/2022    9:12 AM 02/20/2022   11:05 AM 11/05/2021    4:48 PM 08/07/2021   10:12 AM 07/23/2021    2:30 PM  PHQ 2/9 Scores  PHQ - 2 Score 0 1 0 0 0 4 0  PHQ- 9 Score 3 3 3 5  0 19 6    Fall Risk    12/17/2022    3:10 PM 08/19/2022    3:58 PM 03/04/2022    9:12 AM 11/05/2021    4:48 PM 08/07/2021   10:13 AM  Fall Risk   Falls in the past year? 1 0 0 1 1  Number falls in past yr: 0 0 0 1 1  Injury with Fall? 1 0 0 1 1  Risk for fall due to : History of fall(s);Impaired  balance/gait;Orthopedic patient Impaired balance/gait No Fall Risks History of fall(s);Impaired balance/gait;Impaired mobility History of fall(s)  Follow up Education provided;Falls prevention discussed;Falls evaluation completed Falls evaluation completed Falls evaluation completed Falls prevention discussed;Education provided     MEDICARE RISK AT HOME: Medicare Risk at Home Any stairs in or around the home?: No (ramp on the front porch) If so, are there any without handrails?: No Home free of loose throw rugs in walkways, pet beds, electrical cords, etc?: Yes Adequate lighting in your home to reduce risk of falls?: Yes Life alert?: No Use of a cane, walker or w/c?: Yes Grab bars in the bathroom?: No Shower chair or bench in shower?: Yes Elevated toilet seat or a handicapped toilet?: Yes  TIMED UP AND GO:  Was the test performed?  No    Cognitive Function:    07/21/2017   10:23 AM 05/07/2016    9:18 AM 12/21/2013    3:42 PM  MMSE - Mini Mental State Exam  Orientation to time 5 5 5   Orientation to Place 5 4 4   Registration 3 3 3   Attention/ Calculation 5 5 5   Recall 3 3 2   Language- name 2 objects 2 2 2   Language- repeat 1 1 1   Language- follow 3 step command 3 3 3   Language- read & follow direction 1 1 1  Write a sentence 1 1 1   Copy design 1 1 1   Total score 30 29 28         07/27/2019   10:34 AM 07/24/2018   11:11 AM  6CIT Screen  What Year? 0 points 0 points  What month? 0 points 0 points  What time? 0 points 0 points  Count back from 20 0 points 0 points  Months in reverse 0 points 0 points  Repeat phrase 4 points 0 points  Total Score 4 points 0 points    Immunizations Immunization History  Administered Date(s) Administered   Fluad Quad(high Dose 65+) 02/03/2020, 12/26/2020, 01/23/2022   Influenza, High Dose Seasonal PF 12/31/2016, 12/15/2017, 01/25/2019   Influenza,inj,Quad PF,6+ Mos 12/15/2012, 12/21/2013, 02/28/2015, 02/02/2016   Influenza-Unspecified  01/22/2012   PFIZER(Purple Top)SARS-COV-2 Vaccination 05/14/2019, 06/08/2019   Pneumococcal Conjugate-13 05/07/2016   Pneumococcal Polysaccharide-23 11/03/2019   Pneumococcal-Unspecified 01/24/1995   Td 09/14/2008   Tdap 01/25/2020   Zoster Recombinant(Shingrix) 12/23/2019, 06/13/2020    TDAP status: Up to date  Flu Vaccine status: Due, Education has been provided regarding the importance of this vaccine. Advised may receive this vaccine at local pharmacy or Health Dept. Aware to provide a copy of the vaccination record if obtained from local pharmacy or Health Dept. Verbalized acceptance and understanding.  Pneumococcal vaccine status: Up to date  Covid-19 vaccine status: Completed vaccines  Qualifies for Shingles Vaccine? Yes   Zostavax completed No   Shingrix Completed?: Yes  Screening Tests Health Maintenance  Topic Date Due   OPHTHALMOLOGY EXAM  06/14/2020   HEMOGLOBIN A1C  07/25/2022   INFLUENZA VACCINE  10/31/2022   FOOT EXAM  08/14/2023   Medicare Annual Wellness (AWV)  12/17/2023   DTaP/Tdap/Td (3 - Td or Tdap) 01/24/2030   Pneumonia Vaccine 40+ Years old  Completed   DEXA SCAN  Completed   Zoster Vaccines- Shingrix  Completed   HPV VACCINES  Aged Out   COVID-19 Vaccine  Discontinued    Health Maintenance  Health Maintenance Due  Topic Date Due   OPHTHALMOLOGY EXAM  06/14/2020   HEMOGLOBIN A1C  07/25/2022   INFLUENZA VACCINE  10/31/2022    Colorectal cancer screening: No longer required.   Mammogram status: No longer required due to age.  Bone Density status: Completed 07/17/2022. Results reflect: Bone density results: OSTEOPOROSIS. Repeat every 2 years.  Lung Cancer Screening: (Low Dose CT Chest recommended if Age 48-80 years, 20 pack-year currently smoking OR have quit w/in 15years.) does not qualify.   Lung Cancer Screening Referral: no  Additional Screening:  Hepatitis C Screening: does not qualify; Completed no  Vision Screening: Recommended  annual ophthalmology exams for early detection of glaucoma and other disorders of the eye. Is the patient up to date with their annual eye exam?  Yes  Who is the provider or what is the name of the office in which the patient attends annual eye exams? Ernesto Rutherford, MD. If pt is not established with a provider, would they like to be referred to a provider to establish care? No .   Dental Screening: Recommended annual dental exams for proper oral hygiene  Diabetic Foot Exam: Diabetic Foot Exam: Completed 08/04/2022  Community Resource Referral / Chronic Care Management: CRR required this visit?  No   CCM required this visit?  No     Plan:     I have personally reviewed and noted the following in the patient's chart:   Medical and social history Use of alcohol, tobacco or  illicit drugs  Current medications and supplements including opioid prescriptions. Patient is not currently taking opioid prescriptions. Functional ability and status Nutritional status Physical activity Advanced directives List of other physicians Hospitalizations, surgeries, and ER visits in previous 12 months Vitals Screenings to include cognitive, depression, and falls Referrals and appointments  In addition, I have reviewed and discussed with patient certain preventive protocols, quality metrics, and best practice recommendations. A written personalized care plan for preventive services as well as general preventive health recommendations were provided to patient.     Mickeal Needy, LPN   5/62/1308   After Visit Summary: (Mail) Due to this being a telephonic visit, the after visit summary with patients personalized plan was offered to patient via mail   Nurse Notes: Patient has memory issues.

## 2022-12-17 NOTE — Patient Instructions (Addendum)
Penny Miller , Thank you for taking time to come for your Medicare Wellness Visit. I appreciate your ongoing commitment to your health goals. Please review the following plan we discussed and let me know if I can assist you in the future.   Referrals/Orders/Follow-Ups/Clinician Recommendations: No  This is a list of the screening recommended for you and due dates:  Health Maintenance  Topic Date Due   Eye exam for diabetics  06/14/2020   Hemoglobin A1C  07/25/2022   Flu Shot  10/31/2022   Complete foot exam   08/14/2023   Medicare Annual Wellness Visit  12/17/2023   DTaP/Tdap/Td vaccine (3 - Td or Tdap) 01/24/2030   Pneumonia Vaccine  Completed   DEXA scan (bone density measurement)  Completed   Zoster (Shingles) Vaccine  Completed   HPV Vaccine  Aged Out   COVID-19 Vaccine  Discontinued    Advanced directives: (Declined) Advance directive discussed with you today. Even though you declined this today, please call our office should you change your mind, and we can give you the proper paperwork for you to fill out.  Next Medicare Annual Wellness Visit scheduled for next year: Yes

## 2022-12-19 ENCOUNTER — Emergency Department (HOSPITAL_COMMUNITY): Payer: Medicare Other

## 2022-12-19 ENCOUNTER — Inpatient Hospital Stay (HOSPITAL_COMMUNITY)
Admission: EM | Admit: 2022-12-19 | Discharge: 2022-12-22 | DRG: 690 | Disposition: A | Discharge status: Home Health | Payer: Medicare Other | Attending: Internal Medicine | Admitting: Internal Medicine

## 2022-12-19 ENCOUNTER — Encounter (HOSPITAL_COMMUNITY): Payer: Self-pay

## 2022-12-19 ENCOUNTER — Other Ambulatory Visit: Payer: Self-pay

## 2022-12-19 DIAGNOSIS — Z66 Do not resuscitate: Secondary | ICD-10-CM | POA: Diagnosis present

## 2022-12-19 DIAGNOSIS — Z743 Need for continuous supervision: Secondary | ICD-10-CM | POA: Diagnosis not present

## 2022-12-19 DIAGNOSIS — N1831 Chronic kidney disease, stage 3a: Secondary | ICD-10-CM | POA: Diagnosis not present

## 2022-12-19 DIAGNOSIS — M47816 Spondylosis without myelopathy or radiculopathy, lumbar region: Secondary | ICD-10-CM | POA: Diagnosis present

## 2022-12-19 DIAGNOSIS — Z825 Family history of asthma and other chronic lower respiratory diseases: Secondary | ICD-10-CM

## 2022-12-19 DIAGNOSIS — I129 Hypertensive chronic kidney disease with stage 1 through stage 4 chronic kidney disease, or unspecified chronic kidney disease: Secondary | ICD-10-CM | POA: Diagnosis present

## 2022-12-19 DIAGNOSIS — I4819 Other persistent atrial fibrillation: Secondary | ICD-10-CM | POA: Diagnosis present

## 2022-12-19 DIAGNOSIS — Z800 Family history of malignant neoplasm of digestive organs: Secondary | ICD-10-CM

## 2022-12-19 DIAGNOSIS — M4854XA Collapsed vertebra, not elsewhere classified, thoracic region, initial encounter for fracture: Secondary | ICD-10-CM | POA: Diagnosis present

## 2022-12-19 DIAGNOSIS — S0990XA Unspecified injury of head, initial encounter: Secondary | ICD-10-CM | POA: Diagnosis not present

## 2022-12-19 DIAGNOSIS — Z833 Family history of diabetes mellitus: Secondary | ICD-10-CM

## 2022-12-19 DIAGNOSIS — K589 Irritable bowel syndrome without diarrhea: Secondary | ICD-10-CM | POA: Diagnosis present

## 2022-12-19 DIAGNOSIS — Z043 Encounter for examination and observation following other accident: Secondary | ICD-10-CM | POA: Diagnosis not present

## 2022-12-19 DIAGNOSIS — M5137 Other intervertebral disc degeneration, lumbosacral region: Secondary | ICD-10-CM | POA: Diagnosis not present

## 2022-12-19 DIAGNOSIS — N393 Stress incontinence (female) (male): Secondary | ICD-10-CM | POA: Diagnosis present

## 2022-12-19 DIAGNOSIS — I672 Cerebral atherosclerosis: Secondary | ICD-10-CM | POA: Diagnosis not present

## 2022-12-19 DIAGNOSIS — I6782 Cerebral ischemia: Secondary | ICD-10-CM | POA: Diagnosis not present

## 2022-12-19 DIAGNOSIS — E1129 Type 2 diabetes mellitus with other diabetic kidney complication: Secondary | ICD-10-CM | POA: Diagnosis present

## 2022-12-19 DIAGNOSIS — Z7901 Long term (current) use of anticoagulants: Secondary | ICD-10-CM

## 2022-12-19 DIAGNOSIS — R531 Weakness: Secondary | ICD-10-CM

## 2022-12-19 DIAGNOSIS — N1832 Chronic kidney disease, stage 3b: Secondary | ICD-10-CM | POA: Diagnosis present

## 2022-12-19 DIAGNOSIS — Z8052 Family history of malignant neoplasm of bladder: Secondary | ICD-10-CM

## 2022-12-19 DIAGNOSIS — R109 Unspecified abdominal pain: Secondary | ICD-10-CM | POA: Diagnosis not present

## 2022-12-19 DIAGNOSIS — I480 Paroxysmal atrial fibrillation: Secondary | ICD-10-CM | POA: Diagnosis not present

## 2022-12-19 DIAGNOSIS — R339 Retention of urine, unspecified: Secondary | ICD-10-CM | POA: Diagnosis not present

## 2022-12-19 DIAGNOSIS — Z87891 Personal history of nicotine dependence: Secondary | ICD-10-CM

## 2022-12-19 DIAGNOSIS — M5125 Other intervertebral disc displacement, thoracolumbar region: Secondary | ICD-10-CM | POA: Diagnosis not present

## 2022-12-19 DIAGNOSIS — F411 Generalized anxiety disorder: Secondary | ICD-10-CM | POA: Diagnosis present

## 2022-12-19 DIAGNOSIS — R6889 Other general symptoms and signs: Secondary | ICD-10-CM | POA: Diagnosis not present

## 2022-12-19 DIAGNOSIS — Z950 Presence of cardiac pacemaker: Secondary | ICD-10-CM

## 2022-12-19 DIAGNOSIS — Z85828 Personal history of other malignant neoplasm of skin: Secondary | ICD-10-CM

## 2022-12-19 DIAGNOSIS — G4700 Insomnia, unspecified: Secondary | ICD-10-CM | POA: Diagnosis present

## 2022-12-19 DIAGNOSIS — N3000 Acute cystitis without hematuria: Secondary | ICD-10-CM | POA: Diagnosis not present

## 2022-12-19 DIAGNOSIS — I700 Atherosclerosis of aorta: Secondary | ICD-10-CM | POA: Diagnosis not present

## 2022-12-19 DIAGNOSIS — M48062 Spinal stenosis, lumbar region with neurogenic claudication: Secondary | ICD-10-CM | POA: Diagnosis present

## 2022-12-19 DIAGNOSIS — Z8673 Personal history of transient ischemic attack (TIA), and cerebral infarction without residual deficits: Secondary | ICD-10-CM

## 2022-12-19 DIAGNOSIS — Z1152 Encounter for screening for COVID-19: Secondary | ICD-10-CM | POA: Diagnosis not present

## 2022-12-19 DIAGNOSIS — N390 Urinary tract infection, site not specified: Principal | ICD-10-CM | POA: Diagnosis present

## 2022-12-19 DIAGNOSIS — N3090 Cystitis, unspecified without hematuria: Secondary | ICD-10-CM | POA: Diagnosis not present

## 2022-12-19 DIAGNOSIS — E785 Hyperlipidemia, unspecified: Secondary | ICD-10-CM | POA: Diagnosis present

## 2022-12-19 DIAGNOSIS — Z9842 Cataract extraction status, left eye: Secondary | ICD-10-CM

## 2022-12-19 DIAGNOSIS — Z885 Allergy status to narcotic agent status: Secondary | ICD-10-CM

## 2022-12-19 DIAGNOSIS — Z888 Allergy status to other drugs, medicaments and biological substances status: Secondary | ICD-10-CM

## 2022-12-19 DIAGNOSIS — Z79899 Other long term (current) drug therapy: Secondary | ICD-10-CM

## 2022-12-19 DIAGNOSIS — Z8249 Family history of ischemic heart disease and other diseases of the circulatory system: Secondary | ICD-10-CM

## 2022-12-19 DIAGNOSIS — B9689 Other specified bacterial agents as the cause of diseases classified elsewhere: Secondary | ICD-10-CM | POA: Diagnosis present

## 2022-12-19 DIAGNOSIS — E1142 Type 2 diabetes mellitus with diabetic polyneuropathy: Secondary | ICD-10-CM | POA: Diagnosis present

## 2022-12-19 DIAGNOSIS — M47814 Spondylosis without myelopathy or radiculopathy, thoracic region: Secondary | ICD-10-CM | POA: Diagnosis not present

## 2022-12-19 DIAGNOSIS — Z91048 Other nonmedicinal substance allergy status: Secondary | ICD-10-CM

## 2022-12-19 DIAGNOSIS — Z9841 Cataract extraction status, right eye: Secondary | ICD-10-CM

## 2022-12-19 DIAGNOSIS — E1122 Type 2 diabetes mellitus with diabetic chronic kidney disease: Secondary | ICD-10-CM | POA: Diagnosis not present

## 2022-12-19 DIAGNOSIS — S199XXA Unspecified injury of neck, initial encounter: Secondary | ICD-10-CM | POA: Diagnosis not present

## 2022-12-19 DIAGNOSIS — N399 Disorder of urinary system, unspecified: Secondary | ICD-10-CM | POA: Diagnosis not present

## 2022-12-19 DIAGNOSIS — W19XXXA Unspecified fall, initial encounter: Secondary | ICD-10-CM

## 2022-12-19 LAB — URINALYSIS, W/ REFLEX TO CULTURE (INFECTION SUSPECTED)
Bilirubin Urine: NEGATIVE
Glucose, UA: >=500 mg/dL — AB
Ketones, ur: NEGATIVE mg/dL
Nitrite: NEGATIVE
Protein, ur: 30 mg/dL — AB
Specific Gravity, Urine: 1.007 (ref 1.005–1.030)
WBC, UA: >50 WBC/hpf (ref 0–5)
pH: 5 (ref 5.0–8.0)

## 2022-12-19 LAB — CBC WITH DIFFERENTIAL/PLATELET
Abs Immature Granulocytes: 0.05 10*3/uL (ref 0.00–0.07)
Basophils Absolute: 0 10*3/uL (ref 0.0–0.1)
Basophils Relative: 0 %
Eosinophils Absolute: 0 10*3/uL (ref 0.0–0.5)
Eosinophils Relative: 0 %
HCT: 49.1 % — ABNORMAL HIGH (ref 36.0–46.0)
Hemoglobin: 16.2 g/dL — ABNORMAL HIGH (ref 12.0–15.0)
Immature Granulocytes: 1 %
Lymphocytes Relative: 6 %
Lymphs Abs: 0.7 10*3/uL (ref 0.7–4.0)
MCH: 32.3 pg (ref 26.0–34.0)
MCHC: 33 g/dL (ref 30.0–36.0)
MCV: 98 fL (ref 80.0–100.0)
Monocytes Absolute: 1.2 10*3/uL — ABNORMAL HIGH (ref 0.1–1.0)
Monocytes Relative: 11 %
Neutro Abs: 8.6 10*3/uL — ABNORMAL HIGH (ref 1.7–7.7)
Neutrophils Relative %: 82 %
Platelets: 152 10*3/uL (ref 150–400)
RBC: 5.01 MIL/uL (ref 3.87–5.11)
RDW: 13.4 % (ref 11.5–15.5)
WBC: 10.5 10*3/uL (ref 4.0–10.5)
nRBC: 0 % (ref 0.0–0.2)

## 2022-12-19 LAB — COMPREHENSIVE METABOLIC PANEL
ALT: 11 U/L (ref 0–44)
AST: 16 U/L (ref 15–41)
Albumin: 3.6 g/dL (ref 3.5–5.0)
Alkaline Phosphatase: 106 U/L (ref 38–126)
Anion gap: 9 (ref 5–15)
BUN: 25 mg/dL — ABNORMAL HIGH (ref 8–23)
CO2: 24 mmol/L (ref 22–32)
Calcium: 9.2 mg/dL (ref 8.9–10.3)
Chloride: 101 mmol/L (ref 98–111)
Creatinine, Ser: 1.61 mg/dL — ABNORMAL HIGH (ref 0.44–1.00)
GFR, Estimated: 30 mL/min — ABNORMAL LOW (ref >60)
Glucose, Bld: 136 mg/dL — ABNORMAL HIGH (ref 70–99)
Potassium: 4.2 mmol/L (ref 3.5–5.1)
Sodium: 134 mmol/L — ABNORMAL LOW (ref 135–145)
Total Bilirubin: 0.9 mg/dL (ref 0.3–1.2)
Total Protein: 7 g/dL (ref 6.5–8.1)

## 2022-12-19 LAB — RESP PANEL BY RT-PCR (RSV, FLU A&B, COVID)  RVPGX2
Influenza A by PCR: NEGATIVE
Influenza B by PCR: NEGATIVE
Resp Syncytial Virus by PCR: NEGATIVE
SARS Coronavirus 2 by RT PCR: NEGATIVE

## 2022-12-19 LAB — LACTIC ACID, PLASMA: Lactic Acid, Venous: 1.1 mmol/L (ref 0.5–1.9)

## 2022-12-19 MED ORDER — CIPROFLOXACIN IN D5W 200 MG/100ML IV SOLN
200.0000 mg | Freq: Once | INTRAVENOUS | Status: DC
  Filled 2022-12-19: qty 100

## 2022-12-19 MED ORDER — AMLODIPINE BESYLATE 5 MG PO TABS
2.5000 mg | ORAL_TABLET | Freq: Every day | ORAL | Status: DC
  Administered 2022-12-20 – 2022-12-21 (×2): 2.5 mg via ORAL
  Filled 2022-12-19 (×2): qty 1

## 2022-12-19 MED ORDER — ACETAMINOPHEN 325 MG PO TABS
650.0000 mg | ORAL_TABLET | Freq: Once | ORAL | Status: AC
  Administered 2022-12-19: 650 mg via ORAL
  Filled 2022-12-19: qty 2

## 2022-12-19 MED ORDER — SODIUM CHLORIDE 0.9 % IV BOLUS
1000.0000 mL | Freq: Once | INTRAVENOUS | Status: AC
  Administered 2022-12-19: 1000 mL via INTRAVENOUS

## 2022-12-19 MED ORDER — SENNOSIDES-DOCUSATE SODIUM 8.6-50 MG PO TABS: 1.0000 | ORAL_TABLET | Freq: Every evening | ORAL | Status: DC | PRN

## 2022-12-19 MED ORDER — GABAPENTIN 300 MG PO CAPS
300.0000 mg | ORAL_CAPSULE | Freq: Two times a day (BID) | ORAL | Status: DC
  Administered 2022-12-20 – 2022-12-22 (×6): 300 mg via ORAL
  Filled 2022-12-19 (×6): qty 1

## 2022-12-19 MED ORDER — ATORVASTATIN CALCIUM 40 MG PO TABS
40.0000 mg | ORAL_TABLET | Freq: Every day | ORAL | Status: DC
  Administered 2022-12-20 – 2022-12-21 (×3): 40 mg via ORAL
  Filled 2022-12-19 (×3): qty 1

## 2022-12-19 MED ORDER — CITALOPRAM HYDROBROMIDE 20 MG PO TABS
10.0000 mg | ORAL_TABLET | Freq: Every day | ORAL | Status: DC
  Administered 2022-12-20 – 2022-12-22 (×3): 10 mg via ORAL
  Filled 2022-12-19 (×3): qty 1

## 2022-12-19 MED ORDER — ACETAMINOPHEN 650 MG RE SUPP: 650.0000 mg | Freq: Four times a day (QID) | RECTAL | Status: DC | PRN

## 2022-12-19 MED ORDER — ALBUTEROL SULFATE (2.5 MG/3ML) 0.083% IN NEBU: 2.5000 mg | INHALATION_SOLUTION | RESPIRATORY_TRACT | Status: DC | PRN

## 2022-12-19 MED ORDER — CIPROFLOXACIN IN D5W 200 MG/100ML IV SOLN
200.0000 mg | Freq: Two times a day (BID) | INTRAVENOUS | Status: DC
  Administered 2022-12-19 – 2022-12-21 (×4): 200 mg via INTRAVENOUS
  Filled 2022-12-19 (×5): qty 100

## 2022-12-19 MED ORDER — APIXABAN 2.5 MG PO TABS
2.5000 mg | ORAL_TABLET | Freq: Two times a day (BID) | ORAL | Status: DC
  Administered 2022-12-20 – 2022-12-22 (×6): 2.5 mg via ORAL
  Filled 2022-12-19 (×6): qty 1

## 2022-12-19 MED ORDER — ACETAMINOPHEN 325 MG PO TABS: 650.0000 mg | ORAL_TABLET | Freq: Four times a day (QID) | ORAL | Status: DC | PRN

## 2022-12-19 NOTE — ED Notes (Signed)
Pt transported with this RN to CT

## 2022-12-19 NOTE — Progress Notes (Signed)
Orthopedic Tech Progress Note Patient Details:  Penny Miller 1931/10/11 [id number]  Level 2 trauma   Patient ID: Penny Miller, female   DOB: [birth year], 89 y.o.   MRN: [id number]  [name] 12/19/2022, 10:35 PM

## 2022-12-19 NOTE — H&P (Signed)
History and Physical    Penny Miller ZOX:[id number] DOB: [birth year] DOA: 12/19/2022  PCP: Etta Grandchild, MD   Patient coming from: Home   Chief Complaint: Dysuria, generalized weakness   HPI: Penny Miller is a pleasant 89 y.o. female with medical history significant for sick sinus syndrome with pacemaker, hypertension, type 2 diabetes mellitus, history of CVA, and PAF on Eliquis who presents with dysuria and generalized weakness.  Patient reports several days of dysuria and urinary urgency and frequency.  She has also developed worsening generalized weakness which has progressed to the point where she has been unable to get up on her own for the past 2 days.  Family helped her to the commode this evening, but she slid off and onto the floor due to weakness.  She believes she may have hit the left side of her head on the ground but denies losing consciousness.  She denies any recent chest pain or palpitations.  She reports chills for the past 2 days and believes she has had a fever.  ED Course: Upon arrival to the ED, patient is found to be afebrile and saturating low 90s on room air with normal respiratory rate, normal heart rate, and stable blood pressure.  EKG demonstrates atrial fibrillation.  Chest x-ray is negative for acute cardiopulmonary disease.  No acute findings noted on head CT or cervical spine CT.  Labs are most notable for creatinine 1.61, hemoglobin 16.2, normal WBC, and normal lactic acid.  Urine culture was collected in the ED and the patient was given a liter of normal saline, acetaminophen, and ciprofloxacin.  Review of Systems:  All other systems reviewed and apart from HPI, are negative.  Past Medical History:  Diagnosis Date   Allergic rhinitis, cause unspecified    Anxiety state, unspecified    Arthritis    Colon polyp    Diaphragmatic hernia without mention of obstruction or gangrene    Diverticulosis of colon (without mention of hemorrhage)    Female  stress incontinence    HTN (hypertension)    Insomnia, unspecified    Interstitial cystitis    surgery 02/01/2013   Irritable bowel syndrome    Memory loss    Other and unspecified hyperlipidemia    Other premature beats    Pacemaker 2024   Polyneuropathy in diabetes(357.2)    Postmenopausal bleeding    Proteinuria    Rectal ulcer    Skin cancer    Stroke (HCC) 2018   Type II or unspecified type diabetes mellitus with neurological manifestations, not stated as uncontrolled(250.60)    Unspecified essential hypertension    Viral warts, unspecified     Past Surgical History:  Procedure Laterality Date   ABDOMINAL HYSTERECTOMY  1977   Dr.Hambright   APPENDECTOMY  1965   CATARACT EXTRACTION, BILATERAL  02/10/2006   Dr.Groat    COLONOSCOPY  07/17/2004   polypectomy   CYSTOSCOPY  1987   Dr.Bradley   LOOP RECORDER INSERTION N/A 07/31/2016   Procedure: Loop Recorder Insertion;  Surgeon: Marinus Maw, MD;  Location: [facility];  Service: Cardiovascular;  Laterality: N/A;   LOOP RECORDER REMOVAL N/A 05/15/2022   Procedure: LOOP RECORDER REMOVAL;  Surgeon: Marinus Maw, MD;  Location: [facility];  Service: Cardiovascular;  Laterality: N/A;   PACEMAKER IMPLANT N/A 05/15/2022   Procedure: PACEMAKER IMPLANT;  Surgeon: Marinus Maw, MD;  Location: [facility];  Service: Cardiovascular;  Laterality: N/A;   TEE WITHOUT  CARDIOVERSION N/A 07/30/2016   Procedure: TRANSESOPHAGEAL ECHOCARDIOGRAM (TEE);  Surgeon: Wendall Stade, MD;  Location: [facility] ENDOSCOPY;  Service: Cardiovascular;  Laterality: N/A;   TRIGGER FINGER RELEASE  1990   Dr.Carter   TRIGGER FINGER RELEASE  05/13/16   urinary bladder      surgery 02/01/2013 to stretch bladder stem    Social History:   reports that she quit smoking about 11 years ago. Her smoking use included cigarettes. She started smoking about 73 years ago. She has a 15.5 pack-year smoking history. She has never used smokeless tobacco. She  reports that she does not drink alcohol and does not use drugs.  Allergies  Allergen Reactions   Adhesive [Tape] Rash and Other (See Comments)    Tears the skin also   Ceclor [Cefaclor] Other (See Comments)    Unknown reaction; not recalled   Codeine Nausea And Vomiting   Jardiance [Empagliflozin] Diarrhea   Lopid [Gemfibrozil] Other (See Comments)    Unknown reaction; not recalled   Metformin And Related Diarrhea   Metoprolol Nausea And Vomiting   Morphine And Codeine Other (See Comments)    Passed out   Niacin And Related Other (See Comments)    Unknown reaction   Relafen [Nabumetone] Nausea And Vomiting   Septra [Sulfamethoxazole-Trimethoprim] Nausea And Vomiting   Tetracyclines & Related Nausea And Vomiting    Family History  Problem Relation Age of Onset   Pancreatic cancer Mother 53   Heart attack Father    Bladder Cancer Father    Hypertension Sister    Diabetes Sister    COPD Sister    Hypertension Son    Diabetes Brother    Heart disease Brother    Hypertension Brother    Diabetes Brother    Heart disease Brother 62       Myocardial Infarction    Heart attack Brother      Prior to Admission medications   Medication Sig Start Date End Date Taking? Authorizing Provider  ACCU-CHEK AVIVA PLUS test strip USE TO TEST BLOOD SUGAR TWICE DAILY. 09/11/18   Reed, [name] L, DO  acetaminophen (TYLENOL) 500 MG tablet Take 500 mg by mouth every 6 (six) hours as needed (pain).    [provider]  amLODipine (NORVASC) 2.5 MG tablet Take 2.5 mg by mouth at bedtime. 09/06/21   [provider]  apixaban (ELIQUIS) 2.5 MG TABS tablet TAKE 1 TABLET TWICE A DAY 10/16/22   Rollene Rotunda, MD  atorvastatin (LIPITOR) 40 MG tablet TAKE 1 TABLET AT BEDTIME 09/30/22   Etta Grandchild, MD  citalopram (CELEXA) 10 MG tablet TAKE 1 TABLET DAILY 08/15/22   Etta Grandchild, MD  dapagliflozin propanediol (FARXIGA) 10 MG TABS tablet TAKE 1 TABLET DAILY BEFORE BREAKFAST 10/28/22    Etta Grandchild, MD  famotidine (PEPCID) 20 MG tablet Take one tablet once to twice a day 02/05/22   Armbruster, Willaim Rayas, MD  ferrous sulfate 325 (65 FE) MG EC tablet Take 325 mg by mouth daily with breakfast. With Vitamin C    [provider]  gabapentin (NEURONTIN) 300 MG capsule TAKE 1 CAPSULE THREE TIMES A DAY 07/16/22   Etta Grandchild, MD  Lancets (ACCU-CHEK MULTICLIX) lancets Use to test blood sugar twice daily. E11.29 07/04/17   Reed, [name] L, DO  levocetirizine (XYZAL) 5 MG tablet TAKE ONE-HALF (1/2) TABLET (2.5 MG) EVERY OTHER DAY 10/30/22   Etta Grandchild, MD  Plecanatide (TRULANCE) 3 MG TABS Take 1 tablet (3  mg total) by mouth daily. 09/05/22   Etta Grandchild, MD  Vitamin D, Ergocalciferol, (DRISDOL) 1.25 MG (50000 UNIT) CAPS capsule TAKE 1 CAPSULE EVERY 7 DAYS 12/17/22   Etta Grandchild, MD    Physical Exam: Vitals:   12/19/22 2135 12/19/22 2213 12/19/22 2215 12/19/22 2232  BP:  (!) 157/67 130/79 125/68  Pulse:  69 64 66  Resp:  14 18 17   Temp:    99 F (37.2 C)  TempSrc:    Oral  SpO2:  99% 99% 97%  Weight: 59.9 kg     Height: 5\' 2"  (1.575 m)       Constitutional: NAD, calm  Eyes: PERTLA, lids and conjunctivae normal ENMT: Mucous membranes are moist. Posterior pharynx clear of any exudate or lesions.   Neck: supple, no masses  Respiratory: no wheezing, no crackles. No accessory muscle use.  Cardiovascular: S1 & S2 heard, regular rate and rhythm. No extremity edema.   Abdomen: No distension, no tenderness, soft. Bowel sounds active.  Musculoskeletal: no clubbing / cyanosis. No joint deformity upper and lower extremities.   Skin: no significant rashes, lesions, ulcers. Warm, dry, well-perfused. Neurologic: CN 2-12 grossly intact. Sensation to light touch intact. Strength 5/5 in all 4 limbs. Alert and oriented.  Psychiatric: Pleasant. Cooperative.    Labs and Imaging on Admission: I have personally reviewed following labs and imaging studies  CBC: Recent Labs   Lab 12/19/22 2122  WBC 10.5  NEUTROABS 8.6*  HGB 16.2*  HCT 49.1*  MCV 98.0  PLT 152   Basic Metabolic Panel: Recent Labs  Lab 12/19/22 2122  NA 134*  K 4.2  CL 101  CO2 24  GLUCOSE 136*  BUN 25*  CREATININE 1.61*  CALCIUM 9.2   GFR: Estimated Creatinine Clearance: 18.7 mL/min (A) (by C-G formula based on SCr of 1.61 mg/dL (H)). Liver Function Tests: Recent Labs  Lab 12/19/22 2122  AST 16  ALT 11  ALKPHOS 106  BILITOT 0.9  PROT 7.0  ALBUMIN 3.6   No results for input(s): "LIPASE", "AMYLASE" in the last 168 hours. No results for input(s): "AMMONIA" in the last 168 hours. Coagulation Profile: No results for input(s): "INR", "PROTIME" in the last 168 hours. Cardiac Enzymes: No results for input(s): "CKTOTAL", "CKMB", "CKMBINDEX", "TROPONINI" in the last 168 hours. BNP (last 3 results) Recent Labs    02/20/22 1150 03/04/22 0958  PROBNP 1,012.0* 863.0*   HbA1C: No results for input(s): "HGBA1C" in the last 72 hours. CBG: No results for input(s): "GLUCAP" in the last 168 hours. Lipid Profile: No results for input(s): "CHOL", "HDL", "LDLCALC", "TRIG", "CHOLHDL", "LDLDIRECT" in the last 72 hours. Thyroid Function Tests: No results for input(s): "TSH", "T4TOTAL", "FREET4", "T3FREE", "THYROIDAB" in the last 72 hours. Anemia Panel: No results for input(s): "VITAMINB12", "FOLATE", "FERRITIN", "TIBC", "IRON", "RETICCTPCT" in the last 72 hours. Urine analysis:    Component Value Date/Time   COLORURINE YELLOW 12/19/2022 2203   APPEARANCEUR CLOUDY (A) 12/19/2022 2203   APPEARANCEUR Cloudy (A) 12/17/2013 0906   LABSPEC 1.007 12/19/2022 2203   PHURINE 5.0 12/19/2022 2203   GLUCOSEU >=500 (A) 12/19/2022 2203   GLUCOSEU NEGATIVE 08/08/2021 1627   HGBUR SMALL (A) 12/19/2022 2203   BILIRUBINUR NEGATIVE 12/19/2022 2203   BILIRUBINUR neg 08/19/2022 1603   BILIRUBINUR Negative 12/17/2013 0906   KETONESUR NEGATIVE 12/19/2022 2203   PROTEINUR 30 (A) 12/19/2022 2203    UROBILINOGEN negative (A) 08/19/2022 1603   UROBILINOGEN 0.2 08/08/2021 1627   NITRITE NEGATIVE 12/19/2022 2203  LEUKOCYTESUR LARGE (A) 12/19/2022 2203   Sepsis Labs: @LABRCNTIP (procalcitonin:4,lacticidven:4) ) Recent Results (from the past 240 hour(s))  Resp panel by RT-PCR (RSV, Flu A&B, Covid) Urine, Clean Catch     Status: None   Collection Time: 12/19/22 10:03 PM   Specimen: Urine, Clean Catch; Nasal Swab  Result Value Ref Range Status   SARS Coronavirus 2 by RT PCR NEGATIVE NEGATIVE Final   Influenza A by PCR NEGATIVE NEGATIVE Final   Influenza B by PCR NEGATIVE NEGATIVE Final    Comment: (NOTE) The Xpert Xpress SARS-CoV-2/FLU/RSV plus assay is intended as an aid in the diagnosis of influenza from Nasopharyngeal swab specimens and should not be used as a sole basis for treatment. Nasal washings and aspirates are unacceptable for Xpert Xpress SARS-CoV-2/FLU/RSV testing.  Fact Sheet for Patients: BloggerCourse.com  Fact Sheet for Healthcare Providers: SeriousBroker.it  This test is not yet approved or cleared by the [place] FDA and has been authorized for detection and/or diagnosis of SARS-CoV-2 by FDA under an Emergency Use Authorization (EUA). This EUA will remain in effect (meaning this test can be used) for the duration of the COVID-19 declaration under Section 564(b)(1) of the Act, 21 U.S.C. section 360bbb-3(b)(1), unless the authorization is terminated or revoked.     Resp Syncytial Virus by PCR NEGATIVE NEGATIVE Final    Comment: (NOTE) Fact Sheet for Patients: BloggerCourse.com  Fact Sheet for Healthcare Providers: SeriousBroker.it  This test is not yet approved or cleared by the [place] FDA and has been authorized for detection and/or diagnosis of SARS-CoV-2 by FDA under an Emergency Use Authorization (EUA). This EUA will remain in effect  (meaning this test can be used) for the duration of the COVID-19 declaration under Section 564(b)(1) of the Act, 21 U.S.C. section 360bbb-3(b)(1), unless the authorization is terminated or revoked.  Performed at [facility], [address] [place], [place] [address]      Radiological Exams on Admission: CT Head Wo Contrast  Result Date: 12/19/2022 CLINICAL DATA:  Fall, trauma EXAM: CT HEAD WITHOUT CONTRAST CT CERVICAL SPINE WITHOUT CONTRAST TECHNIQUE: Multidetector CT imaging of the head and cervical spine was performed following the standard protocol without intravenous contrast. Multiplanar CT image reconstructions of the cervical spine were also generated. RADIATION DOSE REDUCTION: This exam was performed according to the departmental dose-optimization program which includes automated exposure control, adjustment of the mA and/or kV according to patient size and/or use of iterative reconstruction technique. COMPARISON:  CT head dated 03/12/2022 FINDINGS: CT HEAD FINDINGS Brain: No evidence of acute infarction, hemorrhage, hydrocephalus, extra-axial collection or mass lesion/mass effect. 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 Subcortical white matter and periventricular small vessel ischemic changes. Vascular: Intracranial atherosclerosis. Skull: Normal. Negative for fracture or focal lesion. Sinuses/Orbits: The visualized paranasal sinuses are essentially clear. The mastoid air cells are unopacified. Other: None. CT CERVICAL SPINE FINDINGS Alignment: Normal cervical lordosis. Skull base and vertebrae: No acute fracture. No primary bone lesion or focal pathologic process. Soft tissues and spinal canal: No prevertebral fluid or swelling. No visible canal hematoma. Disc levels: Mild degenerative changes of the mid cervical spine. Spinal canal is patent. Upper chest: Visualized lung apices are clear. Other: Visualized thyroid is unremarkable. IMPRESSION: No acute intracranial abnormality.  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 Small vessel ischemic changes. No traumatic injury to the cervical spine. Mild degenerative changes. Electronically Signed   By: Charline Bills M.D.   On: 12/19/2022 22:18   CT Cervical Spine Wo Contrast  Result  Date: 12/19/2022 CLINICAL DATA:  Fall, trauma EXAM: CT HEAD WITHOUT CONTRAST CT CERVICAL SPINE WITHOUT CONTRAST TECHNIQUE: Multidetector CT imaging of the head and cervical spine was performed following the standard protocol without intravenous contrast. Multiplanar CT image reconstructions of the cervical spine were also generated. RADIATION DOSE REDUCTION: This exam was performed according to the departmental dose-optimization program which includes automated exposure control, adjustment of the mA and/or kV according to patient size and/or use of iterative reconstruction technique. COMPARISON:  CT head dated 03/12/2022 FINDINGS: CT HEAD FINDINGS Brain: No evidence of acute infarction, hemorrhage, hydrocephalus, extra-axial collection or mass lesion/mass effect. 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 Subcortical white matter and periventricular small vessel ischemic changes. Vascular: Intracranial atherosclerosis. Skull: Normal. Negative for fracture or focal lesion. Sinuses/Orbits: The visualized paranasal sinuses are essentially clear. The mastoid air cells are unopacified. Other: None. CT CERVICAL SPINE FINDINGS Alignment: Normal cervical lordosis. Skull base and vertebrae: No acute fracture. No primary bone lesion or focal pathologic process. Soft tissues and spinal canal: No prevertebral fluid or swelling. No visible canal hematoma. Disc levels: Mild degenerative changes of the mid cervical spine. Spinal canal is patent. Upper chest: Visualized lung apices are clear. Other: Visualized thyroid is unremarkable. IMPRESSION: No acute intracranial abnormality. 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 Small vessel ischemic changes. No traumatic  injury to the cervical spine. Mild degenerative changes. Electronically Signed   By: Charline Bills M.D.   On: 12/19/2022 22:18   DG Chest Port 1 View  Result Date: 12/19/2022 CLINICAL DATA:  Weakness recent fall, initial encounter EXAM: PORTABLE CHEST 1 VIEW COMPARISON:  09/05/2022 FINDINGS: Cardiac shadow is within normal limits. Pacing device is again seen. Aortic calcifications are noted. The lungs are clear bilaterally. No bony abnormality is noted. IMPRESSION: No acute abnormality noted. Electronically Signed   By: Alcide Clever M.D.   On: 12/19/2022 22:07    EKG: Independently reviewed. Atrial fibrillation, rate 77.   Assessment/Plan   1. UTI  - Not septic on admission  - Allergies reviewed with pharmacy and ciprofloxacin recommended  - Continue ciprofloxacin, follow cultures and clinical course    2. General weakness  - No focal deficit or acute findings on head CT  - Likely d/t acute UTI  - Check CK, TSH, mag, and phos, treat UTI, consult PT   3. PAF  - Continue Eliquis    4. Hypertension  - Continue Norvasc   5. Type II DM  - Check CBGs and use low-intensity SSI if needed   6. CKD 3B  - SCr is 1.61 on admission; baseline appears to be closer to 1.3  - Renally-dose medications, continue IVF hydration overnight, repeat chem panel in am   7. Hx of CVA  - Continue Lipitor and Eliquis    DVT prophylaxis: Eliquis  Code Status: DNR  Level of Care: Level of care: Med-Surg Family Communication: None present   Disposition Plan:  Patient is from: home  Anticipated d/c is to: TBD Anticipated d/c date is: 9/20 or 12/21/22  Patient currently: Pending PT eval  Consults called: None  Admission status: Observation     Briscoe Deutscher, MD [facility]  12/19/2022, 11:16 PM

## 2022-12-19 NOTE — ED Notes (Signed)
ED TO INPATIENT HANDOFF REPORT  ED Nurse Name and Phone #: Ines Bloomer [phone]  S Name/Age/Gender Penny Miller 89 y.o. female Room/Bed: 032C/032C  Code Status   Code Status: Limited: Do not attempt resuscitation (DNR) -DNR-LIMITED -Do Not Intubate/DNI   Home/SNF/Other Home Patient oriented to: self, place, time, and situation Is this baseline? Yes   Triage Complete: Triage complete  Chief Complaint UTI (urinary tract infection) [N39.0]  Triage Note Pt comes via [facility] from home after a fall from seated position (toilet). Pt has had increased weakness x 2 days.She also reports dysuria and urinary incontinence x 2 weeks, Pt is on Eliquis and reports that she did hit the left side of her head on toilet. Denies LOC. Pt sats are 88% RA; pt placed on 2L  O2     Allergies Allergies  Allergen Reactions   Adhesive [Tape] Rash and Other (See Comments)    Tears the skin also   Ceclor [Cefaclor] Other (See Comments)    Unknown reaction; not recalled   Codeine Nausea And Vomiting   Jardiance [Empagliflozin] Diarrhea   Lopid [Gemfibrozil] Other (See Comments)    Unknown reaction; not recalled   Metformin And Related Diarrhea   Metoprolol Nausea And Vomiting   Morphine And Codeine Other (See Comments)    Passed out   Niacin And Related Other (See Comments)    Unknown reaction   Relafen [Nabumetone] Nausea And Vomiting   Septra [Sulfamethoxazole-Trimethoprim] Nausea And Vomiting   Tetracyclines & Related Nausea And Vomiting    Level of Care/Admitting Diagnosis ED Disposition     ED Disposition  Admit   Condition  --   Comment  Hospital Area: [facility] [[id number]]  Level of Care: Med-Surg [16]  May place patient in observation at [facility] or [facility] if equivalent level of care is available:: Yes  Covid Evaluation: Asymptomatic - no recent exposure (last 10 days) testing not required  Diagnosis: UTI (urinary tract infection) [629528]  Admitting Physician:  Briscoe Deutscher [[id number]]  Attending Physician: Briscoe Deutscher [[id number]]          B Medical/Surgery History Past Medical History:  Diagnosis Date   Allergic rhinitis, cause unspecified    Anxiety state, unspecified    Arthritis    Colon polyp    Diaphragmatic hernia without mention of obstruction or gangrene    Diverticulosis of colon (without mention of hemorrhage)    Female stress incontinence    HTN (hypertension)    Insomnia, unspecified    Interstitial cystitis    surgery 02/01/2013   Irritable bowel syndrome    Memory loss    Other and unspecified hyperlipidemia    Other premature beats    Pacemaker 2024   Polyneuropathy in diabetes(357.2)    Postmenopausal bleeding    Proteinuria    Rectal ulcer    Skin cancer    Stroke (HCC) 2018   Type II or unspecified type diabetes mellitus with neurological manifestations, not stated as uncontrolled(250.60)    Unspecified essential hypertension    Viral warts, unspecified    Past Surgical History:  Procedure Laterality Date   ABDOMINAL HYSTERECTOMY  1977   Dr.Hambright   APPENDECTOMY  1965   CATARACT EXTRACTION, BILATERAL  02/10/2006   Dr.Groat    COLONOSCOPY  07/17/2004   polypectomy   CYSTOSCOPY  1987   Dr.Bradley   LOOP RECORDER INSERTION N/A 07/31/2016   Procedure: Loop Recorder Insertion;  Surgeon: Marinus Maw, MD;  Location:  [facility];  Service: Cardiovascular;  Laterality: N/A;   LOOP RECORDER REMOVAL N/A 05/15/2022   Procedure: LOOP RECORDER REMOVAL;  Surgeon: Marinus Maw, MD;  Location: [facility];  Service: Cardiovascular;  Laterality: N/A;   PACEMAKER IMPLANT N/A 05/15/2022   Procedure: PACEMAKER IMPLANT;  Surgeon: Marinus Maw, MD;  Location: [facility];  Service: Cardiovascular;  Laterality: N/A;   TEE WITHOUT CARDIOVERSION N/A 07/30/2016   Procedure: TRANSESOPHAGEAL ECHOCARDIOGRAM (TEE);  Surgeon: Wendall Stade, MD;  Location: [facility] ENDOSCOPY;  Service: Cardiovascular;   Laterality: N/A;   TRIGGER FINGER RELEASE  1990   Dr.Carter   TRIGGER FINGER RELEASE  05/13/16   urinary bladder      surgery 02/01/2013 to stretch bladder stem     A IV Location/Drains/Wounds Patient Lines/Drains/Airways Status     Active Line/Drains/Airways     Name Placement date Placement time Site Days   Peripheral IV 12/19/22 20 G Anterior;Left;Proximal Forearm 12/19/22  2120  Forearm  less than 1   Peripheral IV 12/19/22 20 G Anterior;Left Forearm 12/19/22  2050  Forearm  less than 1            Intake/Output Last 24 hours No intake or output data in the 24 hours ending 12/19/22 2336  Labs/Imaging Results for orders placed or performed during the hospital encounter of 12/19/22 (from the past 48 hour(s))  CBC with Differential     Status: Abnormal   Collection Time: 12/19/22  9:22 PM  Result Value Ref Range   WBC 10.5 4.0 - 10.5 K/uL   RBC 5.01 3.87 - 5.11 MIL/uL   Hemoglobin 16.2 (H) 12.0 - 15.0 g/dL   HCT 46.9 (H) 62.9 - 52.8 %   MCV 98.0 80.0 - 100.0 fL   MCH 32.3 26.0 - 34.0 pg   MCHC 33.0 30.0 - 36.0 g/dL   RDW 41.3 24.4 - 01.0 %   Platelets 152 150 - 400 K/uL   nRBC 0.0 0.0 - 0.2 %   Neutrophils Relative % 82 %   Neutro Abs 8.6 (H) 1.7 - 7.7 K/uL   Lymphocytes Relative 6 %   Lymphs Abs 0.7 0.7 - 4.0 K/uL   Monocytes Relative 11 %   Monocytes Absolute 1.2 (H) 0.1 - 1.0 K/uL   Eosinophils Relative 0 %   Eosinophils Absolute 0.0 0.0 - 0.5 K/uL   Basophils Relative 0 %   Basophils Absolute 0.0 0.0 - 0.1 K/uL   Immature Granulocytes 1 %   Abs Immature Granulocytes 0.05 0.00 - 0.07 K/uL    Comment: Performed at [facility], [address], [place], [place] [address]  Comprehensive metabolic panel     Status: Abnormal   Collection Time: 12/19/22  9:22 PM  Result Value Ref Range   Sodium 134 (L) 135 - 145 mmol/L   Potassium 4.2 3.5 - 5.1 mmol/L   Chloride 101 98 - 111 mmol/L   CO2 24 22 - 32 mmol/L   Glucose, Bld 136 (H) 70 - 99 mg/dL     Comment: Glucose reference range applies only to samples taken after fasting for at least 8 hours.   BUN 25 (H) 8 - 23 mg/dL   Creatinine, Ser 6.64 (H) 0.44 - 1.00 mg/dL   Calcium 9.2 8.9 - 40.3 mg/dL   Total Protein 7.0 6.5 - 8.1 g/dL   Albumin 3.6 3.5 - 5.0 g/dL   AST 16 15 - 41 U/L   ALT 11 0 -  44 U/L   Alkaline Phosphatase 106 38 - 126 U/L   Total Bilirubin 0.9 0.3 - 1.2 mg/dL   GFR, Estimated 30 (L) >60 mL/min    Comment: (NOTE) Calculated using the CKD-EPI Creatinine Equation (2021)    Anion gap 9 5 - 15    Comment: Performed at [facility], [address], [place], [place] [address]  Lactic acid, plasma     Status: None   Collection Time: 12/19/22  9:22 PM  Result Value Ref Range   Lactic Acid, Venous 1.1 0.5 - 1.9 mmol/L    Comment: Performed at [facility], [address], [place], [place] [address]  Urinalysis, w/ Reflex to Culture (Infection Suspected) -Urine, Clean Catch     Status: Abnormal   Collection Time: 12/19/22 10:03 PM  Result Value Ref Range   Specimen Source URINE, CLEAN CATCH    Color, Urine YELLOW YELLOW   APPearance CLOUDY (A) CLEAR   Specific Gravity, Urine 1.007 1.005 - 1.030   pH 5.0 5.0 - 8.0   Glucose, UA >=500 (A) NEGATIVE mg/dL   Hgb urine dipstick SMALL (A) NEGATIVE   Bilirubin Urine NEGATIVE NEGATIVE   Ketones, ur NEGATIVE NEGATIVE mg/dL   Protein, ur 30 (A) NEGATIVE mg/dL   Nitrite NEGATIVE NEGATIVE   Leukocytes,Ua LARGE (A) NEGATIVE   RBC / HPF 11-20 0 - 5 RBC/hpf   WBC, UA >50 0 - 5 WBC/hpf    Comment:        Reflex urine culture not performed if WBC <=10, OR if Squamous epithelial cells >5. If Squamous epithelial cells >5 suggest recollection.    Bacteria, UA RARE (A) NONE SEEN   Squamous Epithelial / HPF 0-5 0 - 5 /HPF   WBC Clumps PRESENT    Mucus PRESENT     Comment: Performed at [facility], [address], [place], [place] [address]  Resp panel by RT-PCR (RSV, Flu A&B, Covid) Urine, Clean Catch      Status: None   Collection Time: 12/19/22 10:03 PM   Specimen: Urine, Clean Catch; Nasal Swab  Result Value Ref Range   SARS Coronavirus 2 by RT PCR NEGATIVE NEGATIVE   Influenza A by PCR NEGATIVE NEGATIVE   Influenza B by PCR NEGATIVE NEGATIVE    Comment: (NOTE) The Xpert Xpress SARS-CoV-2/FLU/RSV plus assay is intended as an aid in the diagnosis of influenza from Nasopharyngeal swab specimens and should not be used as a sole basis for treatment. Nasal washings and aspirates are unacceptable for Xpert Xpress SARS-CoV-2/FLU/RSV testing.  Fact Sheet for Patients: BloggerCourse.com  Fact Sheet for Healthcare Providers: SeriousBroker.it  This test is not yet approved or cleared by the [place] FDA and has been authorized for detection and/or diagnosis of SARS-CoV-2 by FDA under an Emergency Use Authorization (EUA). This EUA will remain in effect (meaning this test can be used) for the duration of the COVID-19 declaration under Section 564(b)(1) of the Act, 21 U.S.C. section 360bbb-3(b)(1), unless the authorization is terminated or revoked.     Resp Syncytial Virus by PCR NEGATIVE NEGATIVE    Comment: (NOTE) Fact Sheet for Patients: BloggerCourse.com  Fact Sheet for Healthcare Providers: SeriousBroker.it  This test is not yet approved or cleared by the [place] FDA and has been authorized for detection and/or diagnosis of SARS-CoV-2 by FDA under an Emergency Use Authorization (EUA). This EUA will remain in effect (meaning this test can be used) for the duration of the COVID-19 declaration  under Section 564(b)(1) of the Act, 21 U.S.C. section 360bbb-3(b)(1), unless the authorization is terminated or revoked.  Performed at [facility], [address], [place], [place] [address]    CT Head Wo Contrast  Result Date: 12/19/2022 CLINICAL DATA:  Fall, trauma EXAM:  CT HEAD WITHOUT CONTRAST CT CERVICAL SPINE WITHOUT CONTRAST TECHNIQUE: Multidetector CT imaging of the head and cervical spine was performed following the standard protocol without intravenous contrast. Multiplanar CT image reconstructions of the cervical spine were also generated. RADIATION DOSE REDUCTION: This exam was performed according to the departmental dose-optimization program which includes automated exposure control, adjustment of the mA and/or kV according to patient size and/or use of iterative reconstruction technique. COMPARISON:  CT head dated 03/12/2022 FINDINGS: CT HEAD FINDINGS Brain: No evidence of acute infarction, hemorrhage, hydrocephalus, extra-axial collection or mass lesion/mass effect. 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 Subcortical white matter and periventricular small vessel ischemic changes. Vascular: Intracranial atherosclerosis. Skull: Normal. Negative for fracture or focal lesion. Sinuses/Orbits: The visualized paranasal sinuses are essentially clear. The mastoid air cells are unopacified. Other: None. CT CERVICAL SPINE FINDINGS Alignment: Normal cervical lordosis. Skull base and vertebrae: No acute fracture. No primary bone lesion or focal pathologic process. Soft tissues and spinal canal: No prevertebral fluid or swelling. No visible canal hematoma. Disc levels: Mild degenerative changes of the mid cervical spine. Spinal canal is patent. Upper chest: Visualized lung apices are clear. Other: Visualized thyroid is unremarkable. IMPRESSION: No acute intracranial abnormality. 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 Small vessel ischemic changes. No traumatic injury to the cervical spine. Mild degenerative changes. Electronically Signed   By: Charline Bills M.D.   On: 12/19/2022 22:18   CT Cervical Spine Wo Contrast  Result Date: 12/19/2022 CLINICAL DATA:  Fall, trauma EXAM: CT HEAD WITHOUT CONTRAST CT CERVICAL SPINE WITHOUT CONTRAST TECHNIQUE: Multidetector CT  imaging of the head and cervical spine was performed following the standard protocol without intravenous contrast. Multiplanar CT image reconstructions of the cervical spine were also generated. RADIATION DOSE REDUCTION: This exam was performed according to the departmental dose-optimization program which includes automated exposure control, adjustment of the mA and/or kV according to patient size and/or use of iterative reconstruction technique. COMPARISON:  CT head dated 03/12/2022 FINDINGS: CT HEAD FINDINGS Brain: No evidence of acute infarction, hemorrhage, hydrocephalus, extra-axial collection or mass lesion/mass effect. 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 Subcortical white matter and periventricular small vessel ischemic changes. Vascular: Intracranial atherosclerosis. Skull: Normal. Negative for fracture or focal lesion. Sinuses/Orbits: The visualized paranasal sinuses are essentially clear. The mastoid air cells are unopacified. Other: None. CT CERVICAL SPINE FINDINGS Alignment: Normal cervical lordosis. Skull base and vertebrae: No acute fracture. No primary bone lesion or focal pathologic process. Soft tissues and spinal canal: No prevertebral fluid or swelling. No visible canal hematoma. Disc levels: Mild degenerative changes of the mid cervical spine. Spinal canal is patent. Upper chest: Visualized lung apices are clear. Other: Visualized thyroid is unremarkable. IMPRESSION: No acute intracranial abnormality. 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 Small vessel ischemic changes. No traumatic injury to the cervical spine. Mild degenerative changes. Electronically Signed   By: Charline Bills M.D.   On: 12/19/2022 22:18   DG Chest Port 1 View  Result Date: 12/19/2022 CLINICAL DATA:  Weakness recent fall, initial encounter EXAM: PORTABLE CHEST 1 VIEW COMPARISON:  09/05/2022 FINDINGS: Cardiac shadow is within normal limits. Pacing device is again seen. Aortic calcifications are noted. The  lungs are clear bilaterally. No bony  abnormality is noted. IMPRESSION: No acute abnormality noted. Electronically Signed   By: Alcide Clever M.D.   On: 12/19/2022 22:07    Pending Labs Unresulted Labs (From admission, onward)     Start     Ordered   12/20/22 0500  CK  Tomorrow morning,   R        12/19/22 2316   12/20/22 0500  Basic metabolic panel  Daily,   R      12/19/22 2316   12/20/22 0500  CBC  Daily,   R      12/19/22 2316   12/20/22 0500  Magnesium  Tomorrow morning,   R        12/19/22 2316   12/20/22 0500  Phosphorus  Tomorrow morning,   R        12/19/22 2316   12/20/22 0500  TSH  Tomorrow morning,   R        12/19/22 2316   12/19/22 2203  Urine Culture  Once,   R        12/19/22 2203            Vitals/Pain Today's Vitals   12/19/22 2215 12/19/22 2232 12/19/22 2245 12/19/22 2315  BP: 130/79 125/68 109/84 104/65  Pulse: 64 66 66 63  Resp: 18 17 18 16   Temp:  99 F (37.2 C)    TempSrc:  Oral    SpO2: 99% 97% 99% 97%  Weight:      Height:      PainSc:  3       Isolation Precautions No active isolations  Medications Medications  sodium chloride 0.9 % bolus 1,000 mL (has no administration in time range)  ciprofloxacin (CIPRO) IVPB 200 mg (has no administration in time range)  amLODipine (NORVASC) tablet 2.5 mg (has no administration in time range)  atorvastatin (LIPITOR) tablet 40 mg (has no administration in time range)  citalopram (CELEXA) tablet 10 mg (has no administration in time range)  apixaban (ELIQUIS) tablet 2.5 mg (has no administration in time range)  gabapentin (NEURONTIN) capsule 300 mg (has no administration in time range)  acetaminophen (TYLENOL) tablet 650 mg (has no administration in time range)    Or  acetaminophen (TYLENOL) suppository 650 mg (has no administration in time range)  senna-docusate (Senokot-S) tablet 1 tablet (has no administration in time range)  albuterol (PROVENTIL) (2.5 MG/3ML) 0.083% nebulizer solution 2.5 mg (has  no administration in time range)  acetaminophen (TYLENOL) tablet 650 mg (650 mg Oral Given 12/19/22 2212)    Mobility walks     Focused Assessments See chart   R Recommendations: See Admitting Provider Note  Report given to:   Additional Notes: pt is alert and oriented x 4; ambulatory with minimal assist at baseline; uses bedpan due to weakness and fall risk

## 2022-12-19 NOTE — ED Provider Notes (Signed)
[place] EMERGENCY DEPARTMENT AT [facility] Provider Note   CSN: [id number] Arrival date & time: 12/19/22  2105     History  Chief Complaint  Patient presents with   Penny Miller Penny Miller is a 89 y.o. female.  With history of persistent A-fib on Eliquis, type 2 diabetes, anxiety, hypertension, IBS, interstitial cystitis, previous stroke presenting for evaluation of a fall.  She states she has been feeling progressively weaker over the past 2 days.  Believes she has a UTI as she is complaining of some dysuria, frequency, urgency, and suprapubic abdominal pain.  She was helped to the commode in her bathroom by her son earlier today.  When she was taking her pants off, she leaned forward and began to slide off of the commode.  She believes she may have hit her head on the bathtub.  She did not lose consciousness.  She reports compliance with her Eliquis and has not missed any doses.  She denies any headaches, vision changes, numbness, weakness, tingling, chest pain, abdominal pain.  She says she has had some shortness of breath over the past week.  Patient believes that she was down on the ground for approximately 15 minutes before EMS arrived.   Fall Associated symptoms include shortness of breath.       Home Medications Prior to Admission medications   Medication Sig Start Date End Date Taking? Authorizing Provider  ACCU-CHEK AVIVA PLUS test strip USE TO TEST BLOOD SUGAR TWICE DAILY. 09/11/18   Reed, [name] L, DO  acetaminophen (TYLENOL) 500 MG tablet Take 500 mg by mouth every 6 (six) hours as needed (pain).    [provider]  amLODipine (NORVASC) 2.5 MG tablet Take 2.5 mg by mouth at bedtime. 09/06/21   [provider]  apixaban (ELIQUIS) 2.5 MG TABS tablet TAKE 1 TABLET TWICE A DAY 10/16/22   Rollene Rotunda, MD  atorvastatin (LIPITOR) 40 MG tablet TAKE 1 TABLET AT BEDTIME 09/30/22   Etta Grandchild, MD  citalopram (CELEXA) 10 MG tablet TAKE 1 TABLET  DAILY 08/15/22   Etta Grandchild, MD  dapagliflozin propanediol (FARXIGA) 10 MG TABS tablet TAKE 1 TABLET DAILY BEFORE BREAKFAST 10/28/22   Etta Grandchild, MD  famotidine (PEPCID) 20 MG tablet Take one tablet once to twice a day 02/05/22   Armbruster, Willaim Rayas, MD  ferrous sulfate 325 (65 FE) MG EC tablet Take 325 mg by mouth daily with breakfast. With Vitamin C    [provider]  gabapentin (NEURONTIN) 300 MG capsule TAKE 1 CAPSULE THREE TIMES A DAY 07/16/22   Etta Grandchild, MD  Lancets (ACCU-CHEK MULTICLIX) lancets Use to test blood sugar twice daily. E11.29 07/04/17   Reed, [name] L, DO  levocetirizine (XYZAL) 5 MG tablet TAKE ONE-HALF (1/2) TABLET (2.5 MG) EVERY OTHER DAY 10/30/22   Etta Grandchild, MD  Plecanatide (TRULANCE) 3 MG TABS Take 1 tablet (3 mg total) by mouth daily. 09/05/22   Etta Grandchild, MD  Vitamin D, Ergocalciferol, (DRISDOL) 1.25 MG (50000 UNIT) CAPS capsule TAKE 1 CAPSULE EVERY 7 DAYS 12/17/22   Etta Grandchild, MD      Allergies    Adhesive [tape], Ceclor [cefaclor], Codeine, Jardiance [empagliflozin], Lopid [gemfibrozil], Metformin and related, Metoprolol, Morphine and codeine, Niacin and related, Relafen [nabumetone], Septra [sulfamethoxazole-trimethoprim], and Tetracyclines & related    Review of Systems   Review of Systems  Respiratory:  Positive for shortness of breath.   Genitourinary:  Positive  for dysuria, frequency and urgency.  All other systems reviewed and are negative.   Physical Exam Updated Vital Signs BP 125/68   Pulse 66   Temp 99 F (37.2 C) (Oral)   Resp 17   Ht 5\' 2"  (1.575 m)   Wt 59.9 kg   SpO2 97%   BMI 24.15 kg/m  Physical Exam Vitals and nursing note reviewed.  Constitutional:      General: She is not in acute distress.    Appearance: She is well-developed.     Comments: Resting in comfortably bed.  Chronically ill-appearing  HENT:     Head: Normocephalic and atraumatic.  Eyes:     Extraocular Movements: Extraocular  movements intact.     Conjunctiva/sclera: Conjunctivae normal.     Pupils: Pupils are equal, round, and reactive to light.     Comments: No traumatic hyphema  Cardiovascular:     Rate and Rhythm: Normal rate and regular rhythm.     Heart sounds: No murmur heard. Pulmonary:     Effort: Pulmonary effort is normal. No respiratory distress.     Breath sounds: Normal breath sounds. No wheezing, rhonchi or rales.  Abdominal:     Palpations: Abdomen is soft.     Tenderness: There is no abdominal tenderness.  Musculoskeletal:        General: No swelling.     Cervical back: Neck supple.     Comments: No midline C, T or L-spine TTP, step-offs, deformities, crepitus  Skin:    General: Skin is warm and dry.     Capillary Refill: Capillary refill takes less than 2 seconds.  Neurological:     General: No focal deficit present.     Mental Status: She is alert and oriented to person, place, and time.  Psychiatric:        Mood and Affect: Mood normal.        Behavior: Behavior normal.     ED Results / Procedures / Treatments   Labs (all labs ordered are listed, but only abnormal results are displayed) Labs Reviewed  CBC WITH DIFFERENTIAL/PLATELET - Abnormal; Notable for the following components:      Result Value   Hemoglobin 16.2 (*)    HCT 49.1 (*)    Neutro Abs 8.6 (*)    Monocytes Absolute 1.2 (*)    All other components within normal limits  COMPREHENSIVE METABOLIC PANEL - Abnormal; Notable for the following components:   Sodium 134 (*)    Glucose, Bld 136 (*)    BUN 25 (*)    Creatinine, Ser 1.61 (*)    GFR, Estimated 30 (*)    All other components within normal limits  URINALYSIS, W/ REFLEX TO CULTURE (INFECTION SUSPECTED) - Abnormal; Notable for the following components:   APPearance CLOUDY (*)    Glucose, UA >=500 (*)    Hgb urine dipstick SMALL (*)    Protein, ur 30 (*)    Leukocytes,Ua LARGE (*)    Bacteria, UA RARE (*)    All other components within normal limits   RESP PANEL BY RT-PCR (RSV, FLU A&B, COVID)  RVPGX2  URINE CULTURE  LACTIC ACID, PLASMA  LACTIC ACID, PLASMA    EKG None  Radiology CT Head Wo Contrast  Result Date: 12/19/2022 CLINICAL DATA:  Fall, trauma EXAM: CT HEAD WITHOUT CONTRAST CT CERVICAL SPINE WITHOUT CONTRAST TECHNIQUE: Multidetector CT imaging of the head and cervical spine was performed following the standard protocol without intravenous contrast. Multiplanar CT image reconstructions  of the cervical spine were also generated. RADIATION DOSE REDUCTION: This exam was performed according to the departmental dose-optimization program which includes automated exposure control, adjustment of the mA and/or kV according to patient size and/or use of iterative reconstruction technique. COMPARISON:  CT head dated 03/12/2022 FINDINGS: CT HEAD FINDINGS Brain: No evidence of acute infarction, hemorrhage, hydrocephalus, extra-axial collection or mass lesion/mass effect. 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 Subcortical white matter and periventricular small vessel ischemic changes. Vascular: Intracranial atherosclerosis. Skull: Normal. Negative for fracture or focal lesion. Sinuses/Orbits: The visualized paranasal sinuses are essentially clear. The mastoid air cells are unopacified. Other: None. CT CERVICAL SPINE FINDINGS Alignment: Normal cervical lordosis. Skull base and vertebrae: No acute fracture. No primary bone lesion or focal pathologic process. Soft tissues and spinal canal: No prevertebral fluid or swelling. No visible canal hematoma. Disc levels: Mild degenerative changes of the mid cervical spine. Spinal canal is patent. Upper chest: Visualized lung apices are clear. Other: Visualized thyroid is unremarkable. IMPRESSION: No acute intracranial abnormality. 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 Small vessel ischemic changes. No traumatic injury to the cervical spine. Mild degenerative changes. Electronically Signed   By: Charline Bills M.D.   On: 12/19/2022 22:18   CT Cervical Spine Wo Contrast  Result Date: 12/19/2022 CLINICAL DATA:  Fall, trauma EXAM: CT HEAD WITHOUT CONTRAST CT CERVICAL SPINE WITHOUT CONTRAST TECHNIQUE: Multidetector CT imaging of the head and cervical spine was performed following the standard protocol without intravenous contrast. Multiplanar CT image reconstructions of the cervical spine were also generated. RADIATION DOSE REDUCTION: This exam was performed according to the departmental dose-optimization program which includes automated exposure control, adjustment of the mA and/or kV according to patient size and/or use of iterative reconstruction technique. COMPARISON:  CT head dated 03/12/2022 FINDINGS: CT HEAD FINDINGS Brain: No evidence of acute infarction, hemorrhage, hydrocephalus, extra-axial collection or mass lesion/mass effect. 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 Subcortical white matter and periventricular small vessel ischemic changes. Vascular: Intracranial atherosclerosis. Skull: Normal. Negative for fracture or focal lesion. Sinuses/Orbits: The visualized paranasal sinuses are essentially clear. The mastoid air cells are unopacified. Other: None. CT CERVICAL SPINE FINDINGS Alignment: Normal cervical lordosis. Skull base and vertebrae: No acute fracture. No primary bone lesion or focal pathologic process. Soft tissues and spinal canal: No prevertebral fluid or swelling. No visible canal hematoma. Disc levels: Mild degenerative changes of the mid cervical spine. Spinal canal is patent. Upper chest: Visualized lung apices are clear. Other: Visualized thyroid is unremarkable. IMPRESSION: No acute intracranial abnormality. 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 Small vessel ischemic changes. No traumatic injury to the cervical spine. Mild degenerative changes. Electronically Signed   By: Charline Bills M.D.   On: 12/19/2022 22:18   DG Chest Port 1 View  Result Date:  12/19/2022 CLINICAL DATA:  Weakness recent fall, initial encounter EXAM: PORTABLE CHEST 1 VIEW COMPARISON:  09/05/2022 FINDINGS: Cardiac shadow is within normal limits. Pacing device is again seen. Aortic calcifications are noted. The lungs are clear bilaterally. No bony abnormality is noted. IMPRESSION: No acute abnormality noted. Electronically Signed   By: Alcide Clever M.D.   On: 12/19/2022 22:07    Procedures Procedures    Medications Ordered in ED Medications  sodium chloride 0.9 % bolus 1,000 mL (has no administration in time range)  ciprofloxacin (CIPRO) IVPB 200 mg (has no administration in time range)  acetaminophen (TYLENOL) tablet 650 mg (650 mg Oral Given 12/19/22 2212)    ED Course/ Medical Decision Making/  A&P Clinical Course as of 12/19/22 2309  Thu Dec 19, 2022  2307 Spoke with hospitalist Dr. Antionette Char who will admit [AS]    Clinical Course User Index [AS] Penny Miller, Edsel Petrin, PA-C                                 Medical Decision Making Amount and/or Complexity of Data Reviewed Labs: ordered. Radiology: ordered.  Risk OTC drugs.   This patient presents to the ED for concern of fall, weakness, this involves an extensive number of treatment options, and is a complaint that carries with it a high risk of complications and morbidity. The differential diagnosis of weakness includes but is not limited to neurologic causes (GBS, myasthenia gravis, CVA, MS, ALS, transverse myelitis, spinal cord injury, CVA, botulism, ) and other causes: ACS, Arrhythmia, syncope, orthostatic hypotension, sepsis, hypoglycemia, electrolyte disturbance, hypothyroidism, respiratory failure, symptomatic anemia, dehydration, heat injury, polypharmacy, malignancy.   My initial workup includes labs, imaging, EKG  Additional history obtained from: Nursing notes from this visit. EMS provides a portion of the history  I ordered, reviewed and interpreted labs which include: CBC, CMP, lactate,  urinalysis, respiratory panel.  Hyponatremia of 134, BUN elevated to 25, creatinine 1.61, large leukocytes, greater than 50 white blood cells, rare bacteria  I ordered imaging studies including CT head, C-spine, chest x-ray I independently visualized and interpreted imaging which showed no acute abnormalities I agree with the radiologist interpretation  Cardiac Monitoring:  The patient was maintained on a cardiac monitor.  I personally viewed and interpreted the cardiac monitored which showed an underlying rhythm of: A-fib  Consultations Obtained:  I requested consultation with the hospitalist,  and discussed lab and imaging findings as well as pertinent plan - they recommend: Admission  Afebrile, hypoxic on room air to 91%, otherwise hemodynamically stable.  89 year old female presenting for evaluation of a fall and progressive weakness over the past 2 days.  She denies any prodromal symptoms to the fall or loss of consciousness.  States she just slid down her commode.  She reports dysuria, frequency and urgency as well.  Has interstitial cystitis but states this feels worse than normal.  She has some shortness of breath and a cough over the past week as well.  No adventitious breath sounds on exam.  Chest x-ray negative.  Lab workup consistent for urinary tract infection.  This is likely the source of her weakness and fall.  She has a reaction to cefaclor which caused respiratory distress.  Will initiate IV Cipro and fluid resuscitation for her symptoms.  Recommend admission to hospitalist for continued management given her weakness and fall as well as hypoxia.  Patient and family are in agreement with this plan  Patient's case discussed with Dr. Clarice Pole who agrees with plan to discharge with follow-up.   Note: Portions of this report may have been transcribed using voice recognition software. Every effort was made to ensure accuracy; however, inadvertent computerized transcription errors may  still be present.        Final Clinical Impression(s) / ED Diagnoses Final diagnoses:  Cystitis  Weakness  Fall, initial encounter    Rx / DC Orders ED Discharge Orders     None         Mora Bellman 12/19/22 2309    Arby Barrette, MD 12/21/22 [phone]

## 2022-12-19 NOTE — ED Notes (Signed)
Family at bedside. 

## 2022-12-19 NOTE — ED Notes (Signed)
Returned to room from CT.

## 2022-12-19 NOTE — ED Triage Notes (Signed)
Pt comes via [facility] from home after a fall from seated position (toilet). Pt has had increased weakness x 2 days.She also reports dysuria and urinary incontinence x 2 weeks, Pt is on Eliquis and reports that she did hit the left side of her head on toilet. Denies LOC. Pt sats are 88% RA; pt placed on 2L  O2

## 2022-12-19 NOTE — ED Notes (Signed)
X-ray at bedside

## 2022-12-19 NOTE — ED Notes (Signed)
[employer] pacemaker interrogated °

## 2022-12-20 ENCOUNTER — Inpatient Hospital Stay (HOSPITAL_COMMUNITY): Payer: Medicare Other

## 2022-12-20 ENCOUNTER — Encounter (HOSPITAL_COMMUNITY): Payer: Self-pay | Admitting: Family Medicine

## 2022-12-20 DIAGNOSIS — Z8673 Personal history of transient ischemic attack (TIA), and cerebral infarction without residual deficits: Secondary | ICD-10-CM | POA: Diagnosis not present

## 2022-12-20 DIAGNOSIS — N1832 Chronic kidney disease, stage 3b: Secondary | ICD-10-CM | POA: Diagnosis present

## 2022-12-20 DIAGNOSIS — R109 Unspecified abdominal pain: Secondary | ICD-10-CM | POA: Diagnosis not present

## 2022-12-20 DIAGNOSIS — M47814 Spondylosis without myelopathy or radiculopathy, thoracic region: Secondary | ICD-10-CM | POA: Diagnosis not present

## 2022-12-20 DIAGNOSIS — Z825 Family history of asthma and other chronic lower respiratory diseases: Secondary | ICD-10-CM | POA: Diagnosis not present

## 2022-12-20 DIAGNOSIS — Z1152 Encounter for screening for COVID-19: Secondary | ICD-10-CM | POA: Diagnosis not present

## 2022-12-20 DIAGNOSIS — M47816 Spondylosis without myelopathy or radiculopathy, lumbar region: Secondary | ICD-10-CM | POA: Diagnosis present

## 2022-12-20 DIAGNOSIS — M5137 Other intervertebral disc degeneration, lumbosacral region: Secondary | ICD-10-CM | POA: Diagnosis not present

## 2022-12-20 DIAGNOSIS — Z8249 Family history of ischemic heart disease and other diseases of the circulatory system: Secondary | ICD-10-CM | POA: Diagnosis not present

## 2022-12-20 DIAGNOSIS — I4819 Other persistent atrial fibrillation: Secondary | ICD-10-CM | POA: Diagnosis present

## 2022-12-20 DIAGNOSIS — N390 Urinary tract infection, site not specified: Secondary | ICD-10-CM | POA: Diagnosis present

## 2022-12-20 DIAGNOSIS — Z7901 Long term (current) use of anticoagulants: Secondary | ICD-10-CM | POA: Diagnosis not present

## 2022-12-20 DIAGNOSIS — N3000 Acute cystitis without hematuria: Secondary | ICD-10-CM | POA: Diagnosis not present

## 2022-12-20 DIAGNOSIS — B9689 Other specified bacterial agents as the cause of diseases classified elsewhere: Secondary | ICD-10-CM | POA: Diagnosis present

## 2022-12-20 DIAGNOSIS — Z833 Family history of diabetes mellitus: Secondary | ICD-10-CM | POA: Diagnosis not present

## 2022-12-20 DIAGNOSIS — M48062 Spinal stenosis, lumbar region with neurogenic claudication: Secondary | ICD-10-CM | POA: Diagnosis present

## 2022-12-20 DIAGNOSIS — M4854XA Collapsed vertebra, not elsewhere classified, thoracic region, initial encounter for fracture: Secondary | ICD-10-CM | POA: Diagnosis present

## 2022-12-20 DIAGNOSIS — Z800 Family history of malignant neoplasm of digestive organs: Secondary | ICD-10-CM | POA: Diagnosis not present

## 2022-12-20 DIAGNOSIS — Z950 Presence of cardiac pacemaker: Secondary | ICD-10-CM | POA: Diagnosis not present

## 2022-12-20 DIAGNOSIS — I129 Hypertensive chronic kidney disease with stage 1 through stage 4 chronic kidney disease, or unspecified chronic kidney disease: Secondary | ICD-10-CM | POA: Diagnosis present

## 2022-12-20 DIAGNOSIS — M5125 Other intervertebral disc displacement, thoracolumbar region: Secondary | ICD-10-CM | POA: Diagnosis not present

## 2022-12-20 DIAGNOSIS — Z66 Do not resuscitate: Secondary | ICD-10-CM | POA: Diagnosis present

## 2022-12-20 DIAGNOSIS — R531 Weakness: Secondary | ICD-10-CM | POA: Diagnosis not present

## 2022-12-20 DIAGNOSIS — Z8052 Family history of malignant neoplasm of bladder: Secondary | ICD-10-CM | POA: Diagnosis not present

## 2022-12-20 DIAGNOSIS — E785 Hyperlipidemia, unspecified: Secondary | ICD-10-CM | POA: Diagnosis present

## 2022-12-20 DIAGNOSIS — F411 Generalized anxiety disorder: Secondary | ICD-10-CM | POA: Diagnosis present

## 2022-12-20 DIAGNOSIS — Z85828 Personal history of other malignant neoplasm of skin: Secondary | ICD-10-CM | POA: Diagnosis not present

## 2022-12-20 DIAGNOSIS — Z87891 Personal history of nicotine dependence: Secondary | ICD-10-CM | POA: Diagnosis not present

## 2022-12-20 DIAGNOSIS — E1122 Type 2 diabetes mellitus with diabetic chronic kidney disease: Secondary | ICD-10-CM | POA: Diagnosis present

## 2022-12-20 DIAGNOSIS — E1142 Type 2 diabetes mellitus with diabetic polyneuropathy: Secondary | ICD-10-CM | POA: Diagnosis present

## 2022-12-20 LAB — CBC
HCT: 42.5 % (ref 36.0–46.0)
Hemoglobin: 13.5 g/dL (ref 12.0–15.0)
MCH: 30.8 pg (ref 26.0–34.0)
MCHC: 31.8 g/dL (ref 30.0–36.0)
MCV: 96.8 fL (ref 80.0–100.0)
Platelets: 131 10*3/uL — ABNORMAL LOW (ref 150–400)
RBC: 4.39 MIL/uL (ref 3.87–5.11)
RDW: 13.3 % (ref 11.5–15.5)
WBC: 9.1 10*3/uL (ref 4.0–10.5)
nRBC: 0 % (ref 0.0–0.2)

## 2022-12-20 LAB — BASIC METABOLIC PANEL
Anion gap: 12 (ref 5–15)
BUN: 28 mg/dL — ABNORMAL HIGH (ref 8–23)
CO2: 23 mmol/L (ref 22–32)
Calcium: 8.6 mg/dL — ABNORMAL LOW (ref 8.9–10.3)
Chloride: 100 mmol/L (ref 98–111)
Creatinine, Ser: 1.77 mg/dL — ABNORMAL HIGH (ref 0.44–1.00)
GFR, Estimated: 27 mL/min — ABNORMAL LOW (ref >60)
Glucose, Bld: 107 mg/dL — ABNORMAL HIGH (ref 70–99)
Potassium: 4.3 mmol/L (ref 3.5–5.1)
Sodium: 135 mmol/L (ref 135–145)

## 2022-12-20 LAB — TSH: TSH: 1.392 u[IU]/mL (ref 0.350–4.500)

## 2022-12-20 LAB — PHOSPHORUS: Phosphorus: 4.4 mg/dL (ref 2.5–4.6)

## 2022-12-20 LAB — CK: Total CK: 48 U/L (ref 38–234)

## 2022-12-20 LAB — MAGNESIUM: Magnesium: 2.1 mg/dL (ref 1.7–2.4)

## 2022-12-20 MED ORDER — LORAZEPAM 2 MG/ML IJ SOLN
1.0000 mg | Freq: Once | INTRAMUSCULAR | Status: AC | PRN
  Administered 2022-12-20: 1 mg via INTRAVENOUS
  Filled 2022-12-20: qty 1

## 2022-12-20 MED ORDER — INFLUENZA VAC A&B SURF ANT ADJ 0.5 ML IM SUSY
0.5000 mL | PREFILLED_SYRINGE | INTRAMUSCULAR | Status: DC
  Filled 2022-12-20: qty 0.5

## 2022-12-20 NOTE — TOC Initial Note (Addendum)
Transition of Care [facility]) - Initial/Assessment Note    Patient Details  Name: Penny Miller MRN: [id number] Date of Birth: 06-May-1931  [facility] CM/SW Contact:    Lockie Pares, RN Phone Number: 12/20/2022, 12:11 PM  Clinical Narrative:                 Patient from home with son and DIL. Has had [facility] and [facility] in the past. Spoke with son and DIL. They currently have palliative care with [facility]. They are interested in [facility] but do not want to make a decision at this time.  Has DME at home from [facility].  1620 Called and spoke to [place], patients son. He chose [facility] [address] for [facility]  TOC will follow for needs, recommendations, and transitions of care.  Expected Discharge Plan: Home w Home Health Services Barriers to Discharge: Continued Medical Work up   Patient Goals and CMS Choice            Expected Discharge Plan and Services       Living arrangements for the past 2 months: Single Family Home                                      Prior Living Arrangements/Services Living arrangements for the past 2 months: Single Family Home Lives with:: Adult Children Patient language and need for interpreter reviewed:: Yes        Need for Family Participation in Patient Care: Yes (Comment) Care giver support system in place?: Yes (comment)   Criminal Activity/Legal Involvement Pertinent to Current Situation/Hospitalization: No - Comment as needed  Activities of Daily Living Home Assistive Devices/Equipment: Walker (specify type) ADL Screening (condition at time of admission) Patient's cognitive ability adequate to safely complete daily activities?: Yes Is the patient deaf or have difficulty hearing?: No Does the patient have difficulty seeing, even when wearing glasses/contacts?: No Does the patient have difficulty concentrating, remembering, or making decisions?: No Patient able to express need for assistance with ADLs?: Yes Does the patient  have difficulty dressing or bathing?: No Independently performs ADLs?: No Communication: Independent Dressing (OT): Independent Grooming: Independent Feeding: Independent Bathing: Independent Toileting: Needs assistance Is this a change from baseline?: Change from baseline, expected to last <3 days In/Out Bed: Needs assistance Is this a change from baseline?: Change from baseline, expected to last <3 days Walks in Home: Needs assistance Is this a change from baseline?: Change from baseline, expected to last <3 days Does the patient have difficulty walking or climbing stairs?: Yes Weakness of Legs: Both Weakness of Arms/Hands: None  Permission Sought/Granted                  Emotional Assessment       Orientation: : Oriented to Self Alcohol / Substance Use: Not Applicable Psych Involvement: No (comment)  Admission diagnosis:  UTI (urinary tract infection) [N39.0] Weakness [R53.1] Cystitis [N30.90] Fall, initial encounter [W19.XXXA] Patient Active Problem List   Diagnosis Date Noted   UTI (urinary tract infection) 12/19/2022   Generalized abdominal tenderness without rebound tenderness 09/05/2022   Pacemaker 08/22/2022   Pneumonia of both lungs due to infectious organism 04/05/2022   Subacute cough 04/04/2022   B12 deficiency 04/04/2022   Asthma, chronic, unspecified asthma severity, with acute exacerbation 03/12/2022   Anemia due to acquired thiamine deficiency 01/30/2022   Anemia due to zinc deficiency 01/30/2022   Thrombocytopenia (HCC)  01/23/2022   Sinus node dysfunction (HCC) 01/17/2022   PVC (premature ventricular contraction) 01/17/2022   Moderate persistent asthma without complication 09/25/2021   Constipation 07/25/2021   Pulmonary nodule 07/25/2021   Compression fracture of T12 vertebra (HCC) 07/23/2021   OAB (overactive bladder) 12/26/2020   Chronic cerebral ischemia 11/20/2020   Frequent falls 11/10/2020   Chronic renal disease, stage 4, severely  decreased glomerular filtration rate (GFR) between 15-29 mL/min/1.73 square meter (HCC) 07/22/2020   Seasonal allergic rhinitis due to pollen 02/03/2020   Intrinsic eczema 02/03/2020   Incomplete emptying of bladder 10/01/2019   Acquired hypothyroidism 08/15/2019   Iron deficiency anemia due to chronic blood loss 08/15/2019   Spinal stenosis, lumbar region with neurogenic claudication 05/26/2018   Vertigo of central origin 07/21/2017   Overweight (BMI 25.0-29.9) 11/07/2016   Paroxysmal atrial fibrillation (HCC) 09/03/2016   Diabetic polyneuropathy associated with type 2 diabetes mellitus (HCC) 09/03/2016   Aneurysm of vertebral artery (HCC) 08/12/2016   History of CVA (cerebrovascular accident) 07/29/2016   CKD stage 3b, GFR 30-44 ml/min (HCC) 05/08/2016   DM (diabetes mellitus), type 2 with renal complications (HCC) 06/22/2014   Bladder infection, chronic 01/04/2013   Insomnia, unspecified    Essential hypertension    Chronic interstitial cystitis    PCP:  Etta Grandchild, MD Pharmacy:   [facility] [place], [place] - [address] [facility] [address] [address] [place] [address] Phone: [phone] Fax: [phone]  [facility] - [place], [place] - [address] [place] [address] Phone: [phone] Fax: [phone]  [facility] - [place], [place] - [address] [facility] [employer]. [address] [place] [address] Phone: [phone] Fax: [phone]  [facility] [address] [place] [address] Phone: [phone] Fax: [phone]     Social Determinants of Health (SDOH) Social History: SDOH Screenings   Food Insecurity: No Food Insecurity (12/20/2022)  Housing: Low Risk  (12/20/2022)  Transportation Needs: No Transportation Needs (12/20/2022)  Utilities: Not At Risk (12/20/2022)  Alcohol Screen: Low Risk  (11/05/2021)  Depression  (PHQ2-9): Low Risk  (12/17/2022)  Financial Resource Strain: Low Risk  (12/17/2022)  Physical Activity: Inactive (12/17/2022)  Social Connections: Moderately Integrated (12/17/2022)  Stress: No Stress Concern Present (12/17/2022)  Tobacco Use: Medium Risk (12/20/2022)  Health Literacy: Inadequate Health Literacy (12/17/2022)   SDOH Interventions:     Readmission Risk Interventions     No data to display

## 2022-12-20 NOTE — Progress Notes (Addendum)
PROGRESS NOTE    SUNNI DIFRANCO  NWG:[id number] DOB: 1932/01/31 DOA: 12/19/2022 PCP: Etta Grandchild, MD     Brief Narrative:   Penny Miller is a pleasant 89 y.o. female with medical history significant for sick sinus syndrome with pacemaker, hypertension, type 2 diabetes mellitus, history of CVA, and PAF on Eliquis who presents with dysuria and generalized weakness.   Patient reports several days of dysuria and urinary urgency and frequency.  She has also developed worsening generalized weakness which has progressed to the point where she has been unable to get up on her own for the past 2 days.  Family helped her to the commode this evening, but she slid off and onto the floor due to weakness.  She believes she may have hit the left side of her head on the ground but denies losing consciousness.  She denies any recent chest pain or palpitations.  She reports chills for the past 2 days and believes she has had a fever.   Assessment & Plan:   Principal Problem:   UTI (urinary tract infection) Active Problems:   DM (diabetes mellitus), type 2 with renal complications (HCC)   CKD stage 3b, GFR 30-44 ml/min (HCC)   History of CVA (cerebrovascular accident)   Paroxysmal atrial fibrillation (HCC)   Spinal stenosis, lumbar region with neurogenic claudication   Pacemaker   1. UTI  - Not septic on admission  - Allergies reviewed with pharmacy and ciprofloxacin recommended  - Continue ciprofloxacin, follow cultures and clinical course     2. General weakness  - No focal deficit or acute findings on head CT or ct c spine Does have history neurogenic claudication, compression fractures, also relatively recent problems with constipation - Likely d/t acute UTI  - PT advising home health - will check mri of lumbar and thoracic spine to r/o cord compression. Has pacemaker, however, so mri will need to confirm whether compatible. If not compatible would be reasonable to monitor symptoms while  we treat for uti   3. PAF  - Continue Eliquis     4. Hypertension  - Continue Norvasc    5. Type II DM  - Check CBGs and use low-intensity SSI if needed    6. CKD 3B  - SCr is 1.61 on admission; baseline appears to be closer to 1.3  - will monitor for now  7. Hx of CVA  - Continue Lipitor and Eliquis   8. Ruq pain Reports recent history of ruq pain precipitated by eating. None currently, normal lfts - will check ruq u/s   DVT prophylaxis: apixaban Code Status: dnr Family Communication: son updated @ bedside  Level of care: Med-Surg     Consultants:  none  Procedures: none  Antimicrobials:  cipro    Subjective: Reports still feeling a bit weak with some dysuria  Objective: Vitals:   12/20/22 0022 12/20/22 0024 12/20/22 0415 12/20/22 0753  BP:  (!) 101/59 (!) 104/51 (!) 142/59  Pulse:  63 (!) 59 62  Resp:  18 18 20   Temp:  97.9 F (36.6 C) 98 F (36.7 C) 98.2 F (36.8 C)  TempSrc:  Oral Oral Oral  SpO2:  96% 97% 96%  Weight: 66.8 kg  68.5 kg   Height: 5\' 7"  (1.702 m)       Intake/Output Summary (Last 24 hours) at 12/20/2022 1232 Last data filed at 12/20/2022 1129 Gross per 24 hour  Intake 1620 ml  Output 200 ml  Net 1420  ml   Filed Weights   12/19/22 2135 12/20/22 0022 12/20/22 0415  Weight: 59.9 kg 66.8 kg 68.5 kg    Examination:  General exam: Appears calm and comfortable  Respiratory system: Clear to auscultation. Respiratory effort normal. Cardiovascular system: S1 & S2 heard, RRR. No JVD, murmurs, rubs, gallops or clicks. No pedal edema. Gastrointestinal system: Abdomen is nondistended, soft and nontender. No organomegaly or masses felt. Normal bowel sounds heard. Central nervous system: Alert and oriented. No focal neurological deficits. No gross weakness lower extremities Extremities: warm Skin: No rashes, lesions or ulcers Psychiatry: Judgement and insight appear normal. Mood & affect appropriate.     Data Reviewed: I have  personally reviewed following labs and imaging studies  CBC: Recent Labs  Lab 12/19/22 2122 12/20/22 0629  WBC 10.5 9.1  NEUTROABS 8.6*  --   HGB 16.2* 13.5  HCT 49.1* 42.5  MCV 98.0 96.8  PLT 152 131*   Basic Metabolic Panel: Recent Labs  Lab 12/19/22 2122 12/20/22 0629  NA 134* 135  K 4.2 4.3  CL 101 100  CO2 24 23  GLUCOSE 136* 107*  BUN 25* 28*  CREATININE 1.61* 1.77*  CALCIUM 9.2 8.6*  MG  --  2.1  PHOS  --  4.4   GFR: Estimated Creatinine Clearance: 21 mL/min (A) (by C-G formula based on SCr of 1.77 mg/dL (H)). Liver Function Tests: Recent Labs  Lab 12/19/22 2122  AST 16  ALT 11  ALKPHOS 106  BILITOT 0.9  PROT 7.0  ALBUMIN 3.6   No results for input(s): "LIPASE", "AMYLASE" in the last 168 hours. No results for input(s): "AMMONIA" in the last 168 hours. Coagulation Profile: No results for input(s): "INR", "PROTIME" in the last 168 hours. Cardiac Enzymes: Recent Labs  Lab 12/20/22 0629  CKTOTAL 48   BNP (last 3 results) Recent Labs    02/20/22 1150 03/04/22 0958  PROBNP 1,012.0* 863.0*   HbA1C: No results for input(s): "HGBA1C" in the last 72 hours. CBG: No results for input(s): "GLUCAP" in the last 168 hours. Lipid Profile: No results for input(s): "CHOL", "HDL", "LDLCALC", "TRIG", "CHOLHDL", "LDLDIRECT" in the last 72 hours. Thyroid Function Tests: Recent Labs    12/20/22 0629  TSH 1.392   Anemia Panel: No results for input(s): "VITAMINB12", "FOLATE", "FERRITIN", "TIBC", "IRON", "RETICCTPCT" in the last 72 hours. Urine analysis:    Component Value Date/Time   COLORURINE YELLOW 12/19/2022 2203   APPEARANCEUR CLOUDY (A) 12/19/2022 2203   APPEARANCEUR Cloudy (A) 12/17/2013 0906   LABSPEC 1.007 12/19/2022 2203   PHURINE 5.0 12/19/2022 2203   GLUCOSEU >=500 (A) 12/19/2022 2203   GLUCOSEU NEGATIVE 08/08/2021 1627   HGBUR SMALL (A) 12/19/2022 2203   BILIRUBINUR NEGATIVE 12/19/2022 2203   BILIRUBINUR neg 08/19/2022 1603    BILIRUBINUR Negative 12/17/2013 0906   KETONESUR NEGATIVE 12/19/2022 2203   PROTEINUR 30 (A) 12/19/2022 2203   UROBILINOGEN negative (A) 08/19/2022 1603   UROBILINOGEN 0.2 08/08/2021 1627   NITRITE NEGATIVE 12/19/2022 2203   LEUKOCYTESUR LARGE (A) 12/19/2022 2203   Sepsis Labs: @LABRCNTIP (procalcitonin:4,lacticidven:4)  ) Recent Results (from the past 240 hour(s))  Resp panel by RT-PCR (RSV, Flu A&B, Covid) Urine, Clean Catch     Status: None   Collection Time: 12/19/22 10:03 PM   Specimen: Urine, Clean Catch; Nasal Swab  Result Value Ref Range Status   SARS Coronavirus 2 by RT PCR NEGATIVE NEGATIVE Final   Influenza A by PCR NEGATIVE NEGATIVE Final   Influenza B by PCR NEGATIVE  NEGATIVE Final    Comment: (NOTE) The Xpert Xpress SARS-CoV-2/FLU/RSV plus assay is intended as an aid in the diagnosis of influenza from Nasopharyngeal swab specimens and should not be used as a sole basis for treatment. Nasal washings and aspirates are unacceptable for Xpert Xpress SARS-CoV-2/FLU/RSV testing.  Fact Sheet for Patients: BloggerCourse.com  Fact Sheet for Healthcare Providers: SeriousBroker.it  This test is not yet approved or cleared by the [place] FDA and has been authorized for detection and/or diagnosis of SARS-CoV-2 by FDA under an Emergency Use Authorization (EUA). This EUA will remain in effect (meaning this test can be used) for the duration of the COVID-19 declaration under Section 564(b)(1) of the Act, 21 U.S.C. section 360bbb-3(b)(1), unless the authorization is terminated or revoked.     Resp Syncytial Virus by PCR NEGATIVE NEGATIVE Final    Comment: (NOTE) Fact Sheet for Patients: BloggerCourse.com  Fact Sheet for Healthcare Providers: SeriousBroker.it  This test is not yet approved or cleared by the [place] FDA and has been authorized for detection and/or  diagnosis of SARS-CoV-2 by FDA under an Emergency Use Authorization (EUA). This EUA will remain in effect (meaning this test can be used) for the duration of the COVID-19 declaration under Section 564(b)(1) of the Act, 21 U.S.C. section 360bbb-3(b)(1), unless the authorization is terminated or revoked.  Performed at [facility], [address], [place], [place] [address]          Radiology Studies: CT Head Wo Contrast  Result Date: 12/19/2022 CLINICAL DATA:  Fall, trauma EXAM: CT HEAD WITHOUT CONTRAST CT CERVICAL SPINE WITHOUT CONTRAST TECHNIQUE: Multidetector CT imaging of the head and cervical spine was performed following the standard protocol without intravenous contrast. Multiplanar CT image reconstructions of the cervical spine were also generated. RADIATION DOSE REDUCTION: This exam was performed according to the departmental dose-optimization program which includes automated exposure control, adjustment of the mA and/or kV according to patient size and/or use of iterative reconstruction technique. COMPARISON:  CT head dated 03/12/2022 FINDINGS: CT HEAD FINDINGS Brain: No evidence of acute infarction, hemorrhage, hydrocephalus, extra-axial collection or mass lesion/mass effect. 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 Subcortical white matter and periventricular small vessel ischemic changes. Vascular: Intracranial atherosclerosis. Skull: Normal. Negative for fracture or focal lesion. Sinuses/Orbits: The visualized paranasal sinuses are essentially clear. The mastoid air cells are unopacified. Other: None. CT CERVICAL SPINE FINDINGS Alignment: Normal cervical lordosis. Skull base and vertebrae: No acute fracture. No primary bone lesion or focal pathologic process. Soft tissues and spinal canal: No prevertebral fluid or swelling. No visible canal hematoma. Disc levels: Mild degenerative changes of the mid cervical spine. Spinal canal is patent. Upper chest: Visualized lung apices  are clear. Other: Visualized thyroid is unremarkable. IMPRESSION: No acute intracranial abnormality. 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 Small vessel ischemic changes. No traumatic injury to the cervical spine. Mild degenerative changes. Electronically Signed   By: Charline Bills M.D.   On: 12/19/2022 22:18   CT Cervical Spine Wo Contrast  Result Date: 12/19/2022 CLINICAL DATA:  Fall, trauma EXAM: CT HEAD WITHOUT CONTRAST CT CERVICAL SPINE WITHOUT CONTRAST TECHNIQUE: Multidetector CT imaging of the head and cervical spine was performed following the standard protocol without intravenous contrast. Multiplanar CT image reconstructions of the cervical spine were also generated. RADIATION DOSE REDUCTION: This exam was performed according to the departmental dose-optimization program which includes automated exposure control, adjustment of the mA and/or kV according to patient size and/or use of iterative reconstruction technique. COMPARISON:  CT head dated 03/12/2022 FINDINGS: CT HEAD FINDINGS Brain: No evidence of acute infarction, hemorrhage, hydrocephalus, extra-axial collection or mass lesion/mass effect. 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 Subcortical white matter and periventricular small vessel ischemic changes. Vascular: Intracranial atherosclerosis. Skull: Normal. Negative for fracture or focal lesion. Sinuses/Orbits: The visualized paranasal sinuses are essentially clear. The mastoid air cells are unopacified. Other: None. CT CERVICAL SPINE FINDINGS Alignment: Normal cervical lordosis. Skull base and vertebrae: No acute fracture. No primary bone lesion or focal pathologic process. Soft tissues and spinal canal: No prevertebral fluid or swelling. No visible canal hematoma. Disc levels: Mild degenerative changes of the mid cervical spine. Spinal canal is patent. Upper chest: Visualized lung apices are clear. Other: Visualized thyroid is unremarkable. IMPRESSION: No acute intracranial  abnormality. 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 Small vessel ischemic changes. No traumatic injury to the cervical spine. Mild degenerative changes. Electronically Signed   By: Charline Bills M.D.   On: 12/19/2022 22:18   DG Chest Port 1 View  Result Date: 12/19/2022 CLINICAL DATA:  Weakness recent fall, initial encounter EXAM: PORTABLE CHEST 1 VIEW COMPARISON:  09/05/2022 FINDINGS: Cardiac shadow is within normal limits. Pacing device is again seen. Aortic calcifications are noted. The lungs are clear bilaterally. No bony abnormality is noted. IMPRESSION: No acute abnormality noted. Electronically Signed   By: Alcide Clever M.D.   On: 12/19/2022 22:07        Scheduled Meds:  amLODipine  2.5 mg Oral QHS   apixaban  2.5 mg Oral BID   atorvastatin  40 mg Oral QHS   citalopram  10 mg Oral Daily   gabapentin  300 mg Oral BID   [START ON 12/21/2022] influenza vaccine adjuvanted  0.5 mL Intramuscular Tomorrow-1000   Continuous Infusions:  ciprofloxacin 200 mg (12/20/22 1117)     LOS: 0 days     Silvano Bilis, MD [facility]   If 7PM-7AM, please contact night-coverage [email or web address] Password [email or web address] 12/20/2022, 12:32 PM

## 2022-12-20 NOTE — ED Notes (Signed)
Pt's son, Margine Ufford, left contact # [phone] and dtr-in-law, Baruch Gouty # [phone]

## 2022-12-20 NOTE — Evaluation (Addendum)
Physical Therapy Evaluation Patient Details Name: Penny Miller MRN: [id number] DOB: Mar 31, 1932 Today's Date: 12/20/2022  History of Present Illness  89 yo female admitted 9/19 after fall off toilet with weakness and UTI. PMhx: HTN, CVA, AFib, T2DM, spinal stenosis, insomnia, IBS, skin CA, SSS s/p PPM, polyneuropathy  Clinical Impression  Pt pleasant and reports living at home with son and normally being able to walk without AD. Pt does some light meal prep and performs ADLs without assist. Pt oriented and demonstrates decreased strength, transfers and gait who will benefit from acute therapy to maximize mobility, safety and independence to decrease burden of care. Encouraged OOB daily for meals and up with assist for gait and toileting.  Pt would benefit from HHPT to maximize safety and return to PLOF.      If plan is discharge home, recommend the following: A little help with walking and/or transfers;A little help with bathing/dressing/bathroom;Direct supervision/assist for medications management;Assist for transportation;Assistance with cooking/housework   Can travel by private vehicle        Equipment Recommendations None recommended by PT  Recommendations for Other Services       Functional Status Assessment Patient has had a recent decline in their functional status and demonstrates the ability to make significant improvements in function in a reasonable and predictable amount of time.     Precautions / Restrictions Precautions Precautions: Fall      Mobility  Bed Mobility Overal bed mobility: Modified Independent             General bed mobility comments: increased time with use of rail    Transfers Overall transfer level: Needs assistance   Transfers: Sit to/from Stand Sit to Stand: Contact guard assist           General transfer comment: cues for hand placement and safety to rise from bed and lower to recliner    Ambulation/Gait Ambulation/Gait  assistance: Contact guard assist Gait Distance (Feet): 350 Feet Assistive device: Rolling walker (2 wheels) Gait Pattern/deviations: Step-through pattern, Decreased stride length   Gait velocity interpretation: 1.31 - 2.62 ft/sec, indicative of limited community ambulator   General Gait Details: cues for direction, posture, proximity to rW and Wellsite geologist     Tilt Bed    Modified Rankin (Stroke Patients Only)       Balance Overall balance assessment: Needs assistance   Sitting balance-Leahy Scale: Good     Standing balance support: Bilateral upper extremity supported, During functional activity, Reliant on assistive device for balance Standing balance-Leahy Scale: Poor Standing balance comment: Rw in standing                             Pertinent Vitals/Pain Pain Assessment Pain Assessment: No/denies pain    Home Living Family/patient expects to be discharged to:: Private residence Living Arrangements: Children;Other relatives Available Help at Discharge: Family;Available 24 hours/day Type of Home: House Home Access: Ramped entrance       Home Layout: One level Home Equipment: Agricultural consultant (2 wheels);Rollator (4 wheels);Shower seat;BSC/3in1;Wheelchair - manual;Grab bars - tub/shower Additional Comments: lives with son    Prior Function Prior Level of Function : Independent/Modified Independent             Mobility Comments: uses rollator at times, normally no AD ADLs Comments: MOD I for sponge bathing, dressing, limited meal prep  Extremity/Trunk Assessment   Upper Extremity Assessment Upper Extremity Assessment: Generalized weakness    Lower Extremity Assessment Lower Extremity Assessment: Generalized weakness    Cervical / Trunk Assessment Cervical / Trunk Assessment: Normal  Communication      Cognition Arousal: Alert Behavior During Therapy: WFL for tasks assessed/performed Overall  Cognitive Status: Within Functional Limits for tasks assessed                                          General Comments      Exercises     Assessment/Plan    PT Assessment Patient needs continued PT services  PT Problem List Decreased strength;Decreased mobility;Decreased activity tolerance;Decreased balance;Decreased knowledge of use of DME       PT Treatment Interventions DME instruction;Gait training;Functional mobility training;Therapeutic activities;Patient/family education;Balance training;Therapeutic exercise    PT Goals (Current goals can be found in the Care Plan section)  Acute Rehab PT Goals Patient Stated Goal: return home PT Goal Formulation: With patient Time For Goal Achievement: 01/03/23 Potential to Achieve Goals: Good    Frequency Min 1X/week     Co-evaluation               AM-PAC PT "6 Clicks" Mobility  Outcome Measure Help needed turning from your back to your side while in a flat bed without using bedrails?: None Help needed moving from lying on your back to sitting on the side of a flat bed without using bedrails?: A Little Help needed moving to and from a bed to a chair (including a wheelchair)?: A Little Help needed standing up from a chair using your arms (e.g., wheelchair or bedside chair)?: A Little Help needed to walk in hospital room?: A Little Help needed climbing 3-5 steps with a railing? : A Lot 6 Click Score: 18    End of Session Equipment Utilized During Treatment: Gait belt Activity Tolerance: Patient tolerated treatment well Patient left: in chair;with call bell/phone within reach;with chair alarm set Nurse Communication: Mobility status PT Visit Diagnosis: Other abnormalities of gait and mobility (R26.89);Muscle weakness (generalized) (M62.81);History of falling (Z91.81)    Time: 6644-0347 PT Time Calculation (min) (ACUTE ONLY): 19 min   Charges:   PT Evaluation $PT Eval Moderate Complexity: 1 Mod    PT General Charges $$ ACUTE PT VISIT: 1 Visit         Merryl Hacker, PT Acute Rehabilitation Services Office: [phone]   Brain Honeycutt B Nnamdi Dacus 12/20/2022, 10:06 AM

## 2022-12-20 NOTE — Plan of Care (Signed)
  Problem: Education: Goal: Knowledge of risk factors and measures for prevention of condition will improve Outcome: Progressing   Problem: Coping: Goal: Psychosocial and spiritual needs will be supported Outcome: Progressing   Problem: Respiratory: Goal: Will maintain a patent airway Outcome: Progressing   Problem: Education: Goal: Knowledge of cardiac device and self-care will improve Outcome: Progressing   Problem: Cardiac: Goal: Ability to achieve and maintain adequate cardiopulmonary perfusion will improve Outcome: Progressing

## 2022-12-21 DIAGNOSIS — N390 Urinary tract infection, site not specified: Secondary | ICD-10-CM | POA: Diagnosis not present

## 2022-12-21 LAB — CBC
HCT: 39.3 % (ref 36.0–46.0)
Hemoglobin: 13.2 g/dL (ref 12.0–15.0)
MCH: 32 pg (ref 26.0–34.0)
MCHC: 33.6 g/dL (ref 30.0–36.0)
MCV: 95.2 fL (ref 80.0–100.0)
Platelets: 126 10*3/uL — ABNORMAL LOW (ref 150–400)
RBC: 4.13 MIL/uL (ref 3.87–5.11)
RDW: 13.3 % (ref 11.5–15.5)
WBC: 9 10*3/uL (ref 4.0–10.5)
nRBC: 0 % (ref 0.0–0.2)

## 2022-12-21 LAB — BASIC METABOLIC PANEL
Anion gap: 7 (ref 5–15)
BUN: 36 mg/dL — ABNORMAL HIGH (ref 8–23)
CO2: 24 mmol/L (ref 22–32)
Calcium: 8.8 mg/dL — ABNORMAL LOW (ref 8.9–10.3)
Chloride: 101 mmol/L (ref 98–111)
Creatinine, Ser: 1.85 mg/dL — ABNORMAL HIGH (ref 0.44–1.00)
GFR, Estimated: 26 mL/min — ABNORMAL LOW (ref >60)
Glucose, Bld: 105 mg/dL — ABNORMAL HIGH (ref 70–99)
Potassium: 4.3 mmol/L (ref 3.5–5.1)
Sodium: 132 mmol/L — ABNORMAL LOW (ref 135–145)

## 2022-12-21 MED ORDER — CIPROFLOXACIN HCL 250 MG PO TABS
250.0000 mg | ORAL_TABLET | Freq: Two times a day (BID) | ORAL | Status: DC
  Administered 2022-12-21 – 2022-12-22 (×2): 250 mg via ORAL
  Filled 2022-12-21 (×2): qty 1

## 2022-12-21 MED ORDER — POLYETHYLENE GLYCOL 3350 17 G PO PACK
17.0000 g | PACK | Freq: Every day | ORAL | Status: DC
  Administered 2022-12-21 – 2022-12-22 (×2): 17 g via ORAL
  Filled 2022-12-21 (×2): qty 1

## 2022-12-21 NOTE — Plan of Care (Signed)
Patient sat in chair today multiple times for meals. She walked with assistance to the bathroom. Had a small bowel movement today. She is ready to return home tomorrow.

## 2022-12-21 NOTE — Plan of Care (Signed)
  Problem: Education: Goal: Knowledge of risk factors and measures for prevention of condition will improve Outcome: Progressing   Problem: Coping: Goal: Psychosocial and spiritual needs will be supported Outcome: Progressing   Problem: Respiratory: Goal: Will maintain a patent airway Outcome: Progressing   Problem: Cardiac: Goal: Ability to achieve and maintain adequate cardiopulmonary perfusion will improve Outcome: Progressing   Problem: Coping: Goal: Level of anxiety will decrease Outcome: Progressing

## 2022-12-21 NOTE — Progress Notes (Signed)
PROGRESS NOTE    Penny Miller  BMW:[id number] DOB: Jun 10, 1931 DOA: 12/19/2022 PCP: Etta Grandchild, MD     Brief Narrative:   Penny Miller is a pleasant 89 y.o. female with medical history significant for sick sinus syndrome with pacemaker, hypertension, type 2 diabetes mellitus, history of CVA, and PAF on Eliquis who presented with dysuria and generalized weakness.   Patient reports several days of dysuria and urinary urgency and frequency.  She has also developed worsening generalized weakness which has progressed to the point where she has been unable to get up on her own for the past 2 days.  Family helped her to the commode this evening, but she slid off and onto the floor due to weakness.  She believes she may have hit the left side of her head on the ground but denies losing consciousness.  She denies any recent chest pain or palpitations.  She reports chills for the past 2 days and believes she has had a fever.   Assessment & Plan:   Principal Problem:   UTI (urinary tract infection) Active Problems:   DM (diabetes mellitus), type 2 with renal complications (HCC)   CKD stage 3b, GFR 30-44 ml/min (HCC)   History of CVA (cerebrovascular accident)   Paroxysmal atrial fibrillation (HCC)   Spinal stenosis, lumbar region with neurogenic claudication   Pacemaker   1. UTI , present on admission - Not septic on admission  -Urine culture no growth so far.  Previous urine culture with Enterococcus.  Continue ciprofloxacin.   2. General weakness/deconditioning: - No focal deficit or acute findings on head CT or ct c spine Does have history neurogenic claudication, compression fractures, also relatively recent problems with constipation - Likely d/t acute UTI  - PT advising home health -MRI of the T-spine with no acute findings, remote T12 compression fracture.  MRI of the lumbar spine with no acute findings.  Advance lumbar spine degenerative changes.   3. PAF  - Continue  Eliquis     4. Hypertension  - Continue Norvasc    5. Type II DM  - Check CBGs and use low-intensity SSI if needed    6. CKD 3B  - SCr is 1.61 on admission; baseline appears to be closer to 1.3  - will monitor for now.  Creatinine 1.8.  Will need close monitoring.  7. Hx of CVA  - Continue Lipitor and Eliquis   8. Ruq pain Reports recent history of ruq pain precipitated by eating. None currently, normal lfts -Right upper quadrant ultrasound was essentially normal. Clinical improvement today.   DVT prophylaxis: apixaban Code Status: dnr Family Communication: Son and daughter-in-law at the bedside.  Level of care: Med-Surg     Consultants:  none  Procedures: none  Antimicrobials:  cipro    Subjective: Patient seen and examined.  Denies any complaints.  She feels very weak otherwise no other complaints.  Her son arrived at the bedside.  Remains afebrile. Needs to work few more times today with therapies and nursing staff to gain some more strength before going home.  Objective: Vitals:   12/20/22 0753 12/20/22 1937 12/21/22 0427 12/21/22 0734  BP: (!) 142/59 132/66 (!) 136/90 131/69  Pulse: 62 61 60 68  Resp: 20 17 17 18   Temp: 98.2 F (36.8 C) 97.8 F (36.6 C) 98.9 F (37.2 C) 98 F (36.7 C)  TempSrc: Oral Oral Oral Oral  SpO2: 96% 91% 93% 92%  Weight:  Height:        Intake/Output Summary (Last 24 hours) at 12/21/2022 0740 Last data filed at 12/20/2022 1820 Gross per 24 hour  Intake 540 ml  Output 200 ml  Net 340 ml   Filed Weights   12/19/22 2135 12/20/22 0022 12/20/22 0415  Weight: 59.9 kg 66.8 kg 68.5 kg    Examination:  General: Looks fairly comfortable. Cardiovascular: S1-S2 normal.  Regular rate rhythm.  On room air. Respiratory: Bilateral clear.  No added sounds. Gastrointestinal: Soft.  Nontender.  Bowel sound present. Ext: No swelling or edema. Neuro: Alert awake mostly oriented.  Generalized weakness. Musculoskeletal: No  deformities. Skin: Intact.     Data Reviewed: I have personally reviewed following labs and imaging studies  CBC: Recent Labs  Lab 12/19/22 2122 12/20/22 0629 12/21/22 0623  WBC 10.5 9.1 9.0  NEUTROABS 8.6*  --   --   HGB 16.2* 13.5 13.2  HCT 49.1* 42.5 39.3  MCV 98.0 96.8 95.2  PLT 152 131* 126*   Basic Metabolic Panel: Recent Labs  Lab 12/19/22 2122 12/20/22 0629 12/21/22 0623  NA 134* 135 132*  K 4.2 4.3 4.3  CL 101 100 101  CO2 24 23 24   GLUCOSE 136* 107* 105*  BUN 25* 28* 36*  CREATININE 1.61* 1.77* 1.85*  CALCIUM 9.2 8.6* 8.8*  MG  --  2.1  --   PHOS  --  4.4  --    GFR: Estimated Creatinine Clearance: 20 mL/min (A) (by C-G formula based on SCr of 1.85 mg/dL (H)). Liver Function Tests: Recent Labs  Lab 12/19/22 2122  AST 16  ALT 11  ALKPHOS 106  BILITOT 0.9  PROT 7.0  ALBUMIN 3.6   No results for input(s): "LIPASE", "AMYLASE" in the last 168 hours. No results for input(s): "AMMONIA" in the last 168 hours. Coagulation Profile: No results for input(s): "INR", "PROTIME" in the last 168 hours. Cardiac Enzymes: Recent Labs  Lab 12/20/22 0629  CKTOTAL 48   BNP (last 3 results) Recent Labs    02/20/22 1150 03/04/22 0958  PROBNP 1,012.0* 863.0*   HbA1C: No results for input(s): "HGBA1C" in the last 72 hours. CBG: No results for input(s): "GLUCAP" in the last 168 hours. Lipid Profile: No results for input(s): "CHOL", "HDL", "LDLCALC", "TRIG", "CHOLHDL", "LDLDIRECT" in the last 72 hours. Thyroid Function Tests: Recent Labs    12/20/22 0629  TSH 1.392   Anemia Panel: No results for input(s): "VITAMINB12", "FOLATE", "FERRITIN", "TIBC", "IRON", "RETICCTPCT" in the last 72 hours. Urine analysis:    Component Value Date/Time   COLORURINE YELLOW 12/19/2022 2203   APPEARANCEUR CLOUDY (A) 12/19/2022 2203   APPEARANCEUR Cloudy (A) 12/17/2013 0906   LABSPEC 1.007 12/19/2022 2203   PHURINE 5.0 12/19/2022 2203   GLUCOSEU >=500 (A) 12/19/2022  2203   GLUCOSEU NEGATIVE 08/08/2021 1627   HGBUR SMALL (A) 12/19/2022 2203   BILIRUBINUR NEGATIVE 12/19/2022 2203   BILIRUBINUR neg 08/19/2022 1603   BILIRUBINUR Negative 12/17/2013 0906   KETONESUR NEGATIVE 12/19/2022 2203   PROTEINUR 30 (A) 12/19/2022 2203   UROBILINOGEN negative (A) 08/19/2022 1603   UROBILINOGEN 0.2 08/08/2021 1627   NITRITE NEGATIVE 12/19/2022 2203   LEUKOCYTESUR LARGE (A) 12/19/2022 2203   Sepsis Labs: @LABRCNTIP (procalcitonin:4,lacticidven:4)  ) Recent Results (from the past 240 hour(s))  Resp panel by RT-PCR (RSV, Flu A&B, Covid) Urine, Clean Catch     Status: None   Collection Time: 12/19/22 10:03 PM   Specimen: Urine, Clean Catch; Nasal Swab  Result Value Ref  Range Status   SARS Coronavirus 2 by RT PCR NEGATIVE NEGATIVE Final   Influenza A by PCR NEGATIVE NEGATIVE Final   Influenza B by PCR NEGATIVE NEGATIVE Final    Comment: (NOTE) The Xpert Xpress SARS-CoV-2/FLU/RSV plus assay is intended as an aid in the diagnosis of influenza from Nasopharyngeal swab specimens and should not be used as a sole basis for treatment. Nasal washings and aspirates are unacceptable for Xpert Xpress SARS-CoV-2/FLU/RSV testing.  Fact Sheet for Patients: BloggerCourse.com  Fact Sheet for Healthcare Providers: SeriousBroker.it  This test is not yet approved or cleared by the [place] FDA and has been authorized for detection and/or diagnosis of SARS-CoV-2 by FDA under an Emergency Use Authorization (EUA). This EUA will remain in effect (meaning this test can be used) for the duration of the COVID-19 declaration under Section 564(b)(1) of the Act, 21 U.S.C. section 360bbb-3(b)(1), unless the authorization is terminated or revoked.     Resp Syncytial Virus by PCR NEGATIVE NEGATIVE Final    Comment: (NOTE) Fact Sheet for Patients: BloggerCourse.com  Fact Sheet for Healthcare  Providers: SeriousBroker.it  This test is not yet approved or cleared by the [place] FDA and has been authorized for detection and/or diagnosis of SARS-CoV-2 by FDA under an Emergency Use Authorization (EUA). This EUA will remain in effect (meaning this test can be used) for the duration of the COVID-19 declaration under Section 564(b)(1) of the Act, 21 U.S.C. section 360bbb-3(b)(1), unless the authorization is terminated or revoked.  Performed at [facility], [address], [place], [place] [address]          Radiology Studies: MR LUMBAR SPINE WO CONTRAST  Result Date: 12/21/2022 CLINICAL DATA:  Weakness.  History of neurogenic claudication EXAM: MRI THORACIC AND LUMBAR SPINE WITHOUT CONTRAST TECHNIQUE: Multiplanar and multiecho pulse sequences of the thoracic and lumbar spine were obtained without intravenous contrast. COMPARISON:  Lumbar MRI 12/12/2020 FINDINGS: MRI THORACIC SPINE FINDINGS Alignment: Mild degenerative anterolisthesis at C7-T1 to T2-3. Mild anterolisthesis at T11-12 and retrolisthesis at T12-L1. Vertebrae: Interval but chronic appearing T12 compression fracture with sclerosis and advanced height loss, also seen on a December 2023 chest CT. Some retropulsion is present, but no neural compression. Reactive appearing edematous signal at the endplates of T11-12 and T12-L1. Remote and healed T3 superior endplate fracture. Cord:  Normal signal and morphology. Paraspinal and other soft tissues: No evidence of perispinal mass or inflammation. Disc levels: Limited degenerative change for age. The canal and foramina are diffusely patent. Truncated and motion degraded study. No sagittal T2 weighted imaging acquired. MRI LUMBAR SPINE FINDINGS Segmentation:  Standard. Alignment: Degenerative anterolisthesis at L3-4 and L4-5. Mild levoscoliosis Vertebrae: Reactive endplate edema at L1 superiorly. No acute fracture or aggressive bone lesion Conus  medullaris and cauda equina: Conus extends to the L1 level. Conus and cauda equina appear normal. Paraspinal and other soft tissues: No perispinal mass or inflammation. Disc levels: T12- L1: Disc narrowing and bulging. Degenerative facet spurring on both sides. Mild to moderate biforaminal narrowing L1-L2: Unremarkable for age L2-L3: Disc collapse and endplate degeneration with endplate and facet spurring eccentric to the right. Mild right foraminal stenosis L3-L4: Degenerative facet spurring with mild anterolisthesis. The disc is bulging with central protrusion. High-grade thecal sac stenosis, stable or mildly progressed. The foramina are patent L4-L5: Disc collapse and endplate degeneration with circumferential bulging and endplate ridging. Degenerative facet spurring on both sides. High-grade thecal sac stenosis. Right more than left foraminal narrowing with L4 root flattening.  L5-S1:Disc collapse and endplate degeneration with disc bulging and ridging eccentric to the left. Chronic left foraminal protrusion. Left foraminal impingement. IMPRESSION: MR THORACIC SPINE IMPRESSION 1. Truncated and motion degraded exam. 2. No acute finding or neural impingement. 3. Remote T12 compression fracture with advanced central height loss and adjacent listhesis. There is reactive discogenic endplate edema at the adjacent disc spaces. 4. Mild for age thoracic spine degeneration. MR LUMBAR SPINE IMPRESSION 1. No acute finding.  No significant change when compared to 2022. 2. Advanced lumbar spine degeneration with mild scoliosis and levels of listhesis. 3. Compressive spinal stenosis at L3-4 and L4-5. 4. High-grade foraminal impingement bilaterally at L4-5 and on the left at L5-S1. Electronically Signed   By: Tiburcio Pea M.D.   On: 12/21/2022 04:37   MR THORACIC SPINE WO CONTRAST  Result Date: 12/21/2022 CLINICAL DATA:  Weakness.  History of neurogenic claudication EXAM: MRI THORACIC AND LUMBAR SPINE WITHOUT CONTRAST  TECHNIQUE: Multiplanar and multiecho pulse sequences of the thoracic and lumbar spine were obtained without intravenous contrast. COMPARISON:  Lumbar MRI 12/12/2020 FINDINGS: MRI THORACIC SPINE FINDINGS Alignment: Mild degenerative anterolisthesis at C7-T1 to T2-3. Mild anterolisthesis at T11-12 and retrolisthesis at T12-L1. Vertebrae: Interval but chronic appearing T12 compression fracture with sclerosis and advanced height loss, also seen on a December 2023 chest CT. Some retropulsion is present, but no neural compression. Reactive appearing edematous signal at the endplates of T11-12 and T12-L1. Remote and healed T3 superior endplate fracture. Cord:  Normal signal and morphology. Paraspinal and other soft tissues: No evidence of perispinal mass or inflammation. Disc levels: Limited degenerative change for age. The canal and foramina are diffusely patent. Truncated and motion degraded study. No sagittal T2 weighted imaging acquired. MRI LUMBAR SPINE FINDINGS Segmentation:  Standard. Alignment: Degenerative anterolisthesis at L3-4 and L4-5. Mild levoscoliosis Vertebrae: Reactive endplate edema at L1 superiorly. No acute fracture or aggressive bone lesion Conus medullaris and cauda equina: Conus extends to the L1 level. Conus and cauda equina appear normal. Paraspinal and other soft tissues: No perispinal mass or inflammation. Disc levels: T12- L1: Disc narrowing and bulging. Degenerative facet spurring on both sides. Mild to moderate biforaminal narrowing L1-L2: Unremarkable for age L2-L3: Disc collapse and endplate degeneration with endplate and facet spurring eccentric to the right. Mild right foraminal stenosis L3-L4: Degenerative facet spurring with mild anterolisthesis. The disc is bulging with central protrusion. High-grade thecal sac stenosis, stable or mildly progressed. The foramina are patent L4-L5: Disc collapse and endplate degeneration with circumferential bulging and endplate ridging. Degenerative  facet spurring on both sides. High-grade thecal sac stenosis. Right more than left foraminal narrowing with L4 root flattening. L5-S1:Disc collapse and endplate degeneration with disc bulging and ridging eccentric to the left. Chronic left foraminal protrusion. Left foraminal impingement. IMPRESSION: MR THORACIC SPINE IMPRESSION 1. Truncated and motion degraded exam. 2. No acute finding or neural impingement. 3. Remote T12 compression fracture with advanced central height loss and adjacent listhesis. There is reactive discogenic endplate edema at the adjacent disc spaces. 4. Mild for age thoracic spine degeneration. MR LUMBAR SPINE IMPRESSION 1. No acute finding.  No significant change when compared to 2022. 2. Advanced lumbar spine degeneration with mild scoliosis and levels of listhesis. 3. Compressive spinal stenosis at L3-4 and L4-5. 4. High-grade foraminal impingement bilaterally at L4-5 and on the left at L5-S1. Electronically Signed   By: Tiburcio Pea M.D.   On: 12/21/2022 04:37   US Abdomen Limited RUQ (LIVER/GB)  Result Date: 12/20/2022 CLINICAL DATA:  Abdominal Pain EXAM: ULTRASOUND ABDOMEN LIMITED RIGHT UPPER QUADRANT COMPARISON:  None Available. FINDINGS: Gallbladder: No gallstones or wall thickening visualized. No sonographic Murphy sign noted by sonographer. Common bile duct: Diameter: 2 mm Liver: No focal lesion identified. Within normal limits in parenchymal echogenicity. Portal vein is patent on color Doppler imaging with normal direction of blood flow towards the liver. Other: None. IMPRESSION: No gallstones or duct dilatation. Electronically Signed   By: Karen Kays M.D.   On: 12/20/2022 17:50   CT Head Wo Contrast  Result Date: 12/19/2022 CLINICAL DATA:  Fall, trauma EXAM: CT HEAD WITHOUT CONTRAST CT CERVICAL SPINE WITHOUT CONTRAST TECHNIQUE: Multidetector CT imaging of the head and cervical spine was performed following the standard protocol without intravenous contrast. Multiplanar  CT image reconstructions of the cervical spine were also generated. RADIATION DOSE REDUCTION: This exam was performed according to the departmental dose-optimization program which includes automated exposure control, adjustment of the mA and/or kV according to patient size and/or use of iterative reconstruction technique. COMPARISON:  CT head dated 03/12/2022 FINDINGS: CT HEAD FINDINGS Brain: No evidence of acute infarction, hemorrhage, hydrocephalus, extra-axial collection or mass lesion/mass effect. 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 Subcortical white matter and periventricular small vessel ischemic changes. Vascular: Intracranial atherosclerosis. Skull: Normal. Negative for fracture or focal lesion. Sinuses/Orbits: The visualized paranasal sinuses are essentially clear. The mastoid air cells are unopacified. Other: None. CT CERVICAL SPINE FINDINGS Alignment: Normal cervical lordosis. Skull base and vertebrae: No acute fracture. No primary bone lesion or focal pathologic process. Soft tissues and spinal canal: No prevertebral fluid or swelling. No visible canal hematoma. Disc levels: Mild degenerative changes of the mid cervical spine. Spinal canal is patent. Upper chest: Visualized lung apices are clear. Other: Visualized thyroid is unremarkable. IMPRESSION: No acute intracranial abnormality. 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 Small vessel ischemic changes. No traumatic injury to the cervical spine. Mild degenerative changes. Electronically Signed   By: Charline Bills M.D.   On: 12/19/2022 22:18   CT Cervical Spine Wo Contrast  Result Date: 12/19/2022 CLINICAL DATA:  Fall, trauma EXAM: CT HEAD WITHOUT CONTRAST CT CERVICAL SPINE WITHOUT CONTRAST TECHNIQUE: Multidetector CT imaging of the head and cervical spine was performed following the standard protocol without intravenous contrast. Multiplanar CT image reconstructions of the cervical spine were also generated. RADIATION DOSE REDUCTION:  This exam was performed according to the departmental dose-optimization program which includes automated exposure control, adjustment of the mA and/or kV according to patient size and/or use of iterative reconstruction technique. COMPARISON:  CT head dated 03/12/2022 FINDINGS: CT HEAD FINDINGS Brain: No evidence of acute infarction, hemorrhage, hydrocephalus, extra-axial collection or mass lesion/mass effect. 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 Subcortical white matter and periventricular small vessel ischemic changes. Vascular: Intracranial atherosclerosis. Skull: Normal. Negative for fracture or focal lesion. Sinuses/Orbits: The visualized paranasal sinuses are essentially clear. The mastoid air cells are unopacified. Other: None. CT CERVICAL SPINE FINDINGS Alignment: Normal cervical lordosis. Skull base and vertebrae: No acute fracture. No primary bone lesion or focal pathologic process. Soft tissues and spinal canal: No prevertebral fluid or swelling. No visible canal hematoma. Disc levels: Mild degenerative changes of the mid cervical spine. Spinal canal is patent. Upper chest: Visualized lung apices are clear. Other: Visualized thyroid is unremarkable. IMPRESSION: No acute intracranial abnormality. 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 Small vessel ischemic changes. No traumatic injury to the cervical spine. Mild degenerative changes. Electronically Signed   By: Charline Bills M.D.   On: 12/19/2022  22:18   DG Chest Port 1 View  Result Date: 12/19/2022 CLINICAL DATA:  Weakness recent fall, initial encounter EXAM: PORTABLE CHEST 1 VIEW COMPARISON:  09/05/2022 FINDINGS: Cardiac shadow is within normal limits. Pacing device is again seen. Aortic calcifications are noted. The lungs are clear bilaterally. No bony abnormality is noted. IMPRESSION: No acute abnormality noted. Electronically Signed   By: Alcide Clever M.D.   On: 12/19/2022 22:07        Scheduled Meds:  amLODipine  2.5 mg Oral  QHS   apixaban  2.5 mg Oral BID   atorvastatin  40 mg Oral QHS   citalopram  10 mg Oral Daily   gabapentin  300 mg Oral BID   influenza vaccine adjuvanted  0.5 mL Intramuscular Tomorrow-1000   Continuous Infusions:  ciprofloxacin 200 mg (12/20/22 2156)     LOS: 1 day     Dorcas Carrow, MD [facility]

## 2022-12-22 DIAGNOSIS — N390 Urinary tract infection, site not specified: Secondary | ICD-10-CM | POA: Diagnosis not present

## 2022-12-22 LAB — CBC
HCT: 40.7 % (ref 36.0–46.0)
Hemoglobin: 13.6 g/dL (ref 12.0–15.0)
MCH: 31.3 pg (ref 26.0–34.0)
MCHC: 33.4 g/dL (ref 30.0–36.0)
MCV: 93.8 fL (ref 80.0–100.0)
Platelets: 133 10*3/uL — ABNORMAL LOW (ref 150–400)
RBC: 4.34 MIL/uL (ref 3.87–5.11)
RDW: 13.1 % (ref 11.5–15.5)
WBC: 7 10*3/uL (ref 4.0–10.5)
nRBC: 0 % (ref 0.0–0.2)

## 2022-12-22 LAB — BASIC METABOLIC PANEL
Anion gap: 5 (ref 5–15)
BUN: 40 mg/dL — ABNORMAL HIGH (ref 8–23)
CO2: 24 mmol/L (ref 22–32)
Calcium: 8.6 mg/dL — ABNORMAL LOW (ref 8.9–10.3)
Chloride: 105 mmol/L (ref 98–111)
Creatinine, Ser: 1.74 mg/dL — ABNORMAL HIGH (ref 0.44–1.00)
GFR, Estimated: 28 mL/min — ABNORMAL LOW (ref >60)
Glucose, Bld: 114 mg/dL — ABNORMAL HIGH (ref 70–99)
Potassium: 4.3 mmol/L (ref 3.5–5.1)
Sodium: 134 mmol/L — ABNORMAL LOW (ref 135–145)

## 2022-12-22 LAB — URINE CULTURE: Culture: >=100000 — AB

## 2022-12-22 MED ORDER — CIPROFLOXACIN HCL 250 MG PO TABS: 250.0000 mg | ORAL_TABLET | Freq: Two times a day (BID) | ORAL | 0 refills | Status: AC

## 2022-12-22 NOTE — Discharge Summary (Signed)
Physician Discharge Summary  Penny Miller WGN:[id number] DOB: 01-06-1932 DOA: 12/19/2022  PCP: Etta Grandchild, MD  Admit date: 12/19/2022 Discharge date: 12/22/2022  Admitted From: home  Disposition:  home   Recommendations for Outpatient Follow-up:  Follow up with PCP in 1-2 weeks Please obtain BMP/CBC in one week   Home Health:PT/OT  Equipment/Devices:none    Discharge Condition:stable   CODE STATUS:DNR Diet recommendation: low salt diet   Discharge Summary: Penny Miller is a pleasant 89 y.o. female with medical history significant for sick sinus syndrome with pacemaker, hypertension, type 2 diabetes mellitus, history of CVA, and PAF on Eliquis who presented with dysuria and generalized weakness.  Patient reported several days of dysuria and urinary urgency and frequency.  She has also developed worsening generalized weakness which has progressed to the point where she has been unable to get up on her own for the past 2 days.  Family helped her to the commode this evening, but she slid off and onto the floor due to weakness.  She believes she may have hit the left side of her head on the ground but denies losing consciousness.  She denies any recent chest pain or palpitations.  She reported chills for the past 2 days and believes she has had a fever. Patient was found to have     1. UTI , present on admission, grew Hafnia Alvei - treated with ciprofloxacin.  Good clinical response.  Due to good clinical response on ciprofloxacin and sensitivity, will prescribe 5 more days of therapy to complete 7 days of therapy.   2. General weakness/deconditioning: - No focal deficit or acute findings on head CT or ct c spine Does have history neurogenic claudication, compression fractures, also relatively recent problems with constipation - Likely d/t acute UTI  - PT advising home health -MRI of the T-spine with no acute findings, remote T12 compression fracture.  MRI of the lumbar spine with  no acute findings.  Advance lumbar spine degenerative changes.   3. PAF  - Continue Eliquis.  Sinus rhythm.  On carvedilol.   4. Hypertension  - Continue Norvasc    5. Type II DM  -Stable.   6. CKD 3B  - SCr is 1.61 on admission; baseline appears to be closer to 1.3  - will monitor for now.  Creatinine 1.8-1.7.  Will need close monitoring as outpatient.   7. Hx of CVA  - Continue Lipitor and Eliquis .  No evidence of new stroke.   8. Ruq pain Reports recent history of ruq pain precipitated by eating. None currently, normal lfts -Right upper quadrant ultrasound was essentially normal. -No more pain.  Medically stable to discharge home with family.     Discharge Diagnoses:  Principal Problem:   UTI (urinary tract infection) Active Problems:   DM (diabetes mellitus), type 2 with renal complications (HCC)   CKD stage 3b, GFR 30-44 ml/min (HCC)   History of CVA (cerebrovascular accident)   Paroxysmal atrial fibrillation (HCC)   Spinal stenosis, lumbar region with neurogenic claudication   Pacemaker    Discharge Instructions  Discharge Instructions     Diet - low sodium heart healthy   Complete by: As directed    Increase activity slowly   Complete by: As directed       Allergies as of 12/22/2022       Reactions   Adhesive [tape] Rash, Other (See Comments)   Tears the skin also   Ceclor [cefaclor] Other (See Comments)  Unknown reaction; not recalled   Codeine Nausea And Vomiting   Jardiance [empagliflozin] Diarrhea   Lopid [gemfibrozil] Other (See Comments)   Unknown reaction; not recalled   Metformin And Related Diarrhea   Metoprolol Nausea And Vomiting   Morphine And Codeine Other (See Comments)   Passed out   Niacin And Related Other (See Comments)   Unknown reaction   Relafen [nabumetone] Nausea And Vomiting   Septra [sulfamethoxazole-trimethoprim] Nausea And Vomiting   Tetracyclines & Related Nausea And Vomiting        Medication List      TAKE these medications    Accu-Chek Aviva Plus test strip Generic drug: glucose blood USE TO TEST BLOOD SUGAR TWICE DAILY.   accu-chek multiclix lancets Use to test blood sugar twice daily. E11.29   acetaminophen 500 MG tablet Commonly known as: TYLENOL Take 500 mg by mouth every 6 (six) hours as needed (pain).   amLODipine 2.5 MG tablet Commonly known as: NORVASC Take 2.5 mg by mouth at bedtime.   atorvastatin 40 MG tablet Commonly known as: LIPITOR TAKE 1 TABLET AT BEDTIME   ciprofloxacin 250 MG tablet Commonly known as: CIPRO Take 1 tablet (250 mg total) by mouth 2 (two) times daily for 4 days.   citalopram 10 MG tablet Commonly known as: CELEXA TAKE 1 TABLET DAILY   Eliquis 2.5 MG Tabs tablet Generic drug: apixaban TAKE 1 TABLET TWICE A DAY   famotidine 20 MG tablet Commonly known as: PEPCID Take one tablet once to twice a day What changed:  how much to take how to take this when to take this reasons to take this   Farxiga 10 MG Tabs tablet Generic drug: dapagliflozin propanediol TAKE 1 TABLET DAILY BEFORE BREAKFAST   ferrous sulfate 325 (65 FE) MG EC tablet Take 325 mg by mouth daily with breakfast. With Vitamin C   gabapentin 300 MG capsule Commonly known as: NEURONTIN TAKE 1 CAPSULE THREE TIMES A DAY   levocetirizine 5 MG tablet Commonly known as: XYZAL TAKE ONE-HALF (1/2) TABLET (2.5 MG) EVERY OTHER DAY   Vitamin D (Ergocalciferol) 1.25 MG (50000 UNIT) Caps capsule Commonly known as: DRISDOL TAKE 1 CAPSULE EVERY 7 DAYS        Follow-up Information     [facility].   Specialty: Home Health Services Why: They will call you to schedule services 24-48 hours after discharge Contact information: [address] [place] [address] [phone]                Allergies  Allergen Reactions   Adhesive [Tape] Rash and Other (See Comments)    Tears the skin also   Ceclor [Cefaclor] Other (See  Comments)    Unknown reaction; not recalled   Codeine Nausea And Vomiting   Jardiance [Empagliflozin] Diarrhea   Lopid [Gemfibrozil] Other (See Comments)    Unknown reaction; not recalled   Metformin And Related Diarrhea   Metoprolol Nausea And Vomiting   Morphine And Codeine Other (See Comments)    Passed out   Niacin And Related Other (See Comments)    Unknown reaction   Relafen [Nabumetone] Nausea And Vomiting   Septra [Sulfamethoxazole-Trimethoprim] Nausea And Vomiting   Tetracyclines & Related Nausea And Vomiting    Consultations: None   Procedures/Studies: MR LUMBAR SPINE WO CONTRAST  Result Date: 12/21/2022 CLINICAL DATA:  Weakness.  History of neurogenic claudication EXAM: MRI THORACIC AND LUMBAR SPINE WITHOUT CONTRAST TECHNIQUE: Multiplanar and multiecho pulse sequences of  the thoracic and lumbar spine were obtained without intravenous contrast. COMPARISON:  Lumbar MRI 12/12/2020 FINDINGS: MRI THORACIC SPINE FINDINGS Alignment: Mild degenerative anterolisthesis at C7-T1 to T2-3. Mild anterolisthesis at T11-12 and retrolisthesis at T12-L1. Vertebrae: Interval but chronic appearing T12 compression fracture with sclerosis and advanced height loss, also seen on a December 2023 chest CT. Some retropulsion is present, but no neural compression. Reactive appearing edematous signal at the endplates of T11-12 and T12-L1. Remote and healed T3 superior endplate fracture. Cord:  Normal signal and morphology. Paraspinal and other soft tissues: No evidence of perispinal mass or inflammation. Disc levels: Limited degenerative change for age. The canal and foramina are diffusely patent. Truncated and motion degraded study. No sagittal T2 weighted imaging acquired. MRI LUMBAR SPINE FINDINGS Segmentation:  Standard. Alignment: Degenerative anterolisthesis at L3-4 and L4-5. Mild levoscoliosis Vertebrae: Reactive endplate edema at L1 superiorly. No acute fracture or aggressive bone lesion Conus  medullaris and cauda equina: Conus extends to the L1 level. Conus and cauda equina appear normal. Paraspinal and other soft tissues: No perispinal mass or inflammation. Disc levels: T12- L1: Disc narrowing and bulging. Degenerative facet spurring on both sides. Mild to moderate biforaminal narrowing L1-L2: Unremarkable for age L2-L3: Disc collapse and endplate degeneration with endplate and facet spurring eccentric to the right. Mild right foraminal stenosis L3-L4: Degenerative facet spurring with mild anterolisthesis. The disc is bulging with central protrusion. High-grade thecal sac stenosis, stable or mildly progressed. The foramina are patent L4-L5: Disc collapse and endplate degeneration with circumferential bulging and endplate ridging. Degenerative facet spurring on both sides. High-grade thecal sac stenosis. Right more than left foraminal narrowing with L4 root flattening. L5-S1:Disc collapse and endplate degeneration with disc bulging and ridging eccentric to the left. Chronic left foraminal protrusion. Left foraminal impingement. IMPRESSION: MR THORACIC SPINE IMPRESSION 1. Truncated and motion degraded exam. 2. No acute finding or neural impingement. 3. Remote T12 compression fracture with advanced central height loss and adjacent listhesis. There is reactive discogenic endplate edema at the adjacent disc spaces. 4. Mild for age thoracic spine degeneration. MR LUMBAR SPINE IMPRESSION 1. No acute finding.  No significant change when compared to 2022. 2. Advanced lumbar spine degeneration with mild scoliosis and levels of listhesis. 3. Compressive spinal stenosis at L3-4 and L4-5. 4. High-grade foraminal impingement bilaterally at L4-5 and on the left at L5-S1. Electronically Signed   By: Tiburcio Pea M.D.   On: 12/21/2022 04:37   MR THORACIC SPINE WO CONTRAST  Result Date: 12/21/2022 CLINICAL DATA:  Weakness.  History of neurogenic claudication EXAM: MRI THORACIC AND LUMBAR SPINE WITHOUT CONTRAST  TECHNIQUE: Multiplanar and multiecho pulse sequences of the thoracic and lumbar spine were obtained without intravenous contrast. COMPARISON:  Lumbar MRI 12/12/2020 FINDINGS: MRI THORACIC SPINE FINDINGS Alignment: Mild degenerative anterolisthesis at C7-T1 to T2-3. Mild anterolisthesis at T11-12 and retrolisthesis at T12-L1. Vertebrae: Interval but chronic appearing T12 compression fracture with sclerosis and advanced height loss, also seen on a December 2023 chest CT. Some retropulsion is present, but no neural compression. Reactive appearing edematous signal at the endplates of T11-12 and T12-L1. Remote and healed T3 superior endplate fracture. Cord:  Normal signal and morphology. Paraspinal and other soft tissues: No evidence of perispinal mass or inflammation. Disc levels: Limited degenerative change for age. The canal and foramina are diffusely patent. Truncated and motion degraded study. No sagittal T2 weighted imaging acquired. MRI LUMBAR SPINE FINDINGS Segmentation:  Standard. Alignment: Degenerative anterolisthesis at L3-4 and L4-5. Mild levoscoliosis Vertebrae: Reactive endplate  edema at L1 superiorly. No acute fracture or aggressive bone lesion Conus medullaris and cauda equina: Conus extends to the L1 level. Conus and cauda equina appear normal. Paraspinal and other soft tissues: No perispinal mass or inflammation. Disc levels: T12- L1: Disc narrowing and bulging. Degenerative facet spurring on both sides. Mild to moderate biforaminal narrowing L1-L2: Unremarkable for age L2-L3: Disc collapse and endplate degeneration with endplate and facet spurring eccentric to the right. Mild right foraminal stenosis L3-L4: Degenerative facet spurring with mild anterolisthesis. The disc is bulging with central protrusion. High-grade thecal sac stenosis, stable or mildly progressed. The foramina are patent L4-L5: Disc collapse and endplate degeneration with circumferential bulging and endplate ridging. Degenerative  facet spurring on both sides. High-grade thecal sac stenosis. Right more than left foraminal narrowing with L4 root flattening. L5-S1:Disc collapse and endplate degeneration with disc bulging and ridging eccentric to the left. Chronic left foraminal protrusion. Left foraminal impingement. IMPRESSION: MR THORACIC SPINE IMPRESSION 1. Truncated and motion degraded exam. 2. No acute finding or neural impingement. 3. Remote T12 compression fracture with advanced central height loss and adjacent listhesis. There is reactive discogenic endplate edema at the adjacent disc spaces. 4. Mild for age thoracic spine degeneration. MR LUMBAR SPINE IMPRESSION 1. No acute finding.  No significant change when compared to 2022. 2. Advanced lumbar spine degeneration with mild scoliosis and levels of listhesis. 3. Compressive spinal stenosis at L3-4 and L4-5. 4. High-grade foraminal impingement bilaterally at L4-5 and on the left at L5-S1. Electronically Signed   By: Tiburcio Pea M.D.   On: 12/21/2022 04:37   US Abdomen Limited RUQ (LIVER/GB)  Result Date: 12/20/2022 CLINICAL DATA:  Abdominal Pain EXAM: ULTRASOUND ABDOMEN LIMITED RIGHT UPPER QUADRANT COMPARISON:  None Available. FINDINGS: Gallbladder: No gallstones or wall thickening visualized. No sonographic Murphy sign noted by sonographer. Common bile duct: Diameter: 2 mm Liver: No focal lesion identified. Within normal limits in parenchymal echogenicity. Portal vein is patent on color Doppler imaging with normal direction of blood flow towards the liver. Other: None. IMPRESSION: No gallstones or duct dilatation. Electronically Signed   By: Karen Kays M.D.   On: 12/20/2022 17:50   CT Head Wo Contrast  Result Date: 12/19/2022 CLINICAL DATA:  Fall, trauma EXAM: CT HEAD WITHOUT CONTRAST CT CERVICAL SPINE WITHOUT CONTRAST TECHNIQUE: Multidetector CT imaging of the head and cervical spine was performed following the standard protocol without intravenous contrast. Multiplanar  CT image reconstructions of the cervical spine were also generated. RADIATION DOSE REDUCTION: This exam was performed according to the departmental dose-optimization program which includes automated exposure control, adjustment of the mA and/or kV according to patient size and/or use of iterative reconstruction technique. COMPARISON:  CT head dated 03/12/2022 FINDINGS: CT HEAD FINDINGS Brain: No evidence of acute infarction, hemorrhage, hydrocephalus, extra-axial collection or mass lesion/mass effect. 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 Subcortical white matter and periventricular small vessel ischemic changes. Vascular: Intracranial atherosclerosis. Skull: Normal. Negative for fracture or focal lesion. Sinuses/Orbits: The visualized paranasal sinuses are essentially clear. The mastoid air cells are unopacified. Other: None. CT CERVICAL SPINE FINDINGS Alignment: Normal cervical lordosis. Skull base and vertebrae: No acute fracture. No primary bone lesion or focal pathologic process. Soft tissues and spinal canal: No prevertebral fluid or swelling. No visible canal hematoma. Disc levels: Mild degenerative changes of the mid cervical spine. Spinal canal is patent. Upper chest: Visualized lung apices are clear. Other: Visualized thyroid is unremarkable. IMPRESSION: No acute intracranial abnormality. 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  Small vessel ischemic changes. No traumatic injury to the cervical spine. Mild degenerative changes. Electronically Signed   By: Charline Bills M.D.   On: 12/19/2022 22:18   CT Cervical Spine Wo Contrast  Result Date: 12/19/2022 CLINICAL DATA:  Fall, trauma EXAM: CT HEAD WITHOUT CONTRAST CT CERVICAL SPINE WITHOUT CONTRAST TECHNIQUE: Multidetector CT imaging of the head and cervical spine was performed following the standard protocol without intravenous contrast. Multiplanar CT image reconstructions of the cervical spine were also generated. RADIATION DOSE REDUCTION:  This exam was performed according to the departmental dose-optimization program which includes automated exposure control, adjustment of the mA and/or kV according to patient size and/or use of iterative reconstruction technique. COMPARISON:  CT head dated 03/12/2022 FINDINGS: CT HEAD FINDINGS Brain: No evidence of acute infarction, hemorrhage, hydrocephalus, extra-axial collection or mass lesion/mass effect. 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 Subcortical white matter and periventricular small vessel ischemic changes. Vascular: Intracranial atherosclerosis. Skull: Normal. Negative for fracture or focal lesion. Sinuses/Orbits: The visualized paranasal sinuses are essentially clear. The mastoid air cells are unopacified. Other: None. CT CERVICAL SPINE FINDINGS Alignment: Normal cervical lordosis. Skull base and vertebrae: No acute fracture. No primary bone lesion or focal pathologic process. Soft tissues and spinal canal: No prevertebral fluid or swelling. No visible canal hematoma. Disc levels: Mild degenerative changes of the mid cervical spine. Spinal canal is patent. Upper chest: Visualized lung apices are clear. Other: Visualized thyroid is unremarkable. IMPRESSION: No acute intracranial abnormality. 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 Small vessel ischemic changes. No traumatic injury to the cervical spine. Mild degenerative changes. Electronically Signed   By: Charline Bills M.D.   On: 12/19/2022 22:18   DG Chest Port 1 View  Result Date: 12/19/2022 CLINICAL DATA:  Weakness recent fall, initial encounter EXAM: PORTABLE CHEST 1 VIEW COMPARISON:  09/05/2022 FINDINGS: Cardiac shadow is within normal limits. Pacing device is again seen. Aortic calcifications are noted. The lungs are clear bilaterally. No bony abnormality is noted. IMPRESSION: No acute abnormality noted. Electronically Signed   By: Alcide Clever M.D.   On: 12/19/2022 22:07   (Echo, Carotid, EGD, Colonoscopy, ERCP)     Subjective: Patient seen and examined.  Son and daughter-in-law at the bedside.  Patient herself feels weaker but overall better than how she came to the hospital with.  Dysuria has improved.  Eager to go home. Denies any acute pain.  Discharge Exam: Vitals:   12/22/22 0357 12/22/22 0755  BP: (!) 126/58 (!) 142/68  Pulse: 60 61  Resp: 18 20  Temp: 97.8 F (36.6 C) 97.9 F (36.6 C)  SpO2: 94% 90%   Vitals:   12/21/22 1558 12/21/22 2000 12/22/22 0357 12/22/22 0755  BP: (!) 108/52 (!) 140/69 (!) 126/58 (!) 142/68  Pulse: 61 64 60 61  Resp: 18 18 18 20   Temp: 98.5 F (36.9 C) (!) 97.5 F (36.4 C) 97.8 F (36.6 C) 97.9 F (36.6 C)  TempSrc: Oral Oral Oral Oral  SpO2: 92% 93% 94% 90%  Weight:      Height:        General: Pt is alert, awake, not in acute distress Cardiovascular: RRR, S1/S2 +, no rubs, no gallops Respiratory: CTA bilaterally, no wheezing, no rhonchi Abdominal: Soft, NT, ND, bowel sounds + Extremities: no edema, no cyanosis    The results of significant diagnostics from this hospitalization (including imaging, microbiology, ancillary and laboratory) are listed below for reference.     Microbiology: Recent Results (from the past 240 hour(s))  Resp panel by RT-PCR (RSV, Flu A&B, Covid) Urine, Clean Catch     Status: None   Collection Time: 12/19/22 10:03 PM   Specimen: Urine, Clean Catch; Nasal Swab  Result Value Ref Range Status   SARS Coronavirus 2 by RT PCR NEGATIVE NEGATIVE Final   Influenza A by PCR NEGATIVE NEGATIVE Final   Influenza B by PCR NEGATIVE NEGATIVE Final    Comment: (NOTE) The Xpert Xpress SARS-CoV-2/FLU/RSV plus assay is intended as an aid in the diagnosis of influenza from Nasopharyngeal swab specimens and should not be used as a sole basis for treatment. Nasal washings and aspirates are unacceptable for Xpert Xpress SARS-CoV-2/FLU/RSV testing.  Fact Sheet for Patients: BloggerCourse.com  Fact Sheet  for Healthcare Providers: SeriousBroker.it  This test is not yet approved or cleared by the [place] FDA and has been authorized for detection and/or diagnosis of SARS-CoV-2 by FDA under an Emergency Use Authorization (EUA). This EUA will remain in effect (meaning this test can be used) for the duration of the COVID-19 declaration under Section 564(b)(1) of the Act, 21 U.S.C. section 360bbb-3(b)(1), unless the authorization is terminated or revoked.     Resp Syncytial Virus by PCR NEGATIVE NEGATIVE Final    Comment: (NOTE) Fact Sheet for Patients: BloggerCourse.com  Fact Sheet for Healthcare Providers: SeriousBroker.it  This test is not yet approved or cleared by the [place] FDA and has been authorized for detection and/or diagnosis of SARS-CoV-2 by FDA under an Emergency Use Authorization (EUA). This EUA will remain in effect (meaning this test can be used) for the duration of the COVID-19 declaration under Section 564(b)(1) of the Act, 21 U.S.C. section 360bbb-3(b)(1), unless the authorization is terminated or revoked.  Performed at [facility], [address], [place], [place] [address]   Urine Culture     Status: Abnormal   Collection Time: 12/19/22 10:03 PM   Specimen: Urine, Random  Result Value Ref Range Status   Specimen Description URINE, RANDOM  Final   Special Requests   Final    NONE Reflexed from [id number] Performed at [facility], [address], [place], [place] [address]    Culture >=100,000 COLONIES/mL HAFNIA ALVEI (A)  Final   Report Status 12/22/2022 FINAL  Final   Organism ID, Bacteria HAFNIA ALVEI (A)  Final      Susceptibility   Hafnia alvei - MIC*    AMPICILLIN RESISTANT Resistant     CEFTRIAXONE 1 SENSITIVE Sensitive     CIPROFLOXACIN <=0.25 SENSITIVE Sensitive     GENTAMICIN <=1 SENSITIVE Sensitive     IMIPENEM <=0.25 SENSITIVE Sensitive      NITROFURANTOIN <=16 SENSITIVE Sensitive     TRIMETH/SULFA <=20 SENSITIVE Sensitive     AMPICILLIN/SULBACTAM >=32 RESISTANT Resistant     PIP/TAZO >=128 RESISTANT Resistant     * >=100,000 COLONIES/mL HAFNIA ALVEI     Labs: BNP (last 3 results) No results for input(s): "BNP" in the last 8760 hours. Basic Metabolic Panel: Recent Labs  Lab 12/19/22 2122 12/20/22 0629 12/21/22 0623 12/22/22 0756  NA 134* 135 132* 134*  K 4.2 4.3 4.3 4.3  CL 101 100 101 105  CO2 24 23 24 24   GLUCOSE 136* 107* 105* 114*  BUN 25* 28* 36* 40*  CREATININE 1.61* 1.77* 1.85* 1.74*  CALCIUM 9.2 8.6* 8.8* 8.6*  MG  --  2.1  --   --   PHOS  --  4.4  --   --    Liver  Function Tests: Recent Labs  Lab 12/19/22 2122  AST 16  ALT 11  ALKPHOS 106  BILITOT 0.9  PROT 7.0  ALBUMIN 3.6   No results for input(s): "LIPASE", "AMYLASE" in the last 168 hours. No results for input(s): "AMMONIA" in the last 168 hours. CBC: Recent Labs  Lab 12/19/22 2122 12/20/22 0629 12/21/22 0623 12/22/22 0756  WBC 10.5 9.1 9.0 7.0  NEUTROABS 8.6*  --   --   --   HGB 16.2* 13.5 13.2 13.6  HCT 49.1* 42.5 39.3 40.7  MCV 98.0 96.8 95.2 93.8  PLT 152 131* 126* 133*   Cardiac Enzymes: Recent Labs  Lab 12/20/22 0629  CKTOTAL 48   BNP: Invalid input(s): "POCBNP" CBG: No results for input(s): "GLUCAP" in the last 168 hours. D-Dimer No results for input(s): "DDIMER" in the last 72 hours. Hgb A1c No results for input(s): "HGBA1C" in the last 72 hours. Lipid Profile No results for input(s): "CHOL", "HDL", "LDLCALC", "TRIG", "CHOLHDL", "LDLDIRECT" in the last 72 hours. Thyroid function studies Recent Labs    12/20/22 0629  TSH 1.392   Anemia work up No results for input(s): "VITAMINB12", "FOLATE", "FERRITIN", "TIBC", "IRON", "RETICCTPCT" in the last 72 hours. Urinalysis    Component Value Date/Time   COLORURINE YELLOW 12/19/2022 2203   APPEARANCEUR CLOUDY (A) 12/19/2022 2203   APPEARANCEUR Cloudy (A)  12/17/2013 0906   LABSPEC 1.007 12/19/2022 2203   PHURINE 5.0 12/19/2022 2203   GLUCOSEU >=500 (A) 12/19/2022 2203   GLUCOSEU NEGATIVE 08/08/2021 1627   HGBUR SMALL (A) 12/19/2022 2203   BILIRUBINUR NEGATIVE 12/19/2022 2203   BILIRUBINUR neg 08/19/2022 1603   BILIRUBINUR Negative 12/17/2013 0906   KETONESUR NEGATIVE 12/19/2022 2203   PROTEINUR 30 (A) 12/19/2022 2203   UROBILINOGEN negative (A) 08/19/2022 1603   UROBILINOGEN 0.2 08/08/2021 1627   NITRITE NEGATIVE 12/19/2022 2203   LEUKOCYTESUR LARGE (A) 12/19/2022 2203   Sepsis Labs Recent Labs  Lab 12/19/22 2122 12/20/22 0629 12/21/22 0623 12/22/22 0756  WBC 10.5 9.1 9.0 7.0   Microbiology Recent Results (from the past 240 hour(s))  Resp panel by RT-PCR (RSV, Flu A&B, Covid) Urine, Clean Catch     Status: None   Collection Time: 12/19/22 10:03 PM   Specimen: Urine, Clean Catch; Nasal Swab  Result Value Ref Range Status   SARS Coronavirus 2 by RT PCR NEGATIVE NEGATIVE Final   Influenza A by PCR NEGATIVE NEGATIVE Final   Influenza B by PCR NEGATIVE NEGATIVE Final    Comment: (NOTE) The Xpert Xpress SARS-CoV-2/FLU/RSV plus assay is intended as an aid in the diagnosis of influenza from Nasopharyngeal swab specimens and should not be used as a sole basis for treatment. Nasal washings and aspirates are unacceptable for Xpert Xpress SARS-CoV-2/FLU/RSV testing.  Fact Sheet for Patients: BloggerCourse.com  Fact Sheet for Healthcare Providers: SeriousBroker.it  This test is not yet approved or cleared by the [place] FDA and has been authorized for detection and/or diagnosis of SARS-CoV-2 by FDA under an Emergency Use Authorization (EUA). This EUA will remain in effect (meaning this test can be used) for the duration of the COVID-19 declaration under Section 564(b)(1) of the Act, 21 U.S.C. section 360bbb-3(b)(1), unless the authorization is terminated or revoked.      Resp Syncytial Virus by PCR NEGATIVE NEGATIVE Final    Comment: (NOTE) Fact Sheet for Patients: BloggerCourse.com  Fact Sheet for Healthcare Providers: SeriousBroker.it  This test is not yet approved or cleared by the [place] and has been authorized  for detection and/or diagnosis of SARS-CoV-2 by FDA under an Emergency Use Authorization (EUA). This EUA will remain in effect (meaning this test can be used) for the duration of the COVID-19 declaration under Section 564(b)(1) of the Act, 21 U.S.C. section 360bbb-3(b)(1), unless the authorization is terminated or revoked.  Performed at [facility], [address] [place], [place] [address]   Urine Culture     Status: Abnormal   Collection Time: 12/19/22 10:03 PM   Specimen: Urine, Random  Result Value Ref Range Status   Specimen Description URINE, RANDOM  Final   Special Requests   Final    NONE Reflexed from [id number] Performed at [facility], [address], [place], [place] [address]    Culture >=100,000 COLONIES/mL HAFNIA ALVEI (A)  Final   Report Status 12/22/2022 FINAL  Final   Organism ID, Bacteria HAFNIA ALVEI (A)  Final      Susceptibility   Hafnia alvei - MIC*    AMPICILLIN RESISTANT Resistant     CEFTRIAXONE 1 SENSITIVE Sensitive     CIPROFLOXACIN <=0.25 SENSITIVE Sensitive     GENTAMICIN <=1 SENSITIVE Sensitive     IMIPENEM <=0.25 SENSITIVE Sensitive     NITROFURANTOIN <=16 SENSITIVE Sensitive     TRIMETH/SULFA <=20 SENSITIVE Sensitive     AMPICILLIN/SULBACTAM >=32 RESISTANT Resistant     PIP/TAZO >=128 RESISTANT Resistant     * >=100,000 COLONIES/mL HAFNIA ALVEI     Time coordinating discharge: 35 minutes  SIGNED:   Dorcas Carrow, MD  [facility] 12/22/2022, 10:05 AM

## 2022-12-22 NOTE — TOC Transition Note (Signed)
Transition of Care [facility]) - CM/SW Discharge Note   Patient Details  Name: Penny Miller MRN: [id number] Date of Birth: 1931-06-01  [facility] CM/SW Contact:  Lawerance Sabal, RN Phone Number: 12/22/2022, 10:42 AM   Clinical Narrative:     Rondel Jumbo that patient will DC today.  No Additional DME for home needed at this time.    Final next level of care: Home w Home Health Services Barriers to Discharge: No Barriers Identified   Patient Goals and CMS Choice CMS Medicare.gov Compare Post Acute Care list provided to:: Other (Comment Required) Choice offered to / list presented to : Adult Children  Discharge Placement                         Discharge Plan and Services Additional resources added to the After Visit Summary for                  DME Arranged: N/A         HH Arranged: PT, OT HH Agency: [facility] Date [facility] Agency Contacted: 12/22/22 Time HH Agency Contacted: [phone] Representative spoke with at [facility] Agency: Kandee Keen  Social Determinants of Health (SDOH) Interventions SDOH Screenings   Food Insecurity: No Food Insecurity (12/20/2022)  Housing: Low Risk  (12/20/2022)  Transportation Needs: No Transportation Needs (12/20/2022)  Utilities: Not At Risk (12/20/2022)  Alcohol Screen: Low Risk  (11/05/2021)  Depression (PHQ2-9): Low Risk  (12/17/2022)  Financial Resource Strain: Low Risk  (12/17/2022)  Physical Activity: Inactive (12/17/2022)  Social Connections: Moderately Integrated (12/17/2022)  Stress: No Stress Concern Present (12/17/2022)  Tobacco Use: Medium Risk (12/20/2022)  Health Literacy: Inadequate Health Literacy (12/17/2022)     Readmission Risk Interventions     No data to display

## 2022-12-24 ENCOUNTER — Telehealth: Payer: Self-pay | Admitting: Internal Medicine

## 2022-12-24 ENCOUNTER — Telehealth: Payer: Self-pay

## 2022-12-24 NOTE — Telephone Encounter (Signed)
Rosanne Ashing from [place] went to the pt's home to do the evaluation to start [facility] after her hospital visit, but the pt has declined the service.   Please call Rosanne Ashing with any questions: [phone]

## 2022-12-24 NOTE — Transitions of Care (Post Inpatient/ED Visit) (Signed)
12/24/2022  Name: Penny Miller MRN: [id number] DOB: 17-Aug-1931  Today's TOC FU Call Status:   Patient's Name and Date of Birth confirmed.  Transition Care Management Follow-up Telephone Call Date of Discharge: 12/22/22 Discharge Facility: [facility]) Type of Discharge: Inpatient Admission Primary Inpatient Discharge Diagnosis:: UTI How have you been since you were released from the hospital?: Better Any questions or concerns?: No  Items Reviewed: Did you receive and understand the discharge instructions provided?: Yes Medications obtained,verified, and reconciled?: Yes (Medications Reviewed) Any new allergies since your discharge?: No Dietary orders reviewed?: NA Do you have support at home?: Yes People in Home: child(ren), adult Name of Support/Comfort Primary Source: [name] and Rosalynne Pryer  Medications Reviewed Today: Medications Reviewed Today   Medications were not reviewed in this encounter     Home Care and Equipment/Supplies: Were Home Health Services Ordered?: Yes Name of Home Health Agency:: [facility] but the patient declined. Has Agency set up a time to come to your home?: No EMR reviewed for Home Health Orders:  (Patient resfused services.) Any new equipment or medical supplies ordered?: No  Functional Questionnaire: Do you need assistance with bathing/showering or dressing?: Yes Do you need assistance with meal preparation?: No Do you need assistance with eating?: No Do you have difficulty maintaining continence: No Do you need assistance with getting out of bed/getting out of a chair/moving?: No Do you have difficulty managing or taking your medications?: No (But her son and his wife helps her with her medication)  Follow up appointments reviewed: PCP Follow-up appointment confirmed?: Yes Date of PCP follow-up appointment?: 12/30/22 Follow-up Provider: Dr. Yetta Barre Specialist [facility] Follow-up appointment confirmed?: No (Patient doesn't need) Do you need  transportation to your follow-up appointment?: No Do you understand care options if your condition(s) worsen?: Yes-patient verbalized understanding    SIGNATURE: Reather Laurence, RMA

## 2022-12-27 ENCOUNTER — Ambulatory Visit: Payer: Medicare Other | Admitting: Physician Assistant

## 2022-12-30 ENCOUNTER — Encounter: Payer: Self-pay | Admitting: Internal Medicine

## 2022-12-30 ENCOUNTER — Ambulatory Visit: Payer: Medicare Other | Admitting: Internal Medicine

## 2022-12-30 ENCOUNTER — Other Ambulatory Visit: Payer: Self-pay | Admitting: Internal Medicine

## 2022-12-30 VITALS — BP 132/72 | HR 75 | Temp 97.8°F | Resp 16 | Ht 67.0 in | Wt 149.0 lb

## 2022-12-30 DIAGNOSIS — N184 Chronic kidney disease, stage 4 (severe): Secondary | ICD-10-CM | POA: Diagnosis not present

## 2022-12-30 DIAGNOSIS — E785 Hyperlipidemia, unspecified: Secondary | ICD-10-CM

## 2022-12-30 DIAGNOSIS — N3000 Acute cystitis without hematuria: Secondary | ICD-10-CM | POA: Insufficient documentation

## 2022-12-30 DIAGNOSIS — Z23 Encounter for immunization: Secondary | ICD-10-CM | POA: Diagnosis not present

## 2022-12-30 DIAGNOSIS — Z87448 Personal history of other diseases of urinary system: Secondary | ICD-10-CM

## 2022-12-30 DIAGNOSIS — N1832 Chronic kidney disease, stage 3b: Secondary | ICD-10-CM

## 2022-12-30 DIAGNOSIS — E1122 Type 2 diabetes mellitus with diabetic chronic kidney disease: Secondary | ICD-10-CM | POA: Diagnosis not present

## 2022-12-30 DIAGNOSIS — I6782 Cerebral ischemia: Secondary | ICD-10-CM

## 2022-12-30 LAB — HEMOGLOBIN A1C: Hgb A1c MFr Bld: 6.5 % (ref 4.6–6.5)

## 2022-12-30 NOTE — Patient Instructions (Signed)

## 2022-12-30 NOTE — Progress Notes (Unsigned)
Subjective:  Patient ID: Penny Miller, female    DOB: [birth year]  Age: 89 y.o. MRN: [id number]  CC: Diabetes   HPI Penny Miller presents for f/up ----  Discussed the use of AI scribe software for clinical note transcription with the patient, who gave verbal consent to proceed.  History of Present Illness   The patient, with a recent history of a urinary tract infection, reports feeling significantly better after treatment. They describe a mild burning sensation during urination, but this has largely resolved. They deny any fever, chills, or night sweats. However, they report difficulty initiating urination, attributing this to age. There is no reported hematuria.  The patient also reports ongoing issues with bowel movements, which they describe as not being constipated, but having difficulty with stool evacuation. The stool is described as neither too hard nor too soft. They report a recent change in diet to include more wheat bread, which seems to have improved their bowel movements.          Admit date: 12/19/2022 Discharge date: 12/22/2022   Admitted From: home  Disposition:  home    Recommendations for Outpatient Follow-up:  Follow up with PCP in 1-2 weeks Please obtain BMP/CBC in one week     Home Health:PT/OT  Equipment/Devices:none     Discharge Condition:stable   CODE STATUS:DNR Diet recommendation: low salt diet    Discharge Summary: Penny Miller is a pleasant 89 y.o. female with medical history significant for sick sinus syndrome with pacemaker, hypertension, type 2 diabetes mellitus, history of CVA, and PAF on Eliquis who presented with dysuria and generalized weakness.  Patient reported several days of dysuria and urinary urgency and frequency.  She has also developed worsening generalized weakness which has progressed to the point where she has been unable to get up on her own for the past 2 days.  Family helped her to the commode this evening, but she slid off  and onto the floor due to weakness.  She believes she may have hit the left side of her head on the ground but denies losing consciousness.  She denies any recent chest pain or palpitations.  She reported chills for the past 2 days and believes she has had a fever. Patient was found to have      1. UTI , present on admission, grew Hafnia Alvei - treated with ciprofloxacin.  Good clinical response.  Due to good clinical response on ciprofloxacin and sensitivity, will prescribe 5 more days of therapy to complete 7 days of therapy.   2. General weakness/deconditioning: - No focal deficit or acute findings on head CT or ct c spine Does have history neurogenic claudication, compression fractures, also relatively recent problems with constipation - Likely d/t acute UTI  - PT advising home health -MRI of the T-spine with no acute findings, remote T12 compression fracture.  MRI of the lumbar spine with no acute findings.  Advance lumbar spine degenerative changes.   3. PAF  - Continue Eliquis.  Sinus rhythm.  On carvedilol.   4. Hypertension  - Continue Norvasc    5. Type II DM  -Stable.   6. CKD 3B  - SCr is 1.61 on admission; baseline appears to be closer to 1.3  - will monitor for now.  Creatinine 1.8-1.7.  Will need close monitoring as outpatient.    Outpatient Medications Prior to Visit  Medication Sig Dispense Refill   ACCU-CHEK AVIVA PLUS test strip USE TO TEST BLOOD SUGAR TWICE  DAILY. 100 each 11   acetaminophen (TYLENOL) 500 MG tablet Take 500 mg by mouth every 6 (six) hours as needed (pain).     amLODipine (NORVASC) 2.5 MG tablet Take 2.5 mg by mouth at bedtime.     apixaban (ELIQUIS) 2.5 MG TABS tablet TAKE 1 TABLET TWICE A DAY 180 tablet 1   atorvastatin (LIPITOR) 40 MG tablet TAKE 1 TABLET AT BEDTIME 90 tablet 0   citalopram (CELEXA) 10 MG tablet TAKE 1 TABLET DAILY 90 tablet 1   dapagliflozin propanediol (FARXIGA) 10 MG TABS tablet TAKE 1 TABLET DAILY BEFORE BREAKFAST 90  tablet 0   famotidine (PEPCID) 20 MG tablet Take one tablet once to twice a day (Patient taking differently: Take 20 mg by mouth daily as needed for heartburn. Take one tablet once to twice a day) 60 tablet 0   ferrous sulfate 325 (65 FE) MG EC tablet Take 325 mg by mouth daily with breakfast. With Vitamin C     gabapentin (NEURONTIN) 300 MG capsule TAKE 1 CAPSULE THREE TIMES A DAY 270 capsule 3   Lancets (ACCU-CHEK MULTICLIX) lancets Use to test blood sugar twice daily. E11.29 100 each 11   levocetirizine (XYZAL) 5 MG tablet TAKE ONE-HALF (1/2) TABLET (2.5 MG) EVERY OTHER DAY 23 tablet 3   Vitamin D, Ergocalciferol, (DRISDOL) 1.25 MG (50000 UNIT) CAPS capsule TAKE 1 CAPSULE EVERY 7 DAYS 12 capsule 0   No facility-administered medications prior to visit.    ROS Review of Systems  Objective:  BP 132/72 (BP Location: Right Arm, Patient Position: Sitting, Cuff Size: Large)   Pulse 75   Temp 97.8 F (36.6 C) (Oral)   Resp 16   Ht 5\' 7"  (1.702 m)   Wt 149 lb (67.6 kg)   SpO2 93%   BMI 23.34 kg/m   BP Readings from Last 3 Encounters:  12/30/22 132/72  12/22/22 (!) 142/68  11/25/22 130/78    Wt Readings from Last 3 Encounters:  12/30/22 149 lb (67.6 kg)  12/20/22 151 lb 0.2 oz (68.5 kg)  12/17/22 132 lb (59.9 kg)    Physical Exam  Lab Results  Component Value Date   WBC 7.0 12/22/2022   HGB 13.6 12/22/2022   HCT 40.7 12/22/2022   PLT 133 (L) 12/22/2022   GLUCOSE 114 (H) 12/22/2022   CHOL 166 05/28/2021   TRIG 192.0 (H) 05/28/2021   HDL 44.10 05/28/2021   LDLCALC 84 05/28/2021   ALT 11 12/19/2022   AST 16 12/19/2022   NA 134 (L) 12/22/2022   K 4.3 12/22/2022   CL 105 12/22/2022   CREATININE 1.74 (H) 12/22/2022   BUN 40 (H) 12/22/2022   CO2 24 12/22/2022   TSH 1.392 12/20/2022   INR 1.2 07/16/2021   HGBA1C 4.9 01/23/2022   MICROALBUR 9.7 (H) 05/28/2021    MR LUMBAR SPINE WO CONTRAST  Result Date: 12/21/2022 CLINICAL DATA:  Weakness.  History of neurogenic  claudication EXAM: MRI THORACIC AND LUMBAR SPINE WITHOUT CONTRAST TECHNIQUE: Multiplanar and multiecho pulse sequences of the thoracic and lumbar spine were obtained without intravenous contrast. COMPARISON:  Lumbar MRI 12/12/2020 FINDINGS: MRI THORACIC SPINE FINDINGS Alignment: Mild degenerative anterolisthesis at C7-T1 to T2-3. Mild anterolisthesis at T11-12 and retrolisthesis at T12-L1. Vertebrae: Interval but chronic appearing T12 compression fracture with sclerosis and advanced height loss, also seen on a December 2023 chest CT. Some retropulsion is present, but no neural compression. Reactive appearing edematous signal at the endplates of T11-12 and T12-L1. Remote and healed T3  superior endplate fracture. Cord:  Normal signal and morphology. Paraspinal and other soft tissues: No evidence of perispinal mass or inflammation. Disc levels: Limited degenerative change for age. The canal and foramina are diffusely patent. Truncated and motion degraded study. No sagittal T2 weighted imaging acquired. MRI LUMBAR SPINE FINDINGS Segmentation:  Standard. Alignment: Degenerative anterolisthesis at L3-4 and L4-5. Mild levoscoliosis Vertebrae: Reactive endplate edema at L1 superiorly. No acute fracture or aggressive bone lesion Conus medullaris and cauda equina: Conus extends to the L1 level. Conus and cauda equina appear normal. Paraspinal and other soft tissues: No perispinal mass or inflammation. Disc levels: T12- L1: Disc narrowing and bulging. Degenerative facet spurring on both sides. Mild to moderate biforaminal narrowing L1-L2: Unremarkable for age L2-L3: Disc collapse and endplate degeneration with endplate and facet spurring eccentric to the right. Mild right foraminal stenosis L3-L4: Degenerative facet spurring with mild anterolisthesis. The disc is bulging with central protrusion. High-grade thecal sac stenosis, stable or mildly progressed. The foramina are patent L4-L5: Disc collapse and endplate degeneration  with circumferential bulging and endplate ridging. Degenerative facet spurring on both sides. High-grade thecal sac stenosis. Right more than left foraminal narrowing with L4 root flattening. L5-S1:Disc collapse and endplate degeneration with disc bulging and ridging eccentric to the left. Chronic left foraminal protrusion. Left foraminal impingement. IMPRESSION: MR THORACIC SPINE IMPRESSION 1. Truncated and motion degraded exam. 2. No acute finding or neural impingement. 3. Remote T12 compression fracture with advanced central height loss and adjacent listhesis. There is reactive discogenic endplate edema at the adjacent disc spaces. 4. Mild for age thoracic spine degeneration. MR LUMBAR SPINE IMPRESSION 1. No acute finding.  No significant change when compared to 2022. 2. Advanced lumbar spine degeneration with mild scoliosis and levels of listhesis. 3. Compressive spinal stenosis at L3-4 and L4-5. 4. High-grade foraminal impingement bilaterally at L4-5 and on the left at L5-S1. Electronically Signed   By: Tiburcio Pea M.D.   On: 12/21/2022 04:37   MR THORACIC SPINE WO CONTRAST  Result Date: 12/21/2022 CLINICAL DATA:  Weakness.  History of neurogenic claudication EXAM: MRI THORACIC AND LUMBAR SPINE WITHOUT CONTRAST TECHNIQUE: Multiplanar and multiecho pulse sequences of the thoracic and lumbar spine were obtained without intravenous contrast. COMPARISON:  Lumbar MRI 12/12/2020 FINDINGS: MRI THORACIC SPINE FINDINGS Alignment: Mild degenerative anterolisthesis at C7-T1 to T2-3. Mild anterolisthesis at T11-12 and retrolisthesis at T12-L1. Vertebrae: Interval but chronic appearing T12 compression fracture with sclerosis and advanced height loss, also seen on a December 2023 chest CT. Some retropulsion is present, but no neural compression. Reactive appearing edematous signal at the endplates of T11-12 and T12-L1. Remote and healed T3 superior endplate fracture. Cord:  Normal signal and morphology. Paraspinal  and other soft tissues: No evidence of perispinal mass or inflammation. Disc levels: Limited degenerative change for age. The canal and foramina are diffusely patent. Truncated and motion degraded study. No sagittal T2 weighted imaging acquired. MRI LUMBAR SPINE FINDINGS Segmentation:  Standard. Alignment: Degenerative anterolisthesis at L3-4 and L4-5. Mild levoscoliosis Vertebrae: Reactive endplate edema at L1 superiorly. No acute fracture or aggressive bone lesion Conus medullaris and cauda equina: Conus extends to the L1 level. Conus and cauda equina appear normal. Paraspinal and other soft tissues: No perispinal mass or inflammation. Disc levels: T12- L1: Disc narrowing and bulging. Degenerative facet spurring on both sides. Mild to moderate biforaminal narrowing L1-L2: Unremarkable for age L2-L3: Disc collapse and endplate degeneration with endplate and facet spurring eccentric to the right. Mild right foraminal stenosis L3-L4:  Degenerative facet spurring with mild anterolisthesis. The disc is bulging with central protrusion. High-grade thecal sac stenosis, stable or mildly progressed. The foramina are patent L4-L5: Disc collapse and endplate degeneration with circumferential bulging and endplate ridging. Degenerative facet spurring on both sides. High-grade thecal sac stenosis. Right more than left foraminal narrowing with L4 root flattening. L5-S1:Disc collapse and endplate degeneration with disc bulging and ridging eccentric to the left. Chronic left foraminal protrusion. Left foraminal impingement. IMPRESSION: MR THORACIC SPINE IMPRESSION 1. Truncated and motion degraded exam. 2. No acute finding or neural impingement. 3. Remote T12 compression fracture with advanced central height loss and adjacent listhesis. There is reactive discogenic endplate edema at the adjacent disc spaces. 4. Mild for age thoracic spine degeneration. MR LUMBAR SPINE IMPRESSION 1. No acute finding.  No significant change when  compared to 2022. 2. Advanced lumbar spine degeneration with mild scoliosis and levels of listhesis. 3. Compressive spinal stenosis at L3-4 and L4-5. 4. High-grade foraminal impingement bilaterally at L4-5 and on the left at L5-S1. Electronically Signed   By: Tiburcio Pea M.D.   On: 12/21/2022 04:37   US Abdomen Limited RUQ (LIVER/GB)  Result Date: 12/20/2022 CLINICAL DATA:  Abdominal Pain EXAM: ULTRASOUND ABDOMEN LIMITED RIGHT UPPER QUADRANT COMPARISON:  None Available. FINDINGS: Gallbladder: No gallstones or wall thickening visualized. No sonographic Murphy sign noted by sonographer. Common bile duct: Diameter: 2 mm Liver: No focal lesion identified. Within normal limits in parenchymal echogenicity. Portal vein is patent on color Doppler imaging with normal direction of blood flow towards the liver. Other: None. IMPRESSION: No gallstones or duct dilatation. Electronically Signed   By: Karen Kays M.D.   On: 12/20/2022 17:50   CT Head Wo Contrast  Result Date: 12/19/2022 CLINICAL DATA:  Fall, trauma EXAM: CT HEAD WITHOUT CONTRAST CT CERVICAL SPINE WITHOUT CONTRAST TECHNIQUE: Multidetector CT imaging of the head and cervical spine was performed following the standard protocol without intravenous contrast. Multiplanar CT image reconstructions of the cervical spine were also generated. RADIATION DOSE REDUCTION: This exam was performed according to the departmental dose-optimization program which includes automated exposure control, adjustment of the mA and/or kV according to patient size and/or use of iterative reconstruction technique. COMPARISON:  CT head dated 03/12/2022 FINDINGS: CT HEAD FINDINGS Brain: No evidence of acute infarction, hemorrhage, hydrocephalus, extra-axial collection or mass lesion/mass effect. 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 Subcortical white matter and periventricular small vessel ischemic changes. Vascular: Intracranial atherosclerosis. Skull: Normal. Negative for  fracture or focal lesion. Sinuses/Orbits: The visualized paranasal sinuses are essentially clear. The mastoid air cells are unopacified. Other: None. CT CERVICAL SPINE FINDINGS Alignment: Normal cervical lordosis. Skull base and vertebrae: No acute fracture. No primary bone lesion or focal pathologic process. Soft tissues and spinal canal: No prevertebral fluid or swelling. No visible canal hematoma. Disc levels: Mild degenerative changes of the mid cervical spine. Spinal canal is patent. Upper chest: Visualized lung apices are clear. Other: Visualized thyroid is unremarkable. IMPRESSION: No acute intracranial abnormality. 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 Small vessel ischemic changes. No traumatic injury to the cervical spine. Mild degenerative changes. Electronically Signed   By: Charline Bills M.D.   On: 12/19/2022 22:18   CT Cervical Spine Wo Contrast  Result Date: 12/19/2022 CLINICAL DATA:  Fall, trauma EXAM: CT HEAD WITHOUT CONTRAST CT CERVICAL SPINE WITHOUT CONTRAST TECHNIQUE: Multidetector CT imaging of the head and cervical spine was performed following the standard protocol without intravenous contrast. Multiplanar CT image reconstructions of the cervical spine  were also generated. RADIATION DOSE REDUCTION: This exam was performed according to the departmental dose-optimization program which includes automated exposure control, adjustment of the mA and/or kV according to patient size and/or use of iterative reconstruction technique. COMPARISON:  CT head dated 03/12/2022 FINDINGS: CT HEAD FINDINGS Brain: No evidence of acute infarction, hemorrhage, hydrocephalus, extra-axial collection or mass lesion/mass effect. 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 Subcortical white matter and periventricular small vessel ischemic changes. Vascular: Intracranial atherosclerosis. Skull: Normal. Negative for fracture or focal lesion. Sinuses/Orbits: The visualized paranasal sinuses are essentially  clear. The mastoid air cells are unopacified. Other: None. CT CERVICAL SPINE FINDINGS Alignment: Normal cervical lordosis. Skull base and vertebrae: No acute fracture. No primary bone lesion or focal pathologic process. Soft tissues and spinal canal: No prevertebral fluid or swelling. No visible canal hematoma. Disc levels: Mild degenerative changes of the mid cervical spine. Spinal canal is patent. Upper chest: Visualized lung apices are clear. Other: Visualized thyroid is unremarkable. IMPRESSION: No acute intracranial abnormality. 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 Small vessel ischemic changes. No traumatic injury to the cervical spine. Mild degenerative changes. Electronically Signed   By: Charline Bills M.D.   On: 12/19/2022 22:18   DG Chest Port 1 View  Result Date: 12/19/2022 CLINICAL DATA:  Weakness recent fall, initial encounter EXAM: PORTABLE CHEST 1 VIEW COMPARISON:  09/05/2022 FINDINGS: Cardiac shadow is within normal limits. Pacing device is again seen. Aortic calcifications are noted. The lungs are clear bilaterally. No bony abnormality is noted. IMPRESSION: No acute abnormality noted. Electronically Signed   By: Alcide Clever M.D.   On: 12/19/2022 22:07    Assessment & Plan:  CKD (chronic kidney disease) stage 4, GFR 15-29 ml/min (HCC) -     Urinalysis, Routine w reflex microscopic; Future -     Microalbumin / creatinine urine ratio; Future  Type 2 diabetes mellitus with stage 3b chronic kidney disease, without long-term current use of insulin (HCC) -     Hemoglobin A1c; Future -     Urinalysis, Routine w reflex microscopic; Future -     Microalbumin / creatinine urine ratio; Future  Acute cystitis without hematuria -     Urinalysis, Routine w reflex microscopic; Future -     CULTURE, URINE COMPREHENSIVE; Future     Follow-up: Return in about 4 months (around 05/01/2023).  Sanda Linger, MD

## 2022-12-31 LAB — MICROALBUMIN / CREATININE URINE RATIO
Creatinine,U: 65.8 mg/dL
Microalb Creat Ratio: 8.3 mg/g (ref 0.0–30.0)
Microalb, Ur: 5.5 mg/dL — ABNORMAL HIGH (ref 0.0–1.9)

## 2022-12-31 LAB — URINALYSIS, ROUTINE W REFLEX MICROSCOPIC
Bilirubin Urine: NEGATIVE
Hgb urine dipstick: NEGATIVE
Ketones, ur: NEGATIVE
Leukocytes,Ua: NEGATIVE
Nitrite: NEGATIVE
Specific Gravity, Urine: 1.01 (ref 1.000–1.030)
Total Protein, Urine: NEGATIVE
Urine Glucose: >=1000 — AB
Urobilinogen, UA: 0.2 (ref 0.0–1.0)
pH: 7 (ref 5.0–8.0)

## 2022-12-31 NOTE — Addendum Note (Signed)
Addended by: Etta Grandchild on: 12/31/2022 03:20 PM   Modules accepted: Level of Service

## 2023-01-01 LAB — CULTURE, URINE COMPREHENSIVE: RESULT:: NO GROWTH

## 2023-01-13 ENCOUNTER — Other Ambulatory Visit: Payer: Self-pay | Admitting: Gastroenterology

## 2023-01-14 ENCOUNTER — Ambulatory Visit (INDEPENDENT_AMBULATORY_CARE_PROVIDER_SITE_OTHER): Payer: Medicare Other | Admitting: Internal Medicine

## 2023-01-14 ENCOUNTER — Encounter: Payer: Self-pay | Admitting: Internal Medicine

## 2023-01-14 VITALS — BP 122/76 | HR 76 | Temp 98.0°F | Ht 67.0 in | Wt 148.0 lb

## 2023-01-14 DIAGNOSIS — Z7984 Long term (current) use of oral hypoglycemic drugs: Secondary | ICD-10-CM | POA: Diagnosis not present

## 2023-01-14 DIAGNOSIS — N1832 Chronic kidney disease, stage 3b: Secondary | ICD-10-CM

## 2023-01-14 DIAGNOSIS — I10 Essential (primary) hypertension: Secondary | ICD-10-CM | POA: Diagnosis not present

## 2023-01-14 DIAGNOSIS — R8290 Unspecified abnormal findings in urine: Secondary | ICD-10-CM | POA: Insufficient documentation

## 2023-01-14 DIAGNOSIS — R32 Unspecified urinary incontinence: Secondary | ICD-10-CM | POA: Insufficient documentation

## 2023-01-14 DIAGNOSIS — E1122 Type 2 diabetes mellitus with diabetic chronic kidney disease: Secondary | ICD-10-CM | POA: Diagnosis not present

## 2023-01-14 DIAGNOSIS — N184 Chronic kidney disease, stage 4 (severe): Secondary | ICD-10-CM | POA: Diagnosis not present

## 2023-01-14 DIAGNOSIS — R21 Rash and other nonspecific skin eruption: Secondary | ICD-10-CM | POA: Diagnosis not present

## 2023-01-14 LAB — URINALYSIS, ROUTINE W REFLEX MICROSCOPIC
Bilirubin Urine: NEGATIVE
Hgb urine dipstick: NEGATIVE
Ketones, ur: NEGATIVE
Leukocytes,Ua: NEGATIVE
Nitrite: NEGATIVE
Specific Gravity, Urine: 1.01 (ref 1.000–1.030)
Total Protein, Urine: NEGATIVE
Urine Glucose: >=1000 — AB
Urobilinogen, UA: 0.2 (ref 0.0–1.0)
pH: 6 (ref 5.0–8.0)

## 2023-01-14 LAB — BASIC METABOLIC PANEL
BUN: 25 mg/dL — ABNORMAL HIGH (ref 6–23)
CO2: 28 meq/L (ref 19–32)
Calcium: 10 mg/dL (ref 8.4–10.5)
Chloride: 104 meq/L (ref 96–112)
Creatinine, Ser: 1.53 mg/dL — ABNORMAL HIGH (ref 0.40–1.20)
GFR: 30.03 mL/min — ABNORMAL LOW (ref >60.00)
Glucose, Bld: 119 mg/dL — ABNORMAL HIGH (ref 70–99)
Potassium: 4.2 meq/L (ref 3.5–5.1)
Sodium: 140 meq/L (ref 135–145)

## 2023-01-14 MED ORDER — CLOTRIMAZOLE-BETAMETHASONE 1-0.05 % EX CREA: 1.0000 | TOPICAL_CREAM | Freq: Every day | CUTANEOUS | 1 refills | Status: DC

## 2023-01-14 NOTE — Progress Notes (Unsigned)
Patient ID: Penny Miller, female   DOB: 11-09-1931, 89 y.o.   MRN: [id number]        Chief Complaint: follow up left upper back rash, rash under breasts, ckd3a, cloudy urine       HPI:  Penny Miller is a 89 y.o. female here with family with c/o itchy rash small multiple lesions to left upper back but also bilateral lower back as well, also has typical fungal rash mild under breasts, Also with 3 days more cloudy urine, but Denies urinary symptoms such as dysuria, frequency, urgency, flank pain, hematuria or n/v, fever, chills.  Pt denies chest pain, increased sob or doe, wheezing, orthopnea, PND, increased LE swelling, palpitations, dizziness or syncope.   Pt denies polydipsia, polyuria, or new focal neuro s/s.    Pt denies fever, wt loss, night sweats, loss of appetite, or other constitutional symptoms         Wt Readings from Last 3 Encounters:  01/14/23 148 lb (67.1 kg)  12/30/22 149 lb (67.6 kg)  12/20/22 151 lb 0.2 oz (68.5 kg)   BP Readings from Last 3 Encounters:  01/14/23 122/76  12/30/22 132/72  12/22/22 (!) 142/68         Past Medical History:  Diagnosis Date   Allergic rhinitis, cause unspecified    Anxiety state, unspecified    Arthritis    Colon polyp    Diaphragmatic hernia without mention of obstruction or gangrene    Diverticulosis of colon (without mention of hemorrhage)    Female stress incontinence    HTN (hypertension)    Insomnia, unspecified    Interstitial cystitis    surgery 02/01/2013   Irritable bowel syndrome    Memory loss    Other and unspecified hyperlipidemia    Other premature beats    Pacemaker 2024   Polyneuropathy in diabetes(357.2)    Postmenopausal bleeding    Proteinuria    Rectal ulcer    Skin cancer    Stroke (HCC) 2018   Type II or unspecified type diabetes mellitus with neurological manifestations, not stated as uncontrolled(250.60)    Unspecified essential hypertension    Viral warts, unspecified    Past Surgical History:   Procedure Laterality Date   ABDOMINAL HYSTERECTOMY  1977   Dr.Hambright   APPENDECTOMY  1965   CATARACT EXTRACTION, BILATERAL  02/10/2006   Dr.Groat    COLONOSCOPY  07/17/2004   polypectomy   CYSTOSCOPY  1987   Dr.Bradley   LOOP RECORDER INSERTION N/A 07/31/2016   Procedure: Loop Recorder Insertion;  Surgeon: Marinus Maw, MD;  Location: [facility];  Service: Cardiovascular;  Laterality: N/A;   LOOP RECORDER REMOVAL N/A 05/15/2022   Procedure: LOOP RECORDER REMOVAL;  Surgeon: Marinus Maw, MD;  Location: [facility];  Service: Cardiovascular;  Laterality: N/A;   PACEMAKER IMPLANT N/A 05/15/2022   Procedure: PACEMAKER IMPLANT;  Surgeon: Marinus Maw, MD;  Location: [facility];  Service: Cardiovascular;  Laterality: N/A;   TEE WITHOUT CARDIOVERSION N/A 07/30/2016   Procedure: TRANSESOPHAGEAL ECHOCARDIOGRAM (TEE);  Surgeon: Wendall Stade, MD;  Location: [facility] ENDOSCOPY;  Service: Cardiovascular;  Laterality: N/A;   TRIGGER FINGER RELEASE  1990   Dr.Carter   TRIGGER FINGER RELEASE  05/13/16   urinary bladder      surgery 02/01/2013 to stretch bladder stem    reports that she quit smoking about 11 years ago. Her smoking use included cigarettes. She started smoking about 73 years ago.  She has a 15.5 pack-year smoking history. She has never used smokeless tobacco. She reports that she does not drink alcohol and does not use drugs. family history includes Bladder Cancer in her father; COPD in her sister; Diabetes in her brother, brother, and sister; Heart attack in her brother and father; Heart disease in her brother; Heart disease (age of onset: 27) in her brother; Hypertension in her brother, sister, and son; Pancreatic cancer (age of onset: 31) in her mother. Allergies  Allergen Reactions   Adhesive [Tape] Rash and Other (See Comments)    Tears the skin also   Ceclor [Cefaclor] Other (See Comments)    Unknown reaction; not recalled   Codeine Nausea And Vomiting    Jardiance [Empagliflozin] Diarrhea   Lopid [Gemfibrozil] Other (See Comments)    Unknown reaction; not recalled   Metformin And Related Diarrhea   Metoprolol Nausea And Vomiting   Morphine And Codeine Other (See Comments)    Passed out   Niacin And Related Other (See Comments)    Unknown reaction   Relafen [Nabumetone] Nausea And Vomiting   Septra [Sulfamethoxazole-Trimethoprim] Nausea And Vomiting   Tetracyclines & Related Nausea And Vomiting   Current Outpatient Medications on File Prior to Visit  Medication Sig Dispense Refill   ACCU-CHEK AVIVA PLUS test strip USE TO TEST BLOOD SUGAR TWICE DAILY. 100 each 11   acetaminophen (TYLENOL) 500 MG tablet Take 500 mg by mouth every 6 (six) hours as needed (pain).     amLODipine (NORVASC) 2.5 MG tablet Take 2.5 mg by mouth at bedtime.     apixaban (ELIQUIS) 2.5 MG TABS tablet TAKE 1 TABLET TWICE A DAY 180 tablet 1   atorvastatin (LIPITOR) 40 MG tablet TAKE 1 TABLET AT BEDTIME 90 tablet 0   citalopram (CELEXA) 10 MG tablet TAKE 1 TABLET DAILY 90 tablet 1   dapagliflozin propanediol (FARXIGA) 10 MG TABS tablet TAKE 1 TABLET DAILY BEFORE BREAKFAST 90 tablet 0   famotidine (PEPCID) 20 MG tablet TAKE 1 TABLET ONCE TO TWICE A DAY 60 tablet 5   ferrous sulfate 325 (65 FE) MG EC tablet Take 325 mg by mouth daily with breakfast. With Vitamin C     gabapentin (NEURONTIN) 300 MG capsule TAKE 1 CAPSULE THREE TIMES A DAY 270 capsule 3   Lancets (ACCU-CHEK MULTICLIX) lancets Use to test blood sugar twice daily. E11.29 100 each 11   levocetirizine (XYZAL) 5 MG tablet TAKE ONE-HALF (1/2) TABLET (2.5 MG) EVERY OTHER DAY 23 tablet 3   Vitamin D, Ergocalciferol, (DRISDOL) 1.25 MG (50000 UNIT) CAPS capsule TAKE 1 CAPSULE EVERY 7 DAYS 12 capsule 0   No current facility-administered medications on file prior to visit.        ROS:  All others reviewed and negative.  Objective        PE:  BP 122/76 (BP Location: Left Arm, Patient Position: Sitting, Cuff Size:  Normal)   Pulse 76   Temp 98 F (36.7 C) (Oral)   Ht 5\' 7"  (1.702 m)   Wt 148 lb (67.1 kg)   SpO2 96%   BMI 23.18 kg/m                 Constitutional: Pt appears in NAD               HENT: Head: NCAT.                Right Ear: External ear normal.  Left Ear: External ear normal.                Eyes: . Pupils are equal, round, and reactive to light. Conjunctivae and EOM are normal               Nose: without d/c or deformity               Neck: Neck supple. Gross normal ROM               Cardiovascular: Normal rate and regular rhythm.                 Pulmonary/Chest: Effort normal and breath sounds without rales or wheezing.                Abd:  Soft, NT, ND, + BS, no organomegaly               Neurological: Pt is alert. At baseline orientation, motor grossly intact               Skin: Skin is warm. LE edema - none, left upper back with multiple itchy small lesions to left upper back, also typical fungal rash under breasts,                Psychiatric: Pt behavior is normal without agitation   Micro: none  Cardiac tracings I have personally interpreted today:  none  Pertinent Radiological findings (summarize): none   Lab Results  Component Value Date   WBC 7.0 12/22/2022   HGB 13.6 12/22/2022   HCT 40.7 12/22/2022   PLT 133 (L) 12/22/2022   GLUCOSE 119 (H) 01/14/2023   CHOL 166 05/28/2021   TRIG 192.0 (H) 05/28/2021   HDL 44.10 05/28/2021   LDLCALC 84 05/28/2021   ALT 11 12/19/2022   AST 16 12/19/2022   NA 140 01/14/2023   K 4.2 01/14/2023   CL 104 01/14/2023   CREATININE 1.53 (H) 01/14/2023   BUN 25 (H) 01/14/2023   CO2 28 01/14/2023   TSH 1.392 12/20/2022   INR 1.2 07/16/2021   HGBA1C 6.5 12/30/2022   MICROALBUR 5.5 (H) 12/30/2022   Assessment/Plan:  TOMICO GANNAWAY is a 89 y.o. White or Caucasian [1] female with  has a past medical history of Allergic rhinitis, cause unspecified, Anxiety state, unspecified, Arthritis, Colon polyp, Diaphragmatic  hernia without mention of obstruction or gangrene, Diverticulosis of colon (without mention of hemorrhage), Female stress incontinence, HTN (hypertension), Insomnia, unspecified, Interstitial cystitis, Irritable bowel syndrome, Memory loss, Other and unspecified hyperlipidemia, Other premature beats, Pacemaker (2024), Polyneuropathy in diabetes(357.2), Postmenopausal bleeding, Proteinuria, Rectal ulcer, Skin cancer, Stroke (HCC) (2018), Type II or 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  Abnormal urine Can't r/o uti - for ua and culture, and tx pending results  CKD (chronic kidney disease) stage 4, GFR 15-29 ml/min (HCC) Lab Results  Component Value Date   CREATININE 1.53 (H) 01/14/2023   Stable overall, cont to avoid nephrotoxins  DM (diabetes mellitus), type 2 with renal complications (HCC) Lab Results  Component Value Date   HGBA1C 6.5 12/30/2022   Stable, pt to continue current medical treatment farxiga 10 qd   Essential hypertension BP Readings from Last 3 Encounters:  01/14/23 122/76  12/30/22 132/72  12/22/22 (!) 142/68   Stable, pt to continue medical treatment norvasc 2;5 qd   Rash With dermatitis left upper back, also intertriginous below the breasts -  for lotrisone cr prn asd  Followup: Return if symptoms worsen or fail to improve.  Oliver Barre, MD 01/16/2023 8:59 PM [facility]

## 2023-01-14 NOTE — Patient Instructions (Signed)
Please take all new medication as prescribed - the cream for the rash to back and front  Please continue all other medications as before, and refills have been done if requested.  Please have the pharmacy call with any other refills you may need.  Please keep your appointments with your specialists as you may have planned  Please go to the LAB at the blood drawing area for the tests to be done  You will be contacted by phone if any changes need to be made immediately.  Otherwise, you will receive a letter about your results with an explanation, but please check with MyChart first.

## 2023-01-14 NOTE — Progress Notes (Signed)
The test results show that your current treatment is OK, as the tests are stable.  Please continue the same plan.  There is no other need for change of treatment or further evaluation based on these results, at this time.  thanks

## 2023-01-15 LAB — URINE CULTURE: Result:: NO GROWTH

## 2023-01-16 ENCOUNTER — Encounter: Payer: Self-pay | Admitting: Internal Medicine

## 2023-01-16 DIAGNOSIS — R21 Rash and other nonspecific skin eruption: Secondary | ICD-10-CM | POA: Insufficient documentation

## 2023-01-16 NOTE — Assessment & Plan Note (Signed)
Lab Results  Component Value Date   HGBA1C 6.5 12/30/2022   Stable, pt to continue current medical treatment farxiga 10 qd

## 2023-01-16 NOTE — Assessment & Plan Note (Signed)
BP Readings from Last 3 Encounters:  01/14/23 122/76  12/30/22 132/72  12/22/22 (!) 142/68   Stable, pt to continue medical treatment norvasc 2;5 qd

## 2023-01-16 NOTE — Assessment & Plan Note (Signed)
With dermatitis left upper back, also intertriginous below the breasts - for lotrisone cr prn asd

## 2023-01-16 NOTE — Assessment & Plan Note (Signed)
Can't r/o uti - for ua and culture, and tx pending results

## 2023-01-16 NOTE — Assessment & Plan Note (Signed)
Lab Results  Component Value Date   CREATININE 1.53 (H) 01/14/2023   Stable overall, cont to avoid nephrotoxins

## 2023-01-24 ENCOUNTER — Other Ambulatory Visit: Payer: Self-pay | Admitting: Internal Medicine

## 2023-02-11 ENCOUNTER — Other Ambulatory Visit: Payer: Self-pay | Admitting: Internal Medicine

## 2023-02-11 DIAGNOSIS — F325 Major depressive disorder, single episode, in full remission: Secondary | ICD-10-CM

## 2023-02-14 ENCOUNTER — Ambulatory Visit (INDEPENDENT_AMBULATORY_CARE_PROVIDER_SITE_OTHER): Payer: Medicare Other

## 2023-02-14 DIAGNOSIS — I495 Sick sinus syndrome: Secondary | ICD-10-CM | POA: Diagnosis not present

## 2023-02-15 LAB — CUP PACEART REMOTE DEVICE CHECK
Battery Remaining Longevity: 145 mo
Battery Voltage: 3.09 V
Brady Statistic AP VP Percent: 19.75 %
Brady Statistic AP VS Percent: 62.19 %
Brady Statistic AS VP Percent: 4.93 %
Brady Statistic AS VS Percent: 13.07 %
Brady Statistic RA Percent Paced: 68.78 %
Brady Statistic RV Percent Paced: 36.09 %
Date Time Interrogation Session: 20241115032920
Implantable Lead Connection Status: 753985
Implantable Lead Connection Status: 753985
Implantable Lead Implant Date: 20240214
Implantable Lead Implant Date: 20240214
Implantable Lead Location: 753859
Implantable Lead Location: 753860
Implantable Lead Model: 3830
Implantable Lead Model: 5076
Implantable Pulse Generator Implant Date: 20240214
Lead Channel Impedance Value: 304 Ohm
Lead Channel Impedance Value: 418 Ohm
Lead Channel Impedance Value: 456 Ohm
Lead Channel Impedance Value: 589 Ohm
Lead Channel Pacing Threshold Amplitude: 0.5 V
Lead Channel Pacing Threshold Amplitude: 0.875 V
Lead Channel Pacing Threshold Pulse Width: 0.4 ms
Lead Channel Pacing Threshold Pulse Width: 0.4 ms
Lead Channel Sensing Intrinsic Amplitude: 2.25 mV
Lead Channel Sensing Intrinsic Amplitude: 2.25 mV
Lead Channel Sensing Intrinsic Amplitude: 6.375 mV
Lead Channel Sensing Intrinsic Amplitude: 6.375 mV
Lead Channel Setting Pacing Amplitude: 1.75 V
Lead Channel Setting Pacing Amplitude: 2.5 V
Lead Channel Setting Pacing Pulse Width: 0.4 ms
Lead Channel Setting Sensing Sensitivity: 1.2 mV
Zone Setting Status: 755011

## 2023-02-19 ENCOUNTER — Telehealth: Payer: Self-pay | Admitting: Internal Medicine

## 2023-02-19 NOTE — Telephone Encounter (Signed)
Patient saw Dr. Jonny Ruiz in October and was referred to a urologist, but they haven't heard anything. They would like to get a call back at [phone].

## 2023-02-21 ENCOUNTER — Other Ambulatory Visit: Payer: Self-pay | Admitting: Internal Medicine

## 2023-02-21 ENCOUNTER — Ambulatory Visit: Payer: Medicare Other | Admitting: Gastroenterology

## 2023-02-21 DIAGNOSIS — N3020 Other chronic cystitis without hematuria: Secondary | ICD-10-CM

## 2023-02-21 NOTE — Telephone Encounter (Signed)
Can you please place her a referral to urology for her utis?

## 2023-02-21 NOTE — Telephone Encounter (Signed)
They are needing this referral to a urolologist ASAP

## 2023-02-25 ENCOUNTER — Ambulatory Visit: Payer: Medicare Other | Admitting: Podiatry

## 2023-02-25 DIAGNOSIS — R351 Nocturia: Secondary | ICD-10-CM | POA: Diagnosis not present

## 2023-02-25 DIAGNOSIS — N3946 Mixed incontinence: Secondary | ICD-10-CM | POA: Diagnosis not present

## 2023-02-25 DIAGNOSIS — R350 Frequency of micturition: Secondary | ICD-10-CM | POA: Diagnosis not present

## 2023-02-25 DIAGNOSIS — N3944 Nocturnal enuresis: Secondary | ICD-10-CM | POA: Diagnosis not present

## 2023-02-26 NOTE — Progress Notes (Signed)
Remote pacemaker transmission.   

## 2023-03-11 ENCOUNTER — Other Ambulatory Visit: Payer: Self-pay | Admitting: Internal Medicine

## 2023-03-11 DIAGNOSIS — E559 Vitamin D deficiency, unspecified: Secondary | ICD-10-CM

## 2023-03-12 ENCOUNTER — Ambulatory Visit (INDEPENDENT_AMBULATORY_CARE_PROVIDER_SITE_OTHER): Payer: Medicare Other | Admitting: Podiatry

## 2023-03-12 DIAGNOSIS — M79674 Pain in right toe(s): Secondary | ICD-10-CM | POA: Diagnosis not present

## 2023-03-12 DIAGNOSIS — B351 Tinea unguium: Secondary | ICD-10-CM

## 2023-03-12 DIAGNOSIS — M79675 Pain in left toe(s): Secondary | ICD-10-CM

## 2023-03-12 DIAGNOSIS — E1142 Type 2 diabetes mellitus with diabetic polyneuropathy: Secondary | ICD-10-CM | POA: Diagnosis not present

## 2023-03-12 NOTE — Progress Notes (Signed)
  Subjective:  Patient ID: Penny Miller, female    DOB: 02/28/1932,   MRN: [id number]  No chief complaint on file.   89 y.o. female presents for concern of thickened elongated and painful nails that are difficult to trim. Requesting to have them trimmed today. Relates burning and tingling in their feet. Patient is diabetic and last A1c was  Lab Results  Component Value Date   HGBA1C 6.5 12/30/2022   .   PCP:  Etta Grandchild, MD    . Denies any other pedal complaints. Denies n/v/f/c.   Past Medical History:  Diagnosis Date   Allergic rhinitis, cause unspecified    Anxiety state, unspecified    Arthritis    Colon polyp    Diaphragmatic hernia without mention of obstruction or gangrene    Diverticulosis of colon (without mention of hemorrhage)    Female stress incontinence    HTN (hypertension)    Insomnia, unspecified    Interstitial cystitis    surgery 02/01/2013   Irritable bowel syndrome    Memory loss    Other and unspecified hyperlipidemia    Other premature beats    Pacemaker 2024   Polyneuropathy in diabetes(357.2)    Postmenopausal bleeding    Proteinuria    Rectal ulcer    Skin cancer    Stroke (HCC) 2018   Type II or unspecified type diabetes mellitus with neurological manifestations, not stated as uncontrolled(250.60)    Unspecified essential hypertension    Viral warts, unspecified     Objective:  Physical Exam: Vascular: DP/PT pulses 2/4 bilateral. CFT <3 seconds. Absent hair growth on digits. Edema noted to bilateral lower extremities. Xerosis noted bilaterally.  Skin. No lacerations or abrasions bilateral feet. Nails 1-5 bilateral  are thickened discolored and elongated with subungual debris.  Musculoskeletal: MMT 5/5 bilateral lower extremities in DF, PF, Inversion and Eversion. Deceased ROM in DF of ankle joint.  Neurological: Sensation intact to light touch. Protective sensation diminished bilateral.    Assessment:   1. Pain due to onychomycosis  of toenails of both feet   2. Diabetic peripheral neuropathy (HCC)      Plan:  Patient was evaluated and treated and all questions answered. -Discussed and educated patient on diabetic foot care, especially with  regards to the vascular, neurological and musculoskeletal systems.  -Stressed the importance of good glycemic control and the detriment of not  controlling glucose levels in relation to the foot. -Discussed supportive shoes at all times and checking feet regularly.  -Mechanically debrided all nails 1-5 bilateral using sterile nail nipper and filed with dremel without incident  -Answered all patient questions -Patient to return  in 3 months for at risk foot care -Patient advised to call the office if any problems or questions arise in the meantime.   Louann Sjogren, DPM

## 2023-03-31 ENCOUNTER — Other Ambulatory Visit: Payer: Self-pay | Admitting: Internal Medicine

## 2023-03-31 DIAGNOSIS — E785 Hyperlipidemia, unspecified: Secondary | ICD-10-CM

## 2023-03-31 DIAGNOSIS — I6782 Cerebral ischemia: Secondary | ICD-10-CM

## 2023-04-03 ENCOUNTER — Telehealth: Payer: Self-pay | Admitting: Internal Medicine

## 2023-04-03 NOTE — Telephone Encounter (Signed)
 Copied from CRM [id number]. Topic: Clinical - Prescription Issue >> Apr 03, 2023 10:52 AM Antonio DEL wrote: Reason for CRM: Patient is needing a prescription for new blood glucose kit. Wanting the 1 touch ultra 2 meter, 1 touch ultra test strips, and the delica plus lancets 33gauge. Is this something PCP can write for her and [facility] [address]. Give her son a call at [phone] regarding this

## 2023-04-04 ENCOUNTER — Other Ambulatory Visit: Payer: Self-pay | Admitting: Internal Medicine

## 2023-04-04 DIAGNOSIS — N1832 Chronic kidney disease, stage 3b: Secondary | ICD-10-CM

## 2023-04-04 MED ORDER — ONETOUCH DELICA PLUS LANCET33G MISC: 1.0000 | Freq: Every day | 5 refills | Status: AC

## 2023-04-04 MED ORDER — ONETOUCH ULTRA 2 W/DEVICE KIT
1.0000 | PACK | Freq: Every day | 2 refills | Status: DC
Start: 2023-04-04 — End: 2023-04-10

## 2023-04-04 MED ORDER — GLUCOSE BLOOD VI STRP
1.0000 | ORAL_STRIP | Freq: Every day | 5 refills | Status: DC
Start: 2023-04-04 — End: 2023-04-21

## 2023-04-04 MED ORDER — ONETOUCH DELICA PLUS LANCET33G MISC
1.0000 | Freq: Two times a day (BID) | 5 refills | Status: DC
Start: 2023-04-04 — End: 2023-04-04

## 2023-04-04 MED ORDER — ONETOUCH ULTRA 2 W/DEVICE KIT: 1.0000 | PACK | Freq: Two times a day (BID) | 2 refills | Status: DC

## 2023-04-04 MED ORDER — GLUCOSE BLOOD VI STRP
1.0000 | ORAL_STRIP | Freq: Two times a day (BID) | 5 refills | Status: DC
Start: 2023-04-04 — End: 2023-04-04

## 2023-04-04 NOTE — Telephone Encounter (Signed)
 Can you write this RX for her ?

## 2023-04-10 ENCOUNTER — Other Ambulatory Visit: Payer: Self-pay | Admitting: Internal Medicine

## 2023-04-10 ENCOUNTER — Other Ambulatory Visit: Payer: Self-pay

## 2023-04-10 DIAGNOSIS — E1122 Type 2 diabetes mellitus with diabetic chronic kidney disease: Secondary | ICD-10-CM

## 2023-04-10 DIAGNOSIS — N3946 Mixed incontinence: Secondary | ICD-10-CM | POA: Diagnosis not present

## 2023-04-10 DIAGNOSIS — N3000 Acute cystitis without hematuria: Secondary | ICD-10-CM | POA: Diagnosis not present

## 2023-04-10 DIAGNOSIS — N390 Urinary tract infection, site not specified: Secondary | ICD-10-CM | POA: Diagnosis not present

## 2023-04-10 MED ORDER — ONETOUCH ULTRA 2 W/DEVICE KIT: 1.0000 | PACK | Freq: Every day | 2 refills | Status: AC

## 2023-04-10 NOTE — Telephone Encounter (Signed)
 Received message regarding need to resend to another pharmacy. [facility]

## 2023-04-10 NOTE — Telephone Encounter (Signed)
 Copied from CRM [id number]. Topic: Clinical - Medication Refill >> Apr 10, 2023  8:32 AM Leotis ORN wrote: Most Recent Primary Care Visit:  Provider: NORLEEN LYNWOOD ORN  Department: [facility] [place]  Visit Type: OFFICE VISIT  Date: 01/14/2023  Medication: Blood Glucose Monitoring Suppl (ONE TOUCH ULTRA 2) w/Device KIT  Has the patient contacted their pharmacy? Yes (Agent: If no, request that the patient contact the pharmacy for the refill. If patient does not wish to contact the pharmacy document the reason why and proceed with request.) (Agent: If yes, when and what did the pharmacy advise?)  Is this the correct pharmacy for this prescription? Yes If no, delete pharmacy and type the correct one.  This is the patient's preferred pharmacy:     [facility] - [place], [place] - [address] [place] [address] Phone: [phone] Fax: [phone]   Has the prescription been filled recently? Yes  Is the patient out of the medication? Yes  Has the patient been seen for an appointment in the last year OR does the patient have an upcoming appointment? Yes  Can we respond through MyChart? Yes  Agent: Please be advised that Rx refills may take up to 3 business days. We ask that you follow-up with your pharmacy.

## 2023-04-21 ENCOUNTER — Other Ambulatory Visit: Payer: Self-pay | Admitting: Internal Medicine

## 2023-04-21 DIAGNOSIS — N1832 Chronic kidney disease, stage 3b: Secondary | ICD-10-CM

## 2023-04-22 MED ORDER — GLUCOSE BLOOD VI STRP: 1.0000 | ORAL_STRIP | Freq: Every day | 5 refills | Status: DC

## 2023-04-23 ENCOUNTER — Other Ambulatory Visit: Payer: Self-pay | Admitting: Internal Medicine

## 2023-04-23 ENCOUNTER — Other Ambulatory Visit: Payer: Self-pay | Admitting: Family Medicine

## 2023-04-23 DIAGNOSIS — I480 Paroxysmal atrial fibrillation: Secondary | ICD-10-CM

## 2023-04-23 DIAGNOSIS — E559 Vitamin D deficiency, unspecified: Secondary | ICD-10-CM

## 2023-04-23 NOTE — Telephone Encounter (Signed)
Copied from CRM [id number]. Topic: Clinical - Medication Refill >> Apr 23, 2023  3:32 PM Donita Brooks wrote: Most Recent Primary Care Visit:  Provider: Corwin Levins  Department: [facility]  Visit Type: OFFICE VISIT  Date: 01/14/2023  Medication: apixaban (ELIQUIS) 2.5 MG TAB  Has the patient contacted their pharmacy? Yes (Agent: If no, request that the patient contact the pharmacy for the refill. If patient does not wish to contact the pharmacy document the reason why and proceed with request.) (Agent: If yes, when and what did the pharmacy advise?)  Is this the correct pharmacy for this prescription? Yes If no, delete pharmacy and type the correct one.  This is the patient's preferred pharmacy:   [facility] [address] [place] - [address] [place] [address] Phone: [phone] Fax: [phone]   Has the prescription been filled recently? No  Is the patient out of the medication? Yes  Has the patient been seen for an appointment in the last year OR does the patient have an upcoming appointment? No  Can we respond through MyChart? Yes  Agent: Please be advised that Rx refills may take up to 3 business days. We ask that you follow-up with your pharmacy.

## 2023-04-24 MED ORDER — APIXABAN 2.5 MG PO TABS
2.5000 mg | ORAL_TABLET | Freq: Two times a day (BID) | ORAL | 0 refills | Status: DC
Start: 2023-04-24 — End: 2023-08-29

## 2023-04-25 ENCOUNTER — Telehealth: Payer: Self-pay | Admitting: Internal Medicine

## 2023-04-25 MED ORDER — APIXABAN 2.5 MG PO TABS
2.5000 mg | ORAL_TABLET | Freq: Two times a day (BID) | ORAL | 0 refills | Status: DC
Start: 2023-04-25 — End: 2023-05-02

## 2023-04-25 NOTE — Telephone Encounter (Signed)
Unable to reach patients son, [facility]. Patient doesn't have a daughter listed on file.

## 2023-04-25 NOTE — Telephone Encounter (Signed)
Patient has had delayed delivered of apixaban and she has run out of it. Needs a 5-day Rx to bridge. [facility] [place] [facility] son's request.   Gerre Scull MD  Cardiology

## 2023-04-25 NOTE — Telephone Encounter (Signed)
Copied from CRM [id number]. Topic: Clinical - Prescription Issue >> Apr 25, 2023 11:02 AM Penny Miller wrote: Reason for CRM: Patients daughter is requesting an update of if  Dr. Yetta Barre has sent the apixaban (ELIQUIS) 2.5 MG TAB [facility] as [facility] is [facility] of this [facility].

## 2023-04-30 DIAGNOSIS — D23121 Other benign neoplasm of skin of left upper eyelid, including canthus: Secondary | ICD-10-CM | POA: Diagnosis not present

## 2023-04-30 DIAGNOSIS — H04123 Dry eye syndrome of bilateral lacrimal glands: Secondary | ICD-10-CM | POA: Diagnosis not present

## 2023-05-02 ENCOUNTER — Other Ambulatory Visit: Payer: Self-pay

## 2023-05-02 ENCOUNTER — Emergency Department (HOSPITAL_COMMUNITY): Payer: Medicare Other

## 2023-05-02 ENCOUNTER — Encounter (HOSPITAL_COMMUNITY): Payer: Self-pay

## 2023-05-02 ENCOUNTER — Observation Stay (HOSPITAL_COMMUNITY)
Admission: EM | Admit: 2023-05-02 | Discharge: 2023-05-04 | Disposition: A | Discharge status: Home / Self Care | Payer: Medicare Other | Attending: Family Medicine | Admitting: Family Medicine

## 2023-05-02 DIAGNOSIS — Z85828 Personal history of other malignant neoplasm of skin: Secondary | ICD-10-CM | POA: Diagnosis not present

## 2023-05-02 DIAGNOSIS — R6889 Other general symptoms and signs: Secondary | ICD-10-CM | POA: Diagnosis not present

## 2023-05-02 DIAGNOSIS — E039 Hypothyroidism, unspecified: Secondary | ICD-10-CM | POA: Diagnosis not present

## 2023-05-02 DIAGNOSIS — Z7901 Long term (current) use of anticoagulants: Secondary | ICD-10-CM | POA: Insufficient documentation

## 2023-05-02 DIAGNOSIS — E1122 Type 2 diabetes mellitus with diabetic chronic kidney disease: Secondary | ICD-10-CM | POA: Diagnosis not present

## 2023-05-02 DIAGNOSIS — Z79899 Other long term (current) drug therapy: Secondary | ICD-10-CM | POA: Diagnosis not present

## 2023-05-02 DIAGNOSIS — D631 Anemia in chronic kidney disease: Secondary | ICD-10-CM | POA: Insufficient documentation

## 2023-05-02 DIAGNOSIS — D500 Iron deficiency anemia secondary to blood loss (chronic): Secondary | ICD-10-CM | POA: Diagnosis present

## 2023-05-02 DIAGNOSIS — D538 Other specified nutritional anemias: Secondary | ICD-10-CM | POA: Diagnosis not present

## 2023-05-02 DIAGNOSIS — U071 COVID-19: Principal | ICD-10-CM | POA: Insufficient documentation

## 2023-05-02 DIAGNOSIS — R0602 Shortness of breath: Secondary | ICD-10-CM | POA: Diagnosis not present

## 2023-05-02 DIAGNOSIS — M48062 Spinal stenosis, lumbar region with neurogenic claudication: Secondary | ICD-10-CM | POA: Diagnosis not present

## 2023-05-02 DIAGNOSIS — J45901 Unspecified asthma with (acute) exacerbation: Secondary | ICD-10-CM | POA: Diagnosis not present

## 2023-05-02 DIAGNOSIS — R0689 Other abnormalities of breathing: Secondary | ICD-10-CM | POA: Diagnosis not present

## 2023-05-02 DIAGNOSIS — N1832 Chronic kidney disease, stage 3b: Secondary | ICD-10-CM

## 2023-05-02 DIAGNOSIS — Z950 Presence of cardiac pacemaker: Secondary | ICD-10-CM | POA: Diagnosis present

## 2023-05-02 DIAGNOSIS — J441 Chronic obstructive pulmonary disease with (acute) exacerbation: Secondary | ICD-10-CM | POA: Diagnosis not present

## 2023-05-02 DIAGNOSIS — Z8673 Personal history of transient ischemic attack (TIA), and cerebral infarction without residual deficits: Secondary | ICD-10-CM | POA: Diagnosis not present

## 2023-05-02 DIAGNOSIS — X58XXXA Exposure to other specified factors, initial encounter: Secondary | ICD-10-CM | POA: Diagnosis not present

## 2023-05-02 DIAGNOSIS — J9601 Acute respiratory failure with hypoxia: Secondary | ICD-10-CM | POA: Diagnosis present

## 2023-05-02 DIAGNOSIS — R918 Other nonspecific abnormal finding of lung field: Secondary | ICD-10-CM | POA: Diagnosis not present

## 2023-05-02 DIAGNOSIS — I10 Essential (primary) hypertension: Secondary | ICD-10-CM | POA: Diagnosis present

## 2023-05-02 DIAGNOSIS — J101 Influenza due to other identified influenza virus with other respiratory manifestations: Secondary | ICD-10-CM | POA: Insufficient documentation

## 2023-05-02 DIAGNOSIS — Z87891 Personal history of nicotine dependence: Secondary | ICD-10-CM | POA: Insufficient documentation

## 2023-05-02 DIAGNOSIS — E1129 Type 2 diabetes mellitus with other diabetic kidney complication: Secondary | ICD-10-CM | POA: Diagnosis present

## 2023-05-02 DIAGNOSIS — N3281 Overactive bladder: Secondary | ICD-10-CM | POA: Diagnosis present

## 2023-05-02 DIAGNOSIS — J9621 Acute and chronic respiratory failure with hypoxia: Secondary | ICD-10-CM

## 2023-05-02 DIAGNOSIS — N390 Urinary tract infection, site not specified: Secondary | ICD-10-CM | POA: Insufficient documentation

## 2023-05-02 DIAGNOSIS — I480 Paroxysmal atrial fibrillation: Secondary | ICD-10-CM | POA: Diagnosis present

## 2023-05-02 DIAGNOSIS — N184 Chronic kidney disease, stage 4 (severe): Secondary | ICD-10-CM | POA: Diagnosis not present

## 2023-05-02 DIAGNOSIS — R062 Wheezing: Secondary | ICD-10-CM | POA: Diagnosis not present

## 2023-05-02 DIAGNOSIS — I499 Cardiac arrhythmia, unspecified: Secondary | ICD-10-CM | POA: Diagnosis not present

## 2023-05-02 DIAGNOSIS — E1142 Type 2 diabetes mellitus with diabetic polyneuropathy: Secondary | ICD-10-CM | POA: Diagnosis not present

## 2023-05-02 DIAGNOSIS — Z743 Need for continuous supervision: Secondary | ICD-10-CM | POA: Diagnosis not present

## 2023-05-02 DIAGNOSIS — S22080A Wedge compression fracture of T11-T12 vertebra, initial encounter for closed fracture: Secondary | ICD-10-CM | POA: Diagnosis not present

## 2023-05-02 DIAGNOSIS — E519 Thiamine deficiency, unspecified: Secondary | ICD-10-CM | POA: Diagnosis present

## 2023-05-02 LAB — I-STAT CG4 LACTIC ACID, ED
Lactic Acid, Venous: 2.3 mmol/L (ref 0.5–1.9)
Lactic Acid, Venous: 4.4 mmol/L (ref 0.5–1.9)

## 2023-05-02 LAB — RESP PANEL BY RT-PCR (RSV, FLU A&B, COVID)  RVPGX2
Influenza A by PCR: POSITIVE — AB
Influenza B by PCR: NEGATIVE
Resp Syncytial Virus by PCR: NEGATIVE
SARS Coronavirus 2 by RT PCR: NEGATIVE

## 2023-05-02 LAB — CBC WITH DIFFERENTIAL/PLATELET
Abs Immature Granulocytes: 0.01 10*3/uL (ref 0.00–0.07)
Basophils Absolute: 0 10*3/uL (ref 0.0–0.1)
Basophils Relative: 1 %
Eosinophils Absolute: 0.1 10*3/uL (ref 0.0–0.5)
Eosinophils Relative: 1 %
HCT: 45 % (ref 36.0–46.0)
Hemoglobin: 14.6 g/dL (ref 12.0–15.0)
Immature Granulocytes: 0 %
Lymphocytes Relative: 15 %
Lymphs Abs: 1.3 10*3/uL (ref 0.7–4.0)
MCH: 31.8 pg (ref 26.0–34.0)
MCHC: 32.4 g/dL (ref 30.0–36.0)
MCV: 98 fL (ref 80.0–100.0)
Monocytes Absolute: 0.7 10*3/uL (ref 0.1–1.0)
Monocytes Relative: 8 %
Neutro Abs: 6.5 10*3/uL (ref 1.7–7.7)
Neutrophils Relative %: 75 %
Platelets: 133 10*3/uL — ABNORMAL LOW (ref 150–400)
RBC: 4.59 MIL/uL (ref 3.87–5.11)
RDW: 14.2 % (ref 11.5–15.5)
WBC: 8.5 10*3/uL (ref 4.0–10.5)
nRBC: 0 % (ref 0.0–0.2)

## 2023-05-02 LAB — COMPREHENSIVE METABOLIC PANEL
ALT: 15 U/L (ref 0–44)
AST: 24 U/L (ref 15–41)
Albumin: 3.8 g/dL (ref 3.5–5.0)
Alkaline Phosphatase: 79 U/L (ref 38–126)
Anion gap: 15 (ref 5–15)
BUN: 21 mg/dL (ref 8–23)
CO2: 18 mmol/L — ABNORMAL LOW (ref 22–32)
Calcium: 9.4 mg/dL (ref 8.9–10.3)
Chloride: 102 mmol/L (ref 98–111)
Creatinine, Ser: 1.63 mg/dL — ABNORMAL HIGH (ref 0.44–1.00)
GFR, Estimated: 30 mL/min — ABNORMAL LOW (ref >60)
Glucose, Bld: 132 mg/dL — ABNORMAL HIGH (ref 70–99)
Potassium: 4.2 mmol/L (ref 3.5–5.1)
Sodium: 135 mmol/L (ref 135–145)
Total Bilirubin: 0.6 mg/dL (ref 0.0–1.2)
Total Protein: 6.7 g/dL (ref 6.5–8.1)

## 2023-05-02 LAB — GLUCOSE, CAPILLARY: Glucose-Capillary: 253 mg/dL — ABNORMAL HIGH (ref 70–99)

## 2023-05-02 LAB — MAGNESIUM: Magnesium: 2 mg/dL (ref 1.7–2.4)

## 2023-05-02 LAB — TROPONIN I (HIGH SENSITIVITY)
Troponin I (High Sensitivity): 48 ng/L — ABNORMAL HIGH (ref <18)
Troponin I (High Sensitivity): 52 ng/L — ABNORMAL HIGH (ref <18)

## 2023-05-02 LAB — LACTIC ACID, PLASMA: Lactic Acid, Venous: 4.6 mmol/L (ref 0.5–1.9)

## 2023-05-02 MED ORDER — AMLODIPINE BESYLATE 5 MG PO TABS
2.5000 mg | ORAL_TABLET | Freq: Every day | ORAL | Status: DC
Start: 2023-05-03 — End: 2023-05-04
  Administered 2023-05-03: 2.5 mg via ORAL
  Filled 2023-05-02: qty 1

## 2023-05-02 MED ORDER — MAGNESIUM SULFATE 2 GM/50ML IV SOLN
2.0000 g | Freq: Once | INTRAVENOUS | Status: AC
  Administered 2023-05-02: 2 g via INTRAVENOUS
  Filled 2023-05-02: qty 50

## 2023-05-02 MED ORDER — ATORVASTATIN CALCIUM 40 MG PO TABS
40.0000 mg | ORAL_TABLET | Freq: Every day | ORAL | Status: DC
  Administered 2023-05-02 – 2023-05-03 (×2): 40 mg via ORAL
  Filled 2023-05-02 (×2): qty 1

## 2023-05-02 MED ORDER — METHYLPREDNISOLONE SODIUM SUCC 125 MG IJ SOLR
125.0000 mg | Freq: Once | INTRAMUSCULAR | Status: AC
  Administered 2023-05-02: 125 mg via INTRAVENOUS
  Filled 2023-05-02: qty 2

## 2023-05-02 MED ORDER — IPRATROPIUM-ALBUTEROL 0.5-2.5 (3) MG/3ML IN SOLN
3.0000 mL | Freq: Once | RESPIRATORY_TRACT | Status: AC
  Administered 2023-05-02: 3 mL via RESPIRATORY_TRACT
  Filled 2023-05-02: qty 3

## 2023-05-02 MED ORDER — SODIUM CHLORIDE 0.9% FLUSH
3.0000 mL | Freq: Two times a day (BID) | INTRAVENOUS | Status: DC
  Administered 2023-05-02 – 2023-05-04 (×5): 3 mL via INTRAVENOUS

## 2023-05-02 MED ORDER — SODIUM CHLORIDE 0.9 % IV SOLN
1.0000 g | Freq: Once | INTRAVENOUS | Status: AC
  Administered 2023-05-02: 1 g via INTRAVENOUS
  Filled 2023-05-02: qty 10

## 2023-05-02 MED ORDER — FAMOTIDINE 20 MG PO TABS
20.0000 mg | ORAL_TABLET | ORAL | Status: DC
  Administered 2023-05-03: 20 mg via ORAL
  Filled 2023-05-02: qty 1

## 2023-05-02 MED ORDER — ACETAMINOPHEN 325 MG PO TABS
650.0000 mg | ORAL_TABLET | Freq: Four times a day (QID) | ORAL | Status: DC | PRN
  Administered 2023-05-04: 650 mg via ORAL
  Filled 2023-05-02: qty 2

## 2023-05-02 MED ORDER — TRIMETHOPRIM 100 MG PO TABS
100.0000 mg | ORAL_TABLET | Freq: Every day | ORAL | Status: DC
  Administered 2023-05-03: 100 mg via ORAL
  Filled 2023-05-02 (×2): qty 1

## 2023-05-02 MED ORDER — CITALOPRAM HYDROBROMIDE 20 MG PO TABS
10.0000 mg | ORAL_TABLET | Freq: Every day | ORAL | Status: DC
  Administered 2023-05-03 – 2023-05-04 (×2): 10 mg via ORAL
  Filled 2023-05-02 (×2): qty 1

## 2023-05-02 MED ORDER — GABAPENTIN 300 MG PO CAPS
300.0000 mg | ORAL_CAPSULE | Freq: Three times a day (TID) | ORAL | Status: DC
  Administered 2023-05-02 – 2023-05-04 (×5): 300 mg via ORAL
  Filled 2023-05-02 (×5): qty 1

## 2023-05-02 MED ORDER — ALBUTEROL SULFATE (2.5 MG/3ML) 0.083% IN NEBU: 2.5000 mg | INHALATION_SOLUTION | RESPIRATORY_TRACT | Status: DC | PRN

## 2023-05-02 MED ORDER — OSELTAMIVIR PHOSPHATE 30 MG PO CAPS
30.0000 mg | ORAL_CAPSULE | Freq: Once | ORAL | Status: AC
  Administered 2023-05-02: 30 mg via ORAL
  Filled 2023-05-02 (×2): qty 1

## 2023-05-02 MED ORDER — POLYETHYLENE GLYCOL 3350 17 G PO PACK
17.0000 g | PACK | Freq: Every day | ORAL | Status: DC | PRN
Start: 2023-05-02 — End: 2023-05-04

## 2023-05-02 MED ORDER — SODIUM CHLORIDE 0.9 % IV SOLN
500.0000 mg | Freq: Once | INTRAVENOUS | Status: AC
  Administered 2023-05-02: 500 mg via INTRAVENOUS
  Filled 2023-05-02: qty 5

## 2023-05-02 MED ORDER — PREDNISONE 50 MG PO TABS
50.0000 mg | ORAL_TABLET | Freq: Every day | ORAL | Status: DC
  Administered 2023-05-03 – 2023-05-04 (×2): 50 mg via ORAL
  Filled 2023-05-02 (×2): qty 1

## 2023-05-02 MED ORDER — ACETAMINOPHEN 650 MG RE SUPP: 650.0000 mg | Freq: Four times a day (QID) | RECTAL | Status: DC | PRN

## 2023-05-02 MED ORDER — IPRATROPIUM BROMIDE 0.02 % IN SOLN
0.5000 mg | Freq: Four times a day (QID) | RESPIRATORY_TRACT | Status: DC
  Administered 2023-05-03 – 2023-05-04 (×4): 0.5 mg via RESPIRATORY_TRACT
  Filled 2023-05-02 (×4): qty 2.5

## 2023-05-02 MED ORDER — APIXABAN 2.5 MG PO TABS
2.5000 mg | ORAL_TABLET | Freq: Two times a day (BID) | ORAL | Status: DC
Start: 2023-05-02 — End: 2023-05-04
  Administered 2023-05-02 – 2023-05-04 (×4): 2.5 mg via ORAL
  Filled 2023-05-02 (×4): qty 1

## 2023-05-02 MED ORDER — ACETAMINOPHEN 325 MG PO TABS
650.0000 mg | ORAL_TABLET | Freq: Once | ORAL | Status: AC
  Administered 2023-05-02: 650 mg via ORAL
  Filled 2023-05-02: qty 2

## 2023-05-02 MED ORDER — INSULIN ASPART 100 UNIT/ML IJ SOLN
0.0000 [IU] | Freq: Three times a day (TID) | INTRAMUSCULAR | Status: DC
  Administered 2023-05-02: 3 [IU] via SUBCUTANEOUS
  Administered 2023-05-03 (×2): 2 [IU] via SUBCUTANEOUS

## 2023-05-02 MED ORDER — LACTATED RINGERS IV BOLUS
500.0000 mL | Freq: Once | INTRAVENOUS | Status: AC
  Administered 2023-05-02: 500 mL via INTRAVENOUS

## 2023-05-02 MED ORDER — OSELTAMIVIR PHOSPHATE 30 MG PO CAPS
30.0000 mg | ORAL_CAPSULE | Freq: Every day | ORAL | Status: DC
  Administered 2023-05-03 – 2023-05-04 (×2): 30 mg via ORAL
  Filled 2023-05-02 (×2): qty 1

## 2023-05-02 NOTE — H&P (Addendum)
History and Physical   KHIA DIETERICH ZOX:[id number] DOB: [birth year] DOA: 05/02/2023  PCP: Etta Grandchild, MD   Patient coming from: Home  Chief Complaint: Shortness of breath  HPI: Penny Miller is a 89 y.o. female with medical history significant of hypertension, hypothyroidism, CKD 4, asthma, anemia, OAB, diabetes, neuropathy, atrial fibrillation, CVA, spinal stenosis, T12 compression fracture presenting with shortness of breath.  Patient reports 1 week of worsening shortness of breath.  Went to the fire department today as she is feeling so poorly and was found to be saturating 70% on room air.  She was further evaluated and found to be saturating 96% on what during a DuoNeb and then return to 89% on 4 L.  Does report ongoing cough and recent antibiotic course for UTI but denies any fever or known sick contacts.  Further denies chills, chest pain, abdominal pain, constipation, diarrhea, nausea, vomiting.  ED Course: Vital signs in the ED notable for temperature of 99.9, blood pressure of 130s to 150s systolic, requiring 3 L to maintain saturations.  Lab workup included BMP with bicarb 18, creatinine stable 1.63, glucose 132.  CBC with platelets stable at 133.  Magnesium normal.  Troponin 48 with repeat pending.  Lactic acid 2.3 with repeat pending.  Respiratory panel positive for flu A.  Blood cultures pending.  Chest x-ray showed left midlung opacity consistent with atelectasis versus scarring and no focal consolidations.  Patient received ceftriaxone, azithromycin, Tamiflu, Solu-Medrol, magnesium, DuoNeb, Tylenol in the ED.  Review of Systems: As per HPI otherwise all other systems reviewed and are negative.  Past Medical History:  Diagnosis Date   Allergic rhinitis, cause unspecified    Anxiety state, unspecified    Arthritis    Colon polyp    Diaphragmatic hernia without mention of obstruction or gangrene    Diverticulosis of colon (without mention of hemorrhage)    Female stress  incontinence    HTN (hypertension)    Insomnia, unspecified    Interstitial cystitis    surgery 02/01/2013   Irritable bowel syndrome    Memory loss    Other and unspecified hyperlipidemia    Other premature beats    Pacemaker 2024   Polyneuropathy in diabetes(357.2)    Postmenopausal bleeding    Proteinuria    Rectal ulcer    Skin cancer    Stroke (HCC) 2018   Type II or unspecified type diabetes mellitus with neurological manifestations, not stated as uncontrolled(250.60)    Unspecified essential hypertension    Viral warts, unspecified     Past Surgical History:  Procedure Laterality Date   ABDOMINAL HYSTERECTOMY  1977   Dr.Hambright   APPENDECTOMY  1965   CATARACT EXTRACTION, BILATERAL  02/10/2006   Dr.Groat    COLONOSCOPY  07/17/2004   polypectomy   CYSTOSCOPY  1987   Dr.Bradley   LOOP RECORDER INSERTION N/A 07/31/2016   Procedure: Loop Recorder Insertion;  Surgeon: Marinus Maw, MD;  Location: [facility];  Service: Cardiovascular;  Laterality: N/A;   LOOP RECORDER REMOVAL N/A 05/15/2022   Procedure: LOOP RECORDER REMOVAL;  Surgeon: Marinus Maw, MD;  Location: [facility];  Service: Cardiovascular;  Laterality: N/A;   PACEMAKER IMPLANT N/A 05/15/2022   Procedure: PACEMAKER IMPLANT;  Surgeon: Marinus Maw, MD;  Location: [facility];  Service: Cardiovascular;  Laterality: N/A;   TEE WITHOUT CARDIOVERSION N/A 07/30/2016   Procedure: TRANSESOPHAGEAL ECHOCARDIOGRAM (TEE);  Surgeon: Wendall Stade, MD;  Location: [facility]  ENDOSCOPY;  Service: Cardiovascular;  Laterality: N/A;   TRIGGER FINGER RELEASE  1990   Dr.Carter   TRIGGER FINGER RELEASE  05/13/16   urinary bladder      surgery 02/01/2013 to stretch bladder stem    Social History  reports that she quit smoking about 12 years ago. Her smoking use included cigarettes. She started smoking about 74 years ago. She has a 15.5 pack-year smoking history. She has never used smokeless tobacco. She reports  that she does not drink alcohol and does not use drugs.  Allergies  Allergen Reactions   Adhesive [Tape] Rash and Other (See Comments)    Tears the skin also   Ceclor [Cefaclor] Other (See Comments)    Unknown reaction; not recalled   Codeine Nausea And Vomiting   Jardiance [Empagliflozin] Diarrhea   Lopid [Gemfibrozil] Other (See Comments)    Unknown reaction; not recalled   Metformin And Related Diarrhea   Metoprolol Nausea And Vomiting   Morphine And Codeine Other (See Comments)    Passed out   Niacin And Related Other (See Comments)    Unknown reaction   Relafen [Nabumetone] Nausea And Vomiting   Septra [Sulfamethoxazole-Trimethoprim] Nausea And Vomiting   Tetracyclines & Related Nausea And Vomiting    Family History  Problem Relation Age of Onset   Pancreatic cancer Mother 86   Heart attack Father    Bladder Cancer Father    Hypertension Sister    Diabetes Sister    COPD Sister    Hypertension Son    Diabetes Brother    Heart disease Brother    Hypertension Brother    Diabetes Brother    Heart disease Brother 58       Myocardial Infarction    Heart attack Brother   Reviewed on admission  Prior to Admission medications   Medication Sig Start Date End Date Taking? Authorizing Provider  acetaminophen (TYLENOL) 500 MG tablet Take 500 mg by mouth every 6 (six) hours as needed (pain).   Yes [provider]  amLODipine (NORVASC) 2.5 MG tablet Take 2.5 mg by mouth at bedtime. 09/06/21  Yes [provider]  apixaban (ELIQUIS) 2.5 MG TABS tablet Take 1 tablet (2.5 mg total) by mouth 2 (two) times daily. 04/24/23  Yes Etta Grandchild, MD  atorvastatin (LIPITOR) 40 MG tablet TAKE 1 TABLET AT BEDTIME 04/03/23  Yes Etta Grandchild, MD  citalopram (CELEXA) 10 MG tablet TAKE 1 TABLET DAILY Patient taking differently: Take 10 mg by mouth daily. 02/11/23  Yes Etta Grandchild, MD  clotrimazole-betamethasone (LOTRISONE) cream Apply 1 Application topically daily.  01/14/23  Yes Corwin Levins, MD  famotidine (PEPCID) 20 MG tablet TAKE 1 TABLET ONCE TO TWICE A DAY Patient taking differently: Take 20 mg by mouth 2 (two) times daily. TAKE 1 TABLET ONCE TO TWICE A DAY 01/13/23  Yes Armbruster, Willaim Rayas, MD  FARXIGA 10 MG TABS tablet TAKE 1 TABLET DAILY BEFORE BREAKFAST 01/24/23  Yes Etta Grandchild, MD  ferrous sulfate 325 (65 FE) MG EC tablet Take 325 mg by mouth daily with breakfast. With Vitamin C   Yes [provider]  gabapentin (NEURONTIN) 300 MG capsule TAKE 1 CAPSULE THREE TIMES A DAY 07/16/22  Yes Etta Grandchild, MD  levocetirizine (XYZAL) 5 MG tablet TAKE ONE-HALF (1/2) TABLET (2.5 MG) EVERY OTHER DAY Patient taking differently: Take 2.5 mg by mouth every other day. 10/30/22  Yes Etta Grandchild, MD  trimethoprim (TRIMPEX) 100 MG tablet Take  100 mg by mouth daily. 03/21/23  Yes [provider]  Vitamin D, Ergocalciferol, (DRISDOL) 1.25 MG (50000 UNIT) CAPS capsule TAKE 1 CAPSULE EVERY 7 DAYS Patient taking differently: Take 50,000 Units by mouth every 7 (seven) days. 04/23/23  Yes Etta Grandchild, MD  Blood Glucose Monitoring Suppl (ONE TOUCH ULTRA 2) w/Device KIT 1 Act by Does not apply route daily. 04/10/23   Etta Grandchild, MD  glucose blood test strip 1 each by Other route daily. Use as instructed 04/22/23   Etta Grandchild, MD  Lancets [facility] DELICA PLUS [place]) MISC 1 Act by Does not apply route daily. 04/04/23   Etta Grandchild, MD    Physical Exam: Vitals:   05/02/23 1000 05/02/23 1005 05/02/23 1008 05/02/23 1045  BP: (!) 155/68   (!) 131/51  Pulse: (!) 58   78  Resp: 20   (!) 21  Temp:   99.9 F (37.7 C)   TempSrc:   Oral   SpO2: (!) 87%   100%  Weight:  68 kg    Height:  5\' 7"  (1.702 m)      Physical Exam Constitutional:      General: She is not in acute distress.    Appearance: Normal appearance.  HENT:     Head: Normocephalic and atraumatic.     Mouth/Throat:     Mouth: Mucous membranes are moist.      Pharynx: Oropharynx is clear.  Eyes:     Extraocular Movements: Extraocular movements intact.     Pupils: Pupils are equal, round, and reactive to light.  Cardiovascular:     Rate and Rhythm: Normal rate and regular rhythm.     Pulses: Normal pulses.     Heart sounds: Normal heart sounds.  Pulmonary:     Effort: Pulmonary effort is normal. No respiratory distress.     Breath sounds: Wheezing present.  Abdominal:     General: Bowel sounds are normal. There is no distension.     Palpations: Abdomen is soft.     Tenderness: There is no abdominal tenderness.  Musculoskeletal:        General: No swelling or deformity.  Skin:    General: Skin is warm and dry.  Neurological:     General: No focal deficit present.     Mental Status: Mental status is at baseline.    Labs on Admission: I have personally reviewed following labs and imaging studies  CBC: Recent Labs  Lab 05/02/23 1020  WBC 8.5  NEUTROABS 6.5  HGB 14.6  HCT 45.0  MCV 98.0  PLT 133*    Basic Metabolic Panel: Recent Labs  Lab 05/02/23 1020  NA 135  K 4.2  CL 102  CO2 18*  GLUCOSE 132*  BUN 21  CREATININE 1.63*  CALCIUM 9.4  MG 2.0    GFR: Estimated Creatinine Clearance: 22.8 mL/min (A) (by C-G formula based on SCr of 1.63 mg/dL (H)).  Liver Function Tests: Recent Labs  Lab 05/02/23 1020  AST 24  ALT 15  ALKPHOS 79  BILITOT 0.6  PROT 6.7  ALBUMIN 3.8    Urine analysis:    Component Value Date/Time   COLORURINE YELLOW 01/14/2023 0928   APPEARANCEUR CLEAR 01/14/2023 0928   APPEARANCEUR Cloudy (A) 12/17/2013 0906   LABSPEC 1.010 01/14/2023 0928   PHURINE 6.0 01/14/2023 0928   GLUCOSEU >=1000 (A) 01/14/2023 0928   HGBUR NEGATIVE 01/14/2023 0928   BILIRUBINUR NEGATIVE 01/14/2023 0928   BILIRUBINUR neg 08/19/2022 1603  BILIRUBINUR Negative 12/17/2013 0906   KETONESUR NEGATIVE 01/14/2023 0928   PROTEINUR 30 (A) 12/19/2022 2203   UROBILINOGEN 0.2 01/14/2023 0928   NITRITE NEGATIVE  01/14/2023 0928   LEUKOCYTESUR NEGATIVE 01/14/2023 0928    Radiological Exams on Admission: DG Chest Portable 1 View Result Date: 05/02/2023 CLINICAL DATA:  Shortness of breath EXAM: PORTABLE CHEST 1 VIEW COMPARISON:  Chest radiograph dated 12/19/2022 FINDINGS: Lines/tubes: Left chest wall pacemaker leads project over the right atrium and ventricle. Lungs: Well inflated lungs. Left mid lung linear opacities. Pleura: No pneumothorax or pleural effusion. Heart/mediastinum: Similar  cardiomediastinal silhouette. Bones: No acute osseous abnormality. IMPRESSION: Left mid lung linear opacities, likely atelectasis/scarring. No focal consolidations. Electronically Signed   By: Agustin Cree M.D.   On: 05/02/2023 10:48    EKG: Independently reviewed.  Atrial flutter at 74 bpm.  Nonspecific T wave changes.  Low voltage multiple leads.  Assessment/Plan Active Problems:   Essential hypertension   DM (diabetes mellitus), type 2 with renal complications (HCC)   History of CVA (cerebrovascular accident)   Paroxysmal atrial fibrillation (HCC)   Diabetic polyneuropathy associated with type 2 diabetes mellitus (HCC)   Spinal stenosis, lumbar region with neurogenic claudication   Acquired hypothyroidism   Iron deficiency anemia due to chronic blood loss   OAB (overactive bladder)   Compression fracture of T12 vertebra (HCC)   Anemia due to acquired thiamine deficiency   Asthma, chronic, unspecified asthma severity, with acute exacerbation   Pacemaker   CKD (chronic kidney disease) stage 4, GFR 15-29 ml/min (HCC)   Acute respiratory failure with hypoxia (HCC)   Acute respiratory failure with hypoxia Asthma exacerbation Influenza A > Patient presenting with a week of shortness of breath that has been worsening as above.  Hypoxic Fire department and transported to the ED via EMS. > In the ED chest x-ray with evidence of atelectasis for scarring but no focal consolidation.  Remains hypoxic requiring 2 to 3 L  to maintain saturations.  No leukocytosis.  Positive for influenza A with diffuse wheezing. > Received antibiotics initially and then Tamiflu after positive for flu A.  Also received Solu-Medrol, magnesium, DuoNeb - Monitoring telemetry overnight with continuous pulse ox - Continue with daily steroids - Scheduled Atrovent - As needed albuterol - Continue with Tamiflu to 30 mg daily - Continue supple oxygen, wean as tolerated  Recent UTI - Continue home trimethoprim  Hypertension - Continue home amlodipine  CKD 4  > Creatinine stable at 1.63. - Trend renal function and electrolytes  Anemia > Hemoglobin currently stable  Diabetes - SSI  Neuropathy - Continue home gabapentin  Atrial fibrillation - Continue home Eliquis  Hx of symptomatic bradycardia - Status post pacemaker  History of CVA - Continue home Atorvastatin and Eliquis  DVT prophylaxis: Eliquis Code Status:   DNR/DNI Family Communication:  None on admission  Disposition Plan:   Patient is from:  Home  Anticipated DC to:  Home  Anticipated DC date:  1 to 3 days  Anticipated DC barriers: None  Consults called:  None Admission status:  Observation, telemetry  Severity of Illness: The appropriate patient status for this patient is OBSERVATION. Observation status is judged to be reasonable and necessary in order to provide the required intensity of service to ensure the patient's safety. The patient's presenting symptoms, physical exam findings, and initial radiographic and laboratory data in the context of their medical condition is felt to place them at decreased risk for further clinical deterioration. Furthermore, it is  anticipated that the patient will be medically stable for discharge from the hospital within 2 midnights of admission.    Synetta Fail MD [facility]  How to contact the [facility] Attending or Consulting provider 7A - 7P or covering provider during after hours 7P -7A, for this  patient?   Check the care team in [facility] and look for a) attending/consulting [facility] provider listed and b) the [facility] team listed Log into [email or web address] and use [facility] universal password to access. If you do not have the password, please contact the hospital operator. Locate the [facility] provider you are looking for under [facility] and page to a number that you can be directly reached. If you still have difficulty reaching the provider, please page the [facility] (Director on Call) for the Hospitalists listed on amion for assistance.  05/02/2023, 1:33 PM

## 2023-05-02 NOTE — ED Provider Notes (Signed)
[place] EMERGENCY DEPARTMENT AT [facility] Provider Note   CSN: [id number] Arrival date & time: 05/02/23  7829     History  Chief Complaint  Patient presents with   Shortness of Breath    Penny Miller is a 89 y.o. female history of chronic cystitis, hypertension, diabetes, A-fib on Eliquis, vertebral artery aneurysm, CKD stage IV presented for 4 days of reductive cough with yellow sputum along with shortness of breath.  Patient denies any fevers or sick contacts but states she recently had a UTI which took all the antibiotics for.  Patient denies any chest pain but states that she feels she cannot breathe.  Patient went to the fire department was found to be at 70% on room air and placed on 2 L nasal cannula which is new she does not normally require oxygen.  Home Medications Prior to Admission medications   Medication Sig Start Date End Date Taking? Authorizing Provider  acetaminophen (TYLENOL) 500 MG tablet Take 500 mg by mouth every 6 (six) hours as needed (pain).   Yes [provider]  amLODipine (NORVASC) 2.5 MG tablet Take 2.5 mg by mouth at bedtime. 09/06/21  Yes [provider]  apixaban (ELIQUIS) 2.5 MG TABS tablet Take 1 tablet (2.5 mg total) by mouth 2 (two) times daily. 04/24/23  Yes Etta Grandchild, MD  atorvastatin (LIPITOR) 40 MG tablet TAKE 1 TABLET AT BEDTIME 04/03/23  Yes Etta Grandchild, MD  citalopram (CELEXA) 10 MG tablet TAKE 1 TABLET DAILY Patient taking differently: Take 10 mg by mouth daily. 02/11/23  Yes Etta Grandchild, MD  clotrimazole-betamethasone (LOTRISONE) cream Apply 1 Application topically daily. 01/14/23  Yes Corwin Levins, MD  famotidine (PEPCID) 20 MG tablet TAKE 1 TABLET ONCE TO TWICE A DAY Patient taking differently: Take 20 mg by mouth 2 (two) times daily. TAKE 1 TABLET ONCE TO TWICE A DAY 01/13/23  Yes Armbruster, Willaim Rayas, MD  FARXIGA 10 MG TABS tablet TAKE 1 TABLET DAILY BEFORE BREAKFAST 01/24/23  Yes Etta Grandchild, MD  ferrous sulfate 325 (65 FE) MG EC tablet Take 325 mg by mouth daily with breakfast. With Vitamin C   Yes [provider]  gabapentin (NEURONTIN) 300 MG capsule TAKE 1 CAPSULE THREE TIMES A DAY 07/16/22  Yes Etta Grandchild, MD  levocetirizine (XYZAL) 5 MG tablet TAKE ONE-HALF (1/2) TABLET (2.5 MG) EVERY OTHER DAY Patient taking differently: Take 2.5 mg by mouth every other day. 10/30/22  Yes Etta Grandchild, MD  trimethoprim (TRIMPEX) 100 MG tablet Take 100 mg by mouth daily. 03/21/23  Yes [provider]  Vitamin D, Ergocalciferol, (DRISDOL) 1.25 MG (50000 UNIT) CAPS capsule TAKE 1 CAPSULE EVERY 7 DAYS Patient taking differently: Take 50,000 Units by mouth every 7 (seven) days. 04/23/23  Yes Etta Grandchild, MD  Blood Glucose Monitoring Suppl (ONE TOUCH ULTRA 2) w/Device KIT 1 Act by Does not apply route daily. 04/10/23   Etta Grandchild, MD  glucose blood test strip 1 each by Other route daily. Use as instructed 04/22/23   Etta Grandchild, MD  Lancets [facility] DELICA PLUS [place]) MISC 1 Act by Does not apply route daily. 04/04/23   Etta Grandchild, MD      Allergies    Adhesive [tape], Ceclor [cefaclor], Codeine, Jardiance [empagliflozin], Lopid [gemfibrozil], Metformin and related, Metoprolol, Morphine and codeine, Niacin and related, Relafen [nabumetone], Septra [sulfamethoxazole-trimethoprim], and Tetracyclines & related    Review of Systems  Review of Systems  Physical Exam Updated Vital Signs BP (!) 131/51   Pulse 78   Temp 99.9 F (37.7 C) (Oral)   Resp (!) 21   Ht 5\' 7"  (1.702 m)   Wt 68 kg   SpO2 100%   BMI 23.49 kg/m  Physical Exam Constitutional:      General: She is not in acute distress. Cardiovascular:     Rate and Rhythm: Normal rate and regular rhythm.     Pulses: Normal pulses.     Heart sounds: Normal heart sounds.  Pulmonary:     Effort: Tachypnea present. No respiratory distress.     Breath sounds: Wheezing and rhonchi present.      Comments: Able to speak in full sentences Rhonchi and wheezing bilaterally On 2 L nasal cannula which is new Musculoskeletal:     Right lower leg: No edema.     Left lower leg: No edema.     Comments: No calf tenderness  Skin:    General: Skin is warm and dry.  Neurological:     Mental Status: She is alert.  Psychiatric:        Mood and Affect: Mood normal.     ED Results / Procedures / Treatments   Labs (all labs ordered are listed, but only abnormal results are displayed) Labs Reviewed  RESP PANEL BY RT-PCR (RSV, FLU A&B, COVID)  RVPGX2 - Abnormal; Notable for the following components:      Result Value   Influenza A by PCR POSITIVE (*)    All other components within normal limits  COMPREHENSIVE METABOLIC PANEL - Abnormal; Notable for the following components:   CO2 18 (*)    Glucose, Bld 132 (*)    Creatinine, Ser 1.63 (*)    GFR, Estimated 30 (*)    All other components within normal limits  CBC WITH DIFFERENTIAL/PLATELET - Abnormal; Notable for the following components:   Platelets 133 (*)    All other components within normal limits  I-STAT CG4 LACTIC ACID, ED - Abnormal; Notable for the following components:   Lactic Acid, Venous 2.3 (*)    All other components within normal limits  TROPONIN I (HIGH SENSITIVITY) - Abnormal; Notable for the following components:   Troponin I (High Sensitivity) 48 (*)    All other components within normal limits  CULTURE, BLOOD (ROUTINE X 2)  CULTURE, BLOOD (ROUTINE X 2)  MAGNESIUM  I-STAT CG4 LACTIC ACID, ED  TROPONIN I (HIGH SENSITIVITY)    EKG EKG Interpretation Date/Time:  Friday May 02 2023 10:02:47 EST Ventricular Rate:  74 PR Interval:    QRS Duration:  91 QT Interval:  394 QTC Calculation: 438 R Axis:   4  Text Interpretation: Atrial flutter Anteroseptal infarct, age indeterminate Lateral leads are also involved Confirmed by Alvester Chou [phone]) on 05/02/2023 10:30:45 AM  Radiology DG Chest Portable 1  View Result Date: 05/02/2023 CLINICAL DATA:  Shortness of breath EXAM: PORTABLE CHEST 1 VIEW COMPARISON:  Chest radiograph dated 12/19/2022 FINDINGS: Lines/tubes: Left chest wall pacemaker leads project over the right atrium and ventricle. Lungs: Well inflated lungs. Left mid lung linear opacities. Pleura: No pneumothorax or pleural effusion. Heart/mediastinum: Similar  cardiomediastinal silhouette. Bones: No acute osseous abnormality. IMPRESSION: Left mid lung linear opacities, likely atelectasis/scarring. No focal consolidations. Electronically Signed   By: Agustin Cree M.D.   On: 05/02/2023 10:48    Procedures .Critical Care  Performed by: Netta Corrigan, PA-C Authorized by: Netta Corrigan, PA-C  Critical care provider statement:    Critical care time (minutes):  40   Critical care time was exclusive of:  Separately billable procedures and treating other patients   Critical care was necessary to treat or prevent imminent or life-threatening deterioration of the following conditions:  Respiratory failure   Critical care was time spent personally by me on the following activities:  Blood draw for specimens, development of treatment plan with patient or surrogate, discussions with consultants, evaluation of patient's response to treatment, examination of patient, obtaining history from patient or surrogate, review of old charts, re-evaluation of patient's condition, pulse oximetry, ordering and review of radiographic studies, ordering and review of laboratory studies and ordering and performing treatments and interventions   I assumed direction of critical care for this patient from another provider in my specialty: no     Care discussed with: admitting provider       Medications Ordered in ED Medications  oseltamivir (TAMIFLU) capsule 30 mg (has no administration in time range)  oseltamivir (TAMIFLU) capsule 30 mg (has no administration in time range)  amLODipine (NORVASC) tablet 2.5 mg  (has no administration in time range)  atorvastatin (LIPITOR) tablet 40 mg (has no administration in time range)  citalopram (CELEXA) tablet 10 mg (has no administration in time range)  famotidine (PEPCID) tablet 20 mg (has no administration in time range)  apixaban (ELIQUIS) tablet 2.5 mg (has no administration in time range)  gabapentin (NEURONTIN) capsule 300 mg (has no administration in time range)  sodium chloride flush (NS) 0.9 % injection 3 mL (has no administration in time range)  acetaminophen (TYLENOL) tablet 650 mg (has no administration in time range)    Or  acetaminophen (TYLENOL) suppository 650 mg (has no administration in time range)  polyethylene glycol (MIRALAX / GLYCOLAX) packet 17 g (has no administration in time range)  ipratropium-albuterol (DUONEB) 0.5-2.5 (3) MG/3ML nebulizer solution 3 mL (3 mLs Nebulization Given 05/02/23 1035)  methylPREDNISolone sodium succinate (SOLU-MEDROL) 125 mg/2 mL injection 125 mg (125 mg Intravenous Given 05/02/23 1033)  cefTRIAXone (ROCEPHIN) 1 g in sodium chloride 0.9 % 100 mL IVPB (0 g Intravenous Stopped 05/02/23 1052)  azithromycin (ZITHROMAX) 500 mg in sodium chloride 0.9 % 250 mL IVPB (0 mg Intravenous Stopped 05/02/23 1318)  magnesium sulfate IVPB 2 g 50 mL (0 g Intravenous Stopped 05/02/23 1052)  acetaminophen (TYLENOL) tablet 650 mg (650 mg Oral Given 05/02/23 1052)    ED Course/ Medical Decision Making/ A&P Clinical Course as of 05/02/23 1333  Fri May 02, 2023  1056 This is an 89 year old female presenting from home with shortness of breath, onset yesterday.  Reported also malaise and weakness.  On exam her temperature is 99.9.  Heart rate normal.  Blood pressure relatively unremarkable.  She is requiring 3 L nasal cannula which is new oxygen requirement due to hypoxia to 87% on room air.  She has audible wheezing bilaterally on exam when I listen to her.  She is able to speak in full sentences.  Chest x-ray showing potential infiltrate  versus atelectasis.  Labs are notable for white blood cell count normal, lactate elevated 2.3.  At this point I clinically suspect this may be a viral or bacterial respiratory infection triggering COPD exacerbation.  Patient is receiving COPD medications, IV steroids, magnesium, DuoNebs.  She is also getting IV Rocephin and azithromycin for potential community pneumonia.  Her flu and COVID test are pending.  She is stable at this point for likely medical admission.  Informed the patient and her granddaughter at bedside about this and both are in agreement. [MT]  1057 Lower suspicion for acute PE at this time. [MT]  1152 Influenza A By PCR(!): POSITIVE [MT]    Clinical Course User Index [MT] Trifan, Kermit Balo, MD                                 Medical Decision Making Amount and/or Complexity of Data Reviewed Labs: ordered. Decision-making details documented in ED Course. Radiology: ordered.  Risk OTC drugs. Prescription drug management. Decision regarding hospitalization.   Penny Miller 89 y.o. presented today for shortness of breath.  Working DDx that I considered at this time includes, but not limited to, asthma/COPD exacerbation, URI, viral illness, anemia, ACS, PE, pneumonia, pleural effusion, lung cancer, CHF, respiratory distress, medication side effect, intoxication.  R/o DDx: Asthma exacerbation, URI, viral illness, anemia, ACS, PE, pneumonia, pleural effusion, lung cancer, CHF, respiratory distress, medication side effect, intoxication: These are considered less likely due to history of present illness, physical exam, labs/imaging findings  Review of prior external notes: 03/12/2023 office visit  Unique Tests and My Independent Interpretation:  CBC: Unremarkable CMP: Unremarkable EKG: A flutter 74 bpm, no ST elevations or depressions noted, no signs of right heart strain; similar to previous EKGs Troponin: 48 CXR: Left lung linear opacities Blood cultures: Pending Lactic  acid: 2.3 Magnesium: Unremarkable Respiratory Panel: Influenza A positive  Social Determinants of Health: none  Discussion with Independent Historian:  EMS, daughter  Discussion of Management of Tests:  Alinda Money, MD hospitalist  Risk: High: hospitalization or escalation of hospital-level care  Risk Stratification Score: None  Staffed with Trifan, MD  Plan: On exam patient was in no acute distress but was on oxygen which is new requirement.  Patient on exam had bilateral rhonchi and wheezing and endorses yellow sputum with history of COPD.  Patient's oral temperature was 99.35F by the paramedic and so at this time due to patient's having COPD exacerbation and will obtain labs and start IV antibiotics.  We do not have vitals yet but vitals are indicative of sepsis we will start sepsis workup.  Do feel patient will need admission.  Patient's vitals are not indicative of sepsis at this time but will give Tylenol for temperature.  Lactic acid was elevated 2.3.  Patient's last echo does show moderate pulmonary hypertension so this time we will hold off on extra fluids this patient already been receiving fluids with the IV medications.  Patient's labs came back showing influenza A positive which is most likely exacerbating patient's COPD.  Patient troponin is 48 however this is most likely elevated in the setting of demand ischemia.  At this time patient stable to be admitted and will call hospitalist.  Will start on renal dosing of Tamiflu as she is within the first 48 hours of symptoms.  I spoke to the hospitalist and patient was accepted for admission.  Patient stable for admission.  This chart was dictated using voice recognition software.  Despite best efforts to proofread,  errors can occur which can change the documentation meaning.         Final Clinical Impression(s) / ED Diagnoses Final diagnoses:  COPD exacerbation (HCC)  Influenza A  Acute on chronic respiratory failure  with hypoxia (HCC)    Rx / DC Orders ED Discharge Orders     None  Netta Corrigan, PA-C 05/02/23 1333    Terald Sleeper, MD 05/02/23 [phone]

## 2023-05-02 NOTE — ED Triage Notes (Signed)
Pt BIB [facility] from home d/t SOB the past week. EMS reports RA sats were 70% upon their arrival, then 96% during a Duo Neb & 89% on 4L via n/c. A/Ox4, pt denies pain upon arrival "other than when I cough" in chest & throat. NSR, pacemaker, Cap of 24, resp were 24-30, 152/68. 20g PIV in Lt hand, did receive 5 Albuterol, 0.5 Atrovent & 125 Solumedrol en route to ED.

## 2023-05-02 NOTE — ED Notes (Signed)
 CCMD called.

## 2023-05-03 DIAGNOSIS — U071 COVID-19: Secondary | ICD-10-CM | POA: Diagnosis not present

## 2023-05-03 DIAGNOSIS — J9601 Acute respiratory failure with hypoxia: Secondary | ICD-10-CM | POA: Diagnosis not present

## 2023-05-03 LAB — COMPREHENSIVE METABOLIC PANEL
ALT: 15 U/L (ref 0–44)
AST: 22 U/L (ref 15–41)
Albumin: 3 g/dL — ABNORMAL LOW (ref 3.5–5.0)
Alkaline Phosphatase: 69 U/L (ref 38–126)
Anion gap: 11 (ref 5–15)
BUN: 30 mg/dL — ABNORMAL HIGH (ref 8–23)
CO2: 22 mmol/L (ref 22–32)
Calcium: 8.9 mg/dL (ref 8.9–10.3)
Chloride: 101 mmol/L (ref 98–111)
Creatinine, Ser: 1.62 mg/dL — ABNORMAL HIGH (ref 0.44–1.00)
GFR, Estimated: 30 mL/min — ABNORMAL LOW (ref >60)
Glucose, Bld: 156 mg/dL — ABNORMAL HIGH (ref 70–99)
Potassium: 4.5 mmol/L (ref 3.5–5.1)
Sodium: 134 mmol/L — ABNORMAL LOW (ref 135–145)
Total Bilirubin: 0.4 mg/dL (ref 0.0–1.2)
Total Protein: 5.6 g/dL — ABNORMAL LOW (ref 6.5–8.1)

## 2023-05-03 LAB — CBC
HCT: 36.7 % (ref 36.0–46.0)
Hemoglobin: 12.3 g/dL (ref 12.0–15.0)
MCH: 32 pg (ref 26.0–34.0)
MCHC: 33.5 g/dL (ref 30.0–36.0)
MCV: 95.6 fL (ref 80.0–100.0)
Platelets: 124 10*3/uL — ABNORMAL LOW (ref 150–400)
RBC: 3.84 MIL/uL — ABNORMAL LOW (ref 3.87–5.11)
RDW: 13.9 % (ref 11.5–15.5)
WBC: 12.6 10*3/uL — ABNORMAL HIGH (ref 4.0–10.5)
nRBC: 0 % (ref 0.0–0.2)

## 2023-05-03 LAB — GLUCOSE, CAPILLARY
Glucose-Capillary: 152 mg/dL — ABNORMAL HIGH (ref 70–99)
Glucose-Capillary: 74 mg/dL (ref 70–99)
Glucose-Capillary: 169 mg/dL — ABNORMAL HIGH (ref 70–99)
Glucose-Capillary: 132 mg/dL — ABNORMAL HIGH (ref 70–99)

## 2023-05-03 NOTE — Discharge Instructions (Signed)

## 2023-05-03 NOTE — Progress Notes (Addendum)
PROGRESS NOTE    Penny Miller  UJW:[id number] DOB: [birth year] DOA: 05/02/2023 PCP: Etta Grandchild, MD   Brief Narrative:  This 89 yrs old female with medical history significant of hypertension, hypothyroidism, CKD 4, asthma, anemia, OAB, diabetes, neuropathy, atrial fibrillation, CVA, spinal stenosis, T12 compression fracture presenting with shortness of breath.  Patient reports having worsening shortness of breath for last 1 week and she went to fire department she was found to be hypoxic with SpO2 of 70% on room air.  Patient reports ongoing cough and recent antibiotic course for UTI but denies any fever or sick contacts.  Chest x-ray showed left midlung opacity consistent with atelectasis versus scarring or focal consolidation.  She is found to have influenza A positive , Patient admitted for further evaluation and started on Tamiflu, Solu-Medrol, azithromycin, ceftriaxone and nebulization.  Assessment & Plan:   Active Problems:   Essential hypertension   DM (diabetes mellitus), type 2 with renal complications (HCC)   History of CVA (cerebrovascular accident)   Paroxysmal atrial fibrillation (HCC)   Diabetic polyneuropathy associated with type 2 diabetes mellitus (HCC)   Spinal stenosis, lumbar region with neurogenic claudication   Acquired hypothyroidism   Iron deficiency anemia due to chronic blood loss   OAB (overactive bladder)   Compression fracture of T12 vertebra (HCC)   Anemia due to acquired thiamine deficiency   Asthma, chronic, unspecified asthma severity, with acute exacerbation   Pacemaker   CKD (chronic kidney disease) stage 4, GFR 15-29 ml/min (HCC)   Acute respiratory failure with hypoxia (HCC)   Acute hypoxic respiratory failure in the setting of influenza A infection. Asthma exacerbation: Patient presenting with one week of shortness of breath that has been worsening. She was found hypoxic at [facility] department and transported to the ED via EMS. CXR with  evidence of atelectasis or scarring but no focal consolidation.   She remains hypoxic requiring 2 to 3 L to maintain saturations.   No leukocytosis.  Positive for influenza A with diffuse wheezing. She received antibiotics initially and then Tamiflu after positive for flu A.   Continue supplemental oxygen and wean as tolerated. Continue with daily steroids Continue Scheduled Atrovent and As needed albuterol Continue with Tamiflu 75 mg twice daily.  Recent UTI: Continue home trimethoprim.   Hypertension: Continue home amlodipine.   CKD 4 : Creatinine stable at 1.63. Trend renal function and electrolytes.   Anemia: Hemoglobin currently stable. No Signs of any obvious bleeding.   Diabetes Carb modified diet, Continue regular insulin sliding scale.   Neuropathy: Continue home gabapentin.   Atrial fibrillation: Rate well-controlled. Continue home Eliquis   Hx of symptomatic bradycardia Status post pacemaker.   History of CVA Continue home Atorvastatin and Eliquis.   DVT prophylaxis: Eliquis Code Status:DNR Family Communication: No family at bed side. Disposition Plan:    Status is: Observation The patient remains OBS appropriate and will d/c before 2 midnights.  Admitted for acute hypoxic respiratory failure secondary to influenza and COPD exacerbation.   Consultants:  None  Procedures: None  Antimicrobials:  Anti-infectives (From admission, onward)    Start     Dose/Rate Route Frequency Ordered Stop   05/03/23 1000  oseltamivir (TAMIFLU) capsule 30 mg        30 mg Oral Daily 05/02/23 1333 05/08/23 0959   05/03/23 1000  trimethoprim (TRIMPEX) tablet 100 mg        100 mg Oral Daily 05/02/23 1454     05/02/23 1315  oseltamivir (TAMIFLU)  capsule 30 mg        30 mg Oral  Once 05/02/23 1301 05/02/23 2155   05/02/23 1015  cefTRIAXone (ROCEPHIN) 1 g in sodium chloride 0.9 % 100 mL IVPB        1 g 200 mL/hr over 30 Minutes Intravenous  Once 05/02/23 1002  05/02/23 1052   05/02/23 1015  azithromycin (ZITHROMAX) 500 mg in sodium chloride 0.9 % 250 mL IVPB        500 mg 250 mL/hr over 60 Minutes Intravenous  Once 05/02/23 1002 05/02/23 1318      Subjective: Patient was seen and examined at bedside.  Overnight events noted. Patient reports feeling better,  She remains on supplemental oxygen,  reports feeling improved.  Objective: Vitals:   05/03/23 0557 05/03/23 0623 05/03/23 0838 05/03/23 0932  BP: (!) 197/88 (!) 156/70  (!) 180/88  Pulse: 63   65  Resp: 18   18  Temp: 98.2 F (36.8 C)   (!) 97.4 F (36.3 C)  TempSrc:    Oral  SpO2: 96%  95% 95%  Weight:      Height:        Intake/Output Summary (Last 24 hours) at 05/03/2023 1202 Last data filed at 05/02/2023 2154 Gross per 24 hour  Intake 240 ml  Output --  Net 240 ml   Filed Weights   05/02/23 1005  Weight: 68 kg    Examination:  General exam: Appears calm and comfortable, not in any acute distress. Respiratory system: Clear to auscultation. Respiratory effort normal.  RR 15 Cardiovascular system: S1 & S2 heard, RRR. No JVD, murmurs, rubs, gallops or clicks.  Gastrointestinal system: Abdomen is non distended, soft and non tender.Normal bowel sounds heard. Central nervous system: Alert and oriented X 3. No focal neurological deficits. Extremities: No edema, no cyanosis, no clubbing. Skin: No rashes, lesions or ulcers Psychiatry: Judgement and insight appear normal. Mood & affect appropriate.     Data Reviewed: I have personally reviewed following labs and imaging studies  CBC: Recent Labs  Lab 05/02/23 1020 05/03/23 0502  WBC 8.5 12.6*  NEUTROABS 6.5  --   HGB 14.6 12.3  HCT 45.0 36.7  MCV 98.0 95.6  PLT 133* 124*   Basic Metabolic Panel: Recent Labs  Lab 05/02/23 1020 05/03/23 0502  NA 135 134*  K 4.2 4.5  CL 102 101  CO2 18* 22  GLUCOSE 132* 156*  BUN 21 30*  CREATININE 1.63* 1.62*  CALCIUM 9.4 8.9  MG 2.0  --    GFR: Estimated Creatinine  Clearance: 22.9 mL/min (A) (by C-G formula based on SCr of 1.62 mg/dL (H)). Liver Function Tests: Recent Labs  Lab 05/02/23 1020 05/03/23 0502  AST 24 22  ALT 15 15  ALKPHOS 79 69  BILITOT 0.6 0.4  PROT 6.7 5.6*  ALBUMIN 3.8 3.0*   No results for input(s): "LIPASE", "AMYLASE" in the last 168 hours. No results for input(s): "AMMONIA" in the last 168 hours. Coagulation Profile: No results for input(s): "INR", "PROTIME" in the last 168 hours. Cardiac Enzymes: No results for input(s): "CKTOTAL", "CKMB", "CKMBINDEX", "TROPONINI" in the last 168 hours. BNP (last 3 results) No results for input(s): "PROBNP" in the last 8760 hours. HbA1C: No results for input(s): "HGBA1C" in the last 72 hours. CBG: Recent Labs  Lab 05/02/23 2058 05/03/23 0732 05/03/23 1121  GLUCAP 253* 152* 74   Lipid Profile: No results for input(s): "CHOL", "HDL", "LDLCALC", "TRIG", "CHOLHDL", "LDLDIRECT" in the last 72 hours.  Thyroid Function Tests: No results for input(s): "TSH", "T4TOTAL", "FREET4", "T3FREE", "THYROIDAB" in the last 72 hours. Anemia Panel: No results for input(s): "VITAMINB12", "FOLATE", "FERRITIN", "TIBC", "IRON", "RETICCTPCT" in the last 72 hours. Sepsis Labs: Recent Labs  Lab 05/02/23 1028 05/02/23 1450 05/02/23 1638  LATICACIDVEN 2.3* 4.4* 4.6*    Recent Results (from the past 240 hours)  Culture, blood (routine x 2)     Status: None (Preliminary result)   Collection Time: 05/02/23 10:03 AM   Specimen: BLOOD  Result Value Ref Range Status   Specimen Description BLOOD BLOOD RIGHT FOREARM  Final   Special Requests   Final    BOTTLES DRAWN AEROBIC AND ANAEROBIC Blood Culture adequate volume   Culture   Final    NO GROWTH < 24 HOURS Performed at [facility], [address], [place], [place] [address]    Report Status PENDING  Incomplete  Culture, blood (routine x 2)     Status: None (Preliminary result)   Collection Time: 05/02/23 10:08 AM   Specimen: BLOOD  Result  Value Ref Range Status   Specimen Description BLOOD RIGHT ANTECUBITAL  Final   Special Requests   Final    BOTTLES DRAWN AEROBIC AND ANAEROBIC Blood Culture adequate volume   Culture   Final    NO GROWTH < 24 HOURS Performed at [facility], [address], [place], [place] [address]    Report Status PENDING  Incomplete  Resp panel by RT-PCR (RSV, Flu A&B, Covid) Anterior Nasal Swab     Status: Abnormal   Collection Time: 05/02/23 10:20 AM   Specimen: Anterior Nasal Swab  Result Value Ref Range Status   SARS Coronavirus 2 by RT PCR NEGATIVE NEGATIVE Final   Influenza A by PCR POSITIVE (A) NEGATIVE Final   Influenza B by PCR NEGATIVE NEGATIVE Final    Comment: (NOTE) The Xpert Xpress SARS-CoV-2/FLU/RSV plus assay is intended as an aid in the diagnosis of influenza from Nasopharyngeal swab specimens and should not be used as a sole basis for treatment. Nasal washings and aspirates are unacceptable for Xpert Xpress SARS-CoV-2/FLU/RSV testing.  Fact Sheet for Patients: BloggerCourse.com  Fact Sheet for Healthcare Providers: SeriousBroker.it  This test is not yet approved or cleared by the [place] FDA and has been authorized for detection and/or diagnosis of SARS-CoV-2 by FDA under an Emergency Use Authorization (EUA). This EUA will remain in effect (meaning this test can be used) for the duration of the COVID-19 declaration under Section 564(b)(1) of the Act, 21 U.S.C. section 360bbb-3(b)(1), unless the authorization is terminated or revoked.     Resp Syncytial Virus by PCR NEGATIVE NEGATIVE Final    Comment: (NOTE) Fact Sheet for Patients: BloggerCourse.com  Fact Sheet for Healthcare Providers: SeriousBroker.it  This test is not yet approved or cleared by the [place] FDA and has been authorized for detection and/or diagnosis of SARS-CoV-2 by FDA under an  Emergency Use Authorization (EUA). This EUA will remain in effect (meaning this test can be used) for the duration of the COVID-19 declaration under Section 564(b)(1) of the Act, 21 U.S.C. section 360bbb-3(b)(1), unless the authorization is terminated or revoked.  Performed at [facility], [address], [place], [place] [address]    Radiology Studies: DG Chest Portable 1 View Result Date: 05/02/2023 CLINICAL DATA:  Shortness of breath EXAM: PORTABLE CHEST 1 VIEW COMPARISON:  Chest radiograph dated 12/19/2022 FINDINGS: Lines/tubes: Left chest wall pacemaker leads project over the right atrium  and ventricle. Lungs: Well inflated lungs. Left mid lung linear opacities. Pleura: No pneumothorax or pleural effusion. Heart/mediastinum: Similar  cardiomediastinal silhouette. Bones: No acute osseous abnormality. IMPRESSION: Left mid lung linear opacities, likely atelectasis/scarring. No focal consolidations. Electronically Signed   By: Agustin Cree M.D.   On: 05/02/2023 10:48   Scheduled Meds:  amLODipine  2.5 mg Oral QHS   apixaban  2.5 mg Oral BID   atorvastatin  40 mg Oral QHS   citalopram  10 mg Oral Daily   famotidine  20 mg Oral QODAY   gabapentin  300 mg Oral TID   insulin aspart  0-9 Units Subcutaneous TID WC   ipratropium  0.5 mg Nebulization Q6H WA   oseltamivir  30 mg Oral Daily   predniSONE  50 mg Oral Q breakfast   sodium chloride flush  3 mL Intravenous Q12H   trimethoprim  100 mg Oral Daily   Continuous Infusions:   LOS: 0 days    Time spent: 50 mins    Willeen Niece, MD [facility]   If 7PM-7AM, please contact night-coverage

## 2023-05-03 NOTE — Care Management Obs Status (Signed)
MEDICARE OBSERVATION STATUS NOTIFICATION   Patient Details  Name: Penny Miller MRN: [id number] Date of Birth: November 10, 1931   Medicare Observation Status Notification Given:  Yes    Ronny Bacon, RN 05/03/2023, 4:10 PM

## 2023-05-03 NOTE — Plan of Care (Signed)
  Problem: Education: Goal: Ability to describe self-care measures that may prevent or decrease complications (Diabetes Survival Skills Education) will improve Outcome: Progressing   Problem: Tissue Perfusion: Goal: Adequacy of tissue perfusion will improve Outcome: Progressing   Problem: Activity: Goal: Risk for activity intolerance will decrease Outcome: Progressing   Problem: Skin Integrity: Goal: Risk for impaired skin integrity will decrease Outcome: Progressing

## 2023-05-04 DIAGNOSIS — J9601 Acute respiratory failure with hypoxia: Secondary | ICD-10-CM | POA: Diagnosis not present

## 2023-05-04 DIAGNOSIS — U071 COVID-19: Secondary | ICD-10-CM | POA: Diagnosis not present

## 2023-05-04 LAB — LACTIC ACID, PLASMA: Lactic Acid, Venous: 1.3 mmol/L (ref 0.5–1.9)

## 2023-05-04 LAB — BASIC METABOLIC PANEL
Anion gap: 10 (ref 5–15)
BUN: 28 mg/dL — ABNORMAL HIGH (ref 8–23)
CO2: 25 mmol/L (ref 22–32)
Calcium: 9.4 mg/dL (ref 8.9–10.3)
Chloride: 102 mmol/L (ref 98–111)
Creatinine, Ser: 1.35 mg/dL — ABNORMAL HIGH (ref 0.44–1.00)
GFR, Estimated: 38 mL/min — ABNORMAL LOW (ref >60)
Glucose, Bld: 121 mg/dL — ABNORMAL HIGH (ref 70–99)
Potassium: 4.4 mmol/L (ref 3.5–5.1)
Sodium: 137 mmol/L (ref 135–145)

## 2023-05-04 LAB — CBC
HCT: 39.5 % (ref 36.0–46.0)
Hemoglobin: 13.2 g/dL (ref 12.0–15.0)
MCH: 32.1 pg (ref 26.0–34.0)
MCHC: 33.4 g/dL (ref 30.0–36.0)
MCV: 96.1 fL (ref 80.0–100.0)
Platelets: 136 10*3/uL — ABNORMAL LOW (ref 150–400)
RBC: 4.11 MIL/uL (ref 3.87–5.11)
RDW: 13.8 % (ref 11.5–15.5)
WBC: 15.2 10*3/uL — ABNORMAL HIGH (ref 4.0–10.5)
nRBC: 0 % (ref 0.0–0.2)

## 2023-05-04 LAB — MAGNESIUM: Magnesium: 2.3 mg/dL (ref 1.7–2.4)

## 2023-05-04 LAB — GLUCOSE, CAPILLARY
Glucose-Capillary: 107 mg/dL — ABNORMAL HIGH (ref 70–99)
Glucose-Capillary: 120 mg/dL — ABNORMAL HIGH (ref 70–99)

## 2023-05-04 LAB — TROPONIN I (HIGH SENSITIVITY): Troponin I (High Sensitivity): 43 ng/L — ABNORMAL HIGH (ref <18)

## 2023-05-04 LAB — PHOSPHORUS: Phosphorus: 2.6 mg/dL (ref 2.5–4.6)

## 2023-05-04 MED ORDER — PREDNISONE 50 MG PO TABS: ORAL_TABLET | ORAL | 0 refills | Status: DC

## 2023-05-04 MED ORDER — OSELTAMIVIR PHOSPHATE 30 MG PO CAPS: 30.0000 mg | ORAL_CAPSULE | Freq: Every day | ORAL | 0 refills | Status: AC

## 2023-05-04 MED ORDER — OSELTAMIVIR PHOSPHATE 30 MG PO CAPS: 30.0000 mg | ORAL_CAPSULE | Freq: Every day | ORAL | 0 refills | Status: DC

## 2023-05-04 NOTE — Discharge Summary (Signed)
Physician Discharge Summary  Penny Miller VWU:[id number] DOB: November 29, 1931 DOA: 05/02/2023  PCP: Etta Grandchild, MD  Admit date: 05/02/2023  Discharge date: 05/04/2023  Admitted From:Home  Disposition:  Home  Recommendations for Outpatient Follow-up:  Follow up with PCP in 1-2 weeks. Please obtain BMP/CBC in one week. Advised to continue Tamiflu for 3 more days to complete a 5-day treatment. Advised to continue prednisone 50 mg for 3 more days to complete 5-day treatment.  Home Health: None Equipment/Devices:None  Discharge Condition: Stable CODE STATUS:DNR Diet recommendation: Heart Healthy  Brief Summary/ Hospital Course: This 89 yrs old female with medical history significant of hypertension, hypothyroidism, CKD 4, asthma, anemia, OAB, diabetes, neuropathy, atrial fibrillation, CVA, spinal stenosis, T12 compression fracture presenting with shortness of breath.  Patient reports having worsening shortness of breath for last 1 week and she went to fire department she was found to be hypoxic with SpO2 of 70% on room air.  Patient reports ongoing cough and recent antibiotic course for UTI but denies any fever or sick contacts.  Chest x-ray showed left midlung opacity consistent with atelectasis versus scarring or focal consolidation.  She is found to have influenza A positive. Patient admitted for further evaluation and started on Tamiflu, Solu-Medrol, azithromycin, ceftriaxone and nebulization.  Patient was continued on IV fluids.  Patient has made significant improvement felt much improved she is successfully weaned down to room air.  Patient feels much better , wants to be discharged.  Patient is being discharged home on Tamiflu and prednisone for 3 more days.   Discharge Diagnoses:  Active Problems:   Essential hypertension   DM (diabetes mellitus), type 2 with renal complications (HCC)   History of CVA (cerebrovascular accident)   Paroxysmal atrial fibrillation (HCC)   Diabetic  polyneuropathy associated with type 2 diabetes mellitus (HCC)   Spinal stenosis, lumbar region with neurogenic claudication   Acquired hypothyroidism   Iron deficiency anemia due to chronic blood loss   OAB (overactive bladder)   Compression fracture of T12 vertebra (HCC)   Anemia due to acquired thiamine deficiency   Asthma, chronic, unspecified asthma severity, with acute exacerbation   Pacemaker   CKD (chronic kidney disease) stage 4, GFR 15-29 ml/min (HCC)   Acute respiratory failure with hypoxia (HCC)  Acute hypoxic respiratory failure in the setting of influenza A infection. Asthma exacerbation: Patient presenting with one week of shortness of breath that has been worsening. She was found hypoxic at [facility] department and transported to the ED via EMS. CXR with evidence of atelectasis or scarring but no focal consolidation.   She remains hypoxic requiring 2 to 3 L to maintain saturations.   No leukocytosis.  Positive for influenza A with diffuse wheezing. She received antibiotics initially and then Tamiflu after positive for flu A.   Continue supplemental oxygen and wean as tolerated. Continue with daily steroids Continue Scheduled Atrovent and As needed albuterol Continue with Tamiflu 75 mg twice daily.   Recent UTI: Continue home trimethoprim.   Hypertension: Continue home amlodipine.   CKD 4 : Creatinine stable at 1.63. Serum creatinine at baseline.   Anemia: Hemoglobin currently stable. No Signs of any obvious bleeding.   Diabetes Carb modified diet, Continue regular insulin sliding scale.   Neuropathy: Continue home gabapentin.   Atrial fibrillation: Rate well-controlled. Continue home Eliquis   Hx of symptomatic bradycardia Status post pacemaker.   History of CVA Continue home Atorvastatin and Eliquis.  Discharge Instructions  Discharge Instructions     Call  MD for:  difficulty breathing, headache or visual disturbances   Complete by: As directed     Call MD for:  persistant dizziness or light-headedness   Complete by: As directed    Call MD for:  persistant nausea and vomiting   Complete by: As directed    Diet - low sodium heart healthy   Complete by: As directed    Diet Carb Modified   Complete by: As directed    Discharge instructions   Complete by: As directed    Advised to follow-up with primary care physician in 1 week. Advised to continue Tamiflu for 3 more days to complete a 5-day treatment. Advised to continue prednisone 50 mg for 3 more days to complete 5-day treatment.   Increase activity slowly   Complete by: As directed       Allergies as of 05/04/2023       Reactions   Adhesive [tape] Rash, Other (See Comments)   Tears the skin also   Ceclor [cefaclor] Other (See Comments)   Unknown reaction; not recalled   Codeine Nausea And Vomiting   Jardiance [empagliflozin] Diarrhea   Lopid [gemfibrozil] Other (See Comments)   Unknown reaction; not recalled   Metformin And Related Diarrhea   Metoprolol Nausea And Vomiting   Morphine And Codeine Other (See Comments)   Passed out   Niacin And Related Other (See Comments)   Unknown reaction   Relafen [nabumetone] Nausea And Vomiting   Septra [sulfamethoxazole-trimethoprim] Nausea And Vomiting   Tetracyclines & Related Nausea And Vomiting        Medication List     TAKE these medications    acetaminophen 500 MG tablet Commonly known as: TYLENOL Take 500 mg by mouth every 6 (six) hours as needed (pain).   amLODipine 2.5 MG tablet Commonly known as: NORVASC Take 2.5 mg by mouth at bedtime.   apixaban 2.5 MG Tabs tablet Commonly known as: Eliquis Take 1 tablet (2.5 mg total) by mouth 2 (two) times daily.   atorvastatin 40 MG tablet Commonly known as: LIPITOR TAKE 1 TABLET AT BEDTIME   citalopram 10 MG tablet Commonly known as: CELEXA TAKE 1 TABLET DAILY   clotrimazole-betamethasone cream Commonly known as: LOTRISONE Apply 1 Application topically  daily.   famotidine 20 MG tablet Commonly known as: PEPCID TAKE 1 TABLET ONCE TO TWICE A DAY What changed:  how much to take how to take this when to take this   Farxiga 10 MG Tabs tablet Generic drug: dapagliflozin propanediol TAKE 1 TABLET DAILY BEFORE BREAKFAST   ferrous sulfate 325 (65 FE) MG EC tablet Take 325 mg by mouth daily with breakfast. With Vitamin C   gabapentin 300 MG capsule Commonly known as: NEURONTIN TAKE 1 CAPSULE THREE TIMES A DAY   glucose blood test strip 1 each by Other route daily. Use as instructed   levocetirizine 5 MG tablet Commonly known as: XYZAL TAKE ONE-HALF (1/2) TABLET (2.5 MG) EVERY OTHER DAY What changed: See the new instructions.   ONE TOUCH ULTRA 2 w/Device Kit 1 Act by Does not apply route daily.   OneTouch Delica Plus Lancet33G Misc 1 Act by Does not apply route daily.   oseltamivir 30 MG capsule Commonly known as: TAMIFLU Take 1 capsule (30 mg total) by mouth daily for 3 doses. Start taking on: May 05, 2023   predniSONE 50 MG tablet Commonly known as: DELTASONE Advised to take prednisone for 3 more days to complete 5-day treatment. Start taking on: May 05, 2023   trimethoprim 100 MG tablet Commonly known as: TRIMPEX Take 100 mg by mouth daily.   Vitamin D (Ergocalciferol) 1.25 MG (50000 UNIT) Caps capsule Commonly known as: DRISDOL TAKE 1 CAPSULE EVERY 7 DAYS What changed: See the new instructions.        Follow-up Information     Etta Grandchild, MD Follow up in 1 week(s).   Specialty: Internal Medicine Contact information: [address] [place] [address] [phone]                Allergies  Allergen Reactions   Adhesive [Tape] Rash and Other (See Comments)    Tears the skin also   Ceclor [Cefaclor] Other (See Comments)    Unknown reaction; not recalled   Codeine Nausea And Vomiting   Jardiance [Empagliflozin] Diarrhea   Lopid [Gemfibrozil] Other (See Comments)    Unknown  reaction; not recalled   Metformin And Related Diarrhea   Metoprolol Nausea And Vomiting   Morphine And Codeine Other (See Comments)    Passed out   Niacin And Related Other (See Comments)    Unknown reaction   Relafen [Nabumetone] Nausea And Vomiting   Septra [Sulfamethoxazole-Trimethoprim] Nausea And Vomiting   Tetracyclines & Related Nausea And Vomiting    Consultations: None   Procedures/Studies: DG Chest Portable 1 View Result Date: 05/02/2023 CLINICAL DATA:  Shortness of breath EXAM: PORTABLE CHEST 1 VIEW COMPARISON:  Chest radiograph dated 12/19/2022 FINDINGS: Lines/tubes: Left chest wall pacemaker leads project over the right atrium and ventricle. Lungs: Well inflated lungs. Left mid lung linear opacities. Pleura: No pneumothorax or pleural effusion. Heart/mediastinum: Similar  cardiomediastinal silhouette. Bones: No acute osseous abnormality. IMPRESSION: Left mid lung linear opacities, likely atelectasis/scarring. No focal consolidations. Electronically Signed   By: Agustin Cree M.D.   On: 05/02/2023 10:48    Subjective: Patient was seen and examined at bedside.  Overnight events noted.   Patient feels much improved.  Wants to be discharged.  Discharge Exam: Vitals:   05/04/23 0817 05/04/23 0903  BP: (!) 148/75   Pulse: 63   Resp: 18   Temp:    SpO2: 94% 92%   Vitals:   05/03/23 2235 05/04/23 0443 05/04/23 0817 05/04/23 0903  BP: (!) 195/93 (!) 152/82 (!) 148/75   Pulse: 60 62 63   Resp: 18 18 18    Temp: 98.2 F (36.8 C) 98.2 F (36.8 C)    TempSrc:      SpO2: 97% 93% 94% 92%  Weight:      Height:        General: Pt is alert, awake, not in acute distress Cardiovascular: RRR, S1/S2 +, no rubs, no gallops Respiratory: CTA bilaterally, no wheezing, no rhonchi Abdominal: Soft, NT, ND, bowel sounds + Extremities: no edema, no cyanosis    The results of significant diagnostics from this hospitalization (including imaging, microbiology, ancillary and laboratory)  are listed below for reference.     Microbiology: Recent Results (from the past 240 hours)  Culture, blood (routine x 2)     Status: None (Preliminary result)   Collection Time: 05/02/23 10:03 AM   Specimen: BLOOD  Result Value Ref Range Status   Specimen Description BLOOD BLOOD RIGHT FOREARM  Final   Special Requests   Final    BOTTLES DRAWN AEROBIC AND ANAEROBIC Blood Culture adequate volume   Culture   Final    NO GROWTH 2 DAYS Performed at [facility], [address], [place], [place]  [address]    Report Status PENDING  Incomplete  Culture, blood (routine x 2)     Status: None (Preliminary result)   Collection Time: 05/02/23 10:08 AM   Specimen: BLOOD  Result Value Ref Range Status   Specimen Description BLOOD RIGHT ANTECUBITAL  Final   Special Requests   Final    BOTTLES DRAWN AEROBIC AND ANAEROBIC Blood Culture adequate volume   Culture   Final    NO GROWTH 2 DAYS Performed at [facility], [address] [place], [place] [address]    Report Status PENDING  Incomplete  Resp panel by RT-PCR (RSV, Flu A&B, Covid) Anterior Nasal Swab     Status: Abnormal   Collection Time: 05/02/23 10:20 AM   Specimen: Anterior Nasal Swab  Result Value Ref Range Status   SARS Coronavirus 2 by RT PCR NEGATIVE NEGATIVE Final   Influenza A by PCR POSITIVE (A) NEGATIVE Final   Influenza B by PCR NEGATIVE NEGATIVE Final    Comment: (NOTE) The Xpert Xpress SARS-CoV-2/FLU/RSV plus assay is intended as an aid in the diagnosis of influenza from Nasopharyngeal swab specimens and should not be used as a sole basis for treatment. Nasal washings and aspirates are unacceptable for Xpert Xpress SARS-CoV-2/FLU/RSV testing.  Fact Sheet for Patients: BloggerCourse.com  Fact Sheet for Healthcare Providers: SeriousBroker.it  This test is not yet approved or cleared by the [place] FDA and has been authorized for detection and/or  diagnosis of SARS-CoV-2 by FDA under an Emergency Use Authorization (EUA). This EUA will remain in effect (meaning this test can be used) for the duration of the COVID-19 declaration under Section 564(b)(1) of the Act, 21 U.S.C. section 360bbb-3(b)(1), unless the authorization is terminated or revoked.     Resp Syncytial Virus by PCR NEGATIVE NEGATIVE Final    Comment: (NOTE) Fact Sheet for Patients: BloggerCourse.com  Fact Sheet for Healthcare Providers: SeriousBroker.it  This test is not yet approved or cleared by the [place] FDA and has been authorized for detection and/or diagnosis of SARS-CoV-2 by FDA under an Emergency Use Authorization (EUA). This EUA will remain in effect (meaning this test can be used) for the duration of the COVID-19 declaration under Section 564(b)(1) of the Act, 21 U.S.C. section 360bbb-3(b)(1), unless the authorization is terminated or revoked.  Performed at [facility], [address], [place], [place] [address]      Labs: BNP (last 3 results) No results for input(s): "BNP" in the last 8760 hours. Basic Metabolic Panel: Recent Labs  Lab 05/02/23 1020 05/03/23 0502 05/04/23 0610  NA 135 134* 137  K 4.2 4.5 4.4  CL 102 101 102  CO2 18* 22 25  GLUCOSE 132* 156* 121*  BUN 21 30* 28*  CREATININE 1.63* 1.62* 1.35*  CALCIUM 9.4 8.9 9.4  MG 2.0  --  2.3  PHOS  --   --  2.6   Liver Function Tests: Recent Labs  Lab 05/02/23 1020 05/03/23 0502  AST 24 22  ALT 15 15  ALKPHOS 79 69  BILITOT 0.6 0.4  PROT 6.7 5.6*  ALBUMIN 3.8 3.0*   No results for input(s): "LIPASE", "AMYLASE" in the last 168 hours. No results for input(s): "AMMONIA" in the last 168 hours. CBC: Recent Labs  Lab 05/02/23 1020 05/03/23 0502 05/04/23 0610  WBC 8.5 12.6* 15.2*  NEUTROABS 6.5  --   --   HGB 14.6 12.3 13.2  HCT 45.0 36.7 39.5  MCV 98.0 95.6 96.1  PLT  133* 124* 136*   Cardiac  Enzymes: No results for input(s): "CKTOTAL", "CKMB", "CKMBINDEX", "TROPONINI" in the last 168 hours. BNP: Invalid input(s): "POCBNP" CBG: Recent Labs  Lab 05/03/23 1121 05/03/23 1605 05/03/23 2234 05/04/23 0815 05/04/23 1156  GLUCAP 74 169* 132* 107* 120*   D-Dimer No results for input(s): "DDIMER" in the last 72 hours. Hgb A1c No results for input(s): "HGBA1C" in the last 72 hours. Lipid Profile No results for input(s): "CHOL", "HDL", "LDLCALC", "TRIG", "CHOLHDL", "LDLDIRECT" in the last 72 hours. Thyroid function studies No results for input(s): "TSH", "T4TOTAL", "T3FREE", "THYROIDAB" in the last 72 hours.  Invalid input(s): "FREET3" Anemia work up No results for input(s): "VITAMINB12", "FOLATE", "FERRITIN", "TIBC", "IRON", "RETICCTPCT" in the last 72 hours. Urinalysis    Component Value Date/Time   COLORURINE YELLOW 01/14/2023 0928   APPEARANCEUR CLEAR 01/14/2023 0928   APPEARANCEUR Cloudy (A) 12/17/2013 0906   LABSPEC 1.010 01/14/2023 0928   PHURINE 6.0 01/14/2023 0928   GLUCOSEU >=1000 (A) 01/14/2023 0928   HGBUR NEGATIVE 01/14/2023 0928   BILIRUBINUR NEGATIVE 01/14/2023 0928   BILIRUBINUR neg 08/19/2022 1603   BILIRUBINUR Negative 12/17/2013 0906   KETONESUR NEGATIVE 01/14/2023 0928   PROTEINUR 30 (A) 12/19/2022 2203   UROBILINOGEN 0.2 01/14/2023 0928   NITRITE NEGATIVE 01/14/2023 0928   LEUKOCYTESUR NEGATIVE 01/14/2023 0928   Sepsis Labs Recent Labs  Lab 05/02/23 1020 05/03/23 0502 05/04/23 0610  WBC 8.5 12.6* 15.2*   Microbiology Recent Results (from the past 240 hours)  Culture, blood (routine x 2)     Status: None (Preliminary result)   Collection Time: 05/02/23 10:03 AM   Specimen: BLOOD  Result Value Ref Range Status   Specimen Description BLOOD BLOOD RIGHT FOREARM  Final   Special Requests   Final    BOTTLES DRAWN AEROBIC AND ANAEROBIC Blood Culture adequate volume   Culture   Final    NO GROWTH 2 DAYS Performed at [facility],  [address] [place], [place] [address]    Report Status PENDING  Incomplete  Culture, blood (routine x 2)     Status: None (Preliminary result)   Collection Time: 05/02/23 10:08 AM   Specimen: BLOOD  Result Value Ref Range Status   Specimen Description BLOOD RIGHT ANTECUBITAL  Final   Special Requests   Final    BOTTLES DRAWN AEROBIC AND ANAEROBIC Blood Culture adequate volume   Culture   Final    NO GROWTH 2 DAYS Performed at [facility], [address] [place], [place] [address]    Report Status PENDING  Incomplete  Resp panel by RT-PCR (RSV, Flu A&B, Covid) Anterior Nasal Swab     Status: Abnormal   Collection Time: 05/02/23 10:20 AM   Specimen: Anterior Nasal Swab  Result Value Ref Range Status   SARS Coronavirus 2 by RT PCR NEGATIVE NEGATIVE Final   Influenza A by PCR POSITIVE (A) NEGATIVE Final   Influenza B by PCR NEGATIVE NEGATIVE Final    Comment: (NOTE) The Xpert Xpress SARS-CoV-2/FLU/RSV plus assay is intended as an aid in the diagnosis of influenza from Nasopharyngeal swab specimens and should not be used as a sole basis for treatment. Nasal washings and aspirates are unacceptable for Xpert Xpress SARS-CoV-2/FLU/RSV testing.  Fact Sheet for Patients: BloggerCourse.com  Fact Sheet for Healthcare Providers: SeriousBroker.it  This test is not yet approved or cleared by the [place] FDA and has been authorized for detection and/or diagnosis of SARS-CoV-2 by FDA under an Emergency  Use Authorization (EUA). This EUA will remain in effect (meaning this test can be used) for the duration of the COVID-19 declaration under Section 564(b)(1) of the Act, 21 U.S.C. section 360bbb-3(b)(1), unless the authorization is terminated or revoked.     Resp Syncytial Virus by PCR NEGATIVE NEGATIVE Final    Comment: (NOTE) Fact Sheet for Patients: BloggerCourse.com  Fact Sheet for Healthcare  Providers: SeriousBroker.it  This test is not yet approved or cleared by the [place] FDA and has been authorized for detection and/or diagnosis of SARS-CoV-2 by FDA under an Emergency Use Authorization (EUA). This EUA will remain in effect (meaning this test can be used) for the duration of the COVID-19 declaration under Section 564(b)(1) of the Act, 21 U.S.C. section 360bbb-3(b)(1), unless the authorization is terminated or revoked.  Performed at [facility], [address] [place], [place] [address]      Time coordinating discharge: Over 30 minutes  SIGNED:   Willeen Niece, MD  [facility] 05/04/2023, 2:35 PM Pager   If 7PM-7AM, please contact night-coverage

## 2023-05-05 LAB — GLUCOSE, CAPILLARY: Glucose-Capillary: 287 mg/dL — ABNORMAL HIGH (ref 70–99)

## 2023-05-06 ENCOUNTER — Telehealth: Payer: Self-pay

## 2023-05-06 NOTE — Transitions of Care (Post Inpatient/ED Visit) (Signed)
   05/06/2023  Name: Penny Miller MRN: [id number] DOB: 01/06/1932  Today's TOC FU Call Status: Today's TOC FU Call Status:: Unsuccessful Call (1st Attempt) Unsuccessful Call (1st Attempt) Date: 05/06/23  Attempted to reach the patient regarding the most recent Inpatient/ED visit.  Follow Up Plan: Additional outreach attempts will be made to reach the patient to complete the Transitions of Care (Post Inpatient/ED visit) call.   Signature Avelina Essex, CMA (AAMA)  [facility] [phone]

## 2023-05-07 LAB — CULTURE, BLOOD (ROUTINE X 2)
Culture: NO GROWTH
Culture: NO GROWTH
Special Requests: ADEQUATE
Special Requests: ADEQUATE

## 2023-05-12 ENCOUNTER — Other Ambulatory Visit: Payer: Self-pay | Admitting: Internal Medicine

## 2023-05-12 DIAGNOSIS — F325 Major depressive disorder, single episode, in full remission: Secondary | ICD-10-CM

## 2023-05-13 ENCOUNTER — Inpatient Hospital Stay: Payer: Medicare Other | Admitting: Internal Medicine

## 2023-05-13 ENCOUNTER — Other Ambulatory Visit: Payer: Medicare Other

## 2023-05-14 NOTE — Transitions of Care (Post Inpatient/ED Visit) (Signed)
   05/14/2023  Name: FALLEN CRISOSTOMO MRN: [id number] DOB: 1931/07/14  Today's TOC FU Call Status: Today's TOC FU Call Status:: Unsuccessful Call (1st Attempt) Unsuccessful Call (1st Attempt) Date: 05/06/23  Attempted to reach the patient regarding the most recent Inpatient/ED visit.  Follow Up Plan: No further outreach attempts will be made at this time. We have been unable to contact the patient.  Signature Agnes Lawrence, CMA (AAMA)  [facility] [phone]

## 2023-05-16 ENCOUNTER — Ambulatory Visit (INDEPENDENT_AMBULATORY_CARE_PROVIDER_SITE_OTHER): Payer: Medicare Other

## 2023-05-16 DIAGNOSIS — I495 Sick sinus syndrome: Secondary | ICD-10-CM

## 2023-05-16 LAB — CUP PACEART REMOTE DEVICE CHECK
Battery Remaining Longevity: 145 mo
Battery Voltage: 3.05 V
Brady Statistic AP VP Percent: 25.49 %
Brady Statistic AP VS Percent: 54.48 %
Brady Statistic AS VP Percent: 13.82 %
Brady Statistic AS VS Percent: 6.11 %
Brady Statistic RA Percent Paced: 53.98 %
Brady Statistic RV Percent Paced: 58.26 %
Date Time Interrogation Session: 20250214003822
Implantable Lead Connection Status: 753985
Implantable Lead Connection Status: 753985
Implantable Lead Implant Date: 20240214
Implantable Lead Implant Date: 20240214
Implantable Lead Location: 753859
Implantable Lead Location: 753860
Implantable Lead Model: 3830
Implantable Lead Model: 5076
Implantable Pulse Generator Implant Date: 20240214
Lead Channel Impedance Value: 323 Ohm
Lead Channel Impedance Value: 456 Ohm
Lead Channel Impedance Value: 570 Ohm
Lead Channel Impedance Value: 608 Ohm
Lead Channel Pacing Threshold Amplitude: 0.5 V
Lead Channel Pacing Threshold Amplitude: 0.625 V
Lead Channel Pacing Threshold Pulse Width: 0.4 ms
Lead Channel Pacing Threshold Pulse Width: 0.4 ms
Lead Channel Sensing Intrinsic Amplitude: 0.625 mV
Lead Channel Sensing Intrinsic Amplitude: 0.625 mV
Lead Channel Sensing Intrinsic Amplitude: 9.125 mV
Lead Channel Sensing Intrinsic Amplitude: 9.125 mV
Lead Channel Setting Pacing Amplitude: 1.5 V
Lead Channel Setting Pacing Amplitude: 2.5 V
Lead Channel Setting Pacing Pulse Width: 0.4 ms
Lead Channel Setting Sensing Sensitivity: 1.2 mV
Zone Setting Status: 755011

## 2023-05-18 ENCOUNTER — Encounter: Payer: Self-pay | Admitting: Internal Medicine

## 2023-05-20 ENCOUNTER — Ambulatory Visit: Payer: Medicare Other | Admitting: Internal Medicine

## 2023-05-20 ENCOUNTER — Encounter: Payer: Self-pay | Admitting: Internal Medicine

## 2023-05-20 ENCOUNTER — Telehealth: Payer: Self-pay

## 2023-05-20 ENCOUNTER — Other Ambulatory Visit: Payer: Medicare Other

## 2023-05-20 ENCOUNTER — Ambulatory Visit (INDEPENDENT_AMBULATORY_CARE_PROVIDER_SITE_OTHER): Payer: Medicare Other

## 2023-05-20 VITALS — BP 122/78 | HR 87 | Temp 97.7°F | Resp 16 | Ht 67.0 in | Wt 152.2 lb

## 2023-05-20 DIAGNOSIS — E039 Hypothyroidism, unspecified: Secondary | ICD-10-CM | POA: Diagnosis not present

## 2023-05-20 DIAGNOSIS — N184 Chronic kidney disease, stage 4 (severe): Secondary | ICD-10-CM | POA: Diagnosis not present

## 2023-05-20 DIAGNOSIS — J189 Pneumonia, unspecified organism: Secondary | ICD-10-CM

## 2023-05-20 DIAGNOSIS — Z950 Presence of cardiac pacemaker: Secondary | ICD-10-CM | POA: Diagnosis not present

## 2023-05-20 DIAGNOSIS — E538 Deficiency of other specified B group vitamins: Secondary | ICD-10-CM

## 2023-05-20 DIAGNOSIS — D696 Thrombocytopenia, unspecified: Secondary | ICD-10-CM | POA: Diagnosis not present

## 2023-05-20 DIAGNOSIS — J4540 Moderate persistent asthma, uncomplicated: Secondary | ICD-10-CM

## 2023-05-20 LAB — CBC WITH DIFFERENTIAL/PLATELET
Basophils Absolute: 0.1 10*3/uL (ref 0.0–0.1)
Basophils Relative: 0.6 % (ref 0.0–3.0)
Eosinophils Absolute: 0.1 10*3/uL (ref 0.0–0.7)
Eosinophils Relative: 1.4 % (ref 0.0–5.0)
HCT: 41.4 % (ref 36.0–46.0)
Hemoglobin: 13.7 g/dL (ref 12.0–15.0)
Lymphocytes Relative: 15.4 % (ref 12.0–46.0)
Lymphs Abs: 1.3 10*3/uL (ref 0.7–4.0)
MCHC: 33.2 g/dL (ref 30.0–36.0)
MCV: 97.8 fL (ref 78.0–100.0)
Monocytes Absolute: 0.7 10*3/uL (ref 0.1–1.0)
Monocytes Relative: 8.5 % (ref 3.0–12.0)
Neutro Abs: 6.2 10*3/uL (ref 1.4–7.7)
Neutrophils Relative %: 74.1 % (ref 43.0–77.0)
Platelets: 154 10*3/uL (ref 150.0–400.0)
RBC: 4.23 Mil/uL (ref 3.87–5.11)
RDW: 14.6 % (ref 11.5–15.5)
WBC: 8.4 10*3/uL (ref 4.0–10.5)

## 2023-05-20 LAB — VITAMIN B12: Vitamin B-12: 249 pg/mL (ref 211–911)

## 2023-05-20 LAB — FOLATE: Folate: 9.2 ng/mL (ref >5.9)

## 2023-05-20 LAB — TSH: TSH: 2.9 u[IU]/mL (ref 0.35–5.50)

## 2023-05-20 MED ORDER — HYDROCODONE BIT-HOMATROP MBR 5-1.5 MG/5ML PO SOLN: 5.0000 mL | Freq: Three times a day (TID) | ORAL | 0 refills | Status: AC | PRN

## 2023-05-20 MED ORDER — ALBUTEROL SULFATE (2.5 MG/3ML) 0.083% IN NEBU: 2.5000 mg | INHALATION_SOLUTION | Freq: Four times a day (QID) | RESPIRATORY_TRACT | 3 refills | Status: AC | PRN

## 2023-05-20 NOTE — Progress Notes (Unsigned)
Subjective:  Patient ID: Penny Miller, female    DOB: 1931-10-04  Age: 89 y.o. MRN: [id number]  CC: Cough, Hypothyroidism, and Hypertension   HPI Penny Miller presents for f/up ----  Discussed the use of AI scribe software for clinical note transcription with the patient, who gave verbal consent to proceed.  History of Present Illness   Penny Miller is an 89 year old female who presents with persistent cough and respiratory symptoms following a recent episode of pneumonia.  She has a persistent cough productive of yellow phlegm without hemoptysis. No night sweats, fever, or chills. She describes a sensation of food being stuck or congestion at night. She completed a course of steroids and is not currently on antibiotics. She is on a medication prescribed by her gynecologist for her kidneys and bladder, initially thought to be an antibiotic. Her weight is stable at 150 pounds, and her appetite has been good.  She experienced a sudden onset of a bruise on her left hand, likely due to a broken vessel while wringing out a wash rag, which is unusual for her.       Outpatient Medications Prior to Visit  Medication Sig Dispense Refill   acetaminophen (TYLENOL) 500 MG tablet Take 500 mg by mouth every 6 (six) hours as needed (pain).     amLODipine (NORVASC) 2.5 MG tablet Take 2.5 mg by mouth at bedtime.     apixaban (ELIQUIS) 2.5 MG TABS tablet Take 1 tablet (2.5 mg total) by mouth 2 (two) times daily. 180 tablet 0   atorvastatin (LIPITOR) 40 MG tablet TAKE 1 TABLET AT BEDTIME 90 tablet 3   Blood Glucose Monitoring Suppl (ONE TOUCH ULTRA 2) w/Device KIT 1 Act by Does not apply route daily. 1 kit 2   citalopram (CELEXA) 10 MG tablet TAKE 1 TABLET DAILY 90 tablet 0   famotidine (PEPCID) 20 MG tablet TAKE 1 TABLET ONCE TO TWICE A DAY (Patient taking differently: Take 20 mg by mouth 2 (two) times daily. TAKE 1 TABLET ONCE TO TWICE A DAY) 60 tablet 5   FARXIGA 10 MG TABS tablet TAKE 1 TABLET  DAILY BEFORE BREAKFAST 90 tablet 1   ferrous sulfate 325 (65 FE) MG EC tablet Take 325 mg by mouth daily with breakfast. With Vitamin C     gabapentin (NEURONTIN) 300 MG capsule TAKE 1 CAPSULE THREE TIMES A DAY 270 capsule 3   glucose blood test strip 1 each by Other route daily. Use as instructed 100 each 5   Lancets (ONETOUCH DELICA PLUS LANCET33G) MISC 1 Act by Does not apply route daily. 100 each 5   levocetirizine (XYZAL) 5 MG tablet TAKE ONE-HALF (1/2) TABLET (2.5 MG) EVERY OTHER DAY (Patient taking differently: Take 2.5 mg by mouth every other day.) 23 tablet 3   predniSONE (DELTASONE) 50 MG tablet Advised to take prednisone for 3 more days to complete 5-day treatment. 3 tablet 0   trimethoprim (TRIMPEX) 100 MG tablet Take 100 mg by mouth daily.     Vitamin D, Ergocalciferol, (DRISDOL) 1.25 MG (50000 UNIT) CAPS capsule TAKE 1 CAPSULE EVERY 7 DAYS (Patient taking differently: Take 50,000 Units by mouth every 7 (seven) days.) 6 capsule 0   clotrimazole-betamethasone (LOTRISONE) cream Apply 1 Application topically daily. 30 g 1   No facility-administered medications prior to visit.    ROS Review of Systems  Respiratory:  Positive for cough and wheezing.   Cardiovascular:  Negative for chest pain, palpitations and leg swelling.  Gastrointestinal:  Negative for abdominal pain, blood in stool, diarrhea, nausea and vomiting.  Musculoskeletal:  Positive for gait problem.  Skin: Negative.   Hematological:  Negative for adenopathy. Bruises/bleeds easily.  Psychiatric/Behavioral:  Positive for confusion and decreased concentration. Negative for dysphoric mood.     Objective:  BP 122/78 (BP Location: Left Arm, Patient Position: Sitting, Cuff Size: Normal)   Pulse 87   Temp 97.7 F (36.5 C) (Oral)   Resp 16   Ht 5\' 7"  (1.702 m)   Wt 152 lb 3.2 oz (69 kg)   SpO2 94%   BMI 23.84 kg/m   BP Readings from Last 3 Encounters:  05/20/23 122/78  05/04/23 (!) 148/75  01/14/23 122/76    Wt  Readings from Last 3 Encounters:  05/20/23 152 lb 3.2 oz (69 kg)  05/02/23 150 lb (68 kg)  01/14/23 148 lb (67.1 kg)    Physical Exam  Lab Results  Component Value Date   WBC 15.2 (H) 05/04/2023   HGB 13.2 05/04/2023   HCT 39.5 05/04/2023   PLT 136 (L) 05/04/2023   GLUCOSE 121 (H) 05/04/2023   CHOL 166 05/28/2021   TRIG 192.0 (H) 05/28/2021   HDL 44.10 05/28/2021   LDLCALC 84 05/28/2021   ALT 15 05/03/2023   AST 22 05/03/2023   NA 137 05/04/2023   K 4.4 05/04/2023   CL 102 05/04/2023   CREATININE 1.35 (H) 05/04/2023   BUN 28 (H) 05/04/2023   CO2 25 05/04/2023   TSH 1.392 12/20/2022   INR 1.2 07/16/2021   HGBA1C 6.5 12/30/2022   MICROALBUR 5.5 (H) 12/30/2022    DG Chest Portable 1 View Result Date: 05/02/2023 CLINICAL DATA:  Shortness of breath EXAM: PORTABLE CHEST 1 VIEW COMPARISON:  Chest radiograph dated 12/19/2022 FINDINGS: Lines/tubes: Left chest wall pacemaker leads project over the right atrium and ventricle. Lungs: Well inflated lungs. Left mid lung linear opacities. Pleura: No pneumothorax or pleural effusion. Heart/mediastinum: Similar  cardiomediastinal silhouette. Bones: No acute osseous abnormality. IMPRESSION: Left mid lung linear opacities, likely atelectasis/scarring. No focal consolidations. Electronically Signed   By: Agustin Cree M.D.   On: 05/02/2023 10:48    DG Chest 2 View Result Date: 05/20/2023 CLINICAL DATA:  Pneumonia. EXAM: CHEST - 2 VIEW COMPARISON:  May 02, 2023. FINDINGS: Stable cardiomediastinal silhouette. Left-sided pacemaker is unchanged. Lungs are clear. Bony thorax is unremarkable. IMPRESSION: No active cardiopulmonary disease. Electronically Signed   By: Lupita Raider M.D.   On: 05/20/2023 14:35     Assessment & Plan:   Pneumonia of left lower lobe due to infectious organism -     DG Chest 2 View; Future -     HYDROcodone Bit-Homatrop MBr; Take 5 mLs by mouth every 8 (eight) hours as needed for up to 8 days for cough.  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  Sanda Linger, MD

## 2023-05-20 NOTE — Patient Instructions (Signed)
 Community-Acquired Pneumonia, Adult Pneumonia is a lung infection that causes inflammation and the buildup of mucus and fluids in the lungs. This may cause coughing and difficulty breathing. Community-acquired pneumonia is pneumonia that develops in people who are not, and have not recently been, in a hospital or other health care facility. Usually, pneumonia develops as a result of an illness that is caused by a virus, such as the common cold and the flu (influenza). It can also be caused by bacteria or fungi. While the common cold and influenza can pass from person to person (are contagious), pneumonia itself is not considered contagious. What are the causes? This condition may be caused by: Viruses. Bacteria. Fungi. What increases the risk? The following factors may make you more likely to develop this condition: Being over age 17 or having certain medical conditions, such as: A long-term (chronic) disease, such as: chronic obstructive pulmonary disease (COPD), asthma, heart failure, diabetes, or kidney disease. A condition that increases the risk of breathing in (aspirating) mucus and other fluids from your mouth and nose. A weakened body defense system (immune system). Having had your spleen removed (splenectomy). The spleen is the organ that helps fight germs and infections. Not cleaning your teeth and gums well (poor dental hygiene). Using tobacco products. Traveling to places where germs that cause pneumonia are present or being near certain animals or animal habitats that could have germs that cause pneumonia. What are the signs or symptoms? Symptoms of this condition include: A dry cough or a wet (productive) cough. A fever, sweating, or chills. Chest pain, especially when breathing deeply or coughing. Fast breathing, difficulty breathing, or shortness of breath. Tiredness (fatigue) and muscle aches. How is this diagnosed? This condition may be diagnosed based on your medical  history or a physical exam. You may also have tests, including: Imaging, such as a chest X-ray or lung ultrasound. Tests of: The level of oxygen and other gases in your blood. Mucus from your lungs (sputum). Fluid around your lungs (pleural fluid). Your urine. How is this treated? Treatment for this condition depends on many factors, such as the cause of your pneumonia, your medicines, and other medical conditions that you have. For most adults, pneumonia may be treated at home. In some cases, treatment must happen in a hospital and may include: Medicines that are given by mouth (orally) or through an IV, including: Antibiotic medicines, if bacteria caused the pneumonia. Medicines that kill viruses (antiviral medicines), if a virus caused the pneumonia. Oxygen therapy. Severe pneumonia, although rare, may require the following treatments: Mechanical ventilation.This procedure uses a machine to help you breathe if you cannot breathe well on your own or maintain a safe level of blood oxygen. Thoracentesis. This procedure removes any buildup of pleural fluid to help with breathing. Follow these instructions at home:  Medicines Take over-the-counter and prescription medicines only as told by your health care provider. Take cough medicine only if you have trouble sleeping. Cough medicine can prevent your body from removing mucus from your lungs. If you were prescribed antibiotics, take them as told by your health care provider. Do not stop taking the antibiotic even if you start to feel better. Lifestyle     Do not drink alcohol. Do not use any products that contain nicotine or tobacco. These products include cigarettes, chewing tobacco, and vaping devices, such as e-cigarettes. If you need help quitting, ask your health care provider. Eat a healthy diet. This includes plenty of vegetables, fruits, whole grains, low-fat  dairy products, and lean protein. General instructions Rest a lot and  get at least 8 hours of sleep each night. Sleep in a partly upright position at night. Place a few pillows under your head or sleep in a reclining chair. Return to your normal activities as told by your health care provider. Ask your health care provider what activities are safe for you. Drink enough fluid to keep your urine pale yellow. This helps to thin the mucus in your lungs. If your throat is sore, gargle with a mixture of salt and water 3-4 times a day or as needed. To make salt water, completely dissolve -1 tsp (3-6 g) of salt in 1 cup (237 mL) of warm water. Keep all follow-up visits. How is this prevented? You can lower your risk of developing community-acquired pneumonia by: Getting the pneumonia vaccine. There are different types and schedules of pneumonia vaccines. Ask your health care provider which option is best for you. Consider getting the pneumonia vaccine if: You are older than 88 years of age. You are 60-63 years of age and are receiving cancer treatment, have chronic lung disease, or have other medical conditions that affect your immune system. Ask your health care provider if this applies to you. Getting your influenza vaccine every year. Ask your health care provider which type of vaccine is best for you. Getting regular dental checkups. Washing your hands often with soap and water for at least 20 seconds. If soap and water are not available, use hand sanitizer. Contact a health care provider if: You have a fever. You have trouble sleeping because you cannot control your cough with cough medicine. Get help right away if: Your shortness of breath becomes worse. Your chest pain increases. Your sickness becomes worse, especially if you are an older adult or have a weak immune system. You cough up blood. These symptoms may be an emergency. Get help right away. Call 911. Do not wait to see if the symptoms will go away. Do not drive yourself to the  hospital. Summary Pneumonia is an infection of the lungs. Community-acquired pneumonia develops in people who have not been in the hospital. It can be caused by bacteria, viruses, or fungi. This condition may be treated with antibiotics or antiviral medicines. Severe pneumonia may require a hospital stay and treatment to help with breathing. This information is not intended to replace advice given to you by your health care provider. Make sure you discuss any questions you have with your health care provider. Document Revised: 12/12/2022 Document Reviewed: 05/16/2021 Elsevier Patient Education  2024 [employer].

## 2023-05-20 NOTE — Telephone Encounter (Signed)
Copied from CRM [id number]. Topic: Clinical - Prescription Issue >> May 20, 2023  1:26 PM Denese Killings wrote: Reason for CRM: [facility] Ivory [facility] wants to let the doctor know that he is going to take the quantity down to 75 ml from 120 for HYDROcodone bit-homatropine (HYCODAN) 5-1.5 MG/5ML syrup. He stated that patient cannot have more than 5 day supply on first time having prescription.

## 2023-05-21 NOTE — Telephone Encounter (Signed)
 FYI

## 2023-05-22 ENCOUNTER — Telehealth: Payer: Self-pay | Admitting: Gastroenterology

## 2023-05-22 MED ORDER — FAMOTIDINE 20 MG PO TABS: ORAL_TABLET | ORAL | 2 refills | Status: DC

## 2023-05-22 NOTE — Telephone Encounter (Signed)
Refill of famotidine sent to pharmacy. Patient last seen 10-2022.  Will need office visit in August 2025

## 2023-05-22 NOTE — Telephone Encounter (Signed)
Patient called and stated that she is needing a refill for Famotidine 20 MG. Patient stated that she would like that medication to over to [facility]. Please advise.

## 2023-05-23 ENCOUNTER — Other Ambulatory Visit: Payer: Self-pay | Admitting: Internal Medicine

## 2023-05-23 DIAGNOSIS — J4540 Moderate persistent asthma, uncomplicated: Secondary | ICD-10-CM

## 2023-05-23 NOTE — Telephone Encounter (Signed)
Copied from CRM [id number]. Topic: Clinical - Medication Refill >> May 23, 2023  3:54 PM Pascal Lux wrote: Most Recent Primary Care Visit:  Provider: Corwin Levins  Department: [facility]  Visit Type: OFFICE VISIT  Date: 01/14/2023  Medication: Clotrimazole Betamethasone (Lotrisone)   Has the patient contacted their pharmacy? Yes (Agent: If no, request that the patient contact the pharmacy for the refill. If patient does not wish to contact the pharmacy document the reason why and proceed with request.) (Agent: If yes, when and what did the pharmacy advise?) They advised to call provider.  Is this the correct pharmacy for this prescription? Yes If no, delete pharmacy and type the correct one.  This is the patient's preferred pharmacy:  [facility] - [place], [place] - [address] [place] [address] Phone: [phone] Fax: [phone]   [facility] [address] [place] - [address] [place] [address] Phone: [phone] Fax: [phone]   Has the prescription been filled recently? No  Is the patient out of the medication? Yes  Has the patient been seen for an appointment in the last year OR does the patient have an upcoming appointment? Yes  Can we respond through MyChart? Yes  Agent: Please be advised that Rx refills may take up to 3 business days. We ask that you follow-up with your pharmacy.

## 2023-06-02 ENCOUNTER — Other Ambulatory Visit: Payer: Self-pay | Admitting: Internal Medicine

## 2023-06-11 DIAGNOSIS — N3946 Mixed incontinence: Secondary | ICD-10-CM | POA: Diagnosis not present

## 2023-06-11 DIAGNOSIS — R350 Frequency of micturition: Secondary | ICD-10-CM | POA: Diagnosis not present

## 2023-06-16 NOTE — Progress Notes (Signed)
 Remote pacemaker transmission.

## 2023-07-16 DIAGNOSIS — N3946 Mixed incontinence: Secondary | ICD-10-CM | POA: Diagnosis not present

## 2023-07-23 ENCOUNTER — Other Ambulatory Visit: Payer: Self-pay | Admitting: Internal Medicine

## 2023-08-07 ENCOUNTER — Other Ambulatory Visit: Payer: Self-pay | Admitting: Internal Medicine

## 2023-08-07 DIAGNOSIS — E1142 Type 2 diabetes mellitus with diabetic polyneuropathy: Secondary | ICD-10-CM

## 2023-08-07 DIAGNOSIS — M5416 Radiculopathy, lumbar region: Secondary | ICD-10-CM

## 2023-08-08 ENCOUNTER — Telehealth: Payer: Self-pay | Admitting: Internal Medicine

## 2023-08-08 ENCOUNTER — Other Ambulatory Visit: Payer: Self-pay

## 2023-08-08 DIAGNOSIS — E1142 Type 2 diabetes mellitus with diabetic polyneuropathy: Secondary | ICD-10-CM

## 2023-08-08 DIAGNOSIS — M5416 Radiculopathy, lumbar region: Secondary | ICD-10-CM

## 2023-08-08 MED ORDER — GABAPENTIN 300 MG PO CAPS: 300.0000 mg | ORAL_CAPSULE | Freq: Three times a day (TID) | ORAL | 0 refills | Status: DC

## 2023-08-08 NOTE — Telephone Encounter (Signed)
 Medication refill has been sent to Dr. Yetta Barre

## 2023-08-08 NOTE — Telephone Encounter (Unsigned)
 Copied from CRM [id number]. Topic: Clinical - Prescription Issue >> Aug 08, 2023  2:24 PM Kita Perish H wrote: Reason for CRM: Patient would like to have prescription for her gabapentin  (NEURONTIN ) 300 MG capsule [facility] this time since she is out and it will take 7 days to get medication.  [id number] [place], [place] - [address] [place] [address] Phone: [phone] Fax: [phone] Hours: Not open 24 hours  Terrea Ferrier [phone]

## 2023-08-11 ENCOUNTER — Other Ambulatory Visit: Payer: Self-pay | Admitting: Internal Medicine

## 2023-08-11 ENCOUNTER — Ambulatory Visit: Payer: Self-pay

## 2023-08-11 ENCOUNTER — Ambulatory Visit: Admitting: Family Medicine

## 2023-08-11 DIAGNOSIS — M5416 Radiculopathy, lumbar region: Secondary | ICD-10-CM

## 2023-08-11 DIAGNOSIS — F325 Major depressive disorder, single episode, in full remission: Secondary | ICD-10-CM

## 2023-08-11 DIAGNOSIS — E1142 Type 2 diabetes mellitus with diabetic polyneuropathy: Secondary | ICD-10-CM

## 2023-08-11 MED ORDER — GABAPENTIN 300 MG PO CAPS: 300.0000 mg | ORAL_CAPSULE | Freq: Three times a day (TID) | ORAL | 0 refills | Status: DC

## 2023-08-11 NOTE — Telephone Encounter (Signed)
 [facility] [address] until 5.15.25 and will take 3-5 business days to arrive.   Pt is requesting a short [facility] of [facility]:   [id number] [place], [place] - [address]  [address] Phone: [phone] Fax: [phone] Hours: Not open 24 hours

## 2023-08-11 NOTE — Telephone Encounter (Signed)
 7 day supply has been sent into her local pharmacy. Patient son has been made aware.

## 2023-08-11 NOTE — Telephone Encounter (Signed)
 Chief Complaint: withdrawal from gabapentin  Symptoms: weakness, nausea, itching, anxiety, dysuria, diarrhea Frequency: 5 days Pertinent Negatives: Patient denies CP, falls, vomiting  Disposition: [] ED /[] Urgent Care (no appt availability in office) / [x] Appointment(In office/virtual)/ []  [facility]/ [] Home Care/ [] Refused Recommended Disposition /[] [facility]/ []  Follow-up with PCP Additional Notes: RN spoke to pt's son. Pt's son states pt has not had gabapentin  for 5 days and he believes she is withdrawing. RN spoke to pt's pharmacy and the CAL. [facility] received the prescription and it will be sent out 5/15. CAL stated they will send a short-term supply of the med [facility] a local [facility].  Son states pt has itching, nausea, diarrhea, anxiety, weakness, and back pain. Pt is still able to stand and walk but appears more weak. Son states pt has dysuria and states it is a chronic issue and she is being treated with antibiotics per urology. Son states pt is anxious. Pt stated "I feel like I want to hurt somebody." Per son, pt has not reported any intent to hurt herself or others and is not displaying unsafe or violent behavior. Pt has eaten two pieces of toast in two days with nausea but denies vomiting. Endorses diarrhea with some incontinence at baseline.  Due to pt's worsening weakness, anxiety, and diarrhea RN advised son pt should be seen by a HCP to rule out life-threatening cause of illness to which son was agreeable. RN scheduled pt for 1500 today and advised son if the pt becomes more weak, is unable to walk, has CP or SOB, or begins vomiting she should go to the ED. Son verbalized understanding.     Reason for Disposition  [1] MODERATE weakness (i.e., interferes with work, school, normal activities) AND [2] cause unknown  (Exceptions: Weakness from acute minor illness or poor fluid intake; weakness is chronic and not worse.)  Answer Assessment - Initial Assessment  Questions 1. DESCRIPTION: "Describe how you are feeling."     Pt is withdrawing from gabapentin , son endorses she is weak 2. SEVERITY: "How bad is it?"  "Can you stand and walk?"   - MILD (0-3): Feels weak or tired, but does not interfere with work, school or normal activities.   - MODERATE (4-7): Able to stand and walk; weakness interferes with work, school, or normal activities.   - SEVERE (8-10): Unable to stand or walk; unable to do usual activities.     Moderate  3. ONSET: "When did these symptoms begin?" (e.g., hours, days, weeks, months)     Last had gabapentin  5 days ago 4. CAUSE: "What do you think is causing the weakness or fatigue?" (e.g., not drinking enough fluids, medical problem, trouble sleeping)     Withdrawing from gabapentin , loss of appetite  5. NEW MEDICINES:  "Have you started on any new medicines recently?" (e.g., opioid pain medicines, benzodiazepines, muscle relaxants, antidepressants, antihistamines, neuroleptics, beta blockers)     Has not had gabapentin  for 5 days 6. OTHER SYMPTOMS: "Do you have any other symptoms?" (e.g., chest pain, fever, cough, SOB, vomiting, diarrhea, bleeding, other areas of pain)     Itching, nausea, weakness, anxiety, back pain. "She feels like she wants to hurt somebody."  Son denies she is exhibiting unsafe or violent behavior. "The first time she went off this drug she was itching really bad and all out of sorts", "this time it has affected her much worse." Pt has only eaten two pieces of toast in two days. Endorses nausea but no  vomiting. She is drinking fluids.   Pt is able to stand and walk. "She walks from her bedroom to the bathroom." Per son, pt appears very weak. "She hasn't run a fever as far as I know, I felt her head and she wasn't hot. She thought she might have had a virus. It doesn't appear that she has a cold. I think she's had some diarrhea. She has incontinence issues anyways." "Pee burns a little"  Saw urology recently,  taking antibiotic to prevent UTIs  Protocols used: Weakness (Generalized) and Fatigue-A-AH

## 2023-08-11 NOTE — Telephone Encounter (Signed)
 Pt requested a refill of gabapentin . Per chart review there is receipt confirmation from the pharmacy 5/9 w/ [facility]. RN called the pt and spoke to her son. Son states the pt has not had gabapentin  in 5 days and is experiencing symptoms of withdrawal. RN called [facility]. They stated the medication will be shipped out 5/15. RN called the CAL and asked if they could send a short-term supply of [facility] [address] CAL advised they could do that. RN advised pt's son to follow-up with the pharmacy. It appears pt also needs a refill of citalopram  which son confirmed.  In regards to pt's withdrawal symptoms, see Nurse Triage encounter.

## 2023-08-11 NOTE — Telephone Encounter (Signed)
 Copied from CRM [id number]. Topic: Clinical - Medication Refill >> Aug 11, 2023  8:46 AM Jalayah J wrote: Medication: gabapentin  (NEURONTIN ) 300 MG capsule  Has the patient contacted their pharmacy? Yes (Agent: If no, request that the patient contact the pharmacy for the refill. If patient does not wish to contact the pharmacy document the reason why and proceed with request.) (Agent: If yes, when and what did the pharmacy advise?)   [id number] [place], [place] - [address] [place] [address] Phone: [phone] Fax: [phone]  Is this the correct pharmacy for this prescription? Yes If no, delete pharmacy and type the correct one.   Has the prescription been filled recently? Yes  Is the patient out of the medication? Yes  Has the patient been seen for an appointment in the last year OR does the patient have an upcoming appointment? Yes  Can we respond through MyChart? Yes  Agent: Please be advised that Rx refills may take up to 3 business days. We ask that you follow-up with your pharmacy.

## 2023-08-12 ENCOUNTER — Ambulatory Visit (INDEPENDENT_AMBULATORY_CARE_PROVIDER_SITE_OTHER)

## 2023-08-12 ENCOUNTER — Ambulatory Visit: Payer: Self-pay | Admitting: Internal Medicine

## 2023-08-12 ENCOUNTER — Ambulatory Visit: Admitting: Internal Medicine

## 2023-08-12 ENCOUNTER — Encounter: Payer: Self-pay | Admitting: Internal Medicine

## 2023-08-12 VITALS — BP 124/78 | HR 83 | Temp 97.9°F | Resp 16 | Ht 67.0 in | Wt 146.0 lb

## 2023-08-12 DIAGNOSIS — N184 Chronic kidney disease, stage 4 (severe): Secondary | ICD-10-CM

## 2023-08-12 DIAGNOSIS — N1832 Chronic kidney disease, stage 3b: Secondary | ICD-10-CM | POA: Diagnosis not present

## 2023-08-12 DIAGNOSIS — Z7984 Long term (current) use of oral hypoglycemic drugs: Secondary | ICD-10-CM

## 2023-08-12 DIAGNOSIS — R300 Dysuria: Secondary | ICD-10-CM

## 2023-08-12 DIAGNOSIS — E1122 Type 2 diabetes mellitus with diabetic chronic kidney disease: Secondary | ICD-10-CM | POA: Diagnosis not present

## 2023-08-12 DIAGNOSIS — R109 Unspecified abdominal pain: Secondary | ICD-10-CM | POA: Insufficient documentation

## 2023-08-12 DIAGNOSIS — M5416 Radiculopathy, lumbar region: Secondary | ICD-10-CM | POA: Diagnosis not present

## 2023-08-12 DIAGNOSIS — A09 Infectious gastroenteritis and colitis, unspecified: Secondary | ICD-10-CM

## 2023-08-12 DIAGNOSIS — R10A3 Flank pain, bilateral: Secondary | ICD-10-CM

## 2023-08-12 DIAGNOSIS — E1142 Type 2 diabetes mellitus with diabetic polyneuropathy: Secondary | ICD-10-CM

## 2023-08-12 DIAGNOSIS — I700 Atherosclerosis of aorta: Secondary | ICD-10-CM | POA: Diagnosis not present

## 2023-08-12 LAB — CBC WITH DIFFERENTIAL/PLATELET
Basophils Absolute: 0.1 10*3/uL (ref 0.0–0.1)
Basophils Relative: 0.7 % (ref 0.0–3.0)
Eosinophils Absolute: 0 10*3/uL (ref 0.0–0.7)
Eosinophils Relative: 0.3 % (ref 0.0–5.0)
HCT: 47.5 % — ABNORMAL HIGH (ref 36.0–46.0)
Hemoglobin: 15.6 g/dL — ABNORMAL HIGH (ref 12.0–15.0)
Lymphocytes Relative: 13.5 % (ref 12.0–46.0)
Lymphs Abs: 1.5 10*3/uL (ref 0.7–4.0)
MCHC: 32.9 g/dL (ref 30.0–36.0)
MCV: 98.1 fl (ref 78.0–100.0)
Monocytes Absolute: 1 10*3/uL (ref 0.1–1.0)
Monocytes Relative: 9.2 % (ref 3.0–12.0)
Neutro Abs: 8.3 10*3/uL — ABNORMAL HIGH (ref 1.4–7.7)
Neutrophils Relative %: 76.3 % (ref 43.0–77.0)
Platelets: 193 10*3/uL (ref 150.0–400.0)
RBC: 4.84 Mil/uL (ref 3.87–5.11)
RDW: 14.2 % (ref 11.5–15.5)
WBC: 10.9 10*3/uL — ABNORMAL HIGH (ref 4.0–10.5)

## 2023-08-12 LAB — URINALYSIS, ROUTINE W REFLEX MICROSCOPIC
Hgb urine dipstick: NEGATIVE
Ketones, ur: NEGATIVE
Leukocytes,Ua: NEGATIVE
Nitrite: NEGATIVE
RBC / HPF: NONE SEEN (ref 0–?)
Specific Gravity, Urine: 1.015 (ref 1.000–1.030)
Urine Glucose: 500 — AB
Urobilinogen, UA: 0.2 (ref 0.0–1.0)
pH: 6 (ref 5.0–8.0)

## 2023-08-12 LAB — HEPATIC FUNCTION PANEL
ALT: 15 U/L (ref 0–35)
AST: 18 U/L (ref 0–37)
Albumin: 4.4 g/dL (ref 3.5–5.2)
Alkaline Phosphatase: 76 U/L (ref 39–117)
Bilirubin, Direct: 0.1 mg/dL (ref 0.0–0.3)
Total Bilirubin: 0.5 mg/dL (ref 0.2–1.2)
Total Protein: 7 g/dL (ref 6.0–8.3)

## 2023-08-12 LAB — BASIC METABOLIC PANEL WITH GFR
BUN: 35 mg/dL — ABNORMAL HIGH (ref 6–23)
CO2: 23 meq/L (ref 19–32)
Calcium: 9.9 mg/dL (ref 8.4–10.5)
Chloride: 99 meq/L (ref 96–112)
Creatinine, Ser: 2.64 mg/dL — ABNORMAL HIGH (ref 0.40–1.20)
GFR: 15.54 mL/min — ABNORMAL LOW (ref >60.00)
Glucose, Bld: 117 mg/dL — ABNORMAL HIGH (ref 70–99)
Potassium: 4.3 meq/L (ref 3.5–5.1)
Sodium: 132 meq/L — ABNORMAL LOW (ref 135–145)

## 2023-08-12 LAB — LIPASE: Lipase: 40 U/L (ref 11.0–59.0)

## 2023-08-12 LAB — HEMOGLOBIN A1C: Hgb A1c MFr Bld: 6.5 % (ref 4.6–6.5)

## 2023-08-12 LAB — MICROALBUMIN / CREATININE URINE RATIO
Creatinine,U: 177 mg/dL
Microalb Creat Ratio: 54.5 mg/g — ABNORMAL HIGH (ref 0.0–30.0)
Microalb, Ur: 9.6 mg/dL — ABNORMAL HIGH (ref 0.0–1.9)

## 2023-08-12 MED ORDER — GABAPENTIN 100 MG PO CAPS: 100.0000 mg | ORAL_CAPSULE | Freq: Three times a day (TID) | ORAL | 0 refills | Status: DC

## 2023-08-12 MED ORDER — GABAPENTIN 300 MG PO CAPS: 300.0000 mg | ORAL_CAPSULE | Freq: Three times a day (TID) | ORAL | 0 refills | Status: DC

## 2023-08-12 MED ORDER — GABAPENTIN 300 MG PO CAPS: 300.0000 mg | ORAL_CAPSULE | Freq: Three times a day (TID) | ORAL | 1 refills | Status: DC

## 2023-08-12 NOTE — Progress Notes (Unsigned)
 Subjective:  Patient ID: Penny Miller, female    DOB: March 03, 1932  Age: 89 y.o. MRN: [id number]  CC: Fatigue (Since Friday ), Diarrhea (Since Friday ), and Dysuria (Since Friday )   HPI Penny Miller presents for f/up ----  Discussed the use of AI scribe software for clinical note transcription with the patient, who gave verbal consent to proceed.  History of Present Illness   Penny Miller is an 89 year old female who presents with gastrointestinal distress and anxiety after running out of gabapentin . She is accompanied by a family member who provides additional information about her symptoms.  She ran out of gabapentin  last Wednesday, and by Friday morning, she experienced significant distress, including weakness, nausea, and heightened anxiety. This is the second time she has run out of gabapentin , which is prescribed by her current provider.  Gastrointestinal symptoms began around the same time, including diarrhea that occurs shortly after eating, and vomiting, which occurred yesterday. She has been unable to eat more than one meal in the past three to four days. She describes an aversion to the taste of toothpaste, which triggered nausea and vomiting. Abdominal pain intensifies with the urge to defecate, and she feels her stomach is swollen.  She has a history of urinary issues and was previously managed by Dr. Manuel Sell, who prescribed a low-grade antibiotic to prevent bladder infections. She experiences occasional burning during urination.       Outpatient Medications Prior to Visit  Medication Sig Dispense Refill   acetaminophen  (TYLENOL ) 500 MG tablet Take 500 mg by mouth every 6 (six) hours as needed (pain).     albuterol  (PROVENTIL ) (2.5 MG/3ML) 0.083% nebulizer solution Take 3 mLs (2.5 mg total) by nebulization every 6 (six) hours as needed for wheezing or shortness of breath. 150 mL 3   amLODipine  (NORVASC ) 2.5 MG tablet Take 2.5 mg by mouth at bedtime.     apixaban   (ELIQUIS ) 2.5 MG TABS tablet Take 1 tablet (2.5 mg total) by mouth 2 (two) times daily. 180 tablet 0   atorvastatin  (LIPITOR) 40 MG tablet TAKE 1 TABLET AT BEDTIME 90 tablet 3   Blood Glucose Monitoring Suppl (ONE TOUCH ULTRA 2) w/Device KIT 1 Act by Does not apply route daily. 1 kit 2   citalopram  (CELEXA ) 10 MG tablet TAKE 1 TABLET DAILY 90 tablet 0   dapagliflozin  propanediol (FARXIGA ) 10 MG TABS tablet TAKE 1 TABLET DAILY BEFORE BREAKFAST 90 tablet 0   famotidine  (PEPCID ) 20 MG tablet TAKE 1 TABLET ONCE TO TWICE A DAY 60 tablet 2   ferrous sulfate  325 (65 FE) MG EC tablet Take 325 mg by mouth daily with breakfast. With Vitamin C     glucose blood test strip 1 each by Other route daily. Use as instructed 100 each 5   Lancets (ONETOUCH DELICA PLUS LANCET33G) MISC 1 Act by Does not apply route daily. 100 each 5   levocetirizine (XYZAL ) 5 MG tablet TAKE ONE-HALF (1/2) TABLET (2.5 MG) EVERY OTHER DAY (Patient taking differently: Take 2.5 mg by mouth every other day.) 23 tablet 3   predniSONE  (DELTASONE ) 50 MG tablet Advised to take prednisone  for 3 more days to complete 5-day treatment. 3 tablet 0   trimethoprim  (TRIMPEX ) 100 MG tablet Take 100 mg by mouth daily.     Vitamin D , Ergocalciferol , (DRISDOL ) 1.25 MG (50000 UNIT) CAPS capsule TAKE 1 CAPSULE EVERY 7 DAYS (Patient taking differently: Take 50,000 Units by mouth every 7 (seven) days.) 6 capsule  0   gabapentin  (NEURONTIN ) 300 MG capsule Take 1 capsule (300 mg total) by mouth 3 (three) times daily. 21 capsule 0   No facility-administered medications prior to visit.    ROS Review of Systems  Objective:  BP 124/78 (BP Location: Left Arm, Patient Position: Sitting, Cuff Size: Normal)   Pulse 83   Temp 97.9 F (36.6 C) (Oral)   Resp 16   Ht 5\' 7"  (1.702 m)   Wt 146 lb (66.2 kg)   SpO2 95%   BMI 22.87 kg/m   BP Readings from Last 3 Encounters:  08/12/23 124/78  05/20/23 122/78  05/04/23 (!) 148/75    Wt Readings from Last 3  Encounters:  08/12/23 146 lb (66.2 kg)  05/20/23 152 lb 3.2 oz (69 kg)  05/02/23 150 lb (68 kg)    Physical Exam Vitals reviewed.  Constitutional:      General: She is not in acute distress.    Appearance: She is ill-appearing. She is not toxic-appearing or diaphoretic.  HENT:     Mouth/Throat:     Mouth: Mucous membranes are moist.  Eyes:     General: No scleral icterus.    Conjunctiva/sclera: Conjunctivae normal.  Cardiovascular:     Rate and Rhythm: Normal rate and regular rhythm.     Heart sounds: No murmur heard.    No friction rub. No gallop.  Pulmonary:     Effort: Pulmonary effort is normal.     Breath sounds: No stridor. No wheezing, rhonchi or rales.  Abdominal:     General: Abdomen is flat. Bowel sounds are normal. There is no distension.     Palpations: There is no hepatomegaly, splenomegaly or mass.     Tenderness: There is no abdominal tenderness. There is no guarding.  Musculoskeletal:        General: Normal range of motion.     Cervical back: Neck supple.     Right lower leg: No edema.     Left lower leg: No edema.  Lymphadenopathy:     Cervical: No cervical adenopathy.  Skin:    General: Skin is warm and dry.  Neurological:     Mental Status: She is alert. Mental status is at baseline.  Psychiatric:        Mood and Affect: Mood normal.        Behavior: Behavior normal.     Lab Results  Component Value Date   WBC 10.9 (H) 08/12/2023   HGB 15.6 (H) 08/12/2023   HCT 47.5 (H) 08/12/2023   PLT 193.0 08/12/2023   GLUCOSE 117 (H) 08/12/2023   CHOL 166 05/28/2021   TRIG 192.0 (H) 05/28/2021   HDL 44.10 05/28/2021   LDLCALC 84 05/28/2021   ALT 15 08/12/2023   AST 18 08/12/2023   NA 132 (L) 08/12/2023   K 4.3 08/12/2023   CL 99 08/12/2023   CREATININE 2.64 (H) 08/12/2023   BUN 35 (H) 08/12/2023   CO2 23 08/12/2023   TSH 2.90 05/20/2023   INR 1.2 07/16/2021   HGBA1C 6.5 08/12/2023   MICROALBUR 9.6 (H) 08/12/2023    DG Chest Portable 1  View Result Date: 05/02/2023 CLINICAL DATA:  Shortness of breath EXAM: PORTABLE CHEST 1 VIEW COMPARISON:  Chest radiograph dated 12/19/2022 FINDINGS: Lines/tubes: Left chest wall pacemaker leads project over the right atrium and ventricle. Lungs: Well inflated lungs. Left mid lung linear opacities. Pleura: No pneumothorax or pleural effusion. Heart/mediastinum: Similar  cardiomediastinal silhouette. Bones: No acute osseous abnormality. IMPRESSION: Left  mid lung linear opacities, likely atelectasis/scarring. No focal consolidations. Electronically Signed   By: Limin  Xu M.D.   On: 05/02/2023 10:48   DG ABD ACUTE 2+V W 1V CHEST Result Date: 08/12/2023 CLINICAL DATA:  Bilateral flank pain. EXAM: DG ABDOMEN ACUTE WITH 1 VIEW CHEST COMPARISON:  May 20, 2023. FINDINGS: There is no evidence of dilated bowel loops or free intraperitoneal air. No radiopaque calculi or other significant radiographic abnormality is seen. Heart size and mediastinal contours are within normal limits. Both lungs are clear. IMPRESSION: No abnormal bowel dilatation.  No acute cardiopulmonary disease. Aortic Atherosclerosis (ICD10-I70.0). Electronically Signed   By: Rosalene Colon M.D.   On: 08/12/2023 15:16     Assessment & Plan:  CKD (chronic kidney disease) stage 4, GFR 15-29 ml/min (HCC) -     Basic metabolic panel with GFR; Future -     CBC with Differential/Platelet; Future -     Microalbumin / creatinine urine ratio; Future  Dysuria -     Urinalysis, Routine w reflex microscopic; Future -     CULTURE, URINE COMPREHENSIVE; Future  Bilateral flank pain -     Lipase; Future -     CBC with Differential/Platelet; Future -     Hepatic function panel; Future -     DG ABD ACUTE 2+V W 1V CHEST; Future  Diarrhea of infectious origin -     CBC with Differential/Platelet; Future -     GI Profile, Stool, PCR; Future  Type 2 diabetes mellitus with stage 3b chronic kidney disease, without long-term current use of insulin   (HCC) -     CBC with Differential/Platelet; Future -     Urinalysis, Routine w reflex microscopic; Future -     Hemoglobin A1c; Future  Lumbar radiculopathy -     Gabapentin ; Take 1 capsule (100 mg total) by mouth 3 (three) times daily.  Dispense: 90 capsule; Refill: 0  Diabetic polyneuropathy associated with type 2 diabetes mellitus (HCC) -     Gabapentin ; Take 1 capsule (100 mg total) by mouth 3 (three) times daily.  Dispense: 90 capsule; Refill: 0     Follow-up: No follow-ups on file.  Sandra Crouch, MD

## 2023-08-13 ENCOUNTER — Telehealth: Payer: Self-pay | Admitting: Internal Medicine

## 2023-08-13 NOTE — Telephone Encounter (Unsigned)
 Copied from CRM [id number]. Topic: Clinical - Lab/Test Results >> Aug 13, 2023 10:02 AM Danae Duncans wrote: Reason for CRM: Pt son tamma mandell would like to be contacted to elaborate more on pt lab results- pt son can be reached at [phone].

## 2023-08-14 ENCOUNTER — Telehealth: Payer: Self-pay | Admitting: Internal Medicine

## 2023-08-14 LAB — CULTURE, URINE COMPREHENSIVE

## 2023-08-14 NOTE — Telephone Encounter (Signed)
 Patient states that his mother states that she doesn't want to go through Dialysis. She would like to do any and everything other than that. Maybe medications that can keep her most comfortable for the time being.He says that they will come in the office and discuss this with you face to face if they have to. He also wants to know how much longer she has.

## 2023-08-14 NOTE — Telephone Encounter (Signed)
 Please advise. I sent you a Mychart Message on this yesterday.

## 2023-08-14 NOTE — Telephone Encounter (Signed)
 Copied from CRM [id number]. Topic: Clinical - Medical Advice >> Aug 14, 2023  1:13 PM Chuck Crater wrote: Reason for CRM: Patient son  wants to know what will happen if she does go through the dialysis or what will happen if she doesn't. Patient son Hinda Lucy has questions regarding hospice and her medication. Patient son is requesting a callback before the end of day from Dr. Rochelle Chu.

## 2023-08-15 ENCOUNTER — Ambulatory Visit (INDEPENDENT_AMBULATORY_CARE_PROVIDER_SITE_OTHER): Payer: Medicare Other

## 2023-08-15 DIAGNOSIS — I495 Sick sinus syndrome: Secondary | ICD-10-CM

## 2023-08-15 LAB — CUP PACEART REMOTE DEVICE CHECK
Battery Remaining Longevity: 141 mo
Battery Voltage: 3.04 V
Brady Statistic AP VP Percent: 17.65 %
Brady Statistic AP VS Percent: 74.74 %
Brady Statistic AS VP Percent: 1.45 %
Brady Statistic AS VS Percent: 6.07 %
Brady Statistic RA Percent Paced: 68.65 %
Brady Statistic RV Percent Paced: 39.52 %
Date Time Interrogation Session: 20250515230323
Implantable Lead Connection Status: 753985
Implantable Lead Connection Status: 753985
Implantable Lead Implant Date: 20240214
Implantable Lead Implant Date: 20240214
Implantable Lead Location: 753859
Implantable Lead Location: 753860
Implantable Lead Model: 3830
Implantable Lead Model: 5076
Implantable Pulse Generator Implant Date: 20240214
Lead Channel Impedance Value: 304 Ohm
Lead Channel Impedance Value: 437 Ohm
Lead Channel Impedance Value: 513 Ohm
Lead Channel Impedance Value: 570 Ohm
Lead Channel Pacing Threshold Amplitude: 0.5 V
Lead Channel Pacing Threshold Amplitude: 0.625 V
Lead Channel Pacing Threshold Pulse Width: 0.4 ms
Lead Channel Pacing Threshold Pulse Width: 0.4 ms
Lead Channel Sensing Intrinsic Amplitude: 0.375 mV
Lead Channel Sensing Intrinsic Amplitude: 0.375 mV
Lead Channel Sensing Intrinsic Amplitude: 12.875 mV
Lead Channel Sensing Intrinsic Amplitude: 12.875 mV
Lead Channel Setting Pacing Amplitude: 1.5 V
Lead Channel Setting Pacing Amplitude: 2.5 V
Lead Channel Setting Pacing Pulse Width: 0.4 ms
Lead Channel Setting Sensing Sensitivity: 1.2 mV
Zone Setting Status: 755011

## 2023-08-18 ENCOUNTER — Ambulatory Visit: Payer: Self-pay | Admitting: Internal Medicine

## 2023-08-28 ENCOUNTER — Encounter: Payer: Self-pay | Admitting: Internal Medicine

## 2023-08-28 ENCOUNTER — Ambulatory Visit (INDEPENDENT_AMBULATORY_CARE_PROVIDER_SITE_OTHER): Admitting: Internal Medicine

## 2023-08-28 VITALS — BP 140/76 | HR 64 | Temp 98.0°F | Resp 16 | Ht 67.0 in | Wt 150.2 lb

## 2023-08-28 DIAGNOSIS — I480 Paroxysmal atrial fibrillation: Secondary | ICD-10-CM

## 2023-08-28 DIAGNOSIS — L308 Other specified dermatitis: Secondary | ICD-10-CM

## 2023-08-28 DIAGNOSIS — N184 Chronic kidney disease, stage 4 (severe): Secondary | ICD-10-CM | POA: Diagnosis not present

## 2023-08-28 DIAGNOSIS — I10 Essential (primary) hypertension: Secondary | ICD-10-CM

## 2023-08-28 LAB — CBC WITH DIFFERENTIAL/PLATELET
Basophils Absolute: 0.1 10*3/uL (ref 0.0–0.1)
Basophils Relative: 0.7 % (ref 0.0–3.0)
Eosinophils Absolute: 0.2 10*3/uL (ref 0.0–0.7)
Eosinophils Relative: 2.1 % (ref 0.0–5.0)
HCT: 44 % (ref 36.0–46.0)
Hemoglobin: 14.4 g/dL (ref 12.0–15.0)
Lymphocytes Relative: 16.3 % (ref 12.0–46.0)
Lymphs Abs: 1.3 10*3/uL (ref 0.7–4.0)
MCHC: 32.7 g/dL (ref 30.0–36.0)
MCV: 96.1 fl (ref 78.0–100.0)
Monocytes Absolute: 0.8 10*3/uL (ref 0.1–1.0)
Monocytes Relative: 9.9 % (ref 3.0–12.0)
Neutro Abs: 5.8 10*3/uL (ref 1.4–7.7)
Neutrophils Relative %: 71 % (ref 43.0–77.0)
Platelets: 165 10*3/uL (ref 150.0–400.0)
RBC: 4.58 Mil/uL (ref 3.87–5.11)
RDW: 13.7 % (ref 11.5–15.5)
WBC: 8.2 10*3/uL (ref 4.0–10.5)

## 2023-08-28 LAB — BASIC METABOLIC PANEL WITH GFR
BUN: 20 mg/dL (ref 6–23)
CO2: 29 meq/L (ref 19–32)
Calcium: 9.7 mg/dL (ref 8.4–10.5)
Chloride: 102 meq/L (ref 96–112)
Creatinine, Ser: 1.41 mg/dL — ABNORMAL HIGH (ref 0.40–1.20)
GFR: 32.97 mL/min — ABNORMAL LOW (ref >60.00)
Glucose, Bld: 111 mg/dL — ABNORMAL HIGH (ref 70–99)
Potassium: 4.3 meq/L (ref 3.5–5.1)
Sodium: 137 meq/L (ref 135–145)

## 2023-08-28 LAB — PHOSPHORUS: Phosphorus: 3.2 mg/dL (ref 2.3–4.6)

## 2023-08-28 NOTE — Patient Instructions (Signed)
 Chronic Kidney Disease in Adults: What to Know Chronic kidney disease (CKD) is when lasting damage happens to the kidneys slowly over time. The kidneys are two organs that do many important things in the body. These include: Taking waste and extra fluid out of the blood to make pee (urine). Making hormones. Keeping the right amount of fluids and chemicals in the body. A small amount of kidney damage may not cause problems. You must take steps to help keep the kidney damage from getting worse. A lot of damage may cause kidney failure. Kidney failure means the kidneys can no longer work right. What are the causes? Diabetes. High blood pressure. Diseases that affect the heart and blood vessels. Other kidney diseases. Diseases that affect the body's defense system (immune system). A problem with the flow of pee. This may be caused by: Kidney stones. Cancer. An enlarged prostate, in males. A kidney infection or urinary tract infection (UTI) that keeps coming back. What increases the risk? Getting older. The chances of having CKD increase with age. A family history of kidney disease or kidney failure. Having a disease caused by genes. Taking medicines that can harm the kidneys. Being near or having contact with harmful substances. Being very overweight. Using tobacco now or in the past. What are the signs or symptoms? Common symptoms of CKD include: Feeling very tired and having less energy. Swelling of the face, legs, ankles, or feet. Throwing up or feeling like you may throw up. Not wanting to eat as much as normal. Being confused or not able to focus. Twitches and cramps in the leg muscles or other muscles. Dry, itchy skin. Other symptoms may include: Shortness of breath. Trouble sleeping. Making less pee, or making more pee, especially at night. A taste of metal in your mouth. You may also become anemic. Anemia means there's not enough red blood cells in your blood. You may get  symptoms slowly. You may not notice them until the kidney damage gets very bad. How is this diagnosed? CKD may be diagnosed based on: Tests on your blood or pee. Imaging tests, like an ultrasound or a CT scan. A kidney biopsy. For this test, a sample of kidney tissue is removed to be looked at under a microscope. These tests will help to find out how serious the CKD is. How is this treated? Often, there's no cure for CKD. Treatment can help with symptoms and help keep the disease from getting worse. Treatment may include: Treating other problems that are causing your CKD or making it worse. Diet changes. You may need to: Avoid alcohol. Avoid foods that are high in salt, potassium, phosphorous, and protein. Taking medicines for symptoms and to help control other conditions. Dialysis. This treatment gets harmful waste out of your body. It may be needed if you have kidney failure. Follow these instructions at home: Medicines Take your medicines only as told. The amount of some medicines you take may need to be changed. Do not take any new medicines, vitamins, or supplements unless your health care provider says it's okay. These may make kidney damage worse. Lifestyle Do not smoke, vape, or use nicotine or tobacco. If you drink alcohol: Limit how much you have to: 0-1 drink a day if you're female. 0-2 drinks a day if you're female. Know how much alcohol is in your drink. In the [place] one drink is one 12 oz bottle of beer (355 mL), one 5 oz glass of wine (148 mL), or one 1 oz  glass of hard liquor (44 mL). Stay at a healthy weight. If you need help, ask your provider. General instructions  Eat and drink as told. Track your blood pressure at home. Tell your provider about any changes. If you have diabetes, track your blood sugar as told. Exercise at least 30 minutes a day, 5 days a week. Keep your shots (vaccinations) up to date. Keep all follow-up visits. Your provider may need to change  your treatments over time. Where to find support American Kidney Fund: [place].com.au Kidney School: kidneyschool.org American Association of Kidney Patients: https://www.miller-montoya.com/ Where to find more information National Kidney Foundation: kidney.org Centers for Disease Control and Prevention. To learn more: Go to DiningCalendar.de. Click "Search". Type "chronic kidney disease" in the search box. Contact a health care provider if: You have new symptoms. You get symptoms of end-stage kidney disease. These include: Headaches. Numbness in your hands or feet. Leg cramps. Easy bruising. Get help right away if: You have a fever. You make less pee than usual. You have pain or bleeding when you pee or poop. You have chest pain. You have shortness of breath. These symptoms may be an emergency. Call 911 right away. Do not wait to see if the symptoms will go away. Do not drive yourself to the hospital. This information is not intended to replace advice given to you by your health care provider. Make sure you discuss any questions you have with your health care provider. Document Revised: 01/28/2023 Document Reviewed: 09/20/2022 Elsevier Patient Education  2024 [employer].

## 2023-08-28 NOTE — Progress Notes (Unsigned)
 Subjective:  Patient ID: Penny Miller, female    DOB: 07/01/1931  Age: 89 y.o. MRN: [id number]  CC: Kidneys (Discuss kidneys) and Medication Refill (Eliquis  refill. )   HPI RAVNEET SPILKER presents for f/up ----  Discussed the use of AI scribe software for clinical note transcription with the patient, who gave verbal consent to proceed.  History of Present Illness   Penny Miller is an 89 year old female with chronic kidney disease who presents with concerns about medication side effects and kidney function. She is accompanied by her son, Penny Miller.  She has experienced a decline in kidney function, previously presenting with nausea, weakness, and decreased urine output. These symptoms have improved significantly after discontinuing trimethoprim , and she is now urinating regularly and feels stronger.  She has a history of chronic kidney disease and was previously on gabapentin , which she has reduced from three to one pill per day. Gabapentin  was initially prescribed for neuropathic pain and itching following a stroke six or seven years ago. She reports severe itching, which was initially managed by gabapentin  but has worsened recently.  She reports occasional coughing, which she attributes to dryness in her throat. No new respiratory symptoms or significant breathing difficulties.  She has a history of swelling in her legs and feet, which has resolved. Previously, her feet were swollen and painful, but this has improved.  She mentions a rash located on her heel and shoulder that develops into sores. Despite treatment with a prescribed cream, the rash recurs.  She has a history of urinary issues, including frequent urination, which led to her referral to urology. She was placed on medication to manage urinary frequency but experienced intolerable side effects, leading to discontinuation of all medications except an antibiotic for urinary tract infections.       Outpatient Medications Prior  to Visit  Medication Sig Dispense Refill   acetaminophen  (TYLENOL ) 500 MG tablet Take 500 mg by mouth every 6 (six) hours as needed (pain).     albuterol  (PROVENTIL ) (2.5 MG/3ML) 0.083% nebulizer solution Take 3 mLs (2.5 mg total) by nebulization every 6 (six) hours as needed for wheezing or shortness of breath. 150 mL 3   amLODipine  (NORVASC ) 2.5 MG tablet Take 2.5 mg by mouth at bedtime.     atorvastatin  (LIPITOR) 40 MG tablet TAKE 1 TABLET AT BEDTIME 90 tablet 3   Blood Glucose Monitoring Suppl (ONE TOUCH ULTRA 2) w/Device KIT 1 Act by Does not apply route daily. 1 kit 2   citalopram  (CELEXA ) 10 MG tablet TAKE 1 TABLET DAILY 90 tablet 0   dapagliflozin  propanediol (FARXIGA ) 10 MG TABS tablet TAKE 1 TABLET DAILY BEFORE BREAKFAST 90 tablet 0   famotidine  (PEPCID ) 20 MG tablet TAKE 1 TABLET ONCE TO TWICE A DAY 60 tablet 2   ferrous sulfate  325 (65 FE) MG EC tablet Take 325 mg by mouth daily with breakfast. With Vitamin C     gabapentin  (NEURONTIN ) 100 MG capsule Take 1 capsule (100 mg total) by mouth 3 (three) times daily. 90 capsule 0   glucose blood test strip 1 each by Other route daily. Use as instructed 100 each 5   Lancets (ONETOUCH DELICA PLUS LANCET33G) MISC 1 Act by Does not apply route daily. 100 each 5   Vitamin D , Ergocalciferol , (DRISDOL ) 1.25 MG (50000 UNIT) CAPS capsule TAKE 1 CAPSULE EVERY 7 DAYS (Patient taking differently: Take 50,000 Units by mouth every 7 (seven) days.) 6 capsule 0   apixaban  (ELIQUIS )  2.5 MG TABS tablet Take 1 tablet (2.5 mg total) by mouth 2 (two) times daily. 180 tablet 0   levocetirizine (XYZAL ) 5 MG tablet TAKE ONE-HALF (1/2) TABLET (2.5 MG) EVERY OTHER DAY (Patient taking differently: Take 2.5 mg by mouth every other day.) 23 tablet 3   predniSONE  (DELTASONE ) 50 MG tablet Advised to take prednisone  for 3 more days to complete 5-day treatment. 3 tablet 0   trimethoprim  (TRIMPEX ) 100 MG tablet Take 100 mg by mouth daily.     No facility-administered  medications prior to visit.    ROS Review of Systems  Constitutional: Negative.  Negative for appetite change, chills, diaphoresis and fatigue.  Respiratory:  Positive for cough. Negative for chest tightness, shortness of breath and wheezing.   Cardiovascular:  Negative for chest pain, palpitations and leg swelling.  Gastrointestinal:  Negative for abdominal pain, constipation, diarrhea, nausea and vomiting.  Genitourinary: Negative.   Musculoskeletal:  Positive for arthralgias and gait problem. Negative for myalgias and neck pain.  Skin:  Positive for rash. Negative for color change.  Neurological:  Negative for dizziness and weakness.  Hematological:  Negative for adenopathy. Does not bruise/bleed easily.  Psychiatric/Behavioral:  Positive for confusion and decreased concentration.     Objective:  BP (!) 140/76 (BP Location: Left Arm, Patient Position: Sitting, Cuff Size: Normal)   Pulse 64   Temp 98 F (36.7 C) (Oral)   Resp 16   Ht 5\' 7"  (1.702 m)   Wt 150 lb 3.2 oz (68.1 kg)   SpO2 95%   BMI 23.52 kg/m   BP Readings from Last 3 Encounters:  08/28/23 (!) 140/76  08/12/23 124/78  05/20/23 122/78    Wt Readings from Last 3 Encounters:  08/28/23 150 lb 3.2 oz (68.1 kg)  08/12/23 146 lb (66.2 kg)  05/20/23 152 lb 3.2 oz (69 kg)    Physical Exam Vitals reviewed.  Constitutional:      General: She is not in acute distress.    Appearance: She is not ill-appearing, toxic-appearing or diaphoretic.  HENT:     Mouth/Throat:     Mouth: Mucous membranes are moist.  Eyes:     General: No scleral icterus.    Conjunctiva/sclera: Conjunctivae normal.  Cardiovascular:     Rate and Rhythm: Normal rate and regular rhythm.     Heart sounds: No murmur heard.    No friction rub. No gallop.  Pulmonary:     Effort: Pulmonary effort is normal.     Breath sounds: No stridor. No wheezing, rhonchi or rales.  Abdominal:     General: Abdomen is flat.     Palpations: There is no  mass.     Tenderness: There is no abdominal tenderness. There is no guarding.     Hernia: No hernia is present.  Musculoskeletal:        General: Normal range of motion.     Cervical back: Neck supple.     Right lower leg: No edema.     Left lower leg: No edema.  Lymphadenopathy:     Cervical: No cervical adenopathy.  Skin:    General: Skin is warm and dry.  Neurological:     General: No focal deficit present.     Mental Status: She is alert. Mental status is at baseline.  Psychiatric:        Mood and Affect: Mood normal.        Behavior: Behavior normal.     Lab Results  Component Value Date  WBC 8.2 08/28/2023   HGB 14.4 08/28/2023   HCT 44.0 08/28/2023   PLT 165.0 08/28/2023   GLUCOSE 111 (H) 08/28/2023   CHOL 166 05/28/2021   TRIG 192.0 (H) 05/28/2021   HDL 44.10 05/28/2021   LDLCALC 84 05/28/2021   ALT 15 08/12/2023   AST 18 08/12/2023   NA 137 08/28/2023   K 4.3 08/28/2023   CL 102 08/28/2023   CREATININE 1.41 (H) 08/28/2023   BUN 20 08/28/2023   CO2 29 08/28/2023   TSH 2.90 05/20/2023   INR 1.2 07/16/2021   HGBA1C 6.5 08/12/2023   MICROALBUR 9.6 (H) 08/12/2023    DG Chest Portable 1 View Result Date: 05/02/2023 CLINICAL DATA:  Shortness of breath EXAM: PORTABLE CHEST 1 VIEW COMPARISON:  Chest radiograph dated 12/19/2022 FINDINGS: Lines/tubes: Left chest wall pacemaker leads project over the right atrium and ventricle. Lungs: Well inflated lungs. Left mid lung linear opacities. Pleura: No pneumothorax or pleural effusion. Heart/mediastinum: Similar  cardiomediastinal silhouette. Bones: No acute osseous abnormality. IMPRESSION: Left mid lung linear opacities, likely atelectasis/scarring. No focal consolidations. Electronically Signed   By: Limin  Xu M.D.   On: 05/02/2023 10:48    Assessment & Plan:  CKD (chronic kidney disease) stage 4, GFR 15-29 ml/min (HCC) -     Phosphorus; Future -     Cystatin C with Glomerular Filtration Rate, Estimated (eGFR);  Future -     CBC with Differential/Platelet; Future -     Basic metabolic panel with GFR; Future -     Ambulatory referral to Hospice  Essential hypertension- BP is well controlled. -     CBC with Differential/Platelet; Future -     Basic metabolic panel with GFR; Future  Pruritic dermatitis -     hydrOXYzine  HCl; Take 1 tablet (10 mg total) by mouth every 8 (eight) hours as needed.  Dispense: 270 tablet; Refill: 0  Paroxysmal atrial fibrillation (HCC)- She has good R/R control. -     Apixaban ; Take 1 tablet (2.5 mg total) by mouth 2 (two) times daily.  Dispense: 180 tablet; Refill: 0     Follow-up: Return in about 3 months (around 11/28/2023).  Sandra Crouch, MD

## 2023-08-29 ENCOUNTER — Telehealth: Payer: Self-pay | Admitting: Podiatry

## 2023-08-29 ENCOUNTER — Telehealth: Payer: Self-pay | Admitting: Internal Medicine

## 2023-08-29 ENCOUNTER — Ambulatory Visit: Payer: Self-pay | Admitting: Internal Medicine

## 2023-08-29 DIAGNOSIS — L308 Other specified dermatitis: Secondary | ICD-10-CM | POA: Insufficient documentation

## 2023-08-29 MED ORDER — HYDROXYZINE HCL 10 MG PO TABS: 10.0000 mg | ORAL_TABLET | Freq: Three times a day (TID) | ORAL | 0 refills | Status: DC | PRN

## 2023-08-29 MED ORDER — APIXABAN 2.5 MG PO TABS
2.5000 mg | ORAL_TABLET | Freq: Two times a day (BID) | ORAL | 0 refills | Status: DC
Start: 2023-08-29 — End: 2023-10-28

## 2023-08-29 NOTE — Telephone Encounter (Signed)
 Patients son and daughter in law has been made aware of the medication sent in for her itching. They both gave verbal understandings.

## 2023-08-29 NOTE — Telephone Encounter (Signed)
 Copied from CRM [id number]. Topic: Clinical - Medication Question >> Aug 29, 2023  8:49 AM Adonis Hoot wrote: Reason for CRM: Patient was seen in office yesterday 08/28/2023. Provider told her that he would send something into pharmacy for her itching. Patiens daughter would like to know if anything was sent in?   [id number] - [place], [place] - [address]  Phone: [phone] Fax: [phone]  ---  Appears 2 RX's were sent in today

## 2023-08-29 NOTE — Telephone Encounter (Signed)
 Patient requesting a call from provider. Patient hasn't had an appointment for [facility] since December 2024 due to illness. She called in and made an appointment for 11/05/2023 back in April. Patient called back this am wanting a message passed along to see if she could be worked in at an earlier date. Patient states that L1 and R1 need to be trimmed as the are starting to grow into her skin. I did look back and there wasn't anything else available sooner as of yet, so I relayed this information and she got upset. I have added the appointment to the wait list.

## 2023-09-02 LAB — CYSTATIN C WITH GLOMERULAR FILTRATION RATE, ESTIMATED (EGFR)
CYSTATIN C: 2.63 mg/L — ABNORMAL HIGH (ref 0.52–1.07)
eGFR: 18 mL/min/{1.73_m2} — ABNORMAL LOW (ref >=60)

## 2023-09-10 ENCOUNTER — Ambulatory Visit: Admitting: Internal Medicine

## 2023-09-17 ENCOUNTER — Encounter: Payer: Self-pay | Admitting: Podiatry

## 2023-09-17 ENCOUNTER — Telehealth: Payer: Self-pay | Admitting: Podiatry

## 2023-09-17 ENCOUNTER — Ambulatory Visit (INDEPENDENT_AMBULATORY_CARE_PROVIDER_SITE_OTHER): Admitting: Podiatry

## 2023-09-17 DIAGNOSIS — B351 Tinea unguium: Secondary | ICD-10-CM | POA: Diagnosis not present

## 2023-09-17 DIAGNOSIS — M79675 Pain in left toe(s): Secondary | ICD-10-CM | POA: Diagnosis not present

## 2023-09-17 DIAGNOSIS — M79674 Pain in right toe(s): Secondary | ICD-10-CM

## 2023-09-17 DIAGNOSIS — E1142 Type 2 diabetes mellitus with diabetic polyneuropathy: Secondary | ICD-10-CM

## 2023-09-17 NOTE — Telephone Encounter (Signed)
 This patient was insistent on speaking with you.  She ask me to put a phone message in for you to call her.  She has to be worked in.  She is down for ingrown nails in August, so I am confused.  Stated she has to see you before then.

## 2023-09-18 NOTE — Progress Notes (Signed)
 Remote pacemaker transmission.

## 2023-09-23 ENCOUNTER — Encounter: Payer: Self-pay | Admitting: Podiatry

## 2023-09-23 NOTE — Progress Notes (Signed)
 ANNUAL DIABETIC FOOT EXAM  Subjective: Penny Miller presents today for annual diabetic foot exam. Chief Complaint  Patient presents with   [facility]    Rm17 DFC/diabetic/bloodsugar 120/dr. Joshua Last Visit may 2025   Patient confirms h/o diabetes.  Patient has been diagnosed with neuropathy.  Penny Penny CROME, MD is patient's PCP.  Past Medical History:  Diagnosis Date   Allergic rhinitis, cause unspecified    Anxiety state, unspecified    Arthritis    Colon polyp    Diaphragmatic hernia without mention of obstruction or gangrene    Diverticulosis of colon (without mention of hemorrhage)    Female stress incontinence    HTN (hypertension)    Insomnia, unspecified    Interstitial cystitis    surgery 02/01/2013   Irritable bowel syndrome    Memory loss    Other and unspecified hyperlipidemia    Other premature beats    Pacemaker 2024   Polyneuropathy in diabetes(357.2)    Postmenopausal bleeding    Proteinuria    Rectal ulcer    Skin cancer    Stroke (HCC) 2018   Type II or unspecified type diabetes mellitus with neurological manifestations, not stated as uncontrolled(250.60)    Unspecified essential hypertension    Viral warts, unspecified    Patient Active Problem List   Diagnosis Date Noted   Pruritic dermatitis 08/29/2023   Urinary incontinence 01/14/2023   CKD (chronic kidney disease) stage 4, GFR 15-29 ml/min (HCC) 12/30/2022   Pacemaker 08/22/2022   B12 deficiency 04/04/2022   Asthma, chronic, unspecified asthma severity, with acute exacerbation 03/12/2022   Anemia due to acquired thiamine  deficiency 01/30/2022   Anemia due to zinc  deficiency 01/30/2022   Sinus node dysfunction (HCC) 01/17/2022   PVC (premature ventricular contraction) 01/17/2022   Moderate persistent asthma without complication 09/25/2021   Constipation 07/25/2021   Pulmonary nodule 07/25/2021   Compression fracture of T12 vertebra (HCC) 07/23/2021   OAB (overactive bladder) 12/26/2020    Chronic cerebral ischemia 11/20/2020   Frequent falls 11/10/2020   Seasonal allergic rhinitis due to pollen 02/03/2020   Intrinsic eczema 02/03/2020   Incomplete emptying of bladder 10/01/2019   Acquired hypothyroidism 08/15/2019   Iron deficiency anemia due to chronic blood loss 08/15/2019   Spinal stenosis, lumbar region with neurogenic claudication 05/26/2018   Vertigo of central origin 07/21/2017   Overweight (BMI 25.0-29.9) 11/07/2016   Paroxysmal atrial fibrillation (HCC) 09/03/2016   Diabetic polyneuropathy associated with type 2 diabetes mellitus (HCC) 09/03/2016   Aneurysm of vertebral artery (HCC) 08/12/2016   DM (diabetes mellitus), type 2 with renal complications (HCC) 06/22/2014   Bladder infection, chronic 01/04/2013   Insomnia, unspecified    Essential hypertension    Chronic interstitial cystitis    Past Surgical History:  Procedure Laterality Date   ABDOMINAL HYSTERECTOMY  1977   Dr.Hambright   APPENDECTOMY  1965   CATARACT EXTRACTION, BILATERAL  02/10/2006   Dr.Groat    COLONOSCOPY  07/17/2004   polypectomy   CYSTOSCOPY  1987   Dr.Bradley   LOOP RECORDER INSERTION N/A 07/31/2016   Procedure: Loop Recorder Insertion;  Surgeon: Danelle LELON Birmingham, MD;  Location: [facility]VASIVE [facility];  Service: Cardiovascular;  Laterality: N/A;   LOOP RECORDER REMOVAL N/A 05/15/2022   Procedure: LOOP RECORDER REMOVAL;  Surgeon: Birmingham Danelle LELON, MD;  Location: [facility] INVASIVE CV [facility];  Service: Cardiovascular;  Laterality: N/A;   PACEMAKER IMPLANT N/A 05/15/2022   Procedure: PACEMAKER IMPLANT;  Surgeon: Birmingham Danelle LELON, MD;  Location: [facility]AS[facility];  Service: Cardiovascular;  Laterality: N/A;   TEE WITHOUT CARDIOVERSION N/A 07/30/2016   Procedure: TRANSESOPHAGEAL ECHOCARDIOGRAM (TEE);  Surgeon: Maude JAYSON Emmer, MD;  Location: [facility] ENDOSCOPY;  Service: Cardiovascular;  Laterality: N/A;   TRIGGER FINGER RELEASE  1990   Dr.Carter   TRIGGER FINGER RELEASE  05/13/16   urinary bladder      surgery  02/01/2013 to stretch bladder stem   Current Outpatient Medications on File Prior to Visit  Medication Sig Dispense Refill   acetaminophen  (TYLENOL ) 500 MG tablet Take 500 mg by mouth every 6 (six) hours as needed (pain).     albuterol  (PROVENTIL ) (2.5 MG/3ML) 0.083% nebulizer solution Take 3 mLs (2.5 mg total) by nebulization every 6 (six) hours as needed for wheezing or shortness of breath. 150 mL 3   amLODipine  (NORVASC ) 2.5 MG tablet Take 2.5 mg by mouth at bedtime.     apixaban  (ELIQUIS ) 2.5 MG TABS tablet Take 1 tablet (2.5 mg total) by mouth 2 (two) times daily. 180 tablet 0   atorvastatin  (LIPITOR) 40 MG tablet TAKE 1 TABLET AT BEDTIME 90 tablet 3   Blood Glucose Monitoring Suppl (ONE TOUCH ULTRA 2) w/Device KIT 1 Act by Does not apply route daily. 1 kit 2   citalopram  (CELEXA ) 10 MG tablet TAKE 1 TABLET DAILY 90 tablet 0   dapagliflozin  propanediol (FARXIGA ) 10 MG TABS tablet TAKE 1 TABLET DAILY BEFORE BREAKFAST 90 tablet 0   famotidine  (PEPCID ) 20 MG tablet TAKE 1 TABLET ONCE TO TWICE A DAY 60 tablet 2   ferrous sulfate  325 (65 FE) MG EC tablet Take 325 mg by mouth daily with breakfast. With Vitamin C     gabapentin  (NEURONTIN ) 100 MG capsule Take 1 capsule (100 mg total) by mouth 3 (three) times daily. 90 capsule 0   glucose blood test strip 1 each by Other route daily. Use as instructed 100 each 5   hydrOXYzine  (ATARAX ) 10 MG tablet Take 1 tablet (10 mg total) by mouth every 8 (eight) hours as needed. 270 tablet 0   Lancets (ONETOUCH DELICA PLUS LANCET33G) MISC 1 Act by Does not apply route daily. 100 each 5   Vitamin D , Ergocalciferol , (DRISDOL ) 1.25 MG (50000 UNIT) CAPS capsule TAKE 1 CAPSULE EVERY 7 DAYS (Patient taking differently: Take 50,000 Units by mouth every 7 (seven) days.) 6 capsule 0   No current facility-administered medications on file prior to visit.    Allergies  Allergen Reactions   Adhesive [Tape] Rash and Other (See Comments)    Tears the skin also   Ceclor  [Cefaclor] Other (See Comments)    Unknown reaction; not recalled   Codeine Nausea And Vomiting   Jardiance  [Empagliflozin ] Diarrhea   Lopid [Gemfibrozil] Other (See Comments)    Unknown reaction; not recalled   Metformin  And Related Diarrhea   Metoprolol Nausea And Vomiting   Morphine And Codeine Other (See Comments)    Passed out   Niacin And Related Other (See Comments)    Unknown reaction   Relafen [Nabumetone] Nausea And Vomiting   Septra  [Sulfamethoxazole -Trimethoprim ] Nausea And Vomiting   Tetracyclines & Related Nausea And Vomiting   Social History   Occupational History   Occupation: Runner, broadcasting/film/video retired    Comment: Engineer, agricultural   Tobacco Use   Smoking status: Some Days    Current packs/day: 0.00    Average packs/day: 0.3 packs/day for 62.0 years (15.5 ttl pk-yrs)    Types: Cigarettes    Start date: 04/01/1949  Last attempt to quit: 04/02/2011    Years since quitting: 12.4   Smokeless tobacco: Never   Tobacco comments:    stopped consistently smoking in 2012 but still smokes occasionally (one pack per week)  Vaping Use   Vaping status: Never Used  Substance and Sexual Activity   Alcohol use: No   Drug use: No   Sexual activity: Not Currently   Family History  Problem Relation Age of Onset   Pancreatic cancer Mother 48   Heart attack Father    Bladder Cancer Father    Hypertension Sister    Diabetes Sister    COPD Sister    Hypertension Son    Diabetes Brother    Heart disease Brother    Hypertension Brother    Diabetes Brother    Heart disease Brother 12       Myocardial Infarction    Heart attack Brother    Immunization History  Administered Date(s) Administered   Fluad Quad(high Dose 65+) 02/03/2020, 12/26/2020, 01/23/2022   Fluad Trivalent(High Dose 65+) 12/30/2022   Influenza, High Dose Seasonal PF 12/31/2016, 12/15/2017, 01/25/2019   Influenza,inj,Quad PF,6+ Mos 12/15/2012, 12/21/2013, 02/28/2015, 02/02/2016   Influenza-Unspecified 01/22/2012    PFIZER(Purple Top)SARS-COV-2 Vaccination 05/14/2019, 06/08/2019   Pneumococcal Conjugate-13 05/07/2016   Pneumococcal Polysaccharide-23 11/03/2019   Pneumococcal-Unspecified 01/24/1995   Td 09/14/2008   Tdap 01/25/2020   Zoster Recombinant(Shingrix) 12/23/2019, 06/13/2020     Review of Systems: Negative except as noted in the HPI.   Objective: There were no vitals filed for this visit.  Penny Miller is a pleasant 89 y.o. female in NAD. AAO X 3.  Diabetic foot exam was performed with the following findings:   Vascular Examination: CFT <3 seconds b/l. DP/PT pulses faintly palpable b/l. Skin temperature gradient warm to warm b/l. No pain with calf compression. No ischemia or gangrene. No cyanosis or clubbing noted b/l.    Neurological Examination: Pt has subjective symptoms of neuropathy. Protective sensation intact 5/5 intact bilaterally with 10g monofilament b/l.  Dermatological Examination: Pedal skin warm and supple b/l.   No open wounds. No interdigital macerations.  Toenails 1-5 b/l thick, discolored, elongated with subungual debris and pain on dorsal palpation.    No corns, calluses nor porokeratotic lesions noted.  Musculoskeletal Examination: Muscle strength 5/5 to all lower extremity muscle groups bilaterally. Pes cavus deformity noted bilateral LE.  Radiographs: None     Lab Results  Component Value Date   HGBA1C 6.5 08/12/2023   ADA Risk Categorization: Low Risk :  Patient has all of the following: Intact protective sensation No prior foot ulcer  No severe deformity Pedal pulses present  Assessment: 1. Pain due to onychomycosis of toenails of both feet   2. Diabetic peripheral neuropathy (HCC)     Plan: Patient was evaluated and treated. All patient's and/or POA's questions/concerns addressed on today's visit. Mycotic toenails 1-5 debrided in length and girth without incident. Continue soft, supportive shoe gear daily. Report any pedal injuries to  medical professional. Call office if there are any quesitons/concerns. -Patient/POA to call should there be question/concern in the interim. Return in about 3 months (around 12/18/2023).  Delon LITTIE Merlin, DPM      [facility] LOCATION: [address]  [place], [place] [address]                   Office [phone]   [facility] LOCATION: [address] [place], [place] [address] Office [phone]

## 2023-10-24 ENCOUNTER — Other Ambulatory Visit: Payer: Self-pay | Admitting: Internal Medicine

## 2023-10-24 DIAGNOSIS — J301 Allergic rhinitis due to pollen: Secondary | ICD-10-CM

## 2023-10-27 ENCOUNTER — Ambulatory Visit: Payer: Self-pay

## 2023-10-27 ENCOUNTER — Telehealth: Payer: Self-pay | Admitting: Internal Medicine

## 2023-10-27 NOTE — Telephone Encounter (Signed)
 Spoke to patient/son together. States redness and itching is not at pacemaker. States he misunderstood his mother. States the are of itching is under her left arm for x1 week. Denies any redness/itching at pacemaker site.  Recommend following up with PCP for issue. Advised he will contact them for further advisement.  Was appreciative of call back.

## 2023-10-27 NOTE — Telephone Encounter (Signed)
 FYI Only or Action Required?: Action required by provider: request for appointment.  Patient was last seen in primary care on 08/28/2023 by Joshua Debby CROME, MD.  Called Nurse Triage reporting Rash.  Symptoms began several years ago.  Interventions attempted: OTC medications: hydrocortisone cream.  Symptoms are: gradually worsening.  Triage Disposition: See Physician Within 24 Hours  Patient/caregiver understands and will follow disposition?: YesCopied from CRM #[id number]. Topic: Clinical - Red Word Triage >> Oct 27, 2023  3:13 PM Frederich PARAS wrote: [facility] that prompted transfer to Nurse Triage: pain   PT and son calling in  because pt is breaking out allover, itching very badly , breaking out near her pacemaker. Pt says the breakout is painful on the back Reason for Disposition  SEVERE itching (i.e., interferes with sleep, normal activities or school)  Answer Assessment - Initial Assessment Questions 1. APPEARANCE of RASH: What does the rash look like? (e.g., blisters, dry flaky skin, red spots, redness, sores)     Red, bumpy, blisters 2. SIZE: How big are the spots? (e.g., tip of pen, eraser, coin; inches, centimeters)     varies 3. LOCATION: Where is the rash located?     Back, under arms, legs 4. COLOR: What color is the rash? (Note: It is difficult to assess rash color in people with darker-colored skin. When this situation occurs, simply ask the caller to describe what they see.)     red 5. ONSET: When did the rash begin?     Several years 6. FEVER: Do you have a fever? If Yes, ask: What is your temperature, how was it measured, and when did it start?     denies 7. ITCHING: Does the rash itch? If Yes, ask: How bad is the itch? (Scale 1-10; or mild, moderate, severe)     10 8. CAUSE: What do you think is causing the rash?     Stage 3 kidney failure 9. MEDICINE FACTORS: Have you started any new medicines within the last 2 weeks? (e.g., antibiotics)       denies 10. OTHER SYMPTOMS: Do you have any other symptoms? (e.g., dizziness, headache, sore throat, joint pain)       Tiredness, SOB, nausea  Son Sherida called with worsening widespread rash from stage 3 kidney failure. Back is the worst where the rash has caused sores due to itching. Pt has tried hydrocortisone cream with no relief.  Protocols used: Rash or Redness - [facility]

## 2023-10-27 NOTE — Telephone Encounter (Signed)
 Patient's son is calling because the patient is starting to experience some itchiness and redness around where the device is located. Patient is scheduled for a 57yr f/u on 11/05/23. Please advise.

## 2023-10-28 ENCOUNTER — Encounter: Payer: Self-pay | Admitting: Family Medicine

## 2023-10-28 ENCOUNTER — Ambulatory Visit (INDEPENDENT_AMBULATORY_CARE_PROVIDER_SITE_OTHER): Admitting: Family Medicine

## 2023-10-28 VITALS — BP 140/78 | HR 70 | Temp 97.9°F | Ht 67.0 in | Wt 146.6 lb

## 2023-10-28 DIAGNOSIS — E785 Hyperlipidemia, unspecified: Secondary | ICD-10-CM

## 2023-10-28 DIAGNOSIS — D6859 Other primary thrombophilia: Secondary | ICD-10-CM | POA: Diagnosis not present

## 2023-10-28 DIAGNOSIS — E1122 Type 2 diabetes mellitus with diabetic chronic kidney disease: Secondary | ICD-10-CM | POA: Diagnosis not present

## 2023-10-28 DIAGNOSIS — Z7984 Long term (current) use of oral hypoglycemic drugs: Secondary | ICD-10-CM

## 2023-10-28 DIAGNOSIS — N1832 Chronic kidney disease, stage 3b: Secondary | ICD-10-CM | POA: Diagnosis not present

## 2023-10-28 DIAGNOSIS — F411 Generalized anxiety disorder: Secondary | ICD-10-CM | POA: Diagnosis not present

## 2023-10-28 DIAGNOSIS — E559 Vitamin D deficiency, unspecified: Secondary | ICD-10-CM | POA: Insufficient documentation

## 2023-10-28 DIAGNOSIS — Z79899 Other long term (current) drug therapy: Secondary | ICD-10-CM | POA: Diagnosis not present

## 2023-10-28 DIAGNOSIS — K219 Gastro-esophageal reflux disease without esophagitis: Secondary | ICD-10-CM | POA: Diagnosis not present

## 2023-10-28 DIAGNOSIS — R21 Rash and other nonspecific skin eruption: Secondary | ICD-10-CM | POA: Insufficient documentation

## 2023-10-28 DIAGNOSIS — R300 Dysuria: Secondary | ICD-10-CM

## 2023-10-28 DIAGNOSIS — I480 Paroxysmal atrial fibrillation: Secondary | ICD-10-CM | POA: Diagnosis not present

## 2023-10-28 LAB — COMPREHENSIVE METABOLIC PANEL WITH GFR
ALT: 11 U/L (ref 0–35)
AST: 15 U/L (ref 0–37)
Albumin: 4.1 g/dL (ref 3.5–5.2)
Alkaline Phosphatase: 74 U/L (ref 39–117)
BUN: 19 mg/dL (ref 6–23)
CO2: 28 meq/L (ref 19–32)
Calcium: 10 mg/dL (ref 8.4–10.5)
Chloride: 102 meq/L (ref 96–112)
Creatinine, Ser: 1.5 mg/dL — ABNORMAL HIGH (ref 0.40–1.20)
GFR: 30.58 mL/min — ABNORMAL LOW (ref >60.00)
Glucose, Bld: 127 mg/dL — ABNORMAL HIGH (ref 70–99)
Potassium: 4.5 meq/L (ref 3.5–5.1)
Sodium: 138 meq/L (ref 135–145)
Total Bilirubin: 0.6 mg/dL (ref 0.2–1.2)
Total Protein: 6.6 g/dL (ref 6.0–8.3)

## 2023-10-28 LAB — MICROALBUMIN / CREATININE URINE RATIO
Creatinine,U: 87.8 mg/dL
Microalb Creat Ratio: 110.9 mg/g — ABNORMAL HIGH (ref 0.0–30.0)
Microalb, Ur: 9.7 mg/dL — ABNORMAL HIGH (ref 0.0–1.9)

## 2023-10-28 LAB — LIPID PANEL
Cholesterol: 148 mg/dL (ref 0–200)
HDL: 48 mg/dL (ref >39.00)
LDL Cholesterol: 67 mg/dL (ref 0–99)
NonHDL: 100.16
Total CHOL/HDL Ratio: 3
Triglycerides: 164 mg/dL — ABNORMAL HIGH (ref 0.0–149.0)
VLDL: 32.8 mg/dL (ref 0.0–40.0)

## 2023-10-28 LAB — TSH: TSH: 2.43 u[IU]/mL (ref 0.35–5.50)

## 2023-10-28 LAB — VITAMIN D 25 HYDROXY (VIT D DEFICIENCY, FRACTURES): VITD: 79.79 ng/mL (ref 30.00–100.00)

## 2023-10-28 LAB — VITAMIN B12: Vitamin B-12: 126 pg/mL — ABNORMAL LOW (ref 211–911)

## 2023-10-28 MED ORDER — ATORVASTATIN CALCIUM 40 MG PO TABS: 40.0000 mg | ORAL_TABLET | Freq: Every day | ORAL | 1 refills | Status: DC

## 2023-10-28 MED ORDER — TRIAMCINOLONE ACETONIDE 0.5 % EX OINT: 1.0000 | TOPICAL_OINTMENT | Freq: Two times a day (BID) | CUTANEOUS | 0 refills | Status: AC

## 2023-10-28 MED ORDER — AMLODIPINE BESYLATE 2.5 MG PO TABS: 2.5000 mg | ORAL_TABLET | Freq: Every day | ORAL | 1 refills | Status: DC

## 2023-10-28 MED ORDER — APIXABAN 2.5 MG PO TABS: 2.5000 mg | ORAL_TABLET | Freq: Two times a day (BID) | ORAL | 1 refills | Status: AC

## 2023-10-28 MED ORDER — FAMOTIDINE 20 MG PO TABS: ORAL_TABLET | ORAL | 2 refills | Status: AC

## 2023-10-28 MED ORDER — CITALOPRAM HYDROBROMIDE 20 MG PO TABS: 20.0000 mg | ORAL_TABLET | Freq: Every day | ORAL | 1 refills | Status: DC

## 2023-10-28 NOTE — Patient Instructions (Addendum)
 Recommend Udderly Smooth 20% Urea  I have sent in triamcinolone  cream for you.  You may use this twice a day.  Medications refilled today.  We are checking labs today, will be in contact with any results that require further attention  Follow up in 3 mos with Dr Joshua

## 2023-10-28 NOTE — Progress Notes (Signed)
 Acute Office Visit  Subjective:     Patient ID: Penny Miller, female    DOB: 1931/10/19, 89 y.o.   MRN: [id number]  No chief complaint on file.   HPI  Discussed the use of AI scribe software for clinical note transcription with the patient, who gave verbal consent to proceed.  History of Present Illness [name] is a 89 year old female with chronic kidney disease who presents with a persistent itchy rash.  Pruritic vesiculobullous rash - Persistent itchy rash characterized by blisters that peel off, sometimes leaving scars - Primarily located on the legs and back - Itchiness is prominent; rash sometimes resolves spontaneously - Cortisone and other topical creams provide only temporary relief - Bathing with water offers momentary relief - Benadryl causes dryness - Hydroxyzine  is ineffective  Chronic kidney disease - Chronic kidney disease worsened to stage four during a medication crisis two months ago - Renal function has improved to the lower end of stage three - Cautious with medications that may cause dehydration - Current medications include Farxiga , amlodipine , atorvastatin , citalopram , and famotidine   Anxiety and sleep disturbance - Experiences tension and anxiety, particularly after her sister's death - Questions the effectiveness of her current citalopram  dose - Uses Tylenol  at night to aid sleep  Dysuria - Experiences burning during urination - Monitors urine, which appears clear     ROS Per HPI      Objective:    BP (!) 140/78 (BP Location: Left Arm, Patient Position: Sitting)   Pulse 70   Temp 97.9 F (36.6 C) (Temporal)   Ht 5' 7 (1.702 m)   Wt 146 lb 9.6 oz (66.5 kg)   SpO2 95%   BMI 22.96 kg/m    Physical Exam Vitals and nursing note reviewed.  Constitutional:      General: She is not in acute distress.    Appearance: She is normal weight.     Comments: elderly  HENT:     Head: Normocephalic and atraumatic.     Right Ear:  External ear normal.     Left Ear: External ear normal.     Nose: Nose normal.     Mouth/Throat:     Mouth: Mucous membranes are moist.     Pharynx: Oropharynx is clear.  Eyes:     Extraocular Movements: Extraocular movements intact.     Pupils: Pupils are equal, round, and reactive to light.  Cardiovascular:     Rate and Rhythm: Normal rate and regular rhythm.     Pulses: Normal pulses.     Heart sounds: Normal heart sounds.  Pulmonary:     Effort: Pulmonary effort is normal. No respiratory distress.     Breath sounds: Normal breath sounds. No wheezing, rhonchi or rales.  Musculoskeletal:        General: Normal range of motion.     Cervical back: Normal range of motion.     Right lower leg: No edema.     Left lower leg: No edema.  Lymphadenopathy:     Cervical: No cervical adenopathy.  Skin:    General: Skin is warm and dry.     Findings: Rash present.         Comments: Areas of erythema, evidence of excoriation with scabbing. No bleeding, no discharge   Neurological:     General: No focal deficit present.     Mental Status: She is alert and oriented to person, place, and time.  Psychiatric:  Mood and Affect: Mood normal.        Thought Content: Thought content normal.     No results found for any visits on 10/28/23.      Assessment & Plan:   Assessment and Plan Assessment & Plan Pruritic rash with chronic skin changes Chronic pruritic rash with blistering and peeling, likely exacerbated by chronic kidney disease. Previous treatments provided limited relief. Hydroxyzine  ineffective. Concerns about Benadryl causing dehydration and affecting kidney function. - Prescribe stronger steroid ointment and send to [facility] [address]. - Recommend urea-based cream available on [employer] for moisturizing and itch relief. - Advise against using Benadryl due to potential dehydration and kidney impact.  Chronic kidney disease stage 3 Chronic kidney disease well-managed at the  lower end of stage 3. Recent improvement noted, but monitoring is necessary. - Order blood and urine tests to assess current kidney function.  Recurrent urinary tract infections Recurrent UTIs with current burning sensation. Unclear if symptoms are due to dryness or another UTI. Urine appears clear, further investigation warranted. - Send urine for culture to check for infection.  Type 2 diabetes mellitus Type 2 diabetes managed with dapagliflozin  (Farxiga ). - Ensure dapagliflozin  (Farxiga ) prescription is up to date.  GAD GAD managed with citalopram . Current dose is low, reports increased tension and ineffective symptom control. Recent bereavement may contribute to symptoms. - Increase citalopram  dosage and change administration to nighttime to potentially improve sleep and symptom control.  General Health Maintenance Discussion of medication refills and ensuring adequate supply of necessary medications. - Refill medications including amlodipine , atorvastatin , citalopram , and famotidine .     Orders Placed This Encounter  Procedures   Urine Culture    Standing Status:   Future    Number of Occurrences:   1    Expiration Date:   11/28/2023   CBC with Differential/Platelet    Release to patient:   Immediate [1]   Comprehensive metabolic panel with GFR    Release to patient:   Immediate [1]   Lipid panel   Microalbumin / creatinine urine ratio    Release to patient:   Immediate   TSH   VITAMIN D  25 Hydroxy (Vit-D Deficiency, Fractures)   Vitamin B12     Meds ordered this encounter  Medications   triamcinolone  ointment (KENALOG ) 0.5 %    Sig: Apply 1 Application topically 2 (two) times daily.    Dispense:  30 g    Refill:  0   citalopram  (CELEXA ) 20 MG tablet    Sig: Take 1 tablet (20 mg total) by mouth at bedtime.    Dispense:  90 tablet    Refill:  1   amLODipine  (NORVASC ) 2.5 MG tablet    Sig: Take 1 tablet (2.5 mg total) by mouth at bedtime.    Dispense:  90 tablet     Refill:  1   apixaban  (ELIQUIS ) 2.5 MG TABS tablet    Sig: Take 1 tablet (2.5 mg total) by mouth 2 (two) times daily.    Dispense:  180 tablet    Refill:  1    90 day supply   atorvastatin  (LIPITOR) 40 MG tablet    Sig: Take 1 tablet (40 mg total) by mouth at bedtime.    Dispense:  90 tablet    Refill:  1   famotidine  (PEPCID ) 20 MG tablet    Sig: TAKE 1 TABLET ONCE TO TWICE A DAY    Dispense:  60 tablet    Refill:  2  Return in about 3 months (around 01/28/2024) for Dr Joshua.  Corean LITTIE Ku, FNP

## 2023-10-29 ENCOUNTER — Other Ambulatory Visit

## 2023-10-29 DIAGNOSIS — A09 Infectious gastroenteritis and colitis, unspecified: Secondary | ICD-10-CM

## 2023-10-29 LAB — CBC WITH DIFFERENTIAL/PLATELET
Basophils Absolute: 0.1 K/uL (ref 0.0–0.1)
Basophils Relative: 0.8 % (ref 0.0–3.0)
Eosinophils Absolute: 0.2 K/uL (ref 0.0–0.7)
Eosinophils Relative: 4 % (ref 0.0–5.0)
HCT: 42.6 % (ref 36.0–46.0)
Hemoglobin: 14 g/dL (ref 12.0–15.0)
Lymphocytes Relative: 17.5 % (ref 12.0–46.0)
Lymphs Abs: 1.1 K/uL (ref 0.7–4.0)
MCHC: 32.9 g/dL (ref 30.0–36.0)
MCV: 96 fl (ref 78.0–100.0)
Monocytes Absolute: 0.7 K/uL (ref 0.1–1.0)
Monocytes Relative: 11.3 % (ref 3.0–12.0)
Neutro Abs: 4 K/uL (ref 1.4–7.7)
Neutrophils Relative %: 66.4 % (ref 43.0–77.0)
Platelets: 155 K/uL (ref 150.0–400.0)
RBC: 4.44 Mil/uL (ref 3.87–5.11)
RDW: 13.9 % (ref 11.5–15.5)
WBC: 6.1 K/uL (ref 4.0–10.5)

## 2023-10-30 ENCOUNTER — Ambulatory Visit: Payer: Self-pay | Admitting: Family Medicine

## 2023-10-31 LAB — GI PROFILE, STOOL, PCR
Adenovirus F 40/41: NOT DETECTED
Astrovirus: DETECTED — AB
C difficile toxin A/B: NOT DETECTED
Campylobacter: NOT DETECTED
Cryptosporidium: NOT DETECTED
Cyclospora cayetanensis: NOT DETECTED
Entamoeba histolytica: NOT DETECTED
Enteroaggregative E coli: NOT DETECTED
Enteropathogenic E coli: NOT DETECTED
Enterotoxigenic E coli: NOT DETECTED
Giardia lamblia: NOT DETECTED
Norovirus GI/GII: NOT DETECTED
Plesiomonas shigelloides: NOT DETECTED
Rotavirus A: NOT DETECTED
Salmonella: NOT DETECTED
Sapovirus: NOT DETECTED
Shiga-toxin-producing E coli: NOT DETECTED
Shigella/Enteroinvasive E coli: NOT DETECTED
Vibrio cholerae: NOT DETECTED
Vibrio: NOT DETECTED
Yersinia enterocolitica: NOT DETECTED

## 2023-11-01 LAB — URINE CULTURE

## 2023-11-03 ENCOUNTER — Encounter (HOSPITAL_BASED_OUTPATIENT_CLINIC_OR_DEPARTMENT_OTHER): Payer: Self-pay

## 2023-11-05 ENCOUNTER — Ambulatory Visit: Admitting: Podiatry

## 2023-11-05 ENCOUNTER — Encounter: Payer: Self-pay | Admitting: Internal Medicine

## 2023-11-05 ENCOUNTER — Ambulatory Visit: Attending: Internal Medicine | Admitting: Internal Medicine

## 2023-11-05 VITALS — BP 158/68 | HR 72 | Ht 67.0 in | Wt 150.2 lb

## 2023-11-05 DIAGNOSIS — I495 Sick sinus syndrome: Secondary | ICD-10-CM | POA: Insufficient documentation

## 2023-11-05 LAB — CUP PACEART INCLINIC DEVICE CHECK
Battery Remaining Longevity: 139 mo
Battery Voltage: 3.03 V
Brady Statistic AP VP Percent: 20.89 %
Brady Statistic AP VS Percent: 65.37 %
Brady Statistic AS VP Percent: 5.3 %
Brady Statistic AS VS Percent: 8.37 %
Brady Statistic RA Percent Paced: 65.26 %
Brady Statistic RV Percent Paced: 43.51 %
Date Time Interrogation Session: 20250806123214
Implantable Lead Connection Status: 753985
Implantable Lead Connection Status: 753985
Implantable Lead Implant Date: 20240214
Implantable Lead Implant Date: 20240214
Implantable Lead Location: 753859
Implantable Lead Location: 753860
Implantable Lead Model: 3830
Implantable Lead Model: 5076
Implantable Pulse Generator Implant Date: 20240214
Lead Channel Impedance Value: 323 Ohm
Lead Channel Impedance Value: 437 Ohm
Lead Channel Impedance Value: 570 Ohm
Lead Channel Impedance Value: 589 Ohm
Lead Channel Pacing Threshold Amplitude: 0.5 V
Lead Channel Pacing Threshold Amplitude: 0.625 V
Lead Channel Pacing Threshold Pulse Width: 0.4 ms
Lead Channel Pacing Threshold Pulse Width: 0.4 ms
Lead Channel Sensing Intrinsic Amplitude: 1.125 mV
Lead Channel Sensing Intrinsic Amplitude: 2.125 mV
Lead Channel Sensing Intrinsic Amplitude: 6.875 mV
Lead Channel Sensing Intrinsic Amplitude: 9.25 mV
Lead Channel Setting Pacing Amplitude: 1.5 V
Lead Channel Setting Pacing Amplitude: 2.5 V
Lead Channel Setting Pacing Pulse Width: 0.4 ms
Lead Channel Setting Sensing Sensitivity: 1.2 mV
Zone Setting Status: 755011

## 2023-11-05 NOTE — Patient Instructions (Signed)
 Medication Instructions:  Your physician recommends that you continue on your current medications as directed. Please refer to the Current Medication list given to you today.  *If you need a refill on your cardiac medications before your next appointment, please call your pharmacy*  Lab Work: None ordered.  You may go to any [facility] Location for your lab work:  [facility] - [address] ([facility]) - [address] [phone] [address]  [place] - [address]   [place]  - [address]   [place] - [address] - [address] [employer]  - [address] - [address] ([facility]   If you have labs (blood work) drawn today and your tests are completely normal, you will receive your results only by: [employer] (if you have MyChart)  If you have any lab test that is abnormal or we need to change your treatment, we will call you or send a MyChart message to review the results.  Testing/Procedures: None ordered.  Follow-Up: At [facility], you and your health needs are our priority.  As part of our continuing mission to provide you with exceptional heart care, we have created designated Provider Care Teams.  These Care Teams include your primary Cardiologist (physician) and Advanced Practice Providers (APPs -  Physician Assistants and Nurse Practitioners) who all work together to provide you with the care you need, when you need it.  Your next appointment:   1 year(s)  The format for your next appointment:   In Person  Provider:   Donnice Primus, MD or one of the following Advanced Practice Providers on your designated Care Team:   Penny Miller, [place] Penny Miller, [place] Penny Barrack, NP  Note: Remote monitoring is used to monitor your Pacemaker/ ICD from home. This monitoring reduces the number of office visits required to check  your device to one time per year. It allows us  to keep an eye on the functioning of your device to ensure it is working properly.

## 2023-11-05 NOTE — Progress Notes (Signed)
 HPI Penny Miller returns today for followup. She has a h/o cryptogenic stroke and was found to have PAF. I saw her a couple of years ago and she had experienced unexplained bradycardia. She wore a cardiac zio monitor demonstrating daytime episodes of bradycardia when she is in atrial fib which she is about 5% of the time. She is s/p PPM insertion. She has done well in the interim. She denies chest pain. She has had UTI's treated with anti-biotics. She developed worsening renal dysfunction on bactrim  which she was taking chronically to prevent UTI's . Her renal function improved.  Allergies  Allergen Reactions   Adhesive [Tape] Rash and Other (See Comments)    Tears the skin also   Ceclor [Cefaclor] Other (See Comments)    Unknown reaction; not recalled   Codeine Nausea And Vomiting   Jardiance  [Empagliflozin ] Diarrhea   Lopid [Gemfibrozil] Other (See Comments)    Unknown reaction; not recalled   Metformin  And Related Diarrhea   Metoprolol Nausea And Vomiting   Morphine And Codeine Other (See Comments)    Passed out   Niacin And Related Other (See Comments)    Unknown reaction   Relafen [Nabumetone] Nausea And Vomiting   Septra  [Sulfamethoxazole -Trimethoprim ] Nausea And Vomiting   Tetracyclines & Related Nausea And Vomiting     Current Outpatient Medications  Medication Sig Dispense Refill   acetaminophen  (TYLENOL ) 500 MG tablet Take 500 mg by mouth every 6 (six) hours as needed (pain).     albuterol  (PROVENTIL ) (2.5 MG/3ML) 0.083% nebulizer solution Take 3 mLs (2.5 mg total) by nebulization every 6 (six) hours as needed for wheezing or shortness of breath. 150 mL 3   amLODipine  (NORVASC ) 2.5 MG tablet Take 1 tablet (2.5 mg total) by mouth at bedtime. 90 tablet 1   apixaban  (ELIQUIS ) 2.5 MG TABS tablet Take 1 tablet (2.5 mg total) by mouth 2 (two) times daily. 180 tablet 1   atorvastatin  (LIPITOR) 40 MG tablet Take 1 tablet (40 mg total) by mouth at bedtime. 90 tablet 1   Blood  Glucose Monitoring Suppl (ONE TOUCH ULTRA 2) w/Device KIT 1 Act by Does not apply route daily. 1 kit 2   citalopram  (CELEXA ) 20 MG tablet Take 1 tablet (20 mg total) by mouth at bedtime. 90 tablet 1   dapagliflozin  propanediol (FARXIGA ) 10 MG TABS tablet TAKE 1 TABLET DAILY BEFORE BREAKFAST 90 tablet 0   famotidine  (PEPCID ) 20 MG tablet TAKE 1 TABLET ONCE TO TWICE A DAY 60 tablet 2   gabapentin  (NEURONTIN ) 100 MG capsule Take 1 capsule (100 mg total) by mouth 3 (three) times daily. 90 capsule 0   glucose blood test strip 1 each by Other route daily. Use as instructed 100 each 5   Lancets (ONETOUCH DELICA PLUS LANCET33G) MISC 1 Act by Does not apply route daily. 100 each 5   triamcinolone  ointment (KENALOG ) 0.5 % Apply 1 Application topically 2 (two) times daily. 30 g 0   Vitamin D , Ergocalciferol , (DRISDOL ) 1.25 MG (50000 UNIT) CAPS capsule TAKE 1 CAPSULE EVERY 7 DAYS (Patient taking differently: Take 50,000 Units by mouth every 7 (seven) days.) 6 capsule 0   ferrous sulfate  325 (65 FE) MG EC tablet Take 325 mg by mouth daily with breakfast. With Vitamin C (Patient not taking: Reported on 11/05/2023)     No current facility-administered medications for this visit.     Past Medical History:  Diagnosis Date   Allergic rhinitis, cause unspecified  Anxiety state, unspecified    Arthritis    Colon polyp    Diaphragmatic hernia without mention of obstruction or gangrene    Diverticulosis of colon (without mention of hemorrhage)    Female stress incontinence    HTN (hypertension)    Insomnia, unspecified    Interstitial cystitis    surgery 02/01/2013   Irritable bowel syndrome    Memory loss    Other and unspecified hyperlipidemia    Other premature beats    Pacemaker 2024   Polyneuropathy in diabetes(357.2)    Postmenopausal bleeding    Proteinuria    Rectal ulcer    Skin cancer    Stroke (HCC) 2018   Type II or unspecified type diabetes mellitus with neurological manifestations, not  stated as uncontrolled(250.60)    Unspecified essential hypertension    Viral warts, unspecified     ROS:   All systems reviewed and negative except as noted in the HPI.   Past Surgical History:  Procedure Laterality Date   ABDOMINAL HYSTERECTOMY  1977   Dr.Hambright   APPENDECTOMY  1965   CATARACT EXTRACTION, BILATERAL  02/10/2006   Dr.Groat    COLONOSCOPY  07/17/2004   polypectomy   CYSTOSCOPY  1987   Dr.Bradley   LOOP RECORDER INSERTION N/A 07/31/2016   Procedure: Loop Recorder Insertion;  Surgeon: Danelle LELON Birmingham, MD;  Location: [facility];  Service: Cardiovascular;  Laterality: N/A;   LOOP RECORDER REMOVAL N/A 05/15/2022   Procedure: LOOP RECORDER REMOVAL;  Surgeon: Birmingham Danelle LELON, MD;  Location: [facility];  Service: Cardiovascular;  Laterality: N/A;   PACEMAKER IMPLANT N/A 05/15/2022   Procedure: PACEMAKER IMPLANT;  Surgeon: Birmingham Danelle LELON, MD;  Location: [facility];  Service: Cardiovascular;  Laterality: N/A;   TEE WITHOUT CARDIOVERSION N/A 07/30/2016   Procedure: TRANSESOPHAGEAL ECHOCARDIOGRAM (TEE);  Surgeon: Maude JAYSON Emmer, MD;  Location: [facility] ENDOSCOPY;  Service: Cardiovascular;  Laterality: N/A;   TRIGGER FINGER RELEASE  1990   Dr.Carter   TRIGGER FINGER RELEASE  05/13/16   urinary bladder      surgery 02/01/2013 to stretch bladder stem     Family History  Problem Relation Age of Onset   Pancreatic cancer Mother 48   Heart attack Father    Bladder Cancer Father    Hypertension Sister    Diabetes Sister    COPD Sister    Hypertension Son    Diabetes Brother    Heart disease Brother    Hypertension Brother    Diabetes Brother    Heart disease Brother 19       Myocardial Infarction    Heart attack Brother      Social History   Socioeconomic History   Marital status: Widowed    Spouse name: Not on file   Number of children: 1   Years of education: Not on file   Highest education level: Not on file  Occupational History    Occupation: Teacher retired    Comment: Engineer, agricultural   Tobacco Use   Smoking status: Some Days    Current packs/day: 0.00    Average packs/day: 0.3 packs/day for 62.0 years (15.5 ttl pk-yrs)    Types: Cigarettes    Start date: 04/01/1949    Last attempt to quit: 04/02/2011    Years since quitting: 12.6   Smokeless tobacco: Never   Tobacco comments:    stopped consistently smoking in 2012 but still smokes occasionally (one pack per week)  Vaping  Use   Vaping status: Never Used  Substance and Sexual Activity   Alcohol use: No   Drug use: No   Sexual activity: Not Currently  Other Topics Concern   Not on file  Social History Narrative   Ophthalmologist-Dr.Gould and Dr.Groat   Podiatrist- Dr.Tuchman   Dermatologist- Dr.Lupton   Urologist- Dr. Renay Moats    Lives with her son.    Social Drivers of Corporate investment banker Strain: Low Risk  (12/17/2022)   Overall Financial Resource Strain (CARDIA)    Difficulty of Paying Living Expenses: Not hard at all  Food Insecurity: No Food Insecurity (05/02/2023)   Hunger Vital Sign    Worried About Running Out of Food in the Last Year: Never true    Ran Out of Food in the Last Year: Never true  Transportation Needs: No Transportation Needs (05/02/2023)   PRAPARE - Administrator, Civil Service (Medical): No    Lack of Transportation (Non-Medical): No  Physical Activity: Inactive (12/17/2022)   Exercise Vital Sign    Days of Exercise per Week: 0 days    Minutes of Exercise per Session: 0 min  Stress: No Stress Concern Present (12/17/2022)   [employer] of Occupational Health - Occupational Stress Questionnaire    Feeling of Stress : Not at all  Social Connections: Moderately Isolated (05/02/2023)   Social Connection and Isolation Panel    Frequency of Communication with Friends and Family: More than three times a week    Frequency of Social Gatherings with Friends and Family: Three times a week    Attends Religious  Services: More than 4 times per year    Active Member of Clubs or Organizations: No    Attends Banker Meetings: Never    Marital Status: Widowed  Intimate Partner Violence: Not At Risk (05/02/2023)   Humiliation, Afraid, Rape, and Kick questionnaire    Fear of Current or Ex-Partner: No    Emotionally Abused: No    Physically Abused: No    Sexually Abused: No     BP (!) 158/68   Pulse 72   Ht 5' 7 (1.702 m)   Wt 150 lb 3.2 oz (68.1 kg)   SpO2 96%   BMI 23.52 kg/m   Physical Exam:  Well appearing elderly woman, NAD HEENT: Unremarkable Neck:  No JVD, no thyromegally Lymphatics:  No adenopathy Back:  No CVA tenderness Lungs:  Clear with no wheezes HEART:  Regular rate rhythm, no murmurs, no rubs, no clicks Abd:  soft, positive bowel sounds, no organomegally, no rebound, no guarding Ext:  2 plus pulses, no edema, no cyanosis, no clubbing Skin:  No rashes no nodules Neuro:  CN II through XII intact, motor grossly intact  DEVICE  Normal device function.  See PaceArt for details.   Assess/Plan:  PAF - she is now minimally symptomatic. No change in meds for now. Symptomatic bradycardia - see above. Interestingly this is occurring when she is in atrial fib.  Chronic anti-coagulation - she will continue her blood thinners. Dyspnea - this is much improved.   Danelle Traveon Louro,MD

## 2023-11-07 ENCOUNTER — Telehealth: Payer: Self-pay

## 2023-11-07 NOTE — Telephone Encounter (Signed)
 Copied from CRM #[id number]. Topic: Clinical - Medical Advice >> Nov 07, 2023  1:42 PM Lavanda D wrote: Reason for CRM: Patient's daughter Shary Lamos is calling regarding Ms. Sant' low b12 per recent lab results. She said she was advised to have her take a vitamin b12 supplement but is unsure of the dosage. She would like a call back to confirm the dosage, she said her b12 128 which is way below range.

## 2023-11-07 NOTE — Telephone Encounter (Signed)
**Note De-identified  Woolbright Obfuscation** Please advise 

## 2023-11-07 NOTE — Telephone Encounter (Signed)
 Unable to reach the patients daughter [facility]

## 2023-11-12 NOTE — Telephone Encounter (Signed)
 Patients son and daughter has been made aware. They gave a verbal understanding.

## 2023-11-13 ENCOUNTER — Ambulatory Visit

## 2023-11-14 ENCOUNTER — Ambulatory Visit (INDEPENDENT_AMBULATORY_CARE_PROVIDER_SITE_OTHER): Payer: Medicare Other

## 2023-11-14 DIAGNOSIS — I495 Sick sinus syndrome: Secondary | ICD-10-CM | POA: Diagnosis not present

## 2023-11-14 LAB — CUP PACEART REMOTE DEVICE CHECK
Battery Remaining Longevity: 136 mo
Battery Voltage: 3.03 V
Brady Statistic AP VP Percent: 37.01 %
Brady Statistic AP VS Percent: 43.36 %
Brady Statistic AS VP Percent: 14.07 %
Brady Statistic AS VS Percent: 5.48 %
Brady Statistic RA Percent Paced: 58.99 %
Brady Statistic RV Percent Paced: 63.73 %
Date Time Interrogation Session: 20250814050358
Implantable Lead Connection Status: 753985
Implantable Lead Connection Status: 753985
Implantable Lead Implant Date: 20240214
Implantable Lead Implant Date: 20240214
Implantable Lead Location: 753859
Implantable Lead Location: 753860
Implantable Lead Model: 3830
Implantable Lead Model: 5076
Implantable Pulse Generator Implant Date: 20240214
Lead Channel Impedance Value: 304 Ohm
Lead Channel Impedance Value: 418 Ohm
Lead Channel Impedance Value: 437 Ohm
Lead Channel Impedance Value: 570 Ohm
Lead Channel Pacing Threshold Amplitude: 0.5 V
Lead Channel Pacing Threshold Amplitude: 0.625 V
Lead Channel Pacing Threshold Pulse Width: 0.4 ms
Lead Channel Pacing Threshold Pulse Width: 0.4 ms
Lead Channel Sensing Intrinsic Amplitude: 0.75 mV
Lead Channel Sensing Intrinsic Amplitude: 0.75 mV
Lead Channel Sensing Intrinsic Amplitude: 8.875 mV
Lead Channel Sensing Intrinsic Amplitude: 8.875 mV
Lead Channel Setting Pacing Amplitude: 1.5 V
Lead Channel Setting Pacing Amplitude: 2.5 V
Lead Channel Setting Pacing Pulse Width: 0.4 ms
Lead Channel Setting Sensing Sensitivity: 1.2 mV
Zone Setting Status: 755011

## 2023-11-16 ENCOUNTER — Ambulatory Visit: Payer: Self-pay | Admitting: Internal Medicine

## 2023-11-17 LAB — CUP PACEART REMOTE DEVICE CHECK
Battery Remaining Longevity: 136 mo
Battery Voltage: 3.03 V
Brady Statistic AP VP Percent: 27 %
Brady Statistic AP VS Percent: 66.73 %
Brady Statistic AS VP Percent: 0.48 %
Brady Statistic AS VS Percent: 5.79 %
Brady Statistic RA Percent Paced: 93.23 %
Brady Statistic RV Percent Paced: 27.48 %
Date Time Interrogation Session: 20250815050203
Implantable Lead Connection Status: 753985
Implantable Lead Connection Status: 753985
Implantable Lead Implant Date: 20240214
Implantable Lead Implant Date: 20240214
Implantable Lead Location: 753859
Implantable Lead Location: 753860
Implantable Lead Model: 3830
Implantable Lead Model: 5076
Implantable Pulse Generator Implant Date: 20240214
Lead Channel Impedance Value: 304 Ohm
Lead Channel Impedance Value: 418 Ohm
Lead Channel Impedance Value: 437 Ohm
Lead Channel Impedance Value: 551 Ohm
Lead Channel Pacing Threshold Amplitude: 0.5 V
Lead Channel Pacing Threshold Amplitude: 0.625 V
Lead Channel Pacing Threshold Pulse Width: 0.4 ms
Lead Channel Pacing Threshold Pulse Width: 0.4 ms
Lead Channel Sensing Intrinsic Amplitude: 2.5 mV
Lead Channel Sensing Intrinsic Amplitude: 2.5 mV
Lead Channel Sensing Intrinsic Amplitude: 8.5 mV
Lead Channel Sensing Intrinsic Amplitude: 8.5 mV
Lead Channel Setting Pacing Amplitude: 1.5 V
Lead Channel Setting Pacing Amplitude: 2.5 V
Lead Channel Setting Pacing Pulse Width: 0.4 ms
Lead Channel Setting Sensing Sensitivity: 1.2 mV
Zone Setting Status: 755011

## 2023-11-18 ENCOUNTER — Ambulatory Visit: Payer: Self-pay | Admitting: Internal Medicine

## 2023-12-04 ENCOUNTER — Other Ambulatory Visit: Payer: Self-pay | Admitting: Internal Medicine

## 2023-12-04 DIAGNOSIS — E559 Vitamin D deficiency, unspecified: Secondary | ICD-10-CM

## 2023-12-04 DIAGNOSIS — F325 Major depressive disorder, single episode, in full remission: Secondary | ICD-10-CM

## 2023-12-10 IMAGING — DX DG KNEE AP/LAT W/ SUNRISE*R*
3 series · 3 of 3 positions shown · non-contrast
Comparison: None.

CLINICAL DATA: Bilateral knee pain after a fall 2 weeks ago.

EXAM:
RIGHT KNEE 3 VIEWS; LEFT KNEE 3 VIEWS

[knee ap]
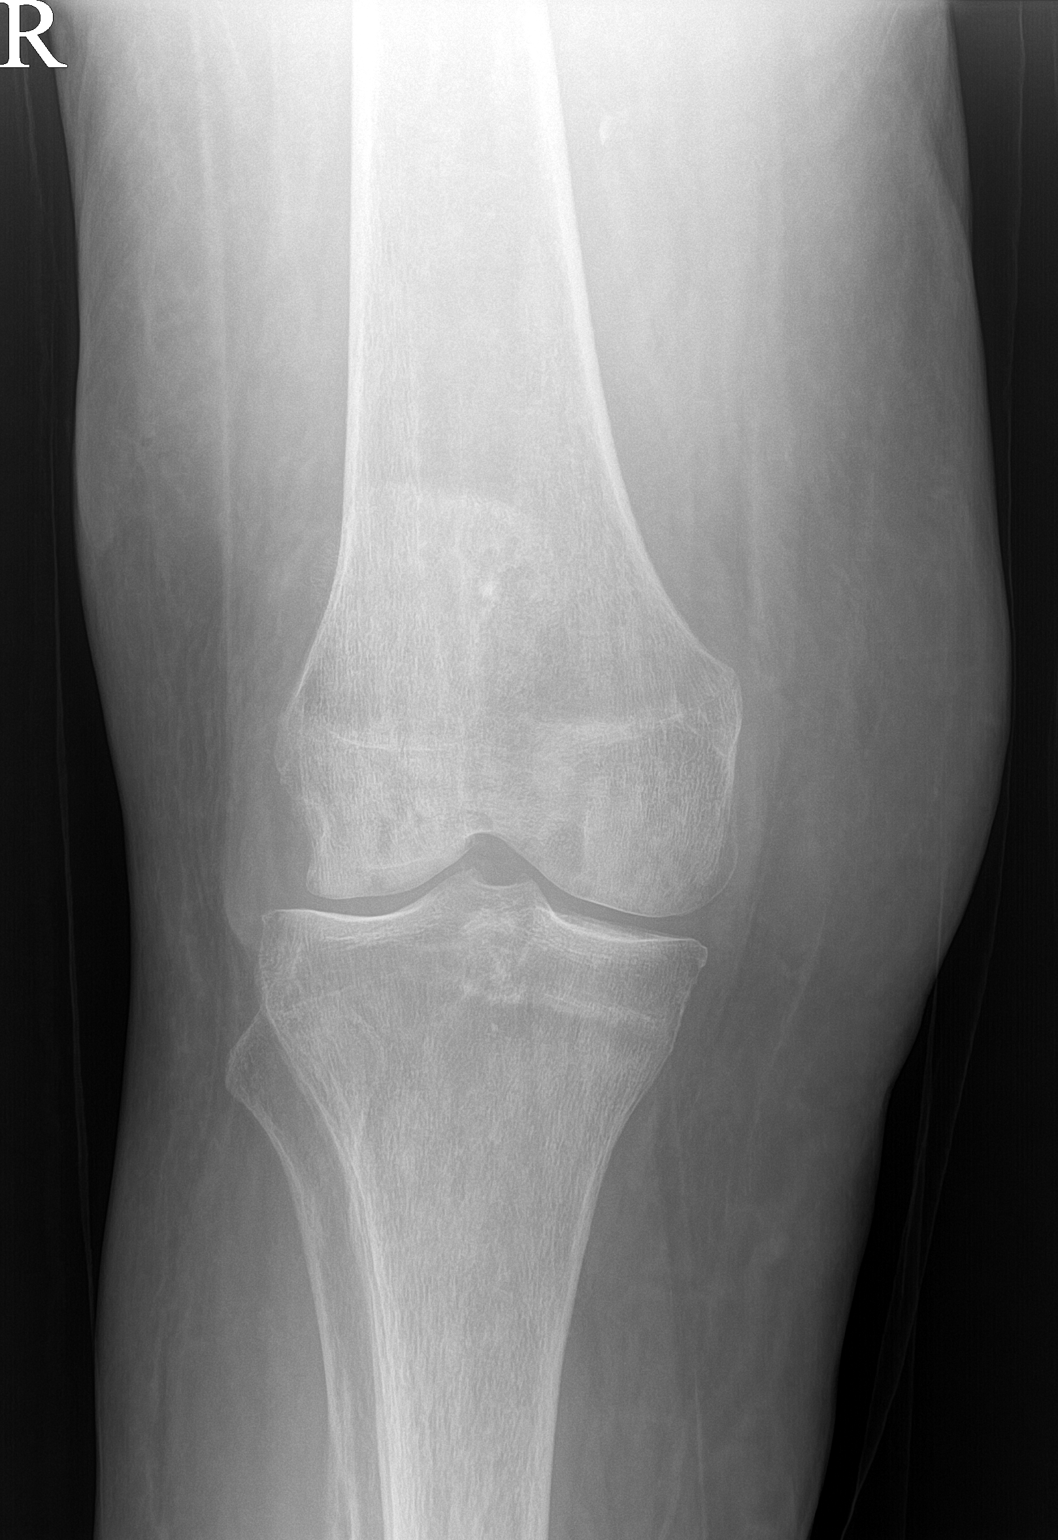

[knee lat]
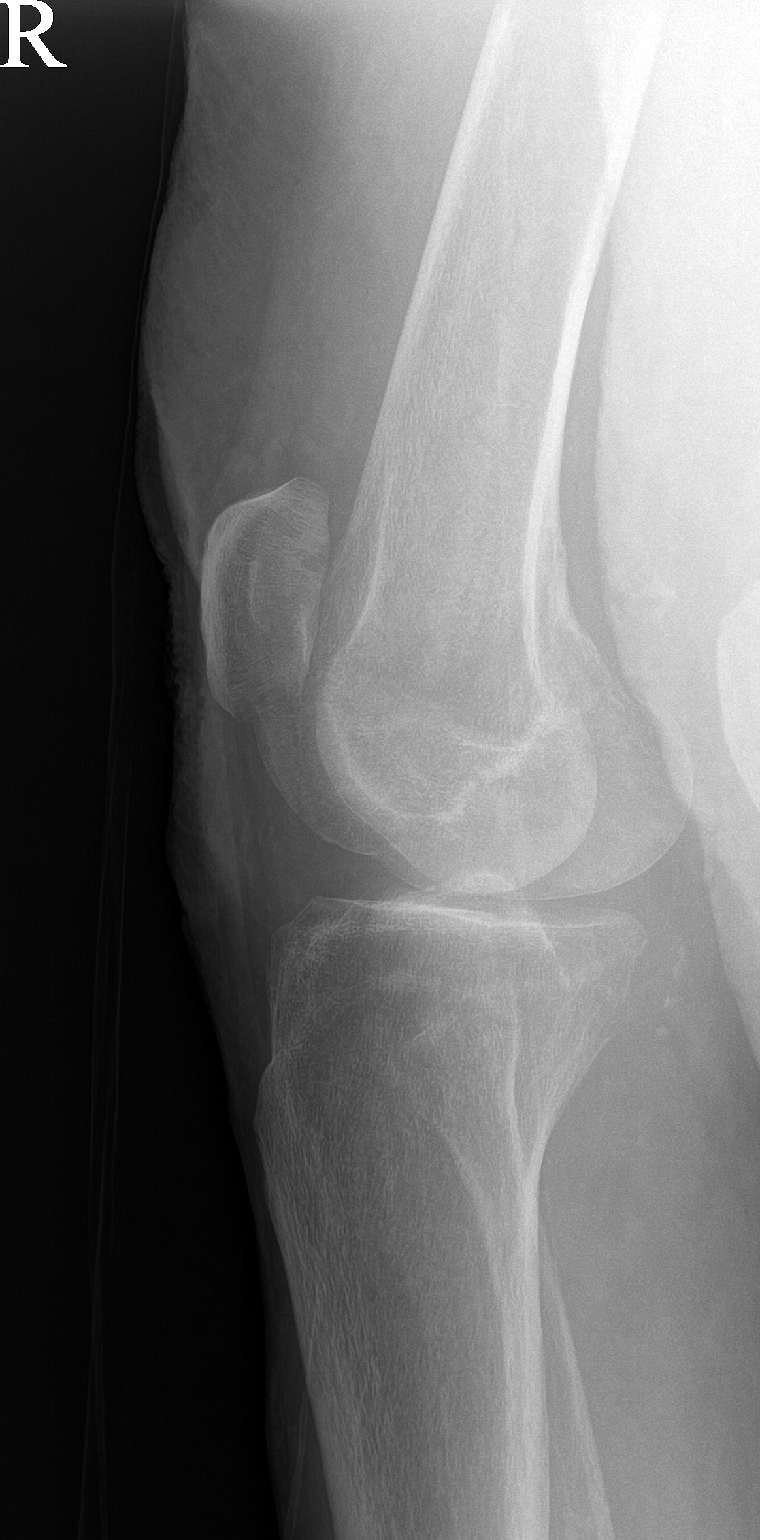

[patella]
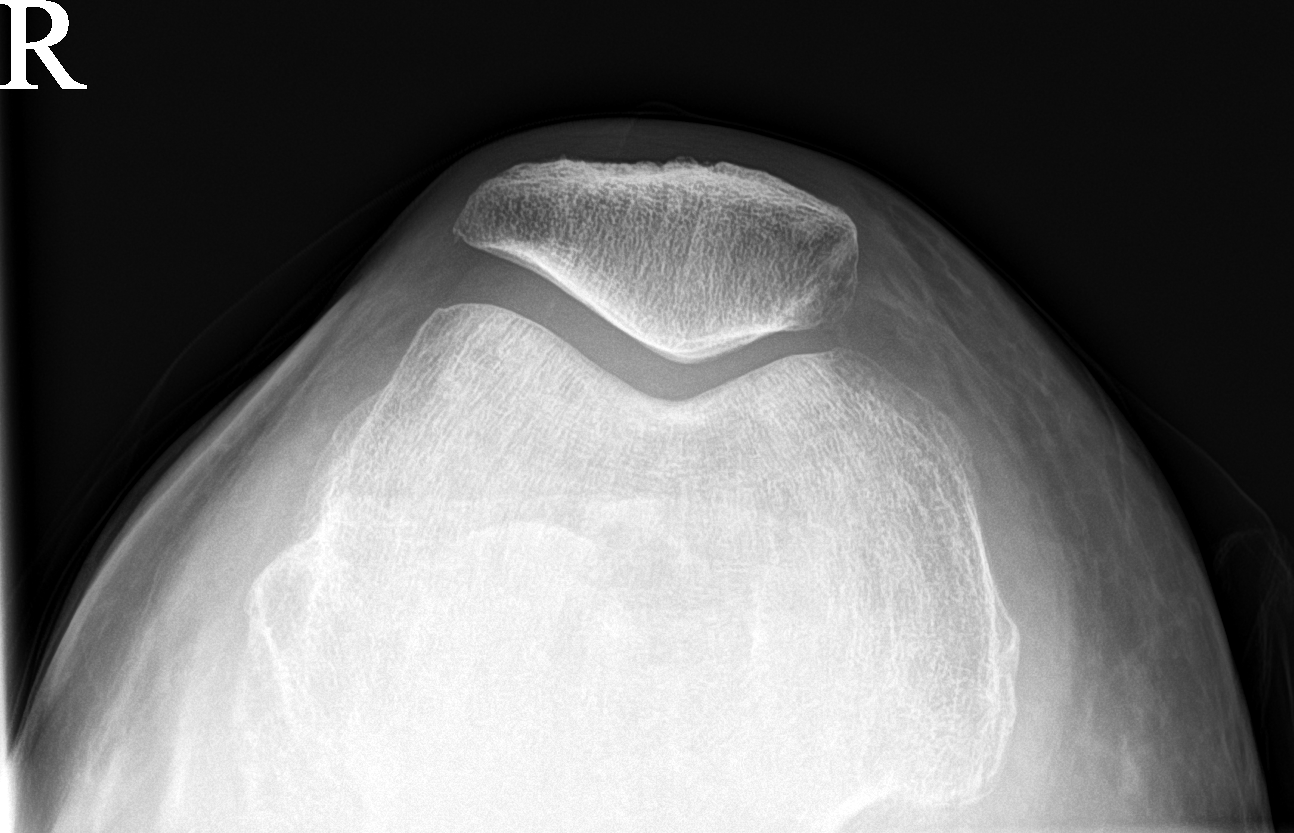

[3 of 3 positions shown; findings below may reference images not displayed]

FINDINGS: Right knee:

Mild-to-moderate lateral compartment joint space narrowing.
Mild-to-moderate patella alta. Mild patellofemoral joint space
narrowing. No joint effusion. No acute fracture or dislocation. Mild
vascular calcifications.

Left knee:

Mild-to-moderate lateral compartment joint space narrowing.
Mild-to-moderate patella alta. No joint effusion. No acute fracture
or dislocation. Mild-to-moderate vascular calcifications.
IMPRESSION: :
IMPRESSION: 1. Mild-to-moderate bilateral patella alta.
2. Bilateral mild-to-moderate lateral compartment osteoarthritis.

## 2023-12-10 IMAGING — DX DG KNEE AP/LAT W/ SUNRISE*L*
3 series · 3 of 3 positions shown · non-contrast
Comparison: None.

CLINICAL DATA: Bilateral knee pain after a fall 2 weeks ago.

EXAM:
RIGHT KNEE 3 VIEWS; LEFT KNEE 3 VIEWS

[knee ap]
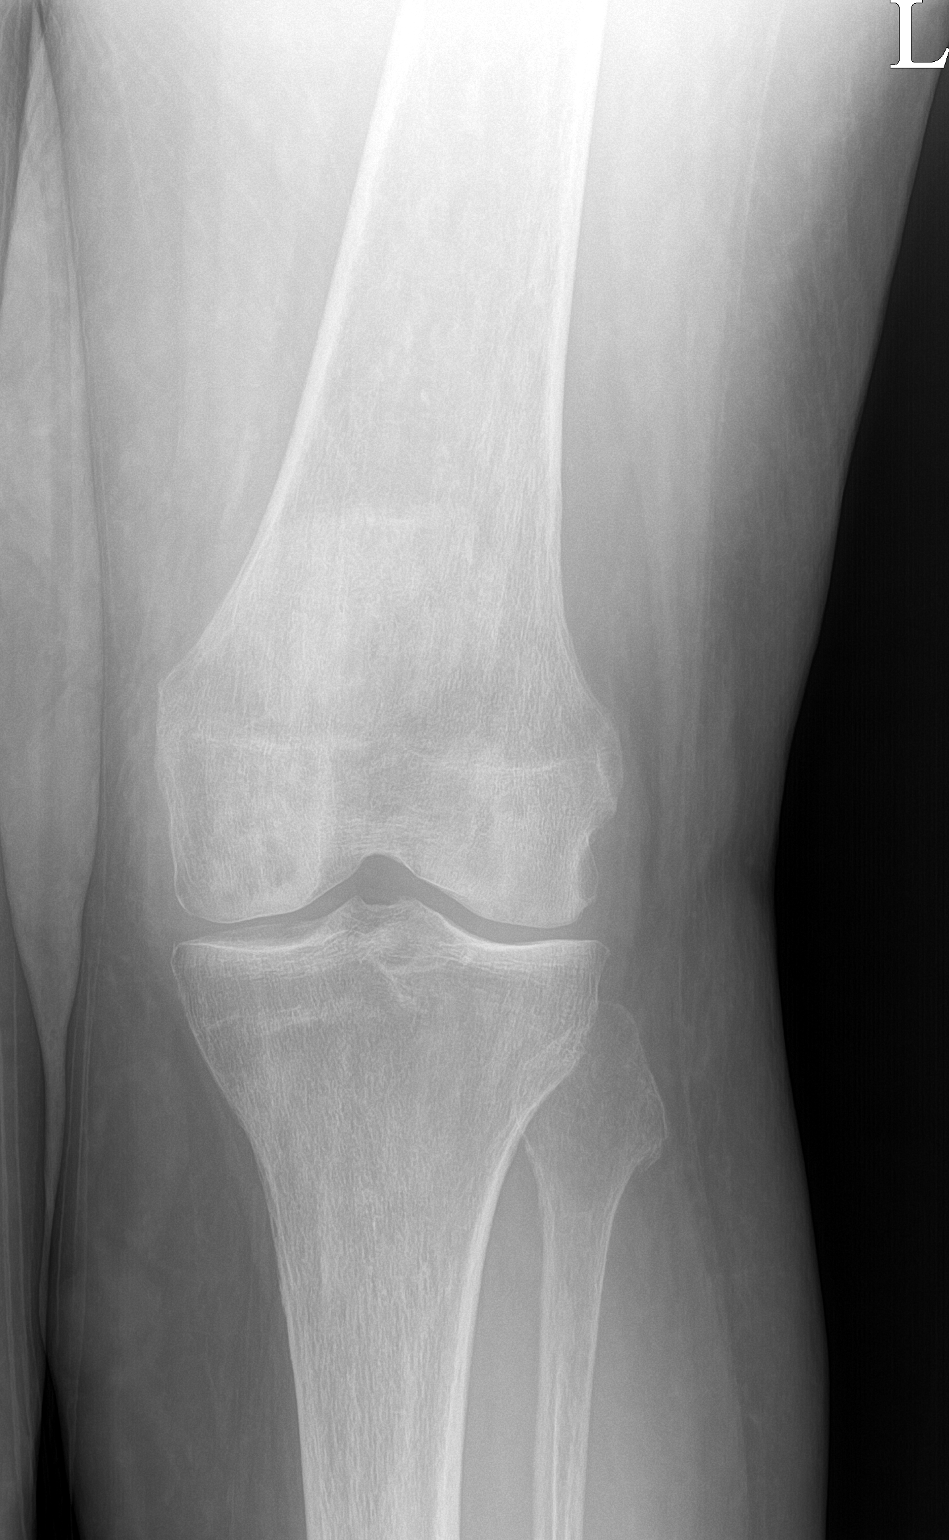

[knee lat]
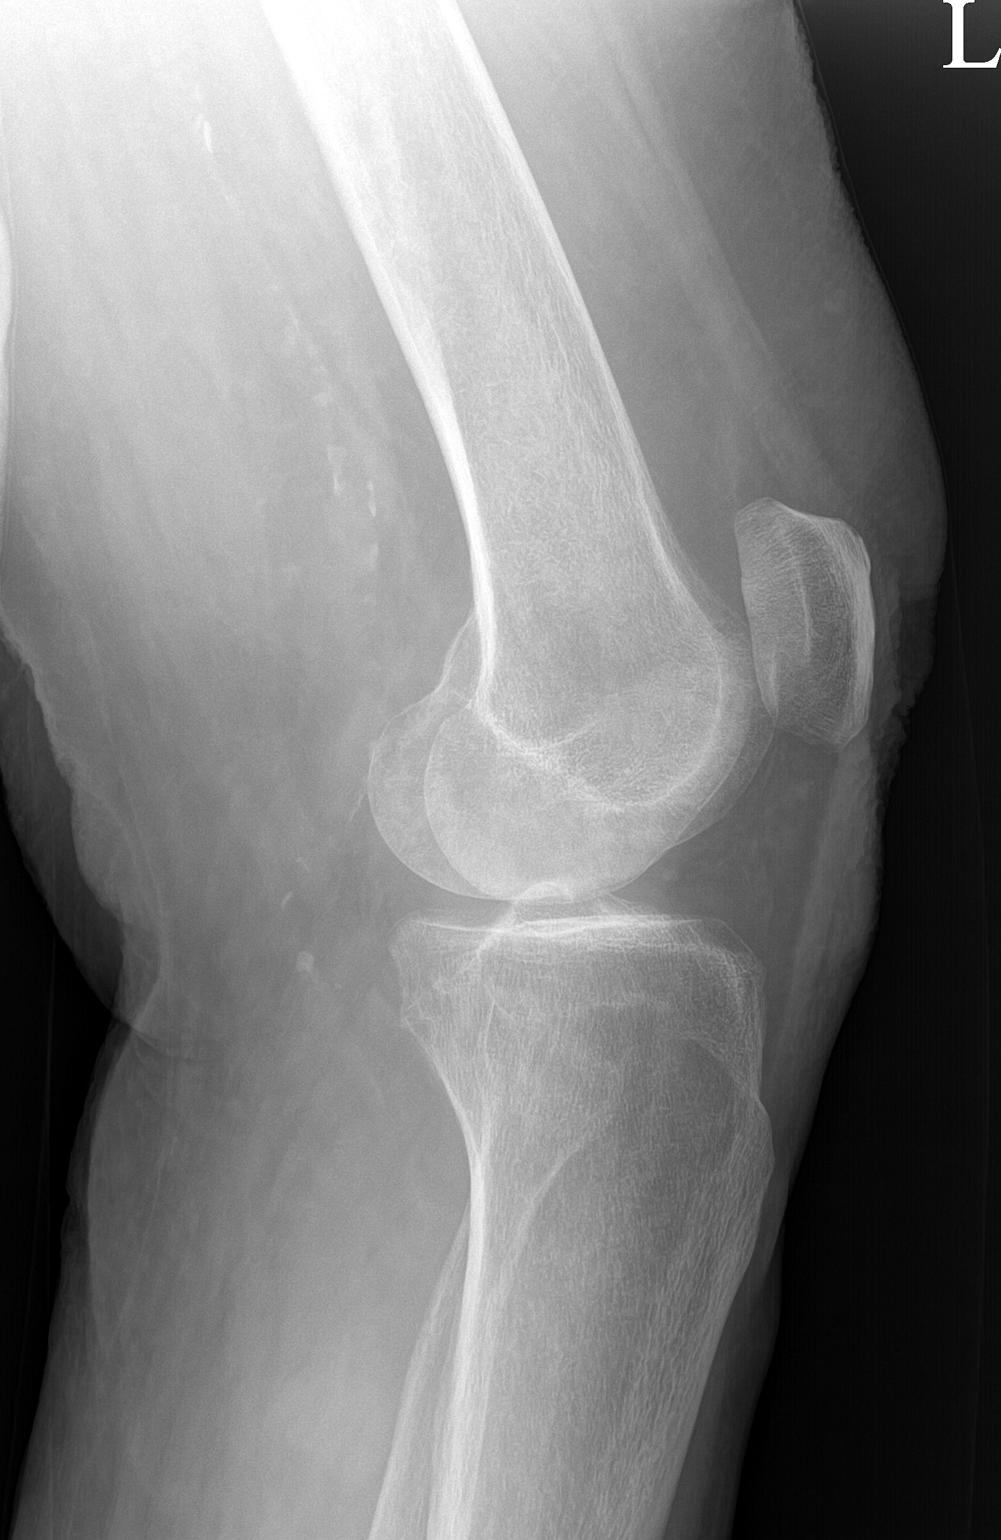

[patella]
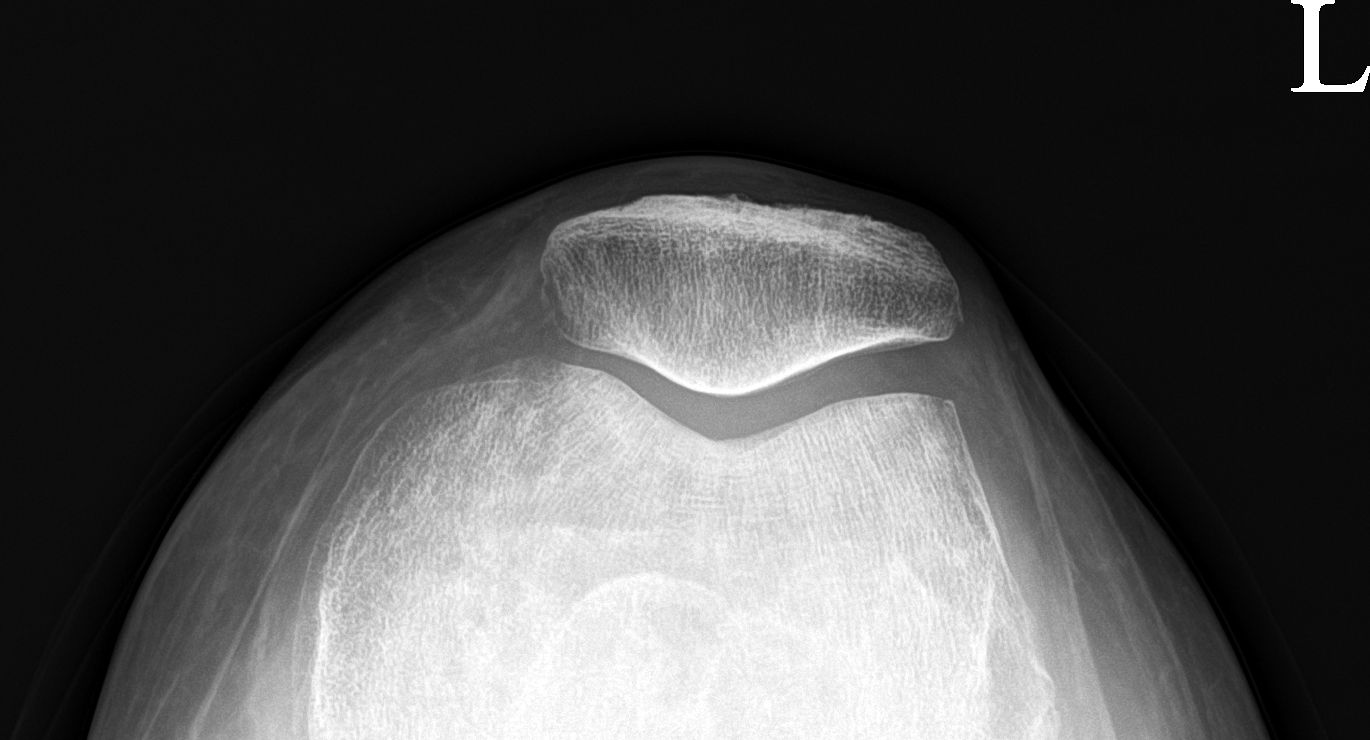

[3 of 3 positions shown; findings below may reference images not displayed]

FINDINGS: Right knee:

Mild-to-moderate lateral compartment joint space narrowing.
Mild-to-moderate patella alta. Mild patellofemoral joint space
narrowing. No joint effusion. No acute fracture or dislocation. Mild
vascular calcifications.

Left knee:

Mild-to-moderate lateral compartment joint space narrowing.
Mild-to-moderate patella alta. No joint effusion. No acute fracture
or dislocation. Mild-to-moderate vascular calcifications.
IMPRESSION: :
IMPRESSION: 1. Mild-to-moderate bilateral patella alta.
2. Bilateral mild-to-moderate lateral compartment osteoarthritis.

## 2023-12-12 ENCOUNTER — Ambulatory Visit: Payer: Self-pay

## 2023-12-12 NOTE — Telephone Encounter (Signed)
**Note De-identified  Woolbright Obfuscation** Please advise 

## 2023-12-12 NOTE — Telephone Encounter (Signed)
 FYI Only or Action Required?: Action required by provider: request for appointment and update on patient condition.  Patient was last seen in primary care on 10/28/2023 by Alvia Corean CROME, FNP.  Called Nurse Triage reporting Pruritis.  Symptoms began several days ago.  Interventions attempted: OTC medications: ointment, lotion, antihistamine and Prescription medications: as prescribed.  Symptoms are: rapidly worsening.  Triage Disposition: See Physician Within 24 Hours  Patient/caregiver understands and will follow disposition?: No, refuses disposition   Copied from CRM #[id number]. Topic: Clinical - Red Word Triage >> Dec 12, 2023 12:27 PM Harlene ORN wrote: Red Word that prompted transfer to Nurse Triage: itching all over getting worse in the past two weeks has stage 3 kidney failure Reason for Disposition  [1] MODERATE-SEVERE widespread itching (i.e., interferes with sleep, normal activities or school) AND [2] not improved after 24 hours of itching Care Advice  Answer Assessment - Initial Assessment Questions Additional info: 1) Patient has chronic pruritus that is normally managed with cream, ointment and antihistamine. Occasional blisters. Past few days pruritus has increased to severe, blister patches at hair line and arms. Her usual treatments are not helping. Family feels itching is due to her CKD.  2) No appointments available today in practice or regional offices, refused  urgent care, request to be worked into pcp schedule today for sdv. Please follow up.    1. DESCRIPTION: Describe the itching you are having.     Arms have blister patches  2. SEVERITY: How bad is it?      Worse since yesterday  3. SCRATCHING: Are there any scratch marks? Bleeding?     Yes-burning pain 4. ONSET: When did this begin? (e.g., minutes, hours, days ago)      Chronic-mild to moderate,  but severe past 2 days 5. CAUSE: What do you think is causing the itching? (ask about swimming  pools, pollen, animals, soaps, etc.)     Kidney disease  6. OTHER SYMPTOMS: Do you have any other symptoms? (e.g., fever, rash)     Denies, rash-blisters   Dermacil not helping. Antihistimines not helping.  Protocols used: Itching - Widespread-A-AH

## 2023-12-18 ENCOUNTER — Ambulatory Visit: Payer: Medicare Other

## 2023-12-19 ENCOUNTER — Ambulatory Visit

## 2023-12-19 VITALS — Ht 67.0 in | Wt 150.0 lb

## 2023-12-19 DIAGNOSIS — Z0000 Encounter for general adult medical examination without abnormal findings: Secondary | ICD-10-CM

## 2023-12-19 NOTE — Patient Instructions (Signed)
 Penny Miller,  Thank you for taking the time for your Medicare Wellness Visit. I appreciate your continued commitment to your health goals. Please review the care plan we discussed, and feel free to reach out if I can assist you further.  Medicare recommends these wellness visits once per year to help you and your care team stay ahead of potential health issues. These visits are designed to focus on prevention, allowing your provider to concentrate on managing your acute and chronic conditions during your regular appointments.  Please note that Annual Wellness Visits do not include a physical exam. Some assessments may be limited, especially if the visit was conducted virtually. If needed, we may recommend a separate in-person follow-up with your provider.  Ongoing Care Seeing your primary care provider every 3 to 6 months helps us  monitor your health and provide consistent, personalized care. Last office visit 10/28/2023.  You are due for a Flu vaccine.  Remember to call the office first thing on Monday morning to get a same day appointment to be evaluated for the itching.    Referrals If a referral was made during today's visit and you haven't received any updates within two weeks, please contact the referred provider directly to check on the status.  Recommended Screenings:  Health Maintenance  Topic Date Due   Eye exam for diabetics  06/14/2020   Flu Shot  10/31/2023   Medicare Annual Wellness Visit  12/17/2023   Hemoglobin A1C  02/12/2024   Complete foot exam   09/16/2024   DTaP/Tdap/Td vaccine (3 - Td or Tdap) 01/24/2030   Pneumococcal Vaccine for age over 42  Completed   DEXA scan (bone density measurement)  Completed   Zoster (Shingles) Vaccine  Completed   HPV Vaccine  Aged Out   Meningitis B Vaccine  Aged Out   COVID-19 Vaccine  Discontinued       12/19/2023    9:57 AM  Advanced Directives  Does Patient Have a Medical Advance Directive? No   Advance Care Planning is  important because it: Ensures you receive medical care that aligns with your values, goals, and preferences. Provides guidance to your family and loved ones, reducing the emotional burden of decision-making during critical moments.  Vision: Annual vision screenings are recommended for early detection of glaucoma, cataracts, and diabetic retinopathy. These exams can also reveal signs of chronic conditions such as diabetes and high blood pressure.  Dental: Annual dental screenings help detect early signs of oral cancer, gum disease, and other conditions linked to overall health, including heart disease and diabetes.  Please see the attached documents for additional preventive care recommendations.

## 2023-12-19 NOTE — Progress Notes (Signed)
 Subjective:   Penny Miller is a 89 y.o. who presents for a Medicare Wellness preventive visit.  As a reminder, Annual Wellness Visits don't include a physical exam, and some assessments may be limited, especially if this visit is performed virtually. We may recommend an in-person follow-up visit with your provider if needed.  Visit Complete: Virtual I connected with  Penny Miller on 12/19/23 by a audio enabled telemedicine application and verified that I am speaking with the correct person using two identifiers.  Patient Location: Home  Provider Location: Office/Clinic  I discussed the limitations of evaluation and management by telemedicine. The patient expressed understanding and agreed to proceed.  Vital Signs: Because this visit was a virtual/telehealth visit, some criteria may be missing or patient reported. Any vitals not documented were not able to be obtained and vitals that have been documented are patient reported.  VideoDeclined- This patient declined Librarian, academic. Therefore the visit was completed with audio only.  Persons Participating in Visit: Patient assisted by her son.  AWV Questionnaire: No: Patient Medicare AWV questionnaire was not completed prior to this visit.  Cardiac Risk Factors include: advanced age (>58men, >63 women);diabetes mellitus;hypertension     Objective:    Today's Vitals   12/19/23 0941  Weight: 150 lb (68 kg)  Height: 5' 7 (1.702 m)   Body mass index is 23.49 kg/m.     12/19/2023    9:57 AM 05/02/2023    6:00 PM 05/02/2023   10:06 AM 12/20/2022   12:31 AM 12/20/2022   12:21 AM 12/19/2022    9:14 PM 12/17/2022    3:06 PM  Advanced Directives  Does Patient Have a Medical Advance Directive? No Yes Yes  No No No  Type of Science writer of Attorney      Copy of Healthcare Power of Attorney in Chart?  No - copy requested No - copy requested, Physician  notified      Would patient like information on creating a medical advance directive?    No - Patient declined   No - Patient declined    Current Medications (verified) Outpatient Encounter Medications as of 12/19/2023  Medication Sig   acetaminophen  (TYLENOL ) 500 MG tablet Take 500 mg by mouth every 6 (six) hours as needed (pain).   albuterol  (PROVENTIL ) (2.5 MG/3ML) 0.083% nebulizer solution Take 3 mLs (2.5 mg total) by nebulization every 6 (six) hours as needed for wheezing or shortness of breath.   amLODipine  (NORVASC ) 2.5 MG tablet Take 1 tablet (2.5 mg total) by mouth at bedtime.   apixaban  (ELIQUIS ) 2.5 MG TABS tablet Take 1 tablet (2.5 mg total) by mouth 2 (two) times daily.   atorvastatin  (LIPITOR) 40 MG tablet Take 1 tablet (40 mg total) by mouth at bedtime.   Blood Glucose Monitoring Suppl (ONE TOUCH ULTRA 2) w/Device KIT 1 Act by Does not apply route daily.   citalopram  (CELEXA ) 20 MG tablet Take 1 tablet (20 mg total) by mouth at bedtime.   dapagliflozin  propanediol (FARXIGA ) 10 MG TABS tablet TAKE 1 TABLET DAILY BEFORE BREAKFAST   famotidine  (PEPCID ) 20 MG tablet TAKE 1 TABLET ONCE TO TWICE A DAY   gabapentin  (NEURONTIN ) 100 MG capsule Take 1 capsule (100 mg total) by mouth 3 (three) times daily.   glucose blood test strip 1 each by Other route daily. Use as instructed   Lancets (ONETOUCH DELICA PLUS LANCET33G) MISC 1 Act by Does not apply  route daily.   triamcinolone  ointment (KENALOG ) 0.5 % Apply 1 Application topically 2 (two) times daily.   Vitamin D , Ergocalciferol , (DRISDOL ) 1.25 MG (50000 UNIT) CAPS capsule TAKE 1 CAPSULE EVERY 7 DAYS (Patient taking differently: Take 50,000 Units by mouth every 7 (seven) days.)   ferrous sulfate  325 (65 FE) MG EC tablet Take 325 mg by mouth daily with breakfast. With Vitamin C (Patient not taking: Reported on 12/19/2023)   No facility-administered encounter medications on file as of 12/19/2023.    Allergies (verified) Adhesive [tape],  Ceclor [cefaclor], Codeine, Jardiance  [empagliflozin ], Lopid [gemfibrozil], Metformin  and related, Metoprolol, Morphine and codeine, Niacin and related, Relafen [nabumetone], Septra  [sulfamethoxazole -trimethoprim ], and Tetracyclines & related   History: Past Medical History:  Diagnosis Date   Allergic rhinitis, cause unspecified    Anxiety state, unspecified    Arthritis    Colon polyp    Diaphragmatic hernia without mention of obstruction or gangrene    Diverticulosis of colon (without mention of hemorrhage)    Female stress incontinence    HTN (hypertension)    Insomnia, unspecified    Interstitial cystitis    surgery 02/01/2013   Irritable bowel syndrome    Memory loss    Other and unspecified hyperlipidemia    Other premature beats    Pacemaker 2024   Polyneuropathy in diabetes(357.2)    Postmenopausal bleeding    Proteinuria    Rectal ulcer    Skin cancer    Stroke (HCC) 2018   Type II or unspecified type diabetes mellitus with neurological manifestations, not stated as uncontrolled(250.60)    Unspecified essential hypertension    Viral warts, unspecified    Past Surgical History:  Procedure Laterality Date   ABDOMINAL HYSTERECTOMY  1977   Dr.Hambright   APPENDECTOMY  1965   CATARACT EXTRACTION, BILATERAL  02/10/2006   Dr.Groat    COLONOSCOPY  07/17/2004   polypectomy   CYSTOSCOPY  1987   Dr.Bradley   LOOP RECORDER INSERTION N/A 07/31/2016   Procedure: Loop Recorder Insertion;  Surgeon: Danelle LELON Birmingham, MD;  Location: [facility];  Service: Cardiovascular;  Laterality: N/A;   LOOP RECORDER REMOVAL N/A 05/15/2022   Procedure: LOOP RECORDER REMOVAL;  Surgeon: Birmingham Danelle LELON, MD;  Location: [facility];  Service: Cardiovascular;  Laterality: N/A;   PACEMAKER IMPLANT N/A 05/15/2022   Procedure: PACEMAKER IMPLANT;  Surgeon: Birmingham Danelle LELON, MD;  Location: [facility];  Service: Cardiovascular;  Laterality: N/A;   TEE WITHOUT CARDIOVERSION N/A 07/30/2016    Procedure: TRANSESOPHAGEAL ECHOCARDIOGRAM (TEE);  Surgeon: Maude JAYSON Emmer, MD;  Location: [facility] ENDOSCOPY;  Service: Cardiovascular;  Laterality: N/A;   TRIGGER FINGER RELEASE  1990   Dr.Carter   TRIGGER FINGER RELEASE  05/13/16   urinary bladder      surgery 02/01/2013 to stretch bladder stem   Family History  Problem Relation Age of Onset   Pancreatic cancer Mother 38   Heart attack Father    Bladder Cancer Father    Hypertension Sister    Diabetes Sister    COPD Sister    Hypertension Son    Diabetes Brother    Heart disease Brother    Hypertension Brother    Diabetes Brother    Heart disease Brother 48       Myocardial Infarction    Heart attack Brother    Social History   Socioeconomic History   Marital status: Widowed    Spouse name: Not on file   Number of  children: 1   Years of education: Not on file   Highest education level: Not on file  Occupational History   Occupation: Teacher retired    Comment: Engineer, agricultural   Tobacco Use   Smoking status: Some Days    Current packs/day: 0.00    Average packs/day: 0.3 packs/day for 62.0 years (15.5 ttl pk-yrs)    Types: Cigarettes    Start date: 04/01/1949    Last attempt to quit: 04/02/2011    Years since quitting: 12.7   Smokeless tobacco: Never   Tobacco comments:    stopped consistently smoking in 2012 but still smokes occasionally (one pack per week)  Vaping Use   Vaping status: Never Used  Substance and Sexual Activity   Alcohol use: No   Drug use: No   Sexual activity: Not Currently  Other Topics Concern   Not on file  Social History Narrative   Ophthalmologist-Dr.Gould and Dr.Groat   Podiatrist- Dr.Tuchman   Dermatologist- Dr.Lupton   Urologist- Dr. Renay Miller    Lives with her son./2025   Social Drivers of Health   Financial Resource Strain: Low Risk  (12/19/2023)   Overall Financial Resource Strain (CARDIA)    Difficulty of Paying Living Expenses: Not hard at all  Food Insecurity: No Food Insecurity  (12/19/2023)   Hunger Vital Sign    Worried About Running Out of Food in the Last Year: Never true    Ran Out of Food in the Last Year: Never true  Transportation Needs: No Transportation Needs (12/19/2023)   PRAPARE - Administrator, Civil Service (Medical): No    Lack of Transportation (Non-Medical): No  Physical Activity: Inactive (12/19/2023)   Exercise Vital Sign    Days of Exercise per Week: 0 days    Minutes of Exercise per Session: 0 min  Stress: Stress Concern Present (12/19/2023)   [employer] of Occupational Health - Occupational Stress Questionnaire    Feeling of Stress: To some extent  Social Connections: Socially Isolated (12/19/2023)   Social Connection and Isolation Panel    Frequency of Communication with Friends and Family: Once a week    Frequency of Social Gatherings with Friends and Family: Never    Attends Religious Services: Never    Database administrator or Organizations: No    Attends Banker Meetings: Never    Marital Status: Widowed    Tobacco Counseling Ready to quit: Not Answered Counseling given: Not Answered Tobacco comments: stopped consistently smoking in 2012 but still smokes occasionally (one pack per week)    Clinical Intake:  Pre-visit preparation completed: Yes  Pain : No/denies pain     BMI - recorded: 23.49 Nutritional Status: BMI of 19-24  Normal Nutritional Risks: Nausea/ vomitting/ diarrhea Diabetes: Yes CBG done?: No (yesterday 124-per pt) CBG resulted in Enter/ Edit results?: No Did pt. bring in CBG monitor from home?: No  Lab Results  Component Value Date   HGBA1C 6.5 08/12/2023   HGBA1C 6.5 12/30/2022   HGBA1C 4.9 01/23/2022     How often do you need to have someone help you when you read instructions, pamphlets, or other written materials from your doctor or pharmacy?: 3 - Sometimes (son helps her)  Interpreter Needed?: No  Information entered by :: Darcy Barbara, RMA   Activities  of Daily Living     12/19/2023    9:45 AM 05/02/2023    3:15 PM  In your present state of health, do you have any  difficulty performing the following activities:  Hearing? 1 1  Comment has a hearing problem-per pt   Vision? 0 0  Difficulty concentrating or making decisions? 0 0  Walking or climbing stairs? 0   Dressing or bathing? 0   Doing errands, shopping? 0 1  Comment son drives her around   Preparing Food and eating ? N   Using the Toilet? N   In the past six months, have you accidently leaked urine? Y   Comment wears a depends   Do you have problems with loss of bowel control? N   Managing your Medications? N   Managing your Finances? N   Housekeeping or managing your Housekeeping? N     Patient Care Team: Joshua Debby CROME, MD as PCP - General (Internal Medicine) Lavona Agent, MD as PCP - Cardiology (Cardiology) Waddell Danelle ORN, MD as PCP - Electrophysiology (Cardiology) Scot Banda, MD as Referring Physician (Dermatology) Robinson Idol, MD as Consulting Physician (Ophthalmology) Volanda Charlie BROCKS, DPM (Inactive) as Consulting Physician (Podiatry) Octavia Charleston, MD as Consulting Physician (Ophthalmology) Nicholaus Tanda CROME DOUGLAS, MD as Attending Physician (Urology) Lavona Agent, MD as Consulting Physician (Cardiology) Cary No, NP as Nurse Practitioner (Family Medicine) Armbruster, Elspeth SQUIBB, MD as Consulting Physician (Gastroenterology) Joshua Debby CROME, MD (Internal Medicine)  I have updated your Care Teams any recent Medical Services you may have received from other providers in the past year.     Assessment:   This is a routine wellness examination for Jeniyah.  Hearing/Vision screen Hearing Screening - Comments:: has a hearing problem-per pt Vision Screening - Comments:: Had cataract surgery/Dr. Robinson   Goals Addressed             This Visit's Progress    Client understands the importance of follow-up with providers by attending scheduled visits   On  track      Depression Screen     12/19/2023   10:02 AM 12/17/2022    3:18 PM 08/19/2022    3:59 PM 03/04/2022    9:12 AM 02/20/2022   11:05 AM 11/05/2021    4:48 PM 08/07/2021   10:12 AM  PHQ 2/9 Scores  PHQ - 2 Score 0 0 1 0 0 0 4  PHQ- 9 Score 0 3 3 3 5  0 19    Fall Risk     12/19/2023    9:59 AM 08/28/2023    3:16 PM 12/17/2022    3:10 PM 08/19/2022    3:58 PM 03/04/2022    9:12 AM  Fall Risk   Falls in the past year? 0 0 1 0 0  Number falls in past yr: 0 0 0 0 0  Injury with Fall? 0 0 1 0 0  Risk for fall due to : Impaired balance/gait No Fall Risks History of fall(s);Impaired balance/gait;Orthopedic patient Impaired balance/gait No Fall Risks  Follow up Falls evaluation completed;Falls prevention discussed Falls evaluation completed Education provided;Falls prevention discussed;Falls evaluation completed Falls evaluation completed Falls evaluation completed      Data saved with a previous flowsheet row definition    MEDICARE RISK AT HOME:  Medicare Risk at Home Any stairs in or around the home?: Yes (has a ramp) If so, are there any without handrails?: No Home free of loose throw rugs in walkways, pet beds, electrical cords, etc?: Yes Adequate lighting in your home to reduce risk of falls?: Yes Life alert?: No Use of a cane, walker or w/c?: Yes (uses a walker sometimes) Grab bars in  the bathroom?: Yes Shower chair or bench in shower?: Yes Elevated toilet seat or a handicapped toilet?: Yes  TIMED UP AND GO:  Was the test performed?  No  Cognitive Function: Declined/Normal: No cognitive concerns noted by patient or family. Patient alert, oriented, able to answer questions appropriately and recall recent events. No signs of memory loss or confusion.    07/21/2017   10:23 AM 05/07/2016    9:18 AM 12/21/2013    3:42 PM  MMSE - Mini Mental State Exam  Orientation to time 5 5  5    Orientation to Place 5 4  4    Registration 3 3  3    Attention/ Calculation 5 5  5    Recall 3  3  2    Language- name 2 objects 2 2  2    Language- repeat 1 1 1   Language- follow 3 step command 3 3  3    Language- read & follow direction 1 1  1    Write a sentence 1 1  1    Copy design 1 1  1    Total score 30 29  28       Data saved with a previous flowsheet row definition        07/27/2019   10:34 AM 07/24/2018   11:11 AM  6CIT Screen  What Year? 0 points 0 points  What month? 0 points 0 points  What time? 0 points 0 points  Count back from 20 0 points 0 points  Months in reverse 0 points 0 points  Repeat phrase 4 points 0 points  Total Score 4 points 0 points    Immunizations Immunization History  Administered Date(s) Administered   Fluad Quad(high Dose 65+) 02/03/2020, 12/26/2020, 01/23/2022   Fluad Trivalent(High Dose 65+) 12/30/2022   INFLUENZA, HIGH DOSE SEASONAL PF 12/31/2016, 12/15/2017, 01/25/2019   Influenza,inj,Quad PF,6+ Mos 12/15/2012, 12/21/2013, 02/28/2015, 02/02/2016   Influenza-Unspecified 01/22/2012   PFIZER(Purple Top)SARS-COV-2 Vaccination 05/14/2019, 06/08/2019   Pneumococcal Conjugate-13 05/07/2016   Pneumococcal Polysaccharide-23 11/03/2019   Pneumococcal-Unspecified 01/24/1995   Td 09/14/2008   Tdap 01/25/2020   Zoster Recombinant(Shingrix) 12/23/2019, 06/13/2020    Screening Tests Health Maintenance  Topic Date Due   OPHTHALMOLOGY EXAM  06/14/2020   Influenza Vaccine  10/31/2023   Medicare Annual Wellness (AWV)  12/17/2023   HEMOGLOBIN A1C  02/12/2024   FOOT EXAM  09/16/2024   DTaP/Tdap/Td (3 - Td or Tdap) 01/24/2030   Pneumococcal Vaccine: 50+ Years  Completed   DEXA SCAN  Completed   Zoster Vaccines- Shingrix  Completed   HPV VACCINES  Aged Out   Meningococcal B Vaccine  Aged Out   COVID-19 Vaccine  Discontinued    Health Maintenance Items Addressed: See Nurse Notes at the end of this note  Additional Screening:  Vision Screening: Recommended annual ophthalmology exams for early detection of glaucoma and other disorders of the  eye. Is the patient up to date with their annual eye exam?  No  Who is the provider or what is the name of the office in which the patient attends annual eye exams? Dr. Robinson  Dental Screening: Recommended annual dental exams for proper oral hygiene  Community Resource Referral / Chronic Care Management: CRR required this visit?  No   CCM required this visit?  No   Plan:    I have personally reviewed and noted the following in the patient's chart:   Medical and social history Use of alcohol, tobacco or illicit drugs  Current medications and supplements including opioid prescriptions.  Patient is not currently taking opioid prescriptions. Functional ability and status Nutritional status Physical activity Advanced directives List of other physicians Hospitalizations, surgeries, and ER visits in previous 12 months Vitals Screenings to include cognitive, depression, and falls Referrals and appointments  In addition, I have reviewed and discussed with patient certain preventive protocols, quality metrics, and best practice recommendations. A written personalized care plan for preventive services as well as general preventive health recommendations were provided to patient.   Willard Farquharson L Tamaka Sawin, CMA   12/19/2023   After Visit Summary: (MyChart) Due to this being a telephonic visit, the after visit summary with patients personalized plan was offered to patient via MyChart   Notes: Patient is due for a Flu Vaccine.  She states that she has been severely itching all over and that her son called and spoke with a nurse on Friday, 12/12/23, however they have not heard back from anyone about it.  I informed patient that Dr. Joshua was not in office today.  I informed them to call office Monday morning to get in for a same day appointment with Dr. Norleen, as Dr. Joshua does not have anything on Monday, 12/22/23.  (Please see nurse note from 12/12/2023).

## 2023-12-22 ENCOUNTER — Ambulatory Visit: Payer: Self-pay

## 2023-12-22 NOTE — Telephone Encounter (Signed)
 FYI Only or Action Required?: FYI only for provider.  Patient was last seen in primary care on 10/28/2023 by Alvia Corean CROME, FNP.  Called Nurse Triage reporting Rash and Pruritis.  Symptoms began couple years ago.  Interventions attempted: OTC medications: creams, lotions and antihistamine.  Symptoms are: red, itchy rash to legs, arms, trunk, back, neck gradually worsening x couple of weeks; occasional SOB and wheezing x couple of years.  Triage Disposition: See Physician Within 24 Hours  Patient/caregiver understands and will follow disposition?: Yes           Copied from CRM #[id number]. Topic: Clinical - Red Word Triage >> Dec 22, 2023  8:02 AM Zy'onna H wrote: [facility] that prompted transfer to Nurse Triage:   Patient is in stage 3 kidney failure and the patient experiencing a rash all over her body.  Causing Itching and Pain  Patient and family want to know if this is a result or is something else is going on.  Rash has gotten worse  Transferring to NT Reason for Disposition  SEVERE itching (i.e., interferes with sleep, normal activities or school)  Answer Assessment - Initial Assessment Questions 1. APPEARANCE of RASH: What does the rash look like? (e.g., blisters, dry flaky skin, red spots, redness, sores)     Round, red blister patches sometimes, he states generally it is little bumps but states they are not fluid filled blisters.  2. SIZE: How big are the spots? (e.g., tip of pen, eraser, coin; inches, centimeters)     About the size of a quarter.  3. LOCATION: Where is the rash located?     Arms, legs, back, stomach, back of neck.  4. COLOR: What color is the rash? (Note: It is difficult to assess rash color in people with darker-colored skin. When this situation occurs, simply ask the caller to describe what they see.)     Red.  5. ONSET: When did the rash begin?     Couple years, comes in cycles. Most recent cycle has worsened x couple  weeks.  6. FEVER: Do you have a fever? If Yes, ask: What is your temperature, how was it measured, and when did it start?     Unsure.  7. ITCHING: Does the rash itch? If Yes, ask: How bad is the itch? (Scale 1-10; or mild, moderate, severe)     Yes. Severe at times, especially in the morning.  8. CAUSE: What do you think is causing the rash?     Son states he thinks it is related to her CKD.  9. MEDICINE FACTORS: Have you started any new medicines within the last 2 weeks? (e.g., antibiotics)      No.  10. OTHER SYMPTOMS: Do you have any other symptoms? (e.g., dizziness, headache, sore throat, joint pain)       Son states the patient gets winded with exertion and wheezing x couple years. Denies SOB/difficulty breathing, tongue swelling, facial swelling, difficulty swallowing. He states the patient also has nausea and weakness at times.  11. PREGNANCY: Is there any chance you are pregnant? When was your last menstrual period?       N/A.  Protocols used: Rash or Redness - [facility]

## 2023-12-23 ENCOUNTER — Encounter: Payer: Self-pay | Admitting: Internal Medicine

## 2023-12-23 ENCOUNTER — Ambulatory Visit: Admitting: Internal Medicine

## 2023-12-23 VITALS — BP 150/90 | HR 84 | Temp 97.6°F | Resp 16 | Ht 67.0 in | Wt 149.4 lb

## 2023-12-23 DIAGNOSIS — R300 Dysuria: Secondary | ICD-10-CM | POA: Diagnosis not present

## 2023-12-23 DIAGNOSIS — Z23 Encounter for immunization: Secondary | ICD-10-CM

## 2023-12-23 DIAGNOSIS — I480 Paroxysmal atrial fibrillation: Secondary | ICD-10-CM | POA: Diagnosis not present

## 2023-12-23 DIAGNOSIS — N184 Chronic kidney disease, stage 4 (severe): Secondary | ICD-10-CM

## 2023-12-23 DIAGNOSIS — E538 Deficiency of other specified B group vitamins: Secondary | ICD-10-CM | POA: Diagnosis not present

## 2023-12-23 DIAGNOSIS — L308 Other specified dermatitis: Secondary | ICD-10-CM

## 2023-12-23 DIAGNOSIS — L2084 Intrinsic (allergic) eczema: Secondary | ICD-10-CM

## 2023-12-23 DIAGNOSIS — N1832 Chronic kidney disease, stage 3b: Secondary | ICD-10-CM | POA: Diagnosis not present

## 2023-12-23 DIAGNOSIS — E1122 Type 2 diabetes mellitus with diabetic chronic kidney disease: Secondary | ICD-10-CM

## 2023-12-23 MED ORDER — CYANOCOBALAMIN 1000 MCG/ML IJ SOLN
1000.0000 ug | Freq: Once | INTRAMUSCULAR | Status: AC
  Administered 2023-12-23: 1000 ug via INTRAMUSCULAR

## 2023-12-23 MED ORDER — HYDROXYZINE HCL 10 MG PO TABS: 10.0000 mg | ORAL_TABLET | Freq: Three times a day (TID) | ORAL | 0 refills | Status: DC | PRN

## 2023-12-23 MED ORDER — CYANOCOBALAMIN 2000 MCG PO TABS: 2000.0000 ug | ORAL_TABLET | Freq: Every day | ORAL | 0 refills | Status: AC

## 2023-12-23 NOTE — Patient Instructions (Signed)
 Pruritus Pruritus is an itchy feeling on the skin. One of the most common causes is dry skin, but many different things can cause itching. Most cases of itching do not require medical attention. Sometimes itchy skin can turn into a rash or a secondary infection. Follow these instructions at home: Skin care  Do not use scented soaps, detergents, perfumes, and cosmetic products. Instead, use gentle, unscented versions of these items. Apply moisturizing creams to your skin frequently, at least twice daily. Apply immediately after bathing while skin is still wet. Take medicines or apply medicated creams only as told by your health care provider. This may include: Corticosteroid cream or topical calcineurin inhibitor. Anti-itch lotions containing urea, camphor, or menthol. Oral antihistamines. Do not take hot showers or baths, which can make itching worse. A short, cool shower may help with itching as long as you apply moisturizing lotion after the shower. Apply a cool, wet cloth (cool compress) to the affected areas. You may take lukewarm baths with one of the following: Epsom salts. You can get these at your local pharmacy or grocery store. Follow the instructions on the packaging. Baking soda. Pour a small amount into the bath as told by your health care provider. Colloidal oatmeal. You can get this at your local pharmacy or grocery store. Follow the instructions on the packaging. Do not scratch your skin. General instructions Avoid wearing tight clothes. Keep a journal to help find out what is causing your itching. Write down: What you eat and drink. What cosmetic products you use. What soaps or detergents you use. What you wear, including jewelry. Use a humidifier. This keeps the air moist, which helps to prevent dry skin. Be aware of any changes in your itchiness. Tell your health care provider about any changes. Contact a health care provider if: The itching does not go away after  several days. You notice redness, warmth, or drainage on the skin where you have scratched. You are unusually thirsty or urinating more than normal. Your skin tingles or feels numb. Your skin or the white parts of your eyes turn yellow (jaundice). You feel weak. You have any of the following: Night sweats. Tiredness (fatigue). Weight loss. Abdominal pain. Summary Pruritus is an itchy feeling on the skin. One of the most common causes is dry skin, but many different conditions and factors can cause itching. Apply moisturizing creams to your skin frequently, at least twice daily. Apply immediately after bathing while skin is still wet. Take medicines or apply medicated creams only as told by your health care provider. Do not take hot showers or baths. Do not use scented soaps, detergents, perfumes, or cosmetic products. Keep a journal to help find out what is causing your itching. This information is not intended to replace advice given to you by your health care provider. Make sure you discuss any questions you have with your health care provider. Document Revised: 04/25/2021 Document Reviewed: 04/25/2021 Elsevier Patient Education  2024 [employer].

## 2023-12-23 NOTE — Progress Notes (Signed)
 Subjective:  Patient ID: Penny Miller, female    DOB: [birth year]  Age: 89 y.o. MRN: [id number]  CC: Rash (All over and its very itching. ), Hypertension, and Diabetes   HPI Penny Miller presents for f/up   Discussed the use of AI scribe software for clinical note transcription with the patient, who gave verbal consent to proceed.  History of Present Illness Penny Miller is a 89 year old female with kidney disease who presents with a widespread rash and sores.  She has been experiencing a peculiar rash and sores for an unspecified duration, located on her back, legs, and sporadically in other areas. The sores are peeling and becoming flat, causing significant itching and discomfort. Various topical treatments, including Dermasil eczema relief and triamcinolone  cream, provide temporary relief from itching for about five hours. An antihistamine was ineffective after a month of use.  She has a history of kidney disease, previously noted to have worsened to stage four but improved back to stage three after discontinuing trimethoprim . She is concerned that her current skin issues may be related to her kidney function. She reports fluctuating urination patterns, with some days of reduced urination and others with increased frequency. She experiences burning during urination, which resolves spontaneously.  She takes two Tylenol  at night to aid sleep, as she experiences difficulty sleeping without it. She also mentions feeling weak at times, which has improved slightly with oral B12 supplementation of 1000 mg daily for about four weeks. Her son recalls she previously received B12 injections weekly for four weeks, followed by oral supplementation, although she does not remember receiving the injections.  She notes swelling in her knees. She has experienced significant weakness, impacting her ability to perform daily activities, although this has improved slightly with B12  supplementation.     Outpatient Medications Prior to Visit  Medication Sig Dispense Refill   acetaminophen  (TYLENOL ) 500 MG tablet Take 500 mg by mouth every 6 (six) hours as needed (pain).     albuterol  (PROVENTIL ) (2.5 MG/3ML) 0.083% nebulizer solution Take 3 mLs (2.5 mg total) by nebulization every 6 (six) hours as needed for wheezing or shortness of breath. 150 mL 3   amLODipine  (NORVASC ) 2.5 MG tablet Take 1 tablet (2.5 mg total) by mouth at bedtime. 90 tablet 1   apixaban  (ELIQUIS ) 2.5 MG TABS tablet Take 1 tablet (2.5 mg total) by mouth 2 (two) times daily. 180 tablet 1   atorvastatin  (LIPITOR) 40 MG tablet Take 1 tablet (40 mg total) by mouth at bedtime. 90 tablet 1   Blood Glucose Monitoring Suppl (ONE TOUCH ULTRA 2) w/Device KIT 1 Act by Does not apply route daily. 1 kit 2   citalopram  (CELEXA ) 20 MG tablet Take 1 tablet (20 mg total) by mouth at bedtime. 90 tablet 1   dapagliflozin  propanediol (FARXIGA ) 10 MG TABS tablet TAKE 1 TABLET DAILY BEFORE BREAKFAST 90 tablet 0   famotidine  (PEPCID ) 20 MG tablet TAKE 1 TABLET ONCE TO TWICE A DAY 60 tablet 2   ferrous sulfate  325 (65 FE) MG EC tablet Take 325 mg by mouth daily with breakfast. With Vitamin C     glucose blood test strip 1 each by Other route daily. Use as instructed 100 each 5   Lancets (ONETOUCH DELICA PLUS LANCET33G) MISC 1 Act by Does not apply route daily. 100 each 5   triamcinolone  ointment (KENALOG ) 0.5 % Apply 1  Application topically 2 (two) times daily. 30 g 0   Vitamin D , Ergocalciferol , (DRISDOL ) 1.25 MG (50000 UNIT) CAPS capsule TAKE 1 CAPSULE EVERY 7 DAYS (Patient taking differently: Take 50,000 Units by mouth every 7 (seven) days.) 6 capsule 0   gabapentin  (NEURONTIN ) 100 MG capsule Take 1 capsule (100 mg total) by mouth 3 (three) times daily. 90 capsule 0   No facility-administered medications prior to visit.    ROS Review of Systems  Constitutional:  Negative for appetite change, chills, diaphoresis,  fatigue and fever.  HENT: Negative.    Eyes: Negative.  Negative for visual disturbance.  Respiratory:  Negative for cough, chest tightness, shortness of breath and wheezing.   Cardiovascular:  Negative for chest pain, palpitations and leg swelling.  Gastrointestinal: Negative.  Negative for abdominal pain, constipation, diarrhea, nausea and vomiting.  Endocrine: Negative.   Genitourinary:  Positive for dysuria. Negative for decreased urine volume, difficulty urinating, flank pain, hematuria, pelvic pain and urgency.  Musculoskeletal: Negative.   Skin:  Positive for color change and rash. Negative for pallor and wound.  Neurological:  Negative for dizziness and weakness.  Hematological:  Negative for adenopathy. Does not bruise/bleed easily.  Psychiatric/Behavioral:  Positive for confusion and decreased concentration. The patient is nervous/anxious.     Objective:  BP (!) 150/90 (BP Location: Left Arm, Patient Position: Sitting, Cuff Size: Normal)   Pulse 84   Temp 97.6 F (36.4 C) (Oral)   Resp 16   Ht 5' 7 (1.702 m)   Wt 149 lb 6.4 oz (67.8 kg)   SpO2 95%   BMI 23.40 kg/m   BP Readings from Last 3 Encounters:  12/23/23 (!) 150/90  11/05/23 (!) 158/68  10/28/23 (!) 140/78    Wt Readings from Last 3 Encounters:  12/23/23 149 lb 6.4 oz (67.8 kg)  12/19/23 150 lb (68 kg)  11/05/23 150 lb 3.2 oz (68.1 kg)    Physical Exam Vitals reviewed.  Constitutional:      Appearance: Normal appearance.  HENT:     Nose: Nose normal.     Mouth/Throat:     Mouth: Mucous membranes are moist.  Eyes:     General: No scleral icterus.    Conjunctiva/sclera: Conjunctivae normal.  Cardiovascular:     Rate and Rhythm: Normal rate and regular rhythm.     Heart sounds: No murmur heard.    No friction rub. No gallop.  Pulmonary:     Effort: Pulmonary effort is normal.     Breath sounds: No stridor. No wheezing, rhonchi or rales.  Abdominal:     General: Abdomen is flat.     Palpations:  There is no mass.     Tenderness: There is no abdominal tenderness. There is no guarding.     Hernia: No hernia is present.  Musculoskeletal:     Cervical back: Neck supple.     Right lower leg: No edema.     Left lower leg: No edema.  Lymphadenopathy:     Cervical: No cervical adenopathy.  Skin:    Coloration: Skin is not jaundiced or pale.     Findings: Erythema, lesion and rash present. No bruising.  Neurological:     Mental Status: She is alert. Mental status is at baseline.  Psychiatric:        Attention and Perception: She is inattentive.        Mood and Affect: Mood and affect normal.        Speech: Speech is delayed and  tangential.        Behavior: Behavior normal.        Thought Content: Thought content normal. Thought content is not paranoid.        Cognition and Memory: Cognition is impaired. Memory is impaired. She exhibits impaired recent memory and impaired remote memory.     Lab Results  Component Value Date   WBC 6.1 10/28/2023   HGB 14.0 10/28/2023   HCT 42.6 10/28/2023   PLT 155.0 10/28/2023   GLUCOSE 127 (H) 10/28/2023   CHOL 148 10/28/2023   TRIG 164.0 (H) 10/28/2023   HDL 48.00 10/28/2023   LDLCALC 67 10/28/2023   ALT 11 10/28/2023   AST 15 10/28/2023   NA 138 10/28/2023   K 4.5 10/28/2023   CL 102 10/28/2023   CREATININE 1.50 (H) 10/28/2023   BUN 19 10/28/2023   CO2 28 10/28/2023   TSH 2.43 10/28/2023   INR 1.2 07/16/2021   HGBA1C 6.5 08/12/2023   MICROALBUR 9.7 (H) 10/28/2023    DG Chest Portable 1 View Result Date: 05/02/2023 CLINICAL DATA:  Shortness of breath EXAM: PORTABLE CHEST 1 VIEW COMPARISON:  Chest radiograph dated 12/19/2022 FINDINGS: Lines/tubes: Left chest wall pacemaker leads project over the right atrium and ventricle. Lungs: Well inflated lungs. Left mid lung linear opacities. Pleura: No pneumothorax or pleural effusion. Heart/mediastinum: Similar  cardiomediastinal silhouette. Bones: No acute osseous abnormality. IMPRESSION:  Left mid lung linear opacities, likely atelectasis/scarring. No focal consolidations. Electronically Signed   By: Limin  Xu M.D.   On: 05/02/2023 10:48    Assessment & Plan:  Need for immunization against influenza -     Flu vaccine HIGH DOSE PF(Fluzone Trivalent)  Intrinsic eczema -     hydrOXYzine  HCl; Take 1 tablet (10 mg total) by mouth every 8 (eight) hours as needed.  Dispense: 270 tablet; Refill: 0  B12 deficiency -     Cyanocobalamin ; Take 1 tablet (2,000 mcg total) by mouth daily.  Dispense: 90 tablet; Refill: 0 -     Cyanocobalamin   CKD (chronic kidney disease) stage 4, GFR 15-29 ml/min (HCC) -     Basic metabolic panel with GFR; Future  Dysuria -     Urinalysis, Routine w reflex microscopic; Future -     CULTURE, URINE COMPREHENSIVE; Future  Type 2 diabetes mellitus with stage 3b chronic kidney disease, without long-term current use of insulin  (HCC) -     Basic metabolic panel with GFR; Future -     Hemoglobin A1c; Future -     Urinalysis, Routine w reflex microscopic; Future  Pruritic dermatitis -     hydrOXYzine  HCl; Take 1 tablet (10 mg total) by mouth every 8 (eight) hours as needed.  Dispense: 270 tablet; Refill: 0     Follow-up: Return in about 3 months (around 03/23/2024).  Debby Molt, MD

## 2023-12-24 LAB — BASIC METABOLIC PANEL WITH GFR
BUN: 24 mg/dL — ABNORMAL HIGH (ref 6–23)
CO2: 28 meq/L (ref 19–32)
Calcium: 9.9 mg/dL (ref 8.4–10.5)
Chloride: 103 meq/L (ref 96–112)
Creatinine, Ser: 1.44 mg/dL — ABNORMAL HIGH (ref 0.40–1.20)
GFR: 32.08 mL/min — ABNORMAL LOW (ref >60.00)
Glucose, Bld: 106 mg/dL — ABNORMAL HIGH (ref 70–99)
Potassium: 4.3 meq/L (ref 3.5–5.1)
Sodium: 138 meq/L (ref 135–145)

## 2023-12-24 LAB — URINALYSIS, ROUTINE W REFLEX MICROSCOPIC
Bilirubin Urine: NEGATIVE
Hgb urine dipstick: NEGATIVE
Ketones, ur: NEGATIVE
Leukocytes,Ua: NEGATIVE
Nitrite: NEGATIVE
Specific Gravity, Urine: 1.01 (ref 1.000–1.030)
Total Protein, Urine: NEGATIVE
Urine Glucose: >=1000 — AB
Urobilinogen, UA: 0.2 (ref 0.0–1.0)
pH: 6.5 (ref 5.0–8.0)

## 2023-12-24 LAB — HEMOGLOBIN A1C: Hgb A1c MFr Bld: 6.9 % — ABNORMAL HIGH (ref 4.6–6.5)

## 2023-12-24 NOTE — Progress Notes (Signed)
 Remote PPM Transmission

## 2023-12-25 LAB — CULTURE, URINE COMPREHENSIVE: RESULT:: NO GROWTH

## 2023-12-26 ENCOUNTER — Ambulatory Visit: Payer: Self-pay | Admitting: Internal Medicine

## 2024-01-06 ENCOUNTER — Ambulatory Visit: Admitting: Podiatry

## 2024-01-06 ENCOUNTER — Encounter: Payer: Self-pay | Admitting: Podiatry

## 2024-01-06 DIAGNOSIS — E1142 Type 2 diabetes mellitus with diabetic polyneuropathy: Secondary | ICD-10-CM

## 2024-01-06 DIAGNOSIS — M79674 Pain in right toe(s): Secondary | ICD-10-CM

## 2024-01-06 DIAGNOSIS — B351 Tinea unguium: Secondary | ICD-10-CM | POA: Diagnosis not present

## 2024-01-06 DIAGNOSIS — M79675 Pain in left toe(s): Secondary | ICD-10-CM

## 2024-01-06 NOTE — Progress Notes (Signed)
 Subjective:  Patient ID: Penny Miller, female    DOB: 1932/03/07,  MRN: [id number]  89 y.o. female presents with at risk foot care with history of diabetic neuropathy and painful thick toenails that are difficult to trim. Pain interferes with ambulation. Aggravating factors include wearing enclosed shoe gear. Pain is relieved with periodic professional debridement. Her granddaughter is present during today's visit. Chief Complaint  Patient presents with   Diabetes    She trims my toenails.  Saw Dr. Debby Molt - 12/23/2023; A1c - 6.9     PCP: Molt Debby CROME, MD.  New problem(s): None.   Review of Systems: Negative except as noted in the HPI.   Allergies  Allergen Reactions   Adhesive [Tape] Rash and Other (See Comments)    Tears the skin also   Ceclor [Cefaclor] Other (See Comments)    Unknown reaction; not recalled   Codeine Nausea And Vomiting   Jardiance  [Empagliflozin ] Diarrhea   Lopid [Gemfibrozil] Other (See Comments)    Unknown reaction; not recalled   Metformin  And Related Diarrhea   Metoprolol Nausea And Vomiting   Morphine And Codeine Other (See Comments)    Passed out   Niacin And Related Other (See Comments)    Unknown reaction   Relafen [Nabumetone] Nausea And Vomiting   Septra  [Sulfamethoxazole -Trimethoprim ] Nausea And Vomiting   Tetracyclines & Related Nausea And Vomiting    Objective:  There were no vitals filed for this visit. Constitutional Patient is a pleasant 89 y.o. female WD, WN in NAD. AAO x 3.  Vascular Capillary fill time to digits <3 seconds.  DP/PT pulse(s) are faintly palpable b/l lower extremities. Pedal hair absent b/l. Lower extremity skin temperature gradient warm to cool b/l. No pain with calf compression b/l. No cyanosis or clubbing noted. No ischemia nor gangrene noted b/l.   Neurologic Protective sensation intact 5/5 intact bilaterally with 10g monofilament b/l. Vibratory sensation intact b/l. No clonus b/l. Pt has subjective  symptoms of neuropathy.  Dermatologic Pedal skin is thin, shiny and atrophic b/l.  No open wounds b/l lower extremities. No interdigital macerations b/l lower extremities. Toenails 1-5 b/l elongated, discolored, dystrophic, thickened, crumbly with subungual debris and tenderness to dorsal palpation. No hyperkeratotic nor porokeratotic lesions present on today's visit.  Orthopedic: Normal muscle strength 5/5 to all lower extremity muscle groups bilaterally. Pes cavus deformity noted b/l lower extremities.   Last HgA1c:     Latest Ref Rng & Units 12/23/2023    4:39 PM 08/12/2023    2:59 PM  Hemoglobin A1C  Hemoglobin-A1c 4.6 - 6.5 % 6.9  6.5      Assessment:   1. Pain due to onychomycosis of toenails of both feet   2. Diabetic peripheral neuropathy (HCC)    Plan:  Consent given for treatment. Patient examined. All patient's and/or POA's questions/concerns addressed on today's visit. Toenails 1-5 debrided in length and girth without incident. Continue foot and shoe inspections daily. Monitor blood glucose per PCP/Endocrinologist's recommendations. Continue soft, supportive shoe gear daily. Report any pedal injuries to medical professional. Call office if there are any questions/concerns. -Patient/POA to call should there be question/concern in the interim.  Return in about 3 months (around 04/07/2024).  Delon CROME Merlin, DPM       LOCATION: [address]  [place], [place] [address]                   Office [phone]   [facility] LOCATION: [address] [place], [place] [address] Office [phone]

## 2024-01-22 ENCOUNTER — Other Ambulatory Visit: Payer: Self-pay | Admitting: Internal Medicine

## 2024-02-11 DIAGNOSIS — Z961 Presence of intraocular lens: Secondary | ICD-10-CM | POA: Diagnosis not present

## 2024-02-11 DIAGNOSIS — H04123 Dry eye syndrome of bilateral lacrimal glands: Secondary | ICD-10-CM | POA: Diagnosis not present

## 2024-02-13 ENCOUNTER — Ambulatory Visit: Payer: Medicare Other

## 2024-02-13 DIAGNOSIS — I495 Sick sinus syndrome: Secondary | ICD-10-CM

## 2024-02-15 ENCOUNTER — Ambulatory Visit: Payer: Self-pay | Admitting: Internal Medicine

## 2024-02-15 LAB — CUP PACEART REMOTE DEVICE CHECK
Battery Remaining Longevity: 133 mo
Battery Voltage: 3.02 V
Brady Statistic AP VP Percent: 24.07 %
Brady Statistic AP VS Percent: 68.13 %
Brady Statistic AS VP Percent: 2.1 %
Brady Statistic AS VS Percent: 5.62 %
Brady Statistic RA Percent Paced: 71.62 %
Brady Statistic RV Percent Paced: 42.17 %
Date Time Interrogation Session: 20251114000536
Implantable Lead Connection Status: 753985
Implantable Lead Connection Status: 753985
Implantable Lead Implant Date: 20240214
Implantable Lead Implant Date: 20240214
Implantable Lead Location: 753859
Implantable Lead Location: 753860
Implantable Lead Model: 3830
Implantable Lead Model: 5076
Implantable Pulse Generator Implant Date: 20240214
Lead Channel Impedance Value: 285 Ohm
Lead Channel Impedance Value: 418 Ohm
Lead Channel Impedance Value: 437 Ohm
Lead Channel Impedance Value: 570 Ohm
Lead Channel Pacing Threshold Amplitude: 0.5 V
Lead Channel Pacing Threshold Amplitude: 0.625 V
Lead Channel Pacing Threshold Pulse Width: 0.4 ms
Lead Channel Pacing Threshold Pulse Width: 0.4 ms
Lead Channel Sensing Intrinsic Amplitude: 2 mV
Lead Channel Sensing Intrinsic Amplitude: 2 mV
Lead Channel Sensing Intrinsic Amplitude: 6.375 mV
Lead Channel Sensing Intrinsic Amplitude: 6.375 mV
Lead Channel Setting Pacing Amplitude: 1.5 V
Lead Channel Setting Pacing Amplitude: 2.5 V
Lead Channel Setting Pacing Pulse Width: 0.4 ms
Lead Channel Setting Sensing Sensitivity: 1.2 mV
Zone Setting Status: 755011

## 2024-02-17 NOTE — Progress Notes (Signed)
 Remote PPM Transmission

## 2024-03-04 ENCOUNTER — Other Ambulatory Visit: Payer: Self-pay | Admitting: Internal Medicine

## 2024-03-04 DIAGNOSIS — E785 Hyperlipidemia, unspecified: Secondary | ICD-10-CM

## 2024-03-04 DIAGNOSIS — L308 Other specified dermatitis: Secondary | ICD-10-CM

## 2024-03-04 DIAGNOSIS — L2084 Intrinsic (allergic) eczema: Secondary | ICD-10-CM

## 2024-03-04 DIAGNOSIS — Z79899 Other long term (current) drug therapy: Secondary | ICD-10-CM

## 2024-03-05 ENCOUNTER — Other Ambulatory Visit: Payer: Self-pay | Admitting: Internal Medicine

## 2024-03-05 DIAGNOSIS — E559 Vitamin D deficiency, unspecified: Secondary | ICD-10-CM

## 2024-03-05 NOTE — Telephone Encounter (Signed)
 Copied from CRM #[id number]. Topic: Clinical - Medication Refill >> Mar 05, 2024  1:35 PM Jayma L wrote:  Patient's son, Sherida, is calling in to refill the following prescription. Patient is actively out of the prescription and patient's son is requesting we sent it ouyt twice. Once to the [facility] [address] for immediate pick up and the remaining to [facility] to get the remaining prescription and refills via mail order.    Medication: Vitamin D , Ergocalciferol , (DRISDOL ) 1.25 MG (50000 UNIT) CAPS capsule  Has the patient contacted their pharmacy? No (Agent: If no, request that the patient contact the pharmacy for the refill. If patient does not wish to contact the pharmacy document the reason why and proceed with request.) (Agent: If yes, when and what did the pharmacy advise?)  This is the patient's preferred pharmacy:  [facility] - [place], [place] - [address] [place] [address] Phone: [phone] Fax: [phone]   Is this the correct pharmacy for this prescription? Yes If no, delete pharmacy and type the correct one.   Has the prescription been filled recently? Yes  Is the patient out of the medication? Yes  Has the patient been seen for an appointment in the last year OR does the patient have an upcoming appointment? Yes  Can we respond through MyChart? Yes  Agent: Please be advised that Rx refills may take up to 3 business days. We ask that you follow-up with your pharmacy.

## 2024-03-05 NOTE — Telephone Encounter (Signed)
 Patient also request [facility] for future use

## 2024-03-23 ENCOUNTER — Ambulatory Visit: Admitting: Internal Medicine

## 2024-03-23 ENCOUNTER — Encounter: Payer: Self-pay | Admitting: Internal Medicine

## 2024-03-23 VITALS — BP 144/76 | HR 77 | Temp 97.7°F | Ht 67.0 in | Wt 144.4 lb

## 2024-03-23 DIAGNOSIS — R10812 Left upper quadrant abdominal tenderness: Secondary | ICD-10-CM

## 2024-03-23 DIAGNOSIS — N3020 Other chronic cystitis without hematuria: Secondary | ICD-10-CM

## 2024-03-23 DIAGNOSIS — N184 Chronic kidney disease, stage 4 (severe): Secondary | ICD-10-CM

## 2024-03-23 DIAGNOSIS — R634 Abnormal weight loss: Secondary | ICD-10-CM

## 2024-03-23 LAB — URINALYSIS, ROUTINE W REFLEX MICROSCOPIC
Bilirubin Urine: NEGATIVE
Hgb urine dipstick: NEGATIVE
Ketones, ur: NEGATIVE
Nitrite: NEGATIVE
Specific Gravity, Urine: 1.01 (ref 1.000–1.030)
Urine Glucose: >=1000 — AB
Urobilinogen, UA: 0.2 (ref 0.0–1.0)
pH: 6.5 (ref 5.0–8.0)

## 2024-03-23 LAB — BASIC METABOLIC PANEL WITH GFR
BUN: 25 mg/dL — ABNORMAL HIGH (ref 6–23)
CO2: 31 meq/L (ref 19–32)
Calcium: 10.1 mg/dL (ref 8.4–10.5)
Chloride: 103 meq/L (ref 96–112)
Creatinine, Ser: 1.43 mg/dL — ABNORMAL HIGH (ref 0.40–1.20)
GFR: 32.29 mL/min — ABNORMAL LOW
Glucose, Bld: 102 mg/dL — ABNORMAL HIGH (ref 70–99)
Potassium: 4 meq/L (ref 3.5–5.1)
Sodium: 141 meq/L (ref 135–145)

## 2024-03-23 LAB — HEPATIC FUNCTION PANEL
ALT: 11 U/L (ref 3–35)
AST: 14 U/L (ref 5–37)
Albumin: 4.3 g/dL (ref 3.5–5.2)
Alkaline Phosphatase: 77 U/L (ref 39–117)
Bilirubin, Direct: 0.1 mg/dL (ref 0.1–0.3)
Total Bilirubin: 0.5 mg/dL (ref 0.2–1.2)
Total Protein: 6.7 g/dL (ref 6.0–8.3)

## 2024-03-23 LAB — LIPASE: Lipase: 57 U/L (ref 11.0–59.0)

## 2024-03-23 LAB — AMYLASE: Amylase: 35 U/L (ref 27–131)

## 2024-03-23 NOTE — Progress Notes (Signed)
 "  Subjective:  Patient ID: Penny Miller, female    DOB: 12-May-1931  Age: 89 y.o. MRN: [id number]  CC: Medical Management of Chronic Issues (3 month follow up )   HPI Penny Miller presents for f/up --    Discussed the use of AI scribe software for clinical note transcription with the patient, who gave verbal consent to proceed.  History of Present Illness Penny Miller is a 89 year old female who presents with abdominal pain and nausea.  She experiences abdominal pain on the left side and under her ribs, ongoing for several months. The pain is sometimes severe, especially if she eats excessively or does not urinate frequently. Urination helps relieve the pressure in her abdomen.  She frequently experiences nausea without vomiting, although there was an episode where she felt like she was going to vomit but did not. Her stomach hurts, particularly on the left side, and her ribs become sore if she does not urinate.  She has variable bowel movements, sometimes normal and other times small, especially when she urinates. No constipation, but she feels there is pressure preventing normal bowel movements.  She reports chills and aches all over when she gets cold. She also experiences soreness in her back, particularly under her ribs.  She has noticed a decrease in appetite and believes she has lost weight as a result.     Outpatient Medications Prior to Visit  Medication Sig Dispense Refill   acetaminophen  (TYLENOL ) 500 MG tablet Take 500 mg by mouth every 6 (six) hours as needed (pain).     albuterol  (PROVENTIL ) (2.5 MG/3ML) 0.083% nebulizer solution Take 3 mLs (2.5 mg total) by nebulization every 6 (six) hours as needed for wheezing or shortness of breath. 150 mL 3   amLODipine  (NORVASC ) 2.5 MG tablet Take 1 tablet (2.5 mg total) by mouth at bedtime. 90 tablet 1   apixaban  (ELIQUIS ) 2.5 MG TABS tablet Take 1 tablet (2.5 mg total) by mouth 2 (two) times daily. 180 tablet 1    atorvastatin  (LIPITOR) 40 MG tablet TAKE 1 TABLET AT BEDTIME 90 tablet 3   Blood Glucose Monitoring Suppl (ONE TOUCH ULTRA 2) w/Device KIT 1 Act by Does not apply route daily. 1 kit 2   citalopram  (CELEXA ) 20 MG tablet Take 1 tablet (20 mg total) by mouth at bedtime. 90 tablet 1   cyanocobalamin  2000 MCG tablet Take 1 tablet (2,000 mcg total) by mouth daily. 90 tablet 0   dapagliflozin  propanediol (FARXIGA ) 10 MG TABS tablet TAKE 1 TABLET DAILY BEFORE BREAKFAST 90 tablet 1   famotidine  (PEPCID ) 20 MG tablet TAKE 1 TABLET ONCE TO TWICE A DAY 60 tablet 2   ferrous sulfate  325 (65 FE) MG EC tablet Take 325 mg by mouth daily with breakfast. With Vitamin C     glucose blood test strip 1 each by Other route daily. Use as instructed 100 each 5   Lancets (ONETOUCH DELICA PLUS LANCET33G) MISC 1 Act by Does not apply route daily. 100 each 5   triamcinolone  ointment (KENALOG ) 0.5 % Apply 1 Application topically 2 (two) times daily. 30 g 0   Vitamin D , Ergocalciferol , (DRISDOL ) 1.25 MG (50000 UNIT) CAPS capsule TAKE 1 CAPSULE EVERY 7 DAYS (Patient taking differently: Take 50,000 Units by mouth every 7 (seven) days.) 6 capsule 0   hydrOXYzine  (ATARAX ) 10 MG tablet TAKE 1 TABLET EVERY 8 HOURS AS NEEDED (Patient not taking: Reported on 03/23/2024) 270 tablet 3   No facility-administered medications  prior to visit.    ROS Review of Systems  Constitutional:  Positive for chills and unexpected weight change. Negative for appetite change, diaphoresis, fatigue and fever.  HENT: Negative.    Eyes: Negative.   Respiratory: Negative.  Negative for cough, chest tightness, shortness of breath and wheezing.   Cardiovascular:  Negative for chest pain, palpitations and leg swelling.  Gastrointestinal:  Positive for abdominal pain and nausea. Negative for blood in stool, constipation, diarrhea, rectal pain and vomiting.  Genitourinary: Negative.  Negative for decreased urine volume, difficulty urinating, dysuria, flank  pain, hematuria and urgency.  Musculoskeletal: Negative.   Skin: Negative.   Neurological: Negative.  Negative for dizziness and weakness.  Hematological:  Negative for adenopathy. Does not bruise/bleed easily.  Psychiatric/Behavioral: Negative.      Objective:  BP (!) 144/76 (BP Location: Left Arm, Patient Position: Sitting, Cuff Size: Normal)   Pulse 77   Temp 97.7 F (36.5 C) (Oral)   Ht 5' 7 (1.702 m)   Wt 144 lb 6.4 oz (65.5 kg)   SpO2 93%   BMI 22.62 kg/m   BP Readings from Last 3 Encounters:  03/23/24 (!) 144/76  12/23/23 (!) 150/90  11/05/23 (!) 158/68    Wt Readings from Last 3 Encounters:  03/23/24 144 lb 6.4 oz (65.5 kg)  12/23/23 149 lb 6.4 oz (67.8 kg)  12/19/23 150 lb (68 kg)    Physical Exam Vitals reviewed.  Constitutional:      General: She is not in acute distress.    Appearance: Normal appearance. She is not toxic-appearing or diaphoretic.  HENT:     Nose: Nose normal.     Mouth/Throat:     Mouth: Mucous membranes are moist.  Eyes:     General: No scleral icterus.    Conjunctiva/sclera: Conjunctivae normal.  Cardiovascular:     Rate and Rhythm: Normal rate and regular rhythm.     Heart sounds: No murmur heard.    No friction rub. No gallop.  Pulmonary:     Effort: Pulmonary effort is normal.     Breath sounds: No stridor. No wheezing, rhonchi or rales.  Abdominal:     General: Abdomen is flat. Bowel sounds are normal. There is no distension.     Palpations: Abdomen is soft. There is no hepatomegaly, splenomegaly or mass.     Tenderness: There is generalized abdominal tenderness and tenderness in the left upper quadrant. There is no guarding or rebound.     Hernia: No hernia is present.  Musculoskeletal:        General: Normal range of motion.     Cervical back: Neck supple.     Right lower leg: No edema.     Left lower leg: No edema.  Skin:    Coloration: Skin is not pale.  Neurological:     General: No focal deficit present.      Mental Status: She is alert.  Psychiatric:        Mood and Affect: Mood normal.        Behavior: Behavior normal.     Lab Results  Component Value Date   WBC 6.1 10/28/2023   HGB 14.0 10/28/2023   HCT 42.6 10/28/2023   PLT 155.0 10/28/2023   GLUCOSE 102 (H) 03/23/2024   CHOL 148 10/28/2023   TRIG 164.0 (H) 10/28/2023   HDL 48.00 10/28/2023   LDLCALC 67 10/28/2023   ALT 11 03/23/2024   AST 14 03/23/2024   NA 141 03/23/2024  K 4.0 03/23/2024   CL 103 03/23/2024   CREATININE 1.43 (H) 03/23/2024   BUN 25 (H) 03/23/2024   CO2 31 03/23/2024   TSH 2.43 10/28/2023   INR 1.2 07/16/2021   HGBA1C 6.9 (H) 12/23/2023   MICROALBUR 9.7 (H) 10/28/2023    DG Chest Portable 1 View Result Date: 05/02/2023 CLINICAL DATA:  Shortness of breath EXAM: PORTABLE CHEST 1 VIEW COMPARISON:  Chest radiograph dated 12/19/2022 FINDINGS: Lines/tubes: Left chest wall pacemaker leads project over the right atrium and ventricle. Lungs: Well inflated lungs. Left mid lung linear opacities. Pleura: No pneumothorax or pleural effusion. Heart/mediastinum: Similar  cardiomediastinal silhouette. Bones: No acute osseous abnormality. IMPRESSION: Left mid lung linear opacities, likely atelectasis/scarring. No focal consolidations. Electronically Signed   By: Limin  Xu M.D.   On: 05/02/2023 10:48   Estimated Creatinine Clearance: 25.4 mL/min (A) (by C-G formula based on SCr of 1.43 mg/dL (H)).   Anion gap is 7.0  Assessment & Plan:   Left upper quadrant abdominal tenderness without rebound tenderness- Exam and labs are not consistent with an acute process. -     Lipase; Future -     Amylase; Future -     Urinalysis, Routine w reflex microscopic; Future -     Hepatic function panel; Future -     Basic metabolic panel with GFR; Future  CKD (chronic kidney disease) stage 4, GFR 15-29 ml/min (HCC)- Will avoid nephrotoxic agents  -     Urinalysis, Routine w reflex microscopic; Future -     Basic metabolic panel with  GFR; Future  Weight loss, unintentional -     Urinalysis, Routine w reflex microscopic; Future -     Hepatic function panel; Future  Bladder infection, chronic- Clx is negative. -     Urinalysis, Routine w reflex microscopic; Future -     CULTURE, URINE COMPREHENSIVE; Future     Follow-up: Return if symptoms worsen or fail to improve.  Debby Molt, MD "

## 2024-03-23 NOTE — Patient Instructions (Signed)
 Abdominal Pain, Adult  Pain in the abdomen (abdominal pain) can be caused by many things. In most cases, it gets better with no treatment or by being treated at home. But in some cases, it can be serious. Your health care provider will ask questions about your medical history and do a physical exam to try to figure out what is causing your pain. Follow these instructions at home: Medicines Take over-the-counter and prescription medicines only as told by your provider. Do not take medicines that help you poop (laxatives) unless told by your provider. General instructions Watch your condition for any changes. Drink enough fluid to keep your pee (urine) pale yellow. Contact a health care provider if: Your pain changes, gets worse, or lasts longer than expected. You have severe cramping or bloating in your abdomen, or you vomit. Your pain gets worse with meals, after eating, or with certain foods. You are constipated or have diarrhea for more than 2-3 days. You are not hungry, or you lose weight without trying. You have signs of dehydration. These may include: Dark pee, very little pee, or no pee. Cracked lips or dry mouth. Sleepiness or weakness. You have pain when you pee (urinate) or poop. Your abdominal pain wakes you up at night. You have blood in your pee. You have a fever. Get help right away if: You cannot stop vomiting. Your pain is only in one part of the abdomen. Pain on the right side could be caused by appendicitis. You have bloody or black poop (stool), or poop that looks like tar. You have trouble breathing. You have chest pain. These symptoms may be an emergency. Get help right away. Call 911. Do not wait to see if the symptoms will go away. Do not drive yourself to the hospital. This information is not intended to replace advice given to you by your health care provider. Make sure you discuss any questions you have with your health care provider. Document Revised:  01/02/2022 Document Reviewed: 01/02/2022 Elsevier Patient Education  2024 [employer].

## 2024-03-25 LAB — CULTURE, URINE COMPREHENSIVE

## 2024-03-26 ENCOUNTER — Ambulatory Visit: Payer: Self-pay | Admitting: Internal Medicine

## 2024-03-29 ENCOUNTER — Other Ambulatory Visit: Payer: Self-pay | Admitting: Internal Medicine

## 2024-03-29 DIAGNOSIS — E1122 Type 2 diabetes mellitus with diabetic chronic kidney disease: Secondary | ICD-10-CM

## 2024-04-02 ENCOUNTER — Telehealth: Payer: Self-pay | Admitting: Internal Medicine

## 2024-04-02 NOTE — Telephone Encounter (Signed)
 Copied from CRM [id number]. Topic: Clinical - Prescription Issue >> Apr 02, 2024 11:42 AM Rea BROCKS wrote: Reason for CRM: Penny Miller is calling to verify alternatives the patient can use for her UltraOne Touch test strips. This is the last day that they can hold the request.  Preferred alternatives are  Freestyle Lite Strips in 50s and 100s and Tru Metrix Glucose Test Strips 100.  Patient will also receive a free meter if either of those are the test strips selected.   This is the last day that this can be held with one of the alternatives and can be sent electronically.   Penny Miller returned call from [employer] stating previous call with Rep was disconnected before connect with a clinical staff. Called [facility] to verify if they were previously on line to speak with someone. They weren't. They advised to route crm to further assist.

## 2024-04-05 ENCOUNTER — Telehealth: Payer: Self-pay

## 2024-04-05 NOTE — Telephone Encounter (Signed)
 Copied from CRM #[id number]. Topic: Clinical - Prescription Issue >> Apr 05, 2024  3:23 PM Deleta RAMAN wrote: Reason for CRM: maggie from [facility] calling regarding [facility] ULTRA test strip needs to speak with medical staff about patient prescription. [phone] ref [id number]

## 2024-04-06 NOTE — Telephone Encounter (Signed)
 I have corrected the script to the test strips that are covered by her insurance. Spoke with the pharmacist to give the verbal order.

## 2024-04-07 ENCOUNTER — Other Ambulatory Visit: Payer: Self-pay | Admitting: Family Medicine

## 2024-04-07 DIAGNOSIS — Z79899 Other long term (current) drug therapy: Secondary | ICD-10-CM

## 2024-04-07 NOTE — Telephone Encounter (Signed)
 Prescriptions has been given.

## 2024-04-14 ENCOUNTER — Ambulatory Visit: Admitting: Podiatry

## 2024-04-14 DIAGNOSIS — B351 Tinea unguium: Secondary | ICD-10-CM

## 2024-04-14 DIAGNOSIS — E1142 Type 2 diabetes mellitus with diabetic polyneuropathy: Secondary | ICD-10-CM | POA: Diagnosis not present

## 2024-04-14 DIAGNOSIS — M79675 Pain in left toe(s): Secondary | ICD-10-CM | POA: Diagnosis not present

## 2024-04-14 DIAGNOSIS — M79674 Pain in right toe(s): Secondary | ICD-10-CM

## 2024-04-19 ENCOUNTER — Other Ambulatory Visit: Payer: Self-pay | Admitting: Family Medicine

## 2024-04-19 DIAGNOSIS — Z79899 Other long term (current) drug therapy: Secondary | ICD-10-CM

## 2024-04-24 ENCOUNTER — Encounter: Payer: Self-pay | Admitting: Podiatry

## 2024-04-24 NOTE — Progress Notes (Signed)
 "  Subjective:  Patient ID: Penny Miller, female    DOB: 1931-05-11,  MRN: [id number]  Penny Miller presents to clinic today for at risk foot care with history of diabetic neuropathy and painful mycotic toenails x 10 which interfere with daily activities. Pain is relieved with periodic professional debridement.  Chief Complaint  Patient presents with   [facility]    [facility] Sore spot on L Dorsal  2nd met. A1c 6.9, 06/22/23. Joshua Debby CROME, MD (PCP), 03/23/24.    New problem(s): None.   PCP is Joshua Debby CROME, MD.  Allergies[1]  Review of Systems: Negative except as noted in the HPI.  Objective: No changes noted in today's physical examination. There were no vitals filed for this visit. Penny Miller is a pleasant 89 y.o. female WD, WN in NAD. AAO x 3.  Vascular Examination: CFT <3 seconds b/l. DP/PT pulses faintly palpable b/l. Digital hair absent.  Skin temperature gradient warm to warm b/l. No pain with calf compression. No ischemia or gangrene. No cyanosis or clubbing noted b/l.    Neurological Examination: Sensation grossly intact b/l with 10 gram monofilament. Vibratory sensation intact b/l. Pt has subjective symptoms of neuropathy.  Dermatological Examination: Pedal skin warm and supple b/l.   No open wounds. No interdigital macerations.  Toenails 1-5 b/l thick, discolored, elongated with subungual debris and pain on dorsal palpation.    No corns, calluses, nor porokeratotic lesions.  Musculoskeletal Examination: Muscle strength 5/5 to all lower extremity muscle groups bilaterally. No pain, crepitus or joint limitation noted with ROM bilateral LE. Pes cavus deformity noted b/l feet.  Radiographs: None Last A1c:      Latest Ref Rng & Units 12/23/2023    4:39 PM 08/12/2023    2:59 PM  Hemoglobin A1C  Hemoglobin-A1c 4.6 - 6.5 % 6.9  6.5    Assessment/Plan: 1. Pain due to onychomycosis of toenails of both feet   2. Diabetic peripheral neuropathy [facility])   Patient was  evaluated and treated. All patient's and/or POA's questions/concerns addressed on today's visit. Mycotic toenails 1-5 b/l debrided in length and girth without incident.  Continue daily foot inspections and monitor blood glucose per PCP/Endocrinologist's recommendations.Continue soft, supportive shoe gear daily. Report any pedal injuries to medical professional. Call office if there are any quesitons/concerns. -Patient/POA to call should there be question/concern in the interim.   Return in about 3 months (around 07/13/2024).  Delon CROME Merlin, DPM      [place] LOCATION: [address] [place] [address]                   Office [phone]   [place] LOCATION: [address] [place], [place] [address] Office [phone]     [1]  Allergies Allergen Reactions   Sulfamethoxazole     Trimethoprim     Adhesive [Tape] Rash and Other (See Comments)    Tears the skin also   Ceclor [Cefaclor] Other (See Comments)    Unknown reaction; not recalled   Codeine Nausea And Vomiting   Jardiance  [Empagliflozin ] Diarrhea   Lopid [Gemfibrozil] Other (See Comments)    Unknown reaction; not recalled   Metformin  And Related Diarrhea   Metoprolol Nausea And  Vomiting   Morphine And Codeine Other (See Comments)    Passed out   Niacin And Related Other (See Comments)    Unknown reaction   Relafen [Nabumetone] Nausea And Vomiting   Septra  [Sulfamethoxazole -Trimethoprim ] Nausea And Vomiting   Tetracyclines & Related Nausea And Vomiting   "

## 2024-07-20 ENCOUNTER — Ambulatory Visit: Admitting: Podiatry

## 2024-08-13 ENCOUNTER — Ambulatory Visit

## 2024-11-12 ENCOUNTER — Ambulatory Visit

## 2024-12-23 ENCOUNTER — Ambulatory Visit

## 2025-02-11 ENCOUNTER — Ambulatory Visit

## 2025-05-13 ENCOUNTER — Ambulatory Visit

## 2025-08-12 ENCOUNTER — Ambulatory Visit

## 2025-11-11 ENCOUNTER — Ambulatory Visit

## 2026-02-10 ENCOUNTER — Ambulatory Visit

## 2026-05-12 ENCOUNTER — Ambulatory Visit
